# Patient Record
Sex: Female | Born: 1965
Health system: Southern US, Community
[De-identification: ages and names within clinical notes are randomized; demographics above are authoritative.]

## PROBLEM LIST (undated history)

## (undated) DIAGNOSIS — R002 Palpitations: Secondary | ICD-10-CM

## (undated) DIAGNOSIS — K219 Gastro-esophageal reflux disease without esophagitis: Secondary | ICD-10-CM

## (undated) DIAGNOSIS — G4733 Obstructive sleep apnea (adult) (pediatric): Secondary | ICD-10-CM

## (undated) DIAGNOSIS — E7800 Pure hypercholesterolemia, unspecified: Secondary | ICD-10-CM

## (undated) DIAGNOSIS — R0602 Shortness of breath: Secondary | ICD-10-CM

## (undated) DIAGNOSIS — I10 Essential (primary) hypertension: Secondary | ICD-10-CM

## (undated) DIAGNOSIS — R42 Dizziness and giddiness: Secondary | ICD-10-CM

## (undated) DIAGNOSIS — E669 Obesity, unspecified: Secondary | ICD-10-CM

## (undated) DIAGNOSIS — N200 Calculus of kidney: Secondary | ICD-10-CM

## (undated) DIAGNOSIS — Z87442 Personal history of urinary calculi: Secondary | ICD-10-CM

## (undated) DIAGNOSIS — G629 Polyneuropathy, unspecified: Secondary | ICD-10-CM

## (undated) HISTORY — DX: Obesity, unspecified: E66.9

## (undated) HISTORY — PX: OTHER SURGICAL HISTORY: SHX169

## (undated) HISTORY — DX: Palpitations: R00.2

## (undated) HISTORY — DX: Obstructive sleep apnea (adult) (pediatric): G47.33

---

## 1986-12-10 HISTORY — PX: OTHER SURGICAL HISTORY: SHX169

## 1990-12-10 HISTORY — PX: CHOLECYSTECTOMY: SHX55

## 2001-04-22 ENCOUNTER — Ambulatory Visit (HOSPITAL_COMMUNITY): Admission: RE | Admit: 2001-04-22 | Discharge: 2001-04-22 | Payer: Self-pay | Admitting: Orthopedic Surgery

## 2001-04-22 ENCOUNTER — Encounter: Payer: Self-pay | Admitting: Orthopedic Surgery

## 2001-12-19 ENCOUNTER — Other Ambulatory Visit: Admission: RE | Admit: 2001-12-19 | Discharge: 2001-12-19 | Payer: Self-pay | Admitting: Obstetrics and Gynecology

## 2001-12-22 ENCOUNTER — Encounter: Payer: Self-pay | Admitting: Obstetrics and Gynecology

## 2001-12-22 ENCOUNTER — Ambulatory Visit (HOSPITAL_COMMUNITY): Admission: RE | Admit: 2001-12-22 | Discharge: 2001-12-22 | Payer: Self-pay | Admitting: Obstetrics and Gynecology

## 2003-01-25 ENCOUNTER — Emergency Department (HOSPITAL_COMMUNITY): Admission: EM | Admit: 2003-01-25 | Discharge: 2003-01-25 | Payer: Self-pay | Admitting: *Deleted

## 2004-01-21 ENCOUNTER — Inpatient Hospital Stay (HOSPITAL_COMMUNITY): Admission: EM | Admit: 2004-01-21 | Discharge: 2004-01-22 | Payer: Self-pay | Admitting: Emergency Medicine

## 2004-04-03 ENCOUNTER — Observation Stay (HOSPITAL_COMMUNITY): Admission: EM | Admit: 2004-04-03 | Discharge: 2004-04-04 | Payer: Self-pay | Admitting: *Deleted

## 2004-04-21 ENCOUNTER — Ambulatory Visit (HOSPITAL_BASED_OUTPATIENT_CLINIC_OR_DEPARTMENT_OTHER): Admission: RE | Admit: 2004-04-21 | Discharge: 2004-04-21 | Payer: Self-pay | Admitting: Urology

## 2004-04-21 ENCOUNTER — Ambulatory Visit (HOSPITAL_COMMUNITY): Admission: RE | Admit: 2004-04-21 | Discharge: 2004-04-21 | Payer: Self-pay | Admitting: Urology

## 2004-04-27 ENCOUNTER — Ambulatory Visit (HOSPITAL_COMMUNITY): Admission: RE | Admit: 2004-04-27 | Discharge: 2004-04-27 | Payer: Self-pay | Admitting: Urology

## 2005-04-11 ENCOUNTER — Emergency Department (HOSPITAL_COMMUNITY): Admission: EM | Admit: 2005-04-11 | Discharge: 2005-04-11 | Payer: Self-pay | Admitting: Emergency Medicine

## 2005-06-13 ENCOUNTER — Emergency Department (HOSPITAL_COMMUNITY): Admission: EM | Admit: 2005-06-13 | Discharge: 2005-06-13 | Payer: Self-pay | Admitting: *Deleted

## 2005-06-15 ENCOUNTER — Ambulatory Visit (HOSPITAL_COMMUNITY): Admission: RE | Admit: 2005-06-15 | Discharge: 2005-06-15 | Payer: Self-pay | Admitting: Family Medicine

## 2005-07-04 ENCOUNTER — Inpatient Hospital Stay (HOSPITAL_COMMUNITY): Admission: AD | Admit: 2005-07-04 | Discharge: 2005-07-06 | Payer: Self-pay | Admitting: Neurosurgery

## 2005-08-29 ENCOUNTER — Ambulatory Visit (HOSPITAL_COMMUNITY): Admission: RE | Admit: 2005-08-29 | Discharge: 2005-08-29 | Payer: Self-pay | Admitting: Neurosurgery

## 2006-09-15 ENCOUNTER — Emergency Department (HOSPITAL_COMMUNITY): Admission: EM | Admit: 2006-09-15 | Discharge: 2006-09-16 | Payer: Self-pay | Admitting: Emergency Medicine

## 2007-03-09 ENCOUNTER — Emergency Department (HOSPITAL_COMMUNITY): Admission: EM | Admit: 2007-03-09 | Discharge: 2007-03-09 | Payer: Self-pay | Admitting: Emergency Medicine

## 2007-03-20 ENCOUNTER — Emergency Department (HOSPITAL_COMMUNITY): Admission: EM | Admit: 2007-03-20 | Discharge: 2007-03-20 | Payer: Self-pay | Admitting: Emergency Medicine

## 2007-05-23 LAB — CONVERTED CEMR LAB

## 2007-06-28 ENCOUNTER — Ambulatory Visit (HOSPITAL_COMMUNITY): Admission: RE | Admit: 2007-06-28 | Discharge: 2007-06-28 | Payer: Self-pay | Admitting: Emergency Medicine

## 2007-07-12 ENCOUNTER — Emergency Department (HOSPITAL_COMMUNITY): Admission: EM | Admit: 2007-07-12 | Discharge: 2007-07-12 | Payer: Self-pay | Admitting: Emergency Medicine

## 2007-10-11 ENCOUNTER — Emergency Department (HOSPITAL_COMMUNITY): Admission: EM | Admit: 2007-10-11 | Discharge: 2007-10-12 | Payer: Self-pay | Admitting: Emergency Medicine

## 2007-11-13 ENCOUNTER — Emergency Department (HOSPITAL_COMMUNITY): Admission: EM | Admit: 2007-11-13 | Discharge: 2007-11-13 | Payer: Self-pay | Admitting: Emergency Medicine

## 2007-11-13 ENCOUNTER — Encounter: Payer: Self-pay | Admitting: Orthopedic Surgery

## 2007-11-20 ENCOUNTER — Ambulatory Visit: Payer: Self-pay | Admitting: Orthopedic Surgery

## 2007-11-20 DIAGNOSIS — M5412 Radiculopathy, cervical region: Secondary | ICD-10-CM | POA: Insufficient documentation

## 2008-01-16 ENCOUNTER — Ambulatory Visit (HOSPITAL_COMMUNITY): Admission: RE | Admit: 2008-01-16 | Discharge: 2008-01-16 | Payer: Self-pay | Admitting: Internal Medicine

## 2008-02-24 ENCOUNTER — Encounter: Payer: Self-pay | Admitting: Orthopedic Surgery

## 2008-02-26 ENCOUNTER — Telehealth: Payer: Self-pay | Admitting: Orthopedic Surgery

## 2008-02-26 ENCOUNTER — Encounter: Payer: Self-pay | Admitting: Orthopedic Surgery

## 2008-02-28 ENCOUNTER — Ambulatory Visit: Admission: RE | Admit: 2008-02-28 | Discharge: 2008-02-28 | Payer: Self-pay | Admitting: Neurology

## 2008-03-08 ENCOUNTER — Ambulatory Visit: Payer: Self-pay | Admitting: Orthopedic Surgery

## 2008-03-24 ENCOUNTER — Ambulatory Visit (HOSPITAL_COMMUNITY): Admission: RE | Admit: 2008-03-24 | Discharge: 2008-03-24 | Payer: Self-pay | Admitting: Internal Medicine

## 2008-06-21 ENCOUNTER — Emergency Department (HOSPITAL_COMMUNITY): Admission: EM | Admit: 2008-06-21 | Discharge: 2008-06-21 | Payer: Self-pay | Admitting: Emergency Medicine

## 2008-08-31 ENCOUNTER — Ambulatory Visit: Payer: Self-pay | Admitting: Family Medicine

## 2008-08-31 DIAGNOSIS — N200 Calculus of kidney: Secondary | ICD-10-CM

## 2008-08-31 DIAGNOSIS — J309 Allergic rhinitis, unspecified: Secondary | ICD-10-CM | POA: Insufficient documentation

## 2008-08-31 DIAGNOSIS — Z6838 Body mass index (BMI) 38.0-38.9, adult: Secondary | ICD-10-CM | POA: Insufficient documentation

## 2008-08-31 DIAGNOSIS — E669 Obesity, unspecified: Secondary | ICD-10-CM

## 2008-08-31 DIAGNOSIS — R350 Frequency of micturition: Secondary | ICD-10-CM | POA: Insufficient documentation

## 2008-08-31 HISTORY — DX: Calculus of kidney: N20.0

## 2008-08-31 LAB — CONVERTED CEMR LAB
Ketones, urine, test strip: NEGATIVE
Protein, U semiquant: 30
Urobilinogen, UA: 0.2
pH: 5.5

## 2008-09-01 ENCOUNTER — Encounter (INDEPENDENT_AMBULATORY_CARE_PROVIDER_SITE_OTHER): Payer: Self-pay | Admitting: Family Medicine

## 2008-09-02 LAB — CONVERTED CEMR LAB
ALT: 14 units/L (ref 0–35)
AST: 13 units/L (ref 0–37)
Alkaline Phosphatase: 101 units/L (ref 39–117)
Basophils Absolute: 0 10*3/uL (ref 0.0–0.1)
Basophils Relative: 1 % (ref 0–1)
Chloride: 104 meq/L (ref 96–112)
Cholesterol: 185 mg/dL (ref 0–200)
Creatinine, Ser: 0.7 mg/dL (ref 0.40–1.20)
Eosinophils Absolute: 0.1 10*3/uL (ref 0.0–0.7)
Eosinophils Relative: 2 % (ref 0–5)
HCT: 39.3 % (ref 36.0–46.0)
Lymphocytes Relative: 19 % (ref 12–46)
Lymphs Abs: 1.5 10*3/uL (ref 0.7–4.0)
MCV: 86.6 fL (ref 78.0–100.0)
Potassium: 4.8 meq/L (ref 3.5–5.3)
RBC: 4.54 M/uL (ref 3.87–5.11)
TSH: 4.326 microintl units/mL (ref 0.350–4.50)
Total Bilirubin: 0.6 mg/dL (ref 0.3–1.2)
VLDL: 36 mg/dL (ref 0–40)
WBC: 8.1 10*3/uL (ref 4.0–10.5)

## 2008-09-08 ENCOUNTER — Encounter (INDEPENDENT_AMBULATORY_CARE_PROVIDER_SITE_OTHER): Payer: Self-pay | Admitting: Family Medicine

## 2008-09-14 ENCOUNTER — Ambulatory Visit: Payer: Self-pay | Admitting: Family Medicine

## 2008-09-17 ENCOUNTER — Telehealth (INDEPENDENT_AMBULATORY_CARE_PROVIDER_SITE_OTHER): Payer: Self-pay | Admitting: Family Medicine

## 2008-09-22 ENCOUNTER — Encounter (INDEPENDENT_AMBULATORY_CARE_PROVIDER_SITE_OTHER): Payer: Self-pay | Admitting: Family Medicine

## 2008-09-27 ENCOUNTER — Other Ambulatory Visit: Admission: RE | Admit: 2008-09-27 | Discharge: 2008-09-27 | Payer: Self-pay | Admitting: Obstetrics & Gynecology

## 2008-09-29 ENCOUNTER — Ambulatory Visit: Payer: Self-pay | Admitting: Family Medicine

## 2008-09-29 DIAGNOSIS — E782 Mixed hyperlipidemia: Secondary | ICD-10-CM | POA: Insufficient documentation

## 2008-09-29 DIAGNOSIS — E119 Type 2 diabetes mellitus without complications: Secondary | ICD-10-CM | POA: Insufficient documentation

## 2008-09-29 DIAGNOSIS — E785 Hyperlipidemia, unspecified: Secondary | ICD-10-CM | POA: Insufficient documentation

## 2008-09-29 LAB — CONVERTED CEMR LAB: Cholesterol, target level: 200 mg/dL

## 2008-10-01 ENCOUNTER — Ambulatory Visit (HOSPITAL_COMMUNITY): Admission: RE | Admit: 2008-10-01 | Discharge: 2008-10-01 | Payer: Self-pay | Admitting: Obstetrics & Gynecology

## 2008-10-04 ENCOUNTER — Telehealth (INDEPENDENT_AMBULATORY_CARE_PROVIDER_SITE_OTHER): Payer: Self-pay | Admitting: *Deleted

## 2008-10-06 ENCOUNTER — Telehealth (INDEPENDENT_AMBULATORY_CARE_PROVIDER_SITE_OTHER): Payer: Self-pay | Admitting: *Deleted

## 2008-10-11 ENCOUNTER — Ambulatory Visit (HOSPITAL_COMMUNITY): Admission: RE | Admit: 2008-10-11 | Discharge: 2008-10-11 | Payer: Self-pay | Admitting: Obstetrics & Gynecology

## 2008-10-13 ENCOUNTER — Ambulatory Visit: Payer: Self-pay | Admitting: Family Medicine

## 2008-10-21 ENCOUNTER — Ambulatory Visit: Payer: Self-pay | Admitting: Family Medicine

## 2008-10-29 ENCOUNTER — Ambulatory Visit: Payer: Self-pay | Admitting: Family Medicine

## 2008-10-29 DIAGNOSIS — F341 Dysthymic disorder: Secondary | ICD-10-CM | POA: Insufficient documentation

## 2008-10-29 LAB — CONVERTED CEMR LAB: Hgb A1c MFr Bld: 6.6 %

## 2008-11-12 ENCOUNTER — Encounter (INDEPENDENT_AMBULATORY_CARE_PROVIDER_SITE_OTHER): Payer: Self-pay | Admitting: Family Medicine

## 2009-04-11 ENCOUNTER — Ambulatory Visit: Payer: Self-pay | Admitting: Orthopedic Surgery

## 2009-04-15 ENCOUNTER — Telehealth: Payer: Self-pay | Admitting: Orthopedic Surgery

## 2009-04-15 ENCOUNTER — Ambulatory Visit: Payer: Self-pay | Admitting: Orthopedic Surgery

## 2009-04-15 ENCOUNTER — Ambulatory Visit (HOSPITAL_COMMUNITY): Admission: RE | Admit: 2009-04-15 | Discharge: 2009-04-15 | Payer: Self-pay | Admitting: Orthopedic Surgery

## 2009-04-18 ENCOUNTER — Telehealth: Payer: Self-pay | Admitting: Orthopedic Surgery

## 2009-04-18 ENCOUNTER — Encounter: Payer: Self-pay | Admitting: Orthopedic Surgery

## 2009-04-19 ENCOUNTER — Ambulatory Visit: Payer: Self-pay | Admitting: Orthopedic Surgery

## 2009-04-27 ENCOUNTER — Ambulatory Visit: Payer: Self-pay | Admitting: Orthopedic Surgery

## 2009-05-04 ENCOUNTER — Encounter: Payer: Self-pay | Admitting: Orthopedic Surgery

## 2009-05-04 ENCOUNTER — Encounter (HOSPITAL_COMMUNITY): Admission: RE | Admit: 2009-05-04 | Discharge: 2009-06-03 | Payer: Self-pay | Admitting: Orthopedic Surgery

## 2009-05-11 ENCOUNTER — Encounter: Payer: Self-pay | Admitting: Orthopedic Surgery

## 2009-05-12 ENCOUNTER — Ambulatory Visit (HOSPITAL_COMMUNITY): Admission: RE | Admit: 2009-05-12 | Discharge: 2009-05-12 | Payer: Self-pay | Admitting: Obstetrics and Gynecology

## 2009-05-23 ENCOUNTER — Ambulatory Visit: Payer: Self-pay | Admitting: Orthopedic Surgery

## 2009-06-01 ENCOUNTER — Emergency Department (HOSPITAL_COMMUNITY): Admission: EM | Admit: 2009-06-01 | Discharge: 2009-06-01 | Payer: Self-pay | Admitting: Emergency Medicine

## 2009-09-27 ENCOUNTER — Emergency Department (HOSPITAL_COMMUNITY): Admission: EM | Admit: 2009-09-27 | Discharge: 2009-09-27 | Payer: Self-pay | Admitting: Emergency Medicine

## 2009-11-23 ENCOUNTER — Ambulatory Visit (HOSPITAL_COMMUNITY): Admission: RE | Admit: 2009-11-23 | Discharge: 2009-11-23 | Payer: Self-pay | Admitting: Obstetrics and Gynecology

## 2010-06-22 ENCOUNTER — Emergency Department (HOSPITAL_COMMUNITY): Admission: EM | Admit: 2010-06-22 | Discharge: 2010-06-23 | Payer: Self-pay | Admitting: Emergency Medicine

## 2010-11-27 ENCOUNTER — Ambulatory Visit (HOSPITAL_COMMUNITY)
Admission: RE | Admit: 2010-11-27 | Discharge: 2010-11-27 | Payer: Self-pay | Source: Home / Self Care | Attending: Obstetrics and Gynecology | Admitting: Obstetrics and Gynecology

## 2010-12-14 ENCOUNTER — Emergency Department (HOSPITAL_COMMUNITY)
Admission: EM | Admit: 2010-12-14 | Discharge: 2010-12-14 | Payer: Self-pay | Source: Home / Self Care | Admitting: Emergency Medicine

## 2010-12-14 ENCOUNTER — Encounter: Payer: Self-pay | Admitting: Cardiology

## 2010-12-14 LAB — BASIC METABOLIC PANEL
BUN: 8 mg/dL (ref 6–23)
CO2: 25 mEq/L (ref 19–32)
Calcium: 8.4 mg/dL (ref 8.4–10.5)
Chloride: 104 mEq/L (ref 96–112)
Creatinine, Ser: 0.73 mg/dL (ref 0.4–1.2)
GFR calc Af Amer: >60 mL/min (ref >60)
GFR calc non Af Amer: >60 mL/min (ref >60)
Glucose, Bld: 138 mg/dL — ABNORMAL HIGH (ref 70–99)
Potassium: 3.7 mEq/L (ref 3.5–5.1)
Sodium: 137 mEq/L (ref 135–145)

## 2010-12-14 LAB — CBC
HCT: 34.6 % — ABNORMAL LOW (ref 36.0–46.0)
Hemoglobin: 11.7 g/dL — ABNORMAL LOW (ref 12.0–15.0)
MCH: 27.9 pg (ref 26.0–34.0)
MCHC: 33.8 g/dL (ref 30.0–36.0)
MCV: 82.4 fL (ref 78.0–100.0)
Platelets: 297 10*3/uL (ref 150–400)
RBC: 4.2 MIL/uL (ref 3.87–5.11)
RDW: 13.7 % (ref 11.5–15.5)
WBC: 6.4 10*3/uL (ref 4.0–10.5)

## 2010-12-14 LAB — POCT CARDIAC MARKERS
CKMB, poc: <1 ng/mL — ABNORMAL LOW (ref 1.0–8.0)
CKMB, poc: <1 ng/mL — ABNORMAL LOW (ref 1.0–8.0)
Myoglobin, poc: 35 ng/mL (ref 12–200)
Myoglobin, poc: 31 ng/mL (ref 12–200)
Troponin i, poc: <0.05 ng/mL (ref 0.00–0.09)
Troponin i, poc: <0.05 ng/mL (ref 0.00–0.09)

## 2010-12-14 LAB — DIFFERENTIAL
Basophils Absolute: 0 10*3/uL (ref 0.0–0.1)
Basophils Relative: 1 % (ref 0–1)
Eosinophils Absolute: 0.1 10*3/uL (ref 0.0–0.7)
Eosinophils Relative: 2 % (ref 0–5)
Lymphocytes Relative: 25 % (ref 12–46)
Lymphs Abs: 1.6 10*3/uL (ref 0.7–4.0)
Monocytes Absolute: 0.7 10*3/uL (ref 0.1–1.0)
Monocytes Relative: 10 % (ref 3–12)
Neutro Abs: 4 10*3/uL (ref 1.7–7.7)
Neutrophils Relative %: 62 % (ref 43–77)

## 2010-12-14 LAB — CONVERTED CEMR LAB
CO2: 25 meq/L
Creatinine, Ser: 0.73 mg/dL
HCT: 34.6 %
Sodium: 137 meq/L

## 2010-12-18 ENCOUNTER — Emergency Department (HOSPITAL_COMMUNITY)
Admission: EM | Admit: 2010-12-18 | Discharge: 2010-12-18 | Payer: Self-pay | Source: Home / Self Care | Admitting: Internal Medicine

## 2010-12-30 ENCOUNTER — Encounter: Payer: Self-pay | Admitting: Obstetrics and Gynecology

## 2010-12-31 ENCOUNTER — Encounter: Payer: Self-pay | Admitting: Family Medicine

## 2010-12-31 ENCOUNTER — Encounter: Payer: Self-pay | Admitting: Neurology

## 2010-12-31 ENCOUNTER — Encounter: Payer: Self-pay | Admitting: Obstetrics and Gynecology

## 2011-01-17 ENCOUNTER — Encounter (INDEPENDENT_AMBULATORY_CARE_PROVIDER_SITE_OTHER): Payer: Self-pay | Admitting: *Deleted

## 2011-01-17 ENCOUNTER — Ambulatory Visit (INDEPENDENT_AMBULATORY_CARE_PROVIDER_SITE_OTHER): Payer: PRIVATE HEALTH INSURANCE | Admitting: Cardiology

## 2011-01-17 ENCOUNTER — Encounter: Payer: Self-pay | Admitting: Cardiology

## 2011-01-17 DIAGNOSIS — R079 Chest pain, unspecified: Secondary | ICD-10-CM

## 2011-01-18 ENCOUNTER — Encounter: Payer: Self-pay | Admitting: Cardiology

## 2011-01-25 NOTE — Letter (Signed)
Summary: Stress Echocardiogram Information Sheet  [facility] at [facility]  [address], [place] [address]   Phone: [phone]  Fax: [phone]      January 17, 2011 MRN: [id number] light prior to the test.   [name]  Doctor: Appointment Date: Appointment Time: Appointment Location: [facility]  Stress Echocardiogram Information Sheet    Instructions:   1. DO NOT  take your AM medicine THE MORNING  before the test.  2. NOTHING TO EAT OR DRINK AFTER MIDNIGHT  3. Dress prepared to exercise.  4. DO NOT use ANY caffine or tobacco products 3 hours before appointment.  5. Report to the Short Stay Center on the1st floor.  6. Please bring all current prescription medications.  7. If you have any questions, please call [phone]

## 2011-01-31 NOTE — Assessment & Plan Note (Signed)
Summary: [facility]   Visit Type:  Initial Consult Primary Provider:  Dr. Marquis Alvarado   History of Present Illness: [name] is seen in the office at the kind request of Dr. Marquis Alvarado for evaluation of chest discomfort.  This nice woman has enjoyed generally good health.  She has experienced chest discomfort severe enough to prompt evaluation in the emergency department on at least 2 occasions.  Myocardial infarction was ruled out.  She has not previously been referred to a cardiologist nor undergone any significant cardiac testing.    Prior records were obtained and reviewed.  She has multiple emergency department visits, some for chest discomfort, some for abdominal discomfort and some for headache.  No significant pathology has been apparent at any of her visits.  Her episodes of chest discomfort have been unassociated with exertion.  She develops the sudden onset of severe anterior chest pain with radiation to the legs and arm and associated dyspnea, diaphoresis and lightheadedness.  Minimal nausea has been present.  Symptoms had not resolved at the time of her most recent presentation to the emergency department.  Current Medications (verified): 1)  Metformin Hcl 500 Mg Tabs (Metformin Hcl) .... Take 1 Tab Two Times A Day  Allergies (verified): 1)  Codeine 2)  Penicillin 3)  Sulfa 4)  Morphine  Comments:  Nurse/Medical Assistant: patient is only on 1 med metfromin 500 mg two times a day [facility] is patients pharmacy  Past History:  Family History: Last updated: 01/17/2011 Father-71-A&W Mother-70-DM, HTN, hx breast cancer; cardiac disease Siblings: One sister who is alive and well at age 52 Children: 75 year old son and 32-year-old daughter, both healthy  Family history of diabetes and coronary artery disease  Social History: Last updated: 01/17/2011 [employer]; adm. tasks in a warehouse Single-lives with 2 children Never  Smoked Alcohol use-no Drug use-no Marital-divorced  Past Medical History: Chest pain Diabetes-2011 Obesity with abnormal sleep study Nephrolithiasis Anxiety Headache ALLERGIC RHINITIS (ICD-477.9) CERVICAL RADICULITIS (ICD-723.4)  Past Surgical History: C-Section x2-1994/2000 Cholecystectomy Removal of bone spur from right foot-1998 Sinus surgery-1993 - hx of sinus infections Lithotripsy for nephrolithiasis -2008  Left carpal tunnel release-04/2009 Conization of cervix for dysplasia  Sleep Study  Procedure date:  02/28/2008  Findings:       IMPRESSION:  Mild rapid eye movement-related obstructive sleep apnea syndrome, but with moderate desaturation.      RECOMMENDATIONS:  Nocturnal oxygen 2 liters.            [name], M.D.   CT Abdomen/Pelvis  Procedure date:  03/24/2008  Findings:       Clinical Data:  Right-sided abdominal and pelvic pain.  Nausea.   Prior cholecystectomy. IMPRESSION:   7 mm calculus at the right ureteropelvic junction, causing mild   right hydronephrosis. Stable small periumbilical hernia containing only fat.  No acute   findings within pelvis.    CT of Chest  Procedure date:  01/16/2008  Findings:         Normal appearance of aorta on precontrast images.   Prior cholecystectomy.   Tiny duodenal diverticulum.   No thoracic adenopathy or acute bone findings.    Following contrast, normal aortic enhancement identified without   aneurysm or dissection.   Pulmonary arteries patent.   Minimal residual thymic tissue anterior mediastinum.   Minimal peribronchial thickening.   Lungs otherwise clear.   No pleural effusion or pneumothorax.    IMPRESSION:   Negative CT chest.    Read  By:  [name],  M.D.  EKG  Procedure date:  01/17/2011  Findings:      Normal sinus rhythm Within normal limits No previous tracing for comparison.  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  Vital Signs:  Patient profile:   45 year old female Height:      60 inches Weight:      215 pounds BMI:     42.14 O2 Sat:      96 % on Room air Pulse rate:   84 / minute BP sitting:   117 / 66  (left arm)  Vitals Entered By: [name] Saa, CNA (January 17, 2011 2:09 PM)  Nutrition Counseling: Patient's BMI is greater than 25 and therefore counseled on weight management options.  O2 Flow:  Room air  Physical Exam  General:  Obese; well-developed; no acute distress: HEENT-[name]/AT; PERRL; EOM intact; conjunctiva and lids nl:  Neck-No JVD; no carotid bruits: Endocrine-No thyromegaly: Lungs-No tachypnea, clear without rales, rhonchi or wheezes: CV-normal PMI; normal S1 and S2; modest basilar systolic ejection murmur Abdomen-BS normal; soft and non-tender without masses or organomegaly: MS-No deformities, cyanosis or clubbing: Neurologic-Nl cranial nerves; symmetric strength and tone: Skin- Warm, no sig. lesions: Extremities-Nl distal pulses; no edema    Impression & Recommendations:  Problem # 1:  CHEST PAIN (ICD-786.50) Chest discomfort is atypical with a pattern of infrequent but intense episodes.  The  patient also describes some residual aching that occurs more commonly.  I doubt that these symptoms represents coronary disease in this premenopausal woman, but we will proceed with a stress echocardiogram.  Problem # 2:  HYPERLIPIDEMIA (ICD-272.4) Lipid profile obtained a few years ago shows no substantial hyperlipidemia; however, in the presence of newly diagnosed diabetes, treatment with a statin would be reasonable.  CHOL: 185 (09/01/2008)   LDL: 105 (09/01/2008)   HDL: 44 (09/01/2008)   TG: 182 (09/01/2008)  Problem # 3:  OBESITY (ICD-278.00) Bariatric surgery discussed with patient.  She is not interested in that approach to obesity at this time.  Other Orders: Stress Echo (Stress Echo)  Patient Instructions: 1)  Your physician recommends that you schedule a follow-up appointment in: 3 MONTHS 2)  Your physician has recommended you make the following change in your medication: NITROGLYCERIN 0.4MG  UNDER TONGUE EVERY 5 MINUTES X3 FOR CHEST PAIN 3)  Your physician has requested that you have a stress echocardiogram. For further information please visit [email or web address]  Please follow instruction sheet as given. Prescriptions: NITROSTAT 0.4 MG SUBL (NITROGLYCERIN) 1 tablet under tongue at onset of chest  pain; you may repeat every 5 minutes for up to 3 doses.  #25 x 3   Entered by:   Teressa Lower RN   Authorized by:   Kathlen Brunswick, MD, [facility]   Signed by:   Teressa Lower RN on 01/17/2011   Method used:   Electronically to        [facility] (retail)       [address]       [place], [place]  [address]       Ph: [phone] or [phone]       Fax: [phone]   RxID:   [id number]

## 2011-02-06 ENCOUNTER — Ambulatory Visit (HOSPITAL_COMMUNITY)
Admission: RE | Admit: 2011-02-06 | Discharge: 2011-02-06 | Disposition: A | Discharge status: Home / Self Care | Payer: PRIVATE HEALTH INSURANCE | Source: Ambulatory Visit | Attending: Cardiology | Admitting: Cardiology

## 2011-02-06 ENCOUNTER — Ambulatory Visit (HOSPITAL_COMMUNITY): Payer: PRIVATE HEALTH INSURANCE | Attending: Cardiology

## 2011-02-06 ENCOUNTER — Encounter: Payer: Self-pay | Admitting: Cardiology

## 2011-02-06 DIAGNOSIS — R079 Chest pain, unspecified: Secondary | ICD-10-CM | POA: Insufficient documentation

## 2011-02-06 DIAGNOSIS — R0609 Other forms of dyspnea: Secondary | ICD-10-CM | POA: Insufficient documentation

## 2011-02-06 DIAGNOSIS — R0989 Other specified symptoms and signs involving the circulatory and respiratory systems: Secondary | ICD-10-CM | POA: Insufficient documentation

## 2011-02-06 DIAGNOSIS — R072 Precordial pain: Secondary | ICD-10-CM

## 2011-02-09 ENCOUNTER — Telehealth (INDEPENDENT_AMBULATORY_CARE_PROVIDER_SITE_OTHER): Payer: Self-pay | Admitting: *Deleted

## 2011-02-15 ENCOUNTER — Telehealth (INDEPENDENT_AMBULATORY_CARE_PROVIDER_SITE_OTHER): Payer: Self-pay | Admitting: *Deleted

## 2011-02-20 NOTE — Progress Notes (Signed)
Summary: test results  Phone Note Call from Patient Call back at Home Phone [phone]   Caller: pt Reason for Call: Talk to Nurse, Lab or Test Results Summary of Call: pt has test done last week calling for results Initial call taken by: Faythe Ghee,  February 15, 2011 4:27 PM  Follow-up for Phone Call        Results called to pt, she verbalized understanding of results Follow-up by: Teressa Lower RN,  February 15, 2011 4:58 PM

## 2011-02-20 NOTE — Progress Notes (Signed)
Summary: stress echo results  Phone Note Other Incoming   Caller: patient Reason for Call: Discuss lab or test results Summary of Call: patient called and would like a phone call back about her stress echo results thankyou Diala Waxman phone number is [phone] dob is [birth year] Initial call taken by: Dreama Saa, CNA,  February 09, 2011 8:16 AM  Follow-up for Phone Call        Pt. advised that Dr. Dietrich Pates has not seen report yet. Follow-up by: Larita Fife Via LPN,  February 09, 2011 10:13 AM

## 2011-02-24 LAB — URINE MICROSCOPIC-ADD ON

## 2011-02-24 LAB — URINALYSIS, ROUTINE W REFLEX MICROSCOPIC
Nitrite: POSITIVE — AB
Specific Gravity, Urine: 1.02 (ref 1.005–1.030)
pH: 6 (ref 5.0–8.0)

## 2011-03-19 LAB — URINE MICROSCOPIC-ADD ON

## 2011-03-19 LAB — URINALYSIS, ROUTINE W REFLEX MICROSCOPIC
Bilirubin Urine: NEGATIVE
Glucose, UA: NEGATIVE mg/dL
Ketones, ur: NEGATIVE mg/dL
Specific Gravity, Urine: 1.025 (ref 1.005–1.030)
pH: 6 (ref 5.0–8.0)

## 2011-03-20 LAB — BASIC METABOLIC PANEL
Chloride: 105 mEq/L (ref 96–112)
Creatinine, Ser: 0.63 mg/dL (ref 0.4–1.2)
GFR calc Af Amer: >60 mL/min (ref >60)
GFR calc non Af Amer: >60 mL/min (ref >60)
Potassium: 4.3 mEq/L (ref 3.5–5.1)

## 2011-03-20 LAB — HEMOGLOBIN AND HEMATOCRIT, BLOOD: Hemoglobin: 12.1 g/dL (ref 12.0–15.0)

## 2011-03-20 LAB — GLUCOSE, CAPILLARY: Glucose-Capillary: 116 mg/dL — ABNORMAL HIGH (ref 70–99)

## 2011-03-20 LAB — HCG, QUANTITATIVE, PREGNANCY: hCG, Beta Chain, Quant, S: <2 m[IU]/mL (ref <5)

## 2011-04-24 NOTE — Op Note (Signed)
[name], DROGE NO.:  [id number]   MEDICAL RECORD NO.:  [id number]          PATIENT TYPE:  AMB   LOCATION:  DAY                           FACILITY:  [facility]   PHYSICIAN:  Vickki Hearing, M.D.DATE OF BIRTH:  1966-11-16   DATE OF PROCEDURE:  04/15/2009  DATE OF DISCHARGE:                               OPERATIVE REPORT   HISTORY:  This is a 45 year old female, who has had carpal tunnel  syndrome for several years actually and had a positive carpal tunnel  test in March 2009.  She was treated with Advil, Neurontin, and vitamin  B6; and was lost to follow up for you but presents back with increasing  symptoms with and wishes to have carpal tunnel release.   PREOPERATIVE DIAGNOSIS:  Left carpal tunnel syndrome.   POSTOPERATIVE DIAGNOSIS:  Left carpal tunnel syndrome.   PROCEDURE:  Left carpal tunnel release.   SURGEON:  Vickki Hearing, MD   There were no assistants.   ANESTHESIA:  Bier block.   OPERATIVE FINDINGS:  Compressed, discolored, gray-appearing median  nerve.  No lesions in the carpal tunnel.   COMPLICATIONS:  None.   COUNTS:  Correct.   The patient was in stable condition at the end of procedure.   PROCEDURE IN DETAIL:  Procedure was done as follows.  The patient was  identified in the preop area.  Site marking and chart update were  performed.   She was taken to surgery, had a Bier block.   She had her left upper extremity prepped with DuraPrep and draped  sterilely.  Time-out procedure was then completed.   A midline incision was made in line with the radial border of the ring  finger.  Subcutaneous tissue was divided.  Palmar fascia was then  bluntly dissected and divided.  Distal portion of the carpal tunnel was  identified and a blunt instrument was passed beneath the carpal tunnel  and then a sharp instrument was used to release the transverse carpal  ligament.  The contents of the carpal tunnel were inspected.  The  wound  was irrigated and closed with 3-0 nylon suture, then injected with 0.5%  plain Marcaine with no epinephrine.  Sterile bandage was applied.  Tourniquet was released.  Fingers looked very good with good color and  capillary refill.   The patient was discharged to the PACU.  A followup is scheduled for  Tuesday.   Discharged with Darvocet-N 100 one q.4 p.r.n. for pain.      Vickki Hearing, M.D.  Electronically Signed     SEH/MEDQ  D:  04/15/2009  T:  04/16/2009  Job:  [id number]

## 2011-04-24 NOTE — Procedures (Signed)
[name], [name]            ACCOUNT NO.:  [id number]   MEDICAL RECORD NO.:  [id number]          PATIENT TYPE:  OUT   LOCATION:  SLEE                          FACILITY:  [facility]   PHYSICIAN:  Kofi A. Gerilyn Pilgrim, M.D. DATE OF BIRTH:  [birth year]   DATE OF PROCEDURE:  02/28/2008  DATE OF DISCHARGE:  02/28/2008                             SLEEP DISORDER REPORT   PROCEDURE:  Polysomnography.   OPERATOR:  Dr. Darleen Crocker A. [place].   INDICATIONS FOR PROCEDURE:  A 45 year old who presents with snoring,  hypersomnia and is being evaluated for possible obstructive sleep apnea  syndrome.   MEDICATIONS:  1. Xanax.  2. Neurontin.  3. Butalbital.   EPWORTH SLEEPINESS SCALE:  Is 13.  BMI is 32.   SLEEP STAGES:  Total recording time of 405 minutes.  Sleep efficiency  92.8%.  Sleep latency 11.5.  REM latency 102 minutes.  Stage I:  6%.  Stage II:  62%  Stage III:  28%.  REM sleep:  23%.   RESPIRATORY SUMMARY:  Baseline oxygen saturation 98%.  Lowest  desaturation 72% during REM.  Lowest non-REM saturation 83%.  The  patient had an AHI of 9.  The events occurred almost exclusively during  REM sleep with a REM AHI of 33.  Non-REM AHI is 0.4.   LIMB MOVEMENT SUMMARY:  No nocturnal leg jerks observed.   EKG SUMMARY:  Average heart rate 75.   IMPRESSION:  Mild rapid eye movement-related obstructive sleep apnea  syndrome, but with moderate desaturation.   RECOMMENDATIONS:  Nocturnal oxygen 2 liters.     Kofi A. Gerilyn Pilgrim, M.D.  Electronically Signed    KAD/MEDQ  D:  03/02/2008  T:  03/03/2008  Job:  [id number]

## 2011-04-27 NOTE — Group Therapy Note (Signed)
[name], [name]                           ACCOUNT NO.:  [id number]   MEDICAL RECORD NO.:  [id number]                   PATIENT TYPE:  INP   LOCATION:  A320                                 FACILITY:  [facility]   PHYSICIAN:  Scott A. Gerda Diss, M.D.               DATE OF BIRTH:  02-02-1966   DATE OF PROCEDURE:  01/22/2004  DATE OF DISCHARGE:                                   PROGRESS NOTE   SUMMARY:  The patient overall doing a little bit better but she states she  is not doing better.  She certainly looks comfortable, does not appear in  any severe distress.  Lungs are clear, heart is regular, abdomen is soft, right-side flank  tenderness.   ASSESSMENT AND PLAN:  Right flank pain - CT scan did not show any  hydronephrosis.  I really feel the patient should be able to be treated at  home with outpatient follow-up with urology but will have urology go ahead  and see the patient today.  Await their findings.      ___________________________________________                                            Jonna Coup Gerda Diss, M.D.   SAL/MEDQ  D:  01/22/2004  T:  01/22/2004  Job:  [id number]

## 2011-04-27 NOTE — H&P (Signed)
[name], [name]                           ACCOUNT NO.:  [id number]   MEDICAL RECORD NO.:  [id number]                   PATIENT TYPE:  INP   LOCATION:  A320                                 FACILITY:  [facility]   PHYSICIAN:  Scott A. Gerda Diss, M.D.               DATE OF BIRTH:  03-31-1966   DATE OF ADMISSION:  01/21/2004  DATE OF DISCHARGE:                                HISTORY & PHYSICAL   CHIEF COMPLAINT:  Right flank pain.   HISTORY OF PRESENT ILLNESS:  A 45 year old white female, presents with  sudden onset of severe right flank pain and discomfort with nausea.  It  happened at 3 a.m. on the 11th.  Was unable to get comfortable.  Denied  breaking out in a sweat.  Denied vomiting.  No diarrhea, no dysuria.  Patient states the pain became worse as the day went on and she presented to  the emergency department.  She denied any vaginal bleeding.   PAST MEDICAL/SURGICAL HISTORY:  1. Borderline diabetes.  2. Previous sinus surgery.  3. Foot surgery.  4. C-sections.   SOCIAL HISTORY:  Divorced, two children.  Patient does not smoke or drink.   FAMILY HISTORY:  Breast cancer, hypertension, diabetes.   ALLERGIES:  CODEINE causes disorientation.  SULFA causes rash.   CURRENT MEDICATIONS:  Over-the-counter medications only.   REVIEW OF SYSTEMS:  Negative for headaches, blurred vision, sore throat,  cough.  Negative for joint discomfort, rash.  Negative for swelling in the  legs.   LABORATORY DATA:  White count 9.4, hemoglobin 13.3, neutrophils 72.  Sodium  137, potassium 4.0, glucose 116.  BUN 9, creatinine 0.6.  Urinalysis  negative for color, just being yellow, HCG negative, specific gravity 1.030,  blood was moderate, RBC's 11 and 20, bacteria, many, few WBC's.   STUDIES:  CT scan of the abdomen showed bilateral renal calculi, 7 mm  diameter on the right side without hydronephrosis.  Small one, 5 mm, on the  left side.  Normal appendix.  Slightly prominent right ovary versus  left.  Ultrasound showed a simple cyst on the right side.   PHYSICAL EXAMINATION:  HEENT:  Benign.  NECK:  No masses, supple.  CHEST:  Clear to percussion, auscultation.  HEART:  Regular.  ABDOMEN:  Soft.  Moderate right flank tenderness.  No guarding or rebound.  EXTREMITIES:  No edema.  NEUROLOGIC:  Grossly normal.   ASSESSMENT/PLAN:  Right flank pain-patient was given Toradol in the ER.  The  patient states her pain was severe, very uncomfortable.  Was given IV  Dilaudid.  Was admitted for IV fluids.  Will consult urology.     ___________________________________________                                         Lorin Picket  A. Gerda Diss, M.D.   SAL/MEDQ  D:  01/22/2004  T:  01/22/2004  Job:  [id number]

## 2011-04-27 NOTE — Discharge Summary (Signed)
[name], [name]            ACCOUNT NO.:  [id number]   MEDICAL RECORD NO.:  [id number]          PATIENT TYPE:  OIB   LOCATION:  3018                         FACILITY:  [facility]   PHYSICIAN:  Coletta Memos, M.D.     DATE OF BIRTH:  1966/08/09   DATE OF ADMISSION:  07/04/2005  DATE OF DISCHARGE:  07/06/2005                                 DISCHARGE SUMMARY   ADMISSION DIAGNOSIS:  Displaced disk, right L4-5.   PROCEDURE:  Right L4-5 semi-hemilaminectomy and diskectomy with micro  diskectomy. Surgeon, Coletta Memos, M.D.   COMPLICATIONS:  None.   DISCHARGE STATUS:  Alive and well.   DISCHARGE DESTINATION:  Home.   MEDICATIONS:  1.  Decadron taper 4 mg p.o. b.i.d. for two days; 4 mg p.o. daily for two      days.  2.  Flexeril for muscle spasms and Vicodin for pain one to two p.o. q.6h.      p.r.n. pain.   INDICATIONS:  [name] is a 45 year old female who presented with  pain in the right lower extremity. She had a herniated disk at L4-5 on the  right side. She was admitted, taken to the operating room on July 04, 2005,  and underwent an uncomplicated right L4-5 semi-hemilaminectomy and  diskectomy. Postoperatively, she had a significant amount of pain and did  not feel that she would be able to tolerate this at home. She was  ambulating, voiding, but ambulating with severe pain. I therefore left here  in the hospital and she has improved over two days. She received Decadron  IV. At discharge her wound is clean, dry without signs of infection. She is  doing well, tolerating a regular diet. She was given no instructions for no  heavy lifting, bending, or twisting. She is instructed to call our office  for a routine appointment.       KC/MEDQ  D:  07/06/2005  T:  07/07/2005  Job:  [id number]
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
## 2011-05-08 ENCOUNTER — Emergency Department (HOSPITAL_COMMUNITY): Payer: PRIVATE HEALTH INSURANCE

## 2011-05-08 ENCOUNTER — Other Ambulatory Visit (HOSPITAL_COMMUNITY): Payer: Self-pay | Admitting: "Endocrinology

## 2011-05-08 ENCOUNTER — Emergency Department (HOSPITAL_COMMUNITY)
Admission: EM | Admit: 2011-05-08 | Discharge: 2011-05-08 | Disposition: A | Discharge status: Home / Self Care | Payer: PRIVATE HEALTH INSURANCE | Attending: Emergency Medicine | Admitting: Emergency Medicine

## 2011-05-08 DIAGNOSIS — R911 Solitary pulmonary nodule: Secondary | ICD-10-CM

## 2011-05-08 DIAGNOSIS — R071 Chest pain on breathing: Secondary | ICD-10-CM | POA: Insufficient documentation

## 2011-05-08 DIAGNOSIS — R0789 Other chest pain: Secondary | ICD-10-CM | POA: Insufficient documentation

## 2011-05-08 DIAGNOSIS — E119 Type 2 diabetes mellitus without complications: Secondary | ICD-10-CM | POA: Insufficient documentation

## 2011-05-08 DIAGNOSIS — E876 Hypokalemia: Secondary | ICD-10-CM | POA: Insufficient documentation

## 2011-05-08 LAB — BASIC METABOLIC PANEL
BUN: 7 mg/dL (ref 6–23)
CO2: 28 mEq/L (ref 19–32)
Chloride: 100 mEq/L (ref 96–112)
Creatinine, Ser: 0.56 mg/dL (ref 0.4–1.2)
Glucose, Bld: 204 mg/dL — ABNORMAL HIGH (ref 70–99)
Potassium: 3.3 mEq/L — ABNORMAL LOW (ref 3.5–5.1)

## 2011-05-08 LAB — CBC
MCH: 26.9 pg (ref 26.0–34.0)
MCV: 81.4 fL (ref 78.0–100.0)
Platelets: 318 10*3/uL (ref 150–400)
RBC: 4.2 MIL/uL (ref 3.87–5.11)
RDW: 14.2 % (ref 11.5–15.5)

## 2011-05-08 LAB — TROPONIN I
Troponin I: <0.3 ng/mL (ref <0.30)
Troponin I: <0.3 ng/mL (ref <0.30)

## 2011-05-08 LAB — DIFFERENTIAL
Basophils Relative: 0 % (ref 0–1)
Eosinophils Absolute: 0.2 10*3/uL (ref 0.0–0.7)
Eosinophils Relative: 3 % (ref 0–5)
Lymphs Abs: 2 10*3/uL (ref 0.7–4.0)
Monocytes Relative: 8 % (ref 3–12)
Neutrophils Relative %: 66 % (ref 43–77)

## 2011-05-08 LAB — CK TOTAL AND CKMB (NOT AT ARMC)
Relative Index: INVALID (ref 0.0–2.5)
Relative Index: INVALID (ref 0.0–2.5)

## 2011-05-10 ENCOUNTER — Ambulatory Visit (HOSPITAL_COMMUNITY)
Admission: RE | Admit: 2011-05-10 | Discharge: 2011-05-10 | Disposition: A | Discharge status: Home / Self Care | Payer: PRIVATE HEALTH INSURANCE | Source: Ambulatory Visit | Attending: "Endocrinology | Admitting: "Endocrinology

## 2011-05-10 ENCOUNTER — Encounter (HOSPITAL_COMMUNITY): Payer: Self-pay

## 2011-05-10 DIAGNOSIS — J984 Other disorders of lung: Secondary | ICD-10-CM | POA: Insufficient documentation

## 2011-05-10 DIAGNOSIS — E119 Type 2 diabetes mellitus without complications: Secondary | ICD-10-CM | POA: Insufficient documentation

## 2011-05-10 DIAGNOSIS — R911 Solitary pulmonary nodule: Secondary | ICD-10-CM

## 2011-05-10 MED ORDER — IOHEXOL 300 MG/ML  SOLN
80.0000 mL | Freq: Once | INTRAMUSCULAR | Status: AC | PRN
  Administered 2011-05-10: 80 mL via INTRAVENOUS

## 2011-09-06 LAB — URINALYSIS, ROUTINE W REFLEX MICROSCOPIC
Bilirubin Urine: NEGATIVE
Leukocytes, UA: NEGATIVE
Nitrite: NEGATIVE
Specific Gravity, Urine: <1.005 — ABNORMAL LOW
Urobilinogen, UA: 0.2
pH: 5

## 2011-09-06 LAB — URINE MICROSCOPIC-ADD ON

## 2011-09-06 LAB — PREGNANCY, URINE: Preg Test, Ur: NEGATIVE

## 2011-09-18 LAB — DIFFERENTIAL
Basophils Absolute: 0.1
Basophils Relative: 1
Eosinophils Absolute: 0.1
Monocytes Relative: 8
Neutro Abs: 6.9
Neutrophils Relative %: 61

## 2011-09-18 LAB — COMPREHENSIVE METABOLIC PANEL
ALT: 17
Alkaline Phosphatase: 91
BUN: 8
CO2: 25
Calcium: 8.6
GFR calc non Af Amer: >60
Glucose, Bld: 140 — ABNORMAL HIGH
Potassium: 3.3 — ABNORMAL LOW
Sodium: 135
Total Protein: 6

## 2011-09-18 LAB — CBC
HCT: 34 — ABNORMAL LOW
Hemoglobin: 11.6 — ABNORMAL LOW
Platelets: 308
WBC: 11.3 — ABNORMAL HIGH

## 2011-09-18 LAB — MAGNESIUM: Magnesium: 2.2

## 2011-09-18 LAB — D-DIMER, QUANTITATIVE: D-Dimer, Quant: 0.35

## 2011-09-18 LAB — POCT CARDIAC MARKERS
CKMB, poc: 1.3
Operator id: 217071
Troponin i, poc: <0.05

## 2011-09-24 LAB — DIFFERENTIAL
Basophils Absolute: 0.1
Basophils Relative: 1
Eosinophils Relative: 2
Monocytes Absolute: 0.9 — ABNORMAL HIGH
Neutro Abs: 6.4

## 2011-09-24 LAB — URINE MICROSCOPIC-ADD ON

## 2011-09-24 LAB — URINALYSIS, ROUTINE W REFLEX MICROSCOPIC
Bilirubin Urine: NEGATIVE
Leukocytes, UA: NEGATIVE
Nitrite: NEGATIVE
Specific Gravity, Urine: 1.015
Urobilinogen, UA: 0.2
pH: 6

## 2011-09-24 LAB — HEPATIC FUNCTION PANEL
AST: 19
Bilirubin, Direct: 0.1
Indirect Bilirubin: 0.7
Total Protein: 6.5

## 2011-09-24 LAB — BASIC METABOLIC PANEL
BUN: 10
CO2: 27
Calcium: 8.3 — ABNORMAL LOW
GFR calc non Af Amer: >60
Glucose, Bld: 94
Sodium: 134 — ABNORMAL LOW

## 2011-09-24 LAB — CBC
Hemoglobin: 12.2
MCHC: 34.4
Platelets: 337
RDW: 13.6

## 2011-10-24 ENCOUNTER — Other Ambulatory Visit (HOSPITAL_COMMUNITY): Payer: Self-pay | Admitting: Oncology

## 2011-10-24 ENCOUNTER — Other Ambulatory Visit (HOSPITAL_COMMUNITY): Payer: Self-pay | Admitting: "Endocrinology

## 2011-10-24 DIAGNOSIS — Z139 Encounter for screening, unspecified: Secondary | ICD-10-CM

## 2011-10-30 ENCOUNTER — Other Ambulatory Visit (HOSPITAL_COMMUNITY): Payer: Self-pay | Admitting: "Endocrinology

## 2011-10-30 DIAGNOSIS — R911 Solitary pulmonary nodule: Secondary | ICD-10-CM

## 2011-11-05 ENCOUNTER — Ambulatory Visit (HOSPITAL_COMMUNITY)
Admission: RE | Admit: 2011-11-05 | Discharge: 2011-11-05 | Disposition: A | Discharge status: Home / Self Care | Payer: Self-pay | Source: Ambulatory Visit | Attending: "Endocrinology | Admitting: "Endocrinology

## 2011-11-05 ENCOUNTER — Encounter (HOSPITAL_COMMUNITY): Payer: Self-pay

## 2011-11-05 DIAGNOSIS — R911 Solitary pulmonary nodule: Secondary | ICD-10-CM

## 2011-11-05 DIAGNOSIS — R079 Chest pain, unspecified: Secondary | ICD-10-CM | POA: Insufficient documentation

## 2011-11-05 MED ORDER — IOHEXOL 300 MG/ML  SOLN
80.0000 mL | Freq: Once | INTRAMUSCULAR | Status: AC | PRN
  Administered 2011-11-05: 80 mL via INTRAVENOUS

## 2011-11-08 ENCOUNTER — Other Ambulatory Visit (HOSPITAL_COMMUNITY): Payer: Self-pay | Admitting: "Endocrinology

## 2011-11-08 ENCOUNTER — Ambulatory Visit (HOSPITAL_COMMUNITY)
Admission: RE | Admit: 2011-11-08 | Discharge: 2011-11-08 | Disposition: A | Discharge status: Home / Self Care | Payer: Self-pay | Source: Ambulatory Visit | Attending: "Endocrinology | Admitting: "Endocrinology

## 2011-11-08 DIAGNOSIS — R52 Pain, unspecified: Secondary | ICD-10-CM

## 2011-11-08 DIAGNOSIS — M7730 Calcaneal spur, unspecified foot: Secondary | ICD-10-CM | POA: Insufficient documentation

## 2011-11-08 DIAGNOSIS — M25579 Pain in unspecified ankle and joints of unspecified foot: Secondary | ICD-10-CM | POA: Insufficient documentation

## 2011-11-13 ENCOUNTER — Emergency Department (HOSPITAL_COMMUNITY)
Admission: EM | Admit: 2011-11-13 | Discharge: 2011-11-14 | Disposition: A | Discharge status: Home / Self Care | Payer: Self-pay | Attending: Emergency Medicine | Admitting: Emergency Medicine

## 2011-11-13 ENCOUNTER — Encounter (HOSPITAL_COMMUNITY): Payer: Self-pay | Admitting: *Deleted

## 2011-11-13 ENCOUNTER — Emergency Department (HOSPITAL_COMMUNITY): Payer: Self-pay

## 2011-11-13 DIAGNOSIS — E119 Type 2 diabetes mellitus without complications: Secondary | ICD-10-CM | POA: Insufficient documentation

## 2011-11-13 DIAGNOSIS — R1031 Right lower quadrant pain: Secondary | ICD-10-CM | POA: Insufficient documentation

## 2011-11-13 DIAGNOSIS — N390 Urinary tract infection, site not specified: Secondary | ICD-10-CM | POA: Insufficient documentation

## 2011-11-13 LAB — PREGNANCY, URINE: Preg Test, Ur: NEGATIVE

## 2011-11-13 LAB — DIFFERENTIAL
Basophils Absolute: 0.1 10*3/uL (ref 0.0–0.1)
Basophils Relative: 1 % (ref 0–1)
Monocytes Absolute: 0.9 10*3/uL (ref 0.1–1.0)
Neutro Abs: 6.1 10*3/uL (ref 1.7–7.7)
Neutrophils Relative %: 63 % (ref 43–77)

## 2011-11-13 LAB — COMPREHENSIVE METABOLIC PANEL
AST: 15 U/L (ref 0–37)
Albumin: 3.1 g/dL — ABNORMAL LOW (ref 3.5–5.2)
Chloride: 100 mEq/L (ref 96–112)
Creatinine, Ser: 0.64 mg/dL (ref 0.50–1.10)
Potassium: 3.3 mEq/L — ABNORMAL LOW (ref 3.5–5.1)
Sodium: 136 mEq/L (ref 135–145)
Total Bilirubin: 0.3 mg/dL (ref 0.3–1.2)

## 2011-11-13 LAB — CBC
MCHC: 32.6 g/dL (ref 30.0–36.0)
Platelets: 341 10*3/uL (ref 150–400)
RDW: 14.5 % (ref 11.5–15.5)

## 2011-11-13 LAB — URINALYSIS, ROUTINE W REFLEX MICROSCOPIC
Specific Gravity, Urine: 1.02 (ref 1.005–1.030)
Urobilinogen, UA: 0.2 mg/dL (ref 0.0–1.0)

## 2011-11-13 LAB — URINE MICROSCOPIC-ADD ON

## 2011-11-13 MED ORDER — CIPROFLOXACIN IN D5W 400 MG/200ML IV SOLN
400.0000 mg | Freq: Once | INTRAVENOUS | Status: AC
  Administered 2011-11-13: 400 mg via INTRAVENOUS
  Filled 2011-11-13: qty 200

## 2011-11-13 MED ORDER — POTASSIUM CHLORIDE 20 MEQ PO PACK
20.0000 meq | PACK | Freq: Once | ORAL | Status: AC
  Administered 2011-11-13: 20 meq via ORAL
  Filled 2011-11-13: qty 1

## 2011-11-13 MED ORDER — CIPROFLOXACIN HCL 500 MG PO TABS: 500.0000 mg | ORAL_TABLET | Freq: Two times a day (BID) | ORAL | Status: AC

## 2011-11-13 MED ORDER — IOHEXOL 300 MG/ML  SOLN
100.0000 mL | Freq: Once | INTRAMUSCULAR | Status: AC | PRN
  Administered 2011-11-13: 100 mL via INTRAVENOUS

## 2011-11-13 MED ORDER — SODIUM CHLORIDE 0.9 % IV SOLN
Freq: Once | INTRAVENOUS | Status: AC
  Administered 2011-11-13: 22:00:00 via INTRAVENOUS

## 2011-11-13 MED ORDER — HYDROCODONE-ACETAMINOPHEN 5-325 MG PO TABS: 1.0000 | ORAL_TABLET | Freq: Four times a day (QID) | ORAL | Status: AC | PRN

## 2011-11-13 NOTE — ED Notes (Signed)
Pt reports left sided abd pain starting yesterday and today pain has moved to rlq

## 2011-11-13 NOTE — ED Provider Notes (Signed)
History  Scribed for Benny Lennert, MD, the patient was seen in room APA07/APA07. This chart was scribed by Candelaria Stagers. The patient's care started at 9:30 PM    CSN: [id number] Arrival date & time: 11/13/2011  9:09 PM   First MD Initiated Contact with Patient 11/13/11 2127      Chief Complaint  Patient presents with  . Abdominal Pain     The history is provided by the patient.   [name] is a 45 y.o. female who presents to the Emergency Department complaining of constant abdominal pain of the right lower quadrant that started about 12 hours ago.  Pt states that yesterday she felt pain in the left side which subsided after a few hrs and started feeling pain in the right side this morning which has worsened.  She denies cough, SOB, dysuria.  She is experiencing mild nausea.  She has taken ibuprofen and tylenol with no relief and states that it feel worse when sitting up.  Pt has h/o c-section, and gallbladder removal.      Past Medical History  Diagnosis Date  . Diabetes mellitus     Past Surgical History  Procedure Date  . Cesarean section     History reviewed. No pertinent family history.  History  Substance Use Topics  . Smoking status: Never Smoker   . Smokeless tobacco: Not on file  . Alcohol Use: No    OB History    Grav Para Term Preterm Abortions TAB SAB Ect Mult Living                  Review of Systems  Constitutional: Negative for fever.  HENT: Negative for rhinorrhea.   Eyes: Negative for pain.  Respiratory: Negative for cough and shortness of breath.   Cardiovascular: Negative for chest pain.  Gastrointestinal: Positive for nausea and abdominal pain. Negative for vomiting and diarrhea.  Genitourinary: Negative for dysuria.  Skin: Negative for rash.  Neurological: Negative for headaches.  Psychiatric/Behavioral: Negative for agitation.    Allergies  Codeine; Morphine; Penicillins; and Sulfonamide derivatives  Home Medications    Current Outpatient Rx  Name Route Sig Dispense Refill  . ACETAMINOPHEN 500 MG PO TABS Oral Take 500-1,000 mg by mouth as needed. For pain     . IBUPROFEN 200 MG PO TABS Oral Take 200 mg by mouth as needed. For pain     . METFORMIN HCL 500 MG PO TABS Oral Take 500 mg by mouth 2 (two) times daily with a meal.      . CIPROFLOXACIN HCL 500 MG PO TABS Oral Take 1 tablet (500 mg total) by mouth every 12 (twelve) hours. 14 tablet 0  . HYDROCODONE-ACETAMINOPHEN 5-325 MG PO TABS Oral Take 1 tablet by mouth every 6 (six) hours as needed for pain. 15 tablet 0    BP 145/80  Pulse 84  Temp(Src) 98.6 F (37 C) (Oral)  Resp 20  Ht 4\' 8"  (1.422 m)  Wt 150 lb (68.04 kg)  BMI 33.63 kg/m2  SpO2 100%  LMP 11/13/2011  Physical Exam  Nursing note and vitals reviewed. Constitutional: She is oriented to person, place, and time. She appears well-developed and well-nourished. No distress.  HENT:  Head: Normocephalic and atraumatic.  Eyes: EOM are normal. Pupils are equal, round, and reactive to light.  Neck: Neck supple. No tracheal deviation present.  Cardiovascular: Normal rate.   Pulmonary/Chest: Effort normal. No respiratory distress.  Abdominal: She exhibits no distension.  Mild tenderness in right lower quadrant.   Musculoskeletal: Normal range of motion. She exhibits no edema.  Neurological: She is alert and oriented to person, place, and time. No sensory deficit.  Skin: Skin is warm and dry.  Psychiatric: She has a normal mood and affect. Her behavior is normal.    ED Course  Procedures   DIAGNOSTIC STUDIES: Oxygen Saturation is 100% on room air, normal by my interpretation.    COORDINATION OF CARE:  9:35PM Ordered: Pregnancy, urine ; CBC ; Differential ; Comprehensive metabolic panel ; Lipase, blood ; 0.9 % sodium chloride infusion ; CT Abdomen Pelvis W Contrast  10:46PM Orderd: Urine culture ; ciprofloxacin (CIPRO) IVPB 400 mg   Labs Reviewed  URINALYSIS, ROUTINE W REFLEX  MICROSCOPIC - Abnormal; Notable for the following:    APPearance HAZY (*)    Hgb urine dipstick LARGE (*)    Protein, ur TRACE (*)    Nitrite POSITIVE (*)    Leukocytes, UA MODERATE (*)    All other components within normal limits  CBC - Abnormal; Notable for the following:    Hemoglobin 10.9 (*)    HCT 33.4 (*)    All other components within normal limits  COMPREHENSIVE METABOLIC PANEL - Abnormal; Notable for the following:    Potassium 3.3 (*)    Glucose, Bld 116 (*)    Albumin 3.1 (*)    All other components within normal limits  URINE MICROSCOPIC-ADD ON - Abnormal; Notable for the following:    Squamous Epithelial / LPF FEW (*)    Bacteria, UA MANY (*)    All other components within normal limits  PREGNANCY, URINE  DIFFERENTIAL  LIPASE, BLOOD  URINE CULTURE   No results found.   1. UTI (lower urinary tract infection)       MDM     The chart was scribed for me under my direct supervision.  I personally performed the history, physical, and medical decision making and all procedures in the evaluation of this patient.Benny Lennert, MD 11/13/11 [phone]

## 2011-11-13 NOTE — ED Notes (Signed)
Pt on menstrual cycle

## 2011-11-14 NOTE — ED Provider Notes (Signed)
History     CSN: [id number] Arrival date & time: 11/13/2011  9:09 PM   First MD Initiated Contact with Patient 11/13/11 2127      Chief Complaint  Patient presents with  . Abdominal Pain    (Consider location/radiation/quality/duration/timing/severity/associated sxs/prior treatment) HPI  Past Medical History  Diagnosis Date  . Diabetes mellitus     Past Surgical History  Procedure Date  . Cesarean section     History reviewed. No pertinent family history.  History  Substance Use Topics  . Smoking status: Never Smoker   . Smokeless tobacco: Not on file  . Alcohol Use: No    OB History    Grav Para Term Preterm Abortions TAB SAB Ect Mult Living                  Review of Systems  Allergies  Codeine; Morphine; Penicillins; and Sulfonamide derivatives  Home Medications   Current Outpatient Rx  Name Route Sig Dispense Refill  . ACETAMINOPHEN 500 MG PO TABS Oral Take 500-1,000 mg by mouth as needed. For pain     . IBUPROFEN 200 MG PO TABS Oral Take 200 mg by mouth as needed. For pain     . METFORMIN HCL 500 MG PO TABS Oral Take 500 mg by mouth 2 (two) times daily with a meal.      . CIPROFLOXACIN HCL 500 MG PO TABS Oral Take 1 tablet (500 mg total) by mouth every 12 (twelve) hours. 14 tablet 0  . HYDROCODONE-ACETAMINOPHEN 5-325 MG PO TABS Oral Take 1 tablet by mouth every 6 (six) hours as needed for pain. 15 tablet 0    BP 109/86  Pulse 73  Temp(Src) 98.6 F (37 C) (Oral)  Resp 17  Ht 4\' 8"  (1.422 m)  Wt 150 lb (68.04 kg)  BMI 33.63 kg/m2  SpO2 99%  LMP 11/13/2011  Physical Exam  ED Course  Procedures (including critical care time)  Labs Reviewed  URINALYSIS, ROUTINE W REFLEX MICROSCOPIC - Abnormal; Notable for the following:    APPearance HAZY (*)    Hgb urine dipstick LARGE (*)    Protein, ur TRACE (*)    Nitrite POSITIVE (*)    Leukocytes, UA MODERATE (*)    All other components within normal limits  CBC - Abnormal; Notable for the  following:    Hemoglobin 10.9 (*)    HCT 33.4 (*)    All other components within normal limits  COMPREHENSIVE METABOLIC PANEL - Abnormal; Notable for the following:    Potassium 3.3 (*)    Glucose, Bld 116 (*)    Albumin 3.1 (*)    All other components within normal limits  URINE MICROSCOPIC-ADD ON - Abnormal; Notable for the following:    Squamous Epithelial / LPF FEW (*)    Bacteria, UA MANY (*)    All other components within normal limits  PREGNANCY, URINE  DIFFERENTIAL  LIPASE, BLOOD  URINE CULTURE   Ct Abdomen Pelvis W Contrast  11/13/2011  *RADIOLOGY REPORT*  Clinical Data: Right lower quadrant abdominal pain for 12 hours.  CT ABDOMEN AND PELVIS WITH CONTRAST  Technique:  Multidetector CT imaging of the abdomen and pelvis was performed following the standard protocol during bolus administration of intravenous contrast.  Contrast: OMNIPAQUE IOHEXOL 300 MG/ML IV SOLN  Comparison: CT of the abdomen and pelvis performed 03/24/2008  Findings: The visualized lung bases are clear.  The liver and spleen are unremarkable in appearance.  The patient is status  post cholecystectomy, with clips noted at the gallbladder fossa.  A 1.2 cm nodule at the left adrenal gland appears stable and likely reflects a small adrenal adenoma.  The right adrenal gland is unremarkable in appearance.  The pancreas is normal in appearance.  Since the prior study in 2009, the stone at the right ureteropelvic junction has increased significantly in size, now measuring approximately 2.1 x 0.9 cm.  In addition, delayed imaging demonstrates suspicion for an adjacent irregular lobulated mass at the renal hilum, measuring approximately 2.0 x 2.0 cm.  Though this could reflect debris, malignancy cannot be excluded.  There is resultant relatively severe right-sided chronic hydronephrosis.  Note is also made of several nonobstructing stones at the lower pole of the right kidney, measuring up to 1.0 cm in size.  There is diffuse  mild soft tissue stranding and wall thickening along the proximal right ureter, despite the lack of a distal obstructing stone.  This appears grossly stable from 2009, and may reflect chronic inflammation.  Left-sided pelvicaliectasis is noted, without significant hydronephrosis.  No significant perinephric stranding is seen on either side.  The left kidney is otherwise unremarkable in appearance.  No free fluid is identified.  The small bowel is unremarkable in appearance.  The stomach is within normal limits.  No acute vascular abnormalities are seen.  A small umbilical hernia is noted, containing only fat.  The appendix is normal in caliber and contains contrast, without evidence for appendicitis.  Contrast progresses to the level of the mid transverse colon.  Mild scattered diverticulosis is noted along the splenic flexure of the colon.  The colon is otherwise unremarkable in appearance.  The bladder is mildly distended; there is mild soft tissue inflammation about the bladder, possibly reflecting mild cystitis. This is slightly more prominent than on the prior study.  Mild nodularity to the contour of the uterus may reflect small fibroids, stable from the prior study.  A 2.5 cm left adnexal cystic lesion is likely physiologic; no suspicious adnexal masses are seen.  The ovaries are otherwise symmetric.  No inguinal lymphadenopathy is seen.  No acute osseous abnormalities are identified.  Vacuum phenomenon and disc space narrowing are noted at L4-L5.  Chronic bilateral pars defects are noted at L5, without evidence of anterolisthesis.  IMPRESSION:  1.  Suspect irregular lobulated 2.0 cm mass at the right renal hilum on delayed imaging, with interval increase in the size of the large adjacent 2.1 x 0.9 cm stone at the right ureteropelvic junction.  This could reflect debris, but malignancy cannot be excluded. 2.  Resultant relatively severe right-sided chronic hydronephrosis, significantly worsened from 2009,  reflecting obstruction by the large stone. 3.  Nonobstructing stones noted at the lower pole of the right kidney, measuring up to 1.0 cm in size. 4.  Diffuse mild soft tissue inflammation and wall thickening along the proximal right ureter may reflect chronic inflammation, relatively stable from 2009; mild soft tissue inflammation about the bladder is slightly more prominent, and may reflect mild cystitis. 5.  Small umbilical hernia, containing only fat. 6.  Mild scattered diverticulosis along the splenic flexure of the colon, without evidence of diverticulitis. 7.  Stable small left adrenal adenoma. 8.  Likely small uterine fibroids are stable from 2009. 9.  Chronic bilateral pars defects at L5, without evidence of anterolisthesis or corresponding degenerative change.  Findings were discussed with Dr. Judd Lien at 11:54 p.m. on 11/13/2011.  Original Report Authenticated By: Tonia Ghent, M.D.     1.  UTI (lower urinary tract infection)       MDM  Care assumed from Dr. Estell Harpin.  I agree with his assessment and plan as above.  The CT shows no appendicitis, but does show a kidney stone and area in the kidney that may be debris, but other pathology cannot be ruled out.  I have advised her that she needs follow up with her pcp in the next 1-2 weeks to discuss this.  She feels better and will be discharged with cipro and norco.        Geoffery Lyons, MD 11/14/11 [phone]

## 2011-11-16 LAB — URINE CULTURE: Culture  Setup Time: 201212052318

## 2011-11-17 NOTE — ED Notes (Signed)
+   urine culture. Chart sent to EDP office for review 

## 2011-11-20 ENCOUNTER — Telehealth (HOSPITAL_COMMUNITY): Payer: Self-pay | Admitting: Emergency Medicine

## 2011-11-20 NOTE — ED Notes (Signed)
Patient returned call and informed of positive results. Rx called to [facility] by Emiliano Dyer, PFM.

## 2011-11-20 NOTE — ED Notes (Signed)
Macrobid 100 mg BID x 7 days disp #14 written by Pascal Lux Wingen need to be called to pharmacy.

## 2011-12-10 ENCOUNTER — Ambulatory Visit (HOSPITAL_COMMUNITY): Payer: Self-pay

## 2012-01-07 ENCOUNTER — Ambulatory Visit (HOSPITAL_COMMUNITY)
Admission: RE | Admit: 2012-01-07 | Discharge: 2012-01-07 | Disposition: A | Discharge status: Home / Self Care | Payer: No Typology Code available for payment source | Source: Ambulatory Visit | Attending: "Endocrinology | Admitting: "Endocrinology

## 2012-01-07 DIAGNOSIS — Z139 Encounter for screening, unspecified: Secondary | ICD-10-CM

## 2012-01-07 DIAGNOSIS — Z1231 Encounter for screening mammogram for malignant neoplasm of breast: Secondary | ICD-10-CM | POA: Insufficient documentation

## 2012-01-27 ENCOUNTER — Encounter (HOSPITAL_COMMUNITY): Payer: Self-pay

## 2012-01-27 ENCOUNTER — Other Ambulatory Visit: Payer: Self-pay

## 2012-01-27 ENCOUNTER — Emergency Department (HOSPITAL_COMMUNITY): Payer: No Typology Code available for payment source

## 2012-01-27 ENCOUNTER — Emergency Department (HOSPITAL_COMMUNITY)
Admission: EM | Admit: 2012-01-27 | Discharge: 2012-01-27 | Disposition: A | Discharge status: Home / Self Care | Payer: No Typology Code available for payment source | Attending: Emergency Medicine | Admitting: Emergency Medicine

## 2012-01-27 DIAGNOSIS — R0602 Shortness of breath: Secondary | ICD-10-CM | POA: Insufficient documentation

## 2012-01-27 DIAGNOSIS — R079 Chest pain, unspecified: Secondary | ICD-10-CM | POA: Insufficient documentation

## 2012-01-27 DIAGNOSIS — E119 Type 2 diabetes mellitus without complications: Secondary | ICD-10-CM | POA: Insufficient documentation

## 2012-01-27 DIAGNOSIS — R0789 Other chest pain: Secondary | ICD-10-CM

## 2012-01-27 DIAGNOSIS — IMO0001 Reserved for inherently not codable concepts without codable children: Secondary | ICD-10-CM | POA: Insufficient documentation

## 2012-01-27 LAB — POCT I-STAT TROPONIN I: Troponin i, poc: 0 ng/mL (ref 0.00–0.08)

## 2012-01-27 LAB — BASIC METABOLIC PANEL
BUN: 9 mg/dL (ref 6–23)
Calcium: 8.9 mg/dL (ref 8.4–10.5)
GFR calc Af Amer: >90 mL/min (ref >90)
GFR calc non Af Amer: >90 mL/min (ref >90)
Potassium: 4.2 mEq/L (ref 3.5–5.1)

## 2012-01-27 LAB — CBC
HCT: 36.8 % (ref 36.0–46.0)
MCHC: 32.6 g/dL (ref 30.0–36.0)
RDW: 13.8 % (ref 11.5–15.5)

## 2012-01-27 MED ORDER — CYCLOBENZAPRINE HCL 10 MG PO TABS
10.0000 mg | ORAL_TABLET | Freq: Once | ORAL | Status: AC
  Administered 2012-01-27: 10 mg via ORAL
  Filled 2012-01-27: qty 1

## 2012-01-27 MED ORDER — TRAMADOL-ACETAMINOPHEN 37.5-325 MG PO TABS: ORAL_TABLET | ORAL | Status: AC

## 2012-01-27 MED ORDER — KETOROLAC TROMETHAMINE 60 MG/2ML IM SOLN
60.0000 mg | Freq: Once | INTRAMUSCULAR | Status: AC
  Administered 2012-01-27: 60 mg via INTRAMUSCULAR
  Filled 2012-01-27: qty 2

## 2012-01-27 MED ORDER — DIPHENHYDRAMINE HCL 50 MG/ML IJ SOLN
50.0000 mg | Freq: Once | INTRAMUSCULAR | Status: AC
  Administered 2012-01-27: 50 mg via INTRAMUSCULAR

## 2012-01-27 MED ORDER — CYCLOBENZAPRINE HCL 10 MG PO TABS: 10.0000 mg | ORAL_TABLET | Freq: Three times a day (TID) | ORAL | Status: AC | PRN

## 2012-01-27 MED ORDER — DIPHENHYDRAMINE HCL 50 MG/ML IJ SOLN
INTRAMUSCULAR | Status: AC
  Filled 2012-01-27: qty 1

## 2012-01-27 NOTE — ED Notes (Signed)
Pt c/o itching all over body and has red rash on her neck. Pt scratching continuously and has scratches noted on neck and face.

## 2012-01-27 NOTE — ED Notes (Signed)
Pt return from xray.

## 2012-01-27 NOTE — ED Notes (Signed)
Cardiac monitor showing NSR 

## 2012-01-27 NOTE — ED Notes (Signed)
Pt presents with non radiating left sided chest pain and SOB x 2 weeks. Pt states she feels like its stabbing. Pt also c/o headache.

## 2012-01-27 NOTE — ED Notes (Signed)
Pt still c/o itching. Small amount redness noted on neck. Pt alert and oriented x 3. Skin warm and dry. Color pink. No difficulty breathing or swelling. Cardiac monitor showing NSR.

## 2012-01-27 NOTE — ED Notes (Signed)
Pt states that the itching is still bad. Family holding her hands so she can't scratch herself. Pt does not have difficulty breathing or swelling at this time. Hives noted on neck.

## 2012-01-27 NOTE — ED Notes (Signed)
Pt connected to cardiac monitor showing NSR. Pt has reproducible chest pain over left upper chest with palpation. Alert and oriented x 3. Skin warm and dry. Color pink. Breath sounds clear and equal bilaterally.

## 2012-01-27 NOTE — ED Notes (Signed)
Pt has small amount of redness around upper chest but none noted on her neck or face. Pt has several scratches to her neck , ears and chest.

## 2012-01-27 NOTE — ED Provider Notes (Signed)
History     CSN: [id number]  Arrival date & time 01/27/12  1128   First MD Initiated Contact with Patient 01/27/12 1304      Chief Complaint  Patient presents with  . Chest Pain  . Shortness of Breath    (Consider location/radiation/quality/duration/timing/severity/associated sxs/prior treatment) HPI  Patient relates she has been having chest pain for the past few weeks. She states last night got worse and then tonight while she was in church got worse. She indicates the pain is in her left upper chest and it feels like someone sitting on it. She states it lasted 10-15 minutes. She states the pain however has been constant since last night. She states today also her left hand felt numb and she felt short of breath. She denies cough or diaphoresis or nausea or vomiting. She states nothing makes it feel better and nothing makes it feel worse however she's not tried anything for the pain. Other states patient has been under a lot of stress relating to a 46 year old teenager that she has been having arguments with patient also getting married in a few weeks.  PCP Dr. Fransico Him  Past Medical History  Diagnosis Date  . Diabetes mellitus    1 year  Past Surgical History  Procedure Date  . Cesarean section      No family history on file.  No family history of coronary artery disease   History  Substance Use Topics  . Smoking status: Never Smoker   . Smokeless tobacco: Not on file  . Alcohol Use: No   employed  OB History    Grav Para Term Preterm Abortions TAB SAB Ect Mult Living                  Review of Systems  All other systems reviewed and are negative.    Allergies  Codeine; Morphine; Penicillins; and Sulfonamide derivatives  Home Medications   Current Outpatient Rx  Name Route Sig Dispense Refill  . METFORMIN HCL 500 MG PO TABS Oral Take 500 mg by mouth 2 (two) times daily with a meal.        BP 137/83  Pulse 74  Temp(Src) 98.2 F (36.8 C) (Oral)  Resp  20  Ht 4\' 8"  (1.422 m)  Wt 175 lb (79.379 kg)  BMI 39.23 kg/m2  SpO2 97%  LMP 01/11/2012  Vital signs normal    Physical Exam  Nursing note and vitals reviewed. Constitutional: She is oriented to person, place, and time. She appears well-developed and well-nourished. She is cooperative.  HENT:  Head: Normocephalic and atraumatic.  Right Ear: External ear normal.  Left Ear: External ear normal.  Nose: Nose normal.  Mouth/Throat: Oropharynx is clear and moist.  Eyes: Conjunctivae and EOM are normal. Pupils are equal, round, and reactive to light.  Neck: Normal range of motion and full passive range of motion without pain. Neck supple.  Cardiovascular: Normal rate, regular rhythm and normal heart sounds.   Pulmonary/Chest: Effort normal and breath sounds normal. Not tachypneic. No respiratory distress. She exhibits tenderness.         Patient's very tender to her left upper chest wall and winces when it's lightly touched.  Abdominal: Soft. Bowel sounds are normal. She exhibits no distension. There is no tenderness. There is no rebound and no guarding.  Musculoskeletal: Normal range of motion. She exhibits no edema and no tenderness.       Moves all extremities well  Neurological: She is alert and  oriented to person, place, and time. She has normal strength. No cranial nerve deficit.  Skin: Skin is warm, dry and intact.  Psychiatric: Her speech is normal and behavior is normal. Thought content normal.       Affect is flat    ED Course  Procedures (including critical care time)    Medications  ketorolac (TORADOL) injection 60 mg (60 mg Intramuscular Given 01/27/12 1354)  cyclobenzaprine (FLEXERIL) tablet 10 mg (10 mg Oral Given 01/27/12 1354)  diphenhydrAMINE (BENADRYL) injection 50 mg (50 mg Intramuscular Given 01/27/12 1416)   Patient given Toradol 60 mg IM and Flexeril orally. Afterward she started scratching her neck and she was given Benadryl 50 mg IM. Nurse reports there is  no rash there is just redness where the patient has scratched. She did not have trouble breathing or swallowing. Patient relates her chest wall is still tender however it was felt she was medically stable to go home.  Results for orders placed during the hospital encounter of 01/27/12  CBC      Component Value Range   WBC 7.3  4.0 - 10.5 (K/uL)   RBC 4.58  3.87 - 5.11 (MIL/uL)   Hemoglobin 12.0  12.0 - 15.0 (g/dL)   HCT 16.1  09.6 - 04.5 (%)   MCV 80.3  78.0 - 100.0 (fL)   MCH 26.2  26.0 - 34.0 (pg)   MCHC 32.6  30.0 - 36.0 (g/dL)   RDW 40.9  81.1 - 91.4 (%)   Platelets 335  150 - 400 (K/uL)  BASIC METABOLIC PANEL      Component Value Range   Sodium 137  135 - 145 (mEq/L)   Potassium 4.2  3.5 - 5.1 (mEq/L)   Chloride 103  96 - 112 (mEq/L)   CO2 26  19 - 32 (mEq/L)   Glucose, Bld 166 (*) 70 - 99 (mg/dL)   BUN 9  6 - 23 (mg/dL)   Creatinine, Ser 7.82  0.50 - 1.10 (mg/dL)   Calcium 8.9  8.4 - 95.6 (mg/dL)   GFR calc non Af Amer >90  >90 (mL/min)   GFR calc Af Amer >90  >90 (mL/min)  POCT I-STAT TROPONIN I      Component Value Range   Troponin i, poc 0.00  0.00 - 0.08 (ng/mL)   Comment 3           POCT I-STAT TROPONIN I      Component Value Range   Troponin i, poc 0.00  0.00 - 0.08 (ng/mL)   Comment 3            Laboratory interpretation all normal except mild hyperglycemia  Dg Chest 2 View  01/27/2012  *RADIOLOGY REPORT*  Clinical Data: Chest pain.  Short of breath.  CHEST - 2 VIEW  Comparison: Chest CT 11/05/2011.  Findings: Low volume chest with basilar atelectasis.  No airspace disease or effusion identified. Cholecystectomy clips are present in the right upper quadrant. Monitoring leads are projected over the chest.  Cardiopericardial silhouette and mediastinal contours appear within normal limits.  IMPRESSION: Low volume chest with basilar atelectasis.  Original Report Authenticated By: Andreas Newport, M.D.    Date: 01/27/2012  Rate: 78  Rhythm: normal sinus rhythm  QRS  Axis: normal  Intervals: normal  ST/T Wave abnormalities: normal  Conduction Disutrbances:none  Narrative Interpretation:   Old EKG Reviewed: unchanged from 12/18/2010       1. Chest pain   2. Musculoskeletal chest pain  New Prescriptions   CYCLOBENZAPRINE (FLEXERIL) 10 MG TABLET    Take 1 tablet (10 mg total) by mouth 3 (three) times daily as needed for muscle spasms.   TRAMADOL-ACETAMINOPHEN (ULTRACET) 37.5-325 MG PER TABLET    2 tabs po QID prn pain   Plan discharge Devoria Albe, MD, Armando Gang   MDM          Ward Givens, MD 01/27/12 [phone]

## 2012-01-29 ENCOUNTER — Other Ambulatory Visit (HOSPITAL_COMMUNITY)
Admission: RE | Admit: 2012-01-29 | Discharge: 2012-01-29 | Disposition: A | Discharge status: Home / Self Care | Payer: No Typology Code available for payment source | Source: Ambulatory Visit | Attending: Obstetrics & Gynecology | Admitting: Obstetrics & Gynecology

## 2012-01-29 ENCOUNTER — Other Ambulatory Visit: Payer: Self-pay | Admitting: Obstetrics & Gynecology

## 2012-01-29 DIAGNOSIS — Z01419 Encounter for gynecological examination (general) (routine) without abnormal findings: Secondary | ICD-10-CM | POA: Insufficient documentation

## 2012-02-08 HISTORY — PX: FOOT SURGERY: SHX648

## 2012-04-15 ENCOUNTER — Encounter (HOSPITAL_COMMUNITY): Payer: Self-pay | Admitting: Pharmacy Technician

## 2012-04-15 ENCOUNTER — Encounter (HOSPITAL_COMMUNITY): Payer: No Typology Code available for payment source

## 2012-04-16 ENCOUNTER — Encounter: Payer: Self-pay | Admitting: Orthopedic Surgery

## 2012-04-16 ENCOUNTER — Encounter (HOSPITAL_COMMUNITY)
Admission: RE | Admit: 2012-04-16 | Discharge: 2012-04-16 | Disposition: A | Discharge status: Home / Self Care | Payer: No Typology Code available for payment source | Source: Ambulatory Visit | Attending: Obstetrics & Gynecology | Admitting: Obstetrics & Gynecology

## 2012-04-16 ENCOUNTER — Other Ambulatory Visit: Payer: Self-pay | Admitting: Orthopedic Surgery

## 2012-04-16 ENCOUNTER — Encounter (HOSPITAL_COMMUNITY): Payer: Self-pay

## 2012-04-16 ENCOUNTER — Ambulatory Visit: Payer: PRIVATE HEALTH INSURANCE | Admitting: Orthopedic Surgery

## 2012-04-16 ENCOUNTER — Ambulatory Visit (HOSPITAL_COMMUNITY)
Admission: RE | Admit: 2012-04-16 | Discharge: 2012-04-16 | Disposition: A | Discharge status: Home / Self Care | Payer: No Typology Code available for payment source | Source: Ambulatory Visit | Attending: Orthopedic Surgery | Admitting: Orthopedic Surgery

## 2012-04-16 ENCOUNTER — Other Ambulatory Visit: Payer: Self-pay | Admitting: Obstetrics & Gynecology

## 2012-04-16 ENCOUNTER — Ambulatory Visit (INDEPENDENT_AMBULATORY_CARE_PROVIDER_SITE_OTHER): Payer: No Typology Code available for payment source | Admitting: Orthopedic Surgery

## 2012-04-16 VITALS — BP 108/60 | Ht <= 58 in | Wt 175.0 lb

## 2012-04-16 DIAGNOSIS — M25519 Pain in unspecified shoulder: Secondary | ICD-10-CM | POA: Insufficient documentation

## 2012-04-16 DIAGNOSIS — M25511 Pain in right shoulder: Secondary | ICD-10-CM

## 2012-04-16 DIAGNOSIS — M75101 Unspecified rotator cuff tear or rupture of right shoulder, not specified as traumatic: Secondary | ICD-10-CM | POA: Insufficient documentation

## 2012-04-16 DIAGNOSIS — M67919 Unspecified disorder of synovium and tendon, unspecified shoulder: Secondary | ICD-10-CM

## 2012-04-16 LAB — CBC
Hemoglobin: 12.3 g/dL (ref 12.0–15.0)
MCH: 25.9 pg — ABNORMAL LOW (ref 26.0–34.0)
MCHC: 32.6 g/dL (ref 30.0–36.0)
RDW: 14.7 % (ref 11.5–15.5)

## 2012-04-16 LAB — URINALYSIS, ROUTINE W REFLEX MICROSCOPIC
Nitrite: POSITIVE — AB
Specific Gravity, Urine: 1.015 (ref 1.005–1.030)
Urobilinogen, UA: 0.2 mg/dL (ref 0.0–1.0)
pH: 6 (ref 5.0–8.0)

## 2012-04-16 LAB — COMPREHENSIVE METABOLIC PANEL
ALT: 18 U/L (ref 0–35)
Albumin: 3.4 g/dL — ABNORMAL LOW (ref 3.5–5.2)
Alkaline Phosphatase: 117 U/L (ref 39–117)
Calcium: 9.2 mg/dL (ref 8.4–10.5)
GFR calc Af Amer: >90 mL/min (ref >90)
Glucose, Bld: 264 mg/dL — ABNORMAL HIGH (ref 70–99)
Potassium: 4.4 mEq/L (ref 3.5–5.1)
Sodium: 135 mEq/L (ref 135–145)
Total Protein: 7 g/dL (ref 6.0–8.3)

## 2012-04-16 LAB — SURGICAL PCR SCREEN
MRSA, PCR: NEGATIVE
Staphylococcus aureus: POSITIVE — AB

## 2012-04-16 LAB — URINE MICROSCOPIC-ADD ON

## 2012-04-16 LAB — HCG, QUANTITATIVE, PREGNANCY: hCG, Beta Chain, Quant, S: <1 m[IU]/mL (ref <5)

## 2012-04-16 MED ORDER — HYDROCODONE-ACETAMINOPHEN 5-325 MG PO TABS: 1.0000 | ORAL_TABLET | Freq: Four times a day (QID) | ORAL | Status: AC | PRN

## 2012-04-16 NOTE — Progress Notes (Signed)
Called office to report UA. Asked Dr. Despina Hidden to look at results. Spoke to [place] at office.

## 2012-04-16 NOTE — Patient Instructions (Addendum)
You have received a steroid shot. 15% of patients experience increased pain at the injection site with in the next 24 hours. This is best treated with ice and tylenol extra strength 2 tabs every 8 hours. If you are still having pain please call the office.    Bursitis Bursitis is when the fluid-filled sac (bursa) that covers and protects a joint gets puffy and irritated. The elbow, shoulder, hip, and knee joints are most often affected. HOME CARE  Put ice on the area.   Put ice in a plastic bag.   Place a towel between your skin and the bag.   Leave the ice on for 15 to 20 minutes, 3 to 4 times a day.   Put the joint through a full range of motion 4 times a day. Rest the injured joint at other times. When you have less pain, begin slow movements and usual activities.   Only take medicine as told by your doctor.   Follow up with your doctor. Any delay in care could stop the bursitis from healing. This could cause long-term pain.  GET HELP RIGHT AWAY IF:    You have more pain with treatment.   You have a temperature by mouth above 102 F (38.9 C), not controlled by medicine.   You have heat and irritation over the fluid-filled sac.  MAKE SURE YOU:    Understand these instructions.   Will watch your condition.   Will get help right away if you are not doing well or get worse.  Document Released: 05/16/2010 Document Revised: 11/15/2011 Document Reviewed: 05/16/2010 [facility] Patient Information 2012 [place], [place].

## 2012-04-16 NOTE — Patient Instructions (Addendum)
20 [name]  04/16/2012   Your procedure is scheduled on:  04/25/2012  Report to [facility] at [place] [facility].  Call this number if you have problems the morning of surgery: [phone]   Remember:   Do not eat food:After Midnight.  May have clear liquids:until Midnight .  Clear liquids include soda, tea, black coffee, apple or grape juice, broth.  Take these medicines the morning of surgery with A SIP OF WATER:    Dilation and Curettage or Vacuum Curettage Dilation and curettage (D&C) and vacuum curettage are minor procedures. A D&C involves stretching (dilation) the cervix and scraping (curettage) the inside lining of the womb (uterus). During a D&C, tissue is gently scraped from the inside lining of the uterus. During a vacuum curettage, the lining and tissue in the uterus are removed with the use of gentle suction. Curettage may be performed for diagnostic or therapeutic purposes. As a diagnostic procedure, curettage is performed for the purpose of examining tissues from the uterus. Tissue examination may help determine causes or treatment options for symptoms. A diagnostic curettage may be performed for the following symptoms:  Irregular bleeding in the uterus.   Bleeding with the development of clots.   Spotting between menstrual periods.   Prolonged menstrual periods.   Bleeding after menopause.   No menstrual period (amenorrhea).   A change in size and shape of the uterus.  A therapeutic curettage is performed to remove tissue, blood, or a contraceptive device. Therapeutic curettage may be performed for the following conditions:   Removal of an IUD (intrauterine device).   Removal of retained placenta after giving birth. Retained placenta can cause bleeding severe enough to require transfusions or an infection.   Abortion.   Miscarriage.   Removal of polyps inside the uterus.   Removal of uncommon types of fibroids (noncancerous lumps).  LET YOUR CAREGIVER KNOW ABOUT:     Allergies to food or medicine.   Medicines taken, including vitamins, herbs, eyedrops, over-the-counter medicines, and creams.   Use of steroids (by mouth or creams).   Previous problems with anesthetics or numbing medicines.   History of bleeding problems or blood clots.   Previous surgery.   Other health problems, including diabetes and kidney problems.   Possibility of pregnancy, if this applies.  RISKS AND COMPLICATIONS   Excessive bleeding.   Infection of the uterus.   Damage to the cervix.   Development of scar tissue (adhesions) inside the uterus, later causing abnormal amounts of menstrual bleeding.   Complications from the general anesthetic, if a general anesthetic is used.   Putting a hole (perforation) in the uterus. This is rare.  BEFORE THE PROCEDURE   Eat and drink before the procedure only as directed by your caregiver.   Arrange for someone to take you home.  PROCEDURE   This procedure may be done in a hospital, outpatient clinic, or caregiver's office.   You may be given a general anesthetic or a local anesthetic in and around the cervix.   You will lie on your back with your legs in stirrups.   There are two ways in which your cervix can be softened and dilated. These include:   Taking a medicine.   Having thin rods (laminaria) inserted into your cervix.   A curved tool (curette) will scrape cells from the inside lining of the uterus and will then be removed.  This procedure usually takes about 15 to 30 minutes. AFTER THE PROCEDURE   You  will rest in the recovery area until you are stable and are ready to go home.   You will need to have someone take you home.   You may feel sick to your stomach (nauseous) or throw up (vomit) if you had general anesthesia.   You may have a sore throat if a tube was placed in your throat during general anesthesia.   You may have light cramping and bleeding for 2 days to 2 weeks after the procedure.    Your uterus needs to make a new lining after the procedure. This may make your next period late.  Document Released: 11/26/2005 Document Revised: 11/15/2011 Document Reviewed: 06/24/2009 [facility] Patient Information 2012 [place], [place].

## 2012-04-16 NOTE — Progress Notes (Signed)
  Subjective:    [name] is a 46 y.o. female who presents with tourniquet RIGHT shoulder pain radiating up into the neck. No trauma. The patient reports sharp throbbing, stabbing, 10 out of 10, constant intermittent pain, unrelieved by ibuprofen. Associated numbness and tingling, and painful for elevation.  Review of systems, fatigue and blurred vision, tightness in the chest, related to respiratory diseases. Numbness and tingling in the RIGHT upper extremity.  History of diabetes.  Past Medical History  Diagnosis Date  . Diabetes mellitus     Controlled by diet    Past Surgical History  Procedure Date  . Cesarean section 1994;2000    [place]  . Cholecystectomy 1992  . Sinus sergery 1988    [place]  . Foot surgery March, 2013    [facility]    Vital signs are stable as recorded  General appearance is normal  The patient is alert and oriented x3  The patient's mood and affect are normal  Gait assessment: normal  The cardiovascular exam reveals normal pulses and temperature without edema swelling.  The lymphatic system is negative for palpable lymph nodes  The sensory exam is normal.  There are no pathologic reflexes.  Balance is normal.   Exam of the Her RIGHT shoulder tenderness in the RIGHT cervical spine and RIGHT trapezius muscle. Tenderness in the peri-acromial area. Painful range of motion and 90 of flexion. No instability. Rotator cuff strength. Normal skin intact. Positive impingement sign.  LEFT shoulder with no contracture subluxation atrophy tremor or tenderness   Assessment:    Right rotator cuff tendinitis    Plan:    Natural history and expected course discussed. Questions answered. Agricultural engineer distributed. Reduction in offending activity. Shoulder injection. See procedure note. Follow up in 2 weeks.   Shoulder Injection Procedure Note   Pre-operative Diagnosis: right  RC Syndrome  Post-operative Diagnosis:  same  Indications: pain   Anesthesia: ethyl chloride   Procedure Details   Verbal consent was obtained for the procedure. The shoulder was prepped withalcohol and the skin was anesthetized. A 20 gauge needle was advanced into the subacromial space through posterior approach without difficulty  The space was then injected with 3 ml 1% lidocaine and 1 ml of depomedrol. The injection site was cleansed with isopropyl alcohol and a dressing was applied.  Complications:  None; patient tolerated the procedure well.

## 2012-04-22 MED ORDER — ONDANSETRON HCL 4 MG/2ML IJ SOLN
INTRAMUSCULAR | Status: AC
  Filled 2012-04-22: qty 2

## 2012-04-24 ENCOUNTER — Other Ambulatory Visit (HOSPITAL_COMMUNITY): Payer: Self-pay | Admitting: Internal Medicine

## 2012-04-24 DIAGNOSIS — M79604 Pain in right leg: Secondary | ICD-10-CM

## 2012-04-25 ENCOUNTER — Encounter (HOSPITAL_COMMUNITY): Payer: Self-pay | Admitting: Anesthesiology

## 2012-04-25 ENCOUNTER — Encounter (HOSPITAL_COMMUNITY): Payer: Self-pay | Admitting: *Deleted

## 2012-04-25 ENCOUNTER — Ambulatory Visit (HOSPITAL_COMMUNITY): Payer: No Typology Code available for payment source | Admitting: Anesthesiology

## 2012-04-25 ENCOUNTER — Ambulatory Visit (HOSPITAL_COMMUNITY)
Admission: RE | Admit: 2012-04-25 | Discharge: 2012-04-25 | Disposition: A | Discharge status: Home / Self Care | Payer: No Typology Code available for payment source | Source: Ambulatory Visit | Attending: Obstetrics & Gynecology | Admitting: Obstetrics & Gynecology

## 2012-04-25 ENCOUNTER — Encounter (HOSPITAL_COMMUNITY)
Admission: RE | Disposition: A | Discharge status: Home / Self Care | Payer: Self-pay | Source: Ambulatory Visit | Attending: Obstetrics & Gynecology

## 2012-04-25 DIAGNOSIS — E119 Type 2 diabetes mellitus without complications: Secondary | ICD-10-CM | POA: Insufficient documentation

## 2012-04-25 DIAGNOSIS — N920 Excessive and frequent menstruation with regular cycle: Secondary | ICD-10-CM | POA: Insufficient documentation

## 2012-04-25 DIAGNOSIS — N946 Dysmenorrhea, unspecified: Secondary | ICD-10-CM | POA: Insufficient documentation

## 2012-04-25 DIAGNOSIS — Z9889 Other specified postprocedural states: Secondary | ICD-10-CM

## 2012-04-25 DIAGNOSIS — Z01812 Encounter for preprocedural laboratory examination: Secondary | ICD-10-CM | POA: Insufficient documentation

## 2012-04-25 HISTORY — PX: HYSTEROSCOPY WITH THERMACHOICE: SHX5396

## 2012-04-25 SURGERY — HYSTEROSCOPY WITH THERMACHOICE
Anesthesia: General | Site: Uterus | Wound class: Clean Contaminated

## 2012-04-25 MED ORDER — KETOROLAC TROMETHAMINE 30 MG/ML IJ SOLN
INTRAMUSCULAR | Status: AC
  Filled 2012-04-25: qty 1

## 2012-04-25 MED ORDER — MIDAZOLAM HCL 5 MG/5ML IJ SOLN
INTRAMUSCULAR | Status: DC | PRN
  Administered 2012-04-25: 2 mg via INTRAVENOUS

## 2012-04-25 MED ORDER — GLYCOPYRROLATE 0.2 MG/ML IJ SOLN
0.2000 mg | Freq: Once | INTRAMUSCULAR | Status: AC
  Administered 2012-04-25: 0.2 mg via INTRAVENOUS

## 2012-04-25 MED ORDER — CIPROFLOXACIN IN D5W 400 MG/200ML IV SOLN
INTRAVENOUS | Status: AC
  Administered 2012-04-25: 400 mg via INTRAVENOUS
  Filled 2012-04-25: qty 200

## 2012-04-25 MED ORDER — KETOROLAC TROMETHAMINE 30 MG/ML IJ SOLN
30.0000 mg | Freq: Once | INTRAMUSCULAR | Status: AC
  Administered 2012-04-25: 30 mg via INTRAVENOUS

## 2012-04-25 MED ORDER — LACTATED RINGERS IV SOLN
INTRAVENOUS | Status: DC
Start: 2012-04-25 — End: 2012-04-25

## 2012-04-25 MED ORDER — FENTANYL CITRATE 0.05 MG/ML IJ SOLN
INTRAMUSCULAR | Status: AC
  Administered 2012-04-25: 50 ug via INTRAVENOUS
  Filled 2012-04-25: qty 2

## 2012-04-25 MED ORDER — DIPHENHYDRAMINE HCL 50 MG/ML IJ SOLN
INTRAMUSCULAR | Status: AC
  Administered 2012-04-25: 25 mg via INTRAVENOUS
  Filled 2012-04-25: qty 1

## 2012-04-25 MED ORDER — CLINDAMYCIN PHOSPHATE 900 MG/50ML IV SOLN
900.0000 mg | INTRAVENOUS | Status: AC
  Administered 2012-04-25: 900 mg via INTRAVENOUS

## 2012-04-25 MED ORDER — MIDAZOLAM HCL 2 MG/2ML IJ SOLN
INTRAMUSCULAR | Status: AC
  Administered 2012-04-25: 2 mg via INTRAVENOUS
  Filled 2012-04-25: qty 2

## 2012-04-25 MED ORDER — OXYCODONE-ACETAMINOPHEN 5-325 MG PO TABS
1.0000 | ORAL_TABLET | Freq: Once | ORAL | Status: AC
  Administered 2012-04-25: 1 via ORAL

## 2012-04-25 MED ORDER — ONDANSETRON HCL 4 MG/2ML IJ SOLN
INTRAMUSCULAR | Status: AC
  Administered 2012-04-25: 4 mg via INTRAVENOUS
  Filled 2012-04-25: qty 2

## 2012-04-25 MED ORDER — MIDAZOLAM HCL 2 MG/2ML IJ SOLN
INTRAMUSCULAR | Status: AC
  Filled 2012-04-25: qty 2

## 2012-04-25 MED ORDER — CLINDAMYCIN PHOSPHATE 900 MG/50ML IV SOLN
INTRAVENOUS | Status: AC
  Administered 2012-04-25: 900 mg via INTRAVENOUS
  Filled 2012-04-25: qty 50

## 2012-04-25 MED ORDER — FENTANYL CITRATE 0.05 MG/ML IJ SOLN
25.0000 ug | INTRAMUSCULAR | Status: DC | PRN
  Administered 2012-04-25 (×4): 50 ug via INTRAVENOUS

## 2012-04-25 MED ORDER — DEXTROSE 5 % IV SOLN
INTRAVENOUS | Status: DC | PRN
  Administered 2012-04-25: 500 mL via INTRAVENOUS

## 2012-04-25 MED ORDER — LIDOCAINE HCL (PF) 1 % IJ SOLN
INTRAMUSCULAR | Status: AC
  Filled 2012-04-25: qty 2

## 2012-04-25 MED ORDER — CIPROFLOXACIN IN D5W 400 MG/200ML IV SOLN
400.0000 mg | INTRAVENOUS | Status: DC
  Administered 2012-04-25: 400 mg via INTRAVENOUS

## 2012-04-25 MED ORDER — OXYCODONE-ACETAMINOPHEN 5-325 MG PO TABS
ORAL_TABLET | ORAL | Status: AC
  Administered 2012-04-25: 1 via ORAL
  Filled 2012-04-25: qty 1

## 2012-04-25 MED ORDER — PROPOFOL 10 MG/ML IV EMUL
INTRAVENOUS | Status: DC | PRN
  Administered 2012-04-25: 200 mg via INTRAVENOUS

## 2012-04-25 MED ORDER — GLYCOPYRROLATE 0.2 MG/ML IJ SOLN
INTRAMUSCULAR | Status: AC
  Administered 2012-04-25: 0.2 mg via INTRAVENOUS
  Filled 2012-04-25: qty 1

## 2012-04-25 MED ORDER — ONDANSETRON HCL 4 MG/2ML IJ SOLN
4.0000 mg | Freq: Once | INTRAMUSCULAR | Status: AC
  Administered 2012-04-25: 4 mg via INTRAVENOUS

## 2012-04-25 MED ORDER — FENTANYL CITRATE 0.05 MG/ML IJ SOLN
INTRAMUSCULAR | Status: DC | PRN
  Administered 2012-04-25 (×2): 25 ug via INTRAVENOUS
  Administered 2012-04-25 (×3): 50 ug via INTRAVENOUS

## 2012-04-25 MED ORDER — ONDANSETRON HCL 4 MG/2ML IJ SOLN: 4.0000 mg | Freq: Once | INTRAMUSCULAR | Status: DC | PRN

## 2012-04-25 MED ORDER — LIDOCAINE HCL (PF) 1 % IJ SOLN
INTRAMUSCULAR | Status: AC
  Filled 2012-04-25: qty 5

## 2012-04-25 MED ORDER — ONDANSETRON HCL 8 MG PO TABS: 8.0000 mg | ORAL_TABLET | Freq: Three times a day (TID) | ORAL | Status: AC | PRN

## 2012-04-25 MED ORDER — LIDOCAINE HCL (CARDIAC) 10 MG/ML IV SOLN
INTRAVENOUS | Status: DC | PRN
  Administered 2012-04-25: 50 mg via INTRAVENOUS

## 2012-04-25 MED ORDER — ACETAMINOPHEN 325 MG PO TABS: 325.0000 mg | ORAL_TABLET | ORAL | Status: DC | PRN

## 2012-04-25 MED ORDER — SODIUM CHLORIDE 0.9 % IR SOLN
Status: DC | PRN
  Administered 2012-04-25: 1000 mL
  Administered 2012-04-25: 3000 mL

## 2012-04-25 MED ORDER — KETOROLAC TROMETHAMINE 10 MG PO TABS: 10.0000 mg | ORAL_TABLET | Freq: Three times a day (TID) | ORAL | Status: AC | PRN

## 2012-04-25 MED ORDER — LACTATED RINGERS IV SOLN
INTRAVENOUS | Status: DC | PRN
  Administered 2012-04-25: 07:00:00 via INTRAVENOUS

## 2012-04-25 MED ORDER — PROPOFOL 10 MG/ML IV EMUL
INTRAVENOUS | Status: AC
  Filled 2012-04-25: qty 20

## 2012-04-25 MED ORDER — DIPHENHYDRAMINE HCL 50 MG/ML IJ SOLN
25.0000 mg | Freq: Once | INTRAMUSCULAR | Status: AC
  Administered 2012-04-25: 25 mg via INTRAVENOUS

## 2012-04-25 MED ORDER — KETOROLAC TROMETHAMINE 30 MG/ML IJ SOLN
INTRAMUSCULAR | Status: AC
  Administered 2012-04-25: 30 mg via INTRAVENOUS
  Filled 2012-04-25: qty 1

## 2012-04-25 MED ORDER — MIDAZOLAM HCL 2 MG/2ML IJ SOLN
1.0000 mg | INTRAMUSCULAR | Status: DC | PRN
  Administered 2012-04-25: 2 mg via INTRAVENOUS

## 2012-04-25 SURGICAL SUPPLY — 32 items
BAG DECANTER FOR FLEXI CONT (MISCELLANEOUS) ×3 IMPLANT
BAG HAMPER (MISCELLANEOUS) ×3 IMPLANT
CATH ROBINSON RED A/P 14FR (CATHETERS) ×2 IMPLANT
CATH THERMACHOICE III (CATHETERS) ×3 IMPLANT
CLOTH BEACON ORANGE TIMEOUT ST (SAFETY) ×3 IMPLANT
COVER LIGHT HANDLE STERIS (MISCELLANEOUS) ×6 IMPLANT
COVER MAYO STAND XLG (DRAPE) ×2 IMPLANT
FORMALIN 10 PREFIL 120ML (MISCELLANEOUS) ×3 IMPLANT
GAUZE SPONGE 4X4 16PLY XRAY LF (GAUZE/BANDAGES/DRESSINGS) ×3 IMPLANT
GLOVE BIOGEL PI IND STRL 7.0 (GLOVE) ×2 IMPLANT
GLOVE BIOGEL PI IND STRL 8 (GLOVE) ×2 IMPLANT
GLOVE BIOGEL PI INDICATOR 7.0 (GLOVE) ×2
GLOVE BIOGEL PI INDICATOR 8 (GLOVE) ×1
GLOVE ECLIPSE 6.5 STRL STRAW (GLOVE) ×2 IMPLANT
GLOVE ECLIPSE 8.0 STRL XLNG CF (GLOVE) ×3 IMPLANT
GLOVE EXAM NITRILE MD LF STRL (GLOVE) ×2 IMPLANT
GOWN STRL REIN XL XLG (GOWN DISPOSABLE) ×8 IMPLANT
INST SET HYSTEROSCOPY (KITS) ×3 IMPLANT
IV D5W 500ML (IV SOLUTION) ×3 IMPLANT
IV NS IRRIG 3000ML ARTHROMATIC (IV SOLUTION) ×3 IMPLANT
KIT ROOM TURNOVER APOR (KITS) ×3 IMPLANT
MANIFOLD NEPTUNE II (INSTRUMENTS) ×3 IMPLANT
MARKER SKIN DUAL TIP RULER LAB (MISCELLANEOUS) ×3 IMPLANT
NS IRRIG 1000ML POUR BTL (IV SOLUTION) ×3 IMPLANT
PACK BASIC III (CUSTOM PROCEDURE TRAY) ×3
PACK SRG BSC III STRL LF ECLPS (CUSTOM PROCEDURE TRAY) ×2 IMPLANT
PAD ARMBOARD 7.5X6 YLW CONV (MISCELLANEOUS) ×3 IMPLANT
PAD TELFA 3X4 1S STER (GAUZE/BANDAGES/DRESSINGS) ×3 IMPLANT
SET BASIN LINEN APH (SET/KITS/TRAYS/PACK) ×3 IMPLANT
SET IRRIG Y TYPE TUR BLADDER L (SET/KITS/TRAYS/PACK) ×3 IMPLANT
SHEET LAVH (DRAPES) ×3 IMPLANT
YANKAUER SUCT BULB TIP 10FT TU (MISCELLANEOUS) ×3 IMPLANT

## 2012-04-25 NOTE — Anesthesia Procedure Notes (Signed)
Procedure Name: LMA Insertion Date/Time: 04/25/2012 8:16 AM Performed by: Carolyne Littles, Amer Alcindor L Pre-anesthesia Checklist: Patient identified, Patient being monitored, Emergency Drugs available, Timeout performed and Suction available Patient Re-evaluated:Patient Re-evaluated prior to inductionOxygen Delivery Method: Circle system utilized Preoxygenation: Pre-oxygenation with 100% oxygen Intubation Type: IV induction Ventilation: Mask ventilation without difficulty LMA: LMA inserted LMA Size: 4.0 Number of attempts: 1 Placement Confirmation: positive ETCO2 and breath sounds checked- equal and bilateral Tube secured with: Tape Dental Injury: Teeth and Oropharynx as per pre-operative assessment

## 2012-04-25 NOTE — Addendum Note (Signed)
Addendum  created 04/25/12 0959 by Kataya Guimont L Andraza, CRNA   Modules edited:Anesthesia Medication Administration    

## 2012-04-25 NOTE — Preoperative (Signed)
Beta Blockers   Reason not to administer Beta Blockers:Not Applicable 

## 2012-04-25 NOTE — Op Note (Signed)
Preoperative diagnosis: Menometrorrhagia                                        Dysmenorrhea   Postoperative diagnoses: Same as above   Procedure: Hysteroscopy,  endometrial ablation  Surgeon: Despina Hidden MD  Anesthesia: Laryngeal mask airway  Findings: The endometrium was normal. There were no fibroid or other abnormalities.  Description of operation: The patient was taken to the operating room and placed in the supine position. She underwent general anesthesia using the laryngeal mask airway. She was placed in the dorsal lithotomy position and prepped and draped in the usual sterile fashion. A Graves speculum was placed and the anterior cervical lip was grasped with a single-tooth tenaculum. The cervix was dilated serially to allow passage of the hysteroscope. Diagnostic hysteroscopy was performed and was found to be normal. Her cervix had no descent and her vagina was very long so i decided against curettage.  By sonogram her endometrium was very thinned from the megestrol. The ThermaChoice 3 endometrial ablation balloon was then used were 34 cc of D5W was required to maintain a pressure of 190-200 mm of mercury throughout the procedure. Toatl therapy time was 11:42.  All of the equipment worked well throughout the procedure. All of the fluid was returned at the end of the procedure. The patient was awakened from anesthesia and taken to the recovery room in good stable condition all counts were correct. She received Cipro and had itching and a rash so that was stopped and she received cleocin 900 mg IV and 30 mg of Toradol preoperatively. She will be discharged from the recovery room and followed up in the office in 2 weeks.  [name] H 04/25/2012 8:59 AM

## 2012-04-25 NOTE — Anesthesia Postprocedure Evaluation (Signed)
  Anesthesia Post-op Note  Patient: [name]  Procedure(s) Performed: Procedure(s) (LRB): HYSTEROSCOPY WITH THERMACHOICE (N/A)  Patient Location: PACU  Anesthesia Type: General  Level of Consciousness: awake, oriented and patient cooperative  Airway and Oxygen Therapy: Patient Spontanous Breathing and Patient connected to face mask oxygen  Post-op Pain: mild  Post-op Assessment: Post-op Vital signs reviewed, Patient's Cardiovascular Status Stable, Respiratory Function Stable, Patent Airway and No signs of Nausea or vomiting  Post-op Vital Signs: Reviewed and stable  Complications: No apparent anesthesia complications

## 2012-04-25 NOTE — Addendum Note (Signed)
Addendum  created 04/25/12 0959 by Marolyn Hammock, CRNA   Modules edited:Anesthesia Medication Administration

## 2012-04-25 NOTE — Transfer of Care (Signed)
Immediate Anesthesia Transfer of Care Note  Patient: [name]  Procedure(s) Performed: Procedure(s) (LRB): HYSTEROSCOPY WITH THERMACHOICE (N/A)  Patient Location: PACU  Anesthesia Type: General  Level of Consciousness: awake, oriented and patient cooperative  Airway & Oxygen Therapy: Patient Spontanous Breathing and Patient connected to face mask oxygen  Post-op Assessment: Report given to PACU RN and Post -op Vital signs reviewed and stable  Post vital signs: Reviewed and stable  Complications: No apparent anesthesia complications

## 2012-04-25 NOTE — Anesthesia Preprocedure Evaluation (Signed)
Anesthesia Evaluation  Patient identified by MRN, date of birth, ID band Patient awake    Reviewed: Allergy & Precautions, H&P , NPO status , Patient's Chart, lab work & pertinent test results  Airway Mallampati: II TM Distance: >3 FB Neck ROM: Full    Dental No notable dental hx.    Pulmonary    Pulmonary exam normal       Cardiovascular negative cardio ROS  Rhythm:Regular Rate:Normal     Neuro/Psych PSYCHIATRIC DISORDERS Depression  Neuromuscular disease    GI/Hepatic negative GI ROS, Neg liver ROS,   Endo/Other  Diabetes mellitus-, Type 2, Oral Hypoglycemic Agents  Renal/GU negative Renal ROS     Musculoskeletal negative musculoskeletal ROS (+)   Abdominal (+) + obese,  Abdomen: soft.    Peds  Hematology negative hematology ROS (+)   Anesthesia Other Findings   Reproductive/Obstetrics negative OB ROS                           Anesthesia Physical Anesthesia Plan  ASA: II  Anesthesia Plan: General   Post-op Pain Management:    Induction: Intravenous  Airway Management Planned: LMA  Additional Equipment:   Intra-op Plan:   Post-operative Plan: Extubation in OR  Informed Consent: I have reviewed the patients History and Physical, chart, labs and discussed the procedure including the risks, benefits and alternatives for the proposed anesthesia with the patient or authorized representative who has indicated his/her understanding and acceptance.   Dental advisory given  Plan Discussed with: CRNA  Anesthesia Plan Comments:         Anesthesia Quick Evaluation

## 2012-04-25 NOTE — H&P (Signed)
[name] is an 46 y.o. female with increasingly heavy painful menstrual periods.  Sonogram is normal, patient did respond to megestrol.  Patient desired to proceed with ablation.  Patient's last menstrual period was 01/27/2012.    Past Medical History  Diagnosis Date  . Diabetes mellitus     Controlled by diet    Past Surgical History  Procedure Date  . Cesarean section 1994;2000    [place]  . Cholecystectomy 1992  . Sinus sergery 1988    [place]  . Foot surgery March, 2013    [facility]    Family History  Problem Relation Age of Onset  . Heart disease    . Cancer    . Diabetes      Social History:  reports that she has never smoked. She does not have any smokeless tobacco history on file. She reports that she does not drink alcohol or use illicit drugs.  Allergies:  Allergies  Allergen Reactions  . Codeine     REACTION: Had wisdom teeth out and had hallicunations  . Morphine Nausea And Vomiting and Other (See Comments)    REACTION:   recieved in ED due to [facility] and had multple doses - gave visual hallucinations and vomitting.  Marland Kitchen Penicillins     REACTION: Rash  . Sulfonamide Derivatives     REACTION: Unsure - was child when hse had this    Prescriptions prior to admission  Medication Sig Dispense Refill  . HYDROcodone-acetaminophen (NORCO) 5-325 MG per tablet Take 1 tablet by mouth every 6 (six) hours as needed for pain.  42 tablet  1  . megestrol (MEGACE) 40 MG tablet Take 40 mg by mouth every evening.        ROS  Review of Systems  Constitutional: Negative for fever, chills, weight loss, malaise/fatigue and diaphoresis.  HENT: Negative for hearing loss, ear pain, nosebleeds, congestion, sore throat, neck pain, tinnitus and ear discharge.   Eyes: Negative for blurred vision, double vision, photophobia, pain, discharge and redness.  Respiratory: Negative for cough, hemoptysis, sputum production, shortness of breath, wheezing and stridor.     Cardiovascular: Negative for chest pain, palpitations, orthopnea, claudication, leg swelling and PND.  Gastrointestinal: Negative for abdominal pain. Negative for heartburn, nausea, vomiting, diarrhea, constipation, blood in stool and melena.  Genitourinary: Negative for dysuria, urgency, frequency, hematuria and flank pain.  Musculoskeletal: Negative for myalgias, back pain, joint pain and falls.  Skin: Negative for itching and rash.  Neurological: Negative for dizziness, tingling, tremors, sensory change, speech change, focal weakness, seizures, loss of consciousness, weakness and headaches.  Endo/Heme/Allergies: Negative for environmental allergies and polydipsia. Does not bruise/bleed easily.  Psychiatric/Behavioral: Negative for depression, suicidal ideas, hallucinations, memory loss and substance abuse. The patient is not nervous/anxious and does not have insomnia.      Blood pressure 138/87, pulse 76, temperature 98.5 F (36.9 C), temperature source Oral, resp. rate 18, last menstrual period 01/27/2012, SpO2 99.00%. Physical Exam Physical Exam  Vitals reviewed. Constitutional: She is oriented to person, place, and time. She appears well-developed and well-nourished.  HENT:  Head: Normocephalic and atraumatic.  Right Ear: External ear normal.  Left Ear: External ear normal.  Nose: Nose normal.  Mouth/Throat: Oropharynx is clear and moist.  Eyes: Conjunctivae and EOM are normal. Pupils are equal, round, and reactive to light. Right eye exhibits no discharge. Left eye exhibits no discharge. No scleral icterus.  Neck: Normal range of motion. Neck supple. No tracheal deviation present. No thyromegaly present.  Cardiovascular: Normal rate, regular rhythm, normal heart sounds and intact distal pulses.  Exam reveals no gallop and no friction rub.   No murmur heard. Respiratory: Effort normal and breath sounds normal. No respiratory distress. She has no wheezes. She has no rales. She  exhibits no tenderness.  GI: Soft. Bowel sounds are normal. She exhibits no distension and no mass. There is no tenderness. There is no rebound and no guarding.  Genitourinary:       Vulva is normal without lesions Vagina is pink moist without discharge Cervix normal in appearance and pap is normal Uterus is normal sized  Adnexa is negative with normal sized ovaries by sonogram  Musculoskeletal: Normal range of motion. She exhibits no edema and no tenderness.  Neurological: She is alert and oriented to person, place, and time. She has normal reflexes. She displays normal reflexes. No cranial nerve deficit. She exhibits normal muscle tone. Coordination normal.  Skin: Skin is warm and dry. No rash noted. No erythema. No pallor.  Psychiatric: She has a normal mood and affect. Her behavior is normal. Judgment and thought content normal.     Results for orders placed during the hospital encounter of 04/25/12 (from the past 24 hour(s))  GLUCOSE, CAPILLARY     Status: Abnormal   Collection Time   04/25/12  7:00 AM      Component Value Range   Glucose-Capillary 142 (*) 70 - 99 (mg/dL)    No results found.  Assessment/Plan: 1.  Menometrorrhagia  2.  Dysmenorrhea  Patien understands the risks of the procedure including failure.  [name] 04/25/2012, 7:33 AM

## 2012-04-25 NOTE — Discharge Instructions (Signed)
Follow up May 23th at 4:00pm    Endometrial Ablation Endometrial ablation removes the lining of the uterus (endometrium). It is usually a same day, outpatient treatment. Ablation helps avoid major surgery (such as a hysterectomy). A hysterectomy is removal of the cervix and uterus. Endometrial ablation has less risk and complications, has a shorter recovery period and is less expensive. After endometrial ablation, most women will have little or no menstrual bleeding. You may not keep your fertility. Pregnancy is no longer likely after this procedure but if you are pre-menopausal, you still need to use a reliable method of birth control following the procedure because pregnancy can occur. REASONS TO HAVE THE PROCEDURE MAY INCLUDE:  Heavy periods.   Bleeding that is causing anemia.   Anovulatory bleeding, very irregular, bleeding.   Bleeding submucous fibroids (on the lining inside the uterus) if they are smaller than 3 centimeters.  REASONS NOT TO HAVE THE PROCEDURE MAY INCLUDE:  You wish to have more children.   You have a pre-cancerous or cancerous problem. The cause of any abnormal bleeding must be diagnosed before having the procedure.   You have pain coming from the uterus.   You have a submucus fibroid larger than 3 centimeters.   You recently had a baby.   You recently had an infection in the uterus.   You have a severe retro-flexed, tipped uterus and cannot insert the instrument to do the ablation.   You had a Cesarean section or deep major surgery on the uterus.   The inner cavity of the uterus is too large for the endometrial ablation instrument.  RISKS AND COMPLICATIONS   Perforation of the uterus.   Bleeding.   Infection of the uterus, bladder or vagina.   Injury to surrounding organs.   Cutting the cervix.   An air bubble to the lung (air embolus).   Pregnancy following the procedure.   Failure of the procedure to help the problem requiring hysterectomy.     Decreased ability to diagnose cancer in the lining of the uterus.  BEFORE THE PROCEDURE  The lining of the uterus must be tested to make sure there is no pre-cancerous or cancer cells present.   Medications may be given to make the lining of the uterus thinner.   Ultrasound may be used to evaluate the size and look for abnormalities of the uterus.   Future pregnancy is not desired.  PROCEDURE  There are different ways to destroy the lining of the uterus.   Resectoscope - radio frequency-alternating electric current is the most common one used.   Cryotherapy - freezing the lining of the uterus.   Heated Free Liquid - heated salt (saline) solution inserted into the uterus.   Microwave - uses high energy microwaves in the uterus.   Thermal Balloon - a catheter with a balloon tip is inserted into the uterus and filled with heated fluid.  Your caregiver will talk with you about the method used in this clinic. They will also instruct you on the pros and cons of the procedure. Endometrial ablation is performed along with a procedure called operative hysteroscopy. A narrow viewing tube is inserted through the birth canal (vagina) and through the cervix into the uterus. A tiny camera attached to the viewing tube (hysteroscope) allows the uterine cavity to be shown on a TV monitor during surgery. Your uterus is filled with a harmless liquid to make the procedure easier. The lining of the uterus is then removed. The lining can  also be removed with a resectoscope which allows your surgeon to cut away the lining of the uterus under direct vision. Usually, you will be able to go home within an hour after the procedure. HOME CARE INSTRUCTIONS   Do not drive for 24 hours.   No tampons, douching or intercourse for 2 weeks or until your caregiver approves.   Rest at home for 24 to 48 hours. You may then resume normal activities unless told differently by your caregiver.   Take your temperature two  times a day for 4 days, and record it.   Take any medications your caregiver has ordered, as directed.   Use some form of contraception if you are pre-menopausal and do not want to get pregnant.  Bleeding after the procedure is normal. It varies from light spotting and mildly watery to bloody discharge for 4 to 6 weeks. You may also have mild cramping. Only take over-the-counter or prescription medicines for pain, discomfort, or fever as directed by your caregiver. Do not use aspirin, as this may aggravate bleeding. Frequent urination during the first 24 hours is normal. You will not know how effective your surgery is until at least 3 months after the surgery. SEEK IMMEDIATE MEDICAL CARE IF:   Bleeding is heavier than a normal menstrual cycle.   An oral temperature above 102 F (38.9 C) develops.   You have increasing cramps or pains not relieved with medication or develop belly (abdominal) pain which does not seem to be related to the same area of earlier cramping and pain.   You are light headed, weak or have fainting episodes.   You develop pain in the shoulder strap areas.   You have chest or leg pain.   You have abnormal vaginal discharge.   You have painful urination.  Document Released: 10/05/2004 Document Revised: 11/15/2011 Document Reviewed: 01/03/2008 [facility] Patient Information 2012 [place], [place].

## 2012-04-28 ENCOUNTER — Encounter (HOSPITAL_COMMUNITY): Payer: Self-pay | Admitting: Obstetrics & Gynecology

## 2012-04-28 ENCOUNTER — Ambulatory Visit (HOSPITAL_COMMUNITY)
Admission: RE | Admit: 2012-04-28 | Discharge: 2012-04-28 | Disposition: A | Discharge status: Home / Self Care | Payer: No Typology Code available for payment source | Source: Ambulatory Visit | Attending: Internal Medicine | Admitting: Internal Medicine

## 2012-04-28 DIAGNOSIS — M79609 Pain in unspecified limb: Secondary | ICD-10-CM | POA: Insufficient documentation

## 2012-04-28 DIAGNOSIS — M79604 Pain in right leg: Secondary | ICD-10-CM

## 2012-04-30 ENCOUNTER — Ambulatory Visit (INDEPENDENT_AMBULATORY_CARE_PROVIDER_SITE_OTHER): Payer: No Typology Code available for payment source | Admitting: Orthopedic Surgery

## 2012-04-30 ENCOUNTER — Encounter: Payer: Self-pay | Admitting: Orthopedic Surgery

## 2012-04-30 VITALS — BP 100/60 | Ht <= 58 in | Wt 175.0 lb

## 2012-04-30 DIAGNOSIS — M542 Cervicalgia: Secondary | ICD-10-CM

## 2012-04-30 DIAGNOSIS — M75100 Unspecified rotator cuff tear or rupture of unspecified shoulder, not specified as traumatic: Secondary | ICD-10-CM

## 2012-04-30 DIAGNOSIS — M67919 Unspecified disorder of synovium and tendon, unspecified shoulder: Secondary | ICD-10-CM

## 2012-04-30 MED ORDER — PREDNISONE 10 MG PO KIT: 1.0000 | PACK | ORAL | Status: DC

## 2012-04-30 NOTE — Patient Instructions (Signed)
Start therapy  Medication has been sent to your pharmacy

## 2012-04-30 NOTE — Progress Notes (Signed)
Subjective:     Patient ID: [name], female   DOB: Jun 30, 1966, 46 y.o.   MRN: [id number] Chief Complaint  Patient presents with  . Follow-up    2 week recheck right shoulder   BP 100/60  Ht 4\' 8"  (1.422 m)  Wt 175 lb (79.379 kg)  BMI 39.23 kg/m2  LMP 01/17/2012  HPI [name] is a 46 y.o. female who presents with RIGHT shoulder pain radiating up into the neck. No trauma. The patient reports sharp throbbing, stabbing, 10 out of 10, constant intermittent pain, unrelieved by ibuprofen. Associated numbness and tingling, and painful forward elevation. Injected right shoulder 2 weeks ago   Review of Systems Still having neck pain, and some shoulder pain    Objective:   Physical Exam Her impingement sign is positive, but seems improved. Her Hawkins maneuver is normal need shoulder is stable. Strength is normal tenderness in the cervical spine continues with some tightness of the RIGHT paracervical musculature    Assessment:     #1 impingement syndrome, improving #2Cervicalgia   Plan:     #1 physical therapy #2, cervicalgia recommend physical therapy as well.  It is difficult to assess, which is the primary process here. It may be 2 separate processes.  Recommend start prednisone stop Naprosyn. Return in 6 weeks

## 2012-05-07 ENCOUNTER — Encounter (HOSPITAL_COMMUNITY): Payer: Self-pay

## 2012-05-07 ENCOUNTER — Inpatient Hospital Stay (HOSPITAL_COMMUNITY)
Admission: EM | Admit: 2012-05-07 | Discharge: 2012-05-10 | DRG: 694 | Disposition: A | Discharge status: Home / Self Care | Payer: No Typology Code available for payment source | Attending: Internal Medicine | Admitting: Internal Medicine

## 2012-05-07 ENCOUNTER — Emergency Department (HOSPITAL_COMMUNITY): Payer: No Typology Code available for payment source

## 2012-05-07 DIAGNOSIS — E669 Obesity, unspecified: Secondary | ICD-10-CM | POA: Diagnosis present

## 2012-05-07 DIAGNOSIS — I10 Essential (primary) hypertension: Secondary | ICD-10-CM | POA: Diagnosis present

## 2012-05-07 DIAGNOSIS — N200 Calculus of kidney: Principal | ICD-10-CM | POA: Diagnosis present

## 2012-05-07 DIAGNOSIS — D649 Anemia, unspecified: Secondary | ICD-10-CM | POA: Diagnosis present

## 2012-05-07 DIAGNOSIS — R109 Unspecified abdominal pain: Secondary | ICD-10-CM | POA: Diagnosis present

## 2012-05-07 DIAGNOSIS — N390 Urinary tract infection, site not specified: Secondary | ICD-10-CM | POA: Diagnosis present

## 2012-05-07 DIAGNOSIS — E782 Mixed hyperlipidemia: Secondary | ICD-10-CM | POA: Diagnosis present

## 2012-05-07 DIAGNOSIS — E7800 Pure hypercholesterolemia, unspecified: Secondary | ICD-10-CM | POA: Diagnosis present

## 2012-05-07 DIAGNOSIS — Z6841 Body Mass Index (BMI) 40.0 and over, adult: Secondary | ICD-10-CM

## 2012-05-07 DIAGNOSIS — N1330 Unspecified hydronephrosis: Secondary | ICD-10-CM | POA: Diagnosis present

## 2012-05-07 DIAGNOSIS — IMO0001 Reserved for inherently not codable concepts without codable children: Secondary | ICD-10-CM | POA: Diagnosis present

## 2012-05-07 DIAGNOSIS — R739 Hyperglycemia, unspecified: Secondary | ICD-10-CM

## 2012-05-07 DIAGNOSIS — E1165 Type 2 diabetes mellitus with hyperglycemia: Secondary | ICD-10-CM | POA: Diagnosis present

## 2012-05-07 DIAGNOSIS — E119 Type 2 diabetes mellitus without complications: Secondary | ICD-10-CM

## 2012-05-07 DIAGNOSIS — E785 Hyperlipidemia, unspecified: Secondary | ICD-10-CM | POA: Diagnosis present

## 2012-05-07 DIAGNOSIS — IMO0002 Reserved for concepts with insufficient information to code with codable children: Secondary | ICD-10-CM | POA: Diagnosis present

## 2012-05-07 LAB — SURGICAL PCR SCREEN
MRSA, PCR: NEGATIVE
Staphylococcus aureus: NEGATIVE

## 2012-05-07 LAB — CBC
Hemoglobin: 12.4 g/dL (ref 12.0–15.0)
MCV: 79.2 fL (ref 78.0–100.0)
Platelets: 399 10*3/uL (ref 150–400)
RBC: 4.72 MIL/uL (ref 3.87–5.11)
WBC: 15.1 10*3/uL — ABNORMAL HIGH (ref 4.0–10.5)

## 2012-05-07 LAB — GLUCOSE, CAPILLARY: Glucose-Capillary: 383 mg/dL — ABNORMAL HIGH (ref 70–99)

## 2012-05-07 LAB — URINALYSIS, ROUTINE W REFLEX MICROSCOPIC
Bilirubin Urine: NEGATIVE
Glucose, UA: >1000 mg/dL — AB
Ketones, ur: NEGATIVE mg/dL
Protein, ur: NEGATIVE mg/dL
Urobilinogen, UA: 0.2 mg/dL (ref 0.0–1.0)

## 2012-05-07 LAB — URINE MICROSCOPIC-ADD ON

## 2012-05-07 LAB — BASIC METABOLIC PANEL
CO2: 21 mEq/L (ref 19–32)
Calcium: 8.6 mg/dL (ref 8.4–10.5)
Potassium: 3.4 mEq/L — ABNORMAL LOW (ref 3.5–5.1)
Sodium: 128 mEq/L — ABNORMAL LOW (ref 135–145)

## 2012-05-07 MED ORDER — HYDROMORPHONE HCL PF 1 MG/ML IJ SOLN
1.0000 mg | Freq: Once | INTRAMUSCULAR | Status: AC
  Administered 2012-05-07: 1 mg via INTRAVENOUS
  Filled 2012-05-07: qty 1

## 2012-05-07 MED ORDER — NALOXONE HCL 0.4 MG/ML IJ SOLN: 0.4000 mg | INTRAMUSCULAR | Status: DC | PRN

## 2012-05-07 MED ORDER — ONDANSETRON HCL 4 MG/2ML IJ SOLN
4.0000 mg | Freq: Once | INTRAMUSCULAR | Status: AC
  Administered 2012-05-07: 4 mg via INTRAVENOUS
  Filled 2012-05-07: qty 2

## 2012-05-07 MED ORDER — SODIUM CHLORIDE 0.9 % IJ SOLN
9.0000 mL | INTRAMUSCULAR | Status: DC | PRN
  Filled 2012-05-07: qty 3

## 2012-05-07 MED ORDER — SODIUM CHLORIDE 0.9 % IV SOLN: INTRAVENOUS | Status: DC

## 2012-05-07 MED ORDER — INSULIN ASPART 100 UNIT/ML ~~LOC~~ SOLN
0.0000 [IU] | Freq: Three times a day (TID) | SUBCUTANEOUS | Status: DC
  Administered 2012-05-07: 5 [IU] via SUBCUTANEOUS
  Administered 2012-05-07: 11 [IU] via SUBCUTANEOUS
  Administered 2012-05-08: 3 [IU] via SUBCUTANEOUS
  Administered 2012-05-08: 5 [IU] via SUBCUTANEOUS
  Administered 2012-05-09: 3 [IU] via SUBCUTANEOUS
  Administered 2012-05-09: 5 [IU] via SUBCUTANEOUS
  Administered 2012-05-09: 3 [IU] via SUBCUTANEOUS
  Administered 2012-05-10: 2 [IU] via SUBCUTANEOUS

## 2012-05-07 MED ORDER — HYDROMORPHONE 0.3 MG/ML IV SOLN
INTRAVENOUS | Status: DC
  Administered 2012-05-07: 18:00:00 via INTRAVENOUS
  Administered 2012-05-08: 0.6 mg via INTRAVENOUS
  Administered 2012-05-08: 10:00:00 via INTRAVENOUS
  Administered 2012-05-08 – 2012-05-09 (×2): 0.6 mg via INTRAVENOUS
  Administered 2012-05-09: 1.2 mg via INTRAVENOUS
  Administered 2012-05-09: 1.5 mg via INTRAVENOUS
  Administered 2012-05-09: 0.9 mg via INTRAVENOUS
  Administered 2012-05-09 – 2012-05-10 (×2): 0.3 mg via INTRAVENOUS
  Administered 2012-05-10: 06:00:00 via INTRAVENOUS
  Administered 2012-05-10: 0.9 mg via INTRAVENOUS
  Filled 2012-05-07 (×4): qty 25

## 2012-05-07 MED ORDER — INSULIN GLARGINE 100 UNIT/ML ~~LOC~~ SOLN
30.0000 [IU] | Freq: Every day | SUBCUTANEOUS | Status: DC
  Administered 2012-05-07: 30 [IU] via SUBCUTANEOUS

## 2012-05-07 MED ORDER — SODIUM CHLORIDE 0.9 % IV SOLN
INTRAVENOUS | Status: DC
  Filled 2012-05-07: qty 1

## 2012-05-07 MED ORDER — ONDANSETRON HCL 4 MG/2ML IJ SOLN
4.0000 mg | Freq: Three times a day (TID) | INTRAMUSCULAR | Status: AC | PRN
  Administered 2012-05-07: 4 mg via INTRAVENOUS
  Filled 2012-05-07: qty 2

## 2012-05-07 MED ORDER — ONDANSETRON HCL 4 MG/2ML IJ SOLN: 4.0000 mg | Freq: Four times a day (QID) | INTRAMUSCULAR | Status: DC | PRN

## 2012-05-07 MED ORDER — CEFTRIAXONE SODIUM 1 G IJ SOLR
1.0000 g | Freq: Once | INTRAMUSCULAR | Status: AC
  Administered 2012-05-07: 1 g via INTRAVENOUS
  Filled 2012-05-07: qty 10

## 2012-05-07 MED ORDER — DIPHENHYDRAMINE HCL 12.5 MG/5ML PO ELIX: 12.5000 mg | ORAL_SOLUTION | Freq: Four times a day (QID) | ORAL | Status: DC | PRN

## 2012-05-07 MED ORDER — ENOXAPARIN SODIUM 40 MG/0.4ML ~~LOC~~ SOLN
40.0000 mg | SUBCUTANEOUS | Status: DC
  Administered 2012-05-07: 40 mg via SUBCUTANEOUS
  Filled 2012-05-07: qty 0.4

## 2012-05-07 MED ORDER — INSULIN ASPART 100 UNIT/ML ~~LOC~~ SOLN
10.0000 [IU] | Freq: Three times a day (TID) | SUBCUTANEOUS | Status: DC
  Administered 2012-05-07: 10 [IU] via SUBCUTANEOUS

## 2012-05-07 MED ORDER — HYDROMORPHONE HCL PF 1 MG/ML IJ SOLN
1.0000 mg | INTRAMUSCULAR | Status: AC | PRN
  Administered 2012-05-07 (×2): 1 mg via INTRAVENOUS
  Filled 2012-05-07 (×2): qty 1

## 2012-05-07 MED ORDER — SODIUM CHLORIDE 0.9 % IV SOLN
INTRAVENOUS | Status: AC
  Administered 2012-05-07 (×2): via INTRAVENOUS

## 2012-05-07 MED ORDER — DEXTROSE 5 % IV SOLN
1.0000 g | INTRAVENOUS | Status: DC
  Administered 2012-05-07 – 2012-05-09 (×3): 1 g via INTRAVENOUS
  Filled 2012-05-07 (×6): qty 10

## 2012-05-07 MED ORDER — DIPHENHYDRAMINE HCL 50 MG/ML IJ SOLN: 12.5000 mg | Freq: Four times a day (QID) | INTRAMUSCULAR | Status: DC | PRN

## 2012-05-07 MED ORDER — INSULIN GLARGINE 100 UNIT/ML ~~LOC~~ SOLN: 20.0000 [IU] | Freq: Every day | SUBCUTANEOUS | Status: DC

## 2012-05-07 MED ORDER — KETOROLAC TROMETHAMINE 30 MG/ML IJ SOLN
30.0000 mg | Freq: Once | INTRAMUSCULAR | Status: AC
  Administered 2012-05-07: 30 mg via INTRAVENOUS
  Filled 2012-05-07: qty 1

## 2012-05-07 MED ORDER — INSULIN ASPART 100 UNIT/ML ~~LOC~~ SOLN
10.0000 [IU] | Freq: Once | SUBCUTANEOUS | Status: AC
  Administered 2012-05-07: 10 [IU] via INTRAVENOUS
  Filled 2012-05-07: qty 10

## 2012-05-07 NOTE — Consult Note (Signed)
ANTICOAGULATION CONSULT NOTE - Initial Consult  Pharmacy Consult for Lovenox Indication: VTE prophylaxis  Allergies  Allergen Reactions  . Ciprofloxacin Itching  . Codeine     REACTION: Had wisdom teeth out and had hallicunations  . Morphine Nausea And Vomiting and Other (See Comments)    REACTION:   recieved in ED due to [facility] and had multple doses - gave visual hallucinations and vomitting.  . Morphine And Related Nausea And Vomiting  . Penicillins     REACTION: Rash  . Sulfonamide Derivatives     REACTION: Unsure - was child when hse had this    Patient Measurements: Height: 4\' 8"  (142.2 cm) Weight: 192 lb 10.9 oz (87.4 kg) IBW/kg (Calculated) : 36.3   Vital Signs: Temp: 99.3 F (37.4 C) (05/29 1130) Temp src: Oral (05/29 1130) BP: 122/88 mmHg (05/29 1130) Pulse Rate: 89  (05/29 1130)  Labs:  [facility] 05/07/12 0721  HGB 12.4  HCT 37.4  PLT 399  APTT --  LABPROT --  INR --  HEPARINUNFRC --  CREATININE 1.02  CKTOTAL --  CKMB --  TROPONINI --    Estimated Creatinine Clearance: 62.3 ml/min (by C-G formula based on Cr of 1.02).   Medical History: Past Medical History  Diagnosis Date  . Diabetes mellitus     Controlled by diet  . Cancer     Medications:  Scheduled:    . sodium chloride   Intravenous STAT  . cefTRIAXone (ROCEPHIN)  IV  1 g Intravenous Once  . cefTRIAXone (ROCEPHIN)  IV  1 g Intravenous Q24H  . enoxaparin (LOVENOX) injection  40 mg Subcutaneous Q24H  .  HYDROmorphone (DILAUDID) injection  1 mg Intravenous Once  .  HYDROmorphone (DILAUDID) injection  1 mg Intravenous Once  .  HYDROmorphone (DILAUDID) injection  1 mg Intravenous Once  . insulin aspart  0-15 Units Subcutaneous TID WC  . insulin aspart  10 Units Intravenous Once  . insulin glargine  20 Units Subcutaneous QHS  . ketorolac  30 mg Intravenous Once  . ondansetron  4 mg Intravenous Once    Assessment: Good renal fxn Goal of Therapy:  vte prophylaxis Monitor  platelets by anticoagulation protocol: Yes   Plan: Lovenox 40mg  sq q24hrs CBC per protocol  Valrie Hart A 05/07/2012,12:00 PM

## 2012-05-07 NOTE — Consult Note (Signed)
NAMEJENIYA, [name] NO.:  [id number]  MEDICAL RECORD NO.:  [id number]  LOCATION:  A310                          FACILITY:  [facility]  PHYSICIAN:  Purcell Nails, MD DATE OF BIRTH:  [birth year]  DATE OF CONSULTATION:  05/07/2012 DATE OF DISCHARGE:                                CONSULTATION   REASON FOR CONSULT:  Uncontrolled type 2 diabetes.  HISTORY OF PRESENT ILLNESS:  This is a 46 year old female patient with medical history significant for hypertension, hypercholesterolemia, obesity, recently diagnosed type 2 diabetes x1 year.  She comes into the emergency room with a complaint of right flank pain.  Associated with nausea, however no vomiting.  Pain was said to be radiated to right side and she underwent CT of the abdomen without contrast.  This was significant for 2 kidney stones at the right ureteral pelvic junction with chronically progressive obstruction.  The patient is under Urology evaluation and IV hydration.  Incidentally, she was found to have significant hyperglycemia in the range of 300-400 mg/dL.  At home, she was being treated only with metformin 500 mg p.o. b.i.d.  Her last A1c was in the 7th, however no recent A1c in the system.  The patient denies any history of coronary artery disease, CVA, or CKD.  No retinopathy in the past.  PAST MEDICAL HISTORY:  As above.  PAST SURGICAL HISTORY:  Cesarean sections x2 in 1994, cholecystectomy in 1992, sinus surgery in 1988, foot surgery in March of 2013.  FAMILY HISTORY:  Significant for type 2 diabetes, unidentified cancer as well as coronary artery disease.  SOCIAL HISTORY:  Negative for smoking, alcohol, or drug use.  REVIEW OF SYSTEMS:  As in HPI.  The patient denies any hematuria or hematochezia.  No headaches, no vomiting, no change in her mental status.  The rest of the systems have been reviewed and negative.  ALLERGIES:  CIPROFLOXACIN, CODEINE, MORPHINE, PENICILLIN, AND SULFONAMIDE  DERIVATIVES.  HOME MEDICATIONS:  Include metformin 500 mg p.o. b.i.d., cyclobenzaprine 10 mg p.o. daily, and naproxen 500 mg p.o. p.r.n. every 6 hours.  PHYSICAL EXAMINATION:  GENERAL:  She is alert and oriented x3, not in acute distress.  Comfortable in her hospital bed, oriented x3 to place, person, and time.  Her husband and daughter in the room. VITAL SIGNS:  Blood pressure is 122/88, pulse rate is 89, temperature 99.3. HEENT:  No pallor.  No icterus. NECK:  Negative for JVD or thyromegaly. CHEST:  Clear to auscultation bilaterally.  CARDIOVASCULAR:  Normal S1, S2.  No murmur.  No gallop. ABDOMEN:  Obese, otherwise nontender on the left flank, however moderately tender on the right flank.  EXTREMITIES:  No edema. CNS:  Nonfocal.  LABORATORY DATA:  Sodium 128, potassium 3.4, chloride 91, bicarb 21, BUN 19, creatinine 1.02.  Calcium is 8.6.  WBC count 15.1, hemoglobin 12.4, hematocrit 37.4, platelet count 399,000.  ASSESSMENT: 1. Type 2 diabetes, acutely uncontrolled with severe hyperglycemia in     the range of 400. 2. Bilateral nephrolithiasis. 3. Abdominal pain from bilateral nephrolithiasis. 4. Hyperlipidemia. 5. Hypertension.  PLAN:  For this acute hyperglycemia, we will use intensive insulin therapy subcutaneously and holding her  metformin in case she needs contrast studies.  We will start Lantus 30 units nightly and NovoLog 10 units t.i.d. a.c. plus correction dose.  Her subsequent insulin titration will be based on her blood glucose readings.  She has a good chance to go back to her oral insulin sensitizers as an outpatient, not however while she is in the hospital.  I will continue to see her as an outpatient and inpatient, and we will titrate her insulin accordingly.  Dr. Felecia Shelling, thank you for the opportunity to participate in the care of this patient.          ______________________________ Purcell Nails, MD     GN/MEDQ  D:  05/07/2012  T:   05/07/2012  Job:  [id number]

## 2012-05-07 NOTE — ED Provider Notes (Signed)
History     CSN: [id number]  Arrival date & time 05/07/12  0543   First MD Initiated Contact with Patient 05/07/12 [phone]      Chief Complaint  Patient presents with  . Flank Pain    (Consider location/radiation/quality/duration/timing/severity/associated sxs/prior treatment) HPI  [name] is a 46 y.o. female  With a h/o kidney stones, all of which have required intervention,who presents to the Emergency Department complaining of right flank pain that began at 1 AM and been continuous.Pain has been associated with nausea, no vomiting. Pain radiated to right side making it difficult to take a deep breath.  PCP Dr. Felecia Shelling Urology Dr. Jerre Simon Past Medical History  Diagnosis Date  . Diabetes mellitus     Controlled by diet    Past Surgical History  Procedure Date  . Cesarean section 1994;2000    [place]  . Cholecystectomy 1992  . Sinus sergery 1988    [place]  . Foot surgery March, 2013    [facility]  . Hysteroscopy with thermachoice 04/25/2012    Procedure: HYSTEROSCOPY WITH THERMACHOICE;  Surgeon: Lazaro Arms, MD;  Location: [facility];  Service: Gynecology;  Laterality: N/A;  total therapy time= 11 minutes 42 seconds; 34 ml D5w  in and 34 ml D5w out; temp =87 degrees F    Family History  Problem Relation Age of Onset  . Heart disease    . Cancer    . Diabetes      History  Substance Use Topics  . Smoking status: Never Smoker   . Smokeless tobacco: Not on file  . Alcohol Use: No    OB History    Grav Para Term Preterm Abortions TAB SAB Ect Mult Living                  Review of Systems  Constitutional: Negative for fever.       10 Systems reviewed and are negative for acute change except as noted in the HPI.  HENT: Negative for congestion.   Eyes: Negative for discharge and redness.  Respiratory: Negative for cough and shortness of breath.   Cardiovascular: Negative for chest pain.  Gastrointestinal: Negative for vomiting and abdominal pain.    Genitourinary: Positive for flank pain.  Musculoskeletal: Negative for back pain.  Skin: Negative for rash.  Neurological: Negative for syncope, numbness and headaches.  Psychiatric/Behavioral:       No behavior change.    Allergies  Ciprofloxacin; Codeine; Morphine; Morphine and related; Penicillins; and Sulfonamide derivatives  Home Medications   Current Outpatient Rx  Name Route Sig Dispense Refill  . METFORMIN HCL 500 MG PO TABS Oral Take 500 mg by mouth 2 (two) times daily with a meal.    . PREDNISONE 10 MG PO KIT Oral Take 1 kit (10 mg total) by mouth as directed. 1 kit 0    12 day dose pak  . CYCLOBENZAPRINE HCL 10 MG PO TABS      . NAPROXEN 500 MG PO TABS        BP 165/83  Pulse 100  Temp(Src) 98.7 F (37.1 C) (Oral)  Resp 20  Ht 4\' 8"  (1.422 m)  Wt 178 lb (80.74 kg)  BMI 39.91 kg/m2  SpO2 100%  LMP 01/17/2012  Physical Exam  Nursing note and vitals reviewed. Constitutional:       Awake, alert, nontoxic appearance.  HENT:  Head: Atraumatic.  Eyes: Right eye exhibits no discharge. Left eye exhibits no discharge.  Neck: Neck  supple.  Pulmonary/Chest: Effort normal. She exhibits no tenderness.  Abdominal: Soft. There is no tenderness. There is no rebound.  Genitourinary:       Mild right cva tenderness to percussion  Musculoskeletal: She exhibits no tenderness.       Baseline ROM, no obvious new focal weakness.  Neurological:       Mental status and motor strength appears baseline for patient and situation.  Skin: No rash noted.  Psychiatric: She has a normal mood and affect.    ED Course  Procedures (including critical care time)  Results for orders placed during the hospital encounter of 05/07/12  URINALYSIS, ROUTINE W REFLEX MICROSCOPIC      Component Value Range   Color, Urine YELLOW  YELLOW    APPearance CLEAR  CLEAR    Specific Gravity, Urine 1.010  1.005 - 1.030    pH 6.0  5.0 - 8.0    Glucose, UA >1000 (*) NEGATIVE (mg/dL)   Hgb urine  dipstick SMALL (*) NEGATIVE    Bilirubin Urine NEGATIVE  NEGATIVE    Ketones, ur NEGATIVE  NEGATIVE (mg/dL)   Protein, ur NEGATIVE  NEGATIVE (mg/dL)   Urobilinogen, UA 0.2  0.0 - 1.0 (mg/dL)   Nitrite NEGATIVE  NEGATIVE    Leukocytes, UA NEGATIVE  NEGATIVE   URINE MICROSCOPIC-ADD ON      Component Value Range   Squamous Epithelial / LPF FEW (*) RARE    WBC, UA 0-2  <3 (WBC/hpf)   RBC / HPF 0-2  <3 (RBC/hpf)    Ct Abdomen Pelvis Wo Contrast  05/07/2012  *RADIOLOGY REPORT*  Clinical Data: Right flank pain and nausea.  CT ABDOMEN AND PELVIS WITHOUT CONTRAST  Technique:  Multidetector CT imaging of the abdomen and pelvis was performed following the standard protocol without intravenous contrast.  Comparison: 11/13/2011.  Findings: Lung bases show minimal dependent atelectasis. Heart size normal.  No pericardial or pleural effusion.  Liver is unremarkable.  Cholecystectomy.  Adrenal glands are normal.  There is severe right hydronephrosis secondary to two stones at the right ureteral pelvic junction, measuring 9 and 10 mm, respectively.  Right perinephric edema.  Right ureter is decompressed.  The overall appearance is slightly progressive from 11/13/2011.  Previously questioned right renal hilar mass is not well visualized.  Small stones are seen in the lower pole right kidney.  Small stone lower pole left kidney.  No hydronephrosis.  Spleen, pancreas, stomach and bowel are unremarkable.  Incidental note is made of a duodenal diverticulum.  Uterus and ovaries are visualized.  Small periumbilical hernia contains fat.  There is a surgical clip in the adjacent medial left rectus abdominus muscle.  Retroperitoneal lymph nodes appear slightly more prominent than on the prior exam, measuring up to 11 mm in the aortocaval station (image 46), previously 7 mm. No free fluid.  No worrisome lytic or sclerotic lesions.  Bilateral L5 pars defects without alignment abnormality.  IMPRESSION:  1.  Two stones at the right  ureteral pelvic junction with chronically progressive obstruction.  Previously questioned right renal hilar mass is poorly visualized in the absence of IV contrast. MR abdomen without and with contrast may be helpful in further evaluation, as clinically indicated. 2.  Retroperitoneal lymph nodes are borderline in size, but slightly enlarged from 11/13/2011. 3.  Bilateral nephrolithiasis.  Original Report Authenticated By: Reyes Ivan, M.D.    MDM  Patient with history of kidney stones which has passed on her own, here with right flank pain  that began at 1 AM. Pain radiates down her right side. Urine is unremarkable however the CT scan shows 2 stones at the right ureteropelvic junction 10 mm and 9 mm. There is severe right hydronephrosis due to the 2 stones. Patient received IV fluids, anti-inflammatory, antiemetic, analgesic x2 with some relief of pain. Awaiting lab results. Care/disposition  to Dr. Rubin Payor. Plan to call urology once labs have returned.  MDM Reviewed: nursing note and vitals Interpretation: labs and CT scan          Nicoletta Dress. Colon Branch, MD 05/07/12 [phone]

## 2012-05-07 NOTE — ED Notes (Signed)
Giving reoprt to RN on 300, states unable to take pt due to insulin drip. Sec put in another bed assignment for icu stepdown. No change in status.

## 2012-05-07 NOTE — ED Notes (Signed)
Right flank pain since 1 am with nausea

## 2012-05-07 NOTE — ED Notes (Signed)
Dr Felecia Shelling d/c iv insulin drip and vo given. Awaiting  Bed number to 300 again. Nad.

## 2012-05-07 NOTE — ED Notes (Signed)
CRITICAL VALUE ALERT Critical value received:  Glucose 565 Date of notification:  05/07/12 Time of notification: 8:57 AM  Critical value read back:  yes Nurse who received alert:  Abner Greenspan, RN MD notified (1st page):  Rubin Payor

## 2012-05-07 NOTE — Consult Note (Signed)
  [id number] 

## 2012-05-07 NOTE — Consult Note (Signed)
[phone]

## 2012-05-07 NOTE — ED Notes (Signed)
Pt aware awaiting all labs prior to consulting admission. Pain 8. Nad.

## 2012-05-07 NOTE — ED Provider Notes (Signed)
  Physical Exam  BP 135/75  Pulse 93  Temp(Src) 98.7 F (37.1 C) (Oral)  Resp 17  Ht 4\' 8"  (1.422 m)  Wt 178 lb (80.74 kg)  BMI 39.91 kg/m2  SpO2 96%  LMP 01/17/2012  Physical Exam  ED Course  Procedures  MDM CT shows large kidney stones with obstruction. She has a white count of 15. She has uncontrolled pain and hyperglycemia 600. Patient be admitted to medicine with consult to urology.      Juliet Rude. Rubin Payor, MD 05/07/12 [phone]

## 2012-05-08 ENCOUNTER — Encounter (HOSPITAL_COMMUNITY)
Admission: EM | Disposition: A | Discharge status: Home / Self Care | Payer: Self-pay | Source: Home / Self Care | Attending: Internal Medicine

## 2012-05-08 ENCOUNTER — Inpatient Hospital Stay (HOSPITAL_COMMUNITY): Payer: No Typology Code available for payment source

## 2012-05-08 ENCOUNTER — Encounter (HOSPITAL_COMMUNITY): Payer: Self-pay

## 2012-05-08 ENCOUNTER — Encounter (HOSPITAL_COMMUNITY): Payer: Self-pay | Admitting: Anesthesiology

## 2012-05-08 ENCOUNTER — Inpatient Hospital Stay (HOSPITAL_COMMUNITY): Payer: No Typology Code available for payment source | Admitting: Anesthesiology

## 2012-05-08 HISTORY — PX: CYSTOSCOPY W/ URETERAL STENT PLACEMENT: SHX1429

## 2012-05-08 LAB — HEMOGLOBIN A1C: Hgb A1c MFr Bld: 10.1 % — ABNORMAL HIGH (ref <5.7)

## 2012-05-08 LAB — GLUCOSE, CAPILLARY
Glucose-Capillary: 233 mg/dL — ABNORMAL HIGH (ref 70–99)
Glucose-Capillary: 188 mg/dL — ABNORMAL HIGH (ref 70–99)
Glucose-Capillary: 199 mg/dL — ABNORMAL HIGH (ref 70–99)

## 2012-05-08 SURGERY — CYSTOSCOPY, WITH RETROGRADE PYELOGRAM AND URETERAL STENT INSERTION
Anesthesia: General | Laterality: Right | Wound class: Clean Contaminated

## 2012-05-08 MED ORDER — FENTANYL CITRATE 0.05 MG/ML IJ SOLN
INTRAMUSCULAR | Status: AC
  Administered 2012-05-08: 50 ug via INTRAVENOUS
  Filled 2012-05-08: qty 2

## 2012-05-08 MED ORDER — STERILE WATER FOR IRRIGATION IR SOLN
Status: DC | PRN
  Administered 2012-05-08: 1000 mL

## 2012-05-08 MED ORDER — LACTATED RINGERS IV SOLN
INTRAVENOUS | Status: DC
  Administered 2012-05-08: 08:00:00 via INTRAVENOUS
  Administered 2012-05-09: 1000 mL via INTRAVENOUS

## 2012-05-08 MED ORDER — CEFAZOLIN SODIUM 1-5 GM-% IV SOLN
1.0000 g | INTRAVENOUS | Status: DC
  Filled 2012-05-08: qty 50

## 2012-05-08 MED ORDER — MIDAZOLAM HCL 2 MG/2ML IJ SOLN
1.0000 mg | INTRAMUSCULAR | Status: DC | PRN
Start: 2012-05-08 — End: 2012-05-08
  Administered 2012-05-08: 2 mg via INTRAVENOUS

## 2012-05-08 MED ORDER — FENTANYL CITRATE 0.05 MG/ML IJ SOLN
INTRAMUSCULAR | Status: DC | PRN
  Administered 2012-05-08: 25 ug via INTRAVENOUS

## 2012-05-08 MED ORDER — SODIUM CHLORIDE 0.9 % IR SOLN
Status: DC | PRN
  Administered 2012-05-08 (×2): 3000 mL

## 2012-05-08 MED ORDER — LIDOCAINE HCL (CARDIAC) 10 MG/ML IV SOLN
INTRAVENOUS | Status: DC | PRN
  Administered 2012-05-08: 10 mg via INTRAVENOUS

## 2012-05-08 MED ORDER — SODIUM CHLORIDE 0.9 % IJ SOLN
INTRAMUSCULAR | Status: AC
  Administered 2012-05-08: 10:00:00
  Filled 2012-05-08: qty 3

## 2012-05-08 MED ORDER — CEFAZOLIN SODIUM 1-5 GM-% IV SOLN
INTRAVENOUS | Status: AC
  Filled 2012-05-08: qty 50

## 2012-05-08 MED ORDER — MIDAZOLAM HCL 2 MG/2ML IJ SOLN
INTRAMUSCULAR | Status: AC
  Administered 2012-05-08: 2 mg via INTRAVENOUS
  Filled 2012-05-08: qty 2

## 2012-05-08 MED ORDER — FENTANYL CITRATE 0.05 MG/ML IJ SOLN
INTRAMUSCULAR | Status: AC
  Filled 2012-05-08: qty 2

## 2012-05-08 MED ORDER — PROPOFOL 10 MG/ML IV EMUL
INTRAVENOUS | Status: DC | PRN
  Administered 2012-05-08: 150 mg via INTRAVENOUS

## 2012-05-08 MED ORDER — LIDOCAINE HCL (PF) 1 % IJ SOLN
INTRAMUSCULAR | Status: AC
  Filled 2012-05-08: qty 5

## 2012-05-08 MED ORDER — PROPOFOL 10 MG/ML IV EMUL
INTRAVENOUS | Status: AC
  Filled 2012-05-08: qty 20

## 2012-05-08 MED ORDER — INSULIN GLARGINE 100 UNIT/ML ~~LOC~~ SOLN
40.0000 [IU] | Freq: Every day | SUBCUTANEOUS | Status: DC
  Administered 2012-05-08 – 2012-05-09 (×2): 40 [IU] via SUBCUTANEOUS

## 2012-05-08 MED ORDER — ONDANSETRON HCL 4 MG/2ML IJ SOLN: 4.0000 mg | Freq: Once | INTRAMUSCULAR | Status: AC | PRN

## 2012-05-08 MED ORDER — INSULIN ASPART 100 UNIT/ML ~~LOC~~ SOLN
15.0000 [IU] | Freq: Three times a day (TID) | SUBCUTANEOUS | Status: DC
  Administered 2012-05-08 – 2012-05-10 (×5): 15 [IU] via SUBCUTANEOUS

## 2012-05-08 MED ORDER — FENTANYL CITRATE 0.05 MG/ML IJ SOLN
25.0000 ug | INTRAMUSCULAR | Status: DC | PRN
Start: 2012-05-08 — End: 2012-05-10
  Administered 2012-05-08: 50 ug via INTRAVENOUS

## 2012-05-08 SURGICAL SUPPLY — 25 items
BAG DRAIN URO TABLE W/ADPT NS (DRAPE) ×2 IMPLANT
BAG DRN 8 ADPR NS SKTRN CSTL (DRAPE) ×1
BAG URINE DRAINAGE (UROLOGICAL SUPPLIES) ×1 IMPLANT
CATH 5 FR WEDGE TIP (UROLOGICAL SUPPLIES) ×2 IMPLANT
CATH FOLEY 2WAY SLVR  5CC 16FR (CATHETERS) ×1
CATH FOLEY 2WAY SLVR 5CC 16FR (CATHETERS) IMPLANT
CATH OPEN TIP 5FR (CATHETERS) ×1 IMPLANT
CLOTH BEACON ORANGE TIMEOUT ST (SAFETY) ×2 IMPLANT
DILATOR UROMAX ULTRA (MISCELLANEOUS) IMPLANT
GLOVE BIO SURGEON STRL SZ7 (GLOVE) ×2 IMPLANT
GLOVE ECLIPSE 7.0 STRL STRAW (GLOVE) ×1 IMPLANT
GLOVE EXAM NITRILE MD LF STRL (GLOVE) ×1 IMPLANT
GLOVE INDICATOR 7.5 STRL GRN (GLOVE) ×1 IMPLANT
GOWN STRL REIN XL XLG (GOWN DISPOSABLE) ×2 IMPLANT
IV NS IRRIG 3000ML ARTHROMATIC (IV SOLUTION) ×4 IMPLANT
KIT ROOM TURNOVER AP CYSTO (KITS) ×2 IMPLANT
MANIFOLD NEPTUNE II (INSTRUMENTS) ×2 IMPLANT
PACK CYSTO (CUSTOM PROCEDURE TRAY) ×2 IMPLANT
PAD ARMBOARD 7.5X6 YLW CONV (MISCELLANEOUS) ×2 IMPLANT
STENT PERCUFLEX 4.8FRX24 (STENTS) IMPLANT
STENT URET 6FRX24 CONTOUR (STENTS) ×1 IMPLANT
STONE RETRIEVAL GEMINI 2.4 FR (MISCELLANEOUS) IMPLANT
SYRINGE 10CC LL (SYRINGE) ×1 IMPLANT
TOWEL OR 17X26 4PK STRL BLUE (TOWEL DISPOSABLE) ×1 IMPLANT
WIRE GUIDE BENTSON .035 15CM (WIRE) ×2 IMPLANT

## 2012-05-08 NOTE — Progress Notes (Signed)
Patient ID: [name], female   DOB: 1966-09-08, 46 y.o.   MRN: [id number]

## 2012-05-08 NOTE — Op Note (Signed)
[name], [name] NO.:  [id number]  MEDICAL RECORD NO.:  [id number]  LOCATION:  A310                          FACILITY:  [facility]  PHYSICIAN:  Ky Barban, M.D.DATE OF BIRTH:  08-Jan-1966  DATE OF PROCEDURE: DATE OF DISCHARGE:                              OPERATIVE REPORT   PREOPERATIVE DIAGNOSES:  Right renal calculus, right pyelonephritis.  POSTOPERATIVE DIAGNOSIS:  Right pyonephrosis with obstruction from right renal calculus at the ureteropelvic junction.  PROCEDURE:  Cystoscopy, insertion of double-J stent, size 5-French 24 cm without string and retrograde pyelogram.  ANESTHESIA:  General.  PROCEDURE:  The patient given general endotracheal anesthesia in lithotomy position.  After usual prep and drape, #25 cystoscope introduced into the bladder.  It is inspected, looks normal.  Right ureteral orifice was catheterized with an open-end catheter which was advanced into the right ureter.  Then a guidewire was passed up into the upper ureter through the open-end catheter.  It curls up near the UPJ. I advanced the open-end catheter to the level of the UPJ.  Even then the guidewire has some difficulty getting in but eventually the guidewire did go inside the renal pelvis.  I advanced the open-end catheter into the renal pelvis.  The guidewire was removed.  Thick urine pyonephrosis is obtained.  There is hydronephrotic drip.  After extracting about 5 mL of this purulent urine, I injected about couple of mL of Hypaque to outline the renal pelvis.  We dilated the collecting system, partially is outlined.  I did not want to inject any more dye.  The catheter is reintroduced over the guidewire and the guidewire was removed.  Some more pyonephrosis was drained.  The guidewire was reintroduced and the open-end catheter is removed.  Over the open-end catheter, I introduced a 6-French 24 cm double-J stent.  It was advanced into the renal pelvis. A nice loop was  obtained in the renal pelvis just above the UPJ and during this maneuvering, the stone has been pushed away from the UPJ. After removing the guidewire, nice loop was obtained in the bladder and I can see pus coming out around the stent.  So I decided to leave a Foley catheter in.  I inserted a #16 Foley catheter.  Bimanual pelvic exam is done which is unremarkable.  All the instruments were removed.  The patient left the operating room in satisfactory condition.     Ky Barban, M.D.     MIJ/MEDQ  D:  05/08/2012  T:  05/08/2012  Job:  [id number]  cc:   Tesfaye D. Felecia Shelling, MD Fax: [phone]

## 2012-05-08 NOTE — Addendum Note (Signed)
Addendum  created 05/08/12 0902 by Franco Nones, CRNA   Modules edited:Anesthesia Events, Orders

## 2012-05-08 NOTE — Consult Note (Signed)
NAMEEH, SAUSEDA NO.:  [id number]  MEDICAL RECORD NO.:  [id number]  LOCATION:  A310                          FACILITY:  [facility]  PHYSICIAN:  Ky Barban, M.D.DATE OF BIRTH:  Jun 02, 1966  DATE OF CONSULTATION: DATE OF DISCHARGE:                                CONSULTATION   CHIEF COMPLAINT: Right renal colic.  HISTORY:  A 47 year old female who I have seen about 4 years ago with left renal colic.  At that time, she underwent ESL.  Now today about 1 o'clock, she started to have severe pain in the right flank associated with nausea.  No vomiting, no fever, chills, or any voiding difficulty. No hematuria.  Came to the emergency room.  CT scan shows 2 cm stone in the right renal pelvis causing severe hydronephrosis.  No stone in the ureter.  She is admitted for management and control of further pain. She had previous cholecystectomy.  Adrenal glands are normal.  There is right hydronephrosis secondary to 2 stones at the right ureteropelvic junction measuring 9 mm and 10 mm respectively.  Right perinephric edema.  Right ureter is decompressed.  The overall appearance is slightly progressive from previous x-rays of December 2012.  Small stones are seen in the lower pole in the right kidney and also in the left kidney.  No hydronephrosis on the left side.  Couple of weeks ago, she had ablation of the uterus and she says that she is doing fine from that operation.  Retroperitoneal lymph nodes appear slightly more prominent than on the prior examination measuring 11 mm in the aortocaval station.  No free fluid.  No worrisome lytic or sclerotic lesions.  She has a stone with severe hydronephrosis and pain.  PAST MEDICAL HISTORY:  ESL on the left side, uterine ablation 2 weeks ago for bleeding, and history of cholecystectomy.  She does have hypertension.  She has also type 2 diabetes which is not controlled. She has been evaluated by Dr. Purcell Nails and  started her on insulin.  She is going to manage her diabetes.  IMPRESSION:  Right renal calculus with severe hydronephrosis.  We will put a double-J stent in the morning.  I told her that she should be able to go home tomorrow but I just found out that she is diabetic and it will be up to Dr. Felecia Shelling to make that decision when she can go home but I will put a double-J stent in the morning under anesthesia.     Ky Barban, M.D.    MIJ/MEDQ  D:  05/07/2012  T:  05/07/2012  Job:  [id number]

## 2012-05-08 NOTE — Progress Notes (Signed)
UR chart review completed.  

## 2012-05-08 NOTE — Progress Notes (Signed)
[name], [name] NO.:  [id number]  MEDICAL RECORD NO.:  [id number]  LOCATION:  A310                          FACILITY:  [facility]  PHYSICIAN:  Purcell Nails, MD DATE OF BIRTH:  June 29, 1966  DATE OF PROCEDURE:  05/08/2012 DATE OF DISCHARGE:                                PROGRESS NOTE   CHIEF COMPLAINT:  Followup for type 2 diabetes.  SUBJECTIVE:  The patient had hyperglycemia, which required basal bolus insulin, had glycemia 200-250 range.  No hypoglycemia.  OBJECTIVE:  GENERAL:  The patient is not in acute distress.  Going to OR today. VITAL SIGNS:  Blood pressure is 153/80, pulse rate 103, temperature 98.2. CHEST:  Clear to auscultation bilaterally. CARDIOVASCULAR:  Normal S1, S2.  No murmur.  No gallop. ABDOMEN:  Soft and nontender. EXTREMITIES:  No edema.  LABS:  Glycemia ranged from 200-250 average.  ASSESSMENT: 1. Type 2 diabetes, uncontrolled.  Her A1c is 10.1%. 2. Bilateral nephrolithiasis. 3. Abdominal pain from bilateral nephrolithiasis. 4. Hyperlipidemia. 5. Hypertension.  PLAN:  We will increase Lantus to 40 units nightly and NovoLog to 15 units t.i.d. a.c. plus correction dose.  I will continue to follow her to titrate insulin based on blood glucose readings.          ______________________________ Purcell Nails, MD     GN/MEDQ  D:  05/08/2012  T:  05/08/2012  Job:  [id number]

## 2012-05-08 NOTE — Transfer of Care (Signed)
Immediate Anesthesia Transfer of Care Note  Patient: [name]  Procedure(s) Performed: Procedure(s) (LRB): CYSTOSCOPY WITH STENT PLACEMENT (Right) CYSTOSCOPY WITH RETROGRADE PYELOGRAM/URETERAL STENT PLACEMENT (Right)  Patient Location: PACU  Anesthesia Type: General  Level of Consciousness: awake  Airway & Oxygen Therapy: Patient Spontanous Breathing and non-rebreather face mask  Post-op Assessment: Report given to PACU RN, Post -op Vital signs reviewed and stable and Patient moving all extremities  Post vital signs: Reviewed and stable  Complications: No apparent anesthesia complications

## 2012-05-08 NOTE — Progress Notes (Signed)
[name], [name]                 ACCOUNT NO.:  [id number]  MEDICAL RECORD NO.:  [id number]  LOCATION:  A310                          FACILITY:  [facility]  PHYSICIAN:  Addilee Neu D. Felecia Shelling, MD   DATE OF BIRTH:  02-09-1966  DATE OF PROCEDURE:  05/08/2012 DATE OF DISCHARGE:                                PROGRESS NOTE   SUBJECTIVE:  The patient feels better.  Had a cystoscopy and a stent placement.  No new complaints.  OBJECTIVE:  GENERAL:  The patient is alert, awake, sick looking. VITAL SIGNS:  Blood pressure 110/75, pulse 89, respiratory rate 17, and temperature 98 degrees Fahrenheit. CHEST:  Decreased air entry, few rhonchi. CARDIOVASCULAR SYSTEM:  First and second heart sounds heard.  No murmur. No gallop. ABDOMEN:  Soft and lax.  Bowel sound is positive.  No mass or organomegaly. EXTREMITIES:  No leg edema.  LABORATORY DATA:  Blood glucose is ranging around 250.  ASSESSMENT: 1. Renal calculi, status post stent placement. 2. Hydronephrosis secondary to the above. 3. Diabetes mellitus, poorly controlled. 4. Anemia. 5. Obesity.  PLAN:  We will continue the patient on IV antibiotics.  Continue pain management.  Continue IV fluid and current treatment.     [name] D. Felecia Shelling, MD     TDF/MEDQ  D:  05/08/2012  T:  05/08/2012  Job:  [id number]

## 2012-05-08 NOTE — Progress Notes (Signed)
Note patient with A1c=10.1%.  Currently on Lantus/Novolog regimen in the hospital.  If patient is to be discharged home on insulin will need insulin administration education.  Discussed with RN.  Will follow.

## 2012-05-08 NOTE — Anesthesia Postprocedure Evaluation (Signed)
Anesthesia Post Note  Patient: [name]  Procedure(s) Performed: Procedure(s) (LRB): CYSTOSCOPY WITH STENT PLACEMENT (Right) CYSTOSCOPY WITH RETROGRADE PYELOGRAM/URETERAL STENT PLACEMENT (Right)  Anesthesia type: General  Patient location: PACU  Post pain: Pain level controlled  Post assessment: Post-op Vital signs reviewed, Patient's Cardiovascular Status Stable, Respiratory Function Stable, Patent Airway, No signs of Nausea or vomiting and Pain level controlled  Last Vitals:  Filed Vitals:   05/08/12 0844  BP: 153/73  Pulse: 110  Temp: 37.3 C  Resp: 12    Post vital signs: Reviewed and stable  Level of consciousness: awake and alert   Complications: No apparent anesthesia complications

## 2012-05-08 NOTE — Anesthesia Preprocedure Evaluation (Addendum)
Anesthesia Evaluation  Patient identified by MRN, date of birth, ID band Patient awake    Reviewed: Allergy & Precautions, H&P , NPO status , Patient's Chart, lab work & pertinent test results  Airway Mallampati: II TM Distance: >3 FB Neck ROM: Full    Dental No notable dental hx.    Pulmonary    Pulmonary exam normal       Cardiovascular negative cardio ROS  Rhythm:Regular Rate:Normal     Neuro/Psych PSYCHIATRIC DISORDERS Depression  Neuromuscular disease    GI/Hepatic negative GI ROS, Neg liver ROS,   Endo/Other  Diabetes mellitus-, Poorly Controlled, Type 2, Insulin Dependent  Renal/GU negative Renal ROS     Musculoskeletal negative musculoskeletal ROS (+)   Abdominal (+) + obese,  Abdomen: soft.    Peds  Hematology negative hematology ROS (+)   Anesthesia Other Findings   Reproductive/Obstetrics negative OB ROS                          Anesthesia Physical Anesthesia Plan  ASA: II  Anesthesia Plan: General   Post-op Pain Management:    Induction: Intravenous  Airway Management Planned: LMA  Additional Equipment:   Intra-op Plan:   Post-operative Plan: Extubation in OR  Informed Consent: I have reviewed the patients History and Physical, chart, labs and discussed the procedure including the risks, benefits and alternatives for the proposed anesthesia with the patient or authorized representative who has indicated his/her understanding and acceptance.     Plan Discussed with:   Anesthesia Plan Comments:         Anesthesia Quick Evaluation

## 2012-05-08 NOTE — Addendum Note (Signed)
Addendum  created 05/08/12 0902 by Hinley Brimage S Abbigail Anstey, CRNA   Modules edited:Anesthesia Events, Orders    

## 2012-05-08 NOTE — Anesthesia Procedure Notes (Signed)
Procedure Name: LMA Insertion Date/Time: 05/08/2012 7:59 AM Performed by: Franco Nones Pre-anesthesia Checklist: Patient identified, Patient being monitored, Emergency Drugs available, Timeout performed and Suction available Patient Re-evaluated:Patient Re-evaluated prior to inductionOxygen Delivery Method: Circle System Utilized Preoxygenation: Pre-oxygenation with 100% oxygen Intubation Type: IV induction Ventilation: Mask ventilation without difficulty LMA: LMA inserted LMA Size: 4.0 Number of attempts: 1 Placement Confirmation: positive ETCO2 and breath sounds checked- equal and bilateral Tube secured with: Tape

## 2012-05-08 NOTE — H&P (Signed)
[name], [name]                 ACCOUNT NO.:  [id number]  MEDICAL RECORD NO.:  [id number]  LOCATION:  A310                          FACILITY:  [facility]  PHYSICIAN:  Rollande Thursby D. Felecia Shelling, MD   DATE OF BIRTH:  1966-06-10  DATE OF ADMISSION:  05/07/2012 DATE OF DISCHARGE:  LH                             HISTORY & PHYSICAL   CHIEF COMPLAINT:  Flank pain.  HISTORY OF PRESENT ILLNESS:  This is a 46 year old female patient with history of multiple medical illnesses, came to the emergency room with severe pain in her right flank area.  She had associated nausea, but no vomiting.  The pain was radiating to her groin area.  CT scan was done which showed right urethral calculi with obstruction.  Urology consult was done and advised to admit the patient and treat her medical problem so that she will be ready for cystoscopy and stent placement.  The patient was found to have a blood sugar above 500.  She was treated with insulin in emergency room, and it slightly improved.  The patient was admitted for further treatment.  REVIEW OF SYSTEMS:  The patient complains of flank pain and nausea.  No fever, chills, cough, chest pain, shortness of breath, dysuria, urgency or frequency of urination.  PAST MEDICAL HISTORY: 1. Diabetes mellitus. 2. Hypertension. 3. Hypercholesterolemia. 4. Obesity. 5. Status post cholecystectomy.  CURRENT MEDICATIONS: 1. Metformin 500 mg p.o. b.i.d. 2. Cyclobenzaprine 10 mg daily. 3. Naproxen 500 mg q.6 h. p.r.n.  SOCIAL HISTORY:  The patient has no history of alcohol, tobacco, or substance abuse.  FAMILY HISTORY:  She has a history of diabetes mellitus in the family, cancer, and coronary artery disease.  PHYSICAL EXAMINATION:  GENERAL:  The patient is alert, awake, and sick looking. VITAL SIGNS:  Blood pressure 110/75, pulse 89, respiratory rate 17, temperature 98.6 degrees Fahrenheit. HEENT:  Pupils are equal, reactive. NECK:  Supple. CHEST:  Decreased air  entry, bilateral rhonchi. CARDIOVASCULAR:  First and second heart sounds heard.  No murmur or gallop. ABDOMEN:  Soft and lax.  Bowel sounds are positive.  No mass or organomegaly.  EXTREMITIES:  No leg edema.  LABORATORY DATA:  Labs on admission, CBC, WBC 15.1, hemoglobin 12.4, hematocrit 37.4, and platelets 399.  BMP, sodium 128, potassium 3.4, chloride 91, carbon dioxide 21, blood sugar 565, BUN 19, creatinine 1.2, calcium 8.6.  ASSESSMENT: 1. Right flank pain secondary to renal calculi. 2. Hydronephrosis secondary to the above. 3. Diabetes mellitus, poorly controlled. 4. Mild anemia.  PLAN:  We will continue the patient on pain management.  Urology consult is appreciated.  We will continue insulin therapy.  We will continue to monitor her CBC and BMP.     [name] D. Felecia Shelling, MD     TDF/MEDQ  D:  05/08/2012  T:  05/08/2012  Job:  [id number]

## 2012-05-08 NOTE — Brief Op Note (Signed)
05/07/2012 - 05/08/2012  8:27 AM  PATIENT:  [name]  46 y.o. female  PRE-OPERATIVE DIAGNOSIS:  r renal calculus  POST-OPERATIVE DIAGNOSIS:  * No post-op diagnosis entered *  PROCEDURE:  Procedure(s) (LRB): CYSTOSCOPY WITH STENT PLACEMENT (Right)  SURGEON:  Surgeon(s) and Role:    * Ky Barban, MD - Primary  PHYSICIAN ASSISTANT:   ASSISTANTS: none   ANESTHESIA:   general  EBL:     BLOOD ADMINISTERED:none  DRAINS: Urinary Catheter (Foley)   LOCAL MEDICATIONS USED:  NONE  SPECIMEN:  Source of Specimen:  urine from r renal pelvis for c and s  DISPOSITION OF SPECIMEN:  N/A  COUNTS:  YES  TOURNIQUET:  * No tourniquets in log *  DICTATION: .Other Dictation: Dictation Number dictation [id number]  PLAN OF CARE: Admit to inpatient   PATIENT DISPOSITION:  PACU - hemodynamically stable.   Delay start of Pharmacological VTE agent (>24hrs) due to surgical blood loss or risk of bleeding:

## 2012-05-09 LAB — GLUCOSE, CAPILLARY
Glucose-Capillary: 158 mg/dL — ABNORMAL HIGH (ref 70–99)
Glucose-Capillary: 241 mg/dL — ABNORMAL HIGH (ref 70–99)
Glucose-Capillary: 138 mg/dL — ABNORMAL HIGH (ref 70–99)

## 2012-05-09 LAB — CBC
Hemoglobin: 12.1 g/dL (ref 12.0–15.0)
MCH: 26 pg (ref 26.0–34.0)
MCHC: 32.4 g/dL (ref 30.0–36.0)
MCV: 80 fL (ref 78.0–100.0)
Platelets: 390 10*3/uL (ref 150–400)

## 2012-05-09 LAB — BASIC METABOLIC PANEL
Calcium: 9 mg/dL (ref 8.4–10.5)
Creatinine, Ser: 0.81 mg/dL (ref 0.50–1.10)
GFR calc non Af Amer: 86 mL/min — ABNORMAL LOW (ref >90)
Glucose, Bld: 174 mg/dL — ABNORMAL HIGH (ref 70–99)
Sodium: 135 mEq/L (ref 135–145)

## 2012-05-09 MED ORDER — POTASSIUM CHLORIDE CRYS ER 20 MEQ PO TBCR
20.0000 meq | EXTENDED_RELEASE_TABLET | Freq: Once | ORAL | Status: AC
  Administered 2012-05-09: 20 meq via ORAL
  Filled 2012-05-09: qty 1

## 2012-05-09 MED ORDER — LEVOFLOXACIN IN D5W 500 MG/100ML IV SOLN
500.0000 mg | INTRAVENOUS | Status: DC
  Administered 2012-05-09: 500 mg via INTRAVENOUS
  Filled 2012-05-09 (×4): qty 100

## 2012-05-09 MED ORDER — BD GETTING STARTED TAKE HOME KIT: 1/2ML X 30G SYRINGES
1.0000 | Freq: Once | Status: AC
  Administered 2012-05-09: 1
  Filled 2012-05-09: qty 1

## 2012-05-09 NOTE — Progress Notes (Signed)
Patient ID: [name], female   DOB: 03/07/1966, 46 y.o.   MRN: [id number]

## 2012-05-09 NOTE — Progress Notes (Signed)
[name], [name]                 ACCOUNT NO.:  [id number]  MEDICAL RECORD NO.:  [id number]  LOCATION:  A310                          FACILITY:  [facility]  PHYSICIAN:  Jhene Westmoreland D. Felecia Shelling, MD   DATE OF BIRTH:  [birth year]  DATE OF PROCEDURE:  05/09/2012 DATE OF DISCHARGE:                                PROGRESS NOTE   SUBJECTIVE:  The patient feels slightly better.  She continues to have pain in the right flank intermittently.  No fever or chills.  She is growing Staph in her urine.  OBJECTIVE:  GENERAL:  The patient is alert, awake, and sick looking. VITAL SIGNS:  Blood pressure 109/75, pulse 89, respiratory rate 14, temperature 98 degrees Fahrenheit. CHEST:  Clear lung fields.  Good air entry. CARDIOVASCULAR:  First and second heart sounds heard.  No murmur.  No gallop. ABDOMEN:  Soft and lax.  Bowel sound is positive.  No mass or organomegaly. EXTREMITIES:  No leg edema.  LABORATORY DATA:  BMP:  Sodium 135, potassium 3.4, chloride 96, carbon dioxide 29, glucose 114, BUN 13, creatinine 0.81.  ASSESSMENT: 1. Hydronephrosis secondary to renal calculi. 2. Status post stent placement. 3. Urinary tract infection. 4. Hypokalemia. 5. Diabetes mellitus.  PLAN:  We will continue the patient on IV antibiotics.  Levaquin has been added according to the sensitivity pattern.  We will continue adjusting her insulin.  We will continue according to Urology recommendation.     Nicoletta Hush D. Felecia Shelling, MD     TDF/MEDQ  D:  05/09/2012  T:  05/09/2012  Job:  [id number]

## 2012-05-09 NOTE — Anesthesia Postprocedure Evaluation (Signed)
  Anesthesia Post-op Note  Patient: [name]  Procedure(s) Performed: Procedure(s) (LRB): CYSTOSCOPY WITH RETROGRADE PYELOGRAM/URETERAL STENT PLACEMENT (Right)  Patient Location: room 310  Anesthesia Type: General  Level of Consciousness: awake, alert , oriented and patient cooperative  Airway and Oxygen Therapy: Patient Spontanous Breathing and Patient connected to nasal cannula oxygen  Post-op Pain: 3 /10, mild  Post-op Assessment: Post-op Vital signs reviewed, Patient's Cardiovascular Status Stable, PATIENT'S CARDIOVASCULAR STATUS UNSTABLE, Patent Airway, No signs of Nausea or vomiting and Pain level controlled  Post-op Vital Signs: Reviewed and stable  Complications: No apparent anesthesia complications

## 2012-05-09 NOTE — Progress Notes (Signed)
Pt complaints of foley leaking. Dr. Jerre Simon notified. Instructed that it was "OK" but could take out foley if patient wished. Patient agreeable. Up to void shortly after discontinuing. Patient thanked staff.

## 2012-05-09 NOTE — Addendum Note (Signed)
Addendum  created 05/09/12 1452 by Manu Rubey J Laruen Risser, CRNA   Modules edited:Notes Section    

## 2012-05-09 NOTE — Progress Notes (Signed)
NAMEHISAE, DECOURSEY NO.:  [id number]  MEDICAL RECORD NO.:  [id number]  LOCATION:  A310                          FACILITY:  [facility]  PHYSICIAN:  Purcell Nails, MD DATE OF BIRTH:  1966/07/30  DATE OF PROCEDURE:  05/09/2012 DATE OF DISCHARGE:                                PROGRESS NOTE   REASON FOR FOLLOWUP:  Uncontrolled type 2 diabetes.  SUBJECTIVE:  The patient is status post cystoscopy and stent placement. No new complaints.  Blood glycemia control is achieved using basal bolus insulin.  OBJECTIVE:  GENERAL:  The patient is not in acute distress. VITAL SIGNS:  Blood pressure is 126/79, pulse rate 99, temperature 99.1. HEENT:  No pallor.  No icterus. NECK:  Negative for JVD or thyromegaly. CHEST:  Clear to auscultation bilaterally. CARDIOVASCULAR:  Normal S1 and S2.  No murmur.  No gallop. ABDOMEN:  Slightly tender on bilateral flanks. EXTREMITIES:  No edema.  Glycemia ranges from 100-150 on average.  No hypoglycemia.  Her A1c is 10.1% consistent with chronically uncontrolled status.  ASSESSMENT: 1. Type 2 diabetes, uncontrolled. 2. Renal calculi status post stent placement. 3. Hydronephrosis secondary to above. 4. Anemia. 5. Obesity.  PLAN:  We will continue on current insulin regimen of glargine 40 units at bedtime and NovoLog 15 units with meals plus correction dose.  Avoid use of metformin in hospital.  She will be discharged on the same insulin regimen and we will see her in clinic in 2 weeks for possible transition to her oral regimen.         ______________________________ Purcell Nails, MD    GN/MEDQ  D:  05/09/2012  T:  05/09/2012  Job:  [id number]

## 2012-05-09 NOTE — Care Management Note (Unsigned)
    Page 1 of 1   05/09/2012     2:46:24 PM   CARE MANAGEMENT NOTE 05/09/2012  Patient:  [name], [name]   Account Number:  [id number]  Date Initiated:  05/09/2012  Documentation initiated by:  Sharrie Rothman  Subjective/Objective Assessment:   Pt admitted from home with kidney stones and hydronephritis. Pt lives with spouse. Pt will return home at discharge.     Action/Plan:   No CM or HH needs noted.   Anticipated DC Date:  05/14/2012   Anticipated DC Plan:  HOME/SELF CARE      DC Planning Services  CM consult      Choice offered to / List presented to:             Status of service:  Completed, signed off Medicare Important Message given?   (If response is "NO", the following Medicare IM given date fields will be blank) Date Medicare IM given:   Date Additional Medicare IM given:    Discharge Disposition:  HOME/SELF CARE  Per UR Regulation:    If discussed at Long Length of Stay Meetings, dates discussed:    Comments:  05/09/12 1445 Arlyss Queen, RN BSN CM

## 2012-05-10 DIAGNOSIS — N390 Urinary tract infection, site not specified: Secondary | ICD-10-CM | POA: Diagnosis present

## 2012-05-10 DIAGNOSIS — I10 Essential (primary) hypertension: Secondary | ICD-10-CM | POA: Diagnosis present

## 2012-05-10 DIAGNOSIS — IMO0002 Reserved for concepts with insufficient information to code with codable children: Secondary | ICD-10-CM | POA: Diagnosis present

## 2012-05-10 DIAGNOSIS — E1165 Type 2 diabetes mellitus with hyperglycemia: Secondary | ICD-10-CM | POA: Diagnosis present

## 2012-05-10 LAB — GLUCOSE, CAPILLARY: Glucose-Capillary: 145 mg/dL — ABNORMAL HIGH (ref 70–99)

## 2012-05-10 MED ORDER — OXYCODONE-ACETAMINOPHEN 5-325 MG PO TABS: 1.0000 | ORAL_TABLET | ORAL | Status: DC | PRN

## 2012-05-10 MED ORDER — LEVOFLOXACIN 250 MG PO TABS: 250.0000 mg | ORAL_TABLET | Freq: Every day | ORAL | Status: AC

## 2012-05-10 MED ORDER — ACETAMINOPHEN 325 MG PO TABS
ORAL_TABLET | ORAL | Status: AC
  Filled 2012-05-10: qty 2

## 2012-05-10 MED ORDER — OXYCODONE-ACETAMINOPHEN 5-325 MG PO TABS: 1.0000 | ORAL_TABLET | ORAL | Status: AC | PRN

## 2012-05-10 MED ORDER — INSULIN GLARGINE 100 UNIT/ML ~~LOC~~ SOLN: 40.0000 [IU] | Freq: Every day | SUBCUTANEOUS | Status: DC

## 2012-05-10 MED ORDER — ACETAMINOPHEN 325 MG PO TABS
650.0000 mg | ORAL_TABLET | ORAL | Status: DC | PRN
  Administered 2012-05-10: 650 mg via ORAL

## 2012-05-10 MED ORDER — INSULIN ASPART 100 UNIT/ML ~~LOC~~ SOLN: 15.0000 [IU] | Freq: Three times a day (TID) | SUBCUTANEOUS | Status: DC

## 2012-05-10 NOTE — Progress Notes (Signed)
Discharge instructions reviewed with patient, patient voiced understanding. Patient given discharge instructions and prescription. Called [facility] and verified prescription were there and ready for pick up, notified patient. Patient in stable condition and transported out by tech.

## 2012-05-10 NOTE — Discharge Summary (Signed)
Physician Discharge Summary  Patient ID: [name] MRN: [id number] DOB/AGE: Nov 23, 1966 46 y.o. Primary Care Physician:FANTA,TESFAYE, MD, MD Admit date: 05/07/2012 Discharge date: 05/10/2012    Discharge Diagnoses:   Principal Problem:  *NEPHROLITHIASIS Active Problems:  HYPERLIPIDEMIA  Diabetes mellitus type II, uncontrolled  Hypertension   Medication List  As of 05/10/2012 10:23 AM   STOP taking these medications         cyclobenzaprine 10 MG tablet      metFORMIN 500 MG tablet      naproxen 500 MG tablet         TAKE these medications         insulin aspart 100 UNIT/ML injection   Commonly known as: novoLOG   Inject 15 Units into the skin 3 (three) times daily with meals.      insulin glargine 100 UNIT/ML injection   Commonly known as: LANTUS   Inject 40 Units into the skin at bedtime.      levofloxacin 250 MG tablet   Commonly known as: LEVAQUIN   Take 1 tablet (250 mg total) by mouth daily.      levofloxacin 250 MG tablet   Commonly known as: LEVAQUIN   Take 1 tablet (250 mg total) by mouth daily.      oxyCODONE-acetaminophen 5-325 MG per tablet   Commonly known as: PERCOCET   Take 1 tablet by mouth every 4 (four) hours as needed for pain.      oxyCODONE-acetaminophen 5-325 MG per tablet   Commonly known as: PERCOCET   Take 1 tablet by mouth every 4 (four) hours as needed.            Discharged Condition: Improved    Consults: Urology/endocrinology  Significant Diagnostic Studies: Ct Abdomen Pelvis Wo Contrast  05/07/2012  *RADIOLOGY REPORT*  Clinical Data: Right flank pain and nausea.  CT ABDOMEN AND PELVIS WITHOUT CONTRAST  Technique:  Multidetector CT imaging of the abdomen and pelvis was performed following the standard protocol without intravenous contrast.  Comparison: 11/13/2011.  Findings: Lung bases show minimal dependent atelectasis. Heart size normal.  No pericardial or pleural effusion.  Liver is unremarkable.  Cholecystectomy.   Adrenal glands are normal.  There is severe right hydronephrosis secondary to two stones at the right ureteral pelvic junction, measuring 9 and 10 mm, respectively.  Right perinephric edema.  Right ureter is decompressed.  The overall appearance is slightly progressive from 11/13/2011.  Previously questioned right renal hilar mass is not well visualized.  Small stones are seen in the lower pole right kidney.  Small stone lower pole left kidney.  No hydronephrosis.  Spleen, pancreas, stomach and bowel are unremarkable.  Incidental note is made of a duodenal diverticulum.  Uterus and ovaries are visualized.  Small periumbilical hernia contains fat.  There is a surgical clip in the adjacent medial left rectus abdominus muscle.  Retroperitoneal lymph nodes appear slightly more prominent than on the prior exam, measuring up to 11 mm in the aortocaval station (image 46), previously 7 mm. No free fluid.  No worrisome lytic or sclerotic lesions.  Bilateral L5 pars defects without alignment abnormality.  IMPRESSION:  1.  Two stones at the right ureteral pelvic junction with chronically progressive obstruction.  Previously questioned right renal hilar mass is poorly visualized in the absence of IV contrast. MR abdomen without and with contrast may be helpful in further evaluation, as clinically indicated. 2.  Retroperitoneal lymph nodes are borderline in size, but slightly enlarged from 11/13/2011. 3.  Bilateral nephrolithiasis.  Original Report Authenticated By: Reyes Ivan, M.D.   Dg Shoulder Right  04/16/2012  *RADIOLOGY REPORT*  Clinical Data: Right shoulder pain radiating to the neck, no injury  RIGHT SHOULDER - 2+ VIEW  Comparison: None.  Findings: The right humeral head is in normal position.  The right [facility] joint is normally aligned.  No bony abnormality is seen.  IMPRESSION: Negative right shoulder.  Original Report Authenticated By: Juline Patch, M.D.   Dg Retrograde Pyelogram  05/08/2012  *RADIOLOGY  REPORT*  Clinical Data: Right ureteral calculus.  RETROGRADE PYELOGRAM  Comparison: CT 05/07/2012  Findings: Multiple images from a right retrograde pyelogram. Images demonstrate dilated right renal pelvis and collecting system, particularly in the upper pole.  Final image demonstrates placement of a right ureteral stent.  The mid and distal ureteral stent are not visualized.  IMPRESSION: Intraoperative imaging as above.  Original Report Authenticated By: Cyndie Chime, M.D.   US Venous Img Lower Unilateral Right  04/28/2012  *RADIOLOGY REPORT*  Clinical Data: Right leg pain  RIGHT LOWER EXTREMITY VENOUS DUPLEX ULTRASOUND  Technique:  Gray-scale sonography with graded compression, as well as color Doppler and duplex ultrasound, were performed to evaluate the deep venous system of the lower extremity from the level of the common femoral vein through the popliteal and proximal calf veins. Spectral Doppler was utilized to evaluate flow at rest and with distal augmentation maneuvers.  Comparison:  None.  Findings: There is complete compressibility of the right common femoral, femoral, and popliteal veins.  Doppler analysis demonstrates respiratory phasicity and augmentation of flow upon calf compression.  IMPRESSION: No evidence of right lower extremity DVT.  Original Report Authenticated By: Donavan Burnet, M.D.    Lab Results: Basic Metabolic Panel:  Basename 05/09/12 0520  NA 135  K 3.4*  CL 96  CO2 29  GLUCOSE 174*  BUN 13  CREATININE 0.81  CALCIUM 9.0  MG --  PHOS --   Liver Function Tests: No results found for this basename: AST:2,ALT:2,ALKPHOS:2,BILITOT:2,PROT:2,ALBUMIN:2 in the last 72 hours   CBC:  Basename 05/09/12 0520  WBC 10.5  NEUTROABS --  HGB 12.1  HCT 37.3  MCV 80.0  PLT 390    Recent Results (from the past 240 hour(s))  SURGICAL PCR SCREEN     Status: Normal   Collection Time   05/07/12  5:04 PM      Component Value Range Status Comment   MRSA, PCR NEGATIVE   NEGATIVE  Final    Staphylococcus aureus NEGATIVE  NEGATIVE  Final   URINE CULTURE     Status: Normal (Preliminary result)   Collection Time   05/08/12  8:34 AM      Component Value Range Status Comment   Specimen Description CYSTOSCOPY URINE, CATHETERIZED RIGHT RENAL PELVIS   Final    Special Requests NONE   Final    Culture  Setup Time [id number]   Final    Colony Count >=100,000 COLONIES/ML   Final    Culture     Final    Value: STAPHYLOCOCCUS AUREUS     Note: RIFAMPIN AND GENTAMICIN SHOULD NOT BE USED AS SINGLE DRUGS FOR TREATMENT OF STAPH INFECTIONS.   Report Status PENDING   Incomplete      Hospital Course: She was admitted with abdominal pain but appeared to be related to kidney stone. She also was noted to have a urinary tract infection. She has uncontrolled diabetes. She underwent stenting of her ureter and the  plan is for her to have the stone crushed later. She was started on insulin and on Levaquin and improved. By the time of discharge she was back to baseline.  Discharge Exam: Blood pressure 124/81, pulse 92, temperature 98.2 F (36.8 C), temperature source Oral, resp. rate 21, height 4\' 8"  (1.422 m), weight 87.4 kg (192 lb 10.9 oz), last menstrual period 01/17/2012, SpO2 98.00%. She is awake and alert and looks very comfortable. Her chest is clear. Heart is regular. Her pain is well controlled.  Disposition: Home she will need to return for the stone procedure and she will be out of work at least until after that is done  Discharge Orders    Future Appointments: Provider: Department: Dept Phone: Center:   05/19/2012 2:30 PM Jacqualine Code, OTR [facility] [phone] None   06/11/2012 4:00 PM Vickki Hearing, MD [facility] [phone] [facility]     Future Orders Please Complete By Expires   Discharge patient      Discharge patient         Follow-up Information    Follow up with NIDA,GEBRESELASSIE, MD in 2 weeks.   Contact information:    [address] [place] [address] [phone]          Signed: Fredirick Maudlin Pager [phone]  05/10/2012, 10:23 AM

## 2012-05-10 NOTE — Progress Notes (Addendum)
Patient watched all three of the diabetes movies. She is great about asking questions. She states she knows she is going to have to change her diet. She and the RN talked about the difference between lantus and novolog as well as the signs of hypoglycemia. Patient also stated her aunt had diabetes and had to get the shots, so she knew a lot of what was in the movies. She also talked about the importance of moving around sites and has demonstrated giving herself a shot twice.

## 2012-05-10 NOTE — Progress Notes (Signed)
Patient drew up the insulin and administered the insulin.

## 2012-05-10 NOTE — Progress Notes (Signed)
Patient drew up her insulin and administered the insulin.

## 2012-05-10 NOTE — Discharge Instructions (Signed)
Out of work until at least next Thursday the 6th

## 2012-05-10 NOTE — Progress Notes (Signed)
Subjective: She feels well and says she wants to go home. She has learned to give herself insulin shots. Her pain is much better controlled.  Objective: Vital signs in last 24 hours: Temp:  [97.4 F (36.3 C)-98.5 F (36.9 C)] 98.2 F (36.8 C) (06/01 0600) Pulse Rate:  [92-128] 92  (06/01 0600) Resp:  [16-22] 21  (06/01 0800) BP: (104-125)/(74-84) 124/81 mmHg (06/01 0600) SpO2:  [94 %-99 %] 98 % (06/01 0800) Weight change:  Last BM Date: 05/06/12  Intake/Output from previous day: 05/31 0701 - 06/01 0700 In: 1228 [P.O.:960; I.V.:18; IV Piggyback:250] Out: 2451 [Urine:2451]  PHYSICAL EXAM General appearance: alert, cooperative and no distress Resp: clear to auscultation bilaterally Cardio: regular rate and rhythm, S1, S2 normal, no murmur, click, rub or gallop GI: soft, non-tender; bowel sounds normal; no masses,  no organomegaly Extremities: extremities normal, atraumatic, no cyanosis or edema  Lab Results:    Basic Metabolic Panel:  Basename 05/09/12 0520  NA 135  K 3.4*  CL 96  CO2 29  GLUCOSE 174*  BUN 13  CREATININE 0.81  CALCIUM 9.0  MG --  PHOS --   Liver Function Tests: No results found for this basename: AST:2,ALT:2,ALKPHOS:2,BILITOT:2,PROT:2,ALBUMIN:2 in the last 72 hours No results found for this basename: LIPASE:2,AMYLASE:2 in the last 72 hours No results found for this basename: AMMONIA:2 in the last 72 hours CBC:  Basename 05/09/12 0520  WBC 10.5  NEUTROABS --  HGB 12.1  HCT 37.3  MCV 80.0  PLT 390   Cardiac Enzymes: No results found for this basename: CKTOTAL:3,CKMB:3,CKMBINDEX:3,TROPONINI:3 in the last 72 hours BNP: No results found for this basename: PROBNP:3 in the last 72 hours D-Dimer: No results found for this basename: DDIMER:2 in the last 72 hours CBG:  Basename 05/10/12 0726 05/09/12 2212 05/09/12 1626 05/09/12 1119 05/09/12 0734 05/08/12 2100  GLUCAP 145* 138* 175* 241* 158* 199*   Hemoglobin A1C: No results found for this  basename: HGBA1C in the last 72 hours Fasting Lipid Panel: No results found for this basename: CHOL,HDL,LDLCALC,TRIG,CHOLHDL,LDLDIRECT in the last 72 hours Thyroid Function Tests: No results found for this basename: TSH,T4TOTAL,FREET4,T3FREE,THYROIDAB in the last 72 hours Anemia Panel: No results found for this basename: VITAMINB12,FOLATE,FERRITIN,TIBC,IRON,RETICCTPCT in the last 72 hours Coagulation: No results found for this basename: LABPROT:2,INR:2 in the last 72 hours Urine Drug Screen: Drugs of Abuse  No results found for this basename: labopia, cocainscrnur, labbenz, amphetmu, thcu, labbarb    Alcohol Level: No results found for this basename: ETH:2 in the last 72 hours Urinalysis: No results found for this basename: COLORURINE:2,APPERANCEUR:2,LABSPEC:2,PHURINE:2,GLUCOSEU:2,HGBUR:2,BILIRUBINUR:2,KETONESUR:2,PROTEINUR:2,UROBILINOGEN:2,NITRITE:2,LEUKOCYTESUR:2 in the last 72 hours Misc. Labs:  ABGS No results found for this basename: PHART,PCO2,PO2ART,TCO2,HCO3 in the last 72 hours CULTURES Recent Results (from the past 240 hour(s))  SURGICAL PCR SCREEN     Status: Normal   Collection Time   05/07/12  5:04 PM      Component Value Range Status Comment   MRSA, PCR NEGATIVE  NEGATIVE  Final    Staphylococcus aureus NEGATIVE  NEGATIVE  Final   URINE CULTURE     Status: Normal (Preliminary result)   Collection Time   05/08/12  8:34 AM      Component Value Range Status Comment   Specimen Description CYSTOSCOPY URINE, CATHETERIZED RIGHT RENAL PELVIS   Final    Special Requests NONE   Final    Culture  Setup Time [id number]   Final    Colony Count >=100,000 COLONIES/ML   Final    Culture  Final    Value: STAPHYLOCOCCUS AUREUS     Note: RIFAMPIN AND GENTAMICIN SHOULD NOT BE USED AS SINGLE DRUGS FOR TREATMENT OF STAPH INFECTIONS.   Report Status PENDING   Incomplete    Studies/Results: No results found.  Medications:  Prior to Admission:  Prescriptions prior to admission    Medication Sig Dispense Refill  . DISCONTD: metFORMIN (GLUCOPHAGE) 500 MG tablet Take 500 mg by mouth daily with lunch.       . DISCONTD: cyclobenzaprine (FLEXERIL) 10 MG tablet       . DISCONTD: naproxen (NAPROSYN) 500 MG tablet        Scheduled:   . acetaminophen      . bd getting started take home kit  1 kit Other Once  . cefTRIAXone (ROCEPHIN)  IV  1 g Intravenous Q24H  . HYDROmorphone PCA 0.3 mg/mL   Intravenous Q4H  . insulin aspart  0-15 Units Subcutaneous TID WC  . insulin aspart  15 Units Subcutaneous TID WC  . insulin glargine  40 Units Subcutaneous QHS  . levofloxacin (LEVAQUIN) IV  500 mg Intravenous Q24H   Continuous:  RUE:[id number], diphenhydrAMINE, diphenhydrAMINE, ondansetron (ZOFRAN) IV, oxyCODONE-acetaminophen, sodium chloride, DISCONTD: fentaNYL, DISCONTD: naloxone  Assesment: She was admitted with a kidney stone. She is also diabetic. She has learned to give herself insulin. She says her pain is well controlled but she is still on a PCA pump. I would like to make sure that her pain is controlled off the pump before I discharge her Active Problems:  * No active hospital problems. *     Plan: Discontinue PCA pump started on Percocet and she does okay she can be discharged later today    LOS: 3 days   [name] L 05/10/2012, 10:14 AM

## 2012-05-11 LAB — URINE CULTURE
Colony Count: >=100000
Culture  Setup Time: 201305301438

## 2012-05-12 NOTE — Progress Notes (Signed)
NAMELETIZIA, HOOK NO.:  [id number]  MEDICAL RECORD NO.:  [id number]  LOCATION:                                 FACILITY:  PHYSICIAN:  Ky Barban, M.D.DATE OF BIRTH:  [birth year]  DATE OF PROCEDURE:  05/10/2012 DATE OF DISCHARGE:  05/10/2012                                PROGRESS NOTE   This 46 year old female has right pyelonephritis with right pyonephrosis with obstructing UPJ calculus.  I had to put a double-J stent.  She is also diabetic.  Now, she is doing much better.  She is feeling fine and her family physician has discharged her today.  I told her that I will see her back in the office on Monday so we can schedule lithotripsy, and her temperature is normal, pulse is 82 per minute.  Her Accu-Chek this morning is 155.  Yesterday, her sodium 135, potassium 3.4, chloride 96, CO2 is 29 and BUN is 13, creatinine 0.81, WBC count has come down to 10.5, hematocrit is 37.3, so she is being discharged on Levaquin.  Has positive urine culture, and I will see her back in the office on Monday, so I can schedule lithotripsy.     Ky Barban, M.D.     MIJ/MEDQ  D:  05/10/2012  T:  05/10/2012  Job:  [id number]

## 2012-05-13 ENCOUNTER — Encounter (HOSPITAL_COMMUNITY): Payer: Self-pay | Admitting: Urology

## 2012-05-19 ENCOUNTER — Ambulatory Visit (HOSPITAL_COMMUNITY)
Admission: RE | Admit: 2012-05-19 | Payer: No Typology Code available for payment source | Source: Ambulatory Visit | Admitting: Specialist

## 2012-06-02 ENCOUNTER — Other Ambulatory Visit (HOSPITAL_COMMUNITY): Payer: Self-pay | Admitting: Urology

## 2012-06-02 DIAGNOSIS — N23 Unspecified renal colic: Secondary | ICD-10-CM

## 2012-06-03 ENCOUNTER — Emergency Department (HOSPITAL_COMMUNITY)
Admission: EM | Admit: 2012-06-03 | Discharge: 2012-06-03 | Disposition: A | Discharge status: Home / Self Care | Payer: No Typology Code available for payment source | Attending: Emergency Medicine | Admitting: Emergency Medicine

## 2012-06-03 ENCOUNTER — Emergency Department (HOSPITAL_COMMUNITY): Payer: No Typology Code available for payment source

## 2012-06-03 ENCOUNTER — Encounter (HOSPITAL_COMMUNITY): Payer: Self-pay | Admitting: Emergency Medicine

## 2012-06-03 ENCOUNTER — Ambulatory Visit (HOSPITAL_COMMUNITY): Payer: No Typology Code available for payment source

## 2012-06-03 DIAGNOSIS — R109 Unspecified abdominal pain: Secondary | ICD-10-CM | POA: Insufficient documentation

## 2012-06-03 DIAGNOSIS — E119 Type 2 diabetes mellitus without complications: Secondary | ICD-10-CM | POA: Insufficient documentation

## 2012-06-03 DIAGNOSIS — Z794 Long term (current) use of insulin: Secondary | ICD-10-CM | POA: Insufficient documentation

## 2012-06-03 DIAGNOSIS — Z9889 Other specified postprocedural states: Secondary | ICD-10-CM | POA: Insufficient documentation

## 2012-06-03 DIAGNOSIS — N201 Calculus of ureter: Secondary | ICD-10-CM | POA: Insufficient documentation

## 2012-06-03 DIAGNOSIS — N200 Calculus of kidney: Secondary | ICD-10-CM

## 2012-06-03 LAB — URINALYSIS, ROUTINE W REFLEX MICROSCOPIC
Ketones, ur: NEGATIVE mg/dL
Nitrite: POSITIVE — AB
pH: 6 (ref 5.0–8.0)

## 2012-06-03 LAB — BASIC METABOLIC PANEL
Calcium: 8.9 mg/dL (ref 8.4–10.5)
Creatinine, Ser: 0.63 mg/dL (ref 0.50–1.10)
GFR calc Af Amer: >90 mL/min (ref >90)

## 2012-06-03 LAB — URINE MICROSCOPIC-ADD ON

## 2012-06-03 MED ORDER — ONDANSETRON HCL 4 MG/2ML IJ SOLN
4.0000 mg | Freq: Once | INTRAMUSCULAR | Status: AC
  Administered 2012-06-03: 4 mg via INTRAVENOUS
  Filled 2012-06-03: qty 2

## 2012-06-03 MED ORDER — KETOROLAC TROMETHAMINE 30 MG/ML IJ SOLN
30.0000 mg | Freq: Once | INTRAMUSCULAR | Status: AC
  Administered 2012-06-03: 30 mg via INTRAVENOUS
  Filled 2012-06-03: qty 1

## 2012-06-03 MED ORDER — SODIUM CHLORIDE 0.9 % IV SOLN
Freq: Once | INTRAVENOUS | Status: AC
  Administered 2012-06-03: 06:00:00 via INTRAVENOUS

## 2012-06-03 MED ORDER — HYDROMORPHONE HCL PF 1 MG/ML IJ SOLN
1.0000 mg | Freq: Once | INTRAMUSCULAR | Status: AC
  Administered 2012-06-03: 1 mg via INTRAVENOUS
  Filled 2012-06-03: qty 1

## 2012-06-03 MED ORDER — ONDANSETRON HCL 4 MG/2ML IJ SOLN: 4.0000 mg | Freq: Once | INTRAMUSCULAR | Status: DC

## 2012-06-03 NOTE — ED Notes (Signed)
Pt complains of right sided flank pain. States she found out she had 2 large kidney stones 3 weeks ago at [facility] States they did a lithotripsy at [facility], states it broke one of the stones up but not the other. Also states she was admitted here at [facility] prior to being referred to [facility] for the lithotripsy. Pt states that over the past few days she has been in more pain and is having trouble urinating.

## 2012-06-03 NOTE — Discharge Instructions (Signed)
Go to Dr. Rudean Haskell office between 2-5 pm today. Continue to use your pain medicine as needed.

## 2012-06-03 NOTE — ED Provider Notes (Signed)
History     CSN: [id number]  Arrival date & time 06/03/12  4098   First MD Initiated Contact with Patient 06/03/12 0545      Chief Complaint  Patient presents with  . Flank Pain    (Consider location/radiation/quality/duration/timing/severity/associated sxs/prior treatment) HPI [name] is a 46 y.o. female with a h/o kidney stones, recent cystoscopy with stent placement, and lithotripy who presents to the Emergency Department complaining of continued right sided flank pain associated with increased difficulty urinating. She is being followed by Dr. Jerre Simon, urologist. She was scheduled for a CT scan later today to evaluate her continued pain.She has an appointment with Dr. Jerre Simon tomorrow at 9:30 AM.  PCP Dr. Felecia Shelling Urologist Dr. Jerre Simon  Past Medical History  Diagnosis Date  . Diabetes mellitus     Controlled by diet  . Cancer  spot on lung last year     Past Surgical History  Procedure Date  . Cesarean section 1994;2000    [place]  . Cholecystectomy 1992  . Sinus sergery 1988    [place]  . Foot surgery March, 2013    [facility]  . Hysteroscopy with thermachoice 04/25/2012    Procedure: HYSTEROSCOPY WITH THERMACHOICE;  Surgeon: Lazaro Arms, MD;  Location: [facility];  Service: Gynecology;  Laterality: N/A;  total therapy time= 11 minutes 42 seconds; 34 ml D5w  in and 34 ml D5w out; temp =87 degrees F  . Cystoscopy w/ ureteral stent placement 05/08/2012    Procedure: CYSTOSCOPY WITH RETROGRADE PYELOGRAM/URETERAL STENT PLACEMENT;  Surgeon: Ky Barban, MD;  Location: [facility];  Service: Urology;  Laterality: Right;    Family History  Problem Relation Age of Onset  . Heart disease    . Cancer    . Diabetes    . Achalasia    . Anesthesia problems Neg Hx   . Hypotension Neg Hx   . Malignant hyperthermia Neg Hx   . Pseudochol deficiency Neg Hx     History  Substance Use Topics  . Smoking status: Never Smoker   . Smokeless tobacco: Not on file  .  Alcohol Use: No    OB History    Grav Para Term Preterm Abortions TAB SAB Ect Mult Living                  Review of Systems  Constitutional: Negative for fever.       10 Systems reviewed and are negative for acute change except as noted in the HPI.  HENT: Negative for congestion.   Eyes: Negative for discharge and redness.  Respiratory: Negative for cough and shortness of breath.   Cardiovascular: Negative for chest pain.  Gastrointestinal: Negative for vomiting and abdominal pain.  Genitourinary: Positive for flank pain and difficulty urinating.  Musculoskeletal: Negative for back pain.  Skin: Negative for rash.  Neurological: Negative for syncope, numbness and headaches.  Psychiatric/Behavioral:       No behavior change.    Allergies  Ciprofloxacin; Codeine; Morphine; Morphine and related; Penicillins; and Sulfonamide derivatives  Home Medications   Current Outpatient Rx  Name Route Sig Dispense Refill  . INSULIN ASPART 100 UNIT/ML [place] SOLN Subcutaneous Inject 15 Units into the skin 3 (three) times daily with meals. 1 vial 5  . INSULIN GLARGINE 100 UNIT/ML [place] SOLN Subcutaneous Inject 40 Units into the skin at bedtime. 10 mL 5    BP 126/86  Pulse 81  Temp 98 F (36.7 C)  Resp 16  Ht 4'  8" (1.422 m)  SpO2 97%  Physical Exam  Nursing note and vitals reviewed. Constitutional: She is oriented to person, place, and time. She appears well-developed and well-nourished. No distress.       Awake, alert, nontoxic appearance.  HENT:  Head: Normocephalic and atraumatic.  Eyes: Pupils are equal, round, and reactive to light. Right eye exhibits no discharge. Left eye exhibits no discharge.  Neck: Neck supple.  Cardiovascular: Normal rate and normal heart sounds.   Pulmonary/Chest: Effort normal. She exhibits no tenderness.  Abdominal: Soft. There is no tenderness. There is no rebound.  Genitourinary:       Right cva tenderness  Musculoskeletal: She exhibits no tenderness.         Baseline ROM, no obvious new focal weakness.  Neurological: She is alert and oriented to person, place, and time.       Mental status and motor strength appears baseline for patient and situation.  Skin: No rash noted.  Psychiatric: She has a normal mood and affect.    ED Course  Procedures (including critical care time)   Labs Reviewed  URINALYSIS, ROUTINE W REFLEX MICROSCOPIC  BASIC METABOLIC PANEL   Results for orders placed during the hospital encounter of 06/03/12  URINALYSIS, ROUTINE W REFLEX MICROSCOPIC      Component Value Range   Color, Urine YELLOW  YELLOW   APPearance CLOUDY (*) CLEAR   Specific Gravity, Urine 1.025  1.005 - 1.030   pH 6.0  5.0 - 8.0   Glucose, UA NEGATIVE  NEGATIVE mg/dL   Hgb urine dipstick MODERATE (*) NEGATIVE   Bilirubin Urine NEGATIVE  NEGATIVE   Ketones, ur NEGATIVE  NEGATIVE mg/dL   Protein, ur TRACE (*) NEGATIVE mg/dL   Urobilinogen, UA 0.2  0.0 - 1.0 mg/dL   Nitrite POSITIVE (*) NEGATIVE   Leukocytes, UA MODERATE (*) NEGATIVE  BASIC METABOLIC PANEL      Component Value Range   Sodium 137  135 - 145 mEq/L   Potassium 3.8  3.5 - 5.1 mEq/L   Chloride 102  96 - 112 mEq/L   CO2 24  19 - 32 mEq/L   Glucose, Bld 129 (*) 70 - 99 mg/dL   BUN 8  6 - 23 mg/dL   Creatinine, Ser 1.61  0.50 - 1.10 mg/dL   Calcium 8.9  8.4 - 09.6 mg/dL   GFR calc non Af Amer >90  >90 mL/min   GFR calc Af Amer >90  >90 mL/min  URINE MICROSCOPIC-ADD ON      Component Value Range   Squamous Epithelial / LPF MANY (*) RARE   WBC, UA TOO NUMEROUS TO COUNT  <3 WBC/hpf   RBC / HPF TOO NUMEROUS TO COUNT  <3 RBC/hpf   Bacteria, UA MANY (*) RARE    0630 Advised by radiology tech of technical difficulties with current PAX. CT abdomen and pelvis with right UVJ stone, several large stones in the right kidney and at least one in the left kidney.  6:40 AM:  T/C to Dr. Jerre Simon, urologist, case discussed, including:  HPI, pertinent PM/SHx, VS/PE, dx testing, ED course and  treatment. He advised patient could come to his office this afternoon between 2-5 pm. He will decide if she needs additional lithotripsy.   MDM  Patient with history of kidney stones, recent cystoscopy with stent placement,stent removal, lithotripsy, with continued right flank pain.CT with right UVJ stone. No hydro. BUN/Cr normal.Patient requested I call the urologist. Spoke with Dr. Jerre Simon. He recommended  discharge for follow up in his office this afternoon. He will make a decision regarding additional lithotripsy. Pt stable in ED with no significant deterioration in condition.The patient appears reasonably screened and/or stabilized for discharge and I doubt any other medical condition or other [facility] requiring further screening, evaluation, or treatment in the ED at this time prior to discharge. MDM Reviewed: nursing note and vitals Interpretation: CT scan and labs           [facility]. Colon Branch, MD 06/03/12 [phone]

## 2012-06-11 ENCOUNTER — Ambulatory Visit: Payer: No Typology Code available for payment source | Admitting: Orthopedic Surgery

## 2012-06-16 ENCOUNTER — Ambulatory Visit: Payer: No Typology Code available for payment source | Admitting: Orthopedic Surgery

## 2012-06-17 ENCOUNTER — Encounter: Payer: Self-pay | Admitting: Orthopedic Surgery

## 2012-06-23 ENCOUNTER — Emergency Department (HOSPITAL_COMMUNITY): Payer: No Typology Code available for payment source

## 2012-06-23 ENCOUNTER — Inpatient Hospital Stay (HOSPITAL_COMMUNITY)
Admission: EM | Admit: 2012-06-23 | Discharge: 2012-06-25 | DRG: 690 | Disposition: A | Discharge status: Home / Self Care | Payer: No Typology Code available for payment source | Attending: Internal Medicine | Admitting: Internal Medicine

## 2012-06-23 ENCOUNTER — Encounter (HOSPITAL_COMMUNITY): Payer: Self-pay | Admitting: Emergency Medicine

## 2012-06-23 DIAGNOSIS — E785 Hyperlipidemia, unspecified: Secondary | ICD-10-CM | POA: Diagnosis present

## 2012-06-23 DIAGNOSIS — A4901 Methicillin susceptible Staphylococcus aureus infection, unspecified site: Secondary | ICD-10-CM | POA: Diagnosis present

## 2012-06-23 DIAGNOSIS — E669 Obesity, unspecified: Secondary | ICD-10-CM | POA: Diagnosis present

## 2012-06-23 DIAGNOSIS — Z794 Long term (current) use of insulin: Secondary | ICD-10-CM

## 2012-06-23 DIAGNOSIS — N200 Calculus of kidney: Secondary | ICD-10-CM | POA: Diagnosis present

## 2012-06-23 DIAGNOSIS — E119 Type 2 diabetes mellitus without complications: Secondary | ICD-10-CM | POA: Diagnosis present

## 2012-06-23 DIAGNOSIS — Z87442 Personal history of urinary calculi: Secondary | ICD-10-CM

## 2012-06-23 DIAGNOSIS — Z6841 Body Mass Index (BMI) 40.0 and over, adult: Secondary | ICD-10-CM

## 2012-06-23 DIAGNOSIS — D649 Anemia, unspecified: Secondary | ICD-10-CM | POA: Diagnosis present

## 2012-06-23 DIAGNOSIS — N12 Tubulo-interstitial nephritis, not specified as acute or chronic: Principal | ICD-10-CM | POA: Diagnosis present

## 2012-06-23 DIAGNOSIS — I10 Essential (primary) hypertension: Secondary | ICD-10-CM | POA: Diagnosis present

## 2012-06-23 LAB — URINALYSIS, ROUTINE W REFLEX MICROSCOPIC
Bilirubin Urine: NEGATIVE
Glucose, UA: NEGATIVE mg/dL
Ketones, ur: NEGATIVE mg/dL
pH: 5.5 (ref 5.0–8.0)

## 2012-06-23 LAB — GLUCOSE, CAPILLARY
Glucose-Capillary: 106 mg/dL — ABNORMAL HIGH (ref 70–99)
Glucose-Capillary: 101 mg/dL — ABNORMAL HIGH (ref 70–99)

## 2012-06-23 LAB — CBC
HCT: 38.2 % (ref 36.0–46.0)
Hemoglobin: 12.4 g/dL (ref 12.0–15.0)
MCH: 27 pg (ref 26.0–34.0)
MCHC: 32.5 g/dL (ref 30.0–36.0)
MCV: 83 fL (ref 78.0–100.0)

## 2012-06-23 LAB — POCT I-STAT, CHEM 8
Creatinine, Ser: 0.7 mg/dL (ref 0.50–1.10)
Glucose, Bld: 163 mg/dL — ABNORMAL HIGH (ref 70–99)
Hemoglobin: 12.9 g/dL (ref 12.0–15.0)
Potassium: 4.2 mEq/L (ref 3.5–5.1)
TCO2: 24 mmol/L (ref 0–100)

## 2012-06-23 LAB — URINE MICROSCOPIC-ADD ON

## 2012-06-23 MED ORDER — INSULIN ASPART 100 UNIT/ML ~~LOC~~ SOLN
0.0000 [IU] | Freq: Three times a day (TID) | SUBCUTANEOUS | Status: DC
  Administered 2012-06-24 (×2): 2 [IU] via SUBCUTANEOUS
  Administered 2012-06-25: 3 [IU] via SUBCUTANEOUS

## 2012-06-23 MED ORDER — SODIUM CHLORIDE 0.9 % IV SOLN
INTRAVENOUS | Status: DC
  Administered 2012-06-24 (×2): via INTRAVENOUS

## 2012-06-23 MED ORDER — ONDANSETRON HCL 4 MG/2ML IJ SOLN
4.0000 mg | Freq: Four times a day (QID) | INTRAMUSCULAR | Status: DC | PRN
  Administered 2012-06-23 – 2012-06-24 (×2): 4 mg via INTRAVENOUS
  Filled 2012-06-23 (×2): qty 2

## 2012-06-23 MED ORDER — ENOXAPARIN SODIUM 40 MG/0.4ML ~~LOC~~ SOLN
40.0000 mg | SUBCUTANEOUS | Status: DC
  Administered 2012-06-23 – 2012-06-25 (×3): 40 mg via SUBCUTANEOUS
  Filled 2012-06-23 (×3): qty 0.4

## 2012-06-23 MED ORDER — INSULIN ASPART 100 UNIT/ML ~~LOC~~ SOLN: 0.0000 [IU] | Freq: Every day | SUBCUTANEOUS | Status: DC

## 2012-06-23 MED ORDER — SODIUM CHLORIDE 0.9 % IV SOLN
INTRAVENOUS | Status: DC
  Administered 2012-06-23: 12:00:00 via INTRAVENOUS

## 2012-06-23 MED ORDER — INSULIN ASPART 100 UNIT/ML ~~LOC~~ SOLN
15.0000 [IU] | Freq: Three times a day (TID) | SUBCUTANEOUS | Status: DC
  Administered 2012-06-24 – 2012-06-25 (×3): 15 [IU] via SUBCUTANEOUS

## 2012-06-23 MED ORDER — HYDROMORPHONE HCL PF 1 MG/ML IJ SOLN: 1.0000 mg | Freq: Once | INTRAMUSCULAR | Status: DC

## 2012-06-23 MED ORDER — HYDROMORPHONE HCL PF 1 MG/ML IJ SOLN
1.0000 mg | INTRAMUSCULAR | Status: DC | PRN
  Administered 2012-06-23 – 2012-06-24 (×3): 1 mg via INTRAVENOUS
  Filled 2012-06-23 (×3): qty 1

## 2012-06-23 MED ORDER — ONDANSETRON HCL 4 MG PO TABS
4.0000 mg | ORAL_TABLET | Freq: Four times a day (QID) | ORAL | Status: DC | PRN
  Administered 2012-06-24: 4 mg via ORAL
  Filled 2012-06-23: qty 1

## 2012-06-23 MED ORDER — ONDANSETRON HCL 4 MG/2ML IJ SOLN
4.0000 mg | Freq: Once | INTRAMUSCULAR | Status: AC
  Administered 2012-06-23: 4 mg via INTRAVENOUS
  Filled 2012-06-23: qty 2

## 2012-06-23 MED ORDER — INSULIN GLARGINE 100 UNIT/ML ~~LOC~~ SOLN
40.0000 [IU] | Freq: Every day | SUBCUTANEOUS | Status: DC
  Administered 2012-06-24: 40 [IU] via SUBCUTANEOUS

## 2012-06-23 MED ORDER — HYDROMORPHONE HCL PF 2 MG/ML IJ SOLN
INTRAMUSCULAR | Status: AC
  Filled 2012-06-23: qty 1

## 2012-06-23 MED ORDER — LEVOFLOXACIN IN D5W 500 MG/100ML IV SOLN
500.0000 mg | INTRAVENOUS | Status: DC
  Administered 2012-06-24 – 2012-06-25 (×2): 500 mg via INTRAVENOUS
  Filled 2012-06-23 (×3): qty 100

## 2012-06-23 MED ORDER — HYDROMORPHONE HCL PF 1 MG/ML IJ SOLN
1.0000 mg | Freq: Once | INTRAMUSCULAR | Status: AC
  Administered 2012-06-23: 1 mg via INTRAVENOUS
  Filled 2012-06-23: qty 1

## 2012-06-23 MED ORDER — LEVOFLOXACIN IN D5W 500 MG/100ML IV SOLN
500.0000 mg | Freq: Once | INTRAVENOUS | Status: AC
  Administered 2012-06-23: 500 mg via INTRAVENOUS
  Filled 2012-06-23: qty 100

## 2012-06-23 NOTE — ED Notes (Signed)
Fan given for comfort. Pt hot and slightly sweating. cbg also checked. Nad.

## 2012-06-23 NOTE — ED Notes (Signed)
Report called to Jessica, RN on unit 300 

## 2012-06-23 NOTE — ED Notes (Addendum)
Pt complains of right sided flank pain starting this morning and right sided leg pain, notes a hx of kidney stones, Denies any urinary symptoms or visible hematuria. Also notes right leg swelling she noticed this morning. Also notes Nausea and vomiting

## 2012-06-23 NOTE — ED Provider Notes (Signed)
History     CSN: [id number]  Arrival date & time 06/23/12  0532   First MD Initiated Contact with Patient 06/23/12 0532      Chief Complaint  Patient presents with  . Flank Pain  . Leg Pain    (Consider location/radiation/quality/duration/timing/severity/associated sxs/prior treatment) HPI History provided by patient. Right flank pain sudden onset tonight. Sharp in quality and radiates from flank to her back. Has history of kidney stones and is feels the same. Unable to get comfortable. Has associated nausea and vomiting. No fevers. No dysuria, urgency or frequency but has had cloudy urine today. Patient has history of multiple kidney stones and most recently required admission for stone with UTI. At that time she had a stent placed by Dr. Jerre Simon. Pain is severe. No known aggravating or alleviating factors. Past Medical History  Diagnosis Date  . Diabetes mellitus     Controlled by diet  . Cancer  spot on lung last year     Past Surgical History  Procedure Date  . Cesarean section 1994;2000    [place]  . Cholecystectomy 1992  . Sinus sergery 1988    [place]  . Foot surgery March, 2013    [facility]  . Hysteroscopy with thermachoice 04/25/2012    Procedure: HYSTEROSCOPY WITH THERMACHOICE;  Surgeon: Lazaro Arms, MD;  Location: [facility];  Service: Gynecology;  Laterality: N/A;  total therapy time= 11 minutes 42 seconds; 34 ml D5w  in and 34 ml D5w out; temp =87 degrees F  . Cystoscopy w/ ureteral stent placement 05/08/2012    Procedure: CYSTOSCOPY WITH RETROGRADE PYELOGRAM/URETERAL STENT PLACEMENT;  Surgeon: Ky Barban, MD;  Location: [facility];  Service: Urology;  Laterality: Right;    Family History  Problem Relation Age of Onset  . Heart disease    . Cancer    . Diabetes    . Achalasia    . Anesthesia problems Neg Hx   . Hypotension Neg Hx   . Malignant hyperthermia Neg Hx   . Pseudochol deficiency Neg Hx     History  Substance Use Topics  . Smoking  status: Never Smoker   . Smokeless tobacco: Not on file  . Alcohol Use: No    OB History    Grav Para Term Preterm Abortions TAB SAB Ect Mult Living                  Review of Systems  Constitutional: Negative for fever and chills.  HENT: Negative for neck pain and neck stiffness.   Eyes: Negative for pain.  Respiratory: Negative for shortness of breath.   Cardiovascular: Positive for chest pain.  Gastrointestinal: Positive for nausea and vomiting. Negative for abdominal pain.  Genitourinary: Positive for flank pain. Negative for dysuria.  Musculoskeletal: Negative for back pain.  Skin: Negative for rash.  Neurological: Negative for headaches.  All other systems reviewed and are negative.    Allergies  Ciprofloxacin; Codeine; Morphine; Morphine and related; Penicillins; and Sulfonamide derivatives  Home Medications   Current Outpatient Rx  Name Route Sig Dispense Refill  . INSULIN ASPART 100 UNIT/ML [place] SOLN Subcutaneous Inject 15 Units into the skin 3 (three) times daily with meals. 1 vial 5  . INSULIN GLARGINE 100 UNIT/ML [place] SOLN Subcutaneous Inject 40 Units into the skin at bedtime. 10 mL 5    BP 129/74  Resp 16  Ht 4\' 8"  (1.422 m)  Wt 190 lb (86.183 kg)  BMI 42.60 kg/m2  Physical Exam  Constitutional: She is oriented to person, place, and time. She appears well-developed and well-nourished.  HENT:  Head: Normocephalic and atraumatic.  Eyes: Conjunctivae and EOM are normal. Pupils are equal, round, and reactive to light.  Neck: Trachea normal. Neck supple. No thyromegaly present.  Cardiovascular: Normal rate, regular rhythm, S1 normal, S2 normal and normal pulses.     No systolic murmur is present   No diastolic murmur is present  Pulses:      Radial pulses are 2+ on the right side, and 2+ on the left side.  Pulmonary/Chest: Effort normal and breath sounds normal. She has no wheezes. She has no rhonchi. She has no rales. She exhibits no tenderness.    Abdominal: Soft. Normal appearance and bowel sounds are normal. There is no CVA tenderness and negative Murphy's sign.       Tender over right flank without erythema or ecchymosis. No abdominal tenderness otherwise and no guarding or rebound.  Musculoskeletal:       BLE:s Calves nontender, no cords or erythema, negative Homans sign  Neurological: She is alert and oriented to person, place, and time. She has normal strength. No cranial nerve deficit or sensory deficit. GCS eye subscore is 4. GCS verbal subscore is 5. GCS motor subscore is 6.  Skin: Skin is warm and dry. No rash noted. She is not diaphoretic.  Psychiatric: Her speech is normal.       Cooperative and appropriate    ED Course  Procedures (including critical care time)  Results for orders placed during the hospital encounter of 06/23/12  URINALYSIS, ROUTINE W REFLEX MICROSCOPIC      Component Value Range   Color, Urine YELLOW  YELLOW   APPearance CLOUDY (*) CLEAR   Specific Gravity, Urine 1.025  1.005 - 1.030   pH 5.5  5.0 - 8.0   Glucose, UA NEGATIVE  NEGATIVE mg/dL   Hgb urine dipstick LARGE (*) NEGATIVE   Bilirubin Urine NEGATIVE  NEGATIVE   Ketones, ur NEGATIVE  NEGATIVE mg/dL   Protein, ur NEGATIVE  NEGATIVE mg/dL   Urobilinogen, UA 0.2  0.0 - 1.0 mg/dL   Nitrite POSITIVE (*) NEGATIVE   Leukocytes, UA MODERATE (*) NEGATIVE  CBC      Component Value Range   WBC 6.5  4.0 - 10.5 K/uL   RBC 4.60  3.87 - 5.11 MIL/uL   Hemoglobin 12.4  12.0 - 15.0 g/dL   HCT 91.4  78.2 - 95.6 %   MCV 83.0  78.0 - 100.0 fL   MCH 27.0  26.0 - 34.0 pg   MCHC 32.5  30.0 - 36.0 g/dL   RDW 21.3  08.6 - 57.8 %   Platelets 323  150 - 400 K/uL  POCT I-STAT, CHEM 8      Component Value Range   Sodium 138  135 - 145 mEq/L   Potassium 4.2  3.5 - 5.1 mEq/L   Chloride 104  96 - 112 mEq/L   BUN 13  6 - 23 mg/dL   Creatinine, Ser 4.69  0.50 - 1.10 mg/dL   Glucose, Bld 629 (*) 70 - 99 mg/dL   Calcium, Ion 5.28  4.13 - 1.23 mmol/L   TCO2 24   0 - 100 mmol/L   Hemoglobin 12.9  12.0 - 15.0 g/dL   HCT 24.4  01.0 - 27.2 %  URINE MICROSCOPIC-ADD ON      Component Value Range   Squamous Epithelial / LPF FEW (*) RARE   WBC, UA  TOO NUMEROUS TO COUNT  <3 WBC/hpf   RBC / HPF 21-50  <3 RBC/hpf   Bacteria, UA MANY (*) RARE  GLUCOSE, CAPILLARY      Component Value Range   Glucose-Capillary 145 (*) 70 - 99 mg/dL   Ct Abdomen Pelvis Wo Contrast  06/23/2012  *RADIOLOGY REPORT*  Clinical Data: Right flank pain.  CT ABDOMEN AND PELVIS WITHOUT CONTRAST  Technique:  Multidetector CT imaging of the abdomen and pelvis was performed following the standard protocol without intravenous contrast.  Comparison: 06/03/2012.  Findings: Lung bases show mild dependent atelectasis bilaterally. Heart size normal.  No pericardial or pleural effusion.  Liver is unremarkable.  Cholecystectomy.  Right adrenal gland is unremarkable.  An 8 mm nodule in the medial limb of the left adrenal gland is unchanged from 03/24/2008.  Stones are seen in the kidneys bilaterally.  No ureteral dilatation.  Spleen, pancreas and stomach are unremarkable.  Incidental note is made of a duodenal diverticulum.  Small bowel, appendix and colon are otherwise unremarkable.  Small periumbilical hernia contains fat.  Uterus and ovaries are visualized.  No pathologically enlarged lymph nodes.  No free fluid.  No worrisome lytic or sclerotic lesions.  Bilateral pars defects are seen at L5.  No corresponding alignment abnormality.  IMPRESSION:  1.  No acute findings. 2.  Bilateral renal stones. 3.  Small periumbilical hernia contains fat.  Original Report Authenticated By: Reyes Ivan, M.D.    IV fluids. IV Dilaudid. IV Zofran.  Labs UA and imaging obtained and reviewed as above.  Recheck at 7:30 AM has persistent nausea vomiting. Old records reviewed and previous UTI was sensitive to Levaquin. IV Levaquin initiated and medicine consult for admission. MDM   UTI flank pain with persistent  vomiting -  plan admission for pyelo. Nursing notes reviewed. Vital signs reviewed. IV Abx.   7:59 AM d/w DR Felecia Shelling, plan admit.      Sunnie Nielsen, MD 06/23/12 [phone]

## 2012-06-24 LAB — GLUCOSE, CAPILLARY
Glucose-Capillary: 109 mg/dL — ABNORMAL HIGH (ref 70–99)
Glucose-Capillary: 125 mg/dL — ABNORMAL HIGH (ref 70–99)

## 2012-06-24 MED ORDER — ACETAMINOPHEN 500 MG PO TABS
500.0000 mg | ORAL_TABLET | Freq: Four times a day (QID) | ORAL | Status: DC | PRN
  Administered 2012-06-24 – 2012-06-25 (×2): 500 mg via ORAL
  Filled 2012-06-24 (×2): qty 1

## 2012-06-24 NOTE — Progress Notes (Signed)
UR Chart Review Completed  

## 2012-06-24 NOTE — Care Management Note (Signed)
    Page 1 of 1   06/25/2012     11:18:07 AM   CARE MANAGEMENT NOTE 06/25/2012  Patient:  EVANN, KOELZER   Account Number:  [id number]  Date Initiated:  06/24/2012  Documentation initiated by:  Sharrie Rothman  Subjective/Objective Assessment:   Pt admitted from home with UTI and pyelonephritis. Pt lives with husband and will return home with the husband. Pt is independent with ADL's. Pt requested to talk with financial counselor.     Action/Plan:   Financial counselor consulted. Pt has no HH or CM needs.   Anticipated DC Date:  06/24/2012   Anticipated DC Plan:  HOME/SELF CARE  In-house referral  Financial Counselor      DC Planning Services  CM consult      Choice offered to / List presented to:             Status of service:  Completed, signed off Medicare Important Message given?   (If response is "NO", the following Medicare IM given date fields will be blank) Date Medicare IM given:   Date Additional Medicare IM given:    Discharge Disposition:  HOME/SELF CARE  Per UR Regulation:    If discussed at Long Length of Stay Meetings, dates discussed:    Comments:  06/24/12 1441 Arlyss Queen, RN BSN CM

## 2012-06-24 NOTE — H&P (Signed)
[name], [name]                 ACCOUNT NO.:  [id number]  MEDICAL RECORD NO.:  [id number]  LOCATION:  A326                          FACILITY:  [facility]  PHYSICIAN:  Haaris Metallo D. Felecia Shelling, MD   DATE OF BIRTH:  05-29-1966  DATE OF ADMISSION:  06/23/2012 DATE OF DISCHARGE:  LH                             HISTORY & PHYSICAL   CHIEF COMPLAINT:  Nausea, vomiting, and lower abdominal pain.  HISTORY OF PRESENT ILLNESS:  This is a 46 year old female patient with history of diabetes mellitus, hypertension, and renal calculi who came to emergency room with above complaint.  The patient was recently admitted with hydronephrosis secondary to renal calculi.  She has been on followup with her urologist.  She had cystoscopy and double J stent placement.  Later on she was scheduled for lithotripsy.  However, in the last few days the patient started having chills, nausea, vomiting, and abdominal pain.  She came to emergency room and was found to have sign of pyelonephritis.  CT scan of the abdomen showed 5-mm distal right ureteral calculi with mild right hydronephrosis.  The patient was started on IV antibiotics and she was admitted for further treatment.  REVIEW OF SYSTEMS:  No fever, chills, cough, shortness of breath, or chest pain.  PAST MEDICAL HISTORY: 1. Diabetes mellitus. 2. Hypertension. 3. Hyperlipidemia. 4. Obesity. 5. Mild anemia.  CURRENT MEDICATIONS: 1. Lantus insulin 20 units subcu at bedtime. 2. NovoLog insulin 50 units with meals. 3. Percocet 5/325 one tablet p.o. q.6 hours p.r.n.  SOCIAL HISTORY:  The patient has no history of alcohol, tobacco, or substance abuse.  FAMILY HISTORY:  The patient has diabetes mellitus, cancer, and heart disease.  Both father and mother are alive.  PHYSICAL EXAMINATION:  GENERAL:  The patient is alert, awake, and sick looking. VITAL SIGNS:  Blood pressure 120/71, pulse 76, respiratory rate 20, temperature 98.4 degrees Fahrenheit. HEENT:   Pupils are equal and reactive. NECK:  Supple. CHEST:  Clear lung fields.  Good air entry. CARDIOVASCULAR:  First and second heart sounds heard.  No murmur.  No gallop. ABDOMEN:  Soft and lax.  Bowel sound is positive.  No mass or organomegaly. EXTREMITIES:  No leg edema.  LABORATORY DATA:  CBC:  WBC 6.5, hemoglobin 12.4, hematocrit 38.2, and platelets 323.  Blood chemistry; sodium 138, potassium 4.2, chloride 104, BUN 13, creatinine 0.7, glucose 163, and ionized calcium 1.18. Urinalysis; WBC too many to count, bacteria many, pH 5.5, specific gravity 1.025, nitrite positive, glucose trace moderate.  ASSESSMENT: 1. Pyelonephritis. 2. Renal calculi. 3. Diabetes mellitus. 4. Hypertension. 5. Obesity.  PLAN:  We will continue the patient with IV antibiotics.  We will keep the patient n.p.o. until her nausea and vomiting improves.  We will continue p.r.n. Reglan.  We will continue sliding scale insulin.  We will continue to monitor her blood sugar.     Hazleigh Mccleave D. Felecia Shelling, MD     TDF/MEDQ  D:  06/24/2012  T:  06/24/2012  Job:  [id number]

## 2012-06-24 NOTE — Progress Notes (Signed)
[name], [name]                 ACCOUNT NO.:  [id number]  MEDICAL RECORD NO.:  [id number]  LOCATION:  A326                          FACILITY:  [facility]  PHYSICIAN:  Hulen Mandler D. Felecia Shelling, MD   DATE OF BIRTH:  [birth year]  DATE OF PROCEDURE:  06/24/2012 DATE OF DISCHARGE:                                PROGRESS NOTE   SUBJECTIVE:  The patient feels slightly better today.  She just feels nauseated.  She had no vomiting.  She is going to try some clear liquid diet.  No fever or chills.  Her abdominal pain is less.  OBJECTIVE:  GENERAL:  The patient is alert, awake, sick looking. VITAL SIGNS:  Blood pressure 112/69, pulse 70, respiratory rate 20, temperature 97.9 degrees Fahrenheit. CHEST:  Clear lung fields with good air entry. CARDIOVASCULAR:  First and second heart sounds heard.  No murmur.  No gallop. ABDOMEN:  Soft and lax.  Bowel sounds positive.  No mass or organomegaly. EXTREMITIES.  No leg edema.  ASSESSMENT: 1. Pyelonephritis. 2. Renal calculi. 3. Diabetes mellitus. 4. Obesity.  PLAN:  We will continue the patient on IV antibiotics.  We will try clear liquid diet.  We will continue monitoring her blood sugar.  We will decrease IV fluids to 50 mL/h.  We will follow culture results.     Cheron Coryell D. Felecia Shelling, MD     TDF/MEDQ  D:  06/24/2012  T:  06/24/2012  Job:  [id number]

## 2012-06-25 MED ORDER — LEVOFLOXACIN 500 MG PO TABS: 500.0000 mg | ORAL_TABLET | Freq: Every day | ORAL | Status: AC

## 2012-06-25 NOTE — Discharge Summary (Signed)
[name], [name]                 ACCOUNT NO.:  [id number]  MEDICAL RECORD NO.:  [id number]  LOCATION:  A326                          FACILITY:  [facility]  PHYSICIAN:  Michiel Sivley D. Felecia Shelling, MD   DATE OF BIRTH:  03/31/1966  DATE OF ADMISSION:  06/23/2012 DATE OF DISCHARGE:  07/17/2013LH                              DISCHARGE SUMMARY   DISCHARGE DIAGNOSES: 1. Pyelonephritis. 2. Renal calculi. 3. Diabetes mellitus. 4. Obesity.  DISCHARGE MEDICATIONS: 1. Levaquin 500 mg p.o. daily for 10 days. 2. Lantus insulin 40 units subcu at bedtime. 3. NovoLog insulin 15 units p.o. t.i.d. with meals.  DISPOSITION:  The patient will be discharged home in stable condition.  DISCHARGE INSTRUCTIONS:  The patient is advised to continue oral antibiotics and her regular medications.  She will be followed in the office in 1-week duration.  LABORATORY DATA:  On discharge, BMP:  Sodium 138, potassium 4.2 chloride 104, BUN 13, creatinine 0.7, glucose 163, carbon dioxide 24, and ionized calcium 1.18.  CBC:  WBC 6.5, hemoglobin 12.4, hematocrit 38.2, and platelets 324.  Urine culture is growing Staph aureus.  HOSPITAL COURSE:  This is a 46 year old female patient with history of multiple medical illnesses, came to the emergency room with nausea vomiting and flank pain.  She has history of renal calculi with hydronephrosis.  She had a stent placement and lithotripsy.  She was on followup with her urologist.  During evaluation in the emergency room, her CT scan showed bilateral renal calculi without hydronephrosis.  Her urinalysis was abnormal.  She was started on IV antibiotics.  Her urine culture is growing Staph aureus, which is sensitive to the current antibiotics.  She will be discharged with oral antibiotics and that will be followed in the office.    Tyheim Vanalstyne D. Felecia Shelling, MD    TDF/MEDQ  D:  06/25/2012  T:  06/25/2012  Job:  [id number]

## 2012-06-25 NOTE — Progress Notes (Signed)
Discharge instructions given to pt. With teach back given to RN. Pt. Taken to car via W/C. 

## 2012-06-26 LAB — URINE CULTURE

## 2012-07-01 ENCOUNTER — Emergency Department (HOSPITAL_COMMUNITY)
Admission: EM | Admit: 2012-07-01 | Discharge: 2012-07-01 | Disposition: A | Discharge status: Home / Self Care | Payer: No Typology Code available for payment source | Attending: Emergency Medicine | Admitting: Emergency Medicine

## 2012-07-01 ENCOUNTER — Encounter (HOSPITAL_COMMUNITY): Payer: Self-pay | Admitting: *Deleted

## 2012-07-01 DIAGNOSIS — R112 Nausea with vomiting, unspecified: Secondary | ICD-10-CM

## 2012-07-01 DIAGNOSIS — Z87442 Personal history of urinary calculi: Secondary | ICD-10-CM

## 2012-07-01 DIAGNOSIS — R1110 Vomiting, unspecified: Secondary | ICD-10-CM | POA: Insufficient documentation

## 2012-07-01 DIAGNOSIS — E119 Type 2 diabetes mellitus without complications: Secondary | ICD-10-CM | POA: Insufficient documentation

## 2012-07-01 DIAGNOSIS — Z794 Long term (current) use of insulin: Secondary | ICD-10-CM | POA: Insufficient documentation

## 2012-07-01 DIAGNOSIS — M549 Dorsalgia, unspecified: Secondary | ICD-10-CM | POA: Insufficient documentation

## 2012-07-01 LAB — URINALYSIS, ROUTINE W REFLEX MICROSCOPIC
Glucose, UA: 250 mg/dL — AB
Leukocytes, UA: NEGATIVE
pH: 6 (ref 5.0–8.0)

## 2012-07-01 LAB — CBC
HCT: 36.4 % (ref 36.0–46.0)
Hemoglobin: 12.1 g/dL (ref 12.0–15.0)
WBC: 7.6 10*3/uL (ref 4.0–10.5)

## 2012-07-01 LAB — POCT I-STAT, CHEM 8
BUN: 13 mg/dL (ref 6–23)
Creatinine, Ser: 0.7 mg/dL (ref 0.50–1.10)
Potassium: 3.7 mEq/L (ref 3.5–5.1)
Sodium: 137 mEq/L (ref 135–145)

## 2012-07-01 MED ORDER — HYDROMORPHONE HCL PF 1 MG/ML IJ SOLN
1.0000 mg | Freq: Once | INTRAMUSCULAR | Status: AC
  Administered 2012-07-01: 1 mg via INTRAMUSCULAR
  Filled 2012-07-01: qty 1

## 2012-07-01 MED ORDER — ONDANSETRON 8 MG PO TBDP
8.0000 mg | ORAL_TABLET | Freq: Once | ORAL | Status: AC
  Administered 2012-07-01: 8 mg via ORAL
  Filled 2012-07-01: qty 1

## 2012-07-01 MED ORDER — PROMETHAZINE HCL 25 MG PO TABS: 25.0000 mg | ORAL_TABLET | Freq: Four times a day (QID) | ORAL | Status: DC | PRN

## 2012-07-01 NOTE — ED Notes (Signed)
Reports was seen in this ED 8 days ago- dx and treated for UTI; reports onset of lower back pain this morning that radiates down bilateral lower extremities; also reports onset of n/v this AM; states last vomited 15 minutes ago.

## 2012-07-01 NOTE — ED Provider Notes (Signed)
History     CSN: [id number]  Arrival date & time 07/01/12  0621   First MD Initiated Contact with Patient 07/01/12 [phone]      Chief Complaint  Patient presents with  . Back Pain  . Emesis    (Consider location/radiation/quality/duration/timing/severity/associated sxs/prior treatment) HPI History provided by patient. Has history of kidney stones and right flank pain. Patient was admitted recently for nausea and flank pain and possible kidney infection. She has been discharged home and finished antibiotics and now feels like her symptoms are returning. No fevers or chills. Vomit once today. No diarrhea. No sick contacts. Pain in her right flank has been unchanged. She is followed by Dr. Felecia Shelling and Dr. Jerre Simon.  Symptoms mild/moderate severity. No known aggravating or alleviating factors.   Past Medical History  Diagnosis Date  . Diabetes mellitus     Controlled by diet  . Cancer  spot on lung last year     Past Surgical History  Procedure Date  . Cesarean section 1994;2000    [place]  . Cholecystectomy 1992  . Sinus sergery 1988    [place]  . Foot surgery March, 2013    [facility]  . Hysteroscopy with thermachoice 04/25/2012    Procedure: HYSTEROSCOPY WITH THERMACHOICE;  Surgeon: Lazaro Arms, MD;  Location: [facility];  Service: Gynecology;  Laterality: N/A;  total therapy time= 11 minutes 42 seconds; 34 ml D5w  in and 34 ml D5w out; temp =87 degrees F  . Cystoscopy w/ ureteral stent placement 05/08/2012    Procedure: CYSTOSCOPY WITH RETROGRADE PYELOGRAM/URETERAL STENT PLACEMENT;  Surgeon: Ky Barban, MD;  Location: [facility];  Service: Urology;  Laterality: Right;    Family History  Problem Relation Age of Onset  . Heart disease    . Cancer    . Diabetes    . Achalasia    . Anesthesia problems Neg Hx   . Hypotension Neg Hx   . Malignant hyperthermia Neg Hx   . Pseudochol deficiency Neg Hx     History  Substance Use Topics  . Smoking status: Never Smoker     . Smokeless tobacco: Not on file  . Alcohol Use: No    OB History    Grav Para Term Preterm Abortions TAB SAB Ect Mult Living                  Review of Systems  Constitutional: Negative for fever and chills.  HENT: Negative for neck pain and neck stiffness.   Eyes: Negative for pain.  Respiratory: Negative for shortness of breath.   Cardiovascular: Negative for chest pain.  Gastrointestinal: Negative for abdominal pain.  Genitourinary: Positive for flank pain. Negative for dysuria and hematuria.  Musculoskeletal: Negative for back pain.  Skin: Negative for rash.  Neurological: Negative for headaches.  All other systems reviewed and are negative.    Allergies  Ciprofloxacin; Penicillins; Codeine; Morphine; Morphine and related; and Sulfonamide derivatives  Home Medications   Current Outpatient Rx  Name Route Sig Dispense Refill  . INSULIN ASPART 100 UNIT/ML [place] SOLN Subcutaneous Inject 15 Units into the skin 3 (three) times daily with meals. 1 vial 5  . INSULIN GLARGINE 100 UNIT/ML [place] SOLN Subcutaneous Inject 40 Units into the skin at bedtime. 10 mL 5  . LEVOFLOXACIN 500 MG PO TABS Oral Take 1 tablet (500 mg total) by mouth daily. 10 tablet 0    BP 126/73  Pulse 86  Temp 97.5 F (36.4 C) (Oral)  Ht 4\' 8"  (1.422 m)  Wt 230 lb (104.327 kg)  BMI 51.56 kg/m2  SpO2 98%  Physical Exam  Constitutional: She is oriented to person, place, and time. She appears well-developed and well-nourished.  HENT:  Head: Normocephalic and atraumatic.  Eyes: Conjunctivae and EOM are normal. Pupils are equal, round, and reactive to light.  Neck: Trachea normal. Neck supple. No thyromegaly present.  Cardiovascular: Normal rate, regular rhythm, S1 normal, S2 normal and normal pulses.     No systolic murmur is present   No diastolic murmur is present  Pulses:      Radial pulses are 2+ on the right side, and 2+ on the left side.  Pulmonary/Chest: Effort normal and breath sounds normal.  She has no wheezes. She has no rhonchi. She has no rales. She exhibits no tenderness.  Abdominal: Soft. Normal appearance and bowel sounds are normal. There is no tenderness. There is no rebound, no guarding, no CVA tenderness and negative Murphy's sign.       Localizes discomfort to right flank without CVA tenderness, rash or any reproducible tenderness abdomen or flank  Musculoskeletal:       BLE:s Calves nontender, no cords or erythema, negative Homans sign  Neurological: She is alert and oriented to person, place, and time. She has normal strength. No cranial nerve deficit or sensory deficit. GCS eye subscore is 4. GCS verbal subscore is 5. GCS motor subscore is 6.  Skin: Skin is warm and dry. No rash noted. She is not diaphoretic.  Psychiatric: Her speech is normal.       Cooperative and appropriate    ED Course  Procedures (including critical care time)  Labs Reviewed  URINALYSIS, ROUTINE W REFLEX MICROSCOPIC - Abnormal; Notable for the following:    Glucose, UA 250 (*)     All other components within normal limits  GLUCOSE, CAPILLARY - Abnormal; Notable for the following:    Glucose-Capillary 202 (*)     All other components within normal limits  POCT I-STAT, CHEM 8 - Abnormal; Notable for the following:    Glucose, Bld 199 (*)     All other components within normal limits  CBC   Results for orders placed during the hospital encounter of 07/01/12  URINALYSIS, ROUTINE W REFLEX MICROSCOPIC      Component Value Range   Color, Urine YELLOW  YELLOW   APPearance CLEAR  CLEAR   Specific Gravity, Urine 1.030  1.005 - 1.030   pH 6.0  5.0 - 8.0   Glucose, UA 250 (*) NEGATIVE mg/dL   Hgb urine dipstick NEGATIVE  NEGATIVE   Bilirubin Urine NEGATIVE  NEGATIVE   Ketones, ur NEGATIVE  NEGATIVE mg/dL   Protein, ur NEGATIVE  NEGATIVE mg/dL   Urobilinogen, UA 0.2  0.0 - 1.0 mg/dL   Nitrite NEGATIVE  NEGATIVE   Leukocytes, UA NEGATIVE  NEGATIVE  CBC      Component Value Range   WBC 7.6   4.0 - 10.5 K/uL   RBC 4.39  3.87 - 5.11 MIL/uL   Hemoglobin 12.1  12.0 - 15.0 g/dL   HCT 96.0  45.4 - 09.8 %   MCV 82.9  78.0 - 100.0 fL   MCH 27.6  26.0 - 34.0 pg   MCHC 33.2  30.0 - 36.0 g/dL   RDW 11.9  14.7 - 82.9 %   Platelets 287  150 - 400 K/uL  GLUCOSE, CAPILLARY      Component Value Range   Glucose-Capillary 202 (*)  70 - 99 mg/dL  POCT I-STAT, CHEM 8      Component Value Range   Sodium 137  135 - 145 mEq/L   Potassium 3.7  3.5 - 5.1 mEq/L   Chloride 101  96 - 112 mEq/L   BUN 13  6 - 23 mg/dL   Creatinine, Ser 6.29  0.50 - 1.10 mg/dL   Glucose, Bld 528 (*) 70 - 99 mg/dL   Calcium, Ion 4.13  2.44 - 1.23 mmol/L   TCO2 23  0 - 100 mmol/L   Hemoglobin 12.6  12.0 - 15.0 g/dL   HCT 01.0  27.2 - 53.6 %    IM dilaudid. Zofran.   Labs and UA as above.   Improved condition, plan f/u [facility] and PCP DR Felecia Shelling.  Well-appearing adult female with some flank pain and vomiting. No UTI. No leukocytosis. Improved condition and old records reviewed. Do not feel imaging is warranted at this time. Plan outpatient followup. Reliable historian verbalizes understanding strict return precautions and agrees to all discharge and followup instructions.  MDM   Nursing notes reviewed. Patient denies any radiating back pain to me. She has no lower extremity deficits on exam.  Gait intact. No red flags to suggest indication for emergent MRI of lumbar spine and has no spine pain or tenderness.    Vital signs reviewed within normal limits. UA and labs reviewed as above. No indication for admission or IV antibiotics at this time.        Sunnie Nielsen, MD 07/02/12 [phone]

## 2012-07-10 ENCOUNTER — Encounter (HOSPITAL_COMMUNITY): Payer: Self-pay | Admitting: *Deleted

## 2012-07-10 ENCOUNTER — Emergency Department (HOSPITAL_COMMUNITY)
Admission: EM | Admit: 2012-07-10 | Discharge: 2012-07-10 | Disposition: A | Discharge status: Home / Self Care | Payer: No Typology Code available for payment source | Attending: Emergency Medicine | Admitting: Emergency Medicine

## 2012-07-10 DIAGNOSIS — Z794 Long term (current) use of insulin: Secondary | ICD-10-CM | POA: Insufficient documentation

## 2012-07-10 DIAGNOSIS — R112 Nausea with vomiting, unspecified: Secondary | ICD-10-CM

## 2012-07-10 DIAGNOSIS — N390 Urinary tract infection, site not specified: Secondary | ICD-10-CM | POA: Insufficient documentation

## 2012-07-10 DIAGNOSIS — Z79899 Other long term (current) drug therapy: Secondary | ICD-10-CM | POA: Insufficient documentation

## 2012-07-10 DIAGNOSIS — R197 Diarrhea, unspecified: Secondary | ICD-10-CM | POA: Insufficient documentation

## 2012-07-10 DIAGNOSIS — E119 Type 2 diabetes mellitus without complications: Secondary | ICD-10-CM | POA: Insufficient documentation

## 2012-07-10 LAB — URINE MICROSCOPIC-ADD ON

## 2012-07-10 LAB — URINALYSIS, ROUTINE W REFLEX MICROSCOPIC
Glucose, UA: >1000 mg/dL — AB
Hgb urine dipstick: NEGATIVE
Specific Gravity, Urine: 1.03 (ref 1.005–1.030)
Urobilinogen, UA: 0.2 mg/dL (ref 0.0–1.0)

## 2012-07-10 LAB — POCT I-STAT, CHEM 8
BUN: 11 mg/dL (ref 6–23)
Creatinine, Ser: 0.7 mg/dL (ref 0.50–1.10)
Hemoglobin: 13.6 g/dL (ref 12.0–15.0)
Potassium: 3.6 mEq/L (ref 3.5–5.1)
Sodium: 138 mEq/L (ref 135–145)

## 2012-07-10 MED ORDER — SODIUM CHLORIDE 0.9 % IV SOLN
INTRAVENOUS | Status: DC
  Administered 2012-07-10: 08:00:00 via INTRAVENOUS

## 2012-07-10 MED ORDER — ONDANSETRON HCL 8 MG PO TABS: 8.0000 mg | ORAL_TABLET | Freq: Three times a day (TID) | ORAL | Status: AC | PRN

## 2012-07-10 MED ORDER — SODIUM CHLORIDE 0.9 % IV BOLUS (SEPSIS)
1000.0000 mL | Freq: Once | INTRAVENOUS | Status: AC
  Administered 2012-07-10: 1000 mL via INTRAVENOUS

## 2012-07-10 NOTE — ED Provider Notes (Signed)
History   This chart was scribed for [name] Melter, MD by Sofie Rower. The patient was seen in room [facility]07/[facility] and the patient's care was started at 7:22 AM     CSN: [id number]  Arrival date & time 07/10/12  7829   First MD Initiated Contact with Patient 07/10/12 [phone]      Chief Complaint  Patient presents with  . Dysuria  . Diarrhea  . Emesis    (Consider location/radiation/quality/duration/timing/severity/associated sxs/prior treatment) Patient is a 46 y.o. female presenting with flank pain. The history is provided by the patient. No language interpreter was used.  Flank Pain This is a new problem. The current episode started yesterday. The problem occurs constantly. The problem has been gradually worsening. Pertinent negatives include no chest pain, no abdominal pain, no headaches and no shortness of breath. The symptoms are aggravated by standing. Nothing relieves the symptoms.    [name] is a 46 y.o. female who presents to the Emergency Department complaining of gradual , progressively worsening dysuria onset yesterday with associated symptoms of right flank pain, bilateral radiating leg pain, vomiting (X 4), diarrhea (X 5, watery) and dizziness. The pt reports she is currently taking levoflexo (antibiotics) for three days (Rx by Dr. Felecia Shelling, seen last Friday, 07/04/12). Modifying factors include sitting down/standing up which intensifies the flank pain. The pt has a hx of kidney stones, UTI, admission to hospital for UTI on 05/07/12 (stayed for four days), and diabetes. The pt denies blood in diarrhea, hx of arthritis, and back pain. The pt does not smoke or drink alcohol.   PCP is Dr. Felecia Shelling. Urologist is Dr. Frann Rider   Past Medical History  Diagnosis Date  . Diabetes mellitus     Controlled by diet  . Cancer  spot on lung last year     Past Surgical History  Procedure Date  . Cesarean section 1994;2000    [place]  . Cholecystectomy 1992  . Sinus sergery 1988   [place]  . Foot surgery March, 2013    [facility]  . Hysteroscopy with thermachoice 04/25/2012    Procedure: HYSTEROSCOPY WITH THERMACHOICE;  Surgeon: Lazaro Arms, MD;  Location: [facility];  Service: Gynecology;  Laterality: N/A;  total therapy time= 11 minutes 42 seconds; 34 ml D5w  in and 34 ml D5w out; temp =87 degrees F  . Cystoscopy w/ ureteral stent placement 05/08/2012    Procedure: CYSTOSCOPY WITH RETROGRADE PYELOGRAM/URETERAL STENT PLACEMENT;  Surgeon: Ky Barban, MD;  Location: [facility];  Service: Urology;  Laterality: Right;    Family History  Problem Relation Age of Onset  . Heart disease    . Cancer    . Diabetes    . Achalasia    . Anesthesia problems Neg Hx   . Hypotension Neg Hx   . Malignant hyperthermia Neg Hx   . Pseudochol deficiency Neg Hx     History  Substance Use Topics  . Smoking status: Never Smoker   . Smokeless tobacco: Not on file  . Alcohol Use: No    OB History    Grav Para Term Preterm Abortions TAB SAB Ect Mult Living                  Review of Systems  Respiratory: Negative for shortness of breath.   Cardiovascular: Negative for chest pain.  Gastrointestinal: Negative for abdominal pain.  Genitourinary: Positive for flank pain.  Neurological: Negative for headaches.  All other systems reviewed and are negative.  Allergies  Ciprofloxacin; Penicillins; Codeine; Morphine; Morphine and related; and Sulfonamide derivatives  Home Medications   Current Outpatient Rx  Name Route Sig Dispense Refill  . CANAGLIFLOZIN 100 MG PO TABS Oral Take 100 mg by mouth daily.    . INSULIN ASPART 100 UNIT/ML [place] SOLN Subcutaneous Inject 20 Units into the skin 3 (three) times daily with meals.    . INSULIN GLARGINE 100 UNIT/ML [place] SOLN Subcutaneous Inject 50 Units into the skin at bedtime.    Marland Kitchen LEVOFLOXACIN 500 MG PO TABS Oral Take 500 mg by mouth daily.    Marland Kitchen METFORMIN HCL 500 MG PO TABS Oral Take 500 mg by mouth 2 (two) times daily with a  meal.    . ONDANSETRON HCL 8 MG PO TABS Oral Take 1 tablet (8 mg total) by mouth every 8 (eight) hours as needed for nausea. 20 tablet 0  . PROMETHAZINE HCL 25 MG PO TABS Oral Take 1 tablet (25 mg total) by mouth every 6 (six) hours as needed for nausea. 30 tablet 0    BP 134/71  Pulse 80  Temp 97.7 F (36.5 C) (Oral)  Resp 20  Ht 4\' 8"  (1.422 m)  Wt 227 lb (102.967 kg)  BMI 50.89 kg/m2  SpO2 98%  Physical Exam  Nursing note and vitals reviewed. Constitutional: She is oriented to person, place, and time. She appears well-developed and well-nourished. No distress.       Pt appears well hydrated.   HENT:  Head: Normocephalic and atraumatic.  Mouth/Throat: Oropharynx is clear and moist.  Eyes: Conjunctivae and EOM are normal. Pupils are equal, round, and reactive to light.  Neck: Neck supple. No tracheal deviation present.  Cardiovascular: Normal rate and regular rhythm.   Pulmonary/Chest: Effort normal and breath sounds normal. No respiratory distress. She exhibits tenderness (Anterior/posterior chest wall pain. ).  Abdominal: Soft. Bowel sounds are normal. She exhibits no distension.  Musculoskeletal: Normal range of motion.  Neurological: She is alert and oriented to person, place, and time. No sensory deficit.  Skin: Skin is dry.  Psychiatric: She has a normal mood and affect. Her behavior is normal.    ED Course  Procedures (including critical care time)   Medications  metFORMIN (GLUCOPHAGE) 500 MG tablet (not administered)  Canagliflozin (INVOKANA) 100 MG TABS (not administered)  insulin glargine (LANTUS) 100 UNIT/ML injection (not administered)  insulin aspart (NOVOLOG) 100 UNIT/ML injection (not administered)  Canagliflozin (INVOKANA) 100 MG TABS (not administered)  ondansetron (ZOFRAN) 8 MG tablet (not administered)  levofloxacin (LEVAQUIN) 500 MG tablet (not administered)  sodium chloride 0.9 % bolus 1,000 mL (1000 mL Intravenous Given 07/10/12 0804)      DIAGNOSTIC STUDIES: Oxygen Saturation is 98% on room air, normal by my interpretation.    COORDINATION OF CARE:  7:29AM- Treatment plan discusses with patient. Patient agrees with treatment plan. Culture ordered, prior UA discussed (last two cultures were staff oreus).   10:08AM- UA results (Culture), High blood sugar discussed. Pt taking Levofloxacin, 500 mg/once per day since 07/07/12. Pain management with tylenol or motrin, Nausea management with Zofran.    Labs Reviewed  URINALYSIS, ROUTINE W REFLEX MICROSCOPIC - Abnormal; Notable for the following:    Glucose, UA >1000 (*)     Leukocytes, UA TRACE (*)     All other components within normal limits  POCT I-STAT, CHEM 8 - Abnormal; Notable for the following:    Glucose, Bld 139 (*)     All other components within normal limits  URINE MICROSCOPIC-ADD ON - Abnormal; Notable for the following:    Squamous Epithelial / LPF FEW (*)     Bacteria, UA MANY (*)     All other components within normal limits  URINE CULTURE      1. UTI (lower urinary tract infection)   2. Nausea vomiting and diarrhea       MDM  Nonspecific, nausea, vomiting, and diarrhea. Doubt pyelonephritis. Doubt metabolic instability, serious bacterial infection or impending vascular collapse; the patient is stable for discharge.   Plan: Home Medications- Zofran, continue levofloxacin as well, antidiarrheal treatments with Pepto, imodium, motrin for pain; Home Treatments- drink plenty of fluids ; Recommended follow up- Primary care provider in one week for urine testing.     I personally performed the services described in this documentation, which was scribed in my presence. The recorded information has been reviewed and considered.     [name] Melter, MD 07/10/12 1606

## 2012-07-10 NOTE — ED Notes (Signed)
Dr.wentz at bedside of pt

## 2012-07-10 NOTE — ED Notes (Signed)
Pt states she has urinary problem, possible uti or stones, reports diarrhea & vomiting.

## 2012-07-11 LAB — URINE CULTURE

## 2012-07-28 ENCOUNTER — Ambulatory Visit: Payer: No Typology Code available for payment source | Admitting: Family Medicine

## 2012-07-28 ENCOUNTER — Encounter: Payer: Self-pay | Admitting: Internal Medicine

## 2012-08-25 ENCOUNTER — Ambulatory Visit (INDEPENDENT_AMBULATORY_CARE_PROVIDER_SITE_OTHER): Payer: No Typology Code available for payment source | Admitting: Family Medicine

## 2012-08-25 ENCOUNTER — Encounter: Payer: Self-pay | Admitting: Family Medicine

## 2012-08-25 VITALS — BP 130/82 | HR 88 | Resp 16 | Ht <= 58 in | Wt 229.0 lb

## 2012-08-25 DIAGNOSIS — IMO0002 Reserved for concepts with insufficient information to code with codable children: Secondary | ICD-10-CM

## 2012-08-25 DIAGNOSIS — I10 Essential (primary) hypertension: Secondary | ICD-10-CM

## 2012-08-25 DIAGNOSIS — IMO0001 Reserved for inherently not codable concepts without codable children: Secondary | ICD-10-CM

## 2012-08-25 DIAGNOSIS — Z23 Encounter for immunization: Secondary | ICD-10-CM

## 2012-08-25 DIAGNOSIS — E785 Hyperlipidemia, unspecified: Secondary | ICD-10-CM

## 2012-08-25 DIAGNOSIS — E119 Type 2 diabetes mellitus without complications: Secondary | ICD-10-CM

## 2012-08-25 DIAGNOSIS — E669 Obesity, unspecified: Secondary | ICD-10-CM

## 2012-08-25 DIAGNOSIS — E1165 Type 2 diabetes mellitus with hyperglycemia: Secondary | ICD-10-CM

## 2012-08-25 NOTE — Patient Instructions (Addendum)
Eye doctor referral Goal to keep blood pressure 130/80  Get the labs done fasting Flu shot given Referral to endocrinology in [place] I will get records  F/U 3 months

## 2012-08-25 NOTE — Assessment & Plan Note (Signed)
Check FLP 

## 2012-08-25 NOTE — Assessment & Plan Note (Signed)
Uncontrolled, appears she has a problem with insulin compliance and testing of blood sugars. Check A1C , she would like to be referred to the same endocrinologist as her husband. Plan to start ACEI, statin pending lab results

## 2012-08-25 NOTE — Assessment & Plan Note (Signed)
Needs to increase activity, watch diet, she does not  Eat very well

## 2012-08-25 NOTE — Progress Notes (Signed)
  Subjective:    Patient ID: [name], female    DOB: [birth year], 46 y.o.   MRN: [id number]  HPI Patient here to establish care. Previous PCP Dr. Felecia Shelling, endocrinologist Dr. Fransico Him. [facility]. Anola Gurney urology [facility] Medications and history reviewed Recurrent kidney stones, history of stent now removed, she has recurrent UTI however many antibiotic allergies Diabetes mellitus- diagnsed 3 years ago, A1C 10.1 in May , pt does not take blood sugars and often forgets to take insulin with meals   Review of Systems  GEN- denies fatigue, fever, weight loss,weakness, recent illness HEENT- denies eye drainage, change in vision, nasal discharge, CVS- denies chest pain, palpitations RESP- denies SOB, cough, wheeze ABD- denies N/V, change in stools, abd pain GU- denies dysuria, hematuria, dribbling, incontinence MSK- denies joint pain, muscle aches, injury Neuro- denies headache, dizziness, syncope, seizure activity      Objective:   Physical Exam GEN- NAD, alert and oriented x3 HEENT- PERRL, EOMI, non injected sclera, pink conjunctiva, MMM, oropharynx clear Neck- Supple,  CVS- RRR, no murmur RESP-CTAB EXT- No edema Pulses- Radial, DP- 2+        Assessment & Plan:

## 2012-08-25 NOTE — Assessment & Plan Note (Signed)
Pt unaware of diagnosis, plan to start ACEI pending lab results

## 2012-08-26 LAB — CBC
HCT: 39.1 % (ref 36.0–46.0)
Hemoglobin: 13.4 g/dL (ref 12.0–15.0)
MCV: 82.1 fL (ref 78.0–100.0)
RBC: 4.76 MIL/uL (ref 3.87–5.11)
WBC: 8.5 10*3/uL (ref 4.0–10.5)

## 2012-08-26 LAB — HEMOGLOBIN A1C
Hgb A1c MFr Bld: 6.8 % — ABNORMAL HIGH (ref <5.7)
Mean Plasma Glucose: 148 mg/dL — ABNORMAL HIGH (ref <117)

## 2012-08-27 ENCOUNTER — Telehealth: Payer: Self-pay | Admitting: Family Medicine

## 2012-08-27 ENCOUNTER — Other Ambulatory Visit: Payer: Self-pay

## 2012-08-27 LAB — COMPREHENSIVE METABOLIC PANEL
ALT: 15 U/L (ref 0–35)
AST: 14 U/L (ref 0–37)
Albumin: 3.8 g/dL (ref 3.5–5.2)
BUN: 9 mg/dL (ref 6–23)
Calcium: 8.5 mg/dL (ref 8.4–10.5)
Chloride: 100 mEq/L (ref 96–112)
Potassium: 4.3 mEq/L (ref 3.5–5.3)
Sodium: 135 mEq/L (ref 135–145)
Total Protein: 6.5 g/dL (ref 6.0–8.3)

## 2012-08-27 LAB — LIPID PANEL: LDL Cholesterol: 105 mg/dL — ABNORMAL HIGH (ref 0–99)

## 2012-08-27 MED ORDER — LISINOPRIL 2.5 MG PO TABS: 2.5000 mg | ORAL_TABLET | Freq: Every day | ORAL | Status: DC

## 2012-08-27 NOTE — Telephone Encounter (Signed)
Any comment you would like to add before I call patient back with results?

## 2012-08-27 NOTE — Addendum Note (Signed)
Addended by: Milinda Antis F on: 08/27/2012 02:47 PM   Modules accepted: Orders

## 2012-08-27 NOTE — Telephone Encounter (Signed)
See lab results.  

## 2012-08-29 ENCOUNTER — Ambulatory Visit: Payer: No Typology Code available for payment source | Admitting: Endocrinology

## 2012-08-29 NOTE — Telephone Encounter (Signed)
Patient aware.

## 2012-09-09 ENCOUNTER — Ambulatory Visit: Payer: No Typology Code available for payment source | Admitting: Endocrinology

## 2012-09-12 ENCOUNTER — Ambulatory Visit (INDEPENDENT_AMBULATORY_CARE_PROVIDER_SITE_OTHER): Payer: No Typology Code available for payment source | Admitting: Family Medicine

## 2012-09-12 ENCOUNTER — Encounter: Payer: Self-pay | Admitting: Family Medicine

## 2012-09-12 VITALS — BP 118/74 | HR 92 | Resp 15 | Ht <= 58 in | Wt 227.0 lb

## 2012-09-12 DIAGNOSIS — B372 Candidiasis of skin and nail: Secondary | ICD-10-CM

## 2012-09-12 MED ORDER — NYSTATIN 100000 UNIT/GM EX POWD: CUTANEOUS | Status: DC

## 2012-09-12 MED ORDER — CLOTRIMAZOLE-BETAMETHASONE 1-0.05 % EX CREA: TOPICAL_CREAM | CUTANEOUS | Status: DC

## 2012-09-12 NOTE — Assessment & Plan Note (Signed)
Candiasis beneath breast , she is diabetic but well controlled, Lotrisone cream BID, then use of nystatin to help absorb moisture and prevent recurrence

## 2012-09-12 NOTE — Progress Notes (Signed)
  Subjective:    Patient ID: YUVAL NOLET, female    DOB: 10/12/1966, 46 y.o.   MRN: [id number]  HPI   Patient presents with rash beneath her bilateral breast for the past 3 weeks. She's had this rash on and off she often uses baby powder however this does not help. She has a lot of itching and irritation beneath her breasts and he can become painful at times. She does sweat a lot.  She has appt with eye doctor on Tuesday  Review of Systems - per above   GEN- denies fatigue, fever, weight loss,weakness, recent illness HEENT- denies eye drainage, change in vision, nasal discharge, CVS- denies chest pain, palpitations RESP- denies SOB, cough, wheeze ABD- denies N/V, change in stools, abd pain      Objective:   Physical Exam GEN- NAD, alert and oriented x3 Skin- erythematous macular rash beneath bilateral breast, moist skin, no pustules, no papules,        Assessment & Plan:

## 2012-09-12 NOTE — Patient Instructions (Addendum)
Use the cream twice a day  Then use the powder  Keep previous f/u appt  Intertrigo Intertrigo is a skin condition that occurs in between folds of skin in places on the body that rub together a lot and do not get much ventilation. It is caused by heat, moisture, friction, sweat retention, and lack of air circulation, which produces red, irritated patches and, sometimes, scaling or drainage. People who have diabetes, who are obese, or who have treatment with antibiotics are at increased risk for intertrigo. The most common sites for intertrigo to occur include:  The groin.   The breasts.   The armpits.   Folds of abdominal skin.   Webbed spaces between the fingers or toes.  Intertrigo may be aggravated by:  Sweat.   Feces.   Yeast or bacteria that are present near skin folds.   Urine.   Vaginal discharge.  HOME CARE INSTRUCTIONS  The following steps can be taken to reduce friction and keep the affected area cool and dry:   Expose skin folds to the air.   Keep deep skin folds separated with cotton or linen cloth. Avoid tight fitting clothing that could cause chafing.   Wear open-toed shoes or sandals to help reduce moisture between the toes.   Apply absorbent powders to affected areas as directed by your caregiver.   Apply over-the-counter barrier pastes, such as zinc oxide, as directed by your caregiver.   If you develop a fungal infection in the affected area, your caregiver may have you use antifungal creams.  SEEK MEDICAL CARE IF:    The rash is not improving after 1 week of treatment.   The rash is getting worse (more red, more swollen, more painful, or spreading).   You have a fever or chills.  MAKE SURE YOU:    Understand these instructions.   Will watch your condition.   Will get help right away if you are not doing well or get worse.  Document Released: 11/26/2005 Document Revised: 02/18/2012 Document Reviewed: 05/11/2010 [facility] Patient Information  2013 [employer] Saenz, [place].

## 2012-09-22 ENCOUNTER — Ambulatory Visit: Payer: No Typology Code available for payment source | Admitting: Endocrinology

## 2012-10-11 ENCOUNTER — Encounter (HOSPITAL_COMMUNITY): Payer: Self-pay | Admitting: *Deleted

## 2012-10-11 ENCOUNTER — Telehealth: Payer: Self-pay | Admitting: Family Medicine

## 2012-10-11 ENCOUNTER — Emergency Department (HOSPITAL_COMMUNITY)
Admission: EM | Admit: 2012-10-11 | Discharge: 2012-10-11 | Disposition: A | Discharge status: Home / Self Care | Payer: BC Managed Care – PPO | Attending: Emergency Medicine | Admitting: Emergency Medicine

## 2012-10-11 DIAGNOSIS — R51 Headache: Secondary | ICD-10-CM | POA: Insufficient documentation

## 2012-10-11 DIAGNOSIS — Z794 Long term (current) use of insulin: Secondary | ICD-10-CM | POA: Insufficient documentation

## 2012-10-11 DIAGNOSIS — R42 Dizziness and giddiness: Secondary | ICD-10-CM | POA: Insufficient documentation

## 2012-10-11 DIAGNOSIS — Z79899 Other long term (current) drug therapy: Secondary | ICD-10-CM | POA: Insufficient documentation

## 2012-10-11 DIAGNOSIS — Z85118 Personal history of other malignant neoplasm of bronchus and lung: Secondary | ICD-10-CM | POA: Insufficient documentation

## 2012-10-11 DIAGNOSIS — R110 Nausea: Secondary | ICD-10-CM | POA: Insufficient documentation

## 2012-10-11 DIAGNOSIS — E669 Obesity, unspecified: Secondary | ICD-10-CM | POA: Insufficient documentation

## 2012-10-11 DIAGNOSIS — H538 Other visual disturbances: Secondary | ICD-10-CM | POA: Insufficient documentation

## 2012-10-11 DIAGNOSIS — R739 Hyperglycemia, unspecified: Secondary | ICD-10-CM

## 2012-10-11 DIAGNOSIS — E119 Type 2 diabetes mellitus without complications: Secondary | ICD-10-CM | POA: Insufficient documentation

## 2012-10-11 LAB — CBC WITH DIFFERENTIAL/PLATELET
Eosinophils Relative: 4 % (ref 0–5)
HCT: 40.1 % (ref 36.0–46.0)
Lymphocytes Relative: 30 % (ref 12–46)
Lymphs Abs: 2 10*3/uL (ref 0.7–4.0)
MCV: 82.7 fL (ref 78.0–100.0)
Monocytes Absolute: 0.5 10*3/uL (ref 0.1–1.0)
Monocytes Relative: 7 % (ref 3–12)
RDW: 13.4 % (ref 11.5–15.5)
WBC: 6.6 10*3/uL (ref 4.0–10.5)

## 2012-10-11 LAB — URINE MICROSCOPIC-ADD ON

## 2012-10-11 LAB — GLUCOSE, CAPILLARY
Glucose-Capillary: 379 mg/dL — ABNORMAL HIGH (ref 70–99)
Glucose-Capillary: 200 mg/dL — ABNORMAL HIGH (ref 70–99)
Glucose-Capillary: 146 mg/dL — ABNORMAL HIGH (ref 70–99)

## 2012-10-11 LAB — BASIC METABOLIC PANEL
BUN: 8 mg/dL (ref 6–23)
CO2: 21 mEq/L (ref 19–32)
Calcium: 8.8 mg/dL (ref 8.4–10.5)
Creatinine, Ser: 0.63 mg/dL (ref 0.50–1.10)
Glucose, Bld: 403 mg/dL — ABNORMAL HIGH (ref 70–99)

## 2012-10-11 LAB — URINALYSIS, ROUTINE W REFLEX MICROSCOPIC
Glucose, UA: >1000 mg/dL — AB
Protein, ur: NEGATIVE mg/dL

## 2012-10-11 MED ORDER — SODIUM CHLORIDE 0.9 % IV SOLN
1000.0000 mL | Freq: Once | INTRAVENOUS | Status: AC
  Administered 2012-10-11: 1000 mL via INTRAVENOUS

## 2012-10-11 MED ORDER — HYDROMORPHONE HCL PF 1 MG/ML IJ SOLN
1.0000 mg | Freq: Once | INTRAMUSCULAR | Status: AC
  Administered 2012-10-11: 1 mg via INTRAVENOUS
  Filled 2012-10-11: qty 1

## 2012-10-11 MED ORDER — SODIUM CHLORIDE 0.9 % IV SOLN
1000.0000 mL | Freq: Once | INTRAVENOUS | Status: AC
Start: 2012-10-11 — End: 2012-10-11
  Administered 2012-10-11: 1000 mL via INTRAVENOUS

## 2012-10-11 MED ORDER — SODIUM CHLORIDE 0.9 % IV SOLN
Freq: Once | INTRAVENOUS | Status: AC
  Administered 2012-10-11: 09:00:00 via INTRAVENOUS

## 2012-10-11 MED ORDER — SODIUM CHLORIDE 0.9 % IV SOLN
INTRAVENOUS | Status: DC
  Administered 2012-10-11: 2.3 [IU]/h via INTRAVENOUS
  Filled 2012-10-11: qty 1

## 2012-10-11 MED ORDER — ONDANSETRON HCL 4 MG/2ML IJ SOLN
4.0000 mg | Freq: Once | INTRAMUSCULAR | Status: AC
  Administered 2012-10-11: 4 mg via INTRAVENOUS
  Filled 2012-10-11: qty 2

## 2012-10-11 MED ORDER — SODIUM CHLORIDE 0.9 % IV SOLN: 1000.0000 mL | INTRAVENOUS | Status: DC

## 2012-10-11 NOTE — ED Provider Notes (Signed)
History  This chart was scribed for [name] Cooper III, MD by [name]. This patient was seen in room APA12/APA12 and the patient's care was started at 8:42.   CSN: [id number]  Arrival date & time 10/11/12  4098   First MD Initiated Contact with Patient 10/11/12 [phone]      Chief Complaint  Patient presents with  . Hyperglycemia    (Consider location/radiation/quality/duration/timing/severity/associated sxs/prior treatment) The history is provided by the patient. No language interpreter was used.  [name] is a 46 y.o. female who presents to the Emergency Department complaining of hyperglycemia. Pt was diagnosed with DM in June, when her blood sugar was so high and she had to be hospitalized for about a week. Pt reports this week her blood sugar has been high (staying around 200) with an associated headache and blurred vision. Upon waking this morning she experienced dizziness with some associated nausea and bilateral leg pain, described as "like someone is twisting them." Pt noticed her blood sugar was 198 this morning so she took her insulin, with no relief from symptoms. She checked it two more times and it continued to rise to 200 then to 340 so she called her PCP ([name]); [name] who was on call answered and recommended the pt come in to the ED. Pt's blood glucose level was 379 upon arrival. Pt ordinarily takes Metformin (500 mg, x2/day), Invokana (100mg  x1/day), 3 shots of novolog (15 units) before eating, and 1 shot of lantis (40 units) at night. Pt reports a surgical h/o 2 caesarian sections, a cholecystectomy, sinus surgery, foot surgery, and a hysteroscopy. Pt is allergic to ciprofloxacin, penicillins, codeine, morphine, and sulfonamide derivatives. Pt reports a strong family h/o DM but denies any personal smoking or drinking.  Past Medical History  Diagnosis Date  . Diabetes mellitus     Controlled by diet  . Cancer  spot on lung last year     Past Surgical History   Procedure Date  . Cesarean section 1994;2000    [place]  . Cholecystectomy 1992  . Sinus sergery 1988    [place]  . Foot surgery March, 2013    [facility]  . Hysteroscopy with thermachoice 04/25/2012    Procedure: HYSTEROSCOPY WITH THERMACHOICE;  Surgeon: [name] Arms, MD;  Location: [facility];  Service: Gynecology;  Laterality: N/A;  total therapy time= 11 minutes 42 seconds; 34 ml D5w  in and 34 ml D5w out; temp =87 degrees F  . Cystoscopy w/ ureteral stent placement 05/08/2012    Procedure: CYSTOSCOPY WITH RETROGRADE PYELOGRAM/URETERAL STENT PLACEMENT;  Surgeon: [name] Barban, MD;  Location: [facility];  Service: Urology;  Laterality: Right;    Family History  Problem Relation Age of Onset  . Heart disease    . Cancer    . Diabetes    . Achalasia    . Anesthesia problems Neg Hx   . Hypotension Neg Hx   . Malignant hyperthermia Neg Hx   . Pseudochol deficiency Neg Hx     History  Substance Use Topics  . Smoking status: Never Smoker   . Smokeless tobacco: Not on file  . Alcohol Use: No    OB History    Grav Para Term Preterm Abortions TAB SAB Ect Mult Living                  Review of Systems  Constitutional: Positive for chills. Negative for fever.  Eyes: Positive for visual disturbance.  Respiratory: Negative  for shortness of breath.   Gastrointestinal: Positive for nausea. Negative for vomiting and abdominal pain.  Musculoskeletal:       Bilateral leg pain  Neurological: Positive for dizziness and headaches. Negative for weakness.  All other systems reviewed and are negative.    Allergies  Ciprofloxacin; Penicillins; Codeine; Morphine; Morphine and related; and Sulfonamide derivatives  Home Medications   Current Outpatient Rx  Name Route Sig Dispense Refill  . CANAGLIFLOZIN 100 MG PO TABS Oral Take 100 mg by mouth daily.    Marland Kitchen CLOTRIMAZOLE-BETAMETHASONE 1-0.05 % EX CREA  Apply to affected area 2 times daily 45 g 2  . INSULIN ASPART 100 UNIT/ML [place]  SOLN Subcutaneous Inject 15 Units into the skin 3 (three) times daily with meals.     . INSULIN GLARGINE 100 UNIT/ML [place] SOLN Subcutaneous Inject 40 Units into the skin at bedtime.     Marland Kitchen LISINOPRIL 2.5 MG PO TABS Oral Take 1 tablet (2.5 mg total) by mouth daily. 30 tablet 3  . METFORMIN HCL 500 MG PO TABS Oral Take 500 mg by mouth 2 (two) times daily with a meal.    . NYSTATIN 100000 UNIT/GM EX POWD  Apply to affected area 3 times daily 60 g 3  . PROMETHAZINE HCL 25 MG PO TABS Oral Take 1 tablet (25 mg total) by mouth every 6 (six) hours as needed for nausea. 30 tablet 0    Triage Vitals: BP 152/82  Pulse 87  Temp 98.4 F (36.9 C) (Oral)  Resp 20  Ht 4\' 9"  (1.448 m)  SpO2 100%  Physical Exam  Nursing note and vitals reviewed. Constitutional: She is oriented to person, place, and time. She appears well-developed. No distress.       Short and obese  HENT:  Head: Normocephalic and atraumatic.  Eyes: EOM are normal. Pupils are equal, round, and reactive to light.  Neck: Neck supple. No tracheal deviation present.  Cardiovascular: Normal rate and normal heart sounds.   Pulmonary/Chest: Effort normal. No respiratory distress.  Abdominal: Soft. She exhibits no distension.  Musculoskeletal: Normal range of motion. She exhibits no edema.  Neurological: She is alert and oriented to person, place, and time.  Skin: Skin is warm and dry.  Psychiatric: She has a normal mood and affect.    ED Course  Procedures (including critical care time) DIAGNOSTIC STUDIES: Oxygen Saturation is 99% on room air, normal by my interpretation.    COORDINATION OF CARE: 08:45--I evaluated the patient and we discussed a treatment plan including pain medication for her headache (Dilaudid), Zofran, IV fluids, blood work, and urinalysis to which the pt agreed.   9:47--I reviewed the pt's lab results and rechecked the pt. I informed her that her blood glucose was 403 and she shows high amounts of glucose in her  urine. I discussed the treatment plan with her and told her she might have to be admitted if her levels do not come down.  11:57--I rechecked the pt and discussed with her the new blood glucose levels, at 200 now. I told the pt we would check her levels one more time and if it is not raised, we will let her go home. Pt asked about being put on a sliding scale medication and I told her I would consult with her physician.   Results for orders placed during the hospital encounter of 10/11/12  GLUCOSE, CAPILLARY      Component Value Range   Glucose-Capillary 379 (*) 70 - 99 mg/dL  Comment 1 Notify RN     Comment 2 Documented in Chart    URINALYSIS, ROUTINE W REFLEX MICROSCOPIC      Component Value Range   Color, Urine YELLOW  YELLOW   APPearance CLEAR  CLEAR   Specific Gravity, Urine <1.005 (*) 1.005 - 1.030   pH 6.0  5.0 - 8.0   Glucose, UA >1000 (*) NEGATIVE mg/dL   Hgb urine dipstick SMALL (*) NEGATIVE   Bilirubin Urine NEGATIVE  NEGATIVE   Ketones, ur NEGATIVE  NEGATIVE mg/dL   Protein, ur NEGATIVE  NEGATIVE mg/dL   Urobilinogen, UA 0.2  0.0 - 1.0 mg/dL   Nitrite NEGATIVE  NEGATIVE   Leukocytes, UA NEGATIVE  NEGATIVE  CBC WITH DIFFERENTIAL      Component Value Range   WBC 6.6  4.0 - 10.5 K/uL   RBC 4.85  3.87 - 5.11 MIL/uL   Hemoglobin 13.6  12.0 - 15.0 g/dL   HCT 16.1  09.6 - 04.5 %   MCV 82.7  78.0 - 100.0 fL   MCH 28.0  26.0 - 34.0 pg   MCHC 33.9  30.0 - 36.0 g/dL   RDW 40.9  81.1 - 91.4 %   Platelets 294  150 - 400 K/uL   Neutrophils Relative 59  43 - 77 %   Neutro Abs 3.9  1.7 - 7.7 K/uL   Lymphocytes Relative 30  12 - 46 %   Lymphs Abs 2.0  0.7 - 4.0 K/uL   Monocytes Relative 7  3 - 12 %   Monocytes Absolute 0.5  0.1 - 1.0 K/uL   Eosinophils Relative 4  0 - 5 %   Eosinophils Absolute 0.3  0.0 - 0.7 K/uL   Basophils Relative 0  0 - 1 %   Basophils Absolute 0.0  0.0 - 0.1 K/uL  BASIC METABOLIC PANEL      Component Value Range   Sodium 133 (*) 135 - 145 mEq/L    Potassium 3.4 (*) 3.5 - 5.1 mEq/L   Chloride 101  96 - 112 mEq/L   CO2 21  19 - 32 mEq/L   Glucose, Bld 403 (*) 70 - 99 mg/dL   BUN 8  6 - 23 mg/dL   Creatinine, Ser 7.82  0.50 - 1.10 mg/dL   Calcium 8.8  8.4 - 95.6 mg/dL   GFR calc non Af Amer >90  >90 mL/min   GFR calc Af Amer >90  >90 mL/min  URINE MICROSCOPIC-ADD ON      Component Value Range   Squamous Epithelial / LPF FEW (*) RARE   WBC, UA 3-6  <3 WBC/hpf   RBC / HPF 3-6  <3 RBC/hpf   Bacteria, UA FEW (*) RARE   9:41 AM  Glucose elevated at 403.  CO2 21, not in DKA.  Will place on glucostabilizer.  Results for orders placed during the hospital encounter of 10/11/12  GLUCOSE, CAPILLARY      Component Value Range   Glucose-Capillary 379 (*) 70 - 99 mg/dL   Comment 1 Notify RN     Comment 2 Documented in Chart    URINALYSIS, ROUTINE W REFLEX MICROSCOPIC      Component Value Range   Color, Urine YELLOW  YELLOW   APPearance CLEAR  CLEAR   Specific Gravity, Urine <1.005 (*) 1.005 - 1.030   pH 6.0  5.0 - 8.0   Glucose, UA >1000 (*) NEGATIVE mg/dL   Hgb urine dipstick SMALL (*) NEGATIVE   Bilirubin  Urine NEGATIVE  NEGATIVE   Ketones, ur NEGATIVE  NEGATIVE mg/dL   Protein, ur NEGATIVE  NEGATIVE mg/dL   Urobilinogen, UA 0.2  0.0 - 1.0 mg/dL   Nitrite NEGATIVE  NEGATIVE   Leukocytes, UA NEGATIVE  NEGATIVE  CBC WITH DIFFERENTIAL      Component Value Range   WBC 6.6  4.0 - 10.5 K/uL   RBC 4.85  3.87 - 5.11 MIL/uL   Hemoglobin 13.6  12.0 - 15.0 g/dL   HCT 16.1  09.6 - 04.5 %   MCV 82.7  78.0 - 100.0 fL   MCH 28.0  26.0 - 34.0 pg   MCHC 33.9  30.0 - 36.0 g/dL   RDW 40.9  81.1 - 91.4 %   Platelets 294  150 - 400 K/uL   Neutrophils Relative 59  43 - 77 %   Neutro Abs 3.9  1.7 - 7.7 K/uL   Lymphocytes Relative 30  12 - 46 %   Lymphs Abs 2.0  0.7 - 4.0 K/uL   Monocytes Relative 7  3 - 12 %   Monocytes Absolute 0.5  0.1 - 1.0 K/uL   Eosinophils Relative 4  0 - 5 %   Eosinophils Absolute 0.3  0.0 - 0.7 K/uL   Basophils  Relative 0  0 - 1 %   Basophils Absolute 0.0  0.0 - 0.1 K/uL  BASIC METABOLIC PANEL      Component Value Range   Sodium 133 (*) 135 - 145 mEq/L   Potassium 3.4 (*) 3.5 - 5.1 mEq/L   Chloride 101  96 - 112 mEq/L   CO2 21  19 - 32 mEq/L   Glucose, Bld 403 (*) 70 - 99 mg/dL   BUN 8  6 - 23 mg/dL   Creatinine, Ser 7.82  0.50 - 1.10 mg/dL   Calcium 8.8  8.4 - 95.6 mg/dL   GFR calc non Af Amer >90  >90 mL/min   GFR calc Af Amer >90  >90 mL/min  URINE MICROSCOPIC-ADD ON      Component Value Range   Squamous Epithelial / LPF FEW (*) RARE   WBC, UA 3-6  <3 WBC/hpf   RBC / HPF 3-6  <3 RBC/hpf   Bacteria, UA FEW (*) RARE  GLUCOSE, CAPILLARY      Component Value Range   Glucose-Capillary 292 (*) 70 - 99 mg/dL  GLUCOSE, CAPILLARY      Component Value Range   Glucose-Capillary 200 (*) 70 - 99 mg/dL  GLUCOSE, CAPILLARY      Component Value Range   Glucose-Capillary 146 (*) 70 - 99 mg/dL   Blood sugar has come down nicely.  Case discussed with Dr. Syliva Overman, who recommends that pt continue on her same insulin regimen, and that pt should be seen by [name] on Monday, to discuss whether or not she should go on a sliding scale of insulin dosage.   1. Hyperglycemia    I personally performed the services described in this documentation, which was scribed in my presence. The recorded information has been reviewed and considered.  Osvaldo Human, MD       [name] Cooper III, MD 10/11/12 [phone]

## 2012-10-11 NOTE — ED Notes (Signed)
Pt receiving Bolus. Waiting for Insulin drip from pharmacy.

## 2012-10-11 NOTE — Telephone Encounter (Signed)
Pt called at 7:30am concerned about fasting blood sugar being over 200 and unable to get it down , as well as chills and light headedness. I advised ED eval based on symptoms rather than blood sugar  ED Doc called around 12:25 blood suagr in ED was 400 no dKA, no sign of infection, being discharged, pt asking about change in med dose, however will defer to PCP who she needs a f/u with next week , she is to call in. Of note, HBa1C in Sept is good, Pt states she has pending appt with [place] [facility]

## 2012-10-11 NOTE — ED Notes (Signed)
Pt c/o elevated blood sugar, states that she has noticed her blood sugar being elevated all week with readings of 200's ,pt states that this am when she first got up she was dizzy, fell, bsl 190, took 15 units of regular insulin, repeat bsl was over 200, pt spoke with Dr. Lodema Hong when bsl elevated to 340, was advised to come to er, denies any injury from fall.  Pt c/o elevated blood sugar, headache, chills.

## 2012-10-12 LAB — URINE CULTURE

## 2012-10-13 NOTE — Telephone Encounter (Signed)
Pt has appt tomorromow

## 2012-10-14 ENCOUNTER — Ambulatory Visit (INDEPENDENT_AMBULATORY_CARE_PROVIDER_SITE_OTHER): Payer: No Typology Code available for payment source | Admitting: Family Medicine

## 2012-10-14 ENCOUNTER — Encounter: Payer: Self-pay | Admitting: Family Medicine

## 2012-10-14 VITALS — BP 124/78 | HR 102 | Resp 18 | Ht <= 58 in | Wt 228.0 lb

## 2012-10-14 DIAGNOSIS — G629 Polyneuropathy, unspecified: Secondary | ICD-10-CM | POA: Insufficient documentation

## 2012-10-14 DIAGNOSIS — IMO0002 Reserved for concepts with insufficient information to code with codable children: Secondary | ICD-10-CM

## 2012-10-14 DIAGNOSIS — E1165 Type 2 diabetes mellitus with hyperglycemia: Secondary | ICD-10-CM

## 2012-10-14 DIAGNOSIS — E1140 Type 2 diabetes mellitus with diabetic neuropathy, unspecified: Secondary | ICD-10-CM

## 2012-10-14 DIAGNOSIS — IMO0001 Reserved for inherently not codable concepts without codable children: Secondary | ICD-10-CM

## 2012-10-14 DIAGNOSIS — R519 Headache, unspecified: Secondary | ICD-10-CM | POA: Insufficient documentation

## 2012-10-14 DIAGNOSIS — G589 Mononeuropathy, unspecified: Secondary | ICD-10-CM

## 2012-10-14 DIAGNOSIS — R51 Headache: Secondary | ICD-10-CM

## 2012-10-14 MED ORDER — GABAPENTIN 300 MG PO CAPS: 300.0000 mg | ORAL_CAPSULE | Freq: Three times a day (TID) | ORAL | Status: DC

## 2012-10-14 MED ORDER — KETOROLAC TROMETHAMINE 60 MG/2ML IJ SOLN
60.0000 mg | Freq: Once | INTRAMUSCULAR | Status: AC
  Administered 2012-10-14: 60 mg via INTRAMUSCULAR

## 2012-10-14 MED ORDER — LISINOPRIL 2.5 MG PO TABS: 2.5000 mg | ORAL_TABLET | Freq: Every day | ORAL | Status: AC

## 2012-10-14 NOTE — Progress Notes (Signed)
  Subjective:    Patient ID: [name], female    DOB: [birth year], 46 y.o.   MRN: [id number]  HPI Patient seen in emergency room secondary to hyperglycemia on Saturday. Friday night she will is not feeling well. She checked her blood sugar it went from 192 201. She took her insulin including her Lantus and her NovoLog 15 units after eating a bowl of Cheerios and her blood sugar went up to 240. She felt like she was going to faint therefore she was taken to the emergency room where her blood sugar was found to be in the upper 300s. She was given IV fluids and some insulin and her sugar returned to normal. Her last A1c was 6.8% 6 weeks ago. She has an appointment with endocrinology pending for this week. She states she's been feeling bad with headaches over the past few weeks. She also cut out caffeine during this time. She's also noted she's always fatigue and has muscle aches tingling and numbness in her feet for the past few months. Her to libido has also been low. She's been taking her medications as prescribed. She is asking to go on an insulin pump to help better control her diabetes   Review of Systems  GEN- denies fatigue, fever, weight loss,weakness, recent illness HEENT- denies eye drainage, change in vision, nasal discharge, CVS- denies chest pain, palpitations RESP- denies SOB, cough, wheeze ABD- denies N/V, change in stools, abd pain GU- denies dysuria, hematuria, dribbling, incontinence MSK- denies joint pain, muscle aches, injury Neuro- + headache, dizziness, syncope, seizure activity      Objective:   Physical Exam GEN- NAD, alert and oriented x3 HEENT- PERRL, EOMI, non injected sclera, pink conjunctiva, MMM, oropharynx clear, fundus normal Neck- Supple, no LAD CVS- RRR, no murmur RESP-CTAB ABS-NABS,soft,NT,ND EXT- No edema, legs NT to touch Pulses- Radial, DP- 2+ Neuro- No focal deficits, CNII-XII in tact        Assessment & Plan:

## 2012-10-14 NOTE — Assessment & Plan Note (Signed)
Likely multifactorial in setting of fatigue she is currently in school with late night classes coming off caffeine and her fluctuating blood sugars. I've given her a shot of Toradol in the office today. She can continue over-the-counter medications I believe getting her blood sugars and things under control will improve her headaches. She also completes school in 2 weeks

## 2012-10-14 NOTE — Addendum Note (Signed)
Addended by: Kandis Fantasia B on: 10/14/2012 04:43 PM   Modules accepted: Orders

## 2012-10-14 NOTE — Assessment & Plan Note (Signed)
Will start treatment for neuropathy based on pt symptoms and history of DM, start gabapentin at bedtime

## 2012-10-14 NOTE — Assessment & Plan Note (Addendum)
Her blood sugar fluctuates up and down despite her A1c been 6.8% 6 weeks ago. I will have her see the endocrinologist. Her fasting have been running in the 180s. I will increase her Lantus to 50 units and she will continue NovoLog 15 units at bedtime along with the Invokana, and metformin. I have reset her ACE inhibitor She can discuss insulin pump with endocrinologist

## 2012-10-14 NOTE — Assessment & Plan Note (Signed)
We'll treat for diabetic neuropathy in her legs and feet. Will start her on gabapentin 300 mg at bedtime

## 2012-10-14 NOTE — Patient Instructions (Addendum)
Start the gabapentin at bedtime Increase lantus to 50 units at bedtime  Stay with the 15 units with meals  Continue metformin and Invokana Lisinopril 2.5mg  for your kidneys Try the extra strength tylenol Keep drinking water at least 4 cups  Keep previous f/u appt

## 2012-10-16 ENCOUNTER — Encounter: Payer: Self-pay | Admitting: Endocrinology

## 2012-10-16 ENCOUNTER — Ambulatory Visit (INDEPENDENT_AMBULATORY_CARE_PROVIDER_SITE_OTHER): Payer: No Typology Code available for payment source | Admitting: Internal Medicine

## 2012-10-16 ENCOUNTER — Telehealth: Payer: Self-pay | Admitting: Endocrinology

## 2012-10-16 VITALS — BP 128/74 | HR 78 | Temp 98.8°F | Resp 12 | Wt 228.0 lb

## 2012-10-16 DIAGNOSIS — G8929 Other chronic pain: Secondary | ICD-10-CM

## 2012-10-16 DIAGNOSIS — E1165 Type 2 diabetes mellitus with hyperglycemia: Secondary | ICD-10-CM

## 2012-10-16 DIAGNOSIS — R079 Chest pain, unspecified: Secondary | ICD-10-CM

## 2012-10-16 DIAGNOSIS — E118 Type 2 diabetes mellitus with unspecified complications: Secondary | ICD-10-CM

## 2012-10-16 DIAGNOSIS — IMO0002 Reserved for concepts with insufficient information to code with codable children: Secondary | ICD-10-CM

## 2012-10-16 MED ORDER — INSULIN GLARGINE 100 UNIT/ML ~~LOC~~ SOLN: 50.0000 [IU] | Freq: Every day | SUBCUTANEOUS | Status: DC

## 2012-10-16 MED ORDER — INSULIN ASPART 100 UNIT/ML ~~LOC~~ SOLN: 15.0000 [IU] | Freq: Three times a day (TID) | SUBCUTANEOUS | Status: DC

## 2012-10-16 NOTE — Telephone Encounter (Signed)
Pt advised that [facility] at [facility] in [place] never received the referrral Dr. Elvera Lennox was suppose to send. Please advise.

## 2012-10-16 NOTE — Progress Notes (Signed)
Subjective:     Patient ID: [name], female   DOB: [birth year], 46 y.o.   MRN: [id number]  HPI Pt. Is referred by PCP, Dr. Milinda Antis, MD, for management of DM2.  Pt. was diagnosed with DM2 this Spring when she went to the hospital [facility]) for frequent kidney stones and pyelonephritis. Incidentally, her sugar was in the 500s. At that time, she was in and out of the hospital for ~1 month. She was discharged on Lantus and Novolog. This past Saturday she felt dizzy and checked her sugar (201) and kept increasing. She went again to [facility] (sugars 403). She was hydrated and given insulin iv. Two days ago she went and saw her PCP, and her Lantus was increased from 40 to 50 units qhs and the Novolog kept at 15 units for all three meals. She injects in her abdomen but sometimes in the arm or leg. She gets confused a lot and forgets taking the insulin a lot (at least 3/7 times a week she forgets Lantus, for e.g.). She has both Lantus and Novolog pens and syringes, but only used the syringes. She has used both Lantus and Novolog bottles for more than a month (!), did not know that they expire 1 month after opening.  She is on Invokana since 07/25. She cannot remember if she started it before or after the last pyelonephritis episode. She also takes Metformin 500 mg bid.   Last HbA1C: Lab Results  Component Value Date   HGBA1C 6.8* 08/26/2012  No previous microalbumin/creatinine ratio checked. Last foot exam was 2 days ago with Dr. Jeanice Lim.  She checks her sugars 3x a day: - am: 160-200, max. 201 - pre-lunch: 158-190 - pre-dinner: 160-180, max. 235 No lows. She has sweating, headaches, hunger, but with high sugars, not lows.   No diabetic nephropathy. She has neuropathy, for which she was started on Neurontin qhs by Dr. Jeanice Lim. Last eye exam was 2 years ago.   She tells me she is interested in an insulin pump as she does not feel that injected insulin is helping her normalize her  sugars.  She also c/o chest pressure, chronic , >1 year, previous EKGs reviewed per Epic records and are normal. Pain can be as intense as 8/10, central chest, sometimes radiating to left arm/shoulder. It is exacerbated by "stress", not exertion. Not influenced by taking a deep breath in. She has SOB with exertion, too, sometimes when going up 1 flight of stairs.   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 Takes Megace as needed for bleeding.  Review of Systems  Constitutional: Negative for fever, chills, fatigue and unexpected weight change.       Overweight  HENT: Negative for congestion and trouble swallowing.   Eyes: Negative for visual disturbance.  Respiratory: Positive for chest tightness and shortness of breath. Negative for cough.   Cardiovascular: Positive for chest pain. Negative for palpitations and leg swelling.  Gastrointestinal: Negative for nausea, vomiting, abdominal pain, diarrhea  and constipation.  Genitourinary: Positive for menstrual problem (heavy menstrual bleeding - occasionally, only, after endometrial ablation). Negative for dysuria.       Frequent urination, occasional incontinence  Musculoskeletal: Negative for myalgias and arthralgias.  Skin: Negative for rash.  Neurological: Positive for dizziness and headaches. Negative for weakness and light-headedness.  Psychiatric/Behavioral: Negative for dysphoric mood. The patient is not nervous/anxious.   All other systems reviewed and are negative.  Also see below: She stays thirsty all the time and has urinary  frequency, many times at night.  Denies palpitations, sometimes SOB and gets some pressure (dull pain) in her chest - sometimes she gets it with neck pain and HA. It is in the center of her chest and had no relationship with inspiration.     Objective:   Physical Exam  Nursing note and vitals reviewed. Constitutional: She is oriented to person, place, and time.  Eyes: EOM are normal. Pupils are equal, round, and reactive to light.  Neck: Neck supple. No thyromegaly present.  Cardiovascular: Normal rate and regular rhythm.  Exam reveals no gallop and no friction rub.   No murmur heard. Pulmonary/Chest: Effort normal and breath sounds normal.  Abdominal: Soft. Bowel sounds are normal. There is no tenderness.  Musculoskeletal: She exhibits no edema.  Lymphadenopathy:    She has no cervical adenopathy.  Neurological: She is alert and oriented to person, place, and time. She displays normal reflexes.  Skin: Skin is warm and dry. No rash noted.  Psychiatric: She has a normal mood and affect.   BP 128/74  Pulse 78  Temp 98.8 F (37.1 C) (Oral)  Resp 12  Wt 228 lb (103.42 kg)  SpO2 98%  LMP 07/16/2012 Wt Readings from Last 3 Encounters:  10/16/12 228 lb (103.42 kg)  10/14/12 228 lb 0.6 oz (103.438 kg)  10/11/12 228 lb 4 oz (103.534 kg)      Assessment:     1. DM2 - insulin dependent, uncontrolled 2. Chest pressure, chronic    Plan:     1. DM2 - uncontrolled, new dg 6 months ago - will refer to diabetic education for nutritional advice and further diabetes education - will d/c Invokana since she has repeated kidney infection - given her a sugar log and explained how she should take her sugar and her goals - advised how to mark whether she takes insulin so that she does not get confused and misses doses, as this is a key factor in diabetes control - for now we will keep the same insulin doses - will expand the regimen to include SSI in the future - I explained that I believe it  is premature to talk about an insulin pump for now, since she did not "fail" basal-bolus injection and we need to work more on compliance - continue Lisinopril - discussed correct foot care - given our clinic telephone nr and advised her to call with any question or concern - advised her to join MyChart - pt will make an appt with Dr Nile Riggs (ophthalmology, [facility]) through PCP per her preferrence - I will see her back in a month and will check a HbA1C and a microalbumin/creatinine ratio then; no labs for today. 2. Chronic chest pressure and SOB with exertion - pt. describes central chest pressure, sometimes radiating to left shoulder and posterior neck, occasionally with HAs - EKG's normal in the past. Will refer to cardiology office in [place], per pt's preference.

## 2012-10-16 NOTE — Telephone Encounter (Signed)
Pt advised that [name] at [facility] in [place] never received the referral Dr. Elvera Lennox was supposed to send. Can we check on it?

## 2012-10-16 NOTE — Patient Instructions (Addendum)
Please stop your Invokana Please fill out your sugar log Try to take the insulin without missing doses Keep the same insulin doses for now I gave you refills on your Lantus and Novolog I referred you to diabetes education and nutrition I referred you to cardiology for your chest pressure and shortness of breath We will check your HbA1C at your next visit, in a month

## 2012-10-17 ENCOUNTER — Ambulatory Visit: Payer: No Typology Code available for payment source | Admitting: Endocrinology

## 2012-10-17 NOTE — Telephone Encounter (Signed)
[name], I talked to Dr. Everardo All - it may take few days for them to get the referral. Can you please call their office on Monday or Tuesday to see if they received it, and, if not, I will re-order it. Thank you!

## 2012-10-20 ENCOUNTER — Telehealth: Payer: Self-pay | Admitting: Internal Medicine

## 2012-10-20 NOTE — Telephone Encounter (Signed)
Thank you, [name], that is great!

## 2012-10-20 NOTE — Telephone Encounter (Signed)
[name], can you please call and tell them pt is diabetic and having chronic chest pain (several ED visits, with normal EKGs) and SOB with exertion. I would like her to see a cardiology in the office rather than in ED. Thank you!

## 2012-10-20 NOTE — Telephone Encounter (Signed)
Patient states that she called [facility] in [place] and they told her that they have not received referral from Dr. Truddie Hidden yet. Patient wants to know how long it will take for referral to be completed.

## 2012-10-20 NOTE — Telephone Encounter (Signed)
Returned pt call. Pt states that they never received a referral. [facility] in  told pt to ask Dr. Charlean Sanfilippo office to call for the referral. #[phone]. Please advise.

## 2012-10-20 NOTE — Telephone Encounter (Signed)
Left a detailed message on pt's phone with name of provider, date and time.

## 2012-10-20 NOTE — Telephone Encounter (Signed)
Called [facility] in [place] to make referral. Appt scheduled Dec. 5th with Dr. Tenny Craw. If they have a cancellation, they will call pt.

## 2012-11-04 ENCOUNTER — Telehealth (HOSPITAL_COMMUNITY): Payer: Self-pay | Admitting: Dietician

## 2012-11-04 NOTE — Telephone Encounter (Signed)
Unable to reach by telephone. Message reports number is disconnected or no longer in service. Sent letter to pt home via [place] Mail in attempt to contact pt to schedule appointment.

## 2012-11-04 NOTE — Telephone Encounter (Signed)
Received referral via [facility] (Epic) Inbox message from [facility] for dx: diabetes

## 2012-11-10 ENCOUNTER — Encounter: Payer: Self-pay | Admitting: *Deleted

## 2012-11-10 NOTE — Telephone Encounter (Signed)
Pt did not respond to contact attempt. Sent letter to pt home via [place] Mail in attempt to contact pt to schedule appointment.

## 2012-11-12 ENCOUNTER — Encounter: Payer: Self-pay | Admitting: Cardiovascular Disease

## 2012-11-12 ENCOUNTER — Ambulatory Visit (INDEPENDENT_AMBULATORY_CARE_PROVIDER_SITE_OTHER): Payer: No Typology Code available for payment source | Admitting: Cardiovascular Disease

## 2012-11-12 VITALS — BP 128/80 | HR 78 | Wt 229.0 lb

## 2012-11-12 DIAGNOSIS — E785 Hyperlipidemia, unspecified: Secondary | ICD-10-CM

## 2012-11-12 DIAGNOSIS — I10 Essential (primary) hypertension: Secondary | ICD-10-CM

## 2012-11-12 DIAGNOSIS — IMO0002 Reserved for concepts with insufficient information to code with codable children: Secondary | ICD-10-CM

## 2012-11-12 DIAGNOSIS — IMO0001 Reserved for inherently not codable concepts without codable children: Secondary | ICD-10-CM

## 2012-11-12 DIAGNOSIS — R0600 Dyspnea, unspecified: Secondary | ICD-10-CM | POA: Insufficient documentation

## 2012-11-12 DIAGNOSIS — R079 Chest pain, unspecified: Secondary | ICD-10-CM

## 2012-11-12 DIAGNOSIS — E1165 Type 2 diabetes mellitus with hyperglycemia: Secondary | ICD-10-CM

## 2012-11-12 DIAGNOSIS — R0609 Other forms of dyspnea: Secondary | ICD-10-CM

## 2012-11-12 NOTE — Patient Instructions (Signed)
Your physician recommends that you schedule a follow-up appointment in: After testing  echocardiogram  Treadmill Stress Test

## 2012-11-12 NOTE — Assessment & Plan Note (Signed)
Well controlled.  Continue current medications and low sodium Dash type diet.    

## 2012-11-12 NOTE — Assessment & Plan Note (Signed)
Atypical but poorly controlled diabetic  Since ECG is normal will order regular ETT

## 2012-11-12 NOTE — Progress Notes (Signed)
Patient ID: [name], female   DOB: 08/15/1966, 46 y.o.   MRN: [id number] 45 yo obese female with newly diagnosed uncontrolled diabetes.  Referred by Dr Everardo All and [facility] for chest pain.  Pain is atypical.  Months At [place] and radiates to arm.  With exertion she has functional dyspnea/  No history of DVT or PE.  BS was over 500 a couple of months ago and on insulin.  Seeing dietician soon but does better with diet.  Last saw Dr Dietrich Pates 2/12 and had normal stress myovue.  Compliant with meds  On ACE.  No pleuritic pain or lung disease.  CT negative a year ago for PE  ROS: Denies fever, malais, weight loss, blurry vision, decreased visual acuity, cough, sputum, SOB, hemoptysis, pleuritic pain, palpitaitons, heartburn, abdominal pain, melena, lower extremity edema, claudication, or rash.  All other systems reviewed and negative   General: Affect appropriate Obese female HEENT: normal Neck supple with no adenopathy JVP normal no bruits no thyromegaly Lungs clear with no wheezing and good diaphragmatic motion Heart:  S1/S2 no murmur,rub, gallop or click PMI normal Abdomen: benighn, BS positve, no tenderness, no AAA no bruit.  No HSM or HJR Distal pulses intact with no bruits No edema Neuro non-focal Skin warm and dry No muscular weakness  Medications Current Outpatient Prescriptions  Medication Sig Dispense Refill  . ascorbic acid (VITAMIN C) 250 MG CHEW Chew 500 mg by mouth daily.      . clotrimazole-betamethasone (LOTRISONE) cream Apply 1 application topically 2 (two) times daily as needed. Apply to affected area 2 times daily for rash      . gabapentin (NEURONTIN) 300 MG capsule Take 1 capsule (300 mg total) by mouth 3 (three) times daily.  90 capsule  1  . insulin aspart (NOVOLOG) 100 UNIT/ML injection Inject 15 Units into the skin 3 (three) times daily with meals.  10 mL  3  . insulin glargine (LANTUS) 100 UNIT/ML injection Inject 50 Units into the skin at bedtime.   10 mL  3  . lisinopril (ZESTRIL) 2.5 MG tablet Take 1 tablet (2.5 mg total) by mouth daily. For kidney protection  30 tablet  6  . megestrol (MEGACE) 40 MG tablet Take 40 mg by mouth as needed.       . metFORMIN (GLUCOPHAGE) 500 MG tablet Take 500 mg by mouth 2 (two) times daily with a meal.      . nystatin (MYCOSTATIN/NYSTOP) 100000 UNIT/GM POWD Apply 1 g topically 3 (three) times daily as needed. For rash        Allergies Ciprofloxacin; Penicillins; Codeine; Morphine; and Sulfonamide derivatives  Family History: Family History  Problem Relation Age of Onset  . Heart disease    . Cancer    . Diabetes    . Achalasia    . Anesthesia problems Neg Hx   . Hypotension Neg Hx   . Malignant hyperthermia Neg Hx   . Pseudochol deficiency Neg Hx   . Diabetes Mother   . Depression Paternal Grandmother   . Depression Paternal Grandfather     Social History: History   Social History  . Marital Status: Married    Spouse Name: N/A    Number of Children: 2  . Years of Education: college   Occupational History  .    [employer]   Social History Main Topics  . Smoking status: Never Smoker   . Smokeless tobacco: Never Used  . Alcohol Use: No  .  Drug Use: No  . Sexually Active: Yes -- Female partner(s)   Other Topics Concern  . Not on file   Social History Narrative   Regular exercise: walks Caffeine use: not regularlyEmployed and full time student at [employer]    Electrocardiogram:  NSR rate 86  Normal ECG  Assessment and Plan

## 2012-11-12 NOTE — Assessment & Plan Note (Signed)
Likely functional Mild dependant edema  Check echo for RV and LV function

## 2012-11-12 NOTE — Assessment & Plan Note (Signed)
Cholesterol is at goal.  Continue current dose of statin and diet Rx.  No myalgias or side effects.  F/U  LFT's in 6 months. Lab Results  Component Value Date   LDLCALC 105* 08/26/2012

## 2012-11-12 NOTE — Assessment & Plan Note (Signed)
Seeing dietician this week F/U [facility].  Target A1c 6.5 or less ACE

## 2012-11-13 ENCOUNTER — Ambulatory Visit: Payer: No Typology Code available for payment source | Admitting: Internal Medicine

## 2012-11-14 ENCOUNTER — Ambulatory Visit: Payer: No Typology Code available for payment source | Admitting: Internal Medicine

## 2012-11-14 ENCOUNTER — Other Ambulatory Visit: Payer: Self-pay | Admitting: Internal Medicine

## 2012-11-14 ENCOUNTER — Encounter: Payer: Self-pay | Admitting: Internal Medicine

## 2012-11-14 ENCOUNTER — Ambulatory Visit (INDEPENDENT_AMBULATORY_CARE_PROVIDER_SITE_OTHER): Payer: No Typology Code available for payment source | Admitting: Internal Medicine

## 2012-11-14 VITALS — BP 126/70 | HR 100 | Temp 98.5°F | Wt 229.0 lb

## 2012-11-14 DIAGNOSIS — E118 Type 2 diabetes mellitus with unspecified complications: Secondary | ICD-10-CM

## 2012-11-14 DIAGNOSIS — E1165 Type 2 diabetes mellitus with hyperglycemia: Secondary | ICD-10-CM

## 2012-11-14 DIAGNOSIS — R079 Chest pain, unspecified: Secondary | ICD-10-CM

## 2012-11-14 DIAGNOSIS — IMO0001 Reserved for inherently not codable concepts without codable children: Secondary | ICD-10-CM

## 2012-11-14 DIAGNOSIS — IMO0002 Reserved for concepts with insufficient information to code with codable children: Secondary | ICD-10-CM

## 2012-11-14 LAB — HEMOGLOBIN A1C: Mean Plasma Glucose: 214 mg/dL — ABNORMAL HIGH (ref <117)

## 2012-11-14 LAB — MICROALBUMIN / CREATININE URINE RATIO
Creatinine, Urine: 44.8 mg/dL
Microalb Creat Ratio: 24.6 mg/g (ref 0.0–30.0)

## 2012-11-14 MED ORDER — INSULIN GLARGINE 100 UNIT/ML ~~LOC~~ SOLN: 55.0000 [IU] | Freq: Every day | SUBCUTANEOUS | Status: DC

## 2012-11-14 NOTE — Progress Notes (Signed)
Patient ID: AUBERY DATE, female   DOB: 1966-11-28, 46 y.o.   MRN: [id number]  Subjective:    HPI Pt.s for for follow up of DM2, insulin-dependent,uncontrolled, dx this Spring. Last visit was 1 month ago.  She is on: - Lantus 50 units qhs (increased from 40 approx 1 mo ago by PCP)  - Novolog 25 units for each of the three meals.  - Metformin 500 mg bid.   At last visit, she was telling me she injected in her abdomen but sometimes in the arm or leg. I advised her to try to stick with the abdomen and rotate inj. Sites above and below the navel. She was getting confused and was forgetting if she had taken insulin or not (at least 3/7 times a week she forgot to take Lantus). I gave her sugar logs and advised her how to make sure she does not forget doses. I also advised her not to use Lantus and Novolog bottles for more than a month after opening. We also stopped Invokana as she has a h/o UTIs/pyelonephritis. We also discussed about an insulin pump at last visit, but I thought this was a little premature for her (see my last note).  Last HbA1C: Lab Results  Component Value Date   HGBA1C 6.8* 08/26/2012  No previous microalbumin/creatinine ratio in the system. Last foot exam was 1 mo ago with Dr. Jeanice Lim. No diabetic nephropathy. She has neuropathy, for which she was started on Neurontin qhs by Dr. Jeanice Lim. Last eye exam was 2 years ago. She also c/o chest pressure, chronic, for which I referred her to cardiology and she just saw [facility] in [place] (note reviewed) >> appears as done by Dr. Eden Emms, but she mentioned that she was seen by somebody else. She would like to switch cardiologists and be referred to her husband's cardiologist, Dr. Allyson Sabal (done).   She checks her sugars 5-7x a day: - am: 110-180, previously 160-200 - pre-lunch: 140-180, previously 158-190 - pre-dinner: approx the same, previously 160-180 One low, at 66 (felt) >> not sure why.   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 Takes Megace as needed for bleeding. Allergies  Allergen Reactions  . Ciprofloxacin Hives and Swelling  . Penicillins Anaphylaxis and Rash  . Codeine Other (See Comments)    REACTION:  had hallicunations  . Morphine Nausea And Vomiting and Other (See Comments)    REACTION:   recieved in ED due to [facility] and had multple doses - gave visual hallucinations and vomitting.  . Sulfonamide Derivatives Other (See Comments)    REACTION: Unsure - childhood allergy   Review of Systems  Constitutional: Negative for fever, chills, fatigue and unexpected weight change.       Overweight  HENT: Negative for congestion and trouble swallowing.   Eyes: Negative for visual disturbance.  Respiratory: Positive for chest tightness and shortness of breath. Negative for cough.  Cardiovascular: Positive for chest pain. Negative for palpitations and leg swelling.  Gastrointestinal: Negative for nausea, vomiting, abdominal pain, diarrhea and constipation.  Genitourinary: Positive for menstrual problem. Negative for dysuria.       Frequent urination, occasional incontinence  Musculoskeletal: Negative for myalgias and arthralgias.  Skin: Negative for rash.  Neurological: Positive for dizziness and headaches. Negative for weakness and light-headedness.  Psychiatric/Behavioral: Negative for dysphoric mood. The patient is not nervous/anxious.   All other systems reviewed and are negative.   Objective:   Physical Exam  Nursing note and  vitals reviewed. Constitutional: She is oriented to person, place, and time.       Obese, in NAD  Eyes: EOM are normal. Pupils are equal, round, and reactive to light.  Neck: Neck supple. No thyromegaly present.  Cardiovascular: Normal rate and regular rhythm.  Exam reveals no gallop and no friction rub.   No murmur heard. Pulmonary/Chest: Effort normal and breath sounds normal.  Abdominal: Soft. Bowel sounds are normal. There is no tenderness.  Musculoskeletal: She exhibits no edema.  Lymphadenopathy:    She has no cervical adenopathy.  Neurological: She is alert and oriented to person, place, and time. She displays normal reflexes.  Skin: Skin is warm and dry. No rash noted.  Psychiatric: She has a normal mood and affect.   LMP 07/16/2012 Wt Readings from Last 3 Encounters:  11/12/12 229 lb (103.874 kg)  10/16/12 228 lb (103.42 kg)  10/14/12 228 lb 0.6 oz (103.438 kg)      Assessment:     1. DM2 - insulin-dependent, uncontrolled    Plan:     1. DM2 - uncontrolled, new dg 7 months ago - reviewed her a sugar log >> sugars slightly improved, and she does not forget to take her insulin anymore - she checks her sugars both before and 2h post meals and the trend is not consistent > might increase or decrease, depending on the meal size and composition. - I advised her to use the following mealtime insulin doses: - for a small meal: 15 units - for a medium meal: 20 units - for a large meal: 25 units - I did advise her to use the above doses as a starting point, and she can increase/decrease the doses as a function of the pre- and post-meal sugars - will need nutritionist teaching for carb counting and will then calculate a more accurate mealtime insulin requirement (per the 500/TDD rule, she has a 1:4 ICR requirement now, but likely needs more) - We will also add to these doses the following sliding scale (per the 1800/TDD rule >> 1800/125 = 14.4, used an ISF of 15): 150-165: +1  unit 166-180: +2 units  181-195: +3 units 196-210: +4 units 211-225: +5 units 226-240: +6 units 241-255: +7 units, etc. - We did several practice exercises and she appears to have understood the calculations - We will increase the Metformin to target dose, and this will likely help with overnight and am sugars. - For now, will keep Lantus the same, at 50 units nightly - discussed healthy dietary choices - e.g substitute flat bread 90 calories for crackers (saltines) or bread - she will return to see me in a month, with CBG log. - continue Lisinopril - advised her to join MyChart - pt will make an appt with Dr Nile Riggs (ophthalmology, [facility]) through PCP per her preferrence - I will see her back in 1 mo - will check HbA1C and UACR today - until she  joins MyChart, she would like me to email her the lab results  Orders Only on 11/14/2012  Component Date Value Range Status  . Microalb, Ur 11/14/2012 1.10  0.00 - 1.89 mg/dL Final  . Creatinine, Urine 11/14/2012 44.8   Final  . Microalb Creat Ratio 11/14/2012 24.6  0.0 - 30.0 mg/g Final  . Hemoglobin A1C 11/14/2012 9.1* <5.7 % Final   Comment:                                                                                                 According to the ADA Clinical Practice Recommendations for 2011, when                          HbA1c is used as a screening test:                                                       >=6.5%   Diagnostic of Diabetes Mellitus                                     (if abnormal result is confirmed)                                                     5.7-6.4%   Increased risk of developing Diabetes Mellitus                                                     References:Diagnosis and Classification of Diabetes Mellitus,Diabetes                          Care,2011,34(Suppl 1):S62-S69 and Standards of Medical Care in                                  Diabetes - 2011,Diabetes Care,2011,34 (Suppl 1):S11-S61.                              . Mean Plasma Glucose 11/14/2012 214* <117 mg/dL Final   Email sent (please see letter section). No change in plan for now.

## 2012-11-14 NOTE — Patient Instructions (Addendum)
Pleasekeep the Lantus dose at 50 units at night. Increase the Metformin to 1000 mg (2 tablets) twice a day. Please use the following Novolog doses: - for a small meal: 15 units - for a medium meal: 20 units - for a large meal: 25 units Please add to these doses the following sliding scale: 150-165: +1 unit 166-180: +2 units  181-195: +3 units 196-210: +4 units 211-225: +5 units 226-240: +6 units 241-255: +7 units, etc. Please return to see me in a month, with your log. Please have the nutritionist send me his notes - our fax is [phone].

## 2012-11-17 ENCOUNTER — Encounter: Payer: Self-pay | Admitting: Internal Medicine

## 2012-11-17 ENCOUNTER — Telehealth: Payer: Self-pay | Admitting: Family Medicine

## 2012-11-17 ENCOUNTER — Telehealth: Payer: Self-pay | Admitting: *Deleted

## 2012-11-17 DIAGNOSIS — E1165 Type 2 diabetes mellitus with hyperglycemia: Secondary | ICD-10-CM

## 2012-11-17 DIAGNOSIS — IMO0002 Reserved for concepts with insufficient information to code with codable children: Secondary | ICD-10-CM

## 2012-11-17 NOTE — Telephone Encounter (Signed)
Tried again to contact the pt. Got her on the phone but we got interrupted and I could not reestablish the connection - kept getting the voice mail. I left a message that I would call her tomorrow am.

## 2012-11-17 NOTE — Telephone Encounter (Signed)
Patient called concern of blood sugars are being so high over the week end. This morning was in the 200's and now before lunch was 300's. Patient was seen on Friday and is doing sliding scale of novalog and lantus . Patient concern of headache/ neck chest pain on right side only. Has appt. With parimary doctor tomorrow for these symptoms unless this is from her blood sugar being high.

## 2012-11-17 NOTE — Telephone Encounter (Signed)
Tried both contact nrs. But was only able to leave messages. Will retry. I told her in the meantime to call us if she gets my message.

## 2012-11-17 NOTE — Telephone Encounter (Signed)
Has been having off and on hurting in her chest and in her neck. States she went to cardiology and they did EKG and everything was ok. Said she didn't want to go to the ED. States she just doesn't feel right and per scheduling there were no openings all week. I put the pt in for tomorrow in the open 8am slot (it was on hold per your request) so I just wanted to let you know

## 2012-11-17 NOTE — Telephone Encounter (Signed)
Pt did not respnd to contact attempt. Final attempt. Sent letter to pt home via [place] Mail in attempt to contact pt to schedule appointment.

## 2012-11-18 ENCOUNTER — Encounter (HOSPITAL_COMMUNITY): Payer: Self-pay | Admitting: *Deleted

## 2012-11-18 ENCOUNTER — Ambulatory Visit (INDEPENDENT_AMBULATORY_CARE_PROVIDER_SITE_OTHER): Payer: No Typology Code available for payment source | Admitting: Family Medicine

## 2012-11-18 ENCOUNTER — Observation Stay (HOSPITAL_COMMUNITY)
Admission: EM | Admit: 2012-11-18 | Discharge: 2012-11-19 | Disposition: A | Discharge status: Home / Self Care | Payer: BC Managed Care – PPO | Attending: Internal Medicine | Admitting: Internal Medicine

## 2012-11-18 ENCOUNTER — Emergency Department (HOSPITAL_COMMUNITY): Payer: BC Managed Care – PPO

## 2012-11-18 ENCOUNTER — Encounter: Payer: Self-pay | Admitting: Family Medicine

## 2012-11-18 VITALS — BP 124/82 | HR 85 | Resp 15 | Ht <= 58 in | Wt 232.4 lb

## 2012-11-18 DIAGNOSIS — E1165 Type 2 diabetes mellitus with hyperglycemia: Secondary | ICD-10-CM

## 2012-11-18 DIAGNOSIS — Z794 Long term (current) use of insulin: Secondary | ICD-10-CM | POA: Insufficient documentation

## 2012-11-18 DIAGNOSIS — R0602 Shortness of breath: Secondary | ICD-10-CM | POA: Insufficient documentation

## 2012-11-18 DIAGNOSIS — R079 Chest pain, unspecified: Secondary | ICD-10-CM

## 2012-11-18 DIAGNOSIS — IMO0001 Reserved for inherently not codable concepts without codable children: Secondary | ICD-10-CM | POA: Insufficient documentation

## 2012-11-18 DIAGNOSIS — I10 Essential (primary) hypertension: Secondary | ICD-10-CM

## 2012-11-18 DIAGNOSIS — IMO0002 Reserved for concepts with insufficient information to code with codable children: Secondary | ICD-10-CM | POA: Diagnosis present

## 2012-11-18 DIAGNOSIS — G589 Mononeuropathy, unspecified: Secondary | ICD-10-CM

## 2012-11-18 DIAGNOSIS — Z6838 Body mass index (BMI) 38.0-38.9, adult: Secondary | ICD-10-CM | POA: Diagnosis present

## 2012-11-18 DIAGNOSIS — R0600 Dyspnea, unspecified: Secondary | ICD-10-CM

## 2012-11-18 DIAGNOSIS — E669 Obesity, unspecified: Secondary | ICD-10-CM

## 2012-11-18 DIAGNOSIS — E119 Type 2 diabetes mellitus without complications: Secondary | ICD-10-CM

## 2012-11-18 DIAGNOSIS — E118 Type 2 diabetes mellitus with unspecified complications: Secondary | ICD-10-CM

## 2012-11-18 DIAGNOSIS — G629 Polyneuropathy, unspecified: Secondary | ICD-10-CM

## 2012-11-18 DIAGNOSIS — E782 Mixed hyperlipidemia: Secondary | ICD-10-CM | POA: Diagnosis present

## 2012-11-18 DIAGNOSIS — E785 Hyperlipidemia, unspecified: Secondary | ICD-10-CM | POA: Diagnosis present

## 2012-11-18 LAB — BASIC METABOLIC PANEL
BUN: 7 mg/dL (ref 6–23)
Calcium: 9 mg/dL (ref 8.4–10.5)
Creatinine, Ser: 0.82 mg/dL (ref 0.50–1.10)
GFR calc Af Amer: >90 mL/min (ref >90)
GFR calc non Af Amer: 85 mL/min — ABNORMAL LOW (ref >90)

## 2012-11-18 LAB — CBC WITH DIFFERENTIAL/PLATELET
Basophils Absolute: 0 10*3/uL (ref 0.0–0.1)
Basophils Relative: 0 % (ref 0–1)
Eosinophils Absolute: 0.1 10*3/uL (ref 0.0–0.7)
Eosinophils Relative: 2 % (ref 0–5)
HCT: 37.6 % (ref 36.0–46.0)
Hemoglobin: 12.9 g/dL (ref 12.0–15.0)
MCH: 28.2 pg (ref 26.0–34.0)
MCHC: 34.3 g/dL (ref 30.0–36.0)
Monocytes Absolute: 0.4 10*3/uL (ref 0.1–1.0)
Monocytes Relative: 6 % (ref 3–12)
RDW: 13.5 % (ref 11.5–15.5)

## 2012-11-18 LAB — TROPONIN I: Troponin I: <0.3 ng/mL (ref <0.30)

## 2012-11-18 LAB — HEPATIC FUNCTION PANEL
Albumin: 3.3 g/dL — ABNORMAL LOW (ref 3.5–5.2)
Alkaline Phosphatase: 104 U/L (ref 39–117)
Bilirubin, Direct: 0.1 mg/dL (ref 0.0–0.3)
Indirect Bilirubin: 0.4 mg/dL (ref 0.3–0.9)
Total Bilirubin: 0.5 mg/dL (ref 0.3–1.2)

## 2012-11-18 LAB — LIPASE, BLOOD: Lipase: 36 U/L (ref 11–59)

## 2012-11-18 LAB — GLUCOSE, CAPILLARY: Glucose-Capillary: 271 mg/dL — ABNORMAL HIGH (ref 70–99)

## 2012-11-18 MED ORDER — ACETAMINOPHEN 325 MG PO TABS
650.0000 mg | ORAL_TABLET | Freq: Four times a day (QID) | ORAL | Status: DC | PRN
  Administered 2012-11-19 (×2): 650 mg via ORAL
  Filled 2012-11-18 (×2): qty 2

## 2012-11-18 MED ORDER — ASPIRIN 81 MG PO CHEW
324.0000 mg | CHEWABLE_TABLET | Freq: Once | ORAL | Status: AC
  Administered 2012-11-18: 324 mg via ORAL
  Filled 2012-11-18: qty 4

## 2012-11-18 MED ORDER — INSULIN ASPART 100 UNIT/ML ~~LOC~~ SOLN
0.0000 [IU] | Freq: Three times a day (TID) | SUBCUTANEOUS | Status: DC
  Administered 2012-11-18: 5 [IU] via SUBCUTANEOUS
  Administered 2012-11-19: 3 [IU] via SUBCUTANEOUS

## 2012-11-18 MED ORDER — GABAPENTIN 300 MG PO CAPS
300.0000 mg | ORAL_CAPSULE | Freq: Three times a day (TID) | ORAL | Status: DC
  Administered 2012-11-18 – 2012-11-19 (×3): 300 mg via ORAL
  Filled 2012-11-18 (×3): qty 1

## 2012-11-18 MED ORDER — INSULIN ASPART 100 UNIT/ML ~~LOC~~ SOLN
25.0000 [IU] | Freq: Three times a day (TID) | SUBCUTANEOUS | Status: DC
  Administered 2012-11-18 – 2012-11-19 (×3): 25 [IU] via SUBCUTANEOUS

## 2012-11-18 MED ORDER — LISINOPRIL 5 MG PO TABS
2.5000 mg | ORAL_TABLET | Freq: Every day | ORAL | Status: DC
  Administered 2012-11-18: 2.5 mg via ORAL
  Filled 2012-11-18 (×2): qty 1

## 2012-11-18 MED ORDER — HYDROMORPHONE HCL PF 1 MG/ML IJ SOLN
1.0000 mg | Freq: Once | INTRAMUSCULAR | Status: AC
  Administered 2012-11-18: 1 mg via INTRAVENOUS
  Filled 2012-11-18: qty 1

## 2012-11-18 MED ORDER — ACETAMINOPHEN 650 MG RE SUPP: 650.0000 mg | Freq: Four times a day (QID) | RECTAL | Status: DC | PRN

## 2012-11-18 MED ORDER — INSULIN ASPART 100 UNIT/ML ~~LOC~~ SOLN
0.0000 [IU] | Freq: Every day | SUBCUTANEOUS | Status: DC
  Administered 2012-11-18: 3 [IU] via SUBCUTANEOUS

## 2012-11-18 MED ORDER — ENOXAPARIN SODIUM 40 MG/0.4ML ~~LOC~~ SOLN
40.0000 mg | SUBCUTANEOUS | Status: DC
  Administered 2012-11-18: 40 mg via SUBCUTANEOUS
  Filled 2012-11-18: qty 0.4

## 2012-11-18 MED ORDER — OXYCODONE HCL 5 MG PO TABS
5.0000 mg | ORAL_TABLET | Freq: Four times a day (QID) | ORAL | Status: DC | PRN
  Administered 2012-11-18 – 2012-11-19 (×2): 5 mg via ORAL
  Filled 2012-11-18 (×2): qty 1

## 2012-11-18 MED ORDER — NITROGLYCERIN 0.4 MG SL SUBL
0.4000 mg | SUBLINGUAL_TABLET | Freq: Once | SUBLINGUAL | Status: AC
  Administered 2012-11-18: 0.4 mg via SUBLINGUAL
  Filled 2012-11-18: qty 25

## 2012-11-18 MED ORDER — SODIUM CHLORIDE 0.9 % IV SOLN: INTRAVENOUS | Status: DC

## 2012-11-18 MED ORDER — ASPIRIN EC 81 MG PO TBEC
81.0000 mg | DELAYED_RELEASE_TABLET | Freq: Every day | ORAL | Status: DC
  Administered 2012-11-19: 81 mg via ORAL
  Filled 2012-11-18: qty 1

## 2012-11-18 MED ORDER — ONDANSETRON HCL 4 MG/2ML IJ SOLN: 4.0000 mg | Freq: Three times a day (TID) | INTRAMUSCULAR | Status: DC | PRN

## 2012-11-18 MED ORDER — INSULIN GLARGINE 100 UNIT/ML ~~LOC~~ SOLN
40.0000 [IU] | Freq: Every day | SUBCUTANEOUS | Status: DC
  Administered 2012-11-18: 40 [IU] via SUBCUTANEOUS

## 2012-11-18 MED ORDER — ONDANSETRON HCL 4 MG/2ML IJ SOLN
4.0000 mg | Freq: Four times a day (QID) | INTRAMUSCULAR | Status: DC | PRN
  Administered 2012-11-18: 4 mg via INTRAVENOUS
  Filled 2012-11-18: qty 2

## 2012-11-18 MED ORDER — ONDANSETRON HCL 4 MG PO TABS: 4.0000 mg | ORAL_TABLET | Freq: Four times a day (QID) | ORAL | Status: DC | PRN

## 2012-11-18 MED ORDER — SODIUM CHLORIDE 0.9 % IJ SOLN
3.0000 mL | Freq: Two times a day (BID) | INTRAMUSCULAR | Status: DC
  Administered 2012-11-18: 3 mL via INTRAVENOUS

## 2012-11-18 MED ORDER — METFORMIN HCL 500 MG PO TABS
500.0000 mg | ORAL_TABLET | Freq: Two times a day (BID) | ORAL | Status: DC
  Administered 2012-11-19 (×2): 500 mg via ORAL
  Filled 2012-11-18 (×2): qty 1

## 2012-11-18 NOTE — Telephone Encounter (Signed)
Tried again to call pt >> only got VM on both contact nr's. Will retry later.

## 2012-11-18 NOTE — Patient Instructions (Signed)
Go directly to the ER to be evaluated

## 2012-11-18 NOTE — ED Notes (Signed)
Pt updated on plan of care, delay in being moved to the floor, pt and family express understanding.

## 2012-11-18 NOTE — Assessment & Plan Note (Signed)
At goal.  

## 2012-11-18 NOTE — ED Notes (Signed)
Pt sent to er from Dr. Jeanice Lim office this am with c/o elevated blood sugar, pt states "my sugar has been out of whack" all week, BSL running in 300 range over the past week, pt c/o right side chest pain that radiates down right arm and right jaw area, described as sharp, constant, and feels like "something in sitting on her chest at times", admits to sob, diarrhea. Dr. Rulon Abide at bedside,

## 2012-11-18 NOTE — ED Provider Notes (Addendum)
History    This chart was scribed for [name] Skene, MD, MD by [name], ED Scribe. The patient was seen in room APA14 and the patient's care was started at 9:07AM.   CSN: [id number]  Arrival date & time 11/18/12  4098      Chief Complaint  Patient presents with  . Chest Pain  . Hyperglycemia     The history is provided by the patient. No language interpreter was used.   [name] is a 46 y.o. female with hx of DM who presents to the Emergency Department referred here by PCP (Dr. Jeanice Lim) complaining of coming and going, moderate to severe right chest pain radiating to right arm onset 3 days ago. Symptoms have been constant since last night. Feels like pressure and "someone sitting on chest." Pain is rated at 9/10. Pt reports having mild SOB. Per pt her blood glucose is usually in 300s. She was diagnosed with DM earlier this year. Pt takes 3 shots of novolog daily and lantis at night daily. Pt reports having episodes of hot and cold and diarrhea. She reports being dizzy, fatigue, increased thirst and urinating frequently. Denies abdominal pain, vomiting and any other pain. Reports having gallbladder removed in 1995.   Pt has family hx of heart complications (paternal and maternal grandfather) and father has a-fib.   Past Medical History  Diagnosis Date  . Cancer  spot on lung last year   . Diabetes mellitus     Past Surgical History  Procedure Date  . Cesarean section 1994;2000    [place]  . Cholecystectomy 1992  . Sinus sergery 1988    [place]  . Foot surgery March, 2013    [facility]  . Hysteroscopy with thermachoice 04/25/2012    Procedure: HYSTEROSCOPY WITH THERMACHOICE;  Surgeon: Lazaro Arms, MD;  Location: [facility];  Service: Gynecology;  Laterality: N/A;  total therapy time= 11 minutes 42 seconds; 34 ml D5w  in and 34 ml D5w out; temp =87 degrees F  . Cystoscopy w/ ureteral stent placement 05/08/2012    Procedure: CYSTOSCOPY WITH RETROGRADE  PYELOGRAM/URETERAL STENT PLACEMENT;  Surgeon: Ky Barban, MD;  Location: [facility];  Service: Urology;  Laterality: Right;    Family History  Problem Relation Age of Onset  . Heart disease    . Cancer    . Diabetes    . Achalasia    . Anesthesia problems Neg Hx   . Hypotension Neg Hx   . Malignant hyperthermia Neg Hx   . Pseudochol deficiency Neg Hx   . Diabetes Mother   . Depression Paternal Grandmother   . Depression Paternal Grandfather     History  Substance Use Topics  . Smoking status: Never Smoker   . Smokeless tobacco: Never Used  . Alcohol Use: No    OB History    Grav Para Term Preterm Abortions TAB SAB Ect Mult Living                  Review of Systems At least 10pt or greater review of systems completed and are negative except where specified in the HPI.  Allergies  Ciprofloxacin; Penicillins; Codeine; Morphine; and Sulfonamide derivatives  Home Medications   Current Outpatient Rx  Name  Route  Sig  Dispense  Refill  . ASCORBIC ACID 250 MG PO CHEW   Oral   Chew 500 mg by mouth daily.         Marland Kitchen CLOTRIMAZOLE-BETAMETHASONE 1-0.05 % EX CREA  Topical   Apply 1 application topically 2 (two) times daily as needed. Apply to affected area 2 times daily for rash         . GABAPENTIN 300 MG PO CAPS   Oral   Take 1 capsule (300 mg total) by mouth 3 (three) times daily.   90 capsule   1   . INSULIN ASPART 100 UNIT/ML [place] SOLN   Subcutaneous   Inject 15 Units into the skin 3 (three) times daily with meals.   10 mL   3   . INSULIN GLARGINE 100 UNIT/ML [place] SOLN   Subcutaneous   Inject 55 Units into the skin at bedtime.   10 mL   3   . LISINOPRIL 2.5 MG PO TABS   Oral   Take 1 tablet (2.5 mg total) by mouth daily. For kidney protection   30 tablet   6   . MEGESTROL ACETATE 40 MG PO TABS   Oral   Take 40 mg by mouth as needed.          Marland Kitchen METFORMIN HCL 500 MG PO TABS   Oral   Take 500 mg by mouth 2 (two) times daily with a meal.          . NYSTATIN 100000 UNIT/GM EX POWD   Topical   Apply 1 g topically 3 (three) times daily as needed. For rash           BP 157/86  Pulse 92  Temp 97.7 F (36.5 C)  Resp 20  Ht 4\' 8"  (1.422 m)  Wt 230 lb (104.327 kg)  BMI 51.56 kg/m2  SpO2 97%  Physical Exam  Nursing note and vitals reviewed.   Nursing notes reviewed.  Electronic medical record reviewed. VITAL SIGNS:   Filed Vitals:   11/18/12 1000 11/18/12 1100 11/18/12 1122 11/18/12 1324  BP: 144/84 118/89 118/89 132/78  Pulse: 88  87 92  Temp:      Resp:   20 20  Height:      Weight:      SpO2: 97%  98% 97%   CONSTITUTIONAL: Awake, oriented, appears non-toxic, central adiposity HENT: Atraumatic, normocephalic, oral mucosa pink and moist, airway patent. Nares patent without drainage. External ears normal. EYES: Conjunctiva clear, EOMI, PERRLA NECK: Trachea midline, non-tender, supple CARDIOVASCULAR: Normal heart rate, Normal rhythm, No murmurs, rubs, gallops PULMONARY/CHEST: Clear to auscultation, no rhonchi, wheezes, or rales. Symmetrical breath sounds. Non-tender. ABDOMINAL: Non-distended, soft, obese non-tender - no rebound or guarding.  BS normal. NEUROLOGIC: Non-focal, moving all four extremities, no gross sensory or motor deficits. EXTREMITIES: No clubbing, cyanosis. 1+ lower extremity edema bilaterally SKIN: Warm, Dry, No erythema, No rash  ED Course  Procedures (including critical care time)  Date: 11/18/2012  Rate: 91  Rhythm: normal sinus rhythm  QRS Axis: normal  Intervals: normal  ST/T Wave abnormalities: normal  Conduction Disutrbances: none  Narrative Interpretation: No ST or T wave abnormalities suggestive of ischemia or infarction-no significant change compared with prior EKG dated 05/08/2012  DIAGNOSTIC STUDIES: Oxygen Saturation is 97% on room air, normal by my interpretation.    COORDINATION OF CARE: 9:13 AM Discussed ED treatment with pt  9:37 AM Ordered:     . [COMPLETED] aspirin   324 mg Oral Once  . [COMPLETED] nitroGLYCERIN  0.4 mg Sublingual Once    Labs Reviewed  BASIC METABOLIC PANEL - Abnormal; Notable for the following:    Glucose, Bld 251 (*)     GFR calc non  Af Amer 85 (*)     All other components within normal limits  GLUCOSE, CAPILLARY - Abnormal; Notable for the following:    Glucose-Capillary 245 (*)     All other components within normal limits  CBC WITH DIFFERENTIAL  TROPONIN I  HEPATIC FUNCTION PANEL  LIPASE, BLOOD   Dg Chest Portable 1 View  11/18/2012  *RADIOLOGY REPORT*  Clinical Data: Chest pain, hyperglycemia  PORTABLE CHEST - 1 VIEW  Comparison: 01/27/2012; 05/08/2011; chest CT - 11/05/2011  Findings: Grossly unchanged cardiac silhouette and mediastinal contours.  Improved aeration of the bilateral lung bases.  No new focal airspace opacities.  No definite pleural effusion or pneumothorax.  Unchanged bones.  IMPRESSION: No acute cardiopulmonary disease.   Original Report Authenticated By: Tacey Ruiz, MD      1. Chest pain   2. Dyspnea   3. Diabetes       MDM  YANEISY WENZ is a 46 y.o. female presenting from her primary care physician's office with elevated blood sugar and chest pain. Patient's chest pain is concerning for acute coronary syndrome, her history is not consistent with pneumonia, pneumothorax, pericarditis, transected esophagus, or dissecting aorta.  Patient does have a history of diabetes, she has central adiposity, she is 4 foot 8 and 200 pounds.  Discussed this patient with cardiology and hospitalist will admit, stress test to be done stress echo as well.  Patient's story is concerning for ACS.  She is stable to the emergency department and admitted to the hospital service  I personally performed the services described in this documentation, which was scribed in my presence. The recorded information has been reviewed and is accurate. [name], M.D.         [name] Skene, MD 11/18/12 1543  [name] Skene, MD 11/18/12 1610

## 2012-11-18 NOTE — Progress Notes (Signed)
  Subjective:    Patient ID: [name], female    DOB: 06-Mar-1966, 46 y.o.   MRN: [id number]  HPI   Patient presents with chest pain since Thursday which is been progressively worse. She complains of substernal chest pain that radiates to the left shoulder neck and left arm. She was afraid to go the emergency room because she thought she would have to be admitted there for did not. Her blood sugars have also been very elevated in the 300s. She's currently on Lantus 50 units as well as NovoLog 25 units + scale which was prescribed by her endocrinologist. She's all of our heart care last week and was set up to have exercise stress testing as well as echocardiogram. Chest pain also associated with shortness of breath.    Review of Systems  GEN- + fatigue, fever, weight loss,weakness, recent illness HEENT- denies eye drainage, change in vision, nasal discharge, CVS- +chest pain, palpitations RESP- +SOB, cough, wheeze ABD- denies N/V, change in stools, abd pain GU- denies dysuria, hematuria, dribbling, incontinence MSK- denies joint pain, muscle aches, injury Neuro- + headache, dizziness, syncope, seizure activity      Objective:   Physical Exam GEN- NAD, alert and oriented x3 HEENT- PERRL, EOMI, non injected sclera, pink conjunctiva, MMM, oropharynx clear Neck- Supple, TTP anterior right neck  CVS- RRR, no murmur RESP-CTAB EXT-+ pedal edema Pulses- Radial, DP- 2+  EKG-NSR, no ST changes  CBG 341 fasting in office       Assessment & Plan:

## 2012-11-18 NOTE — ED Notes (Signed)
Pt reports that the nitro did not change her pain, Dr. Rulon Abide notified, no additional orders given

## 2012-11-18 NOTE — Progress Notes (Signed)
Office informed of patient being admitted to Hospitalist service today secondary to chest pain. Records show that she was just seen in consultation by Dr. Eden Emms in the office on 12/4 for similar symptomatology - full consult note reviewed in EPIC with plan for outpatient echocardiogram and GXT (both pending). I spoke with Dr. Lendell Caprice and plan will be to have both studies arranged for tomorrow AM to go ahead and complete workup. If cardiac makers trend abnormally or the testing is significantly abnormal, our service can be re-involved.  Jonelle Sidle, M.D., F.A.C.C.

## 2012-11-18 NOTE — ED Notes (Signed)
Pt given lunch tray, pt and family updated on plan of care

## 2012-11-18 NOTE — ED Notes (Signed)
Dr. Rulon Abide in room with pt and family

## 2012-11-18 NOTE — ED Notes (Signed)
Report given to the floor,   

## 2012-11-18 NOTE — Telephone Encounter (Signed)
Pt currently in the ED for CP, will call her after d/c.

## 2012-11-18 NOTE — Assessment & Plan Note (Signed)
Uncontrolled to be evaluated in emergency room first. She has a sliding scale for her insulin upon discharge I will have her call her endocrinologist for further titration medication

## 2012-11-18 NOTE — Assessment & Plan Note (Signed)
I'm concerned for possible acute coronary syndrome. Her EKG is unremarkable in the office. However with her persistent symptoms and her risk factors for ACS will send her to the ER for evaluation. She is discharged does have stress test and echocardiogram set up this week.

## 2012-11-18 NOTE — Telephone Encounter (Signed)
Noted in office today

## 2012-11-18 NOTE — H&P (Signed)
[facility] History and Physical  [name]:[id number] DOB: [birth year] DOA: 11/18/2012  Referring physician: Dr. Rulon Abide PCP: Milinda Antis, MD  Specialists: [facility]  Chief Complaint: chest pain  HPI: [name] is a 46 y.o. female with a history of obesity, insulin-dependent diabetes, hypertension. Patient having chest pain for the last few months. She is noticeably, regular the last 2 weeks and worse over the past few days. She reports having right-sided pain which radiates up into her jaw, into her right arm, and up the right side of her face. She reports as a pressure type pain. This occurs at rest and on exertion. There is associated shortness of breath. She reports having both hot and cold spells during these episodes. Does not seem to be any relation to food. No cough or fever. No nausea or lightheadedness. She was seen by the [place] cardiology and recommendations were for echocardiogram and stress test. Unfortunately her symptoms recurred and she went to her primary care physician. Due to the recurrent nature of her symptoms, she was sent to the ER for evaluation. It was felt appropriate to admit the patient and have this workup done while she is in the hospital.  Review of Systems: Pertinent positives as per history of present illness, otherwise negative  Past Medical History  Diagnosis Date  . Diabetes mellitus    Past Surgical History  Procedure Date  . Cesarean section 1994;2000    [place]  . Cholecystectomy 1992  . Sinus sergery 1988    [place]  . Foot surgery March, 2013    [facility]  . Hysteroscopy with thermachoice 04/25/2012    Procedure: HYSTEROSCOPY WITH THERMACHOICE;  Surgeon: Lazaro Arms, MD;  Location: [facility];  Service: Gynecology;  Laterality: N/A;  total therapy time= 11 minutes 42 seconds; 34 ml D5w  in and 34 ml D5w out; temp =87 degrees F  . Cystoscopy w/ ureteral stent placement 05/08/2012    Procedure: CYSTOSCOPY WITH  RETROGRADE PYELOGRAM/URETERAL STENT PLACEMENT;  Surgeon: Ky Barban, MD;  Location: [facility];  Service: Urology;  Laterality: Right;   Social History:  reports that she has never smoked. She has never used smokeless tobacco. She reports that she does not drink alcohol or use illicit drugs. Lives at home with her family, independent with ADLs  Allergies  Allergen Reactions  . Ciprofloxacin Hives and Swelling  . Penicillins Anaphylaxis and Rash  . Codeine Other (See Comments)    REACTION:  had hallicunations  . Morphine Nausea And Vomiting and Other (See Comments)    REACTION:   recieved in ED due to [facility] and had multple doses - gave visual hallucinations and vomitting.  . Sulfonamide Derivatives Other (See Comments)    REACTION: Unsure - childhood allergy    Family History  Problem Relation Age of Onset  . Heart disease    . Cancer    . Diabetes    . Achalasia    . Anesthesia problems Neg Hx   . Hypotension Neg Hx   . Malignant hyperthermia Neg Hx   . Pseudochol deficiency Neg Hx   . Diabetes Mother   . Depression Paternal Grandmother   . Depression Paternal Grandfather     Prior to Admission medications   Medication Sig Start Date End Date Taking? Authorizing Provider  ascorbic acid (VITAMIN C) 250 MG CHEW Chew 500 mg by mouth daily.   Yes Historical Provider, MD  clotrimazole-betamethasone (LOTRISONE) cream Apply 1 application topically 2 (two) times  daily as needed. Apply to affected area 2 times daily for rash 09/12/12 09/12/13 Yes Salley Scarlet, MD  gabapentin (NEURONTIN) 300 MG capsule Take 1 capsule (300 mg total) by mouth 3 (three) times daily. 10/14/12 10/14/13 Yes Salley Scarlet, MD  insulin aspart (NOVOLOG) 100 UNIT/ML injection Inject 15 Units into the skin 3 (three) times daily with meals. 10/16/12 10/16/13 Yes Carlus Pavlov, MD  insulin glargine (LANTUS) 100 UNIT/ML injection Inject 55 Units into the skin at bedtime. 11/14/12 11/14/13 Yes Carlus Pavlov, MD  lisinopril (ZESTRIL) 2.5 MG tablet Take 1 tablet (2.5 mg total) by mouth daily. For kidney protection 10/14/12 10/14/13 Yes Salley Scarlet, MD  megestrol (MEGACE) 40 MG tablet Take 40 mg by mouth daily as needed. Patient takes as needed when period is on 07/20/12  Yes Historical Provider, MD  metFORMIN (GLUCOPHAGE) 500 MG tablet Take 500 mg by mouth 2 (two) times daily with a meal.   Yes Historical Provider, MD  nystatin (MYCOSTATIN/NYSTOP) 100000 UNIT/GM POWD Apply 1 g topically 3 (three) times daily as needed. For rash   Yes Historical Provider, MD   Physical Exam: Filed Vitals:   11/18/12 1100 11/18/12 1122 11/18/12 1324 11/18/12 1553  BP: 118/89 118/89 132/78 150/93  Pulse:  87 92 97  Temp:    99.6 F (37.6 C)  TempSrc:    Oral  Resp:  20 20 18   Height:    4\' 8"  (1.422 m)  Weight:    105.235 kg (232 lb)  SpO2:  98% 97% 97%     General:  Patient is sitting up in bed, does not appear to be any distress  Eyes: Pupils are equal round reactive to light  ENT: Mucous membranes are moist  Neck: Supple  Cardiovascular: S1, S2, regular rate and rhythm  Respiratory: Clear to auscultation bilaterally  Abdomen: Soft, obese, nontender, bowel sounds are active  Skin: Normal  Musculoskeletal: Deferred  Psychiatric: Normal affect, cooperative exam  Neurologic: Grossly intact, nonfocal  Labs on Admission:  Basic Metabolic Panel:  Lab 11/18/12 1610  NA 136  K 4.2  CL 100  CO2 28  GLUCOSE 251*  BUN 7  CREATININE 0.82  CALCIUM 9.0  MG --  PHOS --   Liver Function Tests:  Lab 11/18/12 1015  AST 17  ALT 25  ALKPHOS 104  BILITOT 0.5  PROT 6.8  ALBUMIN 3.3*    Lab 11/18/12 1015  LIPASE 36  AMYLASE --   No results found for this basename: AMMONIA:5 in the last 168 hours CBC:  Lab 11/18/12 1015  WBC 7.5  NEUTROABS 5.0  HGB 12.9  HCT 37.6  MCV 82.1  PLT 273   Cardiac Enzymes:  Lab 11/18/12 1015  CKTOTAL --  CKMB --  CKMBINDEX --  TROPONINI  <0.30    BNP (last 3 results) No results found for this basename: PROBNP:3 in the last 8760 hours CBG:  Lab 11/18/12 1543 11/18/12 1006  GLUCAP 239* 245*    Radiological Exams on Admission: Dg Chest Portable 1 View  11/18/2012  *RADIOLOGY REPORT*  Clinical Data: Chest pain, hyperglycemia  PORTABLE CHEST - 1 VIEW  Comparison: 01/27/2012; 05/08/2011; chest CT - 11/05/2011  Findings: Grossly unchanged cardiac silhouette and mediastinal contours.  Improved aeration of the bilateral lung bases.  No new focal airspace opacities.  No definite pleural effusion or pneumothorax.  Unchanged bones.  IMPRESSION: No acute cardiopulmonary disease.   Original Report Authenticated By: Tacey Ruiz, MD  EKG: Independently reviewed. No acute ST-T changes  Assessment/Plan Principal Problem:  *Chest pain Active Problems:  HYPERLIPIDEMIA  OBESITY  Diabetes mellitus type II, uncontrolled  Hypertension   1. Chest pain. Appears to be atypical. We'll pursue workup including cardiac markers every 6 hours x3. Will also check 2-D echocardiogram, exercise stress test in the morning. If this workup is negative, she can likely discharge tomorrow followup with her primary care physician. 2. Insulin-dependent diabetes. We'll continue her outpatient regimen for now. We will decrease her dose of Lantus insulin since she will be n.p.o. after midnight for stress test tomorrow. Check hemoglobin A1c 3. Hyperlipidemia. Continue outpatient regimen 4. Hypertension. Continue on this regimen   Code Status: Full code Family Communication: Discussed with patient at bedside Disposition Plan: Likely discharge home tomorrow if workup is negative  Time spent: 50 minutes  Debar Plate [facility] Pager [phone]  If 7PM-7AM, please contact night-coverage [email or web address] Password [facility] 11/18/2012, 7:49 PM

## 2012-11-19 DIAGNOSIS — R072 Precordial pain: Secondary | ICD-10-CM

## 2012-11-19 DIAGNOSIS — R079 Chest pain, unspecified: Secondary | ICD-10-CM

## 2012-11-19 DIAGNOSIS — E669 Obesity, unspecified: Secondary | ICD-10-CM

## 2012-11-19 LAB — GLUCOSE, CAPILLARY
Glucose-Capillary: 134 mg/dL — ABNORMAL HIGH (ref 70–99)
Glucose-Capillary: 181 mg/dL — ABNORMAL HIGH (ref 70–99)

## 2012-11-19 LAB — TSH: TSH: 1.438 u[IU]/mL (ref 0.350–4.500)

## 2012-11-19 LAB — HEMOGLOBIN A1C: Hgb A1c MFr Bld: 9.4 % — ABNORMAL HIGH (ref <5.7)

## 2012-11-19 MED ORDER — ASPIRIN 81 MG PO TBEC: 81.0000 mg | DELAYED_RELEASE_TABLET | Freq: Every day | ORAL | Status: DC

## 2012-11-19 MED ORDER — DEXTROSE 50 % IV SOLN
0.5000 | Freq: Once | INTRAVENOUS | Status: AC
  Administered 2012-11-19: 25 mL via INTRAVENOUS

## 2012-11-19 MED ORDER — DEXTROSE 50 % IV SOLN
INTRAVENOUS | Status: AC
  Filled 2012-11-19: qty 50

## 2012-11-19 MED ORDER — INSULIN ASPART 100 UNIT/ML ~~LOC~~ SOLN: 25.0000 [IU] | Freq: Three times a day (TID) | SUBCUTANEOUS | Status: DC

## 2012-11-19 MED ORDER — CYCLOBENZAPRINE HCL 5 MG PO TABS: 5.0000 mg | ORAL_TABLET | Freq: Three times a day (TID) | ORAL | Status: DC | PRN

## 2012-11-19 MED ORDER — INSULIN GLARGINE 100 UNIT/ML ~~LOC~~ SOLN: 50.0000 [IU] | Freq: Every day | SUBCUTANEOUS | Status: DC

## 2012-11-19 NOTE — Progress Notes (Signed)
IV removed, site WNL.  Pt given d/c instructions and new prescriptions.  Discussed home care with patient and discussed home medications, patient verbalizes understanding, teachback completed. F/U appointments in place, pt states they will keep appointment. Pt is stable at this time. Pt taken to main entrance in wheelchair by staff member.

## 2012-11-19 NOTE — Progress Notes (Signed)
*  PRELIMINARY RESULTS* Echocardiogram 2D Echocardiogram has been performed.  [name] 11/19/2012, 11:23 AM

## 2012-11-19 NOTE — Progress Notes (Signed)
Patient c/o chest pain during night.  EKG performed and cardiac enzymes and nitro given per MD order.  Pain has had chest pain for the last few days.  Patient states that pain is mainly a dull pain, but at times is sharp and radiates down right arm and leg.  Dr. Rito Ehrlich notified of these events.  Awaiting results of cardiac tests in am.

## 2012-11-19 NOTE — Progress Notes (Addendum)
Stress Lab Nurses Notes - Jeani Hawking  TAKODA SIEDLECKI 11/19/2012 Reason for doing test: Chest Pain Type of test: Regular GTX / Inpatient Room 312 Nurse performing test: Parke Poisson, RN Nuclear Medicine Tech: Not Applicable Echo Tech: Not Applicable MD performing test: R. Ojani Berenson & Joni Reining NP Family MD: [facility] explained and consent signed: yes IV started: No redness or edema and Saline lock from floor Symptoms: SOB & arm Discomfort. Treatment/Intervention: None Reason test stopped: fatigue and reached target HR After recovery IV was: No redness or edema Patient to return to Nuc. Med at :NA Patient discharged: Transported back to room 312 via WC Patient's Condition upon discharge was: stable Comments: During test peak BP 148/68 & HR 151.  Recovery peak BP 168/70 & resting BP 118/68 & HR 95.  Symptoms resolved in recovery. Erskine Speed T  Graded Exercise Test-Interpretation      Graded exercise performed to a work load of 9.9 METs and a heart rate of 151 bpm, 86% of age-predicted maximum.  Exercise discontinued due to fatigue; no chest discomfort reported.  Blood pressure increased from a resting value of 90/64 to 150/70 at peak exercise, and normal response.  No arrhythmias noted.  EKG: normal sinus rhythm; within normal limits. Stress EKG:  No significant change Impression:  Negative and adequate graded exercise test without symptoms to suggest myocardial ischemia or exercise induced EKG abnormalities.  Other findings as noted.  Juana Di­az Bing, M.D.

## 2012-11-19 NOTE — Progress Notes (Signed)
UR Chart Review Completed  

## 2012-11-19 NOTE — Progress Notes (Signed)
  RD consulted for nutrition education regarding diabetes.   Lab Results  Component Value Date   HGBA1C 9.4* 11/18/2012    RD provided "Carbohydrate Counting for People with Diabetes" handout from the Academy of Nutrition and Dietetics. Discussed different food groups and their effects on blood sugar, emphasizing carbohydrate-containing foods. Provided list of carbohydrates and recommended serving sizes of common foods.  Discussed importance of controlled and consistent carbohydrate intake throughout the day. Provided examples of ways to balance meals/snacks and encouraged intake of high-fiber, whole grain complex carbohydrates. Teach back method used.  Expect fair compliance.  Body mass index is 52.01 kg/(m^2). Pt meets criteria for extreme obesity Class III based on current BMI.  Current diet order is CHO Modified , patient is consuming approximately 100% of meals at this time. Labs and medications reviewed. No further nutrition interventions warranted at this time. RD contact information provided. If additional nutrition issues arise, please re-consult RD.  #[phone]

## 2012-11-19 NOTE — Discharge Summary (Signed)
Physician Discharge Summary  [name]:[id number] DOB: Nov 23, 1966 DOA: 11/18/2012  PCP: Milinda Antis, MD  Admit date: 11/18/2012 Discharge date: 11/19/2012  Time spent: 35 minutes  Recommendations for Outpatient Follow-up:  Follow up with primary care doctor in 2 weeks  Discharge Diagnoses:  Principal Problem:  *Chest pain likely musculoskeletal Active Problems:  HYPERLIPIDEMIA  OBESITY  Diabetes mellitus type II, uncontrolled  Hypertension   Discharge Condition: improved  Diet recommendation: low carb, low salt  Filed Weights   11/18/12 0911 11/18/12 1553  Weight: 104.327 kg (230 lb) 105.235 kg (232 lb)    History of present illness:  [name] is a 45 y.o. female with a history of obesity, insulin-dependent diabetes, hypertension. Patient having chest pain for the last few months. She is noticeably, regular the last 2 weeks and worse over the past few days. She reports having right-sided pain which radiates up into her jaw, into her right arm, and up the right side of her face. She reports as a pressure type pain. This occurs at rest and on exertion. There is associated shortness of breath. She reports having both hot and cold spells during these episodes. Does not seem to be any relation to food. No cough or fever. No nausea or lightheadedness. She was seen by the [place] cardiology and recommendations were for echocardiogram and stress test. Unfortunately her symptoms recurred and she went to her primary care physician. Due to the recurrent nature of her symptoms, she was sent to the ER for evaluation. It was felt appropriate to admit the patient and have this workup done while she is in the hospital.   Hospital Course:  This lady was admitted to the hospital with chest pain. She ruled out for acute myocardial infarction with negative cardiac markers. She underwent 2-D echocardiogram was found to be unremarkable. Exercise stress test was also done, this was  discussed with Dr. Dietrich Pates, and it was felt that the patient's test was unremarkable. She was clear for discharge home from cardiology standpoint. Regarding her diabetes, I have recommended for her to continue to monitor her blood sugars at home. If they're consistently running above 200 throughout the day, I have recommended that she increase her Lantus to 60 units each bedtime. We will start her on prophylactic aspirin due to her multiple cardiac risk factors. Her pain is likely musculoskeletal in origin. She'll followup with her primary care physician in one to 2 weeks for post hospital followup  Procedures:  Exercise tolerance test reportedly unremarkable  Consultations:  none  Discharge Exam: Filed Vitals:   11/18/12 2237 11/19/12 0040 11/19/12 0514 11/19/12 1400  BP: 146/76 105/59 91/53 105/57  Pulse: 87 73 71 76  Temp:   98.1 F (36.7 C) 97.7 F (36.5 C)  TempSrc:   Oral Oral  Resp:   18 18  Height:      Weight:      SpO2:  99% 99% 98%    General: NAD Cardiovascular: s1, s2, rrr Respiratory: cta b  Discharge Instructions  Discharge Orders    Future Appointments: Provider: Department: Dept Phone: Center:   11/20/2012 11:00 AM [facility] Stress Lab [facility] [phone] [facility]   11/20/2012 11:30 AM [facility] Echo Lab [facility] [phone] None   11/24/2012 10:30 AM Salley Scarlet, MD [facility] [phone] [facility]   11/25/2012 11:15 AM Kathlen Brunswick, MD [facility] [phone] [facility]   12/15/2012 8:45 AM Carlus Pavlov, MD [facility] [phone] None     Future Orders Please Complete By Expires   Diet - low sodium heart healthy      Increase activity slowly      Call MD for:  temperature >100.4      Call MD for:  severe uncontrolled pain      Call MD for:  difficulty breathing, headache or visual disturbances          Medication List     As  of 11/19/2012  6:28 PM    TAKE these medications         ascorbic acid 250 MG Chew   Commonly known as: VITAMIN C   Chew 500 mg by mouth daily.      aspirin 81 MG EC tablet   Take 1 tablet (81 mg total) by mouth daily.      clotrimazole-betamethasone cream   Commonly known as: LOTRISONE   Apply 1 application topically 2 (two) times daily as needed. Apply to affected area 2 times daily for rash      cyclobenzaprine 5 MG tablet   Commonly known as: FLEXERIL   Take 1 tablet (5 mg total) by mouth 3 (three) times daily as needed for muscle spasms.      gabapentin 300 MG capsule   Commonly known as: NEURONTIN   Take 1 capsule (300 mg total) by mouth 3 (three) times daily.      insulin aspart 100 UNIT/ML injection   Commonly known as: novoLOG   Inject 25 Units into the skin 3 (three) times daily with meals.      insulin glargine 100 UNIT/ML injection   Commonly known as: LANTUS   Inject 50 Units into the skin at bedtime.      lisinopril 2.5 MG tablet   Commonly known as: PRINIVIL,ZESTRIL   Take 1 tablet (2.5 mg total) by mouth daily. For kidney protection      megestrol 40 MG tablet   Commonly known as: MEGACE   Take 40 mg by mouth daily as needed. Patient takes as needed when period is on      metFORMIN 500 MG tablet   Commonly known as: GLUCOPHAGE   Take 500 mg by mouth 2 (two) times daily with a meal.      nystatin 100000 UNIT/GM Powd   Apply 1 g topically 3 (three) times daily as needed. For rash           Follow-up Information    Follow up with Milinda Antis, MD. Schedule an appointment as soon as possible for a visit in 2 weeks.   Contact information:   [address] [place] [address] [phone]           The results of significant diagnostics from this hospitalization (including imaging, microbiology, ancillary and laboratory) are listed below for reference.    Significant Diagnostic Studies: Dg Chest Portable 1 View  11/18/2012   *RADIOLOGY REPORT*  Clinical Data: Chest pain, hyperglycemia  PORTABLE CHEST - 1 VIEW  Comparison: 01/27/2012; 05/08/2011; chest CT - 11/05/2011  Findings: Grossly unchanged cardiac silhouette and mediastinal contours.  Improved aeration of the bilateral lung bases.  No new focal airspace opacities.  No definite pleural effusion or pneumothorax.  Unchanged bones.  IMPRESSION: No acute cardiopulmonary disease.   Original Report Authenticated By: Tacey Ruiz, MD     Microbiology: No results found for this or any previous visit (from the past 240 hour(s)).   Labs: Basic  Metabolic Panel:  Lab 11/18/12 4782  NA 136  K 4.2  CL 100  CO2 28  GLUCOSE 251*  BUN 7  CREATININE 0.82  CALCIUM 9.0  MG --  PHOS --   Liver Function Tests:  Lab 11/18/12 1015  AST 17  ALT 25  ALKPHOS 104  BILITOT 0.5  PROT 6.8  ALBUMIN 3.3*    Lab 11/18/12 1015  LIPASE 36  AMYLASE --   No results found for this basename: AMMONIA:5 in the last 168 hours CBC:  Lab 11/18/12 1015  WBC 7.5  NEUTROABS 5.0  HGB 12.9  HCT 37.6  MCV 82.1  PLT 273   Cardiac Enzymes:  Lab 11/19/12 0516 11/19/12 0008 11/18/12 1845 11/18/12 1015  CKTOTAL -- -- -- --  CKMB -- -- -- --  CKMBINDEX -- -- -- --  TROPONINI <0.30 <0.30 <0.30 <0.30   BNP: BNP (last 3 results) No results found for this basename: PROBNP:3 in the last 8760 hours CBG:  Lab 11/19/12 1620 11/19/12 1119 11/19/12 1001 11/19/12 0847 11/19/12 0710  GLUCAP 181* 76 95 122* 134*       Signed:  MEMON,JEHANZEB  [facility] 11/19/2012, 6:28 PM

## 2012-11-20 ENCOUNTER — Telehealth: Payer: Self-pay | Admitting: Family Medicine

## 2012-11-20 ENCOUNTER — Other Ambulatory Visit (HOSPITAL_COMMUNITY): Payer: No Typology Code available for payment source

## 2012-11-20 ENCOUNTER — Encounter (HOSPITAL_COMMUNITY): Payer: No Typology Code available for payment source

## 2012-11-20 DIAGNOSIS — Z79899 Other long term (current) drug therapy: Secondary | ICD-10-CM

## 2012-11-20 NOTE — Telephone Encounter (Signed)
Called pt. this am. She was d/c yesterday night. She r/o for MI and a 2D ECHO and stress test were negative. CP deemed musculoskeletal.  - Noticed sugars in the hospital great yesterday as she was NPO.  - This am sugar 240. Advised her to increase Lantus to 50 units at night if sugars stay in the 200s in next 2 days. - She tells me again that she is interested in an insulin pump - her husband has one and works for him (he has DM2). They have been to education classes together and she feels that she can manage the pump. I agreed to refer her to DM Education for this. I talked to Nancie Neas (Diabetes Educator with [employer]) and she will call the pt and then check with her insurance if she can get approved. I will also place the referral for pre-pump training with [facility].

## 2012-11-20 NOTE — Telephone Encounter (Signed)
I called to check on pt, was discharged after cardiac rule out. Normal stress and Echocardiogram Spoke with pt still has some pain in her chest and neck Would like B12 levels checked Spoke to her endocrinologist for her diabetes and was given instruction Advised her to use the flexeril  Recheck Monday

## 2012-11-20 NOTE — Addendum Note (Signed)
Addended by: Carlus Pavlov on: 11/20/2012 11:37 AM   Modules accepted: Orders

## 2012-11-20 NOTE — Telephone Encounter (Signed)
Pt has not responded to attempts to contact to schedule appointment. Referral filed.  Chart reviewed. Noted that pt was d/c from [facility] on 11/19/12. Per MD notes, pt and husband have previously taken DM education classes. Pt desires an insulin pump and will be contacted by CDE at [employer] to see if her insurance covers the pump and will be set up for pump training at [facility] in [place].

## 2012-11-21 ENCOUNTER — Telehealth: Payer: Self-pay | Admitting: *Deleted

## 2012-11-21 ENCOUNTER — Other Ambulatory Visit: Payer: Self-pay | Admitting: Family Medicine

## 2012-11-21 NOTE — Telephone Encounter (Signed)
Office notes and forms from [place] faxed to Insulin  Pump Specialist., Darryl Lent.

## 2012-11-22 LAB — VITAMIN B12: Vitamin B-12: 267 pg/mL (ref 211–911)

## 2012-11-24 ENCOUNTER — Encounter: Payer: Self-pay | Admitting: Family Medicine

## 2012-11-24 ENCOUNTER — Ambulatory Visit (INDEPENDENT_AMBULATORY_CARE_PROVIDER_SITE_OTHER): Payer: No Typology Code available for payment source | Admitting: Family Medicine

## 2012-11-24 VITALS — BP 138/78 | HR 76 | Resp 18 | Ht <= 58 in | Wt 231.1 lb

## 2012-11-24 DIAGNOSIS — IMO0001 Reserved for inherently not codable concepts without codable children: Secondary | ICD-10-CM

## 2012-11-24 DIAGNOSIS — G589 Mononeuropathy, unspecified: Secondary | ICD-10-CM

## 2012-11-24 DIAGNOSIS — IMO0002 Reserved for concepts with insufficient information to code with codable children: Secondary | ICD-10-CM

## 2012-11-24 DIAGNOSIS — E785 Hyperlipidemia, unspecified: Secondary | ICD-10-CM

## 2012-11-24 DIAGNOSIS — G629 Polyneuropathy, unspecified: Secondary | ICD-10-CM

## 2012-11-24 DIAGNOSIS — I10 Essential (primary) hypertension: Secondary | ICD-10-CM

## 2012-11-24 DIAGNOSIS — R079 Chest pain, unspecified: Secondary | ICD-10-CM

## 2012-11-24 DIAGNOSIS — E1165 Type 2 diabetes mellitus with hyperglycemia: Secondary | ICD-10-CM

## 2012-11-24 MED ORDER — OXYCODONE-ACETAMINOPHEN 5-325 MG PO TABS: 1.0000 | ORAL_TABLET | Freq: Two times a day (BID) | ORAL | Status: DC | PRN

## 2012-11-24 MED ORDER — NITROGLYCERIN 0.4 MG SL SUBL: 0.4000 mg | SUBLINGUAL_TABLET | SUBLINGUAL | Status: DC | PRN

## 2012-11-24 NOTE — Assessment & Plan Note (Signed)
LDL is at goal, but pt not on statin therapy, will await her cards visit, then start low dose statin

## 2012-11-24 NOTE — Assessment & Plan Note (Signed)
[name] continues to have CP despite neg Echo and stress test, no improvement with treatment for MSK strain. Will give script for nitroglycerin, Short course percocet given as this helped in hospital, plan to get earlier appt with Dr. Allyson Sabal for any recommendations,[name] may need catherization

## 2012-11-24 NOTE — Assessment & Plan Note (Signed)
Blood sugars past 24-48 hours have been < 200, defer to endocrine

## 2012-11-24 NOTE — Patient Instructions (Addendum)
New pain medication use twice a day as needed Nitroglycerin for severe chest pain until seen by heart doctor We will reschedule with Dr. Allyson Sabal for earlier appointment Gabapentin increase to 600mg  at bedtime x 1 week, continue 300mg  in morning and evening  F/U 3 months

## 2012-11-24 NOTE — Progress Notes (Signed)
  Subjective:    Patient ID: [name], female    DOB: 1966/08/25, 46 y.o.   MRN: [id number]  HPI  Patient here to follow possible [place] she was seen in my office last week however have chest pain therefore sent the ED for evaluation she was admitted for 24-hour observation. Cardiac enzymes were negative echocardiogram and stress test were unremarkable chest x-ray negative. She was discharged with Flexeril for muscle skeletal pain and she's been taking 3 times a day as needed however this has not helped. She continues to fill a heavy pressure sitting on her chest. Unable to sleep due to pain, waves of pain on and off throughout day Endocrinologist has her sliding scale with her lantus, planning to get insulin pump   Review of Systems  GEN- denies fatigue, fever, weight loss,weakness, recent illness HEENT- denies eye drainage, change in vision, nasal discharge, CVS- + chest pain, palpitations RESP- + SOB, cough, wheeze ABD- denies N/V, change in stools, abd pain GU- denies dysuria, hematuria, dribbling, incontinence MSK- denies joint pain, muscle aches, injury Neuro- denies headache, dizziness, syncope, seizure activity, + tingling feet, pain in feet worse at night      Objective:   Physical Exam  GEN- NAD, alert and oriented x3 HEENT- PERRL, EOMI, non injected sclera, pink conjunctiva, MMM, oropharynx clear Neck- Supple, no thyromegaly, normal ROM, no spasm noted CVS- RRR, no murmur RESP-CTAB EXT- + trace pedal edema Pulses- Radial, DP- 2+ MSK- rotator cuff in tact bilat, good ROM shoulder Chest wall- mild TTP right chest wall   CBG 179 after eating       Assessment & Plan:

## 2012-11-24 NOTE — Assessment & Plan Note (Signed)
Her BP was a little elevated today, but in past has bene low to normotensive, BP did drop during admission, will not change low dose ACEI

## 2012-11-24 NOTE — Assessment & Plan Note (Signed)
Increase gabapentin to total 1200mg , will continue to titrate up

## 2012-11-25 ENCOUNTER — Telehealth: Payer: Self-pay | Admitting: Family Medicine

## 2012-11-25 ENCOUNTER — Ambulatory Visit: Payer: No Typology Code available for payment source | Admitting: Cardiology

## 2012-11-25 NOTE — Telephone Encounter (Signed)
Patient states that it is only with exertion that she has the shortness of breath ( with going up and down steps, getting out of the tub, putting on clothes).  Please advise.

## 2012-11-25 NOTE — Telephone Encounter (Signed)
Please advise 

## 2012-11-25 NOTE — Telephone Encounter (Signed)
If she cant breath she has to go to ER, there is nothing to do at home If she has Chest pain , try the Nitroglycerin

## 2012-11-25 NOTE — Telephone Encounter (Signed)
If she worsens see previous message Otherwise follow-up with heart doctor next week as scheduled

## 2012-11-26 NOTE — Telephone Encounter (Signed)
Noted  

## 2012-11-27 ENCOUNTER — Telehealth: Payer: Self-pay | Admitting: Family Medicine

## 2012-11-28 ENCOUNTER — Other Ambulatory Visit: Payer: Self-pay

## 2012-11-28 MED ORDER — CYCLOBENZAPRINE HCL 5 MG PO TABS: 5.0000 mg | ORAL_TABLET | Freq: Three times a day (TID) | ORAL | Status: DC | PRN

## 2012-11-28 MED ORDER — GABAPENTIN 300 MG PO CAPS: 300.0000 mg | ORAL_CAPSULE | Freq: Three times a day (TID) | ORAL | Status: DC

## 2012-11-28 NOTE — Telephone Encounter (Signed)
meds refilled 

## 2012-12-04 ENCOUNTER — Telehealth: Payer: Self-pay | Admitting: Internal Medicine

## 2012-12-04 NOTE — Telephone Encounter (Signed)
The patient called hoping to have an endocrinology nurse call her at their earliest convenience. [phone]

## 2012-12-05 ENCOUNTER — Ambulatory Visit: Payer: No Typology Code available for payment source | Admitting: Adult Health

## 2012-12-05 ENCOUNTER — Other Ambulatory Visit: Payer: Self-pay | Admitting: *Deleted

## 2012-12-08 ENCOUNTER — Encounter (HOSPITAL_COMMUNITY): Payer: Self-pay | Admitting: Respiratory Therapy

## 2012-12-11 ENCOUNTER — Telehealth: Payer: Self-pay

## 2012-12-11 ENCOUNTER — Ambulatory Visit (HOSPITAL_COMMUNITY)
Admission: RE | Admit: 2012-12-11 | Discharge: 2012-12-11 | Disposition: A | Discharge status: Home / Self Care | Payer: BC Managed Care – PPO | Source: Ambulatory Visit | Attending: Cardiovascular Disease | Admitting: Cardiovascular Disease

## 2012-12-11 ENCOUNTER — Encounter (HOSPITAL_COMMUNITY)
Admission: RE | Disposition: A | Discharge status: Home / Self Care | Payer: Self-pay | Source: Ambulatory Visit | Attending: Cardiovascular Disease

## 2012-12-11 ENCOUNTER — Telehealth: Payer: Self-pay | Admitting: Internal Medicine

## 2012-12-11 ENCOUNTER — Encounter (INDEPENDENT_AMBULATORY_CARE_PROVIDER_SITE_OTHER): Payer: No Typology Code available for payment source | Admitting: Ophthalmology

## 2012-12-11 DIAGNOSIS — IMO0002 Reserved for concepts with insufficient information to code with codable children: Secondary | ICD-10-CM

## 2012-12-11 DIAGNOSIS — R0989 Other specified symptoms and signs involving the circulatory and respiratory systems: Secondary | ICD-10-CM | POA: Insufficient documentation

## 2012-12-11 DIAGNOSIS — R0609 Other forms of dyspnea: Secondary | ICD-10-CM | POA: Insufficient documentation

## 2012-12-11 DIAGNOSIS — E118 Type 2 diabetes mellitus with unspecified complications: Secondary | ICD-10-CM

## 2012-12-11 DIAGNOSIS — R0789 Other chest pain: Secondary | ICD-10-CM | POA: Insufficient documentation

## 2012-12-11 HISTORY — PX: LEFT HEART CATHETERIZATION WITH CORONARY ANGIOGRAM: SHX5451

## 2012-12-11 HISTORY — PX: CARDIAC CATHETERIZATION: SHX172

## 2012-12-11 LAB — GLUCOSE, CAPILLARY: Glucose-Capillary: 98 mg/dL (ref 70–99)

## 2012-12-11 SURGERY — LEFT HEART CATHETERIZATION WITH CORONARY ANGIOGRAM
Anesthesia: LOCAL

## 2012-12-11 MED ORDER — SODIUM CHLORIDE 0.9 % IV SOLN: INTRAVENOUS | Status: AC

## 2012-12-11 MED ORDER — FENTANYL CITRATE 0.05 MG/ML IJ SOLN
INTRAMUSCULAR | Status: AC
  Filled 2012-12-11: qty 2

## 2012-12-11 MED ORDER — HEPARIN (PORCINE) IN NACL 2-0.9 UNIT/ML-% IJ SOLN
INTRAMUSCULAR | Status: AC
  Filled 2012-12-11: qty 1000

## 2012-12-11 MED ORDER — INSULIN ASPART 100 UNIT/ML ~~LOC~~ SOLN: SUBCUTANEOUS | Status: DC

## 2012-12-11 MED ORDER — ONDANSETRON HCL 4 MG/2ML IJ SOLN: 4.0000 mg | Freq: Four times a day (QID) | INTRAMUSCULAR | Status: DC | PRN

## 2012-12-11 MED ORDER — HEPARIN SODIUM (PORCINE) 1000 UNIT/ML IJ SOLN
INTRAMUSCULAR | Status: AC
  Filled 2012-12-11: qty 1

## 2012-12-11 MED ORDER — VERAPAMIL HCL 2.5 MG/ML IV SOLN
INTRAVENOUS | Status: AC
  Filled 2012-12-11: qty 2

## 2012-12-11 MED ORDER — INSULIN ASPART 100 UNIT/ML ~~LOC~~ SOLN: 0.0000 [IU] | SUBCUTANEOUS | Status: DC

## 2012-12-11 MED ORDER — NITROGLYCERIN 0.4 MG SL SUBL
SUBLINGUAL_TABLET | SUBLINGUAL | Status: AC
  Filled 2012-12-11: qty 25

## 2012-12-11 MED ORDER — SODIUM CHLORIDE 0.9 % IJ SOLN: 3.0000 mL | INTRAMUSCULAR | Status: DC | PRN

## 2012-12-11 MED ORDER — SODIUM CHLORIDE 0.9 % IV SOLN
INTRAVENOUS | Status: DC
  Administered 2012-12-11: 08:00:00 via INTRAVENOUS

## 2012-12-11 MED ORDER — NITROGLYCERIN 0.4 MG SL SUBL
0.4000 mg | SUBLINGUAL_TABLET | SUBLINGUAL | Status: DC | PRN
  Administered 2012-12-11: 0.4 mg via SUBLINGUAL

## 2012-12-11 MED ORDER — ASPIRIN 81 MG PO CHEW
CHEWABLE_TABLET | ORAL | Status: AC
  Filled 2012-12-11: qty 4

## 2012-12-11 MED ORDER — ACETAMINOPHEN 325 MG PO TABS: 650.0000 mg | ORAL_TABLET | ORAL | Status: DC | PRN

## 2012-12-11 MED ORDER — MIDAZOLAM HCL 2 MG/2ML IJ SOLN
INTRAMUSCULAR | Status: AC
  Filled 2012-12-11: qty 2

## 2012-12-11 MED ORDER — NITROGLYCERIN 0.2 MG/ML ON CALL CATH LAB
INTRAVENOUS | Status: AC
  Filled 2012-12-11: qty 1

## 2012-12-11 MED ORDER — FENTANYL CITRATE 0.05 MG/ML IJ SOLN
25.0000 ug | Freq: Once | INTRAMUSCULAR | Status: AC
  Administered 2012-12-11: 25 ug via INTRAVENOUS

## 2012-12-11 MED ORDER — LIDOCAINE HCL (PF) 1 % IJ SOLN
INTRAMUSCULAR | Status: AC
  Filled 2012-12-11: qty 30

## 2012-12-11 MED ORDER — DIAZEPAM 5 MG PO TABS
5.0000 mg | ORAL_TABLET | ORAL | Status: AC
  Administered 2012-12-11: 5 mg via ORAL
  Filled 2012-12-11: qty 1

## 2012-12-11 NOTE — Telephone Encounter (Signed)
The patient called to ask that Dr. Elvera Lennox write a new rx for her Novolog insulin.  The patient is now on an insulin pump and will be using at least 4 vials per month, and the current rx is written for only one vial per month. The patient uses the [facility] in [place]. Please call the patient at [phone] with any questions.

## 2012-12-11 NOTE — Telephone Encounter (Signed)
Sent new Rx to [facility] in [place] - 40 mL with 3 refills of Novolog.

## 2012-12-11 NOTE — Telephone Encounter (Signed)
rx faxed to [facility] [place]

## 2012-12-11 NOTE — Telephone Encounter (Signed)
Pt called stating she was placed on insulin pump last week, but was only given 1 bottle/refill per month on insulin, pt states she is going to run out would like to get 4 bottles per month, pt uses [facility] [place], pt's # [id number]

## 2012-12-11 NOTE — Op Note (Signed)
[name] is a 47 y.o. female    [id number] LOCATION:  FACILITY: [facility]  PHYSICIAN: Nanetta Batty, M.D. Apr 19, 1966   DATE OF PROCEDURE:  12/11/2012  DATE OF DISCHARGE:  [facility]  CARDIAC CATHETERIZATION     History obtained from chart review. [name] is a 47 year old moderately overweight married Caucasian female with 2 children he is just finishing nursing school as presenting here for outpatient diagnostic coronary arteriography to define her anatomy because of ongoing right-sided chest pain radiating to right arm associated with increasing dyspnea on exertion. She had normal 2-D echo and Myoview performed recently.   PROCEDURE DESCRIPTION:    The patient was brought to the second floor  [place] [facility]iac cath lab in the postabsorptive state. She was  premedicated with Valium 5 mg by mouth, IV Versed.. Her right wrist was prepped and shaved in usual sterile fashion. Xylocaine 1% was used for local anesthesia. A 5 French sheath was inserted into the right radial artery using standard Seldinger technique. The patient received 5000 units  of heparin  intravenously.  Using a 5 [place] TIG catheter along with a 5 [place] JL 3.5 diagnostic catheter and 5 French pigtail catheter coronary angiography left ventriculography was  Performed. Visipaque dye was used for the entirety of the case. Retrograde aortic, ventricular and pullback pressures were recorded. A total of 65 cc of contrast was administered to the patient.    HEMODYNAMICS:    AO SYSTOLIC/AO DIASTOLIC: 136/92   LV SYSTOLIC/LV DIASTOLIC: 131/14  ANGIOGRAPHIC RESULTS:   1. Left main; normal  2. LAD; normal 3. Left circumflex; normal.  4. Right coronary artery; dominant and normal 5. Left ventriculography; RAO left ventriculogram was performed using  25 mL of Visipaque dye at 12 mL/second. The overall LVEF estimated  60 % Without wall motion abnormalities  IMPRESSION:[name] has normal  coronary arteries and normal ventricular function. I believe her chest pain is noncardiac. The guidewire and catheter were removed. The sheath was removed and a  TR band was placed on the right wrist and inflated with 10 cc to achieve  patent hemostasis. The patient left the Cath Lab in stable condition. She will be discharged as an outpatient and will see me back in the office in one to 2 weeks for followup.  Runell Gess MD, [facility] 12/11/2012 12:37 PM

## 2012-12-11 NOTE — H&P (Signed)
  H & P will be scanned in.  Pt was reexamined and existing H & P reviewed. No changes found.  Runell Gess, MD [facility] 12/11/2012 12:01 PM

## 2012-12-12 ENCOUNTER — Telehealth: Payer: Self-pay | Admitting: Internal Medicine

## 2012-12-12 DIAGNOSIS — IMO0002 Reserved for concepts with insufficient information to code with codable children: Secondary | ICD-10-CM

## 2012-12-12 DIAGNOSIS — E1165 Type 2 diabetes mellitus with hyperglycemia: Secondary | ICD-10-CM

## 2012-12-12 DIAGNOSIS — E118 Type 2 diabetes mellitus with unspecified complications: Secondary | ICD-10-CM

## 2012-12-12 MED ORDER — INSULIN ASPART 100 UNIT/ML ~~LOC~~ SOLN
SUBCUTANEOUS | Status: DC
Start: 2012-12-12 — End: 2013-09-17

## 2012-12-12 NOTE — Telephone Encounter (Signed)
Dr. Elvera Lennox will call pt re: insulin

## 2012-12-12 NOTE — Telephone Encounter (Signed)
The patient called to report that pharmacy still does not have the fax.  Please call the pharmacy with insulin rx.  The patient uses [facility] in [place].

## 2012-12-12 NOTE — Telephone Encounter (Signed)
I tried to call her insulin Rx in, but nobody picking up the phone at the pharmacy and did not want to leave message.

## 2012-12-15 ENCOUNTER — Ambulatory Visit (INDEPENDENT_AMBULATORY_CARE_PROVIDER_SITE_OTHER): Payer: BC Managed Care – PPO | Admitting: Internal Medicine

## 2012-12-15 ENCOUNTER — Encounter: Payer: Self-pay | Admitting: Internal Medicine

## 2012-12-15 VITALS — BP 100/50 | HR 97 | Temp 98.1°F | Resp 16 | Ht 62.0 in | Wt 233.0 lb

## 2012-12-15 DIAGNOSIS — IMO0001 Reserved for inherently not codable concepts without codable children: Secondary | ICD-10-CM

## 2012-12-15 DIAGNOSIS — IMO0002 Reserved for concepts with insufficient information to code with codable children: Secondary | ICD-10-CM

## 2012-12-15 DIAGNOSIS — E1165 Type 2 diabetes mellitus with hyperglycemia: Secondary | ICD-10-CM

## 2012-12-15 MED ORDER — GLUCAGON (RDNA) 1 MG IJ KIT: PACK | INTRAMUSCULAR | Status: DC

## 2012-12-15 NOTE — Progress Notes (Signed)
Subjective:     Patient ID: [name], female   DOB: 02-28-1966, 47 y.o.   MRN: [id number]  HPI Pt. returns for for follow up of DM2, insulin-dependent,uncontrolled, with complications (neuropathy), dx last Spring. Last visit was 1 month ago. Since I last saw her, we obtained her an insulin pump, which she started 1 week ago. She had a heart catheterization last week by Dr. Allyson Sabal. The results were reportedly good and she was told to see a neurologist for her neck pain.   She was on: - Lantus 50 units qhs  - Novolog 25 units for each of the three meals.  - Metformin 1000 mg bid.  Now she on a [employer] MiniMed 530G pump, but not yet connected to the Enlite sensor. Per review of her pump (she is not sure how to access the settings and dose not know them yet), her settings are: - basal rates: 1.2 units/h >> 28.8 units/day - bolus wizard on - active insulin time:5 h - target CBG: 150 - ICR: 1:7.5 - ISF: 30 as set by Nancie Neas 7 days ago. She will be connected to the CGM in 1 mo. Bolus history: from 3-9 mostly around 6-7 units.  She checks her sugars 6x a day (target 8-10 x a day) - does not bring log: - am: 190-200 prev. 110-180 - pre-lunch: 190-200s, prev. 140-180 - pre-dinner: approx the same No CBGs lower than 190.  Last HbA1C: Lab Results  Component Value Date   HGBA1C 9.4* 11/18/2012   Last foot exam was 2 mo ago with Dr. Jeanice Lim. No diabetic nephropathy. She has neuropathy, for which she was started on Gabapentin qhs by Dr. Jeanice Lim. Last eye exam was 2 years ago, but will see Dr. Jerolyn Center, next week.   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 Takes Megace as needed for bleeding.  Review of Systems Constitutional: no weight gain/loss, no fatigue Eyes: occasional blurry vision, no xerophthalmia Cardiovascular: continues to have CP/no SOB, palpitations, or leg swelling Respiratory: no cough/SOB Gastrointestinal: no N/V/D/C Neurological: no tremors/numbness/tingling/dizziness  Objective:   Physical Exam BP 100/50  Pulse 97  Temp 98.1 F (36.7 C) (Oral)  Resp 16  Ht 5\' 2"  (1.575 m)  Wt 233 lb (105.688 kg)  BMI 42.62 kg/m2  SpO2 97% Wt Readings from Last 3 Encounters:  12/15/12 233 lb (105.688 kg)  12/11/12 198 lb (89.812 kg)  12/11/12 198 lb (89.812 kg)   Constitutional: obese, in NAD Eyes: PERRLA, EOMI, no exophthalmos ENT: moist mucous membranes, no thyromegaly, no cervical lymphadenopathy Cardiovascular: RRR, No MRG Respiratory: CTA B Gastrointestinal: abdomen soft, NT, ND, BS+ Musculoskeletal: no deformities, strength intact in all 4 Skin: moist, warm, no rashes Neurological: no tremor with outstretched hands, DTR normal in all 4  Assessment:     1. DM2 - insulin-dependent,  uncontrolled - now on insulin pump: [employer], MiniMed 530G pump     Plan:     1. DM2 - pt was recently started on an insulin pump MiniMed 530G pump (1 week ago). Her sugars are high as we started her on conservative doses of insulin. Since she has no lows and gets approx 29 units per day from basal doses and ~20-22 units per day from bolusing (from review of her bolus hx), will increase the amount of insulin she gets from boluses by changing - active ins. Time: 4 h - ISF: 20 - I advised her to join MyChart and let me know her sugars or any Q and concerns - will let Kriste Basque know the changes that we have done today.  - will away the CGM placement in a month, and  advised her to download pump to [facility] and let me know username and password so I can see her sugars, too, before next visit - continue Metformin - given glucagon Rx (per her request) and printed the Rx so she can see where is cheapest - advised to add alpha lipoic acid for neuropathy. She does take a MVI daily. Last level 267 this month. - I will see her back in 2 months

## 2012-12-15 NOTE — Patient Instructions (Addendum)
Please return in 2 months.  Basic Rules for Patients with Type I Diabetes Mellitus  1. The American Diabetes Association (ADA) recommended targets: - fasting sugar <130 - after meal sugar <180 - HbA1C <7%  2. Engage in ?150 min moderate exercise per week  3. Make sure you have ?8h of sleep every night as this helps both blood sugars and your weight.  4. Always keep a sugar log (not only record in your meter) and bring it to all appointments with [place].  5. If you are on a pump, know how to access the settings and to modify the parameters.  6.  Remember, you can always call the number on the back of the pump for emergencies related to the pump.  7. "15-15 rule" for hypoglycemia: if sugars are low, take 15 g of carbs** ("fast sugar" - e.g. 4 glucose tablets, 4 oz orange juice), wait 15 min, then check sugars again. If still <80, repeat. Continue  until your sugars >80, then eat a normal meal.   8. Teach family members and coworkers to inject glucagon. Have a glucagon set at home and one at work. They should call 911 after using the set.  9. If you are on a pump, set "insulin on board" time for 5 hours (if your sugars tend to be higher, can use 4 hours).   10. If you are on a pump, use the "dual wave bolus" setting for high fat foods (e.g. pizza). Start with a setting of 50%-50% (50% instant bolus and 50% prolonged bolus over 3h, for e.g.).    11. If you are on a pump, make sure the basal daily insulin dose is approximately equal (not larger) to the daily insulin you get from boluses, otherwise you are at risk for hypoglycemia.  12. Check sugar before driving. If <100, correct, and only start driving if sugars rise ?161. Check sugar every hour when on a long drive.  13. Check sugar before exercising. If <100, correct, and only start exercising if sugars rise ?100. Check sugar every hour when on a long exercise routine and 1h after you finished exercising.   If >250, check urine for  ketones. If you have moderate-large ketones in urine, do not start exercise. Hydrate yourself with clear liquids and correct the high sugar. Recheck sugars and ketones before attempting to exercise.  Be aware that you might need less insulin when exercising.  *intense, short, exercise bursts can increase your sugars, but  *less intense, longer (>1h), exercise routines can decrease your sugars.  If you are on a pump, you might need to decrease your basal rate by 10% or more (or even disconnect your pump) while you exercise to prevent low sugars. Do not disconnect your pump by more than 3 hours at a time! You also might need to decrease your insulin bolus for the meal prior to your exercise time by 20% or more.  14. Make sure you have a MedAlert bracelet or pendant mentioning "Type I Diabetes Mellitus". If you have a prior episode of severe hypoglycemia or hypoglycemia unawareness, it should also mention this.  15. Please do not walk barefoot. Inspect your feet for sores/cuts and let us know if you have them.  16. Please call [facility] with any questions and concerns [phone]).   **E.g. of "fast carbs":   first choice (15 g):  1 tube glucose gel, GlucoPouch 15, 2 oz glucose liquid   second choice (15-16 g):  3 or 4 glucose  tablets (best taken  with water), 15 Dextrose Bits chewable   third choice (15-20 g):   cup fruit juice,  cup regular soda, 1 cup skim milk,  1 cup sports drink   fourth choice (15-20 g):  1 small tube [place] gel (not frosting), 2 tbsp raisins, 1 tbsp table sugar,  candy, jelly beans, gum drops - check package for carb amount   (adapted from: Juluis Rainier. "Insulin therapy and hypoglycemia" Endocrinol Metab Clin N Am 2012, 41: 57-87)

## 2012-12-22 ENCOUNTER — Encounter (INDEPENDENT_AMBULATORY_CARE_PROVIDER_SITE_OTHER): Payer: BC Managed Care – PPO | Admitting: Ophthalmology

## 2012-12-22 ENCOUNTER — Ambulatory Visit (INDEPENDENT_AMBULATORY_CARE_PROVIDER_SITE_OTHER): Payer: BC Managed Care – PPO | Admitting: Family Medicine

## 2012-12-22 ENCOUNTER — Encounter: Payer: Self-pay | Admitting: Family Medicine

## 2012-12-22 VITALS — BP 134/76 | HR 100 | Temp 98.8°F | Resp 18 | Ht <= 58 in | Wt 236.1 lb

## 2012-12-22 DIAGNOSIS — F39 Unspecified mood [affective] disorder: Secondary | ICD-10-CM

## 2012-12-22 DIAGNOSIS — IMO0002 Reserved for concepts with insufficient information to code with codable children: Secondary | ICD-10-CM

## 2012-12-22 DIAGNOSIS — E1165 Type 2 diabetes mellitus with hyperglycemia: Secondary | ICD-10-CM

## 2012-12-22 DIAGNOSIS — E1139 Type 2 diabetes mellitus with other diabetic ophthalmic complication: Secondary | ICD-10-CM

## 2012-12-22 DIAGNOSIS — H2510 Age-related nuclear cataract, unspecified eye: Secondary | ICD-10-CM

## 2012-12-22 DIAGNOSIS — J019 Acute sinusitis, unspecified: Secondary | ICD-10-CM

## 2012-12-22 DIAGNOSIS — R6882 Decreased libido: Secondary | ICD-10-CM

## 2012-12-22 DIAGNOSIS — H43819 Vitreous degeneration, unspecified eye: Secondary | ICD-10-CM

## 2012-12-22 DIAGNOSIS — H3530 Unspecified macular degeneration: Secondary | ICD-10-CM

## 2012-12-22 DIAGNOSIS — E11319 Type 2 diabetes mellitus with unspecified diabetic retinopathy without macular edema: Secondary | ICD-10-CM

## 2012-12-22 DIAGNOSIS — IMO0001 Reserved for inherently not codable concepts without codable children: Secondary | ICD-10-CM

## 2012-12-22 MED ORDER — VENLAFAXINE HCL ER 37.5 MG PO CP24: 37.5000 mg | ORAL_CAPSULE | Freq: Every day | ORAL | Status: DC

## 2012-12-22 MED ORDER — DOXYCYCLINE HYCLATE 100 MG PO TABS: 100.0000 mg | ORAL_TABLET | Freq: Two times a day (BID) | ORAL | Status: DC

## 2012-12-22 NOTE — Progress Notes (Signed)
  Subjective:    Patient ID: [name], female    DOB: 1966-11-13, 47 y.o.   MRN: [id number]  HPI  Patient here to follow chronic medical problems. Diabetes mellitus she was started on insulin pump 2 weeks ago her blood sugars have been 200s to 300s.Seen by opthalmology, normal exam- Dr. Ashley Royalty   Chest pain she was evaluated by cardiology cardiac catheterization did not show any evidence of coronary artery disease she has followup within this week however she was referred to orthopedics thinking that the possible chest pain into the neck is a pinched nerve.   Sinus and head cold for the past week positive sick contacts no fever is a lot of pressure between her eyes and over her cheeks. No over-the-counter medications taken.  Decreased sexual libido for the past 6 months. She states it is affecting her relationship and she is more of a newlywed. She has no desire and when she does force herself to have intercourse she has pain and she is very tense.Are very depressed and she's not sure this coming from her diabetes or her mood. She also gets hot flashes on and off. She is very light cycle since she had her ablation but has not had a full cycle in approximately 3 months.   Review of Systems  GEN- denies fatigue, fever, weight loss,weakness, recent illness HEENT- denies eye drainage, change in vision, +nasal discharge, CVS- denies chest pain, palpitations RESP- denies SOB, cough, wheeze ABD- denies N/V, change in stools, abd pain GU- denies dysuria, hematuria, dribbling, incontinence, denies vaginal discharge MSK- denies joint pain, muscle aches, injury Neuro- denies headache, dizziness, syncope, seizure activity      Objective:   Physical Exam GEN- NAD, alert and oriented x3 HEENT- PERRL, EOMI, non injected sclera, pink conjunctiva, MMM, oropharynx clear , TM clear bilat no effusion, + maxillary sinus tenderness, inflammed turbinates,  Nasal drainage  Neck- Supple, no LAD CVS- RRR, no  murmur RESP-CTAB EXT- No edema Pulses- Radial 2+ Psych- crying during exam, not anxious appearing, depressed mood when discussing relationship, good eye contact        Assessment & Plan:

## 2012-12-22 NOTE — Patient Instructions (Signed)
Start antibiotics for sinus infection and nasal saline Start effexor, for mood  F/U 4 weeks

## 2012-12-23 ENCOUNTER — Telehealth: Payer: Self-pay | Admitting: Internal Medicine

## 2012-12-23 DIAGNOSIS — R6882 Decreased libido: Secondary | ICD-10-CM | POA: Insufficient documentation

## 2012-12-23 DIAGNOSIS — F39 Unspecified mood [affective] disorder: Secondary | ICD-10-CM | POA: Insufficient documentation

## 2012-12-23 DIAGNOSIS — J019 Acute sinusitis, unspecified: Secondary | ICD-10-CM | POA: Insufficient documentation

## 2012-12-23 DIAGNOSIS — IMO0002 Reserved for concepts with insufficient information to code with codable children: Secondary | ICD-10-CM

## 2012-12-23 DIAGNOSIS — E1165 Type 2 diabetes mellitus with hyperglycemia: Secondary | ICD-10-CM

## 2012-12-23 MED ORDER — LIDOCAINE HCL 4 % MT SOLN: OROMUCOSAL | Status: DC

## 2012-12-23 NOTE — Telephone Encounter (Signed)
The patient called to request rx for numbing spray for insulin pump injection changes to be called into the [facility] in [place].  The patient stated she left message on Dr. Charlean Sanfilippo nurse's voicemail yesterday and had not had a response.  The patient may be reached at [phone].

## 2012-12-23 NOTE — Assessment & Plan Note (Signed)
I believe this is multifactorial, she is in perimenopausal state, under a lot of stress, her health has also been challenging (DM uncontrolled) Start effexor per above, discussed need for quality time with husband

## 2012-12-23 NOTE — Telephone Encounter (Signed)
Left message on machine for pt rx called in

## 2012-12-23 NOTE — Assessment & Plan Note (Signed)
Antibiotics, nasal saline 

## 2012-12-23 NOTE — Assessment & Plan Note (Signed)
This is multifactorial also affecting sexual libido, she has had difficulty with her health for the past few months Will start effexor

## 2012-12-23 NOTE — Assessment & Plan Note (Signed)
Followed by endocrine on insulin pump now, CBG have already improved

## 2012-12-25 ENCOUNTER — Encounter: Payer: Self-pay | Admitting: Orthopedic Surgery

## 2012-12-25 ENCOUNTER — Ambulatory Visit (INDEPENDENT_AMBULATORY_CARE_PROVIDER_SITE_OTHER): Payer: BC Managed Care – PPO | Admitting: Orthopedic Surgery

## 2012-12-25 VITALS — BP 118/74 | Ht <= 58 in | Wt 236.0 lb

## 2012-12-25 DIAGNOSIS — M792 Neuralgia and neuritis, unspecified: Secondary | ICD-10-CM

## 2012-12-25 DIAGNOSIS — IMO0002 Reserved for concepts with insufficient information to code with codable children: Secondary | ICD-10-CM

## 2012-12-25 NOTE — Progress Notes (Signed)
Patient ID: [name], female   DOB: 1966-01-19, 47 y.o.   MRN: [id number] Chief Complaint  Patient presents with  . Follow-up    Recheck shoulder and neck pain.   [name] is a 47 y.o. female who presents with RIGHT shoulder pain radiating up into the neck. No trauma. The patient reports sharp throbbing, stabbing, 10 out of 10, constant intermittent pain, unrelieved by ibuprofen. Associated numbness and tingling, and painful forward elevation. Cardiac workup which proved negative for any cardiac disease. She's had physical therapy and did not improve.  She has radicular pain in her right upper extremity  She denies any weakness she does complain of some neck stiffness her symptoms are brought on when she has her neck in a flexed position.  Further review of systems she denies any chest pain. She denies any bowel or bladder problems including retention or incontinence  Past Medical History  Diagnosis Date  . Diabetes mellitus     BP 118/74  Ht 4\' 8"  (1.422 m)  Wt 236 lb (107.049 kg)  BMI 52.91 kg/m2  General appearance is normal she has obesity and recently diagnosed uncontrolled diabetes she is on a insulin type pump. She is oriented x3 her mood and affect are normal she is ambulatory without antalgic gait or unsteady gait. She has decreased range of motion in the cervical spine with tenderness in the left trapezius muscle tenderness at the base of the cervical spine a positive Spurling's test on the right upper extremities have normal strength grade 5 normal skin. Normal skin noted over the spine with no patches or lesions, is normal pulse and temperature in both upper extremities and no cervical or axillary lymphadenopathy. No sensory deficits but she has an abnormal right biceps reflex. No pathologic reflexes are elicited. Her balance is normal  She had an x-ray of her spine which was normal  Impression cervical disc disease cannot rule out herniation  Recommend MRI of the  cervical spine. 10 mg of Valium secondary to claustrophobia

## 2012-12-26 ENCOUNTER — Other Ambulatory Visit: Payer: Self-pay | Admitting: Family Medicine

## 2012-12-26 DIAGNOSIS — Z139 Encounter for screening, unspecified: Secondary | ICD-10-CM

## 2012-12-29 ENCOUNTER — Telehealth: Payer: Self-pay | Admitting: Radiology

## 2012-12-29 NOTE — Telephone Encounter (Signed)
Patient has MRI appointment at [facility] on 12-30-12 at 5:00. Patient has BCBS, no precert needed per [employer]. Patient will follow back up here in our office.

## 2012-12-30 ENCOUNTER — Ambulatory Visit (HOSPITAL_COMMUNITY)
Admission: RE | Admit: 2012-12-30 | Discharge: 2012-12-30 | Disposition: A | Discharge status: Home / Self Care | Payer: BC Managed Care – PPO | Source: Ambulatory Visit | Attending: Orthopedic Surgery | Admitting: Orthopedic Surgery

## 2012-12-30 DIAGNOSIS — M542 Cervicalgia: Secondary | ICD-10-CM | POA: Insufficient documentation

## 2012-12-30 DIAGNOSIS — M5030 Other cervical disc degeneration, unspecified cervical region: Secondary | ICD-10-CM | POA: Insufficient documentation

## 2012-12-30 DIAGNOSIS — M792 Neuralgia and neuritis, unspecified: Secondary | ICD-10-CM

## 2012-12-30 DIAGNOSIS — M25519 Pain in unspecified shoulder: Secondary | ICD-10-CM | POA: Insufficient documentation

## 2012-12-30 DIAGNOSIS — M5020 Other cervical disc displacement, unspecified cervical region: Secondary | ICD-10-CM | POA: Insufficient documentation

## 2012-12-31 ENCOUNTER — Other Ambulatory Visit (HOSPITAL_COMMUNITY): Payer: BC Managed Care – PPO

## 2013-01-06 ENCOUNTER — Telehealth: Payer: Self-pay | Admitting: Orthopedic Surgery

## 2013-01-06 ENCOUNTER — Telehealth: Payer: Self-pay | Admitting: Radiology

## 2013-01-06 NOTE — Telephone Encounter (Signed)
Patient is sick and would like you to call her with her MRI results. [phone].

## 2013-01-06 NOTE — Telephone Encounter (Signed)
TALKED TO HER   REFER TO NEUROSURGERY

## 2013-01-06 NOTE — Telephone Encounter (Signed)
MRI RESULT:   IMPRESSION:  1. Caudal disc herniation at C6-C7 superimposed on chronic disc  and endplate degeneration. Mild spinal stenosis with mild spinal  cord mass effect but no spinal cord signal abnormality. Mild if  any associated C7 foraminal stenosis.  2. Mild disc degeneration at C4-C5 and C5-C6 with small left  paracentral herniation at the latter.    LEFT MESSAGE

## 2013-01-08 ENCOUNTER — Ambulatory Visit (HOSPITAL_COMMUNITY)
Admission: RE | Admit: 2013-01-08 | Discharge: 2013-01-08 | Disposition: A | Discharge status: Home / Self Care | Payer: BC Managed Care – PPO | Source: Ambulatory Visit | Attending: Family Medicine | Admitting: Family Medicine

## 2013-01-08 ENCOUNTER — Telehealth: Payer: Self-pay | Admitting: *Deleted

## 2013-01-08 ENCOUNTER — Ambulatory Visit: Payer: BC Managed Care – PPO | Admitting: Orthopedic Surgery

## 2013-01-08 DIAGNOSIS — Z1231 Encounter for screening mammogram for malignant neoplasm of breast: Secondary | ICD-10-CM | POA: Insufficient documentation

## 2013-01-08 DIAGNOSIS — Z139 Encounter for screening, unspecified: Secondary | ICD-10-CM

## 2013-01-19 ENCOUNTER — Ambulatory Visit: Payer: BC Managed Care – PPO | Admitting: Family Medicine

## 2013-02-09 ENCOUNTER — Ambulatory Visit: Payer: Self-pay | Admitting: Internal Medicine

## 2013-02-12 ENCOUNTER — Encounter: Payer: Self-pay | Admitting: Internal Medicine

## 2013-02-12 ENCOUNTER — Ambulatory Visit (INDEPENDENT_AMBULATORY_CARE_PROVIDER_SITE_OTHER): Payer: BC Managed Care – PPO | Admitting: Internal Medicine

## 2013-02-12 VITALS — BP 108/62 | HR 88 | Temp 98.1°F | Resp 16 | Ht 62.5 in | Wt 239.0 lb

## 2013-02-12 DIAGNOSIS — IMO0001 Reserved for inherently not codable concepts without codable children: Secondary | ICD-10-CM

## 2013-02-12 DIAGNOSIS — IMO0002 Reserved for concepts with insufficient information to code with codable children: Secondary | ICD-10-CM

## 2013-02-12 DIAGNOSIS — E1165 Type 2 diabetes mellitus with hyperglycemia: Secondary | ICD-10-CM

## 2013-02-12 DIAGNOSIS — R6882 Decreased libido: Secondary | ICD-10-CM

## 2013-02-12 MED ORDER — LIDOCAINE HCL 4 % MT SOLN: OROMUCOSAL | Status: DC

## 2013-02-12 NOTE — Progress Notes (Addendum)
Subjective:     Patient ID: [name], female   DOB: 25-Oct-1966, 47 y.o.   MRN: [id number]  HPI Pt. returns for for follow up of DM2, insulin-dependent,uncontrolled, with complications (neuropathy), dx Spring 2013. Last visit was 1 month ago. She is on an insulin pump, which she started 2 months ago.   She has a Medtronic MiniMed 530G pump, not yet connected to the Enlite sensor. She tells me she did not have a meeting with Nancie Neas yet as she started work and the classes are early in am and she cannot attend. I referred her to [facility] before, but she initially was seeing Nancie Neas so she did not schedule an appt with DM education.   Last HbA1C - from before starting the pump: Lab Results  Component Value Date   HGBA1C 9.4* 11/18/2012   Per her report,her settings are unchanged from our last visit: - basal rates: 1.2 units/h >> 28.8 units/day - bolus wizard on - active insulin time: 4 h - target CBG: 150 - ICR: 1:7.5 - ISF: 20 Bolus history: mostly around 12 units.  She checks her sugars 6x a day - again does not bring log: - am: 190-200 >> 130-140 now - pre-lunch: 190-200s >> 190, depending on what she eats in am - higher if she eats carbs: toasted strudel, cereals and milk - pre-dinner: 190-200s >> 150-170, highest 220 Snack: yoghurt, chips  I reviewed the bolus history in her pump, and noticed that she only bolused once yesterday, around 7 PM; she did not bolus the day before; and again bolused only once, in a.m., the previous day. I did not review her pump further. The sugars associated with these boluses are in the low 200s. Upon questioning, she is apparently only bolusing when her sugars are higher than her CBG target of 150 (!).  She is complaining of pain with inserting the cannula and I printed a prescription for lidocaine solution for her. She could not pick up the previous prescription the patient sent to her pharmacy. She also complains of the pod coming off  after wearing it for about a day, and especially after showering.  Last foot exam was 2 mo ago with Dr. Jeanice Lim. No diabetic nephropathy. She has neuropathy, for which she was started on Gabapentin qhs by Dr. Jeanice Lim. Last eye exam was 2 mo ago (Dr. Jerolyn Center) >> reportedly no DR.  Reviewed PMH, Allergies, SH, FH, Soc H.and they are unchanged.   Review of Systems Constitutional: no weight gain/loss, + fatigue, hot flashes, nocturia x2, poor sleep Eyes: no blurry vision, no xerophthalmia ENT: no sore throat, no nodules palpated in throat, no dysphagia/odynophagia, no hoarseness Cardiovascular: no CP/SOB/palpitations/leg swelling Respiratory: + cough/SOB Gastrointestinal: no N/V/D/C Musculoskeletal: + muscle/no joint aches Skin: no rashes Neurological: no tremors/numbness/tingling/dizziness Psychiatric: no depression/anxiety Low libido     Objective:   Physical Exam BP 108/62  Pulse 88  Temp(Src) 98.1 F (36.7 C) (Oral)  Resp 16  Ht 5' 2.5" (1.588 m)  Wt 239 lb (108.41 kg)  BMI 42.99 kg/m2  SpO2 98% Wt Readings from Last 3 Encounters:  02/12/13 239 lb (108.41 kg)  12/25/12 236 lb (107.049 kg)  12/22/12 236 lb 1.9 oz (107.103 kg)   Constitutional: obesity class 3, in NAD Eyes: PERRLA, EOMI, no exophthalmos ENT: moist mucous membranes, no thyromegaly, no cervical lymphadenopathy Cardiovascular: RRR, No MRG Respiratory: CTA B Gastrointestinal: abdomen soft, NT, ND, BS+ Musculoskeletal: no deformities, strength intact in all 4 Skin: moist,  warm, + rash from previous pod applications of the abdomen Neurological: no tremor with outstretched hands, DTR normal in all 4     Assessment:     1. DM2 - insulin-dependent, uncontrolled - now on insulin pump: Medtronic, MiniMed 530G pump  2. Perimenopause - Hot flashes - Low libido    Plan:     1. DM2 - Patient is benefiting from her insulin pump, however, not maximally; and we discussed about the fact that she needs more training.  I advised her to call diabetes education and schedule an appointment with Pincus Large for further training. - For now, I advised her to keep the same insulin settings but bolus with all main meals based on her insulin to carb ratio. I explained the purpose of the bolus wizard and the way it calculates the amount of insulin needed per meal - she also needs to write her sugars down and bring them at the next appointment, in a month - I told her to call the company and tell them about the pod adhesive band problem - I will check a hemoglobin A1c - I gave her the lab order so she can have the blood drawn at [facility]  2. On her way out, the patient asked me whether her low libido can be related to her high sugars, and I doubt it since her blood sugars became fairly well-controlled recently. Her busy schedule and conjugate, but most likely, based on the presence of hot flashes, she is perimenopausal, and this can also contribute. - we briefly discussed about the fact that if her hot flashes and low libido is bothering her a lot, and if she is postmenopausal, she might try hormone replacement therapy, which would be started by OB/GYN - However, if she is not postmenopausal, I would not recommend hormonal treatment - She asked me whether there is a test to check to see if she is menopausal, and we decided to check an estradiol, LH, and FSH levels  Office Visit on 02/12/2013  Component Date Value Range Status  . Hemoglobin A1C 02/20/2013 8.2* <5.7 % Final   Comment:                                                                                                 According to the ADA Clinical Practice Recommendations for 2011, when                          HbA1c is used as a screening test:                                                       >=6.5%   Diagnostic of Diabetes Mellitus                                     (  if abnormal result is confirmed)                                                      5.7-6.4%   Increased risk of developing Diabetes Mellitus                                                     References:Diagnosis and Classification of Diabetes Mellitus,Diabetes                          Care,2011,34(Suppl 1):S62-S69 and Standards of Medical Care in                                  Diabetes - 2011,Diabetes Care,2011,34 (Suppl 1):S11-S61.                             . Mean Plasma Glucose 02/20/2013 189* <117 mg/dL Final  . LH 95/63/8756 7.3   Final   Comment: Reference Ranges:                                   Female:     20 - 70 Years           1.5 -  9.3 mIU/mL                                                > 70 Years           3.1 - 34.6 mIU/mL                                   Female:   Follicular Phase        1.9 - 12.5 mIU/mL                                             Midcycle                8.7 - 76.3 mIU/mL                                             Luteal Phase            0.5 - 16.9 mIU/mL                                             Post Menopausal        15.9 - 54.0  mIU/mL                                             Pregnant                    <  1.5 mIU/mL                                             Contraceptives          0.7 -  5.6 mIU/mL                                   Children:                             <  6.0 mIU/mL                             . [facility] 02/20/2013 26.4   Final   Comment: Reference Ranges:                                   Female:                         1.4 -  18.1 mIU/mL                                   Female:   Follicular Phase    2.5 -  10.2 mIU/mL                                             MidCycle Peak       3.4 -  33.4 mIU/mL                                             Luteal Phase        1.5 -   9.1 mIU/mL                                             Post Menopausal    23.0 - 116.3 mIU/mL                                             Pregnant                <   0.3 mIU/mL  . Estradiol, Free 02/12/2013 0.18   Final   Comment:  FEMALE REFERENCE RANGES FOR ESTRADIOL, FREE:  Follicular Stage:  0.43-5.03 pg/mL                            Mid-cycle Stage:   0.72-5.89 pg/mL                            Luteal Stage:      0.40-5.55 pg/mL                            Postmenopausal:    < or = 0.38 pg/mL  . Estradiol 02/12/2013 7   Final   Comment: FEMALE REFERENCE RANGES FOR ESTRADIOL:                            Follicular Stage:  39-375 pg/mL                            Mid-cycle Stage:   94-762 pg/mL                            Luteal Stage:      48-440 pg/mL                            Postmenopausal:    < or = 10 pg/mL  . Results Received 02/12/2013 03/01/13   Final   Comment: Reference lab accession: [id number]                          Test performed by:                                     [facility]                                     [address] [place], [place] [address]                                     Phone:  [phone]                          Director:  Pat Patrick, M.D.   HbA1C improved despite pt not getting full benefit of the pump.  FSH high, likely pt premenopausal, but will await estradiol level. The labs are confounded by the fact that she is taking Megace. She tells me that ever since her endometrial ablation last year, her menstrual cycles are irregular, and she can bleed 2 days at that time sporadically. She believes that her LMP was approximately 2-3 weeks ago, so it is fairly unlikely that a high FSH could represent an ovulatory surge, especially in the context of a lower LH. (The low estradiol level confirms this assumption).  Patient  was called with the results, and she asked me whether there is something that she can take for her low libido. There is nothing that I would recommend for now, however, I will defer this for her OB/GYN doctor, Dr. Christin Bach, to which I will forward my note.

## 2013-02-12 NOTE — Patient Instructions (Addendum)
Please bolus with every meal (main). Please return in 1 month. Please call and schedule an appointment with [facility].

## 2013-02-17 ENCOUNTER — Other Ambulatory Visit: Payer: Self-pay | Admitting: Internal Medicine

## 2013-02-17 DIAGNOSIS — IMO0002 Reserved for concepts with insufficient information to code with codable children: Secondary | ICD-10-CM

## 2013-02-18 ENCOUNTER — Encounter: Payer: Self-pay | Admitting: Family Medicine

## 2013-02-21 LAB — HEMOGLOBIN A1C
Hgb A1c MFr Bld: 8.2 % — ABNORMAL HIGH (ref <5.7)
Mean Plasma Glucose: 189 mg/dL — ABNORMAL HIGH (ref <117)

## 2013-02-21 LAB — FOLLICLE STIMULATING HORMONE: FSH: 26.4 m[IU]/mL

## 2013-02-21 LAB — LUTEINIZING HORMONE: LH: 7.3 m[IU]/mL

## 2013-02-23 ENCOUNTER — Ambulatory Visit: Payer: Self-pay | Admitting: Family Medicine

## 2013-02-25 ENCOUNTER — Ambulatory Visit: Payer: BC Managed Care – PPO | Admitting: *Deleted

## 2013-02-26 ENCOUNTER — Telehealth: Payer: Self-pay | Admitting: *Deleted

## 2013-02-26 NOTE — Telephone Encounter (Signed)
Can they send [place] the result? They did not released it to Epic.Marland KitchenMarland Kitchen

## 2013-02-26 NOTE — Telephone Encounter (Signed)
Called [employer] and they advised me that the pt's estradial was done on March 6th.

## 2013-02-26 NOTE — Telephone Encounter (Signed)
OK, we can wait a little longer, then. Thank you.

## 2013-02-26 NOTE — Telephone Encounter (Signed)
Called [employer] and requested Estradiol results. Customer Service Rep stated that results are showing as pending and that is the reason we have not received final results yet. Please advise.

## 2013-03-01 LAB — ESTRADIOL, FREE
Estradiol, Free: 0.18 pg/mL
Estradiol: 7 pg/mL

## 2013-03-02 ENCOUNTER — Encounter: Payer: Self-pay | Admitting: Internal Medicine

## 2013-03-03 ENCOUNTER — Ambulatory Visit: Payer: BC Managed Care – PPO | Admitting: Family Medicine

## 2013-03-23 ENCOUNTER — Ambulatory Visit: Payer: BC Managed Care – PPO | Admitting: Internal Medicine

## 2013-04-04 ENCOUNTER — Other Ambulatory Visit: Payer: Self-pay | Admitting: Family Medicine

## 2013-05-13 ENCOUNTER — Ambulatory Visit: Payer: Self-pay | Admitting: Family Medicine

## 2013-05-18 ENCOUNTER — Ambulatory Visit (INDEPENDENT_AMBULATORY_CARE_PROVIDER_SITE_OTHER): Payer: No Typology Code available for payment source | Admitting: Internal Medicine

## 2013-05-18 ENCOUNTER — Encounter: Payer: Self-pay | Admitting: Internal Medicine

## 2013-05-18 ENCOUNTER — Telehealth: Payer: Self-pay | Admitting: *Deleted

## 2013-05-18 VITALS — BP 122/80 | HR 81 | Temp 98.6°F | Resp 10 | Wt 233.0 lb

## 2013-05-18 DIAGNOSIS — IMO0001 Reserved for inherently not codable concepts without codable children: Secondary | ICD-10-CM

## 2013-05-18 DIAGNOSIS — IMO0002 Reserved for concepts with insufficient information to code with codable children: Secondary | ICD-10-CM

## 2013-05-18 DIAGNOSIS — G609 Hereditary and idiopathic neuropathy, unspecified: Secondary | ICD-10-CM

## 2013-05-18 DIAGNOSIS — E1165 Type 2 diabetes mellitus with hyperglycemia: Secondary | ICD-10-CM

## 2013-05-18 MED ORDER — PREGABALIN 50 MG PO CAPS: ORAL_CAPSULE | ORAL | Status: DC

## 2013-05-18 MED ORDER — L-METHYLFOLATE-B6-B12 3-35-2 MG PO TABS: 1.0000 | ORAL_TABLET | Freq: Two times a day (BID) | ORAL | Status: DC

## 2013-05-18 NOTE — Telephone Encounter (Signed)
Pt called wanting to know when her rx for the metanx will be sent in to [facility] in [place]. Please advise. Thank you.

## 2013-05-18 NOTE — Telephone Encounter (Signed)
I sent it to [facility], but I just entered it for [facility] now.

## 2013-05-18 NOTE — Progress Notes (Signed)
Subjective:     Patient ID: [name], female   DOB: 02-20-1966, 47 y.o.   MRN: [id number]  HPI Pt. returns for for follow up of DM2, insulin-dependent,uncontrolled, with complications (neuropathy), dx Spring 2013. She is on an insulin pump, which she started 5 months ago. Last visit 3 mo ago.  She has a Medtronic MiniMed 530G pump, not yet connected to the Enlite sensor. She did not have a meeting with Nancie Neas yet as she started work and the classes are early in am and she cannot attend. I referred her to [facility] before, and she will have the CGM in place on 06/18.   Last HbA1C: Lab Results  Component Value Date   HGBA1C 8.2* 02/20/2013  Decreased from 9.4% before starting the pump.  Her settings are unchanged from our last visit: - basal rates: 1.2 units/h >> 28.8 units/day - bolus wizard on - active insulin time: 4 h - target CBG: 150 - ICR: 1:7.5 - ISF: 20 Bolus history: mostly around 8-10 units. I reviewed the pump bolus history, and she is still not bolusing with every meal.  The sugars associated with these boluses are in the low 200s. At last visit, I realize that she was only bolusing when her sugars are higher than her CBG target of 150 (!), the strong advice of the bolus at every meal. She still has 2-3 days in a row in which she does not bolus at all.   She checks her sugars 6x a day - again does not bring log: - am: 190-200 >> 130-140 >> 200s - pre-lunch: 190-200s >> 190 >> 180-200s - pre-dinner: 190-200s >> 150-170, highest 220 >> 200s Snack: yoghurt, chips  Last foot exam was 5 mo ago with Dr. Jeanice Lim. No diabetic nephropathy. She has neuropathy, for which she was started on Gabapentin qhs by Dr. Jeanice Lim. Last eye exam was 5 mo ago (Dr. Jerolyn Center) >> reportedly no DR.  I reviewed pt's medications, allergies, PMH, social hx, family hx and no changes required, except as mentioned above.  Review of Systems Constitutional: no weight gain/loss, + fatigue, hot  flashes, nocturia x2, poor sleep Eyes: no blurry vision, no xerophthalmia ENT: no sore throat, no nodules palpated in throat, no dysphagia/odynophagia, + hoarseness Cardiovascular: + CP (cough)/SOB/palpitations/leg swelling Respiratory: + cough/+ SOB Gastrointestinal: no N/V/D/C Musculoskeletal: + muscle/+ joint aches Skin: no rashes Neurological: no tremors/numbness/tingling/dizziness Psychiatric: no depression/anxiety Low libido     Objective:   Physical Exam BP 122/80  Pulse 81  Temp(Src) 98.6 F (37 C) (Oral)  Resp 10  Wt 233 lb (105.688 kg)  BMI 41.91 kg/m2  SpO2 97% Wt Readings from Last 3 Encounters:  05/18/13 233 lb (105.688 kg)  02/12/13 239 lb (108.41 kg)  12/25/12 236 lb (107.049 kg)  Constitutional: obesity class 3, in NAD Eyes: PERRLA, EOMI, no exophthalmos ENT: moist mucous membranes, no thyromegaly, no cervical lymphadenopathy Cardiovascular: RRR, No MRG Respiratory: CTA B Gastrointestinal: abdomen soft, NT, ND, BS+ Musculoskeletal: no deformities, strength intact in all 4 Skin: moist, warm, +  rash from previous pod applications of the abdomen Neurological: no tremor with outstretched hands, DTR normal in all 4  Assessment:     1. DM2 - insulin-dependent, uncontrolled - now on insulin pump: Medtronic, MiniMed 530G pump  2. Peripheral neuropathy - on neurontin, not working - right leg     Plan:     1. DM2 - Patient is benefiting from her insulin pump, however, not maximally; and we  repeatedly discussed about the fact that she needs more training. She will have an appointment with [facility] Large for further training and attachment of the Enlite sensor next week.  - She again does not bring a sugar log - therefore, it is difficult to adjust her pump settings. Hopefully she can upload the sugars in [facility] in the future.  - Looking in her pump, she boluses at maximum 2x a day, and has gaps of 2-3 days without any bolus. This is likely the reason for her  higher sugars recently - from her pump, mostly in the 160-200s. Also, it appears that she does not get more than ~10 units per bolus although I calculate 1-2 units more based on ICR and ISF.Marland Kitchen. - For now, I advised her to keep the same insulin settings but bolus with all main meals based on her insulin to carb ratio as advised by the bolus wizard - I will check a hemoglobin A1c - I gave her the lab order so she can have the blood drawn at [facility]  2. Peripheral neuropathy - mostly in right leg, sensation of torsion and burning in the whole leg - ? If diabetic in origin - no relief from neurontin - discussed to start Lyrica, will stop neurontin while on Lyrica - advised on SE of sedation, dizziness with Lyrica - If not helping, will consider referral to neurology for NCT - not sure if covered by insurance, if not we can try: Can try the following combination for neuropathy - written this down for her: - alpha-lipoic acid 600 mg twice a day - Metanx (can get it cheaper from Brand Direct online) 1 tablet twice a day If Metanx is too expensive, can get B complex 1 tablet once a day

## 2013-05-18 NOTE — Telephone Encounter (Signed)
Called pt and let her know her rx has been sent in.

## 2013-05-18 NOTE — Patient Instructions (Addendum)
Please return in 3 months with your sugar log.  Please bolus with every meal! Start Lyrica 50 mg 3x a day. If pain not better in 1 week, can double the dose (100 mg 3x a day). Stop neurontin while on Lyrica. Please be aware that Lyrica can make you feel more tired, somnolent, dizzy.

## 2013-05-19 ENCOUNTER — Telehealth: Payer: Self-pay | Admitting: *Deleted

## 2013-05-19 ENCOUNTER — Ambulatory Visit: Payer: BC Managed Care – PPO | Admitting: Family Medicine

## 2013-05-19 NOTE — Telephone Encounter (Signed)
Pt called and lvm stating that when she went to pick up the rx for metanx that her co-pay is going to be $90 a month. She states she cannot afford this medication either. Pt would like to know if there is anything else that she can take that is more affordable or samples of the medication. Please advise.

## 2013-05-19 NOTE — Telephone Encounter (Signed)
As I advised her, she can take B-complex over the counter, once a day.

## 2013-05-19 NOTE — Telephone Encounter (Signed)
Called pt and advised her per Dr Elvera Lennox that she can take the vit B-complex over the counter, once a day. Pt understood.

## 2013-05-25 NOTE — Telephone Encounter (Signed)
Error

## 2013-05-27 ENCOUNTER — Encounter: Payer: No Typology Code available for payment source | Attending: Internal Medicine | Admitting: *Deleted

## 2013-05-27 ENCOUNTER — Other Ambulatory Visit: Payer: Self-pay | Admitting: Internal Medicine

## 2013-05-27 ENCOUNTER — Encounter: Payer: Self-pay | Admitting: *Deleted

## 2013-05-27 LAB — HEMOGLOBIN A1C
Hgb A1c MFr Bld: 7.8 % — ABNORMAL HIGH (ref <5.7)
Mean Plasma Glucose: 177 mg/dL — ABNORMAL HIGH (ref <117)

## 2013-05-27 NOTE — Progress Notes (Signed)
CGM Training:  Appt start time: 1500 end time:1700.  Assessment:  Primary concerns today: Patient here for initiation of Medtronic Continuous Glucose Monitoring with Enlite sensor  Medications: see list     Intervention:   Understanding Glucose Sensing Programming Sensor Information  High Glucose: 300 mg/dl  Low Glucose OFF mg/dl  Other settings to be added at follow up visit Starting [employer] of Product  Entering BG, Calibration Technique and Graphs with Public librarian and Alarms   Follow Up Patient to see me for insulin dose adjustments and to schedule visit with me for CGM follow up within 4 week(s).

## 2013-05-28 ENCOUNTER — Telehealth: Payer: Self-pay | Admitting: *Deleted

## 2013-05-28 NOTE — Telephone Encounter (Signed)
Called pt and let her know that her HgbA1C has improved, it is now 7.8. Advised her to continue on her same medication regiment and call us if she has any problems. Pt understood.

## 2013-06-30 ENCOUNTER — Ambulatory Visit: Payer: BC Managed Care – PPO | Admitting: Family Medicine

## 2013-07-08 ENCOUNTER — Ambulatory Visit: Payer: BC Managed Care – PPO | Admitting: Family Medicine

## 2013-07-15 ENCOUNTER — Encounter: Payer: Self-pay | Admitting: Family Medicine

## 2013-07-15 ENCOUNTER — Ambulatory Visit (INDEPENDENT_AMBULATORY_CARE_PROVIDER_SITE_OTHER): Payer: BC Managed Care – PPO | Admitting: Family Medicine

## 2013-07-15 VITALS — BP 132/78 | HR 80 | Temp 98.2°F | Resp 18 | Wt 233.0 lb

## 2013-07-15 DIAGNOSIS — IMO0001 Reserved for inherently not codable concepts without codable children: Secondary | ICD-10-CM

## 2013-07-15 DIAGNOSIS — G589 Mononeuropathy, unspecified: Secondary | ICD-10-CM

## 2013-07-15 DIAGNOSIS — E785 Hyperlipidemia, unspecified: Secondary | ICD-10-CM

## 2013-07-15 DIAGNOSIS — E669 Obesity, unspecified: Secondary | ICD-10-CM

## 2013-07-15 DIAGNOSIS — E1165 Type 2 diabetes mellitus with hyperglycemia: Secondary | ICD-10-CM

## 2013-07-15 DIAGNOSIS — F32A Depression, unspecified: Secondary | ICD-10-CM

## 2013-07-15 DIAGNOSIS — IMO0002 Reserved for concepts with insufficient information to code with codable children: Secondary | ICD-10-CM

## 2013-07-15 DIAGNOSIS — F329 Major depressive disorder, single episode, unspecified: Secondary | ICD-10-CM

## 2013-07-15 DIAGNOSIS — G629 Polyneuropathy, unspecified: Secondary | ICD-10-CM

## 2013-07-15 DIAGNOSIS — F3289 Other specified depressive episodes: Secondary | ICD-10-CM

## 2013-07-15 DIAGNOSIS — R6882 Decreased libido: Secondary | ICD-10-CM

## 2013-07-15 DIAGNOSIS — R102 Pelvic and perineal pain: Secondary | ICD-10-CM | POA: Insufficient documentation

## 2013-07-15 MED ORDER — NORTRIPTYLINE HCL 25 MG PO CAPS: 25.0000 mg | ORAL_CAPSULE | Freq: Every day | ORAL | Status: DC

## 2013-07-15 MED ORDER — DULOXETINE HCL 60 MG PO CPEP: 60.0000 mg | ORAL_CAPSULE | Freq: Every day | ORAL | Status: DC

## 2013-07-15 NOTE — Assessment & Plan Note (Signed)
I referred her back to GYN. She's had ablation done in the past. She has pain with intercourse as well as difficulties with libido. I'll have them evaluate her and decide on best course of action. No she's not had a period this year

## 2013-07-15 NOTE — Assessment & Plan Note (Signed)
I think she currently has underlying depression. She's had difficulty with her health over the past couple of years. She's had difficulty with her relationship she is newly married. She's also having difficulty with her teenager and her previous ex-husband who is come back into their lives. She was on Effexor before but stopped at the prescription ran out and she lost her insurance. We will try her on Cymbalta as we're using this for neuropathy as well. She will followup in 2 months

## 2013-07-15 NOTE — Progress Notes (Signed)
  Subjective:    Patient ID: [name], female    DOB: 02/05/1966, 47 y.o.   MRN: [id number]  HPI Patient here to follow chronic medical problems. She's not been seen since January of this year. She is now following with endocrinology for her diabetes and is on an insulin pump. Her A1c's have improved. She continues to have difficulty with her diabetic neuropathy. She was tried on gabapentin but this did not help a prescription was written for Lyrica but she was unable to afford this. She denies any polyuria but states that she often go many hours without urinating she denies any dysuria or difficulty urinating.  She continues to have pelvic pain and pain with intercourse. She also has decreased libido. She was asking for medication for this. She's under a lot of stress with her teenage daughter and her current relationship. She feels stressed out all the time. He is currently working in a skilled nursing facility.   Review of Systems - per above  GEN- + fatigue, fever, weight loss,weakness, recent illness HEENT- denies eye drainage, change in vision, nasal discharge, CVS- denies chest pain, palpitations RESP- denies SOB, cough, wheeze ABD- denies N/V, change in stools, abd pain GU- denies dysuria, hematuria, dribbling, incontinence MSK- denies joint pain, muscle aches, injury Neuro- denies headache, dizziness, syncope, seizure activity      Objective:   Physical Exam GEN- NAD, alert and oriented x3 HEENT- PERRL, EOMI, non injected sclera, pink conjunctiva, MMM, oropharynx clear  Neck- Supple, no LAD CVS- RRR, no murmur RESP-CTAB ABD-NABS,soft,NT,ND EXT- No edema Pulses- Radial 2+ Psych- normal affect and mood  Skin- small scab over Left Great toe, no erythema       Assessment & Plan:

## 2013-07-15 NOTE — Assessment & Plan Note (Signed)
Recheck fasting lipid panel 

## 2013-07-15 NOTE — Assessment & Plan Note (Signed)
Her changes in libido continue. I referred her to GYN per above

## 2013-07-15 NOTE — Assessment & Plan Note (Signed)
She's been followed by endocrinology she's currently on insulin pump will defer to them

## 2013-07-15 NOTE — Patient Instructions (Signed)
Call GYN Start cymbalta once a day, if unable to afford start the nortriptiline as directed Get the labs done fasting this week F/U 2 months for recheck on meds

## 2013-07-15 NOTE — Assessment & Plan Note (Signed)
She is in need of weight loss. She has not lost any weight over the past 6 months.

## 2013-07-15 NOTE — Assessment & Plan Note (Signed)
Diabetic neuropathy has worsened. She is unable to afford Lyrica. Gabapentin did not help. I will try her on Cymbalta and she also has some stress and other mood disorder at this time. If she is unable to afford this and she will try nortriptyline. She was given a prescription for both in case her insurance did not cover the Cymbalta

## 2013-07-20 LAB — CBC WITH DIFFERENTIAL/PLATELET
Eosinophils Relative: 2 % (ref 0–5)
HCT: 40.4 % (ref 36.0–46.0)
Hemoglobin: 13.8 g/dL (ref 12.0–15.0)
Lymphocytes Relative: 25 % (ref 12–46)
Lymphs Abs: 1.9 10*3/uL (ref 0.7–4.0)
MCV: 82.4 fL (ref 78.0–100.0)
Platelets: 319 10*3/uL (ref 150–400)
RBC: 4.9 MIL/uL (ref 3.87–5.11)
WBC: 7.6 10*3/uL (ref 4.0–10.5)

## 2013-07-20 LAB — COMPREHENSIVE METABOLIC PANEL
Chloride: 98 mEq/L (ref 96–112)
Creat: 0.67 mg/dL (ref 0.50–1.10)
Potassium: 4.3 mEq/L (ref 3.5–5.3)
Sodium: 136 mEq/L (ref 135–145)
Total Bilirubin: 0.6 mg/dL (ref 0.3–1.2)
Total Protein: 6.6 g/dL (ref 6.0–8.3)

## 2013-07-20 LAB — LIPID PANEL
HDL: 47 mg/dL (ref >39)
Total CHOL/HDL Ratio: 4 Ratio

## 2013-07-21 MED ORDER — SIMVASTATIN 10 MG PO TABS: 10.0000 mg | ORAL_TABLET | Freq: Every day | ORAL | Status: DC

## 2013-07-21 NOTE — Addendum Note (Signed)
Addended by: Milinda Antis F on: 07/21/2013 02:18 PM   Modules accepted: Orders

## 2013-08-17 ENCOUNTER — Ambulatory Visit: Payer: No Typology Code available for payment source | Admitting: Internal Medicine

## 2013-08-17 DIAGNOSIS — Z0289 Encounter for other administrative examinations: Secondary | ICD-10-CM

## 2013-09-14 ENCOUNTER — Ambulatory Visit: Payer: BC Managed Care – PPO | Admitting: Family Medicine

## 2013-09-17 ENCOUNTER — Other Ambulatory Visit: Payer: Self-pay | Admitting: *Deleted

## 2013-09-17 DIAGNOSIS — IMO0002 Reserved for concepts with insufficient information to code with codable children: Secondary | ICD-10-CM

## 2013-09-17 DIAGNOSIS — E1165 Type 2 diabetes mellitus with hyperglycemia: Secondary | ICD-10-CM

## 2013-09-17 MED ORDER — INSULIN ASPART 100 UNIT/ML ~~LOC~~ SOLN: SUBCUTANEOUS | Status: DC

## 2013-09-30 ENCOUNTER — Emergency Department (HOSPITAL_COMMUNITY)
Admission: EM | Admit: 2013-09-30 | Discharge: 2013-09-30 | Disposition: A | Discharge status: Home / Self Care | Payer: BC Managed Care – PPO | Attending: Emergency Medicine | Admitting: Emergency Medicine

## 2013-09-30 ENCOUNTER — Encounter (HOSPITAL_COMMUNITY): Payer: Self-pay | Admitting: Emergency Medicine

## 2013-09-30 DIAGNOSIS — Z880 Allergy status to penicillin: Secondary | ICD-10-CM | POA: Insufficient documentation

## 2013-09-30 DIAGNOSIS — Z794 Long term (current) use of insulin: Secondary | ICD-10-CM | POA: Insufficient documentation

## 2013-09-30 DIAGNOSIS — H9209 Otalgia, unspecified ear: Secondary | ICD-10-CM | POA: Insufficient documentation

## 2013-09-30 DIAGNOSIS — Z7982 Long term (current) use of aspirin: Secondary | ICD-10-CM | POA: Insufficient documentation

## 2013-09-30 DIAGNOSIS — E119 Type 2 diabetes mellitus without complications: Secondary | ICD-10-CM | POA: Insufficient documentation

## 2013-09-30 DIAGNOSIS — H9202 Otalgia, left ear: Secondary | ICD-10-CM

## 2013-09-30 DIAGNOSIS — Z79899 Other long term (current) drug therapy: Secondary | ICD-10-CM | POA: Insufficient documentation

## 2013-09-30 MED ORDER — NEOMYCIN-POLYMYXIN-HC 1 % OT SOLN
3.0000 [drp] | Freq: Once | OTIC | Status: DC
  Filled 2013-09-30: qty 10

## 2013-09-30 MED ORDER — CLINDAMYCIN HCL 150 MG PO CAPS
300.0000 mg | ORAL_CAPSULE | Freq: Once | ORAL | Status: AC
  Administered 2013-09-30: 300 mg via ORAL
  Filled 2013-09-30: qty 2

## 2013-09-30 MED ORDER — HYDROCODONE-ACETAMINOPHEN 5-325 MG PO TABS: 1.0000 | ORAL_TABLET | ORAL | Status: DC | PRN

## 2013-09-30 MED ORDER — CLINDAMYCIN HCL 150 MG PO CAPS: ORAL_CAPSULE | ORAL | Status: DC

## 2013-09-30 MED ORDER — ONDANSETRON HCL 4 MG PO TABS
4.0000 mg | ORAL_TABLET | Freq: Once | ORAL | Status: AC
  Administered 2013-09-30: 4 mg via ORAL
  Filled 2013-09-30: qty 1

## 2013-09-30 MED ORDER — IBUPROFEN 800 MG PO TABS
800.0000 mg | ORAL_TABLET | Freq: Once | ORAL | Status: AC
  Administered 2013-09-30: 800 mg via ORAL
  Filled 2013-09-30: qty 1

## 2013-09-30 NOTE — ED Provider Notes (Signed)
CSN: [id number]     Arrival date & time 09/30/13  4098 History   First MD Initiated Contact with Patient 09/30/13 [phone]     Chief Complaint  Patient presents with  . Otalgia   (Consider location/radiation/quality/duration/timing/severity/associated sxs/prior Treatment) Patient is a 47 y.o. female presenting with ear pain. The history is provided by the patient.  Otalgia Location:  Left Quality:  Aching Severity:  Moderate Onset quality:  Gradual Duration:  3 days Timing:  Intermittent Progression:  Worsening Chronicity:  New Context: not direct blow and not foreign body in ear   Relieved by:  Nothing Worsened by:  Nothing tried Associated symptoms: no abdominal pain, no cough, no fever, no neck pain, no rash and no sore throat     Past Medical History  Diagnosis Date  . Diabetes mellitus    Past Surgical History  Procedure Laterality Date  . Cesarean section  1994;2000    [place]  . Cholecystectomy  1992  . Sinus sergery  1988    [place]  . Foot surgery  March, 2013    [facility]  . Hysteroscopy with thermachoice  04/25/2012    Procedure: HYSTEROSCOPY WITH THERMACHOICE;  Surgeon: Lazaro Arms, MD;  Location: [facility];  Service: Gynecology;  Laterality: N/A;  total therapy time= 11 minutes 42 seconds; 34 ml D5w  in and 34 ml D5w out; temp =87 degrees F  . Cystoscopy w/ ureteral stent placement  05/08/2012    Procedure: CYSTOSCOPY WITH RETROGRADE PYELOGRAM/URETERAL STENT PLACEMENT;  Surgeon: Ky Barban, MD;  Location: [facility];  Service: Urology;  Laterality: Right;   Family History  Problem Relation Age of Onset  . Heart disease    . Cancer    . Diabetes    . Achalasia    . Anesthesia problems Neg Hx   . Hypotension Neg Hx   . Malignant hyperthermia Neg Hx   . Pseudochol deficiency Neg Hx   . Diabetes Mother   . Depression Paternal Grandmother   . Depression Paternal Grandfather    History  Substance Use Topics  . Smoking status: Never Smoker   .  Smokeless tobacco: Never Used  . Alcohol Use: No   OB History   Grav Para Term Preterm Abortions TAB SAB Ect Mult Living                 Review of Systems  Constitutional: Negative for fever and activity change.       All ROS Neg except as noted in HPI  HENT: Positive for ear pain. Negative for nosebleeds and sore throat.   Eyes: Negative for photophobia and discharge.  Respiratory: Negative for cough, shortness of breath and wheezing.   Cardiovascular: Negative for chest pain and palpitations.  Gastrointestinal: Negative for abdominal pain and blood in stool.  Genitourinary: Negative for dysuria, frequency and hematuria.  Musculoskeletal: Negative for arthralgias, back pain and neck pain.  Skin: Negative.  Negative for rash.  Neurological: Negative for dizziness, seizures and speech difficulty.  Psychiatric/Behavioral: Negative for hallucinations and confusion.    Allergies  Ciprofloxacin; Penicillins; Codeine; Morphine; and Sulfonamide derivatives  Home Medications   Current Outpatient Rx  Name  Route  Sig  Dispense  Refill  . ascorbic acid (VITAMIN C) 250 MG CHEW   Oral   Chew 250 mg by mouth daily.          Marland Kitchen aspirin EC 81 MG EC tablet   Oral   Take 1 tablet (81 mg total)  by mouth daily.   30 tablet   1   . DULoxetine (CYMBALTA) 60 MG capsule   Oral   Take 1 capsule (60 mg total) by mouth daily.   30 capsule   3   . glucagon (GLUCAGON EMERGENCY) 1 MG injection      Inject in the muscle as needed for hypoglycemia   1 each   2   . insulin aspart (NOVOLOG) 100 UNIT/ML injection      Use in the insulin pump, approx. 100 units a day   40 mL   3   . Lidocaine HCl 4 % SOLN      Use as needed with pump insertions   Patient needs a prescription hand written.   1 vial   3   . lisinopril (ZESTRIL) 2.5 MG tablet   Oral   Take 1 tablet (2.5 mg total) by mouth daily. For kidney protection   30 tablet   6   . metFORMIN (GLUCOPHAGE) 500 MG tablet   Oral    Take 1,000 mg by mouth 2 (two) times daily with a meal.          . nitroGLYCERIN (NITROSTAT) 0.4 MG SL tablet   Sublingual   Place 1 tablet (0.4 mg total) under the tongue every 5 (five) minutes as needed for chest pain. Go to ER if 2nd dose needed   20 tablet   1   . nortriptyline (PAMELOR) 25 MG capsule   Oral   Take 1 capsule (25 mg total) by mouth at bedtime.   30 capsule   2   . nystatin (MYCOSTATIN/NYSTOP) 100000 UNIT/GM POWD   Topical   Apply 1 g topically 3 (three) times daily as needed. For rash         . simvastatin (ZOCOR) 10 MG tablet   Oral   Take 1 tablet (10 mg total) by mouth at bedtime. For cholesterol   30 tablet   3    There were no vitals taken for this visit. Physical Exam  Nursing note and vitals reviewed. Constitutional: She is oriented to person, place, and time. She appears well-developed and well-nourished.  Non-toxic appearance.  HENT:  Head: Normocephalic.  Right Ear: Tympanic membrane and external ear normal.  Left Ear: Tympanic membrane and external ear normal.  Mild to mod nasal congestion.  Eyes: EOM and lids are normal. Pupils are equal, round, and reactive to light.  Soreness with movement of the external left ear. There is a red area at the 3:00 position of the EAC c/w injury. No drainage. No bulging of the TM.  Neck: Normal range of motion. Neck supple. Carotid bruit is not present.  Cardiovascular: Normal rate, regular rhythm, normal heart sounds, intact distal pulses and normal pulses.   Pulmonary/Chest: Breath sounds normal. No respiratory distress.  Abdominal: Soft. Bowel sounds are normal. There is no tenderness. There is no guarding.  Musculoskeletal: Normal range of motion.  Lymphadenopathy:       Head (right side): No submandibular adenopathy present.       Head (left side): No submandibular adenopathy present.    She has no cervical adenopathy.  Neurological: She is alert and oriented to person, place, and time. She has  normal strength. No cranial nerve deficit or sensory deficit.  Skin: Skin is warm and dry.  Psychiatric: She has a normal mood and affect. Her speech is normal.    ED Course  Procedures (including critical care time) Labs Review Labs Reviewed -  No data to display Imaging Review No results found.  EKG Interpretation   None       MDM  No diagnosis found. *I have reviewed nursing notes, vital signs, and all appropriate lab and imaging results for this patient.*  Pt is a diabetic with a red area in the EAC on the left. No drainage or high fever. Some nasal congestion and c/o scratchy throat. Plan-cortisporin otic to the left ear. Clindamycin and norco Rx given to the patient. Pt to follow up with primary MD for recheck.  Kathie Dike, PA-C 09/30/13 [phone]

## 2013-09-30 NOTE — ED Provider Notes (Signed)
Medical screening examination/treatment/procedure(s) were performed by non-physician practitioner and as supervising physician I was immediately available for consultation/collaboration.  EKG Interpretation   None         Audree Camel, MD 09/30/13 [phone]

## 2013-09-30 NOTE — ED Notes (Signed)
C/o left ear pain x 3 days 

## 2013-10-19 ENCOUNTER — Ambulatory Visit (INDEPENDENT_AMBULATORY_CARE_PROVIDER_SITE_OTHER): Payer: BC Managed Care – PPO | Admitting: Internal Medicine

## 2013-10-19 ENCOUNTER — Telehealth: Payer: Self-pay | Admitting: Internal Medicine

## 2013-10-19 ENCOUNTER — Ambulatory Visit: Payer: BC Managed Care – PPO | Admitting: Internal Medicine

## 2013-10-19 ENCOUNTER — Encounter: Payer: Self-pay | Admitting: Internal Medicine

## 2013-10-19 VITALS — BP 124/78 | HR 90 | Temp 98.0°F | Resp 10 | Wt 232.0 lb

## 2013-10-19 DIAGNOSIS — IMO0001 Reserved for inherently not codable concepts without codable children: Secondary | ICD-10-CM

## 2013-10-19 DIAGNOSIS — Z23 Encounter for immunization: Secondary | ICD-10-CM

## 2013-10-19 DIAGNOSIS — E1165 Type 2 diabetes mellitus with hyperglycemia: Secondary | ICD-10-CM

## 2013-10-19 DIAGNOSIS — IMO0002 Reserved for concepts with insufficient information to code with codable children: Secondary | ICD-10-CM

## 2013-10-19 MED ORDER — GABAPENTIN 100 MG PO CAPS: 100.0000 mg | ORAL_CAPSULE | Freq: Three times a day (TID) | ORAL | Status: DC

## 2013-10-19 NOTE — Progress Notes (Signed)
Subjective:     Patient ID: [name], female   DOB: 1966/08/16, 47 y.o.   MRN: [id number]  HPI Pt. returns for for follow up of DM2, insulin-dependent,uncontrolled, with complications (neuropathy), dx Spring 2013. She is on an insulin pump. Last visit 4 mo ago. Worked in my schedule today because of rash on feet.  She has a rash on bilateral feet + swelling >> started 4 days ago >> not new socks and shoes, no new detergent. She tried Benadryl cream, [place], anti-itch cream >> no results.   Last HbA1C: Lab Results  Component Value Date   HGBA1C 7.8* 05/27/2013  Decreased from 9.4% before starting the pump.  She has a Medtronic MiniMed 530G pump, not  connected to the Enlite sensor.    Her settings are unchanged from our last visit: - basal rates: 1.2 units/h >> 28.8 units/day - bolus wizard on - active insulin time: 4 h - target CBG: 150 - ICR: 1:7.5 - ISF: 20 Per review of her pump, she still has 7 days in a row in which she does not bolus at all.   She checks her sugars 6x a day - brings a log: - am: 190-200 >> 130-140 >> 200s >> 124-337 - pre-lunch: 190-200s >> 190 >> 180-200s >> 134-292 - pre-dinner: 190-200s >> 150-170, highest 220 >> 200s >> 175-200  Last foot exam was 9 mo ago with Dr. Jeanice Lim.  No diabetic nephropathy. Last BUN/Cr: Lab Results  Component Value Date   BUN 11 07/20/2013   CREATININE 0.67 07/20/2013  She has neuropathy - stopped Neurontin 2 mo ago, did not start Lyrica.  Last eye exam was 9 mo ago (Dr. Jerolyn Center) >> reportedly no DR. Last Lipid panel: Lab Results  Component Value Date   CHOL 186 07/20/2013   HDL 47 07/20/2013   LDLCALC 91 07/20/2013   TRIG 241* 07/20/2013   CHOLHDL 4.0 07/20/2013  She is on a statin.  I reviewed pt's medications, allergies, PMH, social hx, family hx and no changes required, except as mentioned above.  Review of Systems Constitutional: no weight gain/loss, + fatigue, hot flashes Eyes: no blurry vision, no  xerophthalmia ENT: no sore throat, no nodules palpated in throat, no dysphagia/odynophagia, + hoarseness Cardiovascular: no CP/SOB/palpitations/+ leg swelling Respiratory: no cough/SOB Gastrointestinal: no N/V/D/C Musculoskeletal: no  muscle joint aches Skin:+ rash and swelling/bilateral feet.  Neurological: no tremors/numbness/tingling/dizziness Low libido     Objective:   Physical Exam BP 124/78  Pulse 90  Temp(Src) 98 F (36.7 C) (Oral)  Resp 10  Wt 232 lb (105.235 kg)  SpO2 97% Wt Readings from Last 3 Encounters:  10/19/13 232 lb (105.235 kg)  07/15/13 233 lb (105.688 kg)  05/18/13 233 lb (105.688 kg)  Constitutional: obesity class 3, in NAD Eyes: PERRLA, EOMI, no exophthalmos ENT: moist mucous membranes, no thyromegaly, no cervical lymphadenopathy Cardiovascular: RRR, No MRG Respiratory: CTA B Gastrointestinal: abdomen soft, NT, ND, BS+ Musculoskeletal: no deformities, strength intact in all 4 Skin: moist, warm Feet: bilateral swelling, excoriations on both feet. None appears infected. No lesions/burrows between toes.  Assessment:     1. DM2 - insulin-dependent, uncontrolled - now on insulin pump: Medtronic, MiniMed 530G pump  2. Peripheral neuropathy - on neurontin, not working >> stopped stopped 4 mo ago.  - right leg     Plan:     1. DM2 - Patient is not fully benefiting from her insulin pump - She brings a sugar log today and she does  check her sugars at least 3x a day, but per review of her pump, she still has large gaps of even 7 days without any bolus! This is likely the reason for her higher sugars, mostly in the 160-200s.  - For now, I advised her to keep the same insulin settings but bolus with all main meals based on her insulin to carb ratio as advised by the bolus wizard - I will check a hemoglobin A1c - will see her back in 2 mo  2. Peripheral neuropathy - mostly in right leg, sensation of torsion and burning in the whole leg  - not on neurontin  but did not start Lyrica - discussed restart neurontin >> sent a new Rx to pharmacy - she did not get the combination for neuropathy recommended at last visit - alpha-lipoic acid 600 mg twice a day - Metanx (can get it cheaper from [employer] Direct online) 1 tablet twice a day  3. Rash on feet - unclear etiology, likely peripheral neuropathy exacerbation, with swelling >> itching >> now excoriations on both feet. None appears infected. No lesions/burrows between toes. - Advised to: Soak your feet in oatmeal bath. Also try to apply Triamcinolone cream (Athlete's foot cream) - just in case this is fungal, although I doubt Start Neurontin again.  Office Visit on 10/19/2013  Component Date Value Range Status  . Hemoglobin A1C 10/19/2013 7.3* 4.6 - 6.5 % Final   Glycemic Control Guidelines for People with Diabetes:Non Diabetic:  <6%Goal of Therapy: <7%Additional Action Suggested:  >8%    Improved. Will call pt.

## 2013-10-19 NOTE — Patient Instructions (Signed)
Please bolus for your meals every day and for every meal. Soak your feet in oatmeal bath. Also try to apply Triamcinolone cream (Athlete's foot cream). Start Neurontin again. I will send this to your pharmacy. Please return in 2 months with your sugar log.

## 2013-10-19 NOTE — Telephone Encounter (Signed)
Pt called, she has left a message with [name] but also called back. She is diabetic with open sores on feet. Not able to walk well with these sores. i have scheduled her an appt at 1145am to see Dr. Elvera Lennox today / Oneita Kras.

## 2013-10-19 NOTE — Telephone Encounter (Signed)
OK 

## 2013-10-20 ENCOUNTER — Telehealth: Payer: Self-pay | Admitting: *Deleted

## 2013-10-20 LAB — HEMOGLOBIN A1C: Hgb A1c MFr Bld: 7.3 % — ABNORMAL HIGH (ref 4.6–6.5)

## 2013-10-20 NOTE — Telephone Encounter (Signed)
It will get better with the tx's suggested, especially the neurontin. Let me know in 2-3 days how she feels.

## 2013-10-20 NOTE — Telephone Encounter (Signed)
What about working?

## 2013-10-20 NOTE — Telephone Encounter (Signed)
Called pt and advised her that she can get something OTC with triamcinolone as an active ingredient. Pt said she would check with the pharmacist. Advised pt if no better in 2-3 days to contact us.

## 2013-10-20 NOTE — Telephone Encounter (Signed)
Pt called and asked if Dr Elvera Lennox would call/send in the prescription for the Triamcinolone cream. Pt stated that she is on her feet 12 to 16 hrs a day and wants to know if it's ok to work? Please advise.

## 2013-10-20 NOTE — Telephone Encounter (Signed)
This is OTC >> she can get any formulation she can find with this active ingredient in it. No Rx needed.

## 2013-10-27 ENCOUNTER — Telehealth: Payer: Self-pay | Admitting: *Deleted

## 2013-10-27 NOTE — Telephone Encounter (Signed)
Pt called stating that her feet are not any better. They are still burning on the bottom and itching. The triamcinolone cream has not helped. Pt wants to know if there is anything else that Dr Elvera Lennox can do to help? Pt is taking the gabapentin as well. Please advise.

## 2013-10-27 NOTE — Telephone Encounter (Signed)
Can increase Gabapentin to 300 mg tid - just pay attention to possible sedation.

## 2013-10-28 ENCOUNTER — Telehealth: Payer: Self-pay | Admitting: Internal Medicine

## 2013-10-28 NOTE — Telephone Encounter (Signed)
Called pt and lvm advising her to increase the Gabepentin 300 mg to tid; Dr Elvera Lennox advised to pay attention to possible sedation. Please let us know how you're doing in a few days.

## 2013-10-28 NOTE — Telephone Encounter (Signed)
Returning nurses call Call back [phone]  Thank You :)

## 2013-10-28 NOTE — Telephone Encounter (Signed)
Please tell her to try to see if her PCP can help further. I am not aware what we can use other than anti-itch cream (OTC).

## 2013-10-28 NOTE — Telephone Encounter (Signed)
Returned call to pt and she said she will increase her Gabapentin, but she cannot hardly stand the itching and wants to know if there is anything Dr Elvera Lennox can prescribe to help. Nothing OTC is working. Please advise.

## 2013-10-29 NOTE — Telephone Encounter (Signed)
Called pt and advised her per Dr Elvera Lennox that she should see if her PCP can help further. She is unaware what we can use other than an anti-itch cream (OTC) for this. Pt stated she did call them, they suggested she talk with her Diabetes dr. She stated she may call a wound dr to see if they could suggest something. Be advised.

## 2013-10-31 ENCOUNTER — Emergency Department (HOSPITAL_COMMUNITY)
Admission: EM | Admit: 2013-10-31 | Discharge: 2013-10-31 | Disposition: A | Discharge status: Home / Self Care | Payer: BC Managed Care – PPO | Attending: Emergency Medicine | Admitting: Emergency Medicine

## 2013-10-31 ENCOUNTER — Emergency Department (HOSPITAL_COMMUNITY): Payer: BC Managed Care – PPO

## 2013-10-31 ENCOUNTER — Encounter (HOSPITAL_COMMUNITY): Payer: Self-pay | Admitting: Emergency Medicine

## 2013-10-31 DIAGNOSIS — M25579 Pain in unspecified ankle and joints of unspecified foot: Secondary | ICD-10-CM | POA: Insufficient documentation

## 2013-10-31 DIAGNOSIS — Z7982 Long term (current) use of aspirin: Secondary | ICD-10-CM | POA: Insufficient documentation

## 2013-10-31 DIAGNOSIS — Z792 Long term (current) use of antibiotics: Secondary | ICD-10-CM | POA: Insufficient documentation

## 2013-10-31 DIAGNOSIS — E119 Type 2 diabetes mellitus without complications: Secondary | ICD-10-CM | POA: Insufficient documentation

## 2013-10-31 DIAGNOSIS — IMO0002 Reserved for concepts with insufficient information to code with codable children: Secondary | ICD-10-CM | POA: Insufficient documentation

## 2013-10-31 DIAGNOSIS — Z794 Long term (current) use of insulin: Secondary | ICD-10-CM | POA: Insufficient documentation

## 2013-10-31 DIAGNOSIS — Z79899 Other long term (current) drug therapy: Secondary | ICD-10-CM | POA: Insufficient documentation

## 2013-10-31 DIAGNOSIS — L089 Local infection of the skin and subcutaneous tissue, unspecified: Secondary | ICD-10-CM

## 2013-10-31 DIAGNOSIS — L309 Dermatitis, unspecified: Secondary | ICD-10-CM

## 2013-10-31 DIAGNOSIS — L259 Unspecified contact dermatitis, unspecified cause: Secondary | ICD-10-CM | POA: Insufficient documentation

## 2013-10-31 DIAGNOSIS — Z880 Allergy status to penicillin: Secondary | ICD-10-CM | POA: Insufficient documentation

## 2013-10-31 LAB — GLUCOSE, CAPILLARY: Glucose-Capillary: 92 mg/dL (ref 70–99)

## 2013-10-31 MED ORDER — PREDNISONE 10 MG PO TABS: 40.0000 mg | ORAL_TABLET | Freq: Every day | ORAL | Status: DC

## 2013-10-31 MED ORDER — DOXYCYCLINE HYCLATE 100 MG PO CAPS: 100.0000 mg | ORAL_CAPSULE | Freq: Two times a day (BID) | ORAL | Status: DC

## 2013-10-31 MED ORDER — DIPHENHYDRAMINE HCL 25 MG PO CAPS
25.0000 mg | ORAL_CAPSULE | Freq: Once | ORAL | Status: AC
  Administered 2013-10-31: 25 mg via ORAL
  Filled 2013-10-31: qty 1

## 2013-10-31 MED ORDER — DIPHENHYDRAMINE HCL 25 MG PO TABS: 25.0000 mg | ORAL_TABLET | Freq: Four times a day (QID) | ORAL | Status: DC

## 2013-10-31 NOTE — ED Notes (Signed)
Pt denies injury to right foot, painful, " feels like something crawling in it", swollen, +pulses, warm to touch, small area on top red, white pus.

## 2013-10-31 NOTE — ED Provider Notes (Signed)
CSN: [id number]     Arrival date & time 10/31/13  1923 History   First MD Initiated Contact with Patient 10/31/13 1942     Chief Complaint  Patient presents with  . Wound Check  . Diabetes   (Consider location/radiation/quality/duration/timing/severity/associated sxs/prior Treatment) Patient is a 47 y.o. female presenting with wound check and diabetes problem. The history is provided by the patient.  Wound Check Pertinent negatives include no abdominal pain and no shortness of breath.  Diabetes Pertinent negatives include no abdominal pain and no shortness of breath.   patient is a known diabetic. Patient has developed itchy lesions on both feet for the ankle on down. Seen by her diabetic doctor recently thought maybe it could be an allergic type reaction recommended soaking the feet and oatmeal bath. Now patient is a developing some pain and discomfort in the right foot. Patient has been unable to prevent herself from scratching the lesions at night. No other lesions anywhere else on her body no other systemic symptoms.  Past Medical History  Diagnosis Date  . Diabetes mellitus    Past Surgical History  Procedure Laterality Date  . Cesarean section  1994;2000    [place]  . Cholecystectomy  1992  . Sinus sergery  1988    [place]  . Foot surgery  March, 2013    [facility]  . Hysteroscopy with thermachoice  04/25/2012    Procedure: HYSTEROSCOPY WITH THERMACHOICE;  Surgeon: Lazaro Arms, MD;  Location: [facility];  Service: Gynecology;  Laterality: N/A;  total therapy time= 11 minutes 42 seconds; 34 ml D5w  in and 34 ml D5w out; temp =87 degrees F  . Cystoscopy w/ ureteral stent placement  05/08/2012    Procedure: CYSTOSCOPY WITH RETROGRADE PYELOGRAM/URETERAL STENT PLACEMENT;  Surgeon: Ky Barban, MD;  Location: [facility];  Service: Urology;  Laterality: Right;   Family History  Problem Relation Age of Onset  . Heart disease    . Cancer    . Diabetes    . Achalasia    .  Anesthesia problems Neg Hx   . Hypotension Neg Hx   . Malignant hyperthermia Neg Hx   . Pseudochol deficiency Neg Hx   . Diabetes Mother   . Depression Paternal Grandmother   . Depression Paternal Grandfather    History  Substance Use Topics  . Smoking status: Never Smoker   . Smokeless tobacco: Never Used  . Alcohol Use: No   OB History   Grav Para Term Preterm Abortions TAB SAB Ect Mult Living                 Review of Systems  Constitutional: Negative for fever.  HENT: Negative for congestion.   Eyes: Negative for redness.  Respiratory: Negative for shortness of breath.   Gastrointestinal: Negative for nausea, vomiting and abdominal pain.  Genitourinary: Negative for dysuria.  Musculoskeletal: Negative for back pain.  Skin: Positive for rash and wound.  Allergic/Immunologic: Negative for environmental allergies and food allergies.  Hematological: Does not bruise/bleed easily.  Psychiatric/Behavioral: Negative for confusion.    Allergies  Ciprofloxacin; Penicillins; Codeine; Morphine; and Sulfonamide derivatives  Home Medications   Current Outpatient Rx  Name  Route  Sig  Dispense  Refill  . ascorbic acid (VITAMIN C) 250 MG CHEW   Oral   Chew 250 mg by mouth daily.          Marland Kitchen aspirin EC 81 MG EC tablet   Oral   Take 1 tablet (  81 mg total) by mouth daily.   30 tablet   1   . DULoxetine (CYMBALTA) 60 MG capsule   Oral   Take 1 capsule (60 mg total) by mouth daily.   30 capsule   3   . gabapentin (NEURONTIN) 100 MG capsule   Oral   Take 1 capsule (100 mg total) by mouth 3 (three) times daily.   90 capsule   3   . insulin aspart (NOVOLOG) 100 UNIT/ML injection      Use in the insulin pump, approx. 100 units a day   40 mL   3   . metFORMIN (GLUCOPHAGE) 500 MG tablet   Oral   Take 1,000 mg by mouth 2 (two) times daily with a meal.          . nystatin (MYCOSTATIN/NYSTOP) 100000 UNIT/GM POWD   Topical   Apply 1 g topically 3 (three) times daily  as needed. For rash         . simvastatin (ZOCOR) 10 MG tablet   Oral   Take 1 tablet (10 mg total) by mouth at bedtime. For cholesterol   30 tablet   3   . diphenhydrAMINE (BENADRYL) 25 MG tablet   Oral   Take 1 tablet (25 mg total) by mouth every 6 (six) hours.   20 tablet   0   . doxycycline (VIBRAMYCIN) 100 MG capsule   Oral   Take 1 capsule (100 mg total) by mouth 2 (two) times daily.   14 capsule   0   . glucagon (GLUCAGON EMERGENCY) 1 MG injection      Inject in the muscle as needed for hypoglycemia   1 each   2   . Lidocaine HCl 4 % SOLN      Use as needed with pump insertions   Patient needs a prescription hand written.   1 vial   3   . nitroGLYCERIN (NITROSTAT) 0.4 MG SL tablet   Sublingual   Place 1 tablet (0.4 mg total) under the tongue every 5 (five) minutes as needed for chest pain. Go to ER if 2nd dose needed   20 tablet   1   . predniSONE (DELTASONE) 10 MG tablet   Oral   Take 4 tablets (40 mg total) by mouth daily.   20 tablet   0    BP 114/52  Pulse 80  Temp(Src) 99.6 F (37.6 C) (Rectal)  Resp 18  Ht 4\' 8"  (1.422 m)  Wt 220 lb (99.791 kg)  BMI 49.35 kg/m2  SpO2 100% Physical Exam  Nursing note and vitals reviewed. Constitutional: She is oriented to person, place, and time. She appears well-developed and well-nourished. No distress.  HENT:  Head: Normocephalic and atraumatic.  Mouth/Throat: Oropharynx is clear and moist.  Eyes: Conjunctivae and EOM are normal. Pupils are equal, round, and reactive to light.  Neck: Normal range of motion.  Cardiovascular: Normal rate, regular rhythm and normal heart sounds.   No murmur heard. Pulmonary/Chest: Effort normal and breath sounds normal. No respiratory distress.  Abdominal: Soft. Bowel sounds are normal. There is no tenderness.  Musculoskeletal: She exhibits edema and tenderness.  Normal except for both feet at the ankle on down has several skin lesions measuring about 5 mm in size some  have been excoriated to scratching. Some swelling minimal erythema Refill in both big toes is excellent less than 1 second. Patient's right foot forefoot anteriorly with an area of a pustule scabbing over somewhat with  some erythema and some mild tenderness. No evidence of significant cellulitis. No ulcers.  Neurological: She is alert and oriented to person, place, and time. No cranial nerve deficit. She exhibits normal muscle tone. Coordination normal.  Skin: Skin is warm. Rash noted. There is erythema.    ED Course  Procedures (including critical care time) Labs Review Labs Reviewed  GLUCOSE, CAPILLARY   Imaging Review Dg Foot Complete Right  10/31/2013   CLINICAL DATA:  Anterior foot pain for 3 weeks at the 2nd metatarsophalangeal joint at wound  EXAM: RIGHT FOOT COMPLETE - 3+ VIEW  COMPARISON:  None.  FINDINGS: There is no evidence of fracture or dislocation. There is no evidence of arthropathy or other focal bone abnormality. Soft tissues are unremarkable.  IMPRESSION: Negative.   Electronically Signed   By: Esperanza Heir M.D.   On: 10/31/2013 21:06    EKG Interpretation   None       MDM   1. Foot infection   2. Dermatitis    Patient history of diabetes. The bilateral foot of skin lesions on the feet suggestive of a dermatitis since his itching component. The right foot anterior surface down near the toes may have a pustule with some superficial infection. X-rays of the foot without any bony abnormalities. Will treat with an antibiotic doxycycline for the next few days. The patient continued to soak her feet. Have patient try Benadryl for the itching also given a prescription for prednisone and Benadryl does not help with the itching. Patient is aware that the prednisone will increase her blood sugars. Suspect that some of this is a dermatitis particularly since it has the itching. May have developed an allergy to something in her socks. She has none of these lesions anywhere else.  They stop at the ankle line at the top of socks.     Shelda Jakes, MD 10/31/13 2114

## 2013-10-31 NOTE — ED Notes (Signed)
Pt states with several sores to bilateral feet, c/o burning and itching to bottoms of feet, last CBG check done by pt at 1700 and was 221

## 2013-10-31 NOTE — ED Notes (Signed)
Patient given discharge instruction, verbalized understand. Patient ambulatory out of the department.  

## 2013-10-31 NOTE — ED Notes (Addendum)
Family at the bedside.

## 2013-11-03 NOTE — Telephone Encounter (Signed)
Pt called and states that she is having some sores and swelling on her foot still. Please advise.  Call back [phone]

## 2013-11-03 NOTE — Telephone Encounter (Signed)
Pt would like to have Dr.Gherghe nurse please call her back by end of day today She states she is having some sores and swelling on her foot  Call back [phone]  Thank You :)

## 2013-11-04 ENCOUNTER — Telehealth: Payer: Self-pay | Admitting: *Deleted

## 2013-11-04 NOTE — Telephone Encounter (Signed)
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

## 2013-11-04 NOTE — Telephone Encounter (Signed)
Called [facility] at [phone] and scheduled an appt for pt. Scheduled for Wednesday, Dec. 3rd at 9:30 am with Dr Leeanne Deed. Called pt and gave her the address, time and date of appt, advised pt to be there at 9:00 am to fill out new pt paperwork. Advised pt that they have her on the cancellation list and they will call her if they have any. Pt understood.

## 2013-11-04 NOTE — Telephone Encounter (Signed)
She's probably be off for Thanksgiving >> will see what the podiatrist says, I would like to defer to him this decision.

## 2013-11-04 NOTE — Telephone Encounter (Signed)
Will let the podiatrist decide about time off - please have her take Ibuprofen 400-800 before her shift, also continue Neurontin.

## 2013-11-04 NOTE — Telephone Encounter (Signed)
Called pt and advised that Dr Elvera Lennox said: she will let the podiatrist decide about time off, please take Ibuprofen 400-800mg  before her shift, also continue Neurontin. Pt understood.

## 2013-11-04 NOTE — Telephone Encounter (Signed)
THANK YOU SO MUCH, Shannon!

## 2013-11-04 NOTE — Telephone Encounter (Signed)
I will need to have her see a podiatrist for any other ideas. [name], can you make this referral? Thanks so much!

## 2013-11-04 NOTE — Telephone Encounter (Signed)
Pt called back asking if continuing to be on her feet for 12 + hours a day is okay. If not, can she get a work note to be out? Please advise.

## 2013-11-11 ENCOUNTER — Ambulatory Visit: Payer: BC Managed Care – PPO | Admitting: Podiatry

## 2013-11-13 ENCOUNTER — Ambulatory Visit: Payer: No Typology Code available for payment source | Admitting: Internal Medicine

## 2013-11-24 ENCOUNTER — Telehealth: Payer: Self-pay | Admitting: Family Medicine

## 2013-11-24 MED ORDER — CLOTRIMAZOLE-BETAMETHASONE 1-0.05 % EX CREA: 1.0000 "application " | TOPICAL_CREAM | Freq: Two times a day (BID) | CUTANEOUS | Status: DC

## 2013-11-24 MED ORDER — NYSTATIN 100000 UNIT/GM EX POWD: 1.0000 g | Freq: Three times a day (TID) | CUTANEOUS | Status: DC | PRN

## 2013-11-24 NOTE — Telephone Encounter (Signed)
Called pt she is needing a refill on her nystatin cream and lotrisone cream, she is having an irritation under breast again and only uses it as needed.

## 2013-11-24 NOTE — Telephone Encounter (Signed)
Okay to refill, pt also needs an OV

## 2013-11-24 NOTE — Telephone Encounter (Signed)
Med refilled.

## 2013-11-24 NOTE — Telephone Encounter (Signed)
PLEASE CALL

## 2013-12-11 ENCOUNTER — Other Ambulatory Visit: Payer: Self-pay | Admitting: Family Medicine

## 2013-12-11 ENCOUNTER — Other Ambulatory Visit: Payer: Self-pay | Admitting: *Deleted

## 2013-12-11 ENCOUNTER — Other Ambulatory Visit (HOSPITAL_COMMUNITY): Payer: Self-pay | Admitting: Internal Medicine

## 2013-12-11 DIAGNOSIS — Z139 Encounter for screening, unspecified: Secondary | ICD-10-CM

## 2013-12-11 MED ORDER — INSULIN LISPRO 100 UNIT/ML ~~LOC~~ SOLN: SUBCUTANEOUS | Status: DC

## 2013-12-11 NOTE — Telephone Encounter (Signed)
Pt called stating her husband's work has changed ins companies. She needs to change from novolog to humalog and wants to know if this is ok. Advised pt this is ok. Formulary change.

## 2013-12-28 ENCOUNTER — Ambulatory Visit (INDEPENDENT_AMBULATORY_CARE_PROVIDER_SITE_OTHER): Payer: BC Managed Care – PPO | Admitting: Ophthalmology

## 2013-12-31 ENCOUNTER — Other Ambulatory Visit: Payer: Self-pay | Admitting: Obstetrics & Gynecology

## 2014-01-04 ENCOUNTER — Other Ambulatory Visit: Payer: BC Managed Care – PPO | Admitting: Obstetrics & Gynecology

## 2014-01-06 ENCOUNTER — Other Ambulatory Visit: Payer: Self-pay | Admitting: Obstetrics & Gynecology

## 2014-01-11 ENCOUNTER — Ambulatory Visit (HOSPITAL_COMMUNITY): Payer: Self-pay

## 2014-01-14 ENCOUNTER — Ambulatory Visit (HOSPITAL_COMMUNITY): Payer: Self-pay

## 2014-01-15 ENCOUNTER — Ambulatory Visit (HOSPITAL_COMMUNITY)
Admission: RE | Admit: 2014-01-15 | Discharge: 2014-01-15 | Disposition: A | Discharge status: Home / Self Care | Payer: Managed Care, Other (non HMO) | Source: Ambulatory Visit | Attending: Internal Medicine | Admitting: Internal Medicine

## 2014-01-15 DIAGNOSIS — Z139 Encounter for screening, unspecified: Secondary | ICD-10-CM

## 2014-01-15 DIAGNOSIS — Z1231 Encounter for screening mammogram for malignant neoplasm of breast: Secondary | ICD-10-CM | POA: Insufficient documentation

## 2014-01-19 ENCOUNTER — Ambulatory Visit: Payer: Self-pay | Admitting: Internal Medicine

## 2014-01-20 ENCOUNTER — Other Ambulatory Visit: Payer: Self-pay | Admitting: Internal Medicine

## 2014-01-20 DIAGNOSIS — R928 Other abnormal and inconclusive findings on diagnostic imaging of breast: Secondary | ICD-10-CM

## 2014-01-25 ENCOUNTER — Other Ambulatory Visit (HOSPITAL_COMMUNITY): Payer: Self-pay | Admitting: General Surgery

## 2014-01-25 DIAGNOSIS — R1909 Other intra-abdominal and pelvic swelling, mass and lump: Secondary | ICD-10-CM

## 2014-01-27 ENCOUNTER — Ambulatory Visit (HOSPITAL_COMMUNITY)
Admission: RE | Admit: 2014-01-27 | Discharge: 2014-01-27 | Disposition: A | Discharge status: Home / Self Care | Payer: Managed Care, Other (non HMO) | Source: Ambulatory Visit | Attending: Internal Medicine | Admitting: Internal Medicine

## 2014-01-27 DIAGNOSIS — R928 Other abnormal and inconclusive findings on diagnostic imaging of breast: Secondary | ICD-10-CM | POA: Insufficient documentation

## 2014-01-29 ENCOUNTER — Ambulatory Visit (HOSPITAL_COMMUNITY)
Admission: RE | Admit: 2014-01-29 | Discharge: 2014-01-29 | Disposition: A | Discharge status: Home / Self Care | Payer: Private Health Insurance - Indemnity | Source: Ambulatory Visit | Attending: General Surgery | Admitting: General Surgery

## 2014-01-29 ENCOUNTER — Encounter (HOSPITAL_COMMUNITY): Payer: Self-pay

## 2014-01-29 DIAGNOSIS — M5137 Other intervertebral disc degeneration, lumbosacral region: Secondary | ICD-10-CM | POA: Insufficient documentation

## 2014-01-29 DIAGNOSIS — M51379 Other intervertebral disc degeneration, lumbosacral region without mention of lumbar back pain or lower extremity pain: Secondary | ICD-10-CM | POA: Insufficient documentation

## 2014-01-29 DIAGNOSIS — R1909 Other intra-abdominal and pelvic swelling, mass and lump: Secondary | ICD-10-CM | POA: Insufficient documentation

## 2014-01-29 DIAGNOSIS — K429 Umbilical hernia without obstruction or gangrene: Secondary | ICD-10-CM | POA: Insufficient documentation

## 2014-01-29 LAB — POCT I-STAT CREATININE: Creatinine, Ser: 0.6 mg/dL (ref 0.50–1.10)

## 2014-01-29 MED ORDER — IOHEXOL 300 MG/ML  SOLN
100.0000 mL | Freq: Once | INTRAMUSCULAR | Status: AC | PRN
  Administered 2014-01-29: 100 mL via INTRAVENOUS

## 2014-02-01 ENCOUNTER — Encounter (HOSPITAL_COMMUNITY): Payer: Self-pay | Admitting: Pharmacy Technician

## 2014-02-19 ENCOUNTER — Inpatient Hospital Stay (HOSPITAL_COMMUNITY)
Admission: EM | Admit: 2014-02-19 | Discharge: 2014-02-22 | DRG: 638 | Disposition: A | Discharge status: Home / Self Care | Payer: Managed Care, Other (non HMO) | Attending: Internal Medicine | Admitting: Internal Medicine

## 2014-02-19 ENCOUNTER — Emergency Department (HOSPITAL_COMMUNITY): Payer: Managed Care, Other (non HMO)

## 2014-02-19 ENCOUNTER — Encounter (HOSPITAL_COMMUNITY): Payer: Self-pay | Admitting: Emergency Medicine

## 2014-02-19 DIAGNOSIS — E782 Mixed hyperlipidemia: Secondary | ICD-10-CM | POA: Diagnosis present

## 2014-02-19 DIAGNOSIS — B372 Candidiasis of skin and nail: Secondary | ICD-10-CM

## 2014-02-19 DIAGNOSIS — L97509 Non-pressure chronic ulcer of other part of unspecified foot with unspecified severity: Secondary | ICD-10-CM | POA: Diagnosis present

## 2014-02-19 DIAGNOSIS — E669 Obesity, unspecified: Secondary | ICD-10-CM

## 2014-02-19 DIAGNOSIS — E785 Hyperlipidemia, unspecified: Secondary | ICD-10-CM

## 2014-02-19 DIAGNOSIS — IMO0002 Reserved for concepts with insufficient information to code with codable children: Secondary | ICD-10-CM

## 2014-02-19 DIAGNOSIS — R51 Headache: Secondary | ICD-10-CM

## 2014-02-19 DIAGNOSIS — E1169 Type 2 diabetes mellitus with other specified complication: Principal | ICD-10-CM | POA: Diagnosis present

## 2014-02-19 DIAGNOSIS — Z7982 Long term (current) use of aspirin: Secondary | ICD-10-CM

## 2014-02-19 DIAGNOSIS — R102 Pelvic and perineal pain: Secondary | ICD-10-CM

## 2014-02-19 DIAGNOSIS — N200 Calculus of kidney: Secondary | ICD-10-CM

## 2014-02-19 DIAGNOSIS — Z9641 Presence of insulin pump (external) (internal): Secondary | ICD-10-CM

## 2014-02-19 DIAGNOSIS — Z23 Encounter for immunization: Secondary | ICD-10-CM

## 2014-02-19 DIAGNOSIS — Z6841 Body Mass Index (BMI) 40.0 and over, adult: Secondary | ICD-10-CM

## 2014-02-19 DIAGNOSIS — Z833 Family history of diabetes mellitus: Secondary | ICD-10-CM

## 2014-02-19 DIAGNOSIS — L03119 Cellulitis of unspecified part of limb: Secondary | ICD-10-CM | POA: Diagnosis present

## 2014-02-19 DIAGNOSIS — F32A Depression, unspecified: Secondary | ICD-10-CM

## 2014-02-19 DIAGNOSIS — L02619 Cutaneous abscess of unspecified foot: Secondary | ICD-10-CM | POA: Diagnosis present

## 2014-02-19 DIAGNOSIS — Z6838 Body mass index (BMI) 38.0-38.9, adult: Secondary | ICD-10-CM | POA: Diagnosis present

## 2014-02-19 DIAGNOSIS — F329 Major depressive disorder, single episode, unspecified: Secondary | ICD-10-CM

## 2014-02-19 DIAGNOSIS — L0291 Cutaneous abscess, unspecified: Secondary | ICD-10-CM

## 2014-02-19 DIAGNOSIS — L0390 Cellulitis, unspecified: Secondary | ICD-10-CM

## 2014-02-19 DIAGNOSIS — I10 Essential (primary) hypertension: Secondary | ICD-10-CM

## 2014-02-19 DIAGNOSIS — R6882 Decreased libido: Secondary | ICD-10-CM

## 2014-02-19 DIAGNOSIS — Z881 Allergy status to other antibiotic agents status: Secondary | ICD-10-CM

## 2014-02-19 DIAGNOSIS — Z794 Long term (current) use of insulin: Secondary | ICD-10-CM

## 2014-02-19 DIAGNOSIS — IMO0001 Reserved for inherently not codable concepts without codable children: Secondary | ICD-10-CM

## 2014-02-19 DIAGNOSIS — Z87442 Personal history of urinary calculi: Secondary | ICD-10-CM

## 2014-02-19 DIAGNOSIS — E1165 Type 2 diabetes mellitus with hyperglycemia: Secondary | ICD-10-CM

## 2014-02-19 DIAGNOSIS — G629 Polyneuropathy, unspecified: Secondary | ICD-10-CM

## 2014-02-19 DIAGNOSIS — R0600 Dyspnea, unspecified: Secondary | ICD-10-CM

## 2014-02-19 HISTORY — DX: Calculus of kidney: N20.0

## 2014-02-19 LAB — CBC WITH DIFFERENTIAL/PLATELET
Basophils Absolute: 0 10*3/uL (ref 0.0–0.1)
Basophils Relative: 0 % (ref 0–1)
EOS ABS: 0.1 10*3/uL (ref 0.0–0.7)
EOS PCT: 1 % (ref 0–5)
HEMATOCRIT: 38.5 % (ref 36.0–46.0)
HEMOGLOBIN: 13.3 g/dL (ref 12.0–15.0)
LYMPHS ABS: 2.3 10*3/uL (ref 0.7–4.0)
Lymphocytes Relative: 21 % (ref 12–46)
MCH: 29.1 pg (ref 26.0–34.0)
MCHC: 34.5 g/dL (ref 30.0–36.0)
MCV: 84.2 fL (ref 78.0–100.0)
MONO ABS: 0.9 10*3/uL (ref 0.1–1.0)
Monocytes Relative: 8 % (ref 3–12)
Neutro Abs: 8 10*3/uL — ABNORMAL HIGH (ref 1.7–7.7)
Neutrophils Relative %: 71 % (ref 43–77)
Platelets: 302 10*3/uL (ref 150–400)
RBC: 4.57 MIL/uL (ref 3.87–5.11)
RDW: 13.4 % (ref 11.5–15.5)
WBC: 11.4 10*3/uL — ABNORMAL HIGH (ref 4.0–10.5)

## 2014-02-19 LAB — COMPREHENSIVE METABOLIC PANEL
ALK PHOS: 134 U/L — AB (ref 39–117)
ALT: 17 U/L (ref 0–35)
AST: 15 U/L (ref 0–37)
Albumin: 3.4 g/dL — ABNORMAL LOW (ref 3.5–5.2)
BUN: 10 mg/dL (ref 6–23)
CALCIUM: 8.9 mg/dL (ref 8.4–10.5)
CO2: 24 mEq/L (ref 19–32)
Chloride: 99 mEq/L (ref 96–112)
Creatinine, Ser: 0.57 mg/dL (ref 0.50–1.10)
GFR calc Af Amer: >90 mL/min (ref >90)
GFR calc non Af Amer: >90 mL/min (ref >90)
GLUCOSE: 326 mg/dL — AB (ref 70–99)
POTASSIUM: 3.8 meq/L (ref 3.7–5.3)
Sodium: 137 mEq/L (ref 137–147)
Total Bilirubin: 0.5 mg/dL (ref 0.3–1.2)
Total Protein: 7.4 g/dL (ref 6.0–8.3)

## 2014-02-19 LAB — CBG MONITORING, ED: Glucose-Capillary: 280 mg/dL — ABNORMAL HIGH (ref 70–99)

## 2014-02-19 MED ORDER — DIPHENHYDRAMINE HCL 50 MG/ML IJ SOLN
25.0000 mg | Freq: Once | INTRAMUSCULAR | Status: AC
  Administered 2014-02-19: 25 mg via INTRAVENOUS
  Administered 2014-02-19: 23:00:00 via INTRAVENOUS

## 2014-02-19 MED ORDER — DIPHENHYDRAMINE HCL 25 MG PO CAPS
25.0000 mg | ORAL_CAPSULE | Freq: Four times a day (QID) | ORAL | Status: DC
  Administered 2014-02-19 – 2014-02-22 (×11): 25 mg via ORAL
  Filled 2014-02-19 (×20): qty 1

## 2014-02-19 MED ORDER — HYDROCODONE-ACETAMINOPHEN 5-325 MG PO TABS
ORAL_TABLET | ORAL | Status: AC
  Filled 2014-02-19: qty 2

## 2014-02-19 MED ORDER — SODIUM CHLORIDE 0.9 % IV SOLN
Freq: Once | INTRAVENOUS | Status: AC
  Administered 2014-02-19: 21:00:00 via INTRAVENOUS

## 2014-02-19 MED ORDER — DIPHENHYDRAMINE HCL 25 MG PO CAPS
ORAL_CAPSULE | ORAL | Status: AC
  Administered 2014-02-19: 25 mg via ORAL
  Filled 2014-02-19: qty 1

## 2014-02-19 MED ORDER — LINEZOLID 2 MG/ML IV SOLN
600.0000 mg | Freq: Two times a day (BID) | INTRAVENOUS | Status: DC
  Filled 2014-02-19 (×6): qty 300

## 2014-02-19 MED ORDER — HYDROCODONE-ACETAMINOPHEN 5-325 MG PO TABS
2.0000 | ORAL_TABLET | Freq: Once | ORAL | Status: AC
  Administered 2014-02-19: 2 via ORAL

## 2014-02-19 MED ORDER — VANCOMYCIN HCL IN DEXTROSE 1-5 GM/200ML-% IV SOLN
1000.0000 mg | Freq: Once | INTRAVENOUS | Status: AC
  Administered 2014-02-19: 1000 mg via INTRAVENOUS
  Filled 2014-02-19: qty 200

## 2014-02-19 MED ORDER — DIPHENHYDRAMINE HCL 50 MG/ML IJ SOLN
INTRAMUSCULAR | Status: AC
  Administered 2014-02-19: 25 mg via INTRAVENOUS
  Filled 2014-02-19: qty 1

## 2014-02-19 NOTE — ED Notes (Signed)
Recent problems w/sores to tops of feet.. Monday, saw Dr. Orvan Falconer in [place] regarding same and he placed bilateral unaboots.  Patient reports R foot has progressively gotten more sore and redness has spread distally beyond unaboot edge.  States Dr. Orvan Falconer contemplated admitting her for IV antibx but decided to treat w/dressings and oral Keflex.

## 2014-02-19 NOTE — ED Provider Notes (Signed)
CSN: [id number]     Arrival date & time 02/19/14  1836 History  This chart was scribed for [name] Munch, MD by Ardelia Mems, ED Scribe. This patient was seen in room [facility]03[facility] and the patient's care was started at 8:23 PM.    No chief complaint on file.   The history is provided by the patient. No language interpreter was used.    HPI Comments: [name] is a 48 y.o. female with a history of DM who presents to the Emergency Department complaining of worsening redness and swelling to the dorsum of her right foot "for a while". She states that she has seen her PCP regarding these symptoms as well as an Actor and a Dermatologist for these symptoms. She states that she saw the Dermatologist (Dr. Orvan Falconer in [place]) 4 days ago, and was started on Keflex and had a medicated wrap applied to her foot. It was suspected by Dr. Orvan Falconer that the redness and swelling was related to pt's history of DM. She states that the redness and swelling has worsened since that visit. She reports an associated fever last night, with a Tmax of 100.1 F. She also reports associated nausea and intermittent chest tightness. She reports that she is on an insulin pump (since 2013) and that her blood sugar "stays high" despite this. CBG in the ED is 280. She denies chills, vomiting, SOB or any other symptoms.    Past Medical History  Diagnosis Date  . Diabetes mellitus     insulin pump 2013  . Kidney stone    Past Surgical History  Procedure Laterality Date  . Cesarean section  1994;2000    [place]  . Cholecystectomy  1992  . Sinus sergery  1988    [place]  . Foot surgery  March, 2013    [facility]  . Hysteroscopy with thermachoice  04/25/2012    Procedure: HYSTEROSCOPY WITH THERMACHOICE;  Surgeon: Lazaro Arms, MD;  Location: [facility];  Service: Gynecology;  Laterality: N/A;  total therapy time= 11 minutes 42 seconds; 34 ml D5w  in and 34 ml D5w out; temp =87 degrees F  . Cystoscopy w/ ureteral  stent placement  05/08/2012    Procedure: CYSTOSCOPY WITH RETROGRADE PYELOGRAM/URETERAL STENT PLACEMENT;  Surgeon: Ky Barban, MD;  Location: [facility];  Service: Urology;  Laterality: Right;   Family History  Problem Relation Age of Onset  . Heart disease    . Cancer    . Diabetes    . Achalasia    . Anesthesia problems Neg Hx   . Hypotension Neg Hx   . Malignant hyperthermia Neg Hx   . Pseudochol deficiency Neg Hx   . Diabetes Mother   . Depression Paternal Grandmother   . Depression Paternal Grandfather    History  Substance Use Topics  . Smoking status: Never Smoker   . Smokeless tobacco: Never Used  . Alcohol Use: No   OB History   Grav Para Term Preterm Abortions TAB SAB Ect Mult Living                 Review of Systems  Constitutional:       Per HPI, otherwise negative  HENT:       Per HPI, otherwise negative  Respiratory:       Per HPI, otherwise negative  Cardiovascular:       Per HPI, otherwise negative  Gastrointestinal: Negative for vomiting.  Endocrine:       Negative aside  from HPI  Genitourinary:       Neg aside from HPI   Musculoskeletal:       Per HPI, otherwise negative  Skin: Negative.   Neurological: Negative for syncope.    Allergies  Ciprofloxacin; Penicillins; Codeine; Morphine; and Sulfonamide derivatives  Home Medications   Current Outpatient Rx  Name  Route  Sig  Dispense  Refill  . ascorbic acid (VITAMIN C) 250 MG CHEW   Oral   Chew 250 mg by mouth daily.          Marland Kitchen aspirin EC 81 MG EC tablet   Oral   Take 1 tablet (81 mg total) by mouth daily.   30 tablet   1   . diphenhydrAMINE (BENADRYL) 25 MG tablet   Oral   Take 1 tablet (25 mg total) by mouth every 6 (six) hours.   20 tablet   0   . gabapentin (NEURONTIN) 100 MG capsule   Oral   Take 1 capsule (100 mg total) by mouth 3 (three) times daily.   90 capsule   3   . glucagon (GLUCAGON EMERGENCY) 1 MG injection      Inject in the muscle as needed for  hypoglycemia   1 each   2   . insulin lispro (HUMALOG) 100 UNIT/ML injection      Use in insulin pump, approx 100 units a day.   30 mL   3   . Lidocaine HCl 4 % SOLN      Use as needed with pump insertions   Patient needs a prescription hand written.   1 vial   3   . metFORMIN (GLUCOPHAGE) 500 MG tablet   Oral   Take 1,000 mg by mouth 2 (two) times daily with a meal.          . EXPIRED: nitroGLYCERIN (NITROSTAT) 0.4 MG SL tablet   Sublingual   Place 1 tablet (0.4 mg total) under the tongue every 5 (five) minutes as needed for chest pain. Go to ER if 2nd dose needed   20 tablet   1   . simvastatin (ZOCOR) 10 MG tablet   Oral   Take 1 tablet (10 mg total) by mouth at bedtime. For cholesterol   30 tablet   3    Triage Vitals: BP 131/70  Pulse 98  Temp(Src) 98.2 F (36.8 C) (Oral)  Resp 20  Ht 4\' 9"  (1.448 m)  Wt 192 lb (87.091 kg)  BMI 41.54 kg/m2  SpO2 97%  Physical Exam  Nursing note and vitals reviewed. Constitutional: She is oriented to person, place, and time. She appears well-developed and well-nourished. No distress.  HENT:  Head: Normocephalic and atraumatic.  Eyes: Conjunctivae and EOM are normal.  Cardiovascular: Normal rate and regular rhythm.   Pulmonary/Chest: Effort normal and breath sounds normal. No stridor. No respiratory distress.  Abdominal: She exhibits no distension.  Neurological: She is alert and oriented to person, place, and time. No cranial nerve deficit.  Skin: Skin is warm and dry.  Erythema and edema to the dorsum of her right foot. There is induration but no fluctuance.  Psychiatric: She has a normal mood and affect.    ED Course  Procedures (including critical care time)  DIAGNOSTIC STUDIES: Oxygen Saturation is 97% on RA, normal by my interpretation.    COORDINATION OF CARE: 8:29 PM- Discussed plan to obtain an X-ray of pt's right foot, along with possible plan for admission and IV antibiotics. Pt advised of  plan for  treatment and pt agrees.  Labs Review Labs Reviewed  CBC WITH DIFFERENTIAL - Abnormal; Notable for the following:    WBC 11.4 (*)    Neutro Abs 8.0 (*)    All other components within normal limits  COMPREHENSIVE METABOLIC PANEL - Abnormal; Notable for the following:    Glucose, Bld 326 (*)    Albumin 3.4 (*)    Alkaline Phosphatase 134 (*)    All other components within normal limits  CBG MONITORING, ED - Abnormal; Notable for the following:    Glucose-Capillary 280 (*)    All other components within normal limits   Imaging Review Dg Foot Complete Right  02/19/2014   CLINICAL DATA:  Right foot infection/cellulitis  EXAM: RIGHT FOOT COMPLETE - 3+ VIEW  COMPARISON:  October 31, 2013  FINDINGS: Frontal, oblique, and lateral views were obtained. The patient has had a previous bunionectomy with bony remodeling in the distal first metatarsal. There is no acute fracture or dislocation. Joint spaces appear intact. There is no erosive change or bony destruction. There is a small inferior calcaneal spur. There is soft tissue swelling throughout the dorsum of the foot.  IMPRESSION: Dorsal soft tissue swelling. Small inferior calcaneal spur. Status post bunionectomy.  No demonstrable erosive change or bony destruction. No fracture or dislocation.   Electronically Signed   By: Bretta BangWilliam  Woodruff M.D.   On: 02/19/2014 21:34     MDM    I personally performed the services described in this documentation, which was scribed in my presence. The recorded information has been reviewed and is accurate.  Patient presents with ongoing right foot lesion.  On exam is erythematous, and with nonhealing in spite of antibiotics, appropriate ongoing care, she required admission for further evaluation, management of her cellulitis.    [name] Munchobert Laylonie Marzec, MD 02/19/14 2154

## 2014-02-19 NOTE — ED Notes (Signed)
Report called to [place], RN on unit 300.

## 2014-02-19 NOTE — ED Notes (Signed)
Dr. Sharl Ma notified of patient reaction to Vancomycin, other allergies and of Dr. Gilmore Laroche order for Unasyn in light of patient's PCN allergy.  He will order Zyvox.

## 2014-02-19 NOTE — ED Notes (Signed)
Infection to rt foot , pt is diabetic, seen by MD on Monday and started on keflex and wrapped her foot, Increased swelling and redness.

## 2014-02-19 NOTE — H&P (Signed)
[facility] History and Physical  [name] ACZ:[id number] DOB: 1966/11/16 DOA: 02/19/2014  Referring physician:  PCP: Catalina Pizza, MD  Specialists:   Chief Complaint: R foot swelling, redness   HPI: [name] is a 48 y.o. female with PMH of IDDM, HTN, HPL presented with worsening redness and swelling to the dorsum of her right foot found to have cellulitis. She states that she has seen her PCP regarding these symptoms as well as an Actor and a Dermatologist for these symptoms. She states that she saw the Dermatologist (Dr. Orvan Falconer in [place]) 4 days ago, and was started on Keflex and had a medicated wrap applied to her foot.  -she also reports fever, chills, mild nausea no vomiting, no SOB, no chest pain    Review of Systems: The patient denies anorexia, fever, weight loss,, vision loss, decreased hearing, hoarseness, chest pain, syncope, dyspnea on exertion, peripheral edema, balance deficits, hemoptysis, abdominal pain, melena, hematochezia, severe indigestion/heartburn, hematuria, incontinence, genital sores, muscle weakness, suspicious skin lesions, transient blindness, difficulty walking, depression, unusual weight change, abnormal bleeding, enlarged lymph nodes, angioedema, and breast masses.    Past Medical History  Diagnosis Date  . Diabetes mellitus     insulin pump 2013  . Kidney stone    Past Surgical History  Procedure Laterality Date  . Cesarean section  1994;2000    [place]  . Cholecystectomy  1992  . Sinus sergery  1988    [place]  . Foot surgery  March, 2013    [facility]  . Hysteroscopy with thermachoice  04/25/2012    Procedure: HYSTEROSCOPY WITH THERMACHOICE;  Surgeon: Lazaro Arms, MD;  Location: [facility];  Service: Gynecology;  Laterality: N/A;  total therapy time= 11 minutes 42 seconds; 34 ml D5w  in and 34 ml D5w out; temp =87 degrees F  . Cystoscopy w/ ureteral stent placement  05/08/2012    Procedure: CYSTOSCOPY WITH RETROGRADE  PYELOGRAM/URETERAL STENT PLACEMENT;  Surgeon: Ky Barban, MD;  Location: [facility];  Service: Urology;  Laterality: Right;   Social History:  reports that she has never smoked. She has never used smokeless tobacco. She reports that she does not drink alcohol or use illicit drugs. Home;  where does patient live--home, ALF, SNF? and with whom if at home? Yes;  Can patient participate in ADLs?  Allergies  Allergen Reactions  . Ciprofloxacin Hives and Swelling  . Penicillins Anaphylaxis and Rash  . Codeine Other (See Comments)    REACTION:  had hallicunations  . Morphine Nausea And Vomiting and Other (See Comments)    REACTION:   recieved in ED due to [facility] and had multple doses - gave visual hallucinations and vomitting.  . Sulfonamide Derivatives Other (See Comments)    REACTION: Unsure - childhood allergy    Family History  Problem Relation Age of Onset  . Heart disease    . Cancer    . Diabetes    . Achalasia    . Anesthesia problems Neg Hx   . Hypotension Neg Hx   . Malignant hyperthermia Neg Hx   . Pseudochol deficiency Neg Hx   . Diabetes Mother   . Depression Paternal Grandmother   . Depression Paternal Grandfather     (be sure to complete)  Prior to Admission medications   Medication Sig Start Date End Date Taking? Authorizing Provider  ascorbic acid (VITAMIN C) 250 MG CHEW Chew 250 mg by mouth daily.     Historical Provider, MD  aspirin  EC 81 MG EC tablet Take 1 tablet (81 mg total) by mouth daily. 11/19/12   Erick Blinks, MD  diphenhydrAMINE (BENADRYL) 25 MG tablet Take 1 tablet (25 mg total) by mouth every 6 (six) hours. 10/31/13   Shelda Jakes, MD  gabapentin (NEURONTIN) 100 MG capsule Take 1 capsule (100 mg total) by mouth 3 (three) times daily. 10/19/13   Carlus Pavlov, MD  glucagon (GLUCAGON EMERGENCY) 1 MG injection Inject in the muscle as needed for hypoglycemia 12/15/12   Carlus Pavlov, MD  insulin lispro (HUMALOG) 100 UNIT/ML injection  Use in insulin pump, approx 100 units a day. 12/11/13   Carlus Pavlov, MD  Lidocaine HCl 4 % SOLN Use as needed with pump insertions   Patient needs a prescription hand written. 02/12/13   Carlus Pavlov, MD  metFORMIN (GLUCOPHAGE) 500 MG tablet Take 1,000 mg by mouth 2 (two) times daily with a meal.     Historical Provider, MD  nitroGLYCERIN (NITROSTAT) 0.4 MG SL tablet Place 1 tablet (0.4 mg total) under the tongue every 5 (five) minutes as needed for chest pain. Go to ER if 2nd dose needed 11/24/12 02/01/14  Salley Scarlet, MD  simvastatin (ZOCOR) 10 MG tablet Take 1 tablet (10 mg total) by mouth at bedtime. For cholesterol 07/21/13   Salley Scarlet, MD   Physical Exam: Filed Vitals:   02/19/14 1857  BP: 131/70  Pulse: 98  Temp: 98.2 F (36.8 C)  Resp: 20     General:  alert  Eyes: eom-i  ENT: no oral ulcers   Neck: supple, no JVD  Cardiovascular: s1,s2 rrr  Respiratory: CTA BL  Abdomen: soft, nt,nd   Skin: R foot erythema, edema, tender, ulcer   Musculoskeletal: no tender joints   Psychiatric: no hallucination   Neurologic: CN 2-12 intact, motor 5/5  Labs on Admission:  Basic Metabolic Panel:  Recent Labs Lab 02/19/14 1950  NA 137  K 3.8  CL 99  CO2 24  GLUCOSE 326*  BUN 10  CREATININE 0.57  CALCIUM 8.9   Liver Function Tests:  Recent Labs Lab 02/19/14 1950  AST 15  ALT 17  ALKPHOS 134*  BILITOT 0.5  PROT 7.4  ALBUMIN 3.4*   No results found for this basename: LIPASE, AMYLASE,  in the last 168 hours No results found for this basename: AMMONIA,  in the last 168 hours CBC:  Recent Labs Lab 02/19/14 1950  WBC 11.4*  NEUTROABS 8.0*  HGB 13.3  HCT 38.5  MCV 84.2  PLT 302   Cardiac Enzymes: No results found for this basename: CKTOTAL, CKMB, CKMBINDEX, TROPONINI,  in the last 168 hours  BNP (last 3 results) No results found for this basename: PROBNP,  in the last 8760 hours CBG:  Recent Labs Lab 02/19/14 1918  GLUCAP 280*     Radiological Exams on Admission: Dg Foot Complete Right  02/19/2014   CLINICAL DATA:  Right foot infection/cellulitis  EXAM: RIGHT FOOT COMPLETE - 3+ VIEW  COMPARISON:  October 31, 2013  FINDINGS: Frontal, oblique, and lateral views were obtained. The patient has had a previous bunionectomy with bony remodeling in the distal first metatarsal. There is no acute fracture or dislocation. Joint spaces appear intact. There is no erosive change or bony destruction. There is a small inferior calcaneal spur. There is soft tissue swelling throughout the dorsum of the foot.  IMPRESSION: Dorsal soft tissue swelling. Small inferior calcaneal spur. Status post bunionectomy.  No demonstrable erosive change or bony  destruction. No fracture or dislocation.   Electronically Signed   By: Bretta Bang M.D.   On: 02/19/2014 21:34    EKG: Independently reviewed.   Assessment/Plan Principal Problem:   Cellulitis Active Problems:   HYPERLIPIDEMIA   OBESITY   Hypertension  48 y.o. female with PMH of IDDM, HTN, HPL presented with worsening redness and swelling to the dorsum of her right foot found to have cellulitis failed outpatient PO antibiotics   1. Right foot cellulitis, DM foot ulcer; x ray: No demonstrable erosive change or bony destruction -started on IV atx, cont clinical monitoring   2. IDDM; on insulin pump at home; and metformin  -hold insulin pump; cont metformin; lantus 10+ ISS; titrate as per response; check a1c  3. HTN not on meds; cont monitoring    None;  if consultant consulted, please document name and whether formally or informally consulted  Code Status: full (must indicate code status--if unknown or must be presumed, indicate so) Family Communication: d/w patient, her father (indicate person spoken with, if applicable, with phone number if by telephone) Disposition Plan: home 24-48 hours  (indicate anticipated LOS)  Time spent: >35 minutes   Esperanza Sheets [facility] Pager [phone]  If 7PM-7AM, please contact night-coverage [email or web address] Password [facility] 02/19/2014, 10:02 PM

## 2014-02-19 NOTE — ED Notes (Signed)
Patient began itching on back of head since start of Vanc.  Dr. Jeraldine Loots notified.  Vanc stopped, IVF running.

## 2014-02-20 ENCOUNTER — Observation Stay (HOSPITAL_COMMUNITY): Payer: Managed Care, Other (non HMO)

## 2014-02-20 LAB — GLUCOSE, CAPILLARY
GLUCOSE-CAPILLARY: 177 mg/dL — AB (ref 70–99)
GLUCOSE-CAPILLARY: 256 mg/dL — AB (ref 70–99)
Glucose-Capillary: 212 mg/dL — ABNORMAL HIGH (ref 70–99)
Glucose-Capillary: 245 mg/dL — ABNORMAL HIGH (ref 70–99)
Glucose-Capillary: 271 mg/dL — ABNORMAL HIGH (ref 70–99)

## 2014-02-20 LAB — HEMOGLOBIN A1C
Hgb A1c MFr Bld: 9 % — ABNORMAL HIGH (ref <5.7)
MEAN PLASMA GLUCOSE: 212 mg/dL — AB (ref <117)

## 2014-02-20 MED ORDER — ACETAMINOPHEN 650 MG RE SUPP: 650.0000 mg | Freq: Four times a day (QID) | RECTAL | Status: DC | PRN

## 2014-02-20 MED ORDER — GABAPENTIN 100 MG PO CAPS
100.0000 mg | ORAL_CAPSULE | ORAL | Status: DC
  Administered 2014-02-20 – 2014-02-22 (×4): 100 mg via ORAL
  Filled 2014-02-20 (×4): qty 1

## 2014-02-20 MED ORDER — GABAPENTIN 100 MG PO CAPS
100.0000 mg | ORAL_CAPSULE | Freq: Three times a day (TID) | ORAL | Status: DC
  Administered 2014-02-20: 100 mg via ORAL
  Filled 2014-02-20: qty 1

## 2014-02-20 MED ORDER — LINEZOLID 2 MG/ML IV SOLN
600.0000 mg | Freq: Two times a day (BID) | INTRAVENOUS | Status: DC
  Administered 2014-02-20 – 2014-02-22 (×5): 600 mg via INTRAVENOUS
  Filled 2014-02-20 (×7): qty 300

## 2014-02-20 MED ORDER — ASPIRIN EC 81 MG PO TBEC
81.0000 mg | DELAYED_RELEASE_TABLET | Freq: Every day | ORAL | Status: DC
  Administered 2014-02-20 – 2014-02-22 (×3): 81 mg via ORAL
  Filled 2014-02-20 (×3): qty 1

## 2014-02-20 MED ORDER — PNEUMOCOCCAL VAC POLYVALENT 25 MCG/0.5ML IJ INJ
0.5000 mL | INJECTION | INTRAMUSCULAR | Status: AC
  Administered 2014-02-21: 0.5 mL via INTRAMUSCULAR
  Filled 2014-02-20: qty 0.5

## 2014-02-20 MED ORDER — OXYCODONE-ACETAMINOPHEN 5-325 MG PO TABS
1.0000 | ORAL_TABLET | ORAL | Status: DC | PRN
  Administered 2014-02-20 – 2014-02-22 (×4): 1 via ORAL
  Filled 2014-02-20 (×4): qty 1

## 2014-02-20 MED ORDER — VITAMIN C 500 MG PO TABS
250.0000 mg | ORAL_TABLET | Freq: Every day | ORAL | Status: DC
  Administered 2014-02-20 – 2014-02-22 (×3): 250 mg via ORAL
  Filled 2014-02-20 (×3): qty 1

## 2014-02-20 MED ORDER — ONDANSETRON HCL 4 MG/2ML IJ SOLN: 4.0000 mg | Freq: Four times a day (QID) | INTRAMUSCULAR | Status: DC | PRN

## 2014-02-20 MED ORDER — GABAPENTIN 100 MG PO CAPS: 100.0000 mg | ORAL_CAPSULE | Freq: Three times a day (TID) | ORAL | Status: DC

## 2014-02-20 MED ORDER — LINEZOLID 2 MG/ML IV SOLN
INTRAVENOUS | Status: AC
  Filled 2014-02-20: qty 300

## 2014-02-20 MED ORDER — ONDANSETRON HCL 4 MG PO TABS: 4.0000 mg | ORAL_TABLET | Freq: Four times a day (QID) | ORAL | Status: DC | PRN

## 2014-02-20 MED ORDER — INSULIN PUMP: SUBCUTANEOUS | Status: DC

## 2014-02-20 MED ORDER — METFORMIN HCL 500 MG PO TABS
1000.0000 mg | ORAL_TABLET | Freq: Two times a day (BID) | ORAL | Status: DC
  Administered 2014-02-20: 1000 mg via ORAL
  Filled 2014-02-20: qty 2

## 2014-02-20 MED ORDER — ENOXAPARIN SODIUM 40 MG/0.4ML ~~LOC~~ SOLN
40.0000 mg | SUBCUTANEOUS | Status: DC
  Administered 2014-02-20 – 2014-02-22 (×3): 40 mg via SUBCUTANEOUS
  Filled 2014-02-20 (×3): qty 0.4

## 2014-02-20 MED ORDER — INSULIN ASPART 100 UNIT/ML ~~LOC~~ SOLN
0.0000 [IU] | Freq: Three times a day (TID) | SUBCUTANEOUS | Status: DC
  Administered 2014-02-20: 3 [IU] via SUBCUTANEOUS
  Administered 2014-02-20: 2 [IU] via SUBCUTANEOUS
  Administered 2014-02-20: 3 [IU] via SUBCUTANEOUS
  Administered 2014-02-21: 2 [IU] via SUBCUTANEOUS
  Administered 2014-02-21: 3 [IU] via SUBCUTANEOUS

## 2014-02-20 MED ORDER — GABAPENTIN 100 MG PO CAPS
200.0000 mg | ORAL_CAPSULE | Freq: Every day | ORAL | Status: DC
  Administered 2014-02-20 – 2014-02-21 (×2): 200 mg via ORAL
  Filled 2014-02-20 (×2): qty 2

## 2014-02-20 MED ORDER — SODIUM CHLORIDE 0.9 % IV SOLN
INTRAVENOUS | Status: AC
  Administered 2014-02-20: via INTRAVENOUS

## 2014-02-20 MED ORDER — INSULIN GLARGINE 100 UNIT/ML ~~LOC~~ SOLN
10.0000 [IU] | Freq: Every day | SUBCUTANEOUS | Status: DC
  Administered 2014-02-20 – 2014-02-21 (×2): 10 [IU] via SUBCUTANEOUS
  Filled 2014-02-20 (×3): qty 0.1

## 2014-02-20 MED ORDER — LISINOPRIL 5 MG PO TABS
5.0000 mg | ORAL_TABLET | Freq: Every day | ORAL | Status: DC
  Administered 2014-02-20 – 2014-02-22 (×3): 5 mg via ORAL
  Filled 2014-02-20 (×3): qty 1

## 2014-02-20 MED ORDER — SIMVASTATIN 10 MG PO TABS
10.0000 mg | ORAL_TABLET | Freq: Every day | ORAL | Status: DC
  Administered 2014-02-20 – 2014-02-21 (×3): 10 mg via ORAL
  Filled 2014-02-20 (×3): qty 1

## 2014-02-20 MED ORDER — IOHEXOL 300 MG/ML  SOLN
100.0000 mL | Freq: Once | INTRAMUSCULAR | Status: AC | PRN
  Administered 2014-02-20: 100 mL via INTRAVENOUS

## 2014-02-20 MED ORDER — ACETAMINOPHEN 325 MG PO TABS: 650.0000 mg | ORAL_TABLET | Freq: Four times a day (QID) | ORAL | Status: DC | PRN

## 2014-02-20 MED ORDER — INSULIN GLARGINE 100 UNIT/ML ~~LOC~~ SOLN
10.0000 [IU] | Freq: Every day | SUBCUTANEOUS | Status: DC
  Administered 2014-02-20: 10 [IU] via SUBCUTANEOUS
  Filled 2014-02-20 (×3): qty 0.1

## 2014-02-20 MED ORDER — METFORMIN HCL 500 MG PO TABS: 1000.0000 mg | ORAL_TABLET | Freq: Two times a day (BID) | ORAL | Status: DC

## 2014-02-20 MED ORDER — TRIAMCINOLONE ACETONIDE 0.1 % EX CREA
1.0000 "application " | TOPICAL_CREAM | Freq: Two times a day (BID) | CUTANEOUS | Status: DC
  Administered 2014-02-20 – 2014-02-22 (×4): 1 via TOPICAL
  Filled 2014-02-20: qty 15

## 2014-02-20 NOTE — Progress Notes (Signed)
ANTIBIOTIC CONSULT NOTE-Preliminary  Pharmacy Consult for Zyvox Indication: Cellulitis  Allergies  Allergen Reactions  . Ciprofloxacin Hives and Swelling  . Penicillins Anaphylaxis and Rash  . Codeine Other (See Comments)    REACTION:  had hallicunations  . Morphine Nausea And Vomiting and Other (See Comments)    REACTION:   recieved in ED due to [facility] and had multple doses - gave visual hallucinations and vomitting.  . Sulfonamide Derivatives Other (See Comments)    REACTION: Unsure - childhood allergy  . Vancomycin Itching    Patient Measurements: Height: 4\' 9"  (144.8 cm) Weight: 192 lb (87.091 kg) IBW/kg (Calculated) : 38.6   Vital Signs: Temp: 98.5 F (36.9 C) (03/13 2347) Temp src: Oral (03/13 2347) BP: 148/83 mmHg (03/13 2347) Pulse Rate: 86 (03/13 2347)  Labs:  Recent Labs  02/19/14 1950  WBC 11.4*  HGB 13.3  PLT 302  CREATININE 0.57    Estimated Creatinine Clearance: 79.6 ml/min (by C-G formula based on Cr of 0.57).  No results found for this basename: VANCOTROUGH, VANCOPEAK, VANCORANDOM, GENTTROUGH, GENTPEAK, GENTRANDOM, TOBRATROUGH, TOBRAPEAK, TOBRARND, AMIKACINPEAK, AMIKACINTROU, AMIKACIN,  in the last 72 hours   Microbiology: No results found for this or any previous visit (from the past 720 hour(s)).  Medical History: Past Medical History  Diagnosis Date  . Diabetes mellitus     insulin pump 2013  . Kidney stone     Medications:  Vancomycin infusion started in the ED but discontinued due to itching.  Assessment: 48 yo IDDM female with Rt foot cellulitis, fever, chills and elevated WBCs; failed OP antibiotics. Zyvox to be started due to allergic rxn to Vancomycin.  Goal of Therapy:  Eradication of infection  Plan: (s Preliminary review of pertinent patient information completed.  Protocol will be initiated with a one-time dose of Zyvox 600 mg IV.  57 clinical pharmacist will complete review during morning rounds to assess  patient and finalize treatment regimen.  Jeani Hawking, [facility] 02/20/2014,4:13 AM .

## 2014-02-20 NOTE — Progress Notes (Signed)
ANTIBIOTIC CONSULT NOTE - INITIAL  Pharmacy Consult for Zyvox Indication: cellulitis  Allergies  Allergen Reactions  . Ciprofloxacin Hives and Swelling  . Penicillins Anaphylaxis and Rash  . Codeine Other (See Comments)    REACTION:  had hallicunations  . Morphine Nausea And Vomiting and Other (See Comments)    REACTION:   recieved in ED due to [facility] and had multple doses - gave visual hallucinations and vomitting.  . Sulfonamide Derivatives Other (See Comments)    REACTION: Unsure - childhood allergy  . Vancomycin Itching    Patient Measurements: Height: 4\' 9"  (144.8 cm) Weight: 192 lb (87.091 kg) IBW/kg (Calculated) : 38.6  Vital Signs: Temp: 98.7 F (37.1 C) (03/14 0500) Temp src: Oral (03/14 0500) BP: 121/62 mmHg (03/14 0500) Pulse Rate: 81 (03/14 0500) Intake/Output from previous day:   Intake/Output from this shift:    Labs:  Recent Labs  02/19/14 1950  WBC 11.4*  HGB 13.3  PLT 302  CREATININE 0.57   Estimated Creatinine Clearance: 79.6 ml/min (by C-G formula based on Cr of 0.57). No results found for this basename: VANCOTROUGH, VANCOPEAK, VANCORANDOM, GENTTROUGH, GENTPEAK, GENTRANDOM, TOBRATROUGH, TOBRAPEAK, TOBRARND, AMIKACINPEAK, AMIKACINTROU, AMIKACIN,  in the last 72 hours   Microbiology: No results found for this or any previous visit (from the past 720 hour(s)).  Medical History: Past Medical History  Diagnosis Date  . Diabetes mellitus     insulin pump 2013  . Kidney stone     Medications:  Prescriptions prior to admission  Medication Sig Dispense Refill  . ascorbic acid (VITAMIN C) 250 MG CHEW Chew 250 mg by mouth daily.       . diphenhydrAMINE (BENADRYL) 25 MG tablet Take 1 tablet (25 mg total) by mouth every 6 (six) hours.  20 tablet  0  . gabapentin (NEURONTIN) 100 MG capsule Take 100-200 mg by mouth 3 (three) times daily. Takes one capsule in the morning and one capsule in the afternoon, then takes two capsules at bedtime       . glucagon (GLUCAGON EMERGENCY) 1 MG injection Inject in the muscle as needed for hypoglycemia  1 each  2  . insulin lispro (HUMALOG) 100 UNIT/ML injection Use in insulin pump, approx 100 units a day.  30 mL  3  . Lidocaine HCl 4 % SOLN Use as needed with pump insertions   Patient needs a prescription hand written.  1 vial  3  . LISINOPRIL PO Take 1 tablet by mouth daily. For KIDNEY PROTECTION      . metFORMIN (GLUCOPHAGE) 500 MG tablet Take 1,000 mg by mouth 2 (two) times daily with a meal.       . nitroGLYCERIN (NITROSTAT) 0.4 MG SL tablet Place 1 tablet (0.4 mg total) under the tongue every 5 (five) minutes as needed for chest pain. Go to ER if 2nd dose needed  20 tablet  1  . simvastatin (ZOCOR) 10 MG tablet Take 1 tablet (10 mg total) by mouth at bedtime. For cholesterol  30 tablet  3  . triamcinolone cream (KENALOG) 0.1 % Apply 1 application topically 2 (two) times daily.       Scheduled:  . sodium chloride   Intravenous STAT  . aspirin EC  81 mg Oral Daily  . diphenhydrAMINE  25 mg Oral Q6H  . enoxaparin (LOVENOX) injection  40 mg Subcutaneous Q24H  . gabapentin  100 mg Oral TID  . insulin aspart  0-9 Units Subcutaneous TID WC  . insulin glargine  10  Units Subcutaneous QHS  . linezolid  600 mg Intravenous Q12H  . metFORMIN  1,000 mg Oral BID WC  . [START ON 02/21/2014] pneumococcal 23 valent vaccine  0.5 mL Intramuscular Tomorrow-1000  . simvastatin  10 mg Oral QHS  . vitamin C  250 mg Oral Daily   Assessment: Okay for Protocol, patient with multiple medication allergies.  Apparently failed outpatient cephalosporin treatment.  New reaction to Vancomycin this admission.  Zyvox 3/14 >>  Goal of Therapy:  Eradicate infection.  Plan:  Zyvox 600mg  IV every 12 hours. Monitor renal function, cultures and vitals.  02/20/2014,8:00 AM

## 2014-02-20 NOTE — Progress Notes (Signed)
[facility] PROGRESS NOTE  [name] GPQ:[id number] DOB: 18-Sep-1966 DOA: 02/25/2014 PCP: [name] Pizza, MD  Assessment/Plan: Right foot cellulitis Failed outpt abx. Continue IV linezolid. Allergic to vancomycin and PCN. Swelling over rt dorsum with small pus discharge.  Will obtain CT of the foot to r/o underlying abscess. Bedside dressing. Pain control with Vicodin when necessary   Superficial Diabetic foot ulcer Continue bedside dressing.   IDDM  on insulin pump and  Metformin. Will hold metformin as she is getting IV contrast . Will resume home insulin pump.   Hypertension Stable  DVT prophylaxis   Code Status: Full code Family Communication: Parents at bedside Disposition Plan: Home once he improved   Consultants:  none  Procedures: none Antibiotics:  IV linezolid  HPI/Subjective: Patient seen and examined this morning. Reports some pain over rt foot  Objective: Filed Vitals:   02/20/14 0500  BP: 121/62  Pulse: 81  Temp: 98.7 F (37.1 C)  Resp: 20    Intake/Output Summary (Last 24 hours) at 02/20/14 1152 Last data filed at 02/20/14 1100  Gross per 24 hour  Intake  537.5 ml  Output      0 ml  Net  537.5 ml   Filed Weights   02-25-2014 1857  Weight: 87.091 kg (192 lb)    Exam:   General:  Middle aged obese female in NAD  HEENT: no pallor, moist oral mucosa  Cardiovascular: NS1&S2, no murmurs  Respiratory: clear breath sounds b/l, no added sounds  Abdomen: soft, NT, ND, BS+  Musculoskeletal: warm, superficial ulceration and scab over right foot, small area of swelling over dorsum with scanty pus expressed upon pressure. Scabs over left foot  CNS: AAOX3  Data Reviewed: Basic Metabolic Panel:  Recent Labs Lab 02/25/2014 1950  NA 137  K 3.8  CL 99  CO2 24  GLUCOSE 326*  BUN 10  CREATININE 0.57  CALCIUM 8.9   Liver Function Tests:  Recent Labs Lab 02/25/14 1950  AST 15  ALT 17  ALKPHOS 134*  BILITOT 0.5  PROT 7.4   ALBUMIN 3.4*   No results found for this basename: LIPASE, AMYLASE,  in the last 168 hours No results found for this basename: AMMONIA,  in the last 168 hours CBC:  Recent Labs Lab 02-25-14 1950  WBC 11.4*  NEUTROABS 8.0*  HGB 13.3  HCT 38.5  MCV 84.2  PLT 302   Cardiac Enzymes: No results found for this basename: CKTOTAL, CKMB, CKMBINDEX, TROPONINI,  in the last 168 hours BNP (last 3 results) No results found for this basename: PROBNP,  in the last 8760 hours CBG:  Recent Labs Lab 02/25/2014 1918 02-25-14 2349 02/20/14 0805 02/20/14 1116  GLUCAP 280* 271* 245* 212*    No results found for this or any previous visit (from the past 240 hour(s)).   Studies: Dg Foot Complete Right  02/25/2014   CLINICAL DATA:  Right foot infection/cellulitis  EXAM: RIGHT FOOT COMPLETE - 3+ VIEW  COMPARISON:  October 31, 2013  FINDINGS: Frontal, oblique, and lateral views were obtained. The patient has had a previous bunionectomy with bony remodeling in the distal first metatarsal. There is no acute fracture or dislocation. Joint spaces appear intact. There is no erosive change or bony destruction. There is a small inferior calcaneal spur. There is soft tissue swelling throughout the dorsum of the foot.  IMPRESSION: Dorsal soft tissue swelling. Small inferior calcaneal spur. Status post bunionectomy.  No demonstrable erosive change or bony destruction. No fracture  or dislocation.   Electronically Signed   By: [name] M.D.   On: 02/19/2014 21:34    Scheduled Meds: . sodium chloride   Intravenous STAT  . aspirin EC  81 mg Oral Daily  . diphenhydrAMINE  25 mg Oral Q6H  . enoxaparin (LOVENOX) injection  40 mg Subcutaneous Q24H  . gabapentin  100 mg Oral TID  . insulin aspart  0-9 Units Subcutaneous TID WC  . insulin glargine  10 Units Subcutaneous QHS  . linezolid  600 mg Intravenous Q12H  . metFORMIN  1,000 mg Oral BID WC  . [START ON 02/21/2014] pneumococcal 23 valent vaccine  0.5  mL Intramuscular Tomorrow-1000  . simvastatin  10 mg Oral QHS  . vitamin C  250 mg Oral Daily   Continuous Infusions:     Time spent: 25 minutes    [name]  [facility] Pager [phone] If 7PM-7AM, please contact night-coverage at [email or web address], password [facility] 02/20/2014, 11:52 AM  LOS: 1 day

## 2014-02-20 NOTE — Progress Notes (Signed)
UR completed 

## 2014-02-20 NOTE — Progress Notes (Signed)
Spoke with patient about restarting her insulin pump while in hospital per MD order.  Pt requests to remain on SSI for now because she does not have her supplies here and she would not be able to get them to hospital until tomorrow afternoon, and pt is hoping to go home.  MD made aware.

## 2014-02-21 LAB — GLUCOSE, CAPILLARY
GLUCOSE-CAPILLARY: 200 mg/dL — AB (ref 70–99)
GLUCOSE-CAPILLARY: 155 mg/dL — AB (ref 70–99)
Glucose-Capillary: 202 mg/dL — ABNORMAL HIGH (ref 70–99)
Glucose-Capillary: 201 mg/dL — ABNORMAL HIGH (ref 70–99)

## 2014-02-21 MED ORDER — INSULIN ASPART 100 UNIT/ML ~~LOC~~ SOLN
0.0000 [IU] | Freq: Three times a day (TID) | SUBCUTANEOUS | Status: DC
  Administered 2014-02-21: 3 [IU] via SUBCUTANEOUS
  Administered 2014-02-22: 5 [IU] via SUBCUTANEOUS

## 2014-02-21 NOTE — Progress Notes (Signed)
[facility] PROGRESS NOTE  [name] PFX:[id number] DOB: 1966-01-21 DOA: 02/19/2014 PCP: Catalina Pizza, MD  Assessment/Plan: Principal Problem:  Cellulitis  Active Problems:  HYPERLIPIDEMIA  OBESITY  Hypertension   48 y.o. female with PMH of IDDM, HTN, HPL presented with worsening redness and swelling to the dorsum of her right foot found to have cellulitis failed outpatient PO antibiotics  1. Right foot cellulitis, DM foot ulcer; x ray: No demonstrable erosive change or bony destruction  -cont IV atx(multiple allegies), appreciate surgical input; cont clinical monitoring  2. IDDM; on insulin pump at home; and metformin  -hold insulin pump; cont metformin; lantus 10+ ISS; titrate as per response; check a1c  3. HTN not on meds; cont monitoring    Code Status: full Family Communication: d/w patient (indicate person spoken with, relationship, and if by phone, the number) Disposition Plan: home 24-48 hours    Consultants:  surgery  Procedures:  none  Antibiotics:  linezolid  (indicate start date, and stop date if known)  HPI/Subjective: alert  Objective: Filed Vitals:   02/21/14 0501  BP: 118/68  Pulse: 77  Temp: 98 F (36.7 C)  Resp: 20    Intake/Output Summary (Last 24 hours) at 02/21/14 1215 Last data filed at 02/21/14 0800  Gross per 24 hour  Intake    480 ml  Output      0 ml  Net    480 ml   Filed Weights   02/19/14 1857  Weight: 87.091 kg (192 lb)    Exam:   General:  alert  Cardiovascular: s1,s2 rrr  Respiratory: CTA bl  Abdomen: soft,nt,nd  Musculoskeletal: fott cellulitis improving    Data Reviewed: Basic Metabolic Panel:  Recent Labs Lab 02/19/14 1950  NA 137  K 3.8  CL 99  CO2 24  GLUCOSE 326*  BUN 10  CREATININE 0.57  CALCIUM 8.9   Liver Function Tests:  Recent Labs Lab 02/19/14 1950  AST 15  ALT 17  ALKPHOS 134*  BILITOT 0.5  PROT 7.4  ALBUMIN 3.4*   No results found for this basename: LIPASE,  AMYLASE,  in the last 168 hours No results found for this basename: AMMONIA,  in the last 168 hours CBC:  Recent Labs Lab 02/19/14 1950  WBC 11.4*  NEUTROABS 8.0*  HGB 13.3  HCT 38.5  MCV 84.2  PLT 302   Cardiac Enzymes: No results found for this basename: CKTOTAL, CKMB, CKMBINDEX, TROPONINI,  in the last 168 hours BNP (last 3 results) No results found for this basename: PROBNP,  in the last 8760 hours CBG:  Recent Labs Lab 02/20/14 1116 02/20/14 1623 02/20/14 2207 02/21/14 0723 02/21/14 1131  GLUCAP 212* 177* 256* 200* 202*    No results found for this or any previous visit (from the past 240 hour(s)).   Studies: Ct Foot Right W Contrast  02/20/2014   CLINICAL DATA:  Cellulitis of the foot.  EXAM: CT OF THE RIGHT FOOT WITH CONTRAST  TECHNIQUE: Multidetector CT imaging was performed following the standard protocol during bolus administration of intravenous contrast.  CONTRAST:  OMNIPAQUE IOHEXOL 300 MG/ML  SOLN  COMPARISON:  Radiographs dated 02/19/2014 and 10/31/2013  FINDINGS: There is a focal 5 mm abscess in the subcutaneous fat of the dorsum of the foot overlying the dorsal aspect of the of the joint between the medial and middle cuneiform. The abscess is immediately under the skin surface.  There is diffuse edema in the subcutaneous fat of the dorsum of  the foot consistent with cellulitis.  No osteomyelitis. No joint effusions. The visualized tendons are normal.  IMPRESSION: 5 mm abscess in the dorsal soft tissues of the foot overlying the middle and medial cuneiforms.   Electronically Signed   By: Geanie Cooley M.D.   On: 02/20/2014 13:01   Dg Foot Complete Right  02/19/2014   CLINICAL DATA:  Right foot infection/cellulitis  EXAM: RIGHT FOOT COMPLETE - 3+ VIEW  COMPARISON:  October 31, 2013  FINDINGS: Frontal, oblique, and lateral views were obtained. The patient has had a previous bunionectomy with bony remodeling in the distal first metatarsal. There is no acute  fracture or dislocation. Joint spaces appear intact. There is no erosive change or bony destruction. There is a small inferior calcaneal spur. There is soft tissue swelling throughout the dorsum of the foot.  IMPRESSION: Dorsal soft tissue swelling. Small inferior calcaneal spur. Status post bunionectomy.  No demonstrable erosive change or bony destruction. No fracture or dislocation.   Electronically Signed   By: Bretta Bang M.D.   On: 02/19/2014 21:34    Scheduled Meds: . aspirin EC  81 mg Oral Daily  . diphenhydrAMINE  25 mg Oral Q6H  . enoxaparin (LOVENOX) injection  40 mg Subcutaneous Q24H  . gabapentin  100 mg Oral 2 times per day   And  . gabapentin  200 mg Oral QHS  . insulin aspart  0-9 Units Subcutaneous TID WC  . insulin glargine  10 Units Subcutaneous QHS  . linezolid  600 mg Intravenous Q12H  . lisinopril  5 mg Oral Daily  . simvastatin  10 mg Oral QHS  . triamcinolone cream  1 application Topical BID  . vitamin C  250 mg Oral Daily   Continuous Infusions:   Principal Problem:   Cellulitis Active Problems:   HYPERLIPIDEMIA   OBESITY   Hypertension    Time spent: >35 minutes     Esperanza Sheets  [facility] Pager [phone]. If 7PM-7AM, please contact night-coverage at [email or web address], password [facility] 02/21/2014, 12:15 PM  LOS: 2 days

## 2014-02-21 NOTE — Consult Note (Signed)
Reason for Consult: Cellulitis, right foot Referring Physician: [facility]  [name] is an 48 y.o. female.  HPI: Patient is a 49 year old white female who presents with worsening redness and swelling in the right foot. She is an insulin-dependent diabetic who states she gets scabs on her feet. 1 developed along the dorsum of her right foot and she was told to keep her feet clean with soap and water. She presented to the hospital 2 days ago with worsening redness and swelling of the right foot. She was started on IV antibiotics. An initial attempt was made to express some pus from the pustule on the dorsum of the right foot.  I have been consulted to see whether an I&D is indicated. Patient states she still has sensation in the right foot.  Past Medical History  Diagnosis Date  . Diabetes mellitus     insulin pump 2013  . Kidney stone     Past Surgical History  Procedure Laterality Date  . Cesarean section  1994;2000    [place]  . Cholecystectomy  1992  . Sinus [place]  . Foot surgery  March, 2013    [facility]  . Hysteroscopy with thermachoice  04/25/2012    Procedure: HYSTEROSCOPY WITH THERMACHOICE;  Surgeon: Florian Buff, MD;  Location: [facility];  Service: Gynecology;  Laterality: N/A;  total therapy time= 11 minutes 42 seconds; 34 ml D5w  in and 34 ml D5w out; temp =87 degrees F  . Cystoscopy w/ ureteral stent placement  05/08/2012    Procedure: CYSTOSCOPY WITH RETROGRADE PYELOGRAM/URETERAL STENT PLACEMENT;  Surgeon: Marissa Nestle, MD;  Location: [facility];  Service: Urology;  Laterality: Right;    Family History  Problem Relation Age of Onset  . Heart disease    . Cancer    . Diabetes    . Achalasia    . Anesthesia problems Neg Hx   . Hypotension Neg Hx   . Malignant hyperthermia Neg Hx   . Pseudochol deficiency Neg Hx   . Diabetes Mother   . Depression Paternal Grandmother   . Depression Paternal Grandfather     Social History:   reports that she has never smoked. She has never used smokeless tobacco. She reports that she does not drink alcohol or use illicit drugs.  Allergies:  Allergies  Allergen Reactions  . Ciprofloxacin Hives and Swelling  . Penicillins Anaphylaxis and Rash  . Codeine Other (See Comments)    REACTION:  had hallicunations  . Morphine Nausea And Vomiting and Other (See Comments)    REACTION:   recieved in ED due to [facility] and had multple doses - gave visual hallucinations and vomitting.  . Sulfonamide Derivatives Other (See Comments)    REACTION: Unsure - childhood allergy  . Vancomycin Itching    Medications: I have reviewed the patient's current medications.  Results for orders placed during the hospital encounter of 02/19/14 (from the past 48 hour(s))  CBG MONITORING, ED     Status: Abnormal   Collection Time    02/19/14  7:18 PM      Result Value Ref Range   Glucose-Capillary 280 (*) 70 - 99 mg/dL  HEMOGLOBIN A1C     Status: Abnormal   Collection Time    02/19/14  7:43 PM      Result Value Ref Range   Hemoglobin A1C 9.0 (*) <5.7 %   Comment: (NOTE)  According to the ADA Clinical Practice Recommendations for 2011, when     HbA1c is used as a screening test:      >=6.5%   Diagnostic of Diabetes Mellitus               (if abnormal result is confirmed)     5.7-6.4%   Increased risk of developing Diabetes Mellitus     References:Diagnosis and Classification of Diabetes Mellitus,Diabetes     DEYC,1448,18(HUDJS 1):S62-S69 and Standards of Medical Care in             Diabetes - 2011,Diabetes HFWY,6378,58 (Suppl 1):S11-S61.   Mean Plasma Glucose 212 (*) <117 mg/dL   Comment: Performed at [facility]  CBC WITH DIFFERENTIAL     Status: Abnormal   Collection Time    02/19/14  7:50 PM      Result Value Ref Range   WBC 11.4 (*) 4.0 - 10.5 K/uL   RBC 4.57  3.87 - 5.11 MIL/uL   Hemoglobin 13.3   12.0 - 15.0 g/dL   HCT 38.5  36.0 - 46.0 %   MCV 84.2  78.0 - 100.0 fL   MCH 29.1  26.0 - 34.0 pg   MCHC 34.5  30.0 - 36.0 g/dL   RDW 13.4  11.5 - 15.5 %   Platelets 302  150 - 400 K/uL   Neutrophils Relative % 71  43 - 77 %   Neutro Abs 8.0 (*) 1.7 - 7.7 K/uL   Lymphocytes Relative 21  12 - 46 %   Lymphs Abs 2.3  0.7 - 4.0 K/uL   Monocytes Relative 8  3 - 12 %   Monocytes Absolute 0.9  0.1 - 1.0 K/uL   Eosinophils Relative 1  0 - 5 %   Eosinophils Absolute 0.1  0.0 - 0.7 K/uL   Basophils Relative 0  0 - 1 %   Basophils Absolute 0.0  0.0 - 0.1 K/uL  COMPREHENSIVE METABOLIC PANEL     Status: Abnormal   Collection Time    02/19/14  7:50 PM      Result Value Ref Range   Sodium 137  137 - 147 mEq/L   Potassium 3.8  3.7 - 5.3 mEq/L   Chloride 99  96 - 112 mEq/L   CO2 24  19 - 32 mEq/L   Glucose, Bld 326 (*) 70 - 99 mg/dL   BUN 10  6 - 23 mg/dL   Creatinine, Ser 0.57  0.50 - 1.10 mg/dL   Calcium 8.9  8.4 - 10.5 mg/dL   Total Protein 7.4  6.0 - 8.3 g/dL   Albumin 3.4 (*) 3.5 - 5.2 g/dL   AST 15  0 - 37 U/L   ALT 17  0 - 35 U/L   Alkaline Phosphatase 134 (*) 39 - 117 U/L   Total Bilirubin 0.5  0.3 - 1.2 mg/dL   GFR calc non Af Amer >90  >90 mL/min   GFR calc Af Amer >90  >90 mL/min   Comment: (NOTE)     The eGFR has been calculated using the CKD EPI equation.     This calculation has not been validated in all clinical situations.     eGFR's persistently <90 mL/min signify possible Chronic Kidney     Disease.  GLUCOSE, CAPILLARY     Status: Abnormal   Collection Time    02/19/14 11:49 PM      Result Value Ref Range   Glucose-Capillary 271 (*) 70 -  99 mg/dL   Comment 1 Notify RN    GLUCOSE, CAPILLARY     Status: Abnormal   Collection Time    02/20/14  8:05 AM      Result Value Ref Range   Glucose-Capillary 245 (*) 70 - 99 mg/dL   Comment 1 Documented in Chart     Comment 2 Notify RN    GLUCOSE, CAPILLARY     Status: Abnormal   Collection Time    02/20/14 11:16 AM       Result Value Ref Range   Glucose-Capillary 212 (*) 70 - 99 mg/dL   Comment 1 Documented in Chart     Comment 2 Notify RN    GLUCOSE, CAPILLARY     Status: Abnormal   Collection Time    02/20/14  4:23 PM      Result Value Ref Range   Glucose-Capillary 177 (*) 70 - 99 mg/dL   Comment 1 Documented in Chart     Comment 2 Notify RN    GLUCOSE, CAPILLARY     Status: Abnormal   Collection Time    02/20/14 10:07 PM      Result Value Ref Range   Glucose-Capillary 256 (*) 70 - 99 mg/dL   Comment 1 Notify RN    GLUCOSE, CAPILLARY     Status: Abnormal   Collection Time    02/21/14  7:23 AM      Result Value Ref Range   Glucose-Capillary 200 (*) 70 - 99 mg/dL   Comment 1 Documented in Chart     Comment 2 Notify RN    GLUCOSE, CAPILLARY     Status: Abnormal   Collection Time    02/21/14 11:31 AM      Result Value Ref Range   Glucose-Capillary 202 (*) 70 - 99 mg/dL   Comment 1 Documented in Chart     Comment 2 Notify RN      Ct Foot Right W Contrast  02/20/2014   CLINICAL DATA:  Cellulitis of the foot.  EXAM: CT OF THE RIGHT FOOT WITH CONTRAST  TECHNIQUE: Multidetector CT imaging was performed following the standard protocol during bolus administration of intravenous contrast.  CONTRAST:  188m OMNIPAQUE IOHEXOL 300 MG/ML  SOLN  COMPARISON:  Radiographs dated 02/19/2014 and 10/31/2013  FINDINGS: There is a focal 5 mm abscess in the subcutaneous fat of the dorsum of the foot overlying the dorsal aspect of the of the joint between the medial and middle cuneiform. The abscess is immediately under the skin surface.  There is diffuse edema in the subcutaneous fat of the dorsum of the foot consistent with cellulitis.  No osteomyelitis. No joint effusions. The visualized tendons are normal.  IMPRESSION: 5 mm abscess in the dorsal soft tissues of the foot overlying the middle and medial cuneiforms.   Electronically Signed   By: JRozetta NunneryM.D.   On: 02/20/2014 13:01   Dg Foot Complete  Right  02/19/2014   CLINICAL DATA:  Right foot infection/cellulitis  EXAM: RIGHT FOOT COMPLETE - 3+ VIEW  COMPARISON:  October 31, 2013  FINDINGS: Frontal, oblique, and lateral views were obtained. The patient has had a previous bunionectomy with bony remodeling in the distal first metatarsal. There is no acute fracture or dislocation. Joint spaces appear intact. There is no erosive change or bony destruction. There is a small inferior calcaneal spur. There is soft tissue swelling throughout the dorsum of the foot.  IMPRESSION: Dorsal soft tissue swelling. Small inferior calcaneal spur.  Status post bunionectomy.  No demonstrable erosive change or bony destruction. No fracture or dislocation.   Electronically Signed   By: Lowella Grip M.D.   On: 02/19/2014 21:34    ROS: See chart Blood pressure 118/68, pulse 77, temperature 98 F (36.7 C), temperature source Oral, resp. rate 20, height 4' 9" (1.448 m), weight 87.091 kg (192 lb), SpO2 98.00%. Physical Exam: Pleasant white female no acute distress. Right foot with minimal erythema noted around an small eschar over it a resolving pustule. No fluctuance is noted in this region. Apparent resolving cellulitis is noted around this region. A barely palpable dorsalis pedis pulses noted. Old scars are noted around the right foot, consistent with previous scabs.  Assessment/Plan: Impression: Likely cellulitis, insulin-dependent diabetes mellitus. No need for surgical incision and drainage at this time. We'll review in a.m.  Averyana Pillars A 02/21/2014, 12:17 PM

## 2014-02-22 ENCOUNTER — Inpatient Hospital Stay (HOSPITAL_COMMUNITY): Admission: RE | Admit: 2014-02-22 | Payer: Managed Care, Other (non HMO) | Source: Ambulatory Visit

## 2014-02-22 LAB — GLUCOSE, CAPILLARY: GLUCOSE-CAPILLARY: 205 mg/dL — AB (ref 70–99)

## 2014-02-22 MED ORDER — LINEZOLID 600 MG PO TABS: 600.0000 mg | ORAL_TABLET | Freq: Two times a day (BID) | ORAL | Status: DC

## 2014-02-22 NOTE — Care Management Note (Signed)
    Page 1 of 1   02/22/2014     11:14:15 AM   CARE MANAGEMENT NOTE 02/22/2014  Patient:  [name], [name]   Account Number:  [id number]  Date Initiated:  02/22/2014  Documentation initiated by:  Sharrie Rothman  Subjective/Objective Assessment:   Pt admitted from home with cellulitis. Pt lives with her husband and will return home at discharge. Pt independent with ADL's.     Action/Plan:   Pt discharged home today. No CM needs noted.   Anticipated DC Date:  02/22/2014   Anticipated DC Plan:  HOME/SELF CARE      DC Planning Services  CM consult      Choice offered to / List presented to:             Status of service:  Completed, signed off Medicare Important Message given?   (If response is "NO", the following Medicare IM given date fields will be blank) Date Medicare IM given:   Date Additional Medicare IM given:    Discharge Disposition:  HOME/SELF CARE  Per UR Regulation:    If discussed at Long Length of Stay Meetings, dates discussed:    Comments:  02/22/14 1115 Arlyss Queen, RN BSN CM

## 2014-02-22 NOTE — Progress Notes (Signed)
Discharge instructions and prescriptions given, verbalized understanding, out in stable condition with staff via w/c. 

## 2014-02-22 NOTE — Discharge Summary (Signed)
Physician Discharge Summary  BRITT THEARD ENM:[id number] DOB: 1966-03-18 DOA: 02/19/2014  PCP: Catalina Pizza, MD  Admit date: 02/19/2014 Discharge date: 02/22/2014  Time spent: >35 minutes  minutes  Recommendations for Outpatient Follow-up:  F/u with podiatry in 1 week F/u with primary care in 1-2 weeks  Discharge Diagnoses:  Principal Problem:   Cellulitis Active Problems:   HYPERLIPIDEMIA   OBESITY   Hypertension   Discharge Condition: stable   Diet recommendation: DM  Filed Weights   02/19/14 1857  Weight: 87.091 kg (192 lb)    History of present illness:  Principal Problem:  Cellulitis  Active Problems:  HYPERLIPIDEMIA  OBESITY  Hypertension    48 y.o. female with PMH of IDDM, HTN, HPL presented with worsening redness and swelling to the dorsum of her right foot found to have cellulitis failed outpatient PO antibiotics   Hospital Course:  1. Right foot cellulitis, DM foot ulcer; x ray: No demonstrable erosive change or bony destruction  -significantly improved on IV atx(multiple allegies), appreciate surgical input: no intervention at this time; -changed to PO atx, recommended to f/u with podiatry in 1 week  2. IDDM; on insulin pump at home; and metformin  -resume insulin regimen  3. HTN not on meds; cont monitoring         Procedures:  none (i.e. Studies not automatically included, echos, thoracentesis, etc; not x-rays)  Consultations:  surgery  Discharge Exam: Filed Vitals:   02/22/14 0446  BP: 110/58  Pulse: 78  Temp: 98.4 F (36.9 C)  Resp: 18    General: alert Cardiovascular: s1,s2 rrr Respiratory: CTA BL  Discharge Instructions  Discharge Orders   Future Orders Complete By Expires   Diet - low sodium heart healthy  As directed    Discharge instructions  As directed    Comments:     Please follow up with podiatry in 1 week Please follow up with primary care doctor in 1-2 weeks   Increase activity slowly  As directed         Medication List         ascorbic acid 250 MG Chew  Commonly known as:  VITAMIN C  Chew 250 mg by mouth daily.     diphenhydrAMINE 25 MG tablet  Commonly known as:  BENADRYL  Take 1 tablet (25 mg total) by mouth every 6 (six) hours.     gabapentin 100 MG capsule  Commonly known as:  NEURONTIN  Take 100-200 mg by mouth 3 (three) times daily. Takes one capsule in the morning and one capsule in the afternoon, then takes two capsules at bedtime     glucagon 1 MG injection  Commonly known as:  GLUCAGON EMERGENCY  Inject in the muscle as needed for hypoglycemia     insulin pump Soln  Inject into the skin continuous. insulin lispro (HUMALOG) 100 UNIT/ML injection     Lidocaine HCl 4 % Soln  Use as needed with pump insertions   Patient needs a prescription hand written.     linezolid 600 MG tablet  Commonly known as:  ZYVOX  Take 1 tablet (600 mg total) by mouth 2 (two) times daily.     lisinopril 5 MG tablet  Commonly known as:  PRINIVIL,ZESTRIL  Take 5 mg by mouth daily.     metFORMIN 500 MG tablet  Commonly known as:  GLUCOPHAGE  Take 1,000 mg by mouth 2 (two) times daily with a meal.     nitroGLYCERIN 0.4 MG SL tablet  Commonly known as:  NITROSTAT  Place 1 tablet (0.4 mg total) under the tongue every 5 (five) minutes as needed for chest pain. Go to ER if 2nd dose needed     simvastatin 10 MG tablet  Commonly known as:  ZOCOR  Take 1 tablet (10 mg total) by mouth at bedtime. For cholesterol     triamcinolone cream 0.1 %  Commonly known as:  KENALOG  Apply 1 application topically 2 (two) times daily.       Allergies  Allergen Reactions  . Ciprofloxacin Hives and Swelling  . Penicillins Anaphylaxis and Rash  . Codeine Other (See Comments)    REACTION:  had hallicunations  . Morphine Nausea And Vomiting and Other (See Comments)    REACTION:   recieved in ED due to [facility] and had multple doses - gave visual hallucinations and vomitting.  . Sulfonamide  Derivatives Other (See Comments)    REACTION: Unsure - childhood allergy  . Vancomycin Itching       Follow-up Information   Follow up with Catalina Pizza, MD. Schedule an appointment as soon as possible for a visit in 1 week.   Specialty:  Internal Medicine   Contact information:    [address]  [place] [address] [phone]        The results of significant diagnostics from this hospitalization (including imaging, microbiology, ancillary and laboratory) are listed below for reference.    Significant Diagnostic Studies: Ct Abdomen W Contrast  01/29/2014   CLINICAL DATA:  Umbilical region mass.  EXAM: CT ABDOMEN WITH CONTRAST  TECHNIQUE: Multidetector CT imaging of the abdomen was performed using the standard protocol following bolus administration of intravenous contrast.  CONTRAST:  OMNIPAQUE IOHEXOL 300 MG/ML  SOLN  COMPARISON:  06/23/2012 and multiple previous  FINDINGS: Lung bases are clear. No pleural or pericardial fluid. There is mild diffuse fatty change of the liver but no focal lesion. There has been previous cholecystectomy. The spleen is normal. The pancreas is normal. The adrenal glands are normal. The left kidney does not show any stone, mass or hydronephrosis. There are areas of focal atrophy. The right kidney contains a 9 mm stone in the lower pole, a 4 mm stone in the lower pole and a cluster of stones in the lower pole measuring in conglomeration 15 x 11 mm. There are areas of focal parenchymal atrophy. No hydronephrosis.  The aorta and IVC are unremarkable. No free intraperitoneal fluid or air. No primary bowel pathology.  The patient has had previous laparoscopy with a periumbilical approach, as evidenced by a surgical clip in the abdominal wall in that region. There is a periumbilical hernia with an opening size of 18 mm, containing some omental fat only. No bowel extension. Compared to the most recent previous examination, this is unchanged. Compared examinations  of several years ago, there is slightly more herniated fat.  There is degenerative disc disease at L4-5 without gross neural compression.  IMPRESSION: Periumbilical hernia containing omental fat. Hernia opening approximately 1.8 cm. No apparent change since the most recent previous exam. Slight increase in the amount of herniated fat when compared to more distant exams.   Electronically Signed   By: Paulina Fusi M.D.   On: 01/29/2014 08:49   Ct Foot Right W Contrast  02/20/2014   CLINICAL DATA:  Cellulitis of the foot.  EXAM: CT OF THE RIGHT FOOT WITH CONTRAST  TECHNIQUE: Multidetector CT imaging was performed following the standard protocol during bolus administration  of intravenous contrast.  CONTRAST:  OMNIPAQUE IOHEXOL 300 MG/ML  SOLN  COMPARISON:  Radiographs dated 02/19/2014 and 10/31/2013  FINDINGS: There is a focal 5 mm abscess in the subcutaneous fat of the dorsum of the foot overlying the dorsal aspect of the of the joint between the medial and middle cuneiform. The abscess is immediately under the skin surface.  There is diffuse edema in the subcutaneous fat of the dorsum of the foot consistent with cellulitis.  No osteomyelitis. No joint effusions. The visualized tendons are normal.  IMPRESSION: 5 mm abscess in the dorsal soft tissues of the foot overlying the middle and medial cuneiforms.   Electronically Signed   By: Geanie Cooley M.D.   On: 02/20/2014 13:01   Dg Foot Complete Right  02/19/2014   CLINICAL DATA:  Right foot infection/cellulitis  EXAM: RIGHT FOOT COMPLETE - 3+ VIEW  COMPARISON:  October 31, 2013  FINDINGS: Frontal, oblique, and lateral views were obtained. The patient has had a previous bunionectomy with bony remodeling in the distal first metatarsal. There is no acute fracture or dislocation. Joint spaces appear intact. There is no erosive change or bony destruction. There is a small inferior calcaneal spur. There is soft tissue swelling throughout the dorsum of the foot.   IMPRESSION: Dorsal soft tissue swelling. Small inferior calcaneal spur. Status post bunionectomy.  No demonstrable erosive change or bony destruction. No fracture or dislocation.   Electronically Signed   By: Bretta Bang M.D.   On: 02/19/2014 21:34   Mm Digital Diagnostic Unilat L  01/27/2014   CLINICAL DATA:  Screening recall for a possible left breast mass.  EXAM: DIGITAL DIAGNOSTIC  LEFT MAMMOGRAM  COMPARISON:  Previous exams.  ACR Breast Density Category b: There are scattered areas of fibroglandular density.  FINDINGS: The initially questioned mass seen in the superior left breast on the MLO view only resolves on the additional spot compression MLO view with no correlate on the CC view or spot compression CC view. Findings are consistent with superimposed breat tissue. There is no mammographic evidence of malignancy in the left breast.  IMPRESSION: Initially questioned left breast mass/asymmetry resolves on the additional imaging compatible with superimposed breast tissue; there is no mammographic evidence of malignancy in the left breast.  RECOMMENDATION: Screening mammogram in one year.(Code:[id number]-B-01Y)  I have discussed the findings and recommendations with the patient. Results were also provided in writing at the conclusion of the visit. If applicable, a reminder letter will be sent to the patient regarding the next appointment.  BI-RADS CATEGORY  1: Negative.   Electronically Signed   By: Edwin Cap M.D.   On: 01/27/2014 14:55    Microbiology: No results found for this or any previous visit (from the past 240 hour(s)).   Labs: Basic Metabolic Panel:  Recent Labs Lab 02/19/14 1950  NA 137  K 3.8  CL 99  CO2 24  GLUCOSE 326*  BUN 10  CREATININE 0.57  CALCIUM 8.9   Liver Function Tests:  Recent Labs Lab 02/19/14 1950  AST 15  ALT 17  ALKPHOS 134*  BILITOT 0.5  PROT 7.4  ALBUMIN 3.4*   No results found for this basename: LIPASE, AMYLASE,  in the last 168 hours No  results found for this basename: AMMONIA,  in the last 168 hours CBC:  Recent Labs Lab 02/19/14 1950  WBC 11.4*  NEUTROABS 8.0*  HGB 13.3  HCT 38.5  MCV 84.2  PLT 302   Cardiac Enzymes: No  results found for this basename: CKTOTAL, CKMB, CKMBINDEX, TROPONINI,  in the last 168 hours BNP: BNP (last 3 results) No results found for this basename: PROBNP,  in the last 8760 hours CBG:  Recent Labs Lab 02/21/14 0723 02/21/14 1131 02/21/14 1652 02/21/14 2219 02/22/14 0741  GLUCAP 200* 202* 155* 201* 205*       Signed:  Jonette Mate N  [facility] 02/22/2014, 10:39 AM

## 2014-02-22 NOTE — Progress Notes (Signed)
Noted patient is being discharged home today.  Talked with patient and her husband regarding diabetes and home regimen for diabetes control.  Patient reports that she uses and insulin pump with Humalog insulin.  She is followed by Dr. Elvera Lennox (Endocrinologist) and Dr. Dwana Melena is her PCP.  In inquiring about her insulin pump, patient reports that she does not have her insulin pump here at the hospital and it is at home.  She is planning to resume her insulin pump once she gets home today.  Patient has a [employer] insulin pump but she is not sure of any of the settings except her basal rate which she reports is 1.2 units/hour.  According to note by Dr. Elvera Lennox on 10/19/2013 the following are her settings (but not able to verify since insulin pump is at home):  Basal rate  1.2 units/hour (28.8 units/day) ICR  1:7.5 (1 unit for every 7.5 grams of carbs) ISF  1:20 (1 unit drops glucose 20 mg/dl)  Discussed Lantus and Novolog regimen used while inpatient and informed patient that she last received Lantus 10 units last night at 21:38 which would be active for about 24 hours.  Discussed increased risk of hypoglycemia with active Lantus with addition of resuming insulin pump once she gets home.  Encouraged patient to check blood glucose at least every 4 hours over the next 24 hours and anytime she felt like her blood sugar may be low.  Patient verbalized understanding of information discussed and reports that she will check her blood sugar closely over the next 24 hours. Patient reports that she does not have any other questions related to diabetes at this time.  RN in with discharge paperwork as patient is getting ready to be discharged.  Thanks, Orlando Penner, RN, MSN, CCRN Diabetes Coordinator Inpatient Diabetes Program [phone] (Team Pager) [phone] ([facility] office) [phone] [facility] office)

## 2014-02-25 ENCOUNTER — Encounter (HOSPITAL_COMMUNITY): Admission: RE | Payer: Self-pay | Source: Ambulatory Visit

## 2014-02-25 ENCOUNTER — Ambulatory Visit (HOSPITAL_COMMUNITY)
Admission: RE | Admit: 2014-02-25 | Payer: Managed Care, Other (non HMO) | Source: Ambulatory Visit | Admitting: General Surgery

## 2014-02-25 SURGERY — REPAIR, HERNIA, UMBILICAL, ADULT
Anesthesia: General

## 2014-02-28 ENCOUNTER — Emergency Department (HOSPITAL_COMMUNITY): Payer: Managed Care, Other (non HMO)

## 2014-02-28 ENCOUNTER — Encounter (HOSPITAL_COMMUNITY): Payer: Self-pay | Admitting: Emergency Medicine

## 2014-02-28 ENCOUNTER — Emergency Department (HOSPITAL_COMMUNITY)
Admission: EM | Admit: 2014-02-28 | Discharge: 2014-02-28 | Disposition: A | Discharge status: Home / Self Care | Payer: Managed Care, Other (non HMO) | Attending: Emergency Medicine | Admitting: Emergency Medicine

## 2014-02-28 DIAGNOSIS — R112 Nausea with vomiting, unspecified: Secondary | ICD-10-CM | POA: Insufficient documentation

## 2014-02-28 DIAGNOSIS — E119 Type 2 diabetes mellitus without complications: Secondary | ICD-10-CM | POA: Insufficient documentation

## 2014-02-28 DIAGNOSIS — Z794 Long term (current) use of insulin: Secondary | ICD-10-CM | POA: Insufficient documentation

## 2014-02-28 DIAGNOSIS — H9209 Otalgia, unspecified ear: Secondary | ICD-10-CM | POA: Insufficient documentation

## 2014-02-28 DIAGNOSIS — Z880 Allergy status to penicillin: Secondary | ICD-10-CM | POA: Insufficient documentation

## 2014-02-28 DIAGNOSIS — R61 Generalized hyperhidrosis: Secondary | ICD-10-CM | POA: Insufficient documentation

## 2014-02-28 DIAGNOSIS — R42 Dizziness and giddiness: Secondary | ICD-10-CM | POA: Insufficient documentation

## 2014-02-28 DIAGNOSIS — R630 Anorexia: Secondary | ICD-10-CM | POA: Insufficient documentation

## 2014-02-28 DIAGNOSIS — Z79899 Other long term (current) drug therapy: Secondary | ICD-10-CM | POA: Insufficient documentation

## 2014-02-28 DIAGNOSIS — Z87442 Personal history of urinary calculi: Secondary | ICD-10-CM | POA: Insufficient documentation

## 2014-02-28 LAB — CBG MONITORING, ED: Glucose-Capillary: 201 mg/dL — ABNORMAL HIGH (ref 70–99)

## 2014-02-28 MED ORDER — LORAZEPAM 1 MG PO TABS: 1.0000 mg | ORAL_TABLET | Freq: Two times a day (BID) | ORAL | Status: DC | PRN

## 2014-02-28 MED ORDER — LORAZEPAM 1 MG PO TABS
1.0000 mg | ORAL_TABLET | Freq: Once | ORAL | Status: AC
  Administered 2014-02-28: 1 mg via ORAL
  Filled 2014-02-28: qty 1

## 2014-02-28 MED ORDER — MECLIZINE HCL 25 MG PO TABS: 25.0000 mg | ORAL_TABLET | Freq: Four times a day (QID) | ORAL | Status: DC

## 2014-02-28 MED ORDER — MECLIZINE HCL 12.5 MG PO TABS
25.0000 mg | ORAL_TABLET | Freq: Once | ORAL | Status: AC
  Administered 2014-02-28: 25 mg via ORAL
  Filled 2014-02-28: qty 2

## 2014-02-28 MED ORDER — ONDANSETRON 8 MG PO TBDP
8.0000 mg | ORAL_TABLET | Freq: Once | ORAL | Status: AC
  Administered 2014-02-28: 8 mg via ORAL
  Filled 2014-02-28: qty 1

## 2014-02-28 NOTE — Discharge Instructions (Signed)
Benign Positional Vertigo Vertigo means you feel like you or your surroundings are moving when they are not. Benign positional vertigo is the most common form of vertigo. Benign means that the cause of your condition is not serious. Benign positional vertigo is more common in older adults. CAUSES  Benign positional vertigo is the result of an upset in the labyrinth system. This is an area in the middle ear that helps control your balance. This may be caused by a viral infection, head injury, or repetitive motion. However, often no specific cause is found. SYMPTOMS  Symptoms of benign positional vertigo occur when you move your head or eyes in different directions. Some of the symptoms may include:  Loss of balance and falls.  Vomiting.  Blurred vision.  Dizziness.  Nausea.  Involuntary eye movements (nystagmus). DIAGNOSIS  Benign positional vertigo is usually diagnosed by physical exam. If the specific cause of your benign positional vertigo is unknown, your caregiver may perform imaging tests, such as magnetic resonance imaging (MRI) or computed tomography (CT). TREATMENT  Your caregiver may recommend movements or procedures to correct the benign positional vertigo. Medicines such as meclizine, benzodiazepines, and medicines for nausea may be used to treat your symptoms. In rare cases, if your symptoms are caused by certain conditions that affect the inner ear, you may need surgery. HOME CARE INSTRUCTIONS   Follow your caregiver's instructions.  Move slowly. Do not make sudden body or head movements.  Avoid driving.  Avoid operating heavy machinery.  Avoid performing any tasks that would be dangerous to you or others during a vertigo episode.  Drink enough fluids to keep your urine clear or pale yellow. SEEK IMMEDIATE MEDICAL CARE IF:   You develop problems with walking, weakness, numbness, or using your arms, hands, or legs.  You have difficulty speaking.  You develop  severe headaches.  Your nausea or vomiting continues or gets worse.  You develop visual changes.  Your family or friends notice any behavioral changes.  Your condition gets worse.  You have a fever.  You develop a stiff neck or sensitivity to light. MAKE SURE YOU:   Understand these instructions.  Will watch your condition.  Will get help right away if you are not doing well or get worse. Document Released: 09/03/2006 Document Revised: 02/18/2012 Document Reviewed: 08/16/2011 [facility] Patient Information 2014 [place], [place].   CT head was negative. Medication for dizziness and relaxation. Rest. Followup your primary care Dr.

## 2014-02-28 NOTE — ED Provider Notes (Signed)
CSN: [id number]     Arrival date & time 02/28/14  1036 History  This chart was scribed for [name] Hutching, MD by [name] Milch, ED Scribe. This patient was seen in room APA10/APA10 and the patient's care was started at 10:49 AM.    Chief Complaint  Patient presents with  . Dizziness  . Otalgia      Patient is a 48 y.o. female presenting with ear pain. The history is provided by the patient. No language interpreter was used.  Otalgia  HPI Comments: [name] is a 48 y.o. female with a h/o DM who presents to the Emergency Department complaining of dizziness that began 2 days ago. Pt states that when she woke up Friday she felt sweaty and had pain in her left ear as well as some dizziness and nausea resulting in an episode of vomiting a yellow substance. She reports she slept the rest of the day and had decreased appetite. Pt states that on Saturday her symptoms continued and she also felt lightheaded as though she may "pass out." She reports that movement and trying to walk increase her dizziness and lying down helps to reduce it.    Past Medical History  Diagnosis Date  . Diabetes mellitus     insulin pump 2013  . Kidney stone     Past Surgical History  Procedure Laterality Date  . Cesarean section  1994;2000    [place]  . Cholecystectomy  1992  . Sinus sergery  1988    [place]  . Foot surgery  March, 2013    [facility]  . Hysteroscopy with thermachoice  04/25/2012    Procedure: HYSTEROSCOPY WITH THERMACHOICE;  Surgeon: Lazaro Arms, MD;  Location: [facility];  Service: Gynecology;  Laterality: N/A;  total therapy time= 11 minutes 42 seconds; 34 ml D5w  in and 34 ml D5w out; temp =87 degrees F  . Cystoscopy w/ ureteral stent placement  05/08/2012    Procedure: CYSTOSCOPY WITH RETROGRADE PYELOGRAM/URETERAL STENT PLACEMENT;  Surgeon: Ky Barban, MD;  Location: [facility];  Service: Urology;  Laterality: Right;    Family History  Problem Relation Age of Onset  .  Heart disease    . Cancer    . Diabetes    . Achalasia    . Anesthesia problems Neg Hx   . Hypotension Neg Hx   . Malignant hyperthermia Neg Hx   . Pseudochol deficiency Neg Hx   . Diabetes Mother   . Depression Paternal Grandmother   . Depression Paternal Grandfather     History  Substance Use Topics  . Smoking status: Never Smoker   . Smokeless tobacco: Never Used  . Alcohol Use: No    OB History   Grav Para Term Preterm Abortions TAB SAB Ect Mult Living                 Review of Systems  HENT: Positive for ear pain.    A complete 10 system review of systems was obtained and all systems are negative except as noted in the HPI and PMH.     Allergies  Ciprofloxacin; Penicillins; Codeine; Morphine; Sulfonamide derivatives; and Vancomycin  Home Medications   Current Outpatient Rx  Name  Route  Sig  Dispense  Refill  . ascorbic acid (VITAMIN C) 250 MG CHEW   Oral   Chew 250 mg by mouth daily.          Marland Kitchen doxycycline (VIBRA-TABS) 100 MG tablet  Oral   Take 1 tablet by mouth 2 (two) times daily. For 10 days, started on 02/22/2014         . gabapentin (NEURONTIN) 100 MG capsule   Oral   Take 100-200 mg by mouth 3 (three) times daily. Takes one capsule in the morning and one capsule in the afternoon, then takes two capsules at bedtime         . HYDROcodone-acetaminophen (NORCO/VICODIN) 5-325 MG per tablet   Oral   Take 1 tablet by mouth every 6 (six) hours as needed for moderate pain.          Marland Kitchen lisinopril (PRINIVIL,ZESTRIL) 5 MG tablet   Oral   Take 5 mg by mouth daily.         . metFORMIN (GLUCOPHAGE) 500 MG tablet   Oral   Take 1,000 mg by mouth 2 (two) times daily with a meal.          . glucagon (GLUCAGON EMERGENCY) 1 MG injection      Inject in the muscle as needed for hypoglycemia   1 each   2   . Insulin Human (INSULIN PUMP) SOLN   Subcutaneous   Inject into the skin continuous. insulin lispro (HUMALOG) 100 UNIT/ML injection          . Lidocaine HCl 4 % SOLN      Use as needed with pump insertions   Patient needs a prescription hand written.   1 vial   3   . LORazepam (ATIVAN) 1 MG tablet   Oral   Take 1 tablet (1 mg total) by mouth 2 (two) times daily as needed (dizziness).   15 tablet   0   . meclizine (ANTIVERT) 25 MG tablet   Oral   Take 1 tablet (25 mg total) by mouth 4 (four) times daily.   15 tablet   0   . EXPIRED: nitroGLYCERIN (NITROSTAT) 0.4 MG SL tablet   Sublingual   Place 1 tablet (0.4 mg total) under the tongue every 5 (five) minutes as needed for chest pain. Go to ER if 2nd dose needed   20 tablet   1   . simvastatin (ZOCOR) 10 MG tablet   Oral   Take 1 tablet (10 mg total) by mouth at bedtime. For cholesterol   30 tablet   3   . triamcinolone cream (KENALOG) 0.1 %   Topical   Apply 1 application topically daily as needed (rash).           Triage Vitals: BP 122/76  Pulse 88  Temp(Src) 97.9 F (36.6 C) (Oral)  Resp 20  SpO2 98%  Physical Exam  Nursing note and vitals reviewed. Constitutional: She is oriented to person, place, and time. She appears well-developed and well-nourished.  HENT:  Head: Normocephalic.  Right Ear: External ear normal.  Left Ear: External ear normal.  Neurological: She is alert and oriented to person, place, and time.  Skin: Skin is warm and dry.  Psychiatric: She has a normal mood and affect. Her behavior is normal.    ED Course  Procedures (including critical care time)  DIAGNOSTIC STUDIES: Oxygen Saturation is 98% on RA, normal by my interpretation.    COORDINATION OF CARE: 10:53 AM- Will order Head CT and Ativan. Pt advised of plan for treatment and pt agrees.   Labs Review Labs Reviewed  CBG MONITORING, ED - Abnormal; Notable for the following:    Glucose-Capillary 201 (*)    All other components  within normal limits   Imaging Review Ct Head Wo Contrast  02/28/2014   CLINICAL DATA:  Dizziness  EXAM: CT HEAD WITHOUT CONTRAST   TECHNIQUE: Contiguous axial images were obtained from the base of the skull through the vertex without intravenous contrast.  COMPARISON:  01/16/2008  FINDINGS: No evidence of parenchymal hemorrhage or extra-axial fluid collection. No mass lesion, mass effect, or midline shift.  No CT evidence of acute infarction.  Cerebral volume is within normal limits.  No ventriculomegaly.  The visualized paranasal sinuses are essentially clear. The mastoid air cells are unopacified.  No evidence of calvarial fracture.  IMPRESSION: Normal head CT.   Electronically Signed   By: Charline Bills M.D.   On: 02/28/2014 11:56     EKG Interpretation None      MDM   Final diagnoses:  Vertigo   History and physical consistent with vertigo. Head CT negative. Discharge medications Antivert 25 mg and Ativan 1 mg  I personally performed the services described in this documentation, which was scribed in my presence. The recorded information has been reviewed and is accurate.     [name] Hutching, MD 02/28/14 1348

## 2014-02-28 NOTE — ED Notes (Signed)
Pt alert & oriented x4, stable gait. Patient given discharge instructions, paperwork & prescription(s). Patient  instructed to stop at the registration desk to finish any additional paperwork. Patient verbalized understanding. Pt left department w/ no further questions. 

## 2014-02-28 NOTE — ED Notes (Signed)
Pt c/o intermittent dizziness since Friday morning. Pt states symptoms worsen when she stands up. Pt also reports intermittent nausea and vomiting. Pt c/o pain in left ear.

## 2014-03-09 ENCOUNTER — Encounter (HOSPITAL_COMMUNITY): Payer: Self-pay

## 2014-03-09 ENCOUNTER — Encounter (HOSPITAL_COMMUNITY)
Admission: RE | Admit: 2014-03-09 | Discharge: 2014-03-09 | Disposition: A | Discharge status: Home / Self Care | Payer: Managed Care, Other (non HMO) | Source: Ambulatory Visit | Attending: General Surgery | Admitting: General Surgery

## 2014-03-09 HISTORY — DX: Essential (primary) hypertension: I10

## 2014-03-09 HISTORY — DX: Pure hypercholesterolemia, unspecified: E78.00

## 2014-03-09 HISTORY — DX: Shortness of breath: R06.02

## 2014-03-09 HISTORY — DX: Polyneuropathy, unspecified: G62.9

## 2014-03-09 LAB — BASIC METABOLIC PANEL
BUN: 7 mg/dL (ref 6–23)
CALCIUM: 8.8 mg/dL (ref 8.4–10.5)
CO2: 21 meq/L (ref 19–32)
CREATININE: 0.48 mg/dL — AB (ref 0.50–1.10)
Chloride: 101 mEq/L (ref 96–112)
GFR calc Af Amer: >90 mL/min (ref >90)
GFR calc non Af Amer: >90 mL/min (ref >90)
Glucose, Bld: 357 mg/dL — ABNORMAL HIGH (ref 70–99)
Potassium: 4 mEq/L (ref 3.7–5.3)
Sodium: 137 mEq/L (ref 137–147)

## 2014-03-09 LAB — CBC WITH DIFFERENTIAL/PLATELET
BASOS ABS: 0 10*3/uL (ref 0.0–0.1)
Basophils Relative: 0 % (ref 0–1)
EOS PCT: 2 % (ref 0–5)
Eosinophils Absolute: 0.1 10*3/uL (ref 0.0–0.7)
HCT: 37.4 % (ref 36.0–46.0)
Hemoglobin: 12.7 g/dL (ref 12.0–15.0)
Lymphocytes Relative: 28 % (ref 12–46)
Lymphs Abs: 2.4 10*3/uL (ref 0.7–4.0)
MCH: 28.8 pg (ref 26.0–34.0)
MCHC: 34 g/dL (ref 30.0–36.0)
MCV: 84.8 fL (ref 78.0–100.0)
MONO ABS: 0.6 10*3/uL (ref 0.1–1.0)
Monocytes Relative: 7 % (ref 3–12)
Neutro Abs: 5.6 10*3/uL (ref 1.7–7.7)
Neutrophils Relative %: 63 % (ref 43–77)
Platelets: 282 10*3/uL (ref 150–400)
RBC: 4.41 MIL/uL (ref 3.87–5.11)
RDW: 13.1 % (ref 11.5–15.5)
WBC: 8.7 10*3/uL (ref 4.0–10.5)

## 2014-03-09 LAB — HCG, SERUM, QUALITATIVE: Preg, Serum: NEGATIVE

## 2014-03-09 NOTE — Patient Instructions (Addendum)
ELOWYN RAUPP  03/09/2014   Your procedure is scheduled on:  03/11/2014  Report to [facility] at  1000 AM.  Call this number if you have problems the morning of surgery: [phone]   Remember:   Do not eat food or drink liquids after midnight.   Take these medicines the morning of surgery with A SIP OF WATER: neurontin, hydrocodone, lisinopril, ativan, antivert   Do not wear jewelry, make-up or nail polish.  Do not wear lotions, powders, or perfumes.   Do not shave 48 hours prior to surgery. Men may shave face and neck.  Do not bring valuables to the hospital.  [facility] is not responsible for any belongings or valuables.               Contacts, dentures or bridgework may not be worn into surgery.  Leave suitcase in the car. After surgery it may be brought to your room.  For patients admitted to the hospital, discharge time is determined by your treatment team.               Patients discharged the day of surgery will not be allowed to drive home.  Name and phone number of your driver: family  Special Instructions: Shower using CHG 2 nights before surgery and the night before surgery.  If you shower the day of surgery use CHG.  Use special wash - you have one bottle of CHG for all showers.  You should use approximately 1/3 of the bottle for each shower.   Please read over the following fact sheets that you were given: Pain Booklet, Coughing and Deep Breathing, Surgical Site Infection Prevention, Anesthesia Post-op Instructions and Care and Recovery After Surgery Hernia A hernia occurs when an internal organ pushes out through a weak spot in the abdominal wall. Hernias most commonly occur in the groin and around the navel. Hernias often can be pushed back into place (reduced). Most hernias tend to get worse over time. Some abdominal hernias can get stuck in the opening (irreducible or incarcerated hernia) and cannot be reduced. An irreducible abdominal hernia which is tightly  squeezed into the opening is at risk for impaired blood supply (strangulated hernia). A strangulated hernia is a medical emergency. Because of the risk for an irreducible or strangulated hernia, surgery may be recommended to repair a hernia. CAUSES   Heavy lifting.  Prolonged coughing.  Straining to have a bowel movement.  A cut (incision) made during an abdominal surgery. HOME CARE INSTRUCTIONS   Bed rest is not required. You may continue your normal activities.  Avoid lifting more than 10 pounds (4.5 kg) or straining.  Cough gently. If you are a smoker it is best to stop. Even the best hernia repair can break down with the continual strain of coughing. Even if you do not have your hernia repaired, a cough will continue to aggravate the problem.  Do not wear anything tight over your hernia. Do not try to keep it in with an outside bandage or truss. These can damage abdominal contents if they are trapped within the hernia sac.  Eat a normal diet.  Avoid constipation. Straining over long periods of time will increase hernia size and encourage breakdown of repairs. If you cannot do this with diet alone, stool softeners may be used. SEEK IMMEDIATE MEDICAL CARE IF:   You have a fever.  You develop increasing abdominal pain.  You feel nauseous or vomit.  Your hernia  is stuck outside the abdomen, looks discolored, feels hard, or is tender.  You have any changes in your bowel habits or in the hernia that are unusual for you.  You have increased pain or swelling around the hernia.  You cannot push the hernia back in place by applying gentle pressure while lying down. MAKE SURE YOU:   Understand these instructions.  Will watch your condition.  Will get help right away if you are not doing well or get worse. Document Released: 11/26/2005 Document Revised: 02/18/2012 Document Reviewed: 07/15/2008 [facility] Patient Information 2014 [place], [place]. PATIENT  INSTRUCTIONS POST-ANESTHESIA  IMMEDIATELY FOLLOWING SURGERY:  Do not drive or operate machinery for the first twenty four hours after surgery.  Do not make any important decisions for twenty four hours after surgery or while taking narcotic pain medications or sedatives.  If you develop intractable nausea and vomiting or a severe headache please notify your doctor immediately.  FOLLOW-UP:  Please make an appointment with your surgeon as instructed. You do not need to follow up with anesthesia unless specifically instructed to do so.  WOUND CARE INSTRUCTIONS (if applicable):  Keep a dry clean dressing on the anesthesia/puncture wound site if there is drainage.  Once the wound has quit draining you may leave it open to air.  Generally you should leave the bandage intact for twenty four hours unless there is drainage.  If the epidural site drains for more than 36-48 hours please call the anesthesia department.  QUESTIONS?:  Please feel free to call your physician or the hospital operator if you have any questions, and they will be happy to assist you.

## 2014-03-10 NOTE — Pre-Procedure Instructions (Signed)
Patient glucose is 357. Dr Jayme Cloud aware will recheck CBG am of surgery.

## 2014-03-10 NOTE — Pre-Procedure Instructions (Signed)
Glucose of 357 called to Dr Malvin Johns.

## 2014-03-11 ENCOUNTER — Encounter (HOSPITAL_COMMUNITY): Payer: Managed Care, Other (non HMO) | Admitting: Anesthesiology

## 2014-03-11 ENCOUNTER — Ambulatory Visit (HOSPITAL_COMMUNITY)
Admission: RE | Admit: 2014-03-11 | Discharge: 2014-03-11 | Disposition: A | Discharge status: Home / Self Care | Payer: Managed Care, Other (non HMO) | Source: Ambulatory Visit | Attending: General Surgery | Admitting: General Surgery

## 2014-03-11 ENCOUNTER — Encounter (HOSPITAL_COMMUNITY)
Admission: RE | Disposition: A | Discharge status: Home / Self Care | Payer: Self-pay | Source: Ambulatory Visit | Attending: General Surgery

## 2014-03-11 ENCOUNTER — Ambulatory Visit (HOSPITAL_COMMUNITY): Payer: Managed Care, Other (non HMO) | Admitting: Anesthesiology

## 2014-03-11 ENCOUNTER — Encounter (HOSPITAL_COMMUNITY): Payer: Self-pay | Admitting: *Deleted

## 2014-03-11 DIAGNOSIS — Z5309 Procedure and treatment not carried out because of other contraindication: Secondary | ICD-10-CM | POA: Insufficient documentation

## 2014-03-11 DIAGNOSIS — K429 Umbilical hernia without obstruction or gangrene: Secondary | ICD-10-CM | POA: Insufficient documentation

## 2014-03-11 DIAGNOSIS — E119 Type 2 diabetes mellitus without complications: Secondary | ICD-10-CM | POA: Insufficient documentation

## 2014-03-11 DIAGNOSIS — I10 Essential (primary) hypertension: Secondary | ICD-10-CM | POA: Insufficient documentation

## 2014-03-11 DIAGNOSIS — Z794 Long term (current) use of insulin: Secondary | ICD-10-CM | POA: Insufficient documentation

## 2014-03-11 LAB — GLUCOSE, CAPILLARY: Glucose-Capillary: 226 mg/dL — ABNORMAL HIGH (ref 70–99)

## 2014-03-11 SURGERY — REPAIR, HERNIA, UMBILICAL, ADULT
Anesthesia: General

## 2014-03-11 MED ORDER — LACTATED RINGERS IV SOLN
INTRAVENOUS | Status: DC
  Administered 2014-03-11: 11:00:00 via INTRAVENOUS

## 2014-03-11 MED ORDER — NEOSTIGMINE METHYLSULFATE 1 MG/ML IJ SOLN
INTRAMUSCULAR | Status: DC | PRN
  Administered 2014-03-11: 2 mg via INTRAVENOUS
  Administered 2014-03-11: 4 mg via INTRAVENOUS
  Administered 2014-03-11: 1 mg via INTRAVENOUS

## 2014-03-11 MED ORDER — FENTANYL CITRATE 0.05 MG/ML IJ SOLN
INTRAMUSCULAR | Status: AC
  Filled 2014-03-11: qty 5

## 2014-03-11 MED ORDER — FENTANYL CITRATE 0.05 MG/ML IJ SOLN: 25.0000 ug | INTRAMUSCULAR | Status: DC | PRN

## 2014-03-11 MED ORDER — DOXYCYCLINE HYCLATE 100 MG IV SOLR
100.0000 mg | Freq: Once | INTRAVENOUS | Status: AC
  Administered 2014-03-11: 100 mg via INTRAVENOUS
  Filled 2014-03-11: qty 100

## 2014-03-11 MED ORDER — FENTANYL CITRATE 0.05 MG/ML IJ SOLN
INTRAMUSCULAR | Status: DC | PRN
  Administered 2014-03-11: 50 ug via INTRAVENOUS
  Administered 2014-03-11: 25 ug via INTRAVENOUS
  Administered 2014-03-11: 50 ug via INTRAVENOUS

## 2014-03-11 MED ORDER — MIDAZOLAM HCL 2 MG/2ML IJ SOLN
1.0000 mg | INTRAMUSCULAR | Status: DC | PRN
  Administered 2014-03-11: 2 mg via INTRAVENOUS

## 2014-03-11 MED ORDER — GLYCOPYRROLATE 0.2 MG/ML IJ SOLN
INTRAMUSCULAR | Status: AC
  Filled 2014-03-11: qty 4

## 2014-03-11 MED ORDER — ROCURONIUM BROMIDE 50 MG/5ML IV SOLN
INTRAVENOUS | Status: AC
  Filled 2014-03-11: qty 1

## 2014-03-11 MED ORDER — PROPOFOL 10 MG/ML IV EMUL
INTRAVENOUS | Status: AC
  Filled 2014-03-11: qty 20

## 2014-03-11 MED ORDER — ONDANSETRON HCL 4 MG/2ML IJ SOLN
INTRAMUSCULAR | Status: AC
  Filled 2014-03-11: qty 2

## 2014-03-11 MED ORDER — LIDOCAINE HCL (CARDIAC) 10 MG/ML IV SOLN
INTRAVENOUS | Status: DC | PRN
Start: 2014-03-11 — End: 2014-03-11
  Administered 2014-03-11: 10 mg via INTRAVENOUS

## 2014-03-11 MED ORDER — BUPIVACAINE HCL (PF) 0.5 % IJ SOLN
INTRAMUSCULAR | Status: AC
  Filled 2014-03-11: qty 30

## 2014-03-11 MED ORDER — MIDAZOLAM HCL 2 MG/2ML IJ SOLN
INTRAMUSCULAR | Status: AC
  Filled 2014-03-11: qty 2

## 2014-03-11 MED ORDER — BACITRACIN ZINC 500 UNIT/GM EX OINT
TOPICAL_OINTMENT | CUTANEOUS | Status: AC
  Filled 2014-03-11: qty 1.8

## 2014-03-11 MED ORDER — BACITRACIN 50000 UNITS IM SOLR
INTRAMUSCULAR | Status: AC
  Filled 2014-03-11: qty 1

## 2014-03-11 MED ORDER — SODIUM CHLORIDE 0.9 % IJ SOLN
INTRAMUSCULAR | Status: AC
  Filled 2014-03-11: qty 10

## 2014-03-11 MED ORDER — PROPOFOL 10 MG/ML IV BOLUS
INTRAVENOUS | Status: DC | PRN
  Administered 2014-03-11: 200 mg via INTRAVENOUS

## 2014-03-11 MED ORDER — GLYCOPYRROLATE 0.2 MG/ML IJ SOLN
INTRAMUSCULAR | Status: AC
  Filled 2014-03-11: qty 2

## 2014-03-11 MED ORDER — LIDOCAINE HCL (PF) 1 % IJ SOLN
INTRAMUSCULAR | Status: AC
  Filled 2014-03-11: qty 5

## 2014-03-11 MED ORDER — ONDANSETRON HCL 4 MG/2ML IJ SOLN
4.0000 mg | Freq: Once | INTRAMUSCULAR | Status: AC
  Administered 2014-03-11: 4 mg via INTRAVENOUS

## 2014-03-11 MED ORDER — ONDANSETRON HCL 4 MG/2ML IJ SOLN: 4.0000 mg | Freq: Once | INTRAMUSCULAR | Status: DC | PRN

## 2014-03-11 MED ORDER — GLYCOPYRROLATE 0.2 MG/ML IJ SOLN
INTRAMUSCULAR | Status: DC | PRN
  Administered 2014-03-11: 0.2 mg via INTRAVENOUS
  Administered 2014-03-11: .8 mg via INTRAVENOUS
  Administered 2014-03-11: 0.2 mg via INTRAVENOUS

## 2014-03-11 MED ORDER — ROCURONIUM BROMIDE 100 MG/10ML IV SOLN
INTRAVENOUS | Status: DC | PRN
  Administered 2014-03-11: 5 mg via INTRAVENOUS
  Administered 2014-03-11: 35 mg via INTRAVENOUS

## 2014-03-11 MED ORDER — GLYCOPYRROLATE 0.2 MG/ML IJ SOLN
INTRAMUSCULAR | Status: AC
  Filled 2014-03-11: qty 1

## 2014-03-11 SURGICAL SUPPLY — 44 items
ATTRACTOMAT 16X20 MAGNETIC DRP (DRAPES) ×3 IMPLANT
BAG HAMPER (MISCELLANEOUS) ×3 IMPLANT
CLEANER TIP ELECTROSURG 2X2 (MISCELLANEOUS) ×3 IMPLANT
CLOTH BEACON ORANGE TIMEOUT ST (SAFETY) ×3 IMPLANT
COVER LIGHT HANDLE STERIS (MISCELLANEOUS) ×6 IMPLANT
DECANTER SPIKE VIAL GLASS SM (MISCELLANEOUS) ×3 IMPLANT
ELECT REM PT RETURN 9FT ADLT (ELECTROSURGICAL) ×2
ELECTRODE REM PT RTRN 9FT ADLT (ELECTROSURGICAL) ×2 IMPLANT
FORMALIN 10 PREFIL 120ML (MISCELLANEOUS) ×1 IMPLANT
GLOVE BIOGEL M 7.0 STRL (GLOVE) ×4 IMPLANT
GLOVE BIOGEL PI IND STRL 7.0 (GLOVE) ×2 IMPLANT
GLOVE BIOGEL PI INDICATOR 7.0 (GLOVE) ×2
GLOVE EXAM NITRILE MD LF STRL (GLOVE) ×2 IMPLANT
GLOVE SKINSENSE NS SZ7.0 (GLOVE) ×1
GLOVE SKINSENSE STRL SZ7.0 (GLOVE) ×2 IMPLANT
GOWN STRL REUS W/TWL LRG LVL3 (GOWN DISPOSABLE) ×6 IMPLANT
INST SET MINOR GENERAL (KITS) ×3 IMPLANT
KIT ROOM TURNOVER APOR (KITS) ×3 IMPLANT
MANIFOLD NEPTUNE II (INSTRUMENTS) ×3 IMPLANT
NS IRRIG 1000ML POUR BTL (IV SOLUTION) ×3 IMPLANT
PACK MINOR (CUSTOM PROCEDURE TRAY) ×3 IMPLANT
PAD ARMBOARD 7.5X6 YLW CONV (MISCELLANEOUS) ×3 IMPLANT
SET BASIN LINEN APH (SET/KITS/TRAYS/PACK) ×3 IMPLANT
SOL PREP PROV IODINE SCRUB 4OZ (MISCELLANEOUS) ×3 IMPLANT
SPONGE GAUZE 4X4 12PLY (GAUZE/BANDAGES/DRESSINGS) ×3 IMPLANT
SPONGE INTESTINAL PEANUT (DISPOSABLE) ×3 IMPLANT
SPONGE LAP 18X18 X RAY DECT (DISPOSABLE) ×3 IMPLANT
STAPLER VISISTAT 35W (STAPLE) ×3 IMPLANT
SUT PROLENE 0 CT 1 CR/8 (SUTURE) IMPLANT
SUT PROLENE 1 CT 1 30 (SUTURE) IMPLANT
SUT PROLENE 4 0 PS 2 18 (SUTURE) IMPLANT
SUT PROLENE MO 6 BLUE #0 30IN (SUTURE) IMPLANT
SUT SILK 2 0 (SUTURE) ×2
SUT SILK 2-0 18XBRD TIE 12 (SUTURE) ×2 IMPLANT
SUT VIC AB 3-0 SH 27 (SUTURE)
SUT VIC AB 3-0 SH 27X BRD (SUTURE) ×1 IMPLANT
SUT VIC AB 5-0 P-3 18X BRD (SUTURE) IMPLANT
SUT VIC AB 5-0 P3 18 (SUTURE)
SUT VICRYL AB 3 0 TIES (SUTURE) ×3 IMPLANT
SUT VICRYL AB 3-0 BRD CT 36IN (SUTURE) ×2 IMPLANT
SYR BULB IRRIGATION 50ML (SYRINGE) ×3 IMPLANT
SYR CONTROL 10ML LL (SYRINGE) ×3 IMPLANT
TRAY FOLEY CATH 16FR SILVER (SET/KITS/TRAYS/PACK) IMPLANT
WATER STERILE IRR 1000ML POUR (IV SOLUTION) ×6 IMPLANT

## 2014-03-11 NOTE — Anesthesia Procedure Notes (Signed)
Procedure Name: Intubation Date/Time: 03/11/2014 12:14 PM Performed by: Franco Nones Pre-anesthesia Checklist: Patient identified, Patient being monitored, Timeout performed, Emergency Drugs available and Suction available Patient Re-evaluated:Patient Re-evaluated prior to inductionOxygen Delivery Method: Circle System Utilized Preoxygenation: Pre-oxygenation with 100% oxygen Intubation Type: IV induction, Rapid sequence and Cricoid Pressure applied Ventilation: Mask ventilation without difficulty Laryngoscope Size: Miller and 2 Grade View: Grade I Tube type: Oral Tube size: 7.0 mm Number of attempts: 1 Airway Equipment and Method: stylet Placement Confirmation: ETT inserted through vocal cords under direct vision,  positive ETCO2 and breath sounds checked- equal and bilateral Secured at: 21 cm Tube secured with: Tape Dental Injury: Teeth and Oropharynx as per pre-operative assessment

## 2014-03-11 NOTE — Progress Notes (Signed)
Pt was taken to OR and put to sleep but surgery not initiated as she had multiple areas of folliculitis on her vulva which she neglected to tell me about.  Will treat with doxycycline and postpone surgery for now.  Family informed; will follow up in office.

## 2014-03-11 NOTE — Transfer of Care (Addendum)
Immediate Anesthesia Transfer of Care Note  Patient: [name]  Procedure(s) Performed: Case cancelled after induction. Scheduled for Umbilical Hernia repair. Cancelled for Folliculitis per surgeon  Patient Location: PACU  Anesthesia Type: General  Level of Consciousness: awake  Airway & Oxygen Therapy: Patient Spontanous Breathing and non-rebreather face mask  Post-op Assessment: Report given to PACU RN, Post -op Vital signs reviewed and stable and Patient moving all extremities  Post vital signs: Reviewed and stable  Complications: No apparent anesthesia complications

## 2014-03-11 NOTE — Discharge Instructions (Signed)

## 2014-03-11 NOTE — Progress Notes (Signed)
12 yr. Old W. Female for elective repair of umbilical hernia.  Procedure and risks explained and informed consent obtained.  Blood sugar 226  No clinical change since H&P.  Filed Vitals:   03/11/14 1033  BP: 139/81  Pulse: 86  Temp: 98 F (36.7 C)  Resp: 18

## 2014-03-11 NOTE — OR Nursing (Signed)
After patient intubated, and I removed blankets to put foley catheter in, noticed large red area on patient's right upper thigh (warm to touch) and smaller red swollen area on perineum.  Dr. Malvin Johns was called to OR to assess at 1218.  Dr. Malvin Johns left OR to speak with family and returned at 1225 to cancel the case due to increased risk for infection.

## 2014-03-11 NOTE — Anesthesia Postprocedure Evaluation (Addendum)
Anesthesia Post Note  Patient: [name]  Procedure(s) Performed: * No procedures listed *  Anesthesia type: General  Patient location: PACU  Post pain: Pain level controlled  Post assessment: Post-op Vital signs reviewed, Patient's Cardiovascular Status Stable, Respiratory Function Stable, Patent Airway, No signs of Nausea or vomiting and Pain level controlled  Last Vitals:  Filed Vitals:   03/11/14 1259  BP: 129/65  Pulse: 95  Temp: 37.1 C  Resp: 17    Post vital signs: Reviewed and stable  Level of consciousness: awake and alert   Complications: No apparent anesthesia complications Scheduled for Umbilical hernia repair; Case cancelled after induction by surgeon for Folliculitis.

## 2014-03-11 NOTE — Anesthesia Preprocedure Evaluation (Addendum)
Anesthesia Evaluation  Patient identified by MRN, date of birth, ID band Patient awake    Reviewed: Allergy & Precautions, H&P , NPO status , Patient's Chart, lab work & pertinent test results  Airway Mallampati: II TM Distance: >3 FB Neck ROM: Full    Dental no notable dental hx. (+) Teeth Intact   Pulmonary shortness of breath and with exertion,  breath sounds clear to auscultation  Pulmonary exam normal       Cardiovascular hypertension, Pt. on medications negative cardio ROS  Rhythm:Regular Rate:Normal     Neuro/Psych  Headaches, PSYCHIATRIC DISORDERS Depression  Neuromuscular disease    GI/Hepatic negative GI ROS, Neg liver ROS,   Endo/Other  diabetes, Poorly Controlled, Type 2, Insulin DependentMorbid obesity  Renal/GU Renal diseasenegative Renal ROS     Musculoskeletal negative musculoskeletal ROS (+)   Abdominal (+) + obese,  Abdomen: soft.    Peds  Hematology negative hematology ROS (+)   Anesthesia Other Findings   Reproductive/Obstetrics negative OB ROS                          Anesthesia Physical Anesthesia Plan  ASA: III  Anesthesia Plan: General   Post-op Pain Management:    Induction: Intravenous  Airway Management Planned: Oral ETT  Additional Equipment:   Intra-op Plan:   Post-operative Plan: Extubation in OR  Informed Consent: I have reviewed the patients History and Physical, chart, labs and discussed the procedure including the risks, benefits and alternatives for the proposed anesthesia with the patient or authorized representative who has indicated his/her understanding and acceptance.     Plan Discussed with:   Anesthesia Plan Comments:         Anesthesia Quick Evaluation

## 2014-03-16 ENCOUNTER — Encounter (HOSPITAL_COMMUNITY): Payer: Self-pay | Admitting: General Surgery

## 2014-03-18 ENCOUNTER — Encounter (HOSPITAL_COMMUNITY): Payer: Self-pay | Admitting: General Surgery

## 2014-03-18 ENCOUNTER — Other Ambulatory Visit: Payer: Self-pay

## 2014-03-22 ENCOUNTER — Encounter (HOSPITAL_COMMUNITY): Payer: Self-pay | Admitting: Pharmacy Technician

## 2014-03-23 ENCOUNTER — Encounter (HOSPITAL_COMMUNITY): Payer: Self-pay

## 2014-03-23 ENCOUNTER — Encounter (HOSPITAL_COMMUNITY)
Admission: RE | Admit: 2014-03-23 | Discharge: 2014-03-23 | Disposition: A | Discharge status: Home / Self Care | Payer: Managed Care, Other (non HMO) | Source: Ambulatory Visit | Attending: General Surgery | Admitting: General Surgery

## 2014-03-25 ENCOUNTER — Encounter (HOSPITAL_COMMUNITY): Payer: Self-pay | Admitting: *Deleted

## 2014-03-25 ENCOUNTER — Encounter (HOSPITAL_COMMUNITY): Payer: Managed Care, Other (non HMO) | Admitting: Anesthesiology

## 2014-03-25 ENCOUNTER — Encounter (HOSPITAL_COMMUNITY)
Admission: RE | Disposition: A | Discharge status: Home / Self Care | Payer: Self-pay | Source: Ambulatory Visit | Attending: General Surgery

## 2014-03-25 ENCOUNTER — Ambulatory Visit (HOSPITAL_COMMUNITY)
Admission: RE | Admit: 2014-03-25 | Discharge: 2014-03-26 | Disposition: A | Discharge status: Home / Self Care | Payer: Managed Care, Other (non HMO) | Source: Ambulatory Visit | Attending: General Surgery | Admitting: General Surgery

## 2014-03-25 ENCOUNTER — Ambulatory Visit (HOSPITAL_COMMUNITY): Payer: Managed Care, Other (non HMO) | Admitting: Anesthesiology

## 2014-03-25 DIAGNOSIS — E669 Obesity, unspecified: Secondary | ICD-10-CM

## 2014-03-25 DIAGNOSIS — R102 Pelvic and perineal pain: Secondary | ICD-10-CM

## 2014-03-25 DIAGNOSIS — K420 Umbilical hernia with obstruction, without gangrene: Secondary | ICD-10-CM

## 2014-03-25 DIAGNOSIS — I10 Essential (primary) hypertension: Secondary | ICD-10-CM

## 2014-03-25 DIAGNOSIS — F32A Depression, unspecified: Secondary | ICD-10-CM

## 2014-03-25 DIAGNOSIS — IMO0002 Reserved for concepts with insufficient information to code with codable children: Secondary | ICD-10-CM

## 2014-03-25 DIAGNOSIS — Z9641 Presence of insulin pump (external) (internal): Secondary | ICD-10-CM | POA: Insufficient documentation

## 2014-03-25 DIAGNOSIS — L0390 Cellulitis, unspecified: Secondary | ICD-10-CM

## 2014-03-25 DIAGNOSIS — B372 Candidiasis of skin and nail: Secondary | ICD-10-CM

## 2014-03-25 DIAGNOSIS — R6882 Decreased libido: Secondary | ICD-10-CM

## 2014-03-25 DIAGNOSIS — R0600 Dyspnea, unspecified: Secondary | ICD-10-CM

## 2014-03-25 DIAGNOSIS — E785 Hyperlipidemia, unspecified: Secondary | ICD-10-CM

## 2014-03-25 DIAGNOSIS — E1165 Type 2 diabetes mellitus with hyperglycemia: Secondary | ICD-10-CM

## 2014-03-25 DIAGNOSIS — R51 Headache: Secondary | ICD-10-CM

## 2014-03-25 DIAGNOSIS — F329 Major depressive disorder, single episode, unspecified: Secondary | ICD-10-CM

## 2014-03-25 DIAGNOSIS — G629 Polyneuropathy, unspecified: Secondary | ICD-10-CM

## 2014-03-25 DIAGNOSIS — E109 Type 1 diabetes mellitus without complications: Secondary | ICD-10-CM | POA: Insufficient documentation

## 2014-03-25 DIAGNOSIS — Z794 Long term (current) use of insulin: Secondary | ICD-10-CM | POA: Insufficient documentation

## 2014-03-25 DIAGNOSIS — N200 Calculus of kidney: Secondary | ICD-10-CM

## 2014-03-25 DIAGNOSIS — F3289 Other specified depressive episodes: Secondary | ICD-10-CM | POA: Insufficient documentation

## 2014-03-25 HISTORY — DX: Umbilical hernia with obstruction, without gangrene: K42.0

## 2014-03-25 HISTORY — PX: UMBILICAL HERNIA REPAIR: SHX196

## 2014-03-25 LAB — GLUCOSE, CAPILLARY
GLUCOSE-CAPILLARY: 122 mg/dL — AB (ref 70–99)
GLUCOSE-CAPILLARY: 129 mg/dL — AB (ref 70–99)
GLUCOSE-CAPILLARY: 211 mg/dL — AB (ref 70–99)
Glucose-Capillary: 127 mg/dL — ABNORMAL HIGH (ref 70–99)

## 2014-03-25 LAB — PREGNANCY, URINE: PREG TEST UR: NEGATIVE

## 2014-03-25 SURGERY — REPAIR, HERNIA, UMBILICAL, ADULT
Anesthesia: General

## 2014-03-25 MED ORDER — KETOROLAC TROMETHAMINE 15 MG/ML IJ SOLN
15.0000 mg | Freq: Once | INTRAMUSCULAR | Status: AC
  Administered 2014-03-25: 15 mg via INTRAVENOUS
  Filled 2014-03-25: qty 1

## 2014-03-25 MED ORDER — METFORMIN HCL 500 MG PO TABS
1000.0000 mg | ORAL_TABLET | Freq: Two times a day (BID) | ORAL | Status: DC
  Administered 2014-03-25 – 2014-03-26 (×2): 1000 mg via ORAL
  Filled 2014-03-25 (×2): qty 2

## 2014-03-25 MED ORDER — LORAZEPAM 1 MG PO TABS: 1.0000 mg | ORAL_TABLET | Freq: Two times a day (BID) | ORAL | Status: DC | PRN

## 2014-03-25 MED ORDER — DOXYCYCLINE HYCLATE 100 MG IV SOLR
100.0000 mg | Freq: Two times a day (BID) | INTRAVENOUS | Status: DC
  Administered 2014-03-25: 100 mg via INTRAVENOUS
  Filled 2014-03-25: qty 100

## 2014-03-25 MED ORDER — PROPOFOL 10 MG/ML IV BOLUS
INTRAVENOUS | Status: AC
  Filled 2014-03-25: qty 20

## 2014-03-25 MED ORDER — FENTANYL CITRATE 0.05 MG/ML IJ SOLN
INTRAMUSCULAR | Status: AC
  Filled 2014-03-25: qty 2

## 2014-03-25 MED ORDER — KETOROLAC TROMETHAMINE 30 MG/ML IJ SOLN
15.0000 mg | Freq: Four times a day (QID) | INTRAMUSCULAR | Status: DC
Start: 2014-03-25 — End: 2014-03-26
  Administered 2014-03-25 – 2014-03-26 (×3): 15 mg via INTRAVENOUS
  Filled 2014-03-25 (×3): qty 1

## 2014-03-25 MED ORDER — GLYCOPYRROLATE 0.2 MG/ML IJ SOLN
INTRAMUSCULAR | Status: DC | PRN
  Administered 2014-03-25: 0.4 mg via INTRAVENOUS

## 2014-03-25 MED ORDER — SODIUM CHLORIDE 0.9 % IJ SOLN
INTRAMUSCULAR | Status: AC
  Filled 2014-03-25: qty 10

## 2014-03-25 MED ORDER — GABAPENTIN 100 MG PO CAPS: 100.0000 mg | ORAL_CAPSULE | Freq: Three times a day (TID) | ORAL | Status: DC

## 2014-03-25 MED ORDER — ONDANSETRON HCL 4 MG/2ML IJ SOLN: 4.0000 mg | Freq: Once | INTRAMUSCULAR | Status: DC | PRN

## 2014-03-25 MED ORDER — ROCURONIUM BROMIDE 50 MG/5ML IV SOLN
INTRAVENOUS | Status: AC
  Filled 2014-03-25: qty 1

## 2014-03-25 MED ORDER — 0.9 % SODIUM CHLORIDE (POUR BTL) OPTIME
TOPICAL | Status: DC | PRN
  Administered 2014-03-25: 1000 mL

## 2014-03-25 MED ORDER — POTASSIUM CHLORIDE IN NACL 20-0.9 MEQ/L-% IV SOLN
INTRAVENOUS | Status: DC
  Administered 2014-03-25 – 2014-03-26 (×2): via INTRAVENOUS

## 2014-03-25 MED ORDER — ONDANSETRON HCL 4 MG/2ML IJ SOLN
INTRAMUSCULAR | Status: AC
  Filled 2014-03-25: qty 2

## 2014-03-25 MED ORDER — ONDANSETRON HCL 4 MG PO TABS
4.0000 mg | ORAL_TABLET | Freq: Four times a day (QID) | ORAL | Status: DC | PRN
Start: 2014-03-25 — End: 2014-03-26

## 2014-03-25 MED ORDER — LACTATED RINGERS IV SOLN
INTRAVENOUS | Status: DC
  Administered 2014-03-25 (×2): via INTRAVENOUS

## 2014-03-25 MED ORDER — GABAPENTIN 100 MG PO CAPS
100.0000 mg | ORAL_CAPSULE | Freq: Two times a day (BID) | ORAL | Status: DC
  Administered 2014-03-25 – 2014-03-26 (×2): 100 mg via ORAL
  Filled 2014-03-25 (×2): qty 1

## 2014-03-25 MED ORDER — LIDOCAINE HCL (CARDIAC) 10 MG/ML IV SOLN
INTRAVENOUS | Status: DC | PRN
  Administered 2014-03-25: 10 mg via INTRAVENOUS

## 2014-03-25 MED ORDER — BUPIVACAINE HCL (PF) 0.5 % IJ SOLN
INTRAMUSCULAR | Status: DC | PRN
  Administered 2014-03-25: 8 mL

## 2014-03-25 MED ORDER — ONDANSETRON HCL 4 MG/2ML IJ SOLN: 4.0000 mg | Freq: Four times a day (QID) | INTRAMUSCULAR | Status: DC | PRN

## 2014-03-25 MED ORDER — BACITRACIN-NEOMYCIN-POLYMYXIN 400-5-5000 EX OINT
TOPICAL_OINTMENT | CUTANEOUS | Status: DC | PRN
  Administered 2014-03-25: 1 via TOPICAL

## 2014-03-25 MED ORDER — ROCURONIUM BROMIDE 100 MG/10ML IV SOLN
INTRAVENOUS | Status: DC | PRN
  Administered 2014-03-25 (×2): 5 mg via INTRAVENOUS
  Administered 2014-03-25: 30 mg via INTRAVENOUS

## 2014-03-25 MED ORDER — LIDOCAINE HCL (PF) 1 % IJ SOLN
INTRAMUSCULAR | Status: AC
  Filled 2014-03-25: qty 5

## 2014-03-25 MED ORDER — GLYCOPYRROLATE 0.2 MG/ML IJ SOLN
INTRAMUSCULAR | Status: AC
  Filled 2014-03-25: qty 2

## 2014-03-25 MED ORDER — FENTANYL CITRATE 0.05 MG/ML IJ SOLN
25.0000 ug | INTRAMUSCULAR | Status: DC | PRN
  Administered 2014-03-25 (×4): 50 ug via INTRAVENOUS

## 2014-03-25 MED ORDER — BACITRACIN 50000 UNITS IM SOLR
INTRAMUSCULAR | Status: AC
  Filled 2014-03-25: qty 1

## 2014-03-25 MED ORDER — ONDANSETRON HCL 4 MG/2ML IJ SOLN
4.0000 mg | Freq: Once | INTRAMUSCULAR | Status: AC
  Administered 2014-03-25: 4 mg via INTRAVENOUS

## 2014-03-25 MED ORDER — BUPIVACAINE HCL (PF) 0.5 % IJ SOLN
INTRAMUSCULAR | Status: AC
  Filled 2014-03-25: qty 30

## 2014-03-25 MED ORDER — FENTANYL CITRATE 0.05 MG/ML IJ SOLN
INTRAMUSCULAR | Status: DC | PRN
  Administered 2014-03-25: 100 ug via INTRAVENOUS
  Administered 2014-03-25 (×4): 25 ug via INTRAVENOUS

## 2014-03-25 MED ORDER — MIDAZOLAM HCL 2 MG/2ML IJ SOLN
INTRAMUSCULAR | Status: AC
  Filled 2014-03-25: qty 2

## 2014-03-25 MED ORDER — GABAPENTIN 100 MG PO CAPS
200.0000 mg | ORAL_CAPSULE | Freq: Every day | ORAL | Status: DC
  Administered 2014-03-25: 200 mg via ORAL
  Filled 2014-03-25: qty 2

## 2014-03-25 MED ORDER — BACITRACIN-NEOMYCIN-POLYMYXIN 400-5-5000 EX OINT
TOPICAL_OINTMENT | CUTANEOUS | Status: AC
  Filled 2014-03-25: qty 1

## 2014-03-25 MED ORDER — MIDAZOLAM HCL 2 MG/2ML IJ SOLN
1.0000 mg | INTRAMUSCULAR | Status: DC | PRN
  Administered 2014-03-25: 2 mg via INTRAVENOUS

## 2014-03-25 MED ORDER — INSULIN ASPART 100 UNIT/ML ~~LOC~~ SOLN
0.0000 [IU] | SUBCUTANEOUS | Status: DC
  Administered 2014-03-25: 7 [IU] via SUBCUTANEOUS
  Administered 2014-03-25: 3 [IU] via SUBCUTANEOUS
  Administered 2014-03-26 (×2): 4 [IU] via SUBCUTANEOUS

## 2014-03-25 MED ORDER — GLYCOPYRROLATE 0.2 MG/ML IJ SOLN
INTRAMUSCULAR | Status: AC
  Filled 2014-03-25: qty 3

## 2014-03-25 MED ORDER — NEOSTIGMINE METHYLSULFATE 1 MG/ML IJ SOLN
INTRAMUSCULAR | Status: DC | PRN
  Administered 2014-03-25: 2 mg via INTRAVENOUS

## 2014-03-25 MED ORDER — DOCUSATE SODIUM 100 MG PO CAPS
100.0000 mg | ORAL_CAPSULE | Freq: Two times a day (BID) | ORAL | Status: DC
  Administered 2014-03-25 – 2014-03-26 (×2): 100 mg via ORAL
  Filled 2014-03-25 (×2): qty 1

## 2014-03-25 MED ORDER — PROPOFOL 10 MG/ML IV BOLUS
INTRAVENOUS | Status: DC | PRN
  Administered 2014-03-25: 200 mg via INTRAVENOUS
  Administered 2014-03-25: 50 mg via INTRAVENOUS

## 2014-03-25 SURGICAL SUPPLY — 50 items
ATTRACTOMAT 16X20 MAGNETIC DRP (DRAPES) ×2 IMPLANT
BAG HAMPER (MISCELLANEOUS) ×2 IMPLANT
CLEANER TIP ELECTROSURG 2X2 (MISCELLANEOUS) ×2 IMPLANT
CLOTH BEACON ORANGE TIMEOUT ST (SAFETY) ×2 IMPLANT
COVER LIGHT HANDLE STERIS (MISCELLANEOUS) ×4 IMPLANT
DECANTER SPIKE VIAL GLASS SM (MISCELLANEOUS) ×2 IMPLANT
DURAPREP 26ML APPLICATOR (WOUND CARE) ×1 IMPLANT
ELECT REM PT RETURN 9FT ADLT (ELECTROSURGICAL) ×2
ELECTRODE REM PT RTRN 9FT ADLT (ELECTROSURGICAL) ×1 IMPLANT
FORMALIN 10 PREFIL 120ML (MISCELLANEOUS) ×2 IMPLANT
GLOVE BIOGEL PI IND STRL 7.0 (GLOVE) IMPLANT
GLOVE BIOGEL PI IND STRL 7.5 (GLOVE) IMPLANT
GLOVE BIOGEL PI IND STRL 8.5 (GLOVE) IMPLANT
GLOVE BIOGEL PI INDICATOR 7.0 (GLOVE) ×2
GLOVE BIOGEL PI INDICATOR 7.5 (GLOVE) ×1
GLOVE BIOGEL PI INDICATOR 8.5 (GLOVE) ×1
GLOVE ECLIPSE 7.0 STRL STRAW (GLOVE) ×2 IMPLANT
GLOVE ECLIPSE 8.5 STRL (GLOVE) ×1 IMPLANT
GLOVE SKINSENSE NS SZ7.0 (GLOVE) ×1
GLOVE SKINSENSE STRL SZ7.0 (GLOVE) ×1 IMPLANT
GOWN STRL REUS W/TWL LRG LVL3 (GOWN DISPOSABLE) ×6 IMPLANT
INST SET MINOR GENERAL (KITS) ×2 IMPLANT
KIT ROOM TURNOVER APOR (KITS) ×2 IMPLANT
MANIFOLD NEPTUNE II (INSTRUMENTS) ×2 IMPLANT
NS IRRIG 1000ML POUR BTL (IV SOLUTION) ×2 IMPLANT
PACK MINOR (CUSTOM PROCEDURE TRAY) ×2 IMPLANT
PAD ARMBOARD 7.5X6 YLW CONV (MISCELLANEOUS) ×2 IMPLANT
SET BASIN LINEN APH (SET/KITS/TRAYS/PACK) ×2 IMPLANT
SOL PREP PROV IODINE SCRUB 4OZ (MISCELLANEOUS) ×1 IMPLANT
SPONGE GAUZE 4X4 12PLY (GAUZE/BANDAGES/DRESSINGS) ×2 IMPLANT
SPONGE INTESTINAL PEANUT (DISPOSABLE) ×2 IMPLANT
SPONGE LAP 18X18 X RAY DECT (DISPOSABLE) ×2 IMPLANT
STAPLER VISISTAT 35W (STAPLE) ×2 IMPLANT
SUT PROLENE 0 CT 1 CR/8 (SUTURE) IMPLANT
SUT PROLENE 1 CT 1 30 (SUTURE) IMPLANT
SUT PROLENE 4 0 PS 2 18 (SUTURE) IMPLANT
SUT PROLENE MO 6 BLUE #0 30IN (SUTURE) ×2 IMPLANT
SUT SILK 2 0 (SUTURE) ×2
SUT SILK 2-0 18XBRD TIE 12 (SUTURE) ×1 IMPLANT
SUT VIC AB 3-0 SH 27 (SUTURE) ×2
SUT VIC AB 3-0 SH 27X BRD (SUTURE) ×1 IMPLANT
SUT VIC AB 5-0 P-3 18X BRD (SUTURE) IMPLANT
SUT VIC AB 5-0 P3 18 (SUTURE)
SUT VICRYL AB 3 0 TIES (SUTURE) ×2 IMPLANT
SUT VICRYL AB 3-0 BRD CT 36IN (SUTURE) ×2 IMPLANT
SYR BULB IRRIGATION 50ML (SYRINGE) ×2 IMPLANT
SYR CONTROL 10ML LL (SYRINGE) ×2 IMPLANT
TAPE PAPER 3X10 WHT MICROPORE (GAUZE/BANDAGES/DRESSINGS) ×1 IMPLANT
TRAY FOLEY CATH 16FR SILVER (SET/KITS/TRAYS/PACK) IMPLANT
WATER STERILE IRR 1000ML POUR (IV SOLUTION) ×4 IMPLANT

## 2014-03-25 NOTE — Anesthesia Postprocedure Evaluation (Signed)
  Anesthesia Post-op Note  Patient: [name]  Procedure(s) Performed: Procedure(s): UMBILICAL HERNIA REPAIR (N/A)  Patient Location: PACU  Anesthesia Type:General  Level of Consciousness: awake and patient cooperative  Airway and Oxygen Therapy: Patient Spontanous Breathing and Patient connected to nasal cannula oxygen  Post-op Pain: moderate  Post-op Assessment: Post-op Vital signs reviewed, Patient's Cardiovascular Status Stable, Respiratory Function Stable, Patent Airway and No signs of Nausea or vomiting  Post-op Vital Signs: Reviewed and stable  Last Vitals:  Filed Vitals:   03/25/14 1434  BP: 133/79  Pulse: 76  Temp: 37.3 C  Resp: 18    Complications: No apparent anesthesia complications

## 2014-03-25 NOTE — Progress Notes (Signed)
48 yr old W. Female for elective repair of umbilical hernia.  Procedure and risks explained and informed consent obtained No clinical change since H&P.  (Pt.'s problems with folliculitis have resolved.)   Glucose 122  There were no vitals filed for this visit. Temp 98.8, HR 95/min, resp. 17, BP 129/65   O2 sat 96%

## 2014-03-25 NOTE — Brief Op Note (Signed)
03/25/2014  2:35 PM  PATIENT:  [name]  48 y.o. female  PRE-OPERATIVE DIAGNOSIS:  umbilical hernia  POST-OPERATIVE DIAGNOSIS:  umbilical hernia  PROCEDURE:  Procedure(s): HERNIA REPAIR UMBILICAL ADULT (N/A)  SURGEON:  Surgeon(s) and Role:    * Marlane Hatcher, MD - Primary  PHYSICIAN ASSISTANT:   ASSISTANTS: none   ANESTHESIA:   general  EBL:  Total I/O In: 1200 [I.V.:1200] Out: 30 [Blood:30]  BLOOD ADMINISTERED:none  DRAINS: none   LOCAL MEDICATIONS USED:  MARCAINE   0.5%  ~ 8cc.  SPECIMEN:  Source of Specimen:  incarcerated omentum and umbilical hernia sac.  DISPOSITION OF SPECIMEN:  PATHOLOGY  COUNTS:  YES  TOURNIQUET:  * No tourniquets in log *  DICTATION: .Other Dictation: Dictation Number OR dict. # [id number].  PLAN OF CARE: Admit for overnight observation  PATIENT DISPOSITION:  PACU - hemodynamically stable.   Delay start of Pharmacological VTE agent (>24hrs) due to surgical blood loss or risk of bleeding: yes

## 2014-03-25 NOTE — Addendum Note (Signed)
Addendum created 03/25/14 1440 by Franco Nones, CRNA   Modules edited: Charges VN

## 2014-03-25 NOTE — Progress Notes (Signed)
Note H&P re dictated.  Dict# [id number].

## 2014-03-25 NOTE — Transfer of Care (Signed)
Immediate Anesthesia Transfer of Care Note  Patient: [name]  Procedure(s) Performed: Procedure(s): UMBILICAL HERNIA REPAIR (N/A)  Patient Location: PACU  Anesthesia Type:General  Level of Consciousness: sedated and patient cooperative  Airway & Oxygen Therapy: Patient Spontanous Breathing and Patient connected to nasal cannula oxygen  Post-op Assessment: Report given to PACU RN, Post -op Vital signs reviewed and stable and Patient moving all extremities  Post vital signs: Reviewed and stable  Complications: No apparent anesthesia complications

## 2014-03-25 NOTE — Anesthesia Procedure Notes (Signed)
Procedure Name: Intubation Date/Time: 03/25/2014 1:22 PM Performed by: Franco Nones Pre-anesthesia Checklist: Patient identified, Patient being monitored, Timeout performed, Emergency Drugs available and Suction available Patient Re-evaluated:Patient Re-evaluated prior to inductionOxygen Delivery Method: Circle System Utilized Preoxygenation: Pre-oxygenation with 100% oxygen Intubation Type: IV induction Ventilation: Mask ventilation without difficulty Laryngoscope Size: Miller and 2 Grade View: Grade I Tube type: Oral Tube size: 7.0 mm Number of attempts: 1 Airway Equipment and Method: stylet Placement Confirmation: ETT inserted through vocal cords under direct vision,  positive ETCO2 and breath sounds checked- equal and bilateral Secured at: 21 cm Tube secured with: Tape Dental Injury: Teeth and Oropharynx as per pre-operative assessment

## 2014-03-25 NOTE — Anesthesia Preprocedure Evaluation (Signed)
Anesthesia Evaluation  Patient identified by MRN, date of birth, ID band Patient awake    Reviewed: Allergy & Precautions, H&P , NPO status , Patient's Chart, lab work & pertinent test results  Airway Mallampati: II TM Distance: >3 FB Neck ROM: Full    Dental no notable dental hx. (+) Teeth Intact   Pulmonary shortness of breath and with exertion,  breath sounds clear to auscultation  Pulmonary exam normal       Cardiovascular hypertension, Pt. on medications negative cardio ROS  Rhythm:Regular Rate:Normal     Neuro/Psych  Headaches, PSYCHIATRIC DISORDERS Depression  Neuromuscular disease    GI/Hepatic negative GI ROS, Neg liver ROS,   Endo/Other  diabetes, Poorly Controlled, Type 2, Insulin DependentMorbid obesity  Renal/GU Renal diseasenegative Renal ROS     Musculoskeletal negative musculoskeletal ROS (+)   Abdominal (+) + obese,  Abdomen: soft.    Peds  Hematology negative hematology ROS (+)   Anesthesia Other Findings   Reproductive/Obstetrics negative OB ROS                          Anesthesia Physical Anesthesia Plan  ASA: III  Anesthesia Plan: General   Post-op Pain Management:    Induction: Intravenous  Airway Management Planned: Oral ETT  Additional Equipment:   Intra-op Plan:   Post-operative Plan: Extubation in OR  Informed Consent: I have reviewed the patients History and Physical, chart, labs and discussed the procedure including the risks, benefits and alternatives for the proposed anesthesia with the patient or authorized representative who has indicated his/her understanding and acceptance.     Plan Discussed with:   Anesthesia Plan Comments:         Anesthesia Quick Evaluation  

## 2014-03-25 NOTE — Progress Notes (Signed)
Post Op Check  Awake and alert.  Dressings dry and in tact.BS 122.  Will place on sliding scale.   Has not voided.  129/69, HR 87, O2 sat 99% RA, temp 98.2.

## 2014-03-26 LAB — GLUCOSE, CAPILLARY
GLUCOSE-CAPILLARY: 151 mg/dL — AB (ref 70–99)
GLUCOSE-CAPILLARY: 176 mg/dL — AB (ref 70–99)

## 2014-03-26 MED ORDER — DSS 100 MG PO CAPS: 100.0000 mg | ORAL_CAPSULE | Freq: Two times a day (BID) | ORAL | Status: DC

## 2014-03-26 MED ORDER — ACETAMINOPHEN 500 MG PO TABS
500.0000 mg | ORAL_TABLET | ORAL | Status: DC | PRN
  Administered 2014-03-26 (×2): 500 mg via ORAL
  Filled 2014-03-26 (×2): qty 1

## 2014-03-26 MED ORDER — HYDROCODONE-ACETAMINOPHEN 5-325 MG PO TABS
1.0000 | ORAL_TABLET | Freq: Four times a day (QID) | ORAL | Status: DC | PRN
  Administered 2014-03-26: 1 via ORAL
  Filled 2014-03-26: qty 1

## 2014-03-26 NOTE — H&P (Signed)
[name], [name] NO.:  [id number]  MEDICAL RECORD NO.:  [id number]  LOCATION:  A306                          FACILITY:  [facility]  PHYSICIAN:  Barbaraann Barthel, M.D. DATE OF BIRTH:  1966-04-20  DATE OF ADMISSION:  03/25/2014 DATE OF DISCHARGE:  LH                             HISTORY & PHYSICAL   NOTE:  This is a 48 year old white, obese, insulin-dependent diabetic patient who was scheduled for surgery back in April.  She had some folliculitis, which caused [place] to cancel the case at that time, this was treated and she has now been readmitted via the outpatient department for elective umbilical hernia repair.  Preoperatively, her blood sugar was 122.  PAST MEDICAL HISTORY:  She is type 1 diabetic.  She uses an insulin pump.  PAST SURGICAL HISTORY:  Past surgeries have included C-sections x2 in 1994 and 2000.  She has had sinus surgery in 1997.  She had uterine ablation per seizure in 2013, and she had gallbladder surgery in 2005.  ALLERGIES:  She is allergic to CODEINE, MORPHINE and SULFA.  SOCIAL HISTORY:  She is a nonsmoker and nondrinker.  For her medications, consult medication sheet.  PHYSICAL EXAMINATION:  VITAL SIGNS:  She is 5 feet and 9 inches tall, she weighs 202 pounds, her temperature is 98.8, heart rate is 95, respirations 17, blood pressure 129/65 and her O2 sat on room air is 96%. HEENT:  Her head is normocephalic.  Her eyes; extraocular movements are intact.  Her pupils are round and reactive to light and accommodation. There is no icteric tincture and conjunctiva is a normal tincture.  No bruits are appreciated.  No jugular vein distention, thyromegaly, tracheal deviation, or cervical adenopathy. CHEST:  Clear, both anterior and posterior auscultation. HEART:  Regular rhythm. BREASTS:  Breasts and axillae are without masses. ABDOMEN:  Soft.  No visceromegaly is appreciated.  The patient has reducible umbilical hernia.  No inguinal hernias  are appreciated. EXTREMITIES:  She is status post treatment for foot infection back in April and there is some scabs from this erysipelas type of infection that she had. GENITOURINARY:  Genitalia area, she no longer has the folliculitis that was bothering her last time that we had to cancel the surgery.  REVIEW OF SYSTEMS:  NEUROLOGIC:  There are no lateralizing neurological findings.  No history of migraines or seizures.  ENDOCRINE.  The patient is a type 1 diabetic who uses the pump and she has no history of thyroid disease or adrenal problems.  CARDIOPULMONARY:  Grossly within normal limits.  MUSCULOSKELETAL:  The patient is obese.  OB/GYN:  She is a gravida 2, para 0, Cesarean 2, abortus 0 female with no family history of breast carcinoma.  She had uterine ablation done in 2013.  Her last mammogram was in 2014.  GI:  No history of hepatitis, constipation, diarrhea, bright red rectal bleeding, or melena.  No history of inflammatory bowel disease or irritable bowel syndrome.  No history of unexplained weight loss.  The patient has never had a colonoscopy.  GU: No history of dysuria or frequency.  She does have a past history of kidney stones.  We  will plan for elective repair of umbilical hernia via the outpatient department, to be discussed complications, not limited to, but including bleeding, infection, and recurrence, and the possibility that a mesh prosthesis might be required.  Informed consent was obtained.     Barbaraann Barthel, M.D.     WB/MEDQ  D:  03/25/2014  T:  03/26/2014  Job:  [id number]  cc:   Catalina Pizza, M.D. Fax: [phone]

## 2014-03-26 NOTE — Progress Notes (Signed)
Dr. Malvin Johns was notified of the patients pain not being managed with her current pain regimen.  We discussed the patients home med and the fact that she was on Vicodin at home, but according to her allergies she has codiene listed.  He told me to double check the Vicodin to be sure that it is okay for her to take, and if so order her the dose she takes at home.  Discussed the above with the patient and she states she does take vicodin at home and she does not have intolerance to the med.  So new orders given and followed.

## 2014-03-26 NOTE — Progress Notes (Signed)
Inpatient Diabetes Program Recommendations  AACE/ADA: New Consensus Statement on Inpatient Glycemic Control (2013)  Target Ranges:  Prepandial:   less than 140 mg/dL      Peak postprandial:   less than 180 mg/dL (1-2 hours)      Critically ill patients:  140 - 180 mg/dL   No orders for pt to continue insulin pump, but resistant correction is being used tidwc and Metformin. Pt may need some lantus to cover the basal needs normally delivered via her pump.  Glucose controlled at this point. Should her cbgs's rise in presence of resistant correction. Please add some lantus 20 units as safe to cover partially basal needs. Assume pt would be discharged soon if without post-op complications.Thank you, Lenor Coffin, RN, CNS, Diabetes Coordinator [phone])

## 2014-03-26 NOTE — Addendum Note (Signed)
Addendum created 03/26/14 1344 by Moshe Salisbury, CRNA   Modules edited: Charges VN, Notes Section   Notes Section:  File: [id number]

## 2014-03-26 NOTE — Progress Notes (Signed)
UR chart review completed.  

## 2014-03-26 NOTE — Op Note (Signed)
[name], [name] NO.:  [id number]  MEDICAL RECORD NO.:  [id number]  LOCATION:                                 FACILITY:  PHYSICIAN:  Barbaraann Barthel, M.D. DATE OF BIRTH:  March 03, 1966  DATE OF PROCEDURE:  03/25/2014 DATE OF DISCHARGE:                              OPERATIVE REPORT   DIAGNOSIS:  Umbilical hernia.  PROCEDURE:  Umbilical herniorrhaphy without mesh.  SPECIMEN:  Hernia sac and incarcerated omentum.  GROSS OPERATIVE FINDINGS:  The patient had probably a 50 cent sized umbilical hernia with incarcerated omentum within it.  No other abnormalities were encountered.  Wound classification clean.  NOTE:  This is a 48 year old, insulin-dependent obese female who had an umbilical hernia that had been present for at least 10 years.  We had planned to operate on her via the outpatient department earlier, however, she presented to the outpatient department with folliculitis. This was treated with antibiotics and resolved, and we rescheduled her hernia repair.  Preoperatively, her blood sugar was 122.  GROSS OPERATIVE FINDINGS:  As mentioned above, small incarcerated umbilical hernia.  TECHNIQUE:  The patient was placed in the supine position and after the adequate administration of general anesthesia, she was prepped with alcohol based antiseptic and then draped in the usual manner.  A periumbilical incision was carried out in a curvilinear manner below the umbilicus through skin, subcutaneous tissue, down to the fascia.  The umbilical hernia sac was dissected free from the umbilicus and we ligated and clamped the incarcerated omentum that was present there and resected this and sent this with the specimen along with a hernia sac. We then closed the defect using interrupted 0 Prolene sutures in a figure-of-eight fashion.  We closed the subcutaneous tissue over this closure and prior to that, we used approximately 8 mL of Marcaine to help with  postoperative comfort.  The wound was copiously irrigated and the skin was approximated with stapling device, sterile dressing with Neosporin and 4x4s was placed.  Prior to closure, all sponge, needle, and instrument counts were found to be correct.  Estimated blood loss was less than 25 mL.  The patient was replaced with approximately 1100 mL of crystalloids intraoperatively.  There were no complications.     Barbaraann Barthel, M.D.     WB/MEDQ  D:  03/25/2014  T:  03/26/2014  Job:  [id number]  cc:   Catalina Pizza, M.D. Fax: [phone]

## 2014-03-26 NOTE — Anesthesia Postprocedure Evaluation (Signed)
  Anesthesia Post-op Note  Patient: [name]  Procedure(s) Performed: Procedure(s): UMBILICAL HERNIA REPAIR (N/A)  Patient Location: Nursing Unit  Anesthesia Type:General  Level of Consciousness: awake, alert  and oriented  Airway and Oxygen Therapy: Patient Spontanous Breathing  Post-op Pain: none  Post-op Assessment: Post-op Vital signs reviewed, Patient's Cardiovascular Status Stable, Respiratory Function Stable, Patent Airway and No signs of Nausea or vomiting  Post-op Vital Signs: Reviewed and stable  Last Vitals:  Filed Vitals:   03/26/14 0402  BP: 99/53  Pulse: 84  Temp: 37.3 C  Resp: 20    Complications: No apparent anesthesia complications

## 2014-03-26 NOTE — Progress Notes (Signed)
POD #1  Filed Vitals:   03/26/14 0402  BP: 99/53  Pulse: 84  Temp: 99.1 F (37.3 C)  Resp: 20   Abdomen soft,  Tolerating PPO well..  Incisional pain only which is expected.  Wound is very clean and was redressed.  Pt voiding well and has no leg pain, shortness of breath or chest pain.  Blood sugars have been well controled on sliding scale.   Discharge and follow up arranged, dict. Note # [id number].

## 2014-03-27 NOTE — Discharge Summary (Signed)
[name], [name] NO.:  [id number]  MEDICAL RECORD NO.:  [id number]  LOCATION:  A306                          FACILITY:  [facility]  PHYSICIAN:  Barbaraann Barthel, M.D. DATE OF BIRTH:  1966/03/02  DATE OF ADMISSION:  03/25/2014 DATE OF DISCHARGE:  04/17/2015LH                              DISCHARGE SUMMARY   PROCEDURE:  Umbilical herniorrhaphy (without mesh) on March 25, 2014.  SECONDARY DIAGNOSES: 1. Type 1 diabetes. 2. Obesity.  NOTE:  This is a 48 year old obese, type 1 diabetic, who was referred for an umbilical hernia repair.  This was planned earlier; however, we had to postpone this as she had a bout of folliculitis.  This was resolved and we admitted her via the outpatient department on March 25, 2014, at which time, an uneventful umbilical herniorrhaphy was carried out.  Perioperatively, her blood sugars were controlled with a sliding scale and her diet and activity were advanced as tolerated and she did well and she was discharged on the first postoperative day.  At the time of discharge, she was voiding without dysuria, her wound was clean.  She had no shortness of breath, leg pain, or chest pain, and she was doing well.  Discharge and followup instructions were explained and she is told to resume her insulin pump and to follow up with Dr. Catalina Pizza for her medical problems after surgical release.     Barbaraann Barthel, M.D.     WB/MEDQ  D:  03/26/2014  T:  03/27/2014  Job:  [id number]  cc:   Catalina Pizza, M.D. Fax: [phone]

## 2014-03-29 ENCOUNTER — Emergency Department (HOSPITAL_COMMUNITY)
Admission: EM | Admit: 2014-03-29 | Discharge: 2014-03-29 | Disposition: A | Discharge status: Home / Self Care | Payer: Managed Care, Other (non HMO) | Attending: Emergency Medicine | Admitting: Emergency Medicine

## 2014-03-29 ENCOUNTER — Encounter (HOSPITAL_COMMUNITY): Payer: Self-pay | Admitting: Emergency Medicine

## 2014-03-29 ENCOUNTER — Emergency Department (HOSPITAL_COMMUNITY): Payer: Managed Care, Other (non HMO)

## 2014-03-29 DIAGNOSIS — Z9089 Acquired absence of other organs: Secondary | ICD-10-CM | POA: Insufficient documentation

## 2014-03-29 DIAGNOSIS — Z79899 Other long term (current) drug therapy: Secondary | ICD-10-CM | POA: Insufficient documentation

## 2014-03-29 DIAGNOSIS — Z8669 Personal history of other diseases of the nervous system and sense organs: Secondary | ICD-10-CM | POA: Insufficient documentation

## 2014-03-29 DIAGNOSIS — L03319 Cellulitis of trunk, unspecified: Secondary | ICD-10-CM

## 2014-03-29 DIAGNOSIS — L02219 Cutaneous abscess of trunk, unspecified: Secondary | ICD-10-CM | POA: Insufficient documentation

## 2014-03-29 DIAGNOSIS — Z880 Allergy status to penicillin: Secondary | ICD-10-CM | POA: Insufficient documentation

## 2014-03-29 DIAGNOSIS — L0390 Cellulitis, unspecified: Secondary | ICD-10-CM

## 2014-03-29 DIAGNOSIS — I10 Essential (primary) hypertension: Secondary | ICD-10-CM | POA: Insufficient documentation

## 2014-03-29 DIAGNOSIS — Y838 Other surgical procedures as the cause of abnormal reaction of the patient, or of later complication, without mention of misadventure at the time of the procedure: Secondary | ICD-10-CM | POA: Insufficient documentation

## 2014-03-29 DIAGNOSIS — S301XXA Contusion of abdominal wall, initial encounter: Secondary | ICD-10-CM | POA: Insufficient documentation

## 2014-03-29 DIAGNOSIS — Z87442 Personal history of urinary calculi: Secondary | ICD-10-CM | POA: Insufficient documentation

## 2014-03-29 DIAGNOSIS — E119 Type 2 diabetes mellitus without complications: Secondary | ICD-10-CM | POA: Insufficient documentation

## 2014-03-29 DIAGNOSIS — Z794 Long term (current) use of insulin: Secondary | ICD-10-CM | POA: Insufficient documentation

## 2014-03-29 LAB — CBC WITH DIFFERENTIAL/PLATELET
BASOS ABS: 0 10*3/uL (ref 0.0–0.1)
BASOS PCT: 0 % (ref 0–1)
EOS ABS: 0.5 10*3/uL (ref 0.0–0.7)
Eosinophils Relative: 6 % — ABNORMAL HIGH (ref 0–5)
HEMATOCRIT: 40.2 % (ref 36.0–46.0)
Hemoglobin: 13.5 g/dL (ref 12.0–15.0)
Lymphocytes Relative: 21 % (ref 12–46)
Lymphs Abs: 1.7 10*3/uL (ref 0.7–4.0)
MCH: 28.9 pg (ref 26.0–34.0)
MCHC: 33.6 g/dL (ref 30.0–36.0)
MCV: 86.1 fL (ref 78.0–100.0)
MONO ABS: 0.6 10*3/uL (ref 0.1–1.0)
Monocytes Relative: 7 % (ref 3–12)
Neutro Abs: 5.5 10*3/uL (ref 1.7–7.7)
Neutrophils Relative %: 66 % (ref 43–77)
Platelets: 308 10*3/uL (ref 150–400)
RBC: 4.67 MIL/uL (ref 3.87–5.11)
RDW: 13.5 % (ref 11.5–15.5)
WBC: 8.3 10*3/uL (ref 4.0–10.5)

## 2014-03-29 LAB — COMPREHENSIVE METABOLIC PANEL
ALT: 29 U/L (ref 0–35)
AST: 20 U/L (ref 0–37)
Albumin: 3.1 g/dL — ABNORMAL LOW (ref 3.5–5.2)
Alkaline Phosphatase: 115 U/L (ref 39–117)
BUN: 8 mg/dL (ref 6–23)
CALCIUM: 9 mg/dL (ref 8.4–10.5)
CO2: 25 mEq/L (ref 19–32)
CREATININE: 0.57 mg/dL (ref 0.50–1.10)
Chloride: 99 mEq/L (ref 96–112)
GFR calc Af Amer: >90 mL/min (ref >90)
GFR calc non Af Amer: >90 mL/min (ref >90)
Glucose, Bld: 235 mg/dL — ABNORMAL HIGH (ref 70–99)
Potassium: 3.9 mEq/L (ref 3.7–5.3)
Sodium: 137 mEq/L (ref 137–147)
Total Bilirubin: 0.5 mg/dL (ref 0.3–1.2)
Total Protein: 7.5 g/dL (ref 6.0–8.3)

## 2014-03-29 LAB — GLUCOSE, CAPILLARY: Glucose-Capillary: 124 mg/dL — ABNORMAL HIGH (ref 70–99)

## 2014-03-29 MED ORDER — ONDANSETRON HCL 4 MG/2ML IJ SOLN
4.0000 mg | Freq: Once | INTRAMUSCULAR | Status: AC
  Administered 2014-03-29: 4 mg via INTRAVENOUS
  Filled 2014-03-29: qty 2

## 2014-03-29 MED ORDER — HYDROMORPHONE HCL PF 1 MG/ML IJ SOLN
1.0000 mg | Freq: Once | INTRAMUSCULAR | Status: AC
  Administered 2014-03-29: 1 mg via INTRAVENOUS
  Filled 2014-03-29: qty 1

## 2014-03-29 MED ORDER — DOXYCYCLINE HYCLATE 100 MG PO CAPS: 100.0000 mg | ORAL_CAPSULE | Freq: Two times a day (BID) | ORAL | Status: DC

## 2014-03-29 MED ORDER — IOHEXOL 300 MG/ML  SOLN
100.0000 mL | Freq: Once | INTRAMUSCULAR | Status: AC | PRN
  Administered 2014-03-29: 100 mL via INTRAVENOUS

## 2014-03-29 NOTE — Discharge Instructions (Signed)
Follow up Friday as planned with your surgeon

## 2014-03-29 NOTE — ED Provider Notes (Signed)
CSN: [id number]     Arrival date & time 03/29/14  2563 History  This chart was scribed for Benny Lennert, MD by [name] Milch, ED Scribe. This patient was seen in room APA02/APA02 and the patient's care was started at 10:02 AM.    Chief Complaint  Patient presents with  . Post-op Problem    Patient is a 48 y.o. female presenting with abdominal pain. The history is provided by the patient. No language interpreter was used.  Abdominal Pain Pain quality: aching and burning   Pain radiates to:  Periumbilical region Pain severity:  Moderate Onset quality:  Sudden Timing:  Constant Progression:  Worsening Chronicity:  New Context: previous surgery (Pt reports having surgery for her hernia 3 days ago. )   Context: not medication withdrawal and not recent travel   Relieved by:  OTC medications Worsened by:  Movement, palpation and position changes Ineffective treatments:  OTC medications Associated symptoms: no chest pain, no cough, no diarrhea, no fatigue and no hematuria   She states she fell this morning as post surgery pain worsened. She reports that her 40 lb dog jumped on her yesterday and she is concerned he may have disrupted the hernia repair. She states her staples are to come out in 4 days.  Past Medical History  Diagnosis Date  . Diabetes mellitus     insulin pump 2013  . Kidney stone   . Neuropathy   . Hypertension   . Hypercholesteremia   . Shortness of breath    Past Surgical History  Procedure Laterality Date  . Cesarean section  1994;2000    [place]  . Cholecystectomy  1992  . Sinus sergery  1988    [place]  . Foot surgery Right March, 2013    [facility]-removal of bone spur  . Hysteroscopy with thermachoice  04/25/2012    Procedure: HYSTEROSCOPY WITH THERMACHOICE;  Surgeon: Lazaro Arms, MD;  Location: [facility];  Service: Gynecology;  Laterality: N/A;  total therapy time= 11 minutes 42 seconds; 34 ml D5w  in and 34 ml D5w out; temp =87 degrees F   . Cystoscopy w/ ureteral stent placement  05/08/2012    Procedure: CYSTOSCOPY WITH RETROGRADE PYELOGRAM/URETERAL STENT PLACEMENT;  Surgeon: Ky Barban, MD;  Location: [facility];  Service: Urology;  Laterality: Right;   Family History  Problem Relation Age of Onset  . Heart disease    . Cancer    . Diabetes    . Achalasia    . Anesthesia problems Neg Hx   . Hypotension Neg Hx   . Malignant hyperthermia Neg Hx   . Pseudochol deficiency Neg Hx   . Diabetes Mother   . Depression Paternal Grandmother   . Depression Paternal Grandfather    History  Substance Use Topics  . Smoking status: Never Smoker   . Smokeless tobacco: Never Used  . Alcohol Use: No   OB History   Grav Para Term Preterm Abortions TAB SAB Ect Mult Living                 Review of Systems  Constitutional: Negative for appetite change and fatigue.  HENT: Negative for congestion, ear discharge and sinus pressure.   Eyes: Negative for discharge.  Respiratory: Negative for cough.   Cardiovascular: Negative for chest pain.  Gastrointestinal: Positive for abdominal pain. Negative for diarrhea.  Genitourinary: Negative for frequency and hematuria.  Musculoskeletal: Negative for back pain.  Skin: Negative for rash.  Neurological: Negative  for seizures and headaches.  Psychiatric/Behavioral: Negative for hallucinations.      Allergies  Ciprofloxacin; Penicillins; Codeine; Morphine; Sulfonamide derivatives; and Vancomycin  Home Medications   Prior to Admission medications   Medication Sig Start Date End Date Taking? Authorizing Provider  docusate sodium 100 MG CAPS Take 100 mg by mouth 2 (two) times daily. 03/26/14   Marlane Hatcher, MD  gabapentin (NEURONTIN) 100 MG capsule Take 100-200 mg by mouth 3 (three) times daily. Takes one capsule in the morning and one capsule in the afternoon, then takes two capsules at bedtime 10/19/13   Carlus Pavlov, MD  glucagon [facility]) 1 MG injection Inject  in the muscle as needed for hypoglycemia 12/15/12   Carlus Pavlov, MD  Insulin Human (INSULIN PUMP) SOLN Inject into the skin continuous. insulin lispro (HUMALOG) 100 UNIT/ML injection    Historical Provider, MD  LORazepam (ATIVAN) 1 MG tablet Take 1 tablet (1 mg total) by mouth 2 (two) times daily as needed (dizziness). 02/28/14   Donnetta Hutching, MD  metFORMIN (GLUCOPHAGE) 500 MG tablet Take 1,000 mg by mouth 2 (two) times daily with a meal.     Historical Provider, MD  nitroGLYCERIN (NITROSTAT) 0.4 MG SL tablet Place 1 tablet (0.4 mg total) under the tongue every 5 (five) minutes as needed for chest pain. Go to ER if 2nd dose needed 11/24/12 03/22/14  Salley Scarlet, MD  triamcinolone cream (KENALOG) 0.1 % Apply 1 application topically daily as needed (rash).  02/16/14   Historical Provider, MD   Triage Vitals: BP 126/71  Pulse 89  Temp(Src) 98.1 F (36.7 C) (Oral)  Resp 17  Ht 4\' 8"  (1.422 m)  SpO2 96%  Physical Exam  Nursing note and vitals reviewed. Constitutional: She is oriented to person, place, and time. She appears well-developed.  HENT:  Head: Normocephalic.  Eyes: Conjunctivae and EOM are normal. No scleral icterus.  Neck: Neck supple. No thyromegaly present.  Cardiovascular: Normal rate and regular rhythm.  Exam reveals no gallop and no friction rub.   No murmur heard. Pulmonary/Chest: No stridor. She has no wheezes. She has no rales. She exhibits no tenderness.  Abdominal: She exhibits no distension. There is tenderness (moderate tenderness over umbilicus where she had her incision for surgery.). There is no rebound.  Musculoskeletal: Normal range of motion. She exhibits no edema.  Lymphadenopathy:    She has no cervical adenopathy.  Neurological: She is oriented to person, place, and time. She exhibits normal muscle tone. Coordination normal.  Skin: No rash noted. No erythema.  Psychiatric: She has a normal mood and affect. Her behavior is normal.    ED Course   Procedures (including critical care time)  DIAGNOSTIC STUDIES: Oxygen Saturation is 96% on RA, adequate by my interpretation.    COORDINATION OF CARE: 10:04 AM- Pt advised of plan for treatment and pt agrees.    Labs Review Labs Reviewed - No data to display  Imaging Review No results found.   EKG Interpretation None      MDM   Final diagnoses:  None    Ct shows possible cellulitis.  Will tx with doxy and have pt follow up with surgeon this week as planned The chart was scribed for me under my direct supervision.  I personally performed the history, physical, and medical decision making and all procedures in the evaluation of this patient. , MD 03/29/14 [phone]

## 2014-03-29 NOTE — ED Notes (Signed)
Pt had umbilical hernia repair on Thursday. States her dog jumped on her yesterday and knocked her down. Pt states burning pain to abdomen since. Surgeon is Dr. Malvin Johns

## 2014-05-03 ENCOUNTER — Emergency Department (HOSPITAL_COMMUNITY)
Admission: EM | Admit: 2014-05-03 | Discharge: 2014-05-03 | Disposition: A | Discharge status: Home / Self Care | Payer: Managed Care, Other (non HMO) | Attending: Emergency Medicine | Admitting: Emergency Medicine

## 2014-05-03 ENCOUNTER — Encounter (HOSPITAL_COMMUNITY): Payer: Self-pay | Admitting: Emergency Medicine

## 2014-05-03 DIAGNOSIS — E119 Type 2 diabetes mellitus without complications: Secondary | ICD-10-CM | POA: Insufficient documentation

## 2014-05-03 DIAGNOSIS — Z794 Long term (current) use of insulin: Secondary | ICD-10-CM | POA: Insufficient documentation

## 2014-05-03 DIAGNOSIS — L03319 Cellulitis of trunk, unspecified: Principal | ICD-10-CM

## 2014-05-03 DIAGNOSIS — L02212 Cutaneous abscess of back [any part, except buttock]: Secondary | ICD-10-CM

## 2014-05-03 DIAGNOSIS — Z79899 Other long term (current) drug therapy: Secondary | ICD-10-CM | POA: Insufficient documentation

## 2014-05-03 DIAGNOSIS — L02219 Cutaneous abscess of trunk, unspecified: Secondary | ICD-10-CM | POA: Insufficient documentation

## 2014-05-03 DIAGNOSIS — Z880 Allergy status to penicillin: Secondary | ICD-10-CM | POA: Insufficient documentation

## 2014-05-03 DIAGNOSIS — G608 Other hereditary and idiopathic neuropathies: Secondary | ICD-10-CM | POA: Insufficient documentation

## 2014-05-03 DIAGNOSIS — I10 Essential (primary) hypertension: Secondary | ICD-10-CM | POA: Insufficient documentation

## 2014-05-03 DIAGNOSIS — Z87442 Personal history of urinary calculi: Secondary | ICD-10-CM | POA: Insufficient documentation

## 2014-05-03 LAB — CBC WITH DIFFERENTIAL/PLATELET
BASOS ABS: 0 10*3/uL (ref 0.0–0.1)
BASOS PCT: 0 % (ref 0–1)
EOS PCT: 3 % (ref 0–5)
Eosinophils Absolute: 0.3 10*3/uL (ref 0.0–0.7)
HEMATOCRIT: 37.3 % (ref 36.0–46.0)
Hemoglobin: 12.5 g/dL (ref 12.0–15.0)
LYMPHS PCT: 15 % (ref 12–46)
Lymphs Abs: 1.5 10*3/uL (ref 0.7–4.0)
MCH: 28.1 pg (ref 26.0–34.0)
MCHC: 33.5 g/dL (ref 30.0–36.0)
MCV: 83.8 fL (ref 78.0–100.0)
MONO ABS: 0.6 10*3/uL (ref 0.1–1.0)
Monocytes Relative: 6 % (ref 3–12)
Neutro Abs: 7.3 10*3/uL (ref 1.7–7.7)
Neutrophils Relative %: 76 % (ref 43–77)
Platelets: 315 10*3/uL (ref 150–400)
RBC: 4.45 MIL/uL (ref 3.87–5.11)
RDW: 13.1 % (ref 11.5–15.5)
WBC: 9.6 10*3/uL (ref 4.0–10.5)

## 2014-05-03 LAB — COMPREHENSIVE METABOLIC PANEL
ALBUMIN: 3 g/dL — AB (ref 3.5–5.2)
ALK PHOS: 141 U/L — AB (ref 39–117)
ALT: 18 U/L (ref 0–35)
AST: 15 U/L (ref 0–37)
BILIRUBIN TOTAL: 0.6 mg/dL (ref 0.3–1.2)
BUN: 6 mg/dL (ref 6–23)
CHLORIDE: 95 meq/L — AB (ref 96–112)
CO2: 24 mEq/L (ref 19–32)
Calcium: 8.4 mg/dL (ref 8.4–10.5)
Creatinine, Ser: 0.58 mg/dL (ref 0.50–1.10)
GFR calc non Af Amer: >90 mL/min (ref >90)
GLUCOSE: 369 mg/dL — AB (ref 70–99)
Potassium: 3.9 mEq/L (ref 3.7–5.3)
SODIUM: 132 meq/L — AB (ref 137–147)
Total Protein: 7 g/dL (ref 6.0–8.3)

## 2014-05-03 LAB — CBG MONITORING, ED
GLUCOSE-CAPILLARY: 339 mg/dL — AB (ref 70–99)
Glucose-Capillary: 265 mg/dL — ABNORMAL HIGH (ref 70–99)

## 2014-05-03 MED ORDER — SODIUM CHLORIDE 0.9 % IV BOLUS (SEPSIS)
1000.0000 mL | Freq: Once | INTRAVENOUS | Status: AC
  Administered 2014-05-03: 1000 mL via INTRAVENOUS

## 2014-05-03 MED ORDER — LIDOCAINE-EPINEPHRINE (PF) 2 %-1:200000 IJ SOLN
20.0000 mL | Freq: Once | INTRAMUSCULAR | Status: DC
Start: 2014-05-03 — End: 2014-05-03
  Filled 2014-05-03: qty 20

## 2014-05-03 MED ORDER — CLINDAMYCIN HCL 300 MG PO CAPS: 300.0000 mg | ORAL_CAPSULE | Freq: Four times a day (QID) | ORAL | Status: DC

## 2014-05-03 MED ORDER — OXYCODONE-ACETAMINOPHEN 5-325 MG PO TABS: 1.0000 | ORAL_TABLET | ORAL | Status: DC | PRN

## 2014-05-03 MED ORDER — ONDANSETRON HCL 4 MG/2ML IJ SOLN
4.0000 mg | Freq: Once | INTRAMUSCULAR | Status: AC
  Administered 2014-05-03: 4 mg via INTRAVENOUS
  Filled 2014-05-03: qty 2

## 2014-05-03 MED ORDER — SODIUM CHLORIDE 0.9 % IV SOLN: INTRAVENOUS | Status: DC

## 2014-05-03 MED ORDER — FENTANYL CITRATE 0.05 MG/ML IJ SOLN
100.0000 ug | INTRAMUSCULAR | Status: DC | PRN
Start: 2014-05-03 — End: 2014-05-03
  Administered 2014-05-03 (×2): 100 ug via INTRAVENOUS
  Filled 2014-05-03 (×2): qty 2

## 2014-05-03 NOTE — ED Notes (Addendum)
Abscess to rt mid back, onset 1 week ago,  Seen by Dr Margo Aye and advised to be admitted, but pt refused.  Taking antibiotic, Says cbg's running high, 300 at home.  Nausea, no vomiting.   Pt has insulin  pump

## 2014-05-03 NOTE — Discharge Instructions (Signed)
Use moist heat on the sore area, 3 or 4 times a day for 30 minutes. If you like, you can change to the new prescription, clindamycin, to improve the treatment for the infection. However, since the wound has been opened, the doxycycline, may work.    Abscess An abscess is an infected area that contains a collection of pus and debris.It can occur in almost any part of the body. An abscess is also known as a furuncle or boil. CAUSES  An abscess occurs when tissue gets infected. This can occur from blockage of oil or sweat glands, infection of hair follicles, or a minor injury to the skin. As the body tries to fight the infection, pus collects in the area and creates pressure under the skin. This pressure causes pain. People with weakened immune systems have difficulty fighting infections and get certain abscesses more often.  SYMPTOMS Usually an abscess develops on the skin and becomes a painful mass that is red, warm, and tender. If the abscess forms under the skin, you may feel a moveable soft area under the skin. Some abscesses break open (rupture) on their own, but most will continue to get worse without care. The infection can spread deeper into the body and eventually into the bloodstream, causing you to feel ill.  DIAGNOSIS  Your caregiver will take your medical history and perform a physical exam. A sample of fluid may also be taken from the abscess to determine what is causing your infection. TREATMENT  Your caregiver may prescribe antibiotic medicines to fight the infection. However, taking antibiotics alone usually does not cure an abscess. Your caregiver may need to make a small cut (incision) in the abscess to drain the pus. In some cases, gauze is packed into the abscess to reduce pain and to continue draining the area. HOME CARE INSTRUCTIONS   Only take over-the-counter or prescription medicines for pain, discomfort, or fever as directed by your caregiver.  If you were prescribed  antibiotics, take them as directed. Finish them even if you start to feel better.  If gauze is used, follow your caregiver's directions for changing the gauze.  To avoid spreading the infection:  Keep your draining abscess covered with a bandage.  Wash your hands well.  Do not share personal care items, towels, or whirlpools with others.  Avoid skin contact with others.  Keep your skin and clothes clean around the abscess.  Keep all follow-up appointments as directed by your caregiver. SEEK MEDICAL CARE IF:   You have increased pain, swelling, redness, fluid drainage, or bleeding.  You have muscle aches, chills, or a general ill feeling.  You have a fever. MAKE SURE YOU:   Understand these instructions.  Will watch your condition.  Will get help right away if you are not doing well or get worse. Document Released: 09/05/2005 Document Revised: 05/27/2012 Document Reviewed: 02/08/2012 [facility] Patient Information 2014 [place], [place].

## 2014-05-03 NOTE — ED Provider Notes (Signed)
CSN: [id number]     Arrival date & time 05/03/14  1134 History  This chart was scribed for [name], MD by Shari Heritage, ED Scribe. The patient was seen in room APA04/APA04. Patient's care was started at 1:06 PM.   Chief Complaint  Patient presents with  . Abscess    The history is provided by the patient. No language interpreter was used.    HPI Comments: [name] is a 48 y.o. female who presents to the Emergency Department complaining of a suspected abscess to right lower back where there is painful, fluctuant, erythematous area that has been gradually worsening for the past 1 week. There is associated nausea. Patient denies vomiting, fever, chills, chest pain, headache, abdominal pain, or any other symptoms at this time. Patient states that she had a hernia repair surgery about 4 weeks ago. Per medical records, she was seen here on 03/29/14 for abdominal pain after her surgery and had a CT that showed possible cellulitis. She was prescribed a 7-day course of doxycycline which she has taken as instructed. She has a history of DM (on Humalog only via pump) complicated by neuropathy, hypercholesterolemia, and hypertension.   Past Medical History  Diagnosis Date  . Diabetes mellitus     insulin pump 2013  . Neuropathy   . Hypertension   . Hypercholesteremia   . Shortness of breath   . Kidney stone    Past Surgical History  Procedure Laterality Date  . Cesarean section  1994;2000    [place]  . Cholecystectomy  1992  . Sinus sergery  1988    [place]  . Foot surgery Right March, 2013    [facility]-removal of bone spur  . Hysteroscopy with thermachoice  04/25/2012    Procedure: HYSTEROSCOPY WITH THERMACHOICE;  Surgeon: Lazaro Arms, MD;  Location: [facility];  Service: Gynecology;  Laterality: N/A;  total therapy time= 11 minutes 42 seconds; 34 ml D5w  in and 34 ml D5w out; temp =87 degrees F  . Cystoscopy w/ ureteral stent placement  05/08/2012    Procedure: CYSTOSCOPY  WITH RETROGRADE PYELOGRAM/URETERAL STENT PLACEMENT;  Surgeon: Ky Barban, MD;  Location: [facility];  Service: Urology;  Laterality: Right;  . Umbilical hernia repair N/A 03/25/2014    Procedure: UMBILICAL HERNIA REPAIR;  Surgeon: Marlane Hatcher, MD;  Location: [facility];  Service: General;  Laterality: N/A;  . Uterine ablation     Family History  Problem Relation Age of Onset  . Heart disease    . Cancer    . Diabetes    . Achalasia    . Anesthesia problems Neg Hx   . Hypotension Neg Hx   . Malignant hyperthermia Neg Hx   . Pseudochol deficiency Neg Hx   . Diabetes Mother   . Depression Paternal Grandmother   . Depression Paternal Grandfather    History  Substance Use Topics  . Smoking status: Never Smoker   . Smokeless tobacco: Never Used  . Alcohol Use: No   OB History   Grav Para Term Preterm Abortions TAB SAB Ect Mult Living                 Review of Systems  Constitutional: Negative for fever and chills.  Cardiovascular: Negative for chest pain.  Gastrointestinal: Positive for nausea. Negative for vomiting and abdominal pain.  Skin: Positive for wound (raised erythematous area to right lower back consistent with abscess).  Neurological: Negative for headaches.  All other systems  reviewed and are negative.     Allergies  Ciprofloxacin; Penicillins; Codeine; Morphine; Sulfonamide derivatives; and Vancomycin  Home Medications   Prior to Admission medications   Medication Sig Start Date End Date Taking? Authorizing Provider  docusate sodium 100 MG CAPS Take 100 mg by mouth 2 (two) times daily. 03/26/14   Marlane Hatcher, MD  doxycycline (VIBRAMYCIN) 100 MG capsule Take 1 capsule (100 mg total) by mouth 2 (two) times daily. One po bid x 7 days 03/29/14   Benny Lennert, MD  gabapentin (NEURONTIN) 100 MG capsule Take 100-200 mg by mouth 3 (three) times daily. Takes one capsule in the morning and one capsule in the afternoon, then takes two capsules at bedtime  10/19/13   Carlus Pavlov, MD  glucagon [facility]) 1 MG injection Inject in the muscle as needed for hypoglycemia 12/15/12   Carlus Pavlov, MD  Insulin Human (INSULIN PUMP) SOLN Inject into the skin continuous. insulin lispro (HUMALOG) 100 UNIT/ML injection    Historical Provider, MD  LORazepam (ATIVAN) 1 MG tablet Take 1 tablet (1 mg total) by mouth 2 (two) times daily as needed (dizziness). 02/28/14   Donnetta Hutching, MD  metFORMIN (GLUCOPHAGE) 500 MG tablet Take 1,000 mg by mouth 2 (two) times daily with a meal.     Historical Provider, MD  nitroGLYCERIN (NITROSTAT) 0.4 MG SL tablet Place 1 tablet (0.4 mg total) under the tongue every 5 (five) minutes as needed for chest pain. Go to ER if 2nd dose needed 11/24/12 03/29/14  Salley Scarlet, MD  triamcinolone cream (KENALOG) 0.1 % Apply 1 application topically daily as needed (rash).  02/16/14   Historical Provider, MD   Triage vitals: BP 129/69  Pulse 101  Temp(Src) 98.1 F (36.7 C) (Oral)  Resp 20  Ht 4\' 8"  (1.422 m)  Wt 190 lb (86.183 kg)  BMI 42.62 kg/m2  SpO2 98% Physical Exam  Nursing note and vitals reviewed. Constitutional: She is oriented to person, place, and time. She appears well-developed and well-nourished.  HENT:  Head: Normocephalic and atraumatic.  Right Ear: External ear normal.  Left Ear: External ear normal.  Eyes: Conjunctivae and EOM are normal. Pupils are equal, round, and reactive to light.  Neck: Normal range of motion and phonation normal. Neck supple.  Cardiovascular: Normal rate, regular rhythm, normal heart sounds and intact distal pulses.   Pulmonary/Chest: Effort normal and breath sounds normal. She exhibits no bony tenderness.  Abdominal: Soft. There is no tenderness.  Musculoskeletal: Normal range of motion.  5x11 cm fluctuant area with redness and area of pointing to right upper lumbar region.  Neurological: She is alert and oriented to person, place, and time. No cranial nerve deficit or  sensory deficit. She exhibits normal muscle tone. Coordination normal.  Skin: Skin is warm, dry and intact.  Psychiatric: She has a normal mood and affect. Her behavior is normal. Judgment and thought content normal.    ED Course  Procedures (including critical care time)  Medications  0.9 %  sodium chloride infusion (not administered)  lidocaine-EPINEPHrine (XYLOCAINE W/EPI) 2 %-1:200000 (PF) injection 20 mL (not administered)  fentaNYL (SUBLIMAZE) injection 100 mcg (100 mcg Intravenous Given 05/03/14 1516)  sodium chloride 0.9 % bolus 1,000 mL (0 mLs Intravenous Stopped 05/03/14 1516)  ondansetron (ZOFRAN) injection 4 mg (4 mg Intravenous Given 05/03/14 1335)    Patient Vitals for the past 24 hrs:  BP Temp Temp src Pulse Resp SpO2 Height Weight  05/03/14 1530 115/71 mmHg - -  86 - 100 % - -  05/03/14 1141 129/69 mmHg 98.1 F (36.7 C) Oral 101 20 98 % 4\' 8"  (1.422 m) 190 lb (86.183 kg)    COORDINATION OF CARE: 1:13 PM- CBG was 339 upon arrival. Will order IV fluids, fentanyl and Zofran.  Patient informed of current plan for treatment and evaluation and agrees with plan at this time.  2:17 PM - Performed I & D of abscess. Only 2 inches of packing were inserted at wound site because patient could not tolerate procedure further.  3:11 PM - She is still reporting pain after fentanyl injection. Blood glucose has improved to 265 after IVF.   4:44 PM Reevaluation with update and discussion. After initial assessment and treatment, an updated evaluation reveals she is comfortable. She would like to go home. She feels like she can eat. She will use her insulin pump for boluses and ongoing infusions. Findings discussed with patient and family members, all questions answered.   INCISION AND DRAINAGE PROCEDURE NOTE: Patient identification was confirmed and verbal consent was obtained. This procedure was performed by [name], MD at 2:17 PM. Site: right upper lumbar  region Sterile procedures observed Needle size: 25 gauge Anesthetic used: 2% xylocaine with epi Blade size: 11 Drainage: moderate amount of purulent pus Complexity: Complex Packing used Site anesthetized, incision made over site, wound drained and explored loculations, rinsed with copious amounts of normal saline, wound packed with iodoform packing strips, covered with dry, sterile dressing.  Instructions for care discussed verbally and pt provided with additional written instructions for homecare and f/u.  Labs Review Labs Reviewed  COMPREHENSIVE METABOLIC PANEL - Abnormal; Notable for the following:    Sodium 132 (*)    Chloride 95 (*)    Glucose, Bld 369 (*)    Albumin 3.0 (*)    Alkaline Phosphatase 141 (*)    All other components within normal limits  CBG MONITORING, ED - Abnormal; Notable for the following:    Glucose-Capillary 339 (*)    All other components within normal limits  CBG MONITORING, ED - Abnormal; Notable for the following:    Glucose-Capillary 265 (*)    All other components within normal limits  CBC WITH DIFFERENTIAL    Imaging Review No results found.   EKG Interpretation None      MDM   Final diagnoses:  Abscess of back    Cutaneous abscess with hyperglycemia. Doubt DKA, serious bacterial infection or metabolic instability. She has been treated with improvement.  Nursing Notes Reviewed/ Care Coordinated Applicable Imaging Reviewed Interpretation of Laboratory Data incorporated into ED treatment  The patient appears reasonably screened and/or stabilized for discharge and I doubt any other medical condition or other [facility] requiring further screening, evaluation, or treatment in the ED at this time prior to discharge.  Plan: Home Medications- Clindamycin, Percocet; Home Treatments- warm Compresses; return here if the recommended treatment, does not improve the symptoms; Recommended follow up- Gen. Surg. In 3 days as scheduled, already.  I  personally performed the services described in this documentation, which was scribed in my presence. The recorded information has been reviewed and is accurate.      [facility], MD 05/03/14 [phone]

## 2014-05-06 ENCOUNTER — Ambulatory Visit (INDEPENDENT_AMBULATORY_CARE_PROVIDER_SITE_OTHER): Payer: Managed Care, Other (non HMO) | Admitting: Otolaryngology

## 2014-05-06 DIAGNOSIS — H8110 Benign paroxysmal vertigo, unspecified ear: Secondary | ICD-10-CM

## 2014-05-06 DIAGNOSIS — R42 Dizziness and giddiness: Secondary | ICD-10-CM

## 2014-05-06 DIAGNOSIS — H903 Sensorineural hearing loss, bilateral: Secondary | ICD-10-CM

## 2014-06-03 ENCOUNTER — Ambulatory Visit (INDEPENDENT_AMBULATORY_CARE_PROVIDER_SITE_OTHER): Payer: Managed Care, Other (non HMO) | Admitting: Otolaryngology

## 2014-07-29 ENCOUNTER — Telehealth: Payer: Self-pay | Admitting: *Deleted

## 2014-07-29 NOTE — Telephone Encounter (Signed)
Left message to schedule appointment

## 2014-09-21 ENCOUNTER — Emergency Department (HOSPITAL_COMMUNITY): Payer: Managed Care, Other (non HMO)

## 2014-09-21 ENCOUNTER — Emergency Department (HOSPITAL_COMMUNITY)
Admission: EM | Admit: 2014-09-21 | Discharge: 2014-09-21 | Disposition: A | Discharge status: Home / Self Care | Payer: Managed Care, Other (non HMO) | Attending: Urology | Admitting: Urology

## 2014-09-21 ENCOUNTER — Encounter (HOSPITAL_COMMUNITY): Payer: Self-pay | Admitting: Emergency Medicine

## 2014-09-21 DIAGNOSIS — Z880 Allergy status to penicillin: Secondary | ICD-10-CM | POA: Insufficient documentation

## 2014-09-21 DIAGNOSIS — G629 Polyneuropathy, unspecified: Secondary | ICD-10-CM | POA: Diagnosis not present

## 2014-09-21 DIAGNOSIS — Z794 Long term (current) use of insulin: Secondary | ICD-10-CM | POA: Insufficient documentation

## 2014-09-21 DIAGNOSIS — Z9089 Acquired absence of other organs: Secondary | ICD-10-CM | POA: Insufficient documentation

## 2014-09-21 DIAGNOSIS — R8299 Other abnormal findings in urine: Secondary | ICD-10-CM | POA: Diagnosis not present

## 2014-09-21 DIAGNOSIS — R1012 Left upper quadrant pain: Secondary | ICD-10-CM | POA: Diagnosis present

## 2014-09-21 DIAGNOSIS — I10 Essential (primary) hypertension: Secondary | ICD-10-CM | POA: Insufficient documentation

## 2014-09-21 DIAGNOSIS — N200 Calculus of kidney: Secondary | ICD-10-CM | POA: Insufficient documentation

## 2014-09-21 DIAGNOSIS — Z7982 Long term (current) use of aspirin: Secondary | ICD-10-CM | POA: Diagnosis not present

## 2014-09-21 DIAGNOSIS — E780 Pure hypercholesterolemia: Secondary | ICD-10-CM | POA: Insufficient documentation

## 2014-09-21 DIAGNOSIS — R8271 Bacteriuria: Secondary | ICD-10-CM

## 2014-09-21 DIAGNOSIS — E119 Type 2 diabetes mellitus without complications: Secondary | ICD-10-CM | POA: Insufficient documentation

## 2014-09-21 DIAGNOSIS — Z3202 Encounter for pregnancy test, result negative: Secondary | ICD-10-CM | POA: Diagnosis not present

## 2014-09-21 DIAGNOSIS — Z79899 Other long term (current) drug therapy: Secondary | ICD-10-CM | POA: Diagnosis not present

## 2014-09-21 LAB — CBC WITH DIFFERENTIAL/PLATELET
BASOS ABS: 0 10*3/uL (ref 0.0–0.1)
Basophils Relative: 1 % (ref 0–1)
Eosinophils Absolute: 0.2 10*3/uL (ref 0.0–0.7)
Eosinophils Relative: 3 % (ref 0–5)
HEMATOCRIT: 40.5 % (ref 36.0–46.0)
HEMOGLOBIN: 13.9 g/dL (ref 12.0–15.0)
LYMPHS PCT: 38 % (ref 12–46)
Lymphs Abs: 2.4 10*3/uL (ref 0.7–4.0)
MCH: 28.8 pg (ref 26.0–34.0)
MCHC: 34.3 g/dL (ref 30.0–36.0)
MCV: 84 fL (ref 78.0–100.0)
MONO ABS: 0.5 10*3/uL (ref 0.1–1.0)
MONOS PCT: 9 % (ref 3–12)
NEUTROS ABS: 3.2 10*3/uL (ref 1.7–7.7)
Neutrophils Relative %: 49 % (ref 43–77)
Platelets: 273 10*3/uL (ref 150–400)
RBC: 4.82 MIL/uL (ref 3.87–5.11)
RDW: 13.3 % (ref 11.5–15.5)
WBC: 6.4 10*3/uL (ref 4.0–10.5)

## 2014-09-21 LAB — LIPASE, BLOOD: LIPASE: 30 U/L (ref 11–59)

## 2014-09-21 LAB — URINALYSIS, ROUTINE W REFLEX MICROSCOPIC
Bilirubin Urine: NEGATIVE
GLUCOSE, UA: NEGATIVE mg/dL
Ketones, ur: NEGATIVE mg/dL
Nitrite: NEGATIVE
Protein, ur: NEGATIVE mg/dL
SPECIFIC GRAVITY, URINE: 1.02 (ref 1.005–1.030)
Urobilinogen, UA: 0.2 mg/dL (ref 0.0–1.0)
pH: 6 (ref 5.0–8.0)

## 2014-09-21 LAB — COMPREHENSIVE METABOLIC PANEL
ALT: 35 U/L (ref 0–35)
AST: 31 U/L (ref 0–37)
Albumin: 3.6 g/dL (ref 3.5–5.2)
Alkaline Phosphatase: 124 U/L — ABNORMAL HIGH (ref 39–117)
Anion gap: 12 (ref 5–15)
BILIRUBIN TOTAL: 0.8 mg/dL (ref 0.3–1.2)
BUN: 8 mg/dL (ref 6–23)
CHLORIDE: 100 meq/L (ref 96–112)
CO2: 26 mEq/L (ref 19–32)
CREATININE: 0.56 mg/dL (ref 0.50–1.10)
Calcium: 8.7 mg/dL (ref 8.4–10.5)
GFR calc Af Amer: >90 mL/min (ref >90)
GFR calc non Af Amer: >90 mL/min (ref >90)
Glucose, Bld: 172 mg/dL — ABNORMAL HIGH (ref 70–99)
Potassium: 4 mEq/L (ref 3.7–5.3)
Sodium: 138 mEq/L (ref 137–147)
Total Protein: 7.5 g/dL (ref 6.0–8.3)

## 2014-09-21 LAB — URINE MICROSCOPIC-ADD ON

## 2014-09-21 LAB — TROPONIN I: Troponin I: <0.3 ng/mL (ref <0.30)

## 2014-09-21 LAB — POC URINE PREG, ED: PREG TEST UR: NEGATIVE

## 2014-09-21 MED ORDER — KETOROLAC TROMETHAMINE 30 MG/ML IJ SOLN
30.0000 mg | Freq: Once | INTRAMUSCULAR | Status: AC
  Administered 2014-09-21: 30 mg via INTRAVENOUS
  Filled 2014-09-21: qty 1

## 2014-09-21 MED ORDER — SODIUM CHLORIDE 0.9 % IJ SOLN
INTRAMUSCULAR | Status: AC
  Filled 2014-09-21: qty 45

## 2014-09-21 MED ORDER — IOHEXOL 300 MG/ML  SOLN
100.0000 mL | Freq: Once | INTRAMUSCULAR | Status: AC | PRN
  Administered 2014-09-21: 100 mL via INTRAVENOUS

## 2014-09-21 MED ORDER — HYDROMORPHONE HCL 1 MG/ML IJ SOLN
1.0000 mg | Freq: Once | INTRAMUSCULAR | Status: AC
  Administered 2014-09-21: 1 mg via INTRAVENOUS
  Filled 2014-09-21: qty 1

## 2014-09-21 MED ORDER — SODIUM CHLORIDE 0.9 % IJ SOLN
INTRAMUSCULAR | Status: AC
  Filled 2014-09-21: qty 500

## 2014-09-21 MED ORDER — ONDANSETRON HCL 4 MG/2ML IJ SOLN
4.0000 mg | Freq: Once | INTRAMUSCULAR | Status: AC
  Administered 2014-09-21: 4 mg via INTRAVENOUS
  Filled 2014-09-21: qty 2

## 2014-09-21 NOTE — Consult Note (Signed)
Urology Consult   Physician requesting consult: Ward  Reason for consult: Kidney stone  History of Present Illness: [name] is a 48 y.o. female who presented to the emergency room here at [facility] earlier today with a four-day history of right and left flank pain. This started last Friday. She took Tylenol for the weekend. She went to her primary care physician today for routine physical exam but was also complaining of bladder pain and was sent to the emergency room. CT stone protocol revealed a  5 x 3 mm right UVJ stone with mild hydroureteronephrosis and multiple right interpolar and lower pole calculi. She was given Dilaudid x2, but was still having pain and requested urologic consultation. She does have a prior history of urolithiasis, and has had at least one lithotripsy in the past. She's had no fever, chills, nausea or vomiting. She denies any gross hematuria.    Past Medical History  Diagnosis Date  . Diabetes mellitus     insulin pump 2013  . Neuropathy   . Hypertension   . Hypercholesteremia   . Shortness of breath   . Kidney stone     Past Surgical History  Procedure Laterality Date  . Cesarean section  1994;2000    [place]  . Cholecystectomy  1992  . Sinus [place]  . Foot surgery Right March, 2013    [facility]-removal of bone spur  . Hysteroscopy with thermachoice  04/25/2012    Procedure: HYSTEROSCOPY WITH THERMACHOICE;  Surgeon: Florian Buff, MD;  Location: [facility];  Service: Gynecology;  Laterality: N/A;  total therapy time= 11 minutes 42 seconds; 34 ml D5w  in and 34 ml D5w out; temp =87 degrees F  . Cystoscopy w/ ureteral stent placement  05/08/2012    Procedure: CYSTOSCOPY WITH RETROGRADE PYELOGRAM/URETERAL STENT PLACEMENT;  Surgeon: Marissa Nestle, MD;  Location: [facility];  Service: Urology;  Laterality: Right;  . Umbilical hernia repair N/A 03/25/2014    Procedure: UMBILICAL HERNIA REPAIR;  Surgeon: Scherry Ran, MD;   Location: [facility];  Service: General;  Laterality: N/A;  . Uterine ablation       Current Hospital Medications: Scheduled Meds: . sodium chloride      . sodium chloride       Continuous Infusions:  PRN Meds:.  Allergies:  Allergies  Allergen Reactions  . Ciprofloxacin Hives and Swelling  . Penicillins Anaphylaxis and Rash  . Codeine Other (See Comments)    REACTION:  had hallicunations  . Morphine Nausea And Vomiting and Other (See Comments)    REACTION:   recieved in ED due to [facility] and had multple doses - gave visual hallucinations and vomitting.  . Sulfonamide Derivatives Other (See Comments)    REACTION: Unsure - childhood allergy  . Vancomycin Itching    Family History  Problem Relation Age of Onset  . Heart disease    . Cancer    . Diabetes    . Achalasia    . Anesthesia problems Neg Hx   . Hypotension Neg Hx   . Malignant hyperthermia Neg Hx   . Pseudochol deficiency Neg Hx   . Diabetes Mother   . Depression Paternal Grandmother   . Depression Paternal Grandfather     Social History:  reports that she has never smoked. She has never used smokeless tobacco. She reports that she does not drink alcohol or use illicit drugs.  ROS: A complete review of systems was performed.  All systems are negative except for pertinent findings as noted.  Physical Exam:  Vital signs in last 24 hours: Temp:  [98.4 F (36.9 C)] 98.4 F (36.9 C) (10/13 1010) Pulse Rate:  [65-71] 70 (10/13 1700) Resp:  [15-18] 15 (10/13 1700) BP: (100-122)/(56-73) 119/73 mmHg (10/13 1700) SpO2:  [96 %-100 %] 96 % (10/13 1700) Weight:  [79.379 kg (175 lb)] 79.379 kg (175 lb) (10/13 1010) General:  Alert and oriented, No acute distress HEENT: Normocephalic, atraumatic Neck: No JVD or lymphadenopathy Cardiovascular: Regular rate and rhythm Lungs: Clear bilaterally Abdomen: Soft, obese. Minimal right lower quadrant and right CVA tenderness. Back: No CVA tenderness Extremities: No  edema Neurologic: Grossly intact  Laboratory Data:   Recent Labs  09/21/14 1100  WBC 6.4  HGB 13.9  HCT 40.5  PLT 273     Recent Labs  09/21/14 1100  NA 138  K 4.0  CL 100  GLUCOSE 172*  BUN 8  CALCIUM 8.7  CREATININE 0.56     Results for orders placed during the hospital encounter of 09/21/14 (from the past 24 hour(s))  URINALYSIS, ROUTINE W REFLEX MICROSCOPIC     Status: Abnormal   Collection Time    09/21/14 10:55 AM      Result Value Ref Range   Color, Urine YELLOW  YELLOW   APPearance CLEAR  CLEAR   Specific Gravity, Urine 1.020  1.005 - 1.030   pH 6.0  5.0 - 8.0   Glucose, UA NEGATIVE  NEGATIVE mg/dL   Hgb urine dipstick TRACE (*) NEGATIVE   Bilirubin Urine NEGATIVE  NEGATIVE   Ketones, ur NEGATIVE  NEGATIVE mg/dL   Protein, ur NEGATIVE  NEGATIVE mg/dL   Urobilinogen, UA 0.2  0.0 - 1.0 mg/dL   Nitrite NEGATIVE  NEGATIVE   Leukocytes, UA SMALL (*) NEGATIVE  URINE MICROSCOPIC-ADD ON     Status: Abnormal   Collection Time    09/21/14 10:55 AM      Result Value Ref Range   Squamous Epithelial / LPF RARE  RARE   WBC, UA 3-6  <3 WBC/hpf   RBC / HPF 0-2  <3 RBC/hpf   Bacteria, UA FEW (*) RARE  POC URINE PREG, ED     Status: None   Collection Time    09/21/14 10:58 AM      Result Value Ref Range   Preg Test, Ur NEGATIVE  NEGATIVE  CBC WITH DIFFERENTIAL     Status: None   Collection Time    09/21/14 11:00 AM      Result Value Ref Range   WBC 6.4  4.0 - 10.5 K/uL   RBC 4.82  3.87 - 5.11 MIL/uL   Hemoglobin 13.9  12.0 - 15.0 g/dL   HCT 40.5  36.0 - 46.0 %   MCV 84.0  78.0 - 100.0 fL   MCH 28.8  26.0 - 34.0 pg   MCHC 34.3  30.0 - 36.0 g/dL   RDW 13.3  11.5 - 15.5 %   Platelets 273  150 - 400 K/uL   Neutrophils Relative % 49  43 - 77 %   Neutro Abs 3.2  1.7 - 7.7 K/uL   Lymphocytes Relative 38  12 - 46 %   Lymphs Abs 2.4  0.7 - 4.0 K/uL   Monocytes Relative 9  3 - 12 %   Monocytes Absolute 0.5  0.1 - 1.0 K/uL   Eosinophils Relative 3  0 - 5 %    Eosinophils Absolute 0.2  0.0 - 0.7 K/uL   Basophils Relative 1  0 - 1 %   Basophils Absolute 0.0  0.0 - 0.1 K/uL  COMPREHENSIVE METABOLIC PANEL     Status: Abnormal   Collection Time    09/21/14 11:00 AM      Result Value Ref Range   Sodium 138  137 - 147 mEq/L   Potassium 4.0  3.7 - 5.3 mEq/L   Chloride 100  96 - 112 mEq/L   CO2 26  19 - 32 mEq/L   Glucose, Bld 172 (*) 70 - 99 mg/dL   BUN 8  6 - 23 mg/dL   Creatinine, Ser 0.56  0.50 - 1.10 mg/dL   Calcium 8.7  8.4 - 10.5 mg/dL   Total Protein 7.5  6.0 - 8.3 g/dL   Albumin 3.6  3.5 - 5.2 g/dL   AST 31  0 - 37 U/L   ALT 35  0 - 35 U/L   Alkaline Phosphatase 124 (*) 39 - 117 U/L   Total Bilirubin 0.8  0.3 - 1.2 mg/dL   GFR calc non Af Amer >90  >90 mL/min   GFR calc Af Amer >90  >90 mL/min   Anion gap 12  5 - 15  LIPASE, BLOOD     Status: None   Collection Time    09/21/14 11:00 AM      Result Value Ref Range   Lipase 30  11 - 59 U/L  TROPONIN I     Status: None   Collection Time    09/21/14 11:00 AM      Result Value Ref Range   Troponin I <0.30  <0.30 ng/mL   No results found for this or any previous visit (from the past 240 hour(s)).  Renal Function:  Recent Labs  09/21/14 1100  CREATININE 0.56   Estimated Creatinine Clearance: 72.6 ml/min (by C-G formula based on Cr of 0.56).  Radiologic Imaging: Ct Abdomen Pelvis W Contrast  09/21/2014   CLINICAL DATA:  Left flank pain and left upper quadrant pain with tenderness to touch. Symptoms have been present for 4 days.  EXAM: CT ABDOMEN AND PELVIS WITH CONTRAST  TECHNIQUE: Multidetector CT imaging of the abdomen and pelvis was performed using the standard protocol following bolus administration of intravenous contrast.  CONTRAST:  149m OMNIPAQUE IOHEXOL 300 MG/ML  SOLN  COMPARISON:  CT scan dated 03/29/2014  FINDINGS: There is a 7.5 mm stone in the distal right ureter approximately 3 cm above the ureterovesical junction with no dilatation of the right ureter. There are  at least 7 other stones in right kidney. The kidney appears normal.  Gallbladder has been removed. Biliary tree is normal. Diffuse hepatic steatosis without focal lesion. Spleen, pancreas, and adrenal glands are normal. The bowel is normal.  There is a 2.2 cm cyst on the left ovary. Right ovary is normal. The uterine fundus is not inhomogeneous, possibly representing a uterine fibroid but this is nonspecific. Overall size of the uterus has not changed since the prior study of 06/23/2012.  IMPRESSION: 1. 7.5 mm stone in the distal right ureter without obstruction. Multiple right renal calculi. 2. 2.2 cm cyst on the left ovary. 3. Hepatic steatosis. 4. Inhomogeneous in uterine fundus suggestive of but not diagnostic for a uterine fibroid.   Electronically Signed   By: JRozetta NunneryM.D.   On: 09/21/2014 13:23    I independently reviewed the above imaging studies. It appears that her right UVJ stone is 5  x 3 mm in size. There are multiple interpolar and right lower pole calculi. Hounsfield units approximately 1000.  Impression/Asses 1. Right UVJ stone. The patient does not look that uncomfortable at the present time, and based on the size and location, I think this may well pass  2. Numerous right renal calculi. I think these will eventually need treatment Plan:  I had along discussion with the patient and her husband, who attended the visit today. We discussed the fact that this is a right distal ureteral stone, and despite her "not passing any in the past", I think that the reason that happened is that she was not given a chance that MET. I strongly suggested that we should let her attempt to pass the stone, giving her adequate narcotic management as well as tamsulosin. She will call our office for appropriate followup. She knows to call us sooner if she has fever with this stone, inability to keep food or fluid down, or signs of urinary tract infection.

## 2014-09-21 NOTE — ED Notes (Signed)
Patient given discharge instruction, verbalized understand. IV removed, band aid applied. Patient ambulatory out of the department.  

## 2014-09-21 NOTE — ED Notes (Signed)
Urology at the bedside.

## 2014-09-21 NOTE — ED Provider Notes (Signed)
Medical screening examination/treatment/procedure(s) were conducted as a shared visit with non-physician practitioner(s) and myself.  I personally evaluated the patient during the encounter.   EKG Interpretation   Date/Time:  Tuesday September 21 2014 15:22:33 EDT Ventricular Rate:  68 PR Interval:  155 QRS Duration: 81 QT Interval:  396 QTC Calculation: 421 R Axis:   45 Text Interpretation:  Sinus rhythm Low voltage, precordial leads Confirmed  by Lowana Hable,  DO, Ellanore Vanhook ([id number]) on 09/21/2014 3:32:51 PM      Pt is a 48 y.o. F with left-sided flank pain. CT scan shows a right 7.5 mm distal ureteral stone with no hydronephrosis. She does appear to have a mild UTI. Otherwise labs unremarkable. Her left flank pain is for producible with palpation of her back she reports this feels exactly like when she had a right kidney stone in the past. Will discuss with urology for disposition.  [name] Maw Ardith Test, DO 09/21/14 [phone]

## 2014-09-21 NOTE — ED Notes (Signed)
PT c/o left flank pain x4 days and pt has noticed blood in her urine. PT has hx of kidney stones and was sent from PCP office this morning.

## 2014-09-21 NOTE — ED Notes (Signed)
Lt side/flank pain started Friday - seen at MD office instructed to come to ER- Hx of Kidney Stones

## 2014-09-21 NOTE — ED Provider Notes (Signed)
CSN: [id number]     Arrival date & time 09/21/14  1004 History   First MD Initiated Contact with Patient 09/21/14 1026     Chief Complaint  Patient presents with  . Flank Pain     (Consider location/radiation/quality/duration/timing/severity/associated sxs/prior Treatment) The history is provided by the patient.   [name] is a 48 y.o. female with a past medical history of diabetes, hypertension and kidney stones inquiring lithotripsy presenting with a four-day history of left flank pain which is described as constant, deep and aching as   if she has been punched in the side, denies trauma, and states her symptoms are not typical of kidney stone pain.  She was seen by her PCP this morning who advised she come here for imaging studies.  She does endorse decreased urinary frequency and urinating small amounts of urine, denies dysuria, hematuria.  She has had nausea without emesis.  She denies fevers or chills.  She has found no alleviators including Tylenol.  She describes her pain is worse with palpation, even mild touch.  She denies rash.    Past Medical History  Diagnosis Date  . Diabetes mellitus     insulin pump 2013  . Neuropathy   . Hypertension   . Hypercholesteremia   . Shortness of breath   . Kidney stone    Past Surgical History  Procedure Laterality Date  . Cesarean section  1994;2000    [place]  . Cholecystectomy  1992  . Sinus sergery  1988    [place]  . Foot surgery Right March, 2013    [facility]-removal of bone spur  . Hysteroscopy with thermachoice  04/25/2012    Procedure: HYSTEROSCOPY WITH THERMACHOICE;  Surgeon: Lazaro Arms, MD;  Location: [facility];  Service: Gynecology;  Laterality: N/A;  total therapy time= 11 minutes 42 seconds; 34 ml D5w  in and 34 ml D5w out; temp =87 degrees F  . Cystoscopy w/ ureteral stent placement  05/08/2012    Procedure: CYSTOSCOPY WITH RETROGRADE PYELOGRAM/URETERAL STENT PLACEMENT;  Surgeon: Ky Barban, MD;   Location: [facility];  Service: Urology;  Laterality: Right;  . Umbilical hernia repair N/A 03/25/2014    Procedure: UMBILICAL HERNIA REPAIR;  Surgeon: Marlane Hatcher, MD;  Location: [facility];  Service: General;  Laterality: N/A;  . Uterine ablation     Family History  Problem Relation Age of Onset  . Heart disease    . Cancer    . Diabetes    . Achalasia    . Anesthesia problems Neg Hx   . Hypotension Neg Hx   . Malignant hyperthermia Neg Hx   . Pseudochol deficiency Neg Hx   . Diabetes Mother   . Depression Paternal Grandmother   . Depression Paternal Grandfather    History  Substance Use Topics  . Smoking status: Never Smoker   . Smokeless tobacco: Never Used  . Alcohol Use: No   OB History   Grav Para Term Preterm Abortions TAB SAB Ect Mult Living                 Review of Systems  Constitutional: Negative for fever.  HENT: Negative for congestion and sore throat.   Eyes: Negative.   Respiratory: Negative for chest tightness and shortness of breath.   Cardiovascular: Negative for chest pain.  Gastrointestinal: Positive for nausea and abdominal pain. Negative for vomiting, diarrhea and constipation.  Genitourinary: Negative.   Musculoskeletal: Negative for arthralgias, joint swelling and neck  pain.  Skin: Negative.  Negative for rash and wound.  Neurological: Negative for dizziness, weakness, light-headedness, numbness and headaches.  Psychiatric/Behavioral: Negative.       Allergies  Ciprofloxacin; Penicillins; Codeine; Morphine; Sulfonamide derivatives; and Vancomycin  Home Medications   Prior to Admission medications   Medication Sig Start Date End Date Taking? Authorizing Provider  acetaminophen (TYLENOL) 500 MG tablet Take 1,000 mg by mouth every 6 (six) hours as needed for mild pain.   Yes Historical Provider, MD  aspirin 81 MG chewable tablet Chew 81 mg by mouth daily.   Yes Historical Provider, MD  glucagon (GLUCAGON EMERGENCY) 1 MG injection Inject in  the muscle as needed for hypoglycemia 12/15/12  Yes Carlus Pavlov, MD  Insulin Human (INSULIN PUMP) SOLN Inject into the skin continuous. insulin lispro (HUMALOG) 100 UNIT/ML injection   Yes Historical Provider, MD  INVOKANA 100 MG TABS Take 1 tablet by mouth daily. 09/13/14  Yes Historical Provider, MD  lisinopril (PRINIVIL,ZESTRIL) 10 MG tablet Take 1 tablet by mouth daily. 09/07/14  Yes Historical Provider, MD  LORazepam (ATIVAN) 0.5 MG tablet Take 1 tablet by mouth 2 (two) times daily as needed for anxiety.  09/01/14  Yes Historical Provider, MD  pregabalin (LYRICA) 50 MG capsule Take 100 mg by mouth 2 (two) times daily.   Yes Historical Provider, MD  simvastatin (ZOCOR) 10 MG tablet Take 1 tablet by mouth every evening. 09/07/14  Yes Historical Provider, MD   BP 100/62  Pulse 69  Temp(Src) 98.4 F (36.9 C) (Oral)  Resp 18  Ht 4\' 8"  (1.422 m)  Wt 175 lb (79.379 kg)  BMI 39.26 kg/m2  SpO2 100% Physical Exam  Nursing note and vitals reviewed. Constitutional: She appears well-developed and well-nourished.  HENT:  Head: Normocephalic and atraumatic.  Eyes: Conjunctivae are normal.  Neck: Normal range of motion.  Cardiovascular: Normal rate, regular rhythm, normal heart sounds and intact distal pulses.   Pulmonary/Chest: Effort normal and breath sounds normal. She has no wheezes. She has no rales.  Abdominal: Soft. Bowel sounds are normal. There is tenderness in the left upper quadrant. There is CVA tenderness. There is no tenderness at McBurney's point and negative Murphy's sign.  Tender to even mild palpation and touch of left flank area and laterally to the midaxillary line.  There is no bruising, erythema or rash.    Musculoskeletal: Normal range of motion.  Neurological: She is alert.  Skin: Skin is warm and dry.  Psychiatric: She has a normal mood and affect.   Results for orders placed during the hospital encounter of 09/21/14  URINALYSIS, ROUTINE W REFLEX MICROSCOPIC       Result Value Ref Range   Color, Urine YELLOW  YELLOW   APPearance CLEAR  CLEAR   Specific Gravity, Urine 1.020  1.005 - 1.030   pH 6.0  5.0 - 8.0   Glucose, UA NEGATIVE  NEGATIVE mg/dL   Hgb urine dipstick TRACE (*) NEGATIVE   Bilirubin Urine NEGATIVE  NEGATIVE   Ketones, ur NEGATIVE  NEGATIVE mg/dL   Protein, ur NEGATIVE  NEGATIVE mg/dL   Urobilinogen, UA 0.2  0.0 - 1.0 mg/dL   Nitrite NEGATIVE  NEGATIVE   Leukocytes, UA SMALL (*) NEGATIVE  CBC WITH DIFFERENTIAL      Result Value Ref Range   WBC 6.4  4.0 - 10.5 K/uL   RBC 4.82  3.87 - 5.11 MIL/uL   Hemoglobin 13.9  12.0 - 15.0 g/dL   HCT 62.7  03.5 -  46.0 %   MCV 84.0  78.0 - 100.0 fL   MCH 28.8  26.0 - 34.0 pg   MCHC 34.3  30.0 - 36.0 g/dL   RDW 54.0  08.6 - 76.1 %   Platelets 273  150 - 400 K/uL   Neutrophils Relative % 49  43 - 77 %   Neutro Abs 3.2  1.7 - 7.7 K/uL   Lymphocytes Relative 38  12 - 46 %   Lymphs Abs 2.4  0.7 - 4.0 K/uL   Monocytes Relative 9  3 - 12 %   Monocytes Absolute 0.5  0.1 - 1.0 K/uL   Eosinophils Relative 3  0 - 5 %   Eosinophils Absolute 0.2  0.0 - 0.7 K/uL   Basophils Relative 1  0 - 1 %   Basophils Absolute 0.0  0.0 - 0.1 K/uL  COMPREHENSIVE METABOLIC PANEL      Result Value Ref Range   Sodium 138  137 - 147 mEq/L   Potassium 4.0  3.7 - 5.3 mEq/L   Chloride 100  96 - 112 mEq/L   CO2 26  19 - 32 mEq/L   Glucose, Bld 172 (*) 70 - 99 mg/dL   BUN 8  6 - 23 mg/dL   Creatinine, Ser 9.50  0.50 - 1.10 mg/dL   Calcium 8.7  8.4 - 93.2 mg/dL   Total Protein 7.5  6.0 - 8.3 g/dL   Albumin 3.6  3.5 - 5.2 g/dL   AST 31  0 - 37 U/L   ALT 35  0 - 35 U/L   Alkaline Phosphatase 124 (*) 39 - 117 U/L   Total Bilirubin 0.8  0.3 - 1.2 mg/dL   GFR calc non Af Amer >90  >90 mL/min   GFR calc Af Amer >90  >90 mL/min   Anion gap 12  5 - 15  URINE MICROSCOPIC-ADD ON      Result Value Ref Range   Squamous Epithelial / LPF RARE  RARE   WBC, UA 3-6  <3 WBC/hpf   RBC / HPF 0-2  <3 RBC/hpf   Bacteria, UA FEW  (*) RARE  LIPASE, BLOOD      Result Value Ref Range   Lipase 30  11 - 59 U/L  TROPONIN I      Result Value Ref Range   Troponin I <0.30  <0.30 ng/mL  POC URINE PREG, ED      Result Value Ref Range   Preg Test, Ur NEGATIVE  NEGATIVE   Ct Abdomen Pelvis W Contrast  09/21/2014   CLINICAL DATA:  Left flank pain and left upper quadrant pain with tenderness to touch. Symptoms have been present for 4 days.  EXAM: CT ABDOMEN AND PELVIS WITH CONTRAST  TECHNIQUE: Multidetector CT imaging of the abdomen and pelvis was performed using the standard protocol following bolus administration of intravenous contrast.  CONTRAST:  OMNIPAQUE IOHEXOL 300 MG/ML  SOLN  COMPARISON:  CT scan dated 03/29/2014  FINDINGS: There is a 7.5 mm stone in the distal right ureter approximately 3 cm above the ureterovesical junction with no dilatation of the right ureter. There are at least 7 other stones in right kidney. The kidney appears normal.  Gallbladder has been removed. Biliary tree is normal. Diffuse hepatic steatosis without focal lesion. Spleen, pancreas, and adrenal glands are normal. The bowel is normal.  There is a 2.2 cm cyst on the left ovary. Right ovary is normal. The uterine fundus is not inhomogeneous,  possibly representing a uterine fibroid but this is nonspecific. Overall size of the uterus has not changed since the prior study of 06/23/2012.  IMPRESSION: 1. 7.5 mm stone in the distal right ureter without obstruction. Multiple right renal calculi. 2. 2.2 cm cyst on the left ovary. 3. Hepatic steatosis. 4. Inhomogeneous in uterine fundus suggestive of but not diagnostic for a uterine fibroid.   Electronically Signed   By: Geanie Cooley M.D.   On: 09/21/2014 13:23     ED Course  Procedures (including critical care time) Labs Review Labs Reviewed  URINALYSIS, ROUTINE W REFLEX MICROSCOPIC - Abnormal; Notable for the following:    Hgb urine dipstick TRACE (*)    Leukocytes, UA SMALL (*)    All other  components within normal limits  COMPREHENSIVE METABOLIC PANEL - Abnormal; Notable for the following:    Glucose, Bld 172 (*)    Alkaline Phosphatase 124 (*)    All other components within normal limits  URINE MICROSCOPIC-ADD ON - Abnormal; Notable for the following:    Bacteria, UA FEW (*)    All other components within normal limits  URINE CULTURE  CBC WITH DIFFERENTIAL  LIPASE, BLOOD  TROPONIN I  POC URINE PREG, ED    Imaging Review Ct Abdomen Pelvis W Contrast  09/21/2014   CLINICAL DATA:  Left flank pain and left upper quadrant pain with tenderness to touch. Symptoms have been present for 4 days.  EXAM: CT ABDOMEN AND PELVIS WITH CONTRAST  TECHNIQUE: Multidetector CT imaging of the abdomen and pelvis was performed using the standard protocol following bolus administration of intravenous contrast.  CONTRAST:  OMNIPAQUE IOHEXOL 300 MG/ML  SOLN  COMPARISON:  CT scan dated 03/29/2014  FINDINGS: There is a 7.5 mm stone in the distal right ureter approximately 3 cm above the ureterovesical junction with no dilatation of the right ureter. There are at least 7 other stones in right kidney. The kidney appears normal.  Gallbladder has been removed. Biliary tree is normal. Diffuse hepatic steatosis without focal lesion. Spleen, pancreas, and adrenal glands are normal. The bowel is normal.  There is a 2.2 cm cyst on the left ovary. Right ovary is normal. The uterine fundus is not inhomogeneous, possibly representing a uterine fibroid but this is nonspecific. Overall size of the uterus has not changed since the prior study of 06/23/2012.  IMPRESSION: 1. 7.5 mm stone in the distal right ureter without obstruction. Multiple right renal calculi. 2. 2.2 cm cyst on the left ovary. 3. Hepatic steatosis. 4. Inhomogeneous in uterine fundus suggestive of but not diagnostic for a uterine fibroid.   Electronically Signed   By: Geanie Cooley M.D.   On: 09/21/2014 13:23     EKG Interpretation   Date/Time:   Tuesday September 21 2014 15:22:33 EDT Ventricular Rate:  68 PR Interval:  155 QRS Duration: 81 QT Interval:  396 QTC Calculation: 421 R Axis:   45 Text Interpretation:  Sinus rhythm Low voltage, precordial leads Confirmed  by WARD,  DO, KRISTEN [phone]) on 09/21/2014 3:32:51 PM      Date: 09/21/2014  Rate: 68  Rhythm: normal sinus rhythm  QRS Axis: normal  Intervals: normal  ST/T Wave abnormalities: normal  Conduction Disutrbances:none  Narrative Interpretation:   Old EKG Reviewed: unchanged    MDM   Final diagnoses:  Kidney stone on right side  Bacteriuria    Patients labs and/or radiological studies were viewed and considered during the medical decision making and disposition process. CT  findings were reviewed with patient.  She endorses that with her last kidney stone passage her pain was in the opposite flank.  She states she has had no pain relief with the Dilaudid 1 mg IV doses x2.  Patient was discussed with Dr. Elesa Massed.  We will give a dose of Toradol 30 mg IV and reassess.  Also discussed with Dr. Retta Diones with urology who reviewed CT findings.  This 7.5 mm stone in longitudinal dimension, 5 mm across in diameter, so the stone may still pass without intervention.  No hydronephrosis, outpatient followup is reasonable if we can get patient symptom-free.  Pt given toradol IV 30 mg. Pt still 8/10 pain, reports never got below 8/10 with the dilaudid injections.  She states she was told by prior urologists she can never pass a stone - needs intervention. She cannot elaborate on the reason for this.  Call back to Dr. Retta Diones  With results of toradol.  He will see pt in ed for further management.    Burgess Amor, PA-C 09/21/14 1649

## 2014-09-21 NOTE — ED Notes (Signed)
Gave pt a strainer

## 2014-09-23 LAB — URINE CULTURE

## 2014-09-24 ENCOUNTER — Other Ambulatory Visit: Payer: Self-pay

## 2014-10-16 ENCOUNTER — Emergency Department (HOSPITAL_COMMUNITY): Payer: Managed Care, Other (non HMO)

## 2014-10-16 ENCOUNTER — Emergency Department (HOSPITAL_COMMUNITY)
Admission: EM | Admit: 2014-10-16 | Discharge: 2014-10-16 | Disposition: A | Discharge status: Home / Self Care | Payer: Managed Care, Other (non HMO) | Attending: Emergency Medicine | Admitting: Emergency Medicine

## 2014-10-16 ENCOUNTER — Encounter (HOSPITAL_COMMUNITY): Payer: Self-pay | Admitting: *Deleted

## 2014-10-16 DIAGNOSIS — R079 Chest pain, unspecified: Secondary | ICD-10-CM

## 2014-10-16 DIAGNOSIS — Z880 Allergy status to penicillin: Secondary | ICD-10-CM | POA: Insufficient documentation

## 2014-10-16 DIAGNOSIS — Z794 Long term (current) use of insulin: Secondary | ICD-10-CM | POA: Insufficient documentation

## 2014-10-16 DIAGNOSIS — E119 Type 2 diabetes mellitus without complications: Secondary | ICD-10-CM | POA: Insufficient documentation

## 2014-10-16 DIAGNOSIS — Z87442 Personal history of urinary calculi: Secondary | ICD-10-CM | POA: Insufficient documentation

## 2014-10-16 DIAGNOSIS — I10 Essential (primary) hypertension: Secondary | ICD-10-CM | POA: Insufficient documentation

## 2014-10-16 DIAGNOSIS — Z79899 Other long term (current) drug therapy: Secondary | ICD-10-CM | POA: Diagnosis not present

## 2014-10-16 DIAGNOSIS — E780 Pure hypercholesterolemia: Secondary | ICD-10-CM | POA: Diagnosis not present

## 2014-10-16 LAB — CBC WITH DIFFERENTIAL/PLATELET
BASOS PCT: 0 % (ref 0–1)
Basophils Absolute: 0 10*3/uL (ref 0.0–0.1)
Eosinophils Absolute: 0.2 10*3/uL (ref 0.0–0.7)
Eosinophils Relative: 3 % (ref 0–5)
HCT: 39.3 % (ref 36.0–46.0)
Hemoglobin: 13.6 g/dL (ref 12.0–15.0)
Lymphocytes Relative: 37 % (ref 12–46)
Lymphs Abs: 2.1 10*3/uL (ref 0.7–4.0)
MCH: 28.9 pg (ref 26.0–34.0)
MCHC: 34.6 g/dL (ref 30.0–36.0)
MCV: 83.6 fL (ref 78.0–100.0)
MONO ABS: 0.4 10*3/uL (ref 0.1–1.0)
Monocytes Relative: 7 % (ref 3–12)
NEUTROS ABS: 2.9 10*3/uL (ref 1.7–7.7)
NEUTROS PCT: 53 % (ref 43–77)
PLATELETS: 249 10*3/uL (ref 150–400)
RBC: 4.7 MIL/uL (ref 3.87–5.11)
RDW: 13.2 % (ref 11.5–15.5)
WBC: 5.5 10*3/uL (ref 4.0–10.5)

## 2014-10-16 LAB — BASIC METABOLIC PANEL
ANION GAP: 10 (ref 5–15)
BUN: 11 mg/dL (ref 6–23)
CALCIUM: 8.9 mg/dL (ref 8.4–10.5)
CO2: 27 mEq/L (ref 19–32)
Chloride: 103 mEq/L (ref 96–112)
Creatinine, Ser: 0.62 mg/dL (ref 0.50–1.10)
Glucose, Bld: 212 mg/dL — ABNORMAL HIGH (ref 70–99)
Potassium: 3.8 mEq/L (ref 3.7–5.3)
SODIUM: 140 meq/L (ref 137–147)

## 2014-10-16 LAB — TROPONIN I: Troponin I: <0.3 ng/mL (ref <0.30)

## 2014-10-16 MED ORDER — HYDROMORPHONE HCL 1 MG/ML IJ SOLN
0.5000 mg | Freq: Once | INTRAMUSCULAR | Status: AC
  Administered 2014-10-16: 0.5 mg via INTRAVENOUS
  Filled 2014-10-16: qty 1

## 2014-10-16 MED ORDER — KETOROLAC TROMETHAMINE 30 MG/ML IJ SOLN
30.0000 mg | Freq: Once | INTRAMUSCULAR | Status: AC
  Administered 2014-10-16: 30 mg via INTRAVENOUS
  Filled 2014-10-16: qty 1

## 2014-10-16 MED ORDER — MORPHINE SULFATE 4 MG/ML IJ SOLN: 4.0000 mg | INTRAMUSCULAR | Status: DC | PRN

## 2014-10-16 MED ORDER — NAPROXEN 500 MG PO TABS: 500.0000 mg | ORAL_TABLET | Freq: Two times a day (BID) | ORAL | Status: DC

## 2014-10-16 MED ORDER — ONDANSETRON HCL 4 MG/2ML IJ SOLN
4.0000 mg | Freq: Once | INTRAMUSCULAR | Status: AC
  Administered 2014-10-16: 4 mg via INTRAVENOUS
  Filled 2014-10-16: qty 2

## 2014-10-16 NOTE — Discharge Instructions (Signed)
Follow-up with your primary care physician about the ongoing back pain. Naproxen for chest wall pain.  Chest Pain (Nonspecific) It is often hard to give a specific diagnosis for the cause of chest pain. There is always a chance that your pain could be related to something serious, such as a heart attack or a blood clot in the lungs. You need to follow up with your health care provider for further evaluation. CAUSES   Heartburn.  Pneumonia or bronchitis.  Anxiety or stress.  Inflammation around your heart (pericarditis) or lung (pleuritis or pleurisy).  A blood clot in the lung.  A collapsed lung (pneumothorax). It can develop suddenly on its own (spontaneous pneumothorax) or from trauma to the chest.  Shingles infection (herpes zoster virus). The chest wall is composed of bones, muscles, and cartilage. Any of these can be the source of the pain.  The bones can be bruised by injury.  The muscles or cartilage can be strained by coughing or overwork.  The cartilage can be affected by inflammation and become sore (costochondritis). DIAGNOSIS  Lab tests or other studies may be needed to find the cause of your pain. Your health care provider may have you take a test called an ambulatory electrocardiogram (ECG). An ECG records your heartbeat patterns over a 24-hour period. You may also have other tests, such as:  Transthoracic echocardiogram (TTE). During echocardiography, sound waves are used to evaluate how blood flows through your heart.  Transesophageal echocardiogram (TEE).  Cardiac monitoring. This allows your health care provider to monitor your heart rate and rhythm in real time.  Holter monitor. This is a portable device that records your heartbeat and can help diagnose heart arrhythmias. It allows your health care provider to track your heart activity for several days, if needed.  Stress tests by exercise or by giving medicine that makes the heart beat faster. TREATMENT    Treatment depends on what may be causing your chest pain. Treatment may include:  Acid blockers for heartburn.  Anti-inflammatory medicine.  Pain medicine for inflammatory conditions.  Antibiotics if an infection is present.  You may be advised to change lifestyle habits. This includes stopping smoking and avoiding alcohol, caffeine, and chocolate.  You may be advised to keep your head raised (elevated) when sleeping. This reduces the chance of acid going backward from your stomach into your esophagus. Most of the time, nonspecific chest pain will improve within 2-3 days with rest and mild pain medicine.  HOME CARE INSTRUCTIONS   If antibiotics were prescribed, take them as directed. Finish them even if you start to feel better.  For the next few days, avoid physical activities that bring on chest pain. Continue physical activities as directed.  Do not use any tobacco products, including cigarettes, chewing tobacco, or electronic cigarettes.  Avoid drinking alcohol.  Only take medicine as directed by your health care provider.  Follow your health care provider's suggestions for further testing if your chest pain does not go away.  Keep any follow-up appointments you made. If you do not go to an appointment, you could develop lasting (chronic) problems with pain. If there is any problem keeping an appointment, call to reschedule. SEEK MEDICAL CARE IF:   Your chest pain does not go away, even after treatment.  You have a rash with blisters on your chest.  You have a fever. SEEK IMMEDIATE MEDICAL CARE IF:   You have increased chest pain or pain that spreads to your arm, neck, jaw, back,  or abdomen.  You have shortness of breath.  You have an increasing cough, or you cough up blood.  You have severe back or abdominal pain.  You feel nauseous or vomit.  You have severe weakness.  You faint.  You have chills. This is an emergency. Do not wait to see if the pain will  go away. Get medical help at once. Call your local emergency services (911 in [place] Do not drive yourself to the hospital. MAKE SURE YOU:   Understand these instructions.  Will watch your condition.  Will get help right away if you are not doing well or get worse. Document Released: 09/05/2005 Document Revised: 12/01/2013 Document Reviewed: 07/01/2008 [facility] Patient Information 2015 [place], [place]. This information is not intended to replace advice given to you by your health care provider. Make sure you discuss any questions you have with your health care provider.

## 2014-10-16 NOTE — ED Notes (Signed)
Pt states she has a chest tightness that started 4 days ago that now moves into her back. Pt states when the back pain started her legs hurt as well. States she has had some SOB and dizziness. Pain worse with movement.

## 2014-10-16 NOTE — ED Provider Notes (Signed)
CSN: [id number]     Arrival date & time 10/16/14  2119 History  This chart was scribe for Rolland Porter, MD by Angelene Giovanni, ED Scribe. The patient was seen in room APA19/APA19 and the patient's care was started at 8:05 AM.    Chief Complaint  Patient presents with  . Chest Pain   The history is provided by the patient. No language interpreter was used.   HPI Comments: [name] is a 48 y.o. female who presents to the Emergency Department complaining of an intermittent 8/10 left CP onset 2 nights ago. She reports associated lower middle back pain that intermittently radiates to both of her legs. She also reports associated SOB and acute coughing. She also adds slight pain in her chest when she breathes. She reports having back surgery in the 90's for a disc problem. She also reports chronic back pain for the last few months but this reported back pain is worse than usual. She states that she had an Angioplasty about 2 years ago. She reports diabetes, HTN, and HLD. She states that she uses an insulin pump. She denies smoking.  She denies having passed her kidney stones.   PCP: Dr. Margo Aye  Past Medical History  Diagnosis Date  . Diabetes mellitus     insulin pump 2013  . Neuropathy   . Hypertension   . Hypercholesteremia   . Shortness of breath   . Kidney stone    Past Surgical History  Procedure Laterality Date  . Cesarean section  1994;2000    [facility]  . Cholecystectomy  1992  . Sinus sergery  1988    [place]  . Foot surgery Right March, 2013    [facility]-removal of bone spur  . Hysteroscopy with thermachoice  04/25/2012    Procedure: HYSTEROSCOPY WITH THERMACHOICE;  Surgeon: Lazaro Arms, MD;  Location: [facility];  Service: Gynecology;  Laterality: N/A;  total therapy time= 11 minutes 42 seconds; 34 ml D5w  in and 34 ml D5w out; temp =87 degrees F  . Cystoscopy w/ ureteral stent placement  05/08/2012    Procedure: CYSTOSCOPY WITH RETROGRADE PYELOGRAM/URETERAL STENT  PLACEMENT;  Surgeon: Ky Barban, MD;  Location: [facility];  Service: Urology;  Laterality: Right;  . Umbilical hernia repair N/A 03/25/2014    Procedure: UMBILICAL HERNIA REPAIR;  Surgeon: Marlane Hatcher, MD;  Location: [facility];  Service: General;  Laterality: N/A;  . Uterine ablation     Family History  Problem Relation Age of Onset  . Heart disease    . Cancer    . Diabetes    . Achalasia    . Anesthesia problems Neg Hx   . Hypotension Neg Hx   . Malignant hyperthermia Neg Hx   . Pseudochol deficiency Neg Hx   . Diabetes Mother   . Depression Paternal Grandmother   . Depression Paternal Grandfather    History  Substance Use Topics  . Smoking status: Never Smoker   . Smokeless tobacco: Never Used  . Alcohol Use: No   OB History    No data available     Review of Systems  Constitutional: Negative for fever, chills, diaphoresis, appetite change and fatigue.  HENT: Negative for mouth sores, sore throat and trouble swallowing.   Eyes: Negative for visual disturbance.  Respiratory: Negative for cough, chest tightness, shortness of breath and wheezing.   Cardiovascular: Negative for chest pain.  Gastrointestinal: Negative for nausea, vomiting, abdominal pain, diarrhea and abdominal distention.  Endocrine:  Negative for polydipsia, polyphagia and polyuria.  Genitourinary: Negative for dysuria, frequency and hematuria.  Musculoskeletal: Negative for gait problem.  Skin: Negative for color change, pallor and rash.  Neurological: Negative for dizziness, syncope, light-headedness and headaches.  Hematological: Does not bruise/bleed easily.  Psychiatric/Behavioral: Negative for behavioral problems and confusion.      Allergies  Ciprofloxacin; Penicillins; Codeine; Morphine; Sulfonamide derivatives; and Vancomycin  Home Medications   Prior to Admission medications   Medication Sig Start Date End Date Taking? Authorizing Provider  Insulin Human (INSULIN PUMP) SOLN Inject  into the skin continuous. insulin lispro (HUMALOG) 100 UNIT/ML injection   Yes Historical Provider, MD  INVOKANA 100 MG TABS Take 1 tablet by mouth daily. 09/13/14  Yes Historical Provider, MD  lisinopril (PRINIVIL,ZESTRIL) 10 MG tablet Take 1 tablet by mouth daily. 09/07/14  Yes Historical Provider, MD  LORazepam (ATIVAN) 0.5 MG tablet Take 1 tablet by mouth 2 (two) times daily as needed for anxiety.  09/01/14  Yes Historical Provider, MD  pregabalin (LYRICA) 50 MG capsule Take 100 mg by mouth 2 (two) times daily.   Yes Historical Provider, MD  simvastatin (ZOCOR) 10 MG tablet Take 1 tablet by mouth every evening. 09/07/14  Yes Historical Provider, MD  acetaminophen (TYLENOL) 500 MG tablet Take 1,000 mg by mouth every 6 (six) hours as needed for mild pain.    Historical Provider, MD  glucagon (GLUCAGON EMERGENCY) 1 MG injection Inject in the muscle as needed for hypoglycemia 12/15/12   Carlus Pavlov, MD  naproxen (NAPROSYN) 500 MG tablet Take 1 tablet (500 mg total) by mouth 2 (two) times daily. 10/16/14   Rolland Porter, MD   BP 123/79 mmHg  Pulse 76  Temp(Src) 97.8 F (36.6 C) (Oral)  Resp 15  SpO2 98% Physical Exam  Constitutional: She is oriented to person, place, and time. She appears well-developed and well-nourished. No distress.  Obese  HENT:  Head: Normocephalic.  Eyes: Conjunctivae are normal. Pupils are equal, round, and reactive to light. No scleral icterus.  Neck: Normal range of motion. Neck supple. No thyromegaly present.  Cardiovascular: Normal rate and regular rhythm.  Exam reveals no gallop and no friction rub.   No murmur heard. Pulmonary/Chest: Effort normal and breath sounds normal. No respiratory distress. She has no wheezes. She has no rales. She exhibits tenderness.  Tenderness in LU lateral chest   Abdominal: Soft. Bowel sounds are normal. She exhibits no distension. There is no tenderness. There is no rebound.  Musculoskeletal: Normal range of motion. She exhibits  tenderness.  Low lumbar tenderness  Neurological: She is alert and oriented to person, place, and time.  Skin: Skin is warm and dry. No rash noted.  Psychiatric: She has a normal mood and affect. Her behavior is normal.    ED Course  Procedures (including critical care time)\ DIAGNOSTIC STUDIES: Oxygen Saturation is 98% on RA, normal by my interpretation.    COORDINATION OF CARE: 8:15 AM- Pt advised of plan for treatment and pt agrees.  Labs Review Labs Reviewed  BASIC METABOLIC PANEL - Abnormal; Notable for the following:    Glucose, Bld 212 (*)    All other components within normal limits  CBC WITH DIFFERENTIAL  TROPONIN I    Imaging Review Dg Chest 2 View  10/16/2014   CLINICAL DATA:  Chest and back pain for 1 week  EXAM: CHEST  2 VIEW  COMPARISON:  11/18/2012  FINDINGS: Lungs are hypoaerated with crowding of the bronchovascular markings. Heart size upper limits  of normal. No focal pulmonary opacity. No pleural effusion. No acute osseous finding. Cholecystectomy clips are noted.  IMPRESSION: Low volumes with crowding of the bronchovascular markings but no focal acute finding.   Electronically Signed   By: Christiana Pellant M.D.   On: 10/16/2014 09:29     EKG Interpretation   Date/Time:  Saturday October 16 2014 07:55:51 EST Ventricular Rate:  73 PR Interval:  152 QRS Duration: 82 QT Interval:  389 QTC Calculation: 429 R Axis:   57 Text Interpretation:  Sinus rhythm Low voltage, precordial leads Confirmed  by Fayrene Fearing  MD, Vivianne Carles ([id number]) on 10/16/2014 9:36:13 AM      MDM   Final diagnoses:  Chest pain    Perc Negative. Wells Negative HEART score 3 (Age 29, >2 risks).  Normal Angiogram 2014  ANGIOGRAPHIC RESULTS:   1. Left main; normal  2. LAD; normal 3. Left circumflex; normal.  4. Right coronary artery; dominant and normal 5. Left ventriculography; RAO left ventriculogram was performed using  25 mL of Visipaque dye at 12 mL/second. The overall LVEF estimated   60 % Without wall motion abnormalities   She is safe for outpatient treatment without additional studies.  Follow up with her primary care physician regarding her ongoing chest, and back pain.     I personally performed the services described in this documentation, which was scribed in my presence. The recorded information has been reviewed and is accurate.    Rolland Porter, MD 10/16/14 [phone]

## 2014-11-18 ENCOUNTER — Encounter (HOSPITAL_COMMUNITY): Payer: Self-pay | Admitting: Cardiovascular Disease

## 2014-12-06 ENCOUNTER — Emergency Department (HOSPITAL_COMMUNITY)
Admission: EM | Admit: 2014-12-06 | Discharge: 2014-12-06 | Disposition: A | Discharge status: Home / Self Care | Payer: Managed Care, Other (non HMO) | Attending: Emergency Medicine | Admitting: Emergency Medicine

## 2014-12-06 ENCOUNTER — Emergency Department (HOSPITAL_COMMUNITY): Payer: Managed Care, Other (non HMO)

## 2014-12-06 ENCOUNTER — Encounter (HOSPITAL_COMMUNITY): Payer: Self-pay | Admitting: Emergency Medicine

## 2014-12-06 DIAGNOSIS — Z791 Long term (current) use of non-steroidal anti-inflammatories (NSAID): Secondary | ICD-10-CM | POA: Diagnosis not present

## 2014-12-06 DIAGNOSIS — Z79899 Other long term (current) drug therapy: Secondary | ICD-10-CM | POA: Diagnosis not present

## 2014-12-06 DIAGNOSIS — E119 Type 2 diabetes mellitus without complications: Secondary | ICD-10-CM | POA: Diagnosis not present

## 2014-12-06 DIAGNOSIS — Z794 Long term (current) use of insulin: Secondary | ICD-10-CM | POA: Insufficient documentation

## 2014-12-06 DIAGNOSIS — E785 Hyperlipidemia, unspecified: Secondary | ICD-10-CM | POA: Diagnosis not present

## 2014-12-06 DIAGNOSIS — I10 Essential (primary) hypertension: Secondary | ICD-10-CM | POA: Diagnosis not present

## 2014-12-06 DIAGNOSIS — Z8669 Personal history of other diseases of the nervous system and sense organs: Secondary | ICD-10-CM | POA: Diagnosis not present

## 2014-12-06 DIAGNOSIS — Z87442 Personal history of urinary calculi: Secondary | ICD-10-CM | POA: Diagnosis not present

## 2014-12-06 DIAGNOSIS — R079 Chest pain, unspecified: Secondary | ICD-10-CM | POA: Insufficient documentation

## 2014-12-06 LAB — BASIC METABOLIC PANEL
ANION GAP: 8 (ref 5–15)
BUN: 12 mg/dL (ref 6–23)
CO2: 25 mmol/L (ref 19–32)
Calcium: 8.5 mg/dL (ref 8.4–10.5)
Chloride: 100 mEq/L (ref 96–112)
Creatinine, Ser: 0.65 mg/dL (ref 0.50–1.10)
Glucose, Bld: 333 mg/dL — ABNORMAL HIGH (ref 70–99)
Potassium: 3.9 mmol/L (ref 3.5–5.1)
SODIUM: 133 mmol/L — AB (ref 135–145)

## 2014-12-06 LAB — CBC WITH DIFFERENTIAL/PLATELET
BASOS ABS: 0 10*3/uL (ref 0.0–0.1)
Basophils Relative: 1 % (ref 0–1)
EOS ABS: 0.2 10*3/uL (ref 0.0–0.7)
Eosinophils Relative: 3 % (ref 0–5)
HCT: 40.5 % (ref 36.0–46.0)
Hemoglobin: 13.4 g/dL (ref 12.0–15.0)
LYMPHS PCT: 31 % (ref 12–46)
Lymphs Abs: 2.3 10*3/uL (ref 0.7–4.0)
MCH: 28.5 pg (ref 26.0–34.0)
MCHC: 33.1 g/dL (ref 30.0–36.0)
MCV: 86.2 fL (ref 78.0–100.0)
Monocytes Absolute: 0.6 10*3/uL (ref 0.1–1.0)
Monocytes Relative: 9 % (ref 3–12)
Neutro Abs: 4.2 10*3/uL (ref 1.7–7.7)
Neutrophils Relative %: 56 % (ref 43–77)
PLATELETS: 293 10*3/uL (ref 150–400)
RBC: 4.7 MIL/uL (ref 3.87–5.11)
RDW: 13.1 % (ref 11.5–15.5)
WBC: 7.5 10*3/uL (ref 4.0–10.5)

## 2014-12-06 LAB — CBG MONITORING, ED
GLUCOSE-CAPILLARY: 262 mg/dL — AB (ref 70–99)
GLUCOSE-CAPILLARY: 228 mg/dL — AB (ref 70–99)
Glucose-Capillary: 316 mg/dL — ABNORMAL HIGH (ref 70–99)
Glucose-Capillary: 228 mg/dL — ABNORMAL HIGH (ref 70–99)

## 2014-12-06 LAB — TROPONIN I: Troponin I: <0.03 ng/mL (ref <0.031)

## 2014-12-06 LAB — D-DIMER, QUANTITATIVE (NOT AT ARMC): D DIMER QUANT: 0.43 ug{FEU}/mL (ref 0.00–0.48)

## 2014-12-06 MED ORDER — HYDROMORPHONE HCL 1 MG/ML IJ SOLN
1.0000 mg | Freq: Once | INTRAMUSCULAR | Status: AC
  Administered 2014-12-06: 1 mg via INTRAVENOUS
  Filled 2014-12-06: qty 1

## 2014-12-06 MED ORDER — TRAMADOL HCL 50 MG PO TABS
50.0000 mg | ORAL_TABLET | Freq: Four times a day (QID) | ORAL | Status: DC | PRN
Start: 2014-12-06 — End: 2015-05-17

## 2014-12-06 MED ORDER — INSULIN PUMP
SUBCUTANEOUS | Status: DC
  Administered 2014-12-06: 12:00:00 via SUBCUTANEOUS
  Filled 2014-12-06: qty 1

## 2014-12-06 MED ORDER — KETOROLAC TROMETHAMINE 30 MG/ML IJ SOLN
30.0000 mg | Freq: Once | INTRAMUSCULAR | Status: AC
  Administered 2014-12-06: 30 mg via INTRAVENOUS
  Filled 2014-12-06: qty 1

## 2014-12-06 MED ORDER — NITROGLYCERIN 0.4 MG SL SUBL
0.4000 mg | SUBLINGUAL_TABLET | SUBLINGUAL | Status: AC | PRN
  Administered 2014-12-06 (×3): 0.4 mg via SUBLINGUAL
  Filled 2014-12-06 (×2): qty 1

## 2014-12-06 MED ORDER — ONDANSETRON HCL 4 MG/2ML IJ SOLN
4.0000 mg | Freq: Once | INTRAMUSCULAR | Status: AC
  Administered 2014-12-06: 4 mg via INTRAVENOUS
  Filled 2014-12-06: qty 2

## 2014-12-06 MED ORDER — FENTANYL CITRATE 0.05 MG/ML IJ SOLN
50.0000 ug | Freq: Once | INTRAMUSCULAR | Status: AC
  Administered 2014-12-06: 50 ug via INTRAVENOUS
  Filled 2014-12-06: qty 2

## 2014-12-06 MED ORDER — ASPIRIN 81 MG PO CHEW
324.0000 mg | CHEWABLE_TABLET | Freq: Once | ORAL | Status: AC
  Administered 2014-12-06: 324 mg via ORAL
  Filled 2014-12-06: qty 4

## 2014-12-06 NOTE — Discharge Instructions (Signed)

## 2014-12-06 NOTE — ED Provider Notes (Signed)
This chart was scribed for [name] Maw Nil Bolser, DO by Arlan Organ, ED Scribe. This patient was seen in room APA08/APA08 and the patient's care was started 9:36 AM.   TIME SEEN: 9:36 AM   CHIEF COMPLAINT:  Chief Complaint  Patient presents with  . Chest Pain     HPI:  HPI Comments: [name] is a 48 y.o. female with a PMHx of DM, HTN, hypercholesteremia, and neuropathy who presents to the Emergency Department complaining of chest pain that started at 7 AM that radiates to the L arm onset this morning at approximately 7 AM after waking from sleep. Pain described as "someone sitting on my chest". No aggravating or alleviating factors at this time. She also reports nausea, leg pain (onset several weeks), and mild dizziness. No recent long distance travel. No recent surgery or hospitalization. No prior history of PE or DVT. Last cardiac catheterization in 2014 that was completely normal. No recent stress test. Patient has had a dry cough. No fever. Pain is been constant since 7 AM. Reports a similar episode in November and at that time had negative cardiac enzymes and was discharged home. She has not followed up with her PCP.  ROS: See HPI Constitutional: no fever  Eyes: no drainage  ENT: no runny nose   Cardiovascular:  Positive for chest pain  Resp: no SOB  GI: no vomiting. Positive for nausea GU: no dysuria Integumentary: no rash  Allergy: no hives  Musculoskeletal: no leg swelling. Positive for arthralgias  Neurological: no slurred speech ROS otherwise negative  PAST MEDICAL HISTORY/PAST SURGICAL HISTORY:  Past Medical History  Diagnosis Date  . Diabetes mellitus     insulin pump 2013  . Neuropathy   . Hypertension   . Hypercholesteremia   . Shortness of breath   . Kidney stone     MEDICATIONS:  Prior to Admission medications   Medication Sig Start Date End Date Taking? Authorizing Provider  acetaminophen (TYLENOL) 500 MG tablet Take 1,000 mg by mouth every 6 (six) hours as  needed for mild pain.    Historical Provider, MD  glucagon (GLUCAGON EMERGENCY) 1 MG injection Inject in the muscle as needed for hypoglycemia 12/15/12   Carlus Pavlov, MD  Insulin Human (INSULIN PUMP) SOLN Inject into the skin continuous. insulin lispro (HUMALOG) 100 UNIT/ML injection    Historical Provider, MD  INVOKANA 100 MG TABS Take 1 tablet by mouth daily. 09/13/14   Historical Provider, MD  lisinopril (PRINIVIL,ZESTRIL) 10 MG tablet Take 1 tablet by mouth daily. 09/07/14   Historical Provider, MD  LORazepam (ATIVAN) 0.5 MG tablet Take 1 tablet by mouth 2 (two) times daily as needed for anxiety.  09/01/14   Historical Provider, MD  naproxen (NAPROSYN) 500 MG tablet Take 1 tablet (500 mg total) by mouth 2 (two) times daily. 10/16/14   Rolland Porter, MD  pregabalin (LYRICA) 50 MG capsule Take 100 mg by mouth 2 (two) times daily.    Historical Provider, MD  simvastatin (ZOCOR) 10 MG tablet Take 1 tablet by mouth every evening. 09/07/14   Historical Provider, MD    ALLERGIES:  Allergies  Allergen Reactions  . Ciprofloxacin Hives and Swelling  . Penicillins Anaphylaxis and Rash  . Codeine Other (See Comments)    REACTION:  had hallicunations  . Morphine Nausea And Vomiting and Other (See Comments)    REACTION:   recieved in ED due to [facility] and had multple doses - gave visual hallucinations and vomitting.  . Sulfonamide Derivatives  Other (See Comments)    REACTION: Unsure - childhood allergy  . Vancomycin Itching    SOCIAL HISTORY:  History  Substance Use Topics  . Smoking status: Never Smoker   . Smokeless tobacco: Never Used  . Alcohol Use: No    FAMILY HISTORY: Family History  Problem Relation Age of Onset  . Heart disease    . Cancer    . Diabetes    . Achalasia    . Anesthesia problems Neg Hx   . Hypotension Neg Hx   . Malignant hyperthermia Neg Hx   . Pseudochol deficiency Neg Hx   . Diabetes Mother   . Depression Paternal Grandmother   . Depression Paternal  Grandfather     EXAM: BP 149/89 mmHg  Pulse 89  Temp(Src) 98.2 F (36.8 C) (Oral)  Resp 18  Ht 4\' 9"  (1.448 m)  Wt 175 lb (79.379 kg)  BMI 37.86 kg/m2  SpO2 99% CONSTITUTIONAL: Alert and oriented and responds appropriately to questions. Well-appearing; well-nourished HEAD: Normocephalic EYES: Conjunctivae clear, PERRL ENT: normal nose; no rhinorrhea; moist mucous membranes; pharynx without lesions noted NECK: Supple, no meningismus, no LAD  CARD: RRR; S1 and S2 appreciated; no murmurs, no clicks, no rubs, no gallops RESP: Normal chest excursion without splinting or tachypnea; breath sounds clear and equal bilaterally; no wheezes, no rhonchi, no rales, no hypoxia or respiratory distress, speaking full sentences, patient is tender to palpation over her left chest wall reports this reproduces her pain. ABD/GI: Normal bowel sounds; non-distended; soft, non-tender, no rebound, no guarding BACK:  The back appears normal and is non-tender to palpation, there is no CVA tenderness EXT: Normal ROM in all joints; non-tender to palpation; no edema; normal capillary refill; no cyanosis; no calf tenderness or swelling; no effusion  SKIN: Normal color for age and race; warm NEURO: Moves all extremities equally PSYCH: The patient's mood and manner are appropriate. Grooming and personal hygiene are appropriate.  MEDICAL DECISION MAKING: Patient here with chest pain. She had a negative cardiac catheterization January 2014. EKG shows no new ischemic changes. We'll obtain cardiac labs, give aspirin and nitroglycerin.  ED PROGRESS: 11:00 AM  Pt's labs have been unremarkable. Negative troponin. Negative d-dimer. Chest x-ray clear. Patient reports she has had a dry cough for several days and thinks some of this may be from coughing so much. Given she has had a recent negative cardiac catheterization I have low suspicion for cardiac chest pain. We'll obtain a second troponin. If negative, anticipate discharge  home with outpatient follow-up. She is comfortable with this plan. Denies any relief with nitroglycerin. We'll give fentanyl for pain.   Patient reports no relief of pain with fentanyl or nitroglycerin. She is requesting Dilaudid. Second troponin at 1 PM, 6 hours after the onset of symptoms is negative. I do not think that her pain is cardiac in nature as I'm able to reproduce it with palpation and she has had a recent negative catheterization. Have advised her to follow-up with her primary care physician. Discussed return precautions. She verbalized understanding and is comfortable with plan.   EKG Interpretation  Date/Time:  Monday December 06 2014 09:25:23 EST Ventricular Rate:  92 PR Interval:  150 QRS Duration: 80 QT Interval:  358 QTC Calculation: 442 R Axis:   58 Text Interpretation:  Normal sinus rhythm Cannot rule out Anterior infarct , age undetermined Abnormal ECG No significant change since last tracing Confirmed by Gerilynn Mccullars,  DO, Leylani Duley [phone]) on 12/06/2014 9:36:24 AM  I personally performed the services described in this documentation, which was scribed in my presence. The recorded information has been reviewed and is accurate.    [name] Maw Ziare Orrick, DO 12/06/14 [phone]

## 2014-12-06 NOTE — Progress Notes (Signed)
Inpatient Diabetes Program Recommendations  AACE/ADA: New Consensus Statement on Inpatient Glycemic Control (2013)  Target Ranges:  Prepandial:   less than 140 mg/dL      Peak postprandial:   less than 180 mg/dL (1-2 hours)      Critically ill patients:  140 - 180 mg/dL   Results for [name], [name] (MRN [id number]) as of 12/06/2014 11:22  Ref. Range 12/06/2014 09:50  Glucose-Capillary Latest Range: 70-99 mg/dL 287 (H)   Diabetes history: DM2 Outpatient Diabetes medications: insulin pump with Humalog insulin, Invokana 100 mg daily Current orders for Inpatient glycemic control: None  Inpatient Diabetes Program Recommendations Insulin pump: Noted patient uses an insulin pump as an outpatient. While in ED, please order insulin pump order set.  Thanks, Orlando Penner, RN, MSN, CCRN, CDE Diabetes Coordinator Inpatient Diabetes Program [phone] (Team Pager) [phone] ([facility] office) [phone] [facility] office)

## 2014-12-06 NOTE — ED Notes (Signed)
Pt reports chest pain since waking up this am. Pt reports called PCP and was sent here. Pt reports pain radiates to left arm. Pt also reports leg pain for last several weeks. Mild dyspnea noted in triage. Pt reports nausea as well.

## 2014-12-08 ENCOUNTER — Ambulatory Visit: Payer: Managed Care, Other (non HMO) | Admitting: Internal Medicine

## 2014-12-08 ENCOUNTER — Ambulatory Visit (INDEPENDENT_AMBULATORY_CARE_PROVIDER_SITE_OTHER): Payer: Managed Care, Other (non HMO) | Admitting: Cardiovascular Disease

## 2014-12-08 ENCOUNTER — Ambulatory Visit (INDEPENDENT_AMBULATORY_CARE_PROVIDER_SITE_OTHER): Payer: Managed Care, Other (non HMO) | Admitting: Internal Medicine

## 2014-12-08 ENCOUNTER — Encounter: Payer: Self-pay | Admitting: Cardiovascular Disease

## 2014-12-08 ENCOUNTER — Telehealth: Payer: Self-pay | Admitting: Cardiovascular Disease

## 2014-12-08 VITALS — BP 124/90 | HR 81 | Ht <= 58 in | Wt 234.2 lb

## 2014-12-08 VITALS — BP 128/88 | HR 89 | Ht <= 58 in | Wt 233.8 lb

## 2014-12-08 DIAGNOSIS — R002 Palpitations: Secondary | ICD-10-CM

## 2014-12-08 DIAGNOSIS — E785 Hyperlipidemia, unspecified: Secondary | ICD-10-CM

## 2014-12-08 DIAGNOSIS — I10 Essential (primary) hypertension: Secondary | ICD-10-CM

## 2014-12-08 DIAGNOSIS — G4733 Obstructive sleep apnea (adult) (pediatric): Secondary | ICD-10-CM | POA: Insufficient documentation

## 2014-12-08 DIAGNOSIS — G4730 Sleep apnea, unspecified: Secondary | ICD-10-CM

## 2014-12-08 DIAGNOSIS — R29818 Other symptoms and signs involving the nervous system: Secondary | ICD-10-CM

## 2014-12-08 DIAGNOSIS — R079 Chest pain, unspecified: Secondary | ICD-10-CM

## 2014-12-08 MED ORDER — METOPROLOL SUCCINATE ER 25 MG PO TB24: 25.0000 mg | ORAL_TABLET | Freq: Every day | ORAL | Status: DC

## 2014-12-08 MED ORDER — LISINOPRIL 20 MG PO TABS: 20.0000 mg | ORAL_TABLET | Freq: Every day | ORAL | Status: DC

## 2014-12-08 NOTE — Assessment & Plan Note (Signed)
Patient gives a classic history of obstructive sleep apnea with nocturnal snoring, daytime somnolence palpitations and atypical chest pain. I'm going to get a outpatient sleep study

## 2014-12-08 NOTE — Assessment & Plan Note (Signed)
The patient saw Dietrich Pates this morning in [place] and from there drove to [facility] for another appointment with me. She was complaining of chest pain radiating to her left upper extremity and jaw. I performed cardiac catheterization on her 12/11/12 which was entirely normal. I reassured her that her pain was noncardiac.

## 2014-12-08 NOTE — Telephone Encounter (Signed)
Closed encounter °

## 2014-12-08 NOTE — Assessment & Plan Note (Signed)
Complaints of palpitations. I'm going to add low-dose beta-blockade and get a 2 week event monitor.

## 2014-12-08 NOTE — Assessment & Plan Note (Signed)
History of hypertension with blood pressure today measured at 124/90. She is on lisinopril 10 mg a day. I'm going to increase this to 20 mg a day.

## 2014-12-08 NOTE — Patient Instructions (Addendum)
Your physician recommends that you schedule a follow-up appointment only as needed    Please get FASTING Lab work today     Thank you for choosing  [facility] !

## 2014-12-08 NOTE — Assessment & Plan Note (Signed)
History of hyperlipidemia on statin therapy followed by her PCP. 

## 2014-12-08 NOTE — Progress Notes (Signed)
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 Wehrenberg is a 48 year old morbidly overweight married Caucasian female mother of 2 children his husband also is a patient of mine. I last saw her in the office 12/05/12. She is a Lawyer. Risk factors include diabetes, hypertension and hyperlipidemia. I performed quite a catheterization on her 12/11/12 which is entirely normal. She does give symptoms compatible with obstructive sleep apnea. She complains of palpitations. She saw Dr. Dietrich Pates in the office this morning a retail complaints of atypical chest pain and she reassured her explaining this was most likely musculoskeletal.   Current Outpatient Prescriptions  Medication Sig Dispense Refill  . acetaminophen (TYLENOL) 500 MG tablet Take 1,000 mg by mouth every 6 (six) hours as needed for mild pain.    Marland Kitchen ALPRAZolam (XANAX) 0.5 MG tablet Take 1 tablet by mouth as needed.    Marland Kitchen aspirin 81 MG tablet Take 81 mg by mouth daily.    . COMBIPATCH 0.05-0.14 MG/DAY Place 1 patch onto the skin every 3 (three) days.    . DULoxetine (CYMBALTA) 20 MG capsule Take 20 mg by mouth daily.    Marland Kitchen gabapentin (NEURONTIN) 300 MG capsule Take 300-600 mg by mouth 3 (three) times daily. Patient takes 1 capsule in the morning and 1 capsule in the afternoon and 2 capsules at night    . glucagon (GLUCAGON EMERGENCY) 1 MG injection Inject in the muscle as needed for hypoglycemia 1 each 2  . Insulin Human (INSULIN PUMP) SOLN Inject into the skin continuous. insulin lispro (HUMALOG) 100 UNIT/ML injection    . INVOKANA 100 MG TABS Take 1 tablet by mouth daily.    Marland Kitchen lisinopril (PRINIVIL,ZESTRIL) 20 MG tablet Take 1 tablet (20 mg total) by mouth daily. 30 tablet 6  . LORazepam (ATIVAN) 0.5 MG tablet Take 1 tablet by mouth 2 (two) times daily as needed for anxiety.     Marland Kitchen LYRICA 150 MG capsule Take 1 tablet by mouth as  needed.    . meclizine (ANTIVERT) 25 MG tablet Take 1 tablet by mouth as needed.    . ondansetron (ZOFRAN) 4 MG tablet Take 1 tablet by mouth as needed.    . simvastatin (ZOCOR) 10 MG tablet Take 1 tablet by mouth every evening.    . traMADol (ULTRAM) 50 MG tablet Take 1 tablet (50 mg total) by mouth every 6 (six) hours as needed. 15 tablet 0  . metoprolol succinate (TOPROL XL) 25 MG 24 hr tablet Take 1 tablet (25 mg total) by mouth daily. 30 tablet 6   No current facility-administered medications for this visit.    Allergies  Allergen Reactions  . Ciprofloxacin Hives and Swelling  . Penicillins Anaphylaxis and Rash  . Codeine Other (See Comments)    REACTION:  had hallicunations  . Morphine Nausea And Vomiting and Other (See Comments)    REACTION:   recieved in ED due to [facility] and had multple doses - gave visual hallucinations and vomitting.  . Sulfonamide Derivatives Other (See Comments)    REACTION: Unsure - childhood allergy  . Vancomycin Itching    History   Social History  . Marital Status: Married    Spouse Name: N/A    Number of Children: 2  . Years of Education: college   Occupational History  .    Marland Kitchen  [employer]   Social  History Main Topics  . Smoking status: Never Smoker   . Smokeless tobacco: Never Used  . Alcohol Use: No  . Drug Use: No  . Sexual Activity:    Partners: Male     Comment: ablation   Other Topics Concern  . Not on file   Social History Narrative   Regular exercise: walks    Caffeine use: not regularly   Employed and full time student at [employer]     Review of Systems: General: negative for chills, fever, night sweats or weight changes.  Cardiovascular: negative for chest pain, dyspnea on exertion, edema, orthopnea, palpitations, paroxysmal nocturnal dyspnea or shortness of breath Dermatological: negative for rash Respiratory: negative for cough or wheezing Urologic: negative for hematuria Abdominal: negative for nausea,  vomiting, diarrhea, bright red blood per rectum, melena, or hematemesis Neurologic: negative for visual changes, syncope, or dizziness All other systems reviewed and are otherwise negative except as noted above.    Blood pressure 124/90, pulse 81, height 4\' 8"  (1.422 m), weight 234 lb 3.2 oz (106.232 kg).  General appearance: alert and no distress Neck: no adenopathy, no carotid bruit, no JVD, supple, symmetrical, trachea midline and thyroid not enlarged, symmetric, no tenderness/mass/nodules Lungs: clear to auscultation bilaterally Heart: regular rate and rhythm, S1, S2 normal, no murmur, click, rub or gallop Extremities: extremities normal, atraumatic, no cyanosis or edema  EKG normal sinus rhythm 81 without ST or T-wave changes. I personally reviewed this EKG  ASSESSMENT AND PLAN:   HYPERLIPIDEMIA History of hyperlipidemia on statin therapy followed by her PCP  Hypertension History of hypertension with blood pressure today measured at 124/90. She is on lisinopril 10 mg a day. I'm going to increase this to 20 mg a day.  Palpitations Complaints of palpitations. I'm going to add low-dose beta-blockade and get a 2 week event monitor.  Chest pain The patient saw this morning in [place] and from there drove to [facility] for another appointment with me. She was complaining of chest pain radiating to her left upper extremity and jaw. I performed cardiac catheterization on her 12/11/12 which was entirely normal. I reassured her that her pain was noncardiac.  Obstructive sleep apnea Patient gives a classic history of obstructive sleep apnea with nocturnal snoring, daytime somnolence palpitations and atypical chest pain. I'm going to get a outpatient sleep study      02/08/13 MD [facility], [employer] 12/08/2014 2:15 PM

## 2014-12-08 NOTE — Progress Notes (Signed)
HPI Patient is a 48 yo who is referred by Hughie Closs for CP    Hx of DM, HTN, HL   She was seen in ER on 12/28  Patinet woke up with chest pressure.  Like something sitting on chest  L arm numb  Went to ER There felt heart racing   Coming out of chest.    Per note from ER CP was pleuritic  Worse with palpation of chest   Ptinet currently denies pleuritic component Patinet had a similar spell in November  She was seen by Hughie Closs yesterday  Referred her. Patinet say CP has been constant since seen in ER  10/10  Felt bad all day  Stayed in bed.   NO change in activity  No recent illness.  Denies reflux  Sometimes food gets stuck  Note had cath 2014 for similar symptoms  Normal  No CAD  LVEF 60%   Allergies  Allergen Reactions  . Ciprofloxacin Hives and Swelling  . Penicillins Anaphylaxis and Rash  . Codeine Other (See Comments)    REACTION:  had hallicunations  . Morphine Nausea And Vomiting and Other (See Comments)    REACTION:   recieved in ED due to [facility] and had multple doses - gave visual hallucinations and vomitting.  . Sulfonamide Derivatives Other (See Comments)    REACTION: Unsure - childhood allergy  . Vancomycin Itching    Current Outpatient Prescriptions  Medication Sig Dispense Refill  . acetaminophen (TYLENOL) 500 MG tablet Take 1,000 mg by mouth every 6 (six) hours as needed for mild pain.    Marland Kitchen aspirin 81 MG tablet Take 81 mg by mouth daily.    . COMBIPATCH 0.05-0.14 MG/DAY Place 1 patch onto the skin every 3 (three) days.    . DULoxetine (CYMBALTA) 20 MG capsule Take 20 mg by mouth daily.    Marland Kitchen gabapentin (NEURONTIN) 300 MG capsule Take 300-600 mg by mouth 3 (three) times daily. Patient takes 1 capsule in the morning and 1 capsule in the afternoon and 2 capsules at night    . glucagon (GLUCAGON EMERGENCY) 1 MG injection Inject in the muscle as needed for hypoglycemia 1 each 2  . Insulin Human (INSULIN PUMP) SOLN Inject into the skin continuous. insulin lispro (HUMALOG)  100 UNIT/ML injection    . INVOKANA 100 MG TABS Take 1 tablet by mouth daily.    Marland Kitchen lisinopril (PRINIVIL,ZESTRIL) 10 MG tablet Take 1 tablet by mouth daily.    Marland Kitchen LORazepam (ATIVAN) 0.5 MG tablet Take 1 tablet by mouth 2 (two) times daily as needed for anxiety.     . simvastatin (ZOCOR) 10 MG tablet Take 1 tablet by mouth every evening.    . traMADol (ULTRAM) 50 MG tablet Take 1 tablet (50 mg total) by mouth every 6 (six) hours as needed. 15 tablet 0   No current facility-administered medications for this visit.    Past Medical History  Diagnosis Date  . Diabetes mellitus     insulin pump 2013  . Neuropathy   . Hypertension   . Hypercholesteremia   . Shortness of breath   . Kidney stone     Past Surgical History  Procedure Laterality Date  . Cesarean section  1994;2000    [place]  . Cholecystectomy  1992  . Sinus sergery  1988    [place]  . Foot surgery Right March, 2013    [facility]-removal of bone spur  . Hysteroscopy with thermachoice  04/25/2012  Procedure: HYSTEROSCOPY WITH THERMACHOICE;  Surgeon: Lazaro Arms, MD;  Location: [facility];  Service: Gynecology;  Laterality: N/A;  total therapy time= 11 minutes 42 seconds; 34 ml D5w  in and 34 ml D5w out; temp =87 degrees F  . Cystoscopy w/ ureteral stent placement  05/08/2012    Procedure: CYSTOSCOPY WITH RETROGRADE PYELOGRAM/URETERAL STENT PLACEMENT;  Surgeon: Ky Barban, MD;  Location: [facility];  Service: Urology;  Laterality: Right;  . Umbilical hernia repair N/A 03/25/2014    Procedure: UMBILICAL HERNIA REPAIR;  Surgeon: Marlane Hatcher, MD;  Location: [facility];  Service: General;  Laterality: N/A;  . Uterine ablation    . Left heart catheterization with coronary angiogram N/A 12/11/2012    Procedure: LEFT HEART CATHETERIZATION WITH CORONARY ANGIOGRAM;  Surgeon: Runell Gess, MD;  Location: [facility] C[facility] LAB;  Service: Cardiovascular;  Laterality: N/A;    Family History  Problem Relation Age of Onset  . Heart  disease    . Cancer    . Diabetes    . Achalasia    . Anesthesia problems Neg Hx   . Hypotension Neg Hx   . Malignant hyperthermia Neg Hx   . Pseudochol deficiency Neg Hx   . Diabetes Mother   . Depression Paternal Grandmother   . Depression Paternal Grandfather     History   Social History  . Marital Status: Married    Spouse Name: N/A    Number of Children: 2  . Years of Education: college   Occupational History  .    Marland Kitchen  [employer]   Social History Main Topics  . Smoking status: Never Smoker   . Smokeless tobacco: Never Used  . Alcohol Use: No  . Drug Use: No  . Sexual Activity:    Partners: Male     Comment: ablation   Other Topics Concern  . Not on file   Social History Narrative   Regular exercise: walks    Caffeine use: not regularly   Employed and full time student at [employer]    Review of Systems:  All systems reviewed.  They are negative to the above problem except as previously stated.  Vital Signs: BP 128/88 mmHg  Pulse 89  Ht 4\' 8"  (1.422 m)  Wt 233 lb 12.8 oz (106.051 kg)  BMI 52.45 kg/m2  Physical Exam Patient is a morbidly obese 48 yo in NAD Orthostatic:  142/98  P 87  Standing 140/92 P 91;  Standing 158/100 P 100 HEENT:  Normocephalic, atraumatic. EOMI, PERRLA.  Neck: JVP is normal.  No bruits.  Lungs: clear to auscultation. No rales no wheezes.  Chest  Tender.  L chest   Heart: Regular rate and rhythm. Normal S1, S2. No S3.   No significant murmurs. PMI not displaced.  Abdomen:  Supple, nontender. Normal bowel sounds. No masses. No hepatomegaly.  Extremities:   Good distal pulses throughout. No lower extremity edema.  Musculoskeletal :moving all extremities.  Neuro:   alert and oriented x3.  CN II-XII grossly intact.  EKG  SR 92 bpm  (12/29)  Assessment and Plan:  1  Chest pain  Atypical  Probable musculoskel vs GI  Or combination of two.  Constant for days  I would recomm rest, antiinflammatories  I would not sched further cardiac  testing  2.  Palpitations.  Patinet noted her heart racing at times  Was doing that while in ER  No documented arrhythmia. Then when she was having symptoms  I do not think an event monitor would yield any signif information Could try low dose b blocker Would help with BP and HR  3.  HTN  BP is up  Will need to be followed  Continue medical Rx    4.  DM  It sounds like glu is uncontrolled most of time  Will check Hgb A1C  5.  HL  On statin  Will check  6. Morbid obesity  Patinet needs to lose wt or life will be limited  Discussed this with her.    Will not sched a definite f/u  Will be in touch with lab results

## 2014-12-08 NOTE — Patient Instructions (Signed)
Please follow up with Dr. Allyson Sabal in 3 months.  Please INCREASE your Lisinopril to 20 mg daily.  START Toprol XL 25 mg daily.  Your physician has recommended that you have a sleep study. This test records several body functions during sleep, including: brain activity, eye movement, oxygen and carbon dioxide blood levels, heart rate and rhythm, breathing rate and rhythm, the flow of air through your mouth and nose, snoring, body muscle movements, and chest and belly movement.  Your Doctor has ordered you to wear a heart monitor. You will wear this for 14 days.   TIPS -  REMINDERS 1. The sensor is the lanyard that is worn around your neck every day - this is powered by a battery that needs to be changed every day 2. The monitor is the device that allows you to record symptoms - this will need to be charged daily 3. The sensor & monitor need to be within 100 feet of each other at all times 4. The sensor connects to the electrodes (stickers) - these should be changed every 24-48 hours (you do not have to remove them when you bathe, just make sure they are dry when you connect it back to the sensor 5. If you need more supplies (electrodes, batteries), please call the 1-800 # on the back of the pamphlet and [employer] will mail you more supplies 6. If your skin becomes sensitive, please try the sample pack of sensitive skin electrodes (the white packet in your silver box) and call [employer] to have them mail you more of these type of electrodes 7. When you are finish wearing the monitor, please place all supplies back in the silver box, place the silver box in the pre-packaged UPS bag and drop off at [employer] or call them so they can come pick it up   Cardiac Event Monitoring A cardiac event monitor is a small recording device used to help detect abnormal heart rhythms (arrhythmias). The monitor is used to record heart rhythm when noticeable symptoms such as the following occur:  Fast heartbeats  (palpitations), such as heart racing or fluttering.  Dizziness.  Fainting or light-headedness.  Unexplained weakness. The monitor is wired to two electrodes placed on your chest. Electrodes are flat, sticky disks that attach to your skin. The monitor can be worn for up to 30 days. You will wear the monitor at all times, except when bathing.  HOW TO USE YOUR CARDIAC EVENT MONITOR A technician will prepare your chest for the electrode placement. The technician will show you how to place the electrodes, how to work the monitor, and how to replace the batteries. Take time to practice using the monitor before you leave the office. Make sure you understand how to send the information from the monitor to your health care provider. This requires a telephone with a landline, not a cell phone. You need to:  Wear your monitor at all times, except when you are in water:  Do not get the monitor wet.  Take the monitor off when bathing. Do not swim or use a hot tub with it on.  Keep your skin clean. Do not put body lotion or moisturizer on your chest.  Change the electrodes daily or any time they stop sticking to your skin. You might need to use tape to keep them on.  It is possible that your skin under the electrodes could become irritated. To keep this from happening, try to put the electrodes in slightly different places on  your chest. However, they must remain in the area under your left breast and in the upper right section of your chest.  Make sure the monitor is safely clipped to your clothing or in a location close to your body that your health care provider recommends.  Press the button to record when you feel symptoms of heart trouble, such as dizziness, weakness, light-headedness, palpitations, thumping, shortness of breath, unexplained weakness, or a fluttering or racing heart. The monitor is always on and records what happened slightly before you pressed the button, so do not worry about being  too late to get good information.  Keep a diary of your activities, such as walking, doing chores, and taking medicine. It is especially important to note what you were doing when you pushed the button to record your symptoms. This will help your health care provider determine what might be contributing to your symptoms. The information stored in your monitor will be reviewed by your health care provider alongside your diary entries.  Send the recorded information as recommended by your health care provider. It is important to understand that it will take some time for your health care provider to process the results.  Change the batteries as recommended by your health care provider. SEEK IMMEDIATE MEDICAL CARE IF:   You have chest pain.  You have extreme difficulty breathing or shortness of breath.  You develop a very fast heartbeat that persists.  You develop dizziness that does not go away.  You faint or constantly feel you are about to faint. Document Released: 09/04/2008 Document Revised: 04/12/2014 Document Reviewed: 05/25/2013 [facility] Patient Information 2015 [place], [place]. This information is not intended to replace advice given to you by your health care provider. Make sure you discuss any questions you have with your health care provider.

## 2014-12-22 ENCOUNTER — Other Ambulatory Visit (HOSPITAL_COMMUNITY): Payer: Self-pay | Admitting: Internal Medicine

## 2014-12-22 DIAGNOSIS — Z09 Encounter for follow-up examination after completed treatment for conditions other than malignant neoplasm: Secondary | ICD-10-CM

## 2014-12-23 ENCOUNTER — Encounter (HOSPITAL_COMMUNITY): Payer: Self-pay | Admitting: General Surgery

## 2014-12-26 ENCOUNTER — Telehealth: Payer: Self-pay | Admitting: Internal Medicine

## 2014-12-26 NOTE — Telephone Encounter (Signed)
Pt had a syncopal episode, preceded by weakness, hot flash, and sweating. Event monitor captured rhythm as sinus tach at 104 bpm. Pt was contacted by cardionet but declined additional medical attention.  Sammuel Hines, MD MPH

## 2015-01-04 ENCOUNTER — Encounter: Payer: Self-pay | Admitting: Internal Medicine

## 2015-01-09 ENCOUNTER — Emergency Department (HOSPITAL_COMMUNITY)
Admission: EM | Admit: 2015-01-09 | Discharge: 2015-01-09 | Disposition: A | Discharge status: Home / Self Care | Payer: Managed Care, Other (non HMO) | Attending: Emergency Medicine | Admitting: Emergency Medicine

## 2015-01-09 ENCOUNTER — Encounter (HOSPITAL_COMMUNITY): Payer: Self-pay | Admitting: Emergency Medicine

## 2015-01-09 ENCOUNTER — Emergency Department (HOSPITAL_COMMUNITY): Payer: Managed Care, Other (non HMO)

## 2015-01-09 DIAGNOSIS — Z7982 Long term (current) use of aspirin: Secondary | ICD-10-CM | POA: Insufficient documentation

## 2015-01-09 DIAGNOSIS — Z794 Long term (current) use of insulin: Secondary | ICD-10-CM | POA: Diagnosis not present

## 2015-01-09 DIAGNOSIS — Z87448 Personal history of other diseases of urinary system: Secondary | ICD-10-CM | POA: Diagnosis not present

## 2015-01-09 DIAGNOSIS — E669 Obesity, unspecified: Secondary | ICD-10-CM | POA: Diagnosis not present

## 2015-01-09 DIAGNOSIS — Z87442 Personal history of urinary calculi: Secondary | ICD-10-CM | POA: Insufficient documentation

## 2015-01-09 DIAGNOSIS — M25561 Pain in right knee: Secondary | ICD-10-CM | POA: Diagnosis not present

## 2015-01-09 DIAGNOSIS — Z79899 Other long term (current) drug therapy: Secondary | ICD-10-CM | POA: Insufficient documentation

## 2015-01-09 DIAGNOSIS — Z880 Allergy status to penicillin: Secondary | ICD-10-CM | POA: Diagnosis not present

## 2015-01-09 DIAGNOSIS — E780 Pure hypercholesterolemia: Secondary | ICD-10-CM | POA: Insufficient documentation

## 2015-01-09 DIAGNOSIS — E119 Type 2 diabetes mellitus without complications: Secondary | ICD-10-CM | POA: Diagnosis not present

## 2015-01-09 DIAGNOSIS — I10 Essential (primary) hypertension: Secondary | ICD-10-CM | POA: Diagnosis not present

## 2015-01-09 DIAGNOSIS — G629 Polyneuropathy, unspecified: Secondary | ICD-10-CM | POA: Insufficient documentation

## 2015-01-09 LAB — CBG MONITORING, ED: Glucose-Capillary: 237 mg/dL — ABNORMAL HIGH (ref 70–99)

## 2015-01-09 LAB — D-DIMER, QUANTITATIVE: D-Dimer, Quant: 0.48 ug/mL-FEU (ref 0.00–0.48)

## 2015-01-09 MED ORDER — HYDROCODONE-ACETAMINOPHEN 5-325 MG PO TABS
1.0000 | ORAL_TABLET | Freq: Once | ORAL | Status: AC
  Administered 2015-01-09: 1 via ORAL
  Filled 2015-01-09: qty 1

## 2015-01-09 MED ORDER — NAPROXEN 500 MG PO TABS: 500.0000 mg | ORAL_TABLET | Freq: Two times a day (BID) | ORAL | Status: DC

## 2015-01-09 MED ORDER — HYDROCODONE-ACETAMINOPHEN 5-325 MG PO TABS: 1.0000 | ORAL_TABLET | Freq: Four times a day (QID) | ORAL | Status: DC | PRN

## 2015-01-09 NOTE — Discharge Instructions (Signed)
As we discussed d-dimer was negative. Due to the small amout of fluid on the knee most likely some sort of inflammatory changes in that right knee. His knee immobilizer as needed for comfort. Keep your appointment with Dr. Romeo Apple. Take the Naprosyn or Motrin on a regular basis. And then supplement with hydrocodone as needed for additional pain relief. Return for any new or worse symptoms.

## 2015-01-09 NOTE — ED Provider Notes (Signed)
CSN: [id number]     Arrival date & time 01/09/15  1758 History   First MD Initiated Contact with Patient 01/09/15 1850     Chief Complaint  Patient presents with  . Knee Pain     (Consider location/radiation/quality/duration/timing/severity/associated sxs/prior Treatment) Patient is a 49 y.o. female presenting with knee pain. The history is provided by the patient.  Knee Pain Associated symptoms: no back pain and no fever    patient with complaint of right knee pain for the past 2 days. No injury. Pain is a all over the knee. Told nursing that radiated down the back of the knee and down the back of the leg but on my start information patient denied that. As stated no known need knee injury. Patient states that she has had some swelling in the foot on and off over the past few days. Patient does not have a history of pulmonary embolus or DVTs. But she does have family members that have had problems like that. Patient is currently being evaluated by cardiology Dr. Tresa Endo for some chest discomfort and shortness of breath has been going on for weeks. Patient has had a cardiac cath in the past recent past that was negative for any significant coronary artery disease. Patient denies any new or worse shortness of breath.  Past Medical History  Diagnosis Date  . Diabetes mellitus     insulin pump 2013  . Neuropathy   . Hypertension   . Hypercholesteremia   . Shortness of breath   . Kidney stone   . Palpitations   . Obesity   . Obstructive sleep apnea    Past Surgical History  Procedure Laterality Date  . Cesarean section  1994;2000    [place]  . Cholecystectomy  1992  . Sinus sergery  1988    [place]  . Foot surgery Right March, 2013    [facility]-removal of bone spur  . Hysteroscopy with thermachoice  04/25/2012    Procedure: HYSTEROSCOPY WITH THERMACHOICE;  Surgeon: Lazaro Arms, MD;  Location: [facility];  Service: Gynecology;  Laterality: N/A;  total therapy time= 11 minutes 42  seconds; 34 ml D5w  in and 34 ml D5w out; temp =87 degrees F  . Cystoscopy w/ ureteral stent placement  05/08/2012    Procedure: CYSTOSCOPY WITH RETROGRADE PYELOGRAM/URETERAL STENT PLACEMENT;  Surgeon: Ky Barban, MD;  Location: [facility];  Service: Urology;  Laterality: Right;  . Umbilical hernia repair N/A 03/25/2014    Procedure: UMBILICAL HERNIA REPAIR;  Surgeon: Marlane Hatcher, MD;  Location: [facility];  Service: General;  Laterality: N/A;  . Uterine ablation    . Left heart catheterization with coronary angiogram N/A 12/11/2012    Procedure: LEFT HEART CATHETERIZATION WITH CORONARY ANGIOGRAM;  Surgeon: Runell Gess, MD;  Location: [facility] CATH LAB;  Service: Cardiovascular;  Laterality: N/A;  . Cardiac catheterization  12/11/2012    normal coronary arteries   Family History  Problem Relation Age of Onset  . Heart disease    . Cancer    . Diabetes    . Achalasia    . Anesthesia problems Neg Hx   . Hypotension Neg Hx   . Malignant hyperthermia Neg Hx   . Pseudochol deficiency Neg Hx   . Diabetes Mother   . Depression Paternal Grandmother   . Depression Paternal Grandfather    History  Substance Use Topics  . Smoking status: Never Smoker   . Smokeless tobacco: Never Used  . Alcohol Use:  No   OB History    Gravida Para Term Preterm AB TAB SAB Ectopic Multiple Living   2 2 2       2      Review of Systems  Constitutional: Negative for fever.  HENT: Positive for congestion.   Eyes: Negative for visual disturbance.  Respiratory: Positive for shortness of breath.   Cardiovascular: Positive for chest pain.  Gastrointestinal: Negative for nausea, vomiting and abdominal pain.  Genitourinary: Negative for dysuria.  Musculoskeletal: Negative for back pain.  Skin: Negative for rash.  Neurological: Negative for headaches.  Hematological: Does not bruise/bleed easily.  Psychiatric/Behavioral: Negative for confusion.      Allergies  Ciprofloxacin; Penicillins; Codeine;  Morphine; Sulfonamide derivatives; and Vancomycin  Home Medications   Prior to Admission medications   Medication Sig Start Date End Date Taking? Authorizing Provider  acetaminophen (TYLENOL) 500 MG tablet Take 1,000 mg by mouth every 6 (six) hours as needed for mild pain.    Historical Provider, MD  ALPRAZolam ) 0.5 MG tablet Take 1 tablet by mouth as needed. 12/07/14   Historical Provider, MD  aspirin 81 MG tablet Take 81 mg by mouth daily.    Historical Provider, MD  COMBIPATCH 0.05-0.14 MG/DAY Place 1 patch onto the skin every 3 (three) days. 10/26/14   Historical Provider, MD  DULoxetine (CYMBALTA) 20 MG capsule Take 20 mg by mouth daily.    Historical Provider, MD  gabapentin (NEURONTIN) 300 MG capsule Take 300-600 mg by mouth 3 (three) times daily. Patient takes 1 capsule in the morning and 1 capsule in the afternoon and 2 capsules at night    Historical Provider, MD  glucagon (GLUCAGON EMERGENCY) 1 MG injection Inject in the muscle as needed for hypoglycemia 12/15/12   02/12/13, MD  HUMALOG 100 UNIT/ML injection  01/06/15   Historical Provider, MD  HYDROcodone-acetaminophen (NORCO/VICODIN) 5-325 MG per tablet Take 1-2 tablets by mouth every 6 (six) hours as needed. 01/09/15   01/11/15, MD  Insulin Human (INSULIN PUMP) SOLN Inject into the skin continuous. insulin lispro (HUMALOG) 100 UNIT/ML injection    Historical Provider, MD  INVOKANA 100 MG TABS Take 1 tablet by mouth daily. 09/13/14   Historical Provider, MD  lisinopril (PRINIVIL,ZESTRIL) 10 MG tablet  12/01/14   Historical Provider, MD  lisinopril (PRINIVIL,ZESTRIL) 20 MG tablet Take 1 tablet (20 mg total) by mouth daily. 12/08/14   12/10/14, MD  LORazepam (ATIVAN) 0.5 MG tablet Take 1 tablet by mouth 2 (two) times daily as needed for anxiety.  09/01/14   Historical Provider, MD  LYRICA 150 MG capsule Take 1 tablet by mouth as needed. 11/19/14   Historical Provider, MD  meclizine (ANTIVERT) 25 MG tablet Take  1 tablet by mouth as needed. 10/05/14   Historical Provider, MD  metoprolol succinate (TOPROL XL) 25 MG 24 hr tablet Take 1 tablet (25 mg total) by mouth daily. 12/08/14   12/10/14, MD  naproxen (NAPROSYN) 500 MG tablet Take 1 tablet (500 mg total) by mouth 2 (two) times daily. 01/09/15   01/11/15, MD  ondansetron (ZOFRAN) 4 MG tablet Take 1 tablet by mouth as needed. 12/06/14   Historical Provider, MD  simvastatin (ZOCOR) 10 MG tablet Take 1 tablet by mouth every evening. 09/07/14   Historical Provider, MD  traMADol (ULTRAM) 50 MG tablet Take 1 tablet (50 mg total) by mouth every 6 (six) hours as needed. 12/06/14   Kristen N Ward, DO   BP 142/94 mmHg  Pulse 79  Temp(Src) 98.2 F (36.8 C) (Oral)  Resp 20  Ht 4\' 8"  (1.422 m)  Wt 237 lb 8 oz (107.729 kg)  BMI 53.28 kg/m2  SpO2 98% Physical Exam  Constitutional: She is oriented to person, place, and time. She appears well-developed and well-nourished. No distress.  HENT:  Head: Normocephalic and atraumatic.  Mouth/Throat: Oropharynx is clear and moist.  Eyes: Conjunctivae and EOM are normal. Pupils are equal, round, and reactive to light.  Neck: Normal range of motion.  Cardiovascular: Normal rate, regular rhythm and normal heart sounds.   No murmur heard. Pulmonary/Chest: Effort normal and breath sounds normal. No respiratory distress.  Abdominal: Soft. Bowel sounds are normal. There is no tenderness.  Musculoskeletal: Normal range of motion. She exhibits no edema or tenderness.  Right knee without obvious effusion. No erythema no redness. No tenderness. No calf tenderness. No swelling to the leg no ankle edema. Dorsalis pedis pulses 2+.  Neurological: She is alert and oriented to person, place, and time. No cranial nerve deficit. She exhibits normal muscle tone. Coordination normal.  Skin: Skin is warm. No rash noted.  Nursing note and vitals reviewed.   ED Course  Procedures (including critical care time) Labs  Review Labs Reviewed  CBG MONITORING, ED - Abnormal; Notable for the following:    Glucose-Capillary 237 (*)    All other components within normal limits  D-DIMER, QUANTITATIVE    Imaging Review Dg Knee Complete 4 Views Right  01/09/2015   CLINICAL DATA:  Posterior right knee pain over the last 2 days with swelling.  EXAM: RIGHT KNEE - COMPLETE 4+ VIEW  COMPARISON:  None.  FINDINGS: Mild marginal spurring in the medial compartment with mild medial compartmental loss of articular space. Small spur at the quadriceps insertion on the patella.  There is a small knee effusion in the suprapatellar bursa. Minimal articular spurring of the patella.  No fracture or acute bony findings observed.  IMPRESSION: 1. Mild osteoarthritis. 2. Small knee effusion.   Electronically Signed   By: 01/11/2015 M.D.   On: 01/09/2015 18:55     EKG Interpretation None      MDM   Final diagnoses:  Knee pain, acute, right    Patient with complaint of right knee pain for 2 days. No injury. Pain is kind all over the knee. No real calf pain no thigh pain. Patient without a history of pulmonary embolus or deep vein thrombosis. Patient's d-dimer here is negative so this is not consistent with a DVT. Patient has no new chest discomfort or shortness of breath. She has some chronic symptoms that are being followed by her etiology. Patient did have cardiac cath recently that was showed no significant disease.  Due to the fluid on the knee most likely inflammatory. Will treat with a knee immobilizer for comfort and anti-inflammatory and pain medicine as needed. Patient has follow-up with Dr. 01/11/2015 in about a week.    Romeo Apple, MD 01/09/15 2151

## 2015-01-09 NOTE — ED Notes (Signed)
Patient c/o right knee pain x2 days. Per patient pain radiates to back of knee and down leg. Patient denies any known injury to knee. Per patient swelling in right knee and foot. Per patient family hx of DVTs and PEs

## 2015-01-11 ENCOUNTER — Ambulatory Visit: Payer: Managed Care, Other (non HMO) | Admitting: Orthopedic Surgery

## 2015-01-18 ENCOUNTER — Ambulatory Visit (INDEPENDENT_AMBULATORY_CARE_PROVIDER_SITE_OTHER): Payer: Managed Care, Other (non HMO) | Admitting: Orthopedic Surgery

## 2015-01-18 ENCOUNTER — Ambulatory Visit (INDEPENDENT_AMBULATORY_CARE_PROVIDER_SITE_OTHER): Payer: Managed Care, Other (non HMO)

## 2015-01-18 ENCOUNTER — Encounter: Payer: Self-pay | Admitting: Orthopedic Surgery

## 2015-01-18 VITALS — BP 139/83 | Ht <= 58 in | Wt 237.0 lb

## 2015-01-18 DIAGNOSIS — M25511 Pain in right shoulder: Secondary | ICD-10-CM

## 2015-01-18 DIAGNOSIS — M25512 Pain in left shoulder: Secondary | ICD-10-CM

## 2015-01-18 DIAGNOSIS — M545 Low back pain, unspecified: Secondary | ICD-10-CM

## 2015-01-18 DIAGNOSIS — M25561 Pain in right knee: Secondary | ICD-10-CM

## 2015-01-18 DIAGNOSIS — M7552 Bursitis of left shoulder: Secondary | ICD-10-CM

## 2015-01-18 DIAGNOSIS — M542 Cervicalgia: Secondary | ICD-10-CM

## 2015-01-18 MED ORDER — METHYLPREDNISOLONE ACETATE 40 MG/ML IJ SUSP
40.0000 mg | Freq: Once | INTRAMUSCULAR | Status: AC
  Administered 2015-01-18: 40 mg via INTRAMUSCULAR

## 2015-01-18 NOTE — Patient Instructions (Signed)
Call to arrange therapy at [facility] therapy dept Joint Injection Care After Refer to this sheet in the next few days. These instructions provide you with information on caring for yourself after you have had a joint injection. Your caregiver also may give you more specific instructions. Your treatment has been planned according to current medical practices, but problems sometimes occur. Call your caregiver if you have any problems or questions after your procedure. After any type of joint injection, it is not uncommon to experience:  Soreness, swelling, or bruising around the injection site.  Mild numbness, tingling, or weakness around the injection site caused by the numbing medicine used before or with the injection. It also is possible to experience the following effects associated with the specific agent after injection:  Iodine-based contrast agents:  Allergic reaction (itching, hives, widespread redness, and swelling beyond the injection site).  Corticosteroids (These effects are rare.):  Allergic reaction.  Increased blood sugar levels (If you have diabetes and you notice that your blood sugar levels have increased, notify your caregiver).  Increased blood pressure levels.  Mood swings.  Hyaluronic acid in the use of viscosupplementation.  Temporary heat or redness.  Temporary rash and itching.  Increased fluid accumulation in the injected joint. These effects all should resolve within a day after your procedure.  HOME CARE INSTRUCTIONS  Limit yourself to light activity the day of your procedure. Avoid lifting heavy objects, bending, stooping, or twisting.  Take prescription or over-the-counter pain medication as directed by your caregiver.  You may apply ice to your injection site to reduce pain and swelling the day of your procedure. Ice may be applied 03-04 times:  Put ice in a plastic bag.  Place a towel between your skin and the bag.  Leave the ice on for no longer  than 15-20 minutes each time. SEEK IMMEDIATE MEDICAL CARE IF:   Pain and swelling get worse rather than better or extend beyond the injection site.  Numbness does not go away.  Blood or fluid continues to leak from the injection site.  You have chest pain.  You have swelling of your face or tongue.  You have trouble breathing or you become dizzy.  You develop a fever, chills, or severe tenderness at the injection site that last longer than 1 day. MAKE SURE YOU:  Understand these instructions.  Watch your condition.  Get help right away if you are not doing well or if you get worse. Document Released: 08/09/2011 Document Revised: 02/18/2012 Document Reviewed: 08/09/2011 [facility] Patient Information 2015 [place], [place]. This information is not intended to replace advice given to you by your health care provider. Make sure you discuss any questions you have with your health care provider.

## 2015-01-18 NOTE — Progress Notes (Signed)
Patient ID: [name], female   DOB: [birth year], 49 y.o.   MRN: [id number]  Chief Complaint  Patient presents with  . Shoulder Pain    Left shoulder pain, no injury.    HPI [name] is a 49 y.o. female.  Presents for evaluation of left shoulder pain without any history of trauma but she also complains of cervical spine pain and also complains of pain in her right knee. She reports that she had several episodes where her right leg gave out from under her and she finally injured her knee prior to that she had no pain in her knee just giving way symptoms after being recumbent or sitting for long periods of time  As far as a left shoulder goes she presents with atraumatic onset of. Acromial left shoulder pain associated with radiation of pain up into the neck pain radiating down into the left arm associated with numbness and tingling of the left hand  She says she does not have any back pain   HPI  Review of Systems Review of Systems   Past Medical History  Diagnosis Date  . Diabetes mellitus     insulin pump 2013  . Neuropathy   . Hypertension   . Hypercholesteremia   . Shortness of breath   . Kidney stone   . Palpitations   . Obesity   . Obstructive sleep apnea     Past Surgical History  Procedure Laterality Date  . Cesarean section  1994;2000    [place]  . Cholecystectomy  1992  . Sinus sergery  1988    [place]  . Foot surgery Right March, 2013    [facility]-removal of bone spur  . Hysteroscopy with thermachoice  04/25/2012    Procedure: HYSTEROSCOPY WITH THERMACHOICE;  Surgeon: Lazaro Arms, MD;  Location: [facility];  Service: Gynecology;  Laterality: N/A;  total therapy time= 11 minutes 42 seconds; 34 ml D5w  in and 34 ml D5w out; temp =87 degrees F  . Cystoscopy w/ ureteral stent placement  05/08/2012    Procedure: CYSTOSCOPY WITH RETROGRADE PYELOGRAM/URETERAL STENT PLACEMENT;  Surgeon: Ky Barban, MD;  Location: [facility];  Service: Urology;   Laterality: Right;  . Umbilical hernia repair N/A 03/25/2014    Procedure: UMBILICAL HERNIA REPAIR;  Surgeon: Marlane Hatcher, MD;  Location: [facility];  Service: General;  Laterality: N/A;  . Uterine ablation    . Left heart catheterization with coronary angiogram N/A 12/11/2012    Procedure: LEFT HEART CATHETERIZATION WITH CORONARY ANGIOGRAM;  Surgeon: Runell Gess, MD;  Location: [facility] CATH LAB;  Service: Cardiovascular;  Laterality: N/A;  . Cardiac catheterization  12/11/2012    normal coronary arteries    Family History  Problem Relation Age of Onset  . Heart disease    . Cancer    . Diabetes    . Achalasia    . Anesthesia problems Neg Hx   . Hypotension Neg Hx   . Malignant hyperthermia Neg Hx   . Pseudochol deficiency Neg Hx   . Diabetes Mother   . Depression Paternal Grandmother   . Depression Paternal Grandfather     Social History History  Substance Use Topics  . Smoking status: Never Smoker   . Smokeless tobacco: Never Used  . Alcohol Use: No    Allergies  Allergen Reactions  . Ciprofloxacin Hives and Swelling  . Penicillins Anaphylaxis and Rash  . Codeine Other (See Comments)    REACTION:  had  hallicunations  . Morphine Nausea And Vomiting and Other (See Comments)    REACTION:   recieved in ED due to [facility] and had multple doses - gave visual hallucinations and vomitting.  . Sulfonamide Derivatives Other (See Comments)    REACTION: Unsure - childhood allergy  . Vancomycin Itching    Current Outpatient Prescriptions  Medication Sig Dispense Refill  . acetaminophen (TYLENOL) 500 MG tablet Take 1,000 mg by mouth every 6 (six) hours as needed for mild pain.    Marland Kitchen ALPRAZolam (XANAX) 0.5 MG tablet Take 1 tablet by mouth as needed.    Marland Kitchen aspirin 81 MG tablet Take 81 mg by mouth daily.    . COMBIPATCH 0.05-0.14 MG/DAY Place 1 patch onto the skin every 3 (three) days.    . DULoxetine (CYMBALTA) 20 MG capsule Take 20 mg by mouth daily.    Marland Kitchen gabapentin  (NEURONTIN) 300 MG capsule Take 300-600 mg by mouth 3 (three) times daily. Patient takes 1 capsule in the morning and 1 capsule in the afternoon and 2 capsules at night    . glucagon (GLUCAGON EMERGENCY) 1 MG injection Inject in the muscle as needed for hypoglycemia 1 each 2  . HUMALOG 100 UNIT/ML injection     . HYDROcodone-acetaminophen (NORCO/VICODIN) 5-325 MG per tablet Take 1-2 tablets by mouth every 6 (six) hours as needed. 14 tablet 0  . Insulin Human (INSULIN PUMP) SOLN Inject into the skin continuous. insulin lispro (HUMALOG) 100 UNIT/ML injection    . INVOKANA 100 MG TABS Take 1 tablet by mouth daily.    Marland Kitchen lisinopril (PRINIVIL,ZESTRIL) 10 MG tablet     . lisinopril (PRINIVIL,ZESTRIL) 20 MG tablet Take 1 tablet (20 mg total) by mouth daily. 30 tablet 6  . LORazepam (ATIVAN) 0.5 MG tablet Take 1 tablet by mouth 2 (two) times daily as needed for anxiety.     Marland Kitchen LYRICA 150 MG capsule Take 1 tablet by mouth as needed.    . meclizine (ANTIVERT) 25 MG tablet Take 1 tablet by mouth as needed.    . metoprolol succinate (TOPROL XL) 25 MG 24 hr tablet Take 1 tablet (25 mg total) by mouth daily. 30 tablet 6  . naproxen (NAPROSYN) 500 MG tablet Take 1 tablet (500 mg total) by mouth 2 (two) times daily. 14 tablet 0  . ondansetron (ZOFRAN) 4 MG tablet Take 1 tablet by mouth as needed.    . simvastatin (ZOCOR) 10 MG tablet Take 1 tablet by mouth every evening.    . traMADol (ULTRAM) 50 MG tablet Take 1 tablet (50 mg total) by mouth every 6 (six) hours as needed. 15 tablet 0   No current facility-administered medications for this visit.       Physical Exam Blood pressure 139/83, height 4\' 8"  (1.422 m), weight 237 lb (107.502 kg). Physical Exam BP 139/83 mmHg  Ht 4\' 8"  (1.422 m)  Wt 237 lb (107.502 kg)  BMI 53.16 kg/m2 General appearance grooming normal she is oriented 3 mood is pleasant affect is normal ambulatory status is abnormal with a brace on the right leg and a limp  Neck tender  decreased range of motion midline cervical spine tenderness. Muscle tone normal skin normal  Thoracic spine tender as well  Lumbar spine tenderness increased muscle tension skin normal without pathologic congenital skin lesions decreased range of motion and increased lumbar lordosis  Left shoulder tenderness decreased range of motion ligament stable painful forward elevation strength normal in the rotator cuff skin normal. Cervical axillary  lymph nodes on the left normal. Right shoulder no tenderness no swelling normal range of motion. Ligament stable strength normal skin normal lymph nodes normal  Radial and ulnar pulses symmetric and equal bilaterally.  No reflex deficits in the upper extremities  Right knee medial joint line tenderness no effusion painful flexion extension but full range of motion stability tests normal McMurray sign positive for medial meniscal tear strength normal skin normal distal pulses intact    Data Reviewed Office imaging include C-spine film and shoulder film  C-spine shows cervical spondylosis mid cervical levels  Shoulder film on the left shows normal shoulder  Right shoulder x-ray shows greater tuberosity bone spur     Assessment    Encounter Diagnoses  Name Primary?  . Pain in joint, shoulder region, left Yes  . Neck pain   . Back pain at L4-L5 level   . Right medial knee pain   . Shoulder bursitis, left         Plan    I will also inject her left shoulder. She is  on naproxen as well as hydrocodone we can continue those. She will be placed in a hinged knee brace.  I also gave her an IM shot for her generalized joint aches and pains she's  on pain medication and naproxen as stated above and I don't feel any other pain medicine should be given   If the back situation doesn't improve after therapy if it fails we can get an MRI of her back and send her to a chiropractor or neurosurgeon for further care or pain management  interventionalists.  Right knee brace for sprain knee    Procedure note the subacromial injection shoulder left   Verbal consent was obtained to inject the  Left   Shoulder  Timeout was completed to confirm the injection site is a subacromial space of the  left  shoulder  Medication used Depo-Medrol 40 mg and lidocaine 1% 3 cc  Anesthesia was provided by ethyl chloride  The injection was performed in the left  posterior subacromial space. After pinning the skin with alcohol and anesthetized the skin with ethyl chloride the subacromial space was injected using a 20-gauge needle. There were no complications  Sterile dressing was applied.

## 2015-01-31 ENCOUNTER — Ambulatory Visit (HOSPITAL_COMMUNITY): Payer: Managed Care, Other (non HMO)

## 2015-02-02 ENCOUNTER — Ambulatory Visit (HOSPITAL_COMMUNITY): Payer: Managed Care, Other (non HMO)

## 2015-02-03 ENCOUNTER — Ambulatory Visit (HOSPITAL_COMMUNITY): Payer: Managed Care, Other (non HMO)

## 2015-02-07 ENCOUNTER — Ambulatory Visit (HOSPITAL_COMMUNITY)
Admission: RE | Admit: 2015-02-07 | Discharge: 2015-02-07 | Disposition: A | Discharge status: Home / Self Care | Payer: Managed Care, Other (non HMO) | Source: Ambulatory Visit | Attending: Internal Medicine | Admitting: Internal Medicine

## 2015-02-07 ENCOUNTER — Ambulatory Visit (HOSPITAL_COMMUNITY): Payer: Managed Care, Other (non HMO) | Admitting: Physical Therapy

## 2015-02-07 ENCOUNTER — Ambulatory Visit (HOSPITAL_COMMUNITY): Payer: Managed Care, Other (non HMO) | Attending: Orthopedic Surgery | Admitting: Specialist

## 2015-02-07 DIAGNOSIS — Z1231 Encounter for screening mammogram for malignant neoplasm of breast: Secondary | ICD-10-CM | POA: Insufficient documentation

## 2015-02-07 DIAGNOSIS — M25619 Stiffness of unspecified shoulder, not elsewhere classified: Secondary | ICD-10-CM

## 2015-02-07 DIAGNOSIS — M25512 Pain in left shoulder: Secondary | ICD-10-CM | POA: Insufficient documentation

## 2015-02-07 DIAGNOSIS — M25612 Stiffness of left shoulder, not elsewhere classified: Secondary | ICD-10-CM | POA: Insufficient documentation

## 2015-02-07 DIAGNOSIS — M6281 Muscle weakness (generalized): Secondary | ICD-10-CM | POA: Insufficient documentation

## 2015-02-07 DIAGNOSIS — M542 Cervicalgia: Secondary | ICD-10-CM | POA: Diagnosis not present

## 2015-02-07 DIAGNOSIS — Z9641 Presence of insulin pump (external) (internal): Secondary | ICD-10-CM | POA: Insufficient documentation

## 2015-02-07 DIAGNOSIS — I10 Essential (primary) hypertension: Secondary | ICD-10-CM | POA: Insufficient documentation

## 2015-02-07 DIAGNOSIS — Z794 Long term (current) use of insulin: Secondary | ICD-10-CM | POA: Diagnosis not present

## 2015-02-07 DIAGNOSIS — E119 Type 2 diabetes mellitus without complications: Secondary | ICD-10-CM | POA: Insufficient documentation

## 2015-02-07 DIAGNOSIS — R29898 Other symptoms and signs involving the musculoskeletal system: Secondary | ICD-10-CM

## 2015-02-07 DIAGNOSIS — Z09 Encounter for follow-up examination after completed treatment for conditions other than malignant neoplasm: Secondary | ICD-10-CM

## 2015-02-07 NOTE — Patient Instructions (Signed)
TOWEL SLIDES COMPLETE FOR 1-3 MINUTES, 3-5 TIMES PER DAY  SHOULDER: Flexion On Table   Place hands on table, elbows straight. Move hips away from body. Press hands down into table. Hold ___ seconds. ___ reps per set, ___ sets per day, ___ days per week  Abduction (Passive)   With arm out to side, resting on table, lower head toward arm, keeping trunk away from table. Hold ____ seconds. Repeat ____ times. Do ____ sessions per day.  Copyright  VHI. All rights reserved.     Internal Rotation (Assistive)   Seated with elbow bent at right angle and held against side, slide arm on table surface in an inward arc. Repeat ____ times. Do ____ sessions per day. Activity: Use this motion to brush crumbs off the table.  Copyright  VHI. All rights reserved.    COMPLETE PENDULUM EXERCISES FOR 30 SECONDS TO A MINUTE EACH, 3-5 TIMES PER DAY. ROM: Pendulum (Side-to-Side)   Deje que el brazo derecho se balancee suavemente de lado a lado mientras oscila el cuerpo de [place]. Repita ____ veces por rutina. Realice ____ rutinas por sesin. Realice ____ sesiones por da.  http://orth.exer.us/792   Copyright  VHI. All rights reserved.  Pendulum Forward/Back   Bend forward 90 at waist, using table for support. Rock body forward and back to swing arm. Repeat ____ times. Do ____ sessions per day.  Copyright  VHI. All rights reserved.  Pendulum Circular   Bend forward 90 at waist, leaning on table for support. Rock body in a circular pattern to move arm clockwise ____ times then counterclockwise ____ times. Do ____ sessions per day.  Copyright  VHI. All rights reserved.   COMPLETE NECK AROM 10 TIMES EACH, 2-3 TIMES PER DAY AROM: Lateral Neck Flexion   Slowly tilt head toward one shoulder, then the other. Hold each position ____ seconds. Repeat ____ times per set. Do ____ sets per session. Do ____ sessions per day.  http://orth.exer.us/296   Copyright  VHI. All rights  reserved.  AROM: Neck Extension   Bend head backward. Hold ____ seconds. Repeat ____ times per set. Do ____ sets per session. Do ____ sessions per day.  http://orth.exer.us/300   Copyright  VHI. All rights reserved.  AROM: Neck Flexion   Bend head forward. Hold ____ seconds. Repeat ____ times per set. Do ____ sets per session. Do ____ sessions per day.  http://orth.exer.us/298   Copyright  VHI. All rights reserved.  AROM: Neck Rotation   Turn head slowly to look over one shoulder, then the other. Hold each position ____ seconds. Repeat ____ times per set. Do ____ sets per session. Do ____ sessions per day.  http://orth.exer.us/294   Copyright  VHI. All rights reserved.

## 2015-02-07 NOTE — Therapy (Signed)
[facility] [address] [place], [place], [address] Phone: [phone]   Fax:  [phone]  Occupational Therapy Evaluation  Patient Details  Name: [name] MRN: [id number] Date of Birth: 1966-12-10 Referring Provider:  Vickki Hearing, MD  Encounter Date: 02/07/2015      OT End of Session - 02/07/15 1629    Visit Number 1   Number of Visits 16   Date for OT Re-Evaluation 04/08/15  mini reassess 3/28   Authorization Type AETNA   Authorization Time Period  60 visits - will have PT as well   Authorization - Visit Number 1   Authorization - Number of Visits 60   OT Start Time 1430   OT Stop Time 1515   OT Time Calculation (min) 45 min   Activity Tolerance Patient tolerated treatment well   Behavior During Therapy [facility] for tasks assessed/performed      Past Medical History  Diagnosis Date  . Diabetes mellitus     insulin pump 2013  . Neuropathy   . Hypertension   . Hypercholesteremia   . Shortness of breath   . Kidney stone   . Palpitations   . Obesity   . Obstructive sleep apnea     Past Surgical History  Procedure Laterality Date  . Cesarean section  1994;2000    [place]  . Cholecystectomy  1992  . Sinus sergery  1988    [place]  . Foot surgery Right March, 2013    [facility]-removal of bone spur  . Hysteroscopy with thermachoice  04/25/2012    Procedure: HYSTEROSCOPY WITH THERMACHOICE;  Surgeon: Lazaro Arms, MD;  Location: [facility];  Service: Gynecology;  Laterality: N/A;  total therapy time= 11 minutes 42 seconds; 34 ml D5w  in and 34 ml D5w out; temp =87 degrees F  . Cystoscopy w/ ureteral stent placement  05/08/2012    Procedure: CYSTOSCOPY WITH RETROGRADE PYELOGRAM/URETERAL STENT PLACEMENT;  Surgeon: Ky Barban, MD;  Location: [facility];  Service: Urology;  Laterality: Right;  . Umbilical hernia repair N/A 03/25/2014    Procedure: UMBILICAL HERNIA REPAIR;  Surgeon: Marlane Hatcher, MD;   Location: [facility];  Service: General;  Laterality: N/A;  . Uterine ablation    . Left heart catheterization with coronary angiogram N/A 12/11/2012    Procedure: LEFT HEART CATHETERIZATION WITH CORONARY ANGIOGRAM;  Surgeon: Runell Gess, MD;  Location: [facility] CATH LAB;  Service: Cardiovascular;  Laterality: N/A;  . Cardiac catheterization  12/11/2012    normal coronary arteries    There were no vitals taken for this visit.  Visit Diagnosis:  Pain in joint, shoulder region, left - Plan: OT PLAN OF CARE CERT/RE-CERT  Cervicalgia - Plan: OT PLAN OF CARE CERT/RE-CERT  Shoulder weakness - Plan: OT PLAN OF CARE CERT/RE-CERT  Decreased range of motion (ROM) of shoulder - Plan: OT PLAN OF CARE CERT/RE-CERT      Subjective Assessment - 02/07/15 1619    Symptoms S:  My shoulder and neck have hurt off and on.  Now it has been hurting constantly, that's why I went to Dr. Romeo Apple.   Pertinent History Mrs. Egert has been experiencing intermittent pain in her left shoulder and neck intermittently for several months.  Her pain became constant in the past few months, and she consulted with Dr. Romeo Apple.  She was given a cortisone injection, which she reports did not alleviate her symptoms at all.  She has been referred to occupational  therapy for evaluation and treatment.    Patient Stated Goals I want relief from my pain.    Currently in Pain? Yes   Pain Score 10-Worst pain ever   Pain Location Shoulder   Pain Orientation Left   Pain Descriptors / Indicators Aching;Constant;Heaviness   Pain Type Acute pain   Pain Radiating Towards cervical region   Pain Onset More than a month ago   Pain Frequency Constant   Aggravating Factors  constant nothing makes it worse   Pain Relieving Factors constant nothing makes it better   Effect of Pain on Daily Activities can't use her left arm with daily activities.          [facility] OT Assessment - 02/07/15 0001    Assessment   Diagnosis Left Shoulder and Cervical  Pain   Onset Date 11/09/14  approximate   Precautions   Precautions None   Restrictions   Weight Bearing Restrictions No   Balance Screen   Has the patient fallen in the past 6 months No   Has the patient had a decrease in activity level because of a fear of falling?  No   Is the patient reluctant to leave their home because of a fear of falling?  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   Leisure enjoys reading and crossword puzzles   ADL   ADL comments unable to use her left arm to reach overhead, behind her back, unable to lift anything heavy   Written Expression   Dominant Hand Right   Vision - History   Baseline Vision No visual deficits   Cognition   Overall Cognitive Status Within Functional Limits for tasks assessed   Observation/Other Assessments   Focus on Therapeutic Outcomes (FOTO)  35/100   Sensation   Light Touch Appears Intact   Coordination   Gross Motor Movements are Fluid and Coordinated Yes   Fine Motor Movements are Fluid and Coordinated Yes   ROM / Strength   AROM / PROM / Strength AROM;PROM;Strength   Palpation   Palpation max fascial restrictions in left shoulder, upper arm, scapular, trapezius, and sternocleidomastoid region   AROM   Overall AROM Comments assessed in seated, ER/IR with shoulder adducted   AROM Assessment Site Shoulder   Right/Left Shoulder Left   Left Shoulder Flexion 54 Degrees   Left Shoulder ABduction 51 Degrees   Left Shoulder Internal Rotation 55 Degrees   Left Shoulder External Rotation 34 Degrees   Cervical Flexion 10   Cervical  Extension 25   Cervical - Right Side Bend 19   Cervical - Left Side Bend 30   Cervical - Right Rotation 19   Cervical - Left Rotation 30   PROM   Overall PROM Comments assessed in supine - 50% range with soft end feel and max guarding from patient   PROM Assessment Site Shoulder   Right/Left Shoulder Left   Strength   Strength Assessment Site Shoulder   Right/Left Shoulder Left   Left Shoulder Flexion 3-/5   Left Shoulder Extension 3-/5   Left Shoulder ABduction 3-/5   Left Shoulder Internal Rotation 3-/5  Left Shoulder External Rotation 3-/5                OT Treatments/Exercises (OP) - 02/07/15 0001    Manual Therapy   Manual Therapy Myofascial release   Myofascial Release Myofascial release and manual stretching to left upper arm, scapular, shoulder, trapezius, and sternocleidomastoid region to decrease pain and restrictions and improve pain free moblity               OT Education - 02/07/15 1629    Education provided Yes   Education Details cervical AROM, table slides, pendulums   Person(s) Educated Patient   Methods Explanation;Demonstration;Handout   Comprehension Verbalized understanding          OT Short Term Goals - 02/07/15 1636    OT SHORT TERM GOAL #1   Title Patient will be educated on a HEP.   Time 4   Period Weeks   Status New   OT SHORT TERM GOAL #2   Title Patient will improve left shoulder PROM to [facility] for increased ease with donning and doffing clothing.   Time 4   Period Weeks   OT SHORT TERM GOAL #3   Title Patient will increase left shoulder strength to 3+/5 for increased ability to assist clients at work.    Time 4   Period Weeks   Status New   OT SHORT TERM GOAL #4   Title Patient will decrease left shoulder and neck pain to 7/10 or better when working.   Time 4   Period Weeks   Status New   OT SHORT TERM GOAL #5   Title Patient will decrease left shoulder and neck pain fascial restrictions to moderate.   Time 4    Period Weeks           OT Long Term Goals - 02/07/15 1638    OT LONG TERM GOAL #1   Title Patient will return to prior level of independence with all B/IADLs, work, and leisure activities.    Time 8   Period Weeks   Status New   OT LONG TERM GOAL #2   Title Patient will improve cervical and left shoulder AROM to [facility] for increased ablity to reach overhead and behind her back.    Time 8   Period Weeks   Status New   OT LONG TERM GOAL #3   Title Patient will improve left shoulder strength to 4-/5 for increased abilty to lift bags of groceries.   Time 8   Period Weeks   Status New   OT LONG TERM GOAL #4   Title Patient will decrease pain in her left shoulder and neck region to 3/10 or better when completing daily activities.    Time 8   Period Weeks   Status New   OT LONG TERM GOAL #5   Title Patient will decrease fascial restrictions to minimal in her left shoudler and neck region.    Time 8   Period Weeks   Status New               Plan - 02/07/15 1630    Clinical Impression Statement A:  Patient is a 48 year old female referred to occupational therapy for evaluation and treatment for left shoulder pain that refers to her hand and to her cervical region.  Patient very guarded with PROM.  Patient reporting 10/10 pain at rest. Patient is right hand dominant and reports inability to use her left arm with many functional  tasks due to her elevated pain level.   Pt will benefit from skilled therapeutic intervention in order to improve on the following deficits (Retired) Decreased range of motion;Decreased strength;Increased fascial restricitons;Increased muscle spasms;Pain   Rehab Potential Fair   OT Frequency 2x / week   OT Duration 8 weeks   OT Treatment/Interventions Self-care/ADL training;Neuromuscular education;Therapeutic exercise;Manual Therapy;Passive range of motion;Therapeutic activities;Therapeutic exercises;Patient/family education  modalities and traction as  needed   Plan P:  Skilled OT intervention to decrease pain and restrictions and improve on limited range of motion and strength in order to regain functional use of her left arm with daily, work, and leisure activities. Treatment Plan:  MFR, manual stretching, ball stretches, neck AROM.  Progress as tolerated.   Consulted and Agree with Plan of Care Patient        Problem List Patient Active Problem List   Diagnosis Date Noted  . Palpitations 12/08/2014  . Chest pain 12/08/2014  . Obstructive sleep apnea 12/08/2014  . Incarcerated umbilical hernia 03/25/2014  . Cellulitis 02/19/2014  . Pelvic pain 07/15/2013  . Depression 07/15/2013  . Decreased libido 12/23/2012  . Dyspnea 11/12/2012  . Headache 10/14/2012  . Neuropathy 10/14/2012  . Candidal intertrigo 09/12/2012  . Diabetes mellitus type II, uncontrolled 05/10/2012  . Hypertension 05/10/2012  . HYPERLIPIDEMIA 09/29/2008  . OBESITY 08/31/2008  . NEPHROLITHIASIS 08/31/2008    Shirlean Mylar, OTR/L [phone]  02/07/2015, 4:44 PM   [facility] [address] [place], [place], [address] Phone: [phone]   Fax:  [phone]

## 2015-02-08 ENCOUNTER — Other Ambulatory Visit: Payer: Self-pay | Admitting: Internal Medicine

## 2015-02-08 DIAGNOSIS — R928 Other abnormal and inconclusive findings on diagnostic imaging of breast: Secondary | ICD-10-CM

## 2015-02-10 ENCOUNTER — Ambulatory Visit (HOSPITAL_COMMUNITY): Payer: Managed Care, Other (non HMO) | Attending: Orthopedic Surgery | Admitting: Physical Therapy

## 2015-02-10 ENCOUNTER — Encounter (HOSPITAL_COMMUNITY): Payer: Self-pay | Admitting: Physical Therapy

## 2015-02-10 DIAGNOSIS — Z9641 Presence of insulin pump (external) (internal): Secondary | ICD-10-CM | POA: Insufficient documentation

## 2015-02-10 DIAGNOSIS — M25512 Pain in left shoulder: Secondary | ICD-10-CM | POA: Diagnosis not present

## 2015-02-10 DIAGNOSIS — M5489 Other dorsalgia: Secondary | ICD-10-CM

## 2015-02-10 DIAGNOSIS — Z794 Long term (current) use of insulin: Secondary | ICD-10-CM | POA: Insufficient documentation

## 2015-02-10 DIAGNOSIS — I10 Essential (primary) hypertension: Secondary | ICD-10-CM | POA: Insufficient documentation

## 2015-02-10 DIAGNOSIS — R269 Unspecified abnormalities of gait and mobility: Secondary | ICD-10-CM

## 2015-02-10 DIAGNOSIS — M25612 Stiffness of left shoulder, not elsewhere classified: Secondary | ICD-10-CM | POA: Diagnosis not present

## 2015-02-10 DIAGNOSIS — M542 Cervicalgia: Secondary | ICD-10-CM | POA: Diagnosis not present

## 2015-02-10 DIAGNOSIS — E119 Type 2 diabetes mellitus without complications: Secondary | ICD-10-CM | POA: Insufficient documentation

## 2015-02-10 DIAGNOSIS — R531 Weakness: Secondary | ICD-10-CM

## 2015-02-10 DIAGNOSIS — M6281 Muscle weakness (generalized): Secondary | ICD-10-CM | POA: Insufficient documentation

## 2015-02-10 DIAGNOSIS — R29898 Other symptoms and signs involving the musculoskeletal system: Secondary | ICD-10-CM

## 2015-02-10 DIAGNOSIS — M25561 Pain in right knee: Secondary | ICD-10-CM

## 2015-02-10 DIAGNOSIS — R201 Hypoesthesia of skin: Secondary | ICD-10-CM

## 2015-02-10 NOTE — Therapy (Signed)
[facility] [address] [place], [place], [address] Phone: [phone]   Fax:  [phone]  Physical Therapy Evaluation  Patient Details  Name: [name] MRN: [id number] Date of Birth: July 05, 1966 Referring Provider:  Vickki Hearing, MD  Encounter Date: 02/10/2015      PT End of Session - 02/10/15 1631    Visit Number 1   Number of Visits 18   Date for PT Re-Evaluation 03/03/15   Authorization Type Aetna    Authorization Time Period 02/10/15 to 04/12/15 (60 visit limit; also seeing OT)   Authorization - Visit Number 1   PT Start Time 1432   PT Stop Time 1513   PT Time Calculation (min) 41 min   Activity Tolerance Patient limited by pain   Behavior During Therapy [facility] for tasks assessed/performed      Past Medical History  Diagnosis Date  . Diabetes mellitus     insulin pump 2013  . Neuropathy   . Hypertension   . Hypercholesteremia   . Shortness of breath   . Kidney stone   . Palpitations   . Obesity   . Obstructive sleep apnea     Past Surgical History  Procedure Laterality Date  . Cesarean section  1994;2000    [place]  . Cholecystectomy  1992  . Sinus sergery  1988    [place]  . Foot surgery Right March, 2013    [facility]-removal of bone spur  . Hysteroscopy with thermachoice  04/25/2012    Procedure: HYSTEROSCOPY WITH THERMACHOICE;  Surgeon: Lazaro Arms, MD;  Location: [facility];  Service: Gynecology;  Laterality: N/A;  total therapy time= 11 minutes 42 seconds; 34 ml D5w  in and 34 ml D5w out; temp =87 degrees F  . Cystoscopy w/ ureteral stent placement  05/08/2012    Procedure: CYSTOSCOPY WITH RETROGRADE PYELOGRAM/URETERAL STENT PLACEMENT;  Surgeon: Ky Barban, MD;  Location: [facility];  Service: Urology;  Laterality: Right;  . Umbilical hernia repair N/A 03/25/2014    Procedure: UMBILICAL HERNIA REPAIR;  Surgeon: Marlane Hatcher, MD;  Location: [facility];  Service: General;  Laterality: N/A;   . Uterine ablation    . Left heart catheterization with coronary angiogram N/A 12/11/2012    Procedure: LEFT HEART CATHETERIZATION WITH CORONARY ANGIOGRAM;  Surgeon: Runell Gess, MD;  Location: [facility] CATH LAB;  Service: Cardiovascular;  Laterality: N/A;  . Cardiac catheterization  12/11/2012    normal coronary arteries    There were no vitals taken for this visit.  Visit Diagnosis:  Back pain without sciatica - Plan: PT plan of care cert/re-cert  Abnormality of gait - Plan: PT plan of care cert/re-cert  Reduced sensation - Plan: PT plan of care cert/re-cert  Right leg weakness - Plan: PT plan of care cert/re-cert  Generalized weakness - Plan: PT plan of care cert/re-cert  Knee pain, right - Plan: PT plan of care cert/re-cert      Subjective Assessment - 02/10/15 1625    Symptoms very very painful, really disrupts daily activties    Pertinent History sudden onset in late [place]; patient has had multiple falls out of bed and husband has been having to help her get around. Keeps patient up at night; 4 hours of sleep on average. High level of assist from husband.    How long can you sit comfortably? 10 minutes; sometimes patient needs her husband to help her up with Mod-Max(A)   How long can you  stand comfortably? 5-10 minutes   How long can you walk comfortably? immediate discomfort   Diagnostic tests X-ray  of shoulder and Cervical spine   Patient Stated Goals to reduce pain, return to PLOF   Currently in Pain? Yes   Pain Score 9    Pain Location Back  back and neck   Aggravating Factors  constant, nothing makes it worse   Pain Relieving Factors constant, nothing makes it better          [facility] PT Assessment - 02/10/15 0001    Assessment   Medical Diagnosis Cervical spondylosis, lumbar stabilization, knee pain   Onset Date 01/05/15   Next MD Visit March 22nd, 2016   Balance Screen   Has the patient fallen in the past 6 months Yes   How many times? 3+   Has the patient  had a decrease in activity level because of a fear of falling?  Yes   Is the patient reluctant to leave their home because of a fear of falling?  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   Leisure enjoys reading and crossword puzzles   Observation/Other Assessments   Focus on Therapeutic Outcomes (FOTO)  59% limited (PT)   Sensation   Additional Comments Reduced sensation to light touch and pressure entire R leg   Posture/Postural Control   Posture Comments Wide BOS, increased lumbar lordosis, rounded shoulders, forward head   AROM   Lumbar Flexion 15   Lumbar Extension 10   Lumbar - Right Side Bend 12   Lumbar - Left Side Bend 20   Thoracic Flexion 15   Thoracic Extension 6   Thoracic - Right Rotation 24   Thoracic - Left Rotation 30   Strength   Right Hip Flexion 2/5   Left Hip Flexion 3/5   Right Knee Flexion 3/5  pain limited   Right Knee Extension 3+/5  pain limited   Left Knee Flexion 3+/5   Left Knee Extension 4-/5   Right Ankle Dorsiflexion 3+/5   Left Ankle Dorsiflexion 4-/5   Ambulation/Gait   Gait Comments Wide base of support, reduced step lengths, reduced hip and knee mobility, reduced dorisflexion, rigid spine, wide base of support, poor shock absorption                          PT Education - 02/10/15 1630    Education provided Yes   Education Details HEP; assigned thoracic flexion/extension/rotation/side bends and gentle hip excursions, ALL within pain free range. Education regarding concerns during evaluation and need for followup with OT and MD.    Person(s) Educated Patient   Methods Explanation   Comprehension Verbalized understanding;Returned demonstration          PT Short Term Goals - 02/10/15 1640    PT SHORT TERM GOAL #1   Title  Patient will experience no more than 6/10 pain with sitting, standing, and gait based activities in order to allow her to participate in functional work tasks   Time 3   Period Weeks   Status New   PT SHORT TERM GOAL #2   Title Patient will demonstrate the ability to sit for at least 60 minutes and stand for at least 30 minutes with pain no more than 6/10   Time  3   Period Weeks   Status New   PT SHORT TERM GOAL #3   Title Patient will demonstrate an increase of at least 7 degrees in all planes of motion in the lumbar and thoracic spines   Time 3   Period Weeks   Status New   PT SHORT TERM GOAL #4   Title Patient will demonstrate an increase in strength of at least one muscle grade in entire R leg in order to reduce pain and allow patient to participate in functional tasks   Time 3   Period Weeks   Status New   PT SHORT TERM GOAL #5   Title Patient will demonstrate bilateral hip internal rotation of at least 35 degrees and bilateral hip external rotation of at least 40 degrees   Time 3   Period Weeks   Status New           PT Long Term Goals - 02/10/15 1642    PT LONG TERM GOAL #1   Title Patient will demonstrate the ability to correctly and consistently perform appropriate HEP in order to maintain functional gains and prevent recurrence of pain   Time 6   Period Weeks   Status New   PT LONG TERM GOAL #2   Title Patient will demonstrate the abilty to perform sitting tasks for at least 2 hours with pain no more than 2/10   Time 6   Period Weeks   Status New   PT LONG TERM GOAL #3   Title Patient will demonstrate the ability to remain in an upright weight bearing position for at least 3 hours and ambulate at least 90 minutes in order to increase functional task and work performance with pain no more than 2/10   Time 6   Period Weeks   Status New   PT LONG TERM GOAL #4   Title Patient will demonstrate the ability to perform full squat and lift at least 20 pounds in order  to 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 Upon evaluation therapist noted that pain had come on with no injury or illness, and per patient was keeping her awake at night; therapist also noted that patient has significant weakness and reduced sensation in R leg. Bowel and bladder habits normal per patient. Need to further examine hips to in more detail assess strength patterns, as well as hip ROM. Rehabilitation supervisor and patient's MD aware of patient status; sent letter to MD requesting further input or possible imaging. Patient may benefit from continued PT services to further identify deficits, assist in identifying cause of pain and disablity, and to address strength, mobility, gait, and pain limitations.    Pt will benefit from skilled therapeutic intervention in order to improve on the following deficits Abnormal gait;Decreased endurance;Impaired perceived functional ability;Improper body mechanics;Decreased activity tolerance;Decreased strength;Impaired flexibility;Postural dysfunction;Decreased balance;Decreased mobility;Difficulty walking;Impaired sensation;Obesity;Decreased range of motion;Pain;Decreased  coordination;Decreased safety awareness   Rehab Potential Fair   PT Frequency 3x / week   PT Duration 6 weeks   PT Treatment/Interventions ADLs/Self Care Home Management;Gait training;Neuromuscular re-education;Stair training;Functional mobility training;Patient/family education;Passive range of motion;Cryotherapy;Therapeutic  activities;Electrical Stimulation;Therapeutic exercise;Manual techniques;Energy conservation;Moist Heat;DME Instruction;Balance training   PT Next Visit Plan TO Keldric Poyer. Further assess hip strength and range of motion. Special tests for neuro, further investigate cause of symptoms. Gentle mobility of hips, lumbar and thoracic spines. Cautious strengthening of bilateral legs.    PT Home Exercise Plan given   Consulted and Agree with Plan of Care Patient         Problem List Patient Active Problem List   Diagnosis Date Noted  . Palpitations 12/08/2014  . Chest pain 12/08/2014  . Obstructive sleep apnea 12/08/2014  . Incarcerated umbilical hernia 03/25/2014  . Cellulitis 02/19/2014  . Pelvic pain 07/15/2013  . Depression 07/15/2013  . Decreased libido 12/23/2012  . Dyspnea 11/12/2012  . Headache 10/14/2012  . Neuropathy 10/14/2012  . Candidal intertrigo 09/12/2012  . Diabetes mellitus type II, uncontrolled 05/10/2012  . Hypertension 05/10/2012  . HYPERLIPIDEMIA 09/29/2008  . OBESITY 08/31/2008  . NEPHROLITHIASIS 08/31/2008    Nedra Hai PT, DPT [phone]  [facility] [address] [place], [place], [address] Phone: [phone]   Fax:  [phone]

## 2015-02-11 ENCOUNTER — Telehealth (HOSPITAL_COMMUNITY): Payer: Self-pay | Admitting: Physical Therapy

## 2015-02-11 NOTE — Telephone Encounter (Signed)
Spoke with Dr. Romeo Apple, the patient's referring doctor, about concerns found in initial evaluation. Dr. Romeo Apple recommended that patient see primary doctor for appropriate management of problem. Called patient and left message requesting that she call clinic back; patient immediately returned phone call. Educated patient regarding Dr. [name] recommendation as well as PT concern that patient be further assessed by a medical doctor in order to assist in further investigating cause of back pain, especially since pain had come on without illness or injury and had no worsening/alleviating factors. Patient very pleasant and agreeable to scheduling an appointment with her primary to further investigate her pain, agreed with and voiced verbal understanding to PT education regarding concerns about her pain as well as overall PT plan of care. PT to message Dr. Catalina Pizza (patient's primary doctor) and will also fax initial evaluation to Dr. Scharlene Gloss office.   Nedra Hai PT, DPT [phone]

## 2015-02-14 ENCOUNTER — Ambulatory Visit (HOSPITAL_COMMUNITY): Payer: Managed Care, Other (non HMO) | Admitting: Occupational Therapy

## 2015-02-15 ENCOUNTER — Ambulatory Visit (HOSPITAL_COMMUNITY)
Admission: RE | Admit: 2015-02-15 | Discharge: 2015-02-15 | Disposition: A | Discharge status: Home / Self Care | Payer: Managed Care, Other (non HMO) | Source: Ambulatory Visit | Attending: Internal Medicine | Admitting: Internal Medicine

## 2015-02-15 ENCOUNTER — Other Ambulatory Visit: Payer: Self-pay | Admitting: Internal Medicine

## 2015-02-15 DIAGNOSIS — R928 Other abnormal and inconclusive findings on diagnostic imaging of breast: Secondary | ICD-10-CM

## 2015-02-15 DIAGNOSIS — N6002 Solitary cyst of left breast: Secondary | ICD-10-CM | POA: Insufficient documentation

## 2015-02-16 ENCOUNTER — Encounter: Payer: Self-pay | Admitting: Internal Medicine

## 2015-02-18 ENCOUNTER — Encounter (HOSPITAL_COMMUNITY): Payer: Self-pay | Admitting: Occupational Therapy

## 2015-02-18 ENCOUNTER — Ambulatory Visit (HOSPITAL_COMMUNITY): Payer: Managed Care, Other (non HMO) | Admitting: Occupational Therapy

## 2015-02-18 DIAGNOSIS — M25512 Pain in left shoulder: Secondary | ICD-10-CM | POA: Diagnosis not present

## 2015-02-18 DIAGNOSIS — M25619 Stiffness of unspecified shoulder, not elsewhere classified: Secondary | ICD-10-CM

## 2015-02-18 DIAGNOSIS — R29898 Other symptoms and signs involving the musculoskeletal system: Secondary | ICD-10-CM

## 2015-02-18 NOTE — Therapy (Signed)
[facility] [address] [place], [place], [address] Phone: [phone]   Fax:  [phone]  Occupational Therapy Treatment  Patient Details  Name: [name] MRN: [id number] Date of Birth: February 08, 1966 Referring Provider:  Catalina Pizza, MD  Encounter Date: 02/18/2015      OT End of Session - 02/18/15 1529    Visit Number 2   Number of Visits 16   Authorization Type AET[employer]   Authorization Time Period  60 visits - will have PT as well   Authorization - Visit Number 2   Authorization - Number of Visits 60   OT Start Time 1440   OT Stop Time 1530   OT Time Calculation (min) 50 min   Activity Tolerance Patient tolerated treatment well   Behavior During Therapy [facility] for tasks assessed/performed      Past Medical History  Diagnosis Date  . Diabetes mellitus     insulin pump 2013  . Neuropathy   . Hypertension   . Hypercholesteremia   . Shortness of breath   . Kidney stone   . Palpitations   . Obesity   . Obstructive sleep apnea     Past Surgical History  Procedure Laterality Date  . Cesarean section  1994;2000    [place]  . Cholecystectomy  1992  . Sinus sergery  1988    [place]  . Foot surgery Right March, 2013    [facility]-removal of bone spur  . Hysteroscopy with thermachoice  04/25/2012    Procedure: HYSTEROSCOPY WITH THERMACHOICE;  Surgeon: Lazaro Arms, MD;  Location: [facility];  Service: Gynecology;  Laterality: N/A;  total therapy time= 11 minutes 42 seconds; 34 ml D5w  in and 34 ml D5w out; temp =87 degrees F  . Cystoscopy w/ ureteral stent placement  05/08/2012    Procedure: CYSTOSCOPY WITH RETROGRADE PYELOGRAM/URETERAL STENT PLACEMENT;  Surgeon: Ky Barban, MD;  Location: [facility];  Service: Urology;  Laterality: Right;  . Umbilical hernia repair N/A 03/25/2014    Procedure: UMBILICAL HERNIA REPAIR;  Surgeon: Marlane Hatcher, MD;  Location: [facility];  Service: General;  Laterality: N/A;  . Uterine  ablation    . Left heart catheterization with coronary angiogram N/A 12/11/2012    Procedure: LEFT HEART CATHETERIZATION WITH CORONARY ANGIOGRAM;  Surgeon: Runell Gess, MD;  Location: [facility] CATH LAB;  Service: Cardiovascular;  Laterality: N/A;  . Cardiac catheterization  12/11/2012    normal coronary arteries    There were no vitals filed for this visit.  Visit Diagnosis:  Pain in joint, shoulder region, left  Shoulder weakness  Decreased range of motion (ROM) of shoulder      Subjective Assessment - 02/18/15 1527    Symptoms S: My arm and shoulder hurt all the time, nothing really helps it.    Currently in Pain? Yes   Pain Score 8    Pain Location --  shoulder and neck   Pain Orientation Left   Pain Descriptors / Indicators Aching;Constant   Pain Type Acute pain                    OT Treatments/Exercises (OP) - 02/18/15 1528    Exercises   Exercises Shoulder   Shoulder Exercises: Supine   Protraction PROM;10 reps   Horizontal ABduction PROM;10 reps   External Rotation PROM;10 reps   Internal Rotation PROM;10 reps   Flexion PROM;10 reps   ABduction PROM;10 reps   Shoulder Exercises:  Therapy Ball   Flexion 10 reps   ABduction 10 reps   Modalities   Modalities Electrical Stimulation;Moist Heat   Moist Heat Therapy   Number Minutes Moist Heat 10 Minutes   Moist Heat Location Shoulder   Electrical Stimulation   Electrical Stimulation Location Left shoulder   Electrical Stimulation Action Interferential   Electrical Stimulation Parameters 8.0 volts   Electrical Stimulation Goals Pain   Manual Therapy   Manual Therapy Myofascial release   Myofascial Release Myofascial release and manual stretching to left upper arm, scapular, shoulder, trapezius, and sternocleidomastoid region to decrease pain and restrictions and improve pain free moblity                  OT Short Term Goals - 02/18/15 1538    OT SHORT TERM GOAL #1   Title Patient will be  educated on a HEP.   Time 4   Period Weeks   Status On-going   OT SHORT TERM GOAL #2   Title Patient will improve left shoulder PROM to [facility] for increased ease with donning and doffing clothing.   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    Time 4   Period Weeks   Status On-going   OT SHORT TERM GOAL #4   Title Patient will decrease left shoulder and neck pain to 7/10 or better when working.   Time 4   Period Weeks   Status On-going   OT SHORT TERM GOAL #5   Title Patient will decrease left shoulder and neck pain fascial restrictions to moderate.   Time 4   Period Weeks   Status On-going           OT Long Term Goals - 02/18/15 1539    OT LONG TERM GOAL #1   Title Patient will return to prior level of independence with all B/IADLs, work, and leisure activities.    Time 8   Period Weeks   Status On-going   OT LONG TERM GOAL #2   Title Patient will improve cervical and left shoulder AROM to [facility] for increased ablity to reach overhead and behind her back.    Time 8   Period Weeks   Status On-going   OT LONG TERM GOAL #3   Title Patient will improve left shoulder strength to 4-/5 for increased abilty to lift bags of groceries.   Time 8   Period Weeks   Status On-going   OT LONG TERM GOAL #4   Title Patient will decrease pain in her left shoulder and neck region to 3/10 or better when completing daily activities.    Time 8   Period Weeks   Status On-going   OT LONG TERM GOAL #5   Title Patient will decrease fascial restrictions to minimal in her left shoudler and neck region.    Time 8   Period Weeks   Status On-going               Plan - 02/18/15 1539    Clinical Impression Statement A: Initiated myofascial release, manual stretching, and PROM this session. Pt with max fascial restrictions along upper arm, trapezius,  scapularis, and cervical regions. Pt very guarded during manual stretching. ES and moist heat applied at end of session for pain management. Patient tolerated treatment well.  Pt reports  increased pain with cervical home exercises. Did not facilitate cervical stretching/range of motion this date.     Plan P: Continue PROM. Follow up with patient on MD & spine/cervical concerns/appt.         Problem List Patient Active Problem List   Diagnosis Date Noted  . Palpitations 12/08/2014  . Chest pain 12/08/2014  . Obstructive sleep apnea 12/08/2014  . Incarcerated umbilical hernia 03/25/2014  . Cellulitis 02/19/2014  . Pelvic pain 07/15/2013  . Depression 07/15/2013  . Decreased libido 12/23/2012  . Dyspnea 11/12/2012  . Headache 10/14/2012  . Neuropathy 10/14/2012  . Candidal intertrigo 09/12/2012  . Diabetes mellitus type II, uncontrolled 05/10/2012  . Hypertension 05/10/2012  . HYPERLIPIDEMIA 09/29/2008  . OBESITY 08/31/2008  . NEPHROLITHIASIS 08/31/2008    Ezra Sites, OTR/L [phone]  02/18/2015, 3:45 PM  [facility] [address] [place], [place], [address] Phone: [phone]   Fax:  [phone]

## 2015-02-22 ENCOUNTER — Ambulatory Visit (HOSPITAL_BASED_OUTPATIENT_CLINIC_OR_DEPARTMENT_OTHER): Payer: Managed Care, Other (non HMO) | Attending: Cardiovascular Disease

## 2015-02-23 ENCOUNTER — Ambulatory Visit (HOSPITAL_COMMUNITY): Payer: Managed Care, Other (non HMO) | Admitting: Occupational Therapy

## 2015-02-23 ENCOUNTER — Ambulatory Visit (HOSPITAL_COMMUNITY): Payer: Managed Care, Other (non HMO) | Admitting: Physical Therapy

## 2015-02-25 ENCOUNTER — Ambulatory Visit (HOSPITAL_COMMUNITY): Payer: Managed Care, Other (non HMO) | Admitting: Occupational Therapy

## 2015-02-28 ENCOUNTER — Ambulatory Visit (HOSPITAL_COMMUNITY): Payer: Managed Care, Other (non HMO)

## 2015-02-28 ENCOUNTER — Encounter (HOSPITAL_COMMUNITY): Payer: Self-pay

## 2015-02-28 ENCOUNTER — Ambulatory Visit (HOSPITAL_COMMUNITY): Payer: Managed Care, Other (non HMO) | Admitting: Physical Therapy

## 2015-02-28 DIAGNOSIS — M5489 Other dorsalgia: Secondary | ICD-10-CM

## 2015-02-28 DIAGNOSIS — R531 Weakness: Secondary | ICD-10-CM

## 2015-02-28 DIAGNOSIS — R29898 Other symptoms and signs involving the musculoskeletal system: Secondary | ICD-10-CM

## 2015-02-28 DIAGNOSIS — R269 Unspecified abnormalities of gait and mobility: Secondary | ICD-10-CM

## 2015-02-28 DIAGNOSIS — R201 Hypoesthesia of skin: Secondary | ICD-10-CM

## 2015-02-28 DIAGNOSIS — M25619 Stiffness of unspecified shoulder, not elsewhere classified: Secondary | ICD-10-CM

## 2015-02-28 DIAGNOSIS — M25561 Pain in right knee: Secondary | ICD-10-CM

## 2015-02-28 DIAGNOSIS — M25512 Pain in left shoulder: Secondary | ICD-10-CM

## 2015-02-28 NOTE — Therapy (Signed)
[facility] [address] [place], [place], [address] Phone: [phone]   Fax:  [phone]  Physical Therapy Treatment  Patient Details  Name: [name] MRN: [id number] Date of Birth: 04-Apr-1966 Referring Provider:  Catalina Pizza, MD  Encounter Date: 02/28/2015      PT End of Session - 02/28/15 1730    Visit Number 2   Number of Visits 18   Date for PT Re-Evaluation 03/03/15   Authorization Type Aet[employer]    Authorization Time Period 02/10/15 to 04/12/15 (60 visit limit; also seeing OT)   Authorization - Visit Number 2   PT Start Time 1445   PT Stop Time 1515   PT Time Calculation (min) 30 min   Activity Tolerance Patient limited by pain;Patient tolerated treatment well   Behavior During Therapy [facility] for tasks assessed/performed      Past Medical History  Diagnosis Date  . Diabetes mellitus     insulin pump 2013  . Neuropathy   . Hypertension   . Hypercholesteremia   . Shortness of breath   . Kidney stone   . Palpitations   . Obesity   . Obstructive sleep apnea     Past Surgical History  Procedure Laterality Date  . Cesarean section  1994;2000    [place]  . Cholecystectomy  1992  . Sinus sergery  1988    [place]  . Foot surgery Right March, 2013    [facility]-removal of bone spur  . Hysteroscopy with thermachoice  04/25/2012    Procedure: HYSTEROSCOPY WITH THERMACHOICE;  Surgeon: Lazaro Arms, MD;  Location: [facility];  Service: Gynecology;  Laterality: N/A;  total therapy time= 11 minutes 42 seconds; 34 ml D5w  in and 34 ml D5w out; temp =87 degrees F  . Cystoscopy w/ ureteral stent placement  05/08/2012    Procedure: CYSTOSCOPY WITH RETROGRADE PYELOGRAM/URETERAL STENT PLACEMENT;  Surgeon: Ky Barban, MD;  Location: [facility];  Service: Urology;  Laterality: Right;  . Umbilical hernia repair N/A 03/25/2014    Procedure: UMBILICAL HERNIA REPAIR;  Surgeon: Marlane Hatcher, MD;  Location: [facility];  Service:  General;  Laterality: N/A;  . Uterine ablation    . Left heart catheterization with coronary angiogram N/A 12/11/2012    Procedure: LEFT HEART CATHETERIZATION WITH CORONARY ANGIOGRAM;  Surgeon: Runell Gess, MD;  Location: [facility] CATH LAB;  Service: Cardiovascular;  Laterality: N/A;  . Cardiac catheterization  12/11/2012    normal coronary arteries    There were no vitals filed for this visit.  Visit Diagnosis:  Knee pain, right  Right leg weakness  Generalized weakness  Reduced sensation  Abnormality of gait  Back pain without sciatica      Subjective Assessment - 02/28/15 1729    Symptoms Pt was 15 minutes late for appointment.  States she forgot.  States she is still having lumbar pain.  Dr. Margo Aye has not contacted her regarding her neurology consult for her back.    Currently in Pain? Yes   Pain Score 9    Pain Location Back                       [facility] Adult PT Treatment/Exercise - 02/28/15 1505    Lumbar Exercises: Supine   Bridge 10 reps   Straight Leg Raise 10 reps   Lumbar Exercises: Sidelying   Hip Abduction 10 reps   Hip Abduction Limitations bilaterally   Lumbar Exercises: Prone  Straight Leg Raise 10 reps   Other Prone Lumbar Exercises hamstring curls 10 reps                  PT Short Term Goals - 02/10/15 1640    PT SHORT TERM GOAL #1   Title Patient will experience no more than 6/10 pain with sitting, standing, and gait based activities in order to allow her to participate in functional work tasks   Time 3   Period Weeks   Status New   PT SHORT TERM GOAL #2   Title Patient will demonstrate the ability to sit for at least 60 minutes and stand for at least 30 minutes with pain no more than 6/10   Time 3   Period Weeks   Status New   PT SHORT TERM GOAL #3   Title Patient will demonstrate an increase of at least 7 degrees in all planes of motion in the lumbar and thoracic spines   Time 3   Period Weeks   Status New   PT SHORT  TERM GOAL #4   Title Patient will demonstrate an increase in strength of at least one muscle grade in entire R leg in order to reduce pain and allow patient to participate in functional tasks   Time 3   Period Weeks   Status New   PT SHORT TERM GOAL #5   Title Patient will demonstrate bilateral hip internal rotation of at least 35 degrees and bilateral hip external rotation of at least 40 degrees   Time 3   Period Weeks   Status New           PT Long Term Goals - 02/10/15 1642    PT LONG TERM GOAL #1   Title Patient will demonstrate the ability to correctly and consistently perform appropriate HEP in order to maintain functional gains and prevent recurrence of pain   Time 6   Period Weeks   Status New   PT LONG TERM GOAL #2   Title Patient will demonstrate the abilty to perform sitting tasks for at least 2 hours with pain no more than 2/10   Time 6   Period Weeks   Status New   PT LONG TERM GOAL #3   Title Patient will demonstrate the ability to remain in an upright weight bearing position for at least 3 hours and ambulate at least 90 minutes in order to increase functional task and work performance with pain no more than 2/10   Time 6   Period Weeks   Status New   PT LONG TERM GOAL #4   Title Patient will demonstrate the ability to perform full squat and lift at least 20 pounds in order to 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  Pt was able to complete all exercises without c/o pain.   Pt is to follow up with MD regarding  neurology consult.     PT Next Visit Plan TO KRISTEN. Further assess hip strength and range of motion. Special tests for neuro, further investigate cause of symptoms. Gentle mobility of hips, lumbar and thoracic spines. Cautious strengthening of bilateral legs.         Problem List Patient Active Problem List   Diagnosis Date Noted  . Palpitations 12/08/2014  . Chest pain 12/08/2014  . Obstructive sleep apnea 12/08/2014  . Incarcerated umbilical hernia 03/25/2014  . Cellulitis 02/19/2014  . Pelvic pain 07/15/2013  . Depression 07/15/2013  . Decreased libido 12/23/2012  . Dyspnea 11/12/2012  . Headache 10/14/2012  . Neuropathy 10/14/2012  . Candidal intertrigo 09/12/2012  . Diabetes mellitus type II, uncontrolled 05/10/2012  . Hypertension 05/10/2012  . HYPERLIPIDEMIA 09/29/2008  . OBESITY 08/31/2008  . NEPHROLITHIASIS 08/31/2008    Lurena Nida, PTA/CLT [phone] 02/28/2015, 5:33 PM  [facility] [address] [place], [place], [address] Phone: [phone]   Fax:  [phone]

## 2015-02-28 NOTE — Therapy (Signed)
[facility] [address] [place], [place], [address] Phone: [phone]   Fax:  [phone]  Occupational Therapy Treatment  Patient Details  Name: [name] MRN: [id number] Date of Birth: 09-13-1966 Referring Provider:  Catalina Pizza, MD  Encounter Date: 02/28/2015      OT End of Session - 02/28/15 1621    Visit Number 3   Number of Visits 16   Date for OT Re-Evaluation 04/08/15  mini reassess 3/28   Authorization Type AETNA   Authorization Time Period  60 visits - will have PT as well   Authorization - Visit Number 3   Authorization - Number of Visits 60   OT Start Time 1517   OT Stop Time 1610   OT Time Calculation (min) 53 min   Activity Tolerance Patient tolerated treatment well;Patient limited by pain   Behavior During Therapy [facility] for tasks assessed/performed      Past Medical History  Diagnosis Date  . Diabetes mellitus     insulin pump 2013  . Neuropathy   . Hypertension   . Hypercholesteremia   . Shortness of breath   . Kidney stone   . Palpitations   . Obesity   . Obstructive sleep apnea     Past Surgical History  Procedure Laterality Date  . Cesarean section  1994;2000    [place]  . Cholecystectomy  1992  . Sinus sergery  1988    [place]  . Foot surgery Right March, 2013    [facility]-removal of bone spur  . Hysteroscopy with thermachoice  04/25/2012    Procedure: HYSTEROSCOPY WITH THERMACHOICE;  Surgeon: Lazaro Arms, MD;  Location: [facility];  Service: Gynecology;  Laterality: N/A;  total therapy time= 11 minutes 42 seconds; 34 ml D5w  in and 34 ml D5w out; temp =87 degrees F  . Cystoscopy w/ ureteral stent placement  05/08/2012    Procedure: CYSTOSCOPY WITH RETROGRADE PYELOGRAM/URETERAL STENT PLACEMENT;  Surgeon: Ky Barban, MD;  Location: [facility];  Service: Urology;  Laterality: Right;  . Umbilical hernia repair N/A 03/25/2014    Procedure: UMBILICAL HERNIA REPAIR;  Surgeon: Marlane Hatcher, MD;  Location: [facility];  Service: General;  Laterality: N/A;  . Uterine ablation    . Left heart catheterization with coronary angiogram N/A 12/11/2012    Procedure: LEFT HEART CATHETERIZATION WITH CORONARY ANGIOGRAM;  Surgeon: Runell Gess, MD;  Location: [facility] CATH LAB;  Service: Cardiovascular;  Laterality: N/A;  . Cardiac catheterization  12/11/2012    normal coronary arteries    There were no vitals filed for this visit.  Visit Diagnosis:  No diagnosis found.      Subjective Assessment - 02/28/15 1603    Symptoms S: Pt said "I am getting between 2-3 hours of sleep at night due to pain."   Currently in Pain? Yes   Pain Score 9    Pain Location Shoulder   Pain Orientation Left   Pain Descriptors / Indicators Constant            [facility] OT Assessment - 02/28/15 1609    Assessment   Diagnosis Left Shoulder and Cervical Pain   Precautions   Precautions None                OT Treatments/Exercises (OP) - 02/28/15 1610    Exercises   Exercises Shoulder   Shoulder Exercises: Supine   Protraction PROM;10 reps   Horizontal ABduction PROM;10 reps  External Rotation PROM;10 reps   Internal Rotation PROM;10 reps   Flexion PROM;10 reps   ABduction PROM;10 reps   Shoulder Exercises: Seated   Elevation AROM;10 reps   Extension AROM;10 reps   Row AROM;10 reps   Shoulder Exercises: Therapy Ball   Flexion 10 reps   ABduction 10 reps   Modalities   Modalities Electrical Stimulation;Moist Heat   Moist Heat Therapy   Number Minutes Moist Heat 10 Minutes   Moist Heat Location Other (comment)  cervical region   Electrical Stimulation   Electrical Stimulation Location cervical region   Electrical Stimulation Action Interferential   Electrical Stimulation Parameters 8.2 volts   Electrical Stimulation Goals Pain   Manual Therapy   Manual Therapy Myofascial release   Myofascial Release Myofascial release and manual stretching to left upper arm, scapular,  shoulder, trapezius, and sternocleidomastoid region to decrease pain and restrictions and improve pain free moblity. Muscle energy technique to upper trapezius and anterior deltoid to relax muscle and decrease tone to increase joint mobility                   OT Short Term Goals - 02/18/15 1538    OT SHORT TERM GOAL #1   Title Patient will be educated on a HEP.   Time 4   Period Weeks   Status On-going   OT SHORT TERM GOAL #2   Title Patient will improve left shoulder PROM to [facility] for increased ease with donning and doffing clothing.   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    Time 4   Period Weeks   Status On-going   OT SHORT TERM GOAL #4   Title Patient will decrease left shoulder and neck pain to 7/10 or better when working.   Time 4   Period Weeks   Status On-going   OT SHORT TERM GOAL #5   Title Patient will decrease left shoulder and neck pain fascial restrictions to moderate.   Time 4   Period Weeks   Status On-going           OT Long Term Goals - 02/18/15 1539    OT LONG TERM GOAL #1   Title Patient will return to prior level of independence with all B/IADLs, work, and leisure activities.    Time 8   Period Weeks   Status On-going   OT LONG TERM GOAL #2   Title Patient will improve cervical and left shoulder AROM to [facility] for increased ablity to reach overhead and behind her back.    Time 8   Period Weeks   Status On-going   OT LONG TERM GOAL #3   Title Patient will improve left shoulder strength to 4-/5 for increased abilty to lift bags of groceries.   Time 8   Period Weeks   Status On-going   OT LONG TERM GOAL #4   Title Patient will decrease pain in her left shoulder and neck region to 3/10 or better when completing daily activities.    Time 8   Period Weeks   Status On-going   OT LONG TERM GOAL #5   Title  Patient will decrease fascial restrictions to minimal in her left shoudler and neck region.    Time 8   Period Weeks   Status On-going  Plan - 02/28/15 1624    Clinical Impression Statement A: Added AROM shoulder elevation, extension, row.  Initiated muscle energy technique resulting in slight increase in ROM.  At the end of session pt reported pain being an 8/10.   Plan P:  Work on decreasing pain and increasing PROM.  Add cervical spine stretches.        Problem List Patient Active Problem List   Diagnosis Date Noted  . Palpitations 12/08/2014  . Chest pain 12/08/2014  . Obstructive sleep apnea 12/08/2014  . Incarcerated umbilical hernia 03/25/2014  . Cellulitis 02/19/2014  . Pelvic pain 07/15/2013  . Depression 07/15/2013  . Decreased libido 12/23/2012  . Dyspnea 11/12/2012  . Headache 10/14/2012  . Neuropathy 10/14/2012  . Candidal intertrigo 09/12/2012  . Diabetes mellitus type II, uncontrolled 05/10/2012  . Hypertension 05/10/2012  . HYPERLIPIDEMIA 09/29/2008  . OBESITY 08/31/2008  . NEPHROLITHIASIS 08/31/2008    Limmie Patricia, OTR/L,CBIS  [phone]  02/28/2015, 4:30 PM  [facility] [address] [place], [place], [address] Phone: [phone]   Fax:  [phone]

## 2015-03-03 ENCOUNTER — Ambulatory Visit (HOSPITAL_COMMUNITY): Payer: Managed Care, Other (non HMO) | Admitting: Physical Therapy

## 2015-03-03 ENCOUNTER — Ambulatory Visit (HOSPITAL_COMMUNITY): Payer: Managed Care, Other (non HMO) | Admitting: Occupational Therapy

## 2015-03-03 ENCOUNTER — Ambulatory Visit: Payer: Managed Care, Other (non HMO) | Admitting: Orthopedic Surgery

## 2015-03-03 ENCOUNTER — Encounter: Payer: Self-pay | Admitting: Orthopedic Surgery

## 2015-03-03 DIAGNOSIS — M25512 Pain in left shoulder: Secondary | ICD-10-CM | POA: Diagnosis not present

## 2015-03-03 DIAGNOSIS — R29898 Other symptoms and signs involving the musculoskeletal system: Secondary | ICD-10-CM

## 2015-03-03 DIAGNOSIS — M25619 Stiffness of unspecified shoulder, not elsewhere classified: Secondary | ICD-10-CM

## 2015-03-03 DIAGNOSIS — R531 Weakness: Secondary | ICD-10-CM

## 2015-03-03 DIAGNOSIS — R201 Hypoesthesia of skin: Secondary | ICD-10-CM

## 2015-03-03 DIAGNOSIS — R269 Unspecified abnormalities of gait and mobility: Secondary | ICD-10-CM

## 2015-03-03 DIAGNOSIS — M5489 Other dorsalgia: Secondary | ICD-10-CM

## 2015-03-03 DIAGNOSIS — M25561 Pain in right knee: Secondary | ICD-10-CM

## 2015-03-03 NOTE — Therapy (Signed)
[facility] [address] [place], [place], [address] Phone: [phone]   Fax:  [phone]  Physical Therapy Treatment  Patient Details  Name: [name] MRN: [id number] Date of Birth: 1966/07/30 Referring Provider:  Catalina Pizza, MD  Encounter Date: 03/03/2015      PT End of Session - 03/03/15 1613    Visit Number 3   Number of Visits 18   Date for PT Re-Evaluation 03/31/15   Authorization Type [employer]    Authorization Time Period 02/10/15 to 04/12/15 (60 visit limit; also seeing OT)   Authorization - Visit Number 3   Activity Tolerance Patient tolerated treatment well;Patient limited by pain   Behavior During Therapy [facility] for tasks assessed/performed      Past Medical History  Diagnosis Date  . Diabetes mellitus     insulin pump 2013  . Neuropathy   . Hypertension   . Hypercholesteremia   . Shortness of breath   . Kidney stone   . Palpitations   . Obesity   . Obstructive sleep apnea     Past Surgical History  Procedure Laterality Date  . Cesarean section  1994;2000    [place]  . Cholecystectomy  1992  . Sinus sergery  1988    [place]  . Foot surgery Right March, 2013    [facility]-removal of bone spur  . Hysteroscopy with thermachoice  04/25/2012    Procedure: HYSTEROSCOPY WITH THERMACHOICE;  Surgeon: Lazaro Arms, MD;  Location: [facility];  Service: Gynecology;  Laterality: N/A;  total therapy time= 11 minutes 42 seconds; 34 ml D5w  in and 34 ml D5w out; temp =87 degrees F  . Cystoscopy w/ ureteral stent placement  05/08/2012    Procedure: CYSTOSCOPY WITH RETROGRADE PYELOGRAM/URETERAL STENT PLACEMENT;  Surgeon: Ky Barban, MD;  Location: [facility];  Service: Urology;  Laterality: Right;  . Umbilical hernia repair N/A 03/25/2014    Procedure: UMBILICAL HERNIA REPAIR;  Surgeon: Marlane Hatcher, MD;  Location: [facility];  Service: General;  Laterality: N/A;  . Uterine ablation    . Left heart catheterization  with coronary angiogram N/A 12/11/2012    Procedure: LEFT HEART CATHETERIZATION WITH CORONARY ANGIOGRAM;  Surgeon: Runell Gess, MD;  Location: [facility] CATH LAB;  Service: Cardiovascular;  Laterality: N/A;  . Cardiac catheterization  12/11/2012    normal coronary arteries    There were no vitals filed for this visit.  Visit Diagnosis:  Back pain without sciatica  Knee pain, right  Right leg weakness  Generalized weakness  Reduced sensation  Abnormality of gait      Subjective Assessment - 03/03/15 1531    Symptoms Patient arrived on time for appointment, has still not followed up with MD regarding appointment to investigate pain. PT encouraged patient to make appointment with MD regarding symptoms, as they have not changed since time of initial evaluation.   Pertinent History sudden onset in late [place]; patient has had multiple falls out of bed and husband has been having to help her get around. Keeps patient up at night; 4 hours of sleep on average. High level of assist from husband.    Currently in Pain? Yes   Pain Score 8    Pain Location Neck  back and neck            [facility] PT Assessment - 03/03/15 1449    Precautions   Precautions None   AROM   Right Hip External Rotation  30  Right Hip Internal Rotation  30   Left Hip External Rotation  29   Left Hip Internal Rotation  40   Lumbar Flexion 33   Lumbar Extension 12   Lumbar - Right Side Bend 16   Lumbar - Left Side Bend 24   Thoracic Flexion 13   Thoracic Extension 8   Thoracic - Right Rotation 28   Thoracic - Left Rotation 34   Strength   Right Hip Flexion 2/5   Right Hip Extension 2+/5   Right Hip ABduction 2/5   Left Hip Flexion 3/5   Left Hip Extension 2+/5   Left Hip ABduction 2/5   Right Knee Flexion 2+/5   Right Knee Extension 3/5   Left Knee Flexion 3+/5   Left Knee Extension 4-/5   Right Ankle Dorsiflexion 3/5   Left Ankle Dorsiflexion 3+/5                   [facility] Adult PT  Treatment/Exercise - 03/03/15 1544    Lumbar Exercises: Standing   Forward Lunge 10 reps   Forward Lunge Limitations Forward lunges onto box with spinal extension; spinal rotation; spinal flexion (separate movements, not combined spinal motions)   Other Standing Lumbar Exercises 3D hip and thoracic excursions 1x10   Shoulder Exercises: Supine   Protraction --   Horizontal ABduction --   External Rotation --   Internal Rotation --   Flexion --   ABduction --   Manual Therapy   Manual Therapy --   Myofascial Release --                PT Education - 03/03/15 1612    Education provided Yes   Education Details Education regarding importance of contacting MD for appointment to further assess symptoms; importance of good form during exercise   Person(s) Educated Patient   Methods Explanation   Comprehension Verbalized understanding          PT Short Term Goals - 03/03/15 1613    PT SHORT TERM GOAL #1   Title Patient will experience no more than 6/10 pain with sitting, standing, and gait based activities in order to allow her to participate in functional work tasks   Baseline 3/24- 9/10 pain   Time 3   Period Weeks   Status On-going   PT SHORT TERM GOAL #2   Title Patient will demonstrate the ability to sit for at least 60 minutes and stand for at least 30 minutes with pain no more than 6/10   Time 3   Period Weeks   Status On-going   PT SHORT TERM GOAL #3   Title Patient will demonstrate an increase of at least 7 degrees in all planes of motion in the lumbar and thoracic spines   Time 3   Period Weeks   Status On-going   PT SHORT TERM GOAL #4   Title Patient will demonstrate an increase in strength of at least one muscle grade in entire R leg in order to reduce pain and allow patient to participate in functional tasks   Time 3   Period Weeks   Status On-going   PT SHORT TERM GOAL #5   Title Patient will demonstrate bilateral hip internal rotation of at least 35  degrees and bilateral hip external rotation of at least 40 degrees   Time 3   Period Weeks   Status On-going           PT Long Term Goals -  03/03/15 1614    PT LONG TERM GOAL #1   Title Patient will demonstrate the ability to correctly and consistently perform appropriate HEP in order to maintain functional gains and prevent recurrence of pain   Time 6   Period Weeks   Status On-going   PT LONG TERM GOAL #2   Title Patient will demonstrate the abilty to perform sitting tasks for at least 2 hours with pain no more than 2/10   Time 6   Period Weeks   Status On-going   PT LONG TERM GOAL #3   Title Patient will demonstrate the ability to remain in an upright weight bearing position for at least 3 hours and ambulate at least 90 minutes in order to increase functional task and work performance with pain no more than 2/10   Time 6   Period Weeks   Status On-going   PT LONG TERM GOAL #4   Title Patient will demonstrate the ability to perform full squat and lift at least 20 pounds in order to 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 Patient has only attended two sessions after initial evaluation, including today's session; patient has not at this point called to make an appointment with her doctor as PT recommended after initial evaluation- patient states that she had not done so yet because her husband was recently in motor vehicle accident and things have been very busy. Patient again encouraged to call MD for appointment to further investigate symptoms in back. Patient will continue to  benefit from skilled PT services in order to assist her in reducing pain, improving gait mechanics, improving functional activity tolerance and biomechanics, improving work tolerance, and to assist her in reaching optimal level of function.   Pt will benefit from skilled therapeutic intervention in order to improve on the following deficits Abnormal gait;Decreased endurance;Impaired perceived functional ability;Improper body mechanics;Decreased activity tolerance;Decreased strength;Impaired flexibility;Postural dysfunction;Decreased balance;Decreased mobility;Difficulty walking;Impaired sensation;Obesity;Decreased range of motion;Pain;Decreased coordination;Decreased safety awareness   Rehab Potential Fair   PT Frequency 3x / week   PT Duration 6 weeks   PT Treatment/Interventions ADLs/Self Care Home Management;Gait training;Neuromuscular re-education;Stair training;Functional mobility training;Patient/family education;Passive range of motion;Cryotherapy;Therapeutic activities;Electrical Stimulation;Therapeutic exercise;Manual techniques;Energy conservation;Moist Heat;DME Instruction;Balance training   PT Next Visit Plan Ask if pt has made MD appointment. Gentle mobility of hips/lumbar/thoracic spines; functional strengthening   PT Home Exercise Plan given   Consulted and Agree with Plan of Care Patient        Problem List Patient Active Problem List   Diagnosis Date Noted  . Palpitations 12/08/2014  . Chest pain 12/08/2014  . Obstructive sleep apnea 12/08/2014  . Incarcerated umbilical hernia 03/25/2014  . Cellulitis 02/19/2014  . Pelvic pain 07/15/2013  . Depression 07/15/2013  . Decreased libido 12/23/2012  . Dyspnea 11/12/2012  . Headache 10/14/2012  . Neuropathy 10/14/2012  . Candidal intertrigo 09/12/2012  . Diabetes mellitus type II, uncontrolled 05/10/2012  . Hypertension 05/10/2012  . HYPERLIPIDEMIA 09/29/2008  .  OBESITY 08/31/2008  . NEPHROLITHIASIS 08/31/2008    Nedra Hai PT, DPT [phone]  [facility] [address] [place], [place], [address] Phone: [phone]   Fax:  [phone]

## 2015-03-03 NOTE — Therapy (Signed)
[facility] [address] [place], [place], [address] Phone: [phone]   Fax:  [phone]  Occupational Therapy Treatment  Patient Details  Name: [name] MRN: [id number] Date of Birth: [birth year] Referring Provider:  Catalina Pizza, MD  Encounter Date: 03/03/2015      OT End of Session - 03/03/15 1435    Visit Number 4   Number of Visits 16   Date for OT Re-Evaluation 04/08/15  mini reassess 3/28   Authorization Type AETNA   Authorization Time Period  60 visits - will have PT as well   Authorization - Visit Number 4   Authorization - Number of Visits 60   OT Start Time 1348   OT Stop Time 1430   OT Time Calculation (min) 42 min   Activity Tolerance Patient tolerated treatment well;Patient limited by pain   Behavior During Therapy [facility] for tasks assessed/performed      Past Medical History  Diagnosis Date  . Diabetes mellitus     insulin pump 2013  . Neuropathy   . Hypertension   . Hypercholesteremia   . Shortness of breath   . Kidney stone   . Palpitations   . Obesity   . Obstructive sleep apnea     Past Surgical History  Procedure Laterality Date  . Cesarean section  1994;2000    [place]  . Cholecystectomy  1992  . Sinus sergery  1988    [place]  . Foot surgery Right March, 2013    [facility]-removal of bone spur  . Hysteroscopy with thermachoice  04/25/2012    Procedure: HYSTEROSCOPY WITH THERMACHOICE;  Surgeon: Lazaro Arms, MD;  Location: [facility];  Service: Gynecology;  Laterality: N/A;  total therapy time= 11 minutes 42 seconds; 34 ml D5w  in and 34 ml D5w out; temp =87 degrees F  . Cystoscopy w/ ureteral stent placement  05/08/2012    Procedure: CYSTOSCOPY WITH RETROGRADE PYELOGRAM/URETERAL STENT PLACEMENT;  Surgeon: Ky Barban, MD;  Location: [facility];  Service: Urology;  Laterality: Right;  . Umbilical hernia repair N/A 03/25/2014    Procedure: UMBILICAL HERNIA REPAIR;  Surgeon: Marlane Hatcher, MD;  Location: [facility];  Service: General;  Laterality: N/A;  . Uterine ablation    . Left heart catheterization with coronary angiogram N/A 12/11/2012    Procedure: LEFT HEART CATHETERIZATION WITH CORONARY ANGIOGRAM;  Surgeon: Runell Gess, MD;  Location: [facility] CATH LAB;  Service: Cardiovascular;  Laterality: N/A;  . Cardiac catheterization  12/11/2012    normal coronary arteries    There were no vitals filed for this visit.  Visit Diagnosis:  Pain in joint, shoulder region, left  Decreased range of motion (ROM) of shoulder  Shoulder weakness      Subjective Assessment - 03/03/15 1439    Symptoms S: I still haven't been able to get through to talk to the doctor.    Currently in Pain? Yes   Pain Score 8    Pain Location Neck  shoulder, back   Pain Orientation Left   Pain Descriptors / Indicators Constant   Pain Type Acute pain            [facility] OT Assessment - 03/03/15 1434    Assessment   Diagnosis Left Shoulder and Cervical Pain   Precautions   Precautions None                OT Treatments/Exercises (OP) - 03/03/15 1423    Exercises  Exercises Shoulder;Neck   Neck Exercises: Stretches   Upper Trapezius Stretch 3 reps;10 seconds   Levator Stretch 3 reps;10 seconds   Neck Stretch 3 reps;10 seconds   Lower Cervical/Upper Thoracic Stretch 3 reps;10 seconds   Shoulder Exercises: Supine   Protraction PROM;10 reps   Horizontal ABduction PROM;10 reps   External Rotation PROM;10 reps   Internal Rotation PROM;10 reps   Flexion PROM;10 reps   ABduction PROM;10 reps   Manual Therapy   Manual Therapy Myofascial release   Myofascial Release Myofascial release and manual stretching to left upper arm, scapular, shoulder, trapezius, and sternocleidomastoid region to decrease pain and restrictions and improve pain free moblity. Muscle energy technique to upper trapezius and anterior deltoid to relax muscle and decrease tone to increase joint mobility                    OT Short Term Goals - 02/18/15 1538    OT SHORT TERM GOAL #1   Title Patient will be educated on a HEP.   Time 4   Period Weeks   Status On-going   OT SHORT TERM GOAL #2   Title Patient will improve left shoulder PROM to [facility] for increased ease with donning and doffing clothing.   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    Time 4   Period Weeks   Status On-going   OT SHORT TERM GOAL #4   Title Patient will decrease left shoulder and neck pain to 7/10 or better when working.   Time 4   Period Weeks   Status On-going   OT SHORT TERM GOAL #5   Title Patient will decrease left shoulder and neck pain fascial restrictions to moderate.   Time 4   Period Weeks   Status On-going           OT Long Term Goals - 02/18/15 1539    OT LONG TERM GOAL #1   Title Patient will return to prior level of independence with all B/IADLs, work, and leisure activities.    Time 8   Period Weeks   Status On-going   OT LONG TERM GOAL #2   Title Patient will improve cervical and left shoulder AROM to [facility] for increased ablity to reach overhead and behind her back.    Time 8   Period Weeks   Status On-going   OT LONG TERM GOAL #3   Title Patient will improve left shoulder strength to 4-/5 for increased abilty to lift bags of groceries.   Time 8   Period Weeks   Status On-going   OT LONG TERM GOAL #4   Title Patient will decrease pain in her left shoulder and neck region to 3/10 or better when completing daily activities.    Time 8   Period Weeks   Status On-going   OT LONG TERM GOAL #5   Title Patient will decrease fascial restrictions to minimal in her left shoudler and neck region.    Time 8   Period Weeks   Status On-going               Plan - 03/03/15 1435    Clinical Impression Statement A: Continued myofascial  release and manual stretching. Added cervical spine stretches. Pt tolerated treatment well. Pt reports she still has been able  to make an appointment with her PCP. Pt is going to call [facility] to determine if she can make own appointment or if needs a referral from PCP. Pt continues to report difficulty sleeping & pain in shoulder & cervical region.    Plan P: Work on increasing PROM & decreasing pain. Resume therapy ball, continue cervical spine stretches.         Problem List Patient Active Problem List   Diagnosis Date Noted  . Palpitations 12/08/2014  . Chest pain 12/08/2014  . Obstructive sleep apnea 12/08/2014  . Incarcerated umbilical hernia 03/25/2014  . Cellulitis 02/19/2014  . Pelvic pain 07/15/2013  . Depression 07/15/2013  . Decreased libido 12/23/2012  . Dyspnea 11/12/2012  . Headache 10/14/2012  . Neuropathy 10/14/2012  . Candidal intertrigo 09/12/2012  . Diabetes mellitus type II, uncontrolled 05/10/2012  . Hypertension 05/10/2012  . HYPERLIPIDEMIA 09/29/2008  . OBESITY 08/31/2008  . NEPHROLITHIASIS 08/31/2008    Ezra Sites, OTR/L  [phone]  03/03/2015, 2:40 PM  [facility] [address] [place], [place], [address] Phone: [phone]   Fax:  [phone]

## 2015-03-08 ENCOUNTER — Ambulatory Visit (HOSPITAL_COMMUNITY): Payer: Managed Care, Other (non HMO) | Admitting: Occupational Therapy

## 2015-03-08 ENCOUNTER — Encounter (HOSPITAL_COMMUNITY): Payer: Self-pay | Admitting: Occupational Therapy

## 2015-03-08 ENCOUNTER — Ambulatory Visit (HOSPITAL_COMMUNITY): Payer: Managed Care, Other (non HMO) | Admitting: Physical Therapy

## 2015-03-08 DIAGNOSIS — R29898 Other symptoms and signs involving the musculoskeletal system: Secondary | ICD-10-CM

## 2015-03-08 DIAGNOSIS — M25619 Stiffness of unspecified shoulder, not elsewhere classified: Secondary | ICD-10-CM

## 2015-03-08 DIAGNOSIS — M25512 Pain in left shoulder: Secondary | ICD-10-CM | POA: Diagnosis not present

## 2015-03-08 DIAGNOSIS — R531 Weakness: Secondary | ICD-10-CM

## 2015-03-08 DIAGNOSIS — M542 Cervicalgia: Secondary | ICD-10-CM

## 2015-03-08 DIAGNOSIS — M25561 Pain in right knee: Secondary | ICD-10-CM

## 2015-03-08 DIAGNOSIS — R201 Hypoesthesia of skin: Secondary | ICD-10-CM

## 2015-03-08 DIAGNOSIS — R269 Unspecified abnormalities of gait and mobility: Secondary | ICD-10-CM

## 2015-03-08 DIAGNOSIS — M5489 Other dorsalgia: Secondary | ICD-10-CM

## 2015-03-08 NOTE — Therapy (Signed)
[facility] [address] [place], [place], [address] Phone: [phone]   Fax:  [phone]  Physical Therapy Treatment  Patient Details  Name: [name] MRN: [id number] Date of Birth: Oct 23, 1966 Referring Provider:  Catalina Pizza, MD  Encounter Date: 03/08/2015      PT End of Session - 03/08/15 1631    Visit Number 4   Number of Visits 18   Date for PT Re-Evaluation 03/31/15   Authorization Type [employer]    Authorization Time Period 02/10/15 to 04/12/15 (60 visit limit; also seeing OT)   Authorization - Visit Number 4   Activity Tolerance Patient limited by pain   Behavior During Therapy [facility] for tasks assessed/performed      Past Medical History  Diagnosis Date  . Diabetes mellitus     insulin pump 2013  . Neuropathy   . Hypertension   . Hypercholesteremia   . Shortness of breath   . Kidney stone   . Palpitations   . Obesity   . Obstructive sleep apnea     Past Surgical History  Procedure Laterality Date  . Cesarean section  1994;2000    [place]  . Cholecystectomy  1992  . Sinus sergery  1988    [place]  . Foot surgery Right March, 2013    [facility]-removal of bone spur  . Hysteroscopy with thermachoice  04/25/2012    Procedure: HYSTEROSCOPY WITH THERMACHOICE;  Surgeon: Lazaro Arms, MD;  Location: [facility];  Service: Gynecology;  Laterality: N/A;  total therapy time= 11 minutes 42 seconds; 34 ml D5w  in and 34 ml D5w out; temp =87 degrees F  . Cystoscopy w/ ureteral stent placement  05/08/2012    Procedure: CYSTOSCOPY WITH RETROGRADE PYELOGRAM/URETERAL STENT PLACEMENT;  Surgeon: Ky Barban, MD;  Location: [facility];  Service: Urology;  Laterality: Right;  . Umbilical hernia repair N/A 03/25/2014    Procedure: UMBILICAL HERNIA REPAIR;  Surgeon: Marlane Hatcher, MD;  Location: [facility];  Service: General;  Laterality: N/A;  . Uterine ablation    . Left heart catheterization with coronary angiogram N/A 12/11/2012     Procedure: LEFT HEART CATHETERIZATION WITH CORONARY ANGIOGRAM;  Surgeon: Runell Gess, MD;  Location: [facility] CATH LAB;  Service: Cardiovascular;  Laterality: N/A;  . Cardiac catheterization  12/11/2012    normal coronary arteries    There were no vitals filed for this visit.  Visit Diagnosis:  Back pain without sciatica  Knee pain, right  Right leg weakness  Generalized weakness  Reduced sensation  Abnormality of gait      Subjective Assessment - 03/08/15 1615    Symptoms Patient states that she went to her doctor but that he did not schedule her for imaging; states that she is planning to see her neuro doctor to try and get an official order for imaging pushed through. Patient encouraged to keep talking to doctors until she gets imaging done on her back. Patient also states that  she  was bent over to wash dishes and was "paralyzed" and couldn't move, had to have her husband and son carry her to recliner where it took her 20 minutes to "relax" and be able to move again.    Pertinent History sudden onset in late [place]; patient has had multiple falls out of bed and husband has been having to help her get around. Keeps patient up at night; 4 hours of sleep on average. High level of assist from husband.  Currently in Pain? Yes   Pain Score 9    Pain Location Back                       [facility] Adult PT Treatment/Exercise - 03/08/15 1617    Lumbar Exercises: Stretches   Active Hamstring Stretch 3 reps;30 seconds   Active Hamstring Stretch Limitations on stairs   Passive Hamstring Stretch 3 reps;30 seconds   Passive Hamstring Stretch Limitations on stairs; initially attempted on slantboard but patient had shooting pain up the back of her legs   Piriformis Stretch 2 reps;30 seconds   Piriformis Stretch Limitations supine   Lumbar Exercises: Standing   Forward Lunge 10 reps   Forward Lunge Limitations 4 inch step   Other Standing Lumbar Exercises 3D hip and thoracic  excursions 1x10; step ups onto 4 inch step with bilateral HHA; repeated lumbar flexion/extension with increased pain with flexion, no changes in pain with extension   Other Standing Lumbar Exercises Attempted standing hip ABD and extension with pain in back and stance leg both attempts   Lumbar Exercises: Supine   Other Supine Lumbar Exercises Identified and attempted to correct leg length discrepancy with partial success   Shoulder Exercises: Supine   Protraction --   Horizontal ABduction --   External Rotation --   Internal Rotation --   Flexion --   ABduction --   Modalities   Modalities --   Moist Heat Therapy   Moist Heat Location --   Electrical Stimulation   Electrical Stimulation Location --   Electrical Stimulation Goals --   Manual Therapy   Manual Therapy --   Myofascial Release --                PT Education - 03/08/15 1630    Education provided Yes   Education Details Education provided regarding importance of continuing to speak with MD for appointment for imaging for back; importance of obtaining images in order to further direct PT plan of care; patient instructed to try performing back extension in standing as tolerated at home    Person(s) Educated Patient   Methods Explanation   Comprehension Verbalized understanding;Need further instruction          PT Short Term Goals - 03/03/15 1613    PT SHORT TERM GOAL #1   Title Patient will experience no more than 6/10 pain with sitting, standing, and gait based activities in order to allow her to participate in functional work tasks   Baseline 3/24- 9/10 pain   Time 3   Period Weeks   Status On-going   PT SHORT TERM GOAL #2   Title Patient will demonstrate the ability to sit for at least 60 minutes and stand for at least 30 minutes with pain no more than 6/10   Time 3   Period Weeks   Status On-going   PT SHORT TERM GOAL #3   Title Patient will demonstrate an increase of at least 7 degrees in all  planes of motion in the lumbar and thoracic spines   Time 3   Period Weeks   Status On-going   PT SHORT TERM GOAL #4   Title Patient will demonstrate an increase in strength of at least one muscle grade in entire R leg in order to reduce pain and allow patient to participate in functional tasks   Time 3   Period Weeks   Status On-going   PT SHORT TERM GOAL #5  Title Patient will demonstrate bilateral hip internal rotation of at least 35 degrees and bilateral hip external rotation of at least 40 degrees   Time 3   Period Weeks   Status On-going           PT Long Term Goals - 03/03/15 1614    PT LONG TERM GOAL #1   Title Patient will demonstrate the ability to correctly and consistently perform appropriate HEP in order to maintain functional gains and prevent recurrence of pain   Time 6   Period Weeks   Status On-going   PT LONG TERM GOAL #2   Title Patient will demonstrate the abilty to perform sitting tasks for at least 2 hours with pain no more than 2/10   Time 6   Period Weeks   Status On-going   PT LONG TERM GOAL #3   Title Patient will demonstrate the ability to remain in an upright weight bearing position for at least 3 hours and ambulate at least 90 minutes in order to increase functional task and work performance with pain no more than 2/10   Time 6   Period Weeks   Status On-going   PT LONG TERM GOAL #4   Title Patient will demonstrate the ability to perform full squat and lift at least 20 pounds in order to 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 Continues to state that her symptoms came on without injury or event and that her entire back was involved right away. Unable to perform SLS exercises such as standing hip ABD or extension due to increased pain radiating from back down legs. Very tight bilateral piriformis muscles noted. Lumbar flexion worsens pain, lumbar extension does not change pain/very mildly improves; patient instructed to trial lumbar extension in standing in tolerated range. Patient states that she was on lyrica , now on gabapentin for her pain. Do not recommend attempting spinal mobilizations at this time until imaging has been performed. Continues to state that pain is constantt and always around 9/10 however physical and verbal signs of distress do not match pain level.    Pt will benefit from skilled therapeutic intervention in order to improve on the following deficits Abnormal gait;Decreased endurance;Impaired perceived functional ability;Improper body mechanics;Decreased activity tolerance;Decreased strength;Impaired flexibility;Postural dysfunction;Decreased balance;Decreased mobility;Difficulty walking;Impaired sensation;Obesity;Decreased range of motion;Pain;Decreased coordination;Decreased safety awareness   Rehab Potential Fair   PT Frequency 3x / week   PT Duration 6 weeks   PT Treatment/Interventions ADLs/Self Care Home Management;Gait training;Neuromuscular re-education;Stair training;Functional mobility training;Patient/family education;Passive range of motion;Cryotherapy;Therapeutic activities;Electrical Stimulation;Therapeutic exercise;Manual techniques;Energy conservation;Moist Heat;DME Instruction;Balance training   PT Next Visit Plan Ask if pt has made MD appointment. Gentle mobility of hips/lumbar/thoracic spines; functional strengthening. Trial prone back extension if patient can  tolerate/able to perform.  PT Home Exercise Plan given   Consulted and Agree with Plan of Care Patient        Problem List Patient Active Problem List   Diagnosis Date Noted  . Palpitations 12/08/2014  . Chest pain 12/08/2014  . Obstructive sleep apnea 12/08/2014  . Incarcerated umbilical hernia 03/25/2014  . Cellulitis 02/19/2014  . Pelvic pain 07/15/2013  . Depression 07/15/2013  . Decreased libido 12/23/2012  . Dyspnea 11/12/2012  . Headache 10/14/2012  . Neuropathy 10/14/2012  . Candidal intertrigo 09/12/2012  . Diabetes mellitus type II, uncontrolled 05/10/2012  . Hypertension 05/10/2012  . HYPERLIPIDEMIA 09/29/2008  . OBESITY 08/31/2008  . NEPHROLITHIASIS 08/31/2008    Nedra Hai PT, DPT [phone]  [facility] [address] [place], [place], [address] Phone: [phone]   Fax:  [phone]

## 2015-03-08 NOTE — Therapy (Signed)
[facility] [address] [place], [place], [address] Phone: [phone]   Fax:  [phone]  Occupational Therapy Treatment  Patient Details  Name: [name] MRN: [id number] Date of Birth: Apr 12, 1966 Referring Provider:  Catalina Pizza, MD  Encounter Date: 03/08/2015      OT End of Session - 03/08/15 1522    Visit Number 5   Number of Visits 16   Date for OT Re-Evaluation 04/08/15  mini reassess 3/28   Authorization Type AETNA   Authorization Time Period  60 visits - will have PT as well   Authorization - Visit Number 5   Authorization - Number of Visits 60   OT Start Time 1436   OT Stop Time 1515   OT Time Calculation (min) 39 min   Activity Tolerance Patient tolerated treatment well;Patient limited by pain   Behavior During Therapy [facility] for tasks assessed/performed      Past Medical History  Diagnosis Date  . Diabetes mellitus     insulin pump 2013  . Neuropathy   . Hypertension   . Hypercholesteremia   . Shortness of breath   . Kidney stone   . Palpitations   . Obesity   . Obstructive sleep apnea     Past Surgical History  Procedure Laterality Date  . Cesarean section  1994;2000    [place]  . Cholecystectomy  1992  . Sinus sergery  1988    [place]  . Foot surgery Right March, 2013    [facility]-removal of bone spur  . Hysteroscopy with thermachoice  04/25/2012    Procedure: HYSTEROSCOPY WITH THERMACHOICE;  Surgeon: Lazaro Arms, MD;  Location: [facility];  Service: Gynecology;  Laterality: N/A;  total therapy time= 11 minutes 42 seconds; 34 ml D5w  in and 34 ml D5w out; temp =87 degrees F  . Cystoscopy w/ ureteral stent placement  05/08/2012    Procedure: CYSTOSCOPY WITH RETROGRADE PYELOGRAM/URETERAL STENT PLACEMENT;  Surgeon: Ky Barban, MD;  Location: [facility];  Service: Urology;  Laterality: Right;  . Umbilical hernia repair N/A 03/25/2014    Procedure: UMBILICAL HERNIA REPAIR;  Surgeon: Marlane Hatcher, MD;  Location: [facility];  Service: General;  Laterality: N/A;  . Uterine ablation    . Left heart catheterization with coronary angiogram N/A 12/11/2012    Procedure: LEFT HEART CATHETERIZATION WITH CORONARY ANGIOGRAM;  Surgeon: Runell Gess, MD;  Location: [facility] CATH LAB;  Service: Cardiovascular;  Laterality: N/A;  . Cardiac catheterization  12/11/2012    normal coronary arteries    There were no vitals filed for this visit.  Visit Diagnosis:  Pain in joint, shoulder region, left  Decreased range of motion (ROM) of shoulder  Cervical pain (neck)      Subjective Assessment - 03/08/15 1431    Symptoms S: I can't keep going on like this. I need to see the spine doctor.    Currently in Pain? Yes   Pain Score 9    Pain Location Neck  left shoulder   Pain Orientation Left   Pain Descriptors / Indicators Constant   Pain Type Acute pain           [facility] OT Assessment - 03/08/15 1441    Assessment   Diagnosis Left Shoulder and Cervical Pain   Precautions   Precautions None   AROM   Overall AROM Comments assessed in seated, ER/IR with shoulder adducted   AROM Assessment Site Shoulder   Right/Left  Shoulder Left   Left Shoulder Flexion 80 Degrees  previous 54   Left Shoulder ABduction 100 Degrees  previous 51   Left Shoulder Internal Rotation 57 Degrees  previous 55   Left Shoulder External Rotation 59 Degrees  previous 34                  OT Treatments/Exercises (OP) - 03/08/15 1502    Exercises   Exercises Shoulder;Neck   Shoulder Exercises: Supine   Protraction PROM;10 reps   Horizontal ABduction PROM;10 reps   External Rotation PROM;10 reps   Internal Rotation PROM;10 reps   Flexion PROM;10 reps   ABduction PROM;10 reps   Modalities   Modalities Electrical Stimulation;Moist Heat   Moist Heat Therapy   Number Minutes Moist Heat 15 Minutes   Moist Heat Location --  cervical region   Electrical Stimulation   Electrical Stimulation Location  cervical region   Electrical Stimulation Action Interferential   Electrical Stimulation Parameters 12.5 volts   Electrical Stimulation Goals Pain   Manual Therapy   Manual Therapy Myofascial release   Myofascial Release Myofascial release and manual stretching to left upper arm, scapular, shoulder, trapezius, and sternocleidomastoid region to decrease pain and restrictions and improve pain free moblity. Muscle energy technique to upper trapezius and anterior deltoid to relax muscle and decrease tone to increase joint mobility                  OT Short Term Goals - 02/18/15 1538    OT SHORT TERM GOAL #1   Title Patient will be educated on a HEP.   Time 4   Period Weeks   Status On-going   OT SHORT TERM GOAL #2   Title Patient will improve left shoulder PROM to [facility] for increased ease with donning and doffing clothing.   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    Time 4   Period Weeks   Status On-going   OT SHORT TERM GOAL #4   Title Patient will decrease left shoulder and neck pain to 7/10 or better when working.   Time 4   Period Weeks   Status On-going   OT SHORT TERM GOAL #5   Title Patient will decrease left shoulder and neck pain fascial restrictions to moderate.   Time 4   Period Weeks   Status On-going           OT Long Term Goals - 02/18/15 1539    OT LONG TERM GOAL #1   Title Patient will return to prior level of independence with all B/IADLs, work, and leisure activities.    Time 8   Period Weeks   Status On-going   OT LONG TERM GOAL #2   Title Patient will improve cervical and left shoulder AROM to [facility] for increased ablity to reach overhead and behind her back.    Time 8   Period Weeks   Status On-going   OT LONG TERM GOAL #3   Title Patient will improve left shoulder strength to 4-/5 for increased abilty to lift  bags of groceries.   Time 8   Period Weeks   Status On-going   OT LONG TERM GOAL #4   Title Patient will decrease pain in her left shoulder and neck region to 3/10 or better when completing daily  activities.    Time 8   Period Weeks   Status On-going   OT LONG TERM GOAL #5   Title Patient will decrease fascial restrictions to minimal in her left shoudler and neck region.    Time 8   Period Weeks   Status On-going               Plan - 03/08/15 1522    Clinical Impression Statement A: Mini-reassessment/shoulder AROM measurements this date. Pt has increased AROM measurements slightly. Pt reports severe pain in cervical region this date, 9/10. Pt had severe pain over weekend, unable to complete cervical AROM exercises and stretching. Pt reports episode of "paralysis" over weekend when standing over sink, lasting approximately 20 min. Pt has spoken to MD about pain, no appointment scheduled yet.    Plan P: Take cervical measurements for mini-reassessment. Resume PROM exercises and stretching. Follow up if made MD appt.         Problem List Patient Active Problem List   Diagnosis Date Noted  . Palpitations 12/08/2014  . Chest pain 12/08/2014  . Obstructive sleep apnea 12/08/2014  . Incarcerated umbilical hernia 03/25/2014  . Cellulitis 02/19/2014  . Pelvic pain 07/15/2013  . Depression 07/15/2013  . Decreased libido 12/23/2012  . Dyspnea 11/12/2012  . Headache 10/14/2012  . Neuropathy 10/14/2012  . Candidal intertrigo 09/12/2012  . Diabetes mellitus type II, uncontrolled 05/10/2012  . Hypertension 05/10/2012  . HYPERLIPIDEMIA 09/29/2008  . OBESITY 08/31/2008  . NEPHROLITHIASIS 08/31/2008    Ezra Sites, OTR/L  [phone]  03/08/2015, 3:29 PM  [facility] [address] [place], [place], [address] Phone: [phone]   Fax:  [phone]

## 2015-03-10 ENCOUNTER — Ambulatory Visit (HOSPITAL_COMMUNITY): Payer: Managed Care, Other (non HMO) | Admitting: Occupational Therapy

## 2015-03-10 ENCOUNTER — Ambulatory Visit (HOSPITAL_COMMUNITY): Payer: Managed Care, Other (non HMO) | Admitting: Physical Therapy

## 2015-03-10 ENCOUNTER — Telehealth (HOSPITAL_COMMUNITY): Payer: Self-pay

## 2015-03-10 NOTE — Telephone Encounter (Signed)
She is sick today °

## 2015-03-15 ENCOUNTER — Ambulatory Visit (HOSPITAL_COMMUNITY): Payer: Managed Care, Other (non HMO) | Attending: Internal Medicine | Admitting: Occupational Therapy

## 2015-03-15 DIAGNOSIS — M25512 Pain in left shoulder: Secondary | ICD-10-CM | POA: Insufficient documentation

## 2015-03-15 DIAGNOSIS — I10 Essential (primary) hypertension: Secondary | ICD-10-CM | POA: Insufficient documentation

## 2015-03-15 DIAGNOSIS — M25612 Stiffness of left shoulder, not elsewhere classified: Secondary | ICD-10-CM | POA: Insufficient documentation

## 2015-03-15 DIAGNOSIS — E119 Type 2 diabetes mellitus without complications: Secondary | ICD-10-CM | POA: Insufficient documentation

## 2015-03-15 DIAGNOSIS — Z9641 Presence of insulin pump (external) (internal): Secondary | ICD-10-CM | POA: Insufficient documentation

## 2015-03-15 DIAGNOSIS — Z794 Long term (current) use of insulin: Secondary | ICD-10-CM | POA: Insufficient documentation

## 2015-03-15 DIAGNOSIS — M542 Cervicalgia: Secondary | ICD-10-CM | POA: Insufficient documentation

## 2015-03-15 DIAGNOSIS — M6281 Muscle weakness (generalized): Secondary | ICD-10-CM | POA: Insufficient documentation

## 2015-03-18 ENCOUNTER — Telehealth (HOSPITAL_COMMUNITY): Payer: Self-pay | Admitting: Occupational Therapy

## 2015-03-18 ENCOUNTER — Ambulatory Visit (HOSPITAL_COMMUNITY): Payer: Managed Care, Other (non HMO) | Admitting: Occupational Therapy

## 2015-03-18 NOTE — Telephone Encounter (Signed)
She over slept today

## 2015-03-21 ENCOUNTER — Ambulatory Visit (HOSPITAL_COMMUNITY): Payer: Managed Care, Other (non HMO)

## 2015-03-22 ENCOUNTER — Ambulatory Visit (HOSPITAL_COMMUNITY): Payer: Managed Care, Other (non HMO) | Admitting: Physical Therapy

## 2015-03-24 ENCOUNTER — Ambulatory Visit (HOSPITAL_COMMUNITY): Payer: Managed Care, Other (non HMO) | Admitting: Occupational Therapy

## 2015-03-24 ENCOUNTER — Ambulatory Visit (HOSPITAL_COMMUNITY): Payer: Managed Care, Other (non HMO)

## 2015-03-28 ENCOUNTER — Encounter (HOSPITAL_COMMUNITY): Payer: Managed Care, Other (non HMO)

## 2015-03-31 ENCOUNTER — Encounter (HOSPITAL_COMMUNITY): Payer: Managed Care, Other (non HMO) | Admitting: Physical Therapy

## 2015-03-31 ENCOUNTER — Encounter (HOSPITAL_COMMUNITY): Payer: Managed Care, Other (non HMO)

## 2015-04-04 ENCOUNTER — Encounter (HOSPITAL_COMMUNITY): Payer: Managed Care, Other (non HMO)

## 2015-04-07 ENCOUNTER — Encounter (HOSPITAL_COMMUNITY): Payer: Managed Care, Other (non HMO) | Admitting: Physical Therapy

## 2015-04-07 ENCOUNTER — Encounter (HOSPITAL_COMMUNITY): Payer: Managed Care, Other (non HMO) | Admitting: Occupational Therapy

## 2015-04-11 ENCOUNTER — Encounter (HOSPITAL_COMMUNITY): Payer: Self-pay

## 2015-04-11 NOTE — Therapy (Signed)
[place], [place], [address] Phone: [phone]   Fax:  [phone]  Patient Details  Name: [name] MRN: [id number] Date of Birth: [birth year] Referring Provider:  No ref. provider found  Encounter Date: 04/11/2015 OCCUPATIONAL THERAPY DISCHARGE SUMMARY  Visits from Start of Care: 5  Current functional level related to goals / functional outcomes: OT LONG TERM GOAL #1    Title Patient will return to prior level of independence with all B/IADLs, work, and leisure activities.    Time 8   Period Weeks   Status On-going   OT LONG TERM GOAL #2   Title Patient will improve cervical and left shoulder AROM to [facility] for increased ablity to reach overhead and behind her back.    Time 8   Period Weeks   Status On-going   OT LONG TERM GOAL #3   Title Patient will improve left shoulder strength to 4-/5 for increased abilty to lift bags of groceries.   Time 8   Period Weeks   Status On-going   OT LONG TERM GOAL #4   Title Patient will decrease pain in her left shoulder and neck region to 3/10 or better when completing daily activities.    Time 8   Period Weeks   Status On-going   OT LONG TERM GOAL #5   Title Patient will decrease fascial restrictions to minimal in her left shoudler and neck region.    Time 8   Period Weeks   Status On-going          Remaining deficits: Pt has not returned to therapy since last occupational therapy appt on 03/08/15. Pt will be discharge from therapy due to 3 no shows.     Plan:                                                    Patient goals were not met. Patient is being discharged due to not returning since the last visit.  ?????          [name], OTR/L,CBIS  [phone]  04/11/2015, 11:41 AM  [place], [place],  [address] Phone: [phone]   Fax:  [phone]

## 2015-04-11 NOTE — Therapy (Signed)
[facility] [address] [place], [place], [address] Phone: [phone]   Fax:  [phone]  Patient Details  Name: [name] MRN: [id number] Date of Birth: 02/26/1966 Referring Provider:  Hall, Zach, MD  Encounter Date: 04/11/15  PHYSICAL THERAPY DISCHARGE SUMMARY  Visits from Start of Care: 4  Current functional level related to goals / functional outcomes:  At time of most recent treatment session patient continued to be limited by high levels of back pain and continued to have significant trouble with overall mobility; patient has not shown up for Physical Therapy appointments for 3 consecutive times and is now being discharged per policy.   Remaining deficits: Unable to assess as patient has not returned for skilled PT services since March 29th; however during last treatment session patient did continue to be highly limited by back pain.    Education / Equipment: HEP  Plan: Patient agrees to discharge.  Patient goals were not met. Patient is being discharged due to not returning since the last visit.  ?????         PT, DPT [phone]  [facility] [address] [place], [place], [address] Phone: [phone]   Fax:  [phone] 

## 2015-04-12 ENCOUNTER — Encounter (HOSPITAL_COMMUNITY): Payer: Managed Care, Other (non HMO) | Admitting: Physical Therapy

## 2015-04-12 ENCOUNTER — Encounter (HOSPITAL_COMMUNITY): Payer: Managed Care, Other (non HMO) | Admitting: Occupational Therapy

## 2015-04-14 ENCOUNTER — Encounter (HOSPITAL_COMMUNITY): Payer: Managed Care, Other (non HMO)

## 2015-04-14 ENCOUNTER — Encounter (HOSPITAL_COMMUNITY): Payer: Managed Care, Other (non HMO) | Admitting: Occupational Therapy

## 2015-04-19 ENCOUNTER — Encounter (HOSPITAL_COMMUNITY): Payer: Managed Care, Other (non HMO)

## 2015-04-19 ENCOUNTER — Encounter (HOSPITAL_COMMUNITY): Payer: Managed Care, Other (non HMO) | Admitting: Physical Therapy

## 2015-04-21 ENCOUNTER — Encounter (HOSPITAL_COMMUNITY): Payer: Managed Care, Other (non HMO) | Admitting: Occupational Therapy

## 2015-04-21 ENCOUNTER — Encounter (HOSPITAL_COMMUNITY): Payer: Managed Care, Other (non HMO) | Admitting: Physical Therapy

## 2015-05-17 ENCOUNTER — Encounter: Payer: Self-pay | Admitting: Orthopedic Surgery

## 2015-05-17 ENCOUNTER — Ambulatory Visit (INDEPENDENT_AMBULATORY_CARE_PROVIDER_SITE_OTHER): Payer: Managed Care, Other (non HMO)

## 2015-05-17 ENCOUNTER — Ambulatory Visit (INDEPENDENT_AMBULATORY_CARE_PROVIDER_SITE_OTHER): Payer: Managed Care, Other (non HMO) | Admitting: Orthopedic Surgery

## 2015-05-17 VITALS — BP 124/76 | Ht <= 58 in | Wt 229.0 lb

## 2015-05-17 DIAGNOSIS — M5442 Lumbago with sciatica, left side: Secondary | ICD-10-CM

## 2015-05-17 DIAGNOSIS — M545 Low back pain: Secondary | ICD-10-CM | POA: Diagnosis not present

## 2015-05-17 MED ORDER — INDOMETHACIN 25 MG PO CAPS: 25.0000 mg | ORAL_CAPSULE | Freq: Three times a day (TID) | ORAL | Status: DC

## 2015-05-17 MED ORDER — TRAMADOL-ACETAMINOPHEN 37.5-325 MG PO TABS: 1.0000 | ORAL_TABLET | ORAL | Status: DC | PRN

## 2015-05-17 MED ORDER — METHOCARBAMOL 500 MG PO TABS: 500.0000 mg | ORAL_TABLET | Freq: Four times a day (QID) | ORAL | Status: DC

## 2015-05-17 NOTE — Patient Instructions (Signed)
You have acute sciatica with back pain  I want you to be on absolute rest for the next 2 weeks  No lifting  You are to lie flat on your back with pillows under your legs  You are to use a heating pad 4 times a day for 30 minutes  You will take a muscle relaxer, and anti-inflammatory and a pain reliever as ordered  Return to the office in 2 weeks to reassess your situation

## 2015-05-17 NOTE — Progress Notes (Signed)
Patient ID: [name], female   DOB: 03/19/1966, 49 y.o.   MRN: [id number] Patient ID: [name], female   DOB: 1966/12/08, 49 y.o.   MRN: [id number]  Chief Complaint  Patient presents with  . Knee Pain    left knee pain x 1 month, no known injury    HPI [name] is a 49 y.o. female.  She comes in complaining of left knee pain and hip pain but she is actually complaining of back pain with radicular symptoms into her left foot for one month which is gotten worse. No trauma. She really complains of constant 10 out of 10 sharp throbbing burning stabbing aching radiating pain starting in the left side of her lower back radiating down into her left foot with the most intense pain in the left knee. This is associated with giving out symptoms catching locking and numbness and tingling. She took Advil did not improve she is on gabapentin for neuropathy and that doesn't help  She does complain of fatigue night sweats recent fever and chills vision problems shortness of breath frequent urination depression joint pain muscle weakness stiff joints back pain and the numbness tingling or weakness as described.  Allergic to  Review of Systems Review of Systems As above  Past Medical History  Diagnosis Date  . Diabetes mellitus     insulin pump 2013  . Neuropathy   . Hypertension   . Hypercholesteremia   . Shortness of breath   . Kidney stone   . Palpitations   . Obesity   . Obstructive sleep apnea     Past Surgical History  Procedure Laterality Date  . Cesarean section  1994;2000    [facility]  . Cholecystectomy  1992  . Sinus sergery  1988    [place]  . Foot surgery Right March, 2013    [facility]-removal of bone spur  . Hysteroscopy with thermachoice  04/25/2012    Procedure: HYSTEROSCOPY WITH THERMACHOICE;  Surgeon: Lazaro Arms, MD;  Location: [facility];  Service: Gynecology;  Laterality: N/A;  total therapy time= 11 minutes 42 seconds; 34 ml D5w  in and 34 ml D5w out; temp  =87 degrees F  . Cystoscopy w/ ureteral stent placement  05/08/2012    Procedure: CYSTOSCOPY WITH RETROGRADE PYELOGRAM/URETERAL STENT PLACEMENT;  Surgeon: Ky Barban, MD;  Location: [facility];  Service: Urology;  Laterality: Right;  . Umbilical hernia repair N/A 03/25/2014    Procedure: UMBILICAL HERNIA REPAIR;  Surgeon: Marlane Hatcher, MD;  Location: [facility];  Service: General;  Laterality: N/A;  . Uterine ablation    . Left heart catheterization with coronary angiogram N/A 12/11/2012    Procedure: LEFT HEART CATHETERIZATION WITH CORONARY ANGIOGRAM;  Surgeon: Runell Gess, MD;  Location: [facility] CATH LAB;  Service: Cardiovascular;  Laterality: N/A;  . Cardiac catheterization  12/11/2012    normal coronary arteries    Social History History  Substance Use Topics  . Smoking status: Never Smoker   . Smokeless tobacco: Never Used  . Alcohol Use: No    Allergies  Allergen Reactions  . Ciprofloxacin Hives and Swelling  . Penicillins Anaphylaxis and Rash  . Codeine Other (See Comments)    REACTION:  had hallicunations  . Morphine Nausea And Vomiting and Other (See Comments)    REACTION:   recieved in ED due to [facility] and had multple doses - gave visual hallucinations and vomitting.  . Sulfonamide Derivatives Other (See Comments)  REACTION: Unsure - childhood allergy  . Vancomycin Itching    Current Outpatient Prescriptions  Medication Sig Dispense Refill  . ALPRAZolam (XANAX) 0.5 MG tablet Take 1 tablet by mouth as needed.    . DULoxetine (CYMBALTA) 20 MG capsule Take 20 mg by mouth daily.    Marland Kitchen gabapentin (NEURONTIN) 300 MG capsule Take 300-600 mg by mouth 3 (three) times daily. Patient takes 1 capsule in the morning and 1 capsule in the afternoon and 2 capsules at night    . HUMALOG 100 UNIT/ML injection     . ondansetron (ZOFRAN) 4 MG tablet Take 1 tablet by mouth as needed.     No current facility-administered medications for this visit.      Physical Exam Physical  Exam Blood pressure 124/76, height 4\' 8"  (1.422 m), weight 229 lb (103.874 kg).  Gen. appearance obesity, grooming hygiene normal The patient is alert and oriented person place and time Mood is normal affect is normal Ambulatory status normal  Exam of the lumbar spine shows significant and severe tenderness increased lordosis, left buttock tenderness left posterior thigh tenderness  Positive straight leg raise on the left negative on the right decreased sensation L5 with pinwheel test normal reflexes 2+ equal bilaterally normal pulses in the dorsalis pedis area and they're equal in bilateral color and temperature of the foot is normal  Inspection the left knee reveals no swelling no tenderness range of motion is normal and full stability tests were normal provocative tests for meniscal pathology were negative strength was normal muscle tone normal    Data Reviewed X-ray of the lumbar spine. Looks like she has a L5-S1 spondylolysis and then she has degenerative disc changes at L4-5 with joint space narrowing  Assessment    She has back pain with sciatica    Plan    Recommend rest and symptomatic treatment. Out of work 2 weeks come back 2 weeks.       05/17/2015, 11:32 AM

## 2015-05-23 ENCOUNTER — Emergency Department (HOSPITAL_COMMUNITY)
Admission: EM | Admit: 2015-05-23 | Discharge: 2015-05-23 | Disposition: A | Discharge status: Home / Self Care | Payer: Managed Care, Other (non HMO) | Attending: Emergency Medicine | Admitting: Emergency Medicine

## 2015-05-23 ENCOUNTER — Encounter (HOSPITAL_COMMUNITY): Payer: Self-pay | Admitting: *Deleted

## 2015-05-23 ENCOUNTER — Emergency Department (HOSPITAL_COMMUNITY): Payer: Managed Care, Other (non HMO)

## 2015-05-23 DIAGNOSIS — Z880 Allergy status to penicillin: Secondary | ICD-10-CM | POA: Diagnosis not present

## 2015-05-23 DIAGNOSIS — E663 Overweight: Secondary | ICD-10-CM | POA: Diagnosis not present

## 2015-05-23 DIAGNOSIS — R0602 Shortness of breath: Secondary | ICD-10-CM | POA: Diagnosis not present

## 2015-05-23 DIAGNOSIS — Z87442 Personal history of urinary calculi: Secondary | ICD-10-CM | POA: Insufficient documentation

## 2015-05-23 DIAGNOSIS — Z791 Long term (current) use of non-steroidal anti-inflammatories (NSAID): Secondary | ICD-10-CM | POA: Diagnosis not present

## 2015-05-23 DIAGNOSIS — I10 Essential (primary) hypertension: Secondary | ICD-10-CM | POA: Insufficient documentation

## 2015-05-23 DIAGNOSIS — Z79899 Other long term (current) drug therapy: Secondary | ICD-10-CM | POA: Diagnosis not present

## 2015-05-23 DIAGNOSIS — E119 Type 2 diabetes mellitus without complications: Secondary | ICD-10-CM | POA: Insufficient documentation

## 2015-05-23 DIAGNOSIS — G629 Polyneuropathy, unspecified: Secondary | ICD-10-CM | POA: Insufficient documentation

## 2015-05-23 DIAGNOSIS — R079 Chest pain, unspecified: Secondary | ICD-10-CM | POA: Diagnosis not present

## 2015-05-23 LAB — CBC WITH DIFFERENTIAL/PLATELET
Basophils Absolute: 0 10*3/uL (ref 0.0–0.1)
Basophils Relative: 1 % (ref 0–1)
EOS PCT: 3 % (ref 0–5)
Eosinophils Absolute: 0.2 10*3/uL (ref 0.0–0.7)
HEMATOCRIT: 38.9 % (ref 36.0–46.0)
HEMOGLOBIN: 13.1 g/dL (ref 12.0–15.0)
LYMPHS PCT: 39 % (ref 12–46)
Lymphs Abs: 2.5 10*3/uL (ref 0.7–4.0)
MCH: 28.5 pg (ref 26.0–34.0)
MCHC: 33.7 g/dL (ref 30.0–36.0)
MCV: 84.7 fL (ref 78.0–100.0)
MONO ABS: 0.4 10*3/uL (ref 0.1–1.0)
MONOS PCT: 7 % (ref 3–12)
NEUTROS ABS: 3.2 10*3/uL (ref 1.7–7.7)
Neutrophils Relative %: 50 % (ref 43–77)
Platelets: 255 10*3/uL (ref 150–400)
RBC: 4.59 MIL/uL (ref 3.87–5.11)
RDW: 13.1 % (ref 11.5–15.5)
WBC: 6.4 10*3/uL (ref 4.0–10.5)

## 2015-05-23 LAB — COMPREHENSIVE METABOLIC PANEL
ALT: 25 U/L (ref 14–54)
ANION GAP: 8 (ref 5–15)
AST: 22 U/L (ref 15–41)
Albumin: 3.3 g/dL — ABNORMAL LOW (ref 3.5–5.0)
Alkaline Phosphatase: 125 U/L (ref 38–126)
BUN: 12 mg/dL (ref 6–20)
CO2: 28 mmol/L (ref 22–32)
Calcium: 8.2 mg/dL — ABNORMAL LOW (ref 8.9–10.3)
Chloride: 100 mmol/L — ABNORMAL LOW (ref 101–111)
Creatinine, Ser: 0.56 mg/dL (ref 0.44–1.00)
GLUCOSE: 217 mg/dL — AB (ref 65–99)
POTASSIUM: 3.6 mmol/L (ref 3.5–5.1)
Sodium: 136 mmol/L (ref 135–145)
Total Bilirubin: 0.7 mg/dL (ref 0.3–1.2)
Total Protein: 6.5 g/dL (ref 6.5–8.1)

## 2015-05-23 LAB — TROPONIN I: Troponin I: <0.03 ng/mL (ref <0.031)

## 2015-05-23 MED ORDER — FENTANYL CITRATE (PF) 100 MCG/2ML IJ SOLN
50.0000 ug | Freq: Once | INTRAMUSCULAR | Status: AC
  Administered 2015-05-23: 50 ug via INTRAVENOUS
  Filled 2015-05-23: qty 2

## 2015-05-23 MED ORDER — GI COCKTAIL ~~LOC~~
30.0000 mL | Freq: Once | ORAL | Status: AC
  Administered 2015-05-23: 30 mL via ORAL
  Filled 2015-05-23: qty 30

## 2015-05-23 MED ORDER — ONDANSETRON HCL 4 MG/2ML IJ SOLN: 4.0000 mg | Freq: Once | INTRAMUSCULAR | Status: AC

## 2015-05-23 MED ORDER — ONDANSETRON HCL 4 MG/2ML IJ SOLN
INTRAMUSCULAR | Status: AC
  Filled 2015-05-23: qty 2

## 2015-05-23 MED ORDER — ASPIRIN 325 MG PO TABS
325.0000 mg | ORAL_TABLET | Freq: Once | ORAL | Status: AC
  Administered 2015-05-23: 325 mg via ORAL
  Filled 2015-05-23: qty 1

## 2015-05-23 MED ORDER — NITROGLYCERIN 2 % TD OINT
1.0000 [in_us] | TOPICAL_OINTMENT | Freq: Four times a day (QID) | TRANSDERMAL | Status: DC
  Administered 2015-05-23: 1 [in_us] via TOPICAL
  Filled 2015-05-23: qty 1

## 2015-05-23 NOTE — ED Notes (Signed)
Pt states she was not feeling well when she went to bed last night. States she feels like someone is sitting on her chest.

## 2015-05-23 NOTE — Discharge Instructions (Signed)

## 2015-05-23 NOTE — ED Notes (Signed)
Pt c/o nausea.  

## 2015-05-23 NOTE — ED Provider Notes (Signed)
8:03 AM Patient is well-appearing.  Normal coronary arteries on heart catheterization within the past 2 years.  Her pain is very atypical.  Doubt cardiac disease.  Doubt PE.  Patient is on tramadol at home as prescribed 5 days ago for back and knee pain.  Initially she stated she had nothing more for pain at home except for ibuprofen and Tylenol.  Patient can take her tramadol at home as this is probably more of a musculoskeletal type issue.  Dg Lumbar Spine 2-3 Views  05/18/2015   Radiology report  Lumbar spine 2 views. Patient complains of back pain and left leg  radicular pain into the foot with numbness tingling.  AP view shows abnormal coronal plane contour with joint space narrowing at  L4-L5 and questionable spondylolysis at L5.   Lateral x-ray shows excessive lumbar lordosis spondylolysis at L5  posterior joint space narrowing at L4-L5  slight amount of slippage on L5-S1 on the spot film  Impression  Degenerative disc disease at L4-L5 Spondylolysis at L5-S1 Spondylolisthesis L5-S1  Dg Chest Portable 1 View  05/23/2015   CLINICAL DATA:  Chest pain and pressure since last night.  EXAM: PORTABLE CHEST - 1 VIEW  COMPARISON:  12/06/2014  FINDINGS: Shallow inspiration. The heart size and mediastinal contours are within normal limits. Both lungs are clear. The visualized skeletal structures are unremarkable.  IMPRESSION: No active disease.   Electronically Signed   By: Burman Nieves M.D.   On: 05/23/2015 06:47   I personally reviewed the imaging tests through PACS system I reviewed available ER/hospitalization records through the EMR  Results for orders placed or performed during the hospital encounter of 05/23/15  CBC with Differential  Result Value Ref Range   WBC 6.4 4.0 - 10.5 K/uL   RBC 4.59 3.87 - 5.11 MIL/uL   Hemoglobin 13.1 12.0 - 15.0 g/dL   HCT 32.4 40.1 - 02.7 %   MCV 84.7 78.0 - 100.0 fL   MCH 28.5 26.0 - 34.0 pg   MCHC 33.7 30.0 - 36.0 g/dL   RDW 25.3 66.4 - 40.3 %   Platelets  255 150 - 400 K/uL   Neutrophils Relative % 50 43 - 77 %   Neutro Abs 3.2 1.7 - 7.7 K/uL   Lymphocytes Relative 39 12 - 46 %   Lymphs Abs 2.5 0.7 - 4.0 K/uL   Monocytes Relative 7 3 - 12 %   Monocytes Absolute 0.4 0.1 - 1.0 K/uL   Eosinophils Relative 3 0 - 5 %   Eosinophils Absolute 0.2 0.0 - 0.7 K/uL   Basophils Relative 1 0 - 1 %   Basophils Absolute 0.0 0.0 - 0.1 K/uL  Comprehensive metabolic panel  Result Value Ref Range   Sodium 136 135 - 145 mmol/L   Potassium 3.6 3.5 - 5.1 mmol/L   Chloride 100 (L) 101 - 111 mmol/L   CO2 28 22 - 32 mmol/L   Glucose, Bld 217 (H) 65 - 99 mg/dL   BUN 12 6 - 20 mg/dL   Creatinine, Ser 4.74 0.44 - 1.00 mg/dL   Calcium 8.2 (L) 8.9 - 10.3 mg/dL   Total Protein 6.5 6.5 - 8.1 g/dL   Albumin 3.3 (L) 3.5 - 5.0 g/dL   AST 22 15 - 41 U/L   ALT 25 14 - 54 U/L   Alkaline Phosphatase 125 38 - 126 U/L   Total Bilirubin 0.7 0.3 - 1.2 mg/dL   GFR calc non Af Amer >60 >60 mL/min  GFR calc Af Amer >60 >60 mL/min   Anion gap 8 5 - 15  Troponin I  Result Value Ref Range   Troponin I <0.03 <0.031 ng/mL     Azalia Bilis, MD 05/23/15 [phone]

## 2015-05-23 NOTE — ED Provider Notes (Signed)
CSN: [id number]     Arrival date & time 05/23/15  0603 History   First MD Initiated Contact with Patient 05/23/15 0622     Chief Complaint  Patient presents with  . Chest Pain     (Consider location/radiation/quality/duration/timing/severity/associated sxs/prior Treatment) Patient is a 49 y.o. female presenting with chest pain.  Chest Pain Associated symptoms: shortness of breath   Associated symptoms: no abdominal pain, no cough, no fever, no headache, no nausea and not vomiting    This 49 year old female who presents with chest pain. History of diabetes, hypertension, hyperlipidemia. Reports that she wasn't feeling well when she went to bed last night. Woke up this morning with chest pain. She describes it as "someone sitting on her chest. She also endorses shortness of breath. She has had similar symptoms in the past. She is followed by cardiology Dr. Allyson Sabal. Currently her pain is 10 out of 10. She denies any fever or cough. Nothing makes it better or worse. She has not taken an aspirin today.  Denies any history of blood clots, recent travel, leg swelling.  Normal cardiac cath: 12/11/12 by Dr. Allyson Sabal.  Past Medical History  Diagnosis Date  . Diabetes mellitus     insulin pump 2013  . Neuropathy   . Hypertension   . Hypercholesteremia   . Shortness of breath   . Kidney stone   . Palpitations   . Obesity   . Obstructive sleep apnea    Past Surgical History  Procedure Laterality Date  . Cesarean section  1994;2000    [place]  . Cholecystectomy  1992  . Sinus sergery  1988    [place]  . Foot surgery Right March, 2013    [facility]-removal of bone spur  . Hysteroscopy with thermachoice  04/25/2012    Procedure: HYSTEROSCOPY WITH THERMACHOICE;  Surgeon: Lazaro Arms, MD;  Location: [facility];  Service: Gynecology;  Laterality: N/A;  total therapy time= 11 minutes 42 seconds; 34 ml D5w  in and 34 ml D5w out; temp =87 degrees F  . Cystoscopy w/ ureteral stent placement   05/08/2012    Procedure: CYSTOSCOPY WITH RETROGRADE PYELOGRAM/URETERAL STENT PLACEMENT;  Surgeon: Ky Barban, MD;  Location: [facility];  Service: Urology;  Laterality: Right;  . Umbilical hernia repair N/A 03/25/2014    Procedure: UMBILICAL HERNIA REPAIR;  Surgeon: Marlane Hatcher, MD;  Location: [facility];  Service: General;  Laterality: N/A;  . Uterine ablation    . Left heart catheterization with coronary angiogram N/A 12/11/2012    Procedure: LEFT HEART CATHETERIZATION WITH CORONARY ANGIOGRAM;  Surgeon: Runell Gess, MD;  Location: [facility] CATH LAB;  Service: Cardiovascular;  Laterality: N/A;  . Cardiac catheterization  12/11/2012    normal coronary arteries   Family History  Problem Relation Age of Onset  . Heart disease    . Cancer    . Diabetes    . Achalasia    . Anesthesia problems Neg Hx   . Hypotension Neg Hx   . Malignant hyperthermia Neg Hx   . Pseudochol deficiency Neg Hx   . Diabetes Mother   . Depression Paternal Grandmother   . Depression Paternal Grandfather    History  Substance Use Topics  . Smoking status: Never Smoker   . Smokeless tobacco: Never Used  . Alcohol Use: No   OB History    Gravida Para Term Preterm AB TAB SAB Ectopic Multiple Living   2 2 2        2  Review of Systems  Constitutional: Negative for fever.  Respiratory: Positive for shortness of breath. Negative for cough and chest tightness.   Cardiovascular: Positive for chest pain. Negative for leg swelling.  Gastrointestinal: Negative for nausea, vomiting and abdominal pain.  Genitourinary: Negative for dysuria.  Skin: Negative for rash.  Neurological: Negative for headaches.  All other systems reviewed and are negative.     Allergies  Ciprofloxacin; Penicillins; Codeine; Morphine; Sulfonamide derivatives; and Vancomycin  Home Medications   Prior to Admission medications   Medication Sig Start Date End Date Taking? Authorizing Provider  ALPRAZolam Prudy Feeler) 0.5 MG tablet Take 1  tablet by mouth as needed. 12/07/14  Yes Historical Provider, MD  DULoxetine (CYMBALTA) 20 MG capsule Take 20 mg by mouth daily.   Yes Historical Provider, MD  gabapentin (NEURONTIN) 300 MG capsule Take 300-600 mg by mouth 3 (three) times daily. Patient takes 1 capsule in the morning and 1 capsule in the afternoon and 2 capsules at night   Yes Historical Provider, MD  HUMALOG 100 UNIT/ML injection  01/06/15  Yes Historical Provider, MD  indomethacin (INDOCIN) 25 MG capsule Take 1 capsule (25 mg total) by mouth 3 (three) times daily with meals. 05/17/15  Yes Vickki Hearing, MD  methocarbamol (ROBAXIN) 500 MG tablet Take 1 tablet (500 mg total) by mouth 4 (four) times daily. 05/17/15  Yes Vickki Hearing, MD  ondansetron (ZOFRAN) 4 MG tablet Take 1 tablet by mouth as needed. 12/06/14  Yes Historical Provider, MD  traMADol-acetaminophen (ULTRACET) 37.5-325 MG per tablet Take 1 tablet by mouth every 4 (four) hours as needed. 05/17/15  Yes Vickki Hearing, MD   BP 146/83 mmHg  Pulse 75  Temp(Src) 98.6 F (37 C) (Oral)  Resp 12  Ht 4\' 8"  (1.422 m)  Wt 200 lb (90.719 kg)  BMI 44.86 kg/m2  SpO2 96% Physical Exam  Constitutional: She is oriented to person, place, and time. She appears well-developed and well-nourished.  Overweight  HENT:  Head: Normocephalic and atraumatic.  Cardiovascular: Normal rate, regular rhythm and normal heart sounds.   No murmur heard. Pulmonary/Chest: Effort normal. No respiratory distress. She has no wheezes. She exhibits no tenderness.  Abdominal: Soft. Bowel sounds are normal. There is no tenderness. There is no rebound.  Musculoskeletal: She exhibits no edema.  Neurological: She is alert and oriented to person, place, and time.  Skin: Skin is warm and dry.  Psychiatric: She has a normal mood and affect.  Nursing note and vitals reviewed.   ED Course  Procedures (including critical care time) Labs Review Labs Reviewed  CBC WITH DIFFERENTIAL/PLATELET   COMPREHENSIVE METABOLIC PANEL  TROPONIN I    Imaging Review Dg Chest Portable 1 View  05/23/2015   CLINICAL DATA:  Chest pain and pressure since last night.  EXAM: PORTABLE CHEST - 1 VIEW  COMPARISON:  12/06/2014  FINDINGS: Shallow inspiration. The heart size and mediastinal contours are within normal limits. Both lungs are clear. The visualized skeletal structures are unremarkable.  IMPRESSION: No active disease.   Electronically Signed   By: 12/08/2014 M.D.   On: 05/23/2015 06:47     EKG Interpretation   Date/Time:  Monday May 23 2015 06:20:59 EDT Ventricular Rate:  68 PR Interval:  145 QRS Duration: 82 QT Interval:  415 QTC Calculation: 441 R Axis:   59 Text Interpretation:  Sinus rhythm Confirmed by HORTON  MD, COURTNEY  ([id number]) on 05/23/2015 7:14:24 AM      MDM  Final diagnoses:  None    Patient presents with chest pain. Has had similar pain in the past. Followed by cardiology, Dr. Allyson Sabal. Cardiac cath in January 2014 was completely normal. She is PERC negative. X-ray and EKG are reassuring. Troponin pending.  Plan for 3 hour troponin. If negative can be discharged with cardiology follow-up.  Signed out to Dr. Patria Mane.  Shon Baton, MD 05/24/15 [phone]

## 2015-06-02 ENCOUNTER — Ambulatory Visit: Payer: Managed Care, Other (non HMO) | Admitting: Orthopedic Surgery

## 2015-06-09 ENCOUNTER — Encounter: Payer: Self-pay | Admitting: Orthopedic Surgery

## 2015-06-09 ENCOUNTER — Ambulatory Visit (INDEPENDENT_AMBULATORY_CARE_PROVIDER_SITE_OTHER): Payer: Managed Care, Other (non HMO) | Admitting: Orthopedic Surgery

## 2015-06-09 VITALS — Ht <= 58 in | Wt 200.0 lb

## 2015-06-09 DIAGNOSIS — M5126 Other intervertebral disc displacement, lumbar region: Secondary | ICD-10-CM | POA: Diagnosis not present

## 2015-06-09 MED ORDER — DIAZEPAM 5 MG PO TABS: ORAL_TABLET | ORAL | Status: DC

## 2015-06-09 NOTE — Patient Instructions (Addendum)
We will schedule MRI for you and call you with appt and results  Work note 2 weeks

## 2015-06-09 NOTE — Progress Notes (Signed)
Patient ID: RASHELLE IRELAND, female   DOB: Sep 17, 1966, 49 y.o.   MRN: [id number]   Chief complaint   HPI EARLYN SYLVAN is a 49 y.o. female.  She comes in complaining of left knee pain and hip pain but she is actually complaining of back pain with radicular symptoms into her left foot for one month which is gotten worse. No trauma. She really complains of constant 10 out of 10 sharp throbbing burning stabbing aching radiating pain starting in the left side of her lower back radiating down into her left foot with the most intense pain in the left knee. This is associated with giving out symptoms catching locking and numbness and tingling. She took Advil did not improve she is on gabapentin for neuropathy and that doesn't help  She does complain of fatigue night sweats recent fever and chills vision problems shortness of breath frequent urination depression joint pain muscle weakness stiff joints back pain and the numbness tingling or weakness as described.  I treated her with gabapentin and tramadol. She got no improvement. She went to the beach rested for 2 weeks and now the leg feels worse she says can you just cut it off.  Review of systems bowel and bladder function intact by history  MRI recommended to evaluate for herniated disc

## 2015-06-28 ENCOUNTER — Ambulatory Visit (HOSPITAL_COMMUNITY): Payer: Managed Care, Other (non HMO)

## 2015-07-25 ENCOUNTER — Other Ambulatory Visit: Payer: Self-pay | Admitting: Orthopedic Surgery

## 2015-09-21 ENCOUNTER — Ambulatory Visit (HOSPITAL_COMMUNITY): Payer: Managed Care, Other (non HMO) | Attending: Orthopedic Surgery | Admitting: Physical Therapy

## 2015-09-21 DIAGNOSIS — R531 Weakness: Secondary | ICD-10-CM | POA: Insufficient documentation

## 2015-09-21 DIAGNOSIS — M25662 Stiffness of left knee, not elsewhere classified: Secondary | ICD-10-CM

## 2015-09-21 DIAGNOSIS — M25561 Pain in right knee: Secondary | ICD-10-CM | POA: Diagnosis present

## 2015-09-21 DIAGNOSIS — R29898 Other symptoms and signs involving the musculoskeletal system: Secondary | ICD-10-CM | POA: Diagnosis present

## 2015-09-21 DIAGNOSIS — R269 Unspecified abnormalities of gait and mobility: Secondary | ICD-10-CM

## 2015-09-21 DIAGNOSIS — M25562 Pain in left knee: Secondary | ICD-10-CM | POA: Diagnosis present

## 2015-09-21 DIAGNOSIS — M5489 Other dorsalgia: Secondary | ICD-10-CM | POA: Insufficient documentation

## 2015-09-21 DIAGNOSIS — R262 Difficulty in walking, not elsewhere classified: Secondary | ICD-10-CM

## 2015-09-21 DIAGNOSIS — R6889 Other general symptoms and signs: Secondary | ICD-10-CM

## 2015-09-21 NOTE — Patient Instructions (Signed)
Quad Set    Slowly tighten muscles on thigh of straight leg while counting out loud to _3___.  Repeat __10__ times. Do _2___ sessions per day.  http://gt2.exer.us/362   Copyright  VHI. All rights reserved.  Heel Slide    Bend knee and pull heel toward buttocks. Return.  Repeat __15__ times. Do __2__ sessions per day.  http://gt2.exer.us/372   Copyright  VHI. All rights reserved.  Short Arc [employer] a large can or rolled towel under leg. Straighten knee and leg. Hold __3__ seconds. Repeat __10__ times. Do __2__ sessions per day.  http://gt2.exer.us/366   Copyright  VHI. All rights reserved.

## 2015-09-21 NOTE — Therapy (Signed)
[facility] [address] [place], [place], [address] Phone: [phone]   Fax:  [phone]  Physical Therapy Evaluation  Patient Details  Name: [name] MRN: [id number] Date of Birth: 10-06-1966 Referring Provider:  Rinaldo Ratel, MD  Encounter Date: 09/21/2015      PT End of Session - 09/21/15 1637    Visit Number 1   Number of Visits 16   Date for PT Re-Evaluation 10/21/15   Authorization Type [employer]   Authorization Time Period 09/21/15-11/18/15   PT Start Time 1355   PT Stop Time 1430   PT Time Calculation (min) 35 min   Activity Tolerance Patient tolerated treatment well   Behavior During Therapy [facility] for tasks assessed/performed      Past Medical History  Diagnosis Date  . Diabetes mellitus     insulin pump 2013  . Neuropathy   . Hypertension   . Hypercholesteremia   . Shortness of breath   . Kidney stone   . Palpitations   . Obesity   . Obstructive sleep apnea     Past Surgical History  Procedure Laterality Date  . Cesarean section  1994;2000    [place]  . Cholecystectomy  1992  . Sinus sergery  1988    [place]  . Foot surgery Right March, 2013    [facility]-removal of bone spur  . Hysteroscopy with thermachoice  04/25/2012    Procedure: HYSTEROSCOPY WITH THERMACHOICE;  Surgeon: Lazaro Arms, MD;  Location: [facility];  Service: Gynecology;  Laterality: N/A;  total therapy time= 11 minutes 42 seconds; 34 ml D5w  in and 34 ml D5w out; temp =87 degrees F  . Cystoscopy w/ ureteral stent placement  05/08/2012    Procedure: CYSTOSCOPY WITH RETROGRADE PYELOGRAM/URETERAL STENT PLACEMENT;  Surgeon: Ky Barban, MD;  Location: [facility];  Service: Urology;  Laterality: Right;  . Umbilical hernia repair N/A 03/25/2014    Procedure: UMBILICAL HERNIA REPAIR;  Surgeon: Marlane Hatcher, MD;  Location: [facility];  Service: General;  Laterality: N/A;  . Uterine ablation    . Left heart catheterization with  coronary angiogram N/A 12/11/2012    Procedure: LEFT HEART CATHETERIZATION WITH CORONARY ANGIOGRAM;  Surgeon: Runell Gess, MD;  Location: [facility] CATH LAB;  Service: Cardiovascular;  Laterality: N/A;  . Cardiac catheterization  12/11/2012    normal coronary arteries    There were no vitals filed for this visit.  Visit Diagnosis:  Difficulty walking  Left knee pain  Stiffness of left knee  Decreased functional activity tolerance  Abnormality of gait      Subjective Assessment - 09/21/15 1358    Subjective Pt reports that she had arthroscopic surgery on her L meniscus on 9/1. Since the surgery, she has been having a lot of pain and swelling, She has been having difficulty with stairs, walking, and with standing after sitting down for a long time.    How long can you sit comfortably? 20 minutes   How long can you stand comfortably? 5 minutes   How long can you walk comfortably? 5 minutes   Currently in Pain? Yes   Pain Score 10-Worst pain ever   Pain Location Knee   Pain Orientation Left            [facility] PT Assessment - 09/21/15 0001    Assessment   Medical Diagnosis L knee arthroscopy   Onset Date/Surgical Date 08/11/15   Next MD Visit 10/24/15   Precautions  Precautions None   Restrictions   Weight Bearing Restrictions No   Balance Screen   Has the patient fallen in the past 6 months Yes   How many times? 6   Has the patient had a decrease in activity level because of a fear of falling?  Yes   Is the patient reluctant to leave their home because of a fear of falling?  No   Prior Function   Level of Independence Independent   Vocation Full time employment   Vocation Requirements CNA   Leisure reading, crossword puzzles   ROM / Strength   AROM / PROM / Strength AROM;Strength   AROM   AROM Assessment Site Knee;Hip   Right Hip External Rotation  25   Right Hip Internal Rotation  37   Left Hip External Rotation  20   Left Hip Internal Rotation  23   Right/Left  Knee Right;Left   Left Knee Extension -15   Left Knee Flexion 70   Strength   Right Hip Flexion 4/5   Right Hip Extension 2+/5   Right Hip ABduction 4/5   Left Hip Flexion 3+/5   Left Hip Extension 2+/5   Left Hip ABduction 3/5   Right Knee Flexion 4/5   Right Knee Extension 4+/5   Left Knee Flexion 3/5   Left Knee Extension 3/5   Transfers   Five time sit to stand comments  35.18"   Ambulation/Gait   Gait Pattern Decreased stance time - left;Decreased step length - right;Decreased hip/knee flexion - left   Gait Comments TUG: 27.90                 PT Education - 09/21/15 1637    Education provided Yes   Education Details HEP, prognosis   Person(s) Educated Patient   Methods Explanation;Handout   Comprehension Verbalized understanding          PT Short Term Goals - 09/21/15 1641    PT SHORT TERM GOAL #1   Title Pt will be independent with HEP.    Time 3   Period Weeks   Status New   PT SHORT TERM GOAL #2   Title Improve knee ROM to 8-90 degrees to improve ability to descend stairs.    Time 3   Period Weeks   Status New   PT SHORT TERM GOAL #3   Title Improve LLE strength to 4-/5 or greater to improve gait mechanics.    Time 3   Period Weeks   Status New   PT SHORT TERM GOAL #4   Title Pt will complete five time sit to stand in 25 seconds or less to demonstrate improved BLE power.    Time 3   Period Weeks   Status New   PT SHORT TERM GOAL #5   Title Pt will complete TUG in 20 seconds or less to demonstrate decreased fall risk.            PT Long Term Goals - 09/21/15 1642    PT LONG TERM GOAL #1   Title Improve knee ROM to 0-120 to improve gait mechanics and ability to navigate stairs.    Time 6   Period Weeks   Status New   PT LONG TERM GOAL #2   Title Improve LLE strength to 4+/5 grossly to improve gait mechanics and functional mobility.   Time 6   Period Weeks   Status New   PT LONG TERM GOAL #3   Title Pt  will complete five time sit  to stand in 15 seconds or less to demonstrate improved power of BLE.    Time 6   Period Weeks   Status New   PT LONG TERM GOAL #4   Title Pt will complete TUG in 14 seconds or less to demonstrate decreased fall risk.    Time 6   Period Weeks   Status New   PT LONG TERM GOAL #5   Title Pt will ambulate 1,000 feet with equal step length, proper knee flexion, and <3/10 pain.    Time 6   Period Weeks   Status New               Plan - 09/21/15 1638    Clinical Impression Statement Pt present to PT following athroscopic surgery to L knee with the following impairments:  decreased ROM of L knee, decreased strength of LLE, decreased gait speed, fall risk, impaired functional mobility, and impaired gait mechanics. 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    Pt will benefit from skilled therapeutic intervention in order to improve on the following deficits Abnormal gait;Decreased activity tolerance;Decreased balance;Decreased endurance;Decreased range of motion;Decreased strength;Difficulty walking;Hypomobility;Increased edema;Pain   Rehab Potential Good   PT Frequency 3x / week   PT Duration 6 weeks   PT Treatment/Interventions ADLs/Self Care Home Management;Cryotherapy;Gait training;Stair training;Functional mobility training;Therapeutic activities;Therapeutic exercise;Balance training;Neuromuscular re-education;Patient/family education;Manual techniques         Problem List Patient Active Problem List   Diagnosis Date Noted  . Palpitations 12/08/2014  . Chest pain 12/08/2014  . Obstructive sleep apnea 12/08/2014  . Incarcerated umbilical hernia 03/25/2014  . Cellulitis 02/19/2014  . Pelvic pain 07/15/2013  . Depression 07/15/2013  . Decreased libido 12/23/2012  . Dyspnea 11/12/2012  . Headache(784.0) 10/14/2012  . Neuropathy (HCC) 10/14/2012  . Candidal  intertrigo 09/12/2012  . Diabetes mellitus type II, uncontrolled (HCC) 05/10/2012  . Hypertension 05/10/2012  . HYPERLIPIDEMIA 09/29/2008  . OBESITY 08/31/2008  . NEPHROLITHIASIS 08/31/2008    Leona Singleton, PT, DPT [phone] 09/21/2015, 4:47 PM  [facility] [address] [place], [place], [address] Phone: [phone]   Fax:  [phone]

## 2015-09-26 ENCOUNTER — Ambulatory Visit (HOSPITAL_COMMUNITY): Payer: Managed Care, Other (non HMO) | Admitting: Physical Therapy

## 2015-09-26 DIAGNOSIS — R6889 Other general symptoms and signs: Secondary | ICD-10-CM

## 2015-09-26 DIAGNOSIS — R262 Difficulty in walking, not elsewhere classified: Secondary | ICD-10-CM

## 2015-09-26 DIAGNOSIS — M25562 Pain in left knee: Secondary | ICD-10-CM

## 2015-09-26 DIAGNOSIS — R269 Unspecified abnormalities of gait and mobility: Secondary | ICD-10-CM

## 2015-09-26 DIAGNOSIS — M25662 Stiffness of left knee, not elsewhere classified: Secondary | ICD-10-CM

## 2015-09-26 NOTE — Therapy (Signed)
[facility] [address] [place], [place], [address] Phone: [phone]   Fax:  [phone]  Physical Therapy Treatment  Patient Details  Name: [name] MRN: [id number] Date of Birth: [birth year] No Data Recorded  Encounter Date: 09/26/2015      PT End of Session - 09/26/15 1613    Visit Number 2   Number of Visits 16   Date for PT Re-Evaluation 10/21/15   Authorization Type Aetna   Authorization Time Period 09/21/15-11/18/15   PT Start Time 1515   PT Stop Time 1559   PT Time Calculation (min) 44 min   Activity Tolerance Patient tolerated treatment well   Behavior During Therapy [facility] for tasks assessed/performed      Past Medical History  Diagnosis Date  . Diabetes mellitus     insulin pump 2013  . Neuropathy   . Hypertension   . Hypercholesteremia   . Shortness of breath   . Kidney stone   . Palpitations   . Obesity   . Obstructive sleep apnea     Past Surgical History  Procedure Laterality Date  . Cesarean section  1994;2000    [place]  . Cholecystectomy  1992  . Sinus sergery  1988    [place]  . Foot surgery Right March, 2013    [facility]-removal of bone spur  . Hysteroscopy with thermachoice  04/25/2012    Procedure: HYSTEROSCOPY WITH THERMACHOICE;  Surgeon: Lazaro Arms, MD;  Location: [facility];  Service: Gynecology;  Laterality: N/A;  total therapy time= 11 minutes 42 seconds; 34 ml D5w  in and 34 ml D5w out; temp =87 degrees F  . Cystoscopy w/ ureteral stent placement  05/08/2012    Procedure: CYSTOSCOPY WITH RETROGRADE PYELOGRAM/URETERAL STENT PLACEMENT;  Surgeon: Ky Barban, MD;  Location: [facility];  Service: Urology;  Laterality: Right;  . Umbilical hernia repair N/A 03/25/2014    Procedure: UMBILICAL HERNIA REPAIR;  Surgeon: Marlane Hatcher, MD;  Location: [facility];  Service: General;  Laterality: N/A;  . Uterine ablation    . Left heart catheterization with coronary angiogram N/A 12/11/2012     Procedure: LEFT HEART CATHETERIZATION WITH CORONARY ANGIOGRAM;  Surgeon: Runell Gess, MD;  Location: [facility] CATH LAB;  Service: Cardiovascular;  Laterality: N/A;  . Cardiac catheterization  12/11/2012    normal coronary arteries    There were no vitals filed for this visit.  Visit Diagnosis:  Difficulty walking  Left knee pain  Stiffness of left knee  Decreased functional activity tolerance  Abnormality of gait      Subjective Assessment - 09/26/15 1521    Subjective Pt reports that she is frustrated with not being able to walk as far as would like to without pain. She tried to go walking through [employer] over the weekend, and she was unable to walk through the store due to pain.    Currently in Pain? Yes   Pain Score 8    Pain Location Knee   Pain Orientation Left                         [facility] Adult PT Treatment/Exercise - 09/26/15 0001    Exercises   Exercises Knee/Hip   Knee/Hip Exercises: Stretches   Active Hamstring Stretch 3 reps;30 seconds   Active Hamstring Stretch Limitations 12" step   Gastroc Stretch 3 reps;30 seconds   Gastroc Stretch Limitations slantboard   Knee/Hip Exercises: Aerobic  Stationary Bike 5' seat 6 at end of session   Knee/Hip Exercises: Standing   Heel Raises 15 reps   Functional Squat 10 reps   Functional Squat Limitations mini squat at mat table   Rocker Board 2 minutes   Rocker Board Limitations R/L and A/P   Knee/Hip Exercises: Supine   Quad Sets 15 reps   Quad Sets Limitations 3" hold   Short Arc Quad Sets 15 reps   Heel Slides 15 reps   Straight Leg Raises 10 reps   Knee/Hip Exercises: Sidelying   Hip ABduction 10 reps                PT Education - 09/26/15 1611    Education provided Yes   Education Details Goals and HEP reviewed   Person(s) Educated Patient   Methods Explanation;Handout   Comprehension Verbalized understanding;Returned demonstration          PT Short Term Goals - 09/21/15  1641    PT SHORT TERM GOAL #1   Title Pt will be independent with HEP.    Time 3   Period Weeks   Status New   PT SHORT TERM GOAL #2   Title Improve knee ROM to 8-90 degrees to improve ability to descend stairs.    Time 3   Period Weeks   Status New   PT SHORT TERM GOAL #3   Title Improve LLE strength to 4-/5 or greater to improve gait mechanics.    Time 3   Period Weeks   Status New   PT SHORT TERM GOAL #4   Title Pt will complete five time sit to stand in 25 seconds or less to demonstrate improved BLE power.    Time 3   Period Weeks   Status New   PT SHORT TERM GOAL #5   Title Pt will complete TUG in 20 seconds or less to demonstrate decreased fall risk.            PT Long Term Goals - 09/21/15 1642    PT LONG TERM GOAL #1   Title Improve knee ROM to 0-120 to improve gait mechanics and ability to navigate stairs.    Time 6   Period Weeks   Status New   PT LONG TERM GOAL #2   Title Improve LLE strength to 4+/5 grossly to improve gait mechanics and functional mobility.   Time 6   Period Weeks   Status New   PT LONG TERM GOAL #3   Title Pt will complete five time sit to stand in 15 seconds or less to demonstrate improved power of BLE.    Time 6   Period Weeks   Status New   PT LONG TERM GOAL #4   Title Pt will complete TUG in 14 seconds or less to demonstrate decreased fall risk.    Time 6   Period Weeks   Status New   PT LONG TERM GOAL #5   Title Pt will ambulate 1,000 feet with equal step length, proper knee flexion, and <3/10 pain.    Time 6   Period Weeks   Status New               Plan - 09/26/15 1613    Clinical Impression Statement Goals and HEP reviewed today. Treatment session focused on functional strengthening and improving ROM. Pt had difficulty completing heel slides due to pain in knee, was encouraged to continue with the activity at home as well and to  follow up with ice. Pt completed supine and standing strengthening with good form  following verbal cueing, required 2 rest breaks during treatment due to fatigue.    PT Next Visit Plan Continue with LE strengthening, add TKE in standing and lunges        Problem List Patient Active Problem List   Diagnosis Date Noted  . Palpitations 12/08/2014  . Chest pain 12/08/2014  . Obstructive sleep apnea 12/08/2014  . Incarcerated umbilical hernia 03/25/2014  . Cellulitis 02/19/2014  . Pelvic pain 07/15/2013  . Depression 07/15/2013  . Decreased libido 12/23/2012  . Dyspnea 11/12/2012  . Headache(784.0) 10/14/2012  . Neuropathy (HCC) 10/14/2012  . Candidal intertrigo 09/12/2012  . Diabetes mellitus type II, uncontrolled (HCC) 05/10/2012  . Hypertension 05/10/2012  . HYPERLIPIDEMIA 09/29/2008  . OBESITY 08/31/2008  . NEPHROLITHIASIS 08/31/2008    Leona Singleton, PT, DPT [phone] 09/26/2015, 4:16 PM  [place] [facility] [address] [place], [place], [address] Phone: [phone]   Fax:  [phone]  Name: LAVAUN WOLFENDEN MRN: [id number] Date of Birth: 1966/05/13

## 2015-09-28 ENCOUNTER — Ambulatory Visit (HOSPITAL_COMMUNITY): Payer: Managed Care, Other (non HMO) | Admitting: Physical Therapy

## 2015-09-30 ENCOUNTER — Ambulatory Visit (HOSPITAL_COMMUNITY): Payer: Managed Care, Other (non HMO)

## 2015-10-03 ENCOUNTER — Ambulatory Visit (HOSPITAL_COMMUNITY): Payer: Managed Care, Other (non HMO) | Admitting: Physical Therapy

## 2015-10-05 ENCOUNTER — Ambulatory Visit (HOSPITAL_COMMUNITY): Payer: Managed Care, Other (non HMO) | Admitting: Physical Therapy

## 2015-10-05 DIAGNOSIS — R262 Difficulty in walking, not elsewhere classified: Secondary | ICD-10-CM

## 2015-10-05 DIAGNOSIS — R531 Weakness: Secondary | ICD-10-CM

## 2015-10-05 DIAGNOSIS — R29898 Other symptoms and signs involving the musculoskeletal system: Secondary | ICD-10-CM

## 2015-10-05 DIAGNOSIS — M5489 Other dorsalgia: Secondary | ICD-10-CM

## 2015-10-05 DIAGNOSIS — M25662 Stiffness of left knee, not elsewhere classified: Secondary | ICD-10-CM

## 2015-10-05 DIAGNOSIS — R6889 Other general symptoms and signs: Secondary | ICD-10-CM

## 2015-10-05 DIAGNOSIS — M25561 Pain in right knee: Secondary | ICD-10-CM

## 2015-10-05 DIAGNOSIS — M25562 Pain in left knee: Secondary | ICD-10-CM

## 2015-10-05 NOTE — Therapy (Signed)
[place] [facility] [address] [place], [place], [address] Phone: [phone]   Fax:  [phone]  Physical Therapy Treatment  Patient Details  Name: [name] MRN: [id number] Date of Birth: June 11, 1966 No Data Recorded  Encounter Date: 10/05/2015      PT End of Session - 10/05/15 1344    Visit Number 3   Number of Visits 16   Date for PT Re-Evaluation 10/21/15   Authorization Type [employer]etna   Authorization Time Period 09/21/15-11/18/15   PT Start Time 1300   PT Stop Time 1348   PT Time Calculation (min) 48 min   Activity Tolerance Patient tolerated treatment well   Behavior During Therapy [facility] for tasks assessed/performed      Past Medical History  Diagnosis Date  . Diabetes mellitus     insulin pump 2013  . Neuropathy   . Hypertension   . Hypercholesteremia   . Shortness of breath   . Kidney stone   . Palpitations   . Obesity   . Obstructive sleep apnea     Past Surgical History  Procedure Laterality Date  . Cesarean section  1994;2000    [place]  . Cholecystectomy  1992  . Sinus sergery  1988    [place]  . Foot surgery Right March, 2013    [facility]-removal of bone spur  . Hysteroscopy with thermachoice  04/25/2012    Procedure: HYSTEROSCOPY WITH THERMACHOICE;  Surgeon: Lazaro Arms, MD;  Location: [facility];  Service: Gynecology;  Laterality: N/A;  total therapy time= 11 minutes 42 seconds; 34 ml D5w  in and 34 ml D5w out; temp =87 degrees F  . Cystoscopy w/ ureteral stent placement  05/08/2012    Procedure: CYSTOSCOPY WITH RETROGRADE PYELOGRAM/URETERAL STENT PLACEMENT;  Surgeon: Ky Barban, MD;  Location: [facility];  Service: Urology;  Laterality: Right;  . Umbilical hernia repair N/A 03/25/2014    Procedure: UMBILICAL HERNIA REPAIR;  Surgeon: Marlane Hatcher, MD;  Location: [facility];  Service: General;  Laterality: N/A;  . Uterine ablation    . Left heart catheterization with coronary angiogram N/A 12/11/2012     Procedure: LEFT HEART CATHETERIZATION WITH CORONARY ANGIOGRAM;  Surgeon: Runell Gess, MD;  Location[facility] CATH LAB;  Service: Cardiovascular;  Laterality: N/A;  . Cardiac catheterization  12/11/2012    normal coronary arteries    There were no vitals filed for this visit.  Visit Diagnosis:  Left knee pain  Stiffness of left knee  Decreased functional activity tolerance  Back pain without sciatica  Knee pain, right  Right leg weakness  Generalized weakness  Difficulty walking      Subjective Assessment - 10/05/15 1411    Subjective Pt states she is doing well today and not having any pain, just a little discomfort.   Currently in Pain? No/denies                         [facility] Adult PT Treatment/Exercise - 10/05/15 0001    Knee/Hip Exercises: Stretches   Active Hamstring Stretch 3 reps;30 seconds   Active Hamstring Stretch Limitations 12" step   Gastroc Stretch 3 reps;30 seconds   Gastroc Stretch Limitations slantboard   Knee/Hip Exercises: Aerobic   Stationary Bike 5' seat 6 at end of session   Knee/Hip Exercises: Standing   Heel Raises 15 reps   Knee Flexion Left;10 reps   Forward Lunges Both;10 reps   Forward Lunges Limitations  4" box   Side Lunges Both;10 reps   Side Lunges Limitations 4" box   Lateral Step Up Left;10 reps;Step Height: 4";Hand Hold: 1   Forward Step Up Left;10 reps;Step Height: 4";Hand Hold: 1   Step Down Left;10 reps;Step Height: 4";Hand Hold: 1   Functional Squat 15 reps   Rocker Board 2 minutes   Rocker Board Limitations R/L and A/P                  PT Short Term Goals - 09/21/15 1641    PT SHORT TERM GOAL #1   Title Pt will be independent with HEP.    Time 3   Period Weeks   Status New   PT SHORT TERM GOAL #2   Title Improve knee ROM to 8-90 degrees to improve ability to descend stairs.    Time 3   Period Weeks   Status New   PT SHORT TERM GOAL #3   Title Improve LLE strength to 4-/5 or greater to  improve gait mechanics.    Time 3   Period Weeks   Status New   PT SHORT TERM GOAL #4   Title Pt will complete five time sit to stand in 25 seconds or less to demonstrate improved BLE power.    Time 3   Period Weeks   Status New   PT SHORT TERM GOAL #5   Title Pt will complete TUG in 20 seconds or less to demonstrate decreased fall risk.            PT Long Term Goals - 09/21/15 1642    PT LONG TERM GOAL #1   Title Improve knee ROM to 0-120 to improve gait mechanics and ability to navigate stairs.    Time 6   Period Weeks   Status New   PT LONG TERM GOAL #2   Title Improve LLE strength to 4+/5 grossly to improve gait mechanics and functional mobility.   Time 6   Period Weeks   Status New   PT LONG TERM GOAL #3   Title Pt will complete five time sit to stand in 15 seconds or less to demonstrate improved power of BLE.    Time 6   Period Weeks   Status New   PT LONG TERM GOAL #4   Title Pt will complete TUG in 14 seconds or less to demonstrate decreased fall risk.    Time 6   Period Weeks   Status New   PT LONG TERM GOAL #5   Title Pt will ambulate 1,000 feet with equal step length, proper knee flexion, and <3/10 pain.    Time 6   Period Weeks   Status New               Plan - 10/05/15 1355    Clinical Impression Statement Continued focus on improving functional strength and ROM of Lt LE.  Pt completing mat exercises as HEP now so focused on standing exercises.  Progressed with forward and lateral lunges with cues for form and step ups/downs using 4" step.  Pt able to complete all without increased complaints of pain.    PT Next Visit Plan Continue to progress functional strength of Lt LE.         Problem List Patient Active Problem List   Diagnosis Date Noted  . Palpitations 12/08/2014  . Chest pain 12/08/2014  . Obstructive sleep apnea 12/08/2014  . Incarcerated umbilical hernia 03/25/2014  . Cellulitis 02/19/2014  .  Pelvic pain 07/15/2013  .  Depression 07/15/2013  . Decreased libido 12/23/2012  . Dyspnea 11/12/2012  . Headache(784.0) 10/14/2012  . Neuropathy (HCC) 10/14/2012  . Candidal intertrigo 09/12/2012  . Diabetes mellitus type II, uncontrolled (HCC) 05/10/2012  . Hypertension 05/10/2012  . HYPERLIPIDEMIA 09/29/2008  . OBESITY 08/31/2008  . NEPHROLITHIASIS 08/31/2008    Lurena Nida, PTA/CLT [phone] 10/05/2015, 2:12 PM  [facility] [address] [place], [place], [address] Phone: [phone]   Fax:  [phone]  Name: [name] MRN: [id number] Date of Birth: [birth year]

## 2015-10-07 ENCOUNTER — Ambulatory Visit (HOSPITAL_COMMUNITY): Payer: Managed Care, Other (non HMO) | Admitting: Physical Therapy

## 2015-10-10 ENCOUNTER — Ambulatory Visit (HOSPITAL_COMMUNITY): Payer: Managed Care, Other (non HMO) | Admitting: Physical Therapy

## 2015-10-12 ENCOUNTER — Ambulatory Visit (HOSPITAL_COMMUNITY): Payer: Managed Care, Other (non HMO) | Admitting: Physical Therapy

## 2015-10-14 ENCOUNTER — Ambulatory Visit (HOSPITAL_COMMUNITY): Payer: Managed Care, Other (non HMO) | Attending: Orthopedic Surgery

## 2015-10-14 ENCOUNTER — Telehealth (HOSPITAL_COMMUNITY): Payer: Self-pay

## 2015-10-14 NOTE — Telephone Encounter (Signed)
No show, called and left message about missed apt. and contact info given.  [address], [place]PT[place]; CBIS [phone]

## 2015-10-17 ENCOUNTER — Encounter (HOSPITAL_COMMUNITY): Payer: Managed Care, Other (non HMO) | Admitting: Physical Therapy

## 2015-10-19 ENCOUNTER — Encounter (HOSPITAL_COMMUNITY): Payer: Managed Care, Other (non HMO) | Admitting: Physical Therapy

## 2015-10-24 ENCOUNTER — Encounter (HOSPITAL_COMMUNITY): Payer: Managed Care, Other (non HMO) | Admitting: Physical Therapy

## 2015-10-26 ENCOUNTER — Encounter (HOSPITAL_COMMUNITY): Payer: Managed Care, Other (non HMO) | Admitting: Physical Therapy

## 2015-10-31 ENCOUNTER — Encounter (HOSPITAL_COMMUNITY): Payer: Managed Care, Other (non HMO) | Admitting: Physical Therapy

## 2015-11-02 ENCOUNTER — Encounter (HOSPITAL_COMMUNITY): Payer: Managed Care, Other (non HMO) | Admitting: Physical Therapy

## 2015-11-20 ENCOUNTER — Other Ambulatory Visit: Payer: Self-pay | Admitting: Orthopedic Surgery

## 2016-01-30 ENCOUNTER — Other Ambulatory Visit (HOSPITAL_COMMUNITY): Payer: Self-pay | Admitting: Internal Medicine

## 2016-01-30 DIAGNOSIS — Z1231 Encounter for screening mammogram for malignant neoplasm of breast: Secondary | ICD-10-CM

## 2016-02-13 ENCOUNTER — Ambulatory Visit (HOSPITAL_COMMUNITY): Payer: Managed Care, Other (non HMO)

## 2016-02-15 ENCOUNTER — Ambulatory Visit (HOSPITAL_COMMUNITY)
Admission: RE | Admit: 2016-02-15 | Discharge: 2016-02-15 | Disposition: A | Discharge status: Home / Self Care | Payer: Managed Care, Other (non HMO) | Source: Ambulatory Visit | Attending: Internal Medicine | Admitting: Internal Medicine

## 2016-02-15 DIAGNOSIS — Z1231 Encounter for screening mammogram for malignant neoplasm of breast: Secondary | ICD-10-CM | POA: Insufficient documentation

## 2016-04-16 ENCOUNTER — Ambulatory Visit: Payer: Managed Care, Other (non HMO) | Admitting: Orthopedic Surgery

## 2016-04-16 ENCOUNTER — Encounter: Payer: Self-pay | Admitting: Orthopedic Surgery

## 2016-11-07 ENCOUNTER — Encounter (INDEPENDENT_AMBULATORY_CARE_PROVIDER_SITE_OTHER): Payer: Self-pay | Admitting: *Deleted

## 2017-02-07 ENCOUNTER — Other Ambulatory Visit (HOSPITAL_COMMUNITY): Payer: Self-pay | Admitting: Internal Medicine

## 2017-02-07 DIAGNOSIS — Z1231 Encounter for screening mammogram for malignant neoplasm of breast: Secondary | ICD-10-CM

## 2017-02-08 ENCOUNTER — Ambulatory Visit (HOSPITAL_COMMUNITY)
Admission: RE | Admit: 2017-02-08 | Discharge: 2017-02-08 | Disposition: A | Discharge status: Home / Self Care | Payer: Managed Care, Other (non HMO) | Source: Ambulatory Visit | Attending: Internal Medicine | Admitting: Internal Medicine

## 2017-02-08 DIAGNOSIS — Z1231 Encounter for screening mammogram for malignant neoplasm of breast: Secondary | ICD-10-CM

## 2018-03-12 ENCOUNTER — Encounter: Payer: Self-pay | Admitting: Orthopedic Surgery

## 2018-03-12 ENCOUNTER — Ambulatory Visit (INDEPENDENT_AMBULATORY_CARE_PROVIDER_SITE_OTHER): Payer: Self-pay | Admitting: Orthopedic Surgery

## 2018-03-12 ENCOUNTER — Ambulatory Visit (INDEPENDENT_AMBULATORY_CARE_PROVIDER_SITE_OTHER): Payer: Self-pay

## 2018-03-12 VITALS — BP 146/95 | HR 85 | Ht <= 58 in | Wt 205.0 lb

## 2018-03-12 DIAGNOSIS — M25562 Pain in left knee: Secondary | ICD-10-CM

## 2018-03-12 DIAGNOSIS — M1712 Unilateral primary osteoarthritis, left knee: Secondary | ICD-10-CM

## 2018-03-12 DIAGNOSIS — G8929 Other chronic pain: Secondary | ICD-10-CM

## 2018-03-12 MED ORDER — METHYLPREDNISOLONE ACETATE 40 MG/ML IJ SUSP
40.0000 mg | Freq: Once | INTRAMUSCULAR | Status: AC
  Administered 2018-03-12: 40 mg via INTRAMUSCULAR

## 2018-03-12 NOTE — Progress Notes (Signed)
Progress Note for new problem:   Patient ID: [name], female   DOB: 01-28-1966, 52 y.o.   MRN: [id number]  Chief Complaint  Patient presents with  . Knee Pain    left     52 year old female had a knee arthroscopy with medial meniscectomy notes indicate 3 compartment arthrosis  Presents with 4-week history of pain swelling loss of motion left knee, dull throbbing pain in the front of the knee radiates down to the foot  Treated with ice no improvement she is having a lot of trouble ambulating    Review of Systems  Constitutional: Negative.   Skin: Negative.   Neurological: Negative.    Current Meds  Medication Sig  . ALPRAZolam (XANAX) 1 MG tablet TK 1/2 TO 1 T PO QD  . gabapentin (NEURONTIN) 300 MG capsule Take 300-600 mg by mouth 3 (three) times daily. Patient takes 1 capsule in the morning and 1 capsule in the afternoon and 2 capsules at night  . HUMALOG 100 UNIT/ML injection   . LORazepam (ATIVAN) 0.5 MG tablet TK 1 T PO ONCE D PRA  . ranitidine (ZANTAC) 150 MG tablet Take 150 mg by mouth 2 (two) times daily.    Past Medical History:  Diagnosis Date  . Diabetes mellitus    insulin pump 2013  . Hypercholesteremia   . Hypertension   . Kidney stone   . Neuropathy   . Obesity   . Obstructive sleep apnea   . Palpitations   . Shortness of breath      Allergies  Allergen Reactions  . Ciprofloxacin Hives and Swelling  . Penicillins Anaphylaxis and Rash  . Codeine Other (See Comments)    REACTION:  had hallicunations  . Morphine Nausea And Vomiting and Other (See Comments)    REACTION:   recieved in ED due to [facility] and had multple doses - gave visual hallucinations and vomitting.  . Sulfonamide Derivatives Other (See Comments)    REACTION: Unsure - childhood allergy  . Vancomycin Itching    BP (!) 146/95   Pulse 85   Ht 4\' 8"  (1.422 m)   Wt 205 lb (93 kg)   BMI 45.96 kg/m    Physical Exam  Constitutional: She is oriented to person, place, and  time. She appears well-developed and well-nourished.  Neurological: She is alert and oriented to person, place, and time.  Psychiatric: She has a normal mood and affect. Judgment normal.  Vitals reviewed.   Ortho Exam  Probable effusion left knee limb alignment normal only flexes to 90 degrees has a slight flexion contracture ligaments are stable medial condyle and joint line are tender no atrophy skin without rash distal pulses intact no edema no sensory loss in the left leg straight leg raise negative  Right knee appears to have flexion contracture as well may be 5 degrees but motion is normal limb alignment is normal  Patient's gait is abnormal noticeable limp  Medical decision-making  Imaging: X-ray shows moderate to severe arthritis of the medial compartment with normal limb alignment   Encounter Diagnoses  Name Primary?  . Chronic pain of left knee Yes  . Primary osteoarthritis of left knee     Difficult to relate say what is going on here.  Seems to have acute effusion with underlying osteoarthritis  Patient is diabetic  Has several allergies as well.  Recommend IM shot of Depo-Medrol Requisite ice elevation decreased activity and rest  Recheck on April 15th  April 17, MD  03/12/2018 5:06 PM

## 2018-03-12 NOTE — Patient Instructions (Signed)
OOW THRU 4/15

## 2018-03-12 NOTE — Addendum Note (Signed)
Addended byCaffie Damme on: 03/12/2018 05:14 PM   Modules accepted: Orders

## 2018-03-26 ENCOUNTER — Ambulatory Visit: Payer: Self-pay | Admitting: Orthopedic Surgery

## 2018-04-09 ENCOUNTER — Other Ambulatory Visit (HOSPITAL_COMMUNITY): Payer: Self-pay | Admitting: Adult Health Nurse Practitioner

## 2018-04-09 DIAGNOSIS — I70213 Atherosclerosis of native arteries of extremities with intermittent claudication, bilateral legs: Secondary | ICD-10-CM

## 2018-04-10 ENCOUNTER — Ambulatory Visit (HOSPITAL_COMMUNITY)
Admission: RE | Admit: 2018-04-10 | Discharge: 2018-04-10 | Disposition: A | Discharge status: Home / Self Care | Payer: Managed Care, Other (non HMO) | Source: Ambulatory Visit | Attending: Adult Health Nurse Practitioner | Admitting: Adult Health Nurse Practitioner

## 2018-04-10 ENCOUNTER — Encounter: Payer: Self-pay | Admitting: Gastroenterology

## 2018-04-10 ENCOUNTER — Other Ambulatory Visit (HOSPITAL_COMMUNITY): Payer: Self-pay | Admitting: Unknown Physician Specialty

## 2018-04-10 DIAGNOSIS — I70213 Atherosclerosis of native arteries of extremities with intermittent claudication, bilateral legs: Secondary | ICD-10-CM | POA: Diagnosis present

## 2018-04-10 DIAGNOSIS — Z1231 Encounter for screening mammogram for malignant neoplasm of breast: Secondary | ICD-10-CM

## 2018-04-11 ENCOUNTER — Ambulatory Visit (HOSPITAL_COMMUNITY): Payer: Self-pay

## 2018-04-16 ENCOUNTER — Ambulatory Visit: Payer: Self-pay | Admitting: Orthopedic Surgery

## 2018-04-16 ENCOUNTER — Encounter: Payer: Self-pay | Admitting: Orthopedic Surgery

## 2018-04-16 ENCOUNTER — Ambulatory Visit (HOSPITAL_COMMUNITY)
Admission: RE | Admit: 2018-04-16 | Discharge: 2018-04-16 | Disposition: A | Discharge status: Home / Self Care | Payer: Managed Care, Other (non HMO) | Source: Ambulatory Visit | Attending: Unknown Physician Specialty | Admitting: Unknown Physician Specialty

## 2018-04-16 ENCOUNTER — Encounter (HOSPITAL_COMMUNITY): Payer: Self-pay

## 2018-04-16 DIAGNOSIS — Z1231 Encounter for screening mammogram for malignant neoplasm of breast: Secondary | ICD-10-CM | POA: Diagnosis present

## 2018-05-30 ENCOUNTER — Encounter (INDEPENDENT_AMBULATORY_CARE_PROVIDER_SITE_OTHER): Payer: Managed Care, Other (non HMO) | Admitting: Neurology

## 2018-05-30 ENCOUNTER — Ambulatory Visit (INDEPENDENT_AMBULATORY_CARE_PROVIDER_SITE_OTHER): Payer: Managed Care, Other (non HMO) | Admitting: Neurology

## 2018-05-30 DIAGNOSIS — M79605 Pain in left leg: Secondary | ICD-10-CM | POA: Diagnosis not present

## 2018-05-30 DIAGNOSIS — M79604 Pain in right leg: Secondary | ICD-10-CM | POA: Diagnosis not present

## 2018-05-30 DIAGNOSIS — Z0289 Encounter for other administrative examinations: Secondary | ICD-10-CM

## 2018-05-30 NOTE — Procedures (Signed)
Full Name: [name] Gender: Female MRN #: [id number] Date of Birth: July 15, 1966    Visit Date: 05/30/2018 08:15 Age: 52 Years 9 Months Old Examining Physician: Levert Feinstein, MD  Referring Physician: Elder Negus DNP History: 52 years old female, presented with a year history of bilateral lower extremity getting worse since May 2019, she complains constant bilateral anterior thigh pain, no weakness, no sensory loss.  Summary of the tests:  Nerve conduction study: Bilateral sural, superficial peroneal sensory responses were normal, bilateral tibial, peroneal to EDB motor responses were normal.  Electromyography: Selective needle examination of bilateral lower extremity muscles and bilateral lumbosacral paraspinal muscles were normal.  Conclusion: This is a normal study, there is no electrodiagnostic evidence of inflammatory myopathy, lower extremity neuropathy or bilateral lumbosacral radiculopathy.    ------------------------------- Levert Feinstein, M.D.  [facility] [address] [place], [place] [address] Tel: [phone] Fax: [phone]        [facility]    Nerve / Sites Muscle Latency Ref. Amplitude Ref. Rel Amp Segments Distance Velocity Ref. Area    ms ms mV mV %  cm m/s m/s mVms  L Peroneal - EDB     Ankle EDB 5.2 ?6.5 3.5 ?2.0 100 Ankle - EDB 9   9.8     Fib head EDB 11.8  2.9  83.7 Fib head - Ankle 29 44 ?44 8.6     Pop fossa EDB 14.0  3.0  104 Pop fossa - Fib head 10 45 ?44 9.4         Pop fossa - Ankle      R Peroneal - EDB     Ankle EDB 5.1 ?6.5 4.3 ?2.0 100 Ankle - EDB 9   13.5     Fib head EDB 11.6  4.0  92.7 Fib head - Ankle 29 44 ?44 14.4     Pop fossa EDB 13.9  3.0  76.5 Pop fossa - Fib head 10 44 ?44 10.0         Pop fossa - Ankle      L Tibial - AH     Ankle AH 5.6 ?5.8 6.0 ?4.0 100 Ankle - AH 9   13.7     Pop fossa AH 13.1  3.2  52.4 Pop fossa - Ankle 33 44 ?41 11.1  R Tibial - AH     Ankle AH 4.5 ?5.8 5.3 ?4.0 100 Ankle - AH 9    15.0     Pop fossa AH 12.2  4.6  87.9 Pop fossa - Ankle 33 43 ?41 13.5             SNC    Nerve / Sites Rec. Site Peak Lat Ref.  Amp Ref. Segments Distance    ms ms V V  cm  L Sural - Ankle (Calf)     Calf Ankle 4.0 ?4.4 7 ?6 Calf - Ankle 14  R Sural - Ankle (Calf)     Calf Ankle 3.9 ?4.4 7 ?6 Calf - Ankle 14  L Superficial peroneal - Ankle     Lat leg Ankle 4.4 ?4.4 6 ?6 Lat leg - Ankle 14  R Superficial peroneal - Ankle     Lat leg Ankle 4.0 ?4.4 6 ?6 Lat leg - Ankle 14              F  Wave    Nerve F Lat Ref.   ms ms  L Tibial - AH  52.1 ?56.0  R Tibial - AH 51.5 ?56.0         EMG full       EMG Summary Table    Spontaneous MUAP Recruitment  Muscle IA Fib PSW Fasc Other Amp Dur. Poly Pattern  R. Tibialis anterior Normal None None None _______ Normal Normal Normal Normal  R. Tibialis posterior Normal None None None _______ Normal Normal Normal Normal  R. Peroneus longus Normal None None None _______ Normal Normal Normal Normal  R. Gastrocnemius (Medial head) Normal None None None _______ Normal Normal Normal Normal  R. Vastus lateralis Normal None None None _______ Normal Normal Normal Normal  L. Tibialis posterior Normal None None None _______ Normal Normal Normal Normal  L. Tibialis anterior Normal None None None _______ Normal Normal Normal Normal  L. Peroneus longus Normal None None None _______ Normal Normal Normal Normal  L. Gastrocnemius (Medial head) Normal None None None _______ Normal Normal Normal Normal  L. Vastus lateralis Normal None None None _______ Normal Normal Normal Normal  L. Biceps femoris (short head) Normal None None None _______ Normal Normal Normal Normal  R. Biceps femoris (long head) Normal None None None _______ Normal Normal Normal Normal  L. Lumbar paraspinals (mid) Normal None None None _______ Normal Normal Normal Normal  L. Lumbar paraspinals (low) Normal None None None _______ Normal Normal Normal Normal  R. Lumbar paraspinals (mid)  Normal None None None _______ Normal Normal Normal Normal  R. Lumbar paraspinals (low) Normal None None None _______ Normal Normal Normal Normal

## 2018-07-10 ENCOUNTER — Ambulatory Visit: Payer: Managed Care, Other (non HMO) | Admitting: Nurse Practitioner

## 2018-08-06 ENCOUNTER — Ambulatory Visit: Payer: Managed Care, Other (non HMO) | Admitting: Nurse Practitioner

## 2018-08-14 ENCOUNTER — Encounter (HOSPITAL_COMMUNITY): Payer: Self-pay | Admitting: Physical Therapy

## 2018-08-14 NOTE — Therapy (Signed)
[place], [place], [address] Phone: [phone]   Fax:  [phone]  Patient Details  Name: GRISELA MESCH MRN: [id number] Date of Birth: Apr 09, 1966 Referring Provider:  No ref. provider found   PHYSICAL THERAPY DISCHARGE SUMMARY  Visits from Start of Care: 3  Current functional level related to goals / functional outcomes: Not met as pt stopped coming to therapy   Remaining deficits: unknown   Education / Equipment: HEP Plan: Patient agrees to discharge.  Patient goals were not met. Patient is being discharged due to not returning since the last visit.  ?????     Encounter Date: 08/14/2018 Rayetta Humphrey, PT CLT [phone] 08/14/2018, 4:08 PM  [place] [address] [place], [place], [address] Phone: [phone]   Fax:  [phone]

## 2018-10-16 ENCOUNTER — Encounter (HOSPITAL_COMMUNITY): Payer: Self-pay | Admitting: Occupational Therapy

## 2019-01-19 ENCOUNTER — Ambulatory Visit (INDEPENDENT_AMBULATORY_CARE_PROVIDER_SITE_OTHER): Payer: 59 | Admitting: Orthopedic Surgery

## 2019-01-19 ENCOUNTER — Encounter: Payer: Self-pay | Admitting: Orthopedic Surgery

## 2019-01-19 VITALS — BP 142/80 | HR 87 | Ht <= 58 in | Wt 247.0 lb

## 2019-01-19 DIAGNOSIS — Z6841 Body Mass Index (BMI) 40.0 and over, adult: Secondary | ICD-10-CM

## 2019-01-19 DIAGNOSIS — M170 Bilateral primary osteoarthritis of knee: Secondary | ICD-10-CM | POA: Diagnosis not present

## 2019-01-19 NOTE — Progress Notes (Signed)
PATIENT OFFICE VISIT  Chief Complaint  Patient presents with  . Knee Pain    bilateral     53 year old female with BMI of 55 presents with recurrent and chronic bilateral knee pain right greater than left.  She reports no history of recent trauma.  She continues to have dull aching nonradiating bilateral knee pain primarily on the medial compartment with no mechanical symptoms.  She has had pain now over a year she is actually gained weight.  She says she does not eat that much but seems to continue with weight gain wondering if she needs metabolic work-up.   Review of Systems  Constitutional: Negative for fever.  Respiratory: Positive for shortness of breath.   Cardiovascular: Negative for chest pain.  Musculoskeletal: Positive for back pain and joint pain.  Skin: Negative.   Neurological: Negative for tingling.     Past Medical History:  Diagnosis Date  . Diabetes mellitus    insulin pump 2013  . Hypercholesteremia   . Hypertension   . Kidney stone   . Neuropathy   . Obesity   . Obstructive sleep apnea   . Palpitations   . Shortness of breath     Past Surgical History:  Procedure Laterality Date  . CARDIAC CATHETERIZATION  12/11/2012   normal coronary arteries  . CESAREAN SECTION  1994;2000   [facility]  . CHOLECYSTECTOMY  1992  . CYSTOSCOPY W/ URETERAL STENT PLACEMENT  05/08/2012   Procedure: CYSTOSCOPY WITH RETROGRADE PYELOGRAM/URETERAL STENT PLACEMENT;  Surgeon: Ky BarbanMohammad I Javaid, MD;  Location: [facility];  Service: Urology;  Laterality: Right;  . FOOT SURGERY Right March, 2013   [facility]-removal of bone spur  . HYSTEROSCOPY WITH THERMACHOICE  04/25/2012   Procedure: HYSTEROSCOPY WITH THERMACHOICE;  Surgeon: Lazaro ArmsLuther H Eure, MD;  Location: [facility];  Service: Gynecology;  Laterality: N/A;  total therapy time= 11 minutes 42 seconds; 34 ml D5w  in and 34 ml D5w out; temp =87 degrees F  . LEFT HEART CATHETERIZATION WITH CORONARY ANGIOGRAM N/A 12/11/2012   Procedure:  LEFT HEART CATHETERIZATION WITH CORONARY ANGIOGRAM;  Surgeon: Runell GessJonathan J Berry, MD;  [facility];  Service: Cardiovascular;  Laterality: N/A;  . Sinus sergery  1988   [place]  . UMBILICAL HERNIA REPAIR N/A 03/25/2014   Procedure: UMBILICAL HERNIA REPAIR;  Surgeon: Marlane HatcherWilliam S Bradford, MD;  Location: [facility];  Service: General;  Laterality: N/A;  . uterine ablation      Family History  Problem Relation Age of Onset  . Diabetes Mother   . Depression Paternal Grandmother   . Depression Paternal Grandfather   . Heart disease Unknown   . Cancer Unknown   . Diabetes Unknown   . Achalasia Unknown   . Anesthesia problems Neg Hx   . Hypotension Neg Hx   . Malignant hyperthermia Neg Hx   . Pseudochol deficiency Neg Hx    Social History   Tobacco Use  . Smoking status: Never Smoker  . Smokeless tobacco: Never Used  Substance Use Topics  . Alcohol use: No  . Drug use: No    Allergies  Allergen Reactions  . Ciprofloxacin Hives and Swelling  . Penicillins Anaphylaxis and Rash  . Codeine Other (See Comments)    REACTION:  had hallicunations  . Morphine Nausea And Vomiting and Other (See Comments)    REACTION:   [facility] and had multple doses - gave visual hallucinations and vomitting.  . Sulfonamide Derivatives Other (See Comments)  REACTION: Unsure - childhood allergy  . Vancomycin Itching    Current Meds  Medication Sig  . ALPRAZolam (XANAX) 1 MG tablet TK 1/2 TO 1 T PO QD  . buPROPion (WELLBUTRIN XL) 150 MG 24 hr tablet TK 1 T PO D  . DULoxetine (CYMBALTA) 20 MG capsule Take 20 mg by mouth daily.  . DULoxetine (CYMBALTA) 60 MG capsule TK 1 C PO D  . escitalopram (LEXAPRO) 10 MG tablet TK 1 T PO Q NIGHT  . gabapentin (NEURONTIN) 300 MG capsule Take 300-600 mg by mouth 3 (three) times daily. Patient takes 1 capsule in the morning and 1 capsule in the afternoon and 2 capsules at night  . HUMALOG 100 UNIT/ML injection   . LORazepam (ATIVAN)  0.5 MG tablet TK 1 T PO ONCE D PRA  . ranitidine (ZANTAC) 150 MG tablet Take 150 mg by mouth 2 (two) times daily.    BP (!) 142/80   Pulse 87   Ht 4\' 8"  (1.422 m)   Wt 247 lb (112 kg)   BMI 55.38 kg/m   Physical Exam Vitals signs reviewed.  Constitutional:      General: She is not in acute distress.    Appearance: Normal appearance. She is well-developed. She is obese. She is not ill-appearing, toxic-appearing or diaphoretic.  Musculoskeletal:     Right knee: She exhibits no effusion.     Left knee: She exhibits no effusion.  Neurological:     Mental Status: She is alert and oriented to person, place, and time.     Gait: Gait abnormal.     Comments: Waddling gait stride length is short cadence is diminished  Psychiatric:        Attention and Perception: Attention normal.        Mood and Affect: Mood and affect normal.        Speech: Speech normal.        Behavior: Behavior normal.        Thought Content: Thought content normal.        Judgment: Judgment normal.     Right Knee Exam   Muscle Strength  The patient has normal right knee strength.  Tenderness  The patient is experiencing tenderness in the medial joint line.  Range of Motion  Extension:  5 normal  Flexion:  120 normal   Tests  McMurray:  Medial - negative Lateral - negative Varus: negative Valgus: negative Drawer:  Anterior - negative    Posterior - negative  Other  Erythema: absent Scars: absent Sensation: normal Pulse: present Swelling: none Effusion: no effusion present   Left Knee Exam   Muscle Strength  The patient has normal left knee strength.  Tenderness  The patient is experiencing tenderness in the medial joint line.  Range of Motion  Extension:  5 normal  Flexion:  120 normal   Tests  McMurray:  Medial - negative Lateral - negative Varus: negative Valgus: negative Drawer:  Anterior - negative     Posterior - negative  Other  Erythema: absent Scars: absent Sensation:  normal Pulse: present Swelling: none Effusion: no effusion present     Painless range of motion both hips   MEDICAL DECISION SECTION  Xrays were done at n/a  My independent reading of xrays:  n/a  No diagnosis found.  PLAN: (Rx., injectx, surgery, frx, mri/ct) The patient meets the AMA guidelines for Morbid (severe) obesity with a BMI > 40.0 and I have recommended weight loss. Weight loss  Procedure note left knee injection verbal consent was obtained to inject left knee joint  Timeout was completed to confirm the site of injection  The medications used were 40 mg of Depo-Medrol and 1% lidocaine 3 cc  Anesthesia was provided by ethyl chloride and the skin was prepped with alcohol.  After cleaning the skin with alcohol a 20-gauge needle was used to inject the left knee joint. There were no complications. A sterile bandage was applied.   Procedure note right knee injection verbal consent was obtained to inject right knee joint  Timeout was completed to confirm the site of injection  The medications used were 40 mg of Depo-Medrol and 1% lidocaine 3 cc  Anesthesia was provided by ethyl chloride and the skin was prepped with alcohol.  After cleaning the skin with alcohol a 20-gauge needle was used to inject the right knee joint. There were no complications. A sterile bandage was applied.    No orders of the defined types were placed in this encounter.   Fuller Canada, MD  01/19/2019 4:06 PM

## 2019-03-04 ENCOUNTER — Ambulatory Visit: Payer: Managed Care, Other (non HMO) | Admitting: Nurse Practitioner

## 2019-03-06 ENCOUNTER — Ambulatory Visit: Payer: Managed Care, Other (non HMO) | Admitting: Gastroenterology

## 2019-03-18 ENCOUNTER — Other Ambulatory Visit: Payer: Self-pay

## 2019-03-18 ENCOUNTER — Encounter (HOSPITAL_COMMUNITY): Payer: Self-pay | Admitting: Emergency Medicine

## 2019-03-18 ENCOUNTER — Emergency Department (HOSPITAL_COMMUNITY)
Admission: EM | Admit: 2019-03-18 | Discharge: 2019-03-18 | Disposition: A | Discharge status: Home / Self Care | Payer: 59 | Attending: Emergency Medicine | Admitting: Emergency Medicine

## 2019-03-18 DIAGNOSIS — I10 Essential (primary) hypertension: Secondary | ICD-10-CM | POA: Insufficient documentation

## 2019-03-18 DIAGNOSIS — Z79899 Other long term (current) drug therapy: Secondary | ICD-10-CM | POA: Insufficient documentation

## 2019-03-18 DIAGNOSIS — M25551 Pain in right hip: Secondary | ICD-10-CM | POA: Insufficient documentation

## 2019-03-18 DIAGNOSIS — Z794 Long term (current) use of insulin: Secondary | ICD-10-CM | POA: Diagnosis not present

## 2019-03-18 DIAGNOSIS — E119 Type 2 diabetes mellitus without complications: Secondary | ICD-10-CM | POA: Insufficient documentation

## 2019-03-18 MED ORDER — PREDNISONE 50 MG PO TABS
60.0000 mg | ORAL_TABLET | Freq: Once | ORAL | Status: AC
  Administered 2019-03-18: 60 mg via ORAL
  Filled 2019-03-18: qty 1

## 2019-03-18 MED ORDER — KETOROLAC TROMETHAMINE 60 MG/2ML IM SOLN
40.0000 mg | Freq: Once | INTRAMUSCULAR | Status: AC
  Administered 2019-03-18: 07:00:00 39 mg via INTRAMUSCULAR
  Filled 2019-03-18: qty 2

## 2019-03-18 MED ORDER — DIAZEPAM 5 MG PO TABS: 2.5000 mg | ORAL_TABLET | Freq: Two times a day (BID) | ORAL | 0 refills | Status: DC | PRN

## 2019-03-18 MED ORDER — MELOXICAM 7.5 MG PO TABS: 7.5000 mg | ORAL_TABLET | Freq: Every day | ORAL | 0 refills | Status: DC

## 2019-03-18 MED ORDER — PREDNISONE 20 MG PO TABS: ORAL_TABLET | ORAL | 0 refills | Status: DC

## 2019-03-18 NOTE — ED Provider Notes (Signed)
[facility] EMERGENCY DEPARTMENT Provider Note   CSN: [id number] Arrival date & time: 03/18/19  0602    History   Chief Complaint Chief Complaint  Patient presents with   Hip Pain    HPI [name] is a 53 y.o. female.      Hip Pain  This is a new problem. The current episode started yesterday. The problem occurs constantly. The problem has been gradually worsening. Pertinent negatives include no chest pain, no abdominal pain, no headaches and no shortness of breath. Nothing aggravates the symptoms. Nothing relieves the symptoms. She has tried nothing for the symptoms. The treatment provided no relief.    Past Medical History:  Diagnosis Date   Diabetes mellitus    insulin pump 2013   Hypercholesteremia    Hypertension    Kidney stone    Neuropathy    Obesity    Obstructive sleep apnea    Palpitations    Shortness of breath     Patient Active Problem List   Diagnosis Date Noted   Palpitations 12/08/2014   Chest pain 12/08/2014   Obstructive sleep apnea 12/08/2014   Incarcerated umbilical hernia 03/25/2014   Cellulitis 02/19/2014   Pelvic pain 07/15/2013   Depression 07/15/2013   Decreased libido 12/23/2012   Dyspnea 11/12/2012   Headache(784.0) 10/14/2012   Neuropathy 10/14/2012   Candidal intertrigo 09/12/2012   Diabetes mellitus type II, uncontrolled (HCC) 05/10/2012   Hypertension 05/10/2012   HYPERLIPIDEMIA 09/29/2008   OBESITY 08/31/2008   NEPHROLITHIASIS 08/31/2008    Past Surgical History:  Procedure Laterality Date   CARDIAC CATHETERIZATION  12/11/2012   normal coronary arteries   CESAREAN SECTION  1994;2000   [facility]   CHOLECYSTECTOMY  1992   CYSTOSCOPY W/ URETERAL STENT PLACEMENT  05/08/2012   Procedure: CYSTOSCOPY WITH RETROGRADE PYELOGRAM/URETERAL STENT PLACEMENT;  Surgeon: Ky Barban, MD;  Location: [facility];  Service: Urology;  Laterality: Right;   FOOT SURGERY Right March, 2013   [facility]-removal of bone spur   HYSTEROSCOPY WITH THERMACHOICE  04/25/2012   Procedure: HYSTEROSCOPY WITH THERMACHOICE;  Surgeon: Lazaro Arms, MD;  Location: [facility];  Service: Gynecology;  Laterality: N/A;  total therapy time= 11 minutes 42 seconds; 34 ml D5w  in and 34 ml D5w out; temp =87 degrees F   LEFT HEART CATHETERIZATION WITH CORONARY ANGIOGRAM N/A 12/11/2012   Procedure: LEFT HEART CATHETERIZATION WITH CORONARY ANGIOGRAM;  Surgeon: Runell Gess, MD;  Location: [facility] CATH LAB;  Service: Cardiovascular;  Laterality: N/A;   Sinus sergery  1988   [place]   UMBILICAL HERNIA REPAIR N/A 03/25/2014   Procedure: UMBILICAL HERNIA REPAIR;  Surgeon: Marlane Hatcher, MD;  Location: [facility];  Service: General;  Laterality: N/A;   uterine ablation       OB History    Gravida  2   Para  2   Term  2   Preterm      AB      Living  2     SAB      TAB      Ectopic      Multiple      Live Births               Home Medications    Prior to Admission medications   Medication Sig Start Date End Date Taking? Authorizing Provider  ALPRAZolam Prudy Feeler) 1 MG tablet TK 1/2 TO 1 T PO QD 02/04/18   [provider]  buPROPion [facility]  XL) 150 MG 24 hr tablet TK 1 T PO D 12/29/17   [provider]  diazepam (VALIUM) 5 MG tablet Take 0.5 tablets (2.5 mg total) by mouth every 12 (twelve) hours as needed (spasms/leg pain). 03/18/19   Jemal Miskell, Barbara CowerJason, MD  DULoxetine (CYMBALTA) 20 MG capsule Take 20 mg by mouth daily.    [provider]  DULoxetine (CYMBALTA) 60 MG capsule TK 1 C PO D 02/17/18   [provider]  escitalopram (LEXAPRO) 10 MG tablet TK 1 T PO Q NIGHT 02/07/18   [provider]  gabapentin (NEURONTIN) 300 MG capsule Take 300-600 mg by mouth 3 (three) times daily. Patient takes 1 capsule in the morning and 1 capsule in the afternoon and 2 capsules at night    [provider]  HUMALOG 100 UNIT/ML injection  01/06/15   [provider]  LORazepam (ATIVAN) 0.5 MG tablet TK 1 T PO ONCE D PRA 01/29/18   [provider]  meloxicam (MOBIC) 7.5 MG tablet Take 1 tablet (7.5 mg total) by mouth daily. 03/18/19   Jaysen Wey, Barbara CowerJason, MD  predniSONE (DELTASONE) 20 MG tablet 3 tabs po daily x 3 days, then 2 tabs x 3 days, then 1.5 tabs x 3 days, then 1 tab x 3 days, then 0.5 tabs x 3 days 03/18/19   Cristin Penaflor, Barbara CowerJason, MD  ranitidine (ZANTAC) 150 MG tablet Take 150 mg by mouth 2 (two) times daily.    [provider]  traMADol (ULTRAM-ER) 100 MG 24 hr tablet Take 100 mg by mouth daily as needed for pain.    [provider]    Family History Family History  Problem Relation Age of Onset   Diabetes Mother    Depression Paternal Grandmother    Depression Paternal Grandfather    Heart disease Other    Cancer Other    Diabetes Other    Achalasia Other    Anesthesia problems Neg Hx    Hypotension Neg Hx    Malignant hyperthermia Neg Hx    Pseudochol deficiency Neg Hx     Social History Social History   Tobacco Use   Smoking status: Never Smoker   Smokeless tobacco: Never Used  Substance Use Topics   Alcohol use: No   Drug use: No     Allergies   Ciprofloxacin; Penicillins; Codeine; Morphine; Sulfonamide derivatives; and Vancomycin   Review of Systems Review of Systems  Respiratory: Negative for shortness of breath.   Cardiovascular: Negative for chest pain.  Gastrointestinal: Negative for abdominal pain.  Neurological: Negative for headaches.  All other systems reviewed and are negative.    Physical Exam Updated Vital Signs BP (!) 141/72 (BP Location: Right Arm)    Pulse 90    Ht 4\' 8"  (1.422 m)    Wt 89.8 kg    SpO2 99%    BMI 44.39 kg/m   Physical Exam Vitals signs and nursing note reviewed.  Constitutional:      Appearance: She is well-developed.  HENT:     Head: Normocephalic and atraumatic.     Mouth/Throat:     Mouth: Mucous membranes are moist.  Eyes:      Extraocular Movements: Extraocular movements intact.     Conjunctiva/sclera: Conjunctivae normal.  Neck:     Musculoskeletal: Normal range of motion.  Cardiovascular:     Rate and Rhythm: Normal rate and regular rhythm.  Pulmonary:     Effort: No respiratory distress.     Breath sounds: No  stridor.  Abdominal:     General: Abdomen is flat. There is no distension.  Neurological:     General: No focal deficit present.     Mental Status: She is alert.     Comments: Symmetric and normal dorsiflexion plantarflexion of both feet, no saddle anesthesia, normal strength in proximal hip muscles.  Symmetric sensation bilaterally.      ED Treatments / Results  Labs (all labs ordered are listed, but only abnormal results are displayed) Labs Reviewed - No data to display  EKG None  Radiology No results found.  Procedures Procedures (including critical care time)  Medications Ordered in ED Medications  ketorolac (TORADOL) injection 39 mg (39 mg Intramuscular Given 03/18/19 0654)  predniSONE (DELTASONE) tablet 60 mg (60 mg Oral Given 03/18/19 9628)     Initial Impression / Assessment and Plan / ED Course  I have reviewed the triage vital signs and the nursing notes.  Pertinent labs & imaging results that were available during my care of the patient were reviewed by me and considered in my medical decision making (see chart for details).   S/s c/w radiculopathy with component of muscle spasms. Will treat accordingly. No neuro deficits to suggest acute compression or need for urgent imaging/workup.   Final Clinical Impressions(s) / ED Diagnoses   Final diagnoses:  Right hip pain    ED Discharge Orders         Ordered    predniSONE (DELTASONE) 20 MG tablet     03/18/19 0645    meloxicam (MOBIC) 7.5 MG tablet  Daily     03/18/19 0645    diazepam (VALIUM) 5 MG tablet  Every 12 hours PRN     03/18/19 0645           Markie Frith, Barbara Cower, MD 03/18/19 2321

## 2019-03-18 NOTE — ED Triage Notes (Signed)
Pt C/O right hip pain that started 3 days ago. Pt denies injury to the area or falls. Pt states when she "stretches her leg out it hurts worse." Pt states the pain radiates down the leg.

## 2019-04-23 ENCOUNTER — Encounter: Payer: Self-pay | Admitting: Orthopaedic Surgery

## 2019-04-23 ENCOUNTER — Ambulatory Visit (INDEPENDENT_AMBULATORY_CARE_PROVIDER_SITE_OTHER): Payer: 59 | Admitting: Orthopaedic Surgery

## 2019-04-23 ENCOUNTER — Other Ambulatory Visit: Payer: Self-pay

## 2019-04-23 ENCOUNTER — Ambulatory Visit (INDEPENDENT_AMBULATORY_CARE_PROVIDER_SITE_OTHER): Payer: 59

## 2019-04-23 VITALS — Ht 61.0 in | Wt 212.0 lb

## 2019-04-23 DIAGNOSIS — M545 Low back pain, unspecified: Secondary | ICD-10-CM

## 2019-04-23 DIAGNOSIS — G8929 Other chronic pain: Secondary | ICD-10-CM

## 2019-04-23 MED ORDER — DIAZEPAM 5 MG PO TABS: ORAL_TABLET | ORAL | 0 refills | Status: DC

## 2019-04-23 NOTE — Addendum Note (Signed)
Addended by: Rogers Seeds on: 04/23/2019 04:35 PM   Modules accepted: Orders

## 2019-04-23 NOTE — Progress Notes (Signed)
Office Visit Note   Patient: [name]           Date of Birth: 03/22/1966           MRN: [id number] Visit Date: 04/23/2019              Requested by: Benita StabileHall, John Z, MD [id number] [place], [place] [id number] PCP: Benita StabileHall, John Z, MD   Assessment & Plan: Visit Diagnoses:  1. Chronic bilateral low back pain, unspecified whether sciatica present     Plan: We will obtain MRI scan for evaluation of chronic low back pain she has been through anti-inflammatories activity modification several months of chiropractic treatment without relief.  Valium prescribed for claustrophobia for MRI scan.  Office follow-up after scan for review.  Patient has L5 spondylolysis with associated spinal listhesis and disc space narrowing.  Positive nerve root tension signs on the right and pain at times it rated as severe bothers her on daily activities making it difficult for work.  She has about stronger medication and I would recommend she avoid starting narcotic medication for this problem.  Plan will be diagnostic imaging and then we can determine what treatment options are available.  Follow-Up Instructions: No follow-ups on file.   Orders:  Orders Placed This Encounter  Procedures  . XR Lumbar Spine 2-3 Views   No orders of the defined types were placed in this encounter.     Procedures: No procedures performed   Clinical Data: No additional findings.   Subjective: Chief Complaint  Patient presents with  . Lower Back - Pain    HPI 53 year old female with chronic back pain problems worse over the last year.  She is a type I diabetic with an A1c of 7.5 with an insulin pump diabetes since age 53 and [facility] with persistent problems diagnosed with spondylolysis and spondylolisthesis L5-S1.  Patient is failed conservative treatment and is sent here for evaluation for further imaging and possible surgical discussion.  Patient's had anti-inflammatories Tylenol  currently taking ibuprofen without relief.  She has more pain on the right than the left leg no associated bowel bladder symptoms.  Review of Systems positive for morbid obesity with BMI at 40.  Positive hyperlipidemia history kidney stones.  Diabetes with insulin pump.  History of depression, cellulitis, obstructive sleep apnea and history of palpitations. Previous C-section 7294 and 2000.  Gallbladder surgery 2009 sinus surgery 2010.  Non-smoker nondrinker.  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  Objective: Vital Signs: Ht 5\' 1"  (1.549 m)   Wt 212 lb (96.2 kg)   BMI 40.06 kg/m   Physical Exam Constitutional:      Appearance: She is well-developed.  HENT:     Head: Normocephalic.     Right Ear: External ear normal.     Left Ear: External ear normal.  Eyes:     Pupils: Pupils are equal, round, and reactive to light.  Neck:     Thyroid: No thyromegaly.     Trachea: No tracheal deviation.  Cardiovascular:     Rate and Rhythm: Normal rate.  Pulmonary:     Effort: Pulmonary effort is normal.  Abdominal:     Palpations: Abdomen is soft.     Comments: Abdominal obesity  Skin:    General: Skin is warm and dry.  Neurological:     Mental Status: She is alert and oriented to person, place, and time.  Psychiatric:  Behavior: Behavior normal.     Ortho Exam positive straight leg raising on the right at 80 degrees.  Positive popliteal compression test.  Knee and ankle jerk are 2+ and symmetrical anterior tib EHL is intact no plantar foot lesions.  Negative logroll to the hips.  Mild trochanteric bursal tenderness on the right negative on the left she has sciatic notch tenderness on the right.  Some tenderness palpation lumbar spine skin is intact.  Good gastroc strength right and left.  She ambulates with a short stride slow deliberate gait slight waddle to her gait negative Trendelenburg.  Ambulation is with minimal knee flexion right and left.  Specialty Comments:  No  specialty comments available.  Imaging: No results found.   PMFS History: Patient Active Problem List   Diagnosis Date Noted  . Palpitations 12/08/2014  . Chest pain 12/08/2014  . Obstructive sleep apnea 12/08/2014  . Incarcerated umbilical hernia 03/25/2014  . Cellulitis 02/19/2014  . Pelvic pain 07/15/2013  . Depression 07/15/2013  . Decreased libido 12/23/2012  . Dyspnea 11/12/2012  . Headache(784.0) 10/14/2012  . Neuropathy 10/14/2012  . Candidal intertrigo 09/12/2012  . Diabetes mellitus type II, uncontrolled (HCC) 05/10/2012  . Hypertension 05/10/2012  . HYPERLIPIDEMIA 09/29/2008  . OBESITY 08/31/2008  . NEPHROLITHIASIS 08/31/2008   Past Medical History:  Diagnosis Date  . Diabetes mellitus    insulin pump 2013  . Hypercholesteremia   . Hypertension   . Kidney stone   . Neuropathy   . Obesity   . Obstructive sleep apnea   . Palpitations   . Shortness of breath     Family History  Problem Relation Age of Onset  . Diabetes Mother   . Depression Paternal Grandmother   . Depression Paternal Grandfather   . Heart disease Other   . Cancer Other   . Diabetes Other   . Achalasia Other   . Anesthesia problems Neg Hx   . Hypotension Neg Hx   . Malignant hyperthermia Neg Hx   . Pseudochol deficiency Neg Hx     Past Surgical History:  Procedure Laterality Date  . CARDIAC CATHETERIZATION  12/11/2012   normal coronary arteries  . CESAREAN SECTION  1994;2000   [facility]  . CHOLECYSTECTOMY  1992  . CYSTOSCOPY W/ URETERAL STENT PLACEMENT  05/08/2012   Procedure: CYSTOSCOPY WITH RETROGRADE PYELOGRAM/URETERAL STENT PLACEMENT;  Surgeon: Ky Barban, MD;  Location: [facility];  Service: Urology;  Laterality: Right;  . FOOT SURGERY Right March, 2013   [facility]-removal of bone spur  . HYSTEROSCOPY WITH THERMACHOICE  04/25/2012   Procedure: HYSTEROSCOPY WITH THERMACHOICE;  Surgeon: Lazaro Arms, MD;  Location: [facility];  Service: Gynecology;  Laterality: N/A;   total therapy time= 11 minutes 42 seconds; 34 ml D5w  in and 34 ml D5w out; temp =87 degrees F  . LEFT HEART CATHETERIZATION WITH CORONARY ANGIOGRAM N/A 12/11/2012   Procedure: LEFT HEART CATHETERIZATION WITH CORONARY ANGIOGRAM;  Surgeon: Runell Gess, MD;  Location: [facility] CATH LAB;  Service: Cardiovascular;  Laterality: N/A;  . Sinus sergery  1988   [place]  . UMBILICAL HERNIA REPAIR N/A 03/25/2014   Procedure: UMBILICAL HERNIA REPAIR;  Surgeon: Marlane Hatcher, MD;  Location: [facility];  Service: General;  Laterality: N/A;  . uterine ablation     Social History   Occupational History    Employer: [employer]    Employer: [employer]  Tobacco Use  . Smoking status: Never Smoker  . Smokeless tobacco: Never Used  Substance and Sexual Activity  . Alcohol use: No  . Drug use: No  . Sexual activity: Yes    Partners: Male    Comment: ablation

## 2019-05-06 ENCOUNTER — Ambulatory Visit (HOSPITAL_COMMUNITY): Payer: 59

## 2019-05-07 ENCOUNTER — Other Ambulatory Visit: Payer: Self-pay

## 2019-05-07 ENCOUNTER — Ambulatory Visit: Payer: 59 | Admitting: Orthopaedic Surgery

## 2019-05-15 ENCOUNTER — Ambulatory Visit: Payer: Managed Care, Other (non HMO) | Admitting: Gastroenterology

## 2019-05-15 ENCOUNTER — Ambulatory Visit (HOSPITAL_COMMUNITY): Payer: 59

## 2019-05-18 ENCOUNTER — Other Ambulatory Visit (HOSPITAL_COMMUNITY): Payer: Self-pay | Admitting: Unknown Physician Specialty

## 2019-05-20 ENCOUNTER — Other Ambulatory Visit (HOSPITAL_COMMUNITY): Payer: Self-pay | Admitting: Internal Medicine

## 2019-05-20 ENCOUNTER — Other Ambulatory Visit: Payer: Self-pay

## 2019-05-20 ENCOUNTER — Ambulatory Visit (HOSPITAL_COMMUNITY)
Admission: RE | Admit: 2019-05-20 | Discharge: 2019-05-20 | Disposition: A | Discharge status: Home / Self Care | Payer: 59 | Source: Ambulatory Visit | Attending: Orthopaedic Surgery | Admitting: Orthopaedic Surgery

## 2019-05-20 DIAGNOSIS — M545 Low back pain: Secondary | ICD-10-CM | POA: Insufficient documentation

## 2019-05-20 DIAGNOSIS — G8929 Other chronic pain: Secondary | ICD-10-CM | POA: Diagnosis present

## 2019-05-20 DIAGNOSIS — Z1231 Encounter for screening mammogram for malignant neoplasm of breast: Secondary | ICD-10-CM

## 2019-05-21 ENCOUNTER — Encounter: Payer: Self-pay | Admitting: Orthopaedic Surgery

## 2019-05-21 ENCOUNTER — Ambulatory Visit (INDEPENDENT_AMBULATORY_CARE_PROVIDER_SITE_OTHER): Payer: 59 | Admitting: Orthopaedic Surgery

## 2019-05-21 DIAGNOSIS — M5136 Other intervertebral disc degeneration, lumbar region: Secondary | ICD-10-CM | POA: Diagnosis not present

## 2019-05-21 NOTE — Progress Notes (Signed)
Office Visit Note   Patient: [name]           Date of Birth: Mar 18, 1966           MRN: [id number] Visit Date: 05/21/2019              Requested by: Benita Stabile, MD [address] [place],  [place] [address] PCP: Benita Stabile, MD   Assessment & Plan: Visit Diagnoses:  1. Other intervertebral disc degeneration, lumbar region     Plan: We reviewed MRI scan and gave her a copy of the report.  She needs to continue to work on walking weight loss to help unload her back.  We discussed the degenerative anterolisthesis and potential for progression and development of neurogenic claudication symptoms.  Currently her problem is been back pain with some leg pain.  We discussed possible epidural however with her diabetes I recommend trying to avoid this.  She can return if she develops increased symptoms.  Follow-Up Instructions: Return if symptoms worsen or fail to improve.   Orders:  No orders of the defined types were placed in this encounter.  No orders of the defined types were placed in this encounter.     Procedures: No procedures performed   Clinical Data: No additional findings.   Subjective: Chief Complaint  Patient presents with  . Lower Back - Follow-up    MRI Lumbar Review    HPI Patient returns with ongoing chronic back pain.  Diabetic since age 63 she has an insulin pump.  BMI remains elevated at 45.  She is here for review of MRI scan with continued problems with back pain.  Review of Systems 14 point system updated unchanged from 04/23/2019 other than as mentioned in HPI.   Objective: Vital Signs: BP 139/82   Pulse 91   Ht 4\' 9"  (1.448 m)   Wt 208 lb (94.3 kg)   BMI 45.01 kg/m   Physical Exam Constitutional:      Appearance: She is well-developed.  HENT:     Head: Normocephalic.     Right Ear: External ear normal.     Left Ear: External ear normal.  Eyes:     Pupils: Pupils are equal, round, and reactive to light.  Neck:     Thyroid: No  thyromegaly.     Trachea: No tracheal deviation.  Cardiovascular:     Rate and Rhythm: Normal rate.  Pulmonary:     Effort: Pulmonary effort is normal.  Abdominal:     Palpations: Abdomen is soft.  Skin:    General: Skin is warm and dry.  Neurological:     Mental Status: She is alert and oriented to person, place, and time.  Psychiatric:        Behavior: Behavior normal.     Ortho Exam patient has intact lower extremity reflexes knee and ankle jerk anterior tib gastrocsoleus is intact pain with straight leg raising 80 degrees on the right side negative on the left.  Mild trochanteric bursal tenderness negative logroll the hips.  No calf tenderness.  Venous stasis changes.  Specialty Comments:  No specialty comments available.  Imaging:CLINICAL DATA:  Back pain radiating to both legs  EXAM: MRI LUMBAR SPINE WITHOUT CONTRAST  TECHNIQUE: Multiplanar, multisequence MR imaging of the lumbar spine was performed. No intravenous contrast was administered.  COMPARISON:  None.  FINDINGS: Segmentation: Normal. The lowest disc space is considered to be L5-S1.  Alignment:  Grade 1 anterolisthesis at  L5-S1  Vertebrae: No acute compression fracture, discitis-osteomyelitis of focal marrow lesion.  Conus medullaris and cauda equina: The conus medullaris terminates at the L1 level. The cauda equina and conus medullaris are both normal.  Paraspinal and other soft tissues: Large right renal sinus cysts.  Disc levels: Sagittal plane imaging includes the T11-12 disc level through the upper sacrum, with axial imaging of the L1-2 to L5-S1 disc levels.  The visualized disc spaces above L4 are normal.  L4-5: Disc space narrowing with small left subarticular protrusion and annular fissure. No spinal canal stenosis or neural foraminal stenosis.  L5-S1: Small central disc extrusion with superior migration to the infrapedicle level. No spinal canal stenosis. Mild left  foraminal stenosis. Mild facet hypertrophy.  The visualized portion of the sacrum is normal.  IMPRESSION: 1. Grade 1 L5-S1 anterolisthesis with small left subarticular disc extrusion causing mild left foraminal stenosis. 2. L4-5 disc space narrowing with small left subarticular protrusion and annular fissure. No associated stenosis.   Electronically Signed   By: Ulyses Jarred M.D.   On: 05/20/2019 17:15    PMFS History: Patient Active Problem List   Diagnosis Date Noted  . Other intervertebral disc degeneration, lumbar region 05/24/2019  . Palpitations 12/08/2014  . Chest pain 12/08/2014  . Obstructive sleep apnea 12/08/2014  . Incarcerated umbilical hernia 63/33/5456  . Cellulitis 02/19/2014  . Pelvic pain 07/15/2013  . Depression 07/15/2013  . Decreased libido 12/23/2012  . Dyspnea 11/12/2012  . Headache(784.0) 10/14/2012  . Neuropathy 10/14/2012  . Candidal intertrigo 09/12/2012  . Diabetes mellitus type II, uncontrolled ([place]) 05/10/2012  . Hypertension 05/10/2012  . HYPERLIPIDEMIA 09/29/2008  . OBESITY 08/31/2008  . NEPHROLITHIASIS 08/31/2008   Past Medical History:  Diagnosis Date  . Diabetes mellitus    insulin pump 2013  . Hypercholesteremia   . Hypertension   . Kidney stone   . Neuropathy   . Obesity   . Obstructive sleep apnea   . Palpitations   . Shortness of breath     Family History  Problem Relation Age of Onset  . Diabetes Mother   . Depression Paternal Grandmother   . Depression Paternal Grandfather   . Heart disease Other   . Cancer Other   . Diabetes Other   . Achalasia Other   . Anesthesia problems Neg Hx   . Hypotension Neg Hx   . Malignant hyperthermia Neg Hx   . Pseudochol deficiency Neg Hx     Past Surgical History:  Procedure Laterality Date  . CARDIAC CATHETERIZATION  12/11/2012   normal coronary arteries  . CESAREAN SECTION  1994;2000   [facility]  . CHOLECYSTECTOMY  1992  . CYSTOSCOPY W/ URETERAL STENT PLACEMENT   05/08/2012   Procedure: CYSTOSCOPY WITH RETROGRADE PYELOGRAM/URETERAL STENT PLACEMENT;  Surgeon: Marissa Nestle, MD;  Location: [facility];  Service: Urology;  Laterality: Right;  . FOOT SURGERY Right March, 2013   [facility]-removal of bone spur  . HYSTEROSCOPY WITH THERMACHOICE  04/25/2012   Procedure: HYSTEROSCOPY WITH THERMACHOICE;  Surgeon: Florian Buff, MD;  Location: [facility];  Service: Gynecology;  Laterality: N/A;  total therapy time= 11 minutes 42 seconds; 34 ml D5w  in and 34 ml D5w out; temp =87 degrees F  . LEFT HEART CATHETERIZATION WITH CORONARY ANGIOGRAM N/A 12/11/2012   Procedure: LEFT HEART CATHETERIZATION WITH CORONARY ANGIOGRAM;  Surgeon: Lorretta Harp, MD;  Location[facility] CATH LAB;  Service: Cardiovascular;  Laterality: N/A;  . Sinus [place]  .  UMBILICAL HERNIA REPAIR N/A 03/25/2014   Procedure: UMBILICAL HERNIA REPAIR;  Surgeon: Marlane Hatcher, MD;  Location: [facility];  Service: General;  Laterality: N/A;  . uterine ablation     Social History   Occupational History    Employer: [employer]    Employer: [employer]  Tobacco Use  . Smoking status: Never Smoker  . Smokeless tobacco: Never Used  Substance and Sexual Activity  . Alcohol use: No  . Drug use: No  . Sexual activity: Yes    Partners: Male    Comment: ablation

## 2019-05-24 DIAGNOSIS — M5136 Other intervertebral disc degeneration, lumbar region: Secondary | ICD-10-CM | POA: Insufficient documentation

## 2019-05-25 ENCOUNTER — Telehealth: Payer: Self-pay | Admitting: Orthopaedic Surgery

## 2019-05-25 NOTE — Telephone Encounter (Signed)
Ov notes faxed to [facility] [phone]

## 2019-05-28 ENCOUNTER — Ambulatory Visit (HOSPITAL_COMMUNITY): Admission: RE | Admit: 2019-05-28 | Payer: 59 | Source: Ambulatory Visit

## 2019-06-10 ENCOUNTER — Ambulatory Visit (HOSPITAL_COMMUNITY)
Admission: RE | Admit: 2019-06-10 | Discharge: 2019-06-10 | Disposition: A | Discharge status: Home / Self Care | Payer: 59 | Source: Ambulatory Visit | Attending: Internal Medicine | Admitting: Internal Medicine

## 2019-06-10 ENCOUNTER — Other Ambulatory Visit: Payer: Self-pay

## 2019-06-10 ENCOUNTER — Encounter (HOSPITAL_COMMUNITY): Payer: Self-pay

## 2019-06-10 DIAGNOSIS — Z1231 Encounter for screening mammogram for malignant neoplasm of breast: Secondary | ICD-10-CM | POA: Insufficient documentation

## 2019-07-15 ENCOUNTER — Encounter: Payer: Self-pay | Admitting: Nurse Practitioner

## 2019-07-15 ENCOUNTER — Encounter: Payer: Self-pay | Admitting: *Deleted

## 2019-07-15 ENCOUNTER — Telehealth: Payer: Self-pay | Admitting: *Deleted

## 2019-07-15 ENCOUNTER — Encounter

## 2019-07-15 ENCOUNTER — Other Ambulatory Visit: Payer: Self-pay

## 2019-07-15 ENCOUNTER — Ambulatory Visit (INDEPENDENT_AMBULATORY_CARE_PROVIDER_SITE_OTHER): Payer: 59 | Admitting: Nurse Practitioner

## 2019-07-15 DIAGNOSIS — Z0000 Encounter for general adult medical examination without abnormal findings: Secondary | ICD-10-CM | POA: Diagnosis not present

## 2019-07-15 DIAGNOSIS — Z1211 Encounter for screening for malignant neoplasm of colon: Secondary | ICD-10-CM

## 2019-07-15 MED ORDER — NA SULFATE-K SULFATE-MG SULF 17.5-3.13-1.6 GM/177ML PO SOLN: 1.0000 | Freq: Once | ORAL | 0 refills | Status: AC

## 2019-07-15 NOTE — Telephone Encounter (Signed)
PA for TCS was approved via [facility] website. Auth# [id number] Dates 09/17/2019-12/16/2019

## 2019-07-15 NOTE — Progress Notes (Signed)
Primary Care Physician:  Benita Stabile, MD Primary Gastroenterologist:  Dr. Jena Gauss  Chief Complaint  Patient presents with  . Consult    TCS. never had TCS prior. father hx polyps    HPI:   [name] is a 53 y.o. female who presents on referral from primary care to schedule colonoscopy.  Nurse/phone triage was deferred office visit due to medications likely necessitating augmented sedation.  Reviewed information provided with referral including multiple office visits from December 19 through March 2020.  Noted no previous colonoscopy and was referred to GI for first-ever screening colonoscopy.  No history of colonoscopy planned in her system.  Today she states states she's nervous about having a colonoscopy (she's nebver had one before.) Her father has had polyps. Denies abdominal pain, N/V, hematochezia, melena, fever, chills, unintentional weight loss. Denies URI or flu-like symptoms. Denies loss of sense of taste or smell. Denies chest pain, dyspnea, dizziness, lightheadedness, syncope, near syncope. Denies any other upper or lower GI symptoms.  She is diabetic and wears an insulin pump.  Past Medical History:  Diagnosis Date  . Diabetes mellitus    insulin pump 2013  . Hypercholesteremia   . Hypertension   . Kidney stone   . Neuropathy   . Obesity   . Obstructive sleep apnea   . Palpitations   . Shortness of breath     Past Surgical History:  Procedure Laterality Date  . CARDIAC CATHETERIZATION  12/11/2012   normal coronary arteries  . CESAREAN SECTION  1994;2000   [facility]  . CHOLECYSTECTOMY  1992  . CYSTOSCOPY W/ URETERAL STENT PLACEMENT  05/08/2012   Procedure: CYSTOSCOPY WITH RETROGRADE PYELOGRAM/URETERAL STENT PLACEMENT;  Surgeon: Ky Barban, MD;  Location: [facility];  Service: Urology;  Laterality: Right;  . FOOT SURGERY Right March, 2013   [facility]-removal of bone spur  . HYSTEROSCOPY WITH THERMACHOICE  04/25/2012   Procedure: HYSTEROSCOPY WITH  THERMACHOICE;  Surgeon: Lazaro Arms, MD;  Location: [facility];  Service: Gynecology;  Laterality: N/A;  total therapy time= 11 minutes 42 seconds; 34 ml D5w  in and 34 ml D5w out; temp =87 degrees F  . LEFT HEART CATHETERIZATION WITH CORONARY ANGIOGRAM N/A 12/11/2012   Procedure: LEFT HEART CATHETERIZATION WITH CORONARY ANGIOGRAM;  Surgeon: Runell Gess, MD;  Location: [facility] CATH LAB;  Service: Cardiovascular;  Laterality: N/A;  . Sinus sergery  1988   [place]  . UMBILICAL HERNIA REPAIR N/A 03/25/2014   Procedure: UMBILICAL HERNIA REPAIR;  Surgeon: Marlane Hatcher, MD;  Location: [facility];  Service: General;  Laterality: N/A;  . uterine ablation      Current Outpatient Medications  Medication Sig Dispense Refill  . ALPRAZolam (XANAX) 1 MG tablet TK 1/2 TO 1 T PO QD  2  . ARIPiprazole (ABILIFY) 2 MG tablet Take 2 mg by mouth daily.    Marland Kitchen buPROPion (WELLBUTRIN XL) 150 MG 24 hr tablet TK 1 T PO D  2  . DULoxetine (CYMBALTA) 60 MG capsule TK 1 C PO D  2  . escitalopram (LEXAPRO) 10 MG tablet TK 1 T PO Q NIGHT  0  . gabapentin (NEURONTIN) 600 MG tablet Take 600 mg by mouth 3 (three) times daily. Patient takes 1 capsule in the morning and 1 capsule in the afternoon and 2 capsules at night     . HUMALOG 100 UNIT/ML injection 2.5unirs every 1 hr-insulin pump    . LORazepam (ATIVAN) 0.5 MG tablet TK 1  T PO ONCE D PRA  0  . simvastatin (ZOCOR) 20 MG tablet TK 1 T PO Q NIGHT FOR CHOLESTEROL     No current facility-administered medications for this visit.     Allergies as of 07/15/2019 - Review Complete 07/15/2019  Allergen Reaction Noted  . Ciprofloxacin Hives and Swelling 04/25/2012  . Penicillins Anaphylaxis and Rash 08/31/2008  . Codeine Other (See Comments) 08/31/2008  . Morphine Nausea And Vomiting and Other (See Comments) 08/31/2008  . Sulfonamide derivatives Other (See Comments) 08/31/2008  . Vancomycin Itching 02/19/2014    Family History  Problem Relation Age of Onset  . Diabetes  Mother   . Depression Paternal Grandmother   . Depression Paternal Grandfather   . Heart disease Other   . Cancer Other   . Diabetes Other   . Achalasia Other   . Anesthesia problems Neg Hx   . Hypotension Neg Hx   . Malignant hyperthermia Neg Hx   . Pseudochol deficiency Neg Hx     Social History   Socioeconomic History  . Marital status: Married    Spouse name: Not on file  . Number of children: 2  . Years of education: college  . Highest education level: Not on file  Occupational History    Employer: [employer]    Employer: [employer]  Social Needs  . Financial resource strain: Not on file  . Food insecurity    Worry: Not on file    Inability: Not on file  . Transportation needs    Medical: Not on file    Non-medical: Not on file  Tobacco Use  . Smoking status: Never Smoker  . Smokeless tobacco: Never Used  Substance and Sexual Activity  . Alcohol use: No  . Drug use: No  . Sexual activity: Yes    Partners: Male    Comment: ablation  Lifestyle  . Physical activity    Days per week: Not on file    Minutes per session: Not on file  . Stress: Not on file  Relationships  . Social Musicianconnections    Talks on phone: Not on file    Gets together: Not on file    Attends religious service: Not on file    Active member of club or organization: Not on file    Attends meetings of clubs or organizations: Not on file    Relationship status: Not on file  . Intimate partner violence    Fear of current or ex partner: Not on file    Emotionally abused: Not on file    Physically abused: Not on file    Forced sexual activity: Not on file  Other Topics Concern  . Not on file  Social History Narrative   Regular exercise: walks    Caffeine use: not regularly   Employed and full time student at [employer]    Review of Systems: General: Negative for anorexia, weight loss, fever, chills, fatigue, weakness. ENT: Negative for hoarseness, difficulty swallowing. CV: Negative  for chest pain, angina, palpitations, peripheral edema.  Respiratory: Negative for dyspnea at rest, cough, sputum, wheezing.  GI: See history of present illness. MS: Negative for joint pain, low back pain.  Derm: Negative for rash or itching.  Endo: Negative for unusual weight change.  Heme: Negative for bruising or bleeding. Allergy: Negative for rash or hives.    Physical Exam: BP (!) 151/79   Pulse 91   Temp (!) 97.1 F (36.2 C) (Oral)   Ht 4\' 8"  (  1.422 m)   Wt 236 lb 9.6 oz (107.3 kg)   BMI 53.04 kg/m  General:   Alert and oriented. Pleasant and cooperative. Well-nourished and well-developed.  Head:  Normocephalic and atraumatic. Eyes:  Without icterus, sclera clear and conjunctiva pink.  Ears:  Normal auditory acuity. Cardiovascular:  S1, S2 present without murmurs appreciated. Extremities without clubbing or edema. Respiratory:  Clear to auscultation bilaterally. No wheezes, rales, or rhonchi. No distress.  Gastrointestinal:  +BS, soft, non-tender and non-distended. No HSM noted. No guarding or rebound. No masses appreciated.  Rectal:  Deferred  Musculoskalatal:  Symmetrical without gross deformities. Neurologic:  Alert and oriented x4;  grossly normal neurologically. Psych:  Alert and cooperative. Normal mood and affect. Heme/Lymph/Immune: No excessive bruising noted.    07/15/2019 3:30 PM   Disclaimer: This note was dictated with voice recognition software. Similar sounding words can inadvertently be transcribed and may not be corrected upon review.

## 2019-07-15 NOTE — Patient Instructions (Signed)
Your health issues we discussed today were:   Need for colonoscopy: 1. I am glad you are doing well! 2. We will schedule your colonoscopy for you 3. Further recommendations will be made after your colonoscopy  Overall I recommend:  1. Continue your other current medications 2. Return for follow-up based on recommendations made after your procedure 3. Call us if you have any questions or concerns.   Because of recent events of COVID-19 ("Coronavirus"), follow CDC recommendations:  Wash your hand frequently Avoid touching your face Stay away from people who are sick If you have symptoms such as fever, cough, shortness of breath then call your healthcare provider for further guidance If you are sick, STAY AT HOME unless otherwise directed by your healthcare provider. Follow directions from state and national officials regarding staying safe   At [facility] we value your feedback. You may receive a survey about your visit today. Please share your experience as we strive to create trusting relationships with our patients to provide genuine, compassionate, quality care.  We appreciate your understanding and patience as we review any laboratory studies, imaging, and other diagnostic tests that are ordered as we care for you. Our office policy is 5 business days for review of these results, and any emergent or urgent results are addressed in a timely manner for your best interest. If you do not hear from our office in 1 week, please contact us.   We also encourage the use of MyChart, which contains your medical information for your review as well. If you are not enrolled in this feature, an access code is on this after visit summary for your convenience. Thank you for allowing [place] to be involved in your care.  It was great to see you today!  I hope you have a great summer!!

## 2019-07-15 NOTE — Assessment & Plan Note (Signed)
The patient is currently due for first-ever screening colonoscopy.  Her dad has had polyps but she states those were "normal."  She is never had a colonoscopy before.  She is generally asymptomatic from a GI standpoint.  We will proceed with colonoscopy at this time.  Proceed with TCS on propofol/MAC with Dr. Gala Romney in near future: the risks, benefits, and alternatives have been discussed with the patient in detail. The patient states understanding and desires to proceed.  Patient is currently on Xanax, Wellbutrin, Cymbalta, Neurontin, Ativan.  No other anticoagulants, anxiolytics, chronic pain medications, or antidepressants.  She does have diabetes but has an insulin pump.  She states she is able to remove it for up to 1 hour at a time.  She is not on any other diabetes medicines or iron supplements.  We will plan for the procedure on propofol/MAC to promote adequate sedation.

## 2019-07-16 ENCOUNTER — Telehealth: Payer: Self-pay | Admitting: *Deleted

## 2019-07-16 NOTE — Progress Notes (Signed)
CC'D TO PCP °

## 2019-07-16 NOTE — Telephone Encounter (Signed)
Pre-op appt letter mailed 

## 2019-08-25 ENCOUNTER — Telehealth: Payer: Self-pay | Admitting: Internal Medicine

## 2019-08-25 NOTE — Telephone Encounter (Signed)
Patient is not scheduled for COVID-19 test on 10/6. She is scheduled for pre-op appt 10/6.  Patient stated she was tested for COVID-19 on 07/25/2019 and she was positive. Saw Dr. [place] Crane because she was sick.   Called endo and spoke with Ladona Horns. Confirmed patient will not have to be retested.   Called patient. Made aware. She then decided she wants to cancel procedure and prefers to wait until December to have TCS done. Patient aware we will call to r/s once we receive that schedule.  Called endo and spoke with Maudie Mercury. She states patient will have to have COVID-19 testing in December then.

## 2019-08-25 NOTE — Telephone Encounter (Signed)
Pt is scheduled a covid test on 10/6 and procedure with RMR on 10/8. She said that she tested positive for covid back in August and was told that she should not be tested again until 3 months because it could show a false positive and will have to reschedule her procedure again. Please advise and call [phone]

## 2019-08-27 ENCOUNTER — Telehealth: Payer: Self-pay | Admitting: *Deleted

## 2019-08-27 ENCOUNTER — Other Ambulatory Visit: Payer: Self-pay | Admitting: *Deleted

## 2019-08-27 DIAGNOSIS — Z0000 Encounter for general adult medical examination without abnormal findings: Secondary | ICD-10-CM

## 2019-08-27 DIAGNOSIS — Z139 Encounter for screening, unspecified: Secondary | ICD-10-CM

## 2019-08-27 NOTE — Telephone Encounter (Signed)
Patient called back. She has rescheduled to 12/10 at 8:15am. Patient aware she will need new covid-19 test appt with new pre-op appt. She already has prep at home but unsure the name of it. She will call back to let know what which type she has at home so I can mail instructions. Will await call back

## 2019-08-27 NOTE — Telephone Encounter (Signed)
LMOVM for pt 

## 2019-08-27 NOTE — Telephone Encounter (Signed)
Called patient to schedule. She states she will call when she gets home to schedule

## 2019-08-27 NOTE — Telephone Encounter (Signed)
Pt calling back to schedule procedure.  [phone]

## 2019-08-28 ENCOUNTER — Encounter: Payer: Self-pay | Admitting: *Deleted

## 2019-08-31 ENCOUNTER — Encounter: Payer: Self-pay | Admitting: *Deleted

## 2019-09-15 ENCOUNTER — Other Ambulatory Visit (HOSPITAL_COMMUNITY): Payer: 59

## 2019-09-17 ENCOUNTER — Ambulatory Visit (HOSPITAL_COMMUNITY): Admit: 2019-09-17 | Payer: 59 | Admitting: Internal Medicine

## 2019-09-17 ENCOUNTER — Encounter (HOSPITAL_COMMUNITY): Payer: Self-pay

## 2019-09-17 SURGERY — COLONOSCOPY WITH PROPOFOL
Anesthesia: Monitor Anesthesia Care

## 2019-09-21 ENCOUNTER — Telehealth: Payer: Self-pay | Admitting: *Deleted

## 2019-09-21 NOTE — Telephone Encounter (Addendum)
Pt wants to reschedule her procedure to January.  Pt wants to be called after 3:30 tomorrow.  [phone]

## 2019-09-22 NOTE — Telephone Encounter (Signed)
Tried to call pt, no answer, LMOVM for return call.  

## 2019-09-25 ENCOUNTER — Telehealth: Payer: Self-pay | Admitting: Internal Medicine

## 2019-09-25 NOTE — Telephone Encounter (Signed)
Tried to call pt, no answer, LMOVM for return call.  

## 2019-09-25 NOTE — Telephone Encounter (Signed)
[phone] please call patient, she is wanting to schedule her procedure

## 2019-09-28 NOTE — Telephone Encounter (Signed)
Spoke to pt, TCS w/Prop w/RMR rescheduled to 12/31/19 at 11:00am. Endo scheduler informed. Pre-op appt 12/29/19 at 1:45pm, COVID test 2:45pm. Appt letter mailed with new procedure instructions.

## 2019-09-28 NOTE — Telephone Encounter (Signed)
See other phone note. Pt rescheduled.

## 2019-09-28 NOTE — Telephone Encounter (Signed)
Tried to call pt, no answer, LMOVM for return call.  

## 2019-10-01 ENCOUNTER — Telehealth: Payer: Self-pay | Admitting: *Deleted

## 2019-10-01 NOTE — Addendum Note (Signed)
Addended by: Cheron Every on: 10/01/2019 02:33 PM   Modules accepted: Orders

## 2019-10-01 NOTE — Telephone Encounter (Signed)
PA approved via [facility] website. Auth# [id number] dates 12/31/2019-03/30/2020

## 2019-11-17 ENCOUNTER — Other Ambulatory Visit (HOSPITAL_COMMUNITY): Payer: 59

## 2019-12-25 ENCOUNTER — Telehealth: Payer: Self-pay | Admitting: *Deleted

## 2019-12-25 NOTE — Patient Instructions (Signed)
Your procedure is scheduled on: 12/31/2019  Report to [facility] at  10:00   AM.  Call this number if you have problems the morning of surgery: [phone]   Remember:              Follow Directions on the letter you received from Your Physician's office regarding the Bowel Prep  :  Take these medicines the morning of surgery with A SIP OF WATER:Xanax, abilify, wellbutrin, cymbalta, lexapro and ativan            Insulin pump at basal rate   Do not wear jewelry, make-up or nail polish.    Do not bring valuables to the hospital.  Contacts, dentures or bridgework may not be worn into surgery.  .   Patients discharged the day of surgery will not be allowed to drive home.     Colonoscopy, Adult, Care After This sheet gives you information about how to care for yourself after your procedure. Your health care provider may also give you more specific instructions. If you have problems or questions, contact your health care provider. What can I expect after the procedure? After the procedure, it is common to have:  A small amount of blood in your stool for 24 hours after the procedure.  Some gas.  Mild abdominal cramping or bloating.  Follow these instructions at home: General instructions   For the first 24 hours after the procedure: ? Do not drive or use machinery. ? Do not sign important documents. ? Do not drink alcohol. ? Do your regular daily activities at a slower pace than normal. ? Eat soft, easy-to-digest foods. ? Rest often.  Take over-the-counter or prescription medicines only as told by your health care provider.  It is up to you to get the results of your procedure. Ask your health care provider, or the department performing the procedure, when your results will be ready. Relieving cramping and bloating  Try walking around when you have cramps or feel bloated.  Apply heat to your abdomen as told by your health care provider. Use a heat source that your  health care provider recommends, such as a moist heat pack or a heating pad. ? Place a towel between your skin and the heat source. ? Leave the heat on for 20-30 minutes. ? Remove the heat if your skin turns bright red. This is especially important if you are unable to feel pain, heat, or cold. You may have a greater risk of getting burned. Eating and drinking  Drink enough fluid to keep your urine clear or pale yellow.  Resume your normal diet as instructed by your health care provider. Avoid heavy or fried foods that are hard to digest.  Avoid drinking alcohol for as long as instructed by your health care provider. Contact a health care provider if:  You have blood in your stool 2-3 days after the procedure. Get help right away if:  You have more than a small spotting of blood in your stool.  You pass large blood clots in your stool.  Your abdomen is swollen.  You have nausea or vomiting.  You have a fever.  You have increasing abdominal pain that is not relieved with medicine. This information is not intended to replace advice given to you by your health care provider. Make sure you discuss any questions you have with your health care provider. Document Released: 07/10/2004 Document Revised: 08/20/2016 Document Reviewed: 02/07/2016 Elsevier Interactive Patient Education  2018 [employer] 

## 2019-12-25 NOTE — Telephone Encounter (Signed)
Received called from pre service center. Patient has new insurance [employer]. They require PA for procedure.  Called [facility] and was advised PA is required. I was then transferred to the pre cert department. Clinicals provided. Will go into review and will receive decision via fax.

## 2019-12-29 ENCOUNTER — Other Ambulatory Visit (HOSPITAL_COMMUNITY)
Admission: RE | Admit: 2019-12-29 | Discharge: 2019-12-29 | Disposition: A | Discharge status: Home / Self Care | Payer: PRIVATE HEALTH INSURANCE | Source: Ambulatory Visit | Attending: Internal Medicine | Admitting: Internal Medicine

## 2019-12-29 ENCOUNTER — Other Ambulatory Visit: Payer: Self-pay

## 2019-12-29 ENCOUNTER — Encounter (HOSPITAL_COMMUNITY)
Admission: RE | Admit: 2019-12-29 | Discharge: 2019-12-29 | Disposition: A | Discharge status: Home / Self Care | Payer: PRIVATE HEALTH INSURANCE | Source: Ambulatory Visit | Attending: Internal Medicine | Admitting: Internal Medicine

## 2019-12-29 ENCOUNTER — Encounter (HOSPITAL_COMMUNITY): Payer: Self-pay

## 2019-12-29 NOTE — Telephone Encounter (Signed)
Pt called office, she wants to cancel TCS for now since case hasn't been approved. Informed pt we are unable to schedule screening procedures starting next week d/t COVID surge. Office will call her to reschedule procedure when we are allowed to schedule screening procedures.

## 2019-12-29 NOTE — Telephone Encounter (Signed)
Called [employer] and spoke with Heather C. Was advised they did receive medical records but since patient is having a screening TCS they do not consider this as "urgent" and the processing time is 15 business days. I advised patient was scheduled for Thursday and we were just informed of her new insurance. She states they are still not able to expedite this request.   I called patient and made her aware of the above. She is going to call me back and let me know what she decides to do. I did advise patient she would be responsible for the cost of procedure if this is not covered.

## 2019-12-29 NOTE — Telephone Encounter (Signed)
Called [facility] and PA has been cancelled for procedure

## 2019-12-31 ENCOUNTER — Ambulatory Visit (HOSPITAL_COMMUNITY)
Admission: RE | Admit: 2019-12-31 | Payer: PRIVATE HEALTH INSURANCE | Source: Home / Self Care | Admitting: Internal Medicine

## 2019-12-31 ENCOUNTER — Encounter (HOSPITAL_COMMUNITY): Admission: RE | Payer: Self-pay | Source: Home / Self Care

## 2019-12-31 SURGERY — COLONOSCOPY WITH PROPOFOL
Anesthesia: Monitor Anesthesia Care

## 2020-01-07 NOTE — Telephone Encounter (Signed)
Dr. Jena Gauss, pt has cancelled TCS w/Prop twice. Last office visit was 07/15/2019. Does she need OV to reschedule?

## 2020-01-07 NOTE — Telephone Encounter (Signed)
Tried to call from pt, no answer, LMOVM to inform pt OV is needed to reschedule TCS.

## 2020-01-07 NOTE — Telephone Encounter (Signed)
Yes

## 2020-02-24 ENCOUNTER — Ambulatory Visit: Payer: PRIVATE HEALTH INSURANCE | Admitting: Urology

## 2020-02-25 ENCOUNTER — Emergency Department (HOSPITAL_COMMUNITY): Payer: No Typology Code available for payment source

## 2020-02-25 ENCOUNTER — Inpatient Hospital Stay (HOSPITAL_COMMUNITY)
Admission: EM | Admit: 2020-02-25 | Discharge: 2020-02-26 | DRG: 660 | Disposition: A | Discharge status: Home / Self Care | Payer: No Typology Code available for payment source | Attending: Family Medicine | Admitting: Family Medicine

## 2020-02-25 ENCOUNTER — Inpatient Hospital Stay (HOSPITAL_COMMUNITY): Payer: No Typology Code available for payment source | Admitting: Anesthesiology

## 2020-02-25 ENCOUNTER — Other Ambulatory Visit: Payer: Self-pay

## 2020-02-25 ENCOUNTER — Encounter (HOSPITAL_COMMUNITY)
Admission: EM | Disposition: A | Discharge status: Home / Self Care | Payer: Self-pay | Source: Home / Self Care | Attending: Internal Medicine

## 2020-02-25 ENCOUNTER — Inpatient Hospital Stay (HOSPITAL_COMMUNITY): Payer: No Typology Code available for payment source

## 2020-02-25 ENCOUNTER — Encounter (HOSPITAL_COMMUNITY): Payer: Self-pay | Admitting: Internal Medicine

## 2020-02-25 DIAGNOSIS — Z79899 Other long term (current) drug therapy: Secondary | ICD-10-CM

## 2020-02-25 DIAGNOSIS — Z818 Family history of other mental and behavioral disorders: Secondary | ICD-10-CM

## 2020-02-25 DIAGNOSIS — R1084 Generalized abdominal pain: Secondary | ICD-10-CM | POA: Diagnosis present

## 2020-02-25 DIAGNOSIS — E876 Hypokalemia: Secondary | ICD-10-CM

## 2020-02-25 DIAGNOSIS — E1365 Other specified diabetes mellitus with hyperglycemia: Secondary | ICD-10-CM | POA: Diagnosis present

## 2020-02-25 DIAGNOSIS — Z9049 Acquired absence of other specified parts of digestive tract: Secondary | ICD-10-CM

## 2020-02-25 DIAGNOSIS — Z6841 Body Mass Index (BMI) 40.0 and over, adult: Secondary | ICD-10-CM | POA: Diagnosis not present

## 2020-02-25 DIAGNOSIS — F329 Major depressive disorder, single episode, unspecified: Secondary | ICD-10-CM | POA: Diagnosis present

## 2020-02-25 DIAGNOSIS — N132 Hydronephrosis with renal and ureteral calculous obstruction: Secondary | ICD-10-CM

## 2020-02-25 DIAGNOSIS — IMO0002 Reserved for concepts with insufficient information to code with codable children: Secondary | ICD-10-CM | POA: Diagnosis present

## 2020-02-25 DIAGNOSIS — M5136 Other intervertebral disc degeneration, lumbar region: Secondary | ICD-10-CM | POA: Diagnosis present

## 2020-02-25 DIAGNOSIS — E1165 Type 2 diabetes mellitus with hyperglycemia: Secondary | ICD-10-CM | POA: Diagnosis not present

## 2020-02-25 DIAGNOSIS — Z960 Presence of urogenital implants: Secondary | ICD-10-CM

## 2020-02-25 DIAGNOSIS — Z91128 Patient's intentional underdosing of medication regimen for other reason: Secondary | ICD-10-CM | POA: Diagnosis not present

## 2020-02-25 DIAGNOSIS — Z833 Family history of diabetes mellitus: Secondary | ICD-10-CM | POA: Diagnosis not present

## 2020-02-25 DIAGNOSIS — Z8249 Family history of ischemic heart disease and other diseases of the circulatory system: Secondary | ICD-10-CM

## 2020-02-25 DIAGNOSIS — N201 Calculus of ureter: Secondary | ICD-10-CM

## 2020-02-25 DIAGNOSIS — Z881 Allergy status to other antibiotic agents status: Secondary | ICD-10-CM

## 2020-02-25 DIAGNOSIS — Z87442 Personal history of urinary calculi: Secondary | ICD-10-CM | POA: Diagnosis not present

## 2020-02-25 DIAGNOSIS — K760 Fatty (change of) liver, not elsewhere classified: Secondary | ICD-10-CM | POA: Diagnosis present

## 2020-02-25 DIAGNOSIS — Z8616 Personal history of COVID-19: Secondary | ICD-10-CM

## 2020-02-25 DIAGNOSIS — Z880 Allergy status to penicillin: Secondary | ICD-10-CM | POA: Diagnosis not present

## 2020-02-25 DIAGNOSIS — E669 Obesity, unspecified: Secondary | ICD-10-CM | POA: Diagnosis present

## 2020-02-25 DIAGNOSIS — Z885 Allergy status to narcotic agent status: Secondary | ICD-10-CM

## 2020-02-25 DIAGNOSIS — E1340 Other specified diabetes mellitus with diabetic neuropathy, unspecified: Secondary | ICD-10-CM | POA: Diagnosis present

## 2020-02-25 DIAGNOSIS — G4733 Obstructive sleep apnea (adult) (pediatric): Secondary | ICD-10-CM | POA: Diagnosis present

## 2020-02-25 DIAGNOSIS — I10 Essential (primary) hypertension: Secondary | ICD-10-CM | POA: Diagnosis present

## 2020-02-25 DIAGNOSIS — R739 Hyperglycemia, unspecified: Secondary | ICD-10-CM | POA: Diagnosis not present

## 2020-02-25 DIAGNOSIS — Z794 Long term (current) use of insulin: Secondary | ICD-10-CM | POA: Diagnosis not present

## 2020-02-25 DIAGNOSIS — Z882 Allergy status to sulfonamides status: Secondary | ICD-10-CM

## 2020-02-25 DIAGNOSIS — Z808 Family history of malignant neoplasm of other organs or systems: Secondary | ICD-10-CM | POA: Diagnosis not present

## 2020-02-25 DIAGNOSIS — N200 Calculus of kidney: Secondary | ICD-10-CM

## 2020-02-25 DIAGNOSIS — T383X6A Underdosing of insulin and oral hypoglycemic [antidiabetic] drugs, initial encounter: Secondary | ICD-10-CM | POA: Diagnosis present

## 2020-02-25 DIAGNOSIS — Z6838 Body mass index (BMI) 38.0-38.9, adult: Secondary | ICD-10-CM | POA: Diagnosis present

## 2020-02-25 HISTORY — PX: CYSTOSCOPY WITH STENT PLACEMENT: SHX5790

## 2020-02-25 LAB — COMPREHENSIVE METABOLIC PANEL
ALT: 66 U/L — ABNORMAL HIGH (ref 0–44)
AST: 68 U/L — ABNORMAL HIGH (ref 15–41)
Albumin: 3.5 g/dL (ref 3.5–5.0)
Alkaline Phosphatase: 161 U/L — ABNORMAL HIGH (ref 38–126)
Anion gap: 18 — ABNORMAL HIGH (ref 5–15)
BUN: 8 mg/dL (ref 6–20)
CO2: 27 mmol/L (ref 22–32)
Calcium: 8.9 mg/dL (ref 8.9–10.3)
Chloride: 82 mmol/L — ABNORMAL LOW (ref 98–111)
Creatinine, Ser: 1.09 mg/dL — ABNORMAL HIGH (ref 0.44–1.00)
GFR calc Af Amer: >60 mL/min (ref >60)
GFR calc non Af Amer: 58 mL/min — ABNORMAL LOW (ref >60)
Glucose, Bld: 775 mg/dL (ref 70–99)
Potassium: 2.7 mmol/L — CL (ref 3.5–5.1)
Sodium: 127 mmol/L — ABNORMAL LOW (ref 135–145)
Total Bilirubin: 0.8 mg/dL (ref 0.3–1.2)
Total Protein: 7.4 g/dL (ref 6.5–8.1)

## 2020-02-25 LAB — CBC WITH DIFFERENTIAL/PLATELET
Abs Immature Granulocytes: 0.04 10*3/uL (ref 0.00–0.07)
Basophils Absolute: 0.1 10*3/uL (ref 0.0–0.1)
Basophils Relative: 1 %
Eosinophils Absolute: 0 10*3/uL (ref 0.0–0.5)
Eosinophils Relative: 0 %
HCT: 43.3 % (ref 36.0–46.0)
Hemoglobin: 14.4 g/dL (ref 12.0–15.0)
Immature Granulocytes: 0 %
Lymphocytes Relative: 15 %
Lymphs Abs: 2.1 10*3/uL (ref 0.7–4.0)
MCH: 27.9 pg (ref 26.0–34.0)
MCHC: 33.3 g/dL (ref 30.0–36.0)
MCV: 83.9 fL (ref 80.0–100.0)
Monocytes Absolute: 0.7 10*3/uL (ref 0.1–1.0)
Monocytes Relative: 5 %
Neutro Abs: 10.8 10*3/uL — ABNORMAL HIGH (ref 1.7–7.7)
Neutrophils Relative %: 79 %
Platelets: 380 10*3/uL (ref 150–400)
RBC: 5.16 MIL/uL — ABNORMAL HIGH (ref 3.87–5.11)
RDW: 13.3 % (ref 11.5–15.5)
WBC: 13.7 10*3/uL — ABNORMAL HIGH (ref 4.0–10.5)
nRBC: 0 % (ref 0.0–0.2)

## 2020-02-25 LAB — I-STAT BETA HCG BLOOD, ED (MC, WL, AP ONLY): I-stat hCG, quantitative: 6.7 m[IU]/mL — ABNORMAL HIGH (ref <5)

## 2020-02-25 LAB — CBG MONITORING, ED
Glucose-Capillary: >600 mg/dL (ref 70–99)
Glucose-Capillary: 543 mg/dL (ref 70–99)
Glucose-Capillary: 474 mg/dL — ABNORMAL HIGH (ref 70–99)
Glucose-Capillary: 321 mg/dL — ABNORMAL HIGH (ref 70–99)
Glucose-Capillary: 421 mg/dL — ABNORMAL HIGH (ref 70–99)
Glucose-Capillary: 190 mg/dL — ABNORMAL HIGH (ref 70–99)
Glucose-Capillary: 177 mg/dL — ABNORMAL HIGH (ref 70–99)

## 2020-02-25 LAB — URINALYSIS, ROUTINE W REFLEX MICROSCOPIC
Bacteria, UA: NONE SEEN
Bilirubin Urine: NEGATIVE
Glucose, UA: >=500 mg/dL — AB
Ketones, ur: NEGATIVE mg/dL
Leukocytes,Ua: NEGATIVE
Nitrite: NEGATIVE
Protein, ur: NEGATIVE mg/dL
Specific Gravity, Urine: 1.023 (ref 1.005–1.030)
pH: 7 (ref 5.0–8.0)

## 2020-02-25 LAB — LACTIC ACID, PLASMA
Lactic Acid, Venous: 4 mmol/L (ref 0.5–1.9)
Lactic Acid, Venous: 4.2 mmol/L (ref 0.5–1.9)

## 2020-02-25 LAB — BLOOD GAS, VENOUS
Acid-Base Excess: 8.2 mmol/L — ABNORMAL HIGH (ref 0.0–2.0)
Bicarbonate: 30.4 mmol/L — ABNORMAL HIGH (ref 20.0–28.0)
FIO2: 21
O2 Saturation: 50.5 %
Patient temperature: 36.8
pCO2, Ven: 44.7 mmHg (ref 44.0–60.0)
pH, Ven: 7.471 — ABNORMAL HIGH (ref 7.250–7.430)
pO2, Ven: <31 mmHg — CL (ref 32.0–45.0)

## 2020-02-25 LAB — GLUCOSE, CAPILLARY
Glucose-Capillary: 214 mg/dL — ABNORMAL HIGH (ref 70–99)
Glucose-Capillary: 296 mg/dL — ABNORMAL HIGH (ref 70–99)

## 2020-02-25 LAB — LIPASE, BLOOD: Lipase: 40 U/L (ref 11–51)

## 2020-02-25 LAB — BETA-HYDROXYBUTYRIC ACID: Beta-Hydroxybutyric Acid: 0.26 mmol/L (ref 0.05–0.27)

## 2020-02-25 LAB — RESPIRATORY PANEL BY RT PCR (FLU A&B, COVID)
Influenza A by PCR: NEGATIVE
Influenza B by PCR: NEGATIVE
SARS Coronavirus 2 by RT PCR: NEGATIVE

## 2020-02-25 LAB — BASIC METABOLIC PANEL
Anion gap: 13 (ref 5–15)
BUN: 9 mg/dL (ref 6–20)
CO2: 28 mmol/L (ref 22–32)
Calcium: 8.4 mg/dL — ABNORMAL LOW (ref 8.9–10.3)
Chloride: 90 mmol/L — ABNORMAL LOW (ref 98–111)
Creatinine, Ser: 0.91 mg/dL (ref 0.44–1.00)
GFR calc Af Amer: >60 mL/min (ref >60)
GFR calc non Af Amer: >60 mL/min (ref >60)
Glucose, Bld: 566 mg/dL (ref 70–99)
Potassium: 3.1 mmol/L — ABNORMAL LOW (ref 3.5–5.1)
Sodium: 131 mmol/L — ABNORMAL LOW (ref 135–145)

## 2020-02-25 LAB — PREGNANCY, URINE: Preg Test, Ur: NEGATIVE

## 2020-02-25 SURGERY — CYSTOSCOPY, WITH STENT INSERTION
Anesthesia: General | Laterality: Right

## 2020-02-25 MED ORDER — DIATRIZOATE MEGLUMINE 30 % UR SOLN
URETHRAL | Status: AC
  Filled 2020-02-25: qty 100

## 2020-02-25 MED ORDER — CEFAZOLIN (ANCEF) 1 G IV SOLR
2.0000 g | Freq: Once | INTRAVENOUS | Status: AC
  Administered 2020-02-25: 2 g
  Filled 2020-02-25: qty 2

## 2020-02-25 MED ORDER — ONDANSETRON HCL 4 MG/2ML IJ SOLN: 4.0000 mg | Freq: Once | INTRAMUSCULAR | Status: DC | PRN

## 2020-02-25 MED ORDER — DEXTROSE-NACL 5-0.45 % IV SOLN: INTRAVENOUS | Status: DC

## 2020-02-25 MED ORDER — SUCCINYLCHOLINE CHLORIDE 200 MG/10ML IV SOSY: PREFILLED_SYRINGE | INTRAVENOUS | Status: DC | PRN

## 2020-02-25 MED ORDER — ONDANSETRON HCL 4 MG/2ML IJ SOLN
4.0000 mg | Freq: Once | INTRAMUSCULAR | Status: AC
  Administered 2020-02-25: 4 mg via INTRAVENOUS
  Filled 2020-02-25: qty 2

## 2020-02-25 MED ORDER — STERILE WATER FOR IRRIGATION IR SOLN
Status: DC | PRN
  Administered 2020-02-25: 1000 mL

## 2020-02-25 MED ORDER — CEFAZOLIN SODIUM-DEXTROSE 2-4 GM/100ML-% IV SOLN
INTRAVENOUS | Status: AC
  Filled 2020-02-25: qty 100

## 2020-02-25 MED ORDER — FENTANYL CITRATE (PF) 100 MCG/2ML IJ SOLN
INTRAMUSCULAR | Status: AC
  Filled 2020-02-25: qty 4

## 2020-02-25 MED ORDER — LIDOCAINE HCL (CARDIAC) PF 100 MG/5ML IV SOSY
PREFILLED_SYRINGE | INTRAVENOUS | Status: DC | PRN
  Administered 2020-02-25: 100 mg via INTRAVENOUS

## 2020-02-25 MED ORDER — SUCCINYLCHOLINE CHLORIDE 200 MG/10ML IV SOSY
PREFILLED_SYRINGE | INTRAVENOUS | Status: AC
  Filled 2020-02-25: qty 10

## 2020-02-25 MED ORDER — DEXTROSE 50 % IV SOLN: 0.0000 mL | INTRAVENOUS | Status: DC | PRN

## 2020-02-25 MED ORDER — LACTATED RINGERS IV SOLN: INTRAVENOUS | Status: DC | PRN

## 2020-02-25 MED ORDER — METOCLOPRAMIDE HCL 5 MG/ML IJ SOLN
INTRAMUSCULAR | Status: AC
  Filled 2020-02-25: qty 2

## 2020-02-25 MED ORDER — 0.9 % SODIUM CHLORIDE (POUR BTL) OPTIME
TOPICAL | Status: DC | PRN
  Administered 2020-02-25: 1000 mL

## 2020-02-25 MED ORDER — SODIUM CHLORIDE 0.9 % IV BOLUS
500.0000 mL | Freq: Once | INTRAVENOUS | Status: AC
  Administered 2020-02-25: 500 mL via INTRAVENOUS

## 2020-02-25 MED ORDER — ACETAMINOPHEN 325 MG PO TABS: 650.0000 mg | ORAL_TABLET | Freq: Four times a day (QID) | ORAL | Status: DC | PRN

## 2020-02-25 MED ORDER — SODIUM CHLORIDE 0.9 % IV SOLN: INTRAVENOUS | Status: DC

## 2020-02-25 MED ORDER — ACETAMINOPHEN 650 MG RE SUPP: 650.0000 mg | Freq: Four times a day (QID) | RECTAL | Status: DC | PRN

## 2020-02-25 MED ORDER — POTASSIUM CHLORIDE 10 MEQ/100ML IV SOLN
10.0000 meq | INTRAVENOUS | Status: AC
  Administered 2020-02-25 (×3): 10 meq via INTRAVENOUS
  Filled 2020-02-25 (×3): qty 100

## 2020-02-25 MED ORDER — ONDANSETRON HCL 4 MG PO TABS: 4.0000 mg | ORAL_TABLET | Freq: Four times a day (QID) | ORAL | Status: DC | PRN

## 2020-02-25 MED ORDER — PROPOFOL 10 MG/ML IV BOLUS
INTRAVENOUS | Status: DC | PRN
  Administered 2020-02-25: 200 mg via INTRAVENOUS

## 2020-02-25 MED ORDER — SUCCINYLCHOLINE CHLORIDE 200 MG/10ML IV SOSY
PREFILLED_SYRINGE | INTRAVENOUS | Status: DC | PRN
  Administered 2020-02-25: 120 mg via INTRAVENOUS

## 2020-02-25 MED ORDER — LIDOCAINE 2% (20 MG/ML) 5 ML SYRINGE
INTRAMUSCULAR | Status: AC
  Filled 2020-02-25: qty 5

## 2020-02-25 MED ORDER — PROPOFOL 10 MG/ML IV BOLUS
INTRAVENOUS | Status: AC
  Filled 2020-02-25: qty 40

## 2020-02-25 MED ORDER — HYDROMORPHONE HCL 1 MG/ML IJ SOLN
INTRAMUSCULAR | Status: AC
  Filled 2020-02-25: qty 0.5

## 2020-02-25 MED ORDER — FENTANYL CITRATE (PF) 100 MCG/2ML IJ SOLN
INTRAMUSCULAR | Status: DC | PRN
  Administered 2020-02-25: 100 ug via INTRAVENOUS

## 2020-02-25 MED ORDER — INSULIN REGULAR(HUMAN) IN NACL 100-0.9 UT/100ML-% IV SOLN
INTRAVENOUS | Status: DC
  Administered 2020-02-25: 10.5 [IU]/h via INTRAVENOUS
  Filled 2020-02-25: qty 100

## 2020-02-25 MED ORDER — LACTATED RINGERS IV SOLN: Freq: Once | INTRAVENOUS | Status: DC

## 2020-02-25 MED ORDER — SODIUM CHLORIDE 0.9 % IV SOLN
1.0000 g | Freq: Once | INTRAVENOUS | Status: AC
  Administered 2020-02-25: 1 g via INTRAVENOUS
  Filled 2020-02-25: qty 10

## 2020-02-25 MED ORDER — MIDAZOLAM HCL 2 MG/2ML IJ SOLN
INTRAMUSCULAR | Status: AC
  Filled 2020-02-25: qty 2

## 2020-02-25 MED ORDER — METOCLOPRAMIDE HCL 5 MG/ML IJ SOLN
INTRAMUSCULAR | Status: DC | PRN
  Administered 2020-02-25: 10 mg via INTRAVENOUS

## 2020-02-25 MED ORDER — LORAZEPAM 1 MG PO TABS
1.0000 mg | ORAL_TABLET | Freq: Once | ORAL | Status: AC
  Administered 2020-02-25: 1 mg via ORAL
  Filled 2020-02-25: qty 1

## 2020-02-25 MED ORDER — ONDANSETRON HCL 4 MG/2ML IJ SOLN: 4.0000 mg | Freq: Four times a day (QID) | INTRAMUSCULAR | Status: DC | PRN

## 2020-02-25 MED ORDER — MIDAZOLAM HCL 5 MG/5ML IJ SOLN
INTRAMUSCULAR | Status: DC | PRN
  Administered 2020-02-25: 2 mg via INTRAVENOUS

## 2020-02-25 MED ORDER — DIATRIZOATE MEGLUMINE 30 % UR SOLN
URETHRAL | Status: DC | PRN
  Administered 2020-02-25: 10 mL via URETHRAL

## 2020-02-25 MED ORDER — MEPERIDINE HCL 50 MG/ML IJ SOLN: 6.2500 mg | INTRAMUSCULAR | Status: DC | PRN

## 2020-02-25 MED ORDER — IOHEXOL 300 MG/ML  SOLN
100.0000 mL | Freq: Once | INTRAMUSCULAR | Status: AC | PRN
  Administered 2020-02-25: 100 mL via INTRAVENOUS

## 2020-02-25 MED ORDER — SODIUM CHLORIDE 0.9 % IV BOLUS
1000.0000 mL | Freq: Once | INTRAVENOUS | Status: AC
  Administered 2020-02-25: 1000 mL via INTRAVENOUS

## 2020-02-25 MED ORDER — INSULIN ASPART 100 UNIT/ML ~~LOC~~ SOLN
0.0000 [IU] | SUBCUTANEOUS | Status: DC
  Administered 2020-02-25: 8 [IU] via SUBCUTANEOUS

## 2020-02-25 MED ORDER — POTASSIUM CHLORIDE 10 MEQ/100ML IV SOLN
10.0000 meq | INTRAVENOUS | Status: AC
  Administered 2020-02-25: 10 meq via INTRAVENOUS
  Filled 2020-02-25: qty 100

## 2020-02-25 MED ORDER — FENTANYL CITRATE (PF) 100 MCG/2ML IJ SOLN
50.0000 ug | INTRAMUSCULAR | Status: DC | PRN
  Administered 2020-02-25 – 2020-02-26 (×2): 50 ug via INTRAVENOUS
  Filled 2020-02-25 (×2): qty 2

## 2020-02-25 MED ORDER — INSULIN ASPART 100 UNIT/ML ~~LOC~~ SOLN
0.0000 [IU] | SUBCUTANEOUS | Status: DC
  Administered 2020-02-26: 8 [IU] via SUBCUTANEOUS

## 2020-02-25 MED ORDER — HYDROMORPHONE HCL 1 MG/ML IJ SOLN
0.2500 mg | INTRAMUSCULAR | Status: DC | PRN
  Administered 2020-02-25: 0.5 mg via INTRAVENOUS

## 2020-02-25 MED ORDER — SODIUM CHLORIDE 0.9 % IV SOLN: 1.0000 g | INTRAVENOUS | Status: DC

## 2020-02-25 MED ORDER — SODIUM CHLORIDE 0.9 % IR SOLN
Status: DC | PRN
  Administered 2020-02-25: 1000 mL

## 2020-02-25 SURGICAL SUPPLY — 24 items
BAG DRAIN URO TABLE W/ADPT NS (BAG) ×1 IMPLANT
BAG DRN 8 ADPR NS SKTRN CSTL (BAG) ×1
BAG HAMPER (MISCELLANEOUS) ×1 IMPLANT
CATH FOLEY 2WAY SLVR  5CC 16FR (CATHETERS) ×2
CATH FOLEY 2WAY SLVR 5CC 16FR (CATHETERS) IMPLANT
CATH URET 5FR 28IN OPEN ENDED (CATHETERS) ×2 IMPLANT
CLOTH BEACON ORANGE TIMEOUT ST (SAFETY) ×1 IMPLANT
GLOVE BIO SURGEON STRL SZ7.5 (GLOVE) ×1 IMPLANT
GLOVE BIOGEL PI IND STRL 6.5 (GLOVE) IMPLANT
GLOVE BIOGEL PI IND STRL 7.0 (GLOVE) IMPLANT
GLOVE BIOGEL PI INDICATOR 6.5 (GLOVE) ×2
GLOVE BIOGEL PI INDICATOR 7.0 (GLOVE) ×2
GOWN STRL REUS W/ TWL XL LVL3 (GOWN DISPOSABLE) IMPLANT
GOWN STRL REUS W/TWL XL LVL3 (GOWN DISPOSABLE) ×4
GUIDEWIRE STR DUAL SENSOR (WIRE) ×1 IMPLANT
GUIDEWIRE STR ZIPWIRE 035X150 (MISCELLANEOUS) ×1 IMPLANT
IV NS 1000ML (IV SOLUTION)
IV NS 1000ML BAXH (IV SOLUTION) IMPLANT
IV NS IRRIG 3000ML ARTHROMATIC (IV SOLUTION) ×1 IMPLANT
KIT TURNOVER CYSTO (KITS) ×1 IMPLANT
MANIFOLD NEPTUNE II (INSTRUMENTS) ×1 IMPLANT
PACK CYSTO (CUSTOM PROCEDURE TRAY) ×1 IMPLANT
TOWEL OR 17X24 6PK STRL BLUE (TOWEL DISPOSABLE) ×1 IMPLANT
WATER STERILE IRR 500ML POUR (IV SOLUTION) ×1 IMPLANT

## 2020-02-25 NOTE — ED Triage Notes (Signed)
Pt saw PCP yesterday for abdominal pain. Was told to come to ED for eval.  Blood work was abnormal

## 2020-02-25 NOTE — Op Note (Signed)
Preoperative diagnosis: Right ureteral stone, right renal stone, right hydronephrosis  Postoperative diagnosis: Same  Procedure: Cystoscopy with right retrograde pyelogram and right ureteral stent placement  Surgeon: Mena Goes  Anesthesia: General  Indication for procedure: [name] is a 54 year old female with a 72-month history of abdominal pain and weight loss.  CT scan revealed moderate to severe right hydronephrosis down to a 6 mm stone.  She also had an 11 mm stone in the right kidney and a 12 mm stone in the left kidney.  No left hydronephrosis.  She was suffering from hyperglycemia episode and hypokalemia.  Findings: On cystoscopy the urethra and bladder were unremarkable.  No stone or foreign body in the bladder.  Right retrograde pyelogram-this outlined a filling defect in the distal ureter with severe hydroureteronephrosis proximally.  Description of procedure: After consent was obtained the patient was brought to the operating room and placed in lithotomy position after adequate anesthesia.  Timeout was performed to confirm the patient and procedure.  Cystoscope was passed per urethra and a 5 [place] open-ended catheter was used to cannulate the right ureteral orifice.  Retrograde injection of contrast was performed.  I then passed a zip wire and coiled out in the upper pole calyx.  A 6 x 24 cm stent was advanced.  The wire was removed with a good coil seen in the upper pole calyx and a good coil in the bladder.  The scope was removed and a 16 French Foley placed to max drain the urine overnight.  She was awakened taken recovery room in stable condition.  Complications: None  Blood loss: Minimal  Specimens: None  Drains: 6 x 24 cm right ureteral stent  Disposition: Patient stable to PACU.  Discussed with patient preop and with husband postop importance of follow-up to plan stent/stone removal.

## 2020-02-25 NOTE — H&P (Addendum)
History and Physical    [name] URK:[id number] DOB: 1966/08/02 DOA: 02/25/2020  PCP: Benita Stabile, MD   Patient coming from: Home  I have personally briefly reviewed patient's old medical records in [facility]  Chief Complaint: Home  HPI: [name] is a 54 y.o. female with medical history significant for type I diabetes mellitus, hypertension, depression, obesity.  Patient presented to the ED with complaints of polyuria, polydipsia over the past few days.  She also has been having generalized abdominal pain, intermittent over the past several weeks.  She has a history of kidney stones but did not think this pain was similar to that. She also reports she was diagnosed with COVID-29 August 2019.  She did not require hospitalization.  She reports since then she has not been back to her normal self.  She denies pain/burning with urination, but reports occasional urinary incontinence that is not new.  She reports chills, but no fevers.  Patient is on an insulin pump for her type 1 diabetes.  The last time she took any insulin or checked her blood sugars was 2 days ago.  She reports her blood sugars have never been this high.  Patient also reports ~30lb unintentional weight loss in the past 3 months.  She reports appetite has been poor.  She has kept up with all her screenings including mammograms, Pap smears.  She has a colonoscopy planned.  Never smoked cigarettes.  History of "throat cancer" in uncle and "other cancer" in maternal grandmother.  Patient saw her primary care provider yesterday had blood work done and was called today to come to the ED, due to abnormal lab.  But she is unaware of exactly what was abnormal.  ED Course: Temperature 98.3, stable vitals.  Hyperglycemia glucose 775, with mildly increased anion gap of 18, normal serum bicarb of 27.  Normal beta butyrate acid.  pH of 7.4.  Lactic acid elevated 4 > 4.2.  WBC 13.7.  Mild elevation in LFTs-AST 68, ALT 66, ALP  161.  Abdominal CT with contrast shows a 6 mm stone in distal right ureter causing moderate to marked right hydroureteronephrosis.  IV ceftriaxone 1 g given. EDP talked to Dr. Mena Goes, plans for stenting.  Review of Systems: As per HPI all other systems reviewed and negative.  Past Medical History:  Diagnosis Date  . Diabetes mellitus    insulin pump 2013  . Hypercholesteremia   . Hypertension   . Kidney stone   . Neuropathy   . Obesity   . Obstructive sleep apnea   . Palpitations   . Shortness of breath     Past Surgical History:  Procedure Laterality Date  . CARDIAC CATHETERIZATION  12/11/2012   normal coronary arteries  . CESAREAN SECTION  1994;2000   [facility]  . CHOLECYSTECTOMY  1992  . CYSTOSCOPY W/ URETERAL STENT PLACEMENT  05/08/2012   Procedure: CYSTOSCOPY WITH RETROGRADE PYELOGRAM/URETERAL STENT PLACEMENT;  Surgeon: Ky Barban, MD;  Location: [facility];  Service: Urology;  Laterality: Right;  . FOOT SURGERY Right March, 2013   [facility]-removal of bone spur  . HYSTEROSCOPY WITH THERMACHOICE  04/25/2012   Procedure: HYSTEROSCOPY WITH THERMACHOICE;  Surgeon: Lazaro Arms, MD;  Location: [facility];  Service: Gynecology;  Laterality: N/A;  total therapy time= 11 minutes 42 seconds; 34 ml D5w  in and 34 ml D5w out; temp =87 degrees F  . LEFT HEART CATHETERIZATION WITH CORONARY ANGIOGRAM N/A 12/11/2012   Procedure: LEFT  HEART CATHETERIZATION WITH CORONARY ANGIOGRAM;  Surgeon: Runell Gess, MD;  Location: [facility] CATH LAB;  Service: Cardiovascular;  Laterality: N/A;  . Sinus sergery  1988   [place]  . UMBILICAL HERNIA REPAIR N/A 03/25/2014   Procedure: UMBILICAL HERNIA REPAIR;  Surgeon: Marlane Hatcher, MD;  Location: [facility];  Service: General;  Laterality: N/A;  . uterine ablation       reports that she has never smoked. She has never used smokeless tobacco. She reports that she does not drink alcohol or use drugs.  Allergies  Allergen Reactions  . Ciprofloxacin  Hives and Swelling  . Penicillins Anaphylaxis and Rash  . Codeine Other (See Comments)    REACTION:  had hallicunations  . Morphine Nausea And Vomiting and Other (See Comments)    REACTION:   recieved in ED due to [facility] and had multple doses - gave visual hallucinations and vomitting.  . Sulfonamide Derivatives Other (See Comments)    REACTION: Unsure - childhood allergy  . Vancomycin Itching    Family History  Problem Relation Age of Onset  . Diabetes Mother   . Depression Paternal Grandmother   . Depression Paternal Grandfather   . Heart disease Other   . Cancer Other   . Diabetes Other   . Achalasia Other   . Anesthesia problems Neg Hx   . Hypotension Neg Hx   . Malignant hyperthermia Neg Hx   . Pseudochol deficiency Neg Hx     Prior to Admission medications   Medication Sig Start Date End Date Taking? Authorizing Provider  albuterol (VENTOLIN HFA) 108 (90 Base) MCG/ACT inhaler Inhale 2 puffs into the lungs every 6 (six) hours as needed for wheezing or shortness of breath.  08/27/19  Yes [provider]  ALPRAZolam Prudy Feeler) 1 MG tablet Take 1 mg by mouth at bedtime.  02/04/18  Yes [provider]  ARIPiprazole (ABILIFY) 2 MG tablet Take 2 mg by mouth at bedtime.  04/13/19  Yes [provider]  buPROPion (WELLBUTRIN XL) 150 MG 24 hr tablet Take 150 mg by mouth daily.  12/29/17  Yes [provider]  DULoxetine (CYMBALTA) 60 MG capsule Take 60 mg by mouth 2 (two) times daily.  02/17/18  Yes [provider]  escitalopram (LEXAPRO) 20 MG tablet Take 40 mg by mouth 2 (two) times daily.  02/07/18  Yes [provider]  furosemide (LASIX) 20 MG tablet Take 40 mg by mouth daily. 12/08/19  Yes [provider]  gabapentin (NEURONTIN) 600 MG tablet Take 600 mg by mouth 3 (three) times daily.    Yes [provider]  HUMALOG 100 UNIT/ML injection Inject into the skin continuous. 3.5 unirs every 1 hr-insulin pump 01/06/15   Yes [provider]  LORazepam (ATIVAN) 0.5 MG tablet Take 0.5 mg by mouth daily as needed for anxiety.  01/29/18  Yes [provider]  Multiple Vitamin (MULTIVITAMIN WITH MINERALS) TABS tablet Take 1 tablet by mouth daily.   Yes [provider]  omeprazole (PRILOSEC) 40 MG capsule Take 40 mg by mouth daily. 02/24/20  Yes [provider]  oxybutynin (DITROPAN-XL) 5 MG 24 hr tablet Take 5 mg by mouth daily. 12/15/19  Yes [provider]  simvastatin (ZOCOR) 40 MG tablet Take 40 mg by mouth daily.  03/29/19  Yes [provider]    Physical Exam: Vitals:   02/25/20 1301 02/25/20 1400 02/25/20 1530 02/25/20 1600  BP:  (!) 141/73 (!) 146/71 (!) 145/94  Pulse:  82 79 86  Resp:   20 (!) 22  Temp:      TempSrc:      SpO2:  100% 100% 99%  Weight: 92.5 kg     Height: 4\' 8"  (1.422 m)       Constitutional: NAD, calm, comfortable Vitals:   02/25/20 1301 02/25/20 1400 02/25/20 1530 02/25/20 1600  BP:  (!) 141/73 (!) 146/71 (!) 145/94  Pulse:  82 79 86  Resp:   20 (!) 22  Temp:      TempSrc:      SpO2:  100% 100% 99%  Weight: 92.5 kg     Height: 4\' 8"  (1.422 m)      Eyes: PERRL, lids and conjunctivae normal ENMT: Mucous membranes are moist. Neck: normal, supple, no masses, no thyromegaly Respiratory: Normal respiratory effort. No accessory muscle use.  Cardiovascular: Regular rate and rhythm. No extremity edema. 2+ pedal pulses.   Abdomen: Soft without guarding, no tenderness appreciated, no masses palpated. No hepatosplenomegaly. Bowel sounds positive.  Musculoskeletal: no clubbing / cyanosis. No joint deformity upper and lower extremities. Good ROM, no contractures.  Skin: no rashes, lesions, ulcers. No induration Neurologic: No apparent cranial nerve abnormality, moving all extremities spontaneously. Psychiatric: Normal judgment and insight. Alert and oriented x 3. Normal mood.   Labs on Admission: I have personally reviewed following  labs and imaging studies  CBC: Recent Labs  Lab 02/25/20 1325  WBC 13.7*  NEUTROABS 10.8*  HGB 14.4  HCT 43.3  MCV 83.9  PLT 725   Basic Metabolic Panel: Recent Labs  Lab 02/25/20 1325 02/25/20 1519  NA 127* 131*  K 2.7* 3.1*  CL 82* 90*  CO2 27 28  GLUCOSE 775* 566*  BUN 8 9  CREATININE 1.09* 0.91  CALCIUM 8.9 8.4*   Liver Function Tests: Recent Labs  Lab 02/25/20 1325  AST 68*  ALT 66*  ALKPHOS 161*  BILITOT 0.8  PROT 7.4  ALBUMIN 3.5   Recent Labs  Lab 02/25/20 1325  LIPASE 40   CBG: Recent Labs  Lab 02/25/20 1342 02/25/20 1507 02/25/20 1552 02/25/20 1632 02/25/20 1728  GLUCAP >600* 543* 474* 421* 321*   Urine analysis:    Component Value Date/Time   COLORURINE YELLOW 02/25/2020 1327   APPEARANCEUR CLEAR 02/25/2020 1327   LABSPEC 1.023 02/25/2020 1327   PHURINE 7.0 02/25/2020 1327   GLUCOSEU >=500 (A) 02/25/2020 1327   HGBUR MODERATE (A) 02/25/2020 1327   HGBUR moderate 08/31/2008 0908   BILIRUBINUR NEGATIVE 02/25/2020 [place] 02/25/2020 1327   PROTEINUR NEGATIVE 02/25/2020 1327   UROBILINOGEN 0.2 09/21/2014 1055   NITRITE NEGATIVE 02/25/2020 1327   LEUKOCYTESUR NEGATIVE 02/25/2020 1327    Radiological Exams on Admission: CT ABDOMEN PELVIS W CONTRAST  Result Date: 02/25/2020 CLINICAL DATA:  Unintentional 35 pound weight loss since 08/2019, patient diagnosed with COVID-19 in September 2020. Abdominal distension. EXAM: CT ABDOMEN AND PELVIS WITH CONTRAST TECHNIQUE: Multidetector CT imaging of the abdomen and pelvis was performed using the standard protocol following bolus administration of intravenous contrast. CONTRAST:  164mL OMNIPAQUE IOHEXOL 300 MG/ML  SOLN COMPARISON:  09/21/2014 FINDINGS: Lower chest: Clear lung bases.  Heart normal in size. Hepatobiliary: Liver mildly enlarged, 25 cm transversely. Diffusely decreased attenuation of the liver is noted consistent with fatty infiltration. No mass or focal lesion. Status  post cholecystectomy. No bile duct dilation Pancreas: Unremarkable. No pancreatic ductal dilatation or surrounding inflammatory changes. Spleen: Normal in size without focal abnormality. Adrenals/Urinary Tract: 7  mm left adrenal nodule, stable consistent with an adenoma. Normal right adrenal gland. Moderate to marked right hydronephrosis and hydroureter. This is due to a 6 mm stone in the distal ureter. Mild right renal cortical thinning. There are adjacent stones in the lower pole the right kidney with a combined greatest dimension of 11 mm. Nonobstructing stones are noted in the upper lower pole of the left kidney. No renal masses. Symmetric renal enhancement and excretion. No left hydronephrosis. Normal left ureter. Normal bladder. Stomach/Bowel: Stomach is within normal limits. Appendix appears normal. No evidence of bowel wall thickening, distention, or inflammatory changes. No bowel mass visualized. Vascular/Lymphatic: Minimal aortic atherosclerosis. No aneurysm. No enlarged lymph nodes. Reproductive: Uterus and bilateral adnexa are unremarkable. Other: No abdominal wall hernia or abnormality. No abdominopelvic ascites. Musculoskeletal: Chronic bilateral pars defects at L5-S1 with a minor anterolisthesis. No acute fracture. No osteoblastic or osteolytic lesions. IMPRESSION: 1. 6 mm stone in the distal right ureter causes moderate to marked right hydroureteronephrosis. 2. No other acute abnormality. Bilateral nonobstructing intrarenal stones. 3. Mild hepatomegaly and diffuse hepatic steatosis. Electronically Signed   By: Amie Portland M.D.   On: 02/25/2020 16:55    EKG: None  Assessment/Plan Active Problems:   Hyperglycemia   Hyperglycemia-not in DKA.  Glucose 775, normal serum bicarb 27, initial anion gap 18 >> 13.  Patient has not had any insulin or checked her blood sugar in the last/at least 2 days.  Forgot her insulin pump at home. Reports her basal insulin is 2.5u/hr. -Continue insulin GTT -BMP  a.m. -2 L bolus given, add 1 more liter and continue maintenance fluids normal saline at 100 cc/h -Replete potassium - When CBG < 250, will continue with d5 1/2 N/s till after procedure. Resume S/c insulin when patient is able to take PO.  - patient encouraged to bring her insulin pump to the hospital  Renal stone with right hydroureteric nephrosis- 6 mm stone in the distal right ureter causes moderate to marked right hydroureteronephrosis.  Mild leukocytosis 13.7, but UA not suggestive of infection, afebrile with mild leukocytosis of 13.7.  IV ceftriaxone 1 g x 1 given in ED. -Plan stenting tonight at 8 PM -Remain n.p.o -hold pharmacologic DVT prophylaxis   Lactic acidosis- 4 > 4.2.  Reported polydipsia and polyuria.  Likely due to hyperglycemia and volume contraction.  No focus of infection identified at this time. -Hydrate -Trend lactic acid  Pseudohyponatremia-sodium 127, corrected sodium 138.  History of COVID-19 infection 08/2019 did not require hospitalization.  History of weight loss -Follow-up with primary care provider  Depression- - While NPO, Hold home abilify, bupropion, cymbalt and Xanax 1 mg every 12 hourly as needed, patient takes this consistently   DVT prophylaxis: SCDs Code Status: Full code Family Communication: Spouse at bedside.  Plan of care explained, all questions answered. Disposition Plan: 1 - 2 days.  Pending stent placement, improvement in hyperglycemia. Consults called: Urology Dr. Mena Goes Admission status: Inpatient, stepdown I certify that at the point of admission it is my clinical judgment that the patient will require inpatient hospital care spanning beyond 2 midnights from the point of admission due to high intensity of service, high risk for further deterioration and high frequency of surveillance required. The following factors support the patient status of inpatient: Requiring insulin drip and hence stepdown level of care, inpatient surgical  procedure.   Onnie Boer MD [facility]  02/25/2020, 6:55 PM

## 2020-02-25 NOTE — ED Notes (Signed)
Orders entered for 4 runs of Potassium and insulin drip to be started at this time. Potassium 2.7 currently. Verified order with Harlene Salts, PA as to whether he wanted the insulin hung simultaneously with the potassium as order is written due to the potassium level being lower than 3.0. PA informed RN that it was fine to go ahead and give together but then gave verbal order to hang the Potassium run now and wait about 30 minutes and then hang the insulin.

## 2020-02-25 NOTE — ED Provider Notes (Signed)
[facility] EMERGENCY DEPARTMENT Provider Note   CSN: [id number] Arrival date & time: 02/25/20  1236     History Chief Complaint  Patient presents with  . Abnormal Lab    [name] is a 54 y.o. female history of diabetes, hypertension, high cholesterol, obesity, kidney stones, sleep apnea, neuropathy.  Patient presents today for multiple concerns.  Patient reports that since she was diagnosed with COVID-19 in September 2020 she has not "felt right".  She reports that she has had daily abdominal pain and a generalized cramping sensation constant nonradiating no clear aggravating or alleviating factors, occasionally associated with mild nonbloody diarrhea.  She reports loss of appetite and has reportedly lost around 28 pounds this year.  She reports that she went to her primary care doctor yesterday and had blood work performed, received phone call today informing her to come to the ED for further evaluation, she is unsure what labs may have been abnormal.  Denies recent fever, denies chills, headache, neck pain, chest pain/shortness of breath, dysuria/hematuria, numbness/tingling, weakness or any additional concerns.  HPI     Past Medical History:  Diagnosis Date  . Diabetes mellitus    insulin pump 2013  . Hypercholesteremia   . Hypertension   . Kidney stone   . Neuropathy   . Obesity   . Obstructive sleep apnea   . Palpitations   . Shortness of breath     Patient Active Problem List   Diagnosis Date Noted  . Hyperglycemia 02/25/2020  . Preventative health care 07/15/2019  . Other intervertebral disc degeneration, lumbar region 05/24/2019  . Palpitations 12/08/2014  . Chest pain 12/08/2014  . Obstructive sleep apnea 12/08/2014  . Incarcerated umbilical hernia 03/25/2014  . Cellulitis 02/19/2014  . Pelvic pain 07/15/2013  . Depression 07/15/2013  . Decreased libido 12/23/2012  . Dyspnea 11/12/2012  . Headache(784.0) 10/14/2012  . Neuropathy 10/14/2012  .  Candidal intertrigo 09/12/2012  . Diabetes mellitus type II, uncontrolled (HCC) 05/10/2012  . Hypertension 05/10/2012  . HYPERLIPIDEMIA 09/29/2008  . OBESITY 08/31/2008  . NEPHROLITHIASIS 08/31/2008    Past Surgical History:  Procedure Laterality Date  . CARDIAC CATHETERIZATION  12/11/2012   normal coronary arteries  . CESAREAN SECTION  1994;2000   [facility]  . CHOLECYSTECTOMY  1992  . CYSTOSCOPY W/ URETERAL STENT PLACEMENT  05/08/2012   Procedure: CYSTOSCOPY WITH RETROGRADE PYELOGRAM/URETERAL STENT PLACEMENT;  Surgeon: Ky Barban, MD;  Location: [facility];  Service: Urology;  Laterality: Right;  . FOOT SURGERY Right March, 2013   [facility]-removal of bone spur  . HYSTEROSCOPY WITH THERMACHOICE  04/25/2012   Procedure: HYSTEROSCOPY WITH THERMACHOICE;  Surgeon: Lazaro Arms, MD;  Location: [facility];  Service: Gynecology;  Laterality: N/A;  total therapy time= 11 minutes 42 seconds; 34 ml D5w  in and 34 ml D5w out; temp =87 degrees F  . LEFT HEART CATHETERIZATION WITH CORONARY ANGIOGRAM N/A 12/11/2012   Procedure: LEFT HEART CATHETERIZATION WITH CORONARY ANGIOGRAM;  Surgeon: Runell Gess, MD;  Location: [facility] CATH LAB;  Service: Cardiovascular;  Laterality: N/A;  . Sinus sergery  1988   [place]  . UMBILICAL HERNIA REPAIR N/A 03/25/2014   Procedure: UMBILICAL HERNIA REPAIR;  Surgeon: Marlane Hatcher, MD;  Location: [facility];  Service: General;  Laterality: N/A;  . uterine ablation       OB History    Gravida  2   Para  2   Term  2   Preterm  AB      Living  2     SAB      TAB      Ectopic      Multiple      Live Births              Family History  Problem Relation Age of Onset  . Diabetes Mother   . Depression Paternal Grandmother   . Depression Paternal Grandfather   . Heart disease Other   . Cancer Other   . Diabetes Other   . Achalasia Other   . Anesthesia problems Neg Hx   . Hypotension Neg Hx   . Malignant hyperthermia Neg Hx   .  Pseudochol deficiency Neg Hx     Social History   Tobacco Use  . Smoking status: Never Smoker  . Smokeless tobacco: Never Used  Substance Use Topics  . Alcohol use: No  . Drug use: No    Home Medications Prior to Admission medications   Medication Sig Start Date End Date Taking? Authorizing Provider  albuterol (VENTOLIN HFA) 108 (90 Base) MCG/ACT inhaler Inhale 2 puffs into the lungs every 6 (six) hours as needed for wheezing or shortness of breath.  08/27/19  Yes [provider]  ALPRAZolam Prudy Feeler) 1 MG tablet Take 1 mg by mouth at bedtime.  02/04/18  Yes [provider]  ARIPiprazole (ABILIFY) 2 MG tablet Take 2 mg by mouth at bedtime.  04/13/19  Yes [provider]  buPROPion (WELLBUTRIN XL) 150 MG 24 hr tablet Take 150 mg by mouth daily.  12/29/17  Yes [provider]  DULoxetine (CYMBALTA) 60 MG capsule Take 60 mg by mouth 2 (two) times daily.  02/17/18  Yes [provider]  escitalopram (LEXAPRO) 20 MG tablet Take 40 mg by mouth 2 (two) times daily.  02/07/18  Yes [provider]  furosemide (LASIX) 20 MG tablet Take 40 mg by mouth daily. 12/08/19  Yes [provider]  gabapentin (NEURONTIN) 600 MG tablet Take 600 mg by mouth 3 (three) times daily.    Yes [provider]  HUMALOG 100 UNIT/ML injection Inject into the skin continuous. 3.5 unirs every 1 hr-insulin pump 01/06/15  Yes [provider]  LORazepam (ATIVAN) 0.5 MG tablet Take 0.5 mg by mouth daily as needed for anxiety.  01/29/18  Yes [provider]  Multiple Vitamin (MULTIVITAMIN WITH MINERALS) TABS tablet Take 1 tablet by mouth daily.   Yes [provider]  omeprazole (PRILOSEC) 40 MG capsule Take 40 mg by mouth daily. 02/24/20  Yes [provider]  oxybutynin (DITROPAN-XL) 5 MG 24 hr tablet Take 5 mg by mouth daily. 12/15/19  Yes [provider]  simvastatin (ZOCOR) 40 MG tablet Take 40 mg by mouth daily.  03/29/19   Yes [provider]    Allergies    Ciprofloxacin, Penicillins, Codeine, Morphine, Sulfonamide derivatives, and Vancomycin  Review of Systems   Review of Systems Ten systems are reviewed and are negative for acute change except as noted in the HPI  Physical Exam Updated Vital Signs BP (!) 145/94   Pulse 86   Temp 98.3 F (36.8 C) (Oral)   Resp (!) 22   Ht 4\' 8"  (1.422 m)   Wt 92.5 kg   SpO2 99%   BMI 45.74 kg/m   Physical Exam Constitutional:      General: She is not in acute distress.    Appearance: Normal appearance. She is well-developed. She is not  ill-appearing or diaphoretic.  HENT:     Head: Normocephalic and atraumatic.     Right Ear: External ear normal.     Left Ear: External ear normal.     Nose: Nose normal.  Eyes:     General: Vision grossly intact. Gaze aligned appropriately.     Pupils: Pupils are equal, round, and reactive to light.  Neck:     Trachea: Trachea and phonation normal. No tracheal deviation.  Pulmonary:     Effort: Pulmonary effort is normal. No respiratory distress.  Abdominal:     General: There is no distension.     Palpations: Abdomen is soft.     Tenderness: There is generalized abdominal tenderness. There is guarding. There is no rebound. Negative signs include Murphy's sign and McBurney's sign.  Musculoskeletal:        General: Normal range of motion.     Cervical back: Normal range of motion.  Skin:    General: Skin is warm and dry.  Neurological:     Mental Status: She is alert.     GCS: GCS eye subscore is 4. GCS verbal subscore is 5. GCS motor subscore is 6.     Comments: Speech is clear and goal oriented, follows commands Major Cranial nerves without deficit, no facial droop Moves extremities without ataxia, coordination intact  Psychiatric:        Behavior: Behavior normal.     ED Results / Procedures / Treatments   Labs (all labs ordered are listed, but only abnormal results are displayed) Labs Reviewed    CBC WITH DIFFERENTIAL/PLATELET - Abnormal; Notable for the following components:      Result Value   WBC 13.7 (*)    RBC 5.16 (*)    Neutro Abs 10.8 (*)    All other components within normal limits  COMPREHENSIVE METABOLIC PANEL - Abnormal; Notable for the following components:   Sodium 127 (*)    Potassium 2.7 (*)    Chloride 82 (*)    Glucose, Bld 775 (*)    Creatinine, Ser 1.09 (*)    AST 68 (*)    ALT 66 (*)    Alkaline Phosphatase 161 (*)    GFR calc non Af Amer 58 (*)    Anion gap 18 (*)    All other components within normal limits  URINALYSIS, ROUTINE W REFLEX MICROSCOPIC - Abnormal; Notable for the following components:   Glucose, UA >=500 (*)    Hgb urine dipstick MODERATE (*)    All other components within normal limits  BLOOD GAS, VENOUS - Abnormal; Notable for the following components:   pH, Ven 7.471 (*)    pO2, Ven <31.0 (*)    Bicarbonate 30.4 (*)    Acid-Base Excess 8.2 (*)    All other components within normal limits  LACTIC ACID, PLASMA - Abnormal; Notable for the following components:   Lactic Acid, Venous 4.0 (*)    All other components within normal limits  LACTIC ACID, PLASMA - Abnormal; Notable for the following components:   Lactic Acid, Venous 4.2 (*)    All other components within normal limits  BASIC METABOLIC PANEL - Abnormal; Notable for the following components:   Sodium 131 (*)    Potassium 3.1 (*)    Chloride 90 (*)    Glucose, Bld 566 (*)    Calcium 8.4 (*)    All other components within normal limits  I-STAT BETA HCG BLOOD, ED ([facility], [facility], [facility] ONLY) - Abnormal; Notable  for the following components:   I-stat hCG, quantitative 6.7 (*)    All other components within normal limits  CBG MONITORING, ED - Abnormal; Notable for the following components:   Glucose-Capillary >600 (*)    All other components within normal limits  CBG MONITORING, ED - Abnormal; Notable for the following components:   Glucose-Capillary 543 (*)    All other components  within normal limits  CBG MONITORING, ED - Abnormal; Notable for the following components:   Glucose-Capillary 474 (*)    All other components within normal limits  CBG MONITORING, ED - Abnormal; Notable for the following components:   Glucose-Capillary 421 (*)    All other components within normal limits  CBG MONITORING, ED - Abnormal; Notable for the following components:   Glucose-Capillary 321 (*)    All other components within normal limits  CULTURE, BLOOD (ROUTINE X 2)  CULTURE, BLOOD (ROUTINE X 2)  RESPIRATORY PANEL BY RT PCR (FLU A&B, COVID)  LIPASE, BLOOD  PREGNANCY, URINE  BETA-HYDROXYBUTYRIC ACID  BETA-HYDROXYBUTYRIC ACID    EKG None  Radiology CT ABDOMEN PELVIS W CONTRAST  Result Date: 02/25/2020 CLINICAL DATA:  Unintentional 35 pound weight loss since 08/2019, patient diagnosed with COVID-19 in September 2020. Abdominal distension. EXAM: CT ABDOMEN AND PELVIS WITH CONTRAST TECHNIQUE: Multidetector CT imaging of the abdomen and pelvis was performed using the standard protocol following bolus administration of intravenous contrast. CONTRAST:  OMNIPAQUE IOHEXOL 300 MG/ML  SOLN COMPARISON:  09/21/2014 FINDINGS: Lower chest: Clear lung bases.  Heart normal in size. Hepatobiliary: Liver mildly enlarged, 25 cm transversely. Diffusely decreased attenuation of the liver is noted consistent with fatty infiltration. No mass or focal lesion. Status post cholecystectomy. No bile duct dilation Pancreas: Unremarkable. No pancreatic ductal dilatation or surrounding inflammatory changes. Spleen: Normal in size without focal abnormality. Adrenals/Urinary Tract: 7 mm left adrenal nodule, stable consistent with an adenoma. Normal right adrenal gland. Moderate to marked right hydronephrosis and hydroureter. This is due to a 6 mm stone in the distal ureter. Mild right renal cortical thinning. There are adjacent stones in the lower pole the right kidney with a combined greatest dimension of 11  mm. Nonobstructing stones are noted in the upper lower pole of the left kidney. No renal masses. Symmetric renal enhancement and excretion. No left hydronephrosis. Normal left ureter. Normal bladder. Stomach/Bowel: Stomach is within normal limits. Appendix appears normal. No evidence of bowel wall thickening, distention, or inflammatory changes. No bowel mass visualized. Vascular/Lymphatic: Minimal aortic atherosclerosis. No aneurysm. No enlarged lymph nodes. Reproductive: Uterus and bilateral adnexa are unremarkable. Other: No abdominal wall hernia or abnormality. No abdominopelvic ascites. Musculoskeletal: Chronic bilateral pars defects at L5-S1 with a minor anterolisthesis. No acute fracture. No osteoblastic or osteolytic lesions. IMPRESSION: 1. 6 mm stone in the distal right ureter causes moderate to marked right hydroureteronephrosis. 2. No other acute abnormality. Bilateral nonobstructing intrarenal stones. 3. Mild hepatomegaly and diffuse hepatic steatosis. Electronically Signed   By: Amie Portland M.D.   On: 02/25/2020 16:55    Procedures .Critical Care Performed by: Bill Salinas, PA-C Authorized by: Bill Salinas, PA-C   Critical care provider statement:    Critical care time (minutes):  35   Critical care was necessary to treat or prevent imminent or life-threatening deterioration of the following conditions:  Metabolic crisis   Critical care was time spent personally by me on the following activities:  Discussions with consultants, evaluation of patient's response to treatment, examination of patient, ordering and  performing treatments and interventions, ordering and review of laboratory studies, ordering and review of radiographic studies, pulse oximetry, re-evaluation of patient's condition, obtaining history from patient or surrogate, review of old charts and development of treatment plan with patient or surrogate   (including critical care time)  Medications Ordered in  ED Medications  insulin regular, human (MYXREDLIN) 100 units/ 100 mL infusion (6.5 Units/hr Intravenous Rate/Dose Change 02/25/20 1731)  0.9 %  sodium chloride infusion ( Intravenous Paused 02/25/20 1721)  dextrose 5 %-0.45 % sodium chloride infusion (has no administration in time range)  dextrose 50 % solution 0-50 mL (has no administration in time range)  potassium chloride 10 mEq in 100 mL IVPB (10 mEq Intravenous New Bag/Given 02/25/20 1715)  sodium chloride 0.9 % bolus 1,000 mL (0 mLs Intravenous Stopped 02/25/20 1451)  ondansetron (ZOFRAN) injection 4 mg (4 mg Intravenous Given 02/25/20 1335)  LORazepam (ATIVAN) tablet 1 mg (1 mg Oral Given 02/25/20 1402)  sodium chloride 0.9 % bolus 500 mL (0 mLs Intravenous Stopped 02/25/20 1523)  sodium chloride 0.9 % bolus 500 mL (500 mLs Intravenous Bolus from Bag 02/25/20 1721)  iohexol (OMNIPAQUE) 300 MG/ML solution 100 mL (100 mLs Intravenous Contrast Given 02/25/20 1618)  cefTRIAXone (ROCEPHIN) 1 g in sodium chloride 0.9 % 100 mL IVPB (1 g Intravenous New Bag/Given 02/25/20 1718)    ED Course  I have reviewed the triage vital signs and the nursing notes.  Pertinent labs & imaging results that were available during my care of the patient were reviewed by me and considered in my medical decision making (see chart for details).  Clinical Course as of Feb 25 1811  Thu Feb 25, 2020  1720 Dr. Mena Goes   [BM]  1751 Dr. Mariea Clonts   [BM]    Clinical Course User Index [BM] Bill Salinas, PA-C   [name] was evaluated in Emergency Department on 02/25/2020 for the symptoms described in the history of present illness. She was evaluated in the context of the global COVID-19 pandemic, which necessitated consideration that the patient might be at risk for infection with the SARS-CoV-2 virus that causes COVID-19. Institutional protocols and algorithms that pertain to the evaluation of patients at risk for COVID-19 are in a state of rapid change based on  information released by regulatory bodies including the CDC and federal and state organizations. These policies and algorithms were followed during the patient's care in the ED.  MDM Rules/Calculators/A&P                     54 year old female feeling unwell for the past 6 months since diagnosed with Covid 19 in September 2020.  On exam she has generalized abdominal pain and voluntary guarding no specific abdominal tenderness.  She has no chest pain or shortness of breath.  She is unclear what labs were abnormal yesterday.  Will obtain general abdominal labs including CBC, CMP, lipase and urinalysis.  As patient with ongoing abdominal pain and tender on exam we will also obtain CT abdomen pelvis to assess for intra-abdominal infection or possible malignancy given patient's significant weight loss over the past few months.  Vital signs stable. - CBG elevated greater than 600.  VBG shows decreased PO2, normal bicarb and pH of 7.471, not supportive of diabetic ketoacidosis.  CBC does show leukocytosis of 13.7 with left shift, no evidence of anemia.  Patient has received 1 L fluid bolus so far, will give additional 500 mL bolus and await CMP prior  to initiating further treatment - CMP shows hypokalemia at 2.7, hyponatremia at 1.27, glucose 775, mild elevation of creatinine at 1.09 and mild elevation of LFTs, gap 18.  No elevation of bicarb, does not appear patient is in DKA at this time.  Patient seen and evaluated by Dr. Adriana Simas, will initiate DKA order set and give potassium per order set.  CT abdomen pelvis is pending, will await prior to admission.  Additionally lipase within normal limits no evidence of pancreatitis.  Beta-hCG was mildly elevated at 6.7 likely false positive, follow-up urine pregnancy test was negative.  Urinalysis shows glucose and hemoglobin no evidence of infection.  Lactic 4.0, fluids have been given. - CT abdomen pelvis:  IMPRESSION:  1. 6 mm stone in the distal right ureter  causes moderate to marked  right hydroureteronephrosis.  2. No other acute abnormality. Bilateral nonobstructing intrarenal  stones.  3. Mild hepatomegaly and diffuse hepatic steatosis.     Repeat BMP shows improvement, potassium now 3.1, sodium 131, chloride 90, glucose 566, gap closed.  Patient was started on Rocephin for possible infectious etiology of lactic acidosis and abdominal pain, she denied any allergies to Rocephin reports she has had this medication in the past without reaction.  Additionally she reports that her penicillin allergy was greater than 20 years ago and she cannot caught her reaction at that time. - Patient reassessed resting comfortably no acute distress states understanding of work-up as above and has no questions at this time.  She is agreeable for admission.  Vital signs stable. - Consult called to urology spoke with Dr. Mena Goes who plans for stenting tomorrow here at [facility]. - Discussed case with hospitalist Dr. Mariea Clonts who is accepted patient to her service for further evaluation.  Note: Portions of this report may have been transcribed using voice recognition software. Every effort was made to ensure accuracy; however, inadvertent computerized transcription errors may still be present. Final Clinical Impression(s) / ED Diagnoses Final diagnoses:  Hyperglycemia  Hypokalemia  Ureterolithiasis    Rx / DC Orders ED Discharge Orders    None       Elizabeth Palau 02/25/20 1813    Donnetta Hutching, MD 02/28/20 [phone]

## 2020-02-25 NOTE — Transfer of Care (Signed)
Immediate Anesthesia Transfer of Care Note  Patient: [name]  Procedure(s) Performed: CYSTOSCOPY WITH STENT PLACEMENT (Right )  Patient Location: PACU  Anesthesia Type:General  Level of Consciousness: awake, alert , oriented and sedated  Airway & Oxygen Therapy: Patient Spontanous Breathing and Patient connected to nasal cannula oxygen  Post-op Assessment: Report given to RN and Post -op Vital signs reviewed and stable  Post vital signs: Reviewed and stable  Last Vitals:  Vitals Value Taken Time  BP 145/70 02/25/20 2115  Temp    Pulse 88 02/25/20 2121  Resp 24 02/25/20 2121  SpO2 97 % 02/25/20 2121  Vitals shown include unvalidated device data.  Last Pain:  Vitals:   02/25/20 1300  TempSrc: Oral  PainSc: 7          Complications: No apparent anesthesia complications

## 2020-02-25 NOTE — Anesthesia Preprocedure Evaluation (Addendum)
Anesthesia Evaluation  Patient identified by MRN, date of birth, ID band Patient awake    Reviewed: Allergy & Precautions, NPO status , Patient's Chart, lab work & pertinent test results  Airway Mallampati: II  TM Distance: >3 FB Neck ROM: Full    Dental  (+) Teeth Intact   Pulmonary shortness of breath and with exertion, sleep apnea (snoring) , pneumonia (covid pneumonia), resolved,    breath sounds clear to auscultation       Cardiovascular METS: 3 - Mets hypertension, Pt. on medications Normal cardiovascular exam Rhythm:Regular Rate:Normal     Neuro/Psych  Headaches, PSYCHIATRIC DISORDERS Depression    GI/Hepatic GERD  Medicated and Controlled,Fatty liver   Endo/Other  diabetes, Poorly Controlled, Type 2, Insulin DependentMorbid obesity  Renal/GU Renal disease     Musculoskeletal  (+) Arthritis ,   Abdominal   Peds  Hematology negative hematology ROS (+)   Anesthesia Other Findings   Reproductive/Obstetrics                            Anesthesia Physical Anesthesia Plan  ASA: IV and emergent  Anesthesia Plan: General   Post-op Pain Management:    Induction: Intravenous and Rapid sequence  PONV Risk Score and Plan: 4 or greater and Ondansetron, Metaclopromide and Treatment may vary due to age or medical condition  Airway Management Planned: Oral ETT  Additional Equipment:   Intra-op Plan:   Post-operative Plan:   Informed Consent: I have reviewed the patients History and Physical, chart, labs and discussed the procedure including the risks, benefits and alternatives for the proposed anesthesia with the patient or authorized representative who has indicated his/her understanding and acceptance.     Dental advisory given  Plan Discussed with: Surgeon  Anesthesia Plan Comments:         Anesthesia Quick Evaluation

## 2020-02-25 NOTE — Anesthesia Procedure Notes (Signed)
Procedure Name: Intubation Date/Time: 02/25/2020 8:39 PM Performed by: Denese Killings, MD Pre-anesthesia Checklist: Patient identified, Emergency Drugs available, Suction available and Patient being monitored Patient Re-evaluated:Patient Re-evaluated prior to induction Oxygen Delivery Method: Circle system utilized Preoxygenation: Pre-oxygenation with 100% oxygen Induction Type: IV induction and Rapid sequence Laryngoscope Size: Mac Tube type: Oral Tube size: 7.0 mm Number of attempts: 1 Airway Equipment and Method: Stylet and Oral airway Placement Confirmation: ETT inserted through vocal cords under direct vision,  positive ETCO2 and breath sounds checked- equal and bilateral Secured at: 22 cm Tube secured with: Tape Dental Injury: Teeth and Oropharynx as per pre-operative assessment

## 2020-02-25 NOTE — Progress Notes (Signed)
Patient just arrived to ED.  Hx. Of Type 1 DM- and patient left insulin pump at home. CMP pending. Likely need insulin drip/IV insulin.   Thanks,  Beryl Meager, RN, BC-ADM Inpatient Diabetes Coordinator Pager [phone]

## 2020-02-25 NOTE — ED Notes (Signed)
Pt c/o headache, "eyes hurting", 28lb weight loss since January, nausea, vomiting, generalized abdominal pain, polyuria, polydipsia, decreased appetite, "blah feeling" for "awhile now". Pt reports she had Covid in Sept. 2020 and "hasn't felt well since". Pt reports she is a type I diabetic (diagnosed at age 54) but did not bring her insulin pump with her "because she forgot". Pt reports she was taking a shower and took it off and forgot to reattach it PTA. Pt is very tearful, anxious and continuously repeating "I'm just so scared".

## 2020-02-25 NOTE — Consult Note (Signed)
Consult: right ureteral stone, right hydronephrosis  Requested by: Dr. Eber Hong  History of Present Illness: Patient presented with abdominal pain and weight loss over past few months. On CMP she had glucose of 775 and K 2.7. Cr 1.09. WBC 13.7, UA with neg nitrite, negative LE, 11-20 rbc, no WBC or bacteria.  CT scan revealed a 6 mm right distal ureteral stone with moderate to severe right hydroureteronephrosis.  A 10 mm right lower pole stone.  A 13 mm left upper pole stone.  Lactic acid was 4.  She has a history of stones and saw Dr. Retta Diones years ago.  She was referred back in January of this year.  Past Medical History:  Diagnosis Date  . Diabetes mellitus    insulin pump 2013  . Hypercholesteremia   . Hypertension   . Kidney stone   . Neuropathy   . Obesity   . Obstructive sleep apnea   . Palpitations   . Shortness of breath    Past Surgical History:  Procedure Laterality Date  . CARDIAC CATHETERIZATION  12/11/2012   normal coronary arteries  . CESAREAN SECTION  1994;2000   [facility]  . CHOLECYSTECTOMY  1992  . CYSTOSCOPY W/ URETERAL STENT PLACEMENT  05/08/2012   Procedure: CYSTOSCOPY WITH RETROGRADE PYELOGRAM/URETERAL STENT PLACEMENT;  Surgeon: Ky Barban, MD;  Location: [facility];  Service: Urology;  Laterality: Right;  . FOOT SURGERY Right March, 2013   [facility]-removal of bone spur  . HYSTEROSCOPY WITH THERMACHOICE  04/25/2012   Procedure: HYSTEROSCOPY WITH THERMACHOICE;  Surgeon: Lazaro Arms, MD;  Location: [facility];  Service: Gynecology;  Laterality: N/A;  total therapy time= 11 minutes 42 seconds; 34 ml D5w  in and 34 ml D5w out; temp =87 degrees F  . LEFT HEART CATHETERIZATION WITH CORONARY ANGIOGRAM N/A 12/11/2012   Procedure: LEFT HEART CATHETERIZATION WITH CORONARY ANGIOGRAM;  Surgeon: Runell Gess, MD;  Location: [facility] C[facility] LAB;  Service: Cardiovascular;  Laterality: N/A;  . Sinus sergery  1988   [place]  . UMBILICAL HERNIA REPAIR N/A 03/25/2014    Procedure: UMBILICAL HERNIA REPAIR;  Surgeon: Marlane Hatcher, MD;  Location: [facility];  Service: General;  Laterality: N/A;  . uterine ablation      Home Medications:  (Not in a hospital admission)  Allergies:  Allergies  Allergen Reactions  . Ciprofloxacin Hives and Swelling  . Penicillins Anaphylaxis and Rash  . Codeine Other (See Comments)    REACTION:  had hallicunations  . Morphine Nausea And Vomiting and Other (See Comments)    REACTION:   recieved in ED due to [facility] and had multple doses - gave visual hallucinations and vomitting.  . Sulfonamide Derivatives Other (See Comments)    REACTION: Unsure - childhood allergy  . Vancomycin Itching    Family History  Problem Relation Age of Onset  . Diabetes Mother   . Depression Paternal Grandmother   . Depression Paternal Grandfather   . Heart disease Other   . Cancer Other   . Diabetes Other   . Achalasia Other   . Anesthesia problems Neg Hx   . Hypotension Neg Hx   . Malignant hyperthermia Neg Hx   . Pseudochol deficiency Neg Hx    Social History:  reports that she has never smoked. She has never used smokeless tobacco. She reports that she does not drink alcohol or use drugs.  ROS: A complete review of systems was performed.  All systems are negative except for pertinent findings  as noted. Review of Systems  All other systems reviewed and are negative.    Physical Exam:  Vital signs in last 24 hours: Temp:  [98.3 F (36.8 C)] 98.3 F (36.8 C) (03/18 1300) Pulse Rate:  [79-98] 86 (03/18 1600) Resp:  [18-22] 22 (03/18 1600) BP: (141-148)/(71-94) 145/94 (03/18 1600) SpO2:  [97 %-100 %] 99 % (03/18 1600) Weight:  [92.5 kg] 92.5 kg (03/18 1301) General:  Alert and oriented, No acute distress HEENT: Normocephalic, atraumatic Cardiovascular: Regular rate and rhythm Lungs: Regular rate and effort Abdomen: Soft, nontender, nondistended, no abdominal masses Back: No CVA tenderness Extremities: No  edema Neurologic: Grossly intact  Laboratory Data:  Results for orders placed or performed during the hospital encounter of 02/25/20 (from the past 24 hour(s))  CBC with Differential     Status: Abnormal   Collection Time: 02/25/20  1:25 PM  Result Value Ref Range   WBC 13.7 (H) 4.0 - 10.5 K/uL   RBC 5.16 (H) 3.87 - 5.11 MIL/uL   Hemoglobin 14.4 12.0 - 15.0 g/dL   HCT 76.2 83.1 - 51.7 %   MCV 83.9 80.0 - 100.0 fL   MCH 27.9 26.0 - 34.0 pg   MCHC 33.3 30.0 - 36.0 g/dL   RDW 61.6 07.3 - 71.0 %   Platelets 380 150 - 400 K/uL   nRBC 0.0 0.0 - 0.2 %   Neutrophils Relative % 79 %   Neutro Abs 10.8 (H) 1.7 - 7.7 K/uL   Lymphocytes Relative 15 %   Lymphs Abs 2.1 0.7 - 4.0 K/uL   Monocytes Relative 5 %   Monocytes Absolute 0.7 0.1 - 1.0 K/uL   Eosinophils Relative 0 %   Eosinophils Absolute 0.0 0.0 - 0.5 K/uL   Basophils Relative 1 %   Basophils Absolute 0.1 0.0 - 0.1 K/uL   Immature Granulocytes 0 %   Abs Immature Granulocytes 0.04 0.00 - 0.07 K/uL  Comprehensive metabolic panel     Status: Abnormal   Collection Time: 02/25/20  1:25 PM  Result Value Ref Range   Sodium 127 (L) 135 - 145 mmol/L   Potassium 2.7 (LL) 3.5 - 5.1 mmol/L   Chloride 82 (L) 98 - 111 mmol/L   CO2 27 22 - 32 mmol/L   Glucose, Bld 775 (HH) 70 - 99 mg/dL   BUN 8 6 - 20 mg/dL   Creatinine, Ser 6.26 (H) 0.44 - 1.00 mg/dL   Calcium 8.9 8.9 - 94.8 mg/dL   Total Protein 7.4 6.5 - 8.1 g/dL   Albumin 3.5 3.5 - 5.0 g/dL   AST 68 (H) 15 - 41 U/L   ALT 66 (H) 0 - 44 U/L   Alkaline Phosphatase 161 (H) 38 - 126 U/L   Total Bilirubin 0.8 0.3 - 1.2 mg/dL   GFR calc non Af Amer 58 (L) >60 mL/min   GFR calc Af Amer >60 >60 mL/min   Anion gap 18 (H) 5 - 15  Lipase, blood     Status: None   Collection Time: 02/25/20  1:25 PM  Result Value Ref Range   Lipase 40 11 - 51 U/L  Urinalysis, Routine w reflex microscopic     Status: Abnormal   Collection Time: 02/25/20  1:27 PM  Result Value Ref Range   Color, Urine YELLOW  YELLOW   APPearance CLEAR CLEAR   Specific Gravity, Urine 1.023 1.005 - 1.030   pH 7.0 5.0 - 8.0   Glucose, UA >=500 (A) NEGATIVE mg/dL  Hgb urine dipstick MODERATE (A) NEGATIVE   Bilirubin Urine NEGATIVE NEGATIVE   Ketones, ur NEGATIVE NEGATIVE mg/dL   Protein, ur NEGATIVE NEGATIVE mg/dL   Nitrite NEGATIVE NEGATIVE   Leukocytes,Ua NEGATIVE NEGATIVE   RBC / HPF 11-20 0 - 5 RBC/hpf   WBC, UA 0-5 0 - 5 WBC/hpf   Bacteria, UA NONE SEEN NONE SEEN   Squamous Epithelial / LPF 0-5 0 - 5  I-Stat Beta hCG blood, ED ([facility], [facility], [facility] only)     Status: Abnormal   Collection Time: 02/25/20  1:27 PM  Result Value Ref Range   I-stat hCG, quantitative 6.7 (H) <5 mIU/mL   Comment 3          Pregnancy, urine     Status: None   Collection Time: 02/25/20  1:27 PM  Result Value Ref Range   Preg Test, Ur NEGATIVE NEGATIVE  POC CBG, ED     Status: Abnormal   Collection Time: 02/25/20  1:42 PM  Result Value Ref Range   Glucose-Capillary >600 (HH) 70 - 99 mg/dL   Comment 1 Notify RN   Blood gas, venous (at [facility] and [facility], not at [facility])     Status: Abnormal   Collection Time: 02/25/20  2:06 PM  Result Value Ref Range   FIO2 21.00    pH, Ven 7.471 (H) 7.250 - 7.430   pCO2, Ven 44.7 44.0 - 60.0 mmHg   pO2, Ven <31.0 (LL) 32.0 - 45.0 mmHg   Bicarbonate 30.4 (H) 20.0 - 28.0 mmol/L   Acid-Base Excess 8.2 (H) 0.0 - 2.0 mmol/L   O2 Saturation 50.5 %   Patient temperature 36.8   CBG monitoring, ED     Status: Abnormal   Collection Time: 02/25/20  3:07 PM  Result Value Ref Range   Glucose-Capillary 543 (HH) 70 - 99 mg/dL   Comment 1 Notify RN   Lactic acid, plasma     Status: Abnormal   Collection Time: 02/25/20  3:19 PM  Result Value Ref Range   Lactic Acid, Venous 4.0 (HH) 0.5 - 1.9 mmol/L  Beta-hydroxybutyric acid     Status: None   Collection Time: 02/25/20  3:19 PM  Result Value Ref Range   Beta-Hydroxybutyric Acid 0.26 0.05 - 0.27 mmol/L  Basic metabolic panel     Status: Abnormal   Collection Time:  02/25/20  3:19 PM  Result Value Ref Range   Sodium 131 (L) 135 - 145 mmol/L   Potassium 3.1 (L) 3.5 - 5.1 mmol/L   Chloride 90 (L) 98 - 111 mmol/L   CO2 28 22 - 32 mmol/L   Glucose, Bld 566 (HH) 70 - 99 mg/dL   BUN 9 6 - 20 mg/dL   Creatinine, Ser 0.91 0.44 - 1.00 mg/dL   Calcium 8.4 (L) 8.9 - 10.3 mg/dL   GFR calc non Af Amer >60 >60 mL/min   GFR calc Af Amer >60 >60 mL/min   Anion gap 13 5 - 15  CBG monitoring, ED     Status: Abnormal   Collection Time: 02/25/20  3:52 PM  Result Value Ref Range   Glucose-Capillary 474 (H) 70 - 99 mg/dL  CBG monitoring, ED     Status: Abnormal   Collection Time: 02/25/20  4:32 PM  Result Value Ref Range   Glucose-Capillary 421 (H) 70 - 99 mg/dL  Blood culture (routine x 2)     Status: None (Preliminary result)   Collection Time: 02/25/20  4:47 PM   Specimen:  BLOOD RIGHT HAND  Result Value Ref Range   Specimen Description BLOOD RIGHT HAND    Special Requests      BOTTLES DRAWN AEROBIC AND ANAEROBIC Blood Culture adequate volume Performed at [facility], [address] [place], [place] [address]    Culture PENDING    Report Status PENDING   Blood culture (routine x 2)     Status: None (Preliminary result)   Collection Time: 02/25/20  4:48 PM   Specimen: Left Antecubital; Blood  Result Value Ref Range   Specimen Description LEFT ANTECUBITAL    Special Requests      BOTTLES DRAWN AEROBIC AND ANAEROBIC Blood Culture adequate volume Performed at [facility], [address], [place], [place] [address]    Culture PENDING    Report Status PENDING    Recent Results (from the past 240 hour(s))  Blood culture (routine x 2)     Status: None (Preliminary result)   Collection Time: 02/25/20  4:47 PM   Specimen: BLOOD RIGHT HAND  Result Value Ref Range Status   Specimen Description BLOOD RIGHT HAND  Final   Special Requests   Final    BOTTLES DRAWN AEROBIC AND ANAEROBIC Blood Culture adequate volume Performed at [facility], [address], [place], [place] [address]    Culture PENDING  Incomplete   Report Status PENDING  Incomplete  Blood culture (routine x 2)     Status: None (Preliminary result)   Collection Time: 02/25/20  4:48 PM   Specimen: Left Antecubital; Blood  Result Value Ref Range Status   Specimen Description LEFT ANTECUBITAL  Final   Special Requests   Final    BOTTLES DRAWN AEROBIC AND ANAEROBIC Blood Culture adequate volume Performed at [facility], [address], [place], [place] [address]    Culture PENDING  Incomplete   Report Status PENDING  Incomplete   Creatinine: Recent Labs    02/25/20 1325 02/25/20 1519  CREATININE 1.09* 0.91    Impression/Assessment/plan:  I discussed with the patient and her husband the nature, potential benefits, risks and alternatives to cystoscopy, right retrograde pyelogram and right ureteral stent, including side effects of the proposed treatment, the likelihood of the patient achieving the goals of the procedure, and any potential problems that might occur during the procedure or recuperation. Discussed rationale for staged procedure. She asked me to see her and do her ureteroscopy. We discussed ureteroscopy, laser lithotripsy in a few weeks as an outpatient. We can try to do the right side as well but we wont stent that side. All questions answered. Patient elects to proceed.    Jerilee Field 02/25/2020, 5:26 PM

## 2020-02-25 NOTE — ED Notes (Signed)
CRITICAL VALUE ALERT  Critical Value:  Lactic Acid 4.0  Date & Time Notied:  02/25/20 1559  Provider Notified: Harlene Salts, PA  Orders Received/Actions taken: EDP notified

## 2020-02-25 NOTE — ED Notes (Signed)
CRITICAL VALUE ALERT  Critical Value:  VBG pO2 <31.0, Potassium 2/7 & Glucose 775  Date & Time Notied:  02/25/20 1422  Provider Notified: Harlene Salts, PA  Orders Received/Actions taken: EDP notified

## 2020-02-25 NOTE — Anesthesia Postprocedure Evaluation (Signed)
Anesthesia Post Note  Patient: [name]  Procedure(s) Performed: CYSTOSCOPY WITH STENT PLACEMENT (Right )  Patient location during evaluation: PACU Anesthesia Type: General Level of consciousness: awake and alert, oriented and sedated Pain management: pain level controlled Vital Signs Assessment: post-procedure vital signs reviewed and stable Respiratory status: spontaneous breathing, respiratory function stable and patient connected to nasal cannula oxygen Cardiovascular status: stable and blood pressure returned to baseline Postop Assessment: no apparent nausea or vomiting Anesthetic complications: no     Last Vitals:  Vitals:   02/25/20 2115 02/25/20 2130  BP: (!) 145/70 (!) 152/71  Pulse: 91 89  Resp: (!) 25 (!) 22  Temp: 36.4 C   SpO2: 98% 96%    Last Pain:  Vitals:   02/25/20 2130  TempSrc:   PainSc: 10-Worst pain ever                 Chonda Baney C Ruford Dudzinski

## 2020-02-26 ENCOUNTER — Encounter (HOSPITAL_COMMUNITY): Payer: Self-pay | Admitting: Internal Medicine

## 2020-02-26 DIAGNOSIS — E876 Hypokalemia: Secondary | ICD-10-CM

## 2020-02-26 DIAGNOSIS — E1165 Type 2 diabetes mellitus with hyperglycemia: Secondary | ICD-10-CM

## 2020-02-26 DIAGNOSIS — N201 Calculus of ureter: Secondary | ICD-10-CM

## 2020-02-26 DIAGNOSIS — G4733 Obstructive sleep apnea (adult) (pediatric): Secondary | ICD-10-CM

## 2020-02-26 LAB — BASIC METABOLIC PANEL
Anion gap: 10 (ref 5–15)
BUN: 7 mg/dL (ref 6–20)
CO2: 29 mmol/L (ref 22–32)
Calcium: 7.7 mg/dL — ABNORMAL LOW (ref 8.9–10.3)
Chloride: 97 mmol/L — ABNORMAL LOW (ref 98–111)
Creatinine, Ser: 0.72 mg/dL (ref 0.44–1.00)
GFR calc Af Amer: >60 mL/min (ref >60)
GFR calc non Af Amer: >60 mL/min (ref >60)
Glucose, Bld: 319 mg/dL — ABNORMAL HIGH (ref 70–99)
Potassium: 2.7 mmol/L — CL (ref 3.5–5.1)
Sodium: 136 mmol/L (ref 135–145)

## 2020-02-26 LAB — CBC WITH DIFFERENTIAL/PLATELET
Abs Immature Granulocytes: 0.02 10*3/uL (ref 0.00–0.07)
Basophils Absolute: 0.1 10*3/uL (ref 0.0–0.1)
Basophils Relative: 1 %
Eosinophils Absolute: 0.1 10*3/uL (ref 0.0–0.5)
Eosinophils Relative: 1 %
HCT: 37.9 % (ref 36.0–46.0)
Hemoglobin: 12.5 g/dL (ref 12.0–15.0)
Immature Granulocytes: 0 %
Lymphocytes Relative: 35 %
Lymphs Abs: 3.3 10*3/uL (ref 0.7–4.0)
MCH: 28.3 pg (ref 26.0–34.0)
MCHC: 33 g/dL (ref 30.0–36.0)
MCV: 85.9 fL (ref 80.0–100.0)
Monocytes Absolute: 0.5 10*3/uL (ref 0.1–1.0)
Monocytes Relative: 6 %
Neutro Abs: 5.5 10*3/uL (ref 1.7–7.7)
Neutrophils Relative %: 57 %
Platelets: 276 10*3/uL (ref 150–400)
RBC: 4.41 MIL/uL (ref 3.87–5.11)
RDW: 13.7 % (ref 11.5–15.5)
WBC: 9.5 10*3/uL (ref 4.0–10.5)
nRBC: 0 % (ref 0.0–0.2)

## 2020-02-26 LAB — COMPREHENSIVE METABOLIC PANEL
ALT: 48 U/L — ABNORMAL HIGH (ref 0–44)
AST: 56 U/L — ABNORMAL HIGH (ref 15–41)
Albumin: 2.9 g/dL — ABNORMAL LOW (ref 3.5–5.0)
Alkaline Phosphatase: 109 U/L (ref 38–126)
Anion gap: 10 (ref 5–15)
BUN: 6 mg/dL (ref 6–20)
CO2: 27 mmol/L (ref 22–32)
Calcium: 7.9 mg/dL — ABNORMAL LOW (ref 8.9–10.3)
Chloride: 99 mmol/L (ref 98–111)
Creatinine, Ser: 0.72 mg/dL (ref 0.44–1.00)
GFR calc Af Amer: >60 mL/min (ref >60)
GFR calc non Af Amer: >60 mL/min (ref >60)
Glucose, Bld: 264 mg/dL — ABNORMAL HIGH (ref 70–99)
Potassium: 3.1 mmol/L — ABNORMAL LOW (ref 3.5–5.1)
Sodium: 136 mmol/L (ref 135–145)
Total Bilirubin: 0.4 mg/dL (ref 0.3–1.2)
Total Protein: 6 g/dL — ABNORMAL LOW (ref 6.5–8.1)

## 2020-02-26 LAB — MAGNESIUM: Magnesium: 1.8 mg/dL (ref 1.7–2.4)

## 2020-02-26 LAB — GLUCOSE, CAPILLARY
Glucose-Capillary: 260 mg/dL — ABNORMAL HIGH (ref 70–99)
Glucose-Capillary: 173 mg/dL — ABNORMAL HIGH (ref 70–99)
Glucose-Capillary: 179 mg/dL — ABNORMAL HIGH (ref 70–99)

## 2020-02-26 LAB — MRSA PCR SCREENING: MRSA by PCR: NEGATIVE

## 2020-02-26 LAB — HIV ANTIBODY (ROUTINE TESTING W REFLEX): HIV Screen 4th Generation wRfx: NONREACTIVE

## 2020-02-26 LAB — TROPONIN I (HIGH SENSITIVITY): Troponin I (High Sensitivity): 5 ng/L (ref <18)

## 2020-02-26 LAB — LACTIC ACID, PLASMA: Lactic Acid, Venous: 1.4 mmol/L (ref 0.5–1.9)

## 2020-02-26 LAB — HEMOGLOBIN A1C
Hgb A1c MFr Bld: 15.1 % — ABNORMAL HIGH (ref 4.8–5.6)
Mean Plasma Glucose: 386.67 mg/dL

## 2020-02-26 MED ORDER — ADULT MULTIVITAMIN W/MINERALS CH
1.0000 | ORAL_TABLET | Freq: Every day | ORAL | Status: DC
  Administered 2020-02-26: 1 via ORAL
  Filled 2020-02-26: qty 1

## 2020-02-26 MED ORDER — INSULIN ASPART 100 UNIT/ML ~~LOC~~ SOLN
0.0000 [IU] | Freq: Three times a day (TID) | SUBCUTANEOUS | Status: DC
  Administered 2020-02-26 (×2): 4 [IU] via SUBCUTANEOUS

## 2020-02-26 MED ORDER — POTASSIUM CHLORIDE CRYS ER 20 MEQ PO TBCR
60.0000 meq | EXTENDED_RELEASE_TABLET | Freq: Once | ORAL | Status: AC
  Administered 2020-02-26: 60 meq via ORAL
  Filled 2020-02-26: qty 3

## 2020-02-26 MED ORDER — SIMVASTATIN 20 MG PO TABS: 40.0000 mg | ORAL_TABLET | Freq: Every day | ORAL | Status: DC

## 2020-02-26 MED ORDER — CEFDINIR 300 MG PO CAPS: 600.0000 mg | ORAL_CAPSULE | Freq: Every day | ORAL | 0 refills | Status: AC

## 2020-02-26 MED ORDER — POTASSIUM CHLORIDE CRYS ER 20 MEQ PO TBCR
40.0000 meq | EXTENDED_RELEASE_TABLET | Freq: Once | ORAL | Status: AC
  Administered 2020-02-26: 40 meq via ORAL
  Filled 2020-02-26: qty 2

## 2020-02-26 MED ORDER — DULOXETINE HCL 60 MG PO CPEP
60.0000 mg | ORAL_CAPSULE | Freq: Two times a day (BID) | ORAL | Status: DC
  Administered 2020-02-26: 60 mg via ORAL
  Filled 2020-02-26: qty 1

## 2020-02-26 MED ORDER — PHENOL 1.4 % MT LIQD
1.0000 | OROMUCOSAL | Status: DC | PRN
  Administered 2020-02-26: 1 via OROMUCOSAL
  Filled 2020-02-26: qty 177

## 2020-02-26 MED ORDER — LORAZEPAM 0.5 MG PO TABS: 0.5000 mg | ORAL_TABLET | Freq: Four times a day (QID) | ORAL | Status: DC | PRN

## 2020-02-26 MED ORDER — FLUCONAZOLE 150 MG PO TABS: 150.0000 mg | ORAL_TABLET | Freq: Once | ORAL | 0 refills | Status: DC | PRN

## 2020-02-26 MED ORDER — ARIPIPRAZOLE 2 MG PO TABS
2.0000 mg | ORAL_TABLET | Freq: Every day | ORAL | Status: DC
  Filled 2020-02-26 (×3): qty 1

## 2020-02-26 MED ORDER — CHLORHEXIDINE GLUCONATE CLOTH 2 % EX PADS
6.0000 | MEDICATED_PAD | Freq: Every day | CUTANEOUS | Status: DC
  Administered 2020-02-26: 6 via TOPICAL

## 2020-02-26 MED ORDER — OXYBUTYNIN CHLORIDE ER 5 MG PO TB24
5.0000 mg | ORAL_TABLET | Freq: Every day | ORAL | Status: DC
  Administered 2020-02-26: 5 mg via ORAL
  Filled 2020-02-26: qty 1

## 2020-02-26 MED ORDER — INSULIN ASPART 100 UNIT/ML ~~LOC~~ SOLN: 0.0000 [IU] | Freq: Every day | SUBCUTANEOUS | Status: DC

## 2020-02-26 MED ORDER — POTASSIUM CHLORIDE IN NACL 20-0.9 MEQ/L-% IV SOLN: INTRAVENOUS | Status: DC

## 2020-02-26 MED ORDER — INSULIN GLARGINE 100 UNIT/ML ~~LOC~~ SOLN
30.0000 [IU] | Freq: Every day | SUBCUTANEOUS | Status: DC
  Administered 2020-02-26: 30 [IU] via SUBCUTANEOUS
  Filled 2020-02-26 (×5): qty 0.3

## 2020-02-26 MED ORDER — GABAPENTIN 300 MG PO CAPS: 600.0000 mg | ORAL_CAPSULE | Freq: Three times a day (TID) | ORAL | Status: DC | PRN

## 2020-02-26 MED ORDER — FUROSEMIDE 40 MG PO TABS: 40.0000 mg | ORAL_TABLET | Freq: Every day | ORAL | Status: DC

## 2020-02-26 MED ORDER — BUPROPION HCL ER (XL) 150 MG PO TB24
150.0000 mg | ORAL_TABLET | Freq: Every day | ORAL | Status: DC
  Administered 2020-02-26: 150 mg via ORAL
  Filled 2020-02-26: qty 1

## 2020-02-26 MED ORDER — PANTOPRAZOLE SODIUM 40 MG PO TBEC
40.0000 mg | DELAYED_RELEASE_TABLET | Freq: Every day | ORAL | Status: DC
  Administered 2020-02-26: 40 mg via ORAL
  Filled 2020-02-26: qty 1

## 2020-02-26 MED ORDER — ALBUTEROL SULFATE (2.5 MG/3ML) 0.083% IN NEBU: 2.5000 mg | INHALATION_SOLUTION | Freq: Four times a day (QID) | RESPIRATORY_TRACT | Status: DC | PRN

## 2020-02-26 MED ORDER — INSULIN ASPART 100 UNIT/ML ~~LOC~~ SOLN
10.0000 [IU] | Freq: Three times a day (TID) | SUBCUTANEOUS | Status: DC
  Administered 2020-02-26 (×2): 10 [IU] via SUBCUTANEOUS

## 2020-02-26 MED ORDER — POTASSIUM CHLORIDE 10 MEQ/100ML IV SOLN
10.0000 meq | INTRAVENOUS | Status: AC
  Administered 2020-02-26 (×2): 10 meq via INTRAVENOUS
  Filled 2020-02-26 (×2): qty 100

## 2020-02-26 NOTE — Progress Notes (Signed)
I reviewed patient's chart. Foley can be removed this morning. I will arrange outpatient follow-up for ureteroscopy in the next few weeks. OK to d/c from a urology point of view when pt is medically stable. Please secure chat with any questions or page GU on call for any questions or concerns over the weekend.

## 2020-02-26 NOTE — Progress Notes (Signed)
IV's removed, pt belongings returned. Pt and spouse given d/c instructions, follow up care, and prescriptions. They state understanding of instructions. Educated pt on reasons to follow up immediately. Pt able to void at this time with no difficulty. Reminded pt to place insulin pump on tomorrow morning, 02/27/2020.

## 2020-02-26 NOTE — Discharge Instructions (Signed)
Ureteral Stent Implantation, Care After This sheet gives you information about how to care for yourself after your procedure. Your health care provider may also give you more specific instructions. If you have problems or questions, contact your health care provider.  Be sure to follow-up with urologist to plan stent/stone removal.  What can I expect after the procedure? After the procedure, it is common to have:  Nausea.  Mild pain when you urinate. You may feel this pain in your lower back or lower abdomen. The pain should stop within a few minutes after you urinate. This may last for up to 1 week.  A small amount of blood in your urine for several days. Follow these instructions at home: Medicines  Take over-the-counter and prescription medicines only as told by your health care provider.  If you were prescribed an antibiotic medicine, take it as told by your health care provider. Do not stop taking the antibiotic even if you start to feel better.  Do not drive for 24 hours if you were given a sedative during your procedure.  Ask your health care provider if the medicine prescribed to you requires you to avoid driving or using heavy machinery. Activity  Rest as told by your health care provider.  Avoid sitting for a long time without moving. Get up to take short walks every 1-2 hours. This is important to improve blood flow and breathing. Ask for help if you feel weak or unsteady.  Return to your normal activities as told by your health care provider. Ask your health care provider what activities are safe for you. General instructions   Watch for any blood in your urine. Call your health care provider if the amount of blood in your urine increases.  If you have a catheter: ? Follow instructions from your health care provider about taking care of your catheter and collection bag. ? Do not take baths, swim, or use a hot tub until your health care provider approves. Ask your  health care provider if you may take showers. You may only be allowed to take sponge baths.  Drink enough fluid to keep your urine pale yellow.  Do not use any products that contain nicotine or tobacco, such as cigarettes, e-cigarettes, and chewing tobacco. These can delay healing after surgery. If you need help quitting, ask your health care provider.  Keep all follow-up visits as told by your health care provider. This is important. Contact a health care provider if:  You have pain that gets worse or does not get better with medicine, especially pain when you urinate.  You have difficulty urinating.  You feel nauseous or you vomit repeatedly during a period of more than 2 days after the procedure. Get help right away if:  Your urine is dark red or has blood clots in it.  You are leaking urine (have incontinence).  The end of the stent comes out of your urethra.  You cannot urinate.  You have sudden, sharp, or severe pain in your abdomen or lower back.  You have a fever.  You have swelling or pain in your legs.  You have difficulty breathing. Summary  After the procedure, it is common to have mild pain when you urinate that goes away within a few minutes after you urinate. This may last for up to 1 week.  Watch for any blood in your urine. Call your health care provider if the amount of blood in your urine increases.  Take over-the-counter and  prescription medicines only as told by your health care provider.  Drink enough fluid to keep your urine pale yellow. This information is not intended to replace advice given to you by your health care provider. Make sure you discuss any questions you have with your health care provider. Document Revised: 09/02/2018 Document Reviewed: 09/03/2018 Elsevier Patient Education  [place].    IMPORTANT INFORMATION: PAY CLOSE ATTENTION   PHYSICIAN DISCHARGE INSTRUCTIONS  Follow with Primary care provider  Celene Squibb, MD   and other consultants as instructed by your Hospitalist Physician  [place] IF SYMPTOMS COME BACK, WORSEN OR NEW PROBLEM DEVELOPS   Please note: You were cared for by a hospitalist during your hospital stay. Every effort will be made to forward records to your primary care provider.  You can request that your primary care provider send for your hospital records if they have not received them.  Once you are discharged, your primary care physician will handle any further medical issues. Please note that NO REFILLS for any discharge medications will be authorized once you are discharged, as it is imperative that you return to your primary care physician (or establish a relationship with a primary care physician if you do not have one) for your post hospital discharge needs so that they can reassess your need for medications and monitor your lab values.  Please get a complete blood count and chemistry panel checked by your Primary MD at your next visit, and again as instructed by your Primary MD.  Get Medicines reviewed and adjusted: Please take all your medications with you for your next visit with your Primary MD  Laboratory/radiological data: Please request your Primary MD to go over all hospital tests and procedure/radiological results at the follow up, please ask your primary care provider to get all Hospital records sent to his/her office.  In some cases, they will be blood work, cultures and biopsy results pending at the time of your discharge. Please request that your primary care provider follow up on these results.  If you are diabetic, please bring your blood sugar readings with you to your follow up appointment with primary care.    Please call and make your follow up appointments as soon as possible.    Also Note the following: If you experience worsening of your admission symptoms, develop shortness of breath, life threatening emergency, suicidal  or homicidal thoughts you must seek medical attention immediately by calling 911 or calling your MD immediately  if symptoms less severe.  You must read complete instructions/literature along with all the possible adverse reactions/side effects for all the Medicines you take and that have been prescribed to you. Take any new Medicines after you have completely understood and accpet all the possible adverse reactions/side effects.   Do not drive when taking Pain medications or sleeping medications (Benzodiazepines)  Do not take more than prescribed Pain, Sleep and Anxiety Medications. It is not advisable to combine anxiety,sleep and pain medications without talking with your primary care practitioner  Special Instructions: If you have smoked or chewed Tobacco  in the last 2 yrs please stop smoking, stop any regular Alcohol  and or any Recreational drug use.  Wear Seat belts while driving.  Do not drive if taking any narcotic, mind altering or controlled substances or recreational drugs or alcohol.

## 2020-02-26 NOTE — Addendum Note (Signed)
Addendum  created 02/26/20 1224 by Molli Barrows, MD   Charge Capture section accepted

## 2020-02-26 NOTE — Discharge Summary (Addendum)
Physician Discharge Summary  [name] EAV:[id number] DOB: 23-Apr-1966 DOA: 02/25/2020  PCP: Benita Stabile, MD Urologist: Dr. Mena Goes Admit date: 02/25/2020 Discharge date: 02/26/2020  Admitted From:  Home  Disposition: Home   Recommendations for Outpatient Follow-up:  1. Follow up with PCP in 1 weeks 2. Follow up with Dr. Eskridge/Urology as scheduled  Discharge Condition: STABLE   CODE STATUS: FULL    Brief Hospitalization Summary: Please see all hospital notes, images, labs for full details of the hospitalization. ADMISSION HPI: [name] is a 54 y.o. female with medical history significant for type I diabetes mellitus, hypertension, depression, obesity.  Patient presented to the ED with complaints of polyuria, polydipsia over the past few days.  She also has been having generalized abdominal pain, intermittent over the past several weeks.  She has a history of kidney stones but did not think this pain was similar to that. She also reports she was diagnosed with COVID-29 August 2019.  She did not require hospitalization.  She reports since then she has not been back to her normal self.  She denies pain/burning with urination, but reports occasional urinary incontinence that is not new.  She reports chills, but no fevers.  Patient is on an insulin pump for her type 1 diabetes.  The last time she took any insulin or checked her blood sugars was 2 days ago.  She reports her blood sugars have never been this high.  Patient also reports ~30lb unintentional weight loss in the past 3 months.  She reports appetite has been poor.  She has kept up with all her screenings including mammograms, Pap smears.  She has a colonoscopy planned.  Never smoked cigarettes.  History of "throat cancer" in uncle and "other cancer" in maternal grandmother.  Patient saw her primary care provider yesterday had blood work done and was called today to come to the ED, due to abnormal lab.  But she is unaware  of exactly what was abnormal.  ED Course: Temperature 98.3, stable vitals.  Hyperglycemia glucose 775, with mildly increased anion gap of 18, normal serum bicarb of 27.  Normal beta butyrate acid.  pH of 7.4.  Lactic acid elevated 4 > 4.2.  WBC 13.7.  Mild elevation in LFTs-AST 68, ALT 66, ALP 161.  Abdominal CT with contrast shows a 6 mm stone in distal right ureter causing moderate to marked right hydroureteronephrosis.  IV ceftriaxone 1 g given. EDP talked to Dr. Mena Goes, plans for stenting.  The patient was admitted with hyperglycemic crisis but not in DKA.  She had not been using her insulin pump for the past couple of days.  She has a type 1.5 diabetes mellitus and has been insulin-dependent for several years.  Her body still does make some low amounts of insulin that was enough to keep her out of DKA but her blood sugars were markedly elevated and she was initially placed on an IV insulin infusion.  Patient was on subcu insulin in the hospital as she forgot to bring her insulin pump.  I have told her that she could restart her insulin pump tomorrow morning as the Lantus was given today.  She verbalized understanding her blood sugars are much better controlled now.  She was discovered to have a right kidney stone with right hydroureteronephrosis.  There is a 6 mm stone found in the distal right ureter.  She was seen by Dr. Mena Goes with urology and a stent was placed on 02/25/2020.  Patient tolerated  it well.  She was monitored overnight in the stepdown ICU.  She remained stable.  She is now eating and drinking well.  She is having no fever or chills.  She reports that she is feeling much better.  Her lactic acidosis has completely resolved with IV fluids.  No signs of infection found.  She had been on IV ceftriaxone and tolerated that very well.  Urology said that she was stable to discharge with close outpatient follow-up they have made arrangements for her to follow-up with the urology  office.  She is stable to discharge home with close outpatient follow-up as arranged.  She was advised to monitor blood glucose closely.  Discharge Diagnoses:  Principal Problem:   Ureterolithiasis Active Problems:   OBESITY   NEPHROLITHIASIS   Diabetes mellitus type II, uncontrolled (HCC)   Obstructive sleep apnea   Hyperglycemia   Hypokalemia   Discharge Instructions:  Allergies as of 02/26/2020      Reactions   Ciprofloxacin Hives, Swelling   Penicillins Anaphylaxis, Rash   Codeine Other (See Comments)   REACTION:  had hallicunations   Morphine Nausea And Vomiting, Other (See Comments)   REACTION:   recieved in ED due to [facility] and had multple doses - gave visual hallucinations and vomitting.   Sulfonamide Derivatives Other (See Comments)   REACTION: Unsure - childhood allergy   Vancomycin Itching      Medication List    STOP taking these medications   escitalopram 20 MG tablet Commonly known as: LEXAPRO     TAKE these medications   albuterol 108 (90 Base) MCG/ACT inhaler Commonly known as: VENTOLIN HFA Inhale 2 puffs into the lungs every 6 (six) hours as needed for wheezing or shortness of breath.   ALPRAZolam 1 MG tablet Commonly known as: XANAX Take 1 mg by mouth at bedtime.   ARIPiprazole 2 MG tablet Commonly known as: ABILIFY Take 2 mg by mouth at bedtime.   buPROPion 150 MG 24 hr tablet Commonly known as: WELLBUTRIN XL Take 150 mg by mouth daily.   cefdinir 300 MG capsule Commonly known as: OMNICEF Take 2 capsules (600 mg total) by mouth daily for 3 days.   DULoxetine 60 MG capsule Commonly known as: CYMBALTA Take 60 mg by mouth 2 (two) times daily.   fluconazole 150 MG tablet Commonly known as: DIFLUCAN Take 1 tablet (150 mg total) by mouth once as needed for up to 1 dose. Repeat if needed   furosemide 20 MG tablet Commonly known as: LASIX Take 40 mg by mouth daily.   gabapentin 600 MG tablet Commonly known as: NEURONTIN Take 600  mg by mouth 3 (three) times daily.   HumaLOG 100 UNIT/ML injection Generic drug: insulin lispro Inject into the skin continuous. 3.5 unirs every 1 hr-insulin pump   LORazepam 0.5 MG tablet Commonly known as: ATIVAN Take 0.5 mg by mouth daily as needed for anxiety.   multivitamin with minerals Tabs tablet Take 1 tablet by mouth daily.   omeprazole 40 MG capsule Commonly known as: PRILOSEC Take 40 mg by mouth daily.   oxybutynin 5 MG 24 hr tablet Commonly known as: DITROPAN-XL Take 5 mg by mouth daily.   simvastatin 40 MG tablet Commonly known as: ZOCOR Take 40 mg by mouth daily.      Follow-up Information    [facility] Follow up.   Why: Office will contact you on Tuesday with an appointment Contact information: [address]  [place] [phone]       Benita Stabile, MD. Schedule an appointment as soon as possible for a visit in 1 week(s).   Specialty: Internal Medicine Contact information: [address] [place] [address] [phone]          Allergies  Allergen Reactions  . Ciprofloxacin Hives and Swelling  . Penicillins Anaphylaxis and Rash  . Codeine Other (See Comments)    REACTION:  had hallicunations  . Morphine Nausea And Vomiting and Other (See Comments)    REACTION:   recieved in ED due to [facility] and had multple doses - gave visual hallucinations and vomitting.  . Sulfonamide Derivatives Other (See Comments)    REACTION: Unsure - childhood allergy  . Vancomycin Itching   Allergies as of 02/26/2020      Reactions   Ciprofloxacin Hives, Swelling   Penicillins Anaphylaxis, Rash   Codeine Other (See Comments)   REACTION:  had hallicunations   Morphine Nausea And Vomiting, Other (See Comments)   REACTION:   recieved in ED due to [facility] and had multple doses - gave visual hallucinations and vomitting.   Sulfonamide Derivatives Other (See Comments)   REACTION: Unsure -  childhood allergy   Vancomycin Itching      Medication List    STOP taking these medications   escitalopram 20 MG tablet Commonly known as: LEXAPRO     TAKE these medications   albuterol 108 (90 Base) MCG/ACT inhaler Commonly known as: VENTOLIN HFA Inhale 2 puffs into the lungs every 6 (six) hours as needed for wheezing or shortness of breath.   ALPRAZolam 1 MG tablet Commonly known as: XANAX Take 1 mg by mouth at bedtime.   ARIPiprazole 2 MG tablet Commonly known as: ABILIFY Take 2 mg by mouth at bedtime.   buPROPion 150 MG 24 hr tablet Commonly known as: WELLBUTRIN XL Take 150 mg by mouth daily.   cefdinir 300 MG capsule Commonly known as: OMNICEF Take 2 capsules (600 mg total) by mouth daily for 3 days.   DULoxetine 60 MG capsule Commonly known as: CYMBALTA Take 60 mg by mouth 2 (two) times daily.   fluconazole 150 MG tablet Commonly known as: DIFLUCAN Take 1 tablet (150 mg total) by mouth once as needed for up to 1 dose. Repeat if needed   furosemide 20 MG tablet Commonly known as: LASIX Take 40 mg by mouth daily.   gabapentin 600 MG tablet Commonly known as: NEURONTIN Take 600 mg by mouth 3 (three) times daily.   HumaLOG 100 UNIT/ML injection Generic drug: insulin lispro Inject into the skin continuous. 3.5 unirs every 1 hr-insulin pump   LORazepam 0.5 MG tablet Commonly known as: ATIVAN Take 0.5 mg by mouth daily as needed for anxiety.   multivitamin with minerals Tabs tablet Take 1 tablet by mouth daily.   omeprazole 40 MG capsule Commonly known as: PRILOSEC Take 40 mg by mouth daily.   oxybutynin 5 MG 24 hr tablet Commonly known as: DITROPAN-XL Take 5 mg by mouth daily.   simvastatin 40 MG tablet Commonly known as: ZOCOR Take 40 mg by mouth daily.       Procedures/Studies: CT ABDOMEN PELVIS W CONTRAST  Result Date: 02/25/2020 CLINICAL DATA:  Unintentional 35 pound weight loss since 08/2019, patient diagnosed with COVID-19 in  September 2020. Abdominal distension. EXAM: CT ABDOMEN AND PELVIS WITH CONTRAST TECHNIQUE: Multidetector CT imaging of the abdomen and pelvis was performed using the standard protocol following bolus administration of  intravenous contrast. CONTRAST:  OMNIPAQUE IOHEXOL 300 MG/ML  SOLN COMPARISON:  09/21/2014 FINDINGS: Lower chest: Clear lung bases.  Heart normal in size. Hepatobiliary: Liver mildly enlarged, 25 cm transversely. Diffusely decreased attenuation of the liver is noted consistent with fatty infiltration. No mass or focal lesion. Status post cholecystectomy. No bile duct dilation Pancreas: Unremarkable. No pancreatic ductal dilatation or surrounding inflammatory changes. Spleen: Normal in size without focal abnormality. Adrenals/Urinary Tract: 7 mm left adrenal nodule, stable consistent with an adenoma. Normal right adrenal gland. Moderate to marked right hydronephrosis and hydroureter. This is due to a 6 mm stone in the distal ureter. Mild right renal cortical thinning. There are adjacent stones in the lower pole the right kidney with a combined greatest dimension of 11 mm. Nonobstructing stones are noted in the upper lower pole of the left kidney. No renal masses. Symmetric renal enhancement and excretion. No left hydronephrosis. Normal left ureter. Normal bladder. Stomach/Bowel: Stomach is within normal limits. Appendix appears normal. No evidence of bowel wall thickening, distention, or inflammatory changes. No bowel mass visualized. Vascular/Lymphatic: Minimal aortic atherosclerosis. No aneurysm. No enlarged lymph nodes. Reproductive: Uterus and bilateral adnexa are unremarkable. Other: No abdominal wall hernia or abnormality. No abdominopelvic ascites. Musculoskeletal: Chronic bilateral pars defects at L5-S1 with a minor anterolisthesis. No acute fracture. No osteoblastic or osteolytic lesions. IMPRESSION: 1. 6 mm stone in the distal right ureter causes moderate to marked right  hydroureteronephrosis. 2. No other acute abnormality. Bilateral nonobstructing intrarenal stones. 3. Mild hepatomegaly and diffuse hepatic steatosis. Electronically Signed   By: Amie Portland M.D.   On: 02/25/2020 16:55   DG Retrograde Pyelogram  Result Date: 02/25/2020 CLINICAL DATA:  Stent placement EXAM: RETROGRADE PYELOGRAM COMPARISON:  CT dated March 18th 2020 FINDINGS: The patient has undergone right-sided ureteral stent placement. The proximal aspect of the stent is noted projecting over the renal pelvis. Contrast is noted within the dilated right renal collecting system. The distal aspect of the stent is not visualized on this study. The total fluoroscopy time was 13.6 seconds. One image was submitted for evaluation. IMPRESSION: Status post stent placement as above. Electronically Signed   By: Katherine Mantle M.D.   On: 02/25/2020 22:44     Subjective: Patient reports that she is feeling a lot better today.  She is eating and drinking well.  No fever or chills.  Discharge Exam: Vitals:   02/26/20 1130 02/26/20 1200  BP: 140/68   Pulse: 91   Resp: 18   Temp:  98.1 F (36.7 C)  SpO2: 96%    Vitals:   02/26/20 1000 02/26/20 1100 02/26/20 1130 02/26/20 1200  BP: 139/90 130/69 140/68   Pulse: 84 79 91   Resp: (!) 21 17 18    Temp:    98.1 F (36.7 C)  TempSrc:    Oral  SpO2: 98% 93% 96%   Weight:      Height:       General: Pt is alert, awake, not in acute distress Cardiovascular: RRR, S1/S2 +, no rubs, no gallops Respiratory: CTA bilaterally, no wheezing, no rhonchi Abdominal: Soft, NT, ND, bowel sounds + Extremities: no edema, no cyanosis   The results of significant diagnostics from this hospitalization (including imaging, microbiology, ancillary and laboratory) are listed below for reference.     Microbiology: Recent Results (from the past 240 hour(s))  Blood culture (routine x 2)     Status: None (Preliminary result)   Collection Time: 02/25/20  4:47 PM  Specimen: BLOOD RIGHT HAND  Result Value Ref Range Status   Specimen Description BLOOD RIGHT HAND  Final   Special Requests   Final    BOTTLES DRAWN AEROBIC AND ANAEROBIC Blood Culture adequate volume   Culture   Final    NO GROWTH < 24 HOURS Performed at [facility], [address], [place], [place] [address]    Report Status PENDING  Incomplete  Blood culture (routine x 2)     Status: None (Preliminary result)   Collection Time: 02/25/20  4:48 PM   Specimen: Left Antecubital; Blood  Result Value Ref Range Status   Specimen Description LEFT ANTECUBITAL  Final   Special Requests   Final    BOTTLES DRAWN AEROBIC AND ANAEROBIC Blood Culture adequate volume   Culture   Final    NO GROWTH < 24 HOURS Performed at [facility], [address], [place], [place] [address]    Report Status PENDING  Incomplete  Respiratory Panel by RT PCR (Flu A&B, Covid) - Nasopharyngeal Swab     Status: None   Collection Time: 02/25/20  6:21 PM   Specimen: Nasopharyngeal Swab  Result Value Ref Range Status   SARS Coronavirus 2 by RT PCR NEGATIVE NEGATIVE Final    Comment: (NOTE) SARS-CoV-2 target nucleic acids are NOT DETECTED. The SARS-CoV-2 RNA is generally detectable in upper respiratoy specimens during the acute phase of infection. The lowest concentration of SARS-CoV-2 viral copies this assay can detect is 131 copies/mL. A negative result does not preclude SARS-Cov-2 infection and should not be used as the sole basis for treatment or other patient management decisions. A negative result may occur with  improper specimen collection/handling, submission of specimen other than nasopharyngeal swab, presence of viral mutation(s) within the areas targeted by this assay, and inadequate number of viral copies (<131 copies/mL). A negative result must be combined with clinical observations, patient history, and epidemiological information. The expected result is Negative. Fact Sheet for Patients:   https://www.moore.com/ Fact Sheet for Healthcare Providers:  https://www.young.biz/ This test is not yet ap proved or cleared by the [place] FDA and  has been authorized for detection and/or diagnosis of SARS-CoV-2 by FDA under an Emergency Use Authorization (EUA). This EUA will remain  in effect (meaning this test can be used) for the duration of the COVID-19 declaration under Section 564(b)(1) of the Act, 21 U.S.C. section 360bbb-3(b)(1), unless the authorization is terminated or revoked sooner.    Influenza A by PCR NEGATIVE NEGATIVE Final   Influenza B by PCR NEGATIVE NEGATIVE Final    Comment: (NOTE) The Xpert Xpress SARS-CoV-2/FLU/RSV assay is intended as an aid in  the diagnosis of influenza from Nasopharyngeal swab specimens and  should not be used as a sole basis for treatment. Nasal washings and  aspirates are unacceptable for Xpert Xpress SARS-CoV-2/FLU/RSV  testing. Fact Sheet for Patients: https://www.moore.com/ Fact Sheet for Healthcare Providers: https://www.young.biz/ This test is not yet approved or cleared by the [place] FDA and  has been authorized for detection and/or diagnosis of SARS-CoV-2 by  FDA under an Emergency Use Authorization (EUA). This EUA will remain  in effect (meaning this test can be used) for the duration of the  Covid-19 declaration under Section 564(b)(1) of the Act, 21  U.S.C. section 360bbb-3(b)(1), unless the authorization is  terminated or revoked. Performed at [facility], [address], [place], [place] [address]   MRSA PCR Screening     Status: None   Collection Time: 02/25/20 10:19  PM   Specimen: Nasopharyngeal  Result Value Ref Range Status   MRSA by PCR NEGATIVE NEGATIVE Final    Comment:        The GeneXpert MRSA Assay (FDA approved for NASAL specimens only), is one component of a comprehensive MRSA colonization surveillance program. It  is not intended to diagnose MRSA infection nor to guide or monitor treatment for MRSA infections. Performed at [facility], [address], [place], [place] [address]      Labs: BNP (last 3 results) No results for input(s): BNP in the last 8760 hours. Basic Metabolic Panel: Recent Labs  Lab 02/25/20 1325 02/25/20 1519 02/25/20 2334 02/26/20 0436 02/26/20 0730  NA 127* 131* 136 136  --   K 2.7* 3.1* 2.7* 3.1*  --   CL 82* 90* 97* 99  --   CO2 27 28 29 27   --   GLUCOSE 775* 566* 319* 264*  --   BUN 8 9 7 6   --   CREATININE 1.09* 0.91 0.72 0.72  --   CALCIUM 8.9 8.4* 7.7* 7.9*  --   MG  --   --   --   --  1.8   Liver Function Tests: Recent Labs  Lab 02/25/20 1325 02/26/20 0436  AST 68* 56*  ALT 66* 48*  ALKPHOS 161* 109  BILITOT 0.8 0.4  PROT 7.4 6.0*  ALBUMIN 3.5 2.9*   Recent Labs  Lab 02/25/20 1325  LIPASE 40   No results for input(s): AMMONIA in the last 168 hours. CBC: Recent Labs  Lab 02/25/20 1325 02/26/20 0436  WBC 13.7* 9.5  NEUTROABS 10.8* 5.5  HGB 14.4 12.5  HCT 43.3 37.9  MCV 83.9 85.9  PLT 380 276   Cardiac Enzymes: No results for input(s): CKTOTAL, CKMB, CKMBINDEX, TROPONINI in the last 168 hours. BNP: Invalid input(s): POCBNP CBG: Recent Labs  Lab 02/25/20 2119 02/25/20 2251 02/26/20 0314 02/26/20 0717 02/26/20 1153  GLUCAP 214* 296* 260* 173* 179*   D-Dimer No results for input(s): DDIMER in the last 72 hours. Hgb A1c Recent Labs    02/25/20 1325  HGBA1C 15.1*   Lipid Profile No results for input(s): CHOL, HDL, LDLCALC, TRIG, CHOLHDL, LDLDIRECT in the last 72 hours. Thyroid function studies No results for input(s): TSH, T4TOTAL, T3FREE, THYROIDAB in the last 72 hours.  Invalid input(s): FREET3 Anemia work up No results for input(s): VITAMINB12, FOLATE, FERRITIN, TIBC, IRON, RETICCTPCT in the last 72 hours. Urinalysis    Component Value Date/Time   COLORURINE YELLOW 02/25/2020 1327   APPEARANCEUR CLEAR  02/25/2020 1327   LABSPEC 1.023 02/25/2020 1327   PHURINE 7.0 02/25/2020 1327   GLUCOSEU >=500 (A) 02/25/2020 1327   HGBUR MODERATE (A) 02/25/2020 1327   HGBUR moderate 08/31/2008 0908   BILIRUBINUR NEGATIVE 02/25/2020 1327   KETONESUR NEGATIVE 02/25/2020 1327   PROTEINUR NEGATIVE 02/25/2020 1327   UROBILINOGEN 0.2 09/21/2014 1055   NITRITE NEGATIVE 02/25/2020 1327   LEUKOCYTESUR NEGATIVE 02/25/2020 1327   Sepsis Labs Invalid input(s): PROCALCITONIN,  WBC,  LACTICIDVEN Microbiology Recent Results (from the past 240 hour(s))  Blood culture (routine x 2)     Status: None (Preliminary result)   Collection Time: 02/25/20  4:47 PM   Specimen: BLOOD RIGHT HAND  Result Value Ref Range Status   Specimen Description BLOOD RIGHT HAND  Final   Special Requests   Final    BOTTLES DRAWN AEROBIC AND ANAEROBIC Blood Culture adequate volume   Culture   Final    NO  GROWTH < 24 HOURS Performed at [facility], [address], [place], [place] [address]    Report Status PENDING  Incomplete  Blood culture (routine x 2)     Status: None (Preliminary result)   Collection Time: 02/25/20  4:48 PM   Specimen: Left Antecubital; Blood  Result Value Ref Range Status   Specimen Description LEFT ANTECUBITAL  Final   Special Requests   Final    BOTTLES DRAWN AEROBIC AND ANAEROBIC Blood Culture adequate volume   Culture   Final    NO GROWTH < 24 HOURS Performed at [facility], [address] [place], [place] [address]    Report Status PENDING  Incomplete  Respiratory Panel by RT PCR (Flu A&B, Covid) - Nasopharyngeal Swab     Status: None   Collection Time: 02/25/20  6:21 PM   Specimen: Nasopharyngeal Swab  Result Value Ref Range Status   SARS Coronavirus 2 by RT PCR NEGATIVE NEGATIVE Final    Comment: (NOTE) SARS-CoV-2 target nucleic acids are NOT DETECTED. The SARS-CoV-2 RNA is generally detectable in upper respiratoy specimens during the acute phase of infection. The lowest concentration of  SARS-CoV-2 viral copies this assay can detect is 131 copies/mL. A negative result does not preclude SARS-Cov-2 infection and should not be used as the sole basis for treatment or other patient management decisions. A negative result may occur with  improper specimen collection/handling, submission of specimen other than nasopharyngeal swab, presence of viral mutation(s) within the areas targeted by this assay, and inadequate number of viral copies (<131 copies/mL). A negative result must be combined with clinical observations, patient history, and epidemiological information. The expected result is Negative. Fact Sheet for Patients:  https://www.moore.com/ Fact Sheet for Healthcare Providers:  https://www.young.biz/ This test is not yet ap proved or cleared by the [place] FDA and  has been authorized for detection and/or diagnosis of SARS-CoV-2 by FDA under an Emergency Use Authorization (EUA). This EUA will remain  in effect (meaning this test can be used) for the duration of the COVID-19 declaration under Section 564(b)(1) of the Act, 21 U.S.C. section 360bbb-3(b)(1), unless the authorization is terminated or revoked sooner.    Influenza A by PCR NEGATIVE NEGATIVE Final   Influenza B by PCR NEGATIVE NEGATIVE Final    Comment: (NOTE) The Xpert Xpress SARS-CoV-2/FLU/RSV assay is intended as an aid in  the diagnosis of influenza from Nasopharyngeal swab specimens and  should not be used as a sole basis for treatment. Nasal washings and  aspirates are unacceptable for Xpert Xpress SARS-CoV-2/FLU/RSV  testing. Fact Sheet for Patients: https://www.moore.com/ Fact Sheet for Healthcare Providers: https://www.young.biz/ This test is not yet approved or cleared by the [place] FDA and  has been authorized for detection and/or diagnosis of SARS-CoV-2 by  FDA under an Emergency Use Authorization (EUA).  This EUA will remain  in effect (meaning this test can be used) for the duration of the  Covid-19 declaration under Section 564(b)(1) of the Act, 21  U.S.C. section 360bbb-3(b)(1), unless the authorization is  terminated or revoked. Performed at [facility], [address] [place], [place] [address]   MRSA PCR Screening     Status: None   Collection Time: 02/25/20 10:19 PM   Specimen: Nasopharyngeal  Result Value Ref Range Status   MRSA by PCR NEGATIVE NEGATIVE Final    Comment:        The GeneXpert MRSA Assay (FDA approved for NASAL specimens only), is one component of a comprehensive MRSA  colonization surveillance program. It is not intended to diagnose MRSA infection nor to guide or monitor treatment for MRSA infections. Performed at [facility], [address], [place], [place] [address]    Time coordinating discharge:   SIGNED:  Standley Dakins, MD  [facility] 02/26/2020, 2:01 PM How to contact the [facility] Attending or Consulting provider 7A - 7P or covering provider during after hours 7P -7A, for this patient?  1. Check the care team in [facility] and look for a) attending/consulting TRH provider listed and b) the [facility] team listed 2. Log into [email or web address] and use [place]'s universal password to access. If you do not have the password, please contact the hospital operator. 3. Locate the [facility] provider you are looking for under [facility] and page to a number that you can be directly reached. 4. If you still have difficulty reaching the provider, please page the [facility] (Director on Call) for the Hospitalists listed on amion for assistance.

## 2020-03-01 LAB — CULTURE, BLOOD (ROUTINE X 2)
Culture: NO GROWTH
Culture: NO GROWTH
Special Requests: ADEQUATE
Special Requests: ADEQUATE

## 2020-03-09 ENCOUNTER — Ambulatory Visit (INDEPENDENT_AMBULATORY_CARE_PROVIDER_SITE_OTHER): Payer: No Typology Code available for payment source | Admitting: Nurse Practitioner

## 2020-03-09 ENCOUNTER — Encounter: Payer: Self-pay | Admitting: Nurse Practitioner

## 2020-03-09 ENCOUNTER — Other Ambulatory Visit: Payer: Self-pay

## 2020-03-09 ENCOUNTER — Encounter: Payer: Self-pay | Admitting: Internal Medicine

## 2020-03-09 VITALS — BP 153/88 | HR 102 | Temp 97.0°F | Ht <= 58 in | Wt 211.2 lb

## 2020-03-09 DIAGNOSIS — R1310 Dysphagia, unspecified: Secondary | ICD-10-CM | POA: Diagnosis not present

## 2020-03-09 DIAGNOSIS — K219 Gastro-esophageal reflux disease without esophagitis: Secondary | ICD-10-CM | POA: Insufficient documentation

## 2020-03-09 DIAGNOSIS — R1319 Other dysphagia: Secondary | ICD-10-CM

## 2020-03-09 DIAGNOSIS — Z0000 Encounter for general adult medical examination without abnormal findings: Secondary | ICD-10-CM

## 2020-03-09 HISTORY — DX: Dysphagia, unspecified: R13.10

## 2020-03-09 NOTE — Progress Notes (Signed)
Referring Provider: Benita Stabile, MD Primary Care Physician:  Benita Stabile, MD Primary GI:  Dr. Jena Gauss  Chief Complaint  Patient presents with  . TCS    needs rescheduled  . Dysphagia    food feels like it gets stuck  . burping    HPI:   [name] is a 54 y.o. female who presents to reschedule colonoscopy.  The patient was last seen in our office 07/15/2019 to schedule a colonoscopy.  At that time nurse/phone triage was deferred to office visit due to likely need for augmented sedation due to medications.  No previous colonoscopy.  Noted family history of polyps in her father.  Generally asymptomatic from a GI standpoint.  Diabetic and wears an insulin pump.  Recommended proceed with colonoscopy and follow-up based on post procedure recommendations.  Unfortunately the patient tested positive for Covid on 07/25/2019.  Her procedure was scheduled for 10/6 and indicated she would not need to be retested due to recent positive test, if she remains asymptomatic.  However she decided to cancel her procedure and wait until December.  She was rescheduled for 11/19/2019 at 8:15 AM.  She again called to reschedule to January.  Her procedure was changed to 12/31/2019 at 11 AM.  After the first of the year she had new insurance that required a prior authorization.  Insurance decided because it was a screening procedure it was not urgent and would take at least 15 days to render a decision.  The patient's appointment for her colonoscopy was just a few days later so it was decided to cancel at that time.  Recommended reschedule office visit to reschedule her procedure.  Today she states she's doing ok overall. Needs to have colonoscopy rescheduled. Is also having solid food dysphagia which started in September after COVID-19 diagnosis and occurs about with 2 of 3 melas. Trying to eat softer foods, chewing adequately. Has all her teeth. Initially started tapering but then got worse. Had emergency surgery  to put a "stent in my kidneys due to a kidney stone."  Admits GERD symptoms including esophageal burning and bitter taste. Was started on omeprazole a couple days ago. Also with a lot of belching. Has intermittent nausea and a couple episodes of vomiting. Denies other abdominal pain, hematochezia, melena, fever, chills. Admits 21 lb weight loss since December (subjectively) and was having altered taste (? COVID-19 lingering symptom). Objectively she is down 20 last since last August. Denies URI or flu-like symptoms. Denies loss of sense of taste or smell. Has received both doses of COVID-19 vaccine. Denies chest pain, dyspnea, dizziness, lightheadedness, syncope, near syncope. Denies any other upper or lower GI symptoms.  Past Medical History:  Diagnosis Date  . Diabetes mellitus    insulin pump 2013  . Hypercholesteremia   . Hypertension   . Kidney stone   . Neuropathy   . Obesity   . Obstructive sleep apnea   . Palpitations   . Shortness of breath     Past Surgical History:  Procedure Laterality Date  . CARDIAC CATHETERIZATION  12/11/2012   normal coronary arteries  . CESAREAN SECTION  1994;2000   [facility]  . CHOLECYSTECTOMY  1992  . CYSTOSCOPY W/ URETERAL STENT PLACEMENT  05/08/2012   Procedure: CYSTOSCOPY WITH RETROGRADE PYELOGRAM/URETERAL STENT PLACEMENT;  Surgeon: Ky Barban, MD;  Location: [facility];  Service: Urology;  Laterality: Right;  . CYSTOSCOPY WITH STENT PLACEMENT Right 02/25/2020   Procedure: CYSTOSCOPY WITH RIGHT RETROGRADE RIGHT  STENT PLACEMENT;  Surgeon: Festus Aloe, MD;  Location: [facility];  Service: Urology;  Laterality: Right;  . FOOT SURGERY Right March, 2013   [facility]-removal of bone spur  . HYSTEROSCOPY WITH THERMACHOICE  04/25/2012   Procedure: HYSTEROSCOPY WITH THERMACHOICE;  Surgeon: Florian Buff, MD;  Location: [facility];  Service: Gynecology;  Laterality: N/A;  total therapy time= 11 minutes 42 seconds; 34 ml D5w  in and 34 ml D5w out; temp =87  degrees F  . LEFT HEART CATHETERIZATION WITH CORONARY ANGIOGRAM N/A 12/11/2012   Procedure: LEFT HEART CATHETERIZATION WITH CORONARY ANGIOGRAM;  Surgeon: Lorretta Harp, MD;  Location: [facility] CATH LAB;  Service: Cardiovascular;  Laterality: N/A;  . Sinus [place]  . UMBILICAL HERNIA REPAIR N/A 03/25/2014   Procedure: UMBILICAL HERNIA REPAIR;  Surgeon: Scherry Ran, MD;  Location: [facility];  Service: General;  Laterality: N/A;  . uterine ablation      Current Outpatient Medications  Medication Sig Dispense Refill  . albuterol (VENTOLIN HFA) 108 (90 Base) MCG/ACT inhaler Inhale 2 puffs into the lungs every 6 (six) hours as needed for wheezing or shortness of breath.     . ALPRAZolam (XANAX) 1 MG tablet Take 1 mg by mouth at bedtime.   2  . ARIPiprazole (ABILIFY) 2 MG tablet Take 2 mg by mouth at bedtime.     Marland Kitchen buPROPion (WELLBUTRIN XL) 150 MG 24 hr tablet Take 150 mg by mouth daily.   2  . DULoxetine (CYMBALTA) 60 MG capsule Take 60 mg by mouth 2 (two) times daily.   2  . furosemide (LASIX) 20 MG tablet Take 40 mg by mouth daily.    Marland Kitchen gabapentin (NEURONTIN) 600 MG tablet Take 600 mg by mouth 3 (three) times daily.     Marland Kitchen HUMALOG 100 UNIT/ML injection Inject into the skin continuous. 3.5 unirs every 1 hr-insulin pump    . LORazepam (ATIVAN) 0.5 MG tablet Take 0.5 mg by mouth daily as needed for anxiety.   0  . Multiple Vitamin (MULTIVITAMIN WITH MINERALS) TABS tablet Take 1 tablet by mouth daily.    Marland Kitchen omeprazole (PRILOSEC) 40 MG capsule Take 40 mg by mouth daily.    . simvastatin (ZOCOR) 40 MG tablet Take 40 mg by mouth daily.      No current facility-administered medications for this visit.    Allergies as of 03/09/2020 - Review Complete 03/09/2020  Allergen Reaction Noted  . Ciprofloxacin Hives and Swelling 04/25/2012  . Penicillins Anaphylaxis and Rash 08/31/2008  . Codeine Other (See Comments) 08/31/2008  . Morphine Nausea And Vomiting and Other (See Comments)  08/31/2008  . Sulfonamide derivatives Other (See Comments) 08/31/2008  . Vancomycin Itching 02/19/2014    Family History  Problem Relation Age of Onset  . Diabetes Mother   . Depression Paternal Grandmother   . Depression Paternal Grandfather   . Heart disease Other   . Cancer Other   . Diabetes Other   . Achalasia Other   . Anesthesia problems Neg Hx   . Hypotension Neg Hx   . Malignant hyperthermia Neg Hx   . Pseudochol deficiency Neg Hx     Social History   Socioeconomic History  . Marital status: Married    Spouse name: Not on file  . Number of children: 2  . Years of education: college  . Highest education level: Not on file  Occupational History    Employer: [employer]    Employer: [employer]  Tobacco Use  . Smoking status: Never Smoker  . Smokeless tobacco: Never Used  Substance and Sexual Activity  . Alcohol use: No  . Drug use: No  . Sexual activity: Yes    Partners: Male    Comment: ablation  Other Topics Concern  . Not on file  Social History Narrative   Regular exercise: walks    Caffeine use: not regularly   Employed and full time student at [employer]   Social Determinants of Health   Financial Resource Strain:   . Difficulty of Paying Living Expenses:   Food Insecurity:   . Worried About Programme researcher, broadcasting/film/video in the Last Year:   . Barista in the Last Year:   Transportation Needs:   . Freight forwarder (Medical):   Marland Kitchen Lack of Transportation (Non-Medical):   Physical Activity:   . Days of Exercise per Week:   . Minutes of Exercise per Session:   Stress:   . Feeling of Stress :   Social Connections:   . Frequency of Communication with Friends and Family:   . Frequency of Social Gatherings with Friends and Family:   . Attends Religious Services:   . Active Member of Clubs or Organizations:   . Attends Banker Meetings:   Marland Kitchen Marital Status:     Subjective: Review of Systems  Constitutional: Positive for weight  loss. Negative for chills, fever and malaise/fatigue.  HENT: Negative for congestion and sore throat.   Respiratory: Negative for cough and shortness of breath.   Cardiovascular: Negative for chest pain and palpitations.  Gastrointestinal: Positive for heartburn. Negative for abdominal pain, blood in stool, diarrhea, melena, nausea and vomiting.       Solid food dysphagia  Musculoskeletal: Negative for joint pain and myalgias.  Skin: Negative for rash.  Neurological: Negative for dizziness and weakness.  Endo/Heme/Allergies: Does not bruise/bleed easily.  Psychiatric/Behavioral: Negative for depression. The patient is not nervous/anxious.   All other systems reviewed and are negative.    Objective: BP (!) 153/88   Pulse (!) 102   Temp (!) 97 F (36.1 C) (Oral)   Ht 4\' 8"  (1.422 m)   Wt 211 lb 3.2 oz (95.8 kg)   BMI 47.35 kg/m  Physical Exam Vitals and nursing note reviewed.  Constitutional:      General: She is not in acute distress.    Appearance: Normal appearance. She is well-developed. She is obese. She is not ill-appearing, toxic-appearing or diaphoretic.  HENT:     Head: Normocephalic and atraumatic.  Cardiovascular:     Rate and Rhythm: Normal rate and regular rhythm.     Heart sounds: Normal heart sounds.  Pulmonary:     Effort: Pulmonary effort is normal. No respiratory distress.     Breath sounds: Normal breath sounds.  Abdominal:     General: Abdomen is protuberant. Bowel sounds are normal.     Palpations: Abdomen is soft. There is no hepatomegaly, splenomegaly or mass.     Tenderness: There is generalized abdominal tenderness. There is no guarding or rebound.     Hernia: No hernia is present.     Comments: Mild generalized TTP  Skin:    General: Skin is warm and dry.     Coloration: Skin is not jaundiced.     Findings: No rash.  Neurological:     General: No focal deficit present.     Mental Status: She is alert and oriented to person, place,  and time.    Psychiatric:        Attention and Perception: Attention normal.        Mood and Affect: Mood normal.        Speech: Speech normal.        Behavior: Behavior normal.        Thought Content: Thought content normal.        Cognition and Memory: Cognition and memory normal.       03/09/2020 2:50 PM   Disclaimer: This note was dictated with voice recognition software. Similar sounding words can inadvertently be transcribed and may not be corrected upon review.

## 2020-03-09 NOTE — Progress Notes (Signed)
Cc'ed to pcp °

## 2020-03-09 NOTE — Assessment & Plan Note (Addendum)
New onset solid food dysphagia since September when she was diagnosed with COVID-19.  Her symptoms are lingering.  Associated with GERD as per above.  At this point I have recommended chewing and swallowing precautions.  We will plan for an upper endoscopy with possible dilation to further evaluate and treat her symptoms.  Follow-up in 3 months.  Proceed with EGD +/- dilation on propofol/MAC with Dr. Gala Romney in near future: the risks, benefits, and alternatives have been discussed with the patient in detail. The patient states understanding and desires to proceed.  The patient is currently on Xanax, Abilify, Wellbutrin, Cymbalta, Neurontin, Ativan. The patient is not on any other anticoagulants, anxiolytics, chronic pain medications, antidepressants, antidiabetics, or iron supplements.  She does have insulin but has an insulin pump which should not require titration of dose/parameters.

## 2020-03-09 NOTE — Assessment & Plan Note (Signed)
The patient has significant GERD symptoms.  She was started on omeprazole prior to hospitalization and ureteral stent placement for kidney stone.  She just now restarted her omeprazole about 2 days ago.  No significant improvement yet but likely not had enough time to become systemic.  Recommend she continue her PPI currently.  Use Tums for rescue medication.  Call in 2 to 4 weeks and let us know if no improvement at which point we can consider increasing her dose or changing her medication.  EGD as per below for solid food dysphagia it should also shed some light on possible esophagitis or gastritis.

## 2020-03-09 NOTE — Patient Instructions (Signed)
Your health issues we discussed today were:   GERD (reflux/heartburn) with dysphagia (swallowing difficulties): 1. Continue taking your omeprazole as recommended by your primary care 2. Call us in 3 to 4 weeks and let us know if you are still having reflux symptoms at which point we can increase your dose or change your medication 3. You can use Tums as needed for "breakthrough" reflux symptoms 4. We will schedule an upper endoscopy with possible dilation to evaluate your GERD and swallowing difficulties and possibly treat your swallowing difficulties 5. Call us if you have any worsening or severe symptoms 6. Further information below relating to chewing and swallowing precautions  Need for colonoscopy: 1. We will schedule your colonoscopy for you 2. Further recommendations will follow your colonoscopy  Overall I recommend:  1. Continue your other current medications 2. Return for follow-up in 4 months 3. Call us if you have any questions or concerns   ---------------------------------------------------------------  I am glad you have received your COVID-19 vaccines!  Even though you are vaccinated, you should continue to wear a mask, socially distance from people that you do not live with her closely associated with regularly, and continue to wash your hands frequently  ---------------------------------------------------------------   At [facility] we value your feedback. You may receive a survey about your visit today. Please share your experience as we strive to create trusting relationships with our patients to provide genuine, compassionate, quality care.  We appreciate your understanding and patience as we review any laboratory studies, imaging, and other diagnostic tests that are ordered as we care for you. Our office policy is 5 business days for review of these results, and any emergent or urgent results are addressed in a timely manner for your best interest.  If you do not hear from our office in 1 week, please contact us.   We also encourage the use of MyChart, which contains your medical information for your review as well. If you are not enrolled in this feature, an access code is on this after visit summary for your convenience. Thank you for allowing [place] to be involved in your care.  It was great to see you today!  I hope you have a great day!!

## 2020-03-09 NOTE — Assessment & Plan Note (Addendum)
Currently due for screening colonoscopy.  She was previously scheduled for the beginning of the year however this had to be rescheduled due to new insurance requiring prior authorization and requiring at least 2 weeks to make a determination.  Generally asymptomatic from a lower GI standpoint.  We will proceed with colonoscopy at this time.  Proceed with TCS on propofol/MAC with Dr. Gala Romney in near future: the risks, benefits, and alternatives have been discussed with the patient in detail. The patient states understanding and desires to proceed.  The patient is currently on Xanax, Abilify, Wellbutrin, Cymbalta, Neurontin, Ativan. The patient is not on any other anticoagulants, anxiolytics, chronic pain medications, antidepressants, antidiabetics, or iron supplements.  She does have insulin but has an insulin pump which should not require titration of dose/parameters.

## 2020-03-16 ENCOUNTER — Ambulatory Visit (INDEPENDENT_AMBULATORY_CARE_PROVIDER_SITE_OTHER): Payer: No Typology Code available for payment source | Admitting: Urology

## 2020-03-16 ENCOUNTER — Encounter: Payer: Self-pay | Admitting: Urology

## 2020-03-16 ENCOUNTER — Other Ambulatory Visit: Payer: Self-pay

## 2020-03-16 VITALS — BP 135/83 | HR 80 | Temp 97.9°F | Ht <= 58 in | Wt 201.0 lb

## 2020-03-16 DIAGNOSIS — N200 Calculus of kidney: Secondary | ICD-10-CM

## 2020-03-16 LAB — POCT URINALYSIS DIPSTICK
Bilirubin, UA: NEGATIVE
Glucose, UA: NEGATIVE
Ketones, UA: NEGATIVE
Nitrite, UA: NEGATIVE
Protein, UA: POSITIVE — AB
Spec Grav, UA: 1.01 (ref 1.010–1.025)
Urobilinogen, UA: 0.2 E.U./dL
pH, UA: 7.5 (ref 5.0–8.0)

## 2020-03-16 NOTE — Patient Instructions (Signed)

## 2020-03-16 NOTE — Progress Notes (Signed)
03/16/2020 2:19 PM   [name] 07/20/1966 [id number]  Referring provider: Benita Stabile, MD [address] [place],  [place] [address]  Nephrolithiasis  HPI: Ms Chernick is a 53yo here for followup for nephrolithiasis. She underwent right ureteral stent placement on 3/18 for sepsis from a right ureteral calculus. She has mild LUTS, no flank pain   PMH: Past Medical History:  Diagnosis Date  . Diabetes mellitus    insulin pump 2013  . Hypercholesteremia   . Hypertension   . Kidney stone   . Neuropathy   . Obesity   . Obstructive sleep apnea   . Palpitations   . Shortness of breath     Surgical History: Past Surgical History:  Procedure Laterality Date  . CARDIAC CATHETERIZATION  12/11/2012   normal coronary arteries  . CESAREAN SECTION  1994;2000   [facility]  . CHOLECYSTECTOMY  1992  . CYSTOSCOPY W/ URETERAL STENT PLACEMENT  05/08/2012   Procedure: CYSTOSCOPY WITH RETROGRADE PYELOGRAM/URETERAL STENT PLACEMENT;  Surgeon: Ky Barban, MD;  Location: [facility];  Service: Urology;  Laterality: Right;  . CYSTOSCOPY WITH STENT PLACEMENT Right 02/25/2020   Procedure: CYSTOSCOPY WITH RIGHT RETROGRADE RIGHT STENT PLACEMENT;  Surgeon: Jerilee Field, MD;  Location: [facility];  Service: Urology;  Laterality: Right;  . FOOT SURGERY Right March, 2013   [facility]-removal of bone spur  . HYSTEROSCOPY WITH THERMACHOICE  04/25/2012   Procedure: HYSTEROSCOPY WITH THERMACHOICE;  Surgeon: Lazaro Arms, MD;  Location: [facility];  Service: Gynecology;  Laterality: N/A;  total therapy time= 11 minutes 42 seconds; 34 ml D5w  in and 34 ml D5w out; temp =87 degrees F  . LEFT HEART CATHETERIZATION WITH CORONARY ANGIOGRAM N/A 12/11/2012   Procedure: LEFT HEART CATHETERIZATION WITH CORONARY ANGIOGRAM;  Surgeon: Runell Gess, MD;  Location: [facility] CATH LAB;  Service: Cardiovascular;  Laterality: N/A;  . Sinus sergery  1988   [place]  . UMBILICAL HERNIA REPAIR N/A 03/25/2014   Procedure:  UMBILICAL HERNIA REPAIR;  Surgeon: Marlane Hatcher, MD;  Location: [facility];  Service: General;  Laterality: N/A;  . uterine ablation      Home Medications:  Allergies as of 03/16/2020      Reactions   Ciprofloxacin Hives, Swelling   Penicillins Anaphylaxis, Rash   Codeine Other (See Comments)   REACTION:  had hallicunations   Morphine Nausea And Vomiting, Other (See Comments)   REACTION:   recieved in ED due to [facility] and had multple doses - gave visual hallucinations and vomitting.   Sulfonamide Derivatives Other (See Comments)   REACTION: Unsure - childhood allergy   Vancomycin Itching      Medication List       Accurate as of March 16, 2020  2:19 PM. If you have any questions, ask your nurse or doctor.        albuterol 108 (90 Base) MCG/ACT inhaler Commonly known as: VENTOLIN HFA Inhale 2 puffs into the lungs every 6 (six) hours as needed for wheezing or shortness of breath.   ALPRAZolam 1 MG tablet Commonly known as: XANAX Take 1 mg by mouth at bedtime.   ARIPiprazole 2 MG tablet Commonly known as: ABILIFY Take 2 mg by mouth at bedtime.   buPROPion 150 MG 24 hr tablet Commonly known as: WELLBUTRIN XL Take 150 mg by mouth daily.   DULoxetine 60 MG capsule Commonly known as: CYMBALTA Take 60 mg by mouth 2 (two) times daily.   furosemide 20 MG  tablet Commonly known as: LASIX Take 40 mg by mouth daily.   gabapentin 600 MG tablet Commonly known as: NEURONTIN Take 600 mg by mouth 3 (three) times daily.   HumaLOG 100 UNIT/ML injection Generic drug: insulin lispro Inject into the skin continuous. 3.5 unirs every 1 hr-insulin pump   LORazepam 0.5 MG tablet Commonly known as: ATIVAN Take 0.5 mg by mouth daily as needed for anxiety.   multivitamin with minerals Tabs tablet Take 1 tablet by mouth daily.   omeprazole 40 MG capsule Commonly known as: PRILOSEC Take 40 mg by mouth daily.   simvastatin 40 MG tablet Commonly known as: ZOCOR Take 40 mg by  mouth daily.       Allergies:  Allergies  Allergen Reactions  . Ciprofloxacin Hives and Swelling  . Penicillins Anaphylaxis and Rash  . Codeine Other (See Comments)    REACTION:  had hallicunations  . Morphine Nausea And Vomiting and Other (See Comments)    REACTION:   recieved in ED due to [facility] and had multple doses - gave visual hallucinations and vomitting.  . Sulfonamide Derivatives Other (See Comments)    REACTION: Unsure - childhood allergy  . Vancomycin Itching    Family History: Family History  Problem Relation Age of Onset  . Diabetes Mother   . Depression Paternal Grandmother   . Depression Paternal Grandfather   . Heart disease Other   . Cancer Other   . Diabetes Other   . Achalasia Other   . Anesthesia problems Neg Hx   . Hypotension Neg Hx   . Malignant hyperthermia Neg Hx   . Pseudochol deficiency Neg Hx     Social History:  reports that she has never smoked. She has never used smokeless tobacco. She reports that she does not drink alcohol or use drugs.  ROS: All other review of systems were reviewed and are negative except what is noted above in HPI  Physical Exam: BP 135/83   Pulse 80   Temp 97.9 F (36.6 C)   Ht 4\' 9"  (1.448 m)   Wt 201 lb (91.2 kg)   BMI 43.50 kg/m   Constitutional:  Alert and oriented, No acute distress. HEENT: [place] AT, moist mucus membranes.  Trachea midline, no masses. Cardiovascular: No clubbing, cyanosis, or edema. Respiratory: Normal respiratory effort, no increased work of breathing. GI: Abdomen is soft, nontender, nondistended, no abdominal masses GU: No CVA tenderness. Circumcised phallus. No masses/lesions on penis, testis, scrotum. Prostate 40g smooth no nodules no induration.  Lymph: No cervical or inguinal lymphadenopathy. Skin: No rashes, bruises or suspicious lesions. Neurologic: Grossly intact, no focal deficits, moving all 4 extremities. Psychiatric: Normal mood and affect.  Laboratory Data: Lab  Results  Component Value Date   WBC 9.5 02/26/2020   HGB 12.5 02/26/2020   HCT 37.9 02/26/2020   MCV 85.9 02/26/2020   PLT 276 02/26/2020    Lab Results  Component Value Date   CREATININE 0.72 02/26/2020    No results found for: PSA  No results found for: TESTOSTERONE  Lab Results  Component Value Date   HGBA1C 15.1 (H) 02/25/2020    Urinalysis    Component Value Date/Time   COLORURINE YELLOW 02/25/2020 1327   APPEARANCEUR CLEAR 02/25/2020 1327   LABSPEC 1.023 02/25/2020 1327   PHURINE 7.0 02/25/2020 1327   GLUCOSEU >=500 (A) 02/25/2020 1327   HGBUR MODERATE (A) 02/25/2020 1327   HGBUR moderate 08/31/2008 0908   BILIRUBINUR neg 03/16/2020 1407   [place] 02/25/2020  1327   PROTEINUR Positive (A) 03/16/2020 1407   PROTEINUR NEGATIVE 02/25/2020 1327   UROBILINOGEN 0.2 03/16/2020 1407   UROBILINOGEN 0.2 09/21/2014 1055   NITRITE neg 03/16/2020 1407   NITRITE NEGATIVE 02/25/2020 1327   LEUKOCYTESUR Small (1+) (A) 03/16/2020 1407   LEUKOCYTESUR NEGATIVE 02/25/2020 1327    Lab Results  Component Value Date   BACTERIA NONE SEEN 02/25/2020    Pertinent Imaging: CT abd/pelvis: images reviewed and discussed with patient Results for orders placed during the hospital encounter of 06/01/09  DG Abd 1 View   Narrative Clinical Data: Right side pain.  History of urinary tract stones.   ABDOMEN - 1 VIEW   Comparison: Abdomen 06/21/2008.   Findings: As on the prior study, there is a calcification projecting over the right kidney consistent with a 0.9 cm stone. As on the prior study, the stone may be in the right renal pelvis. No new unexpected abdominal calcification is identified.  Bowel gas pattern is unremarkable.  No focal bony abnormality.   IMPRESSION: Unchanged 0.9 cm right renal stone.  Provider: Wilkie Aye   No results found for this or any previous visit. No results found for this or any previous visit. No results found for this or any  previous visit. No results found for this or any previous visit. No results found for this or any previous visit. No results found for this or any previous visit. No results found for this or any previous visit.  Assessment & Plan:    1. Kidney stones -We will proceed with right ureteroscopic stone extraction. Risks/benefits/alterantives discussed - POCT urinalysis dipstick   No follow-ups on file.  Wilkie Aye, MD  [facility]rology

## 2020-03-16 NOTE — Progress Notes (Signed)
Urological Symptom Review  Patient is experiencing the following symptoms: Frequent urination Hard to postpone urination Burning/pain with urination Get up at night to urinate Leakage of urine Stream starts and stops Trouble starting stream Have to strain to urinate Weak stream   Review of Systems  Gastrointestinal (upper)  : Negative for upper GI symptoms  Gastrointestinal (lower) : Diarrhea Constipation  Constitutional : Negative for symptoms  Skin: Negative for skin symptoms  Eyes: Blurred vision  Ear/Nose/Throat : Sinus problems  Hematologic/Lymphatic: Negative for Hematologic/Lymphatic symptoms  Cardiovascular : Leg swelling Chest pain  Respiratory : Shortness of breath  Endocrine: Excessive thirst  Musculoskeletal: Back pain Joint pain  Neurological: Headaches Dizziness  Psychologic: Depression Anxiety

## 2020-03-17 ENCOUNTER — Telehealth: Payer: Self-pay | Admitting: Urology

## 2020-03-17 NOTE — Telephone Encounter (Signed)
Patient requests a return call from nurse. She received a call from [facility] this morning about scheduling her procedure but she is confused.

## 2020-03-17 NOTE — Telephone Encounter (Signed)
Pt called to confirm her surgery date of May 17th.

## 2020-03-17 NOTE — Telephone Encounter (Signed)
Patient lm asking for a return call. I called her at 1:30pm and got VM, so I left a message.

## 2020-03-24 ENCOUNTER — Telehealth: Payer: Self-pay | Admitting: Urology

## 2020-03-24 NOTE — Telephone Encounter (Signed)
Spoke with pt and message sent to Dr. Ronne Binning to post pone surgery until june

## 2020-03-24 NOTE — Telephone Encounter (Signed)
Pt called and stated she needs to reschedule her surgery.

## 2020-03-30 ENCOUNTER — Ambulatory Visit: Payer: Self-pay | Admitting: "Endocrinology

## 2020-04-01 ENCOUNTER — Telehealth: Payer: Self-pay | Admitting: Urology

## 2020-04-01 NOTE — Telephone Encounter (Signed)
Pt called with reports of new hematuria. Pt had to post pone her surgery until June and currently has stent in place. Pt concerned. Reports urine is blood-tinge denies any fever. Appt scheduled for Monday at 2:15

## 2020-04-01 NOTE — Telephone Encounter (Signed)
Pt called and requests a nurse return her call to discuss urinary issues she is experiencing.

## 2020-04-03 ENCOUNTER — Other Ambulatory Visit: Payer: Self-pay

## 2020-04-03 DIAGNOSIS — I10 Essential (primary) hypertension: Secondary | ICD-10-CM | POA: Insufficient documentation

## 2020-04-03 DIAGNOSIS — Z794 Long term (current) use of insulin: Secondary | ICD-10-CM | POA: Insufficient documentation

## 2020-04-03 DIAGNOSIS — E119 Type 2 diabetes mellitus without complications: Secondary | ICD-10-CM | POA: Diagnosis not present

## 2020-04-03 DIAGNOSIS — R310 Gross hematuria: Secondary | ICD-10-CM | POA: Insufficient documentation

## 2020-04-03 DIAGNOSIS — Z79899 Other long term (current) drug therapy: Secondary | ICD-10-CM | POA: Diagnosis not present

## 2020-04-03 DIAGNOSIS — R109 Unspecified abdominal pain: Secondary | ICD-10-CM | POA: Insufficient documentation

## 2020-04-03 DIAGNOSIS — Z960 Presence of urogenital implants: Secondary | ICD-10-CM | POA: Diagnosis not present

## 2020-04-04 ENCOUNTER — Emergency Department (HOSPITAL_COMMUNITY)
Admission: EM | Admit: 2020-04-04 | Discharge: 2020-04-04 | Disposition: A | Discharge status: Home / Self Care | Payer: PRIVATE HEALTH INSURANCE | Attending: Emergency Medicine | Admitting: Emergency Medicine

## 2020-04-04 ENCOUNTER — Other Ambulatory Visit (HOSPITAL_COMMUNITY)
Admission: RE | Admit: 2020-04-04 | Discharge: 2020-04-04 | Disposition: A | Discharge status: Home / Self Care | Payer: PRIVATE HEALTH INSURANCE | Source: Ambulatory Visit | Attending: Urology | Admitting: Urology

## 2020-04-04 ENCOUNTER — Ambulatory Visit (INDEPENDENT_AMBULATORY_CARE_PROVIDER_SITE_OTHER): Payer: No Typology Code available for payment source | Admitting: Urology

## 2020-04-04 ENCOUNTER — Encounter (HOSPITAL_COMMUNITY): Payer: Self-pay | Admitting: Emergency Medicine

## 2020-04-04 ENCOUNTER — Emergency Department (HOSPITAL_COMMUNITY): Payer: PRIVATE HEALTH INSURANCE

## 2020-04-04 ENCOUNTER — Encounter: Payer: Self-pay | Admitting: Urology

## 2020-04-04 VITALS — BP 126/77 | HR 85 | Temp 96.3°F

## 2020-04-04 DIAGNOSIS — N200 Calculus of kidney: Secondary | ICD-10-CM

## 2020-04-04 DIAGNOSIS — R310 Gross hematuria: Secondary | ICD-10-CM | POA: Diagnosis not present

## 2020-04-04 DIAGNOSIS — Z960 Presence of urogenital implants: Secondary | ICD-10-CM

## 2020-04-04 DIAGNOSIS — R109 Unspecified abdominal pain: Secondary | ICD-10-CM

## 2020-04-04 LAB — POCT URINALYSIS DIPSTICK
Bilirubin, UA: NEGATIVE
Glucose, UA: NEGATIVE
Ketones, UA: NEGATIVE
Nitrite, UA: NEGATIVE
Protein, UA: POSITIVE — AB
Spec Grav, UA: 1.02 (ref 1.010–1.025)
Urobilinogen, UA: 0.2 E.U./dL
pH, UA: 7 (ref 5.0–8.0)

## 2020-04-04 LAB — CBC WITH DIFFERENTIAL/PLATELET
Abs Immature Granulocytes: 0.03 10*3/uL (ref 0.00–0.07)
Basophils Absolute: 0.1 10*3/uL (ref 0.0–0.1)
Basophils Relative: 1 %
Eosinophils Absolute: 0.2 10*3/uL (ref 0.0–0.5)
Eosinophils Relative: 2 %
HCT: 39.3 % (ref 36.0–46.0)
Hemoglobin: 12.5 g/dL (ref 12.0–15.0)
Immature Granulocytes: 0 %
Lymphocytes Relative: 30 %
Lymphs Abs: 3.7 10*3/uL (ref 0.7–4.0)
MCH: 28 pg (ref 26.0–34.0)
MCHC: 31.8 g/dL (ref 30.0–36.0)
MCV: 87.9 fL (ref 80.0–100.0)
Monocytes Absolute: 0.9 10*3/uL (ref 0.1–1.0)
Monocytes Relative: 8 %
Neutro Abs: 7.2 10*3/uL (ref 1.7–7.7)
Neutrophils Relative %: 59 %
Platelets: 386 10*3/uL (ref 150–400)
RBC: 4.47 MIL/uL (ref 3.87–5.11)
RDW: 13.2 % (ref 11.5–15.5)
WBC: 12.1 10*3/uL — ABNORMAL HIGH (ref 4.0–10.5)
nRBC: 0 % (ref 0.0–0.2)

## 2020-04-04 LAB — BASIC METABOLIC PANEL
Anion gap: 13 (ref 5–15)
BUN: 15 mg/dL (ref 6–20)
CO2: 28 mmol/L (ref 22–32)
Calcium: 8.7 mg/dL — ABNORMAL LOW (ref 8.9–10.3)
Chloride: 94 mmol/L — ABNORMAL LOW (ref 98–111)
Creatinine, Ser: 0.83 mg/dL (ref 0.44–1.00)
GFR calc Af Amer: >60 mL/min (ref >60)
GFR calc non Af Amer: >60 mL/min (ref >60)
Glucose, Bld: 224 mg/dL — ABNORMAL HIGH (ref 70–99)
Potassium: 2.5 mmol/L — CL (ref 3.5–5.1)
Sodium: 135 mmol/L (ref 135–145)

## 2020-04-04 LAB — URINALYSIS, ROUTINE W REFLEX MICROSCOPIC
Bacteria, UA: NONE SEEN
Bilirubin Urine: NEGATIVE
Glucose, UA: NEGATIVE mg/dL
Ketones, ur: NEGATIVE mg/dL
Nitrite: NEGATIVE
Protein, ur: 100 mg/dL — AB
RBC / HPF: >50 RBC/hpf — ABNORMAL HIGH (ref 0–5)
Specific Gravity, Urine: 1.02 (ref 1.005–1.030)
pH: 6 (ref 5.0–8.0)

## 2020-04-04 MED ORDER — POTASSIUM CHLORIDE CRYS ER 20 MEQ PO TBCR
40.0000 meq | EXTENDED_RELEASE_TABLET | Freq: Once | ORAL | Status: AC
  Administered 2020-04-04: 40 meq via ORAL
  Filled 2020-04-04: qty 2

## 2020-04-04 MED ORDER — NITROFURANTOIN MACROCRYSTAL 100 MG PO CAPS: 100.0000 mg | ORAL_CAPSULE | Freq: Two times a day (BID) | ORAL | 0 refills | Status: DC

## 2020-04-04 MED ORDER — POTASSIUM CHLORIDE 10 MEQ/100ML IV SOLN
10.0000 meq | INTRAVENOUS | Status: AC
  Administered 2020-04-04 (×2): 10 meq via INTRAVENOUS
  Filled 2020-04-04 (×2): qty 100

## 2020-04-04 MED ORDER — HYDROCODONE-ACETAMINOPHEN 5-325 MG PO TABS: 1.0000 | ORAL_TABLET | ORAL | 0 refills | Status: DC | PRN

## 2020-04-04 NOTE — Addendum Note (Signed)
Addended by: Ferdinand Lango on: 04/04/2020 04:19 PM   Modules accepted: Orders

## 2020-04-04 NOTE — ED Provider Notes (Signed)
[facility] EMERGENCY DEPARTMENT Provider Note   CSN: [id number] Arrival date & time: 04/03/20  2350     History Chief Complaint  Patient presents with  . Hematuria    [name] is a 54 y.o. female.  Patient presents to the emergency department for evaluation of left flank pain and hematuria.  Patient has a ureteral stent on the left side that was placed on March 18.  She reports that she has noticed urinary hesitancy, decreased urine volume as well as the flank pain and hematuria.        Past Medical History:  Diagnosis Date  . Diabetes mellitus    insulin pump 2013  . Hypercholesteremia   . Hypertension   . Kidney stone   . Neuropathy   . Obesity   . Obstructive sleep apnea   . Palpitations   . Shortness of breath     Patient Active Problem List   Diagnosis Date Noted  . GERD (gastroesophageal reflux disease) 03/09/2020  . Dysphagia 03/09/2020  . Hypokalemia 02/26/2020  . Ureterolithiasis 02/26/2020  . Hyperglycemia 02/25/2020  . Preventative health care 07/15/2019  . Other intervertebral disc degeneration, lumbar region 05/24/2019  . Palpitations 12/08/2014  . Chest pain 12/08/2014  . Obstructive sleep apnea 12/08/2014  . Incarcerated umbilical hernia 03/25/2014  . Cellulitis 02/19/2014  . Pelvic pain 07/15/2013  . Depression 07/15/2013  . Decreased libido 12/23/2012  . Dyspnea 11/12/2012  . Headache(784.0) 10/14/2012  . Neuropathy 10/14/2012  . Candidal intertrigo 09/12/2012  . Diabetes mellitus type II, uncontrolled (HCC) 05/10/2012  . Hypertension 05/10/2012  . HYPERLIPIDEMIA 09/29/2008  . OBESITY 08/31/2008  . Kidney stones 08/31/2008    Past Surgical History:  Procedure Laterality Date  . CARDIAC CATHETERIZATION  12/11/2012   normal coronary arteries  . CESAREAN SECTION  1994;2000   [facility]  . CHOLECYSTECTOMY  1992  . CYSTOSCOPY W/ URETERAL STENT PLACEMENT  05/08/2012   Procedure: CYSTOSCOPY WITH RETROGRADE PYELOGRAM/URETERAL STENT  PLACEMENT;  Surgeon: Ky Barban, MD;  Location: [facility];  Service: Urology;  Laterality: Right;  . CYSTOSCOPY WITH STENT PLACEMENT Right 02/25/2020   Procedure: CYSTOSCOPY WITH RIGHT RETROGRADE RIGHT STENT PLACEMENT;  Surgeon: Jerilee Field, MD;  Location: [facility];  Service: Urology;  Laterality: Right;  . FOOT SURGERY Right March, 2013   [facility]-removal of bone spur  . HYSTEROSCOPY WITH THERMACHOICE  04/25/2012   Procedure: HYSTEROSCOPY WITH THERMACHOICE;  Surgeon: Lazaro Arms, MD;  Location: [facility];  Service: Gynecology;  Laterality: N/A;  total therapy time= 11 minutes 42 seconds; 34 ml D5w  in and 34 ml D5w out; temp =87 degrees F  . LEFT HEART CATHETERIZATION WITH CORONARY ANGIOGRAM N/A 12/11/2012   Procedure: LEFT HEART CATHETERIZATION WITH CORONARY ANGIOGRAM;  Surgeon: Runell Gess, MD;  Location: [facility] CATH LAB;  Service: Cardiovascular;  Laterality: N/A;  . Sinus sergery  1988   [place]  . UMBILICAL HERNIA REPAIR N/A 03/25/2014   Procedure: UMBILICAL HERNIA REPAIR;  Surgeon: Marlane Hatcher, MD;  Location: [facility];  Service: General;  Laterality: N/A;  . uterine ablation       OB History    Gravida  2   Para  2   Term  2   Preterm      AB      Living  2     SAB      TAB      Ectopic      Multiple  Live Births              Family History  Problem Relation Age of Onset  . Diabetes Mother   . Depression Paternal Grandmother   . Depression Paternal Grandfather   . Heart disease Other   . Cancer Other   . Diabetes Other   . Achalasia Other   . Anesthesia problems Neg Hx   . Hypotension Neg Hx   . Malignant hyperthermia Neg Hx   . Pseudochol deficiency Neg Hx     Social History   Tobacco Use  . Smoking status: Never Smoker  . Smokeless tobacco: Never Used  Substance Use Topics  . Alcohol use: No  . Drug use: No    Home Medications Prior to Admission medications   Medication Sig Start Date End Date Taking? Authorizing  Provider  albuterol (VENTOLIN HFA) 108 (90 Base) MCG/ACT inhaler Inhale 2 puffs into the lungs every 6 (six) hours as needed for wheezing or shortness of breath.  08/27/19   [provider]  ALPRAZolam Prudy Feeler) 1 MG tablet Take 1 mg by mouth at bedtime.  02/04/18   [provider]  ARIPiprazole (ABILIFY) 2 MG tablet Take 2 mg by mouth at bedtime.  04/13/19   [provider]  buPROPion (WELLBUTRIN XL) 150 MG 24 hr tablet Take 150 mg by mouth daily.  12/29/17   [provider]  DULoxetine (CYMBALTA) 60 MG capsule Take 60 mg by mouth 2 (two) times daily.  02/17/18   [provider]  furosemide (LASIX) 20 MG tablet Take 40 mg by mouth daily. 12/08/19   [provider]  gabapentin (NEURONTIN) 600 MG tablet Take 600 mg by mouth 3 (three) times daily.     [provider]  HUMALOG 100 UNIT/ML injection Inject into the skin continuous. 3.5 unirs every 1 hr-insulin pump 01/06/15   [provider]  LORazepam (ATIVAN) 0.5 MG tablet Take 0.5 mg by mouth daily as needed for anxiety.  01/29/18   [provider]  Multiple Vitamin (MULTIVITAMIN WITH MINERALS) TABS tablet Take 1 tablet by mouth daily.    [provider]  omeprazole (PRILOSEC) 40 MG capsule Take 40 mg by mouth daily. 02/24/20   [provider]  simvastatin (ZOCOR) 40 MG tablet Take 40 mg by mouth daily.  03/29/19   [provider]    Allergies    Ciprofloxacin, Penicillins, Codeine, Morphine, Sulfonamide derivatives, and Vancomycin  Review of Systems   Review of Systems  Genitourinary: Positive for flank pain and hematuria.  All other systems reviewed and are negative.   Physical Exam Updated Vital Signs BP 139/71 (BP Location: Right Arm)   Pulse 78   Temp (!) 97.5 F (36.4 C) (Oral)   Resp 18   Ht 4\' 8"  (1.422 m)   Wt 91.2 kg   LMP 07/16/2012   SpO2 99%   BMI 45.06 kg/m   Physical Exam Vitals and nursing note reviewed.    Constitutional:      General: She is not in acute distress.    Appearance: Normal appearance. She is well-developed.  HENT:     Head: Normocephalic and atraumatic.     Right Ear: Hearing normal.     Left Ear: Hearing normal.     Nose: Nose normal.  Eyes:     Conjunctiva/sclera: Conjunctivae normal.     Pupils: Pupils are equal, round, and reactive to light.  Cardiovascular:     Rate and Rhythm: Regular rhythm.  Heart sounds: S1 normal and S2 normal. No murmur. No friction rub. No gallop.   Pulmonary:     Effort: Pulmonary effort is normal. No respiratory distress.     Breath sounds: Normal breath sounds.  Chest:     Chest wall: No tenderness.  Abdominal:     General: Bowel sounds are normal.     Palpations: Abdomen is soft.     Tenderness: There is no abdominal tenderness. There is no guarding or rebound. Negative signs include Murphy's sign and McBurney's sign.     Hernia: No hernia is present.  Musculoskeletal:        General: Normal range of motion.     Cervical back: Normal range of motion and neck supple.  Skin:    General: Skin is warm and dry.     Findings: No rash.  Neurological:     Mental Status: She is alert and oriented to person, place, and time.     GCS: GCS eye subscore is 4. GCS verbal subscore is 5. GCS motor subscore is 6.     Cranial Nerves: No cranial nerve deficit.     Sensory: No sensory deficit.     Coordination: Coordination normal.  Psychiatric:        Speech: Speech normal.        Behavior: Behavior normal.        Thought Content: Thought content normal.     ED Results / Procedures / Treatments   Labs (all labs ordered are listed, but only abnormal results are displayed) Labs Reviewed  CBC WITH DIFFERENTIAL/PLATELET - Abnormal; Notable for the following components:      Result Value   WBC 12.1 (*)    All other components within normal limits  BASIC METABOLIC PANEL - Abnormal; Notable for the following components:   Potassium 2.5 (*)     Chloride 94 (*)    Glucose, Bld 224 (*)    Calcium 8.7 (*)    All other components within normal limits  URINALYSIS, ROUTINE W REFLEX MICROSCOPIC - Abnormal; Notable for the following components:   Color, Urine AMBER (*)    APPearance CLOUDY (*)    Hgb urine dipstick LARGE (*)    Protein, ur 100 (*)    Leukocytes,Ua SMALL (*)    RBC / HPF >50 (*)    All other components within normal limits  URINE CULTURE    EKG None  Radiology CT RENAL STONE STUDY  Result Date: 04/04/2020 CLINICAL DATA:  Left flank pain and hematuria EXAM: CT ABDOMEN AND PELVIS WITHOUT CONTRAST TECHNIQUE: Multidetector CT imaging of the abdomen and pelvis was performed following the standard protocol without IV contrast. COMPARISON:  February 25, 2020 FINDINGS: Lower chest: The visualized heart size within normal limits. No pericardial fluid/thickening. No hiatal hernia. The visualized portions of the lungs are clear. Hepatobiliary: Although limited due to the lack of intravenous contrast, normal in appearance without gross focal abnormality. The patient is status post cholecystectomy. No biliary ductal dilation. Pancreas:  Unremarkable.  No surrounding inflammatory changes. Spleen: Normal in size. Although limited due to the lack of intravenous contrast, normal in appearance. Adrenals/Urinary Tract: Both adrenal glands appear normal. There is been interval placement a right-sided double-J ureteral catheter. There is a clustered 1 cm calculus seen within the lower pole of the right kidney. There appears to be mild stranding changes seen around the right ureter extending to the UVJ. There remains mild right ureterectasis present. There is ureterectasis. Again noted are  multiple clustered calcifications within the upper pole left kidney extending into the renal pelvis measuring 1.7 cm. There is tiny punctate calcification pole the left kidney measuring 3 mm. No left-sided hydronephrosis is seen. Stomach/Bowel: The stomach, small  bowel, and colon are normal in appearance. No inflammatory changes or obstructive findings. appendix is normal. Vascular/Lymphatic: There are no enlarged abdominal or pelvic lymph nodes. No significant gross vascular findings are present. Reproductive: The uterus and adnexa are unremarkable. Other: No evidence of abdominal wall mass or hernia. Musculoskeletal: No acute or significant osseous findings. IMPRESSION: 1. Interval placement right-sided double-J ureteral stent with mild residual ureterectasis. There is also periureteral fat stranding changes seen to the level of the UVJ which could be due to mild ureteritis. 2. Unchanged bilateral renal calculi Electronically Signed   By: Prudencio Pair M.D.   On: 04/04/2020 02:13    Procedures Procedures (including critical care time)  Medications Ordered in ED Medications  potassium chloride 10 mEq in 100 mL IVPB (10 mEq Intravenous New Bag/Given 04/04/20 0344)  potassium chloride SA (KLOR-CON) CR tablet 40 mEq (40 mEq Oral Given 04/04/20 0242)    ED Course  I have reviewed the triage vital signs and the nursing notes.  Pertinent labs & imaging results that were available during my care of the patient were reviewed by me and considered in my medical decision making (see chart for details).    MDM Rules/Calculators/A&P                      Patient presents to the ER for evaluation of flank pain and hematuria.  Patient has had previous ureteral stenting secondary to ureterolithiasis.  Records indicate that the patient canceled her stent removal surgery and it has been rescheduled for June.  She now is concerned that it has been in too long.  Urinalysis shows hematuria but no sign of infection.  Kidney function is normal.  The only Alvarado abnormality seen is hypokalemia which has been seen chronically with her in the past.  She was given oral and IV replacement.  CT scan does not show any interval change in the ureteral stent or new pathology.  Patient  does not require urgent or emergent urologic intervention at this time.  She will be discharged and is to follow-up with urology as an outpatient.  Final Clinical Impression(s) / ED Diagnoses Final diagnoses:  Flank pain  Gross hematuria  S/P ureteral stent placement    Rx / DC Orders ED Discharge Orders    None       Salvadore Valvano, Gwenyth Allegra, MD 04/04/20 0345

## 2020-04-04 NOTE — ED Notes (Signed)
Date and time results received: 04/04/20 0209  Test: K+ Critical Value: 2.5   Name of Provider Notified: Pollina  Orders Received? Or Actions Taken?: N/A

## 2020-04-04 NOTE — Progress Notes (Signed)
04/04/2020 3:13 PM   [name] 07-Jul-1966 [id number]  Referring provider: Benita Stabile, MD [address] [place],  [place] [address]  Gross hematuria and nephrolithiasis  HPI: [name] is a 53yo here for evaluation of gross hematuria. She has a indwelling right ureteral stent. She cancelled her last surgery. She has worsening urgency, frequency, gross hematuria.  She had a fever last week fo a couple of days.   PMH: Past Medical History:  Diagnosis Date  . Diabetes mellitus    insulin pump 2013  . Hypercholesteremia   . Hypertension   . Kidney stone   . Neuropathy   . Obesity   . Obstructive sleep apnea   . Palpitations   . Shortness of breath     Surgical History: Past Surgical History:  Procedure Laterality Date  . CARDIAC CATHETERIZATION  12/11/2012   normal coronary arteries  . CESAREAN SECTION  1994;2000   [facility]  . CHOLECYSTECTOMY  1992  . CYSTOSCOPY W/ URETERAL STENT PLACEMENT  05/08/2012   Procedure: CYSTOSCOPY WITH RETROGRADE PYELOGRAM/URETERAL STENT PLACEMENT;  Surgeon: Ky Barban, MD;  Location: [facility];  Service: Urology;  Laterality: Right;  . CYSTOSCOPY WITH STENT PLACEMENT Right 02/25/2020   Procedure: CYSTOSCOPY WITH RIGHT RETROGRADE RIGHT STENT PLACEMENT;  Surgeon: Jerilee Field, MD;  Location: [facility];  Service: Urology;  Laterality: Right;  . FOOT SURGERY Right March, 2013   [facility]-removal of bone spur  . HYSTEROSCOPY WITH THERMACHOICE  04/25/2012   Procedure: HYSTEROSCOPY WITH THERMACHOICE;  Surgeon: Lazaro Arms, MD;  Location: [facility];  Service: Gynecology;  Laterality: N/A;  total therapy time= 11 minutes 42 seconds; 34 ml D5w  in and 34 ml D5w out; temp =87 degrees F  . LEFT HEART CATHETERIZATION WITH CORONARY ANGIOGRAM N/A 12/11/2012   Procedure: LEFT HEART CATHETERIZATION WITH CORONARY ANGIOGRAM;  Surgeon: Runell Gess, MD;  Location: [facility] CATH LAB;  Service: Cardiovascular;  Laterality: N/A;  . Sinus sergery  1988   [place]  . UMBILICAL HERNIA REPAIR N/A 03/25/2014   Procedure: UMBILICAL HERNIA REPAIR;  Surgeon: Marlane Hatcher, MD;  Location: [facility];  Service: General;  Laterality: N/A;  . uterine ablation      Home Medications:  Allergies as of 04/04/2020      Reactions   Ciprofloxacin Hives, Swelling   Penicillins Anaphylaxis, Rash   Codeine Other (See Comments)   REACTION:  had hallicunations   Morphine Nausea And Vomiting, Other (See Comments)   REACTION:   recieved in ED due to [facility] and had multple doses - gave visual hallucinations and vomitting.   Sulfonamide Derivatives Other (See Comments)   REACTION: Unsure - childhood allergy   Vancomycin Itching      Medication List       Accurate as of April 04, 2020  3:13 PM. If you have any questions, ask your nurse or doctor.        albuterol 108 (90 Base) MCG/ACT inhaler Commonly known as: VENTOLIN HFA Inhale 2 puffs into the lungs every 6 (six) hours as needed for wheezing or shortness of breath.   ALPRAZolam 1 MG tablet Commonly known as: XANAX Take 1 mg by mouth at bedtime.   ARIPiprazole 2 MG tablet Commonly known as: ABILIFY Take 2 mg by mouth at bedtime.   buPROPion 150 MG 24 hr tablet Commonly known as: WELLBUTRIN XL Take 150 mg by mouth daily.   DULoxetine 60 MG capsule Commonly known as: CYMBALTA Take 60  mg by mouth 2 (two) times daily.   furosemide 20 MG tablet Commonly known as: LASIX Take 40 mg by mouth daily.   gabapentin 600 MG tablet Commonly known as: NEURONTIN Take 600 mg by mouth 3 (three) times daily.   HumaLOG 100 UNIT/ML injection Generic drug: insulin lispro Inject into the skin continuous. 3.5 unirs every 1 hr-insulin pump   HYDROcodone-acetaminophen 5-325 MG tablet Commonly known as: NORCO/VICODIN Take 1-2 tablets by mouth every 4 (four) hours as needed.   LORazepam 0.5 MG tablet Commonly known as: ATIVAN Take 0.5 mg by mouth daily as needed for anxiety.   multivitamin with  minerals Tabs tablet Take 1 tablet by mouth daily.   omeprazole 40 MG capsule Commonly known as: PRILOSEC Take 40 mg by mouth daily.   simvastatin 40 MG tablet Commonly known as: ZOCOR Take 40 mg by mouth daily.       Allergies:  Allergies  Allergen Reactions  . Ciprofloxacin Hives and Swelling  . Penicillins Anaphylaxis and Rash  . Codeine Other (See Comments)    REACTION:  had hallicunations  . Morphine Nausea And Vomiting and Other (See Comments)    REACTION:   recieved in ED due to [facility] and had multple doses - gave visual hallucinations and vomitting.  . Sulfonamide Derivatives Other (See Comments)    REACTION: Unsure - childhood allergy  . Vancomycin Itching    Family History: Family History  Problem Relation Age of Onset  . Diabetes Mother   . Depression Paternal Grandmother   . Depression Paternal Grandfather   . Heart disease Other   . Cancer Other   . Diabetes Other   . Achalasia Other   . Anesthesia problems Neg Hx   . Hypotension Neg Hx   . Malignant hyperthermia Neg Hx   . Pseudochol deficiency Neg Hx     Social History:  reports that she has never smoked. She has never used smokeless tobacco. She reports that she does not drink alcohol or use drugs.  ROS: All other review of systems were reviewed and are negative except what is noted above in HPI  Physical Exam: BP 126/77   Pulse 85   Temp (!) 96.3 F (35.7 C)   LMP 07/16/2012   Constitutional:  Alert and oriented, No acute distress. HEENT: [place] AT, moist mucus membranes.  Trachea midline, no masses. Cardiovascular: No clubbing, cyanosis, or edema. Respiratory: Normal respiratory effort, no increased work of breathing. GI: Abdomen is soft, nontender, nondistended, no abdominal masses GU: No CVA tenderness. Circumcised phallus. No masses/lesions on penis, testis, scrotum. Prostate 40g smooth no nodules no induration.  Lymph: No cervical or inguinal lymphadenopathy. Skin: No rashes,  bruises or suspicious lesions. Neurologic: Grossly intact, no focal deficits, moving all 4 extremities. Psychiatric: Normal mood and affect.  Laboratory Data: Lab Results  Component Value Date   WBC 12.1 (H) 04/04/2020   HGB 12.5 04/04/2020   HCT 39.3 04/04/2020   MCV 87.9 04/04/2020   PLT 386 04/04/2020    Lab Results  Component Value Date   CREATININE 0.83 04/04/2020    No results found for: PSA  No results found for: TESTOSTERONE  Lab Results  Component Value Date   HGBA1C 15.1 (H) 02/25/2020    Urinalysis    Component Value Date/Time   COLORURINE AMBER (A) 04/04/2020 0309   APPEARANCEUR CLOUDY (A) 04/04/2020 0309   LABSPEC 1.020 04/04/2020 0309   PHURINE 6.0 04/04/2020 0309   GLUCOSEU NEGATIVE 04/04/2020 0309   HGBUR  LARGE (A) 04/04/2020 0309   HGBUR moderate 08/31/2008 0908   BILIRUBINUR negative 04/04/2020 1454   KETONESUR NEGATIVE 04/04/2020 0309   PROTEINUR Positive (A) 04/04/2020 1454   PROTEINUR 100 (A) 04/04/2020 0309   UROBILINOGEN 0.2 04/04/2020 1454   UROBILINOGEN 0.2 09/21/2014 1055   NITRITE negative 04/04/2020 1454   NITRITE NEGATIVE 04/04/2020 0309   LEUKOCYTESUR Moderate (2+) (A) 04/04/2020 1454   LEUKOCYTESUR SMALL (A) 04/04/2020 0309    Lab Results  Component Value Date   BACTERIA NONE SEEN 04/04/2020    Pertinent Imaging:  Results for orders placed during the hospital encounter of 06/01/09  DG Abd 1 View   Narrative Clinical Data: Right side pain.  History of urinary tract stones.   ABDOMEN - 1 VIEW   Comparison: Abdomen 06/21/2008.   Findings: As on the prior study, there is a calcification projecting over the right kidney consistent with a 0.9 cm stone. As on the prior study, the stone may be in the right renal pelvis. No new unexpected abdominal calcification is identified.  Bowel gas pattern is unremarkable.  No focal bony abnormality.   IMPRESSION: Unchanged 0.9 cm right renal stone.  Provider: Jennye Boroughs   No  results found for this or any previous visit. No results found for this or any previous visit. No results found for this or any previous visit. No results found for this or any previous visit. No results found for this or any previous visit. No results found for this or any previous visit. Results for orders placed during the hospital encounter of 04/04/20  CT RENAL STONE STUDY   Narrative CLINICAL DATA:  Left flank pain and hematuria  EXAM: CT ABDOMEN AND PELVIS WITHOUT CONTRAST  TECHNIQUE: Multidetector CT imaging of the abdomen and pelvis was performed following the standard protocol without IV contrast.  COMPARISON:  February 25, 2020  FINDINGS: Lower chest: The visualized heart size within normal limits. No pericardial fluid/thickening.  No hiatal hernia.  The visualized portions of the lungs are clear.  Hepatobiliary: Although limited due to the lack of intravenous contrast, normal in appearance without gross focal abnormality. The patient is status post cholecystectomy. No biliary ductal dilation.  Pancreas:  Unremarkable.  No surrounding inflammatory changes.  Spleen: Normal in size. Although limited due to the lack of intravenous contrast, normal in appearance.  Adrenals/Urinary Tract: Both adrenal glands appear normal. There is been interval placement a right-sided double-J ureteral catheter. There is a clustered 1 cm calculus seen within the lower pole of the right kidney. There appears to be mild stranding changes seen around the right ureter extending to the UVJ. There remains mild right ureterectasis present. There is ureterectasis. Again noted are multiple clustered calcifications within the upper pole left kidney extending into the renal pelvis measuring 1.7 cm. There is tiny punctate calcification pole the left kidney measuring 3 mm. No left-sided hydronephrosis is seen.  Stomach/Bowel: The stomach, small bowel, and colon are normal in appearance. No  inflammatory changes or obstructive findings. appendix is normal.  Vascular/Lymphatic: There are no enlarged abdominal or pelvic lymph nodes. No significant gross vascular findings are present.  Reproductive: The uterus and adnexa are unremarkable.  Other: No evidence of abdominal wall mass or hernia.  Musculoskeletal: No acute or significant osseous findings.  IMPRESSION: 1. Interval placement right-sided double-J ureteral stent with mild residual ureterectasis. There is also periureteral fat stranding changes seen to the level of the UVJ which could be due to mild ureteritis. 2. Unchanged bilateral  renal calculi   Electronically Signed   By: Jonna Clark M.D.   On: 04/04/2020 02:13     Assessment & Plan:    1. Kidney stones -Patient scheduled for rigth ureteroscopic stone extraction - POCT urinalysis dipstick  2. Gross hematuria -urine for culture, will call with results -macrodantin 100mg  BID    No follow-ups on file.  , MD  [facility] [place]

## 2020-04-04 NOTE — Progress Notes (Signed)
Urological Symptom Review  Patient is experiencing the following symptoms: Stream starts and stops Trouble starting stream Have to strain to urinate Blood in urine Weak stream   Review of Systems  Gastrointestinal (upper)  : Nausea Vomiting   Gastrointestinal (lower) : Negative for lower GI symptoms  Constitutional : Negative for symptoms  Skin: Negative for skin symptoms  Eyes: Blurred vision  Ear/Nose/Throat : Sinus problems  Hematologic/Lymphatic: Negative for Hematologic/Lymphatic symptoms  Cardiovascular : Chest pain  Respiratory : Shortness of breath  Endocrine: Negative for endocrine symptoms  Musculoskeletal: Negative for musculoskeletal symptoms  Neurological: Headaches  Psychologic: Depression Anxiety

## 2020-04-04 NOTE — ED Triage Notes (Signed)
Pt had stenting in March to L. Ureter. Pt states she has noticed a decreased ability to urinate (states she dribbles) but that "when she goes; she notices only blood". Pt has attempted to reach out to Urologist recently but has not been able to get in to see him. Pt concerned that stent has not been removed yet "because it has been over a month and it was supposed to come out after 3 weeks". Pt also c/o abdominal pain. States she is bloated "like she is pregnant" from not going to bathroom like she normally would.

## 2020-04-04 NOTE — H&P (View-Only) (Signed)
 04/04/2020 3:13 PM   [name] 04/13/1966 [id number]  Referring provider: Hall, John Z, MD [address] [place],  [place] [address]  Gross hematuria and nephrolithiasis  HPI: [name] is a 53yo here for evaluation of gross hematuria. She has a indwelling right ureteral stent. She cancelled her last surgery. She has worsening urgency, frequency, gross hematuria.  She had a fever last week fo a couple of days.   PMH: Past Medical History:  Diagnosis Date  . Diabetes mellitus    insulin pump 2013  . Hypercholesteremia   . Hypertension   . Kidney stone   . Neuropathy   . Obesity   . Obstructive sleep apnea   . Palpitations   . Shortness of breath     Surgical History: Past Surgical History:  Procedure Laterality Date  . CARDIAC CATHETERIZATION  12/11/2012   normal coronary arteries  . CESAREAN SECTION  1994;2000   [facility]  . CHOLECYSTECTOMY  1992  . CYSTOSCOPY W/ URETERAL STENT PLACEMENT  05/08/2012   Procedure: CYSTOSCOPY WITH RETROGRADE PYELOGRAM/URETERAL STENT PLACEMENT;  Surgeon: Mohammad I Javaid, MD;  Location: [facility];  Service: Urology;  Laterality: Right;  . CYSTOSCOPY WITH STENT PLACEMENT Right 02/25/2020   Procedure: CYSTOSCOPY WITH RIGHT RETROGRADE RIGHT STENT PLACEMENT;  Surgeon: Eskridge, Matthew, MD;  Location: [facility];  Service: Urology;  Laterality: Right;  . FOOT SURGERY Right March, 2013   [facility]-removal of bone spur  . HYSTEROSCOPY WITH THERMACHOICE  04/25/2012   Procedure: HYSTEROSCOPY WITH THERMACHOICE;  Surgeon: Luther H Eure, MD;  Location: [facility];  Service: Gynecology;  Laterality: N/A;  total therapy time= 11 minutes 42 seconds; 34 ml D5w  in and 34 ml D5w out; temp =87 degrees F  . LEFT HEART CATHETERIZATION WITH CORONARY ANGIOGRAM N/A 12/11/2012   Procedure: LEFT HEART CATHETERIZATION WITH CORONARY ANGIOGRAM;  Surgeon: Jonathan J Berry, MD;  Location: [facility];  Service: Cardiovascular;  Laterality: N/A;  . Sinus sergery  1988   [place]  . UMBILICAL HERNIA REPAIR N/A 03/25/2014   Procedure: UMBILICAL HERNIA REPAIR;  Surgeon: William S Bradford, MD;  Location: [facility];  Service: General;  Laterality: N/A;  . uterine ablation      Home Medications:  Allergies as of 04/04/2020      Reactions   Ciprofloxacin Hives, Swelling   Penicillins Anaphylaxis, Rash   Codeine Other (See Comments)   REACTION:  had hallicunations   Morphine Nausea And Vomiting, Other (See Comments)   REACTION:   recieved in ED due to kidneystones and had multple doses - gave visual hallucinations and vomitting.   Sulfonamide Derivatives Other (See Comments)   REACTION: Unsure - childhood allergy   Vancomycin Itching      Medication List       Accurate as of April 04, 2020  3:13 PM. If you have any questions, ask your nurse or doctor.        albuterol 108 (90 Base) MCG/ACT inhaler Commonly known as: VENTOLIN HFA Inhale 2 puffs into the lungs every 6 (six) hours as needed for wheezing or shortness of breath.   ALPRAZolam 1 MG tablet Commonly known as: XANAX Take 1 mg by mouth at bedtime.   ARIPiprazole 2 MG tablet Commonly known as: ABILIFY Take 2 mg by mouth at bedtime.   buPROPion 150 MG 24 hr tablet Commonly known as: WELLBUTRIN XL Take 150 mg by mouth daily.   DULoxetine 60 MG capsule Commonly known as: CYMBALTA Take 60   mg by mouth 2 (two) times daily.   furosemide 20 MG tablet Commonly known as: LASIX Take 40 mg by mouth daily.   gabapentin 600 MG tablet Commonly known as: NEURONTIN Take 600 mg by mouth 3 (three) times daily.   HumaLOG 100 UNIT/ML injection Generic drug: insulin lispro Inject into the skin continuous. 3.5 unirs every 1 hr-insulin pump   HYDROcodone-acetaminophen 5-325 MG tablet Commonly known as: NORCO/VICODIN Take 1-2 tablets by mouth every 4 (four) hours as needed.   LORazepam 0.5 MG tablet Commonly known as: ATIVAN Take 0.5 mg by mouth daily as needed for anxiety.   multivitamin with  minerals Tabs tablet Take 1 tablet by mouth daily.   omeprazole 40 MG capsule Commonly known as: PRILOSEC Take 40 mg by mouth daily.   simvastatin 40 MG tablet Commonly known as: ZOCOR Take 40 mg by mouth daily.       Allergies:  Allergies  Allergen Reactions  . Ciprofloxacin Hives and Swelling  . Penicillins Anaphylaxis and Rash  . Codeine Other (See Comments)    REACTION:  had hallicunations  . Morphine Nausea And Vomiting and Other (See Comments)    REACTION:   recieved in ED due to [facility] and had multple doses - gave visual hallucinations and vomitting.  . Sulfonamide Derivatives Other (See Comments)    REACTION: Unsure - childhood allergy  . Vancomycin Itching    Family History: Family History  Problem Relation Age of Onset  . Diabetes Mother   . Depression Paternal Grandmother   . Depression Paternal Grandfather   . Heart disease Other   . Cancer Other   . Diabetes Other   . Achalasia Other   . Anesthesia problems Neg Hx   . Hypotension Neg Hx   . Malignant hyperthermia Neg Hx   . Pseudochol deficiency Neg Hx     Social History:  reports that she has never smoked. She has never used smokeless tobacco. She reports that she does not drink alcohol or use drugs.  ROS: All other review of systems were reviewed and are negative except what is noted above in HPI  Physical Exam: BP 126/77   Pulse 85   Temp (!) 96.3 F (35.7 C)   LMP 07/16/2012   Constitutional:  Alert and oriented, No acute distress. HEENT: [place] AT, moist mucus membranes.  Trachea midline, no masses. Cardiovascular: No clubbing, cyanosis, or edema. Respiratory: Normal respiratory effort, no increased work of breathing. GI: Abdomen is soft, nontender, nondistended, no abdominal masses GU: No CVA tenderness. Circumcised phallus. No masses/lesions on penis, testis, scrotum. Prostate 40g smooth no nodules no induration.  Lymph: No cervical or inguinal lymphadenopathy. Skin: No rashes,  bruises or suspicious lesions. Neurologic: Grossly intact, no focal deficits, moving all 4 extremities. Psychiatric: Normal mood and affect.  Laboratory Data: Lab Results  Component Value Date   WBC 12.1 (H) 04/04/2020   HGB 12.5 04/04/2020   HCT 39.3 04/04/2020   MCV 87.9 04/04/2020   PLT 386 04/04/2020    Lab Results  Component Value Date   CREATININE 0.83 04/04/2020    No results found for: PSA  No results found for: TESTOSTERONE  Lab Results  Component Value Date   HGBA1C 15.1 (H) 02/25/2020    Urinalysis    Component Value Date/Time   COLORURINE AMBER (A) 04/04/2020 0309   APPEARANCEUR CLOUDY (A) 04/04/2020 0309   LABSPEC 1.020 04/04/2020 0309   PHURINE 6.0 04/04/2020 0309   GLUCOSEU NEGATIVE 04/04/2020 0309   HGBUR  LARGE (A) 04/04/2020 0309   HGBUR moderate 08/31/2008 0908   BILIRUBINUR negative 04/04/2020 1454   KETONESUR NEGATIVE 04/04/2020 0309   PROTEINUR Positive (A) 04/04/2020 1454   PROTEINUR 100 (A) 04/04/2020 0309   UROBILINOGEN 0.2 04/04/2020 1454   UROBILINOGEN 0.2 09/21/2014 1055   NITRITE negative 04/04/2020 1454   NITRITE NEGATIVE 04/04/2020 0309   LEUKOCYTESUR Moderate (2+) (A) 04/04/2020 1454   LEUKOCYTESUR SMALL (A) 04/04/2020 0309    Lab Results  Component Value Date   BACTERIA NONE SEEN 04/04/2020    Pertinent Imaging:  Results for orders placed during the hospital encounter of 06/01/09  DG Abd 1 View   Narrative Clinical Data: Right side pain.  History of urinary tract stones.   ABDOMEN - 1 VIEW   Comparison: Abdomen 06/21/2008.   Findings: As on the prior study, there is a calcification projecting over the right kidney consistent with a 0.9 cm stone. As on the prior study, the stone may be in the right renal pelvis. No new unexpected abdominal calcification is identified.  Bowel gas pattern is unremarkable.  No focal bony abnormality.   IMPRESSION: Unchanged 0.9 cm right renal stone.  Provider: Jennye Boroughs   No  results found for this or any previous visit. No results found for this or any previous visit. No results found for this or any previous visit. No results found for this or any previous visit. No results found for this or any previous visit. No results found for this or any previous visit. Results for orders placed during the hospital encounter of 04/04/20  CT RENAL STONE STUDY   Narrative CLINICAL DATA:  Left flank pain and hematuria  EXAM: CT ABDOMEN AND PELVIS WITHOUT CONTRAST  TECHNIQUE: Multidetector CT imaging of the abdomen and pelvis was performed following the standard protocol without IV contrast.  COMPARISON:  February 25, 2020  FINDINGS: Lower chest: The visualized heart size within normal limits. No pericardial fluid/thickening.  No hiatal hernia.  The visualized portions of the lungs are clear.  Hepatobiliary: Although limited due to the lack of intravenous contrast, normal in appearance without gross focal abnormality. The patient is status post cholecystectomy. No biliary ductal dilation.  Pancreas:  Unremarkable.  No surrounding inflammatory changes.  Spleen: Normal in size. Although limited due to the lack of intravenous contrast, normal in appearance.  Adrenals/Urinary Tract: Both adrenal glands appear normal. There is been interval placement a right-sided double-J ureteral catheter. There is a clustered 1 cm calculus seen within the lower pole of the right kidney. There appears to be mild stranding changes seen around the right ureter extending to the UVJ. There remains mild right ureterectasis present. There is ureterectasis. Again noted are multiple clustered calcifications within the upper pole left kidney extending into the renal pelvis measuring 1.7 cm. There is tiny punctate calcification pole the left kidney measuring 3 mm. No left-sided hydronephrosis is seen.  Stomach/Bowel: The stomach, small bowel, and colon are normal in appearance. No  inflammatory changes or obstructive findings. appendix is normal.  Vascular/Lymphatic: There are no enlarged abdominal or pelvic lymph nodes. No significant gross vascular findings are present.  Reproductive: The uterus and adnexa are unremarkable.  Other: No evidence of abdominal wall mass or hernia.  Musculoskeletal: No acute or significant osseous findings.  IMPRESSION: 1. Interval placement right-sided double-J ureteral stent with mild residual ureterectasis. There is also periureteral fat stranding changes seen to the level of the UVJ which could be due to mild ureteritis. 2. Unchanged bilateral  renal calculi   Electronically Signed   By: Jonna Clark M.D.   On: 04/04/2020 02:13     Assessment & Plan:    1. Kidney stones -Patient scheduled for rigth ureteroscopic stone extraction - POCT urinalysis dipstick  2. Gross hematuria -urine for culture, will call with results -macrodantin 100mg  BID    No follow-ups on file.  , MD  [facility] [place]

## 2020-04-04 NOTE — Patient Instructions (Signed)
Hematuria, Adult Hematuria is blood in the urine. Blood may be visible in the urine, or it may be identified with a test. This condition can be caused by infections of the bladder, urethra, kidney, or prostate. Other possible causes include:  Kidney stones.  Cancer of the urinary tract.  Too much calcium in the urine.  Conditions that are passed from parent to child (inherited conditions).  Exercise that requires a lot of energy. Infections can usually be treated with medicine, and a kidney stone usually will pass through your urine. If neither of these is the cause of your hematuria, more tests may be needed to identify the cause of your symptoms. It is very important to tell your health care provider about any blood in your urine, even if it is painless or the blood stops without treatment. Blood in the urine, when it happens and then stops and then happens again, can be a symptom of a very serious condition, including cancer. There is no pain in the initial stages of many urinary cancers. Follow these instructions at home: Medicines  Take over-the-counter and prescription medicines only as told by your health care provider.  If you were prescribed an antibiotic medicine, take it as told by your health care provider. Do not stop taking the antibiotic even if you start to feel better. Eating and drinking  Drink enough fluid to keep your urine clear or pale yellow. It is recommended that you drink 3-4 quarts (2.8-3.8 L) a day. If you have been diagnosed with an infection, it is recommended that you drink cranberry juice in addition to large amounts of water.  Avoid caffeine, tea, and carbonated beverages. These tend to irritate the bladder.  Avoid alcohol because it may irritate the prostate (men). General instructions  If you have been diagnosed with a kidney stone, follow your health care provider's instructions about straining your urine to catch the stone.  Empty your bladder  often. Avoid holding urine for long periods of time.  If you are female: ? After a bowel movement, wipe from front to back and use each piece of toilet paper only once. ? Empty your bladder before and after sex.  Pay attention to any changes in your symptoms. Tell your health care provider about any changes or any new symptoms.  It is your responsibility to get your test results. Ask your health care provider, or the department performing the test, when your results will be ready.  Keep all follow-up visits as told by your health care provider. This is important. Contact a health care provider if:  You develop back pain.  You have a fever.  You have nausea or vomiting.  Your symptoms do not improve after 3 days.  Your symptoms get worse. Get help right away if:  You develop severe vomiting and are unable take medicine without vomiting.  You develop severe pain in your back or abdomen even though you are taking medicine.  You pass a large amount of blood in your urine.  You pass blood clots in your urine.  You feel very weak or like you might faint.  You faint. Summary  Hematuria is blood in the urine. It has many possible causes.  It is very important that you tell your health care provider about any blood in your urine, even if it is painless or the blood stops without treatment.  Take over-the-counter and prescription medicines only as told by your health care provider.  Drink enough fluid to keep   your urine clear or pale yellow. This information is not intended to replace advice given to you by your health care provider. Make sure you discuss any questions you have with your health care provider. Document Revised: 04/22/2019 Document Reviewed: 12/29/2016 Elsevier Patient Education  2020 Elsevier Inc.  

## 2020-04-05 LAB — URINE CULTURE

## 2020-04-05 MED FILL — Hydrocodone-Acetaminophen Tab 5-325 MG: ORAL | Qty: 6 | Status: AC

## 2020-04-06 ENCOUNTER — Ambulatory Visit: Payer: No Typology Code available for payment source | Admitting: Urology

## 2020-04-06 ENCOUNTER — Ambulatory Visit: Payer: PRIVATE HEALTH INSURANCE | Admitting: Urology

## 2020-04-21 ENCOUNTER — Encounter (HOSPITAL_COMMUNITY): Payer: No Typology Code available for payment source

## 2020-04-22 ENCOUNTER — Other Ambulatory Visit (HOSPITAL_COMMUNITY): Payer: No Typology Code available for payment source

## 2020-04-28 ENCOUNTER — Ambulatory Visit: Payer: Self-pay | Admitting: "Endocrinology

## 2020-04-28 NOTE — Patient Instructions (Signed)
            JENNESS STEMLER  04/28/2020     @PREFPERIOPPHARMACY @   Your procedure is scheduled on   05/04/2020  Report to 05/06/2020 at  1115  A.M.  Call this number if you have problems the morning of surgery:  [phone]   Remember:  Do not eat or drink after midnight.                       Take these medicines the morning of surgery with A SIP OF WATER wellbutrin, cymbalta, gabapentin, ativan(if needed), prilosec. Dial your insulin pump down to your basal rate at midnight.    Do not wear jewelry, make-up or nail polish.  Do not wear lotions, powders, or perfume. Please wear deodorant and brush your teeth.  Do not shave 48 hours prior to surgery.  Men may shave face and neck.  Do not bring valuables to the hospital.  [facility] is not responsible for any belongings or valuables.  Contacts, dentures or bridgework may not be worn into surgery.  Leave your suitcase in the car.  After surgery it may be brought to your room.  For patients admitted to the hospital, discharge time will be determined by your treatment team.  Patients discharged the day of surgery will not be allowed to drive home.   Name and phone number of your driver:   family Special instructions:  DO NOT smoke the morning of your procedure.  Please read over the following fact sheets that you were given. Anesthesia Post-op Instructions and Care and Recovery After Surgery

## 2020-05-02 ENCOUNTER — Other Ambulatory Visit (HOSPITAL_COMMUNITY): Payer: No Typology Code available for payment source

## 2020-05-02 ENCOUNTER — Encounter (HOSPITAL_COMMUNITY): Payer: Self-pay

## 2020-05-02 ENCOUNTER — Encounter (HOSPITAL_COMMUNITY)
Admission: RE | Admit: 2020-05-02 | Discharge: 2020-05-02 | Disposition: A | Discharge status: Home / Self Care | Payer: No Typology Code available for payment source | Source: Ambulatory Visit | Attending: Urology | Admitting: Urology

## 2020-05-02 ENCOUNTER — Other Ambulatory Visit: Payer: Self-pay

## 2020-05-02 ENCOUNTER — Other Ambulatory Visit (HOSPITAL_COMMUNITY)
Admission: RE | Admit: 2020-05-02 | Discharge: 2020-05-02 | Disposition: A | Discharge status: Home / Self Care | Payer: No Typology Code available for payment source | Source: Ambulatory Visit | Attending: Urology | Admitting: Urology

## 2020-05-02 DIAGNOSIS — Z20822 Contact with and (suspected) exposure to covid-19: Secondary | ICD-10-CM | POA: Diagnosis not present

## 2020-05-02 DIAGNOSIS — Z01812 Encounter for preprocedural laboratory examination: Secondary | ICD-10-CM | POA: Diagnosis not present

## 2020-05-02 LAB — CBC WITH DIFFERENTIAL/PLATELET
Abs Immature Granulocytes: 0.03 10*3/uL (ref 0.00–0.07)
Basophils Absolute: 0.1 10*3/uL (ref 0.0–0.1)
Basophils Relative: 1 %
Eosinophils Absolute: 0.2 10*3/uL (ref 0.0–0.5)
Eosinophils Relative: 1 %
HCT: 36.9 % (ref 36.0–46.0)
Hemoglobin: 11.8 g/dL — ABNORMAL LOW (ref 12.0–15.0)
Immature Granulocytes: 0 %
Lymphocytes Relative: 27 %
Lymphs Abs: 3.1 10*3/uL (ref 0.7–4.0)
MCH: 27.6 pg (ref 26.0–34.0)
MCHC: 32 g/dL (ref 30.0–36.0)
MCV: 86.4 fL (ref 80.0–100.0)
Monocytes Absolute: 0.8 10*3/uL (ref 0.1–1.0)
Monocytes Relative: 7 %
Neutro Abs: 7.3 10*3/uL (ref 1.7–7.7)
Neutrophils Relative %: 64 %
Platelets: 384 10*3/uL (ref 150–400)
RBC: 4.27 MIL/uL (ref 3.87–5.11)
RDW: 13.5 % (ref 11.5–15.5)
WBC: 11.4 10*3/uL — ABNORMAL HIGH (ref 4.0–10.5)
nRBC: 0 % (ref 0.0–0.2)

## 2020-05-02 LAB — BASIC METABOLIC PANEL
Anion gap: 11 (ref 5–15)
BUN: 14 mg/dL (ref 6–20)
CO2: 29 mmol/L (ref 22–32)
Calcium: 8.9 mg/dL (ref 8.9–10.3)
Chloride: 97 mmol/L — ABNORMAL LOW (ref 98–111)
Creatinine, Ser: 0.74 mg/dL (ref 0.44–1.00)
GFR calc Af Amer: >60 mL/min (ref >60)
GFR calc non Af Amer: >60 mL/min (ref >60)
Glucose, Bld: 63 mg/dL — ABNORMAL LOW (ref 70–99)
Potassium: 2.8 mmol/L — ABNORMAL LOW (ref 3.5–5.1)
Sodium: 137 mmol/L (ref 135–145)

## 2020-05-02 LAB — HCG, SERUM, QUALITATIVE: Preg, Serum: NEGATIVE

## 2020-05-02 LAB — HCG, QUANTITATIVE, PREGNANCY: hCG, Beta Chain, Quant, S: 2 m[IU]/mL (ref <5)

## 2020-05-02 NOTE — Progress Notes (Signed)
Please excuse [name] from work on 05/02/2020 and 05/04/2020.  She was at [facility] for a preoperative appointment on 05/02/2020 and she will have surgery on 05/04/2020.

## 2020-05-02 NOTE — Progress Notes (Signed)
Patient is a Type 1 Diabetic on an insulin pump.  Spoke with Dr. Marijo File  MD and patient was instructed not to stop the insulin pump and no bolus the am of surgery. We will evaluate the patient on arrival and make decision on stopping the insulin pump.

## 2020-05-03 ENCOUNTER — Encounter (HOSPITAL_COMMUNITY): Payer: Self-pay

## 2020-05-03 LAB — SARS CORONAVIRUS 2 (TAT 6-24 HRS): SARS Coronavirus 2: NEGATIVE

## 2020-05-03 NOTE — Progress Notes (Addendum)
Potassium 2.8 - Notified Dr. Ronne Binning and orders called into [facility] pharmacy for Potassium 40 mg x 3 doses.  Patient will pick up prescription within the hour and start the medication.

## 2020-05-03 NOTE — Pre-Procedure Instructions (Signed)
Potassium of 2.8 sent to Dr Ronne Binning and Dr Alva Garnet.

## 2020-05-04 ENCOUNTER — Ambulatory Visit (HOSPITAL_COMMUNITY): Payer: PRIVATE HEALTH INSURANCE

## 2020-05-04 ENCOUNTER — Ambulatory Visit (HOSPITAL_COMMUNITY)
Admission: RE | Admit: 2020-05-04 | Discharge: 2020-05-04 | Disposition: A | Discharge status: Home / Self Care | Payer: PRIVATE HEALTH INSURANCE | Attending: Urology | Admitting: Urology

## 2020-05-04 ENCOUNTER — Encounter (HOSPITAL_COMMUNITY): Payer: Self-pay | Admitting: Urology

## 2020-05-04 ENCOUNTER — Other Ambulatory Visit (HOSPITAL_COMMUNITY)
Admission: RE | Admit: 2020-05-04 | Discharge: 2020-05-04 | Disposition: A | Discharge status: Home / Self Care | Payer: PRIVATE HEALTH INSURANCE | Attending: Urology | Admitting: Urology

## 2020-05-04 ENCOUNTER — Ambulatory Visit (HOSPITAL_COMMUNITY): Payer: PRIVATE HEALTH INSURANCE | Admitting: Certified Registered"

## 2020-05-04 ENCOUNTER — Encounter (HOSPITAL_COMMUNITY)
Admission: RE | Disposition: A | Discharge status: Home / Self Care | Payer: Self-pay | Source: Home / Self Care | Attending: Urology

## 2020-05-04 DIAGNOSIS — Z833 Family history of diabetes mellitus: Secondary | ICD-10-CM | POA: Insufficient documentation

## 2020-05-04 DIAGNOSIS — E669 Obesity, unspecified: Secondary | ICD-10-CM | POA: Diagnosis not present

## 2020-05-04 DIAGNOSIS — E7800 Pure hypercholesterolemia, unspecified: Secondary | ICD-10-CM | POA: Diagnosis not present

## 2020-05-04 DIAGNOSIS — Z880 Allergy status to penicillin: Secondary | ICD-10-CM | POA: Diagnosis not present

## 2020-05-04 DIAGNOSIS — N201 Calculus of ureter: Secondary | ICD-10-CM

## 2020-05-04 DIAGNOSIS — G4733 Obstructive sleep apnea (adult) (pediatric): Secondary | ICD-10-CM | POA: Diagnosis not present

## 2020-05-04 DIAGNOSIS — Z8249 Family history of ischemic heart disease and other diseases of the circulatory system: Secondary | ICD-10-CM | POA: Diagnosis not present

## 2020-05-04 DIAGNOSIS — E1140 Type 2 diabetes mellitus with diabetic neuropathy, unspecified: Secondary | ICD-10-CM | POA: Insufficient documentation

## 2020-05-04 DIAGNOSIS — Z882 Allergy status to sulfonamides status: Secondary | ICD-10-CM | POA: Diagnosis not present

## 2020-05-04 DIAGNOSIS — Z881 Allergy status to other antibiotic agents status: Secondary | ICD-10-CM | POA: Insufficient documentation

## 2020-05-04 DIAGNOSIS — Z79899 Other long term (current) drug therapy: Secondary | ICD-10-CM | POA: Diagnosis not present

## 2020-05-04 DIAGNOSIS — I10 Essential (primary) hypertension: Secondary | ICD-10-CM | POA: Insufficient documentation

## 2020-05-04 DIAGNOSIS — Z885 Allergy status to narcotic agent status: Secondary | ICD-10-CM | POA: Insufficient documentation

## 2020-05-04 DIAGNOSIS — Z794 Long term (current) use of insulin: Secondary | ICD-10-CM | POA: Diagnosis not present

## 2020-05-04 HISTORY — PX: CYSTOSCOPY WITH RETROGRADE PYELOGRAM, URETEROSCOPY AND STENT PLACEMENT: SHX5789

## 2020-05-04 HISTORY — PX: HOLMIUM LASER APPLICATION: SHX5852

## 2020-05-04 LAB — POCT I-STAT, CHEM 8
BUN: 10 mg/dL (ref 6–20)
Calcium, Ion: 1.17 mmol/L (ref 1.15–1.40)
Chloride: 101 mmol/L (ref 98–111)
Creatinine, Ser: 0.8 mg/dL (ref 0.44–1.00)
Glucose, Bld: 62 mg/dL — ABNORMAL LOW (ref 70–99)
HCT: 37 % (ref 36.0–46.0)
Hemoglobin: 12.6 g/dL (ref 12.0–15.0)
Potassium: 4.6 mmol/L (ref 3.5–5.1)
Sodium: 141 mmol/L (ref 135–145)
TCO2: 35 mmol/L — ABNORMAL HIGH (ref 22–32)

## 2020-05-04 LAB — GLUCOSE, CAPILLARY
Glucose-Capillary: 76 mg/dL (ref 70–99)
Glucose-Capillary: 63 mg/dL — ABNORMAL LOW (ref 70–99)
Glucose-Capillary: 99 mg/dL (ref 70–99)
Glucose-Capillary: 70 mg/dL (ref 70–99)
Glucose-Capillary: 95 mg/dL (ref 70–99)

## 2020-05-04 SURGERY — CYSTOURETEROSCOPY, WITH RETROGRADE PYELOGRAM AND STENT INSERTION
Anesthesia: General | Laterality: Right

## 2020-05-04 MED ORDER — ONDANSETRON HCL 4 MG/2ML IJ SOLN
INTRAMUSCULAR | Status: AC
  Filled 2020-05-04: qty 2

## 2020-05-04 MED ORDER — HYDROMORPHONE HCL 1 MG/ML IJ SOLN
0.2500 mg | INTRAMUSCULAR | Status: DC | PRN
  Filled 2020-05-04: qty 0.5

## 2020-05-04 MED ORDER — LACTATED RINGERS IV SOLN
Freq: Once | INTRAVENOUS | Status: AC
  Administered 2020-05-04: 1000 mL via INTRAVENOUS

## 2020-05-04 MED ORDER — ORAL CARE MOUTH RINSE: 15.0000 mL | Freq: Once | OROMUCOSAL | Status: AC

## 2020-05-04 MED ORDER — MIDAZOLAM HCL 2 MG/2ML IJ SOLN
INTRAMUSCULAR | Status: AC
  Filled 2020-05-04: qty 2

## 2020-05-04 MED ORDER — DEXTROSE 5 % IV SOLN
Freq: Once | INTRAVENOUS | Status: AC
  Administered 2020-05-04: 250 mL via INTRAVENOUS

## 2020-05-04 MED ORDER — METOCLOPRAMIDE HCL 5 MG/ML IJ SOLN
10.0000 mg | Freq: Once | INTRAMUSCULAR | Status: AC
  Administered 2020-05-04: 10 mg via INTRAVENOUS

## 2020-05-04 MED ORDER — PROPOFOL 10 MG/ML IV BOLUS
INTRAVENOUS | Status: DC | PRN
  Administered 2020-05-04: 200 mg via INTRAVENOUS

## 2020-05-04 MED ORDER — PROPOFOL 10 MG/ML IV BOLUS
INTRAVENOUS | Status: AC
  Filled 2020-05-04: qty 40

## 2020-05-04 MED ORDER — HYDROCODONE-ACETAMINOPHEN 5-325 MG PO TABS
ORAL_TABLET | ORAL | Status: AC
  Filled 2020-05-04: qty 1

## 2020-05-04 MED ORDER — ONDANSETRON HCL 4 MG/2ML IJ SOLN: 4.0000 mg | Freq: Once | INTRAMUSCULAR | Status: DC | PRN

## 2020-05-04 MED ORDER — CHLORHEXIDINE GLUCONATE CLOTH 2 % EX PADS: 6.0000 | MEDICATED_PAD | Freq: Once | CUTANEOUS | Status: DC

## 2020-05-04 MED ORDER — LACTATED RINGERS IV SOLN
INTRAVENOUS | Status: DC | PRN
Start: 2020-05-04 — End: 2020-05-04

## 2020-05-04 MED ORDER — CHLORHEXIDINE GLUCONATE 0.12 % MT SOLN
15.0000 mL | Freq: Once | OROMUCOSAL | Status: AC
  Administered 2020-05-04: 15 mL via OROMUCOSAL

## 2020-05-04 MED ORDER — HYDROCODONE-ACETAMINOPHEN 5-325 MG PO TABS: 1.0000 | ORAL_TABLET | ORAL | 0 refills | Status: DC | PRN

## 2020-05-04 MED ORDER — MIDAZOLAM HCL 5 MG/5ML IJ SOLN
INTRAMUSCULAR | Status: DC | PRN
  Administered 2020-05-04: 2 mg via INTRAVENOUS

## 2020-05-04 MED ORDER — MEPERIDINE HCL 50 MG/ML IJ SOLN: 6.2500 mg | INTRAMUSCULAR | Status: DC | PRN

## 2020-05-04 MED ORDER — FENTANYL CITRATE (PF) 100 MCG/2ML IJ SOLN
INTRAMUSCULAR | Status: DC | PRN
  Administered 2020-05-04 (×2): 50 ug via INTRAVENOUS

## 2020-05-04 MED ORDER — SUCCINYLCHOLINE CHLORIDE 200 MG/10ML IV SOSY
PREFILLED_SYRINGE | INTRAVENOUS | Status: AC
  Filled 2020-05-04: qty 10

## 2020-05-04 MED ORDER — DIATRIZOATE MEGLUMINE 30 % UR SOLN
URETHRAL | Status: AC
  Filled 2020-05-04: qty 100

## 2020-05-04 MED ORDER — ROCURONIUM 10MG/ML (10ML) SYRINGE FOR MEDFUSION PUMP - OPTIME: INTRAVENOUS | Status: DC | PRN

## 2020-05-04 MED ORDER — ONDANSETRON HCL 4 MG/2ML IJ SOLN
INTRAMUSCULAR | Status: DC | PRN
  Administered 2020-05-04: 4 mg via INTRAVENOUS

## 2020-05-04 MED ORDER — METOCLOPRAMIDE HCL 5 MG/ML IJ SOLN
INTRAMUSCULAR | Status: AC
  Filled 2020-05-04: qty 2

## 2020-05-04 MED ORDER — LIDOCAINE 2% (20 MG/ML) 5 ML SYRINGE
INTRAMUSCULAR | Status: AC
  Filled 2020-05-04: qty 5

## 2020-05-04 MED ORDER — FENTANYL CITRATE (PF) 250 MCG/5ML IJ SOLN
INTRAMUSCULAR | Status: AC
  Filled 2020-05-04: qty 5

## 2020-05-04 MED ORDER — CHLORHEXIDINE GLUCONATE 0.12 % MT SOLN
OROMUCOSAL | Status: AC
  Filled 2020-05-04: qty 15

## 2020-05-04 MED ORDER — LIDOCAINE HCL (CARDIAC) PF 50 MG/5ML IV SOSY
PREFILLED_SYRINGE | INTRAVENOUS | Status: DC | PRN
  Administered 2020-05-04: 80 mg via INTRAVENOUS

## 2020-05-04 MED ORDER — ROCURONIUM BROMIDE 10 MG/ML (PF) SYRINGE
PREFILLED_SYRINGE | INTRAVENOUS | Status: AC
  Filled 2020-05-04: qty 10

## 2020-05-04 MED ORDER — SODIUM CHLORIDE 0.9 % IR SOLN
Status: DC | PRN
  Administered 2020-05-04: 3000 mL
  Administered 2020-05-04: 3000 mL via INTRAVESICAL

## 2020-05-04 MED ORDER — SUCCINYLCHOLINE 20MG/ML (10ML) SYRINGE FOR MEDFUSION PUMP - OPTIME
INTRAMUSCULAR | Status: DC | PRN
  Administered 2020-05-04: 120 mg via INTRAVENOUS

## 2020-05-04 MED ORDER — HYDROCODONE-ACETAMINOPHEN 5-325 MG PO TABS
1.0000 | ORAL_TABLET | Freq: Once | ORAL | Status: AC
  Administered 2020-05-04: 1 via ORAL

## 2020-05-04 MED ORDER — DIATRIZOATE MEGLUMINE 30 % UR SOLN
URETHRAL | Status: DC | PRN
  Administered 2020-05-04: 5 mL

## 2020-05-04 MED ORDER — GENTAMICIN SULFATE 40 MG/ML IJ SOLN
5.0000 mg/kg | INTRAVENOUS | Status: DC
  Filled 2020-05-04: qty 7.5

## 2020-05-04 MED ORDER — WATER FOR IRRIGATION, STERILE IR SOLN
Status: DC | PRN
  Administered 2020-05-04: 500 mL

## 2020-05-04 SURGICAL SUPPLY — 27 items
BAG DRAIN URO TABLE W/ADPT NS (BAG) ×3 IMPLANT
BAG DRN 8 ADPR NS SKTRN CSTL (BAG) ×2
BAG HAMPER (MISCELLANEOUS) ×3 IMPLANT
CATH INTERMIT  6FR 70CM (CATHETERS) ×3 IMPLANT
CLOTH BEACON ORANGE TIMEOUT ST (SAFETY) ×3 IMPLANT
DECANTER SPIKE VIAL GLASS SM (MISCELLANEOUS) ×3 IMPLANT
EXTRACTOR STONE NITINOL NGAGE (UROLOGICAL SUPPLIES) ×2 IMPLANT
FIBER LASER FLEXIVA 200 (UROLOGICAL SUPPLIES) ×1 IMPLANT
GLOVE BIO SURGEON STRL SZ8 (GLOVE) ×3 IMPLANT
GLOVE BIOGEL PI IND STRL 6.5 (GLOVE) IMPLANT
GLOVE BIOGEL PI IND STRL 7.0 (GLOVE) ×4 IMPLANT
GLOVE BIOGEL PI INDICATOR 6.5 (GLOVE) ×1
GLOVE BIOGEL PI INDICATOR 7.0 (GLOVE) ×1
GLOVE SURG SS PI 6.5 STRL IVOR (GLOVE) ×1 IMPLANT
GOWN STRL REUS W/TWL LRG LVL3 (GOWN DISPOSABLE) ×3 IMPLANT
GOWN STRL REUS W/TWL XL LVL3 (GOWN DISPOSABLE) ×3 IMPLANT
GUIDEWIRE STR DUAL SENSOR (WIRE) IMPLANT
GUIDEWIRE STR ZIPWIRE 035X150 (MISCELLANEOUS) ×3 IMPLANT
IV NS IRRIG 3000ML ARTHROMATIC (IV SOLUTION) ×6 IMPLANT
KIT TURNOVER CYSTO (KITS) ×3 IMPLANT
MANIFOLD NEPTUNE II (INSTRUMENTS) ×3 IMPLANT
PAD ARMBOARD 7.5X6 YLW CONV (MISCELLANEOUS) ×3 IMPLANT
STENT URET 6FRX24 CONTOUR (STENTS) ×1 IMPLANT
SYR 10ML LL (SYRINGE) ×3 IMPLANT
TOWEL OR 17X26 4PK STRL BLUE (TOWEL DISPOSABLE) ×3 IMPLANT
TRAY CYSTO PACK (CUSTOM PROCEDURE TRAY) ×3 IMPLANT
WATER STERILE IRR 500ML POUR (IV SOLUTION) ×3 IMPLANT

## 2020-05-04 NOTE — Anesthesia Postprocedure Evaluation (Signed)
Anesthesia Post Note  Patient: [name]  Procedure(s) Performed: CYSTOSCOPY WITH RIGHT RETROGRADE PYELOGRAM, RIGHT URETEROSCOPY AND RIGHT URETERAL STENT EXCHANGE (Right ) HOLMIUM LASER LITHOTRIPSY RIGHT URETERAL CALCULUS  Patient location during evaluation: PACU Anesthesia Type: General Level of consciousness: awake and alert and oriented Pain management: pain level controlled Vital Signs Assessment: post-procedure vital signs reviewed and stable Respiratory status: spontaneous breathing Cardiovascular status: blood pressure returned to baseline and stable Postop Assessment: no apparent nausea or vomiting Anesthetic complications: no     Last Vitals:  Vitals:   05/04/20 1515 05/04/20 1521  BP: 119/75   Pulse: 79 81  Resp: 20 (!) 22  Temp:    SpO2: 98% 99%    Last Pain:  Vitals:   05/04/20 1445  TempSrc:   PainSc: Asleep                 Parsa Rickett

## 2020-05-04 NOTE — Anesthesia Procedure Notes (Signed)
Procedure Name: Intubation Date/Time: 05/04/2020 1:45 PM Performed by: Ollen Bowl, CRNA Pre-anesthesia Checklist: Patient identified, Patient being monitored, Timeout performed, Emergency Drugs available and Suction available Patient Re-evaluated:Patient Re-evaluated prior to induction Oxygen Delivery Method: Circle system utilized Preoxygenation: Pre-oxygenation with 100% oxygen Induction Type: IV induction Ventilation: Mask ventilation without difficulty Laryngoscope Size: Mac and 3 Grade View: Grade I Tube type: Oral Tube size: 7.0 mm Number of attempts: 1 Airway Equipment and Method: Stylet Placement Confirmation: ETT inserted through vocal cords under direct vision,  positive ETCO2 and breath sounds checked- equal and bilateral Secured at: 21 cm Tube secured with: Tape Dental Injury: Teeth and Oropharynx as per pre-operative assessment

## 2020-05-04 NOTE — Interval H&P Note (Signed)
History and Physical Interval Note:  05/04/2020 1:09 PM  [name]  has presented today for surgery, with the diagnosis of right ureteral calculus.  The various methods of treatment have been discussed with the patient and family. After consideration of risks, benefits and other options for treatment, the patient has consented to  Procedure(s): CYSTOSCOPY WITH RETROGRADE PYELOGRAM, URETEROSCOPY AND STENT EXCHANGE (Right) as a surgical intervention.  The patient's history has been reviewed, patient examined, no change in status, stable for surgery.  I have reviewed the patient's chart and labs.  Questions were answered to the patient's satisfaction.     Wilkie Aye

## 2020-05-04 NOTE — Transfer of Care (Signed)
Immediate Anesthesia Transfer of Care Note  Patient: [name]  Procedure(s) Performed: CYSTOSCOPY WITH RIGHT RETROGRADE PYELOGRAM, RIGHT URETEROSCOPY AND RIGHT URETERAL STENT EXCHANGE (Right ) HOLMIUM LASER LITHOTRIPSY RIGHT URETERAL CALCULUS  Patient Location: PACU  Anesthesia Type:General  Level of Consciousness: awake  Airway & Oxygen Therapy: Patient Spontanous Breathing  Post-op Assessment: Report given to RN  Post vital signs: Reviewed and stable  Last Vitals:  Vitals Value Taken Time  BP 131/73 05/04/20 1445  Temp    Pulse 78 05/04/20 1447  Resp 18 05/04/20 1447  SpO2 97 % 05/04/20 1447  Vitals shown include unvalidated device data.  Last Pain:  Vitals:   05/04/20 1144  TempSrc: Oral  PainSc: 0-No pain      Patients Stated Pain Goal: 8 (05/04/20 1144)  Complications: No apparent anesthesia complications

## 2020-05-04 NOTE — Brief Op Note (Signed)
05/04/2020  2:20 PM  PATIENT:  [name]  55 y.o. female  PRE-OPERATIVE DIAGNOSIS:  right ureteral calculus  POST-OPERATIVE DIAGNOSIS: same  PROCEDURE:  Procedure(s): CYSTOSCOPY WITH RIGHT RETROGRADE PYELOGRAM, RIGHT URETEROSCOPY AND RIGHT URETERAL STENT EXCHANGE (Right) HOLMIUM LASER LITHOTRIPSY RIGHT URETERAL CALCULUS  SURGEON:  Surgeon(s) and Role:    * Clotee Schlicker, Mardene Celeste, MD - Primary  PHYSICIAN ASSISTANT:   ASSISTANTS: none   ANESTHESIA:   general  EBL:  minimal   BLOOD ADMINISTERED:none  DRAINS: right 6x24 JJ stent with tether   LOCAL MEDICATIONS USED:  NONE  SPECIMEN:  Source of Specimen:  stone for analysis  DISPOSITION OF SPECIMEN:  PATHOLOGY  COUNTS:  YES  TOURNIQUET:  * No tourniquets in log *  DICTATION: .Note written in EPIC  PLAN OF CARE: Discharge to home after PACU  PATIENT DISPOSITION:  PACU - hemodynamically stable.   Delay start of Pharmacological VTE agent (>24hrs) due to surgical blood loss or risk of bleeding: not applicable

## 2020-05-04 NOTE — Discharge Instructions (Signed)
Ureteral Stent Implantation, Care After This sheet gives you information about how to care for yourself after your procedure. Your health care provider may also give you more specific instructions. If you have problems or questions, contact your health care provider. What can I expect after the procedure? After the procedure, it is common to have:  Nausea.  Mild pain when you urinate. You may feel this pain in your lower back or lower abdomen. The pain should stop within a few minutes after you urinate. This may last for up to 1 week.  A small amount of blood in your urine for several days. Follow these instructions at home: Medicines  Take over-the-counter and prescription medicines only as told by your health care provider.  If you were prescribed an antibiotic medicine, take it as told by your health care provider. Do not stop taking the antibiotic even if you start to feel better.  Do not drive for 24 hours if you were given a sedative during your procedure.  Ask your health care provider if the medicine prescribed to you requires you to avoid driving or using heavy machinery. Activity  Rest as told by your health care provider.  Avoid sitting for a long time without moving. Get up to take short walks every 1-2 hours. This is important to improve blood flow and breathing. Ask for help if you feel weak or unsteady.  Return to your normal activities as told by your health care provider. Ask your health care provider what activities are safe for you. General instructions   Watch for any blood in your urine. Call your health care provider if the amount of blood in your urine increases.  If you have a catheter: ? Follow instructions from your health care provider about taking care of your catheter and collection bag. ? Do not take baths, swim, or use a hot tub until your health care provider approves. Ask your health care provider if you may take showers. You may only be allowed to  take sponge baths.  Drink enough fluid to keep your urine pale yellow.  Do not use any products that contain nicotine or tobacco, such as cigarettes, e-cigarettes, and chewing tobacco. These can delay healing after surgery. If you need help quitting, ask your health care provider.  Keep all follow-up visits as told by your health care provider. This is important. Contact a health care provider if:  You have pain that gets worse or does not get better with medicine, especially pain when you urinate.  You have difficulty urinating.  You feel nauseous or you vomit repeatedly during a period of more than 2 days after the procedure. Get help right away if:  Your urine is dark red or has blood clots in it.  You are leaking urine (have incontinence).  The end of the stent comes out of your urethra.  You cannot urinate.  You have sudden, sharp, or severe pain in your abdomen or lower back.  You have a fever.  You have swelling or pain in your legs.  You have difficulty breathing. Summary  After the procedure, it is common to have mild pain when you urinate that goes away within a few minutes after you urinate. This may last for up to 1 week.  Watch for any blood in your urine. Call your health care provider if the amount of blood in your urine increases.  Take over-the-counter and prescription medicines only as told by your health care provider.  Drink   enough fluid to keep your urine pale yellow. This information is not intended to replace advice given to you by your health care provider. Make sure you discuss any questions you have with your health care provider. Document Revised: 09/02/2018 Document Reviewed: 09/03/2018 Elsevier Patient Education  2020 Elsevier Inc.  PLEASE REMOVE YOUR STENT IN 72 HOURS BY GENTLY PULLING THE STRING      General Anesthesia, Adult, Care After This sheet gives you information about how to care for yourself after your procedure. Your health  care provider may also give you more specific instructions. If you have problems or questions, contact your health care provider. What can I expect after the procedure? After the procedure, the following side effects are common:  Pain or discomfort at the IV site.  Nausea.  Vomiting.  Sore throat.  Trouble concentrating.  Feeling cold or chills.  Weak or tired.  Sleepiness and fatigue.  Soreness and body aches. These side effects can affect parts of the body that were not involved in surgery. Follow these instructions at home:  For at least 24 hours after the procedure:  Have a responsible adult stay with you. It is important to have someone help care for you until you are awake and alert.  Rest as needed.  Do not: ? Participate in activities in which you could fall or become injured. ? Drive. ? Use heavy machinery. ? Drink alcohol. ? Take sleeping pills or medicines that cause drowsiness. ? Make important decisions or sign legal documents. ? Take care of children on your own. Eating and drinking  Follow any instructions from your health care provider about eating or drinking restrictions.  When you feel hungry, start by eating small amounts of foods that are soft and easy to digest (bland), such as toast. Gradually return to your regular diet.  Drink enough fluid to keep your urine pale yellow.  If you vomit, rehydrate by drinking water, juice, or clear broth. General instructions  If you have sleep apnea, surgery and certain medicines can increase your risk for breathing problems. Follow instructions from your health care provider about wearing your sleep device: ? Anytime you are sleeping, including during daytime naps. ? While taking prescription pain medicines, sleeping medicines, or medicines that make you drowsy.  Return to your normal activities as told by your health care provider. Ask your health care provider what activities are safe for you.  Take  over-the-counter and prescription medicines only as told by your health care provider.  If you smoke, do not smoke without supervision.  Keep all follow-up visits as told by your health care provider. This is important. Contact a health care provider if:  You have nausea or vomiting that does not get better with medicine.  You cannot eat or drink without vomiting.  You have pain that does not get better with medicine.  You are unable to pass urine.  You develop a skin rash.  You have a fever.  You have redness around your IV site that gets worse. Get help right away if:  You have difficulty breathing.  You have chest pain.  You have blood in your urine or stool, or you vomit blood. Summary  After the procedure, it is common to have a sore throat or nausea. It is also common to feel tired.  Have a responsible adult stay with you for the first 24 hours after general anesthesia. It is important to have someone help care for you until you are awake   and alert.  When you feel hungry, start by eating small amounts of foods that are soft and easy to digest (bland), such as toast. Gradually return to your regular diet.  Drink enough fluid to keep your urine pale yellow.  Return to your normal activities as told by your health care provider. Ask your health care provider what activities are safe for you. This information is not intended to replace advice given to you by your health care provider. Make sure you discuss any questions you have with your health care provider. Document Revised: 11/29/2017 Document Reviewed: 07/12/2017 Elsevier Patient Education  2020 Elsevier Inc.  

## 2020-05-04 NOTE — Anesthesia Preprocedure Evaluation (Signed)
Anesthesia Evaluation  Patient identified by MRN, date of birth, ID band Patient awake    Reviewed: Allergy & Precautions, NPO status , Patient's Chart, lab work & pertinent test results  History of Anesthesia Complications Negative for: history of anesthetic complications  Airway Mallampati: II  TM Distance: >3 FB Neck ROM: Full    Dental  (+) Teeth Intact, Dental Advisory Given   Pulmonary shortness of breath and with exertion, sleep apnea , pneumonia (covid pneuminia), resolved,    Pulmonary exam normal breath sounds clear to auscultation       Cardiovascular hypertension, Pt. on medications Normal cardiovascular exam Rhythm:Regular Rate:Normal     Neuro/Psych  Headaches, PSYCHIATRIC DISORDERS Depression    GI/Hepatic Neg liver ROS, GERD  Medicated and Poorly Controlled,  Endo/Other  diabetes, Well Controlled, Type 1, Insulin Dependent  Renal/GU Renal disease (renal stones)     Musculoskeletal  (+) Arthritis ,   Abdominal   Peds  Hematology   Anesthesia Other Findings   Reproductive/Obstetrics                             Anesthesia Physical Anesthesia Plan  ASA: III  Anesthesia Plan: General   Post-op Pain Management:    Induction: Intravenous  PONV Risk Score and Plan: Ondansetron, Midazolam and Metaclopromide  Airway Management Planned: Oral ETT  Additional Equipment:   Intra-op Plan:   Post-operative Plan: Extubation in OR  Informed Consent: I have reviewed the patients History and Physical, chart, labs and discussed the procedure including the risks, benefits and alternatives for the proposed anesthesia with the patient or authorized representative who has indicated his/her understanding and acceptance.     Dental advisory given  Plan Discussed with: CRNA  Anesthesia Plan Comments:         Anesthesia Quick Evaluation

## 2020-05-10 NOTE — Op Note (Signed)
Preoperative diagnosis: Right ureteral stone  Postoperative diagnosis: Same  Procedure: 1 cystoscopy 2 right retrograde pyelography 3.  Intraoperative fluoroscopy, under one hour, with interpretation 4.  Right ureteroscopic stone manipulation with laser lithotripsy 5.  Right 6 x 24 JJ stent exchange  Attending: Cleda Mccreedy  Anesthesia: General  Estimated blood loss: None  Drains: Right 6 x 24 JJ ureteral stent with tether  Specimens: stone for analysis  Antibiotics: ancef  Findings: right mid ureteral calculus. No hydronephrosis.  Indications: Patient is a 54 year old female with a history of ureteral stone and who had a stent placed previously for infection.  After discussing treatment options, she decided proceed with right ureteroscopic stone manipulation.  Procedure her in detail: The patient was brought to the operating room and a brief timeout was done to ensure correct patient, correct procedure, correct site.  General anesthesia was administered patient was placed in dorsal lithotomy position.  Her genitalia was then prepped and draped in usual sterile fashion.  A rigid 22 French cystoscope was passed in the urethra and the bladder.  Bladder was inspected free masses or lesions.  the ureteral orifices were in the normal orthotopic locations. Using a grasper the right ureteral stent was brought to the urethral meatus. A zipwire was advanced through the stent and up to the renal pelvis. The stent was removed. a 6 french ureteral catheter was then instilled into the right ureter orifice.  a gentle retrograde was obtained and findings noted above.  we then removed the cystoscope and cannulated the right ureteral orifice with a semirigid ureteroscope.  we then encountered the stone in the mid ureter.  using a 200 nm laser fiber and fragmented the stone into smaller pieces.  the pieces were then removed with a engage basket.     once all stone fragments were removed we then placed a  6 x 24 double-j ureteral stent over the original zip wire. We then removed the wire and good coil was noted in the the renal pelvis under fluoroscopy and the bladder under direct vision.  the stone fragments were then removed from the bladder and sent for analysis.   the bladder was then drained and this concluded the procedure which was well tolerated by patient.  Complications: None  Condition: Stable, extubated, transferred to PACU  Plan: Patient is to be discharged home as to follow-up in one week. She is to remove her stent in 72 hours by pulling the tether

## 2020-05-11 ENCOUNTER — Ambulatory Visit: Payer: No Typology Code available for payment source | Admitting: Urology

## 2020-05-12 ENCOUNTER — Other Ambulatory Visit (HOSPITAL_COMMUNITY): Payer: No Typology Code available for payment source

## 2020-05-12 LAB — CALCULI, WITH PHOTOGRAPH (CLINICAL LAB)
Calcium Oxalate Dihydrate: 20 %
Calcium Oxalate Monohydrate: 75 %
Hydroxyapatite: 5 %
Weight Calculi: 8 mg

## 2020-05-13 ENCOUNTER — Other Ambulatory Visit (HOSPITAL_COMMUNITY): Payer: No Typology Code available for payment source

## 2020-05-24 ENCOUNTER — Telehealth: Payer: Self-pay

## 2020-05-24 NOTE — Telephone Encounter (Signed)
Received pre-certification request form from [facility]. Form and clinical notes faxed to [facility].

## 2020-05-24 NOTE — Telephone Encounter (Signed)
Called [facility], spoke to [place]. Started PA for TCS/EGD/DIL. Casimer Bilis will fax form to our office. Need to fax form back with clinical notes to [phone].

## 2020-05-25 ENCOUNTER — Other Ambulatory Visit (HOSPITAL_COMMUNITY)
Admission: AD | Admit: 2020-05-25 | Discharge: 2020-05-25 | Disposition: A | Discharge status: Home / Self Care | Payer: No Typology Code available for payment source | Source: Skilled Nursing Facility | Attending: Urology | Admitting: Urology

## 2020-05-25 ENCOUNTER — Encounter: Payer: Self-pay | Admitting: Urology

## 2020-05-25 ENCOUNTER — Other Ambulatory Visit: Payer: Self-pay | Admitting: Urology

## 2020-05-25 ENCOUNTER — Ambulatory Visit (INDEPENDENT_AMBULATORY_CARE_PROVIDER_SITE_OTHER): Payer: PRIVATE HEALTH INSURANCE | Admitting: Urology

## 2020-05-25 ENCOUNTER — Other Ambulatory Visit: Payer: Self-pay

## 2020-05-25 VITALS — BP 134/73 | HR 82 | Temp 97.9°F | Ht <= 58 in | Wt 200.0 lb

## 2020-05-25 DIAGNOSIS — N200 Calculus of kidney: Secondary | ICD-10-CM | POA: Diagnosis not present

## 2020-05-25 LAB — POCT URINALYSIS DIPSTICK
Bilirubin, UA: NEGATIVE
Glucose, UA: NEGATIVE
Ketones, UA: NEGATIVE
Nitrite, UA: NEGATIVE
Protein, UA: POSITIVE — AB
Spec Grav, UA: 1.01 (ref 1.010–1.025)
Urobilinogen, UA: 0.2 E.U./dL
pH, UA: 6 (ref 5.0–8.0)

## 2020-05-25 LAB — BLADDER SCAN AMB NON-IMAGING: Scan Result: 51.7

## 2020-05-25 MED ORDER — SULFAMETHOXAZOLE-TRIMETHOPRIM 800-160 MG PO TABS: 1.0000 | ORAL_TABLET | Freq: Two times a day (BID) | ORAL | 0 refills | Status: DC

## 2020-05-25 NOTE — Progress Notes (Signed)
Urological Symptom Review  Patient is experiencing the following symptoms: Leakage of urine Trouble starting stream Have to strain to urinate Weak stream   Review of Systems  Gastrointestinal (upper)  : Negative for upper GI symptoms  Gastrointestinal (lower) : Negative for lower GI symptoms  Constitutional : Negative for symptoms  Skin: Negative for skin symptoms  Eyes: Negative for eye symptoms  Ear/Nose/Throat : Negative for Ear/Nose/Throat symptoms  Hematologic/Lymphatic: Negative for Hematologic/Lymphatic symptoms  Cardiovascular : Negative for cardiovascular symptoms  Respiratory : Negative for respiratory symptoms  Endocrine: Negative for endocrine symptoms  Musculoskeletal: Negative for musculoskeletal symptoms  Neurological: Negative for neurological symptoms  Psychologic: Negative for psychiatric symptoms

## 2020-05-25 NOTE — Patient Instructions (Signed)
Dietary Guidelines to Help Prevent Kidney Stones Kidney stones are deposits of minerals and salts that form inside your kidneys. Your risk of developing kidney stones may be greater depending on your diet, your lifestyle, the medicines you take, and whether you have certain medical conditions. Most people can reduce their chances of developing kidney stones by following the instructions below. Depending on your overall health and the type of kidney stones you tend to develop, your dietitian may give you more specific instructions. What are tips for following this plan? Reading food labels  Choose foods with "no salt added" or "low-salt" labels. Limit your sodium intake to less than 1500 mg per day.  Choose foods with calcium for each meal and snack. Try to eat about 300 mg of calcium at each meal. Foods that contain 200-500 mg of calcium per serving include: ? 8 oz (237 ml) of milk, fortified nondairy milk, and fortified fruit juice. ? 8 oz (237 ml) of kefir, yogurt, and soy yogurt. ? 4 oz (118 ml) of tofu. ? 1 oz of cheese. ? 1 cup (300 g) of dried figs. ? 1 cup (91 g) of cooked broccoli. ? 1-3 oz can of sardines or mackerel.  Most people need 1000 to 1500 mg of calcium each day. Talk to your dietitian about how much calcium is recommended for you. Shopping  Buy plenty of fresh fruits and vegetables. Most people do not need to avoid fruits and vegetables, even if they contain nutrients that may contribute to kidney stones.  When shopping for convenience foods, choose: ? Whole pieces of fruit. ? Premade salads with dressing on the side. ? Low-fat fruit and yogurt smoothies.  Avoid buying frozen meals or prepared deli foods.  Look for foods with live cultures, such as yogurt and kefir. Cooking  Do not add salt to food when cooking. Place a salt shaker on the table and allow each person to add his or her own salt to taste.  Use vegetable protein, such as beans, textured vegetable  protein (TVP), or tofu instead of meat in pasta, casseroles, and soups. Meal planning   Eat less salt, if told by your dietitian. To do this: ? Avoid eating processed or premade food. ? Avoid eating fast food.  Eat less animal protein, including cheese, meat, poultry, or fish, if told by your dietitian. To do this: ? Limit the number of times you have meat, poultry, fish, or cheese each week. Eat a diet free of meat at least 2 days a week. ? Eat only one serving each day of meat, poultry, fish, or seafood. ? When you prepare animal protein, cut pieces into small portion sizes. For most meat and fish, one serving is about the size of one deck of cards.  Eat at least 5 servings of fresh fruits and vegetables each day. To do this: ? Keep fruits and vegetables on hand for snacks. ? Eat 1 piece of fruit or a handful of berries with breakfast. ? Have a salad and fruit at lunch. ? Have two kinds of vegetables at dinner.  Limit foods that are high in a substance called oxalate. These include: ? Spinach. ? Rhubarb. ? Beets. ? Potato chips and french fries. ? Nuts.  If you regularly take a diuretic medicine, make sure to eat at least 1-2 fruits or vegetables high in potassium each day. These include: ? Avocado. ? Banana. ? Orange, prune, carrot, or tomato juice. ? Baked potato. ? Cabbage. ? Beans and split   peas. General instructions   Drink enough fluid to keep your urine clear or pale yellow. This is the most important thing you can do.  Talk to your health care provider and dietitian about taking daily supplements. Depending on your health and the cause of your kidney stones, you may be advised: ? Not to take supplements with vitamin C. ? To take a calcium supplement. ? To take a daily probiotic supplement. ? To take other supplements such as magnesium, fish oil, or vitamin B6.  Take all medicines and supplements as told by your health care provider.  Limit alcohol intake to no  more than 1 drink a day for nonpregnant women and 2 drinks a day for men. One drink equals 12 oz of beer, 5 oz of wine, or 1 oz of hard liquor.  Lose weight if told by your health care provider. Work with your dietitian to find strategies and an eating plan that works best for you. What foods are not recommended? Limit your intake of the following foods, or as told by your dietitian. Talk to your dietitian about specific foods you should avoid based on the type of kidney stones and your overall health. Grains Breads. Bagels. Rolls. Baked goods. Salted crackers. Cereal. Pasta. Vegetables Spinach. Rhubarb. Beets. Canned vegetables. Pickles. Olives. Meats and other protein foods Nuts. Nut butters. Large portions of meat, poultry, or fish. Salted or cured meats. Deli meats. Hot dogs. Sausages. Dairy Cheese. Beverages Regular soft drinks. Regular vegetable juice. Seasonings and other foods Seasoning blends with salt. Salad dressings. Canned soups. Soy sauce. Ketchup. Barbecue sauce. Canned pasta sauce. Casseroles. Pizza. Lasagna. Frozen meals. Potato chips. French fries. Summary  You can reduce your risk of kidney stones by making changes to your diet.  The most important thing you can do is drink enough fluid. You should drink enough fluid to keep your urine clear or pale yellow.  Ask your health care provider or dietitian how much protein from animal sources you should eat each day, and also how much salt and calcium you should have each day. This information is not intended to replace advice given to you by your health care provider. Make sure you discuss any questions you have with your health care provider. Document Revised: 03/18/2019 Document Reviewed: 11/06/2016 Elsevier Patient Education  2020 Elsevier Inc.  

## 2020-05-25 NOTE — Progress Notes (Signed)
05/25/2020 3:53 PM   Evon Slack 1965/12/13 [id number]  Referring provider: Celene Squibb, MD [address] [place],  [place] [address]  nephrolithiasis  HPI: [name] is a 54yo here for followup for nephrolithiasis. She underwent ureteroscopic stone extraction on 5/26. She removed her stent 3 days later. Stone composition was CaOx. She has not had a metabolic evaluation for calculi. She has intermittent left flank pain. She has a feeling of incomplete emptying    PMH: Past Medical History:  Diagnosis Date  . Diabetes mellitus    insulin pump 2013  . Hypercholesteremia   . Hypertension   . Kidney stone   . Neuropathy   . Obesity   . Obstructive sleep apnea   . Palpitations   . Shortness of breath     Surgical History: Past Surgical History:  Procedure Laterality Date  . CARDIAC CATHETERIZATION  12/11/2012   normal coronary arteries  . CESAREAN SECTION  1994;2000   [facility]  . CHOLECYSTECTOMY  1992  . CYSTOSCOPY W/ URETERAL STENT PLACEMENT  05/08/2012   Procedure: CYSTOSCOPY WITH RETROGRADE PYELOGRAM/URETERAL STENT PLACEMENT;  Surgeon: Marissa Nestle, MD;  Location: [facility];  Service: Urology;  Laterality: Right;  . CYSTOSCOPY WITH RETROGRADE PYELOGRAM, URETEROSCOPY AND STENT PLACEMENT Right 05/04/2020   Procedure: CYSTOSCOPY WITH RIGHT RETROGRADE PYELOGRAM, RIGHT URETEROSCOPY AND RIGHT URETERAL STENT EXCHANGE;  Surgeon: Cleon Gustin, MD;  Location: [facility];  Service: Urology;  Laterality: Right;  . CYSTOSCOPY WITH STENT PLACEMENT Right 02/25/2020   Procedure: CYSTOSCOPY WITH RIGHT RETROGRADE RIGHT STENT PLACEMENT;  Surgeon: Festus Aloe, MD;  Location: [facility];  Service: Urology;  Laterality: Right;  . FOOT SURGERY Right March, 2013   [facility]-removal of bone spur  . HOLMIUM LASER APPLICATION  02/22/1760   Procedure: HOLMIUM LASER LITHOTRIPSY RIGHT URETERAL CALCULUS;  Surgeon: Cleon Gustin, MD;  Location: [facility];  Service: Urology;;  .  HYSTEROSCOPY WITH THERMACHOICE  04/25/2012   Procedure: HYSTEROSCOPY WITH THERMACHOICE;  Surgeon: Florian Buff, MD;  Location: [facility];  Service: Gynecology;  Laterality: N/A;  total therapy time= 11 minutes 42 seconds; 34 ml D5w  in and 34 ml D5w out; temp =87 degrees F  . LEFT HEART CATHETERIZATION WITH CORONARY ANGIOGRAM N/A 12/11/2012   Procedure: LEFT HEART CATHETERIZATION WITH CORONARY ANGIOGRAM;  Surgeon: Lorretta Harp, MD;  Location: [facility] CATH LAB;  Service: Cardiovascular;  Laterality: N/A;  . Sinus [place]  . UMBILICAL HERNIA REPAIR N/A 03/25/2014   Procedure: UMBILICAL HERNIA REPAIR;  Surgeon: Scherry Ran, MD;  Location: [facility];  Service: General;  Laterality: N/A;  . uterine ablation      Home Medications:  Allergies as of 05/25/2020      Reactions   Ciprofloxacin Hives, Swelling   Penicillins Anaphylaxis, Rash   Codeine Other (See Comments)   REACTION:  had hallicunations   Morphine Nausea And Vomiting, Other (See Comments)   REACTION:   recieved in ED due to [facility] and had multple doses - gave visual hallucinations and vomitting.   Sulfonamide Derivatives Other (See Comments)   REACTION: Unsure - childhood allergy   Vancomycin Itching      Medication List       Accurate as of May 25, 2020  3:53 PM. If you have any questions, ask your nurse or doctor.        albuterol 108 (90 Base) MCG/ACT inhaler Commonly known as: VENTOLIN HFA Inhale 2 puffs into  the lungs every 6 (six) hours as needed for wheezing or shortness of breath.   ALPRAZolam 1 MG tablet Commonly known as: XANAX Take 1 mg by mouth at bedtime.   ARIPiprazole 2 MG tablet Commonly known as: ABILIFY Take 2 mg by mouth at bedtime.   buPROPion 150 MG 24 hr tablet Commonly known as: WELLBUTRIN XL Take 150 mg by mouth daily.   DULoxetine 60 MG capsule Commonly known as: CYMBALTA Take 60 mg by mouth 2 (two) times daily.   furosemide 20 MG tablet Commonly known as:  LASIX Take 40 mg by mouth daily.   gabapentin 600 MG tablet Commonly known as: NEURONTIN Take 600 mg by mouth 3 (three) times daily.   HumaLOG 100 UNIT/ML injection Generic drug: insulin lispro Inject into the skin continuous. 3.5 unirs every 1 hr-insulin pump   HYDROcodone-acetaminophen 5-325 MG tablet Commonly known as: NORCO/VICODIN Take 1-2 tablets by mouth every 4 (four) hours as needed.   LORazepam 0.5 MG tablet Commonly known as: ATIVAN Take 0.5 mg by mouth daily as needed for anxiety.   multivitamin with minerals Tabs tablet Take 1 tablet by mouth daily.   omeprazole 40 MG capsule Commonly known as: PRILOSEC Take 40 mg by mouth daily.   oxybutynin 5 MG 24 hr tablet Commonly known as: DITROPAN-XL Take 5 mg by mouth daily.   phentermine 37.5 MG capsule Take 37.5 mg by mouth daily.   potassium chloride 20 MEQ/15ML (10%) Soln   simvastatin 40 MG tablet Commonly known as: ZOCOR Take 40 mg by mouth daily.       Allergies:  Allergies  Allergen Reactions  . Ciprofloxacin Hives and Swelling  . Penicillins Anaphylaxis and Rash  . Codeine Other (See Comments)    REACTION:  had hallicunations  . Morphine Nausea And Vomiting and Other (See Comments)    REACTION:   recieved in ED due to [facility] and had multple doses - gave visual hallucinations and vomitting.  . Sulfonamide Derivatives Other (See Comments)    REACTION: Unsure - childhood allergy  . Vancomycin Itching    Family History: Family History  Problem Relation Age of Onset  . Diabetes Mother   . Depression Paternal Grandmother   . Depression Paternal Grandfather   . Heart disease Other   . Cancer Other   . Diabetes Other   . Achalasia Other   . Anesthesia problems Neg Hx   . Hypotension Neg Hx   . Malignant hyperthermia Neg Hx   . Pseudochol deficiency Neg Hx     Social History:  reports that she has never smoked. She has never used smokeless tobacco. She reports that she does not drink  alcohol and does not use drugs.  ROS: All other review of systems were reviewed and are negative except what is noted above in HPI  Physical Exam: BP 134/73   Pulse 82   Temp 97.9 F (36.6 C)   Ht 4\' 10"  (1.473 m)   Wt 200 lb (90.7 kg)   LMP 07/16/2012   BMI 41.80 kg/m   Constitutional:  Alert and oriented, No acute distress. HEENT: [place] AT, moist mucus membranes.  Trachea midline, no masses. Cardiovascular: No clubbing, cyanosis, or edema. Respiratory: Normal respiratory effort, no increased work of breathing. GI: Abdomen is soft, nontender, nondistended, no abdominal masses GU: No CVA tenderness.  Lymph: No cervical or inguinal lymphadenopathy. Skin: No rashes, bruises or suspicious lesions. Neurologic: Grossly intact, no focal deficits, moving all 4 extremities. Psychiatric: Normal mood and  affect.  Laboratory Data: Lab Results  Component Value Date   WBC 11.4 (H) 05/02/2020   HGB 12.6 05/04/2020   HCT 37.0 05/04/2020   MCV 86.4 05/02/2020   PLT 384 05/02/2020    Lab Results  Component Value Date   CREATININE 0.80 05/04/2020    No results found for: PSA  No results found for: TESTOSTERONE  Lab Results  Component Value Date   HGBA1C 15.1 (H) 02/25/2020    Urinalysis    Component Value Date/Time   COLORURINE AMBER (A) 04/04/2020 0309   APPEARANCEUR CLOUDY (A) 04/04/2020 0309   LABSPEC 1.020 04/04/2020 0309   PHURINE 6.0 04/04/2020 0309   GLUCOSEU NEGATIVE 04/04/2020 0309   HGBUR LARGE (A) 04/04/2020 0309   HGBUR moderate 08/31/2008 0908   BILIRUBINUR negative 04/04/2020 1454   KETONESUR NEGATIVE 04/04/2020 0309   PROTEINUR Positive (A) 04/04/2020 1454   PROTEINUR 100 (A) 04/04/2020 0309   UROBILINOGEN 0.2 04/04/2020 1454   UROBILINOGEN 0.2 09/21/2014 1055   NITRITE negative 04/04/2020 1454   NITRITE NEGATIVE 04/04/2020 0309   LEUKOCYTESUR Moderate (2+) (A) 04/04/2020 1454   LEUKOCYTESUR SMALL (A) 04/04/2020 0309    Lab Results  Component Value  Date   BACTERIA NONE SEEN 04/04/2020    Pertinent Imaging:  Results for orders placed during the hospital encounter of 06/01/09  DG Abd 1 View  Narrative Clinical Data: Right side pain.  History of urinary tract stones.  ABDOMEN - 1 VIEW  Comparison: Abdomen 06/21/2008.  Findings: As on the prior study, there is a calcification projecting over the right kidney consistent with a 0.9 cm stone. As on the prior study, the stone may be in the right renal pelvis. No new unexpected abdominal calcification is identified.  Bowel gas pattern is unremarkable.  No focal bony abnormality.  IMPRESSION: Unchanged 0.9 cm right renal stone.  Provider: Wilkie Aye  No results found for this or any previous visit.  No results found for this or any previous visit.  No results found for this or any previous visit.  No results found for this or any previous visit.  No results found for this or any previous visit.  No results found for this or any previous visit.  Results for orders placed during the hospital encounter of 04/04/20  CT RENAL STONE STUDY  Narrative CLINICAL DATA:  Left flank pain and hematuria  EXAM: CT ABDOMEN AND PELVIS WITHOUT CONTRAST  TECHNIQUE: Multidetector CT imaging of the abdomen and pelvis was performed following the standard protocol without IV contrast.  COMPARISON:  February 25, 2020  FINDINGS: Lower chest: The visualized heart size within normal limits. No pericardial fluid/thickening.  No hiatal hernia.  The visualized portions of the lungs are clear.  Hepatobiliary: Although limited due to the lack of intravenous contrast, normal in appearance without gross focal abnormality. The patient is status post cholecystectomy. No biliary ductal dilation.  Pancreas:  Unremarkable.  No surrounding inflammatory changes.  Spleen: Normal in size. Although limited due to the lack of intravenous contrast, normal in appearance.  Adrenals/Urinary Tract:  Both adrenal glands appear normal. There is been interval placement a right-sided double-J ureteral catheter. There is a clustered 1 cm calculus seen within the lower pole of the right kidney. There appears to be mild stranding changes seen around the right ureter extending to the UVJ. There remains mild right ureterectasis present. There is ureterectasis. Again noted are multiple clustered calcifications within the upper pole left kidney extending into the renal pelvis measuring  1.7 cm. There is tiny punctate calcification pole the left kidney measuring 3 mm. No left-sided hydronephrosis is seen.  Stomach/Bowel: The stomach, small bowel, and colon are normal in appearance. No inflammatory changes or obstructive findings. appendix is normal.  Vascular/Lymphatic: There are no enlarged abdominal or pelvic lymph nodes. No significant gross vascular findings are present.  Reproductive: The uterus and adnexa are unremarkable.  Other: No evidence of abdominal wall mass or hernia.  Musculoskeletal: No acute or significant osseous findings.  IMPRESSION: 1. Interval placement right-sided double-J ureteral stent with mild residual ureterectasis. There is also periureteral fat stranding changes seen to the level of the UVJ which could be due to mild ureteritis. 2. Unchanged bilateral renal calculi   Electronically Signed By: Jonna Clark M.D. On: 04/04/2020 02:13   Assessment & Plan:    1. Kidney stones -BMP, uric acid, iPTH, 24 hour urine. -RTC 4-6 weeks with renal US - POCT urinalysis dipstick   No follow-ups on file.  Wilkie Aye, MD  [facility] [place]

## 2020-05-26 ENCOUNTER — Other Ambulatory Visit (HOSPITAL_COMMUNITY): Payer: No Typology Code available for payment source

## 2020-05-26 LAB — BASIC METABOLIC PANEL
BUN: 14 mg/dL (ref 7–25)
CO2: 32 mmol/L (ref 20–32)
Calcium: 9.4 mg/dL (ref 8.6–10.4)
Chloride: 99 mmol/L (ref 98–110)
Creat: 0.89 mg/dL (ref 0.50–1.05)
Glucose, Bld: 137 mg/dL — ABNORMAL HIGH (ref 65–99)
Potassium: 4 mmol/L (ref 3.5–5.3)
Sodium: 139 mmol/L (ref 135–146)

## 2020-05-26 LAB — URIC ACID: Uric Acid, Serum: 5.1 mg/dL (ref 2.5–7.0)

## 2020-05-26 LAB — PARATHYROID HORMONE, INTACT (NO CA): PTH: 53 pg/mL (ref 14–64)

## 2020-05-27 ENCOUNTER — Other Ambulatory Visit (HOSPITAL_COMMUNITY): Payer: No Typology Code available for payment source

## 2020-05-27 LAB — URINE CULTURE

## 2020-05-30 ENCOUNTER — Ambulatory Visit: Payer: No Typology Code available for payment source | Admitting: Urology

## 2020-06-01 ENCOUNTER — Ambulatory Visit: Payer: No Typology Code available for payment source | Admitting: Urology

## 2020-06-07 ENCOUNTER — Other Ambulatory Visit: Payer: Self-pay

## 2020-06-07 ENCOUNTER — Telehealth: Payer: Self-pay | Admitting: Internal Medicine

## 2020-06-07 MED ORDER — NA SULFATE-K SULFATE-MG SULF 17.5-3.13-1.6 GM/177ML PO SOLN: 1.0000 | Freq: Once | ORAL | 0 refills | Status: AC

## 2020-06-07 NOTE — Telephone Encounter (Signed)
Rx has been resent in. 

## 2020-06-07 NOTE — Telephone Encounter (Signed)
Received fax from [facility], TCS/EGD/DIL approved. Certification# [id number], valid 06/07/20-12/07/20 for 1 x OP visit.

## 2020-06-07 NOTE — Addendum Note (Signed)
Addended by: Armstead Peaks on: 06/07/2020 10:36 AM   Modules accepted: Orders

## 2020-06-07 NOTE — Telephone Encounter (Signed)
PATIENT NEEDS PREP SENT TO [facility] ON [address], THE OLD BOX SHE STILL HAS EXPIRED 07/2019

## 2020-06-09 ENCOUNTER — Telehealth: Payer: Self-pay

## 2020-06-09 NOTE — Telephone Encounter (Signed)
Pt called and made aware

## 2020-06-09 NOTE — Telephone Encounter (Signed)
-----   Message from Malen Gauze, MD sent at 06/08/2020 11:54 AM EDT ----- Labs normal ----- Message ----- From: Gustavus Messing, LPN Sent: 1/61/0960   3:44 PM EDT To: Malen Gauze, MD  Please review

## 2020-06-14 ENCOUNTER — Telehealth: Payer: Self-pay | Admitting: Urology

## 2020-06-14 NOTE — Telephone Encounter (Signed)
Pt reports rt side flank pain post stent removal  Pt has ultrasound appt on 07/01/2020- pt will call to see if she can get a sooner appt for ultrasound.

## 2020-06-14 NOTE — Telephone Encounter (Signed)
Pt called and requests a nurse return her call regarding issues she is experiencing.

## 2020-06-16 NOTE — Telephone Encounter (Signed)
Received voicemail from [facility] at pre-service center [phone] ext [phone]). [name] said PA only had TCS listed and not EGD. She had spoke to [employer] with approval from [facility] has been sent to scanning but hasn't been scanned into chart at this time.  Called [facility] utilization review [phone]), spoke to [facility], she pulled up certification# [id number]. [id number], [id number], and [id number] are listed and approved. Serena didn't see a note that anyone had called them to check on PA/  Tried to call [name], LMOVM to inform her I spoke to [employer] at [facility] and all 3 cpt codes are listed on authorization. Gave her phone number I called for utilization review at [facility].

## 2020-06-20 NOTE — Patient Instructions (Signed)
[name]  06/20/2020     @PREFPERIOPPHARMACY @   Your procedure is scheduled on  06/23/2020.06/25/2020  Report to [facility] at  [phone]  A.M.  Call this number if you have problems the morning of surgery:  [phone]   Remember:  Follow the diet and prep instructions given to you by Dr [phone] office                        Take these medicines the morning of surgery with A SIP OF WATER  Wellbutrin, cymbalta, abilify, omeprazole.    Do not wear jewelry, make-up or nail polish.  Do not wear lotions, powders, or perfumes. Please wear deodorant and brush your teeth.  Do not shave 48 hours prior to surgery.  Men may shave face and neck.  Do not bring valuables to the hospital.  [facility] is not responsible for any belongings or valuables.  Contacts, dentures or bridgework may not be worn into surgery.  Leave your suitcase in the car.  After surgery it may be brought to your room.  For patients admitted to the hospital, discharge time will be determined by your treatment team.  Patients discharged the day of surgery will not be allowed to drive home.   Name and phone number of your driver:   family Special instructions:  DO NOT smoke the morning of your procedure.  Please read over the following fact sheets that you were given. Anesthesia Post-op Instructions and Care and Recovery After Surgery       Upper Endoscopy, Adult, Care After This sheet gives you information about how to care for yourself after your procedure. Your health care provider may also give you more specific instructions. If you have problems or questions, contact your health care provider. What can I expect after the procedure? After the procedure, it is common to have:  A sore throat.  Mild stomach pain or discomfort.  Bloating.  Nausea. Follow these instructions at home:   Follow instructions from your health care provider about what to eat or drink after your procedure.  Return to  your normal activities as told by your health care provider. Ask your health care provider what activities are safe for you.  Take over-the-counter and prescription medicines only as told by your health care provider.  Do not drive for 24 hours if you were given a sedative during your procedure.  Keep all follow-up visits as told by your health care provider. This is important. Contact a health care provider if you have:  A sore throat that lasts longer than one day.  Trouble swallowing. Get help right away if:  You vomit blood or your vomit looks like coffee grounds.  You have: ? A fever. ? Bloody, black, or tarry stools. ? A severe sore throat or you cannot swallow. ? Difficulty breathing. ? Severe pain in your chest or abdomen. Summary  After the procedure, it is common to have a sore throat, mild stomach discomfort, bloating, and nausea.  Do not drive for 24 hours if you were given a sedative during the procedure.  Follow instructions from your health care provider about what to eat or drink after your procedure.  Return to your normal activities as told by your health care provider. This information is not intended to replace advice given to you by your health care provider. Make sure you discuss any questions you have  with your health care provider. Document Revised: 05/20/2018 Document Reviewed: 04/28/2018 Elsevier Patient Education  2020 [employer]  Esophageal Dilatation Esophageal dilatation, also called esophageal dilation, is a procedure to widen or open (dilate) a blocked or narrowed part of the esophagus. The esophagus is the part of the body that moves food and liquid from the mouth to the stomach. You may need this procedure if:  You have a buildup of scar tissue in your esophagus that makes it difficult, painful, or impossible to swallow. This can be caused by gastroesophageal reflux disease (GERD).  You have cancer of the esophagus.  There is a problem  with how food moves through your esophagus. In some cases, you may need this procedure repeated at a later time to dilate the esophagus gradually. Tell a health care provider about:  Any allergies you have.  All medicines you are taking, including vitamins, herbs, eye drops, creams, and over-the-counter medicines.  Any problems you or family members have had with anesthetic medicines.  Any blood disorders you have.  Any surgeries you have had.  Any medical conditions you have.  Any antibiotic medicines you are required to take before dental procedures.  Whether you are pregnant or may be pregnant. What are the risks? Generally, this is a safe procedure. However, problems may occur, including:  Bleeding due to a tear in the lining of the esophagus.  A hole (perforation) in the esophagus. What happens before the procedure?  Follow instructions from your health care provider about eating or drinking restrictions.  Ask your health care provider about changing or stopping your regular medicines. This is especially important if you are taking diabetes medicines or blood thinners.  Plan to have someone take you home from the hospital or clinic.  Plan to have a responsible adult care for you for at least 24 hours after you leave the hospital or clinic. This is important. What happens during the procedure?  You may be given a medicine to help you relax (sedative).  A numbing medicine may be sprayed into the back of your throat, or you may gargle the medicine.  Your health care provider may perform the dilatation using various surgical instruments, such as: ? Simple dilators. This instrument is carefully placed in the esophagus to stretch it. ? Guided wire bougies. This involves using an endoscope to insert a wire into the esophagus. A dilator is passed over this wire to enlarge the esophagus. Then the wire is removed. ? Balloon dilators. An endoscope with a small balloon at the end  is inserted into the esophagus. The balloon is inflated to stretch the esophagus and open it up. The procedure may vary among health care providers and hospitals. What happens after the procedure?  Your blood pressure, heart rate, breathing rate, and blood oxygen level will be monitored until the medicines you were given have worn off.  Your throat may feel slightly sore and numb. This will improve slowly over time.  You will not be allowed to eat or drink until your throat is no longer numb.  When you are able to drink, urinate, and sit on the edge of the bed without nausea or dizziness, you may be able to return home. Follow these instructions at home:  Take over-the-counter and prescription medicines only as told by your health care provider.  Do not drive for 24 hours if you were given a sedative during your procedure.  You should have a responsible adult with you for 24 hours  after the procedure.  Follow instructions from your health care provider about any eating or drinking restrictions.  Do not use any products that contain nicotine or tobacco, such as cigarettes and e-cigarettes. If you need help quitting, ask your health care provider.  Keep all follow-up visits as told by your health care provider. This is important. Get help right away if you:  Have a fever.  Have chest pain.  Have pain that is not relieved by medication.  Have trouble breathing.  Have trouble swallowing.  Vomit blood. Summary  Esophageal dilatation, also called esophageal dilation, is a procedure to widen or open (dilate) a blocked or narrowed part of the esophagus.  Plan to have someone take you home from the hospital or clinic.  For this procedure, a numbing medicine may be sprayed into the back of your throat, or you may gargle the medicine.  Do not drive for 24 hours if you were given a sedative during your procedure. This information is not intended to replace advice given to you by  your health care provider. Make sure you discuss any questions you have with your health care provider. Document Revised: 09/23/2019 Document Reviewed: 10/01/2017 Elsevier Patient Education  2020 [employer].  Colonoscopy, Adult, Care After This sheet gives you information about how to care for yourself after your procedure. Your health care provider may also give you more specific instructions. If you have problems or questions, contact your health care provider. What can I expect after the procedure? After the procedure, it is common to have:  A small amount of blood in your stool for 24 hours after the procedure.  Some gas.  Mild cramping or bloating of your abdomen. Follow these instructions at home: Eating and drinking   Drink enough fluid to keep your urine pale yellow.  Follow instructions from your health care provider about eating or drinking restrictions.  Resume your normal diet as instructed by your health care provider. Avoid heavy or fried foods that are hard to digest. Activity  Rest as told by your health care provider.  Avoid sitting for a long time without moving. Get up to take short walks every 1-2 hours. This is important to improve blood flow and breathing. Ask for help if you feel weak or unsteady.  Return to your normal activities as told by your health care provider. Ask your health care provider what activities are safe for you. Managing cramping and bloating   Try walking around when you have cramps or feel bloated.  Apply heat to your abdomen as told by your health care provider. Use the heat source that your health care provider recommends, such as a moist heat pack or a heating pad. ? Place a towel between your skin and the heat source. ? Leave the heat on for 20-30 minutes. ? Remove the heat if your skin turns bright red. This is especially important if you are unable to feel pain, heat, or cold. You may have a greater risk of getting  burned. General instructions  For the first 24 hours after the procedure: ? Do not drive or use machinery. ? Do not sign important documents. ? Do not drink alcohol. ? Do your regular daily activities at a slower pace than normal. ? Eat soft foods that are easy to digest.  Take over-the-counter and prescription medicines only as told by your health care provider.  Keep all follow-up visits as told by your health care provider. This is important. Contact a health  care provider if:  You have blood in your stool 2-3 days after the procedure. Get help right away if you have:  More than a small spotting of blood in your stool.  Large blood clots in your stool.  Swelling of your abdomen.  Nausea or vomiting.  A fever.  Increasing pain in your abdomen that is not relieved with medicine. Summary  After the procedure, it is common to have a small amount of blood in your stool. You may also have mild cramping and bloating of your abdomen.  For the first 24 hours after the procedure, do not drive or use machinery, sign important documents, or drink alcohol.  Get help right away if you have a lot of blood in your stool, nausea or vomiting, a fever, or increased pain in your abdomen. This information is not intended to replace advice given to you by your health care provider. Make sure you discuss any questions you have with your health care provider. Document Revised: 06/22/2019 Document Reviewed: 06/22/2019 Elsevier Patient Education  2020 [employer] Monitored Anesthesia Care, Care After These instructions provide you with information about caring for yourself after your procedure. Your health care provider may also give you more specific instructions. Your treatment has been planned according to current medical practices, but problems sometimes occur. Call your health care provider if you have any problems or questions after your procedure. What can I expect after the  procedure? After your procedure, you may:  Feel sleepy for several hours.  Feel clumsy and have poor balance for several hours.  Feel forgetful about what happened after the procedure.  Have poor judgment for several hours.  Feel nauseous or vomit.  Have a sore throat if you had a breathing tube during the procedure. Follow these instructions at home: For at least 24 hours after the procedure:      Have a responsible adult stay with you. It is important to have someone help care for you until you are awake and alert.  Rest as needed.  Do not: ? Participate in activities in which you could fall or become injured. ? Drive. ? Use heavy machinery. ? Drink alcohol. ? Take sleeping pills or medicines that cause drowsiness. ? Make important decisions or sign legal documents. ? Take care of children on your own. Eating and drinking  Follow the diet that is recommended by your health care provider.  If you vomit, drink water, juice, or soup when you can drink without vomiting.  Make sure you have little or no nausea before eating solid foods. General instructions  Take over-the-counter and prescription medicines only as told by your health care provider.  If you have sleep apnea, surgery and certain medicines can increase your risk for breathing problems. Follow instructions from your health care provider about wearing your sleep device: ? Anytime you are sleeping, including during daytime naps. ? While taking prescription pain medicines, sleeping medicines, or medicines that make you drowsy.  If you smoke, do not smoke without supervision.  Keep all follow-up visits as told by your health care provider. This is important. Contact a health care provider if:  You keep feeling nauseous or you keep vomiting.  You feel light-headed.  You develop a rash.  You have a fever. Get help right away if:  You have trouble breathing. Summary  For several hours after your  procedure, you may feel sleepy and have poor judgment.  Have a responsible adult stay with you for at least  24 hours or until you are awake and alert. This information is not intended to replace advice given to you by your health care provider. Make sure you discuss any questions you have with your health care provider. Document Revised: 02/24/2018 Document Reviewed: 03/18/2016 Elsevier Patient Education  [place].

## 2020-06-21 ENCOUNTER — Other Ambulatory Visit (HOSPITAL_COMMUNITY)
Admission: RE | Admit: 2020-06-21 | Discharge: 2020-06-21 | Disposition: A | Discharge status: Home / Self Care | Payer: PRIVATE HEALTH INSURANCE | Source: Ambulatory Visit | Attending: Internal Medicine | Admitting: Internal Medicine

## 2020-06-21 ENCOUNTER — Other Ambulatory Visit: Payer: Self-pay

## 2020-06-21 ENCOUNTER — Encounter (HOSPITAL_COMMUNITY)
Admission: RE | Admit: 2020-06-21 | Discharge: 2020-06-21 | Disposition: A | Discharge status: Home / Self Care | Payer: PRIVATE HEALTH INSURANCE | Source: Ambulatory Visit | Attending: Internal Medicine | Admitting: Internal Medicine

## 2020-06-21 DIAGNOSIS — Z20822 Contact with and (suspected) exposure to covid-19: Secondary | ICD-10-CM | POA: Diagnosis not present

## 2020-06-21 DIAGNOSIS — Z01812 Encounter for preprocedural laboratory examination: Secondary | ICD-10-CM | POA: Insufficient documentation

## 2020-06-21 LAB — BASIC METABOLIC PANEL
Anion gap: 12 (ref 5–15)
BUN: 17 mg/dL (ref 6–20)
CO2: 28 mmol/L (ref 22–32)
Calcium: 9 mg/dL (ref 8.9–10.3)
Chloride: 100 mmol/L (ref 98–111)
Creatinine, Ser: 0.83 mg/dL (ref 0.44–1.00)
GFR calc Af Amer: >60 mL/min (ref >60)
GFR calc non Af Amer: >60 mL/min (ref >60)
Glucose, Bld: 89 mg/dL (ref 70–99)
Potassium: 3.6 mmol/L (ref 3.5–5.1)
Sodium: 140 mmol/L (ref 135–145)

## 2020-06-22 LAB — SARS CORONAVIRUS 2 (TAT 6-24 HRS): SARS Coronavirus 2: NEGATIVE

## 2020-06-23 ENCOUNTER — Encounter (HOSPITAL_COMMUNITY)
Admission: RE | Disposition: A | Discharge status: Home / Self Care | Payer: Self-pay | Source: Home / Self Care | Attending: Internal Medicine

## 2020-06-23 ENCOUNTER — Encounter (HOSPITAL_COMMUNITY): Payer: Self-pay

## 2020-06-23 ENCOUNTER — Ambulatory Visit (HOSPITAL_COMMUNITY): Admit: 2020-06-23 | Payer: No Typology Code available for payment source | Admitting: Internal Medicine

## 2020-06-23 ENCOUNTER — Ambulatory Visit (HOSPITAL_COMMUNITY): Payer: PRIVATE HEALTH INSURANCE | Admitting: Anesthesiology

## 2020-06-23 ENCOUNTER — Other Ambulatory Visit: Payer: Self-pay

## 2020-06-23 ENCOUNTER — Ambulatory Visit (HOSPITAL_COMMUNITY)
Admission: RE | Admit: 2020-06-23 | Discharge: 2020-06-23 | Disposition: A | Discharge status: Home / Self Care | Payer: PRIVATE HEALTH INSURANCE | Attending: Internal Medicine | Admitting: Internal Medicine

## 2020-06-23 ENCOUNTER — Encounter (HOSPITAL_COMMUNITY): Payer: Self-pay | Admitting: Internal Medicine

## 2020-06-23 DIAGNOSIS — E1140 Type 2 diabetes mellitus with diabetic neuropathy, unspecified: Secondary | ICD-10-CM | POA: Insufficient documentation

## 2020-06-23 DIAGNOSIS — Z885 Allergy status to narcotic agent status: Secondary | ICD-10-CM | POA: Diagnosis not present

## 2020-06-23 DIAGNOSIS — Z794 Long term (current) use of insulin: Secondary | ICD-10-CM | POA: Insufficient documentation

## 2020-06-23 DIAGNOSIS — Z882 Allergy status to sulfonamides status: Secondary | ICD-10-CM | POA: Diagnosis not present

## 2020-06-23 DIAGNOSIS — K219 Gastro-esophageal reflux disease without esophagitis: Secondary | ICD-10-CM | POA: Diagnosis not present

## 2020-06-23 DIAGNOSIS — G4733 Obstructive sleep apnea (adult) (pediatric): Secondary | ICD-10-CM | POA: Diagnosis not present

## 2020-06-23 DIAGNOSIS — Z87892 Personal history of anaphylaxis: Secondary | ICD-10-CM | POA: Insufficient documentation

## 2020-06-23 DIAGNOSIS — Z881 Allergy status to other antibiotic agents status: Secondary | ICD-10-CM | POA: Diagnosis not present

## 2020-06-23 DIAGNOSIS — Z6839 Body mass index (BMI) 39.0-39.9, adult: Secondary | ICD-10-CM | POA: Insufficient documentation

## 2020-06-23 DIAGNOSIS — E669 Obesity, unspecified: Secondary | ICD-10-CM | POA: Diagnosis not present

## 2020-06-23 DIAGNOSIS — K3189 Other diseases of stomach and duodenum: Secondary | ICD-10-CM | POA: Insufficient documentation

## 2020-06-23 DIAGNOSIS — Z1211 Encounter for screening for malignant neoplasm of colon: Secondary | ICD-10-CM | POA: Diagnosis present

## 2020-06-23 DIAGNOSIS — I10 Essential (primary) hypertension: Secondary | ICD-10-CM | POA: Diagnosis not present

## 2020-06-23 DIAGNOSIS — E7800 Pure hypercholesterolemia, unspecified: Secondary | ICD-10-CM | POA: Insufficient documentation

## 2020-06-23 DIAGNOSIS — R1310 Dysphagia, unspecified: Secondary | ICD-10-CM | POA: Diagnosis not present

## 2020-06-23 DIAGNOSIS — K259 Gastric ulcer, unspecified as acute or chronic, without hemorrhage or perforation: Secondary | ICD-10-CM | POA: Diagnosis not present

## 2020-06-23 DIAGNOSIS — Z880 Allergy status to penicillin: Secondary | ICD-10-CM | POA: Insufficient documentation

## 2020-06-23 DIAGNOSIS — Z833 Family history of diabetes mellitus: Secondary | ICD-10-CM | POA: Diagnosis not present

## 2020-06-23 DIAGNOSIS — Z8249 Family history of ischemic heart disease and other diseases of the circulatory system: Secondary | ICD-10-CM | POA: Insufficient documentation

## 2020-06-23 HISTORY — PX: COLONOSCOPY WITH PROPOFOL: SHX5780

## 2020-06-23 HISTORY — PX: MALONEY DILATION: SHX5535

## 2020-06-23 HISTORY — PX: BIOPSY: SHX5522

## 2020-06-23 HISTORY — PX: ESOPHAGOGASTRODUODENOSCOPY (EGD) WITH PROPOFOL: SHX5813

## 2020-06-23 LAB — GLUCOSE, CAPILLARY
Glucose-Capillary: 105 mg/dL — ABNORMAL HIGH (ref 70–99)
Glucose-Capillary: 89 mg/dL (ref 70–99)

## 2020-06-23 SURGERY — COLONOSCOPY WITH PROPOFOL
Anesthesia: General

## 2020-06-23 SURGERY — COLONOSCOPY WITH PROPOFOL
Anesthesia: Monitor Anesthesia Care

## 2020-06-23 MED ORDER — KETAMINE HCL 50 MG/5ML IJ SOSY
PREFILLED_SYRINGE | INTRAMUSCULAR | Status: AC
  Filled 2020-06-23: qty 5

## 2020-06-23 MED ORDER — LACTATED RINGERS IV SOLN
INTRAVENOUS | Status: DC | PRN
Start: 2020-06-23 — End: 2020-06-23

## 2020-06-23 MED ORDER — CHLORHEXIDINE GLUCONATE CLOTH 2 % EX PADS: 6.0000 | MEDICATED_PAD | Freq: Once | CUTANEOUS | Status: DC

## 2020-06-23 MED ORDER — PROPOFOL 10 MG/ML IV BOLUS
INTRAVENOUS | Status: AC
  Filled 2020-06-23: qty 20

## 2020-06-23 MED ORDER — KETAMINE HCL 10 MG/ML IJ SOLN
INTRAMUSCULAR | Status: DC | PRN
  Administered 2020-06-23: 30 mg via INTRAVENOUS

## 2020-06-23 MED ORDER — LACTATED RINGERS IV SOLN: INTRAVENOUS | Status: DC

## 2020-06-23 MED ORDER — PROPOFOL 10 MG/ML IV BOLUS
INTRAVENOUS | Status: DC | PRN
  Administered 2020-06-23: 50 mg via INTRAVENOUS

## 2020-06-23 MED ORDER — PROPOFOL 500 MG/50ML IV EMUL
INTRAVENOUS | Status: DC | PRN
  Administered 2020-06-23: 150 ug/kg/min via INTRAVENOUS

## 2020-06-23 NOTE — Discharge Instructions (Signed)
Monitored Anesthesia Care, Care After These instructions provide you with information about caring for yourself after your procedure. Your health care provider may also give you more specific instructions. Your treatment has been planned according to current medical practices, but problems sometimes occur. Call your health care provider if you have any problems or questions after your procedure. What can I expect after the procedure? After your procedure, you may:  Feel sleepy for several hours.  Feel clumsy and have poor balance for several hours.  Feel forgetful about what happened after the procedure.  Have poor judgment for several hours.  Feel nauseous or vomit.  Have a sore throat if you had a breathing tube during the procedure. Follow these instructions at home: For at least 24 hours after the procedure:      Have a responsible adult stay with you. It is important to have someone help care for you until you are awake and alert.  Rest as needed.  Do not: ? Participate in activities in which you could fall or become injured. ? Drive. ? Use heavy machinery. ? Drink alcohol. ? Take sleeping pills or medicines that cause drowsiness. ? Make important decisions or sign legal documents. ? Take care of children on your own. Eating and drinking  Follow the diet that is recommended by your health care provider.  If you vomit, drink water, juice, or soup when you can drink without vomiting.  Make sure you have little or no nausea before eating solid foods. General instructions  Take over-the-counter and prescription medicines only as told by your health care provider.  If you have sleep apnea, surgery and certain medicines can increase your risk for breathing problems. Follow instructions from your health care provider about wearing your sleep device: ? Anytime you are sleeping, including during daytime naps. ? While taking prescription pain medicines, sleeping  medicines, or medicines that make you drowsy.  If you smoke, do not smoke without supervision.  Keep all follow-up visits as told by your health care provider. This is important. Contact a health care provider if:  You keep feeling nauseous or you keep vomiting.  You feel light-headed.  You develop a rash.  You have a fever. Get help right away if:  You have trouble breathing. Summary  For several hours after your procedure, you may feel sleepy and have poor judgment.  Have a responsible adult stay with you for at least 24 hours or until you are awake and alert. This information is not intended to replace advice given to you by your health care provider. Make sure you discuss any questions you have with your health care provider. Document Revised: 02/24/2018 Document Reviewed: 03/18/2016 Elsevier Patient Education  2020 Elsevier Inc.   Gastroesophageal Reflux Disease, Adult Gastroesophageal reflux (GER) happens when acid from the stomach flows up into the tube that connects the mouth and the stomach (esophagus). Normally, food travels down the esophagus and stays in the stomach to be digested. With GER, food and stomach acid sometimes move back up into the esophagus. You may have a disease called gastroesophageal reflux disease (GERD) if the reflux:  Happens often.  Causes frequent or very bad symptoms.  Causes problems such as damage to the esophagus. When this happens, the esophagus becomes sore and swollen (inflamed). Over time, GERD can make small holes (ulcers) in the lining of the esophagus. What are the causes? This condition is caused by a problem with the muscle between the esophagus and the stomach.  When this muscle is weak or not normal, it does not close properly to keep food and acid from coming back up from the stomach. The muscle can be weak because of:  Tobacco use.  Pregnancy.  Having a certain type of hernia (hiatal hernia).  Alcohol use.  Certain  foods and drinks, such as coffee, chocolate, onions, and peppermint. What increases the risk? You are more likely to develop this condition if you:  Are overweight.  Have a disease that affects your connective tissue.  Use NSAID medicines. What are the signs or symptoms? Symptoms of this condition include:  Heartburn.  Difficult or painful swallowing.  The feeling of having a lump in the throat.  A bitter taste in the mouth.  Bad breath.  Having a lot of saliva.  Having an upset or bloated stomach.  Belching.  Chest pain. Different conditions can cause chest pain. Make sure you see your doctor if you have chest pain.  Shortness of breath or noisy breathing (wheezing).  Ongoing (chronic) cough or a cough at night.  Wearing away of the surface of teeth (tooth enamel).  Weight loss. How is this treated? Treatment will depend on how bad your symptoms are. Your doctor may suggest:  Changes to your diet.  Medicine.  Surgery. Follow these instructions at home: Eating and drinking   Follow a diet as told by your doctor. You may need to avoid foods and drinks such as: ? Coffee and tea (with or without caffeine). ? Drinks that contain alcohol. ? Energy drinks and sports drinks. ? Bubbly (carbonated) drinks or sodas. ? Chocolate and cocoa. ? Peppermint and mint flavorings. ? Garlic and onions. ? Horseradish. ? Spicy and acidic foods. These include peppers, chili powder, curry powder, vinegar, hot sauces, and BBQ sauce. ? Citrus fruit juices and citrus fruits, such as oranges, lemons, and limes. ? Tomato-based foods. These include red sauce, chili, salsa, and pizza with red sauce. ? Fried and fatty foods. These include donuts, french fries, potato chips, and high-fat dressings. ? High-fat meats. These include hot dogs, rib eye steak, sausage, ham, and bacon. ? High-fat dairy items, such as whole milk, butter, and cream cheese.  Eat small meals often. Avoid eating  large meals.  Avoid drinking large amounts of liquid with your meals.  Avoid eating meals during the 2-3 hours before bedtime.  Avoid lying down right after you eat.  Do not exercise right after you eat. Lifestyle   Do not use any products that contain nicotine or tobacco. These include cigarettes, e-cigarettes, and chewing tobacco. If you need help quitting, ask your doctor.  Try to lower your stress. If you need help doing this, ask your doctor.  If you are overweight, lose an amount of weight that is healthy for you. Ask your doctor about a safe weight loss goal. General instructions  Pay attention to any changes in your symptoms.  Take over-the-counter and prescription medicines only as told by your doctor. Do not take aspirin, ibuprofen, or other NSAIDs unless your doctor says it is okay.  Wear loose clothes. Do not wear anything tight around your waist.  Raise (elevate) the head of your bed about 6 inches (15 cm).  Avoid bending over if this makes your symptoms worse.  Keep all follow-up visits as told by your doctor. This is important. Contact a doctor if:  You have new symptoms.  You lose weight and you do not know why.  You have trouble swallowing or it  hurts to swallow.  You have wheezing or a cough that keeps happening.  Your symptoms do not get better with treatment.  You have a hoarse voice. Get help right away if:  You have pain in your arms, neck, jaw, teeth, or back.  You feel sweaty, dizzy, or light-headed.  You have chest pain or shortness of breath.  You throw up (vomit) and your throw-up looks like blood or coffee grounds.  You pass out (faint).  Your poop (stool) is bloody or black.  You cannot swallow, drink, or eat. Summary  If a person has gastroesophageal reflux disease (GERD), food and stomach acid move back up into the esophagus and cause symptoms or problems such as damage to the esophagus.  Treatment will depend on how bad  your symptoms are.  Follow a diet as told by your doctor.  Take all medicines only as told by your doctor. This information is not intended to replace advice given to you by your health care provider. Make sure you discuss any questions you have with your health care provider. Document Revised: 06/04/2018 Document Reviewed: 06/04/2018 Elsevier Patient Education  2020 Elsevier Inc.   Colonoscopy Discharge Instructions  Read the instructions outlined below and refer to this sheet in the next few weeks. These discharge instructions provide you with general information on caring for yourself after you leave the hospital. Your doctor may also give you specific instructions. While your treatment has been planned according to the most current medical practices available, unavoidable complications occasionally occur. If you have any problems or questions after discharge, call Dr. Jena Gauss at [phone]. ACTIVITY  You may resume your regular activity, but move at a slower pace for the next 24 hours.   Take frequent rest periods for the next 24 hours.   Walking will help get rid of the air and reduce the bloated feeling in your belly (abdomen).   No driving for 24 hours (because of the medicine (anesthesia) used during the test).    Do not sign any important legal documents or operate any machinery for 24 hours (because of the anesthesia used during the test).  NUTRITION  Drink plenty of fluids.   You may resume your normal diet as instructed by your doctor.   Begin with a light meal and progress to your normal diet. Heavy or fried foods are harder to digest and may make you feel sick to your stomach (nauseated).   Avoid alcoholic beverages for 24 hours or as instructed.  MEDICATIONS  You may resume your normal medications unless your doctor tells you otherwise.  WHAT YOU CAN EXPECT TODAY  Some feelings of bloating in the abdomen.   Passage of more gas than usual.   Spotting of blood in  your stool or on the toilet paper.  IF YOU HAD POLYPS REMOVED DURING THE COLONOSCOPY:  No aspirin products for 7 days or as instructed.   No alcohol for 7 days or as instructed.   Eat a soft diet for the next 24 hours.  FINDING OUT THE RESULTS OF YOUR TEST Not all test results are available during your visit. If your test results are not back during the visit, make an appointment with your caregiver to find out the results. Do not assume everything is normal if you have not heard from your caregiver or the medical facility. It is important for you to follow up on all of your test results.  SEEK IMMEDIATE MEDICAL ATTENTION IF:  You have more than  a spotting of blood in your stool.   Your belly is swollen (abdominal distention).   You are nauseated or vomiting.   You have a temperature over 101.   You have abdominal pain or discomfort that is severe or gets worse throughout the day.   Your colonoscopy prep was grossly inadequate.  I was not able to see very well.  I recommend you have another colonoscopy for screening within the next year.  Your stomach was inflamed it was biopsied.  Your esophagus was stretched today  Stop omeprazole/Prilosec; begin Protonix 40 mg once daily  GERD information provided  Office visit with [place] in 3 months  At patient request, I called Eyana Stolze at [phone] -reviewed results

## 2020-06-23 NOTE — Progress Notes (Signed)
Patients personal insulin pump still in place, patient given diet soda, graham crackers and peanut butter. Patient turned insulin pump back on.

## 2020-06-23 NOTE — H&P (Signed)
@LOGO @   Primary Care Physician:  , MD Primary Gastroenterologist:  Dr. Benita Stabile  Pre-Procedure History & Physical: HPI:  [name] is a 54 y.o. female here for here for first ever average rescreening colonoscopy and further evaluation of dysphagia via EGD with esophageal dilation as feasible/appropriate.  Omeprazole not controlling GERD very well.  Past Medical History:  Diagnosis Date  . Diabetes mellitus    insulin pump 2013  . Hypercholesteremia   . Hypertension   . Kidney stone   . Neuropathy   . Obesity   . Obstructive sleep apnea   . Palpitations   . Shortness of breath     Past Surgical History:  Procedure Laterality Date  . CARDIAC CATHETERIZATION  12/11/2012   normal coronary arteries  . CESAREAN SECTION  1994;2000   [facility]  . CHOLECYSTECTOMY  1992  . CYSTOSCOPY W/ URETERAL STENT PLACEMENT  05/08/2012   Procedure: CYSTOSCOPY WITH RETROGRADE PYELOGRAM/URETERAL STENT PLACEMENT;  Surgeon: 05/10/2012, MD;  Location: [facility];  Service: Urology;  Laterality: Right;  . CYSTOSCOPY WITH RETROGRADE PYELOGRAM, URETEROSCOPY AND STENT PLACEMENT Right 05/04/2020   Procedure: CYSTOSCOPY WITH RIGHT RETROGRADE PYELOGRAM, RIGHT URETEROSCOPY AND RIGHT URETERAL STENT EXCHANGE;  Surgeon: 05/06/2020, MD;  Location: [facility];  Service: Urology;  Laterality: Right;  . CYSTOSCOPY WITH STENT PLACEMENT Right 02/25/2020   Procedure: CYSTOSCOPY WITH RIGHT RETROGRADE RIGHT STENT PLACEMENT;  Surgeon: 02/27/2020, MD;  Location: [facility];  Service: Urology;  Laterality: Right;  . FOOT SURGERY Right March, 2013   [facility]-removal of bone spur  . HOLMIUM LASER APPLICATION  05/04/2020   Procedure: HOLMIUM LASER LITHOTRIPSY RIGHT URETERAL CALCULUS;  Surgeon: 05/06/2020, MD;  Location: [facility];  Service: Urology;;  . HYSTEROSCOPY WITH THERMACHOICE  04/25/2012   Procedure: HYSTEROSCOPY WITH THERMACHOICE;  Surgeon: 04/27/2012, MD;  Location: [facility];   Service: Gynecology;  Laterality: N/A;  total therapy time= 11 minutes 42 seconds; 34 ml D5w  in and 34 ml D5w out; temp =87 degrees F  . LEFT HEART CATHETERIZATION WITH CORONARY ANGIOGRAM N/A 12/11/2012   Procedure: LEFT HEART CATHETERIZATION WITH CORONARY ANGIOGRAM;  Surgeon: 02/08/2013, MD;  Location: [facility] CATH LAB;  Service: Cardiovascular;  Laterality: N/A;  . Sinus sergery  1988   [place]  . UMBILICAL HERNIA REPAIR N/A 03/25/2014   Procedure: UMBILICAL HERNIA REPAIR;  Surgeon: 03/27/2014, MD;  Location: [facility];  Service: General;  Laterality: N/A;  . uterine ablation      Prior to Admission medications   Medication Sig Start Date End Date Taking? Authorizing Provider  ALPRAZolam Marlane Hatcher) 1 MG tablet Take 1 mg by mouth at bedtime.  02/04/18  Yes [provider]  ARIPiprazole (ABILIFY) 2 MG tablet Take 2 mg by mouth at bedtime.  04/13/19  Yes [provider]  buPROPion (WELLBUTRIN XL) 150 MG 24 hr tablet Take 150 mg by mouth daily.  12/29/17  Yes [provider]  DULoxetine (CYMBALTA) 60 MG capsule Take 60 mg by mouth 2 (two) times daily.  02/17/18  Yes [provider]  furosemide (LASIX) 20 MG tablet Take 40 mg by mouth daily. 12/08/19  Yes [provider]  gabapentin (NEURONTIN) 600 MG tablet Take 600 mg by mouth 3 (three) times daily.    Yes [provider]  HUMALOG 100 UNIT/ML injection Inject into the skin continuous. 3.5 unirs every 1 hr-insulin pump 01/06/15  Yes [provider]  HYDROcodone-acetaminophen (NORCO/VICODIN)  5-325 MG tablet Take 1-2 tablets by mouth every 4 (four) hours as needed. Patient taking differently: Take 1-2 tablets by mouth every 4 (four) hours as needed for moderate pain or severe pain.  05/04/20  Yes McKenzie, Mardene Celeste, MD  LORazepam (ATIVAN) 0.5 MG tablet Take 0.5 mg by mouth at bedtime as needed for anxiety.  01/29/18  Yes [provider]  Multiple Vitamin (MULTIVITAMIN WITH MINERALS)  TABS tablet Take 1 tablet by mouth daily.   Yes [provider]  omeprazole (PRILOSEC) 40 MG capsule Take 40 mg by mouth daily. 02/24/20  Yes [provider]  oxybutynin (DITROPAN-XL) 5 MG 24 hr tablet Take 5 mg by mouth daily. 05/05/20  Yes [provider]  phentermine 37.5 MG capsule Take 37.5 mg by mouth daily as needed (Weight).  05/06/20  Yes [provider]  Potassium 99 MG TABS Take 99 mg by mouth daily.   Yes [provider]  simvastatin (ZOCOR) 40 MG tablet Take 40 mg by mouth daily.  03/29/19  Yes [provider]  albuterol (VENTOLIN HFA) 108 (90 Base) MCG/ACT inhaler Inhale 2 puffs into the lungs every 6 (six) hours as needed for wheezing or shortness of breath.  08/27/19   [provider]    Allergies as of 03/10/2020 - Review Complete 03/09/2020  Allergen Reaction Noted  . Ciprofloxacin Hives and Swelling 04/25/2012  . Penicillins Anaphylaxis and Rash 08/31/2008  . Codeine Other (See Comments) 08/31/2008  . Morphine Nausea And Vomiting and Other (See Comments) 08/31/2008  . Sulfonamide derivatives Other (See Comments) 08/31/2008  . Vancomycin Itching 02/19/2014    Family History  Problem Relation Age of Onset  . Diabetes Mother   . Depression Paternal Grandmother   . Depression Paternal Grandfather   . Heart disease Other   . Cancer Other   . Diabetes Other   . Achalasia Other   . Anesthesia problems Neg Hx   . Hypotension Neg Hx   . Malignant hyperthermia Neg Hx   . Pseudochol deficiency Neg Hx     Social History   Socioeconomic History  . Marital status: Married    Spouse name: Not on file  . Number of children: 2  . Years of education: college  . Highest education level: Not on file  Occupational History  . Occupation: CNA    Employer: Audiological scientist: [employer]  Tobacco Use  . Smoking status: Never Smoker  . Smokeless tobacco: Never Used  Substance and Sexual Activity  . Alcohol  use: No  . Drug use: No  . Sexual activity: Yes    Partners: Male    Comment: ablation  Other Topics Concern  . Not on file  Social History Narrative   Regular exercise: walks    Caffeine use: not regularly   Employed and full time student at [employer]   Social Determinants of Health   Financial Resource Strain:   . Difficulty of Paying Living Expenses:   Food Insecurity:   . Worried About Programme researcher, broadcasting/film/video in the Last Year:   . Barista in the Last Year:   Transportation Needs:   . Freight forwarder (Medical):   Marland Kitchen Lack of Transportation (Non-Medical):   Physical Activity:   . Days of Exercise per Week:   . Minutes of Exercise per Session:   Stress:   . Feeling of Stress :   Social Connections:   . Frequency of Communication with Friends  and Family:   . Frequency of Social Gatherings with Friends and Family:   . Attends Religious Services:   . Active Member of Clubs or Organizations:   . Attends Banker Meetings:   Marland Kitchen Marital Status:   Intimate Partner Violence:   . Fear of Current or Ex-Partner:   . Emotionally Abused:   Marland Kitchen Physically Abused:   . Sexually Abused:     Review of Systems: See HPI, otherwise negative ROS  Physical Exam: BP (!) 141/77   Pulse 75   Temp 97.6 F (36.4 C) (Oral)   Resp 18   Ht 5' (1.524 m)   Wt 91.6 kg   LMP 07/16/2012   SpO2 97%   BMI 39.45 kg/m  General:   Alert,  Well-developed, well-nourished, pleasant and cooperative in NAD Neck:  Supple; no masses or thyromegaly. No significant cervical adenopathy. Lungs:  Clear throughout to auscultation.   No wheezes, crackles, or rhonchi. No acute distress. Heart:  Regular rate and rhythm; no murmurs, clicks, rubs,  or gallops. Abdomen: Non-distended, normal bowel sounds.  Soft and nontender without appreciable mass or hepatosplenomegaly.  Pulses:  Normal pulses noted. Extremities:  Without clubbing or edema.  Impression/Plan: 54 year old lady for here first ever  average her screening colonoscopy and further evaluation of esophageal dysphagia.  I have offered the patient both an EGD and colonoscopy today per plan.  The risks, benefits, limitations, imponderables and alternatives regarding both EGD and colonoscopy have been reviewed with the patient. Questions have been answered. All parties agreeable.      Notice: This dictation was prepared with Dragon dictation along with smaller phrase technology. Any transcriptional errors that result from this process are unintentional and may not be corrected upon review.

## 2020-06-23 NOTE — Op Note (Signed)
[facility] Patient Name: [name] Procedure Date: 06/23/2020 7:07 AM MRN: [id number] Date of Birth: [birth year] Attending MD: Gennette Pac , MD CSN: [id number] Age: 54 Admit Type: Outpatient Procedure:                Upper GI endoscopy Indications:              Dysphagia Providers:                Gennette Pac, MD, Crystal Page, Burke Keels, Technician Referring MD:              Medicines:                Propofol per Anesthesia Complications:            No immediate complications. Estimated Blood Loss:     Estimated blood loss was minimal. Estimated blood                            loss was minimal. Procedure:                Pre-Anesthesia Assessment:                           - Prior to the procedure, a History and Physical                            was performed, and patient medications and                            allergies were reviewed. The patient's tolerance of                            previous anesthesia was also reviewed. The risks                            and benefits of the procedure and the sedation                            options and risks were discussed with the patient.                            All questions were answered, and informed consent                            was obtained. Prior Anticoagulants: The patient has                            taken no previous anticoagulant or antiplatelet                            agents. ASA Grade Assessment: II - A patient with                            mild  systemic disease. After reviewing the risks                            and benefits, the patient was deemed in                            satisfactory condition to undergo the procedure.                           After obtaining informed consent, the endoscope was                            passed under direct vision. Throughout the                            procedure, the patient's blood pressure, pulse,  and                            oxygen saturations were monitored continuously. The                            [id number]-[id number] ([id number]) scope was introduced through the                            mouth, and advanced to the second part of duodenum.                            The upper GI endoscopy was accomplished without                            difficulty. The patient tolerated the procedure                            well. Scope In: 7:43:33 AM Scope Out: 7:50:00 AM Total Procedure Duration: 0 hours 6 minutes 27 seconds  Findings:      The examined esophagus was normal.      Multiple localized erosions were found in the gastric antrum.      The duodenal bulb and second portion of the duodenum were normal. The       scope was withdrawn. Dilation was performed with a Maloney dilator with       mild resistance at 56 Fr. The dilation site was examined following       endoscope reinsertion and showed no change. Estimated blood loss: none.       Finally, the abnormal stomach was biopsied with a cold forceps for       histology. Estimated blood loss was minimal. Impression:               - Normal esophagus. Dilated.                           - Erosive gastropathy Biopsied.                           - Normal duodenal bulb and second portion of the  duodenum. Moderate Sedation:      Moderate (conscious) sedation was personally administered by an       anesthesia professional. The following parameters were monitored: oxygen       saturation, heart rate, blood pressure, respiratory rate, EKG, adequacy       of pulmonary ventilation, and response to care. Recommendation:           - Patient has a contact number available for                            emergencies. The signs and symptoms of potential                            delayed complications were discussed with the                            patient. Return to normal activities tomorrow.                             Written discharge instructions were provided to the                            patient.                           - Resume previous diet.                           - Continue present medications. Stop omeprazole;                            begin Protonix 40 mg daily.                           - Await pathology results. See colonoscopy report.                            Office visit with [place] in 3 months                           - Return to GI clinic in 3 months. Procedure Code(s):        --- Professional ---                           [phone], Esophagogastroduodenoscopy, flexible,                            transoral; with biopsy, single or multiple                           43450, Dilation of esophagus, by unguided sound or                            bougie, single or multiple passes Diagnosis Code(s):        --- Professional ---  K31.89, Other diseases of stomach and duodenum                           R13.10, Dysphagia, unspecified CPT copyright 2019 American Medical Association. All rights reserved. The codes documented in this report are preliminary and upon coder review may  be revised to meet current compliance requirements. Gerrit Friends. Samnang Shugars, MD Gennette Pac, MD 06/23/2020 8:17:46 AM This report has been signed electronically. Number of Addenda: 0

## 2020-06-23 NOTE — Anesthesia Preprocedure Evaluation (Signed)
Anesthesia Evaluation  Patient identified by MRN, date of birth, ID band Patient awake    Reviewed: Allergy & Precautions, H&P , NPO status , Patient's Chart, lab work & pertinent test results, reviewed documented beta blocker date and time   Airway Mallampati: II  TM Distance: >3 FB Neck ROM: full    Dental no notable dental hx.    Pulmonary shortness of breath,    Pulmonary exam normal breath sounds clear to auscultation       Cardiovascular Exercise Tolerance: Good hypertension, negative cardio ROS   Rhythm:regular Rate:Normal     Neuro/Psych  Headaches, PSYCHIATRIC DISORDERS Depression    GI/Hepatic Neg liver ROS, GERD  Medicated,  Endo/Other  negative endocrine ROSdiabetes  Renal/GU Renal disease  negative genitourinary   Musculoskeletal   Abdominal   Peds  Hematology negative hematology ROS (+)   Anesthesia Other Findings   Reproductive/Obstetrics negative OB ROS                             Anesthesia Physical Anesthesia Plan  ASA: II  Anesthesia Plan: General   Post-op Pain Management:    Induction:   PONV Risk Score and Plan: 3  Airway Management Planned:   Additional Equipment:   Intra-op Plan:   Post-operative Plan:   Informed Consent: I have reviewed the patients History and Physical, chart, labs and discussed the procedure including the risks, benefits and alternatives for the proposed anesthesia with the patient or authorized representative who has indicated his/her understanding and acceptance.     Dental Advisory Given  Plan Discussed with: CRNA  Anesthesia Plan Comments:         Anesthesia Quick Evaluation

## 2020-06-23 NOTE — Anesthesia Postprocedure Evaluation (Signed)
Anesthesia Post Note  Patient: DINIA JOYNT  Procedure(s) Performed: COLONOSCOPY WITH PROPOFOL (N/A ) ESOPHAGOGASTRODUODENOSCOPY (EGD) WITH PROPOFOL (N/A ) MALONEY DILATION (N/A ) BIOPSY  Patient location during evaluation: PACU Anesthesia Type: General Level of consciousness: awake, oriented, awake and alert and patient cooperative Pain management: pain level controlled Vital Signs Assessment: post-procedure vital signs reviewed and stable Respiratory status: spontaneous breathing, respiratory function stable and nonlabored ventilation Cardiovascular status: stable Postop Assessment: no apparent nausea or vomiting Anesthetic complications: no   No complications documented.   Last Vitals:  Vitals:   06/23/20 0642  BP: (!) 141/77  Pulse: 75  Resp: 18  Temp: 36.4 C  SpO2: 97%    Last Pain:  Vitals:   06/23/20 0737  TempSrc:   PainSc: 0-No pain                 Pietra Zuluaga

## 2020-06-23 NOTE — Transfer of Care (Signed)
Immediate Anesthesia Transfer of Care Note  Patient: [name]  Procedure(s) Performed: COLONOSCOPY WITH PROPOFOL (N/A ) ESOPHAGOGASTRODUODENOSCOPY (EGD) WITH PROPOFOL (N/A ) MALONEY DILATION (N/A ) BIOPSY  Patient Location: PACU  Anesthesia Type:General  Level of Consciousness: awake, alert , oriented and patient cooperative  Airway & Oxygen Therapy: Patient Spontanous Breathing  Post-op Assessment: Report given to RN, Post -op Vital signs reviewed and stable and Patient moving all extremities X 4  Post vital signs: Reviewed and stable  Last Vitals:  Vitals Value Taken Time  BP    Temp    Pulse 79 06/23/20 0812  Resp 13 06/23/20 0812  SpO2 95 % 06/23/20 0812  Vitals shown include unvalidated device data.  Last Pain:  Vitals:   06/23/20 0737  TempSrc:   PainSc: 0-No pain      Patients Stated Pain Goal: 6 (06/23/20 3762)  Complications: No complications documented.

## 2020-06-23 NOTE — Op Note (Signed)
[facility] Patient Name: [name] Procedure Date: 06/23/2020 7:52 AM MRN: [id number] Date of Birth: 1966/06/25 Attending MD: Gennette Pac , MD CSN: [id number] Age: 54 Admit Type: Outpatient Procedure:                Colonoscopy Indications:              Screening for colorectal malignant neoplasm Providers:                Gennette Pac, MD, Crystal Page, Burke Keels, Technician Referring MD:              Medicines:                Propofol per Anesthesia Complications:            No immediate complications. Estimated Blood Loss:     Estimated blood loss: none. Procedure:                Pre-Anesthesia Assessment:                           - Prior to the procedure, a History and Physical                            was performed, and patient medications and                            allergies were reviewed. The patient's tolerance of                            previous anesthesia was also reviewed. The risks                            and benefits of the procedure and the sedation                            options and risks were discussed with the patient.                            All questions were answered, and informed consent                            was obtained. Prior Anticoagulants: The patient has                            taken no previous anticoagulant or antiplatelet                            agents. ASA Grade Assessment: II - A patient with                            mild systemic disease. After reviewing the risks  and benefits, the patient was deemed in                            satisfactory condition to undergo the procedure.                           After obtaining informed consent, the colonoscope                            was passed under direct vision. Throughout the                            procedure, the patient's blood pressure, pulse, and                            oxygen  saturations were monitored continuously. The                            CF-[id number] ([id number]) scope was introduced through                            the anus and advanced to the the cecum, identified                            by appendiceal orifice and ileocecal valve. The                            colonoscopy was performed without difficulty. The                            patient tolerated the procedure well. The quality                            of the bowel preparation was inadequate. Scope In: 7:57:26 AM Scope Out: 8:07:06 AM Scope Withdrawal Time: 0 hours 4 minutes 23 seconds  Total Procedure Duration: 0 hours 9 minutes 40 seconds  Findings:      The perianal and digital rectal examinations were normal.      A large amount of stool was found in the entire colon, precluding       visualization. Estimated blood loss: none. Impression:               - Preparation of the colon was inadequate.                           - Stool in the entire examined colon.                           - No specimens collected. Moderate Sedation:      Moderate (conscious) sedation was personally administered by an       anesthesia professional. The following parameters were monitored: oxygen       saturation, heart rate, blood pressure, respiratory rate, EKG, adequacy       of pulmonary ventilation, and response to care. Recommendation:           -  Patient has a contact number available for                            emergencies. The signs and symptoms of potential                            delayed complications were discussed with the                            patient. Return to normal activities tomorrow.                            Written discharge instructions were provided to the                            patient.                           - Advance diet as tolerated.                           - Continue present medications.                           - Repeat colonoscopy in 1 year for screening                             purposes.                           - Return to GI office in 3 months. Procedure Code(s):        --- Professional ---                           [phone], Colonoscopy, flexible; diagnostic, including                            collection of specimen(s) by brushing or washing,                            when performed (separate procedure) Diagnosis Code(s):        --- Professional ---                           Z12.11, Encounter for screening for malignant                            neoplasm of colon CPT copyright 2019 American Medical Association. All rights reserved. The codes documented in this report are preliminary and upon coder review may  be revised to meet current compliance requirements. Gerrit Friends. Yuvan Medinger, MD Gennette Pac, MD 06/23/2020 8:20:46 AM This report has been signed electronically. Number of Addenda: 0

## 2020-06-24 ENCOUNTER — Encounter: Payer: Self-pay | Admitting: Internal Medicine

## 2020-06-24 ENCOUNTER — Other Ambulatory Visit: Payer: Self-pay

## 2020-06-24 LAB — SURGICAL PATHOLOGY

## 2020-06-28 ENCOUNTER — Encounter (HOSPITAL_COMMUNITY): Payer: Self-pay | Admitting: Internal Medicine

## 2020-06-30 ENCOUNTER — Other Ambulatory Visit (HOSPITAL_COMMUNITY): Payer: Self-pay | Admitting: Internal Medicine

## 2020-06-30 ENCOUNTER — Other Ambulatory Visit: Payer: Self-pay

## 2020-06-30 ENCOUNTER — Ambulatory Visit (HOSPITAL_COMMUNITY)
Admission: RE | Admit: 2020-06-30 | Discharge: 2020-06-30 | Disposition: A | Discharge status: Home / Self Care | Payer: PRIVATE HEALTH INSURANCE | Source: Ambulatory Visit | Attending: Urology | Admitting: Urology

## 2020-06-30 DIAGNOSIS — Z1231 Encounter for screening mammogram for malignant neoplasm of breast: Secondary | ICD-10-CM

## 2020-06-30 DIAGNOSIS — N200 Calculus of kidney: Secondary | ICD-10-CM

## 2020-07-01 ENCOUNTER — Ambulatory Visit: Payer: No Typology Code available for payment source | Admitting: Allergy & Immunology

## 2020-07-01 ENCOUNTER — Ambulatory Visit (HOSPITAL_COMMUNITY): Payer: No Typology Code available for payment source

## 2020-07-04 ENCOUNTER — Inpatient Hospital Stay (HOSPITAL_COMMUNITY): Admission: RE | Admit: 2020-07-04 | Payer: No Typology Code available for payment source | Source: Ambulatory Visit

## 2020-07-06 ENCOUNTER — Other Ambulatory Visit: Payer: Self-pay

## 2020-07-06 ENCOUNTER — Ambulatory Visit (INDEPENDENT_AMBULATORY_CARE_PROVIDER_SITE_OTHER): Payer: No Typology Code available for payment source | Admitting: Urology

## 2020-07-06 ENCOUNTER — Encounter: Payer: Self-pay | Admitting: Urology

## 2020-07-06 VITALS — BP 135/75 | HR 92 | Temp 97.8°F | Ht 60.0 in | Wt 202.0 lb

## 2020-07-06 DIAGNOSIS — N200 Calculus of kidney: Secondary | ICD-10-CM | POA: Diagnosis not present

## 2020-07-06 LAB — URINALYSIS, ROUTINE W REFLEX MICROSCOPIC
Bilirubin, UA: NEGATIVE
Glucose, UA: NEGATIVE
Ketones, UA: NEGATIVE
Nitrite, UA: NEGATIVE
Protein,UA: NEGATIVE
Specific Gravity, UA: 1.02 (ref 1.005–1.030)
Urobilinogen, Ur: 0.2 mg/dL (ref 0.2–1.0)
pH, UA: 6 (ref 5.0–7.5)

## 2020-07-06 LAB — MICROSCOPIC EXAMINATION: Epithelial Cells (non renal): >10 /hpf — AB (ref 0–10)

## 2020-07-06 NOTE — Patient Instructions (Signed)

## 2020-07-06 NOTE — Progress Notes (Signed)
Urological Symptom Review  Patient is experiencing the following symptoms: Frequent urination Hard to postpone urination Leakage of urine Stream starts and stops Trouble starting stream Have to strain to urinate   Review of Systems  Gastrointestinal (upper)  : Nausea  Gastrointestinal (lower) : Negative for lower GI symptoms  Constitutional : Fatigue Negative for symptoms  Skin: Negative for skin symptoms  Eyes: Negative for eye symptoms  Ear/Nose/Throat : Negative for Ear/Nose/Throat symptoms  Hematologic/Lymphatic: Negative for Hematologic/Lymphatic symptoms  Cardiovascular : Negative for cardiovascular symptoms  Respiratory : Negative for respiratory symptoms  Endocrine: Negative for endocrine symptoms  Musculoskeletal: Negative for musculoskeletal symptoms  Neurological: Negative for neurological symptoms  Psychologic: Negative for psychiatric symptoms

## 2020-07-06 NOTE — Progress Notes (Signed)
07/06/2020 4:02 PM   [name] 02-Mar-1966 [id number]  Referring provider: Benita Stabile, MD [address] [place],  [place] [address]  Nephrolithiasis  HPI: [name] is a 54yo here for followup for nephrolithiasis. Uric acid, PTH and Ca normal. She did a 24 hour urine but the results are not available. renla US shows 1.5cm left renal calculus   PMH: Past Medical History:  Diagnosis Date  . Diabetes mellitus    insulin pump 2013  . Hypercholesteremia   . Hypertension   . Kidney stone   . Neuropathy   . Obesity   . Obstructive sleep apnea   . Palpitations   . Shortness of breath     Surgical History: Past Surgical History:  Procedure Laterality Date  . BIOPSY  06/23/2020   Procedure: BIOPSY;  Surgeon: Corbin Ade, MD;  [facility];  Service: Endoscopy;;  . CARDIAC CATHETERIZATION  12/11/2012   normal coronary arteries  . CESAREAN SECTION  1994;2000   [facility]  . CHOLECYSTECTOMY  1992  . COLONOSCOPY WITH PROPOFOL N/A 06/23/2020   Procedure: COLONOSCOPY WITH PROPOFOL;  Surgeon: Corbin Ade, MD;  [facility];  Service: Endoscopy;  Laterality: N/A;  7:30am  . CYSTOSCOPY W/ URETERAL STENT PLACEMENT  05/08/2012   Procedure: CYSTOSCOPY WITH RETROGRADE PYELOGRAM/URETERAL STENT PLACEMENT;  Surgeon: Ky Barban, MD;  Location: [facility];  Service: Urology;  Laterality: Right;  . CYSTOSCOPY WITH RETROGRADE PYELOGRAM, URETEROSCOPY AND STENT PLACEMENT Right 05/04/2020   Procedure: CYSTOSCOPY WITH RIGHT RETROGRADE PYELOGRAM, RIGHT URETEROSCOPY AND RIGHT URETERAL STENT EXCHANGE;  Surgeon: Malen Gauze, MD;  Location: [facility];  Service: Urology;  Laterality: Right;  . CYSTOSCOPY WITH STENT PLACEMENT Right 02/25/2020   Procedure: CYSTOSCOPY WITH RIGHT RETROGRADE RIGHT STENT PLACEMENT;  Surgeon: Jerilee Field, MD;  Location: [facility];  Service: Urology;  Laterality: Right;  . ESOPHAGOGASTRODUODENOSCOPY (EGD) WITH PROPOFOL N/A 06/23/2020    Procedure: ESOPHAGOGASTRODUODENOSCOPY (EGD) WITH PROPOFOL;  Surgeon: Corbin Ade, MD;  [facility];  Service: Endoscopy;  Laterality: N/A;  . FOOT SURGERY Right March, 2013   [facility]-removal of bone spur  . HOLMIUM LASER APPLICATION  05/04/2020   Procedure: HOLMIUM LASER LITHOTRIPSY RIGHT URETERAL CALCULUS;  Surgeon: Malen Gauze, MD;  Location: [facility];  Service: Urology;;  . HYSTEROSCOPY WITH THERMACHOICE  04/25/2012   Procedure: HYSTEROSCOPY WITH THERMACHOICE;  Surgeon: Lazaro Arms, MD;  Location: [facility];  Service: Gynecology;  Laterality: N/A;  total therapy time= 11 minutes 42 seconds; 34 ml D5w  in and 34 ml D5w out; temp =87 degrees F  . LEFT HEART CATHETERIZATION WITH CORONARY ANGIOGRAM N/A 12/11/2012   Procedure: LEFT HEART CATHETERIZATION WITH CORONARY ANGIOGRAM;  Surgeon: Runell Gess, MD;  Location: [facility] LAB;  Service: Cardiovascular;  Laterality: N/A;  . [name] DILATION N/A 06/23/2020   Procedure: [name] DILATION;  Surgeon: Corbin Ade, MD;  [facility];  Service: Endoscopy;  Laterality: N/A;  . Sinus sergery  1988   [place]  . UMBILICAL HERNIA REPAIR N/A 03/25/2014   Procedure: UMBILICAL HERNIA REPAIR;  Surgeon: Marlane Hatcher, MD;  Location: [facility];  Service: General;  Laterality: N/A;  . uterine ablation      Home Medications:  Allergies as of 07/06/2020      Reactions   Ciprofloxacin Hives, Swelling   Penicillins Anaphylaxis, Rash   Codeine Other (See Comments)   REACTION:  had hallicunations   Morphine  Nausea And Vomiting, Other (See Comments)   REACTION:   recieved in ED due to [facility] and had multple doses - gave visual hallucinations and vomitting.   Sulfonamide Derivatives Other (See Comments)   REACTION: Unsure - childhood allergy   Vancomycin Itching      Medication List       Accurate as of July 06, 2020  4:02 PM. If you have any questions, ask your nurse or doctor.        albuterol 108 (90  Base) MCG/ACT inhaler Commonly known as: VENTOLIN HFA Inhale 2 puffs into the lungs every 6 (six) hours as needed for wheezing or shortness of breath.   ALPRAZolam 1 MG tablet Commonly known as: XANAX Take 1 mg by mouth at bedtime.   ARIPiprazole 2 MG tablet Commonly known as: ABILIFY Take 2 mg by mouth at bedtime.   buPROPion 150 MG 24 hr tablet Commonly known as: WELLBUTRIN XL Take 150 mg by mouth daily.   DULoxetine 60 MG capsule Commonly known as: CYMBALTA Take 60 mg by mouth 2 (two) times daily.   furosemide 20 MG tablet Commonly known as: LASIX Take 40 mg by mouth daily.   gabapentin 600 MG tablet Commonly known as: NEURONTIN Take 600 mg by mouth 3 (three) times daily.   HumaLOG 100 UNIT/ML injection Generic drug: insulin lispro Inject into the skin continuous. 3.5 unirs every 1 hr-insulin pump   HYDROcodone-acetaminophen 5-325 MG tablet Commonly known as: NORCO/VICODIN Take 1-2 tablets by mouth every 4 (four) hours as needed. What changed: reasons to take this   LORazepam 0.5 MG tablet Commonly known as: ATIVAN Take 0.5 mg by mouth at bedtime as needed for anxiety.   multivitamin with minerals Tabs tablet Take 1 tablet by mouth daily.   omeprazole 40 MG capsule Commonly known as: PRILOSEC Take 40 mg by mouth daily.   oxybutynin 5 MG 24 hr tablet Commonly known as: DITROPAN-XL Take 5 mg by mouth daily.   phentermine 37.5 MG capsule Take 37.5 mg by mouth daily as needed (Weight).   Potassium 99 MG Tabs Take 99 mg by mouth daily.   simvastatin 40 MG tablet Commonly known as: ZOCOR Take 40 mg by mouth daily.       Allergies:  Allergies  Allergen Reactions  . Ciprofloxacin Hives and Swelling  . Penicillins Anaphylaxis and Rash  . Codeine Other (See Comments)    REACTION:  had hallicunations  . Morphine Nausea And Vomiting and Other (See Comments)    REACTION:   recieved in ED due to [facility] and had multple doses - gave visual  hallucinations and vomitting.  . Sulfonamide Derivatives Other (See Comments)    REACTION: Unsure - childhood allergy  . Vancomycin Itching    Family History: Family History  Problem Relation Age of Onset  . Diabetes Mother   . Depression Paternal Grandmother   . Depression Paternal Grandfather   . Heart disease Other   . Cancer Other   . Diabetes Other   . Achalasia Other   . Anesthesia problems Neg Hx   . Hypotension Neg Hx   . Malignant hyperthermia Neg Hx   . Pseudochol deficiency Neg Hx     Social History:  reports that she has never smoked. She has never used smokeless tobacco. She reports that she does not drink alcohol and does not use drugs.  ROS: All other review of systems were reviewed and are negative except what is noted above in HPI  Physical Exam:  BP (!) 135/75   Pulse 92   Temp 97.8 F (36.6 C)   Ht 5' (1.524 m)   Wt 202 lb (91.6 kg)   LMP 07/16/2012   BMI 39.45 kg/m   Constitutional:  Alert and oriented, No acute distress. HEENT: [place] AT, moist mucus membranes.  Trachea midline, no masses. Cardiovascular: No clubbing, cyanosis, or edema. Respiratory: Normal respiratory effort, no increased work of breathing. GI: Abdomen is soft, nontender, nondistended, no abdominal masses GU: No CVA tenderness.  Lymph: No cervical or inguinal lymphadenopathy. Skin: No rashes, bruises or suspicious lesions. Neurologic: Grossly intact, no focal deficits, moving all 4 extremities. Psychiatric: Normal mood and affect.  Laboratory Data: Lab Results  Component Value Date   WBC 11.4 (H) 05/02/2020   HGB 12.6 05/04/2020   HCT 37.0 05/04/2020   MCV 86.4 05/02/2020   PLT 384 05/02/2020    Lab Results  Component Value Date   CREATININE 0.83 06/21/2020    No results found for: PSA  No results found for: TESTOSTERONE  Lab Results  Component Value Date   HGBA1C 15.1 (H) 02/25/2020    Urinalysis    Component Value Date/Time   COLORURINE AMBER (A) 04/04/2020  0309   APPEARANCEUR Clear 07/06/2020 1546   LABSPEC 1.020 04/04/2020 0309   PHURINE 6.0 04/04/2020 0309   GLUCOSEU Negative 07/06/2020 1546   HGBUR LARGE (A) 04/04/2020 0309   HGBUR moderate 08/31/2008 0908   BILIRUBINUR Negative 07/06/2020 1546   KETONESUR NEGATIVE 04/04/2020 0309   PROTEINUR Negative 07/06/2020 1546   PROTEINUR 100 (A) 04/04/2020 0309   UROBILINOGEN 0.2 05/25/2020 1555   UROBILINOGEN 0.2 09/21/2014 1055   NITRITE Negative 07/06/2020 1546   NITRITE NEGATIVE 04/04/2020 0309   LEUKOCYTESUR Trace (A) 07/06/2020 1546   LEUKOCYTESUR SMALL (A) 04/04/2020 0309    Lab Results  Component Value Date   LABMICR See below: 07/06/2020   WBCUA 6-10 (A) 07/06/2020   LABEPIT >10 (A) 07/06/2020   BACTERIA Few 07/06/2020    Pertinent Imaging: Renal US 06/30/2020: Images reviewed and discussed with the patient Results for orders placed during the hospital encounter of 06/01/09  DG Abd 1 View  Narrative Clinical Data: Right side pain.  History of urinary tract stones.  ABDOMEN - 1 VIEW  Comparison: Abdomen 06/21/2008.  Findings: As on the prior study, there is a calcification projecting over the right kidney consistent with a 0.9 cm stone. As on the prior study, the stone may be in the right renal pelvis. No new unexpected abdominal calcification is identified.  Bowel gas pattern is unremarkable.  No focal bony abnormality.  IMPRESSION: Unchanged 0.9 cm right renal stone.  Provider: Wilkie Aye  No results found for this or any previous visit.  No results found for this or any previous visit.  No results found for this or any previous visit.  Results for orders placed during the hospital encounter of 06/30/20  Ultrasound renal complete  Narrative CLINICAL DATA:  Follow-up renal calcification extraction.  EXAM: RENAL / URINARY TRACT ULTRASOUND COMPLETE  COMPARISON:  None.  FINDINGS: Right Kidney:  Renal measurements: 12.0 cm x 6.1 cm x 6.6 cm =  volume: 254.58 mL. While the cortex of the right kidney is lobulated and irregular in appearance, the parenchymal echogenicity is within normal limits. No mass or hydronephrosis visualized.  Left Kidney:  Renal measurements: 13.9 cm x 6.8 cm x 6.6 cm = volume: 327.41 mL. While the cortex of the left kidney is lobulated and irregular in appearance,  the parenchymal echogenicity is within normal limits. A 2.8 cm shadowing echogenic focus is seen within the mid to upper left kidney. No mass or hydronephrosis visualized.  Bladder:  Appears normal for degree of bladder distention. The right and left ureteral jets are not clearly identified.  Other:  None.  IMPRESSION: 1. 2.8 cm renal calculus within the mid to upper left kidney. 2. Lobulated and irregular appearing renal cortex, bilaterally, without evidence of renal masses.   Electronically Signed By: Aram Candela M.D. On: 07/01/2020 23:42  No results found for this or any previous visit.  No results found for this or any previous visit.  Results for orders placed during the hospital encounter of 04/04/20  CT RENAL STONE STUDY  Narrative CLINICAL DATA:  Left flank pain and hematuria  EXAM: CT ABDOMEN AND PELVIS WITHOUT CONTRAST  TECHNIQUE: Multidetector CT imaging of the abdomen and pelvis was performed following the standard protocol without IV contrast.  COMPARISON:  February 25, 2020  FINDINGS: Lower chest: The visualized heart size within normal limits. No pericardial fluid/thickening.  No hiatal hernia.  The visualized portions of the lungs are clear.  Hepatobiliary: Although limited due to the lack of intravenous contrast, normal in appearance without gross focal abnormality. The patient is status post cholecystectomy. No biliary ductal dilation.  Pancreas:  Unremarkable.  No surrounding inflammatory changes.  Spleen: Normal in size. Although limited due to the lack of intravenous contrast, normal  in appearance.  Adrenals/Urinary Tract: Both adrenal glands appear normal. There is been interval placement a right-sided double-J ureteral catheter. There is a clustered 1 cm calculus seen within the lower pole of the right kidney. There appears to be mild stranding changes seen around the right ureter extending to the UVJ. There remains mild right ureterectasis present. There is ureterectasis. Again noted are multiple clustered calcifications within the upper pole left kidney extending into the renal pelvis measuring 1.7 cm. There is tiny punctate calcification pole the left kidney measuring 3 mm. No left-sided hydronephrosis is seen.  Stomach/Bowel: The stomach, small bowel, and colon are normal in appearance. No inflammatory changes or obstructive findings. appendix is normal.  Vascular/Lymphatic: There are no enlarged abdominal or pelvic lymph nodes. No significant gross vascular findings are present.  Reproductive: The uterus and adnexa are unremarkable.  Other: No evidence of abdominal wall mass or hernia.  Musculoskeletal: No acute or significant osseous findings.  IMPRESSION: 1. Interval placement right-sided double-J ureteral stent with mild residual ureterectasis. There is also periureteral fat stranding changes seen to the level of the UVJ which could be due to mild ureteritis. 2. Unchanged bilateral renal calculi   Electronically Signed By: Jonna Clark M.D. On: 04/04/2020 02:13   Assessment & Plan:    1. Nephrolithiasis --We discussed the management of kidney stones. These options include observation, ureteroscopy, shockwave lithotripsy (ESWL) and percutaneous nephrolithotomy (PCNL). We discussed which options are relevant to the patient's stone(s). We discussed the natural history of kidney stones as well as the complications of untreated stones and the impact on quality of life without treatment as well as with each of the above listed treatments. We also  discussed the efficacy of each treatment in its ability to clear the stone burden. With any of these management options I discussed the signs and symptoms of infection and the need for emergent treatment should these be experienced. For each option we discussed the ability of each procedure to clear the patient of their stone burden.   For observation  I described the risks which include but are not limited to silent renal damage, life-threatening infection, need for emergent surgery, failure to pass stone and pain.   For ureteroscopy I described the risks which include bleeding, infection, damage to contiguous structures, positioning injury, ureteral stricture, ureteral avulsion, ureteral injury, need for prolonged ureteral stent, inability to perform ureteroscopy, need for an interval procedure, inability to clear stone burden, stent discomfort/pain, heart attack, stroke, pulmonary embolus and the inherent risks with general anesthesia.   For shockwave lithotripsy I described the risks which include arrhythmia, kidney contusion, kidney hemorrhage, need for transfusion, pain, inability to adequately break up stone, inability to pass stone fragments, Steinstrasse, infection associated with obstructing stones, need for alternate surgical procedure, need for repeat shockwave lithotripsy, MI, CVA, PE and the inherent risks with anesthesia/conscious sedation.   For PCNL I described the risks including positioning injury, pneumothorax, hydrothorax, need for chest tube, inability to clear stone burden, renal laceration, arterial venous fistula or malformation, need for embolization of kidney, loss of kidney or renal function, need for repeat procedure, need for prolonged nephrostomy tube, ureteral avulsion, MI, CVA, PE and the inherent risks of general anesthesia.   - The patient would like to proceed with left ureteroscopic stone extraction.   - Urinalysis, Routine w reflex microscopic   No follow-ups on  file.  Wilkie Aye, MD  [facility] [place]

## 2020-07-07 ENCOUNTER — Ambulatory Visit (HOSPITAL_COMMUNITY)
Admission: RE | Admit: 2020-07-07 | Discharge: 2020-07-07 | Disposition: A | Discharge status: Home / Self Care | Payer: No Typology Code available for payment source | Source: Ambulatory Visit | Attending: Internal Medicine | Admitting: Internal Medicine

## 2020-07-07 DIAGNOSIS — Z1231 Encounter for screening mammogram for malignant neoplasm of breast: Secondary | ICD-10-CM | POA: Diagnosis present

## 2020-07-12 ENCOUNTER — Ambulatory Visit: Payer: No Typology Code available for payment source | Admitting: Nurse Practitioner

## 2020-07-13 ENCOUNTER — Ambulatory Visit: Payer: No Typology Code available for payment source | Admitting: Nurse Practitioner

## 2020-07-24 ENCOUNTER — Other Ambulatory Visit: Payer: Self-pay | Admitting: Urology

## 2020-07-29 ENCOUNTER — Telehealth: Payer: Self-pay

## 2020-07-29 NOTE — Telephone Encounter (Signed)
Called pt. Left message of needing new insurance to complete PA for upcoming surgery 08/08/2020

## 2020-08-03 NOTE — Patient Instructions (Signed)
[name]  08/03/2020     @PREFPERIOPPHARMACY @   Your procedure is scheduled on   08/08/2020  Report to [facility] at  0800  A.M.  Call this number if you have problems the morning of surgery:  [phone]   Remember:  Do not eat or drink after midnight.                         Take these medicines the morning of surgery with A SIP OF WATER wellbutrin, cymbalta, gabapentin, prilosec, ditropan. Dial your insulin pump down to basal rate at midnight. Use your inhaler before you come.    Do not wear jewelry, make-up or nail polish.  Do not wear lotions, powders, or perfumes. Please wear deodorant and brush your teeth.  Do not shave 48 hours prior to surgery.  Men may shave face and neck.  Do not bring valuables to the hospital.  [facility] is not responsible for any belongings or valuables.  Contacts, dentures or bridgework may not be worn into surgery.  Leave your suitcase in the car.  After surgery it may be brought to your room.  For patients admitted to the hospital, discharge time will be determined by your treatment team.  Patients discharged the day of surgery will not be allowed to drive home.   Name and phone number of your driver:   family Special instructions:  DO NOT smoke the morning of your procedure.  Please read over the following fact sheets that you were given. Anesthesia Post-op Instructions and Care and Recovery After Surgery       Ureteroscopy Ureteroscopy is a procedure to check for and treat problems inside part of the urinary tract. In this procedure, a thin, tube-shaped instrument with a light at the end (ureteroscope) is used to look at the inside of the kidneys and the ureters, which are the tubes that carry urine from the kidneys to the bladder. The ureteroscope is inserted into one or both of the ureters. You may need this procedure if you have frequent urinary tract infections (UTIs), blood in your urine, or a stone in one of your  ureters. A ureteroscopy can be done to find the cause of urine blockage in a ureter and to evaluate other abnormalities inside the ureters or kidneys. If stones are found, they can be removed during the procedure. Polyps, abnormal tissue, and some types of tumors can also be removed or treated. The ureteroscope may also have a tool to remove tissue to be checked for disease under a microscope (biopsy). Tell a health care provider about:  Any allergies you have.  All medicines you are taking, including vitamins, herbs, eye drops, creams, and over-the-counter medicines.  Any problems you or family members have had with anesthetic medicines.  Any blood disorders you have.  Any surgeries you have had.  Any medical conditions you have.  Whether you are pregnant or may be pregnant. What are the risks? Generally, this is a safe procedure. However, problems may occur, including:  Bleeding.  Infection.  Allergic reactions to medicines.  Scarring that narrows the ureter (stricture).  Creating a hole in the ureter (perforation). What happens before the procedure? Staying hydrated Follow instructions from your health care provider about hydration, which may include:  Up to 2 hours before the procedure - you may continue to drink clear liquids, such as water, clear fruit juice, black coffee, and  plain tea. Eating and drinking restrictions Follow instructions from your health care provider about eating and drinking, which may include:  8 hours before the procedure - stop eating heavy meals or foods such as meat, fried foods, or fatty foods.  6 hours before the procedure - stop eating light meals or foods, such as toast or cereal.  6 hours before the procedure - stop drinking milk or drinks that contain milk.  2 hours before the procedure - stop drinking clear liquids. Medicines  Ask your health care provider about: ? Changing or stopping your regular medicines. This is especially  important if you are taking diabetes medicines or blood thinners. ? Taking medicines such as aspirin and ibuprofen. These medicines can thin your blood. Do not take these medicines before your procedure if your health care provider instructs you not to.  You may be given antibiotic medicine to help prevent infection. General instructions  You may have a urine sample taken to check for infection.  Plan to have someone take you home from the hospital or clinic. What happens during the procedure?   To reduce your risk of infection: ? Your health care team will wash or sanitize their hands. ? Your skin will be washed with soap.  An IV tube will be inserted into one of your veins.  You will be given one of the following: ? A medicine to help you relax (sedative). ? A medicine to make you fall asleep (general anesthetic). ? A medicine that is injected into your spine to numb the area below and slightly above the injection site (spinal anesthetic).  To lower your risk of infection, you may be given an antibiotic medicine by an injection or through the IV tube.  The opening from which you urinate (urethra) will be cleaned with a germ-killing solution.  The ureteroscope will be passed through your urethra into your bladder.  A salt-water solution will flow through the ureteroscope to fill your bladder. This will help the health care provider see the openings of your ureters more clearly.  Then, the ureteroscope will be passed into your ureter. ? If a growth is found, a piece of it may be removed so it can be examined under a microscope (biopsy). ? If a stone is found, it may be removed through the ureteroscope, or the stone may be broken up using a laser, shock waves, or electrical energy. ? In some cases, if the ureter is too small, a tube may be inserted that keeps the ureter open (ureteral stent). The stent may be left in place for 1 or 2 weeks to keep the ureter open, and then the  ureteroscopy procedure will be performed.  The scope will be removed, and your bladder will be emptied. The procedure may vary among health care providers and hospitals. What happens after the procedure?  Your blood pressure, heart rate, breathing rate, and blood oxygen level will be monitored until the medicines you were given have worn off.  You may be asked to urinate.  Donot drive for 24 hours if you were given a sedative. This information is not intended to replace advice given to you by your health care provider. Make sure you discuss any questions you have with your health care provider. Document Revised: 11/08/2017 Document Reviewed: 09/07/2016 Elsevier Patient Education  2020 Elsevier Inc.  Cystoscopy Cystoscopy is a procedure that is used to help diagnose and sometimes treat conditions that affect the lower urinary tract. The lower urinary tract includes  the bladder and the urethra. The urethra is the tube that drains urine from the bladder. Cystoscopy is done using a thin, tube-shaped instrument with a light and camera at the end (cystoscope). The cystoscope may be hard or flexible, depending on the goal of the procedure. The cystoscope is inserted through the urethra, into the bladder. Cystoscopy may be recommended if you have:  Urinary tract infections that keep coming back.  Blood in the urine (hematuria).  An inability to control when you urinate (urinary incontinence) or an overactive bladder.  Unusual cells found in a urine sample.  A blockage in the urethra, such as a urinary stone.  Painful urination.  An abnormality in the bladder found during an intravenous pyelogram (IVP) or CT scan. Cystoscopy may also be done to remove a sample of tissue to be examined under a microscope (biopsy). Tell a health care provider about:  Any allergies you have.  All medicines you are taking, including vitamins, herbs, eye drops, creams, and over-the-counter medicines.  Any  problems you or family members have had with anesthetic medicines.  Any blood disorders you have.  Any surgeries you have had.  Any medical conditions you have.  Whether you are pregnant or may be pregnant. What are the risks? Generally, this is a safe procedure. However, problems may occur, including:  Infection.  Bleeding.  Allergic reactions to medicines.  Damage to other structures or organs. What happens before the procedure?  Ask your health care provider about: ? Changing or stopping your regular medicines. This is especially important if you are taking diabetes medicines or blood thinners. ? Taking medicines such as aspirin and ibuprofen. These medicines can thin your blood. Do not take these medicines unless your health care provider tells you to take them. ? Taking over-the-counter medicines, vitamins, herbs, and supplements.  Follow instructions from your health care provider about eating or drinking restrictions.  Ask your health care provider what steps will be taken to help prevent infection. These may include: ? Washing skin with a germ-killing soap. ? Taking antibiotic medicine.  You may have an exam or testing, such as: ? X-rays of the bladder, urethra, or kidneys. ? Urine tests to check for signs of infection.  Plan to have someone take you home from the hospital or clinic. What happens during the procedure?   You will be given one or more of the following: ? A medicine to help you relax (sedative). ? A medicine to numb the area (local anesthetic).  The area around the opening of your urethra will be cleaned.  The cystoscope will be passed through your urethra into your bladder.  Germ-free (sterile) fluid will flow through the cystoscope to fill your bladder. The fluid will stretch your bladder so that your health care provider can clearly examine your bladder walls.  Your doctor will look at the urethra and bladder. Your doctor may take a biopsy or  remove stones.  The cystoscope will be removed, and your bladder will be emptied. The procedure may vary among health care providers and hospitals. What can I expect after the procedure? After the procedure, it is common to have:  Some soreness or pain in your abdomen and urethra.  Urinary symptoms. These include: ? Mild pain or burning when you urinate. Pain should stop within a few minutes after you urinate. This may last for up to 1 week. ? A small amount of blood in your urine for several days. ? Feeling like you need to  urinate but producing only a small amount of urine. Follow these instructions at home: Medicines  Take over-the-counter and prescription medicines only as told by your health care provider.  If you were prescribed an antibiotic medicine, take it as told by your health care provider. Do not stop taking the antibiotic even if you start to feel better. General instructions  Return to your normal activities as told by your health care provider. Ask your health care provider what activities are safe for you.  Do not drive for 24 hours if you were given a sedative during your procedure.  Watch for any blood in your urine. If the amount of blood in your urine increases, call your health care provider.  Follow instructions from your health care provider about eating or drinking restrictions.  If a tissue sample was removed for testing (biopsy) during your procedure, it is up to you to get your test results. Ask your health care provider, or the department that is doing the test, when your results will be ready.  Drink enough fluid to keep your urine pale yellow.  Keep all follow-up visits as told by your health care provider. This is important. Contact a health care provider if you:  Have pain that gets worse or does not get better with medicine, especially pain when you urinate.  Have trouble urinating.  Have more blood in your urine. Get help right away if  you:  Have blood clots in your urine.  Have abdominal pain.  Have a fever or chills.  Are unable to urinate. Summary  Cystoscopy is a procedure that is used to help diagnose and sometimes treat conditions that affect the lower urinary tract.  Cystoscopy is done using a thin, tube-shaped instrument with a light and camera at the end.  After the procedure, it is common to have some soreness or pain in your abdomen and urethra.  Watch for any blood in your urine. If the amount of blood in your urine increases, call your health care provider.  If you were prescribed an antibiotic medicine, take it as told by your health care provider. Do not stop taking the antibiotic even if you start to feel better. This information is not intended to replace advice given to you by your health care provider. Make sure you discuss any questions you have with your health care provider. Document Revised: 11/18/2018 Document Reviewed: 11/18/2018 Elsevier Patient Education  2020 Elsevier Inc. Monitored Anesthesia Care, Care After These instructions provide you with information about caring for yourself after your procedure. Your health care provider may also give you more specific instructions. Your treatment has been planned according to current medical practices, but problems sometimes occur. Call your health care provider if you have any problems or questions after your procedure. What can I expect after the procedure? After your procedure, you may:  Feel sleepy for several hours.  Feel clumsy and have poor balance for several hours.  Feel forgetful about what happened after the procedure.  Have poor judgment for several hours.  Feel nauseous or vomit.  Have a sore throat if you had a breathing tube during the procedure. Follow these instructions at home: For at least 24 hours after the procedure:      Have a responsible adult stay with you. It is important to have someone help care for you  until you are awake and alert.  Rest as needed.  Do not: ? Participate in activities in which you could fall or become  injured. ? Drive. ? Use heavy machinery. ? Drink alcohol. ? Take sleeping pills or medicines that cause drowsiness. ? Make important decisions or sign legal documents. ? Take care of children on your own. Eating and drinking  Follow the diet that is recommended by your health care provider.  If you vomit, drink water, juice, or soup when you can drink without vomiting.  Make sure you have little or no nausea before eating solid foods. General instructions  Take over-the-counter and prescription medicines only as told by your health care provider.  If you have sleep apnea, surgery and certain medicines can increase your risk for breathing problems. Follow instructions from your health care provider about wearing your sleep device: ? Anytime you are sleeping, including during daytime naps. ? While taking prescription pain medicines, sleeping medicines, or medicines that make you drowsy.  If you smoke, do not smoke without supervision.  Keep all follow-up visits as told by your health care provider. This is important. Contact a health care provider if:  You keep feeling nauseous or you keep vomiting.  You feel light-headed.  You develop a rash.  You have a fever. Get help right away if:  You have trouble breathing. Summary  For several hours after your procedure, you may feel sleepy and have poor judgment.  Have a responsible adult stay with you for at least 24 hours or until you are awake and alert. This information is not intended to replace advice given to you by your health care provider. Make sure you discuss any questions you have with your health care provider. Document Revised: 02/24/2018 Document Reviewed: 03/18/2016 Elsevier Patient Education  2020 [employer].

## 2020-08-05 ENCOUNTER — Encounter (HOSPITAL_COMMUNITY)
Admission: RE | Admit: 2020-08-05 | Discharge: 2020-08-05 | Disposition: A | Discharge status: Home / Self Care | Payer: No Typology Code available for payment source | Source: Ambulatory Visit | Attending: Urology | Admitting: Urology

## 2020-08-05 ENCOUNTER — Other Ambulatory Visit: Payer: Self-pay

## 2020-08-05 ENCOUNTER — Other Ambulatory Visit (HOSPITAL_COMMUNITY)
Admission: RE | Admit: 2020-08-05 | Discharge: 2020-08-05 | Disposition: A | Discharge status: Home / Self Care | Payer: No Typology Code available for payment source | Source: Ambulatory Visit | Attending: Urology | Admitting: Urology

## 2020-08-05 DIAGNOSIS — Z01812 Encounter for preprocedural laboratory examination: Secondary | ICD-10-CM | POA: Insufficient documentation

## 2020-08-05 DIAGNOSIS — Z20822 Contact with and (suspected) exposure to covid-19: Secondary | ICD-10-CM | POA: Diagnosis not present

## 2020-08-05 LAB — HEMOGLOBIN A1C
Hgb A1c MFr Bld: 7.3 % — ABNORMAL HIGH (ref 4.8–5.6)
Mean Plasma Glucose: 162.81 mg/dL

## 2020-08-05 LAB — BASIC METABOLIC PANEL
Anion gap: 13 (ref 5–15)
BUN: 11 mg/dL (ref 6–20)
CO2: 24 mmol/L (ref 22–32)
Calcium: 8.9 mg/dL (ref 8.9–10.3)
Chloride: 104 mmol/L (ref 98–111)
Creatinine, Ser: 0.76 mg/dL (ref 0.44–1.00)
GFR calc Af Amer: >60 mL/min (ref >60)
GFR calc non Af Amer: >60 mL/min (ref >60)
Glucose, Bld: 107 mg/dL — ABNORMAL HIGH (ref 70–99)
Potassium: 3.4 mmol/L — ABNORMAL LOW (ref 3.5–5.1)
Sodium: 141 mmol/L (ref 135–145)

## 2020-08-05 LAB — SARS CORONAVIRUS 2 (TAT 6-24 HRS): SARS Coronavirus 2: NEGATIVE

## 2020-08-08 ENCOUNTER — Ambulatory Visit (HOSPITAL_COMMUNITY): Payer: No Typology Code available for payment source | Admitting: Anesthesiology

## 2020-08-08 ENCOUNTER — Encounter (HOSPITAL_COMMUNITY)
Admission: RE | Disposition: A | Discharge status: Home / Self Care | Payer: Self-pay | Source: Home / Self Care | Attending: Urology

## 2020-08-08 ENCOUNTER — Ambulatory Visit (HOSPITAL_COMMUNITY): Payer: No Typology Code available for payment source

## 2020-08-08 ENCOUNTER — Other Ambulatory Visit: Payer: Self-pay

## 2020-08-08 ENCOUNTER — Encounter (HOSPITAL_COMMUNITY): Payer: Self-pay | Admitting: Urology

## 2020-08-08 ENCOUNTER — Ambulatory Visit (HOSPITAL_COMMUNITY)
Admission: RE | Admit: 2020-08-08 | Discharge: 2020-08-08 | Disposition: A | Discharge status: Home / Self Care | Payer: No Typology Code available for payment source | Attending: Urology | Admitting: Urology

## 2020-08-08 DIAGNOSIS — Z9641 Presence of insulin pump (external) (internal): Secondary | ICD-10-CM | POA: Insufficient documentation

## 2020-08-08 DIAGNOSIS — G4730 Sleep apnea, unspecified: Secondary | ICD-10-CM | POA: Insufficient documentation

## 2020-08-08 DIAGNOSIS — M1990 Unspecified osteoarthritis, unspecified site: Secondary | ICD-10-CM | POA: Insufficient documentation

## 2020-08-08 DIAGNOSIS — G4733 Obstructive sleep apnea (adult) (pediatric): Secondary | ICD-10-CM | POA: Insufficient documentation

## 2020-08-08 DIAGNOSIS — E669 Obesity, unspecified: Secondary | ICD-10-CM | POA: Insufficient documentation

## 2020-08-08 DIAGNOSIS — N200 Calculus of kidney: Secondary | ICD-10-CM | POA: Insufficient documentation

## 2020-08-08 DIAGNOSIS — Z794 Long term (current) use of insulin: Secondary | ICD-10-CM | POA: Diagnosis not present

## 2020-08-08 DIAGNOSIS — E1140 Type 2 diabetes mellitus with diabetic neuropathy, unspecified: Secondary | ICD-10-CM | POA: Insufficient documentation

## 2020-08-08 DIAGNOSIS — I10 Essential (primary) hypertension: Secondary | ICD-10-CM | POA: Diagnosis not present

## 2020-08-08 HISTORY — PX: CYSTOSCOPY WITH RETROGRADE PYELOGRAM, URETEROSCOPY AND STENT PLACEMENT: SHX5789

## 2020-08-08 LAB — GLUCOSE, CAPILLARY
Glucose-Capillary: 237 mg/dL — ABNORMAL HIGH (ref 70–99)
Glucose-Capillary: 186 mg/dL — ABNORMAL HIGH (ref 70–99)
Glucose-Capillary: 152 mg/dL — ABNORMAL HIGH (ref 70–99)

## 2020-08-08 SURGERY — CYSTOURETEROSCOPY, WITH RETROGRADE PYELOGRAM AND STENT INSERTION
Anesthesia: General | Laterality: Left

## 2020-08-08 MED ORDER — DIATRIZOATE MEGLUMINE 30 % UR SOLN
URETHRAL | Status: AC
  Filled 2020-08-08: qty 100

## 2020-08-08 MED ORDER — GENTAMICIN SULFATE 40 MG/ML IJ SOLN
5.0000 mg/kg | INTRAVENOUS | Status: AC
  Administered 2020-08-08: 350 mg via INTRAVENOUS
  Filled 2020-08-08: qty 8.75

## 2020-08-08 MED ORDER — DIATRIZOATE MEGLUMINE 30 % UR SOLN
URETHRAL | Status: DC | PRN
  Administered 2020-08-08: 9 mL via URETHRAL

## 2020-08-08 MED ORDER — LIDOCAINE HCL (CARDIAC) PF 100 MG/5ML IV SOSY
PREFILLED_SYRINGE | INTRAVENOUS | Status: DC | PRN
  Administered 2020-08-08: 80 mg via INTRAVENOUS

## 2020-08-08 MED ORDER — MIDAZOLAM HCL 2 MG/2ML IJ SOLN
INTRAMUSCULAR | Status: AC
  Filled 2020-08-08: qty 2

## 2020-08-08 MED ORDER — WATER FOR IRRIGATION, STERILE IR SOLN
Status: DC | PRN
  Administered 2020-08-08: 500 mL

## 2020-08-08 MED ORDER — PROPOFOL 10 MG/ML IV BOLUS
INTRAVENOUS | Status: DC | PRN
  Administered 2020-08-08: 200 mg via INTRAVENOUS

## 2020-08-08 MED ORDER — FENTANYL CITRATE (PF) 100 MCG/2ML IJ SOLN
25.0000 ug | INTRAMUSCULAR | Status: DC | PRN
  Administered 2020-08-08: 50 ug via INTRAVENOUS
  Filled 2020-08-08: qty 2

## 2020-08-08 MED ORDER — CHLORHEXIDINE GLUCONATE 0.12 % MT SOLN: 15.0000 mL | Freq: Once | OROMUCOSAL | Status: DC

## 2020-08-08 MED ORDER — ORAL CARE MOUTH RINSE: 15.0000 mL | Freq: Once | OROMUCOSAL | Status: DC

## 2020-08-08 MED ORDER — PROPOFOL 10 MG/ML IV BOLUS
INTRAVENOUS | Status: AC
  Filled 2020-08-08: qty 40

## 2020-08-08 MED ORDER — MIDAZOLAM HCL 2 MG/2ML IJ SOLN
INTRAMUSCULAR | Status: DC | PRN
  Administered 2020-08-08: 1 mg via INTRAVENOUS

## 2020-08-08 MED ORDER — ONDANSETRON HCL 4 MG/2ML IJ SOLN
INTRAMUSCULAR | Status: AC
  Filled 2020-08-08: qty 2

## 2020-08-08 MED ORDER — OXYCODONE-ACETAMINOPHEN 5-325 MG PO TABS: 1.0000 | ORAL_TABLET | ORAL | 0 refills | Status: DC | PRN

## 2020-08-08 MED ORDER — FENTANYL CITRATE (PF) 250 MCG/5ML IJ SOLN
INTRAMUSCULAR | Status: AC
  Filled 2020-08-08: qty 5

## 2020-08-08 MED ORDER — PHENYLEPHRINE HCL (PRESSORS) 10 MG/ML IV SOLN
INTRAVENOUS | Status: DC | PRN
  Administered 2020-08-08 (×3): 120 ug via INTRAVENOUS

## 2020-08-08 MED ORDER — GLYCOPYRROLATE 0.2 MG/ML IJ SOLN
INTRAMUSCULAR | Status: DC | PRN
  Administered 2020-08-08: .2 mg via INTRAVENOUS

## 2020-08-08 MED ORDER — LACTATED RINGERS IV SOLN: Freq: Once | INTRAVENOUS | Status: AC

## 2020-08-08 MED ORDER — SODIUM CHLORIDE 0.9 % IR SOLN
Status: DC | PRN
  Administered 2020-08-08 (×2): 3000 mL

## 2020-08-08 MED ORDER — PROMETHAZINE HCL 25 MG/ML IJ SOLN: 6.2500 mg | INTRAMUSCULAR | Status: DC | PRN

## 2020-08-08 MED ORDER — FENTANYL CITRATE (PF) 100 MCG/2ML IJ SOLN
INTRAMUSCULAR | Status: DC | PRN
Start: 2020-08-08 — End: 2020-08-08
  Administered 2020-08-08: 50 ug via INTRAVENOUS
  Administered 2020-08-08: 25 ug via INTRAVENOUS
  Administered 2020-08-08: 50 ug via INTRAVENOUS

## 2020-08-08 MED ORDER — MEPERIDINE HCL 50 MG/ML IJ SOLN: 6.2500 mg | INTRAMUSCULAR | Status: DC | PRN

## 2020-08-08 MED ORDER — ROCURONIUM BROMIDE 100 MG/10ML IV SOLN
INTRAVENOUS | Status: DC | PRN
  Administered 2020-08-08: 20 mg via INTRAVENOUS

## 2020-08-08 MED ORDER — ONDANSETRON HCL 4 MG/2ML IJ SOLN
INTRAMUSCULAR | Status: DC | PRN
  Administered 2020-08-08: 4 mg via INTRAVENOUS

## 2020-08-08 MED ORDER — GLYCOPYRROLATE PF 0.2 MG/ML IJ SOSY
PREFILLED_SYRINGE | INTRAMUSCULAR | Status: AC
  Filled 2020-08-08: qty 1

## 2020-08-08 MED ORDER — SUGAMMADEX SODIUM 200 MG/2ML IV SOLN
INTRAVENOUS | Status: DC | PRN
  Administered 2020-08-08: 200 mg via INTRAVENOUS

## 2020-08-08 MED ORDER — ROCURONIUM BROMIDE 10 MG/ML (PF) SYRINGE
PREFILLED_SYRINGE | INTRAVENOUS | Status: AC
  Filled 2020-08-08: qty 10

## 2020-08-08 MED ORDER — SUCCINYLCHOLINE CHLORIDE 200 MG/10ML IV SOSY
PREFILLED_SYRINGE | INTRAVENOUS | Status: DC | PRN
  Administered 2020-08-08: 140 mg via INTRAVENOUS

## 2020-08-08 SURGICAL SUPPLY — 26 items
BAG DRAIN URO TABLE W/ADPT NS (BAG) ×2 IMPLANT
BAG DRN 8 ADPR NS SKTRN CSTL (BAG) ×1
BAG HAMPER (MISCELLANEOUS) ×2 IMPLANT
CATH INTERMIT  6FR 70CM (CATHETERS) ×2 IMPLANT
CLOTH BEACON ORANGE TIMEOUT ST (SAFETY) ×2 IMPLANT
DECANTER SPIKE VIAL GLASS SM (MISCELLANEOUS) ×2 IMPLANT
EXTRACTOR STONE NITINOL NGAGE (UROLOGICAL SUPPLIES) ×1 IMPLANT
FIBER LASER FLEXIVA 200 (UROLOGICAL SUPPLIES) ×1 IMPLANT
GLOVE BIO SURGEON STRL SZ8 (GLOVE) ×2 IMPLANT
GLOVE BIOGEL PI IND STRL 7.0 (GLOVE) ×3 IMPLANT
GLOVE BIOGEL PI INDICATOR 7.0 (GLOVE) ×3
GOWN STRL REUS W/TWL LRG LVL3 (GOWN DISPOSABLE) ×2 IMPLANT
GOWN STRL REUS W/TWL XL LVL3 (GOWN DISPOSABLE) ×2 IMPLANT
GUIDEWIRE STR DUAL SENSOR (WIRE) ×2 IMPLANT
GUIDEWIRE STR ZIPWIRE 035X150 (MISCELLANEOUS) ×2 IMPLANT
IV NS IRRIG 3000ML ARTHROMATIC (IV SOLUTION) ×4 IMPLANT
KIT TURNOVER CYSTO (KITS) ×2 IMPLANT
MANIFOLD NEPTUNE II (INSTRUMENTS) ×2 IMPLANT
PACK CYSTO (CUSTOM PROCEDURE TRAY) ×2 IMPLANT
PAD ARMBOARD 7.5X6 YLW CONV (MISCELLANEOUS) ×2 IMPLANT
SHEATH URETERAL 12FRX35CM (MISCELLANEOUS) ×1 IMPLANT
STENT URET 6FRX24 CONTOUR (STENTS) ×1 IMPLANT
STENT URET 6FRX26 CONTOUR (STENTS) ×1 IMPLANT
SYR 10ML LL (SYRINGE) ×2 IMPLANT
TOWEL OR 17X26 4PK STRL BLUE (TOWEL DISPOSABLE) ×2 IMPLANT
WATER STERILE IRR 500ML POUR (IV SOLUTION) ×2 IMPLANT

## 2020-08-08 NOTE — H&P (Signed)
Urology Admission H&P  Chief Complaint: left renal calculus  History of Present Illness: [name] is a 53yo with a hx of nephrolithiasis. She has a known 1.5cm left renal calculus. No flank pain currently. No LUTS. No fevers/chills  Past Medical History:  Diagnosis Date   Diabetes mellitus    insulin pump 2013   Hypercholesteremia    Hypertension    Kidney stone    Neuropathy    Obesity    Obstructive sleep apnea    Palpitations    Shortness of breath    Past Surgical History:  Procedure Laterality Date   BIOPSY  06/23/2020   Procedure: BIOPSY;  Surgeon: Corbin Ade, MD;  [facility];  Service: Endoscopy;;   CARDIAC CATHETERIZATION  12/11/2012   normal coronary arteries   CESAREAN SECTION  1994;2000   [facility]   CHOLECYSTECTOMY  1992   COLONOSCOPY WITH PROPOFOL N/A 06/23/2020   Procedure: COLONOSCOPY WITH PROPOFOL;  Surgeon: Corbin Ade, MD;  [facility];  Service: Endoscopy;  Laterality: N/A;  7:30am   CYSTOSCOPY W/ URETERAL STENT PLACEMENT  05/08/2012   Procedure: CYSTOSCOPY WITH RETROGRADE PYELOGRAM/URETERAL STENT PLACEMENT;  Surgeon: Ky Barban, MD;  Location: [facility];  Service: Urology;  Laterality: Right;   CYSTOSCOPY WITH RETROGRADE PYELOGRAM, URETEROSCOPY AND STENT PLACEMENT Right 05/04/2020   Procedure: CYSTOSCOPY WITH RIGHT RETROGRADE PYELOGRAM, RIGHT URETEROSCOPY AND RIGHT URETERAL STENT EXCHANGE;  Surgeon: Malen Gauze, MD;  Location: [facility];  Service: Urology;  Laterality: Right;   CYSTOSCOPY WITH STENT PLACEMENT Right 02/25/2020   Procedure: CYSTOSCOPY WITH RIGHT RETROGRADE RIGHT STENT PLACEMENT;  Surgeon: Jerilee Field, MD;  Location: [facility];  Service: Urology;  Laterality: Right;   ESOPHAGOGASTRODUODENOSCOPY (EGD) WITH PROPOFOL N/A 06/23/2020   Procedure: ESOPHAGOGASTRODUODENOSCOPY (EGD) WITH PROPOFOL;  Surgeon: Corbin Ade, MD;  [facility];  Service: Endoscopy;  Laterality: N/A;   FOOT  SURGERY Right March, 2013   [facility]-removal of bone spur   HOLMIUM LASER APPLICATION  05/04/2020   Procedure: HOLMIUM LASER LITHOTRIPSY RIGHT URETERAL CALCULUS;  Surgeon: Malen Gauze, MD;  Location: [facility];  Service: Urology;;   HYSTEROSCOPY WITH THERMACHOICE  04/25/2012   Procedure: HYSTEROSCOPY WITH THERMACHOICE;  Surgeon: Lazaro Arms, MD;  Location: [facility];  Service: Gynecology;  Laterality: N/A;  total therapy time= 11 minutes 42 seconds; 34 ml D5w  in and 34 ml D5w out; temp =87 degrees F   LEFT HEART CATHETERIZATION WITH CORONARY ANGIOGRAM N/A 12/11/2012   Procedure: LEFT HEART CATHETERIZATION WITH CORONARY ANGIOGRAM;  Surgeon: Runell Gess, MD;  Location: [facility] LAB;  Service: Cardiovascular;  Laterality: N/A;   MALONEY DILATION N/A 06/23/2020   Procedure: [name] DILATION;  Surgeon: Corbin Ade, MD;  [facility];  Service: Endoscopy;  Laterality: N/A;   Sinus sergery  1988   [place]   UMBILICAL HERNIA REPAIR N/A 03/25/2014   Procedure: UMBILICAL HERNIA REPAIR;  Surgeon: Marlane Hatcher, MD;  Location: [facility];  Service: General;  Laterality: N/A;   uterine ablation      Home Medications:  Current Facility-Administered Medications  Medication Dose Route Frequency Provider Last Rate Last Admin   chlorhexidine (PERIDEX) 0.12 % solution 15 mL  15 mL Mouth/Throat Once Battula, Rajamani C, MD       Or   MEDLINE mouth rinse  15 mL Mouth Rinse Once Battula, Rajamani C, MD       gentamicin (GARAMYCIN) 350 mg in dextrose 5 %  100 mL IVPB  5 mg/kg (Adjusted) Intravenous 30 min Pre-Op Cher Franzoni, Mardene Celeste, MD       lactated ringers infusion   Intravenous Once Molli Barrows, MD       Allergies:  Allergies  Allergen Reactions   Ciprofloxacin Hives and Swelling   Penicillins Anaphylaxis and Rash   Codeine Other (See Comments)    had hallicunations   Morphine Nausea And Vomiting and Other (See Comments)    REACTION:   recieved in ED  due to [facility] and had multple doses - gave visual hallucinations and vomitting.   Sulfonamide Derivatives Other (See Comments)    REACTION: Unsure - childhood allergy   Vancomycin Itching    Family History  Problem Relation Age of Onset   Diabetes Mother    Depression Paternal Grandmother    Depression Paternal Grandfather    Heart disease Other    Cancer Other    Diabetes Other    Achalasia Other    Anesthesia problems Neg Hx    Hypotension Neg Hx    Malignant hyperthermia Neg Hx    Pseudochol deficiency Neg Hx    Social History:  reports that she has never smoked. She has never used smokeless tobacco. She reports that she does not drink alcohol and does not use drugs.  Review of Systems  All other systems reviewed and are negative.   Physical Exam:  Vital signs in last 24 hours: Temp:  [98.7 F (37.1 C)] 98.7 F (37.1 C) (08/30 0908) Pulse Rate:  [75] 75 (08/30 0908) Resp:  [18] 18 (08/30 0908) BP: (141)/(78) 141/78 (08/30 0908) SpO2:  [96 %] 96 % (08/30 0908) Weight:  [91.6 kg] 91.6 kg (08/30 0908) Physical Exam Vitals reviewed.  Constitutional:      Appearance: Normal appearance.  HENT:     Head: Normocephalic and atraumatic.     Nose: Nose normal.  Eyes:     Extraocular Movements: Extraocular movements intact.     Pupils: Pupils are equal, round, and reactive to light.  Cardiovascular:     Rate and Rhythm: Normal rate and regular rhythm.  Pulmonary:     Effort: Pulmonary effort is normal. No respiratory distress.  Abdominal:     General: Abdomen is flat. There is no distension.  Musculoskeletal:        General: Normal range of motion.     Cervical back: Normal range of motion and neck supple.  Skin:    General: Skin is warm and dry.  Neurological:     General: No focal deficit present.     Mental Status: She is alert and oriented to person, place, and time.  Psychiatric:        Mood and Affect: Mood normal.        Behavior:  Behavior normal.        Thought Content: Thought content normal.        Judgment: Judgment normal.     Laboratory Data:  Results for orders placed or performed during the hospital encounter of 08/08/20 (from the past 24 hour(s))  Glucose, capillary     Status: Abnormal   Collection Time: 08/08/20  9:04 AM  Result Value Ref Range   Glucose-Capillary 237 (H) 70 - 99 mg/dL   Recent Results (from the past 240 hour(s))  SARS CORONAVIRUS 2 (TAT 6-24 HRS) Nasopharyngeal Nasopharyngeal Swab     Status: None   Collection Time: 08/05/20 12:41 PM   Specimen: Nasopharyngeal Swab  Result Value Ref Range  Status   SARS Coronavirus 2 NEGATIVE NEGATIVE Final    Comment: (NOTE) SARS-CoV-2 target nucleic acids are NOT DETECTED.  The SARS-CoV-2 RNA is generally detectable in upper and lower respiratory specimens during the acute phase of infection. Negative results do not preclude SARS-CoV-2 infection, do not rule out co-infections with other pathogens, and should not be used as the sole basis for treatment or other patient management decisions. Negative results must be combined with clinical observations, patient history, and epidemiological information. The expected result is Negative.  Fact Sheet for Patients: HairSlick.no  Fact Sheet for Healthcare Providers: quierodirigir.com  This test is not yet approved or cleared by the [place] FDA and  has been authorized for detection and/or diagnosis of SARS-CoV-2 by FDA under an Emergency Use Authorization (EUA). This EUA will remain  in effect (meaning this test can be used) for the duration of the COVID-19 declaration under Se ction 564(b)(1) of the Act, 21 U.S.C. section 360bbb-3(b)(1), unless the authorization is terminated or revoked sooner.  Performed at [facility], [address] [place], [place] [address]    Creatinine: Recent Labs    08/05/20 1248  CREATININE  0.76   Baseline Creatinine: 0.76  Impression/Assessment:  53yo with left renal calculu  Plan:  The risks/benefits/alterantives to left ureteroscopic stone extraction was explained to the patient and she understands and wishes to proceed with surgery  Wilkie Aye 08/08/2020, 9:46 AM

## 2020-08-08 NOTE — Op Note (Signed)
.  Preoperative diagnosis: Left renal stone  Postoperative diagnosis: Same  Procedure: 1 cystoscopy 2. Left retrograde pyelography 3.  Intraoperative fluoroscopy, under one hour, with interpretation 4.  Left ureteroscopic stone manipulation with laser lithotripsy 5.  Left 6 x 24 JJ stent placement  Attending: Cleda Mccreedy  Anesthesia: General  Estimated blood loss: None  Drains: Left 6 x 24 JJ ureteral stent without tether  Specimens: stone for analysis  Antibiotics: gentamicin  Findings: left upper pole stone. No hydronephrosis. No masses/lesions in the bladder. Ureteral orifices in normal anatomic location.  Indications: Patient is a 54 year old female with a history of left renal stone and who has persistent left flank pain.  After discussing treatment options, she decided proceed with left ureteroscopic stone manipulation.  Procedure her in detail: The patient was brought to the operating room and a brief timeout was done to ensure correct patient, correct procedure, correct site.  General anesthesia was administered patient was placed in dorsal lithotomy position.  Her genitalia was then prepped and draped in usual sterile fashion.  A rigid 22 French cystoscope was passed in the urethra and the bladder.  Bladder was inspected free masses or lesions.  the ureteral orifices were in the normal orthotopic locations.  a 6 french ureteral catheter was then instilled into the left ureteral orifice.  a gentle retrograde was obtained and findings noted above.  we then placed a zip wire through the ureteral catheter and advanced up to the renal pelvis.  we then removed the cystoscope and cannulated the left ureteral orifice with a semirigid ureteroscope.  No stone was found in the ureter. Once we reached the UPJ a sensor wire was advanced in to the renal pelvis. We then removed the ureteroscope and advanced am 12/14 x 35cm access sheath up to the renal pelvis. We then used the flexible  ureteroscope to perform nephroscopy. We encountered the stone in the lower pole.    the pieces were then removed with a Ngage basket.    The remaining stones fragments dusted. once all stone fragments were removed we then removed the access sheath under direct vision and noted no injury to the ureter. We then placed a 6 x 24 double-j ureteral stent over the original zip wire.  We then removed the wire and good coil was noted in the the renal pelvis under fluoroscopy and the bladder under direct vision. the bladder was then drained and this concluded the procedure which was well tolerated by patient.  Complications: None  Condition: Stable, extubated, transferred to PACU  Plan: Patient is to be discharged home as to follow-up in one week for stent removal.

## 2020-08-08 NOTE — Anesthesia Procedure Notes (Signed)
Procedure Name: Intubation Date/Time: 08/08/2020 9:28 AM Performed by: Georgeanne Nim, CRNA Pre-anesthesia Checklist: Patient identified, Emergency Drugs available, Suction available, Patient being monitored and Timeout performed Patient Re-evaluated:Patient Re-evaluated prior to induction Oxygen Delivery Method: Circle system utilized Preoxygenation: Pre-oxygenation with 100% oxygen Induction Type: IV induction and Rapid sequence Laryngoscope Size: Mac and 4 Grade View: Grade I Tube size: 7.5 mm Number of attempts: 1 Airway Equipment and Method: Stylet (blankets under shoulders) Placement Confirmation: ETT inserted through vocal cords under direct vision,  positive ETCO2,  CO2 detector and breath sounds checked- equal and bilateral Secured at: 21 cm Tube secured with: Tape Dental Injury: Teeth and Oropharynx as per pre-operative assessment

## 2020-08-08 NOTE — Anesthesia Postprocedure Evaluation (Signed)
Anesthesia Post Note  Patient: [name]  Procedure(s) Performed: CYSTOSCOPY WITH RETROGRADE PYELOGRAM, URETEROSCOPY WITH LASER  AND STENT PLACEMENT (Left )  Patient location during evaluation: PACU Anesthesia Type: General Level of consciousness: awake and alert Pain management: pain level controlled Vital Signs Assessment: post-procedure vital signs reviewed and stable Respiratory status: spontaneous breathing Cardiovascular status: stable Postop Assessment: no apparent nausea or vomiting Anesthetic complications: no   No complications documented.   Last Vitals:  Vitals:   08/08/20 0908 08/08/20 1200  BP: (!) 141/78 135/83  Pulse: 75 93  Resp: 18 18  Temp: 37.1 C 36.8 C  SpO2: 96% 92%    Last Pain:  Vitals:   08/08/20 1200  TempSrc:   PainSc: 0-No pain                 Edison Pace

## 2020-08-08 NOTE — Anesthesia Preprocedure Evaluation (Signed)
Anesthesia Evaluation  Patient identified by MRN, date of birth, ID band Patient awake    Reviewed: Allergy & Precautions, H&P , NPO status , Patient's Chart, lab work & pertinent test results, reviewed documented beta blocker date and time   Airway Mallampati: II  TM Distance: >3 FB Neck ROM: full    Dental no notable dental hx. (+) Dental Advisory Given   Pulmonary shortness of breath, sleep apnea ,    Pulmonary exam normal breath sounds clear to auscultation       Cardiovascular Exercise Tolerance: Good hypertension, negative cardio ROS   Rhythm:regular Rate:Normal     Neuro/Psych  Headaches, PSYCHIATRIC DISORDERS Depression    GI/Hepatic Neg liver ROS, GERD  Medicated,  Endo/Other  negative endocrine ROSdiabetes, Poorly Controlled, Type 2, Insulin Dependent  Renal/GU Renal disease  negative genitourinary   Musculoskeletal  (+) Arthritis , Osteoarthritis,    Abdominal   Peds  Hematology negative hematology ROS (+)   Anesthesia Other Findings   Reproductive/Obstetrics negative OB ROS                            Anesthesia Physical  Anesthesia Plan  ASA: III  Anesthesia Plan: General   Post-op Pain Management:    Induction:   PONV Risk Score and Plan: 3  Airway Management Planned: Oral ETT  Additional Equipment:   Intra-op Plan:   Post-operative Plan: Extubation in OR  Informed Consent: I have reviewed the patients History and Physical, chart, labs and discussed the procedure including the risks, benefits and alternatives for the proposed anesthesia with the patient or authorized representative who has indicated his/her understanding and acceptance.     Dental Advisory Given  Plan Discussed with: CRNA and Surgeon  Anesthesia Plan Comments:        Anesthesia Quick Evaluation

## 2020-08-08 NOTE — Transfer of Care (Signed)
Immediate Anesthesia Transfer of Care Note  Patient: [name]  Procedure(s) Performed: CYSTOSCOPY WITH RETROGRADE PYELOGRAM, URETEROSCOPY WITH LASER  AND STENT PLACEMENT (Left )  Patient Location: PACU  Anesthesia Type:General  Level of Consciousness: awake, alert , oriented and patient cooperative  Airway & Oxygen Therapy: Patient Spontanous Breathing  Post-op Assessment: Report given to RN and Post -op Vital signs reviewed and stable  Post vital signs: Reviewed and stable  Last Vitals:  Vitals Value Taken Time  BP 135/83 08/08/20 1200  Temp 36.8 C 08/08/20 1200  Pulse 90 08/08/20 1215  Resp 26 08/08/20 1215  SpO2 92 % 08/08/20 1215  Vitals shown include unvalidated device data.  Last Pain:  Vitals:   08/08/20 1200  TempSrc:   PainSc: 0-No pain      Patients Stated Pain Goal: 7 (08/08/20 0908)  Complications: No complications documented.

## 2020-08-08 NOTE — Discharge Instructions (Signed)

## 2020-08-09 ENCOUNTER — Encounter (HOSPITAL_COMMUNITY): Payer: Self-pay | Admitting: Urology

## 2020-08-11 ENCOUNTER — Telehealth: Payer: Self-pay

## 2020-08-11 LAB — CALCULI, WITH PHOTOGRAPH (CLINICAL LAB)
Calcium Oxalate Dihydrate: 30 %
Calcium Oxalate Monohydrate: 65 %
Hydroxyapatite: 5 %
Weight Calculi: 2 mg

## 2020-08-11 NOTE — Telephone Encounter (Signed)
Pt left a voice mail with the following symptoms:  "blood in my urine"  Pt called back. No answer. Left message to return call. Pt is post op with ureteral stent placement- hematuria with stent placement is to be expected.

## 2020-08-12 ENCOUNTER — Ambulatory Visit: Payer: No Typology Code available for payment source | Admitting: Allergy & Immunology

## 2020-08-16 ENCOUNTER — Other Ambulatory Visit: Payer: No Typology Code available for payment source | Admitting: Urology

## 2020-08-18 ENCOUNTER — Other Ambulatory Visit: Payer: No Typology Code available for payment source | Admitting: Urology

## 2020-08-24 ENCOUNTER — Other Ambulatory Visit: Payer: No Typology Code available for payment source | Admitting: Urology

## 2020-08-31 ENCOUNTER — Other Ambulatory Visit: Payer: Self-pay

## 2020-08-31 ENCOUNTER — Ambulatory Visit (INDEPENDENT_AMBULATORY_CARE_PROVIDER_SITE_OTHER): Payer: Commercial Managed Care - PPO | Admitting: Urology

## 2020-08-31 ENCOUNTER — Encounter: Payer: Self-pay | Admitting: Urology

## 2020-08-31 VITALS — Ht 64.0 in | Wt 201.9 lb

## 2020-08-31 DIAGNOSIS — N200 Calculus of kidney: Secondary | ICD-10-CM | POA: Diagnosis not present

## 2020-08-31 LAB — URINALYSIS, ROUTINE W REFLEX MICROSCOPIC
Bilirubin, UA: NEGATIVE
Glucose, UA: NEGATIVE
Nitrite, UA: NEGATIVE
Specific Gravity, UA: 1.02 (ref 1.005–1.030)
Urobilinogen, Ur: 1 mg/dL (ref 0.2–1.0)
pH, UA: 6 (ref 5.0–7.5)

## 2020-08-31 LAB — MICROSCOPIC EXAMINATION
RBC, Urine: >30 /hpf — AB (ref 0–2)
Renal Epithel, UA: NONE SEEN /hpf

## 2020-08-31 MED ORDER — DOXYCYCLINE HYCLATE 100 MG PO CAPS: 100.0000 mg | ORAL_CAPSULE | Freq: Once | ORAL | 0 refills | Status: AC

## 2020-08-31 NOTE — Patient Instructions (Signed)

## 2020-08-31 NOTE — Progress Notes (Signed)
Urological Symptom Review  Patient is experiencing the following symptoms: Frequent urination Hard to postpone urination Burning/pain with urination Get up at night to urinate Leakage of urine Stream starts and stops Trouble starting stream Have to strain to urinate Blood in urine Weak stream  Kidney stones   Review of Systems  Gastrointestinal (upper)  : Negative for upper GI symptoms  Gastrointestinal (lower) : Negative for lower GI symptoms  Constitutional : Negative for symptoms  Skin: Negative for skin symptoms  Eyes: Negative for eye symptoms  Ear/Nose/Throat : Negative for Ear/Nose/Throat symptoms  Hematologic/Lymphatic: Negative for Hematologic/Lymphatic symptoms  Cardiovascular : Negative for cardiovascular symptoms  Respiratory : Negative for respiratory symptoms  Endocrine: Negative for endocrine symptoms  Musculoskeletal: Negative for musculoskeletal symptoms  Neurological: Negative for neurological symptoms  Psychologic: Anxiety

## 2020-08-31 NOTE — Progress Notes (Signed)
   08/31/20  CC: nephrolithiasis  HPI: Ms Kahler is a 54yo here for followup after left ureteroscopic stone extraction. Height 5\' 4"  (1.626 m), weight 201 lb 15 oz (91.6 kg), last menstrual period 07/16/2012. NED. A&Ox3.   No respiratory distress   Abd soft, NT, ND Normal external genitalia with patent urethral meatus  Cystoscopy Procedure Note  Patient identification was confirmed, informed consent was obtained, and patient was prepped using Betadine solution.  Lidocaine jelly was administered per urethral meatus.    Procedure: - Flexible cystoscope introduced, without any difficulty.   - Thorough search of the bladder revealed:    normal urethral meatus    normal urothelium    no stones    no ulcers     no tumors    no urethral polyps    no trabeculation  - Ureteral orifices were normal in position and appearance.  Using a grasper the left ureteral stent was removed intact  Post-Procedure: - Patient tolerated the procedure well  Assessment/ Plan: Nephrolithiasis: -followup 4-6 weeks with renal 09/15/2012   No follow-ups on file.  [place], MD

## 2020-09-13 ENCOUNTER — Ambulatory Visit: Payer: No Typology Code available for payment source | Admitting: Gastroenterology

## 2020-09-13 ENCOUNTER — Ambulatory Visit: Payer: No Typology Code available for payment source | Admitting: Nurse Practitioner

## 2020-09-14 ENCOUNTER — Ambulatory Visit (HOSPITAL_COMMUNITY)
Admission: RE | Admit: 2020-09-14 | Discharge: 2020-09-14 | Disposition: A | Discharge status: Home / Self Care | Payer: Commercial Managed Care - PPO | Source: Ambulatory Visit | Attending: Urology | Admitting: Urology

## 2020-09-14 ENCOUNTER — Other Ambulatory Visit: Payer: Self-pay

## 2020-09-14 DIAGNOSIS — N200 Calculus of kidney: Secondary | ICD-10-CM | POA: Diagnosis not present

## 2020-09-15 ENCOUNTER — Telehealth: Payer: Self-pay

## 2020-09-23 ENCOUNTER — Ambulatory Visit: Payer: No Typology Code available for payment source | Admitting: Allergy & Immunology

## 2020-09-27 NOTE — Telephone Encounter (Signed)
US shows right calculus but the left is gone with no residual fragments

## 2020-09-27 NOTE — Telephone Encounter (Signed)
-----   Message from Ferdinand Lango, RN sent at 09/27/2020 11:17 AM EDT -----  ----- Message ----- From: Malen Gauze, MD Sent: 09/27/2020   9:40 AM EDT To: Ferdinand Lango, RN  Left stone is gone ----- Message ----- From: Ferdinand Lango, RN Sent: 09/15/2020   8:49 AM EDT To: Malen Gauze, MD  Please review

## 2020-09-28 ENCOUNTER — Ambulatory Visit: Payer: No Typology Code available for payment source | Admitting: Allergy & Immunology

## 2020-09-29 ENCOUNTER — Ambulatory Visit (INDEPENDENT_AMBULATORY_CARE_PROVIDER_SITE_OTHER): Payer: Commercial Managed Care - PPO | Admitting: Urology

## 2020-09-29 ENCOUNTER — Other Ambulatory Visit: Payer: Self-pay | Admitting: Urology

## 2020-09-29 ENCOUNTER — Other Ambulatory Visit: Payer: Self-pay

## 2020-09-29 ENCOUNTER — Ambulatory Visit (HOSPITAL_COMMUNITY)
Admission: RE | Admit: 2020-09-29 | Discharge: 2020-09-29 | Disposition: A | Discharge status: Home / Self Care | Payer: Commercial Managed Care - PPO | Source: Ambulatory Visit | Attending: Urology | Admitting: Urology

## 2020-09-29 VITALS — BP 155/79 | HR 77 | Temp 98.4°F | Ht 64.0 in | Wt 201.9 lb

## 2020-09-29 DIAGNOSIS — N200 Calculus of kidney: Secondary | ICD-10-CM | POA: Insufficient documentation

## 2020-09-29 LAB — MICROSCOPIC EXAMINATION
RBC, Urine: NONE SEEN /hpf (ref 0–2)
Renal Epithel, UA: NONE SEEN /hpf

## 2020-09-29 LAB — URINALYSIS, ROUTINE W REFLEX MICROSCOPIC
Bilirubin, UA: NEGATIVE
Glucose, UA: NEGATIVE
Ketones, UA: NEGATIVE
Nitrite, UA: NEGATIVE
Protein,UA: NEGATIVE
RBC, UA: NEGATIVE
Specific Gravity, UA: 1.02 (ref 1.005–1.030)
Urobilinogen, Ur: 0.2 mg/dL (ref 0.2–1.0)
pH, UA: 6 (ref 5.0–7.5)

## 2020-09-29 NOTE — Telephone Encounter (Signed)
Pt returned call and spoke with Henriette Combs.

## 2020-09-29 NOTE — H&P (View-Only) (Signed)
09/29/2020 10:32 AM   [name] [birth year] [id number]  Referring provider: Benita Stabile, MD [address] [name],  [place] [address]  followup nephrolithiasis  HPI: [name] is a 54yo here for followup for nephrolithiasis. KUB from today shows a right renal calculus. She denies any flank pain. No LUTS. No hematuria. 24 hour urine showed low volume at 0.55L. Low citrate   PMH: Past Medical History:  Diagnosis Date  . Diabetes mellitus    insulin pump 2013  . Hypercholesteremia   . Hypertension   . Kidney stone   . Neuropathy   . Obesity   . Obstructive sleep apnea   . Palpitations   . Shortness of breath     Surgical History: Past Surgical History:  Procedure Laterality Date  . BIOPSY  06/23/2020   Procedure: BIOPSY;  Surgeon: Corbin Ade, MD;  [facility];  Service: Endoscopy;;  . CARDIAC CATHETERIZATION  12/11/2012   normal coronary arteries  . CESAREAN SECTION  1994;2000   [facility]  . CHOLECYSTECTOMY  1992  . COLONOSCOPY WITH PROPOFOL N/A 06/23/2020   Procedure: COLONOSCOPY WITH PROPOFOL;  Surgeon: Corbin Ade, MD;  [facility];  Service: Endoscopy;  Laterality: N/A;  7:30am  . CYSTOSCOPY W/ URETERAL STENT PLACEMENT  05/08/2012   Procedure: CYSTOSCOPY WITH RETROGRADE PYELOGRAM/URETERAL STENT PLACEMENT;  Surgeon: Ky Barban, MD;  Location: [facility];  Service: Urology;  Laterality: Right;  . CYSTOSCOPY WITH RETROGRADE PYELOGRAM, URETEROSCOPY AND STENT PLACEMENT Right 05/04/2020   Procedure: CYSTOSCOPY WITH RIGHT RETROGRADE PYELOGRAM, RIGHT URETEROSCOPY AND RIGHT URETERAL STENT EXCHANGE;  Surgeon: Malen Gauze, MD;  Location: [facility];  Service: Urology;  Laterality: Right;  . CYSTOSCOPY WITH RETROGRADE PYELOGRAM, URETEROSCOPY AND STENT PLACEMENT Left 08/08/2020   Procedure: CYSTOSCOPY WITH RETROGRADE PYELOGRAM, URETEROSCOPY WITH LASER  AND STENT PLACEMENT;  Surgeon: Malen Gauze, MD;  Location: [facility];  Service:  Urology;  Laterality: Left;  . CYSTOSCOPY WITH STENT PLACEMENT Right 02/25/2020   Procedure: CYSTOSCOPY WITH RIGHT RETROGRADE RIGHT STENT PLACEMENT;  Surgeon: Jerilee Field, MD;  Location: [facility];  Service: Urology;  Laterality: Right;  . ESOPHAGOGASTRODUODENOSCOPY (EGD) WITH PROPOFOL N/A 06/23/2020   Procedure: ESOPHAGOGASTRODUODENOSCOPY (EGD) WITH PROPOFOL;  Surgeon: Corbin Ade, MD;  [facility];  Service: Endoscopy;  Laterality: N/A;  . FOOT SURGERY Right March, 2013   [facility]-removal of bone spur  . HOLMIUM LASER APPLICATION  05/04/2020   Procedure: HOLMIUM LASER LITHOTRIPSY RIGHT URETERAL CALCULUS;  Surgeon: Malen Gauze, MD;  Location: [facility];  Service: Urology;;  . HYSTEROSCOPY WITH THERMACHOICE  04/25/2012   Procedure: HYSTEROSCOPY WITH THERMACHOICE;  Surgeon: Lazaro Arms, MD;  Location: [facility];  Service: Gynecology;  Laterality: N/A;  total therapy time= 11 minutes 42 seconds; 34 ml D5w  in and 34 ml D5w out; temp =87 degrees F  . LEFT HEART CATHETERIZATION WITH CORONARY ANGIOGRAM N/A 12/11/2012   Procedure: LEFT HEART CATHETERIZATION WITH CORONARY ANGIOGRAM;  Surgeon: Runell Gess, MD;  Location: [facility] CATH LAB;  Service: Cardiovascular;  Laterality: N/A;  . [name] DILATION N/A 06/23/2020   Procedure: [name] DILATION;  Surgeon: Corbin Ade, MD;  [facility];  Service: Endoscopy;  Laterality: N/A;  . Sinus sergery  1988   [place]  . UMBILICAL HERNIA REPAIR N/A 03/25/2014   Procedure: UMBILICAL HERNIA REPAIR;  Surgeon: Marlane Hatcher, MD;  Location: [facility];  Service: General;  Laterality: N/A;  .  uterine ablation      Home Medications:  Allergies as of 09/29/2020      Reactions   Ciprofloxacin Hives, Swelling   Penicillins Anaphylaxis, Rash   Codeine Other (See Comments)   had hallicunations   Morphine Nausea And Vomiting, Other (See Comments)   REACTION:   recieved in ED due to [facility] and had multple doses - gave  visual hallucinations and vomitting.   Sulfonamide Derivatives Other (See Comments)   REACTION: Unsure - childhood allergy   Vancomycin Itching      Medication List       Accurate as of September 29, 2020 10:32 AM. If you have any questions, ask your nurse or doctor.        albuterol 108 (90 Base) MCG/ACT inhaler Commonly known as: VENTOLIN HFA Inhale 2 puffs into the lungs every 6 (six) hours as needed for wheezing or shortness of breath.   ALPRAZolam 0.5 MG tablet Commonly known as: XANAX Take 0.5 mg by mouth at bedtime.   ARIPiprazole 2 MG tablet Commonly known as: ABILIFY Take 2 mg by mouth at bedtime.   buPROPion 150 MG 24 hr tablet Commonly known as: WELLBUTRIN XL Take 150 mg by mouth daily.   furosemide 20 MG tablet Commonly known as: LASIX Take 20 mg by mouth daily.   gabapentin 600 MG tablet Commonly known as: NEURONTIN Take 600 mg by mouth 3 (three) times daily.   HumaLOG 100 UNIT/ML injection Generic drug: insulin lispro Inject 3.5 Units into the skin continuous. 3.5 units every 1 hr-insulin pump   multivitamin with minerals Tabs tablet Take 1 tablet by mouth daily.   omeprazole 40 MG capsule Commonly known as: PRILOSEC Take 40 mg by mouth daily.   oxybutynin 5 MG 24 hr tablet Commonly known as: DITROPAN-XL Take 5 mg by mouth daily.   oxyCODONE-acetaminophen 5-325 MG tablet Commonly known as: Percocet Take 1 tablet by mouth every 4 (four) hours as needed for severe pain.   Potassium 99 MG Tabs Take 99 mg by mouth daily.   simvastatin 40 MG tablet Commonly known as: ZOCOR Take 40 mg by mouth daily.       Allergies:  Allergies  Allergen Reactions  . Ciprofloxacin Hives and Swelling  . Penicillins Anaphylaxis and Rash  . Codeine Other (See Comments)    had hallicunations  . Morphine Nausea And Vomiting and Other (See Comments)    REACTION:   recieved in ED due to [facility] and had multple doses - gave visual hallucinations and  vomitting.  . Sulfonamide Derivatives Other (See Comments)    REACTION: Unsure - childhood allergy  . Vancomycin Itching    Family History: Family History  Problem Relation Age of Onset  . Diabetes Mother   . Depression Paternal Grandmother   . Depression Paternal Grandfather   . Heart disease Other   . Cancer Other   . Diabetes Other   . Achalasia Other   . Anesthesia problems Neg Hx   . Hypotension Neg Hx   . Malignant hyperthermia Neg Hx   . Pseudochol deficiency Neg Hx     Social History:  reports that she has never smoked. She has never used smokeless tobacco. She reports that she does not drink alcohol and does not use drugs.  ROS: All other review of systems were reviewed and are negative except what is noted above in HPI  Physical Exam: BP (!) 155/79   Pulse 77   Temp 98.4 F (36.9 C)  Ht 5\' 4"  (1.626 m)   Wt 201 lb 15 oz (91.6 kg)   LMP 07/16/2012   BMI 34.66 kg/m   Constitutional:  Alert and oriented, No acute distress. HEENT: [place] AT, moist mucus membranes.  Trachea midline, no masses. Cardiovascular: No clubbing, cyanosis, or edema. Respiratory: Normal respiratory effort, no increased work of breathing. GI: Abdomen is soft, nontender, nondistended, no abdominal masses GU: No CVA tenderness.  Lymph: No cervical or inguinal lymphadenopathy. Skin: No rashes, bruises or suspicious lesions. Neurologic: Grossly intact, no focal deficits, moving all 4 extremities. Psychiatric: Normal mood and affect.  Laboratory Data: Lab Results  Component Value Date   WBC 11.4 (H) 05/02/2020   HGB 12.6 05/04/2020   HCT 37.0 05/04/2020   MCV 86.4 05/02/2020   PLT 384 05/02/2020    Lab Results  Component Value Date   CREATININE 0.76 08/05/2020    No results found for: PSA  No results found for: TESTOSTERONE  Lab Results  Component Value Date   HGBA1C 7.3 (H) 08/05/2020    Urinalysis    Component Value Date/Time   COLORURINE AMBER (A) 04/04/2020 0309    APPEARANCEUR Cloudy (A) 08/31/2020 1003   LABSPEC 1.020 04/04/2020 0309   PHURINE 6.0 04/04/2020 0309   GLUCOSEU Negative 08/31/2020 1003   HGBUR LARGE (A) 04/04/2020 0309   HGBUR moderate 08/31/2008 0908   BILIRUBINUR Negative 08/31/2020 1003   KETONESUR NEGATIVE 04/04/2020 0309   PROTEINUR 3+ (A) 08/31/2020 1003   PROTEINUR 100 (A) 04/04/2020 0309   UROBILINOGEN 0.2 05/25/2020 1555   UROBILINOGEN 0.2 09/21/2014 1055   NITRITE Negative 08/31/2020 1003   NITRITE NEGATIVE 04/04/2020 0309   LEUKOCYTESUR 1+ (A) 08/31/2020 1003   LEUKOCYTESUR SMALL (A) 04/04/2020 0309    Lab Results  Component Value Date   LABMICR See below: 08/31/2020   WBCUA 6-10 (A) 08/31/2020   LABEPIT 0-10 08/31/2020   BACTERIA Few (A) 08/31/2020    Pertinent Imaging: KUB today: Images reviewed and disucssed with the patient Results for orders placed during the hospital encounter of 06/01/09  DG Abd 1 View  Narrative Clinical Data: Right side pain.  History of urinary tract stones.  ABDOMEN - 1 VIEW  Comparison: Abdomen 06/21/2008.  Findings: As on the prior study, there is a calcification projecting over the right kidney consistent with a 0.9 cm stone. As on the prior study, the stone may be in the right renal pelvis. No new unexpected abdominal calcification is identified.  Bowel gas pattern is unremarkable.  No focal bony abnormality.  IMPRESSION: Unchanged 0.9 cm right renal stone.  Provider: Wilkie Aye  No results found for this or any previous visit.  No results found for this or any previous visit.  No results found for this or any previous visit.  Results for orders placed during the hospital encounter of 09/14/20  Ultrasound renal complete  Narrative CLINICAL DATA:  Nephrolithiasis  EXAM: RENAL / URINARY TRACT ULTRASOUND COMPLETE  COMPARISON:  06/30/2020, CT 04/04/2020  FINDINGS: Right Kidney:  Renal measurements: 11.2 x 5.7 x 6.4 cm = volume: 213 mL. Echogenicity  within normal limits. No hydronephrosis. Small echogenic stone at the lower pole measuring 8 mm.  Left Kidney:  Renal measurements: 11.7 x 5.3 x 6.4 cm = volume: 208 mL. Echogenicity within normal limits. No hydronephrosis. Echogenic shadowing stone within the mid to upper left kidney is not clearly identified today.  Bladder:  Appears normal for degree of bladder distention.  Other:  None.  IMPRESSION: 1. Probable  8 mm stone in the right kidney 2. Previously noted 2.8 cm echogenic area with shadowing/probable stone in the mid to upper left kidney is not identified on today's study.   Electronically Signed By: Jasmine Pang M.D. On: 09/14/2020 23:24  No results found for this or any previous visit.  No results found for this or any previous visit.  Results for orders placed during the hospital encounter of 04/04/20  CT RENAL STONE STUDY  Narrative CLINICAL DATA:  Left flank pain and hematuria  EXAM: CT ABDOMEN AND PELVIS WITHOUT CONTRAST  TECHNIQUE: Multidetector CT imaging of the abdomen and pelvis was performed following the standard protocol without IV contrast.  COMPARISON:  February 25, 2020  FINDINGS: Lower chest: The visualized heart size within normal limits. No pericardial fluid/thickening.  No hiatal hernia.  The visualized portions of the lungs are clear.  Hepatobiliary: Although limited due to the lack of intravenous contrast, normal in appearance without gross focal abnormality. The patient is status post cholecystectomy. No biliary ductal dilation.  Pancreas:  Unremarkable.  No surrounding inflammatory changes.  Spleen: Normal in size. Although limited due to the lack of intravenous contrast, normal in appearance.  Adrenals/Urinary Tract: Both adrenal glands appear normal. There is been interval placement a right-sided double-J ureteral catheter. There is a clustered 1 cm calculus seen within the lower pole of the right kidney. There  appears to be mild stranding changes seen around the right ureter extending to the UVJ. There remains mild right ureterectasis present. There is ureterectasis. Again noted are multiple clustered calcifications within the upper pole left kidney extending into the renal pelvis measuring 1.7 cm. There is tiny punctate calcification pole the left kidney measuring 3 mm. No left-sided hydronephrosis is seen.  Stomach/Bowel: The stomach, small bowel, and colon are normal in appearance. No inflammatory changes or obstructive findings. appendix is normal.  Vascular/Lymphatic: There are no enlarged abdominal or pelvic lymph nodes. No significant gross vascular findings are present.  Reproductive: The uterus and adnexa are unremarkable.  Other: No evidence of abdominal wall mass or hernia.  Musculoskeletal: No acute or significant osseous findings.  IMPRESSION: 1. Interval placement right-sided double-J ureteral stent with mild residual ureterectasis. There is also periureteral fat stranding changes seen to the level of the UVJ which could be due to mild ureteritis. 2. Unchanged bilateral renal calculi   Electronically Signed By: Jonna Clark M.D. On: 04/04/2020 02:13   Assessment & Plan:    1. Nephrolithiasis -We discussed the management of kidney stones. These options include observation, ureteroscopy, shockwave lithotripsy (ESWL) and percutaneous nephrolithotomy (PCNL). We discussed which options are relevant to the patient's stone(s). We discussed the natural history of kidney stones as well as the complications of untreated stones and the impact on quality of life without treatment as well as with each of the above listed treatments. We also discussed the efficacy of each treatment in its ability to clear the stone burden. With any of these management options I discussed the signs and symptoms of infection and the need for emergent treatment should these be experienced. For each option  we discussed the ability of each procedure to clear the patient of their stone burden.   For observation I described the risks which include but are not limited to silent renal damage, life-threatening infection, need for emergent surgery, failure to pass stone and pain.   For ureteroscopy I described the risks which include bleeding, infection, damage to contiguous structures, positioning injury, ureteral stricture, ureteral avulsion,  ureteral injury, need for prolonged ureteral stent, inability to perform ureteroscopy, need for an interval procedure, inability to clear stone burden, stent discomfort/pain, heart attack, stroke, pulmonary embolus and the inherent risks with general anesthesia.   For shockwave lithotripsy I described the risks which include arrhythmia, kidney contusion, kidney hemorrhage, need for transfusion, pain, inability to adequately break up stone, inability to pass stone fragments, Steinstrasse, infection associated with obstructing stones, need for alternate surgical procedure, need for repeat shockwave lithotripsy, MI, CVA, PE and the inherent risks with anesthesia/conscious sedation.   For PCNL I described the risks including positioning injury, pneumothorax, hydrothorax, need for chest tube, inability to clear stone burden, renal laceration, arterial venous fistula or malformation, need for embolization of kidney, loss of kidney or renal function, need for repeat procedure, need for prolonged nephrostomy tube, ureteral avulsion, MI, CVA, PE and the inherent risks of general anesthesia.   - The patient would like to proceed with Right ESWL - Urinalysis, Routine w reflex microscopic - Abdomen 1 view (KUB)   No follow-ups on file.  Wilkie Aye, MD  [facility] [place]

## 2020-09-29 NOTE — Progress Notes (Signed)
Urological Symptom Review  Patient is experiencing the following symptoms: Frequent urination Hard to postpone urination Get up at night to urinate Leakage of urine Stream starts and stops Trouble starting stream Have to strain to urinate  Kidney stones  Review of Systems  Gastrointestinal (upper)  : Negative for upper GI symptoms  Gastrointestinal (lower) : Negative for lower GI symptoms  Constitutional : Fatigue  Skin: Negative for skin symptoms  Eyes: Negative for eye symptoms  Ear/Nose/Throat : Negative for Ear/Nose/Throat symptoms  Hematologic/Lymphatic: Negative for Hematologic/Lymphatic symptoms  Cardiovascular : Negative for cardiovascular symptoms  Respiratory : Negative for respiratory symptoms  Endocrine: Negative for endocrine symptoms  Musculoskeletal: Negative for musculoskeletal symptoms  Neurological: Headaches  Psychologic: Depression Anxiety

## 2020-09-29 NOTE — Progress Notes (Signed)
09/29/2020 10:32 AM   [name] [birth year] [id number]  Referring provider: Benita Stabile, MD [address] [place],  [place] [address]  followup nephrolithiasis  HPI: [name] is a 54yo here for followup for nephrolithiasis. KUB from today shows a right renal calculus. She denies any flank pain. No LUTS. No hematuria. 24 hour urine showed low volume at 0.55L. Low citrate   PMH: Past Medical History:  Diagnosis Date  . Diabetes mellitus    insulin pump 2013  . Hypercholesteremia   . Hypertension   . Kidney stone   . Neuropathy   . Obesity   . Obstructive sleep apnea   . Palpitations   . Shortness of breath     Surgical History: Past Surgical History:  Procedure Laterality Date  . BIOPSY  06/23/2020   Procedure: BIOPSY;  Surgeon: Corbin Ade, MD;  [facility];  Service: Endoscopy;;  . CARDIAC CATHETERIZATION  12/11/2012   normal coronary arteries  . CESAREAN SECTION  1994;2000   [facility]  . CHOLECYSTECTOMY  1992  . COLONOSCOPY WITH PROPOFOL N/A 06/23/2020   Procedure: COLONOSCOPY WITH PROPOFOL;  Surgeon: Corbin Ade, MD;  [facility];  Service: Endoscopy;  Laterality: N/A;  7:30am  . CYSTOSCOPY W/ URETERAL STENT PLACEMENT  05/08/2012   Procedure: CYSTOSCOPY WITH RETROGRADE PYELOGRAM/URETERAL STENT PLACEMENT;  Surgeon: Ky Barban, MD;  Location: [facility];  Service: Urology;  Laterality: Right;  . CYSTOSCOPY WITH RETROGRADE PYELOGRAM, URETEROSCOPY AND STENT PLACEMENT Right 05/04/2020   Procedure: CYSTOSCOPY WITH RIGHT RETROGRADE PYELOGRAM, RIGHT URETEROSCOPY AND RIGHT URETERAL STENT EXCHANGE;  Surgeon: Malen Gauze, MD;  Location: [facility];  Service: Urology;  Laterality: Right;  . CYSTOSCOPY WITH RETROGRADE PYELOGRAM, URETEROSCOPY AND STENT PLACEMENT Left 08/08/2020   Procedure: CYSTOSCOPY WITH RETROGRADE PYELOGRAM, URETEROSCOPY WITH LASER  AND STENT PLACEMENT;  Surgeon: Malen Gauze, MD;  Location: [facility];  Service:  Urology;  Laterality: Left;  . CYSTOSCOPY WITH STENT PLACEMENT Right 02/25/2020   Procedure: CYSTOSCOPY WITH RIGHT RETROGRADE RIGHT STENT PLACEMENT;  Surgeon: Jerilee Field, MD;  Location: [facility];  Service: Urology;  Laterality: Right;  . ESOPHAGOGASTRODUODENOSCOPY (EGD) WITH PROPOFOL N/A 06/23/2020   Procedure: ESOPHAGOGASTRODUODENOSCOPY (EGD) WITH PROPOFOL;  Surgeon: Corbin Ade, MD;  [facility];  Service: Endoscopy;  Laterality: N/A;  . FOOT SURGERY Right March, 2013   [facility]-removal of bone spur  . HOLMIUM LASER APPLICATION  05/04/2020   Procedure: HOLMIUM LASER LITHOTRIPSY RIGHT URETERAL CALCULUS;  Surgeon: Malen Gauze, MD;  Location: [facility];  Service: Urology;;  . HYSTEROSCOPY WITH THERMACHOICE  04/25/2012   Procedure: HYSTEROSCOPY WITH THERMACHOICE;  Surgeon: Lazaro Arms, MD;  Location: [facility];  Service: Gynecology;  Laterality: N/A;  total therapy time= 11 minutes 42 seconds; 34 ml D5w  in and 34 ml D5w out; temp =87 degrees F  . LEFT HEART CATHETERIZATION WITH CORONARY ANGIOGRAM N/A 12/11/2012   Procedure: LEFT HEART CATHETERIZATION WITH CORONARY ANGIOGRAM;  Surgeon: Runell Gess, MD;  Location: [facility] CATH LAB;  Service: Cardiovascular;  Laterality: N/A;  . [name] DILATION N/A 06/23/2020   Procedure: [name] DILATION;  Surgeon: Corbin Ade, MD;  [facility];  Service: Endoscopy;  Laterality: N/A;  . Sinus sergery  1988   [place]  . UMBILICAL HERNIA REPAIR N/A 03/25/2014   Procedure: UMBILICAL HERNIA REPAIR;  Surgeon: Marlane Hatcher, MD;  Location: [facility];  Service: General;  Laterality: N/A;  .  uterine ablation      Home Medications:  Allergies as of 09/29/2020      Reactions   Ciprofloxacin Hives, Swelling   Penicillins Anaphylaxis, Rash   Codeine Other (See Comments)   had hallicunations   Morphine Nausea And Vomiting, Other (See Comments)   REACTION:   recieved in ED due to [facility] and had multple doses - gave  visual hallucinations and vomitting.   Sulfonamide Derivatives Other (See Comments)   REACTION: Unsure - childhood allergy   Vancomycin Itching      Medication List       Accurate as of September 29, 2020 10:32 AM. If you have any questions, ask your nurse or doctor.        albuterol 108 (90 Base) MCG/ACT inhaler Commonly known as: VENTOLIN HFA Inhale 2 puffs into the lungs every 6 (six) hours as needed for wheezing or shortness of breath.   ALPRAZolam 0.5 MG tablet Commonly known as: XANAX Take 0.5 mg by mouth at bedtime.   ARIPiprazole 2 MG tablet Commonly known as: ABILIFY Take 2 mg by mouth at bedtime.   buPROPion 150 MG 24 hr tablet Commonly known as: WELLBUTRIN XL Take 150 mg by mouth daily.   furosemide 20 MG tablet Commonly known as: LASIX Take 20 mg by mouth daily.   gabapentin 600 MG tablet Commonly known as: NEURONTIN Take 600 mg by mouth 3 (three) times daily.   HumaLOG 100 UNIT/ML injection Generic drug: insulin lispro Inject 3.5 Units into the skin continuous. 3.5 units every 1 hr-insulin pump   multivitamin with minerals Tabs tablet Take 1 tablet by mouth daily.   omeprazole 40 MG capsule Commonly known as: PRILOSEC Take 40 mg by mouth daily.   oxybutynin 5 MG 24 hr tablet Commonly known as: DITROPAN-XL Take 5 mg by mouth daily.   oxyCODONE-acetaminophen 5-325 MG tablet Commonly known as: Percocet Take 1 tablet by mouth every 4 (four) hours as needed for severe pain.   Potassium 99 MG Tabs Take 99 mg by mouth daily.   simvastatin 40 MG tablet Commonly known as: ZOCOR Take 40 mg by mouth daily.       Allergies:  Allergies  Allergen Reactions  . Ciprofloxacin Hives and Swelling  . Penicillins Anaphylaxis and Rash  . Codeine Other (See Comments)    had hallicunations  . Morphine Nausea And Vomiting and Other (See Comments)    REACTION:   recieved in ED due to [facility] and had multple doses - gave visual hallucinations and  vomitting.  . Sulfonamide Derivatives Other (See Comments)    REACTION: Unsure - childhood allergy  . Vancomycin Itching    Family History: Family History  Problem Relation Age of Onset  . Diabetes Mother   . Depression Paternal Grandmother   . Depression Paternal Grandfather   . Heart disease Other   . Cancer Other   . Diabetes Other   . Achalasia Other   . Anesthesia problems Neg Hx   . Hypotension Neg Hx   . Malignant hyperthermia Neg Hx   . Pseudochol deficiency Neg Hx     Social History:  reports that she has never smoked. She has never used smokeless tobacco. She reports that she does not drink alcohol and does not use drugs.  ROS: All other review of systems were reviewed and are negative except what is noted above in HPI  Physical Exam: BP (!) 155/79   Pulse 77   Temp 98.4 F (36.9 C)  Ht 5\' 4"  (1.626 m)   Wt 201 lb 15 oz (91.6 kg)   LMP 07/16/2012   BMI 34.66 kg/m   Constitutional:  Alert and oriented, No acute distress. HEENT: [place] AT, moist mucus membranes.  Trachea midline, no masses. Cardiovascular: No clubbing, cyanosis, or edema. Respiratory: Normal respiratory effort, no increased work of breathing. GI: Abdomen is soft, nontender, nondistended, no abdominal masses GU: No CVA tenderness.  Lymph: No cervical or inguinal lymphadenopathy. Skin: No rashes, bruises or suspicious lesions. Neurologic: Grossly intact, no focal deficits, moving all 4 extremities. Psychiatric: Normal mood and affect.  Laboratory Data: Lab Results  Component Value Date   WBC 11.4 (H) 05/02/2020   HGB 12.6 05/04/2020   HCT 37.0 05/04/2020   MCV 86.4 05/02/2020   PLT 384 05/02/2020    Lab Results  Component Value Date   CREATININE 0.76 08/05/2020    No results found for: PSA  No results found for: TESTOSTERONE  Lab Results  Component Value Date   HGBA1C 7.3 (H) 08/05/2020    Urinalysis    Component Value Date/Time   COLORURINE AMBER (A) 04/04/2020 0309    APPEARANCEUR Cloudy (A) 08/31/2020 1003   LABSPEC 1.020 04/04/2020 0309   PHURINE 6.0 04/04/2020 0309   GLUCOSEU Negative 08/31/2020 1003   HGBUR LARGE (A) 04/04/2020 0309   HGBUR moderate 08/31/2008 0908   BILIRUBINUR Negative 08/31/2020 1003   KETONESUR NEGATIVE 04/04/2020 0309   PROTEINUR 3+ (A) 08/31/2020 1003   PROTEINUR 100 (A) 04/04/2020 0309   UROBILINOGEN 0.2 05/25/2020 1555   UROBILINOGEN 0.2 09/21/2014 1055   NITRITE Negative 08/31/2020 1003   NITRITE NEGATIVE 04/04/2020 0309   LEUKOCYTESUR 1+ (A) 08/31/2020 1003   LEUKOCYTESUR SMALL (A) 04/04/2020 0309    Lab Results  Component Value Date   LABMICR See below: 08/31/2020   WBCUA 6-10 (A) 08/31/2020   LABEPIT 0-10 08/31/2020   BACTERIA Few (A) 08/31/2020    Pertinent Imaging: KUB today: Images reviewed and disucssed with the patient Results for orders placed during the hospital encounter of 06/01/09  DG Abd 1 View  Narrative Clinical Data: Right side pain.  History of urinary tract stones.  ABDOMEN - 1 VIEW  Comparison: Abdomen 06/21/2008.  Findings: As on the prior study, there is a calcification projecting over the right kidney consistent with a 0.9 cm stone. As on the prior study, the stone may be in the right renal pelvis. No new unexpected abdominal calcification is identified.  Bowel gas pattern is unremarkable.  No focal bony abnormality.  IMPRESSION: Unchanged 0.9 cm right renal stone.  Provider: Wilkie Aye  No results found for this or any previous visit.  No results found for this or any previous visit.  No results found for this or any previous visit.  Results for orders placed during the hospital encounter of 09/14/20  Ultrasound renal complete  Narrative CLINICAL DATA:  Nephrolithiasis  EXAM: RENAL / URINARY TRACT ULTRASOUND COMPLETE  COMPARISON:  06/30/2020, CT 04/04/2020  FINDINGS: Right Kidney:  Renal measurements: 11.2 x 5.7 x 6.4 cm = volume: 213 mL. Echogenicity  within normal limits. No hydronephrosis. Small echogenic stone at the lower pole measuring 8 mm.  Left Kidney:  Renal measurements: 11.7 x 5.3 x 6.4 cm = volume: 208 mL. Echogenicity within normal limits. No hydronephrosis. Echogenic shadowing stone within the mid to upper left kidney is not clearly identified today.  Bladder:  Appears normal for degree of bladder distention.  Other:  None.  IMPRESSION: 1. Probable  8 mm stone in the right kidney 2. Previously noted 2.8 cm echogenic area with shadowing/probable stone in the mid to upper left kidney is not identified on today's study.   Electronically Signed By: Jasmine Pang M.D. On: 09/14/2020 23:24  No results found for this or any previous visit.  No results found for this or any previous visit.  Results for orders placed during the hospital encounter of 04/04/20  CT RENAL STONE STUDY  Narrative CLINICAL DATA:  Left flank pain and hematuria  EXAM: CT ABDOMEN AND PELVIS WITHOUT CONTRAST  TECHNIQUE: Multidetector CT imaging of the abdomen and pelvis was performed following the standard protocol without IV contrast.  COMPARISON:  February 25, 2020  FINDINGS: Lower chest: The visualized heart size within normal limits. No pericardial fluid/thickening.  No hiatal hernia.  The visualized portions of the lungs are clear.  Hepatobiliary: Although limited due to the lack of intravenous contrast, normal in appearance without gross focal abnormality. The patient is status post cholecystectomy. No biliary ductal dilation.  Pancreas:  Unremarkable.  No surrounding inflammatory changes.  Spleen: Normal in size. Although limited due to the lack of intravenous contrast, normal in appearance.  Adrenals/Urinary Tract: Both adrenal glands appear normal. There is been interval placement a right-sided double-J ureteral catheter. There is a clustered 1 cm calculus seen within the lower pole of the right kidney. There  appears to be mild stranding changes seen around the right ureter extending to the UVJ. There remains mild right ureterectasis present. There is ureterectasis. Again noted are multiple clustered calcifications within the upper pole left kidney extending into the renal pelvis measuring 1.7 cm. There is tiny punctate calcification pole the left kidney measuring 3 mm. No left-sided hydronephrosis is seen.  Stomach/Bowel: The stomach, small bowel, and colon are normal in appearance. No inflammatory changes or obstructive findings. appendix is normal.  Vascular/Lymphatic: There are no enlarged abdominal or pelvic lymph nodes. No significant gross vascular findings are present.  Reproductive: The uterus and adnexa are unremarkable.  Other: No evidence of abdominal wall mass or hernia.  Musculoskeletal: No acute or significant osseous findings.  IMPRESSION: 1. Interval placement right-sided double-J ureteral stent with mild residual ureterectasis. There is also periureteral fat stranding changes seen to the level of the UVJ which could be due to mild ureteritis. 2. Unchanged bilateral renal calculi   Electronically Signed By: Jonna Clark M.D. On: 04/04/2020 02:13   Assessment & Plan:    1. Nephrolithiasis -We discussed the management of kidney stones. These options include observation, ureteroscopy, shockwave lithotripsy (ESWL) and percutaneous nephrolithotomy (PCNL). We discussed which options are relevant to the patient's stone(s). We discussed the natural history of kidney stones as well as the complications of untreated stones and the impact on quality of life without treatment as well as with each of the above listed treatments. We also discussed the efficacy of each treatment in its ability to clear the stone burden. With any of these management options I discussed the signs and symptoms of infection and the need for emergent treatment should these be experienced. For each option  we discussed the ability of each procedure to clear the patient of their stone burden.   For observation I described the risks which include but are not limited to silent renal damage, life-threatening infection, need for emergent surgery, failure to pass stone and pain.   For ureteroscopy I described the risks which include bleeding, infection, damage to contiguous structures, positioning injury, ureteral stricture, ureteral avulsion,  ureteral injury, need for prolonged ureteral stent, inability to perform ureteroscopy, need for an interval procedure, inability to clear stone burden, stent discomfort/pain, heart attack, stroke, pulmonary embolus and the inherent risks with general anesthesia.   For shockwave lithotripsy I described the risks which include arrhythmia, kidney contusion, kidney hemorrhage, need for transfusion, pain, inability to adequately break up stone, inability to pass stone fragments, Steinstrasse, infection associated with obstructing stones, need for alternate surgical procedure, need for repeat shockwave lithotripsy, MI, CVA, PE and the inherent risks with anesthesia/conscious sedation.   For PCNL I described the risks including positioning injury, pneumothorax, hydrothorax, need for chest tube, inability to clear stone burden, renal laceration, arterial venous fistula or malformation, need for embolization of kidney, loss of kidney or renal function, need for repeat procedure, need for prolonged nephrostomy tube, ureteral avulsion, MI, CVA, PE and the inherent risks of general anesthesia.   - The patient would like to proceed with Right ESWL - Urinalysis, Routine w reflex microscopic - Abdomen 1 view (KUB)   No follow-ups on file.  Harless Molinari, MD  [facility] [place]  

## 2020-09-30 ENCOUNTER — Ambulatory Visit: Payer: Commercial Managed Care - PPO | Admitting: Urology

## 2020-09-30 ENCOUNTER — Ambulatory Visit: Payer: No Typology Code available for payment source | Admitting: Allergy & Immunology

## 2020-10-05 ENCOUNTER — Encounter: Payer: Self-pay | Admitting: Urology

## 2020-10-05 ENCOUNTER — Other Ambulatory Visit: Payer: Self-pay

## 2020-10-05 ENCOUNTER — Encounter (HOSPITAL_COMMUNITY)
Admission: RE | Admit: 2020-10-05 | Discharge: 2020-10-05 | Disposition: A | Discharge status: Home / Self Care | Payer: Commercial Managed Care - PPO | Source: Ambulatory Visit | Attending: Urology | Admitting: Urology

## 2020-10-05 NOTE — Patient Instructions (Signed)
Dietary Guidelines to Help Prevent Kidney Stones Kidney stones are deposits of minerals and salts that form inside your kidneys. Your risk of developing kidney stones may be greater depending on your diet, your lifestyle, the medicines you take, and whether you have certain medical conditions. Most people can reduce their chances of developing kidney stones by following the instructions below. Depending on your overall health and the type of kidney stones you tend to develop, your dietitian may give you more specific instructions. What are tips for following this plan? Reading food labels  Choose foods with "no salt added" or "low-salt" labels. Limit your sodium intake to less than 1500 mg per day.  Choose foods with calcium for each meal and snack. Try to eat about 300 mg of calcium at each meal. Foods that contain 200-500 mg of calcium per serving include: ? 8 oz (237 ml) of milk, fortified nondairy milk, and fortified fruit juice. ? 8 oz (237 ml) of kefir, yogurt, and soy yogurt. ? 4 oz (118 ml) of tofu. ? 1 oz of cheese. ? 1 cup (300 g) of dried figs. ? 1 cup (91 g) of cooked broccoli. ? 1-3 oz can of sardines or mackerel.  Most people need 1000 to 1500 mg of calcium each day. Talk to your dietitian about how much calcium is recommended for you. Shopping  Buy plenty of fresh fruits and vegetables. Most people do not need to avoid fruits and vegetables, even if they contain nutrients that may contribute to kidney stones.  When shopping for convenience foods, choose: ? Whole pieces of fruit. ? Premade salads with dressing on the side. ? Low-fat fruit and yogurt smoothies.  Avoid buying frozen meals or prepared deli foods.  Look for foods with live cultures, such as yogurt and kefir. Cooking  Do not add salt to food when cooking. Place a salt shaker on the table and allow each person to add his or her own salt to taste.  Use vegetable protein, such as beans, textured vegetable  protein (TVP), or tofu instead of meat in pasta, casseroles, and soups. Meal planning   Eat less salt, if told by your dietitian. To do this: ? Avoid eating processed or premade food. ? Avoid eating fast food.  Eat less animal protein, including cheese, meat, poultry, or fish, if told by your dietitian. To do this: ? Limit the number of times you have meat, poultry, fish, or cheese each week. Eat a diet free of meat at least 2 days a week. ? Eat only one serving each day of meat, poultry, fish, or seafood. ? When you prepare animal protein, cut pieces into small portion sizes. For most meat and fish, one serving is about the size of one deck of cards.  Eat at least 5 servings of fresh fruits and vegetables each day. To do this: ? Keep fruits and vegetables on hand for snacks. ? Eat 1 piece of fruit or a handful of berries with breakfast. ? Have a salad and fruit at lunch. ? Have two kinds of vegetables at dinner.  Limit foods that are high in a substance called oxalate. These include: ? Spinach. ? Rhubarb. ? Beets. ? Potato chips and french fries. ? Nuts.  If you regularly take a diuretic medicine, make sure to eat at least 1-2 fruits or vegetables high in potassium each day. These include: ? Avocado. ? Banana. ? Orange, prune, carrot, or tomato juice. ? Baked potato. ? Cabbage. ? Beans and split   peas. General instructions   Drink enough fluid to keep your urine clear or pale yellow. This is the most important thing you can do.  Talk to your health care provider and dietitian about taking daily supplements. Depending on your health and the cause of your kidney stones, you may be advised: ? Not to take supplements with vitamin C. ? To take a calcium supplement. ? To take a daily probiotic supplement. ? To take other supplements such as magnesium, fish oil, or vitamin B6.  Take all medicines and supplements as told by your health care provider.  Limit alcohol intake to no  more than 1 drink a day for nonpregnant women and 2 drinks a day for men. One drink equals 12 oz of beer, 5 oz of wine, or 1 oz of hard liquor.  Lose weight if told by your health care provider. Work with your dietitian to find strategies and an eating plan that works best for you. What foods are not recommended? Limit your intake of the following foods, or as told by your dietitian. Talk to your dietitian about specific foods you should avoid based on the type of kidney stones and your overall health. Grains Breads. Bagels. Rolls. Baked goods. Salted crackers. Cereal. Pasta. Vegetables Spinach. Rhubarb. Beets. Canned vegetables. Pickles. Olives. Meats and other protein foods Nuts. Nut butters. Large portions of meat, poultry, or fish. Salted or cured meats. Deli meats. Hot dogs. Sausages. Dairy Cheese. Beverages Regular soft drinks. Regular vegetable juice. Seasonings and other foods Seasoning blends with salt. Salad dressings. Canned soups. Soy sauce. Ketchup. Barbecue sauce. Canned pasta sauce. Casseroles. Pizza. Lasagna. Frozen meals. Potato chips. French fries. Summary  You can reduce your risk of kidney stones by making changes to your diet.  The most important thing you can do is drink enough fluid. You should drink enough fluid to keep your urine clear or pale yellow.  Ask your health care provider or dietitian how much protein from animal sources you should eat each day, and also how much salt and calcium you should have each day. This information is not intended to replace advice given to you by your health care provider. Make sure you discuss any questions you have with your health care provider. Document Revised: 03/18/2019 Document Reviewed: 11/06/2016 Elsevier Patient Education  2020 Elsevier Inc.  

## 2020-10-07 ENCOUNTER — Other Ambulatory Visit (HOSPITAL_COMMUNITY)
Admission: RE | Admit: 2020-10-07 | Discharge: 2020-10-07 | Disposition: A | Discharge status: Home / Self Care | Payer: Commercial Managed Care - PPO | Source: Ambulatory Visit | Attending: Urology | Admitting: Urology

## 2020-10-07 ENCOUNTER — Other Ambulatory Visit: Payer: Self-pay

## 2020-10-07 ENCOUNTER — Other Ambulatory Visit (HOSPITAL_COMMUNITY): Payer: Commercial Managed Care - PPO

## 2020-10-07 ENCOUNTER — Encounter (HOSPITAL_COMMUNITY): Payer: Self-pay

## 2020-10-07 DIAGNOSIS — Z01812 Encounter for preprocedural laboratory examination: Secondary | ICD-10-CM | POA: Diagnosis present

## 2020-10-07 DIAGNOSIS — Z20822 Contact with and (suspected) exposure to covid-19: Secondary | ICD-10-CM | POA: Diagnosis not present

## 2020-10-07 LAB — SARS CORONAVIRUS 2 (TAT 6-24 HRS): SARS Coronavirus 2: NEGATIVE

## 2020-10-11 ENCOUNTER — Ambulatory Visit (HOSPITAL_COMMUNITY)
Admission: RE | Admit: 2020-10-11 | Discharge: 2020-10-11 | Disposition: A | Discharge status: Home / Self Care | Payer: Commercial Managed Care - PPO | Source: Home / Self Care | Attending: Urology | Admitting: Urology

## 2020-10-11 ENCOUNTER — Ambulatory Visit (HOSPITAL_COMMUNITY)
Admission: RE | Admit: 2020-10-11 | Discharge: 2020-10-11 | Disposition: A | Discharge status: Home / Self Care | Payer: Commercial Managed Care - PPO | Attending: Urology | Admitting: Urology

## 2020-10-11 ENCOUNTER — Encounter (HOSPITAL_COMMUNITY)
Admission: RE | Disposition: A | Discharge status: Home / Self Care | Payer: Self-pay | Source: Home / Self Care | Attending: Urology

## 2020-10-11 ENCOUNTER — Encounter (HOSPITAL_COMMUNITY): Payer: Self-pay | Admitting: Urology

## 2020-10-11 ENCOUNTER — Other Ambulatory Visit: Payer: Self-pay

## 2020-10-11 DIAGNOSIS — N200 Calculus of kidney: Secondary | ICD-10-CM

## 2020-10-11 HISTORY — PX: EXTRACORPOREAL SHOCK WAVE LITHOTRIPSY: SHX1557

## 2020-10-11 LAB — GLUCOSE, CAPILLARY: Glucose-Capillary: 129 mg/dL — ABNORMAL HIGH (ref 70–99)

## 2020-10-11 SURGERY — LITHOTRIPSY, ESWL
Anesthesia: LOCAL | Laterality: Right

## 2020-10-11 MED ORDER — LACTATED RINGERS IV SOLN: INTRAVENOUS | Status: DC

## 2020-10-11 MED ORDER — DIAZEPAM 5 MG PO TABS
10.0000 mg | ORAL_TABLET | Freq: Once | ORAL | Status: AC
  Administered 2020-10-11: 10 mg via ORAL
  Filled 2020-10-11: qty 2

## 2020-10-11 MED ORDER — OXYCODONE-ACETAMINOPHEN 5-325 MG PO TABS
1.0000 | ORAL_TABLET | ORAL | 0 refills | Status: DC | PRN
Start: 2020-10-11 — End: 2020-10-31

## 2020-10-11 MED ORDER — ONDANSETRON HCL 4 MG PO TABS
4.0000 mg | ORAL_TABLET | Freq: Three times a day (TID) | ORAL | 0 refills | Status: DC | PRN
Start: 2020-10-11 — End: 2020-12-27

## 2020-10-11 MED ORDER — DIPHENHYDRAMINE HCL 25 MG PO CAPS
25.0000 mg | ORAL_CAPSULE | ORAL | Status: AC
  Administered 2020-10-11: 25 mg via ORAL
  Filled 2020-10-11: qty 1

## 2020-10-11 MED ORDER — TAMSULOSIN HCL 0.4 MG PO CAPS: 0.4000 mg | ORAL_CAPSULE | Freq: Every day | ORAL | 0 refills | Status: DC

## 2020-10-11 NOTE — Interval H&P Note (Signed)
History and Physical Interval Note:  10/11/2020 9:48 AM  [name]  has presented today for surgery, with the diagnosis of right renal calculus.  The various methods of treatment have been discussed with the patient and family. After consideration of risks, benefits and other options for treatment, the patient has consented to  Procedure(s): EXTRACORPOREAL SHOCK WAVE LITHOTRIPSY (ESWL) (Right) as a surgical intervention.  The patient's history has been reviewed, patient examined, no change in status, stable for surgery.  I have reviewed the patient's chart and labs.  Questions were answered to the patient's satisfaction.     Wilkie Aye

## 2020-10-11 NOTE — Interval H&P Note (Signed)
History and Physical Interval Note:  10/11/2020 12:12 PM  [name]  has presented today for surgery, with the diagnosis of right renal calculus.  The various methods of treatment have been discussed with the patient and family. After consideration of risks, benefits and other options for treatment, the patient has consented to  Procedure(s): EXTRACORPOREAL SHOCK WAVE LITHOTRIPSY (ESWL) (Right) as a surgical intervention.  The patient's history has been reviewed, patient examined, no change in status, stable for surgery.  I have reviewed the patient's chart and labs.  Questions were answered to the patient's satisfaction.     Wilkie Aye

## 2020-10-12 ENCOUNTER — Encounter (HOSPITAL_COMMUNITY): Payer: Self-pay | Admitting: Urology

## 2020-10-13 ENCOUNTER — Other Ambulatory Visit: Payer: Self-pay

## 2020-10-13 DIAGNOSIS — Z9889 Other specified postprocedural states: Secondary | ICD-10-CM

## 2020-10-17 ENCOUNTER — Telehealth: Payer: Self-pay

## 2020-10-17 NOTE — Telephone Encounter (Signed)
Received fax from the pharmacy, pantoprzole not covered. Tried to do a PA for pantoprazole 40mg  for this pt.  PA was denied. Per [facility] health care does not allow low dose PPI because they are available over the counter.  I looked up pts formulary, pantoprazole is preferred but only if prescribing 80mg  a day. Dr.Rourk, Do you want the pt to change to something OTC? Or change her to bid dosing of pantoprazole?

## 2020-10-18 MED ORDER — PANTOPRAZOLE SODIUM 40 MG PO TBEC: 40.0000 mg | DELAYED_RELEASE_TABLET | Freq: Two times a day (BID) | ORAL | 3 refills | Status: DC

## 2020-10-18 NOTE — Telephone Encounter (Signed)
Call in a prescription for Protonix 40 mg twice daily.  Dispense 180 with 3 refills.  Patient can start taking twice daily but try and see if she can cut back to once a day within the next 2 weeks.  Needs an office visit at some point between now and the first of the year.

## 2020-10-18 NOTE — Telephone Encounter (Signed)
Called pt- NA- LM on voicemail with instructions and that I was sending in rx and it may still need a PA but I would try to get it approved for her.  Also LM that Dr.Rourk wanted her to have an OV. Misty Stanley, please see Dr.Rourk's recommendations and schedule ov for pt.

## 2020-10-19 ENCOUNTER — Encounter: Payer: Self-pay | Admitting: Internal Medicine

## 2020-10-19 NOTE — Telephone Encounter (Signed)
Patient scheduled and letter sent  °

## 2020-10-27 ENCOUNTER — Ambulatory Visit: Payer: Commercial Managed Care - PPO | Admitting: Urology

## 2020-10-31 ENCOUNTER — Other Ambulatory Visit: Payer: Self-pay

## 2020-10-31 ENCOUNTER — Ambulatory Visit (HOSPITAL_COMMUNITY)
Admission: RE | Admit: 2020-10-31 | Discharge: 2020-10-31 | Disposition: A | Discharge status: Home / Self Care | Payer: Commercial Managed Care - PPO | Source: Ambulatory Visit | Attending: Urology | Admitting: Urology

## 2020-10-31 ENCOUNTER — Ambulatory Visit (INDEPENDENT_AMBULATORY_CARE_PROVIDER_SITE_OTHER): Payer: Commercial Managed Care - PPO | Admitting: Urology

## 2020-10-31 ENCOUNTER — Encounter: Payer: Self-pay | Admitting: Urology

## 2020-10-31 VITALS — BP 103/68 | HR 80 | Temp 98.3°F

## 2020-10-31 DIAGNOSIS — Z9889 Other specified postprocedural states: Secondary | ICD-10-CM | POA: Insufficient documentation

## 2020-10-31 DIAGNOSIS — R1012 Left upper quadrant pain: Secondary | ICD-10-CM | POA: Diagnosis present

## 2020-10-31 DIAGNOSIS — N201 Calculus of ureter: Secondary | ICD-10-CM | POA: Diagnosis not present

## 2020-10-31 DIAGNOSIS — M549 Dorsalgia, unspecified: Secondary | ICD-10-CM | POA: Diagnosis not present

## 2020-10-31 LAB — URINALYSIS, ROUTINE W REFLEX MICROSCOPIC
Bilirubin, UA: NEGATIVE
Glucose, UA: NEGATIVE
Ketones, UA: NEGATIVE
Nitrite, UA: NEGATIVE
Protein,UA: NEGATIVE
RBC, UA: NEGATIVE
Specific Gravity, UA: >1.03 — ABNORMAL HIGH (ref 1.005–1.030)
Urobilinogen, Ur: 0.2 mg/dL (ref 0.2–1.0)
pH, UA: 5 (ref 5.0–7.5)

## 2020-10-31 LAB — MICROSCOPIC EXAMINATION: RBC, Urine: NONE SEEN /hpf (ref 0–2)

## 2020-10-31 MED ORDER — OXYCODONE-ACETAMINOPHEN 5-325 MG PO TABS: 1.0000 | ORAL_TABLET | ORAL | 0 refills | Status: DC | PRN

## 2020-10-31 MED ORDER — CYCLOBENZAPRINE HCL 5 MG PO TABS: 5.0000 mg | ORAL_TABLET | Freq: Three times a day (TID) | ORAL | 0 refills | Status: DC | PRN

## 2020-10-31 NOTE — Patient Instructions (Signed)

## 2020-10-31 NOTE — Progress Notes (Signed)
10/31/2020 9:50 AM   [name] Aug 10, 1966 [id number]  Referring provider: Benita Stabile, MD [address] [place],  [place] [address]  Followup nephrolithiasis  HPI: [name] is a 54yo here for followup for nephrolithiasis. She underwent ESWL 3 weeks ago. She passed numerous pieces. She developed new left flank pain 1 week ago with associated nausea and vomiting. KUb from this morning shows small right side stone burden and new left renal calculus. CT stone study from today was reviewed with the patient and shows bilateral lower pole calculi   PMH: Past Medical History:  Diagnosis Date  . Diabetes mellitus    insulin pump 2013  . Hypercholesteremia   . Hypertension   . Kidney stone   . Neuropathy   . Obesity   . Obstructive sleep apnea   . Palpitations   . Shortness of breath     Surgical History: Past Surgical History:  Procedure Laterality Date  . BIOPSY  06/23/2020   Procedure: BIOPSY;  Surgeon: Corbin Ade, MD;  [facility];  Service: Endoscopy;;  . CARDIAC CATHETERIZATION  12/11/2012   normal coronary arteries  . CESAREAN SECTION  1994;2000   [facility]  . CHOLECYSTECTOMY  1992  . COLONOSCOPY WITH PROPOFOL N/A 06/23/2020   Procedure: COLONOSCOPY WITH PROPOFOL;  Surgeon: Corbin Ade, MD;  [facility];  Service: Endoscopy;  Laterality: N/A;  7:30am  . CYSTOSCOPY W/ URETERAL STENT PLACEMENT  05/08/2012   Procedure: CYSTOSCOPY WITH RETROGRADE PYELOGRAM/URETERAL STENT PLACEMENT;  Surgeon: Ky Barban, MD;  Location: [facility];  Service: Urology;  Laterality: Right;  . CYSTOSCOPY WITH RETROGRADE PYELOGRAM, URETEROSCOPY AND STENT PLACEMENT Right 05/04/2020   Procedure: CYSTOSCOPY WITH RIGHT RETROGRADE PYELOGRAM, RIGHT URETEROSCOPY AND RIGHT URETERAL STENT EXCHANGE;  Surgeon: Malen Gauze, MD;  Location: [facility];  Service: Urology;  Laterality: Right;  . CYSTOSCOPY WITH RETROGRADE PYELOGRAM, URETEROSCOPY AND STENT PLACEMENT Left  08/08/2020   Procedure: CYSTOSCOPY WITH RETROGRADE PYELOGRAM, URETEROSCOPY WITH LASER  AND STENT PLACEMENT;  Surgeon: Malen Gauze, MD;  Location: [facility];  Service: Urology;  Laterality: Left;  . CYSTOSCOPY WITH STENT PLACEMENT Right 02/25/2020   Procedure: CYSTOSCOPY WITH RIGHT RETROGRADE RIGHT STENT PLACEMENT;  Surgeon: Jerilee Field, MD;  Location: [facility];  Service: Urology;  Laterality: Right;  . ESOPHAGOGASTRODUODENOSCOPY (EGD) WITH PROPOFOL N/A 06/23/2020   Procedure: ESOPHAGOGASTRODUODENOSCOPY (EGD) WITH PROPOFOL;  Surgeon: Corbin Ade, MD;  [facility];  Service: Endoscopy;  Laterality: N/A;  . EXTRACORPOREAL SHOCK WAVE LITHOTRIPSY Right 10/11/2020   Procedure: EXTRACORPOREAL SHOCK WAVE LITHOTRIPSY (ESWL);  Surgeon: Malen Gauze, MD;  Location: [facility];  Service: Urology;  Laterality: Right;  . FOOT SURGERY Right March, 2013   [facility]-removal of bone spur  . HOLMIUM LASER APPLICATION  05/04/2020   Procedure: HOLMIUM LASER LITHOTRIPSY RIGHT URETERAL CALCULUS;  Surgeon: Malen Gauze, MD;  Location: [facility];  Service: Urology;;  . HYSTEROSCOPY WITH THERMACHOICE  04/25/2012   Procedure: HYSTEROSCOPY WITH THERMACHOICE;  Surgeon: Lazaro Arms, MD;  Location: [facility];  Service: Gynecology;  Laterality: N/A;  total therapy time= 11 minutes 42 seconds; 34 ml D5w  in and 34 ml D5w out; temp =87 degrees F  . LEFT HEART CATHETERIZATION WITH CORONARY ANGIOGRAM N/A 12/11/2012   Procedure: LEFT HEART CATHETERIZATION WITH CORONARY ANGIOGRAM;  Surgeon: Runell Gess, MD;  Location: [facility] C[facility] LAB;  Service: Cardiovascular;  Laterality: N/A;  . MALONEY DILATION N/A 06/23/2020  Procedure: MALONEY DILATION;  Surgeon: Corbin Ade, MD;  [facility];  Service: Endoscopy;  Laterality: N/A;  . Sinus sergery  1988   [place]  . UMBILICAL HERNIA REPAIR N/A 03/25/2014   Procedure: UMBILICAL HERNIA REPAIR;  Surgeon: Marlane Hatcher, MD;  Location: [facility];   Service: General;  Laterality: N/A;  . uterine ablation      Home Medications:  Allergies as of 10/31/2020      Reactions   Ciprofloxacin Hives, Swelling   Penicillins Anaphylaxis, Rash   Codeine Other (See Comments)   had hallicunations   Morphine Nausea And Vomiting, Other (See Comments)   recieved in ED due to [facility] and had multple doses - gave visual hallucinations and vomitting.   Sulfonamide Derivatives Other (See Comments)   REACTION: Unsure - childhood allergy   Vancomycin Itching      Medication List       Accurate as of October 31, 2020  9:50 AM. If you have any questions, ask your nurse or doctor.        albuterol 108 (90 Base) MCG/ACT inhaler Commonly known as: VENTOLIN HFA Inhale 2 puffs into the lungs every 6 (six) hours as needed for wheezing or shortness of breath.   ALPRAZolam 0.5 MG tablet Commonly known as: XANAX Take 0.5 mg by mouth at bedtime.   ARIPiprazole 2 MG tablet Commonly known as: ABILIFY Take 2 mg by mouth at bedtime.   buPROPion 150 MG 24 hr tablet Commonly known as: WELLBUTRIN XL Take 150 mg by mouth daily.   desvenlafaxine 50 MG 24 hr tablet Commonly known as: PRISTIQ Take 50 mg by mouth at bedtime.   Desvenlafaxine Succinate ER 25 MG Tb24 Take 1 tablet by mouth daily.   furosemide 20 MG tablet Commonly known as: LASIX Take 40 mg by mouth 2 (two) times daily.   gabapentin 600 MG tablet Commonly known as: NEURONTIN Take 600 mg by mouth 3 (three) times daily.   HumaLOG 100 UNIT/ML injection Generic drug: insulin lispro Inject 3.5 Units into the skin continuous. 3.5 units every 1 hr-insulin pump   ibuprofen 200 MG tablet Commonly known as: ADVIL Take 400 mg by mouth every 6 (six) hours as needed for headache or moderate pain.   multivitamin with minerals Tabs tablet Take 1 tablet by mouth daily.   omeprazole 40 MG capsule Commonly known as: PRILOSEC Take 40 mg by mouth daily.   ondansetron 4 MG  tablet Commonly known as: ZOFRAN Take 1 tablet (4 mg total) by mouth every 8 (eight) hours as needed.   oxybutynin 5 MG tablet Commonly known as: DITROPAN Take 5 mg by mouth at bedtime.   oxyCODONE-acetaminophen 5-325 MG tablet Commonly known as: Percocet Take 1 tablet by mouth every 4 (four) hours as needed for severe pain.   pantoprazole 40 MG tablet Commonly known as: PROTONIX Take 1 tablet (40 mg total) by mouth 2 (two) times daily.   Potassium 99 MG Tabs Take 99 mg by mouth daily.   simvastatin 40 MG tablet Commonly known as: ZOCOR Take 40 mg by mouth daily.   tamsulosin 0.4 MG Caps capsule Commonly known as: Flomax Take 1 capsule (0.4 mg total) by mouth daily after supper.       Allergies:  Allergies  Allergen Reactions  . Ciprofloxacin Hives and Swelling  . Penicillins Anaphylaxis and Rash  . Codeine Other (See Comments)    had hallicunations  . Morphine Nausea And Vomiting and Other (See Comments)  recieved in ED due to [facility] and had multple doses - gave visual hallucinations and vomitting.  . Sulfonamide Derivatives Other (See Comments)    REACTION: Unsure - childhood allergy  . Vancomycin Itching    Family History: Family History  Problem Relation Age of Onset  . Diabetes Mother   . Depression Paternal Grandmother   . Depression Paternal Grandfather   . Heart disease Other   . Cancer Other   . Diabetes Other   . Achalasia Other   . Anesthesia problems Neg Hx   . Hypotension Neg Hx   . Malignant hyperthermia Neg Hx   . Pseudochol deficiency Neg Hx     Social History:  reports that she has never smoked. She has never used smokeless tobacco. She reports that she does not drink alcohol and does not use drugs.  ROS: All other review of systems were reviewed and are negative except what is noted above in HPI  Physical Exam: BP 103/68   Pulse 80   Temp 98.3 F (36.8 C)   LMP 07/16/2012   Constitutional:  Alert and oriented, No acute  distress. HEENT: [place] AT, moist mucus membranes.  Trachea midline, no masses. Cardiovascular: No clubbing, cyanosis, or edema. Respiratory: Normal respiratory effort, no increased work of breathing. GI: Abdomen is soft, nontender, nondistended, no abdominal masses GU: No CVA tenderness.  Lymph: No cervical or inguinal lymphadenopathy. Skin: No rashes, bruises or suspicious lesions. Neurologic: Grossly intact, no focal deficits, moving all 4 extremities. Psychiatric: Normal mood and affect.  Laboratory Data: Lab Results  Component Value Date   WBC 11.4 (H) 05/02/2020   HGB 12.6 05/04/2020   HCT 37.0 05/04/2020   MCV 86.4 05/02/2020   PLT 384 05/02/2020    Lab Results  Component Value Date   CREATININE 0.76 08/05/2020    No results found for: PSA  No results found for: TESTOSTERONE  Lab Results  Component Value Date   HGBA1C 7.3 (H) 08/05/2020    Urinalysis    Component Value Date/Time   COLORURINE AMBER (A) 04/04/2020 0309   APPEARANCEUR Clear 09/29/2020 0936   LABSPEC 1.020 04/04/2020 0309   PHURINE 6.0 04/04/2020 0309   GLUCOSEU Negative 09/29/2020 0936   HGBUR LARGE (A) 04/04/2020 0309   HGBUR moderate 08/31/2008 0908   BILIRUBINUR Negative 09/29/2020 0936   KETONESUR NEGATIVE 04/04/2020 0309   PROTEINUR Negative 09/29/2020 0936   PROTEINUR 100 (A) 04/04/2020 0309   UROBILINOGEN 0.2 05/25/2020 1555   UROBILINOGEN 0.2 09/21/2014 1055   NITRITE Negative 09/29/2020 0936   NITRITE NEGATIVE 04/04/2020 0309   LEUKOCYTESUR 1+ (A) 09/29/2020 0936   LEUKOCYTESUR SMALL (A) 04/04/2020 0309    Lab Results  Component Value Date   LABMICR See below: 09/29/2020   WBCUA 6-10 (A) 09/29/2020   LABEPIT 0-10 09/29/2020   BACTERIA Few 09/29/2020    Pertinent Imaging: KUb today: Images reviewed and discussed with the patient Results for orders placed during the hospital encounter of 10/11/20  DG Abd 1 View  Narrative CLINICAL DATA:  Pre lithotripsy  EXAM: ABDOMEN  - 1 VIEW  COMPARISON:  09/29/2020  FINDINGS: Right lower pole renal stones are again noted, stable. No additional visualized stones. Prior cholecystectomy. Nonobstructive bowel gas pattern. Moderate stool in the colon.  IMPRESSION: Right lower pole nephrolithiasis, stable.   Electronically Signed By: Charlett Nose M.D. On: 10/12/2020 03:14  No results found for this or any previous visit.  No results found for this or any previous visit.  No  results found for this or any previous visit.  Results for orders placed during the hospital encounter of 09/14/20  Ultrasound renal complete  Narrative CLINICAL DATA:  Nephrolithiasis  EXAM: RENAL / URINARY TRACT ULTRASOUND COMPLETE  COMPARISON:  06/30/2020, CT 04/04/2020  FINDINGS: Right Kidney:  Renal measurements: 11.2 x 5.7 x 6.4 cm = volume: 213 mL. Echogenicity within normal limits. No hydronephrosis. Small echogenic stone at the lower pole measuring 8 mm.  Left Kidney:  Renal measurements: 11.7 x 5.3 x 6.4 cm = volume: 208 mL. Echogenicity within normal limits. No hydronephrosis. Echogenic shadowing stone within the mid to upper left kidney is not clearly identified today.  Bladder:  Appears normal for degree of bladder distention.  Other:  None.  IMPRESSION: 1. Probable 8 mm stone in the right kidney 2. Previously noted 2.8 cm echogenic area with shadowing/probable stone in the mid to upper left kidney is not identified on today's study.   Electronically Signed By: Jasmine Pang M.D. On: 09/14/2020 23:24  No results found for this or any previous visit.  No results found for this or any previous visit.  Results for orders placed during the hospital encounter of 04/04/20  CT RENAL STONE STUDY  Narrative CLINICAL DATA:  Left flank pain and hematuria  EXAM: CT ABDOMEN AND PELVIS WITHOUT CONTRAST  TECHNIQUE: Multidetector CT imaging of the abdomen and pelvis was performed following the  standard protocol without IV contrast.  COMPARISON:  February 25, 2020  FINDINGS: Lower chest: The visualized heart size within normal limits. No pericardial fluid/thickening.  No hiatal hernia.  The visualized portions of the lungs are clear.  Hepatobiliary: Although limited due to the lack of intravenous contrast, normal in appearance without gross focal abnormality. The patient is status post cholecystectomy. No biliary ductal dilation.  Pancreas:  Unremarkable.  No surrounding inflammatory changes.  Spleen: Normal in size. Although limited due to the lack of intravenous contrast, normal in appearance.  Adrenals/Urinary Tract: Both adrenal glands appear normal. There is been interval placement a right-sided double-J ureteral catheter. There is a clustered 1 cm calculus seen within the lower pole of the right kidney. There appears to be mild stranding changes seen around the right ureter extending to the UVJ. There remains mild right ureterectasis present. There is ureterectasis. Again noted are multiple clustered calcifications within the upper pole left kidney extending into the renal pelvis measuring 1.7 cm. There is tiny punctate calcification pole the left kidney measuring 3 mm. No left-sided hydronephrosis is seen.  Stomach/Bowel: The stomach, small bowel, and colon are normal in appearance. No inflammatory changes or obstructive findings. appendix is normal.  Vascular/Lymphatic: There are no enlarged abdominal or pelvic lymph nodes. No significant gross vascular findings are present.  Reproductive: The uterus and adnexa are unremarkable.  Other: No evidence of abdominal wall mass or hernia.  Musculoskeletal: No acute or significant osseous findings.  IMPRESSION: 1. Interval placement right-sided double-J ureteral stent with mild residual ureterectasis. There is also periureteral fat stranding changes seen to the level of the UVJ which could be due to  mild ureteritis. 2. Unchanged bilateral renal calculi   Electronically Signed By: Jonna Clark M.D. On: 04/04/2020 02:13   Assessment & Plan:    1. Ureterolithiasis -We discussed the management of kidney stones. These options include observation, ureteroscopy, shockwave lithotripsy (ESWL) and percutaneous nephrolithotomy (PCNL). We discussed which options are relevant to the patient's stone(s). We discussed the natural history of kidney stones as well as the complications of  untreated stones and the impact on quality of life without treatment as well as with each of the above listed treatments. We also discussed the efficacy of each treatment in its ability to clear the stone burden. With any of these management options I discussed the signs and symptoms of infection and the need for emergent treatment should these be experienced. For each option we discussed the ability of each procedure to clear the patient of their stone burden.   For observation I described the risks which include but are not limited to silent renal damage, life-threatening infection, need for emergent surgery, failure to pass stone and pain.   For ureteroscopy I described the risks which include bleeding, infection, damage to contiguous structures, positioning injury, ureteral stricture, ureteral avulsion, ureteral injury, need for prolonged ureteral stent, inability to perform ureteroscopy, need for an interval procedure, inability to clear stone burden, stent discomfort/pain, heart attack, stroke, pulmonary embolus and the inherent risks with general anesthesia.   For shockwave lithotripsy I described the risks which include arrhythmia, kidney contusion, kidney hemorrhage, need for transfusion, pain, inability to adequately break up stone, inability to pass stone fragments, Steinstrasse, infection associated with obstructing stones, need for alternate surgical procedure, need for repeat shockwave lithotripsy, MI, CVA, PE  and the inherent risks with anesthesia/conscious sedation.   For PCNL I described the risks including positioning injury, pneumothorax, hydrothorax, need for chest tube, inability to clear stone burden, renal laceration, arterial venous fistula or malformation, need for embolization of kidney, loss of kidney or renal function, need for repeat procedure, need for prolonged nephrostomy tube, ureteral avulsion, MI, CVA, PE and the inherent risks of general anesthesia.   - The patient would like to proceed with right ureteroscopic stone extraction  2. Dorsalgia: -flexeril 5mg  prn   No follow-ups on file.  , MD  [facility] [place]

## 2020-10-31 NOTE — Progress Notes (Signed)
Urological Symptom Review  Patient is experiencing the following symptoms: Frequent urination Hard to postpone urination Get up at night to urinate Leakage of urine Stream starts and stops Trouble starting stream Have to strain to urinate Blood in urine Weak stream   Review of Systems  Gastrointestinal (upper)  : Nausea  Gastrointestinal (lower) : Negative for lower GI symptoms  Constitutional : Negative for symptoms  Skin: Negative for skin symptoms  Eyes: Negative for eye symptoms  Ear/Nose/Throat : Negative for Ear/Nose/Throat symptoms  Hematologic/Lymphatic: Negative for Hematologic/Lymphatic symptoms  Cardiovascular : Negative for cardiovascular symptoms  Respiratory : Negative for respiratory symptoms  Endocrine: Negative for endocrine symptoms  Musculoskeletal: Negative for musculoskeletal symptoms  Neurological: Headaches Dizziness  Psychologic: Depression Anxiety

## 2020-10-31 NOTE — H&P (View-Only) (Signed)
 10/31/2020 9:50 AM   [name] 02/07/1966 [id number]  Referring provider: Hall, John Z, MD [address] [place],  [place] [address]  Followup nephrolithiasis  HPI: [name] is a 54yo here for followup for nephrolithiasis. She underwent ESWL 3 weeks ago. She passed numerous pieces. She developed new left flank pain 1 week ago with associated nausea and vomiting. KUb from this morning shows small right side stone burden and new left renal calculus. CT stone study from today was reviewed with the patient and shows bilateral lower pole calculi   PMH: Past Medical History:  Diagnosis Date  . Diabetes mellitus    insulin pump 2013  . Hypercholesteremia   . Hypertension   . Kidney stone   . Neuropathy   . Obesity   . Obstructive sleep apnea   . Palpitations   . Shortness of breath     Surgical History: Past Surgical History:  Procedure Laterality Date  . BIOPSY  06/23/2020   Procedure: BIOPSY;  Surgeon: Rourk, Robert M, MD;  [facility];  Service: Endoscopy;;  . CARDIAC CATHETERIZATION  12/11/2012   normal coronary arteries  . CESAREAN SECTION  1994;2000   [facility]  . CHOLECYSTECTOMY  1992  . COLONOSCOPY WITH PROPOFOL N/A 06/23/2020   Procedure: COLONOSCOPY WITH PROPOFOL;  Surgeon: Rourk, Robert M, MD;  [facility];  Service: Endoscopy;  Laterality: N/A;  7:30am  . CYSTOSCOPY W/ URETERAL STENT PLACEMENT  05/08/2012   Procedure: CYSTOSCOPY WITH RETROGRADE PYELOGRAM/URETERAL STENT PLACEMENT;  Surgeon: Mohammad I Javaid, MD;  Location: [facility];  Service: Urology;  Laterality: Right;  . CYSTOSCOPY WITH RETROGRADE PYELOGRAM, URETEROSCOPY AND STENT PLACEMENT Right 05/04/2020   Procedure: CYSTOSCOPY WITH RIGHT RETROGRADE PYELOGRAM, RIGHT URETEROSCOPY AND RIGHT URETERAL STENT EXCHANGE;  Surgeon: Jera Headings L, MD;  Location: [facility];  Service: Urology;  Laterality: Right;  . CYSTOSCOPY WITH RETROGRADE PYELOGRAM, URETEROSCOPY AND STENT PLACEMENT Left  08/08/2020   Procedure: CYSTOSCOPY WITH RETROGRADE PYELOGRAM, URETEROSCOPY WITH LASER  AND STENT PLACEMENT;  Surgeon: Tagg Eustice L, MD;  Location: [facility];  Service: Urology;  Laterality: Left;  . CYSTOSCOPY WITH STENT PLACEMENT Right 02/25/2020   Procedure: CYSTOSCOPY WITH RIGHT RETROGRADE RIGHT STENT PLACEMENT;  Surgeon: Eskridge, Matthew, MD;  Location: [facility];  Service: Urology;  Laterality: Right;  . ESOPHAGOGASTRODUODENOSCOPY (EGD) WITH PROPOFOL N/A 06/23/2020   Procedure: ESOPHAGOGASTRODUODENOSCOPY (EGD) WITH PROPOFOL;  Surgeon: Rourk, Robert M, MD;  [facility];  Service: Endoscopy;  Laterality: N/A;  . EXTRACORPOREAL SHOCK WAVE LITHOTRIPSY Right 10/11/2020   Procedure: EXTRACORPOREAL SHOCK WAVE LITHOTRIPSY (ESWL);  Surgeon: Zyir Gassert L, MD;  Location: [facility];  Service: Urology;  Laterality: Right;  . FOOT SURGERY Right March, 2013   [facility]-removal of bone spur  . HOLMIUM LASER APPLICATION  05/04/2020   Procedure: HOLMIUM LASER LITHOTRIPSY RIGHT URETERAL CALCULUS;  Surgeon: Ilah Boule L, MD;  Location: [facility];  Service: Urology;;  . HYSTEROSCOPY WITH THERMACHOICE  04/25/2012   Procedure: HYSTEROSCOPY WITH THERMACHOICE;  Surgeon: Luther H Eure, MD;  Location: [facility];  Service: Gynecology;  Laterality: N/A;  total therapy time= 11 minutes 42 seconds; 34 ml D5w  in and 34 ml D5w out; temp =87 degrees F  . LEFT HEART CATHETERIZATION WITH CORONARY ANGIOGRAM N/A 12/11/2012   Procedure: LEFT HEART CATHETERIZATION WITH CORONARY ANGIOGRAM;  Surgeon: Jonathan J Berry, MD;  [facility]: [facility];  Service: Cardiovascular;  Laterality: N/A;  . MALONEY DILATION N/A 06/23/2020     Procedure: MALONEY DILATION;  Surgeon: Corbin Ade, MD;  [facility];  Service: Endoscopy;  Laterality: N/A;  . Sinus sergery  1988   [place]  . UMBILICAL HERNIA REPAIR N/A 03/25/2014   Procedure: UMBILICAL HERNIA REPAIR;  Surgeon: Marlane Hatcher, MD;  Location: [facility];   Service: General;  Laterality: N/A;  . uterine ablation      Home Medications:  Allergies as of 10/31/2020      Reactions   Ciprofloxacin Hives, Swelling   Penicillins Anaphylaxis, Rash   Codeine Other (See Comments)   had hallicunations   Morphine Nausea And Vomiting, Other (See Comments)   recieved in ED due to [facility] and had multple doses - gave visual hallucinations and vomitting.   Sulfonamide Derivatives Other (See Comments)   REACTION: Unsure - childhood allergy   Vancomycin Itching      Medication List       Accurate as of October 31, 2020  9:50 AM. If you have any questions, ask your nurse or doctor.        albuterol 108 (90 Base) MCG/ACT inhaler Commonly known as: VENTOLIN HFA Inhale 2 puffs into the lungs every 6 (six) hours as needed for wheezing or shortness of breath.   ALPRAZolam 0.5 MG tablet Commonly known as: XANAX Take 0.5 mg by mouth at bedtime.   ARIPiprazole 2 MG tablet Commonly known as: ABILIFY Take 2 mg by mouth at bedtime.   buPROPion 150 MG 24 hr tablet Commonly known as: WELLBUTRIN XL Take 150 mg by mouth daily.   desvenlafaxine 50 MG 24 hr tablet Commonly known as: PRISTIQ Take 50 mg by mouth at bedtime.   Desvenlafaxine Succinate ER 25 MG Tb24 Take 1 tablet by mouth daily.   furosemide 20 MG tablet Commonly known as: LASIX Take 40 mg by mouth 2 (two) times daily.   gabapentin 600 MG tablet Commonly known as: NEURONTIN Take 600 mg by mouth 3 (three) times daily.   HumaLOG 100 UNIT/ML injection Generic drug: insulin lispro Inject 3.5 Units into the skin continuous. 3.5 units every 1 hr-insulin pump   ibuprofen 200 MG tablet Commonly known as: ADVIL Take 400 mg by mouth every 6 (six) hours as needed for headache or moderate pain.   multivitamin with minerals Tabs tablet Take 1 tablet by mouth daily.   omeprazole 40 MG capsule Commonly known as: PRILOSEC Take 40 mg by mouth daily.   ondansetron 4 MG  tablet Commonly known as: ZOFRAN Take 1 tablet (4 mg total) by mouth every 8 (eight) hours as needed.   oxybutynin 5 MG tablet Commonly known as: DITROPAN Take 5 mg by mouth at bedtime.   oxyCODONE-acetaminophen 5-325 MG tablet Commonly known as: Percocet Take 1 tablet by mouth every 4 (four) hours as needed for severe pain.   pantoprazole 40 MG tablet Commonly known as: PROTONIX Take 1 tablet (40 mg total) by mouth 2 (two) times daily.   Potassium 99 MG Tabs Take 99 mg by mouth daily.   simvastatin 40 MG tablet Commonly known as: ZOCOR Take 40 mg by mouth daily.   tamsulosin 0.4 MG Caps capsule Commonly known as: Flomax Take 1 capsule (0.4 mg total) by mouth daily after supper.       Allergies:  Allergies  Allergen Reactions  . Ciprofloxacin Hives and Swelling  . Penicillins Anaphylaxis and Rash  . Codeine Other (See Comments)    had hallicunations  . Morphine Nausea And Vomiting and Other (See Comments)  recieved in ED due to [facility] and had multple doses - gave visual hallucinations and vomitting.  . Sulfonamide Derivatives Other (See Comments)    REACTION: Unsure - childhood allergy  . Vancomycin Itching    Family History: Family History  Problem Relation Age of Onset  . Diabetes Mother   . Depression Paternal Grandmother   . Depression Paternal Grandfather   . Heart disease Other   . Cancer Other   . Diabetes Other   . Achalasia Other   . Anesthesia problems Neg Hx   . Hypotension Neg Hx   . Malignant hyperthermia Neg Hx   . Pseudochol deficiency Neg Hx     Social History:  reports that she has never smoked. She has never used smokeless tobacco. She reports that she does not drink alcohol and does not use drugs.  ROS: All other review of systems were reviewed and are negative except what is noted above in HPI  Physical Exam: BP 103/68   Pulse 80   Temp 98.3 F (36.8 C)   LMP 07/16/2012   Constitutional:  Alert and oriented, No acute  distress. HEENT: [place] AT, moist mucus membranes.  Trachea midline, no masses. Cardiovascular: No clubbing, cyanosis, or edema. Respiratory: Normal respiratory effort, no increased work of breathing. GI: Abdomen is soft, nontender, nondistended, no abdominal masses GU: No CVA tenderness.  Lymph: No cervical or inguinal lymphadenopathy. Skin: No rashes, bruises or suspicious lesions. Neurologic: Grossly intact, no focal deficits, moving all 4 extremities. Psychiatric: Normal mood and affect.  Laboratory Data: Lab Results  Component Value Date   WBC 11.4 (H) 05/02/2020   HGB 12.6 05/04/2020   HCT 37.0 05/04/2020   MCV 86.4 05/02/2020   PLT 384 05/02/2020    Lab Results  Component Value Date   CREATININE 0.76 08/05/2020    No results found for: PSA  No results found for: TESTOSTERONE  Lab Results  Component Value Date   HGBA1C 7.3 (H) 08/05/2020    Urinalysis    Component Value Date/Time   COLORURINE AMBER (A) 04/04/2020 0309   APPEARANCEUR Clear 09/29/2020 0936   LABSPEC 1.020 04/04/2020 0309   PHURINE 6.0 04/04/2020 0309   GLUCOSEU Negative 09/29/2020 0936   HGBUR LARGE (A) 04/04/2020 0309   HGBUR moderate 08/31/2008 0908   BILIRUBINUR Negative 09/29/2020 0936   KETONESUR NEGATIVE 04/04/2020 0309   PROTEINUR Negative 09/29/2020 0936   PROTEINUR 100 (A) 04/04/2020 0309   UROBILINOGEN 0.2 05/25/2020 1555   UROBILINOGEN 0.2 09/21/2014 1055   NITRITE Negative 09/29/2020 0936   NITRITE NEGATIVE 04/04/2020 0309   LEUKOCYTESUR 1+ (A) 09/29/2020 0936   LEUKOCYTESUR SMALL (A) 04/04/2020 0309    Lab Results  Component Value Date   LABMICR See below: 09/29/2020   WBCUA 6-10 (A) 09/29/2020   LABEPIT 0-10 09/29/2020   BACTERIA Few 09/29/2020    Pertinent Imaging: KUb today: Images reviewed and discussed with the patient Results for orders placed during the hospital encounter of 10/11/20  DG Abd 1 View  Narrative CLINICAL DATA:  Pre lithotripsy  EXAM: ABDOMEN  - 1 VIEW  COMPARISON:  09/29/2020  FINDINGS: Right lower pole renal stones are again noted, stable. No additional visualized stones. Prior cholecystectomy. Nonobstructive bowel gas pattern. Moderate stool in the colon.  IMPRESSION: Right lower pole nephrolithiasis, stable.   Electronically Signed By: Charlett Nose M.D. On: 10/12/2020 03:14  No results found for this or any previous visit.  No results found for this or any previous visit.  No  results found for this or any previous visit.  Results for orders placed during the hospital encounter of 09/14/20  Ultrasound renal complete  Narrative CLINICAL DATA:  Nephrolithiasis  EXAM: RENAL / URINARY TRACT ULTRASOUND COMPLETE  COMPARISON:  06/30/2020, CT 04/04/2020  FINDINGS: Right Kidney:  Renal measurements: 11.2 x 5.7 x 6.4 cm = volume: 213 mL. Echogenicity within normal limits. No hydronephrosis. Small echogenic stone at the lower pole measuring 8 mm.  Left Kidney:  Renal measurements: 11.7 x 5.3 x 6.4 cm = volume: 208 mL. Echogenicity within normal limits. No hydronephrosis. Echogenic shadowing stone within the mid to upper left kidney is not clearly identified today.  Bladder:  Appears normal for degree of bladder distention.  Other:  None.  IMPRESSION: 1. Probable 8 mm stone in the right kidney 2. Previously noted 2.8 cm echogenic area with shadowing/probable stone in the mid to upper left kidney is not identified on today's study.   Electronically Signed By: Jasmine Pang M.D. On: 09/14/2020 23:24  No results found for this or any previous visit.  No results found for this or any previous visit.  Results for orders placed during the hospital encounter of 04/04/20  CT RENAL STONE STUDY  Narrative CLINICAL DATA:  Left flank pain and hematuria  EXAM: CT ABDOMEN AND PELVIS WITHOUT CONTRAST  TECHNIQUE: Multidetector CT imaging of the abdomen and pelvis was performed following the  standard protocol without IV contrast.  COMPARISON:  February 25, 2020  FINDINGS: Lower chest: The visualized heart size within normal limits. No pericardial fluid/thickening.  No hiatal hernia.  The visualized portions of the lungs are clear.  Hepatobiliary: Although limited due to the lack of intravenous contrast, normal in appearance without gross focal abnormality. The patient is status post cholecystectomy. No biliary ductal dilation.  Pancreas:  Unremarkable.  No surrounding inflammatory changes.  Spleen: Normal in size. Although limited due to the lack of intravenous contrast, normal in appearance.  Adrenals/Urinary Tract: Both adrenal glands appear normal. There is been interval placement a right-sided double-J ureteral catheter. There is a clustered 1 cm calculus seen within the lower pole of the right kidney. There appears to be mild stranding changes seen around the right ureter extending to the UVJ. There remains mild right ureterectasis present. There is ureterectasis. Again noted are multiple clustered calcifications within the upper pole left kidney extending into the renal pelvis measuring 1.7 cm. There is tiny punctate calcification pole the left kidney measuring 3 mm. No left-sided hydronephrosis is seen.  Stomach/Bowel: The stomach, small bowel, and colon are normal in appearance. No inflammatory changes or obstructive findings. appendix is normal.  Vascular/Lymphatic: There are no enlarged abdominal or pelvic lymph nodes. No significant gross vascular findings are present.  Reproductive: The uterus and adnexa are unremarkable.  Other: No evidence of abdominal wall mass or hernia.  Musculoskeletal: No acute or significant osseous findings.  IMPRESSION: 1. Interval placement right-sided double-J ureteral stent with mild residual ureterectasis. There is also periureteral fat stranding changes seen to the level of the UVJ which could be due to  mild ureteritis. 2. Unchanged bilateral renal calculi   Electronically Signed By: Jonna Clark M.D. On: 04/04/2020 02:13   Assessment & Plan:    1. Ureterolithiasis -We discussed the management of kidney stones. These options include observation, ureteroscopy, shockwave lithotripsy (ESWL) and percutaneous nephrolithotomy (PCNL). We discussed which options are relevant to the patient's stone(s). We discussed the natural history of kidney stones as well as the complications of  untreated stones and the impact on quality of life without treatment as well as with each of the above listed treatments. We also discussed the efficacy of each treatment in its ability to clear the stone burden. With any of these management options I discussed the signs and symptoms of infection and the need for emergent treatment should these be experienced. For each option we discussed the ability of each procedure to clear the patient of their stone burden.   For observation I described the risks which include but are not limited to silent renal damage, life-threatening infection, need for emergent surgery, failure to pass stone and pain.   For ureteroscopy I described the risks which include bleeding, infection, damage to contiguous structures, positioning injury, ureteral stricture, ureteral avulsion, ureteral injury, need for prolonged ureteral stent, inability to perform ureteroscopy, need for an interval procedure, inability to clear stone burden, stent discomfort/pain, heart attack, stroke, pulmonary embolus and the inherent risks with general anesthesia.   For shockwave lithotripsy I described the risks which include arrhythmia, kidney contusion, kidney hemorrhage, need for transfusion, pain, inability to adequately break up stone, inability to pass stone fragments, Steinstrasse, infection associated with obstructing stones, need for alternate surgical procedure, need for repeat shockwave lithotripsy, MI, CVA, PE  and the inherent risks with anesthesia/conscious sedation.   For PCNL I described the risks including positioning injury, pneumothorax, hydrothorax, need for chest tube, inability to clear stone burden, renal laceration, arterial venous fistula or malformation, need for embolization of kidney, loss of kidney or renal function, need for repeat procedure, need for prolonged nephrostomy tube, ureteral avulsion, MI, CVA, PE and the inherent risks of general anesthesia.   - The patient would like to proceed with right ureteroscopic stone extraction  2. Dorsalgia: -flexeril 5mg  prn   No follow-ups on file.  , MD  [facility] [place]

## 2020-11-01 NOTE — Discharge Instructions (Signed)
    [name]  11/01/2020      [id number] - [place], [place] - [name] [place] [address] Phone: [phone] Fax: [phone]    Your procedure is scheduled on  11/08/2020  Report to [facility] at  0845  A.M.  Call this number if you have problems the morning of surgery:  [phone]   Remember:  Do not eat or drink after midnight.                     Take these medicines the morning of surgery with A SIP OF WATER  Wellbutrin, flexeril, desvenlafaxine, gabapentin, prilosec, zofran(if needed), oxycodone(if needed), protonix. Dial insulin pump down to basal at midnight and bring extra  supplies in case your pump becomes disconnected. Use your inhaler before you come.     Do not wear jewelry, make-up or nail polish.  Do not wear lotions, powders, or perfumes. Please wear deodorant and brush your teeth.  Do not shave 48 hours prior to surgery.  Men may shave face and neck.  Do not bring valuables to the hospital.  [facility] is not responsible for any belongings or valuables.  Contacts, dentures or bridgework may not be worn into surgery.  Leave your suitcase in the car.  After surgery it may be brought to your room.  For patients admitted to the hospital, discharge time will be determined by your treatment team.  Patients discharged the day of surgery will not be allowed to drive home.   Name and phone number of your driver:   family Special instructions:   DO NOT smoke the morning of your procedure.  Please read over the following fact sheets that you were given. Anesthesia Post-op Instructions and Care and Recovery After Surgery

## 2020-11-02 ENCOUNTER — Other Ambulatory Visit (HOSPITAL_COMMUNITY): Payer: Commercial Managed Care - PPO

## 2020-11-07 ENCOUNTER — Other Ambulatory Visit: Payer: Self-pay | Admitting: Urology

## 2020-11-07 ENCOUNTER — Other Ambulatory Visit (HOSPITAL_COMMUNITY)
Admission: RE | Admit: 2020-11-07 | Discharge: 2020-11-07 | Disposition: A | Discharge status: Home / Self Care | Payer: Commercial Managed Care - PPO | Source: Ambulatory Visit | Attending: Urology | Admitting: Urology

## 2020-11-07 ENCOUNTER — Encounter (HOSPITAL_COMMUNITY): Payer: Self-pay | Admitting: Urology

## 2020-11-07 ENCOUNTER — Other Ambulatory Visit: Payer: Self-pay

## 2020-11-07 ENCOUNTER — Encounter (HOSPITAL_COMMUNITY)
Admission: RE | Admit: 2020-11-07 | Discharge: 2020-11-07 | Disposition: A | Discharge status: Home / Self Care | Payer: Commercial Managed Care - PPO | Source: Ambulatory Visit | Attending: Urology | Admitting: Urology

## 2020-11-07 DIAGNOSIS — Z881 Allergy status to other antibiotic agents status: Secondary | ICD-10-CM | POA: Diagnosis not present

## 2020-11-07 DIAGNOSIS — Z01812 Encounter for preprocedural laboratory examination: Secondary | ICD-10-CM | POA: Insufficient documentation

## 2020-11-07 DIAGNOSIS — Z9641 Presence of insulin pump (external) (internal): Secondary | ICD-10-CM | POA: Diagnosis not present

## 2020-11-07 DIAGNOSIS — N200 Calculus of kidney: Secondary | ICD-10-CM | POA: Diagnosis present

## 2020-11-07 DIAGNOSIS — Z888 Allergy status to other drugs, medicaments and biological substances status: Secondary | ICD-10-CM | POA: Diagnosis not present

## 2020-11-07 DIAGNOSIS — Z885 Allergy status to narcotic agent status: Secondary | ICD-10-CM | POA: Diagnosis not present

## 2020-11-07 DIAGNOSIS — Z882 Allergy status to sulfonamides status: Secondary | ICD-10-CM | POA: Diagnosis not present

## 2020-11-07 DIAGNOSIS — Z20822 Contact with and (suspected) exposure to covid-19: Secondary | ICD-10-CM | POA: Insufficient documentation

## 2020-11-07 DIAGNOSIS — Z880 Allergy status to penicillin: Secondary | ICD-10-CM | POA: Diagnosis not present

## 2020-11-07 LAB — BASIC METABOLIC PANEL
Anion gap: 10 (ref 5–15)
BUN: 18 mg/dL (ref 6–20)
CO2: 28 mmol/L (ref 22–32)
Calcium: 8.9 mg/dL (ref 8.9–10.3)
Chloride: 100 mmol/L (ref 98–111)
Creatinine, Ser: 0.76 mg/dL (ref 0.44–1.00)
GFR, Estimated: >60 mL/min (ref >60)
Glucose, Bld: 125 mg/dL — ABNORMAL HIGH (ref 70–99)
Potassium: 3.5 mmol/L (ref 3.5–5.1)
Sodium: 138 mmol/L (ref 135–145)

## 2020-11-07 LAB — SARS CORONAVIRUS 2 (TAT 6-24 HRS): SARS Coronavirus 2: NEGATIVE

## 2020-11-07 LAB — HEMOGLOBIN A1C
Hgb A1c MFr Bld: 7.6 % — ABNORMAL HIGH (ref 4.8–5.6)
Mean Plasma Glucose: 171.42 mg/dL

## 2020-11-08 ENCOUNTER — Ambulatory Visit (HOSPITAL_COMMUNITY): Payer: Commercial Managed Care - PPO | Admitting: Certified Registered"

## 2020-11-08 ENCOUNTER — Ambulatory Visit (HOSPITAL_COMMUNITY)
Admission: RE | Admit: 2020-11-08 | Discharge: 2020-11-08 | Disposition: A | Discharge status: Home / Self Care | Payer: Commercial Managed Care - PPO | Attending: Urology | Admitting: Urology

## 2020-11-08 ENCOUNTER — Other Ambulatory Visit: Payer: Self-pay

## 2020-11-08 ENCOUNTER — Encounter (HOSPITAL_COMMUNITY)
Admission: RE | Disposition: A | Discharge status: Home / Self Care | Payer: Self-pay | Source: Home / Self Care | Attending: Urology

## 2020-11-08 ENCOUNTER — Ambulatory Visit (HOSPITAL_COMMUNITY): Payer: Commercial Managed Care - PPO

## 2020-11-08 ENCOUNTER — Encounter (HOSPITAL_COMMUNITY): Payer: Self-pay | Admitting: Urology

## 2020-11-08 DIAGNOSIS — Z888 Allergy status to other drugs, medicaments and biological substances status: Secondary | ICD-10-CM | POA: Insufficient documentation

## 2020-11-08 DIAGNOSIS — N200 Calculus of kidney: Secondary | ICD-10-CM

## 2020-11-08 DIAGNOSIS — Z880 Allergy status to penicillin: Secondary | ICD-10-CM | POA: Insufficient documentation

## 2020-11-08 DIAGNOSIS — Z882 Allergy status to sulfonamides status: Secondary | ICD-10-CM | POA: Insufficient documentation

## 2020-11-08 DIAGNOSIS — N201 Calculus of ureter: Secondary | ICD-10-CM

## 2020-11-08 DIAGNOSIS — Z881 Allergy status to other antibiotic agents status: Secondary | ICD-10-CM | POA: Insufficient documentation

## 2020-11-08 DIAGNOSIS — Z9641 Presence of insulin pump (external) (internal): Secondary | ICD-10-CM | POA: Insufficient documentation

## 2020-11-08 DIAGNOSIS — Z885 Allergy status to narcotic agent status: Secondary | ICD-10-CM | POA: Insufficient documentation

## 2020-11-08 HISTORY — PX: STONE EXTRACTION WITH BASKET: SHX5318

## 2020-11-08 HISTORY — PX: CYSTOSCOPY WITH RETROGRADE PYELOGRAM, URETEROSCOPY AND STENT PLACEMENT: SHX5789

## 2020-11-08 LAB — GLUCOSE, CAPILLARY
Glucose-Capillary: 88 mg/dL (ref 70–99)
Glucose-Capillary: 126 mg/dL — ABNORMAL HIGH (ref 70–99)

## 2020-11-08 SURGERY — CYSTOURETEROSCOPY, WITH RETROGRADE PYELOGRAM AND STENT INSERTION
Anesthesia: General | Site: Renal | Laterality: Right

## 2020-11-08 MED ORDER — ONDANSETRON HCL 4 MG/2ML IJ SOLN
INTRAMUSCULAR | Status: DC | PRN
  Administered 2020-11-08: 4 mg via INTRAVENOUS

## 2020-11-08 MED ORDER — DIATRIZOATE MEGLUMINE 30 % UR SOLN
URETHRAL | Status: DC | PRN
  Administered 2020-11-08: 10 mL

## 2020-11-08 MED ORDER — ONDANSETRON HCL 4 MG/2ML IJ SOLN: 4.0000 mg | Freq: Once | INTRAMUSCULAR | Status: DC | PRN

## 2020-11-08 MED ORDER — GENTAMICIN SULFATE 40 MG/ML IJ SOLN
320.0000 mg | Freq: Once | INTRAVENOUS | Status: AC
  Administered 2020-11-08: 320 mg via INTRAVENOUS
  Filled 2020-11-08: qty 8

## 2020-11-08 MED ORDER — SUCCINYLCHOLINE CHLORIDE 20 MG/ML IJ SOLN
INTRAMUSCULAR | Status: DC | PRN
  Administered 2020-11-08: 200 mg via INTRAVENOUS

## 2020-11-08 MED ORDER — DEXTROSE 50 % IV SOLN
25.0000 mL | Freq: Once | INTRAVENOUS | Status: AC
  Administered 2020-11-08: 25 mL via INTRAVENOUS

## 2020-11-08 MED ORDER — FENTANYL CITRATE (PF) 100 MCG/2ML IJ SOLN
INTRAMUSCULAR | Status: AC
  Filled 2020-11-08: qty 2

## 2020-11-08 MED ORDER — OXYCODONE-ACETAMINOPHEN 5-325 MG PO TABS: 1.0000 | ORAL_TABLET | ORAL | 0 refills | Status: DC | PRN

## 2020-11-08 MED ORDER — CEFAZOLIN SODIUM-DEXTROSE 2-4 GM/100ML-% IV SOLN: 2.0000 g | INTRAVENOUS | Status: DC

## 2020-11-08 MED ORDER — ONDANSETRON HCL 4 MG/2ML IJ SOLN
INTRAMUSCULAR | Status: AC
  Filled 2020-11-08: qty 2

## 2020-11-08 MED ORDER — FENTANYL CITRATE (PF) 100 MCG/2ML IJ SOLN
INTRAMUSCULAR | Status: DC | PRN
  Administered 2020-11-08: 100 ug via INTRAVENOUS

## 2020-11-08 MED ORDER — SUCCINYLCHOLINE CHLORIDE 200 MG/10ML IV SOSY
PREFILLED_SYRINGE | INTRAVENOUS | Status: AC
  Filled 2020-11-08: qty 10

## 2020-11-08 MED ORDER — ROCURONIUM BROMIDE 10 MG/ML (PF) SYRINGE
PREFILLED_SYRINGE | INTRAVENOUS | Status: AC
  Filled 2020-11-08: qty 10

## 2020-11-08 MED ORDER — LIDOCAINE HCL (PF) 2 % IJ SOLN
INTRAMUSCULAR | Status: AC
  Filled 2020-11-08: qty 5

## 2020-11-08 MED ORDER — DEXTROSE 50 % IV SOLN
INTRAVENOUS | Status: AC
  Filled 2020-11-08: qty 50

## 2020-11-08 MED ORDER — SEVOFLURANE IN SOLN
RESPIRATORY_TRACT | Status: AC
  Filled 2020-11-08: qty 250

## 2020-11-08 MED ORDER — SODIUM CHLORIDE 0.9 % IR SOLN
Status: DC | PRN
  Administered 2020-11-08 (×2): 3000 mL

## 2020-11-08 MED ORDER — CHLORHEXIDINE GLUCONATE 0.12 % MT SOLN
15.0000 mL | Freq: Once | OROMUCOSAL | Status: AC
  Administered 2020-11-08: 15 mL via OROMUCOSAL

## 2020-11-08 MED ORDER — PHENYLEPHRINE HCL (PRESSORS) 10 MG/ML IV SOLN
INTRAVENOUS | Status: DC | PRN
  Administered 2020-11-08 (×2): 80 ug via INTRAVENOUS
  Administered 2020-11-08: 40 ug via INTRAVENOUS

## 2020-11-08 MED ORDER — DIATRIZOATE MEGLUMINE 30 % UR SOLN
URETHRAL | Status: AC
  Filled 2020-11-08: qty 100

## 2020-11-08 MED ORDER — EPHEDRINE SULFATE 50 MG/ML IJ SOLN
INTRAMUSCULAR | Status: DC | PRN
  Administered 2020-11-08: 10 mg via INTRAVENOUS

## 2020-11-08 MED ORDER — PROPOFOL 10 MG/ML IV BOLUS
INTRAVENOUS | Status: DC | PRN
  Administered 2020-11-08: 200 mg via INTRAVENOUS

## 2020-11-08 MED ORDER — SUGAMMADEX SODIUM 200 MG/2ML IV SOLN
INTRAVENOUS | Status: DC | PRN
  Administered 2020-11-08: 200 mg via INTRAVENOUS

## 2020-11-08 MED ORDER — CEFAZOLIN SODIUM-DEXTROSE 2-4 GM/100ML-% IV SOLN
INTRAVENOUS | Status: AC
  Filled 2020-11-08: qty 100

## 2020-11-08 MED ORDER — ROCURONIUM BROMIDE 10 MG/ML (PF) SYRINGE
PREFILLED_SYRINGE | INTRAVENOUS | Status: DC | PRN
  Administered 2020-11-08: 20 mg via INTRAVENOUS
  Administered 2020-11-08: 10 mg via INTRAVENOUS

## 2020-11-08 MED ORDER — TAMSULOSIN HCL 0.4 MG PO CAPS
ORAL_CAPSULE | ORAL | 0 refills | Status: DC
Start: 2020-11-08 — End: 2021-03-16

## 2020-11-08 MED ORDER — FENTANYL CITRATE (PF) 100 MCG/2ML IJ SOLN
25.0000 ug | INTRAMUSCULAR | Status: DC | PRN
  Administered 2020-11-08 (×2): 50 ug via INTRAVENOUS
  Filled 2020-11-08: qty 2

## 2020-11-08 MED ORDER — LACTATED RINGERS IV SOLN: INTRAVENOUS | Status: DC

## 2020-11-08 MED ORDER — PHENYLEPHRINE 40 MCG/ML (10ML) SYRINGE FOR IV PUSH (FOR BLOOD PRESSURE SUPPORT)
PREFILLED_SYRINGE | INTRAVENOUS | Status: AC
  Filled 2020-11-08: qty 10

## 2020-11-08 MED ORDER — ORAL CARE MOUTH RINSE: 15.0000 mL | Freq: Once | OROMUCOSAL | Status: AC

## 2020-11-08 MED ORDER — EPHEDRINE 5 MG/ML INJ
INTRAVENOUS | Status: AC
  Filled 2020-11-08: qty 10

## 2020-11-08 MED ORDER — STERILE WATER FOR IRRIGATION IR SOLN
Status: DC | PRN
  Administered 2020-11-08: 1000 mL

## 2020-11-08 SURGICAL SUPPLY — 25 items
BAG DRAIN URO TABLE W/ADPT NS (BAG) ×3 IMPLANT
BAG DRN 8 ADPR NS SKTRN CSTL (BAG) ×2
BAG HAMPER (MISCELLANEOUS) ×3 IMPLANT
CATH INTERMIT  6FR 70CM (CATHETERS) ×3 IMPLANT
CLOTH BEACON ORANGE TIMEOUT ST (SAFETY) ×3 IMPLANT
DECANTER SPIKE VIAL GLASS SM (MISCELLANEOUS) ×3 IMPLANT
EXTRACTOR STONE NITINOL NGAGE (UROLOGICAL SUPPLIES) ×1 IMPLANT
GLOVE BIO SURGEON STRL SZ8 (GLOVE) ×3 IMPLANT
GLOVE BIOGEL PI IND STRL 7.0 (GLOVE) ×4 IMPLANT
GLOVE BIOGEL PI INDICATOR 7.0 (GLOVE) ×2
GLOVE ECLIPSE 6.5 STRL STRAW (GLOVE) ×1 IMPLANT
GOWN STRL REUS W/TWL LRG LVL3 (GOWN DISPOSABLE) ×3 IMPLANT
GOWN STRL REUS W/TWL XL LVL3 (GOWN DISPOSABLE) ×3 IMPLANT
GUIDEWIRE STR DUAL SENSOR (WIRE) ×1 IMPLANT
GUIDEWIRE STR ZIPWIRE 035X150 (MISCELLANEOUS) ×3 IMPLANT
IV NS IRRIG 3000ML ARTHROMATIC (IV SOLUTION) ×4 IMPLANT
KIT TURNOVER CYSTO (KITS) ×3 IMPLANT
MANIFOLD NEPTUNE II (INSTRUMENTS) ×3 IMPLANT
PACK CYSTO (CUSTOM PROCEDURE TRAY) ×3 IMPLANT
PAD ARMBOARD 7.5X6 YLW CONV (MISCELLANEOUS) ×3 IMPLANT
SHEATH URETERAL 12FRX35CM (MISCELLANEOUS) ×1 IMPLANT
STENT URET 6FRX24 CONTOUR (STENTS) ×1 IMPLANT
SYR 10ML LL (SYRINGE) ×3 IMPLANT
TOWEL OR 17X26 4PK STRL BLUE (TOWEL DISPOSABLE) ×3 IMPLANT
WATER STERILE IRR 500ML POUR (IV SOLUTION) ×3 IMPLANT

## 2020-11-08 NOTE — Anesthesia Preprocedure Evaluation (Signed)
Anesthesia Evaluation  Patient identified by MRN, date of birth, ID band Patient awake    Reviewed: Allergy & Precautions, H&P , NPO status , Patient's Chart, lab work & pertinent test results, reviewed documented beta blocker date and time   Airway Mallampati: II  TM Distance: >3 FB Neck ROM: full    Dental no notable dental hx.    Pulmonary shortness of breath, sleep apnea ,    Pulmonary exam normal breath sounds clear to auscultation       Cardiovascular Exercise Tolerance: Good hypertension, negative cardio ROS   Rhythm:regular Rate:Normal     Neuro/Psych  Headaches, PSYCHIATRIC DISORDERS Depression    GI/Hepatic Neg liver ROS, GERD  Medicated,  Endo/Other  diabetesMorbid obesity  Renal/GU Renal disease  negative genitourinary   Musculoskeletal   Abdominal   Peds  Hematology negative hematology ROS (+)   Anesthesia Other Findings   Reproductive/Obstetrics negative OB ROS                             Anesthesia Physical  Anesthesia Plan  ASA: III  Anesthesia Plan: General   Post-op Pain Management:    Induction:   PONV Risk Score and Plan: Ondansetron  Airway Management Planned:   Additional Equipment:   Intra-op Plan:   Post-operative Plan:   Informed Consent: I have reviewed the patients History and Physical, chart, labs and discussed the procedure including the risks, benefits and alternatives for the proposed anesthesia with the patient or authorized representative who has indicated his/her understanding and acceptance.     Dental Advisory Given  Plan Discussed with: CRNA  Anesthesia Plan Comments:         Anesthesia Quick Evaluation  

## 2020-11-08 NOTE — Anesthesia Procedure Notes (Signed)
Procedure Name: Intubation Performed by: Brynda Peon, CRNA Pre-anesthesia Checklist: Patient identified, Emergency Drugs available, Suction available, Patient being monitored and Timeout performed Patient Re-evaluated:Patient Re-evaluated prior to induction Oxygen Delivery Method: Circle system utilized Preoxygenation: Pre-oxygenation with 100% oxygen Induction Type: IV induction Laryngoscope Size: Miller and 2 Grade View: Grade III Tube size: 6.5 mm Number of attempts: 1 Airway Equipment and Method: Stylet Placement Confirmation: ETT inserted through vocal cords under direct vision,  positive ETCO2,  CO2 detector and breath sounds checked- equal and bilateral Secured at: 21 cm Tube secured with: Tape Dental Injury: Teeth and Oropharynx as per pre-operative assessment

## 2020-11-08 NOTE — Op Note (Signed)
.  Preoperative diagnosis: Right renal stone  Postoperative diagnosis: Same  Procedure: 1 cystoscopy 2.  Right retrograde pyelography 3.  Intraoperative fluoroscopy, under one hour, with interpretation 4.  Right ureteroscopic stone manipulation with basket extraction 5.  Right 6 x 24 JJ stent placement  Attending: Wilkie Aye  Anesthesia: General  Estimated blood loss: None  Drains: Right 6 x 26 JJ ureteral stent with tether  Specimens: stone for analysis  Antibiotics: gentamicin  Findings: numerous right lower pole calculi/fragments from ESWL. No hydronephrosis. No masses/lesions in the bladder. Ureteral orifices in normal anatomic location.  Indications: Patient is a 54 year old female with a history of right renal stones who underwent ESWL and who has persistent Right flank pain.  After discussing treatment options, she decided proceed with right ureteroscopic stone manipulation.  Procedure her in detail: The patient was brought to the operating room and a brief timeout was done to ensure correct patient, correct procedure, correct site.  General anesthesia was administered patient was placed in dorsal lithotomy position.  Her genitalia was then prepped and draped in usual sterile fashion.  A rigid 22 French cystoscope was passed in the urethra and the bladder.  Bladder was inspected free masses or lesions.  the ureteral orifices were in the normal orthotopic locations.  a 6 french ureteral catheter was then instilled into the right ureteral orifice.  a gentle retrograde was obtained and findings noted above.  we then placed a zip wire through the ureteral catheter and advanced up to the renal pelvis.  we then removed the cystoscope and cannulated the left ureteral orifice with a semirigid ureteroscope.  No stone was found in the ureter. Once we reached the UPJ a sensor wire was advanced in to the renal pelvis. We then removed the ureteroscope and advanced am 12/14 x 35cm access  sheath up to the renal pelvis. We then used the flexible ureteroscope to perform nephroscopy. We encountered the numerous stone in the lower pole.    the pieces were then removed with a Ngage basket.    once all stone fragments were removed we then removed the access sheath under direct vision and noted no injury to the ureter. We then placed a 6 x 24 double-j ureteral stent over the original zip wire.  We then removed the wire and good coil was noted in the the renal pelvis under fluoroscopy and the bladder under direct vision. the bladder was then drained and this concluded the procedure which was well tolerated by patient.  Complications: None  Condition: Stable, extubated, transferred to PACU  Plan: Patient is to be discharged home as to follow-up in one week. She is to remove her stent in 72 hours by pulling the tether

## 2020-11-08 NOTE — Progress Notes (Signed)
Patient reconnected her insulin pump before discharge.  Pump intact and working.

## 2020-11-08 NOTE — Anesthesia Postprocedure Evaluation (Signed)
Anesthesia Post Note  Patient: [name]  Procedure(s) Performed: CYSTOSCOPY WITH RIGHT RETROGRADE PYELOGRAM, RIGHT URETEROSCOPY AND STENT PLACEMENT (Right ) STONE EXTRACTION WITH BASKET (Right Renal)  Patient location during evaluation: PACU Anesthesia Type: General Level of consciousness: awake and alert and oriented Pain management: pain level controlled (Currently receiving IV Fentanyl for pain  ) Vital Signs Assessment: post-procedure vital signs reviewed and stable Respiratory status: spontaneous breathing, respiratory function stable and nonlabored ventilation Cardiovascular status: stable and blood pressure returned to baseline Postop Assessment: no apparent nausea or vomiting Anesthetic complications: no   No complications documented.   Last Vitals:  Vitals:   11/08/20 0929 11/08/20 1151  BP: 135/71   Pulse: 78   Resp: 18   Temp: 36.8 C 36.4 C  SpO2: 98%     Last Pain:  Vitals:   11/08/20 1151  TempSrc:   PainSc: Asleep                 Julian Reil

## 2020-11-08 NOTE — Transfer of Care (Signed)
Immediate Anesthesia Transfer of Care Note  Patient: [name]  Procedure(s) Performed: CYSTOSCOPY WITH RIGHT RETROGRADE PYELOGRAM, RIGHT URETEROSCOPY AND STENT PLACEMENT (Right ) STONE EXTRACTION WITH BASKET (Right Renal)  Patient Location: PACU  Anesthesia Type:General  Level of Consciousness: awake and alert   Airway & Oxygen Therapy: Patient Spontanous Breathing and Patient connected to face mask oxygen  Post-op Assessment: Report given to RN and Post -op Vital signs reviewed and stable  Post vital signs: Reviewed and stable  Last Vitals:  Vitals Value Taken Time  BP 141/73 11/08/20 1151  Temp    Pulse 89 11/08/20 1154  Resp 15 11/08/20 1154  SpO2 84 % 11/08/20 1154  Vitals shown include unvalidated device data.  Last Pain:  Vitals:   11/08/20 0929  TempSrc: Oral  PainSc: 9       Patients Stated Pain Goal: 3 (11/08/20 0929)  Complications: No complications documented.

## 2020-11-08 NOTE — Discharge Instructions (Signed)
Ureteral Stent Implantation, Care After This sheet gives you information about how to care for yourself after your procedure. Your health care provider may also give you more specific instructions. If you have problems or questions, contact your health care provider. What can I expect after the procedure? After the procedure, it is common to have:  Nausea.  Mild pain when you urinate. You may feel this pain in your lower back or lower abdomen. The pain should stop within a few minutes after you urinate. This may last for up to 1 week.  A small amount of blood in your urine for several days. Follow these instructions at home: Medicines  Take over-the-counter and prescription medicines only as told by your health care provider.  If you were prescribed an antibiotic medicine, take it as told by your health care provider. Do not stop taking the antibiotic even if you start to feel better.  Do not drive for 24 hours if you were given a sedative during your procedure.  Ask your health care provider if the medicine prescribed to you requires you to avoid driving or using heavy machinery. Activity  Rest as told by your health care provider.  Avoid sitting for a long time without moving. Get up to take short walks every 1-2 hours. This is important to improve blood flow and breathing. Ask for help if you feel weak or unsteady.  Return to your normal activities as told by your health care provider. Ask your health care provider what activities are safe for you. General instructions   Watch for any blood in your urine. Call your health care provider if the amount of blood in your urine increases.  If you have a catheter: ? Follow instructions from your health care provider about taking care of your catheter and collection bag. ? Do not take baths, swim, or use a hot tub until your health care provider approves. Ask your health care provider if you may take showers. You may only be allowed to  take sponge baths.  Drink enough fluid to keep your urine pale yellow.  Do not use any products that contain nicotine or tobacco, such as cigarettes, e-cigarettes, and chewing tobacco. These can delay healing after surgery. If you need help quitting, ask your health care provider.  Keep all follow-up visits as told by your health care provider. This is important. Contact a health care provider if:  You have pain that gets worse or does not get better with medicine, especially pain when you urinate.  You have difficulty urinating.  You feel nauseous or you vomit repeatedly during a period of more than 2 days after the procedure. Get help right away if:  Your urine is dark red or has blood clots in it.  You are leaking urine (have incontinence).  The end of the stent comes out of your urethra.  You cannot urinate.  You have sudden, sharp, or severe pain in your abdomen or lower back.  You have a fever.  You have swelling or pain in your legs.  You have difficulty breathing. Summary  After the procedure, it is common to have mild pain when you urinate that goes away within a few minutes after you urinate. This may last for up to 1 week.  Watch for any blood in your urine. Call your health care provider if the amount of blood in your urine increases.  Take over-the-counter and prescription medicines only as told by your health care provider.  Drink   enough fluid to keep your urine pale yellow. This information is not intended to replace advice given to you by your health care provider. Make sure you discuss any questions you have with your health care provider. Document Revised: 09/02/2018 Document Reviewed: 09/03/2018 Elsevier Patient Education  2020 Elsevier Inc.  PLEASE REMOVE YOUR STENT IN 72 HOURS BY GENTLY PULLING THE STRING      General Anesthesia, Adult, Care After This sheet gives you information about how to care for yourself after your procedure. Your health  care provider may also give you more specific instructions. If you have problems or questions, contact your health care provider. What can I expect after the procedure? After the procedure, the following side effects are common:  Pain or discomfort at the IV site.  Nausea.  Vomiting.  Sore throat.  Trouble concentrating.  Feeling cold or chills.  Weak or tired.  Sleepiness and fatigue.  Soreness and body aches. These side effects can affect parts of the body that were not involved in surgery. Follow these instructions at home:  For at least 24 hours after the procedure:  Have a responsible adult stay with you. It is important to have someone help care for you until you are awake and alert.  Rest as needed.  Do not: ? Participate in activities in which you could fall or become injured. ? Drive. ? Use heavy machinery. ? Drink alcohol. ? Take sleeping pills or medicines that cause drowsiness. ? Make important decisions or sign legal documents. ? Take care of children on your own. Eating and drinking  Follow any instructions from your health care provider about eating or drinking restrictions.  When you feel hungry, start by eating small amounts of foods that are soft and easy to digest (bland), such as toast. Gradually return to your regular diet.  Drink enough fluid to keep your urine pale yellow.  If you vomit, rehydrate by drinking water, juice, or clear broth. General instructions  If you have sleep apnea, surgery and certain medicines can increase your risk for breathing problems. Follow instructions from your health care provider about wearing your sleep device: ? Anytime you are sleeping, including during daytime naps. ? While taking prescription pain medicines, sleeping medicines, or medicines that make you drowsy.  Return to your normal activities as told by your health care provider. Ask your health care provider what activities are safe for you.  Take  over-the-counter and prescription medicines only as told by your health care provider.  If you smoke, do not smoke without supervision.  Keep all follow-up visits as told by your health care provider. This is important. Contact a health care provider if:  You have nausea or vomiting that does not get better with medicine.  You cannot eat or drink without vomiting.  You have pain that does not get better with medicine.  You are unable to pass urine.  You develop a skin rash.  You have a fever.  You have redness around your IV site that gets worse. Get help right away if:  You have difficulty breathing.  You have chest pain.  You have blood in your urine or stool, or you vomit blood. Summary  After the procedure, it is common to have a sore throat or nausea. It is also common to feel tired.  Have a responsible adult stay with you for the first 24 hours after general anesthesia. It is important to have someone help care for you until you are awake   and alert.  When you feel hungry, start by eating small amounts of foods that are soft and easy to digest (bland), such as toast. Gradually return to your regular diet.  Drink enough fluid to keep your urine pale yellow.  Return to your normal activities as told by your health care provider. Ask your health care provider what activities are safe for you. This information is not intended to replace advice given to you by your health care provider. Make sure you discuss any questions you have with your health care provider. Document Revised: 11/29/2017 Document Reviewed: 07/12/2017 Elsevier Patient Education  2020 Elsevier Inc.  

## 2020-11-08 NOTE — Interval H&P Note (Signed)
History and Physical Interval Note:  11/08/2020 9:48 AM  [name]  has presented today for surgery, with the diagnosis of right renal calculus.  The various methods of treatment have been discussed with the patient and family. After consideration of risks, benefits and other options for treatment, the patient has consented to  Procedure(s): CYSTOSCOPY WITH RIGHT RETROGRADE PYELOGRAM, RIGHT URETEROSCOPY AND STENT PLACEMENT (Right) as a surgical intervention.  The patient's history has been reviewed, patient examined, no change in status, stable for surgery.  I have reviewed the patient's chart and labs.  Questions were answered to the patient's satisfaction.     Wilkie Aye

## 2020-11-09 ENCOUNTER — Encounter (HOSPITAL_COMMUNITY): Payer: Self-pay | Admitting: Urology

## 2020-11-09 LAB — GLUCOSE, CAPILLARY
Glucose-Capillary: 112 mg/dL — ABNORMAL HIGH (ref 70–99)
Glucose-Capillary: 77 mg/dL (ref 70–99)

## 2020-11-11 ENCOUNTER — Telehealth: Payer: Self-pay

## 2020-11-11 NOTE — Telephone Encounter (Signed)
Pt called to say she pulled stent out as told to. Said she is hurting now and had one before and it did not. Explained it is common. Expressed understanding.

## 2020-11-12 LAB — CALCULI, WITH PHOTOGRAPH (CLINICAL LAB)
Calcium Oxalate Dihydrate: 10 %
Calcium Oxalate Monohydrate: 80 %
Hydroxyapatite: 10 %
Weight Calculi: 54 mg

## 2020-11-16 ENCOUNTER — Ambulatory Visit (INDEPENDENT_AMBULATORY_CARE_PROVIDER_SITE_OTHER): Payer: Commercial Managed Care - PPO | Admitting: Urology

## 2020-11-16 ENCOUNTER — Other Ambulatory Visit: Payer: Self-pay

## 2020-11-16 ENCOUNTER — Encounter: Payer: Self-pay | Admitting: Urology

## 2020-11-16 VITALS — BP 126/65 | HR 71 | Temp 98.5°F | Ht <= 58 in | Wt 205.0 lb

## 2020-11-16 DIAGNOSIS — N201 Calculus of ureter: Secondary | ICD-10-CM | POA: Diagnosis not present

## 2020-11-16 DIAGNOSIS — N200 Calculus of kidney: Secondary | ICD-10-CM | POA: Diagnosis not present

## 2020-11-16 LAB — URINALYSIS, ROUTINE W REFLEX MICROSCOPIC
Bilirubin, UA: NEGATIVE
Glucose, UA: NEGATIVE
Ketones, UA: NEGATIVE
Leukocytes,UA: NEGATIVE
Nitrite, UA: NEGATIVE
Protein,UA: NEGATIVE
RBC, UA: NEGATIVE
Specific Gravity, UA: 1.025 (ref 1.005–1.030)
Urobilinogen, Ur: 0.2 mg/dL (ref 0.2–1.0)
pH, UA: 5.5 (ref 5.0–7.5)

## 2020-11-16 NOTE — Patient Instructions (Signed)
Dietary Guidelines to Help Prevent Kidney Stones Kidney stones are deposits of minerals and salts that form inside your kidneys. Your risk of developing kidney stones may be greater depending on your diet, your lifestyle, the medicines you take, and whether you have certain medical conditions. Most people can reduce their chances of developing kidney stones by following the instructions below. Depending on your overall health and the type of kidney stones you tend to develop, your dietitian may give you more specific instructions. What are tips for following this plan? Reading food labels  Choose foods with "no salt added" or "low-salt" labels. Limit your sodium intake to less than 1500 mg per day.  Choose foods with calcium for each meal and snack. Try to eat about 300 mg of calcium at each meal. Foods that contain 200-500 mg of calcium per serving include: ? 8 oz (237 ml) of milk, fortified nondairy milk, and fortified fruit juice. ? 8 oz (237 ml) of kefir, yogurt, and soy yogurt. ? 4 oz (118 ml) of tofu. ? 1 oz of cheese. ? 1 cup (300 g) of dried figs. ? 1 cup (91 g) of cooked broccoli. ? 1-3 oz can of sardines or mackerel.  Most people need 1000 to 1500 mg of calcium each day. Talk to your dietitian about how much calcium is recommended for you. Shopping  Buy plenty of fresh fruits and vegetables. Most people do not need to avoid fruits and vegetables, even if they contain nutrients that may contribute to kidney stones.  When shopping for convenience foods, choose: ? Whole pieces of fruit. ? Premade salads with dressing on the side. ? Low-fat fruit and yogurt smoothies.  Avoid buying frozen meals or prepared deli foods.  Look for foods with live cultures, such as yogurt and kefir. Cooking  Do not add salt to food when cooking. Place a salt shaker on the table and allow each person to add his or her own salt to taste.  Use vegetable protein, such as beans, textured vegetable  protein (TVP), or tofu instead of meat in pasta, casseroles, and soups. Meal planning   Eat less salt, if told by your dietitian. To do this: ? Avoid eating processed or premade food. ? Avoid eating fast food.  Eat less animal protein, including cheese, meat, poultry, or fish, if told by your dietitian. To do this: ? Limit the number of times you have meat, poultry, fish, or cheese each week. Eat a diet free of meat at least 2 days a week. ? Eat only one serving each day of meat, poultry, fish, or seafood. ? When you prepare animal protein, cut pieces into small portion sizes. For most meat and fish, one serving is about the size of one deck of cards.  Eat at least 5 servings of fresh fruits and vegetables each day. To do this: ? Keep fruits and vegetables on hand for snacks. ? Eat 1 piece of fruit or a handful of berries with breakfast. ? Have a salad and fruit at lunch. ? Have two kinds of vegetables at dinner.  Limit foods that are high in a substance called oxalate. These include: ? Spinach. ? Rhubarb. ? Beets. ? Potato chips and french fries. ? Nuts.  If you regularly take a diuretic medicine, make sure to eat at least 1-2 fruits or vegetables high in potassium each day. These include: ? Avocado. ? Banana. ? Orange, prune, carrot, or tomato juice. ? Baked potato. ? Cabbage. ? Beans and split   peas. General instructions   Drink enough fluid to keep your urine clear or pale yellow. This is the most important thing you can do.  Talk to your health care provider and dietitian about taking daily supplements. Depending on your health and the cause of your kidney stones, you may be advised: ? Not to take supplements with vitamin C. ? To take a calcium supplement. ? To take a daily probiotic supplement. ? To take other supplements such as magnesium, fish oil, or vitamin B6.  Take all medicines and supplements as told by your health care provider.  Limit alcohol intake to no  more than 1 drink a day for nonpregnant women and 2 drinks a day for men. One drink equals 12 oz of beer, 5 oz of wine, or 1 oz of hard liquor.  Lose weight if told by your health care provider. Work with your dietitian to find strategies and an eating plan that works best for you. What foods are not recommended? Limit your intake of the following foods, or as told by your dietitian. Talk to your dietitian about specific foods you should avoid based on the type of kidney stones and your overall health. Grains Breads. Bagels. Rolls. Baked goods. Salted crackers. Cereal. Pasta. Vegetables Spinach. Rhubarb. Beets. Canned vegetables. Pickles. Olives. Meats and other protein foods Nuts. Nut butters. Large portions of meat, poultry, or fish. Salted or cured meats. Deli meats. Hot dogs. Sausages. Dairy Cheese. Beverages Regular soft drinks. Regular vegetable juice. Seasonings and other foods Seasoning blends with salt. Salad dressings. Canned soups. Soy sauce. Ketchup. Barbecue sauce. Canned pasta sauce. Casseroles. Pizza. Lasagna. Frozen meals. Potato chips. French fries. Summary  You can reduce your risk of kidney stones by making changes to your diet.  The most important thing you can do is drink enough fluid. You should drink enough fluid to keep your urine clear or pale yellow.  Ask your health care provider or dietitian how much protein from animal sources you should eat each day, and also how much salt and calcium you should have each day. This information is not intended to replace advice given to you by your health care provider. Make sure you discuss any questions you have with your health care provider. Document Revised: 03/18/2019 Document Reviewed: 11/06/2016 Elsevier Patient Education  2020 Elsevier Inc.  

## 2020-11-16 NOTE — Progress Notes (Signed)
11/16/2020 9:12 AM   [name] 1966/06/13 [id number]  Referring provider: Benita Stabile, MD [address] [name],  [place] [address]  nephrolithiasis  HPI: [name] is a 54yo her for followup for nephrolithiasis. She pulled her tethered stent on POD#3. She drinks little water daily and 2 mountain dews. She denies any flank pain currently. 24 hour urine showed very low volume. Np LUTS   PMH: Past Medical History:  Diagnosis Date  . Diabetes mellitus    insulin pump 2013  . Hypercholesteremia   . Hypertension   . Kidney stone   . Neuropathy   . Obesity   . Obstructive sleep apnea   . Palpitations   . Shortness of breath     Surgical History: Past Surgical History:  Procedure Laterality Date  . BIOPSY  06/23/2020   Procedure: BIOPSY;  Surgeon: Corbin Ade, MD;  [facility];  Service: Endoscopy;;  . CARDIAC CATHETERIZATION  12/11/2012   normal coronary arteries  . CESAREAN SECTION  1994;2000   [facility]  . CHOLECYSTECTOMY  1992  . COLONOSCOPY WITH PROPOFOL N/A 06/23/2020   Procedure: COLONOSCOPY WITH PROPOFOL;  Surgeon: Corbin Ade, MD;  [facility];  Service: Endoscopy;  Laterality: N/A;  7:30am  . CYSTOSCOPY W/ URETERAL STENT PLACEMENT  05/08/2012   Procedure: CYSTOSCOPY WITH RETROGRADE PYELOGRAM/URETERAL STENT PLACEMENT;  Surgeon: Ky Barban, MD;  Location: [facility];  Service: Urology;  Laterality: Right;  . CYSTOSCOPY WITH RETROGRADE PYELOGRAM, URETEROSCOPY AND STENT PLACEMENT Right 05/04/2020   Procedure: CYSTOSCOPY WITH RIGHT RETROGRADE PYELOGRAM, RIGHT URETEROSCOPY AND RIGHT URETERAL STENT EXCHANGE;  Surgeon: Malen Gauze, MD;  Location: [facility];  Service: Urology;  Laterality: Right;  . CYSTOSCOPY WITH RETROGRADE PYELOGRAM, URETEROSCOPY AND STENT PLACEMENT Left 08/08/2020   Procedure: CYSTOSCOPY WITH RETROGRADE PYELOGRAM, URETEROSCOPY WITH LASER  AND STENT PLACEMENT;  Surgeon: Malen Gauze, MD;  Location: [facility];   Service: Urology;  Laterality: Left;  . CYSTOSCOPY WITH RETROGRADE PYELOGRAM, URETEROSCOPY AND STENT PLACEMENT Right 11/08/2020   Procedure: CYSTOSCOPY WITH RIGHT RETROGRADE PYELOGRAM, RIGHT URETEROSCOPY AND STENT PLACEMENT;  Surgeon: Malen Gauze, MD;  Location: [facility];  Service: Urology;  Laterality: Right;  . CYSTOSCOPY WITH STENT PLACEMENT Right 02/25/2020   Procedure: CYSTOSCOPY WITH RIGHT RETROGRADE RIGHT STENT PLACEMENT;  Surgeon: Jerilee Field, MD;  Location: [facility];  Service: Urology;  Laterality: Right;  . ESOPHAGOGASTRODUODENOSCOPY (EGD) WITH PROPOFOL N/A 06/23/2020   Procedure: ESOPHAGOGASTRODUODENOSCOPY (EGD) WITH PROPOFOL;  Surgeon: Corbin Ade, MD;  [facility];  Service: Endoscopy;  Laterality: N/A;  . EXTRACORPOREAL SHOCK WAVE LITHOTRIPSY Right 10/11/2020   Procedure: EXTRACORPOREAL SHOCK WAVE LITHOTRIPSY (ESWL);  Surgeon: Malen Gauze, MD;  Location: [facility];  Service: Urology;  Laterality: Right;  . FOOT SURGERY Right March, 2013   [facility]-removal of bone spur  . HOLMIUM LASER APPLICATION  05/04/2020   Procedure: HOLMIUM LASER LITHOTRIPSY RIGHT URETERAL CALCULUS;  Surgeon: Malen Gauze, MD;  Location: [facility];  Service: Urology;;  . HYSTEROSCOPY WITH THERMACHOICE  04/25/2012   Procedure: HYSTEROSCOPY WITH THERMACHOICE;  Surgeon: Lazaro Arms, MD;  Location: [facility];  Service: Gynecology;  Laterality: N/A;  total therapy time= 11 minutes 42 seconds; 34 ml D5w  in and 34 ml D5w out; temp =87 degrees F  . LEFT HEART CATHETERIZATION WITH CORONARY ANGIOGRAM N/A 12/11/2012   Procedure: LEFT HEART CATHETERIZATION WITH CORONARY ANGIOGRAM;  Surgeon: Runell Gess, MD;  Location: [facility]  CATH LAB;  Service: Cardiovascular;  Laterality: N/A;  . [name] DILATION N/A 06/23/2020   Procedure: [name] DILATION;  Surgeon: Corbin Ade, MD;  [facility];  Service: Endoscopy;  Laterality: N/A;  . Sinus sergery  1988   [place]  . STONE  EXTRACTION WITH BASKET Right 11/08/2020   Procedure: STONE EXTRACTION WITH BASKET;  Surgeon: Malen Gauze, MD;  Location: [facility];  Service: Urology;  Laterality: Right;  . UMBILICAL HERNIA REPAIR N/A 03/25/2014   Procedure: UMBILICAL HERNIA REPAIR;  Surgeon: Marlane Hatcher, MD;  Location: [facility];  Service: General;  Laterality: N/A;  . uterine ablation      Home Medications:  Allergies as of 11/16/2020      Reactions   Ciprofloxacin Hives, Swelling   Penicillins Anaphylaxis, Rash   Codeine Other (See Comments)   had hallicunations   Morphine Nausea And Vomiting, Other (See Comments)   recieved in ED due to [facility] and had multple doses - gave visual hallucinations and vomitting.   Sulfonamide Derivatives Other (See Comments)   REACTION: Unsure - childhood allergy   Vancomycin Itching      Medication List       Accurate as of November 16, 2020  9:12 AM. If you have any questions, ask your nurse or doctor.        albuterol 108 (90 Base) MCG/ACT inhaler Commonly known as: VENTOLIN HFA Inhale 2 puffs into the lungs every 6 (six) hours as needed for wheezing or shortness of breath.   ALPRAZolam 0.5 MG tablet Commonly known as: XANAX Take 0.5 mg by mouth at bedtime.   ARIPiprazole 2 MG tablet Commonly known as: ABILIFY Take 2 mg by mouth at bedtime.   buPROPion 150 MG 24 hr tablet Commonly known as: WELLBUTRIN XL Take 150 mg by mouth daily.   cyclobenzaprine 5 MG tablet Commonly known as: FLEXERIL Take 1 tablet (5 mg total) by mouth 3 (three) times daily as needed for muscle spasms.   desvenlafaxine 50 MG 24 hr tablet Commonly known as: PRISTIQ Take 50 mg by mouth at bedtime.   Desvenlafaxine Succinate ER 25 MG Tb24 Take 1 tablet by mouth daily.   furosemide 20 MG tablet Commonly known as: LASIX Take 40 mg by mouth 2 (two) times daily.   gabapentin 600 MG tablet Commonly known as: NEURONTIN Take 600 mg by mouth 3 (three) times daily.   HumaLOG 100  UNIT/ML injection Generic drug: insulin lispro Inject 3.5 Units into the skin continuous. 3.5 units every 1 hr-insulin pump   ibuprofen 200 MG tablet Commonly known as: ADVIL Take 400 mg by mouth every 6 (six) hours as needed for headache or moderate pain.   multivitamin with minerals Tabs tablet Take 1 tablet by mouth daily.   omeprazole 40 MG capsule Commonly known as: PRILOSEC Take 40 mg by mouth daily.   ondansetron 4 MG tablet Commonly known as: ZOFRAN Take 1 tablet (4 mg total) by mouth every 8 (eight) hours as needed.   oxybutynin 5 MG tablet Commonly known as: DITROPAN Take 5 mg by mouth at bedtime.   oxyCODONE-acetaminophen 5-325 MG tablet Commonly known as: Percocet Take 1 tablet by mouth every 4 (four) hours as needed for severe pain.   pantoprazole 40 MG tablet Commonly known as: PROTONIX Take 1 tablet (40 mg total) by mouth 2 (two) times daily.   Potassium 99 MG Tabs Take 99 mg by mouth daily.   simvastatin 40 MG tablet Commonly known as: ZOCOR  Take 40 mg by mouth daily.   tamsulosin 0.4 MG Caps capsule Commonly known as: FLOMAX TAKE 1 CAPSULE(0.4 MG) BY MOUTH DAILY AFTER AND SUPPER       Allergies:  Allergies  Allergen Reactions  . Ciprofloxacin Hives and Swelling  . Penicillins Anaphylaxis and Rash  . Codeine Other (See Comments)    had hallicunations  . Morphine Nausea And Vomiting and Other (See Comments)    recieved in ED due to [facility] and had multple doses - gave visual hallucinations and vomitting.  . Sulfonamide Derivatives Other (See Comments)    REACTION: Unsure - childhood allergy  . Vancomycin Itching    Family History: Family History  Problem Relation Age of Onset  . Diabetes Mother   . Depression Paternal Grandmother   . Depression Paternal Grandfather   . Heart disease Other   . Cancer Other   . Diabetes Other   . Achalasia Other   . Anesthesia problems Neg Hx   . Hypotension Neg Hx   . Malignant hyperthermia Neg  Hx   . Pseudochol deficiency Neg Hx     Social History:  reports that she has never smoked. She has never used smokeless tobacco. She reports that she does not drink alcohol and does not use drugs.  ROS: All other review of systems were reviewed and are negative except what is noted above in HPI  Physical Exam: BP 126/65   Pulse 71   Temp 98.5 F (36.9 C)   Ht  (1.422 m)   Wt 205 lb (93 kg)   LMP 07/16/2012   BMI 45.96 kg/m   Constitutional:  Alert and oriented, No acute distress. HEENT: [place] AT, moist mucus membranes.  Trachea midline, no masses. Cardiovascular: No clubbing, cyanosis, or edema. Respiratory: Normal respiratory effort, no increased work of breathing. GI: Abdomen is soft, nontender, nondistended, no abdominal masses GU: No CVA tenderness.  Lymph: No cervical or inguinal lymphadenopathy. Skin: No rashes, bruises or suspicious lesions. Neurologic: Grossly intact, no focal deficits, moving all 4 extremities. Psychiatric: Normal mood and affect.  Laboratory Data: Lab Results  Component Value Date   WBC 11.4 (H) 05/02/2020   HGB 12.6 05/04/2020   HCT 37.0 05/04/2020   MCV 86.4 05/02/2020   PLT 384 05/02/2020    Lab Results  Component Value Date   CREATININE 0.76 11/07/2020    No results found for: PSA  No results found for: TESTOSTERONE  Lab Results  Component Value Date   HGBA1C 7.6 (H) 11/07/2020    Urinalysis    Component Value Date/Time   COLORURINE AMBER (A) 04/04/2020 0309   APPEARANCEUR Clear 11/16/2020 0831   LABSPEC 1.020 04/04/2020 0309   PHURINE 6.0 04/04/2020 0309   GLUCOSEU Negative 11/16/2020 0831   HGBUR LARGE (A) 04/04/2020 0309   HGBUR moderate 08/31/2008 0908   BILIRUBINUR Negative 11/16/2020 0831   KETONESUR NEGATIVE 04/04/2020 0309   PROTEINUR Negative 11/16/2020 0831   PROTEINUR 100 (A) 04/04/2020 0309   UROBILINOGEN 0.2 05/25/2020 1555   UROBILINOGEN 0.2 09/21/2014 1055   NITRITE Negative 11/16/2020 0831    NITRITE NEGATIVE 04/04/2020 0309   LEUKOCYTESUR Negative 11/16/2020 0831   LEUKOCYTESUR SMALL (A) 04/04/2020 0309    Lab Results  Component Value Date   LABMICR Comment 11/16/2020   WBCUA 11-30 (A) 10/31/2020   LABEPIT 0-10 10/31/2020   BACTERIA Many (A) 10/31/2020    Pertinent Imaging:  Results for orders placed during the hospital encounter of 10/31/20  DG Abd 1  View  Narrative CLINICAL DATA:  Status post lithotripsy  EXAM: ABDOMEN - 1 VIEW  COMPARISON:  10/10/2020  FINDINGS: Scattered large and small bowel gas is noted. Scattered calculi are noted over the lower pole of the right kidney. The largest of these measures approximately 6 mm. No definitive ureteral stones are noted. Degenerative changes of lumbar spine are seen.  IMPRESSION: Persistent right lower pole renal stones. The largest of these measures approximately 6 mm.   Electronically Signed By: Alcide Clever M.D. On: 10/31/2020 11:38  No results found for this or any previous visit.  No results found for this or any previous visit.  No results found for this or any previous visit.  Results for orders placed during the hospital encounter of 09/14/20  Ultrasound renal complete  Narrative CLINICAL DATA:  Nephrolithiasis  EXAM: RENAL / URINARY TRACT ULTRASOUND COMPLETE  COMPARISON:  06/30/2020, CT 04/04/2020  FINDINGS: Right Kidney:  Renal measurements: 11.2 x 5.7 x 6.4 cm = volume: 213 mL. Echogenicity within normal limits. No hydronephrosis. Small echogenic stone at the lower pole measuring 8 mm.  Left Kidney:  Renal measurements: 11.7 x 5.3 x 6.4 cm = volume: 208 mL. Echogenicity within normal limits. No hydronephrosis. Echogenic shadowing stone within the mid to upper left kidney is not clearly identified today.  Bladder:  Appears normal for degree of bladder distention.  Other:  None.  IMPRESSION: 1. Probable 8 mm stone in the right kidney 2. Previously noted 2.8 cm  echogenic area with shadowing/probable stone in the mid to upper left kidney is not identified on today's study.   Electronically Signed By: Jasmine Pang M.D. On: 09/14/2020 23:24  No results found for this or any previous visit.  No results found for this or any previous visit.  Results for orders placed in visit on 10/31/20  CT RENAL STONE STUDY  Narrative CLINICAL DATA:  Left flank pain with nausea and vomiting  EXAM: CT ABDOMEN AND PELVIS WITHOUT CONTRAST  TECHNIQUE: Multidetector CT imaging of the abdomen and pelvis was performed following the standard protocol without IV contrast.  COMPARISON:  April 04, 2020 CT abdomen and pelvis; October 31, 2020 abdominal radiograph  FINDINGS: Lower chest: Lung bases are clear.  Hepatobiliary: No focal liver lesions are appreciable on this noncontrast enhanced study. Gallbladder is absent. There is no appreciable biliary duct dilatation.  Pancreas: There is no pancreatic mass or inflammatory focus.  Spleen: No splenic lesions are evident.  Adrenals/Urinary Tract: Adrenals bilaterally appear normal. There is no evident renal mass on either side. There is slight hydronephrosis on the right. On the right, there is scarring in the upper pole kidney region. There is a 1 mm calculus in the upper pole the right kidney. There is a calculus in the lower pole of the right kidney measuring 8 x 6 mm. There is a 4 x 3 mm calculus with an adjacent 1 mm calculus in the lower pole of the right kidney as well. There is a 3 x 3 mm calculus in the lower pole left kidney. There is a 2 mm calculus as well as a 1 mm calculus in the upper to mid left kidney. Tiny calculi elsewhere in the mid kidney noted on the left. No ureteral calculus is appreciable on either side. The urinary bladder is midline with wall thickness within normal limits.  Stomach/Bowel: There is no appreciable bowel wall or mesenteric thickening. There are sigmoid  diverticula without diverticulitis. The terminal ileum appears normal. There is  no evident bowel obstruction. There is no free air or portal venous air.  Vascular/Lymphatic: There is no abdominal aortic aneurysm. There are occasional foci of aortic atherosclerosis. No adenopathy is appreciable in the abdomen or pelvis.  Reproductive: The uterus is anteverted.  No evident pelvic mass.  Other: Appendix appears normal. No evident abscess or ascites in the abdomen or pelvis. There is mild fat in the umbilicus with a surgical clip in this area.  Musculoskeletal: There are foci of degenerative change in the lumbar spine. There are no blastic or lytic bone lesions. No intramuscular lesions are evident.  IMPRESSION: 1. Slight hydronephrosis on the right without obstructing lesion appreciable on the right. Particular, no right ureteral calculus. Question recent calculus passage on the right. A degree of pyelonephritis potentially could present in this manner.  2. Nonobstructing calculi on each side, larger and more notable on the right than on the left.  3.  Scarring upper pole right kidney.  4. No evident bowel wall thickening or bowel obstruction. No abscess in the abdomen pelvis. Appendix appears normal. Occasional sigmoid diverticula without diverticulitis.  5.  Aortic Atherosclerosis (ICD10-I70.0).  6.  Gallbladder absent.   Electronically Signed By: Bretta Bang III M.D. On: 10/31/2020 11:38   Assessment & Plan:    1. Nephrolithiasis -RTC 1 month with renal US   - Urinalysis, Routine w reflex microscopic   Return in about 4 weeks (around 12/14/2020) for renal US.  Wilkie Aye, MD  [facility] [place]

## 2020-11-16 NOTE — Progress Notes (Signed)
Urological Symptom Review  Patient is experiencing the following symptoms: Frequent urination Hard to postpone urination Get up at night to urinate Leakage of urine Stream starts and stops Trouble starting stream Have to strain to urinate Blood in urine Weak stream   Review of Systems  Gastrointestinal (upper)  : Negative for upper GI symptoms  Gastrointestinal (lower) : Constipation  Constitutional : Fatigue  Skin: Negative for skin symptoms  Eyes: Negative for eye symptoms  Ear/Nose/Throat : Negative for Ear/Nose/Throat symptoms  Hematologic/Lymphatic: Negative for Hematologic/Lymphatic symptoms  Cardiovascular : Negative for cardiovascular symptoms  Respiratory : Negative for respiratory symptoms  Endocrine: Negative for endocrine symptoms  Musculoskeletal: Negative for musculoskeletal symptoms  Neurological: Negative for neurological symptoms  Psychologic: Depression Anxiety

## 2020-11-23 ENCOUNTER — Ambulatory Visit: Payer: Commercial Managed Care - PPO | Admitting: Gastroenterology

## 2020-11-30 ENCOUNTER — Ambulatory Visit: Payer: Commercial Managed Care - PPO | Admitting: Urology

## 2020-11-30 ENCOUNTER — Ambulatory Visit: Payer: Commercial Managed Care - PPO | Admitting: Gastroenterology

## 2020-12-12 ENCOUNTER — Ambulatory Visit (HOSPITAL_COMMUNITY): Payer: Commercial Managed Care - PPO

## 2020-12-13 ENCOUNTER — Ambulatory Visit (HOSPITAL_COMMUNITY)
Admission: RE | Admit: 2020-12-13 | Discharge: 2020-12-13 | Disposition: A | Discharge status: Home / Self Care | Payer: Commercial Managed Care - PPO | Source: Ambulatory Visit | Attending: Urology | Admitting: Urology

## 2020-12-13 ENCOUNTER — Other Ambulatory Visit: Payer: Self-pay

## 2020-12-13 DIAGNOSIS — N201 Calculus of ureter: Secondary | ICD-10-CM | POA: Diagnosis not present

## 2020-12-14 ENCOUNTER — Ambulatory Visit (INDEPENDENT_AMBULATORY_CARE_PROVIDER_SITE_OTHER): Payer: Commercial Managed Care - PPO | Admitting: Urology

## 2020-12-14 ENCOUNTER — Encounter: Payer: Self-pay | Admitting: Urology

## 2020-12-14 VITALS — BP 147/77 | HR 76 | Temp 98.8°F | Ht <= 58 in | Wt 205.0 lb

## 2020-12-14 DIAGNOSIS — N200 Calculus of kidney: Secondary | ICD-10-CM | POA: Diagnosis not present

## 2020-12-14 LAB — POCT URINALYSIS DIPSTICK
Bilirubin, UA: NEGATIVE
Blood, UA: NEGATIVE
Glucose, UA: NEGATIVE
Ketones, UA: NEGATIVE
Nitrite, UA: NEGATIVE
Protein, UA: NEGATIVE
Spec Grav, UA: >=1.03 — AB (ref 1.010–1.025)
Urobilinogen, UA: 0.2 E.U./dL
pH, UA: 5 (ref 5.0–8.0)

## 2020-12-14 MED ORDER — OXYCODONE-ACETAMINOPHEN 5-325 MG PO TABS: 1.0000 | ORAL_TABLET | ORAL | 0 refills | Status: DC | PRN

## 2020-12-14 NOTE — H&P (View-Only) (Signed)
12/14/2020 8:44 AM   [name] Feb 07, 1966 [id number]  Referring provider: Benita Stabile, MD [address] [name],  [place] [address]  nephrolithiasis  HPI: [name] is a 55yo here for followup for nephrolithiasis. Renal US shows left 80mm calculus and nonshadowing hyperechoic focus in the right lower pole. CT from 11/22 showed the same left lower pole calculus to be 43mm. She has left flank pain that started 1 week ago. She denies any LUTS. UA is normal.    PMH: Past Medical History:  Diagnosis Date  . Diabetes mellitus    insulin pump 2013  . Hypercholesteremia   . Hypertension   . Kidney stone   . Neuropathy   . Obesity   . Obstructive sleep apnea   . Palpitations   . Shortness of breath     Surgical History: Past Surgical History:  Procedure Laterality Date  . BIOPSY  06/23/2020   Procedure: BIOPSY;  Surgeon: Corbin Ade, MD;  [facility];  Service: Endoscopy;;  . CARDIAC CATHETERIZATION  12/11/2012   normal coronary arteries  . CESAREAN SECTION  1994;2000   [facility]  . CHOLECYSTECTOMY  1992  . COLONOSCOPY WITH PROPOFOL N/A 06/23/2020   Procedure: COLONOSCOPY WITH PROPOFOL;  Surgeon: Corbin Ade, MD;  [facility];  Service: Endoscopy;  Laterality: N/A;  7:30am  . CYSTOSCOPY W/ URETERAL STENT PLACEMENT  05/08/2012   Procedure: CYSTOSCOPY WITH RETROGRADE PYELOGRAM/URETERAL STENT PLACEMENT;  Surgeon: Ky Barban, MD;  Location: [facility];  Service: Urology;  Laterality: Right;  . CYSTOSCOPY WITH RETROGRADE PYELOGRAM, URETEROSCOPY AND STENT PLACEMENT Right 05/04/2020   Procedure: CYSTOSCOPY WITH RIGHT RETROGRADE PYELOGRAM, RIGHT URETEROSCOPY AND RIGHT URETERAL STENT EXCHANGE;  Surgeon: Malen Gauze, MD;  Location: [facility];  Service: Urology;  Laterality: Right;  . CYSTOSCOPY WITH RETROGRADE PYELOGRAM, URETEROSCOPY AND STENT PLACEMENT Left 08/08/2020   Procedure: CYSTOSCOPY WITH RETROGRADE PYELOGRAM, URETEROSCOPY WITH LASER  AND  STENT PLACEMENT;  Surgeon: Malen Gauze, MD;  Location: [facility];  Service: Urology;  Laterality: Left;  . CYSTOSCOPY WITH RETROGRADE PYELOGRAM, URETEROSCOPY AND STENT PLACEMENT Right 11/08/2020   Procedure: CYSTOSCOPY WITH RIGHT RETROGRADE PYELOGRAM, RIGHT URETEROSCOPY AND STENT PLACEMENT;  Surgeon: Malen Gauze, MD;  Location: [facility];  Service: Urology;  Laterality: Right;  . CYSTOSCOPY WITH STENT PLACEMENT Right 02/25/2020   Procedure: CYSTOSCOPY WITH RIGHT RETROGRADE RIGHT STENT PLACEMENT;  Surgeon: Jerilee Field, MD;  Location: [facility];  Service: Urology;  Laterality: Right;  . ESOPHAGOGASTRODUODENOSCOPY (EGD) WITH PROPOFOL N/A 06/23/2020   Procedure: ESOPHAGOGASTRODUODENOSCOPY (EGD) WITH PROPOFOL;  Surgeon: Corbin Ade, MD;  [facility];  Service: Endoscopy;  Laterality: N/A;  . EXTRACORPOREAL SHOCK WAVE LITHOTRIPSY Right 10/11/2020   Procedure: EXTRACORPOREAL SHOCK WAVE LITHOTRIPSY (ESWL);  Surgeon: Malen Gauze, MD;  Location: [facility];  Service: Urology;  Laterality: Right;  . FOOT SURGERY Right March, 2013   [facility]-removal of bone spur  . HOLMIUM LASER APPLICATION  05/04/2020   Procedure: HOLMIUM LASER LITHOTRIPSY RIGHT URETERAL CALCULUS;  Surgeon: Malen Gauze, MD;  Location: [facility];  Service: Urology;;  . HYSTEROSCOPY WITH THERMACHOICE  04/25/2012   Procedure: HYSTEROSCOPY WITH THERMACHOICE;  Surgeon: Lazaro Arms, MD;  Location: [facility];  Service: Gynecology;  Laterality: N/A;  total therapy time= 11 minutes 42 seconds; 34 ml D5w  in and 34 ml D5w out; temp =87 degrees F  . LEFT HEART CATHETERIZATION WITH CORONARY ANGIOGRAM N/A 12/11/2012   Procedure:  LEFT HEART CATHETERIZATION WITH CORONARY ANGIOGRAM;  Surgeon: Jonathan J Berry, MD;  [facility];  Service: Cardiovascular;  Laterality: N/A;  . MALONEY DILATION N/A 06/23/2020   Procedure: MALONEY DILATION;  Surgeon: Rourk, Robert M, MD;  [facility];  Service:  Endoscopy;  Laterality: N/A;  . Sinus sergery  1988   [place]  . STONE EXTRACTION WITH BASKET Right 11/08/2020   Procedure: STONE EXTRACTION WITH BASKET;  Surgeon: Amea Mcphail L, MD;  [facility]: [facility];  Service: Urology;  Laterality: Right;  . UMBILICAL HERNIA REPAIR N/A 03/25/2014   Procedure: UMBILICAL HERNIA REPAIR;  Surgeon: William S Bradford, MD;  Location: [facility];  Service: General;  Laterality: N/A;  . uterine ablation      Home Medications:  Allergies as of 12/14/2020      Reactions   Ciprofloxacin Hives, Swelling   Penicillins Anaphylaxis, Rash   Codeine Other (See Comments)   had hallicunations   Morphine Nausea And Vomiting, Other (See Comments)   recieved in ED due to kidneystones and had multple doses - gave visual hallucinations and vomitting.   Sulfonamide Derivatives Other (See Comments)   REACTION: Unsure - childhood allergy   Vancomycin Itching      Medication List       Accurate as of December 14, 2020  8:44 AM. If you have any questions, ask your nurse or doctor.        albuterol 108 (90 Base) MCG/ACT inhaler Commonly known as: VENTOLIN HFA Inhale 2 puffs into the lungs every 6 (six) hours as needed for wheezing or shortness of breath.   ALPRAZolam 0.5 MG tablet Commonly known as: XANAX Take 0.5 mg by mouth at bedtime.   ARIPiprazole 2 MG tablet Commonly known as: ABILIFY Take 2 mg by mouth at bedtime.   buPROPion 150 MG 24 hr tablet Commonly known as: WELLBUTRIN XL Take 150 mg by mouth daily.   cyclobenzaprine 5 MG tablet Commonly known as: FLEXERIL Take 1 tablet (5 mg total) by mouth 3 (three) times daily as needed for muscle spasms.   desvenlafaxine 50 MG 24 hr tablet Commonly known as: PRISTIQ Take 50 mg by mouth at bedtime.   Desvenlafaxine Succinate ER 25 MG Tb24 Take 1 tablet by mouth daily.   furosemide 20 MG tablet Commonly known as: LASIX Take 40 mg by mouth 2 (two) times daily.   gabapentin 600 MG tablet Commonly known  as: NEURONTIN Take 600 mg by mouth 3 (three) times daily.   HumaLOG 100 UNIT/ML injection Generic drug: insulin lispro Inject 3.5 Units into the skin continuous. 3.5 units every 1 hr-insulin pump   ibuprofen 200 MG tablet Commonly known as: ADVIL Take 400 mg by mouth every 6 (six) hours as needed for headache or moderate pain.   multivitamin with minerals Tabs tablet Take 1 tablet by mouth daily.   omeprazole 40 MG capsule Commonly known as: PRILOSEC Take 40 mg by mouth daily.   ondansetron 4 MG tablet Commonly known as: ZOFRAN Take 1 tablet (4 mg total) by mouth every 8 (eight) hours as needed.   oxybutynin 5 MG tablet Commonly known as: DITROPAN Take 5 mg by mouth at bedtime.   oxyCODONE-acetaminophen 5-325 MG tablet Commonly known as: Percocet Take 1 tablet by mouth every 4 (four) hours as needed for severe pain.   pantoprazole 40 MG tablet Commonly known as: PROTONIX Take 1 tablet (40 mg total) by mouth 2 (two) times daily.   Potassium 99 MG Tabs Take   99 mg by mouth daily.   simvastatin 40 MG tablet Commonly known as: ZOCOR Take 40 mg by mouth daily.   tamsulosin 0.4 MG Caps capsule Commonly known as: FLOMAX TAKE 1 CAPSULE(0.4 MG) BY MOUTH DAILY AFTER AND SUPPER       Allergies:  Allergies  Allergen Reactions  . Ciprofloxacin Hives and Swelling  . Penicillins Anaphylaxis and Rash  . Codeine Other (See Comments)    had hallicunations  . Morphine Nausea And Vomiting and Other (See Comments)    recieved in ED due to [facility] and had multple doses - gave visual hallucinations and vomitting.  . Sulfonamide Derivatives Other (See Comments)    REACTION: Unsure - childhood allergy  . Vancomycin Itching    Family History: Family History  Problem Relation Age of Onset  . Diabetes Mother   . Depression Paternal Grandmother   . Depression Paternal Grandfather   . Heart disease Other   . Cancer Other   . Diabetes Other   . Achalasia Other   .  Anesthesia problems Neg Hx   . Hypotension Neg Hx   . Malignant hyperthermia Neg Hx   . Pseudochol deficiency Neg Hx     Social History:  reports that she has never smoked. She has never used smokeless tobacco. She reports that she does not drink alcohol and does not use drugs.  ROS: All other review of systems were reviewed and are negative except what is noted above in HPI  Physical Exam: BP (!) 147/77   Pulse 76   Temp 98.8 F (37.1 C)   Ht 4\' 8"  (1.422 m)   Wt 205 lb (93 kg)   LMP 07/16/2012   BMI 45.96 kg/m   Constitutional:  Alert and oriented, No acute distress. HEENT: [place] AT, moist mucus membranes.  Trachea midline, no masses. Cardiovascular: No clubbing, cyanosis, or edema. Respiratory: Normal respiratory effort, no increased work of breathing. GI: Abdomen is soft, nontender, nondistended, no abdominal masses GU: No CVA tenderness.  Lymph: No cervical or inguinal lymphadenopathy. Skin: No rashes, bruises or suspicious lesions. Neurologic: Grossly intact, no focal deficits, moving all 4 extremities. Psychiatric: Normal mood and affect.  Laboratory Data: Lab Results  Component Value Date   WBC 11.4 (H) 05/02/2020   HGB 12.6 05/04/2020   HCT 37.0 05/04/2020   MCV 86.4 05/02/2020   PLT 384 05/02/2020    Lab Results  Component Value Date   CREATININE 0.76 11/07/2020    No results found for: PSA  No results found for: TESTOSTERONE  Lab Results  Component Value Date   HGBA1C 7.6 (H) 11/07/2020    Urinalysis    Component Value Date/Time   COLORURINE AMBER (A) 04/04/2020 0309   APPEARANCEUR Clear 11/16/2020 0831   LABSPEC 1.020 04/04/2020 0309   PHURINE 6.0 04/04/2020 0309   GLUCOSEU Negative 11/16/2020 0831   HGBUR LARGE (A) 04/04/2020 0309   HGBUR moderate 08/31/2008 0908   BILIRUBINUR neg 12/14/2020 0835   BILIRUBINUR Negative 11/16/2020 0831   KETONESUR NEGATIVE 04/04/2020 0309   PROTEINUR Negative 12/14/2020 0835   PROTEINUR Negative  11/16/2020 0831   PROTEINUR 100 (A) 04/04/2020 0309   UROBILINOGEN 0.2 12/14/2020 0835   UROBILINOGEN 0.2 09/21/2014 1055   NITRITE neg 12/14/2020 0835   NITRITE Negative 11/16/2020 0831   NITRITE NEGATIVE 04/04/2020 0309   LEUKOCYTESUR Trace (A) 12/14/2020 0835   LEUKOCYTESUR Negative 11/16/2020 0831   LEUKOCYTESUR SMALL (A) 04/04/2020 0309    Lab Results  Component Value Date  LABMICR Comment 11/16/2020   WBCUA 11-30 (A) 10/31/2020   LABEPIT 0-10 10/31/2020   BACTERIA Many (A) 10/31/2020    Pertinent Imaging: Renal US yesterday: Images reviewed and discussed with the patient Results for orders placed during the hospital encounter of 10/31/20  DG Abd 1 View  Narrative CLINICAL DATA:  Status post lithotripsy  EXAM: ABDOMEN - 1 VIEW  COMPARISON:  10/10/2020  FINDINGS: Scattered large and small bowel gas is noted. Scattered calculi are noted over the lower pole of the right kidney. The largest of these measures approximately 6 mm. No definitive ureteral stones are noted. Degenerative changes of lumbar spine are seen.  IMPRESSION: Persistent right lower pole renal stones. The largest of these measures approximately 6 mm.   Electronically Signed By: Inez Catalina M.D. On: 10/31/2020 11:38  No results found for this or any previous visit.  No results found for this or any previous visit.  No results found for this or any previous visit.  Results for orders placed during the hospital encounter of 12/13/20  Ultrasound renal complete  Narrative CLINICAL DATA:  Nephrolithiasis.  EXAM: RENAL / URINARY TRACT ULTRASOUND COMPLETE  COMPARISON:  10/31/2020 and prior.  FINDINGS: Right Kidney:  Renal measurements: 11.8 x 5.8 x 7.1 cm = volume: 253.2 mL. Echogenicity within normal limits. No mass visualized. Shadowing calculi measuring up to 10.5 mm. Mild upper pole caliectasis.  Left Kidney:  Renal measurements: 12.4 x 6.0 x 5.2 cm = volume: 202.7  mL. Echogenicity within normal limits. No mass or hydronephrosis visualized. Shadowing calculi measuring up to 9.7 mm.  Bladder:  Appears normal for degree of bladder distention. Bilateral ureteral jets visualized.  Other:  None.  IMPRESSION: Bilateral nephrolithiasis.  Mild right upper pole caliectasis.   Electronically Signed By: Primitivo Gauze M.D. On: 12/13/2020 15:55  No results found for this or any previous visit.  No results found for this or any previous visit.  Results for orders placed in visit on 10/31/20  CT RENAL STONE STUDY  Narrative CLINICAL DATA:  Left flank pain with nausea and vomiting  EXAM: CT ABDOMEN AND PELVIS WITHOUT CONTRAST  TECHNIQUE: Multidetector CT imaging of the abdomen and pelvis was performed following the standard protocol without IV contrast.  COMPARISON:  April 04, 2020 CT abdomen and pelvis; October 31, 2020 abdominal radiograph  FINDINGS: Lower chest: Lung bases are clear.  Hepatobiliary: No focal liver lesions are appreciable on this noncontrast enhanced study. Gallbladder is absent. There is no appreciable biliary duct dilatation.  Pancreas: There is no pancreatic mass or inflammatory focus.  Spleen: No splenic lesions are evident.  Adrenals/Urinary Tract: Adrenals bilaterally appear normal. There is no evident renal mass on either side. There is slight hydronephrosis on the right. On the right, there is scarring in the upper pole kidney region. There is a 1 mm calculus in the upper pole the right kidney. There is a calculus in the lower pole of the right kidney measuring 8 x 6 mm. There is a 4 x 3 mm calculus with an adjacent 1 mm calculus in the lower pole of the right kidney as well. There is a 3 x 3 mm calculus in the lower pole left kidney. There is a 2 mm calculus as well as a 1 mm calculus in the upper to mid left kidney. Tiny calculi elsewhere in the mid kidney noted on the left. No ureteral calculus  is appreciable on either side. The urinary bladder is midline with wall thickness within  normal limits.  Stomach/Bowel: There is no appreciable bowel wall or mesenteric thickening. There are sigmoid diverticula without diverticulitis. The terminal ileum appears normal. There is no evident bowel obstruction. There is no free air or portal venous air.  Vascular/Lymphatic: There is no abdominal aortic aneurysm. There are occasional foci of aortic atherosclerosis. No adenopathy is appreciable in the abdomen or pelvis.  Reproductive: The uterus is anteverted.  No evident pelvic mass.  Other: Appendix appears normal. No evident abscess or ascites in the abdomen or pelvis. There is mild fat in the umbilicus with a surgical clip in this area.  Musculoskeletal: There are foci of degenerative change in the lumbar spine. There are no blastic or lytic bone lesions. No intramuscular lesions are evident.  IMPRESSION: 1. Slight hydronephrosis on the right without obstructing lesion appreciable on the right. Particular, no right ureteral calculus. Question recent calculus passage on the right. A degree of pyelonephritis potentially could present in this manner.  2. Nonobstructing calculi on each side, larger and more notable on the right than on the left.  3.  Scarring upper pole right kidney.  4. No evident bowel wall thickening or bowel obstruction. No abscess in the abdomen pelvis. Appendix appears normal. Occasional sigmoid diverticula without diverticulitis.  5.  Aortic Atherosclerosis (ICD10-I70.0).  6.  Gallbladder absent.   Electronically Signed By: Bretta Bang III M.D. On: 10/31/2020 11:38   Assessment & Plan:    1. Kidney stones -We discussed the management of kidney stones. These options include observation, ureteroscopy, shockwave lithotripsy (ESWL) and percutaneous nephrolithotomy (PCNL). We discussed which options are relevant to the patient's stone(s). We  discussed the natural history of kidney stones as well as the complications of untreated stones and the impact on quality of life without treatment as well as with each of the above listed treatments. We also discussed the efficacy of each treatment in its ability to clear the stone burden. With any of these management options I discussed the signs and symptoms of infection and the need for emergent treatment should these be experienced. For each option we discussed the ability of each procedure to clear the patient of their stone burden.   For observation I described the risks which include but are not limited to silent renal damage, life-threatening infection, need for emergent surgery, failure to pass stone and pain.   For ureteroscopy I described the risks which include bleeding, infection, damage to contiguous structures, positioning injury, ureteral stricture, ureteral avulsion, ureteral injury, need for prolonged ureteral stent, inability to perform ureteroscopy, need for an interval procedure, inability to clear stone burden, stent discomfort/pain, heart attack, stroke, pulmonary embolus and the inherent risks with general anesthesia.   For shockwave lithotripsy I described the risks which include arrhythmia, kidney contusion, kidney hemorrhage, need for transfusion, pain, inability to adequately break up stone, inability to pass stone fragments, Steinstrasse, infection associated with obstructing stones, need for alternate surgical procedure, need for repeat shockwave lithotripsy, MI, CVA, PE and the inherent risks with anesthesia/conscious sedation.   For PCNL I described the risks including positioning injury, pneumothorax, hydrothorax, need for chest tube, inability to clear stone burden, renal laceration, arterial venous fistula or malformation, need for embolization of kidney, loss of kidney or renal function, need for repeat procedure, need for prolonged nephrostomy tube, ureteral avulsion,  MI, CVA, PE and the inherent risks of general anesthesia.   - The patient would like to proceed with Left ureteroscopic stone extraction - POCT urinalysis dipstick   No  follow-ups on file.  Nicolette Bang, MD  [facility] [place]

## 2020-12-14 NOTE — Patient Instructions (Signed)
Ureteroscopy Ureteroscopy is a procedure to check for and treat problems inside part of the urinary tract. In this procedure, a thin, tube-shaped instrument with a light at the end (ureteroscope) is used to look at the inside of the kidneys and the ureters, which are the tubes that carry urine from the kidneys to the bladder. The ureteroscope is inserted into one or both of the ureters. You may need this procedure if you have frequent urinary tract infections (UTIs), blood in your urine, or a stone in one of your ureters. A ureteroscopy can be done to find the cause of urine blockage in a ureter and to evaluate other abnormalities inside the ureters or kidneys. If stones are found, they can be removed during the procedure. Polyps, abnormal tissue, and some types of tumors can also be removed or treated. The ureteroscope may also have a tool to remove tissue to be checked for disease under a microscope (biopsy). Tell a health care provider about:  Any allergies you have.  All medicines you are taking, including vitamins, herbs, eye drops, creams, and over-the-counter medicines.  Any problems you or family members have had with anesthetic medicines.  Any blood disorders you have.  Any surgeries you have had.  Any medical conditions you have.  Whether you are pregnant or may be pregnant. What are the risks? Generally, this is a safe procedure. However, problems may occur, including:  Bleeding.  Infection.  Allergic reactions to medicines.  Scarring that narrows the ureter (stricture).  Creating a hole in the ureter (perforation). What happens before the procedure? Staying hydrated Follow instructions from your health care provider about hydration, which may include:  Up to 2 hours before the procedure - you may continue to drink clear liquids, such as water, clear fruit juice, black coffee, and plain tea. Eating and drinking restrictions Follow instructions from your health care  provider about eating and drinking, which may include:  8 hours before the procedure - stop eating heavy meals or foods such as meat, fried foods, or fatty foods.  6 hours before the procedure - stop eating light meals or foods, such as toast or cereal.  6 hours before the procedure - stop drinking milk or drinks that contain milk.  2 hours before the procedure - stop drinking clear liquids. Medicines  Ask your health care provider about: ? Changing or stopping your regular medicines. This is especially important if you are taking diabetes medicines or blood thinners. ? Taking medicines such as aspirin and ibuprofen. These medicines can thin your blood. Do not take these medicines before your procedure if your health care provider instructs you not to.  You may be given antibiotic medicine to help prevent infection. General instructions  You may have a urine sample taken to check for infection.  Plan to have someone take you home from the hospital or clinic. What happens during the procedure?   To reduce your risk of infection: ? Your health care team will wash or sanitize their hands. ? Your skin will be washed with soap.  An IV tube will be inserted into one of your veins.  You will be given one of the following: ? A medicine to help you relax (sedative). ? A medicine to make you fall asleep (general anesthetic). ? A medicine that is injected into your spine to numb the area below and slightly above the injection site (spinal anesthetic).  To lower your risk of infection, you may be given an antibiotic medicine   by an injection or through the IV tube.  The opening from which you urinate (urethra) will be cleaned with a germ-killing solution.  The ureteroscope will be passed through your urethra into your bladder.  A salt-water solution will flow through the ureteroscope to fill your bladder. This will help the health care provider see the openings of your ureters more  clearly.  Then, the ureteroscope will be passed into your ureter. ? If a growth is found, a piece of it may be removed so it can be examined under a microscope (biopsy). ? If a stone is found, it may be removed through the ureteroscope, or the stone may be broken up using a laser, shock waves, or electrical energy. ? In some cases, if the ureter is too small, a tube may be inserted that keeps the ureter open (ureteral stent). The stent may be left in place for 1 or 2 weeks to keep the ureter open, and then the ureteroscopy procedure will be performed.  The scope will be removed, and your bladder will be emptied. The procedure may vary among health care providers and hospitals. What happens after the procedure?  Your blood pressure, heart rate, breathing rate, and blood oxygen level will be monitored until the medicines you were given have worn off.  You may be asked to urinate.  Donot drive for 24 hours if you were given a sedative. This information is not intended to replace advice given to you by your health care provider. Make sure you discuss any questions you have with your health care provider. Document Revised: 11/08/2017 Document Reviewed: 09/07/2016 Elsevier Patient Education  2020 [employer]  

## 2020-12-14 NOTE — Progress Notes (Signed)
Urological Symptom Review  Patient is experiencing the following symptoms: Frequent urination Get up at night to urinate Leakage of urine Stream starts and stops Trouble starting stream Have to strain to urinate Weak stream  Kidney stones   Review of Systems  Gastrointestinal (upper)  : Negative for upper GI symptoms  Gastrointestinal (lower) : Negative for lower GI symptoms  Constitutional : Fatigue  Skin: Negative for skin symptoms  Eyes: Negative for eye symptoms  Ear/Nose/Throat : Negative for Ear/Nose/Throat symptoms  Hematologic/Lymphatic: Negative for Hematologic/Lymphatic symptoms  Cardiovascular : Negative for cardiovascular symptoms  Respiratory : Negative for respiratory symptoms  Endocrine: Negative for endocrine symptoms  Musculoskeletal: Negative for musculoskeletal symptoms  Neurological: Negative for neurological symptoms  Psychologic: Depression Anxiety

## 2020-12-14 NOTE — Progress Notes (Signed)
12/14/2020 8:44 AM   [name] Feb 07, 1966 [id number]  Referring provider: Benita Stabile, MD [address] [place],  [place] [address]  nephrolithiasis  HPI: [name] is a 55yo here for followup for nephrolithiasis. Renal US shows left 80mm calculus and nonshadowing hyperechoic focus in the right lower pole. CT from 11/22 showed the same left lower pole calculus to be 43mm. She has left flank pain that started 1 week ago. She denies any LUTS. UA is normal.    PMH: Past Medical History:  Diagnosis Date  . Diabetes mellitus    insulin pump 2013  . Hypercholesteremia   . Hypertension   . Kidney stone   . Neuropathy   . Obesity   . Obstructive sleep apnea   . Palpitations   . Shortness of breath     Surgical History: Past Surgical History:  Procedure Laterality Date  . BIOPSY  06/23/2020   Procedure: BIOPSY;  Surgeon: Corbin Ade, MD;  [facility];  Service: Endoscopy;;  . CARDIAC CATHETERIZATION  12/11/2012   normal coronary arteries  . CESAREAN SECTION  1994;2000   [facility]  . CHOLECYSTECTOMY  1992  . COLONOSCOPY WITH PROPOFOL N/A 06/23/2020   Procedure: COLONOSCOPY WITH PROPOFOL;  Surgeon: Corbin Ade, MD;  [facility];  Service: Endoscopy;  Laterality: N/A;  7:30am  . CYSTOSCOPY W/ URETERAL STENT PLACEMENT  05/08/2012   Procedure: CYSTOSCOPY WITH RETROGRADE PYELOGRAM/URETERAL STENT PLACEMENT;  Surgeon: Ky Barban, MD;  Location: [facility];  Service: Urology;  Laterality: Right;  . CYSTOSCOPY WITH RETROGRADE PYELOGRAM, URETEROSCOPY AND STENT PLACEMENT Right 05/04/2020   Procedure: CYSTOSCOPY WITH RIGHT RETROGRADE PYELOGRAM, RIGHT URETEROSCOPY AND RIGHT URETERAL STENT EXCHANGE;  Surgeon: Malen Gauze, MD;  Location: [facility];  Service: Urology;  Laterality: Right;  . CYSTOSCOPY WITH RETROGRADE PYELOGRAM, URETEROSCOPY AND STENT PLACEMENT Left 08/08/2020   Procedure: CYSTOSCOPY WITH RETROGRADE PYELOGRAM, URETEROSCOPY WITH LASER  AND  STENT PLACEMENT;  Surgeon: Malen Gauze, MD;  Location: [facility];  Service: Urology;  Laterality: Left;  . CYSTOSCOPY WITH RETROGRADE PYELOGRAM, URETEROSCOPY AND STENT PLACEMENT Right 11/08/2020   Procedure: CYSTOSCOPY WITH RIGHT RETROGRADE PYELOGRAM, RIGHT URETEROSCOPY AND STENT PLACEMENT;  Surgeon: Malen Gauze, MD;  Location: [facility];  Service: Urology;  Laterality: Right;  . CYSTOSCOPY WITH STENT PLACEMENT Right 02/25/2020   Procedure: CYSTOSCOPY WITH RIGHT RETROGRADE RIGHT STENT PLACEMENT;  Surgeon: Jerilee Field, MD;  Location: [facility];  Service: Urology;  Laterality: Right;  . ESOPHAGOGASTRODUODENOSCOPY (EGD) WITH PROPOFOL N/A 06/23/2020   Procedure: ESOPHAGOGASTRODUODENOSCOPY (EGD) WITH PROPOFOL;  Surgeon: Corbin Ade, MD;  [facility];  Service: Endoscopy;  Laterality: N/A;  . EXTRACORPOREAL SHOCK WAVE LITHOTRIPSY Right 10/11/2020   Procedure: EXTRACORPOREAL SHOCK WAVE LITHOTRIPSY (ESWL);  Surgeon: Malen Gauze, MD;  Location: [facility];  Service: Urology;  Laterality: Right;  . FOOT SURGERY Right March, 2013   [facility]-removal of bone spur  . HOLMIUM LASER APPLICATION  05/04/2020   Procedure: HOLMIUM LASER LITHOTRIPSY RIGHT URETERAL CALCULUS;  Surgeon: Malen Gauze, MD;  Location: [facility];  Service: Urology;;  . HYSTEROSCOPY WITH THERMACHOICE  04/25/2012   Procedure: HYSTEROSCOPY WITH THERMACHOICE;  Surgeon: Lazaro Arms, MD;  Location: [facility];  Service: Gynecology;  Laterality: N/A;  total therapy time= 11 minutes 42 seconds; 34 ml D5w  in and 34 ml D5w out; temp =87 degrees F  . LEFT HEART CATHETERIZATION WITH CORONARY ANGIOGRAM N/A 12/11/2012   Procedure:  LEFT HEART CATHETERIZATION WITH CORONARY ANGIOGRAM;  Surgeon: Runell Gess, MD;  Location: [facility] CATH LAB;  Service: Cardiovascular;  Laterality: N/A;  . [name] DILATION N/A 06/23/2020   Procedure: [name] DILATION;  Surgeon: Corbin Ade, MD;  [facility];  Service:  Endoscopy;  Laterality: N/A;  . Sinus sergery  1988   [place]  . STONE EXTRACTION WITH BASKET Right 11/08/2020   Procedure: STONE EXTRACTION WITH BASKET;  Surgeon: Malen Gauze, MD;  Location: [facility];  Service: Urology;  Laterality: Right;  . UMBILICAL HERNIA REPAIR N/A 03/25/2014   Procedure: UMBILICAL HERNIA REPAIR;  Surgeon: Marlane Hatcher, MD;  Location: [facility];  Service: General;  Laterality: N/A;  . uterine ablation      Home Medications:  Allergies as of 12/14/2020      Reactions   Ciprofloxacin Hives, Swelling   Penicillins Anaphylaxis, Rash   Codeine Other (See Comments)   had hallicunations   Morphine Nausea And Vomiting, Other (See Comments)   recieved in ED due to [facility] and had multple doses - gave visual hallucinations and vomitting.   Sulfonamide Derivatives Other (See Comments)   REACTION: Unsure - childhood allergy   Vancomycin Itching      Medication List       Accurate as of December 14, 2020  8:44 AM. If you have any questions, ask your nurse or doctor.        albuterol 108 (90 Base) MCG/ACT inhaler Commonly known as: VENTOLIN HFA Inhale 2 puffs into the lungs every 6 (six) hours as needed for wheezing or shortness of breath.   ALPRAZolam 0.5 MG tablet Commonly known as: XANAX Take 0.5 mg by mouth at bedtime.   ARIPiprazole 2 MG tablet Commonly known as: ABILIFY Take 2 mg by mouth at bedtime.   buPROPion 150 MG 24 hr tablet Commonly known as: WELLBUTRIN XL Take 150 mg by mouth daily.   cyclobenzaprine 5 MG tablet Commonly known as: FLEXERIL Take 1 tablet (5 mg total) by mouth 3 (three) times daily as needed for muscle spasms.   desvenlafaxine 50 MG 24 hr tablet Commonly known as: PRISTIQ Take 50 mg by mouth at bedtime.   Desvenlafaxine Succinate ER 25 MG Tb24 Take 1 tablet by mouth daily.   furosemide 20 MG tablet Commonly known as: LASIX Take 40 mg by mouth 2 (two) times daily.   gabapentin 600 MG tablet Commonly known  as: NEURONTIN Take 600 mg by mouth 3 (three) times daily.   HumaLOG 100 UNIT/ML injection Generic drug: insulin lispro Inject 3.5 Units into the skin continuous. 3.5 units every 1 hr-insulin pump   ibuprofen 200 MG tablet Commonly known as: ADVIL Take 400 mg by mouth every 6 (six) hours as needed for headache or moderate pain.   multivitamin with minerals Tabs tablet Take 1 tablet by mouth daily.   omeprazole 40 MG capsule Commonly known as: PRILOSEC Take 40 mg by mouth daily.   ondansetron 4 MG tablet Commonly known as: ZOFRAN Take 1 tablet (4 mg total) by mouth every 8 (eight) hours as needed.   oxybutynin 5 MG tablet Commonly known as: DITROPAN Take 5 mg by mouth at bedtime.   oxyCODONE-acetaminophen 5-325 MG tablet Commonly known as: Percocet Take 1 tablet by mouth every 4 (four) hours as needed for severe pain.   pantoprazole 40 MG tablet Commonly known as: PROTONIX Take 1 tablet (40 mg total) by mouth 2 (two) times daily.   Potassium 99 MG Tabs Take  99 mg by mouth daily.   simvastatin 40 MG tablet Commonly known as: ZOCOR Take 40 mg by mouth daily.   tamsulosin 0.4 MG Caps capsule Commonly known as: FLOMAX TAKE 1 CAPSULE(0.4 MG) BY MOUTH DAILY AFTER AND SUPPER       Allergies:  Allergies  Allergen Reactions  . Ciprofloxacin Hives and Swelling  . Penicillins Anaphylaxis and Rash  . Codeine Other (See Comments)    had hallicunations  . Morphine Nausea And Vomiting and Other (See Comments)    recieved in ED due to [facility] and had multple doses - gave visual hallucinations and vomitting.  . Sulfonamide Derivatives Other (See Comments)    REACTION: Unsure - childhood allergy  . Vancomycin Itching    Family History: Family History  Problem Relation Age of Onset  . Diabetes Mother   . Depression Paternal Grandmother   . Depression Paternal Grandfather   . Heart disease Other   . Cancer Other   . Diabetes Other   . Achalasia Other   .  Anesthesia problems Neg Hx   . Hypotension Neg Hx   . Malignant hyperthermia Neg Hx   . Pseudochol deficiency Neg Hx     Social History:  reports that she has never smoked. She has never used smokeless tobacco. She reports that she does not drink alcohol and does not use drugs.  ROS: All other review of systems were reviewed and are negative except what is noted above in HPI  Physical Exam: BP (!) 147/77   Pulse 76   Temp 98.8 F (37.1 C)   Ht 4\' 8"  (1.422 m)   Wt 205 lb (93 kg)   LMP 07/16/2012   BMI 45.96 kg/m   Constitutional:  Alert and oriented, No acute distress. HEENT: [place] AT, moist mucus membranes.  Trachea midline, no masses. Cardiovascular: No clubbing, cyanosis, or edema. Respiratory: Normal respiratory effort, no increased work of breathing. GI: Abdomen is soft, nontender, nondistended, no abdominal masses GU: No CVA tenderness.  Lymph: No cervical or inguinal lymphadenopathy. Skin: No rashes, bruises or suspicious lesions. Neurologic: Grossly intact, no focal deficits, moving all 4 extremities. Psychiatric: Normal mood and affect.  Laboratory Data: Lab Results  Component Value Date   WBC 11.4 (H) 05/02/2020   HGB 12.6 05/04/2020   HCT 37.0 05/04/2020   MCV 86.4 05/02/2020   PLT 384 05/02/2020    Lab Results  Component Value Date   CREATININE 0.76 11/07/2020    No results found for: PSA  No results found for: TESTOSTERONE  Lab Results  Component Value Date   HGBA1C 7.6 (H) 11/07/2020    Urinalysis    Component Value Date/Time   COLORURINE AMBER (A) 04/04/2020 0309   APPEARANCEUR Clear 11/16/2020 0831   LABSPEC 1.020 04/04/2020 0309   PHURINE 6.0 04/04/2020 0309   GLUCOSEU Negative 11/16/2020 0831   HGBUR LARGE (A) 04/04/2020 0309   HGBUR moderate 08/31/2008 0908   BILIRUBINUR neg 12/14/2020 0835   BILIRUBINUR Negative 11/16/2020 0831   KETONESUR NEGATIVE 04/04/2020 0309   PROTEINUR Negative 12/14/2020 0835   PROTEINUR Negative  11/16/2020 0831   PROTEINUR 100 (A) 04/04/2020 0309   UROBILINOGEN 0.2 12/14/2020 0835   UROBILINOGEN 0.2 09/21/2014 1055   NITRITE neg 12/14/2020 0835   NITRITE Negative 11/16/2020 0831   NITRITE NEGATIVE 04/04/2020 0309   LEUKOCYTESUR Trace (A) 12/14/2020 0835   LEUKOCYTESUR Negative 11/16/2020 0831   LEUKOCYTESUR SMALL (A) 04/04/2020 0309    Lab Results  Component Value Date  LABMICR Comment 11/16/2020   WBCUA 11-30 (A) 10/31/2020   LABEPIT 0-10 10/31/2020   BACTERIA Many (A) 10/31/2020    Pertinent Imaging: Renal US yesterday: Images reviewed and discussed with the patient Results for orders placed during the hospital encounter of 10/31/20  DG Abd 1 View  Narrative CLINICAL DATA:  Status post lithotripsy  EXAM: ABDOMEN - 1 VIEW  COMPARISON:  10/10/2020  FINDINGS: Scattered large and small bowel gas is noted. Scattered calculi are noted over the lower pole of the right kidney. The largest of these measures approximately 6 mm. No definitive ureteral stones are noted. Degenerative changes of lumbar spine are seen.  IMPRESSION: Persistent right lower pole renal stones. The largest of these measures approximately 6 mm.   Electronically Signed By: Inez Catalina M.D. On: 10/31/2020 11:38  No results found for this or any previous visit.  No results found for this or any previous visit.  No results found for this or any previous visit.  Results for orders placed during the hospital encounter of 12/13/20  Ultrasound renal complete  Narrative CLINICAL DATA:  Nephrolithiasis.  EXAM: RENAL / URINARY TRACT ULTRASOUND COMPLETE  COMPARISON:  10/31/2020 and prior.  FINDINGS: Right Kidney:  Renal measurements: 11.8 x 5.8 x 7.1 cm = volume: 253.2 mL. Echogenicity within normal limits. No mass visualized. Shadowing calculi measuring up to 10.5 mm. Mild upper pole caliectasis.  Left Kidney:  Renal measurements: 12.4 x 6.0 x 5.2 cm = volume: 202.7  mL. Echogenicity within normal limits. No mass or hydronephrosis visualized. Shadowing calculi measuring up to 9.7 mm.  Bladder:  Appears normal for degree of bladder distention. Bilateral ureteral jets visualized.  Other:  None.  IMPRESSION: Bilateral nephrolithiasis.  Mild right upper pole caliectasis.   Electronically Signed By: Primitivo Gauze M.D. On: 12/13/2020 15:55  No results found for this or any previous visit.  No results found for this or any previous visit.  Results for orders placed in visit on 10/31/20  CT RENAL STONE STUDY  Narrative CLINICAL DATA:  Left flank pain with nausea and vomiting  EXAM: CT ABDOMEN AND PELVIS WITHOUT CONTRAST  TECHNIQUE: Multidetector CT imaging of the abdomen and pelvis was performed following the standard protocol without IV contrast.  COMPARISON:  April 04, 2020 CT abdomen and pelvis; October 31, 2020 abdominal radiograph  FINDINGS: Lower chest: Lung bases are clear.  Hepatobiliary: No focal liver lesions are appreciable on this noncontrast enhanced study. Gallbladder is absent. There is no appreciable biliary duct dilatation.  Pancreas: There is no pancreatic mass or inflammatory focus.  Spleen: No splenic lesions are evident.  Adrenals/Urinary Tract: Adrenals bilaterally appear normal. There is no evident renal mass on either side. There is slight hydronephrosis on the right. On the right, there is scarring in the upper pole kidney region. There is a 1 mm calculus in the upper pole the right kidney. There is a calculus in the lower pole of the right kidney measuring 8 x 6 mm. There is a 4 x 3 mm calculus with an adjacent 1 mm calculus in the lower pole of the right kidney as well. There is a 3 x 3 mm calculus in the lower pole left kidney. There is a 2 mm calculus as well as a 1 mm calculus in the upper to mid left kidney. Tiny calculi elsewhere in the mid kidney noted on the left. No ureteral calculus  is appreciable on either side. The urinary bladder is midline with wall thickness within  normal limits.  Stomach/Bowel: There is no appreciable bowel wall or mesenteric thickening. There are sigmoid diverticula without diverticulitis. The terminal ileum appears normal. There is no evident bowel obstruction. There is no free air or portal venous air.  Vascular/Lymphatic: There is no abdominal aortic aneurysm. There are occasional foci of aortic atherosclerosis. No adenopathy is appreciable in the abdomen or pelvis.  Reproductive: The uterus is anteverted.  No evident pelvic mass.  Other: Appendix appears normal. No evident abscess or ascites in the abdomen or pelvis. There is mild fat in the umbilicus with a surgical clip in this area.  Musculoskeletal: There are foci of degenerative change in the lumbar spine. There are no blastic or lytic bone lesions. No intramuscular lesions are evident.  IMPRESSION: 1. Slight hydronephrosis on the right without obstructing lesion appreciable on the right. Particular, no right ureteral calculus. Question recent calculus passage on the right. A degree of pyelonephritis potentially could present in this manner.  2. Nonobstructing calculi on each side, larger and more notable on the right than on the left.  3.  Scarring upper pole right kidney.  4. No evident bowel wall thickening or bowel obstruction. No abscess in the abdomen pelvis. Appendix appears normal. Occasional sigmoid diverticula without diverticulitis.  5.  Aortic Atherosclerosis (ICD10-I70.0).  6.  Gallbladder absent.   Electronically Signed By: Bretta Bang III M.D. On: 10/31/2020 11:38   Assessment & Plan:    1. Kidney stones -We discussed the management of kidney stones. These options include observation, ureteroscopy, shockwave lithotripsy (ESWL) and percutaneous nephrolithotomy (PCNL). We discussed which options are relevant to the patient's stone(s). We  discussed the natural history of kidney stones as well as the complications of untreated stones and the impact on quality of life without treatment as well as with each of the above listed treatments. We also discussed the efficacy of each treatment in its ability to clear the stone burden. With any of these management options I discussed the signs and symptoms of infection and the need for emergent treatment should these be experienced. For each option we discussed the ability of each procedure to clear the patient of their stone burden.   For observation I described the risks which include but are not limited to silent renal damage, life-threatening infection, need for emergent surgery, failure to pass stone and pain.   For ureteroscopy I described the risks which include bleeding, infection, damage to contiguous structures, positioning injury, ureteral stricture, ureteral avulsion, ureteral injury, need for prolonged ureteral stent, inability to perform ureteroscopy, need for an interval procedure, inability to clear stone burden, stent discomfort/pain, heart attack, stroke, pulmonary embolus and the inherent risks with general anesthesia.   For shockwave lithotripsy I described the risks which include arrhythmia, kidney contusion, kidney hemorrhage, need for transfusion, pain, inability to adequately break up stone, inability to pass stone fragments, Steinstrasse, infection associated with obstructing stones, need for alternate surgical procedure, need for repeat shockwave lithotripsy, MI, CVA, PE and the inherent risks with anesthesia/conscious sedation.   For PCNL I described the risks including positioning injury, pneumothorax, hydrothorax, need for chest tube, inability to clear stone burden, renal laceration, arterial venous fistula or malformation, need for embolization of kidney, loss of kidney or renal function, need for repeat procedure, need for prolonged nephrostomy tube, ureteral avulsion,  MI, CVA, PE and the inherent risks of general anesthesia.   - The patient would like to proceed with Left ureteroscopic stone extraction - POCT urinalysis dipstick   No  follow-ups on file.  Nicolette Bang, MD  [facility] [place]

## 2020-12-23 ENCOUNTER — Encounter (HOSPITAL_COMMUNITY): Payer: Self-pay

## 2020-12-26 ENCOUNTER — Other Ambulatory Visit (HOSPITAL_COMMUNITY): Admission: RE | Admit: 2020-12-26 | Payer: Commercial Managed Care - PPO | Source: Ambulatory Visit

## 2020-12-26 ENCOUNTER — Other Ambulatory Visit (HOSPITAL_COMMUNITY)
Admission: RE | Admit: 2020-12-26 | Discharge: 2020-12-26 | Disposition: A | Discharge status: Home / Self Care | Payer: Commercial Managed Care - PPO | Source: Ambulatory Visit | Attending: Urology | Admitting: Urology

## 2020-12-26 ENCOUNTER — Other Ambulatory Visit: Payer: Self-pay

## 2020-12-26 ENCOUNTER — Encounter (HOSPITAL_COMMUNITY)
Admission: RE | Admit: 2020-12-26 | Discharge: 2020-12-26 | Disposition: A | Discharge status: Home / Self Care | Payer: Commercial Managed Care - PPO | Source: Ambulatory Visit | Attending: Urology | Admitting: Urology

## 2020-12-26 ENCOUNTER — Encounter (HOSPITAL_COMMUNITY): Admission: RE | Admit: 2020-12-26 | Payer: Commercial Managed Care - PPO | Source: Ambulatory Visit

## 2020-12-26 DIAGNOSIS — Z01812 Encounter for preprocedural laboratory examination: Secondary | ICD-10-CM | POA: Diagnosis not present

## 2020-12-26 DIAGNOSIS — Z20822 Contact with and (suspected) exposure to covid-19: Secondary | ICD-10-CM | POA: Diagnosis not present

## 2020-12-26 LAB — BASIC METABOLIC PANEL
Anion gap: 6 (ref 5–15)
BUN: 13 mg/dL (ref 6–20)
CO2: 28 mmol/L (ref 22–32)
Calcium: 8.5 mg/dL — ABNORMAL LOW (ref 8.9–10.3)
Chloride: 104 mmol/L (ref 98–111)
Creatinine, Ser: 0.6 mg/dL (ref 0.44–1.00)
GFR, Estimated: >60 mL/min (ref >60)
Glucose, Bld: 175 mg/dL — ABNORMAL HIGH (ref 70–99)
Potassium: 3.4 mmol/L — ABNORMAL LOW (ref 3.5–5.1)
Sodium: 138 mmol/L (ref 135–145)

## 2020-12-26 LAB — SARS CORONAVIRUS 2 (TAT 6-24 HRS): SARS Coronavirus 2: NEGATIVE

## 2020-12-27 ENCOUNTER — Ambulatory Visit (HOSPITAL_COMMUNITY): Payer: Commercial Managed Care - PPO | Admitting: Anesthesiology

## 2020-12-27 ENCOUNTER — Ambulatory Visit (HOSPITAL_COMMUNITY): Payer: Commercial Managed Care - PPO

## 2020-12-27 ENCOUNTER — Encounter (HOSPITAL_COMMUNITY): Payer: Self-pay | Admitting: Urology

## 2020-12-27 ENCOUNTER — Ambulatory Visit (HOSPITAL_COMMUNITY)
Admission: RE | Admit: 2020-12-27 | Discharge: 2020-12-27 | Disposition: A | Discharge status: Home / Self Care | Payer: Commercial Managed Care - PPO | Attending: Urology | Admitting: Urology

## 2020-12-27 ENCOUNTER — Encounter (HOSPITAL_COMMUNITY)
Admission: RE | Disposition: A | Discharge status: Home / Self Care | Payer: Self-pay | Source: Home / Self Care | Attending: Urology

## 2020-12-27 ENCOUNTER — Other Ambulatory Visit: Payer: Self-pay

## 2020-12-27 DIAGNOSIS — N200 Calculus of kidney: Secondary | ICD-10-CM | POA: Diagnosis present

## 2020-12-27 DIAGNOSIS — Z79899 Other long term (current) drug therapy: Secondary | ICD-10-CM | POA: Insufficient documentation

## 2020-12-27 DIAGNOSIS — Z794 Long term (current) use of insulin: Secondary | ICD-10-CM | POA: Insufficient documentation

## 2020-12-27 DIAGNOSIS — Z87442 Personal history of urinary calculi: Secondary | ICD-10-CM | POA: Diagnosis not present

## 2020-12-27 HISTORY — PX: CYSTOSCOPY WITH RETROGRADE PYELOGRAM, URETEROSCOPY AND STENT PLACEMENT: SHX5789

## 2020-12-27 LAB — GLUCOSE, CAPILLARY
Glucose-Capillary: 70 mg/dL (ref 70–99)
Glucose-Capillary: 164 mg/dL — ABNORMAL HIGH (ref 70–99)
Glucose-Capillary: 88 mg/dL (ref 70–99)

## 2020-12-27 SURGERY — CYSTOURETEROSCOPY, WITH RETROGRADE PYELOGRAM AND STENT INSERTION
Anesthesia: General | Site: Ureter | Laterality: Left

## 2020-12-27 MED ORDER — ONDANSETRON HCL 4 MG/2ML IJ SOLN
INTRAMUSCULAR | Status: AC
  Filled 2020-12-27: qty 2

## 2020-12-27 MED ORDER — OXYCODONE-ACETAMINOPHEN 5-325 MG PO TABS: 1.0000 | ORAL_TABLET | ORAL | 0 refills | Status: DC | PRN

## 2020-12-27 MED ORDER — ONDANSETRON HCL 4 MG/2ML IJ SOLN
INTRAMUSCULAR | Status: DC | PRN
  Administered 2020-12-27: 4 mg via INTRAVENOUS

## 2020-12-27 MED ORDER — PROPOFOL 10 MG/ML IV BOLUS
INTRAVENOUS | Status: AC
  Filled 2020-12-27: qty 20

## 2020-12-27 MED ORDER — DEXTROSE 50 % IV SOLN
INTRAVENOUS | Status: AC
  Filled 2020-12-27: qty 50

## 2020-12-27 MED ORDER — ORAL CARE MOUTH RINSE: 15.0000 mL | Freq: Once | OROMUCOSAL | Status: AC

## 2020-12-27 MED ORDER — PROMETHAZINE HCL 25 MG/ML IJ SOLN
6.2500 mg | INTRAMUSCULAR | Status: DC | PRN
  Administered 2020-12-27: 6.25 mg via INTRAVENOUS
  Filled 2020-12-27: qty 1

## 2020-12-27 MED ORDER — DEXTROSE 50 % IV SOLN
1.0000 | Freq: Once | INTRAVENOUS | Status: AC
  Administered 2020-12-27: 50 mL via INTRAVENOUS

## 2020-12-27 MED ORDER — SODIUM CHLORIDE 0.9 % IR SOLN
Status: DC | PRN
  Administered 2020-12-27 (×2): 3000 mL

## 2020-12-27 MED ORDER — LACTATED RINGERS IV SOLN: INTRAVENOUS | Status: DC

## 2020-12-27 MED ORDER — DEXTROSE 50 % IV SOLN: 25.0000 mL | Freq: Once | INTRAVENOUS | Status: DC

## 2020-12-27 MED ORDER — FENTANYL CITRATE (PF) 250 MCG/5ML IJ SOLN
INTRAMUSCULAR | Status: AC
  Filled 2020-12-27: qty 5

## 2020-12-27 MED ORDER — WATER FOR IRRIGATION, STERILE IR SOLN
Status: DC | PRN
  Administered 2020-12-27: 500 mL

## 2020-12-27 MED ORDER — CHLORHEXIDINE GLUCONATE 0.12 % MT SOLN
15.0000 mL | Freq: Once | OROMUCOSAL | Status: AC
  Administered 2020-12-27: 15 mL via OROMUCOSAL
  Filled 2020-12-27: qty 15

## 2020-12-27 MED ORDER — LIDOCAINE HCL (CARDIAC) PF 100 MG/5ML IV SOSY
PREFILLED_SYRINGE | INTRAVENOUS | Status: DC | PRN
  Administered 2020-12-27: 100 mg via INTRATRACHEAL

## 2020-12-27 MED ORDER — MIDAZOLAM HCL 2 MG/2ML IJ SOLN
INTRAMUSCULAR | Status: AC
  Filled 2020-12-27: qty 2

## 2020-12-27 MED ORDER — SUCCINYLCHOLINE CHLORIDE 200 MG/10ML IV SOSY
PREFILLED_SYRINGE | INTRAVENOUS | Status: AC
  Filled 2020-12-27: qty 10

## 2020-12-27 MED ORDER — GENTAMICIN SULFATE 40 MG/ML IJ SOLN
320.0000 mg | INTRAVENOUS | Status: AC
  Administered 2020-12-27: 320 mg via INTRAVENOUS
  Filled 2020-12-27: qty 8

## 2020-12-27 MED ORDER — EPHEDRINE 5 MG/ML INJ
INTRAVENOUS | Status: AC
  Filled 2020-12-27: qty 10

## 2020-12-27 MED ORDER — ROCURONIUM BROMIDE 10 MG/ML (PF) SYRINGE
PREFILLED_SYRINGE | INTRAVENOUS | Status: AC
  Filled 2020-12-27: qty 10

## 2020-12-27 MED ORDER — DEXAMETHASONE SODIUM PHOSPHATE 10 MG/ML IJ SOLN
INTRAMUSCULAR | Status: DC | PRN
  Administered 2020-12-27: 5 mg via INTRAVENOUS

## 2020-12-27 MED ORDER — HYDROMORPHONE HCL 1 MG/ML IJ SOLN
0.2500 mg | INTRAMUSCULAR | Status: DC | PRN
  Administered 2020-12-27 (×2): 0.5 mg via INTRAVENOUS
  Filled 2020-12-27 (×2): qty 0.5

## 2020-12-27 MED ORDER — LIDOCAINE HCL (PF) 2 % IJ SOLN
INTRAMUSCULAR | Status: AC
  Filled 2020-12-27: qty 5

## 2020-12-27 MED ORDER — PROPOFOL 10 MG/ML IV BOLUS
INTRAVENOUS | Status: DC | PRN
  Administered 2020-12-27: 200 mg via INTRAVENOUS

## 2020-12-27 MED ORDER — DEXAMETHASONE SODIUM PHOSPHATE 10 MG/ML IJ SOLN
INTRAMUSCULAR | Status: AC
  Filled 2020-12-27: qty 1

## 2020-12-27 MED ORDER — DIATRIZOATE MEGLUMINE 30 % UR SOLN
URETHRAL | Status: AC
  Filled 2020-12-27: qty 100

## 2020-12-27 MED ORDER — MEPERIDINE HCL 50 MG/ML IJ SOLN: 6.2500 mg | INTRAMUSCULAR | Status: DC | PRN

## 2020-12-27 MED ORDER — FENTANYL CITRATE (PF) 100 MCG/2ML IJ SOLN
INTRAMUSCULAR | Status: DC | PRN
  Administered 2020-12-27: 100 ug via INTRAVENOUS

## 2020-12-27 MED ORDER — SUCCINYLCHOLINE CHLORIDE 200 MG/10ML IV SOSY
PREFILLED_SYRINGE | INTRAVENOUS | Status: DC | PRN
  Administered 2020-12-27: 160 mg via INTRAVENOUS

## 2020-12-27 MED ORDER — DIATRIZOATE MEGLUMINE 30 % UR SOLN
URETHRAL | Status: DC | PRN
  Administered 2020-12-27: 9 mL via URETHRAL

## 2020-12-27 MED ORDER — EPHEDRINE SULFATE 50 MG/ML IJ SOLN
INTRAMUSCULAR | Status: DC | PRN
  Administered 2020-12-27 (×2): 10 mg via INTRAVENOUS

## 2020-12-27 MED ORDER — MIDAZOLAM HCL 2 MG/2ML IJ SOLN
INTRAMUSCULAR | Status: DC | PRN
  Administered 2020-12-27: 2 mg via INTRAVENOUS

## 2020-12-27 MED ORDER — ONDANSETRON HCL 4 MG PO TABS
4.0000 mg | ORAL_TABLET | Freq: Three times a day (TID) | ORAL | 0 refills | Status: DC | PRN
  Filled 2021-06-29: qty 12, 4d supply, fill #0

## 2020-12-27 SURGICAL SUPPLY — 28 items
BAG DRAIN URO TABLE W/ADPT NS (BAG) ×3 IMPLANT
BAG DRN 8 ADPR NS SKTRN CSTL (BAG) ×2
BAG HAMPER (MISCELLANEOUS) ×3 IMPLANT
CATH INTERMIT  6FR 70CM (CATHETERS) ×3 IMPLANT
CLOTH BEACON ORANGE TIMEOUT ST (SAFETY) ×3 IMPLANT
DECANTER SPIKE VIAL GLASS SM (MISCELLANEOUS) ×3 IMPLANT
EXTRACTOR STONE NITINOL NGAGE (UROLOGICAL SUPPLIES) ×2 IMPLANT
GLOVE BIO SURGEON STRL SZ8 (GLOVE) ×3 IMPLANT
GLOVE BIOGEL PI IND STRL 7.0 (GLOVE) ×6 IMPLANT
GLOVE BIOGEL PI INDICATOR 7.0 (GLOVE) ×3
GLOVE ECLIPSE 6.5 STRL STRAW (GLOVE) ×2 IMPLANT
GOWN STRL REUS W/TWL LRG LVL3 (GOWN DISPOSABLE) ×3 IMPLANT
GOWN STRL REUS W/TWL XL LVL3 (GOWN DISPOSABLE) ×3 IMPLANT
GUIDEWIRE STR DUAL SENSOR (WIRE) ×3 IMPLANT
GUIDEWIRE STR ZIPWIRE 035X150 (MISCELLANEOUS) ×3 IMPLANT
IV NS IRRIG 3000ML ARTHROMATIC (IV SOLUTION) ×6 IMPLANT
KIT TURNOVER CYSTO (KITS) ×3 IMPLANT
MANIFOLD NEPTUNE II (INSTRUMENTS) ×3 IMPLANT
PACK CYSTO (CUSTOM PROCEDURE TRAY) ×3 IMPLANT
PAD ARMBOARD 7.5X6 YLW CONV (MISCELLANEOUS) ×3 IMPLANT
SHEATH URETERAL 12FRX35CM (MISCELLANEOUS) ×2 IMPLANT
STENT URET 6FRX26 CONTOUR (STENTS) ×3 IMPLANT
SYR 10ML LL (SYRINGE) ×3 IMPLANT
SYR CONTROL 10ML LL (SYRINGE) ×3 IMPLANT
TOWEL OR 17X26 4PK STRL BLUE (TOWEL DISPOSABLE) ×3 IMPLANT
TRACTIP FLEXIVA PULS ID 200XHI (Laser) ×2 IMPLANT
TRACTIP FLEXIVA PULSE ID 200 (Laser) ×3
WATER STERILE IRR 500ML POUR (IV SOLUTION) ×3 IMPLANT

## 2020-12-27 NOTE — Transfer of Care (Signed)
Immediate Anesthesia Transfer of Care Note  Patient: [name]  Procedure(s) Performed: CYSTOSCOPY WITH LEFT RETROGRADE PYELOGRAM, LEFT URETEROSCOPY AND LEFT URETERAL STENT PLACEMENT (Left Ureter)  Patient Location: PACU  Anesthesia Type:General  Level of Consciousness: awake, drowsy and patient cooperative  Airway & Oxygen Therapy: Patient Spontanous Breathing and Patient connected to nasal cannula oxygen  Post-op Assessment: Report given to RN  Post vital signs: Reviewed and stable  Last Vitals:  Vitals Value Taken Time  BP 128/71 12/27/20 1147  Temp    Pulse 89 12/27/20 1150  Resp 13 12/27/20 1150  SpO2 97 % 12/27/20 1150  Vitals shown include unvalidated device data.  Last Pain:  Vitals:   12/27/20 0916  TempSrc: Oral  PainSc: 8          Complications: No complications documented.

## 2020-12-27 NOTE — Op Note (Signed)
.  Preoperative diagnosis: Left renalstone  Postoperative diagnosis: Same  Procedure: 1 cystoscopy 2. Left retrograde pyelography 3.  Intraoperative fluoroscopy, under one hour, with interpretation 4.  Left ureteroscopic stone manipulation with basket extraction 5.  Left 6 x 26 JJ stent placement  Attending: Cleda Mccreedy  Anesthesia: General  Estimated blood loss: None  Drains: Left 6 x 26 JJ ureteral stent with tether  Specimens: stone for analysis  Antibiotics: Gentamicin  Findings: left upper pole calculi. No hydronephrosis. No masses/lesions in the bladder. Ureteral orifices in normal anatomic location.  Indications: Patient is a 55 year old female with a history of left renal stone and who has persistent left flank pain.  After discussing treatment options, she decided proceed with left ureteroscopic stone manipulation.  Procedure her in detail: The patient was brought to the operating room and a brief timeout was done to ensure correct patient, correct procedure, correct site.  General anesthesia was administered patient was placed in dorsal lithotomy position.  Her genitalia was then prepped and draped in usual sterile fashion.  A rigid 22 French cystoscope was passed in the urethra and the bladder.  Bladder was inspected free masses or lesions.  the ureteral orifices were in the normal orthotopic locations.  a 6 french ureteral catheter was then instilled into the left ureteral orifice.  a gentle retrograde was obtained and findings noted above.  we then placed a zip wire through the ureteral catheter and advanced up to the renal pelvis.  we then removed the cystoscope and cannulated the left ureteral orifice with a semirigid ureteroscope.  No stone was found in the ureter. Once we reached the UPJ a sensor wire was advanced in to the renal pelvis. We then removed the ureteroscope and advanced am 12/14 x 35cm access sheath up to the renal pelvis. We then used the flexible  ureteroscope to perform nephroscopy. We encountered the stones in the upper pole.    The stones were then removed with an NGage basket.  once all stones were removed we then removed the access sheath under direct vision and noted no injury to the ureter. We then placed a 6 x 26 double-j ureteral stent over the original zip wire.  We then removed the wire and good coil was noted in the the renal pelvis under fluoroscopy and the bladder under direct vision. the bladder was then drained and this concluded the procedure which was well tolerated by patient.  Complications: None  Condition: Stable, extubated, transferred to PACU  Plan: Patient is to be discharged home as to follow-up in one week. She is to remove her stent in 72 hours by pulling the tether

## 2020-12-27 NOTE — Discharge Instructions (Signed)
Ureteral Stent Implantation, Care After This sheet gives you information about how to care for yourself after your procedure. Your health care provider may also give you more specific instructions. If you have problems or questions, contact your health care provider. What can I expect after the procedure? After the procedure, it is common to have:  Nausea.  Mild pain when you urinate. You may feel this pain in your lower back or lower abdomen. The pain should stop within a few minutes after you urinate. This may last for up to 1 week.  A small amount of blood in your urine for several days. Follow these instructions at home: Medicines  Take over-the-counter and prescription medicines only as told by your health care provider.  If you were prescribed an antibiotic medicine, take it as told by your health care provider. Do not stop taking the antibiotic even if you start to feel better.  Do not drive for 24 hours if you were given a sedative during your procedure.  Ask your health care provider if the medicine prescribed to you requires you to avoid driving or using heavy machinery. Activity  Rest as told by your health care provider.  Avoid sitting for a long time without moving. Get up to take short walks every 1-2 hours. This is important to improve blood flow and breathing. Ask for help if you feel weak or unsteady.  Return to your normal activities as told by your health care provider. Ask your health care provider what activities are safe for you. General instructions  Watch for any blood in your urine. Call your health care provider if the amount of blood in your urine increases.  If you have a catheter: ? Follow instructions from your health care provider about taking care of your catheter and collection bag. ? Do not take baths, swim, or use a hot tub until your health care provider approves. Ask your health care provider if you may take showers. You may only be allowed to  take sponge baths.  Drink enough fluid to keep your urine pale yellow.  Do not use any products that contain nicotine or tobacco, such as cigarettes, e-cigarettes, and chewing tobacco. These can delay healing after surgery. If you need help quitting, ask your health care provider.  Keep all follow-up visits as told by your health care provider. This is important.   Contact a health care provider if:  You have pain that gets worse or does not get better with medicine, especially pain when you urinate.  You have difficulty urinating.  You feel nauseous or you vomit repeatedly during a period of more than 2 days after the procedure. Get help right away if:  Your urine is dark red or has blood clots in it.  You are leaking urine (have incontinence).  The end of the stent comes out of your urethra.  You cannot urinate.  You have sudden, sharp, or severe pain in your abdomen or lower back.  You have a fever.  You have swelling or pain in your legs.  You have difficulty breathing. Summary  After the procedure, it is common to have mild pain when you urinate that goes away within a few minutes after you urinate. This may last for up to 1 week.  Watch for any blood in your urine. Call your health care provider if the amount of blood in your urine increases.  Take over-the-counter and prescription medicines only as told by your health care provider.  Drink enough fluid to keep your urine pale yellow. This information is not intended to replace advice given to you by your health care provider. Make sure you discuss any questions you have with your health care provider. Document Revised: 09/02/2018 Document Reviewed: 09/03/2018 Elsevier Patient Education  2021 [employer]  PLEASE REMOVE YOUR STENT IN 72 HOURS BY GENTLY PULLING THE STRING  Oxycodone Capsules or Tablets What is this medicine? OXYCODONE (ox i KOE done) is a pain reliever, also called an opioid. It treats severe  pain. This medicine may be used for other purposes; ask your health care provider or pharmacist if you have questions. COMMON BRAND NAME(S): Dazidox, Endocodone, Oxaydo, OXECTA, OxyIR, Percolone, Roxicodone, Roxybond What should I tell my health care provider before I take this medicine? They need to know if you have any of these conditions:  brain tumor  drug abuse or addiction  head injury  heart disease  if you often drink alcohol  kidney disease  liver disease  low adrenal gland function  lung disease, asthma, or breathing problem  seizures  stomach or intestine problems  taken an MAOI such as Marplan, Nardil, or Parnate in the last 14 days  an unusual or allergic reaction to oxycodone, other drugs, foods, dyes, or preservatives  pregnant or trying to get pregnant  breast-feeding How should I use this medicine? Take this medicine by mouth with water. Take it as directed on the prescription label at the same time every day. You can take it with or without food. If it upsets your stomach, take it with food. Keep taking it unless your health care provider tells you to stop. Some brands of this medicine, like Oxaydo, have special instructions. Ask your doctor or pharmacist if these directions are for you: Do not cut, crush or chew this medicine. Do not wet, soak, or lick the tablet before you take it. A special MedGuide will be given to you by the pharmacist with each prescription and refill. Be sure to read this information carefully each time. Talk to your pediatrician regarding the use of this medicine in children. Special care may be needed. Overdosage: If you think you have taken too much of this medicine contact a poison control center or emergency room at once. NOTE: This medicine is only for you. Do not share this medicine with others. What if I miss a dose? If you miss a dose, take it as soon as you can. If it is almost time for your next dose, take only that dose.  Do not take double or extra doses. What may interact with this medicine? Do not take this medicine with any of the following medications:  safinamide This medicine may interact with the following medications:  alcohol  antihistamines for allergy, cough, and cold  atropine  certain antivirals for HIV or hepatitis  certain antibiotics like clarithromycin, erythromycin, linezolid, rifampin  certain medicines for anxiety or sleep  certain medicines for bladder problems like oxybutynin, tolterodine  certain medicines for depression like amitriptyline, fluoxetine, sertraline  certain medicines for fungal infections like ketoconazole, itraconazole, posaconazole  certain medicines for migraine headache like almotriptan, eletriptan, frovatriptan, naratriptan, rizatriptan, sumatriptan, zolmitriptan  certain medicines for nausea or vomiting like dolasetron, granisetron, ondansetron, palonosetron  certain medicines for Parkinson's disease like benztropine, trihexyphenidyl  certain medicines for seizures like carbamazepine, phenobarbital, phenytoin, primidone  certain medicines for stomach problems like dicyclomine, hyoscyamine  certain medicines for travel sickness like scopolamine  diuretics  general anesthetics like halothane, isoflurane,  methoxyflurane, propofol  ipratropium  MAOIs like Marplan, Nardil, and Parnate  medicines that relax muscles  methylene blue  other narcotic medicines for pain or cough  phenothiazines like chlorpromazine, mesoridazine, prochlorperazine, thioridazine This list may not describe all possible interactions. Give your health care provider a list of all the medicines, herbs, non-prescription drugs, or dietary supplements you use. Also tell them if you smoke, drink alcohol, or use illegal drugs. Some items may interact with your medicine. What should I watch for while using this medicine? Tell your health care provider if your pain does not go  away, if it gets worse, or if you have new or a different type of pain. You may develop tolerance to this medicine. Tolerance means that you will need a higher dose of the medicine for pain relief. Tolerance is normal and is expected if you take this medicine for a long time. Do not suddenly stop taking your medicine because you may develop a severe reaction. Your body becomes used to the medicine. This does NOT mean you are addicted. Addiction is a behavior related to getting and using a medicine for a nonmedical reason. If you have pain, you have a medical reason to take pain medicine. Your health care provider will tell you how much medicine to take. If your health care provider wants you to stop the medicine, the dose will be slowly lowered over time to avoid any side effects. If you take other medicines that also cause drowsiness such as other narcotic pain medicines, benzodiazepines, or other medicines for sleep, you may have more side effects. Give your health care provider a list of all medicines you use. He or she will tell you how much medicine to take. Do not take more medicine than directed. Get emergency help right away if you have trouble breathing or are unusually tired or sleepy. Talk to your health care provider about naloxone and how to get it. Naloxone is an emergency medicine used for an opioid overdose. An overdose can happen if you take too much opioid. It can also happen if an opioid is taken with some other medicines or substances, such as alcohol. Know the symptoms of an overdose, such as trouble breathing, unusually tired or sleepy, or not being able to respond or wake up. Make sure to tell caregivers and close contacts where it is stored. Make sure they know how to use it. After naloxone is given, you must get emergency help right away. Naloxone is a temporary treatment. Repeat doses may be needed. You may get drowsy or dizzy. Do not drive, use machinery, or do anything that needs  mental alertness until you know how this medicine affects you. Do not stand up or sit up quickly, especially if you are an older patient. This reduces the risk of dizzy or fainting spells. Alcohol may interfere with the effect of this medicine. Avoid alcoholic drinks. This medicine will cause constipation. If you do not have a bowel movement for 3 days, call your health care provider. Your mouth may get dry. Chewing sugarless gum or sucking hard candy and drinking plenty of water may help. Contact your health care provider if the problem does not go away or is severe. What side effects may I notice from receiving this medicine? Side effects that you should report to your doctor or health care professional as soon as possible:  allergic reactions (skin rash, itching or hives; swelling of the face, lips, or tongue)  confusion  kidney injury (trouble  passing urine or change in the amount of urine)  low adrenal gland function (nausea; vomiting; loss of appetite; unusually weak or tired; dizziness; low blood pressure)  low blood pressure (dizziness; feeling faint or lightheaded, falls; unusually weak or tired)  serotonin syndrome (irritable; confusion; diarrhea; fast or irregular heartbeat; muscle twitching; stiff muscles; trouble walking; sweating; high fever; seizures; chills; vomiting)  trouble breathing Side effects that usually do not require medical attention (report to your doctor or health care professional if they continue or are bothersome):  constipation  dry mouth  nausea, vomiting  tiredness This list may not describe all possible side effects. Call your doctor for medical advice about side effects. You may report side effects to FDA at 1-800-FDA-1088. Where should I keep my medicine? Keep out of the reach of children and pets. This medicine can be abused. Keep it in a safe place to protect it from theft. Do not share it with anyone. It is only for you. Selling or giving away  this medicine is dangerous and against the law. Store at [employer] C (77 degrees F). Protect from light and moisture. Keep the container tightly closed. Get rid of any unused medicine after the expiration date. This medicine may cause harm and death if it is taken by other adults, children, or pets. It is important to get rid of the medicine as soon as you no longer need it or it is expired. You can do this in two ways:  Take the medicine to a medicine take-back program. Check with your pharmacy or law enforcement to find a location.  If you cannot return the medicine, flush it down the toilet. NOTE: This sheet is a summary. It may not cover all possible information. If you have questions about this medicine, talk to your doctor, pharmacist, or health care provider.  2021 Elsevier/Gold Standard (2020-10-17 13:26:06)   Ondansetron oral dissolving tablet What is this medicine? ONDANSETRON (on DAN se tron) is used to treat nausea and vomiting caused by chemotherapy. It is also used to prevent or treat nausea and vomiting after surgery. This medicine may be used for other purposes; ask your health care provider or pharmacist if you have questions. COMMON BRAND NAME(S): Zofran ODT What should I tell my health care provider before I take this medicine? They need to know if you have any of these conditions:  heart disease  history of irregular heartbeat  liver disease  low levels of magnesium or potassium in the blood  an unusual or allergic reaction to ondansetron, granisetron, other medicines, foods, dyes, or preservatives  pregnant or trying to get pregnant  breast-feeding How should I use this medicine? These tablets are made to dissolve in the mouth. Do not try to push the tablet through the foil backing. With dry hands, peel away the foil backing and gently remove the tablet. Place the tablet in the mouth and allow it to dissolve, then swallow. While you may take these tablets with  water, it is not necessary to do so. Talk to your pediatrician regarding the use of this medicine in children. Special care may be needed. Overdosage: If you think you have taken too much of this medicine contact a poison control center or emergency room at once. NOTE: This medicine is only for you. Do not share this medicine with others. What if I miss a dose? If you miss a dose, take it as soon as you can. If it is almost time for your next dose,  take only that dose. Do not take double or extra doses. What may interact with this medicine? Do not take this medicine with any of the following medications:  apomorphine  certain medicines for fungal infections like fluconazole, itraconazole, ketoconazole, posaconazole, voriconazole  cisapride  dronedarone  pimozide  thioridazine This medicine may also interact with the following medications:  carbamazepine  certain medicines for depression, anxiety, or psychotic disturbances  fentanyl  linezolid  MAOIs like Carbex, Eldepryl, Marplan, Nardil, and Parnate  methylene blue (injected into a vein)  other medicines that prolong the QT interval (cause an abnormal heart rhythm) like dofetilide, ziprasidone  phenytoin  rifampicin  tramadol This list may not describe all possible interactions. Give your health care provider a list of all the medicines, herbs, non-prescription drugs, or dietary supplements you use. Also tell them if you smoke, drink alcohol, or use illegal drugs. Some items may interact with your medicine. What should I watch for while using this medicine? Check with your doctor or health care professional as soon as you can if you have any sign of an allergic reaction. What side effects may I notice from receiving this medicine? Side effects that you should report to your doctor or health care professional as soon as possible:  allergic reactions like skin rash, itching or hives, swelling of the face, lips, or  tongue  breathing problems  confusion  dizziness  fast or irregular heartbeat  feeling faint or lightheaded, falls  fever and chills  loss of balance or coordination  seizures  sweating  swelling of the hands and feet  tightness in the chest  tremors  unusually weak or tired Side effects that usually do not require medical attention (report to your doctor or health care professional if they continue or are bothersome):  constipation or diarrhea  headache This list may not describe all possible side effects. Call your doctor for medical advice about side effects. You may report side effects to FDA at 1-800-FDA-1088. Where should I keep my medicine? Keep out of the reach of children. Store between 2 and 30 degrees C (36 and 86 degrees F). Throw away any unused medicine after the expiration date. NOTE: This sheet is a summary. It may not cover all possible information. If you have questions about this medicine, talk to your doctor, pharmacist, or health care provider.  2021 Elsevier/Gold Standard (2018-11-18 07:14:10)

## 2020-12-27 NOTE — Anesthesia Procedure Notes (Signed)
Procedure Name: Intubation Date/Time: 12/27/2020 11:07 AM Performed by: Karna Dupes, CRNA Pre-anesthesia Checklist: Patient identified, Emergency Drugs available, Suction available and Patient being monitored Patient Re-evaluated:Patient Re-evaluated prior to induction Oxygen Delivery Method: Circle system utilized Preoxygenation: Pre-oxygenation with 100% oxygen Induction Type: IV induction Ventilation: Mask ventilation without difficulty Laryngoscope Size: Mac and 3 Grade View: Grade I Tube type: Oral Number of attempts: 1 Airway Equipment and Method: Stylet Placement Confirmation: ETT inserted through vocal cords under direct vision,  positive ETCO2 and breath sounds checked- equal and bilateral Secured at: 21 cm Tube secured with: Tape Dental Injury: Teeth and Oropharynx as per pre-operative assessment

## 2020-12-27 NOTE — Anesthesia Postprocedure Evaluation (Signed)
Anesthesia Post Note  Patient: [name]  Procedure(s) Performed: CYSTOSCOPY WITH LEFT RETROGRADE PYELOGRAM, LEFT URETEROSCOPY AND LEFT URETERAL STENT PLACEMENT (Left Ureter)  Patient location during evaluation: PACU Anesthesia Type: General Level of consciousness: awake and oriented Pain management: satisfactory to patient Vital Signs Assessment: post-procedure vital signs reviewed and stable Respiratory status: respiratory function stable and spontaneous breathing Cardiovascular status: stable Postop Assessment: no apparent nausea or vomiting Anesthetic complications: no   No complications documented.   Last Vitals:  Vitals:   12/27/20 1230 12/27/20 1243  BP: (!) 143/69 (!) 155/65  Pulse: 77 72  Resp: 14 14  Temp:  36.8 C  SpO2: 96% 98%    Last Pain:  Vitals:   12/27/20 1243  TempSrc: Oral  PainSc: 6                  Lorin Glass

## 2020-12-27 NOTE — Anesthesia Preprocedure Evaluation (Addendum)
Anesthesia Evaluation  Patient identified by MRN, date of birth, ID band Patient awake    Reviewed: Allergy & Precautions, H&P , NPO status , Patient's Chart, lab work & pertinent test results, reviewed documented beta blocker date and time   History of Anesthesia Complications Negative for: history of anesthetic complications  Airway Mallampati: II  TM Distance: >3 FB Neck ROM: full    Dental no notable dental hx. (+) Dental Advisory Given   Pulmonary shortness of breath, sleep apnea ,    Pulmonary exam normal breath sounds clear to auscultation       Cardiovascular Exercise Tolerance: Good hypertension, negative cardio ROS   Rhythm:regular Rate:Normal     Neuro/Psych  Headaches, PSYCHIATRIC DISORDERS Depression    GI/Hepatic Neg liver ROS, GERD  Medicated,  Endo/Other  negative endocrine ROSdiabetes, Poorly Controlled, Type 2, Insulin Dependent  Renal/GU Renal disease (stones )  negative genitourinary   Musculoskeletal  (+) Arthritis , Osteoarthritis,    Abdominal   Peds  Hematology negative hematology ROS (+)   Anesthesia Other Findings   Reproductive/Obstetrics negative OB ROS                             Anesthesia Physical  Anesthesia Plan  ASA: III  Anesthesia Plan: General   Post-op Pain Management:    Induction:   PONV Risk Score and Plan: 3  Airway Management Planned: Oral ETT  Additional Equipment:   Intra-op Plan:   Post-operative Plan: Extubation in OR  Informed Consent: I have reviewed the patients History and Physical, chart, labs and discussed the procedure including the risks, benefits and alternatives for the proposed anesthesia with the patient or authorized representative who has indicated his/her understanding and acceptance.     Dental Advisory Given and Dental advisory given  Plan Discussed with: CRNA and Surgeon  Anesthesia Plan Comments:         Anesthesia Quick Evaluation

## 2020-12-27 NOTE — Interval H&P Note (Signed)
History and Physical Interval Note:  12/27/2020 10:27 AM  [name]  has presented today for surgery, with the diagnosis of left renal calculus.  The various methods of treatment have been discussed with the patient and family. After consideration of risks, benefits and other options for treatment, the patient has consented to  Procedure(s): CYSTOSCOPY WITH RETROGRADE PYELOGRAM, URETEROSCOPY AND STENT PLACEMENT (Left) HOLMIUM LASER APPLICATION (Left) as a surgical intervention.  The patient's history has been reviewed, patient examined, no change in status, stable for surgery.  I have reviewed the patient's chart and labs.  Questions were answered to the patient's satisfaction.     Wilkie Aye

## 2020-12-29 ENCOUNTER — Encounter (HOSPITAL_COMMUNITY): Payer: Self-pay | Admitting: Urology

## 2021-01-02 LAB — CALCULI, WITH PHOTOGRAPH (CLINICAL LAB)
Calcium Oxalate Dihydrate: 50 %
Calcium Oxalate Monohydrate: 50 %
Weight Calculi: 8 mg

## 2021-01-03 ENCOUNTER — Ambulatory Visit: Payer: Commercial Managed Care - PPO | Admitting: Gastroenterology

## 2021-01-06 ENCOUNTER — Ambulatory Visit: Payer: Commercial Managed Care - PPO | Admitting: Urology

## 2021-01-13 ENCOUNTER — Ambulatory Visit (INDEPENDENT_AMBULATORY_CARE_PROVIDER_SITE_OTHER): Payer: Commercial Managed Care - PPO | Admitting: Urology

## 2021-01-13 ENCOUNTER — Ambulatory Visit (HOSPITAL_COMMUNITY)
Admission: RE | Admit: 2021-01-13 | Discharge: 2021-01-13 | Disposition: A | Discharge status: Home / Self Care | Payer: Commercial Managed Care - PPO | Source: Ambulatory Visit | Attending: Urology | Admitting: Urology

## 2021-01-13 ENCOUNTER — Encounter: Payer: Self-pay | Admitting: Urology

## 2021-01-13 ENCOUNTER — Ambulatory Visit: Payer: Commercial Managed Care - PPO | Admitting: Urology

## 2021-01-13 ENCOUNTER — Other Ambulatory Visit: Payer: Self-pay

## 2021-01-13 ENCOUNTER — Telehealth: Payer: Self-pay | Admitting: Urology

## 2021-01-13 VITALS — BP 120/72 | HR 79 | Temp 98.4°F | Ht <= 58 in | Wt 205.0 lb

## 2021-01-13 DIAGNOSIS — N200 Calculus of kidney: Secondary | ICD-10-CM | POA: Diagnosis present

## 2021-01-13 DIAGNOSIS — M549 Dorsalgia, unspecified: Secondary | ICD-10-CM

## 2021-01-13 DIAGNOSIS — R1012 Left upper quadrant pain: Secondary | ICD-10-CM | POA: Diagnosis not present

## 2021-01-13 LAB — URINALYSIS, ROUTINE W REFLEX MICROSCOPIC
Bilirubin, UA: NEGATIVE
Glucose, UA: NEGATIVE
Ketones, UA: NEGATIVE
Leukocytes,UA: NEGATIVE
Nitrite, UA: NEGATIVE
Protein,UA: NEGATIVE
RBC, UA: NEGATIVE
Specific Gravity, UA: 1.015 (ref 1.005–1.030)
Urobilinogen, Ur: 0.2 mg/dL (ref 0.2–1.0)
pH, UA: 7 (ref 5.0–7.5)

## 2021-01-13 MED ORDER — CYCLOBENZAPRINE HCL 5 MG PO TABS: 5.0000 mg | ORAL_TABLET | Freq: Three times a day (TID) | ORAL | 1 refills | Status: DC | PRN

## 2021-01-13 NOTE — Patient Instructions (Signed)

## 2021-01-13 NOTE — Progress Notes (Signed)
01/13/2021 12:53 PM   [name] [birth year] [id number]  Referring provider: Benita Stabile, MD [address] [name],  [place] [address]  Left flank pain  HPI: [name] is a 55yo here for folllowup for dorsalgia and left flank pain. She underwent left ureteroscopic stone extraction 2 weeks ago. She has intermittent, moderate to severe left flank pain that radiates to her leg. No other associated symptoms. No exacerbating events. Pain was alleviated with flexeril. She has stable urinary urgency, frequency and urge incontinence   PMH: Past Medical History:  Diagnosis Date  . Diabetes mellitus    insulin pump 2013  . Hypercholesteremia   . Hypertension   . Kidney stone   . Neuropathy   . Obesity   . Obstructive sleep apnea   . Palpitations   . Shortness of breath     Surgical History: Past Surgical History:  Procedure Laterality Date  . BIOPSY  06/23/2020   Procedure: BIOPSY;  Surgeon: [name] Ade, MD;  [facility];  Service: Endoscopy;;  . CARDIAC CATHETERIZATION  12/11/2012   normal coronary arteries  . CESAREAN SECTION  1994;2000   [facility]  . CHOLECYSTECTOMY  1992  . COLONOSCOPY WITH PROPOFOL N/A 06/23/2020   Procedure: COLONOSCOPY WITH PROPOFOL;  Surgeon: [name] Ade, MD;  [facility];  Service: Endoscopy;  Laterality: N/A;  7:30am  . CYSTOSCOPY W/ URETERAL STENT PLACEMENT  05/08/2012   Procedure: CYSTOSCOPY WITH RETROGRADE PYELOGRAM/URETERAL STENT PLACEMENT;  Surgeon: [name] Barban, MD;  Location: [facility];  Service: Urology;  Laterality: Right;  . CYSTOSCOPY WITH RETROGRADE PYELOGRAM, URETEROSCOPY AND STENT PLACEMENT Right 05/04/2020   Procedure: CYSTOSCOPY WITH RIGHT RETROGRADE PYELOGRAM, RIGHT URETEROSCOPY AND RIGHT URETERAL STENT EXCHANGE;  Surgeon: [name] Gauze, MD;  Location: [facility];  Service: Urology;  Laterality: Right;  . CYSTOSCOPY WITH RETROGRADE PYELOGRAM, URETEROSCOPY AND STENT PLACEMENT Left 08/08/2020    Procedure: CYSTOSCOPY WITH RETROGRADE PYELOGRAM, URETEROSCOPY WITH LASER  AND STENT PLACEMENT;  Surgeon: [name] Gauze, MD;  Location: [facility];  Service: Urology;  Laterality: Left;  . CYSTOSCOPY WITH RETROGRADE PYELOGRAM, URETEROSCOPY AND STENT PLACEMENT Right 11/08/2020   Procedure: CYSTOSCOPY WITH RIGHT RETROGRADE PYELOGRAM, RIGHT URETEROSCOPY AND STENT PLACEMENT;  Surgeon: [name] Gauze, MD;  Location: [facility];  Service: Urology;  Laterality: Right;  . CYSTOSCOPY WITH RETROGRADE PYELOGRAM, URETEROSCOPY AND STENT PLACEMENT Left 12/27/2020   Procedure: CYSTOSCOPY WITH LEFT RETROGRADE PYELOGRAM, LEFT URETEROSCOPY AND LEFT URETERAL STENT PLACEMENT;  Surgeon: [name] Gauze, MD;  Location: [facility];  Service: Urology;  Laterality: Left;  . CYSTOSCOPY WITH STENT PLACEMENT Right 02/25/2020   Procedure: CYSTOSCOPY WITH RIGHT RETROGRADE RIGHT STENT PLACEMENT;  Surgeon: Jerilee Field, MD;  Location: [facility];  Service: Urology;  Laterality: Right;  . ESOPHAGOGASTRODUODENOSCOPY (EGD) WITH PROPOFOL N/A 06/23/2020   Procedure: ESOPHAGOGASTRODUODENOSCOPY (EGD) WITH PROPOFOL;  Surgeon: [name] Ade, MD;  [facility];  Service: Endoscopy;  Laterality: N/A;  . EXTRACORPOREAL SHOCK WAVE LITHOTRIPSY Right 10/11/2020   Procedure: EXTRACORPOREAL SHOCK WAVE LITHOTRIPSY (ESWL);  Surgeon: [name] Gauze, MD;  Location: [facility];  Service: Urology;  Laterality: Right;  . FOOT SURGERY Right March, 2013   [facility]-removal of bone spur  . HOLMIUM LASER APPLICATION  05/04/2020   Procedure: HOLMIUM LASER LITHOTRIPSY RIGHT URETERAL CALCULUS;  Surgeon: [name] Gauze, MD;  [facility]: [facility];  Service: Urology;;  . HYSTEROSCOPY WITH THERMACHOICE  04/25/2012   Procedure: HYSTEROSCOPY WITH THERMACHOICE;  Surgeon: [name] Dyke  Alvarado Hidden, MD;  Location: [facility];  Service: Gynecology;  Laterality: N/A;  total therapy time= 11 minutes 42 seconds; 34 ml D5w  in and 34 ml D5w out; temp =87 degrees F  .  LEFT HEART CATHETERIZATION WITH CORONARY ANGIOGRAM N/A 12/11/2012   Procedure: LEFT HEART CATHETERIZATION WITH CORONARY ANGIOGRAM;  Surgeon: [name] Gess, MD;  Location: [facility] CATH LAB;  Service: Cardiovascular;  Laterality: N/A;  . [name] DILATION N/A 06/23/2020   Procedure: [name] DILATION;  Surgeon: [name] Ade, MD;  [facility];  Service: Endoscopy;  Laterality: N/A;  . Sinus sergery  1988   [name]  . STONE EXTRACTION WITH BASKET Right 11/08/2020   Procedure: STONE EXTRACTION WITH BASKET;  Surgeon: [name] Gauze, MD;  [facility]: [facility];  Service: Urology;  Laterality: Right;  . UMBILICAL HERNIA REPAIR N/A 03/25/2014   Procedure: UMBILICAL HERNIA REPAIR;  Surgeon: [name] Hatcher, MD;  Location: [facility];  Service: General;  Laterality: N/A;  . uterine ablation      Home Medications:  Allergies as of 01/13/2021      Reactions   Ciprofloxacin Hives, Swelling   Penicillins Anaphylaxis, Rash   Codeine Other (See Comments)   had hallicunations   Morphine Nausea And Vomiting, Other (See Comments)   recieved in ED due to [facility] and had multple doses - gave visual hallucinations and vomitting.   Sulfonamide Derivatives Other (See Comments)   REACTION: Unsure - childhood allergy   Vancomycin Itching      Medication List       Accurate as of January 13, 2021 12:53 PM. If you have any questions, ask your nurse or doctor.        albuterol 108 (90 Base) MCG/ACT inhaler Commonly known as: VENTOLIN HFA Inhale 2 puffs into the lungs every 6 (six) hours as needed for wheezing or shortness of breath.   ALPRAZolam 0.5 MG tablet Commonly known as: XANAX Take 0.5 mg by mouth at bedtime.   ARIPiprazole 2 MG tablet Commonly known as: ABILIFY Take 2 mg by mouth at bedtime.   buPROPion 150 MG 24 hr tablet Commonly known as: WELLBUTRIN XL Take 150 mg by mouth daily.   cyclobenzaprine 5 MG tablet Commonly known as: FLEXERIL Take 1 tablet (5 mg total) by mouth 3  (three) times daily as needed for muscle spasms.   Desvenlafaxine Succinate ER 25 MG Tb24 Take 1 tablet by mouth daily. What changed: Another medication with the same name was removed. Continue taking this medication, and follow the directions you see here. Changed by: Wilkie Aye, MD   furosemide 20 MG tablet Commonly known as: LASIX Take 40 mg by mouth 2 (two) times daily.   gabapentin 600 MG tablet Commonly known as: NEURONTIN Take 600 mg by mouth 2 (two) times daily.   HumaLOG 100 UNIT/ML injection Generic drug: insulin lispro Inject 3.5 Units into the skin continuous. 3.5 units every 1 hr-insulin pump   ibuprofen 200 MG tablet Commonly known as: ADVIL Take 400 mg by mouth every 6 (six) hours as needed for headache or moderate pain.   omeprazole 40 MG capsule Commonly known as: PRILOSEC Take 40 mg by mouth daily.   ondansetron 4 MG tablet Commonly known as: ZOFRAN Take 1 tablet (4 mg total) by mouth every 8 (eight) hours as needed.   oxybutynin 5 MG tablet Commonly known as: DITROPAN Take 5 mg by mouth at bedtime.   oxyCODONE-acetaminophen 5-325 MG tablet Commonly known as: Percocet Take 1 tablet  by mouth every 4 (four) hours as needed for severe pain.   pantoprazole 40 MG tablet Commonly known as: PROTONIX Take 40 mg by mouth 2 (two) times daily.   Potassium 99 MG Tabs Take 99 mg by mouth daily.   simvastatin 40 MG tablet Commonly known as: ZOCOR Take 40 mg by mouth daily.   tamsulosin 0.4 MG Caps capsule Commonly known as: FLOMAX TAKE 1 CAPSULE(0.4 MG) BY MOUTH DAILY AFTER AND SUPPER What changed:   how much to take  how to take this  when to take this       Allergies:  Allergies  Allergen Reactions  . Ciprofloxacin Hives and Swelling  . Penicillins Anaphylaxis and Rash  . Codeine Other (See Comments)    had hallicunations  . Morphine Nausea And Vomiting and Other (See Comments)    recieved in ED due to [facility] and had multple  doses - gave visual hallucinations and vomitting.  . Sulfonamide Derivatives Other (See Comments)    REACTION: Unsure - childhood allergy  . Vancomycin Itching    Family History: Family History  Problem Relation Age of Onset  . Diabetes Mother   . Depression Paternal Grandmother   . Depression Paternal Grandfather   . Heart disease Other   . Cancer Other   . Diabetes Other   . Achalasia Other   . Anesthesia problems Neg Hx   . Hypotension Neg Hx   . Malignant hyperthermia Neg Hx   . Pseudochol deficiency Neg Hx     Social History:  reports that she has never smoked. She has never used smokeless tobacco. She reports that she does not drink alcohol and does not use drugs.  ROS: All other review of systems were reviewed and are negative except what is noted above in HPI  Physical Exam: BP 120/72   Pulse 79   Temp 98.4 F (36.9 C)   Ht  (1.422 m)   Wt 205 lb (93 kg)   LMP 07/16/2012   BMI 45.96 kg/m   Constitutional:  Alert and oriented, No acute distress. HEENT: [place] AT, moist mucus membranes.  Trachea midline, no masses. Cardiovascular: No clubbing, cyanosis, or edema. Respiratory: Normal respiratory effort, no increased work of breathing. GI: Abdomen is soft, nontender, nondistended, no abdominal masses GU: No CVA tenderness.  Lymph: No cervical or inguinal lymphadenopathy. Skin: No rashes, bruises or suspicious lesions. Neurologic: Grossly intact, no focal deficits, moving all 4 extremities. Psychiatric: Normal mood and affect.  Laboratory Data: Lab Results  Component Value Date   WBC 11.4 (H) 05/02/2020   HGB 12.6 05/04/2020   HCT 37.0 05/04/2020   MCV 86.4 05/02/2020   PLT 384 05/02/2020    Lab Results  Component Value Date   CREATININE 0.60 12/26/2020    No results found for: PSA  No results found for: TESTOSTERONE  Lab Results  Component Value Date   HGBA1C 7.6 (H) 11/07/2020    Urinalysis    Component Value Date/Time   COLORURINE  AMBER (A) 04/04/2020 0309   APPEARANCEUR Clear 11/16/2020 0831   LABSPEC 1.020 04/04/2020 0309   PHURINE 6.0 04/04/2020 0309   GLUCOSEU Negative 11/16/2020 0831   HGBUR LARGE (A) 04/04/2020 0309   HGBUR moderate 08/31/2008 0908   BILIRUBINUR neg 12/14/2020 0835   BILIRUBINUR Negative 11/16/2020 0831   KETONESUR NEGATIVE 04/04/2020 0309   PROTEINUR Negative 12/14/2020 0835   PROTEINUR Negative 11/16/2020 0831   PROTEINUR 100 (A) 04/04/2020 0309   UROBILINOGEN 0.2 12/14/2020 1478  UROBILINOGEN 0.2 09/21/2014 1055   NITRITE neg 12/14/2020 0835   NITRITE Negative 11/16/2020 0831   NITRITE NEGATIVE 04/04/2020 0309   LEUKOCYTESUR Trace (A) 12/14/2020 0835   LEUKOCYTESUR Negative 11/16/2020 0831   LEUKOCYTESUR SMALL (A) 04/04/2020 0309    Lab Results  Component Value Date   LABMICR Comment 11/16/2020   WBCUA 11-30 (A) 10/31/2020   LABEPIT 0-10 10/31/2020   BACTERIA Many (A) 10/31/2020    Pertinent Imaging:  Results for orders placed during the hospital encounter of 10/31/20  DG Abd 1 View  Narrative CLINICAL DATA:  Status post lithotripsy  EXAM: ABDOMEN - 1 VIEW  COMPARISON:  10/10/2020  FINDINGS: Scattered large and small bowel gas is noted. Scattered calculi are noted over the lower pole of the right kidney. The largest of these measures approximately 6 mm. No definitive ureteral stones are noted. Degenerative changes of lumbar spine are seen.  IMPRESSION: Persistent right lower pole renal stones. The largest of these measures approximately 6 mm.   Electronically Signed By: Alcide Clever M.D. On: 10/31/2020 11:38  No results found for this or any previous visit.  No results found for this or any previous visit.  No results found for this or any previous visit.  Results for orders placed during the hospital encounter of 12/13/20  Ultrasound renal complete  Narrative CLINICAL DATA:  Nephrolithiasis.  EXAM: RENAL / URINARY TRACT ULTRASOUND  COMPLETE  COMPARISON:  10/31/2020 and prior.  FINDINGS: Right Kidney:  Renal measurements: 11.8 x 5.8 x 7.1 cm = volume: 253.2 mL. Echogenicity within normal limits. No mass visualized. Shadowing calculi measuring up to 10.5 mm. Mild upper pole caliectasis.  Left Kidney:  Renal measurements: 12.4 x 6.0 x 5.2 cm = volume: 202.7 mL. Echogenicity within normal limits. No mass or hydronephrosis visualized. Shadowing calculi measuring up to 9.7 mm.  Bladder:  Appears normal for degree of bladder distention. Bilateral ureteral jets visualized.  Other:  None.  IMPRESSION: Bilateral nephrolithiasis.  Mild right upper pole caliectasis.   Electronically Signed By: Stana Bunting M.D. On: 12/13/2020 15:55  No results found for this or any previous visit.  No results found for this or any previous visit.  Results for orders placed in visit on 10/31/20  CT RENAL STONE STUDY  Narrative CLINICAL DATA:  Left flank pain with nausea and vomiting  EXAM: CT ABDOMEN AND PELVIS WITHOUT CONTRAST  TECHNIQUE: Multidetector CT imaging of the abdomen and pelvis was performed following the standard protocol without IV contrast.  COMPARISON:  April 04, 2020 CT abdomen and pelvis; October 31, 2020 abdominal radiograph  FINDINGS: Lower chest: Lung bases are clear.  Hepatobiliary: No focal liver lesions are appreciable on this noncontrast enhanced study. Gallbladder is absent. There is no appreciable biliary duct dilatation.  Pancreas: There is no pancreatic mass or inflammatory focus.  Spleen: No splenic lesions are evident.  Adrenals/Urinary Tract: Adrenals bilaterally appear normal. There is no evident renal mass on either side. There is slight hydronephrosis on the right. On the right, there is scarring in the upper pole kidney region. There is a 1 mm calculus in the upper pole the right kidney. There is a calculus in the lower pole of the right kidney measuring 8 x 6  mm. There is a 4 x 3 mm calculus with an adjacent 1 mm calculus in the lower pole of the right kidney as well. There is a 3 x 3 mm calculus in the lower pole left kidney. There is a 2  mm calculus as well as a 1 mm calculus in the upper to mid left kidney. Tiny calculi elsewhere in the mid kidney noted on the left. No ureteral calculus is appreciable on either side. The urinary bladder is midline with wall thickness within normal limits.  Stomach/Bowel: There is no appreciable bowel wall or mesenteric thickening. There are sigmoid diverticula without diverticulitis. The terminal ileum appears normal. There is no evident bowel obstruction. There is no free air or portal venous air.  Vascular/Lymphatic: There is no abdominal aortic aneurysm. There are occasional foci of aortic atherosclerosis. No adenopathy is appreciable in the abdomen or pelvis.  Reproductive: The uterus is anteverted.  No evident pelvic mass.  Other: Appendix appears normal. No evident abscess or ascites in the abdomen or pelvis. There is mild fat in the umbilicus with a surgical clip in this area.  Musculoskeletal: There are foci of degenerative change in the lumbar spine. There are no blastic or lytic bone lesions. No intramuscular lesions are evident.  IMPRESSION: 1. Slight hydronephrosis on the right without obstructing lesion appreciable on the right. Particular, no right ureteral calculus. Question recent calculus passage on the right. A degree of pyelonephritis potentially could present in this manner.  2. Nonobstructing calculi on each side, larger and more notable on the right than on the left.  3.  Scarring upper pole right kidney.  4. No evident bowel wall thickening or bowel obstruction. No abscess in the abdomen pelvis. Appendix appears normal. Occasional sigmoid diverticula without diverticulitis.  5.  Aortic Atherosclerosis (ICD10-I70.0).  6.  Gallbladder absent.   Electronically  Signed By: Bretta Bang III M.D. On: 10/31/2020 11:38   Assessment & Plan:    1. Kidney stones - Ultrasound renal complete  2. Left upper quadrant abdominal pain -renal US, will call with results - Urinalysis, Routine w reflex microscopic  3. Dorsalgia -flexeril 5mg  TID PRN   Return in about 2 weeks (around 01/27/2021).  01/29/2021, MD  [facility] [place]

## 2021-01-13 NOTE — Telephone Encounter (Signed)
Patient called the office this afternoon.  She was seen today and wanting to know the results of her renal ultrasound.    She is requesting a call.

## 2021-01-13 NOTE — Progress Notes (Signed)
Urological Symptom Review  Patient is experiencing the following symptoms: Hard to postpone urination Leakage of urine Stream starts and stops Trouble starting stream Have to strain to urinate Weak stream  Kidney stones   Review of Systems  Gastrointestinal (upper)  : Indigestion/heartburn  Gastrointestinal (lower) : Constipation  Constitutional : Negative for symptoms  Skin: Negative for skin symptoms  Eyes: Negative for eye symptoms  Ear/Nose/Throat : Negative for Ear/Nose/Throat symptoms  Hematologic/Lymphatic: Negative for Hematologic/Lymphatic symptoms  Cardiovascular : Negative for cardiovascular symptoms  Respiratory : Negative for respiratory symptoms  Endocrine: Negative for endocrine symptoms  Musculoskeletal: Negative for musculoskeletal symptoms  Neurological: Negative for neurological symptoms  Psychologic: Depression Anxiety

## 2021-01-16 ENCOUNTER — Telehealth: Payer: Self-pay

## 2021-01-16 NOTE — Telephone Encounter (Signed)
Pt called and left a message to have nurse call her back. Left message for her to call back.

## 2021-01-18 ENCOUNTER — Encounter: Payer: Self-pay | Admitting: *Deleted

## 2021-01-18 ENCOUNTER — Telehealth: Payer: Self-pay | Admitting: *Deleted

## 2021-01-18 ENCOUNTER — Other Ambulatory Visit: Payer: Self-pay

## 2021-01-18 ENCOUNTER — Ambulatory Visit: Payer: Commercial Managed Care - PPO | Admitting: Gastroenterology

## 2021-01-18 ENCOUNTER — Encounter: Payer: Self-pay | Admitting: Gastroenterology

## 2021-01-18 VITALS — BP 135/74 | HR 77 | Temp 96.8°F | Ht <= 58 in | Wt 224.2 lb

## 2021-01-18 DIAGNOSIS — R1310 Dysphagia, unspecified: Secondary | ICD-10-CM | POA: Diagnosis not present

## 2021-01-18 DIAGNOSIS — R1013 Epigastric pain: Secondary | ICD-10-CM

## 2021-01-18 DIAGNOSIS — K5900 Constipation, unspecified: Secondary | ICD-10-CM | POA: Insufficient documentation

## 2021-01-18 DIAGNOSIS — R194 Change in bowel habit: Secondary | ICD-10-CM | POA: Insufficient documentation

## 2021-01-18 NOTE — Telephone Encounter (Signed)
Called pt. She is aware GES scheduled for 2/21. She states she is unable to do this date and will call nuc med to r/s.

## 2021-01-18 NOTE — Progress Notes (Signed)
Referring Provider: Benita Stabile, MD Primary Care Physician:  Benita Stabile, MD Primary GI: Dr. Jena Gauss   Chief Complaint  Patient presents with  . burping  . Dysphagia    HPI:   [name] is a 55 y.o. female presenting today with a history of dysphagia, GERD, recent EGD July 2021 with normal esophagus, reactive gastropathy, and empiric dilation. Colonoscopy at that time with inadequate prep and will need early interval this year.   No appetite. Belching recurrent a few months ago. Has had this in the past but now recurrent. Early satiety. Associated nausea. No vomiting. Usually moreso epigastric discomfort that is intermittent and not necessarily related to eating. Nothing relieves. Now taking omeprazole 40 mg BID. Increased to BID last week. No carbonated beverages. Drinks lots of water. No appetite. Tries to avoid NSAIDs.   Last A1c was 6.2. Oxycodone for kidney stones. Has been checking sugars about 3 times a day. Still with esophageal dysphagia. Cuts in small bites but still feels like it is getting stuck. Doesn't feel like significant improvement. Poor appetite.   Sometimes constipation. Takes stool softener as needed. Stool will be hard. Interested in trying something to help.     Past Medical History:  Diagnosis Date  . Diabetes mellitus    insulin pump 2013  . Hypercholesteremia   . Hypertension   . Kidney stone   . Neuropathy   . Obesity   . Obstructive sleep apnea   . Palpitations   . Shortness of breath     Past Surgical History:  Procedure Laterality Date  . BIOPSY  06/23/2020   Procedure: BIOPSY;  Surgeon: Corbin Ade, MD;  Location: [facility];  Service: Endoscopy;;  . CARDIAC CATHETERIZATION  12/11/2012   normal coronary arteries  . CESAREAN SECTION  1994;2000   [facility]  . CHOLECYSTECTOMY  1992  . COLONOSCOPY WITH PROPOFOL N/A 06/23/2020   large amount of stool in entire colon, prep inadequate.   . CYSTOSCOPY W/ URETERAL STENT PLACEMENT   05/08/2012   Procedure: CYSTOSCOPY WITH RETROGRADE PYELOGRAM/URETERAL STENT PLACEMENT;  Surgeon: Ky Barban, MD;  Location: [facility];  Service: Urology;  Laterality: Right;  . CYSTOSCOPY WITH RETROGRADE PYELOGRAM, URETEROSCOPY AND STENT PLACEMENT Right 05/04/2020   Procedure: CYSTOSCOPY WITH RIGHT RETROGRADE PYELOGRAM, RIGHT URETEROSCOPY AND RIGHT URETERAL STENT EXCHANGE;  Surgeon: Malen Gauze, MD;  Location: [facility];  Service: Urology;  Laterality: Right;  . CYSTOSCOPY WITH RETROGRADE PYELOGRAM, URETEROSCOPY AND STENT PLACEMENT Left 08/08/2020   Procedure: CYSTOSCOPY WITH RETROGRADE PYELOGRAM, URETEROSCOPY WITH LASER  AND STENT PLACEMENT;  Surgeon: Malen Gauze, MD;  Location: [facility];  Service: Urology;  Laterality: Left;  . CYSTOSCOPY WITH RETROGRADE PYELOGRAM, URETEROSCOPY AND STENT PLACEMENT Right 11/08/2020   Procedure: CYSTOSCOPY WITH RIGHT RETROGRADE PYELOGRAM, RIGHT URETEROSCOPY AND STENT PLACEMENT;  Surgeon: Malen Gauze, MD;  Location: [facility];  Service: Urology;  Laterality: Right;  . CYSTOSCOPY WITH RETROGRADE PYELOGRAM, URETEROSCOPY AND STENT PLACEMENT Left 12/27/2020   Procedure: CYSTOSCOPY WITH LEFT RETROGRADE PYELOGRAM, LEFT URETEROSCOPY AND LEFT URETERAL STENT PLACEMENT;  Surgeon: Malen Gauze, MD;  Location: [facility];  Service: Urology;  Laterality: Left;  . CYSTOSCOPY WITH STENT PLACEMENT Right 02/25/2020   Procedure: CYSTOSCOPY WITH RIGHT RETROGRADE RIGHT STENT PLACEMENT;  Surgeon: Jerilee Field, MD;  Location: [facility];  Service: Urology;  Laterality: Right;  . ESOPHAGOGASTRODUODENOSCOPY (EGD) WITH PROPOFOL N/A 06/23/2020   Normal esophagus, multiple localized erosions were found in gastric  antrum s/p biopsy, normal duodenum. Empiric dilation.Reactive gastropathy with erosions, negative H.pylori, no dysplasia.    Marland Kitchen EXTRACORPOREAL SHOCK WAVE LITHOTRIPSY Right 10/11/2020   Procedure: EXTRACORPOREAL SHOCK WAVE LITHOTRIPSY (ESWL);  Surgeon: Malen Gauze, MD;  Location: [facility];  Service: Urology;  Laterality: Right;  . FOOT SURGERY Right March, 2013   [facility]-removal of bone spur  . HOLMIUM LASER APPLICATION  05/04/2020   Procedure: HOLMIUM LASER LITHOTRIPSY RIGHT URETERAL CALCULUS;  Surgeon: Malen Gauze, MD;  Location: [facility];  Service: Urology;;  . HYSTEROSCOPY WITH THERMACHOICE  04/25/2012   Procedure: HYSTEROSCOPY WITH THERMACHOICE;  Surgeon: Lazaro Arms, MD;  Location: [facility];  Service: Gynecology;  Laterality: N/A;  total therapy time= 11 minutes 42 seconds; 34 ml D5w  in and 34 ml D5w out; temp =87 degrees F  . LEFT HEART CATHETERIZATION WITH CORONARY ANGIOGRAM N/A 12/11/2012   Procedure: LEFT HEART CATHETERIZATION WITH CORONARY ANGIOGRAM;  Surgeon: Runell Gess, MD;  Location: [facility] CATH [facility];  Service: Cardiovascular;  Laterality: N/A;  . [name] DILATION N/A 06/23/2020   Procedure: [name] DILATION;  Surgeon: Corbin Ade, MD;  [facility];  Service: Endoscopy;  Laterality: N/A;  . Sinus sergery  1988   [place]  . STONE EXTRACTION WITH BASKET Right 11/08/2020   Procedure: STONE EXTRACTION WITH BASKET;  Surgeon: Malen Gauze, MD;  Location: [facility];  Service: Urology;  Laterality: Right;  . UMBILICAL HERNIA REPAIR N/A 03/25/2014   Procedure: UMBILICAL HERNIA REPAIR;  Surgeon: Marlane Hatcher, MD;  Location: [facility];  Service: General;  Laterality: N/A;  . uterine ablation      Current Outpatient Medications  Medication Sig Dispense Refill  . albuterol (VENTOLIN HFA) 108 (90 Base) MCG/ACT inhaler Inhale 2 puffs into the lungs every 6 (six) hours as needed for wheezing or shortness of breath.     . ALPRAZolam (XANAX) 0.5 MG tablet Take 0.5 mg by mouth at bedtime.   2  . ARIPiprazole (ABILIFY) 2 MG tablet Take 2 mg by mouth at bedtime.     Marland Kitchen buPROPion (WELLBUTRIN XL) 150 MG 24 hr tablet Take 150 mg by mouth daily.   2  . cyclobenzaprine (FLEXERIL) 5 MG tablet Take 1 tablet (5 mg total) by mouth  3 (three) times daily as needed for muscle spasms. 30 tablet 1  . Desvenlafaxine Succinate ER 25 MG TB24 Take 1 tablet by mouth daily.    . furosemide (LASIX) 20 MG tablet Take 40 mg by mouth 2 (two) times daily.     Marland Kitchen gabapentin (NEURONTIN) 600 MG tablet Take 600 mg by mouth 2 (two) times daily.    Marland Kitchen HUMALOG 100 UNIT/ML injection Inject 3.5 Units into the skin continuous. 3.5 units every 1 hr-insulin pump    . ibuprofen (ADVIL) 200 MG tablet Take 400 mg by mouth every 6 (six) hours as needed for headache or moderate pain.    Marland Kitchen omeprazole (PRILOSEC) 40 MG capsule Take 40 mg by mouth 2 (two) times daily.    . ondansetron (ZOFRAN) 4 MG tablet Take 1 tablet (4 mg total) by mouth every 8 (eight) hours as needed. 30 tablet 0  . oxybutynin (DITROPAN) 5 MG tablet Take 5 mg by mouth at bedtime.     Marland Kitchen oxyCODONE-acetaminophen (PERCOCET) 5-325 MG tablet Take 1 tablet by mouth every 4 (four) hours as needed for severe pain. 30 tablet 0  . Potassium 99 MG TABS Take 99 mg by mouth daily.    Marland Kitchen  simvastatin (ZOCOR) 40 MG tablet Take 40 mg by mouth daily.     . tamsulosin (FLOMAX) 0.4 MG CAPS capsule TAKE 1 CAPSULE(0.4 MG) BY MOUTH DAILY AFTER AND SUPPER (Patient taking differently: Take 0.4 mg by mouth daily after supper. TAKE 1 CAPSULE(0.4 MG) BY MOUTH DAILY AFTER AND SUPPER) 30 capsule 0   No current facility-administered medications for this visit.    Allergies as of 01/18/2021 - Review Complete 01/18/2021  Allergen Reaction Noted  . Ciprofloxacin Hives and Swelling 04/25/2012  . Penicillins Anaphylaxis and Rash 08/31/2008  . Codeine Other (See Comments) 08/31/2008  . Morphine Nausea And Vomiting and Other (See Comments) 08/31/2008  . Sulfonamide derivatives Other (See Comments) 08/31/2008  . Vancomycin Itching 02/19/2014    Family History  Problem Relation Age of Onset  . Diabetes Mother   . Depression Paternal Grandmother   . Depression Paternal Grandfather   . Heart disease Other   . Cancer  Other   . Diabetes Other   . Achalasia Other   . Anesthesia problems Neg Hx   . Hypotension Neg Hx   . Malignant hyperthermia Neg Hx   . Pseudochol deficiency Neg Hx     Social History   Socioeconomic History  . Marital status: Married    Spouse name: Not on file  . Number of children: 2  . Years of education: college  . Highest education level: Not on file  Occupational History  . Occupation: CNA    Employer: Audiological scientist: [employer]  Tobacco Use  . Smoking status: Never Smoker  . Smokeless tobacco: Never Used  Substance and Sexual Activity  . Alcohol use: No  . Drug use: No  . Sexual activity: Yes    Partners: Male    Comment: ablation  Other Topics Concern  . Not on file  Social History Narrative   Regular exercise: walks    Caffeine use: not regularly   Employed and full time student at [employer]   Social Determinants of Health   Financial Resource Strain: Not on file  Food Insecurity: Not on file  Transportation Needs: Not on file  Physical Activity: Not on file  Stress: Not on file  Social Connections: Not on file    Review of Systems: Gen: Denies fever, chills, anorexia. Denies fatigue, weakness, weight loss.  CV: Denies chest pain, palpitations, syncope, peripheral edema, and claudication. Resp: Denies dyspnea at rest, cough, wheezing, coughing up blood, and pleurisy. GI: see HPI Derm: Denies rash, itching, dry skin Psych: Denies depression, anxiety, memory loss, confusion. No homicidal or suicidal ideation.  Heme: Denies bruising, bleeding, and enlarged lymph nodes.  Physical Exam: BP 135/74   Pulse 77   Temp (!) 96.8 F (36 C) (Temporal)   Ht 4\' 8"  (1.422 m)   Wt 224 lb 3.2 oz (101.7 kg)   LMP 07/16/2012   BMI 50.26 kg/m  General:   Alert and oriented. No distress noted. Pleasant and cooperative.  Head:  Normocephalic and atraumatic. Eyes:  Conjuctiva clear without scleral icterus. Mouth:  Mask in place Abdomen:  +BS, soft,  non-tender and non-distended. No rebound or guarding. No HSM or masses noted. Msk:  Symmetrical without gross deformities. Normal posture. Extremities:  Without edema. Neurologic:  Alert and  oriented x4 Psych:  Alert and cooperative. Normal mood and affect.  ASSESSMENT: [name] is a 55 y.o. female presenting today with history of GERD, dysphagia, now with dyspepsia, persistent dysphagia, and constipation.  Dyspepsia: nausea,  early satiety, belching. PPI just increased to BID per PCP. Known history of diabetes. Will order GES. Suspect may have delayed gastric emptying. Gallbladder absent.   Dysphagia: empiric dilation in July 2021. Will order BPE.  Constipation: poor prep at time of colonoscopy actually last year. Will trial Linzess 145 mcg once daily. Needs early interval colonoscopy arranged at next visit.    PLAN:  Linzess 145 mcg daily samples provided Prilosec BID GES  BPE Return in 3-4 months Arrange colonoscopy at next appt  Gelene Mink, PhD, ANP-BC [facility]

## 2021-01-18 NOTE — Patient Instructions (Signed)
I am ordering a swallow study for the difficulty swallowing and a gastric emptying study for the belching, fullness, and nausea.  For constipation: start taking Linzess 1 capsule 30 minutes before breakfast. It is normal to have some looser stool starting out for the first few days, but this should improve. If it does not, please call us, as we will need to adjust the dosage.   We will see you in 3-4 months! We can arrange a colonoscopy at that time and make sure the prep is better and constipation better managed.  It was a pleasure to see you today. I want to create trusting relationships with patients to provide genuine, compassionate, and quality care. I value your feedback. If you receive a survey regarding your visit,  I greatly appreciate you taking time to fill this out.   Gelene Mink, PhD, ANP-BC [facility]

## 2021-01-25 ENCOUNTER — Ambulatory Visit (HOSPITAL_COMMUNITY): Payer: Commercial Managed Care - PPO

## 2021-01-27 ENCOUNTER — Other Ambulatory Visit: Payer: Self-pay

## 2021-01-27 ENCOUNTER — Ambulatory Visit (HOSPITAL_COMMUNITY)
Admission: RE | Admit: 2021-01-27 | Discharge: 2021-01-27 | Disposition: A | Discharge status: Home / Self Care | Payer: Commercial Managed Care - PPO | Source: Ambulatory Visit | Attending: Gastroenterology | Admitting: Gastroenterology

## 2021-01-27 DIAGNOSIS — R1310 Dysphagia, unspecified: Secondary | ICD-10-CM

## 2021-01-30 ENCOUNTER — Other Ambulatory Visit (HOSPITAL_COMMUNITY): Payer: Commercial Managed Care - PPO

## 2021-02-01 ENCOUNTER — Ambulatory Visit (INDEPENDENT_AMBULATORY_CARE_PROVIDER_SITE_OTHER): Payer: Commercial Managed Care - PPO | Admitting: Urology

## 2021-02-01 ENCOUNTER — Other Ambulatory Visit: Payer: Self-pay

## 2021-02-01 ENCOUNTER — Ambulatory Visit (HOSPITAL_COMMUNITY)
Admission: RE | Admit: 2021-02-01 | Discharge: 2021-02-01 | Disposition: A | Discharge status: Home / Self Care | Payer: Commercial Managed Care - PPO | Source: Ambulatory Visit | Attending: Urology | Admitting: Urology

## 2021-02-01 ENCOUNTER — Encounter: Payer: Self-pay | Admitting: Urology

## 2021-02-01 VITALS — BP 118/74 | HR 101 | Temp 98.6°F | Ht <= 58 in | Wt 224.2 lb

## 2021-02-01 DIAGNOSIS — R1012 Left upper quadrant pain: Secondary | ICD-10-CM

## 2021-02-01 DIAGNOSIS — N200 Calculus of kidney: Secondary | ICD-10-CM | POA: Diagnosis not present

## 2021-02-01 LAB — URINALYSIS, ROUTINE W REFLEX MICROSCOPIC
Bilirubin, UA: NEGATIVE
Glucose, UA: NEGATIVE
Nitrite, UA: NEGATIVE
Protein,UA: NEGATIVE
RBC, UA: NEGATIVE
Specific Gravity, UA: 1.015 (ref 1.005–1.030)
Urobilinogen, Ur: 0.2 mg/dL (ref 0.2–1.0)
pH, UA: 6.5 (ref 5.0–7.5)

## 2021-02-01 LAB — MICROSCOPIC EXAMINATION
Epithelial Cells (non renal): >10 /hpf — AB (ref 0–10)
RBC, Urine: NONE SEEN /hpf (ref 0–2)
Renal Epithel, UA: NONE SEEN /hpf

## 2021-02-01 NOTE — Progress Notes (Signed)
02/01/2021 9:40 AM   [name] Aug 01, 1966 [id number]  Referring provider: Benita Stabile, MD [address] [place],  [place] [address]  Left flank pain  HPI: Ms Free is a 55yo here for followup for left flank pain. She had a renal US which showed a left calcification which is new. She has intermittent left flank pain that is sharp, intermittent moderate and non radiating. felxeril prn partially improved the pain. NO other associated symptoms. No other exacerbating/alleviaiting events.    PMH: Past Medical History:  Diagnosis Date  . Diabetes mellitus    insulin pump 2013  . Hypercholesteremia   . Hypertension   . Kidney stone   . Neuropathy   . Obesity   . Obstructive sleep apnea   . Palpitations   . Shortness of breath     Surgical History: Past Surgical History:  Procedure Laterality Date  . BIOPSY  06/23/2020   Procedure: BIOPSY;  Surgeon: Corbin Ade, MD;  Location: [facility];  Service: Endoscopy;;  . CARDIAC CATHETERIZATION  12/11/2012   normal coronary arteries  . CESAREAN SECTION  1994;2000   [facility]  . CHOLECYSTECTOMY  1992  . COLONOSCOPY WITH PROPOFOL N/A 06/23/2020   large amount of stool in entire colon, prep inadequate.   . CYSTOSCOPY W/ URETERAL STENT PLACEMENT  05/08/2012   Procedure: CYSTOSCOPY WITH RETROGRADE PYELOGRAM/URETERAL STENT PLACEMENT;  Surgeon: Ky Barban, MD;  Location: [facility];  Service: Urology;  Laterality: Right;  . CYSTOSCOPY WITH RETROGRADE PYELOGRAM, URETEROSCOPY AND STENT PLACEMENT Right 05/04/2020   Procedure: CYSTOSCOPY WITH RIGHT RETROGRADE PYELOGRAM, RIGHT URETEROSCOPY AND RIGHT URETERAL STENT EXCHANGE;  Surgeon: Malen Gauze, MD;  Location: [facility];  Service: Urology;  Laterality: Right;  . CYSTOSCOPY WITH RETROGRADE PYELOGRAM, URETEROSCOPY AND STENT PLACEMENT Left 08/08/2020   Procedure: CYSTOSCOPY WITH RETROGRADE PYELOGRAM, URETEROSCOPY WITH LASER  AND STENT PLACEMENT;  Surgeon: Malen Gauze, MD;   Location: [facility];  Service: Urology;  Laterality: Left;  . CYSTOSCOPY WITH RETROGRADE PYELOGRAM, URETEROSCOPY AND STENT PLACEMENT Right 11/08/2020   Procedure: CYSTOSCOPY WITH RIGHT RETROGRADE PYELOGRAM, RIGHT URETEROSCOPY AND STENT PLACEMENT;  Surgeon: Malen Gauze, MD;  Location: [facility];  Service: Urology;  Laterality: Right;  . CYSTOSCOPY WITH RETROGRADE PYELOGRAM, URETEROSCOPY AND STENT PLACEMENT Left 12/27/2020   Procedure: CYSTOSCOPY WITH LEFT RETROGRADE PYELOGRAM, LEFT URETEROSCOPY AND LEFT URETERAL STENT PLACEMENT;  Surgeon: Malen Gauze, MD;  Location: [facility];  Service: Urology;  Laterality: Left;  . CYSTOSCOPY WITH STENT PLACEMENT Right 02/25/2020   Procedure: CYSTOSCOPY WITH RIGHT RETROGRADE RIGHT STENT PLACEMENT;  Surgeon: Jerilee Field, MD;  Location: [facility];  Service: Urology;  Laterality: Right;  . ESOPHAGOGASTRODUODENOSCOPY (EGD) WITH PROPOFOL N/A 06/23/2020   Normal esophagus, multiple localized erosions were found in gastric antrum s/p biopsy, normal duodenum. Empiric dilation.Reactive gastropathy with erosions, negative H.pylori, no dysplasia.    Marland Kitchen EXTRACORPOREAL SHOCK WAVE LITHOTRIPSY Right 10/11/2020   Procedure: EXTRACORPOREAL SHOCK WAVE LITHOTRIPSY (ESWL);  Surgeon: Malen Gauze, MD;  Location: [facility];  Service: Urology;  Laterality: Right;  . FOOT SURGERY Right March, 2013   [facility]-removal of bone spur  . HOLMIUM LASER APPLICATION  05/04/2020   Procedure: HOLMIUM LASER LITHOTRIPSY RIGHT URETERAL CALCULUS;  Surgeon: Malen Gauze, MD;  Location: [facility];  Service: Urology;;  . HYSTEROSCOPY WITH THERMACHOICE  04/25/2012   Procedure: HYSTEROSCOPY WITH THERMACHOICE;  Surgeon: Lazaro Arms, MD;  Location: [facility];  Service: Gynecology;  Laterality: N/A;  total  therapy time= 11 minutes 42 seconds; 34 ml D5w  in and 34 ml D5w out; temp =87 degrees F  . LEFT HEART CATHETERIZATION WITH CORONARY ANGIOGRAM N/A 12/11/2012   Procedure: LEFT HEART  CATHETERIZATION WITH CORONARY ANGIOGRAM;  Surgeon: Runell Gess, MD;  Location: [facility] CATH LAB;  Service: Cardiovascular;  Laterality: N/A;  . [name] DILATION N/A 06/23/2020   Procedure: [name] DILATION;  Surgeon: Corbin Ade, MD;  [facility];  Service: Endoscopy;  Laterality: N/A;  . Sinus sergery  1988   [place]  . STONE EXTRACTION WITH BASKET Right 11/08/2020   Procedure: STONE EXTRACTION WITH BASKET;  Surgeon: Malen Gauze, MD;  Location: [facility];  Service: Urology;  Laterality: Right;  . UMBILICAL HERNIA REPAIR N/A 03/25/2014   Procedure: UMBILICAL HERNIA REPAIR;  Surgeon: Marlane Hatcher, MD;  Location: [facility];  Service: General;  Laterality: N/A;  . uterine ablation      Home Medications:  Allergies as of 02/01/2021      Reactions   Ciprofloxacin Hives, Swelling   Penicillins Anaphylaxis, Rash   Codeine Other (See Comments)   had hallicunations   Morphine Nausea And Vomiting, Other (See Comments)   recieved in ED due to [facility] and had multple doses - gave visual hallucinations and vomitting.   Sulfonamide Derivatives Other (See Comments)   REACTION: Unsure - childhood allergy   Vancomycin Itching      Medication List       Accurate as of February 01, 2021  9:40 AM. If you have any questions, ask your nurse or doctor.        albuterol 108 (90 Base) MCG/ACT inhaler Commonly known as: VENTOLIN HFA Inhale 2 puffs into the lungs every 6 (six) hours as needed for wheezing or shortness of breath.   ALPRAZolam 0.5 MG tablet Commonly known as: XANAX Take 0.5 mg by mouth at bedtime.   ARIPiprazole 2 MG tablet Commonly known as: ABILIFY Take 2 mg by mouth at bedtime.   buPROPion 150 MG 24 hr tablet Commonly known as: WELLBUTRIN XL Take 150 mg by mouth daily.   cyclobenzaprine 5 MG tablet Commonly known as: FLEXERIL Take 1 tablet (5 mg total) by mouth 3 (three) times daily as needed for muscle spasms.   Desvenlafaxine Succinate ER 25 MG  Tb24 Take 1 tablet by mouth daily.   furosemide 20 MG tablet Commonly known as: LASIX Take 40 mg by mouth 2 (two) times daily.   gabapentin 600 MG tablet Commonly known as: NEURONTIN Take 600 mg by mouth 2 (two) times daily.   HumaLOG 100 UNIT/ML injection Generic drug: insulin lispro Inject 3.5 Units into the skin continuous. 3.5 units every 1 hr-insulin pump   ibuprofen 200 MG tablet Commonly known as: ADVIL Take 400 mg by mouth every 6 (six) hours as needed for headache or moderate pain.   omeprazole 40 MG capsule Commonly known as: PRILOSEC Take 40 mg by mouth 2 (two) times daily.   ondansetron 4 MG tablet Commonly known as: ZOFRAN Take 1 tablet (4 mg total) by mouth every 8 (eight) hours as needed.   oxybutynin 5 MG tablet Commonly known as: DITROPAN Take 5 mg by mouth at bedtime.   oxyCODONE-acetaminophen 5-325 MG tablet Commonly known as: Percocet Take 1 tablet by mouth every 4 (four) hours as needed for severe pain.   Potassium 99 MG Tabs Take 99 mg by mouth daily.   simvastatin 40 MG tablet Commonly known as: ZOCOR Take 40 mg  by mouth daily.   tamsulosin 0.4 MG Caps capsule Commonly known as: FLOMAX TAKE 1 CAPSULE(0.4 MG) BY MOUTH DAILY AFTER AND SUPPER What changed:   how much to take  how to take this  when to take this       Allergies:  Allergies  Allergen Reactions  . Ciprofloxacin Hives and Swelling  . Penicillins Anaphylaxis and Rash  . Codeine Other (See Comments)    had hallicunations  . Morphine Nausea And Vomiting and Other (See Comments)    recieved in ED due to [facility] and had multple doses - gave visual hallucinations and vomitting.  . Sulfonamide Derivatives Other (See Comments)    REACTION: Unsure - childhood allergy  . Vancomycin Itching    Family History: Family History  Problem Relation Age of Onset  . Diabetes Mother   . Depression Paternal Grandmother   . Depression Paternal Grandfather   . Heart disease  Other   . Cancer Other   . Diabetes Other   . Achalasia Other   . Anesthesia problems Neg Hx   . Hypotension Neg Hx   . Malignant hyperthermia Neg Hx   . Pseudochol deficiency Neg Hx     Social History:  reports that she has never smoked. She has never used smokeless tobacco. She reports that she does not drink alcohol and does not use drugs.  ROS: All other review of systems were reviewed and are negative except what is noted above in HPI  Physical Exam: BP 118/74   Pulse (!) 101   Temp 98.6 F (37 C)   Ht  (1.422 m)   Wt 224 lb 3.2 oz (101.7 kg)   LMP 07/16/2012   BMI 50.26 kg/m   Constitutional:  Alert and oriented, No acute distress. HEENT: [place] AT, moist mucus membranes.  Trachea midline, no masses. Cardiovascular: No clubbing, cyanosis, or edema. Respiratory: Normal respiratory effort, no increased work of breathing. GI: Abdomen is soft, nontender, nondistended, no abdominal masses GU: No CVA tenderness.  Lymph: No cervical or inguinal lymphadenopathy. Skin: No rashes, bruises or suspicious lesions. Neurologic: Grossly intact, no focal deficits, moving all 4 extremities. Psychiatric: Normal mood and affect.  Laboratory Data: Lab Results  Component Value Date   WBC 11.4 (H) 05/02/2020   HGB 12.6 05/04/2020   HCT 37.0 05/04/2020   MCV 86.4 05/02/2020   PLT 384 05/02/2020    Lab Results  Component Value Date   CREATININE 0.60 12/26/2020    No results found for: PSA  No results found for: TESTOSTERONE  Lab Results  Component Value Date   HGBA1C 7.6 (H) 11/07/2020    Urinalysis    Component Value Date/Time   COLORURINE AMBER (A) 04/04/2020 0309   APPEARANCEUR Clear 01/13/2021 1247   LABSPEC 1.020 04/04/2020 0309   PHURINE 6.0 04/04/2020 0309   GLUCOSEU Negative 01/13/2021 1247   HGBUR LARGE (A) 04/04/2020 0309   HGBUR moderate 08/31/2008 0908   BILIRUBINUR Negative 01/13/2021 1247   KETONESUR NEGATIVE 04/04/2020 0309   PROTEINUR Negative  01/13/2021 1247   PROTEINUR 100 (A) 04/04/2020 0309   UROBILINOGEN 0.2 12/14/2020 0835   UROBILINOGEN 0.2 09/21/2014 1055   NITRITE Negative 01/13/2021 1247   NITRITE NEGATIVE 04/04/2020 0309   LEUKOCYTESUR Negative 01/13/2021 1247   LEUKOCYTESUR SMALL (A) 04/04/2020 0309    Lab Results  Component Value Date   LABMICR Comment 01/13/2021   WBCUA 11-30 (A) 10/31/2020   LABEPIT 0-10 10/31/2020   BACTERIA Many (A) 10/31/2020  Pertinent Imaging: Renal US 01/13/2021: Images reviewed and discussed with the patient Results for orders placed during the hospital encounter of 10/31/20  DG Abd 1 View  Narrative CLINICAL DATA:  Status post lithotripsy  EXAM: ABDOMEN - 1 VIEW  COMPARISON:  10/10/2020  FINDINGS: Scattered large and small bowel gas is noted. Scattered calculi are noted over the lower pole of the right kidney. The largest of these measures approximately 6 mm. No definitive ureteral stones are noted. Degenerative changes of lumbar spine are seen.  IMPRESSION: Persistent right lower pole renal stones. The largest of these measures approximately 6 mm.   Electronically Signed By: Alcide Clever M.D. On: 10/31/2020 11:38  No results found for this or any previous visit.  No results found for this or any previous visit.  No results found for this or any previous visit.  Results for orders placed in visit on 01/13/21  Ultrasound renal complete  Narrative CLINICAL DATA:  Left flank pain  EXAM: RENAL / URINARY TRACT ULTRASOUND COMPLETE  COMPARISON:  CT 10/31/2020  FINDINGS: Right Kidney:  Renal measurements: 13.3 x 4.7 x 6.5 cm = volume: 210 mL. Renal cortical echogenicity is normal and cortical thickness has been preserved. An 8 mm nonobstructing calculus is seen within the lower pole the right kidney. Possible 6 mm nonobstructing calculus within the upper pole of the right kidney. No hydronephrosis. No intrarenal masses.  Left Kidney:  Renal  measurements: 12.4 x 5.4 x 5.2 cm = volume: 185 mL. Renal cortical echogenicity is normal and cortical thickness has been preserved. A 5 mm nonobstructing calculus is noted within the lower pole of the left kidney. No hydronephrosis. No intrarenal masses are seen.  Bladder:  Appears normal for degree of bladder distention.  Other:  None.  IMPRESSION: Bilateral nonobstructing nephrolithiasis, similar to that noted on prior CT examination.   Electronically Signed By: Helyn Numbers MD On: 01/13/2021 13:47  No results found for this or any previous visit.  No results found for this or any previous visit.  Results for orders placed in visit on 10/31/20  CT RENAL STONE STUDY  Narrative CLINICAL DATA:  Left flank pain with nausea and vomiting  EXAM: CT ABDOMEN AND PELVIS WITHOUT CONTRAST  TECHNIQUE: Multidetector CT imaging of the abdomen and pelvis was performed following the standard protocol without IV contrast.  COMPARISON:  April 04, 2020 CT abdomen and pelvis; October 31, 2020 abdominal radiograph  FINDINGS: Lower chest: Lung bases are clear.  Hepatobiliary: No focal liver lesions are appreciable on this noncontrast enhanced study. Gallbladder is absent. There is no appreciable biliary duct dilatation.  Pancreas: There is no pancreatic mass or inflammatory focus.  Spleen: No splenic lesions are evident.  Adrenals/Urinary Tract: Adrenals bilaterally appear normal. There is no evident renal mass on either side. There is slight hydronephrosis on the right. On the right, there is scarring in the upper pole kidney region. There is a 1 mm calculus in the upper pole the right kidney. There is a calculus in the lower pole of the right kidney measuring 8 x 6 mm. There is a 4 x 3 mm calculus with an adjacent 1 mm calculus in the lower pole of the right kidney as well. There is a 3 x 3 mm calculus in the lower pole left kidney. There is a 2 mm calculus as well as  a 1 mm calculus in the upper to mid left kidney. Tiny calculi elsewhere in the mid kidney noted on the left.  No ureteral calculus is appreciable on either side. The urinary bladder is midline with wall thickness within normal limits.  Stomach/Bowel: There is no appreciable bowel wall or mesenteric thickening. There are sigmoid diverticula without diverticulitis. The terminal ileum appears normal. There is no evident bowel obstruction. There is no free air or portal venous air.  Vascular/Lymphatic: There is no abdominal aortic aneurysm. There are occasional foci of aortic atherosclerosis. No adenopathy is appreciable in the abdomen or pelvis.  Reproductive: The uterus is anteverted.  No evident pelvic mass.  Other: Appendix appears normal. No evident abscess or ascites in the abdomen or pelvis. There is mild fat in the umbilicus with a surgical clip in this area.  Musculoskeletal: There are foci of degenerative change in the lumbar spine. There are no blastic or lytic bone lesions. No intramuscular lesions are evident.  IMPRESSION: 1. Slight hydronephrosis on the right without obstructing lesion appreciable on the right. Particular, no right ureteral calculus. Question recent calculus passage on the right. A degree of pyelonephritis potentially could present in this manner.  2. Nonobstructing calculi on each side, larger and more notable on the right than on the left.  3.  Scarring upper pole right kidney.  4. No evident bowel wall thickening or bowel obstruction. No abscess in the abdomen pelvis. Appendix appears normal. Occasional sigmoid diverticula without diverticulitis.  5.  Aortic Atherosclerosis (ICD10-I70.0).  6.  Gallbladder absent.   Electronically Signed By: Bretta Bang III M.D. On: 10/31/2020 11:38   Assessment & Plan:    1. Kidney stones -STAT CT stone study - Urinalysis, Routine w reflex microscopic   No follow-ups on file.  Wilkie Aye, MD  [facility] [place]

## 2021-02-01 NOTE — Patient Instructions (Signed)

## 2021-02-01 NOTE — Progress Notes (Signed)
Urological Symptom Review  Patient is experiencing the following symptoms: Frequent urination Hard to postpone urination Get up at night to urinate Leakage of urine Stream starts and stops Trouble starting stream Have to strain to urinate Weak stream   Review of Systems  Gastrointestinal (upper)  : Negative for upper GI symptoms  Gastrointestinal (lower) : Negative for lower GI symptoms  Constitutional : Fatigue  Skin: Negative for skin symptoms  Eyes: Negative for eye symptoms  Ear/Nose/Throat : Negative for Ear/Nose/Throat symptoms  Hematologic/Lymphatic: Negative for Hematologic/Lymphatic symptoms  Cardiovascular : Negative for cardiovascular symptoms  Respiratory : Negative for respiratory symptoms  Endocrine: Negative for endocrine symptoms  Musculoskeletal: Negative for musculoskeletal symptoms  Neurological: Headaches Dizziness  Psychologic: Depression Anxiety

## 2021-02-02 ENCOUNTER — Ambulatory Visit (HOSPITAL_COMMUNITY): Payer: Commercial Managed Care - PPO

## 2021-02-02 NOTE — Progress Notes (Signed)
CT shows multiple new bilateral renal calculi. She needs to have a 24 hour urine

## 2021-02-03 ENCOUNTER — Telehealth: Payer: Self-pay

## 2021-02-03 NOTE — Telephone Encounter (Signed)
Pt called back wanting to know if any stones showed on CT. Spoke with Dr. Ronne Binning and he said yes bilateral stones. Pt notified.

## 2021-02-03 NOTE — Telephone Encounter (Signed)
Pt called wanting to know what to do next after CT. Spoke with Dr. Ronne Binning and he ordered a 24 hr urine collection. Told pt paperwork would be up front with number to call.

## 2021-02-07 ENCOUNTER — Ambulatory Visit (HOSPITAL_COMMUNITY): Admission: RE | Admit: 2021-02-07 | Payer: Commercial Managed Care - PPO | Source: Ambulatory Visit

## 2021-02-15 ENCOUNTER — Ambulatory Visit (INDEPENDENT_AMBULATORY_CARE_PROVIDER_SITE_OTHER): Payer: Commercial Managed Care - PPO | Admitting: Urology

## 2021-02-15 ENCOUNTER — Other Ambulatory Visit: Payer: Self-pay

## 2021-02-15 VITALS — BP 152/77 | HR 78 | Temp 97.9°F | Ht <= 58 in | Wt 215.0 lb

## 2021-02-15 DIAGNOSIS — N200 Calculus of kidney: Secondary | ICD-10-CM

## 2021-02-15 LAB — URINALYSIS, ROUTINE W REFLEX MICROSCOPIC
Bilirubin, UA: NEGATIVE
Glucose, UA: NEGATIVE
Ketones, UA: NEGATIVE
Leukocytes,UA: NEGATIVE
Nitrite, UA: NEGATIVE
Protein,UA: NEGATIVE
RBC, UA: NEGATIVE
Specific Gravity, UA: 1.025 (ref 1.005–1.030)
Urobilinogen, Ur: 0.2 mg/dL (ref 0.2–1.0)
pH, UA: 6 (ref 5.0–7.5)

## 2021-02-15 MED ORDER — POTASSIUM CITRATE ER 15 MEQ (1620 MG) PO TBCR: 1.0000 | EXTENDED_RELEASE_TABLET | Freq: Two times a day (BID) | ORAL | 11 refills | Status: DC

## 2021-02-15 NOTE — Progress Notes (Signed)
02/15/2021 9:10 AM   [name] [birth year] [id number]  Referring provider: Benita Stabile, MD [address] [place],  [place] [address]  nephrolithiasis  HPI: [name] is a 54yo here for followup for nephrolithiasis. CT from 02/01/2021 shows bilateral 2-24mm renal calculi. She turned in her 24 hour urine last week. UA shows pH of 6.0. She has intermittent left flank pain. No other associated symptoms. No worsening LUTS. No hematuria   PMH: Past Medical History:  Diagnosis Date  . Diabetes mellitus    insulin pump 2013  . Hypercholesteremia   . Hypertension   . Kidney stone   . Neuropathy   . Obesity   . Obstructive sleep apnea   . Palpitations   . Shortness of breath     Surgical History: Past Surgical History:  Procedure Laterality Date  . BIOPSY  06/23/2020   Procedure: BIOPSY;  Surgeon: Corbin Ade, MD;  Location: [facility];  Service: Endoscopy;;  . CARDIAC CATHETERIZATION  12/11/2012   normal coronary arteries  . CESAREAN SECTION  1994;2000   [facility]  . CHOLECYSTECTOMY  1992  . COLONOSCOPY WITH PROPOFOL N/A 06/23/2020   large amount of stool in entire colon, prep inadequate.   . CYSTOSCOPY W/ URETERAL STENT PLACEMENT  05/08/2012   Procedure: CYSTOSCOPY WITH RETROGRADE PYELOGRAM/URETERAL STENT PLACEMENT;  Surgeon: Ky Barban, MD;  Location: [facility];  Service: Urology;  Laterality: Right;  . CYSTOSCOPY WITH RETROGRADE PYELOGRAM, URETEROSCOPY AND STENT PLACEMENT Right 05/04/2020   Procedure: CYSTOSCOPY WITH RIGHT RETROGRADE PYELOGRAM, RIGHT URETEROSCOPY AND RIGHT URETERAL STENT EXCHANGE;  Surgeon: Malen Gauze, MD;  Location: [facility];  Service: Urology;  Laterality: Right;  . CYSTOSCOPY WITH RETROGRADE PYELOGRAM, URETEROSCOPY AND STENT PLACEMENT Left 08/08/2020   Procedure: CYSTOSCOPY WITH RETROGRADE PYELOGRAM, URETEROSCOPY WITH LASER  AND STENT PLACEMENT;  Surgeon: Malen Gauze, MD;  Location: [facility];  Service: Urology;  Laterality: Left;  .  CYSTOSCOPY WITH RETROGRADE PYELOGRAM, URETEROSCOPY AND STENT PLACEMENT Right 11/08/2020   Procedure: CYSTOSCOPY WITH RIGHT RETROGRADE PYELOGRAM, RIGHT URETEROSCOPY AND STENT PLACEMENT;  Surgeon: Malen Gauze, MD;  Location: [facility];  Service: Urology;  Laterality: Right;  . CYSTOSCOPY WITH RETROGRADE PYELOGRAM, URETEROSCOPY AND STENT PLACEMENT Left 12/27/2020   Procedure: CYSTOSCOPY WITH LEFT RETROGRADE PYELOGRAM, LEFT URETEROSCOPY AND LEFT URETERAL STENT PLACEMENT;  Surgeon: Malen Gauze, MD;  Location: [facility];  Service: Urology;  Laterality: Left;  . CYSTOSCOPY WITH STENT PLACEMENT Right 02/25/2020   Procedure: CYSTOSCOPY WITH RIGHT RETROGRADE RIGHT STENT PLACEMENT;  Surgeon: Jerilee Field, MD;  Location: [facility];  Service: Urology;  Laterality: Right;  . ESOPHAGOGASTRODUODENOSCOPY (EGD) WITH PROPOFOL N/A 06/23/2020   Normal esophagus, multiple localized erosions were found in gastric antrum s/p biopsy, normal duodenum. Empiric dilation.Reactive gastropathy with erosions, negative H.pylori, no dysplasia.    Marland Kitchen EXTRACORPOREAL SHOCK WAVE LITHOTRIPSY Right 10/11/2020   Procedure: EXTRACORPOREAL SHOCK WAVE LITHOTRIPSY (ESWL);  Surgeon: Malen Gauze, MD;  Location: [facility];  Service: Urology;  Laterality: Right;  . FOOT SURGERY Right March, 2013   [facility]-removal of bone spur  . HOLMIUM LASER APPLICATION  05/04/2020   Procedure: HOLMIUM LASER LITHOTRIPSY RIGHT URETERAL CALCULUS;  Surgeon: Malen Gauze, MD;  Location: [facility];  Service: Urology;;  . HYSTEROSCOPY WITH THERMACHOICE  04/25/2012   Procedure: HYSTEROSCOPY WITH THERMACHOICE;  Surgeon: Lazaro Arms, MD;  Location: [facility];  Service: Gynecology;  Laterality: N/A;  total therapy time= 11 minutes 42 seconds; 34 ml D5w  in and 34 ml D5w out; temp =87 degrees F  . LEFT HEART CATHETERIZATION WITH CORONARY ANGIOGRAM N/A 12/11/2012   Procedure: LEFT HEART CATHETERIZATION WITH CORONARY ANGIOGRAM;  Surgeon: Runell Gess,  MD;  Location: [facility] CATH LAB;  Service: Cardiovascular;  Laterality: N/A;  . [name] DILATION N/A 06/23/2020   Procedure: [name] DILATION;  Surgeon: Corbin Ade, MD;  [facility];  Service: Endoscopy;  Laterality: N/A;  . Sinus sergery  1988   [place]  . STONE EXTRACTION WITH BASKET Right 11/08/2020   Procedure: STONE EXTRACTION WITH BASKET;  Surgeon: Malen Gauze, MD;  Location: [facility];  Service: Urology;  Laterality: Right;  . UMBILICAL HERNIA REPAIR N/A 03/25/2014   Procedure: UMBILICAL HERNIA REPAIR;  Surgeon: Marlane Hatcher, MD;  Location: [facility];  Service: General;  Laterality: N/A;  . uterine ablation      Home Medications:  Allergies as of 02/15/2021      Reactions   Ciprofloxacin Hives, Swelling   Penicillins Anaphylaxis, Rash   Codeine Other (See Comments)   had hallicunations   Morphine Nausea And Vomiting, Other (See Comments)   recieved in ED due to [facility] and had multple doses - gave visual hallucinations and vomitting.   Sulfonamide Derivatives Other (See Comments)   REACTION: Unsure - childhood allergy   Vancomycin Itching      Medication List       Accurate as of February 15, 2021  9:10 AM. If you have any questions, ask your nurse or doctor.        albuterol 108 (90 Base) MCG/ACT inhaler Commonly known as: VENTOLIN HFA Inhale 2 puffs into the lungs every 6 (six) hours as needed for wheezing or shortness of breath.   ALPRAZolam 0.5 MG tablet Commonly known as: XANAX Take 0.5 mg by mouth at bedtime.   ARIPiprazole 2 MG tablet Commonly known as: ABILIFY Take 2 mg by mouth at bedtime.   buPROPion 150 MG 24 hr tablet Commonly known as: WELLBUTRIN XL Take 150 mg by mouth daily.   cyclobenzaprine 5 MG tablet Commonly known as: FLEXERIL Take 1 tablet (5 mg total) by mouth 3 (three) times daily as needed for muscle spasms.   desvenlafaxine 50 MG 24 hr tablet Commonly known as: PRISTIQ Take 50 mg by mouth daily. What changed:  Another medication with the same name was removed. Continue taking this medication, and follow the directions you see here. Changed by: Wilkie Aye, MD   furosemide 20 MG tablet Commonly known as: LASIX Take 40 mg by mouth 2 (two) times daily.   gabapentin 600 MG tablet Commonly known as: NEURONTIN Take 600 mg by mouth 2 (two) times daily.   HumaLOG 100 UNIT/ML injection Generic drug: insulin lispro Inject 3.5 Units into the skin continuous. 3.5 units every 1 hr-insulin pump   ibuprofen 200 MG tablet Commonly known as: ADVIL Take 400 mg by mouth every 6 (six) hours as needed for headache or moderate pain.   omeprazole 40 MG capsule Commonly known as: PRILOSEC Take 40 mg by mouth 2 (two) times daily.   ondansetron 4 MG tablet Commonly known as: ZOFRAN Take 1 tablet (4 mg total) by mouth every 8 (eight) hours as needed.   oxybutynin 5 MG tablet Commonly known as: DITROPAN Take 5 mg by mouth at bedtime.   oxyCODONE-acetaminophen 5-325 MG tablet Commonly known as: Percocet Take 1 tablet by mouth every 4 (four) hours as needed for severe pain.   pantoprazole 40 MG  tablet Commonly known as: PROTONIX Take 40 mg by mouth 2 (two) times daily.   Potassium 99 MG Tabs Take 99 mg by mouth daily.   simvastatin 40 MG tablet Commonly known as: ZOCOR Take 40 mg by mouth daily.   tamsulosin 0.4 MG Caps capsule Commonly known as: FLOMAX TAKE 1 CAPSULE(0.4 MG) BY MOUTH DAILY AFTER AND SUPPER What changed:   how much to take  how to take this  when to take this       Allergies:  Allergies  Allergen Reactions  . Ciprofloxacin Hives and Swelling  . Penicillins Anaphylaxis and Rash  . Codeine Other (See Comments)    had hallicunations  . Morphine Nausea And Vomiting and Other (See Comments)    recieved in ED due to [facility] and had multple doses - gave visual hallucinations and vomitting.  . Sulfonamide Derivatives Other (See Comments)    REACTION: Unsure -  childhood allergy  . Vancomycin Itching    Family History: Family History  Problem Relation Age of Onset  . Diabetes Mother   . Depression Paternal Grandmother   . Depression Paternal Grandfather   . Heart disease Other   . Cancer Other   . Diabetes Other   . Achalasia Other   . Anesthesia problems Neg Hx   . Hypotension Neg Hx   . Malignant hyperthermia Neg Hx   . Pseudochol deficiency Neg Hx     Social History:  reports that she has never smoked. She has never used smokeless tobacco. She reports that she does not drink alcohol and does not use drugs.  ROS: All other review of systems were reviewed and are negative except what is noted above in HPI  Physical Exam: BP (!) 152/77   Pulse 78   Temp 97.9 F (36.6 C)   Ht 4\' 8"  (1.422 m)   Wt 215 lb (97.5 kg)   LMP 07/16/2012   BMI 48.20 kg/m   Constitutional:  Alert and oriented, No acute distress. HEENT: [place] AT, moist mucus membranes.  Trachea midline, no masses. Cardiovascular: No clubbing, cyanosis, or edema. Respiratory: Normal respiratory effort, no increased work of breathing. GI: Abdomen is soft, nontender, nondistended, no abdominal masses GU: No CVA tenderness.  Lymph: No cervical or inguinal lymphadenopathy. Skin: No rashes, bruises or suspicious lesions. Neurologic: Grossly intact, no focal deficits, moving all 4 extremities. Psychiatric: Normal mood and affect.  Laboratory Data: Lab Results  Component Value Date   WBC 11.4 (H) 05/02/2020   HGB 12.6 05/04/2020   HCT 37.0 05/04/2020   MCV 86.4 05/02/2020   PLT 384 05/02/2020    Lab Results  Component Value Date   CREATININE 0.60 12/26/2020    No results found for: PSA  No results found for: TESTOSTERONE  Lab Results  Component Value Date   HGBA1C 7.6 (H) 11/07/2020    Urinalysis    Component Value Date/Time   COLORURINE AMBER (A) 04/04/2020 0309   APPEARANCEUR Clear 02/01/2021 0924   LABSPEC 1.020 04/04/2020 0309   PHURINE 6.0  04/04/2020 0309   GLUCOSEU Negative 02/01/2021 0924   HGBUR LARGE (A) 04/04/2020 0309   HGBUR moderate 08/31/2008 0908   BILIRUBINUR Negative 02/01/2021 0924   KETONESUR NEGATIVE 04/04/2020 0309   PROTEINUR Negative 02/01/2021 0924   PROTEINUR 100 (A) 04/04/2020 0309   UROBILINOGEN 0.2 12/14/2020 0835   UROBILINOGEN 0.2 09/21/2014 1055   NITRITE Negative 02/01/2021 0924   NITRITE NEGATIVE 04/04/2020 0309   LEUKOCYTESUR Trace (A) 02/01/2021 02/03/2021  LEUKOCYTESUR SMALL (A) 04/04/2020 0309    Lab Results  Component Value Date   LABMICR See below: 02/01/2021   WBCUA 6-10 (A) 02/01/2021   LABEPIT >10 (A) 02/01/2021   BACTERIA Few 02/01/2021    Pertinent Imaging: CT 02/01/2021: Images reviewed and discussed with the patient Results for orders placed during the hospital encounter of 10/31/20  DG Abd 1 View  Narrative CLINICAL DATA:  Status post lithotripsy  EXAM: ABDOMEN - 1 VIEW  COMPARISON:  10/10/2020  FINDINGS: Scattered large and small bowel gas is noted. Scattered calculi are noted over the lower pole of the right kidney. The largest of these measures approximately 6 mm. No definitive ureteral stones are noted. Degenerative changes of lumbar spine are seen.  IMPRESSION: Persistent right lower pole renal stones. The largest of these measures approximately 6 mm.   Electronically Signed By: Alcide Clever M.D. On: 10/31/2020 11:38  No results found for this or any previous visit.  No results found for this or any previous visit.  No results found for this or any previous visit.  Results for orders placed in visit on 01/13/21  Ultrasound renal complete  Narrative CLINICAL DATA:  Left flank pain  EXAM: RENAL / URINARY TRACT ULTRASOUND COMPLETE  COMPARISON:  CT 10/31/2020  FINDINGS: Right Kidney:  Renal measurements: 13.3 x 4.7 x 6.5 cm = volume: 210 mL. Renal cortical echogenicity is normal and cortical thickness has been preserved. An 8 mm  nonobstructing calculus is seen within the lower pole the right kidney. Possible 6 mm nonobstructing calculus within the upper pole of the right kidney. No hydronephrosis. No intrarenal masses.  Left Kidney:  Renal measurements: 12.4 x 5.4 x 5.2 cm = volume: 185 mL. Renal cortical echogenicity is normal and cortical thickness has been preserved. A 5 mm nonobstructing calculus is noted within the lower pole of the left kidney. No hydronephrosis. No intrarenal masses are seen.  Bladder:  Appears normal for degree of bladder distention.  Other:  None.  IMPRESSION: Bilateral nonobstructing nephrolithiasis, similar to that noted on prior CT examination.   Electronically Signed By: Helyn Numbers MD On: 01/13/2021 13:47  No results found for this or any previous visit.  No results found for this or any previous visit.  Results for orders placed during the hospital encounter of 02/01/21  CT RENAL STONE STUDY  Narrative CLINICAL DATA:  Left upper quadrant and left flank pain with hematuria for 3 weeks.  EXAM: CT ABDOMEN AND PELVIS WITHOUT CONTRAST  TECHNIQUE: Multidetector CT imaging of the abdomen and pelvis was performed following the standard protocol without IV contrast.  COMPARISON:  10/31/2020 CT abdomen/pelvis.  FINDINGS: Lower chest: No significant pulmonary nodules or acute consolidative airspace disease.  Hepatobiliary: Normal liver size. No liver mass. Cholecystectomy. No biliary ductal dilatation.  Pancreas: Normal, with no mass or duct dilation.  Spleen: Normal size. No mass.  Adrenals/Urinary Tract: Left adrenal 1.1 cm nodule with density 17 HU, stable since 04/04/2020 CT, compatible with an adenoma. Normal right adrenal. Nonobstructing 6 mm and 1 mm lower right renal stones. Multiple (at least 6) nonobstructing left renal stones, largest 4 mm in the interpolar left kidney. Mild fullness of the right renal collecting system without overt  hydronephrosis, unchanged from prior CT. No left hydronephrosis. No contour deforming renal masses. Normal caliber ureters. No ureteral stones. Normal bladder.  Stomach/Bowel: Normal non-distended stomach. Normal caliber small bowel with no small bowel wall thickening. Normal appendix. Normal large bowel with no diverticulosis, large  bowel wall thickening or pericolonic fat stranding.  Vascular/Lymphatic: Mildly atherosclerotic nonaneurysmal abdominal aorta. No pathologically enlarged lymph nodes in the abdomen or pelvis.  Reproductive: Grossly normal uterus.  No adnexal mass.  Other: No pneumoperitoneum, ascites or focal fluid collection.  Musculoskeletal: No aggressive appearing focal osseous lesions. Chronic bilateral L5 pars defects. Moderate thoracolumbar spondylosis.  IMPRESSION: 1. Nonobstructing bilateral nephrolithiasis. No overt hydronephrosis. No ureteral or bladder stones. 2. Stable left adrenal adenoma. 3. Chronic bilateral L5 pars defects. 4. Aortic Atherosclerosis (ICD10-I70.0).   Electronically Signed By: Delbert Phenix M.D. On: 02/01/2021 13:06   Assessment & Plan:    1. Kidney stones -We discussed the management of kidney stones. These options include observation, ureteroscopy, shockwave lithotripsy (ESWL) and percutaneous nephrolithotomy (PCNL). We discussed which options are relevant to the patient's stone(s). We discussed the natural history of kidney stones as well as the complications of untreated stones and the impact on quality of life without treatment as well as with each of the above listed treatments. We also discussed the efficacy of each treatment in its ability to clear the stone burden. With any of these management options I discussed the signs and symptoms of infection and the need for emergent treatment should these be experienced. For each option we discussed the ability of each procedure to clear the patient of their stone burden.   For  observation I described the risks which include but are not limited to silent renal damage, life-threatening infection, need for emergent surgery, failure to pass stone and pain.   For ureteroscopy I described the risks which include bleeding, infection, damage to contiguous structures, positioning injury, ureteral stricture, ureteral avulsion, ureteral injury, need for prolonged ureteral stent, inability to perform ureteroscopy, need for an interval procedure, inability to clear stone burden, stent discomfort/pain, heart attack, stroke, pulmonary embolus and the inherent risks with general anesthesia.   For shockwave lithotripsy I described the risks which include arrhythmia, kidney contusion, kidney hemorrhage, need for transfusion, pain, inability to adequately break up stone, inability to pass stone fragments, Steinstrasse, infection associated with obstructing stones, need for alternate surgical procedure, need for repeat shockwave lithotripsy, MI, CVA, PE and the inherent risks with anesthesia/conscious sedation.   For PCNL I described the risks including positioning injury, pneumothorax, hydrothorax, need for chest tube, inability to clear stone burden, renal laceration, arterial venous fistula or malformation, need for embolization of kidney, loss of kidney or renal function, need for repeat procedure, need for prolonged nephrostomy tube, ureteral avulsion, MI, CVA, PE and the inherent risks of general anesthesia.   - The patient would like to proceed with bilateral ureteroscopic stone extraction - Urinalysis, Routine w reflex microscopic   No follow-ups on file.  Wilkie Aye, MD  [facility] [place]

## 2021-02-15 NOTE — H&P (View-Only) (Signed)
 02/15/2021 9:10 AM   [name] 02/01/1966 [id number]  Referring provider: Hall, John Z, MD [address] [place],  [place] [address]  nephrolithiasis  HPI: Ms Hemmer is a 54yo here for followup for nephrolithiasis. CT from 02/01/2021 shows bilateral 2-4mm renal calculi. She turned in her 24 hour urine last week. UA shows pH of 6.0. She has intermittent left flank pain. No other associated symptoms. No worsening LUTS. No hematuria   PMH: Past Medical History:  Diagnosis Date  . Diabetes mellitus    insulin pump 2013  . Hypercholesteremia   . Hypertension   . Kidney stone   . Neuropathy   . Obesity   . Obstructive sleep apnea   . Palpitations   . Shortness of breath     Surgical History: Past Surgical History:  Procedure Laterality Date  . BIOPSY  06/23/2020   Procedure: BIOPSY;  Surgeon: Rourk, Robert M, MD;  [facility]: [facility];  Service: Endoscopy;;  . CARDIAC CATHETERIZATION  12/11/2012   normal coronary arteries  . CESAREAN SECTION  1994;2000   [facility]  . CHOLECYSTECTOMY  1992  . COLONOSCOPY WITH PROPOFOL N/A 06/23/2020   large amount of stool in entire colon, prep inadequate.   . CYSTOSCOPY W/ URETERAL STENT PLACEMENT  05/08/2012   Procedure: CYSTOSCOPY WITH RETROGRADE PYELOGRAM/URETERAL STENT PLACEMENT;  Surgeon: Mohammad I Javaid, MD;  Location: [facility];  Service: Urology;  Laterality: Right;  . CYSTOSCOPY WITH RETROGRADE PYELOGRAM, URETEROSCOPY AND STENT PLACEMENT Right 05/04/2020   Procedure: CYSTOSCOPY WITH RIGHT RETROGRADE PYELOGRAM, RIGHT URETEROSCOPY AND RIGHT URETERAL STENT EXCHANGE;  Surgeon: Yamil Oelke L, MD;  Location: [facility];  Service: Urology;  Laterality: Right;  . CYSTOSCOPY WITH RETROGRADE PYELOGRAM, URETEROSCOPY AND STENT PLACEMENT Left 08/08/2020   Procedure: CYSTOSCOPY WITH RETROGRADE PYELOGRAM, URETEROSCOPY WITH LASER  AND STENT PLACEMENT;  Surgeon: Kable Haywood L, MD;  Location: [facility];  Service: Urology;  Laterality: Left;  .  CYSTOSCOPY WITH RETROGRADE PYELOGRAM, URETEROSCOPY AND STENT PLACEMENT Right 11/08/2020   Procedure: CYSTOSCOPY WITH RIGHT RETROGRADE PYELOGRAM, RIGHT URETEROSCOPY AND STENT PLACEMENT;  Surgeon: Jona Erkkila L, MD;  Location: [facility];  Service: Urology;  Laterality: Right;  . CYSTOSCOPY WITH RETROGRADE PYELOGRAM, URETEROSCOPY AND STENT PLACEMENT Left 12/27/2020   Procedure: CYSTOSCOPY WITH LEFT RETROGRADE PYELOGRAM, LEFT URETEROSCOPY AND LEFT URETERAL STENT PLACEMENT;  Surgeon: Moataz Tavis L, MD;  Location: [facility];  Service: Urology;  Laterality: Left;  . CYSTOSCOPY WITH STENT PLACEMENT Right 02/25/2020   Procedure: CYSTOSCOPY WITH RIGHT RETROGRADE RIGHT STENT PLACEMENT;  Surgeon: Eskridge, Matthew, MD;  Location: [facility];  Service: Urology;  Laterality: Right;  . ESOPHAGOGASTRODUODENOSCOPY (EGD) WITH PROPOFOL N/A 06/23/2020   Normal esophagus, multiple localized erosions were found in gastric antrum s/p biopsy, normal duodenum. Empiric dilation.Reactive gastropathy with erosions, negative H.pylori, no dysplasia.    . EXTRACORPOREAL SHOCK WAVE LITHOTRIPSY Right 10/11/2020   Procedure: EXTRACORPOREAL SHOCK WAVE LITHOTRIPSY (ESWL);  Surgeon: Saharra Santo L, MD;  Location: [facility];  Service: Urology;  Laterality: Right;  . FOOT SURGERY Right March, 2013   [facility]-removal of bone spur  . HOLMIUM LASER APPLICATION  05/04/2020   Procedure: HOLMIUM LASER LITHOTRIPSY RIGHT URETERAL CALCULUS;  Surgeon: Shanique Aslinger L, MD;  Location: [facility];  Service: Urology;;  . HYSTEROSCOPY WITH THERMACHOICE  04/25/2012   Procedure: HYSTEROSCOPY WITH THERMACHOICE;  Surgeon: Luther H Eure, MD;  Location: [facility];  Service: Gynecology;  Laterality: N/A;  total therapy time= 11 minutes 42 seconds; 34 ml D5w    in and 34 ml D5w out; temp =87 degrees F  . LEFT HEART CATHETERIZATION WITH CORONARY ANGIOGRAM N/A 12/11/2012   Procedure: LEFT HEART CATHETERIZATION WITH CORONARY ANGIOGRAM;  Surgeon: Jonathan J Berry,  MD;  Location: [facility];  Service: Cardiovascular;  Laterality: N/A;  . MALONEY DILATION N/A 06/23/2020   Procedure: MALONEY DILATION;  Surgeon: Rourk, Robert M, MD;  [facility];  Service: Endoscopy;  Laterality: N/A;  . Sinus sergery  1988   [place]  . STONE EXTRACTION WITH BASKET Right 11/08/2020   Procedure: STONE EXTRACTION WITH BASKET;  Surgeon: Kamil Hanigan L, MD;  Location: [facility];  Service: Urology;  Laterality: Right;  . UMBILICAL HERNIA REPAIR N/A 03/25/2014   Procedure: UMBILICAL HERNIA REPAIR;  Surgeon: William S Bradford, MD;  Location: [facility];  Service: General;  Laterality: N/A;  . uterine ablation      Home Medications:  Allergies as of 02/15/2021      Reactions   Ciprofloxacin Hives, Swelling   Penicillins Anaphylaxis, Rash   Codeine Other (See Comments)   had hallicunations   Morphine Nausea And Vomiting, Other (See Comments)   recieved in ED due to kidneystones and had multple doses - gave visual hallucinations and vomitting.   Sulfonamide Derivatives Other (See Comments)   REACTION: Unsure - childhood allergy   Vancomycin Itching      Medication List       Accurate as of February 15, 2021  9:10 AM. If you have any questions, ask your nurse or doctor.        albuterol 108 (90 Base) MCG/ACT inhaler Commonly known as: VENTOLIN HFA Inhale 2 puffs into the lungs every 6 (six) hours as needed for wheezing or shortness of breath.   ALPRAZolam 0.5 MG tablet Commonly known as: XANAX Take 0.5 mg by mouth at bedtime.   ARIPiprazole 2 MG tablet Commonly known as: ABILIFY Take 2 mg by mouth at bedtime.   buPROPion 150 MG 24 hr tablet Commonly known as: WELLBUTRIN XL Take 150 mg by mouth daily.   cyclobenzaprine 5 MG tablet Commonly known as: FLEXERIL Take 1 tablet (5 mg total) by mouth 3 (three) times daily as needed for muscle spasms.   desvenlafaxine 50 MG 24 hr tablet Commonly known as: PRISTIQ Take 50 mg by mouth daily. What changed:  Another medication with the same name was removed. Continue taking this medication, and follow the directions you see here. Changed by: Jehu Mccauslin, MD   furosemide 20 MG tablet Commonly known as: LASIX Take 40 mg by mouth 2 (two) times daily.   gabapentin 600 MG tablet Commonly known as: NEURONTIN Take 600 mg by mouth 2 (two) times daily.   HumaLOG 100 UNIT/ML injection Generic drug: insulin lispro Inject 3.5 Units into the skin continuous. 3.5 units every 1 hr-insulin pump   ibuprofen 200 MG tablet Commonly known as: ADVIL Take 400 mg by mouth every 6 (six) hours as needed for headache or moderate pain.   omeprazole 40 MG capsule Commonly known as: PRILOSEC Take 40 mg by mouth 2 (two) times daily.   ondansetron 4 MG tablet Commonly known as: ZOFRAN Take 1 tablet (4 mg total) by mouth every 8 (eight) hours as needed.   oxybutynin 5 MG tablet Commonly known as: DITROPAN Take 5 mg by mouth at bedtime.   oxyCODONE-acetaminophen 5-325 MG tablet Commonly known as: Percocet Take 1 tablet by mouth every 4 (four) hours as needed for severe pain.   pantoprazole 40 MG   tablet Commonly known as: PROTONIX Take 40 mg by mouth 2 (two) times daily.   Potassium 99 MG Tabs Take 99 mg by mouth daily.   simvastatin 40 MG tablet Commonly known as: ZOCOR Take 40 mg by mouth daily.   tamsulosin 0.4 MG Caps capsule Commonly known as: FLOMAX TAKE 1 CAPSULE(0.4 MG) BY MOUTH DAILY AFTER AND SUPPER What changed:   how much to take  how to take this  when to take this       Allergies:  Allergies  Allergen Reactions  . Ciprofloxacin Hives and Swelling  . Penicillins Anaphylaxis and Rash  . Codeine Other (See Comments)    had hallicunations  . Morphine Nausea And Vomiting and Other (See Comments)    recieved in ED due to kidneystones and had multple doses - gave visual hallucinations and vomitting.  . Sulfonamide Derivatives Other (See Comments)    REACTION: Unsure -  childhood allergy  . Vancomycin Itching    Family History: Family History  Problem Relation Age of Onset  . Diabetes Mother   . Depression Paternal Grandmother   . Depression Paternal Grandfather   . Heart disease Other   . Cancer Other   . Diabetes Other   . Achalasia Other   . Anesthesia problems Neg Hx   . Hypotension Neg Hx   . Malignant hyperthermia Neg Hx   . Pseudochol deficiency Neg Hx     Social History:  reports that she has never smoked. She has never used smokeless tobacco. She reports that she does not drink alcohol and does not use drugs.  ROS: All other review of systems were reviewed and are negative except what is noted above in HPI  Physical Exam: BP (!) 152/77   Pulse 78   Temp 97.9 F (36.6 C)   Ht 4' 8" (1.422 m)   Wt 215 lb (97.5 kg)   LMP 07/16/2012   BMI 48.20 kg/m   Constitutional:  Alert and oriented, No acute distress. HEENT: [place] AT, moist mucus membranes.  Trachea midline, no masses. Cardiovascular: No clubbing, cyanosis, or edema. Respiratory: Normal respiratory effort, no increased work of breathing. GI: Abdomen is soft, nontender, nondistended, no abdominal masses GU: No CVA tenderness.  Lymph: No cervical or inguinal lymphadenopathy. Skin: No rashes, bruises or suspicious lesions. Neurologic: Grossly intact, no focal deficits, moving all 4 extremities. Psychiatric: Normal mood and affect.  Laboratory Data: Lab Results  Component Value Date   WBC 11.4 (H) 05/02/2020   HGB 12.6 05/04/2020   HCT 37.0 05/04/2020   MCV 86.4 05/02/2020   PLT 384 05/02/2020    Lab Results  Component Value Date   CREATININE 0.60 12/26/2020    No results found for: PSA  No results found for: TESTOSTERONE  Lab Results  Component Value Date   HGBA1C 7.6 (H) 11/07/2020    Urinalysis    Component Value Date/Time   COLORURINE AMBER (A) 04/04/2020 0309   APPEARANCEUR Clear 02/01/2021 0924   LABSPEC 1.020 04/04/2020 0309   PHURINE 6.0  04/04/2020 0309   GLUCOSEU Negative 02/01/2021 0924   HGBUR LARGE (A) 04/04/2020 0309   HGBUR moderate 08/31/2008 0908   BILIRUBINUR Negative 02/01/2021 0924   KETONESUR NEGATIVE 04/04/2020 0309   PROTEINUR Negative 02/01/2021 0924   PROTEINUR 100 (A) 04/04/2020 0309   UROBILINOGEN 0.2 12/14/2020 0835   UROBILINOGEN 0.2 09/21/2014 1055   NITRITE Negative 02/01/2021 0924   NITRITE NEGATIVE 04/04/2020 0309   LEUKOCYTESUR Trace (A) 02/01/2021 0924     LEUKOCYTESUR SMALL (A) 04/04/2020 0309    Lab Results  Component Value Date   LABMICR See below: 02/01/2021   WBCUA 6-10 (A) 02/01/2021   LABEPIT >10 (A) 02/01/2021   BACTERIA Few 02/01/2021    Pertinent Imaging: CT 02/01/2021: Images reviewed and discussed with the patient Results for orders placed during the hospital encounter of 10/31/20  DG Abd 1 View  Narrative CLINICAL DATA:  Status post lithotripsy  EXAM: ABDOMEN - 1 VIEW  COMPARISON:  10/10/2020  FINDINGS: Scattered large and small bowel gas is noted. Scattered calculi are noted over the lower pole of the right kidney. The largest of these measures approximately 6 mm. No definitive ureteral stones are noted. Degenerative changes of lumbar spine are seen.  IMPRESSION: Persistent right lower pole renal stones. The largest of these measures approximately 6 mm.   Electronically Signed By: Mark  Lukens M.D. On: 10/31/2020 11:38  No results found for this or any previous visit.  No results found for this or any previous visit.  No results found for this or any previous visit.  Results for orders placed in visit on 01/13/21  Ultrasound renal complete  Narrative CLINICAL DATA:  Left flank pain  EXAM: RENAL / URINARY TRACT ULTRASOUND COMPLETE  COMPARISON:  CT 10/31/2020  FINDINGS: Right Kidney:  Renal measurements: 13.3 x 4.7 x 6.5 cm = volume: 210 mL. Renal cortical echogenicity is normal and cortical thickness has been preserved. An 8 mm  nonobstructing calculus is seen within the lower pole the right kidney. Possible 6 mm nonobstructing calculus within the upper pole of the right kidney. No hydronephrosis. No intrarenal masses.  Left Kidney:  Renal measurements: 12.4 x 5.4 x 5.2 cm = volume: 185 mL. Renal cortical echogenicity is normal and cortical thickness has been preserved. A 5 mm nonobstructing calculus is noted within the lower pole of the left kidney. No hydronephrosis. No intrarenal masses are seen.  Bladder:  Appears normal for degree of bladder distention.  Other:  None.  IMPRESSION: Bilateral nonobstructing nephrolithiasis, similar to that noted on prior CT examination.   Electronically Signed By: Ashesh  Parikh MD On: 01/13/2021 13:47  No results found for this or any previous visit.  No results found for this or any previous visit.  Results for orders placed during the hospital encounter of 02/01/21  CT RENAL STONE STUDY  Narrative CLINICAL DATA:  Left upper quadrant and left flank pain with hematuria for 3 weeks.  EXAM: CT ABDOMEN AND PELVIS WITHOUT CONTRAST  TECHNIQUE: Multidetector CT imaging of the abdomen and pelvis was performed following the standard protocol without IV contrast.  COMPARISON:  10/31/2020 CT abdomen/pelvis.  FINDINGS: Lower chest: No significant pulmonary nodules or acute consolidative airspace disease.  Hepatobiliary: Normal liver size. No liver mass. Cholecystectomy. No biliary ductal dilatation.  Pancreas: Normal, with no mass or duct dilation.  Spleen: Normal size. No mass.  Adrenals/Urinary Tract: Left adrenal 1.1 cm nodule with density 17 HU, stable since 04/04/2020 CT, compatible with an adenoma. Normal right adrenal. Nonobstructing 6 mm and 1 mm lower right renal stones. Multiple (at least 6) nonobstructing left renal stones, largest 4 mm in the interpolar left kidney. Mild fullness of the right renal collecting system without overt  hydronephrosis, unchanged from prior CT. No left hydronephrosis. No contour deforming renal masses. Normal caliber ureters. No ureteral stones. Normal bladder.  Stomach/Bowel: Normal non-distended stomach. Normal caliber small bowel with no small bowel wall thickening. Normal appendix. Normal large bowel with no diverticulosis, large   bowel wall thickening or pericolonic fat stranding.  Vascular/Lymphatic: Mildly atherosclerotic nonaneurysmal abdominal aorta. No pathologically enlarged lymph nodes in the abdomen or pelvis.  Reproductive: Grossly normal uterus.  No adnexal mass.  Other: No pneumoperitoneum, ascites or focal fluid collection.  Musculoskeletal: No aggressive appearing focal osseous lesions. Chronic bilateral L5 pars defects. Moderate thoracolumbar spondylosis.  IMPRESSION: 1. Nonobstructing bilateral nephrolithiasis. No overt hydronephrosis. No ureteral or bladder stones. 2. Stable left adrenal adenoma. 3. Chronic bilateral L5 pars defects. 4. Aortic Atherosclerosis (ICD10-I70.0).   Electronically Signed By: Jason A Poff M.D. On: 02/01/2021 13:06   Assessment & Plan:    1. Kidney stones -We discussed the management of kidney stones. These options include observation, ureteroscopy, shockwave lithotripsy (ESWL) and percutaneous nephrolithotomy (PCNL). We discussed which options are relevant to the patient's stone(s). We discussed the natural history of kidney stones as well as the complications of untreated stones and the impact on quality of life without treatment as well as with each of the above listed treatments. We also discussed the efficacy of each treatment in its ability to clear the stone burden. With any of these management options I discussed the signs and symptoms of infection and the need for emergent treatment should these be experienced. For each option we discussed the ability of each procedure to clear the patient of their stone burden.   For  observation I described the risks which include but are not limited to silent renal damage, life-threatening infection, need for emergent surgery, failure to pass stone and pain.   For ureteroscopy I described the risks which include bleeding, infection, damage to contiguous structures, positioning injury, ureteral stricture, ureteral avulsion, ureteral injury, need for prolonged ureteral stent, inability to perform ureteroscopy, need for an interval procedure, inability to clear stone burden, stent discomfort/pain, heart attack, stroke, pulmonary embolus and the inherent risks with general anesthesia.   For shockwave lithotripsy I described the risks which include arrhythmia, kidney contusion, kidney hemorrhage, need for transfusion, pain, inability to adequately break up stone, inability to pass stone fragments, Steinstrasse, infection associated with obstructing stones, need for alternate surgical procedure, need for repeat shockwave lithotripsy, MI, CVA, PE and the inherent risks with anesthesia/conscious sedation.   For PCNL I described the risks including positioning injury, pneumothorax, hydrothorax, need for chest tube, inability to clear stone burden, renal laceration, arterial venous fistula or malformation, need for embolization of kidney, loss of kidney or renal function, need for repeat procedure, need for prolonged nephrostomy tube, ureteral avulsion, MI, CVA, PE and the inherent risks of general anesthesia.   - The patient would like to proceed with bilateral ureteroscopic stone extraction - Urinalysis, Routine w reflex microscopic   No follow-ups on file.  Ivonne Freeburg, MD  [facility] [place]  

## 2021-02-15 NOTE — Progress Notes (Unsigned)
Urological Symptom Review  Patient is experiencing the following symptoms: Frequent urination Hard to postpone urination Get up at night to urinate Leakage of urine Stream starts and stops Trouble starting stream Have to strain to urinate  Kidney stones   Review of Systems  Gastrointestinal (upper)  : Negative for upper GI symptoms  Gastrointestinal (lower) : Negative for lower GI symptoms  Constitutional : Negative for symptoms  Skin: Negative for skin symptoms  Eyes: Blurred vision  Ear/Nose/Throat : Negative for Ear/Nose/Throat symptoms  Hematologic/Lymphatic: Negative for Hematologic/Lymphatic symptoms  Cardiovascular : Negative for cardiovascular symptoms  Respiratory : Negative for respiratory symptoms  Endocrine: Negative for endocrine symptoms  Musculoskeletal: Negative for musculoskeletal symptoms  Neurological: Negative for neurological symptoms  Psychologic: Depression Anxiety

## 2021-02-16 ENCOUNTER — Encounter: Payer: Self-pay | Admitting: Urology

## 2021-02-16 LAB — BASIC METABOLIC PANEL
BUN/Creatinine Ratio: 20 (ref 9–23)
BUN: 13 mg/dL (ref 6–24)
CO2: 25 mmol/L (ref 20–29)
Calcium: 9.1 mg/dL (ref 8.7–10.2)
Chloride: 105 mmol/L (ref 96–106)
Creatinine, Ser: 0.65 mg/dL (ref 0.57–1.00)
Glucose: 124 mg/dL — ABNORMAL HIGH (ref 65–99)
Potassium: 4.8 mmol/L (ref 3.5–5.2)
Sodium: 144 mmol/L (ref 134–144)
eGFR: 105 mL/min/{1.73_m2} (ref >59)

## 2021-02-16 LAB — PTH, INTACT AND CALCIUM
Calcium: 8.9 mg/dL (ref 8.7–10.2)
PTH: 57 pg/mL (ref 15–65)

## 2021-02-16 LAB — URIC ACID: Uric Acid: 3.9 mg/dL (ref 3.0–7.2)

## 2021-02-16 NOTE — Patient Instructions (Signed)
Ureteroscopy Ureteroscopy is a procedure to check for and treat problems inside part of the urinary tract. In this procedure, a thin, flexible tube with a light at the end (ureteroscope) is used to look at the inside of the kidneys and the ureters. The ureters are the tubes that carry urine from the kidneys to the bladder. The ureteroscope is inserted into one or both of the ureters. You may need this procedure if you have frequent urinary tract infections (UTIs), blood in your urine, or a stone in one of your ureters. A ureteroscopy can be done:  To find the cause of urine blockage in a ureter and to evaluate other abnormalities inside the ureters or kidneys.  To remove stones.  To remove or treat growths of tissue (polyps), abnormal tissue, and some types of tumors.  To remove a tissue sample and check it for disease under a microscope (biopsy). Tell a health care provider about:  Any allergies you have.  All medicines you are taking, including vitamins, herbs, eye drops, creams, and over-the-counter medicines.  Any problems you or family members have had with anesthetic medicines.  Any blood disorders you have.  Any surgeries you have had.  Any medical conditions you have.  Whether you are pregnant or may be pregnant. What are the risks? Generally, this is a safe procedure. However, problems may occur, including:  Bleeding.  Infection.  Allergic reactions to medicines.  Scarring that narrows the ureter (stricture).  Creating a hole in the ureter (perforation). What happens before the procedure? Staying hydrated Follow instructions from your health care provider about hydration, which may include:  Up to 2 hours before the procedure - you may continue to drink clear liquids, such as water, clear fruit juice, black coffee, and plain tea.   Eating and drinking restrictions Follow instructions from your health care provider about eating and drinking, which may include:  8  hours before the procedure - stop eating heavy meals or foods, such as meat, fried foods, or fatty foods.  6 hours before the procedure - stop eating light meals or foods, such as toast or cereal.  6 hours before the procedure - stop drinking milk or drinks that contain milk.  2 hours before the procedure - stop drinking clear liquids. Medicines Ask your health care provider about:  Changing or stopping your regular medicines. This is especially important if you are taking diabetes medicines or blood thinners.  Taking medicines such as aspirin and ibuprofen. These medicines can thin your blood. Do not take these medicines unless your health care provider tells you to take them.  Taking over-the-counter medicines, vitamins, herbs, and supplements. General instructions  Do not use any products that contain nicotine or tobacco for at least 4 weeks before the procedure. These products include cigarettes, e-cigarettes, and chewing tobacco. If you need help quitting, ask your health care provider.  You may have a urine sample taken to check for infection.  Plan to have someone take you home from the hospital or clinic.  If you will be going home right after the procedure, plan to have someone with you for 24 hours.  Ask your health care provider what steps will be taken to help prevent infection. These may include: ? Washing skin with a germ-killing soap. ? Receiving antibiotic medicine. What happens during the procedure?  An IV will be inserted into one of your veins.  You will be given one or more of the following: ? A medicine to help   you relax (sedative). ? A medicine to make you fall asleep (general anesthetic). ? A medicine that is injected into your spine to numb the area below and slightly above the injection site (spinal anesthetic).  The part of your body that drains urine from your bladder (urethra) will be cleaned with a germ-killing solution.  The ureteroscope will be  passed through your urethra into your bladder.  A salt-water solution will be sent through the ureteroscope to fill your bladder. This will help the health care provider see the openings of your ureters more clearly.  The ureteroscope will be passed into your ureter. ? If a growth is found, a biopsy may be done. ? If a stone is found, it may be removed through the ureteroscope, or the stone may be broken up using a laser, shock waves, or electrical energy. ? In some cases, if the ureter is too small, a tube may be inserted that keeps the ureter open (ureteral stent). The stent may be left in place for 1 or 2 weeks to keep the ureter open, and then the ureteroscopy procedure will be done.  The scope will be removed, and your bladder will be emptied. The procedure may vary among health care providers and hospitals.   What can I expect after the procedure? After your procedure, it is common to have:  Your blood pressure, heart rate, breathing rate, and blood oxygen level monitored until you leave the hospital or clinic.  A burning sensation when you urinate. You may be asked to urinate.  Blood in your urine.  Mild discomfort in your bladder area or kidney area when urinating.  A need to urinate more often or urgently. Follow these instructions at home: Medicines  Take over-the-counter and prescription medicines only as told by your health care provider.  If you were prescribed an antibiotic medicine, take it as told by your health care provider. Do not stop taking the antibiotic even if you start to feel better. General instructions  If you were given a sedative during the procedure, it can affect you for several hours. Do not drive or operate machinery until your health care provider says that it is safe.  To relieve burning, take a warm bath or hold a warm washcloth over your groin.  Drink enough fluid to keep your urine pale yellow. ? Drink two 8-ounce (237 mL) glasses of water  every hour for the first 2 hours after you get home. ? Continue to drink water often at home.  You can eat what you normally do.  Keep all follow-up visits as told by your health care provider. This is important. ? If you had a ureteral stent placed, ask your health care provider when you need to return to have it removed.   Contact a health care provider if you have:  Chills or a fever.  Burning pain for longer than 24 hours after the procedure.  Blood in your urine for longer than 24 hours after the procedure. Get help right away if you have:  Large amounts of blood in your urine.  Blood clots in your urine.  Severe pain.  Chest pain or trouble breathing.  The feeling of a full bladder and you are unable to urinate. These symptoms may represent a serious problem that is an emergency. Do not wait to see if the symptoms will go away. Get medical help right away. Call your local emergency services (911 in the [place] Summary  Ureteroscopy is a procedure to   check for and treat problems inside part of the urinary tract.  In this procedure, a thin, flexible tube with a light at the end (ureteroscope) is used to look at the inside of the kidneys and the ureters.  You may need this procedure if you have frequent urinary tract infections (UTIs), blood in your urine, or a stone in a ureter. This information is not intended to replace advice given to you by your health care provider. Make sure you discuss any questions you have with your health care provider. Document Revised: 09/02/2019 Document Reviewed: 09/02/2019 Elsevier Patient Education  2021 [employer]  

## 2021-02-27 ENCOUNTER — Telehealth: Payer: Self-pay

## 2021-02-27 NOTE — Telephone Encounter (Signed)
Patient made aware of need for recollection of 24 hrs urine. Patient voiced understanding.

## 2021-03-10 NOTE — Patient Instructions (Signed)
[name]  03/10/2021     @PREFPERIOPPHARMACY @   Your procedure is scheduled on  03/16/2021.   Report to 05/16/2021 at  0800  A.M.   Call this number if you have problems the morning of surgery:  [phone]   Remember:  Do not eat or drink after midnight.                       Take these medicines the morning of surgery with A SIP OF WATER  Wellbutrin, pristiq, gabapentin, prilosec, zofran (if needed), protonix.  Use your inhaler before you come and bring your rescue inhaler with you.  Dial your insulin pump down to your basal rate at bedtime 03/15/2021. Bring extra supplies for your pump with you in case it becomes disconnected.  If your glucose is 70 or below the morning of your procedure, drink 1/2 cup of clear juice and recheck your glucose in 15 minutes. If your glucose is still 70 or below, call [phone] for instructions.  If your glucose is 300 or above the morning of your procedure, call [phone] for instructions.       Do not wear jewelry, make-up or nail polish.  Do not wear lotions, powders, or perfumes, or deodorant.  Do not shave 48 hours prior to surgery.  Men may shave face and neck.  Do not bring valuables to the hospital.  [facility] is not responsible for any belongings or valuables.   Contacts, dentures or bridgework may not be worn into surgery.  Leave your suitcase in the car.  After surgery it may be brought to your room.  For patients admitted to the hospital, discharge time will be determined by your treatment team.  Patients discharged the day of surgery will not be allowed to drive home and must have someone with them for 24 hours.  Place clean sheets on your bed the night before your procedure and DO NOT sleep with pets this night.  Shower with CHG the night before and the morning of your procedure. DO NOT put CHG on your face, hair or genitals.  After each shower, dry off with a clean towel, put on clean, comfortable  clothes and brush your teeth. DO NOT use lotions, powders or colognes on your skin after you bathe with CHG.     Special instructions:  DO NOT smoke tobacco or vape the morning of your procedure.  Please read over the following fact sheets that you were given. Anesthesia Post-op Instructions and Care and Recovery After Surgery       Ureteral Stent Implantation, Care After This sheet gives you information about how to care for yourself after your procedure. Your health care provider may also give you more specific instructions. If you have problems or questions, contact your health care provider. What can I expect after the procedure? After the procedure, it is common to have:  Nausea.  Mild pain when you urinate. You may feel this pain in your lower back or lower abdomen. The pain should stop within a few minutes after you urinate. This may last for up to 1 week.  A small amount of blood in your urine for several days. Follow these instructions at home: Medicines  Take over-the-counter and prescription medicines only as told by your health care provider.  If you were prescribed an antibiotic medicine, take it as told by your health care provider. Do not stop taking the antibiotic  even if you start to feel better.  Do not drive for 24 hours if you were given a sedative during your procedure.  Ask your health care provider if the medicine prescribed to you requires you to avoid driving or using heavy machinery. Activity  Rest as told by your health care provider.  Avoid sitting for a long time without moving. Get up to take short walks every 1-2 hours. This is important to improve blood flow and breathing. Ask for help if you feel weak or unsteady.  Return to your normal activities as told by your health care provider. Ask your health care provider what activities are safe for you. General instructions  Watch for any blood in your urine. Call your health care provider if the  amount of blood in your urine increases.  If you have a catheter: ? Follow instructions from your health care provider about taking care of your catheter and collection bag. ? Do not take baths, swim, or use a hot tub until your health care provider approves. Ask your health care provider if you may take showers. You may only be allowed to take sponge baths.  Drink enough fluid to keep your urine pale yellow.  Do not use any products that contain nicotine or tobacco, such as cigarettes, e-cigarettes, and chewing tobacco. These can delay healing after surgery. If you need help quitting, ask your health care provider.  Keep all follow-up visits as told by your health care provider. This is important.   Contact a health care provider if:  You have pain that gets worse or does not get better with medicine, especially pain when you urinate.  You have difficulty urinating.  You feel nauseous or you vomit repeatedly during a period of more than 2 days after the procedure. Get help right away if:  Your urine is dark red or has blood clots in it.  You are leaking urine (have incontinence).  The end of the stent comes out of your urethra.  You cannot urinate.  You have sudden, sharp, or severe pain in your abdomen or lower back.  You have a fever.  You have swelling or pain in your legs.  You have difficulty breathing. Summary  After the procedure, it is common to have mild pain when you urinate that goes away within a few minutes after you urinate. This may last for up to 1 week.  Watch for any blood in your urine. Call your health care provider if the amount of blood in your urine increases.  Take over-the-counter and prescription medicines only as told by your health care provider.  Drink enough fluid to keep your urine pale yellow. This information is not intended to replace advice given to you by your health care provider. Make sure you discuss any questions you have with your  health care provider. Document Revised: 09/02/2018 Document Reviewed: 09/03/2018 Elsevier Patient Education  2021 [employer] General Anesthesia, Adult, Care After This sheet gives you information about how to care for yourself after your procedure. Your health care provider may also give you more specific instructions. If you have problems or questions, contact your health care provider. What can I expect after the procedure? After the procedure, the following side effects are common:  Pain or discomfort at the IV site.  Nausea.  Vomiting.  Sore throat.  Trouble concentrating.  Feeling cold or chills.  Feeling weak or tired.  Sleepiness and fatigue.  Soreness and body aches. These side effects can  affect parts of the body that were not involved in surgery. Follow these instructions at home: For the time period you were told by your health care provider:  Rest.  Do not participate in activities where you could fall or become injured.  Do not drive or use machinery.  Do not drink alcohol.  Do not take sleeping pills or medicines that cause drowsiness.  Do not make important decisions or sign legal documents.  Do not take care of children on your own.   Eating and drinking  Follow any instructions from your health care provider about eating or drinking restrictions.  When you feel hungry, start by eating small amounts of foods that are soft and easy to digest (bland), such as toast. Gradually return to your regular diet.  Drink enough fluid to keep your urine pale yellow.  If you vomit, rehydrate by drinking water, juice, or clear broth. General instructions  If you have sleep apnea, surgery and certain medicines can increase your risk for breathing problems. Follow instructions from your health care provider about wearing your sleep device: ? Anytime you are sleeping, including during daytime naps. ? While taking prescription pain medicines, sleeping medicines,  or medicines that make you drowsy.  Have a responsible adult stay with you for the time you are told. It is important to have someone help care for you until you are awake and alert.  Return to your normal activities as told by your health care provider. Ask your health care provider what activities are safe for you.  Take over-the-counter and prescription medicines only as told by your health care provider.  If you smoke, do not smoke without supervision.  Keep all follow-up visits as told by your health care provider. This is important. Contact a health care provider if:  You have nausea or vomiting that does not get better with medicine.  You cannot eat or drink without vomiting.  You have pain that does not get better with medicine.  You are unable to pass urine.  You develop a skin rash.  You have a fever.  You have redness around your IV site that gets worse. Get help right away if:  You have difficulty breathing.  You have chest pain.  You have blood in your urine or stool, or you vomit blood. Summary  After the procedure, it is common to have a sore throat or nausea. It is also common to feel tired.  Have a responsible adult stay with you for the time you are told. It is important to have someone help care for you until you are awake and alert.  When you feel hungry, start by eating small amounts of foods that are soft and easy to digest (bland), such as toast. Gradually return to your regular diet.  Drink enough fluid to keep your urine pale yellow.  Return to your normal activities as told by your health care provider. Ask your health care provider what activities are safe for you. This information is not intended to replace advice given to you by your health care provider. Make sure you discuss any questions you have with your health care provider. Document Revised: 08/11/2020 Document Reviewed: 03/10/2020 Elsevier Patient Education  2021 [employer].

## 2021-03-13 ENCOUNTER — Other Ambulatory Visit (HOSPITAL_COMMUNITY): Payer: Self-pay | Admitting: Internal Medicine

## 2021-03-13 DIAGNOSIS — R922 Inconclusive mammogram: Secondary | ICD-10-CM

## 2021-03-14 ENCOUNTER — Telehealth: Payer: Self-pay | Admitting: *Deleted

## 2021-03-14 MED ORDER — LINACLOTIDE 145 MCG PO CAPS
145.0000 ug | ORAL_CAPSULE | Freq: Every day | ORAL | 3 refills | Status: DC
  Filled 2021-06-29: qty 90, 90d supply, fill #0

## 2021-03-14 NOTE — Addendum Note (Signed)
Addended by: Gelene Mink on: 03/14/2021 02:48 PM   Modules accepted: Orders

## 2021-03-14 NOTE — Telephone Encounter (Signed)
Completed.

## 2021-03-14 NOTE — Telephone Encounter (Signed)
Would like RX for Linzess 145 mcg.  Samples worked good.  Uses [facility] [address]

## 2021-03-15 ENCOUNTER — Other Ambulatory Visit: Payer: Self-pay

## 2021-03-15 ENCOUNTER — Other Ambulatory Visit (HOSPITAL_COMMUNITY)
Admission: RE | Admit: 2021-03-15 | Discharge: 2021-03-15 | Disposition: A | Discharge status: Home / Self Care | Payer: Commercial Managed Care - PPO | Source: Ambulatory Visit | Attending: Urology | Admitting: Urology

## 2021-03-15 ENCOUNTER — Encounter (HOSPITAL_COMMUNITY)
Admission: RE | Admit: 2021-03-15 | Discharge: 2021-03-15 | Disposition: A | Discharge status: Home / Self Care | Payer: Commercial Managed Care - PPO | Source: Ambulatory Visit | Attending: Urology | Admitting: Urology

## 2021-03-15 ENCOUNTER — Encounter (HOSPITAL_COMMUNITY): Payer: Self-pay

## 2021-03-15 DIAGNOSIS — Z01818 Encounter for other preprocedural examination: Secondary | ICD-10-CM | POA: Diagnosis present

## 2021-03-15 DIAGNOSIS — Z20822 Contact with and (suspected) exposure to covid-19: Secondary | ICD-10-CM | POA: Insufficient documentation

## 2021-03-15 LAB — SARS CORONAVIRUS 2 (TAT 6-24 HRS): SARS Coronavirus 2: NEGATIVE

## 2021-03-15 LAB — HEMOGLOBIN A1C
Hgb A1c MFr Bld: 7 % — ABNORMAL HIGH (ref 4.8–5.6)
Mean Plasma Glucose: 154.2 mg/dL

## 2021-03-15 MED ORDER — GENTAMICIN SULFATE 40 MG/ML IJ SOLN
5.0000 mg/kg | INTRAVENOUS | Status: AC
  Administered 2021-03-16: 331.6 mg via INTRAVENOUS
  Filled 2021-03-15: qty 8.25

## 2021-03-16 ENCOUNTER — Ambulatory Visit (HOSPITAL_COMMUNITY): Payer: Commercial Managed Care - PPO

## 2021-03-16 ENCOUNTER — Encounter (HOSPITAL_COMMUNITY)
Admission: RE | Disposition: A | Discharge status: Home / Self Care | Payer: Self-pay | Source: Home / Self Care | Attending: Urology

## 2021-03-16 ENCOUNTER — Ambulatory Visit (HOSPITAL_COMMUNITY)
Admission: RE | Admit: 2021-03-16 | Discharge: 2021-03-16 | Disposition: A | Discharge status: Home / Self Care | Payer: Commercial Managed Care - PPO | Attending: Urology | Admitting: Urology

## 2021-03-16 ENCOUNTER — Encounter (HOSPITAL_COMMUNITY): Payer: Self-pay | Admitting: Urology

## 2021-03-16 ENCOUNTER — Ambulatory Visit (HOSPITAL_COMMUNITY): Payer: Commercial Managed Care - PPO | Admitting: Anesthesiology

## 2021-03-16 DIAGNOSIS — Z880 Allergy status to penicillin: Secondary | ICD-10-CM | POA: Diagnosis not present

## 2021-03-16 DIAGNOSIS — N200 Calculus of kidney: Secondary | ICD-10-CM | POA: Diagnosis not present

## 2021-03-16 DIAGNOSIS — Z881 Allergy status to other antibiotic agents status: Secondary | ICD-10-CM | POA: Diagnosis not present

## 2021-03-16 HISTORY — PX: CYSTOSCOPY WITH RETROGRADE PYELOGRAM, URETEROSCOPY AND STENT PLACEMENT: SHX5789

## 2021-03-16 LAB — GLUCOSE, CAPILLARY
Glucose-Capillary: 122 mg/dL — ABNORMAL HIGH (ref 70–99)
Glucose-Capillary: 91 mg/dL (ref 70–99)
Glucose-Capillary: 147 mg/dL — ABNORMAL HIGH (ref 70–99)
Glucose-Capillary: 154 mg/dL — ABNORMAL HIGH (ref 70–99)

## 2021-03-16 SURGERY — CYSTOURETEROSCOPY, WITH RETROGRADE PYELOGRAM AND STENT INSERTION
Anesthesia: General | Site: Ureter | Laterality: Bilateral

## 2021-03-16 MED ORDER — OXYCODONE-ACETAMINOPHEN 5-325 MG PO TABS: 1.0000 | ORAL_TABLET | ORAL | 0 refills | Status: DC | PRN

## 2021-03-16 MED ORDER — LACTATED RINGERS IV SOLN
INTRAVENOUS | Status: DC
  Administered 2021-03-16: 1000 mL via INTRAVENOUS

## 2021-03-16 MED ORDER — LIDOCAINE HCL (CARDIAC) PF 50 MG/5ML IV SOSY
PREFILLED_SYRINGE | INTRAVENOUS | Status: DC | PRN
  Administered 2021-03-16: 80 mg via INTRAVENOUS

## 2021-03-16 MED ORDER — DEXTROSE 50 % IV SOLN
INTRAVENOUS | Status: AC
  Filled 2021-03-16: qty 50

## 2021-03-16 MED ORDER — DIATRIZOATE MEGLUMINE 30 % UR SOLN
URETHRAL | Status: DC | PRN
  Administered 2021-03-16: 15 mL via URETHRAL

## 2021-03-16 MED ORDER — MIDAZOLAM HCL 5 MG/5ML IJ SOLN
INTRAMUSCULAR | Status: DC | PRN
  Administered 2021-03-16: 2 mg via INTRAVENOUS

## 2021-03-16 MED ORDER — PROPOFOL 10 MG/ML IV BOLUS
INTRAVENOUS | Status: AC
  Filled 2021-03-16: qty 80

## 2021-03-16 MED ORDER — LIDOCAINE HCL (PF) 2 % IJ SOLN
INTRAMUSCULAR | Status: AC
  Filled 2021-03-16: qty 20

## 2021-03-16 MED ORDER — EPHEDRINE 5 MG/ML INJ
INTRAVENOUS | Status: AC
  Filled 2021-03-16: qty 20

## 2021-03-16 MED ORDER — PHENYLEPHRINE 40 MCG/ML (10ML) SYRINGE FOR IV PUSH (FOR BLOOD PRESSURE SUPPORT)
PREFILLED_SYRINGE | INTRAVENOUS | Status: AC
  Filled 2021-03-16: qty 10

## 2021-03-16 MED ORDER — DIATRIZOATE MEGLUMINE 30 % UR SOLN
URETHRAL | Status: AC
  Filled 2021-03-16: qty 100

## 2021-03-16 MED ORDER — FENTANYL CITRATE (PF) 100 MCG/2ML IJ SOLN
INTRAMUSCULAR | Status: AC
  Filled 2021-03-16: qty 2

## 2021-03-16 MED ORDER — HYDROMORPHONE HCL 1 MG/ML IJ SOLN
INTRAMUSCULAR | Status: AC
  Filled 2021-03-16: qty 0.5

## 2021-03-16 MED ORDER — FENTANYL CITRATE (PF) 100 MCG/2ML IJ SOLN
INTRAMUSCULAR | Status: DC | PRN
  Administered 2021-03-16 (×2): 50 ug via INTRAVENOUS

## 2021-03-16 MED ORDER — FENTANYL CITRATE (PF) 100 MCG/2ML IJ SOLN
25.0000 ug | INTRAMUSCULAR | Status: DC | PRN
  Administered 2021-03-16 (×3): 50 ug via INTRAVENOUS
  Filled 2021-03-16 (×2): qty 2

## 2021-03-16 MED ORDER — PROPOFOL 10 MG/ML IV BOLUS
INTRAVENOUS | Status: DC | PRN
  Administered 2021-03-16: 200 mg via INTRAVENOUS

## 2021-03-16 MED ORDER — KETOROLAC TROMETHAMINE 30 MG/ML IJ SOLN
INTRAMUSCULAR | Status: AC
  Filled 2021-03-16: qty 1

## 2021-03-16 MED ORDER — SUCCINYLCHOLINE 20MG/ML (10ML) SYRINGE FOR MEDFUSION PUMP - OPTIME
INTRAMUSCULAR | Status: DC | PRN
  Administered 2021-03-16: 120 mg via INTRAVENOUS

## 2021-03-16 MED ORDER — CHLORHEXIDINE GLUCONATE 0.12 % MT SOLN: 15.0000 mL | Freq: Once | OROMUCOSAL | Status: AC

## 2021-03-16 MED ORDER — ONDANSETRON HCL 4 MG/2ML IJ SOLN
INTRAMUSCULAR | Status: AC
  Filled 2021-03-16: qty 4

## 2021-03-16 MED ORDER — ORAL CARE MOUTH RINSE
15.0000 mL | Freq: Once | OROMUCOSAL | Status: AC
  Administered 2021-03-16: 15 mL via OROMUCOSAL

## 2021-03-16 MED ORDER — WATER FOR IRRIGATION, STERILE IR SOLN
Status: DC | PRN
  Administered 2021-03-16: 500 mL

## 2021-03-16 MED ORDER — SODIUM CHLORIDE 0.9 % IR SOLN
Status: DC | PRN
  Administered 2021-03-16 (×2): 3000 mL

## 2021-03-16 MED ORDER — BELLADONNA ALKALOIDS-OPIUM 16.2-60 MG RE SUPP
1.0000 | Freq: Once | RECTAL | Status: AC
Start: 2021-03-16 — End: 2021-03-16
  Administered 2021-03-16: 1 via RECTAL
  Filled 2021-03-16: qty 1

## 2021-03-16 MED ORDER — ONDANSETRON HCL 4 MG/2ML IJ SOLN: 4.0000 mg | Freq: Once | INTRAMUSCULAR | Status: DC | PRN

## 2021-03-16 MED ORDER — HYDROMORPHONE HCL 1 MG/ML IJ SOLN
0.5000 mg | Freq: Once | INTRAMUSCULAR | Status: AC
  Administered 2021-03-16: 0.5 mg via INTRAVENOUS

## 2021-03-16 MED ORDER — MIDAZOLAM HCL 2 MG/2ML IJ SOLN
INTRAMUSCULAR | Status: AC
  Filled 2021-03-16: qty 4

## 2021-03-16 MED ORDER — PHENYLEPHRINE HCL (PRESSORS) 10 MG/ML IV SOLN
INTRAVENOUS | Status: DC | PRN
  Administered 2021-03-16: 80 ug via INTRAVENOUS

## 2021-03-16 MED ORDER — ONDANSETRON HCL 4 MG/2ML IJ SOLN
INTRAMUSCULAR | Status: DC | PRN
  Administered 2021-03-16: 4 mg via INTRAVENOUS

## 2021-03-16 MED ORDER — DEXTROSE 50 % IV SOLN
25.0000 mL | Freq: Once | INTRAVENOUS | Status: AC
  Administered 2021-03-16: 25 mL via INTRAVENOUS

## 2021-03-16 MED ORDER — DEXAMETHASONE SODIUM PHOSPHATE 10 MG/ML IJ SOLN
INTRAMUSCULAR | Status: AC
  Filled 2021-03-16: qty 2

## 2021-03-16 SURGICAL SUPPLY — 26 items
BAG DRAIN URO TABLE W/ADPT NS (BAG) ×3 IMPLANT
BAG DRN 8 ADPR NS SKTRN CSTL (BAG) ×2
BAG HAMPER (MISCELLANEOUS) ×3 IMPLANT
CATH INTERMIT  6FR 70CM (CATHETERS) ×3 IMPLANT
CLOTH BEACON ORANGE TIMEOUT ST (SAFETY) ×3 IMPLANT
DECANTER SPIKE VIAL GLASS SM (MISCELLANEOUS) ×3 IMPLANT
EXTRACTOR STONE NITINOL NGAGE (UROLOGICAL SUPPLIES) ×3 IMPLANT
GLOVE BIO SURGEON STRL SZ8 (GLOVE) ×3 IMPLANT
GLOVE SURG UNDER POLY LF SZ7 (GLOVE) ×6 IMPLANT
GOWN STRL REUS W/TWL LRG LVL3 (GOWN DISPOSABLE) ×3 IMPLANT
GOWN STRL REUS W/TWL XL LVL3 (GOWN DISPOSABLE) ×3 IMPLANT
GUIDEWIRE STR DUAL SENSOR (WIRE) ×3 IMPLANT
GUIDEWIRE STR ZIPWIRE 035X150 (MISCELLANEOUS) ×3 IMPLANT
IV NS IRRIG 3000ML ARTHROMATIC (IV SOLUTION) ×6 IMPLANT
KIT TURNOVER CYSTO (KITS) ×3 IMPLANT
MANIFOLD NEPTUNE II (INSTRUMENTS) ×3 IMPLANT
PACK CYSTO (CUSTOM PROCEDURE TRAY) ×3 IMPLANT
PAD ARMBOARD 7.5X6 YLW CONV (MISCELLANEOUS) ×3 IMPLANT
SHEATH URETERAL 12FRX35CM (MISCELLANEOUS) ×2 IMPLANT
STENT URET 6FRX24 CONTOUR (STENTS) ×2 IMPLANT
SYR 10ML LL (SYRINGE) ×3 IMPLANT
SYR CONTROL 10ML LL (SYRINGE) ×3 IMPLANT
TOWEL OR 17X26 4PK STRL BLUE (TOWEL DISPOSABLE) ×3 IMPLANT
TRACTIP FLEXIVA PULS ID 200XHI (Laser) ×2 IMPLANT
TRACTIP FLEXIVA PULSE ID 200 (Laser) ×3
WATER STERILE IRR 500ML POUR (IV SOLUTION) ×3 IMPLANT

## 2021-03-16 NOTE — Discharge Instructions (Signed)
Ureteral Stent Implantation, Care After This sheet gives you information about how to care for yourself after your procedure. Your health care provider may also give you more specific instructions. If you have problems or questions, contact your health care provider. What can I expect after the procedure? After the procedure, it is common to have:  Nausea.  Mild pain when you urinate. You may feel this pain in your lower back or lower abdomen. The pain should stop within a few minutes after you urinate. This may last for up to 1 week.  A small amount of blood in your urine for several days. Follow these instructions at home: Medicines  Take over-the-counter and prescription medicines only as told by your health care provider.  If you were prescribed an antibiotic medicine, take it as told by your health care provider. Do not stop taking the antibiotic even if you start to feel better.  Do not drive for 24 hours if you were given a sedative during your procedure.  Ask your health care provider if the medicine prescribed to you requires you to avoid driving or using heavy machinery. Activity  Rest as told by your health care provider.  Avoid sitting for a long time without moving. Get up to take short walks every 1-2 hours. This is important to improve blood flow and breathing. Ask for help if you feel weak or unsteady.  Return to your normal activities as told by your health care provider. Ask your health care provider what activities are safe for you. General instructions  Watch for any blood in your urine. Call your health care provider if the amount of blood in your urine increases.  If you have a catheter: ? Follow instructions from your health care provider about taking care of your catheter and collection bag. ? Do not take baths, swim, or use a hot tub until your health care provider approves. Ask your health care provider if you may take showers. You may only be allowed to  take sponge baths.  Drink enough fluid to keep your urine pale yellow.  Do not use any products that contain nicotine or tobacco, such as cigarettes, e-cigarettes, and chewing tobacco. These can delay healing after surgery. If you need help quitting, ask your health care provider.  Keep all follow-up visits as told by your health care provider. This is important.   Contact a health care provider if:  You have pain that gets worse or does not get better with medicine, especially pain when you urinate.  You have difficulty urinating.  You feel nauseous or you vomit repeatedly during a period of more than 2 days after the procedure. Get help right away if:  Your urine is dark red or has blood clots in it.  You are leaking urine (have incontinence).  The end of the stent comes out of your urethra.  You cannot urinate.  You have sudden, sharp, or severe pain in your abdomen or lower back.  You have a fever.  You have swelling or pain in your legs.  You have difficulty breathing. Summary  After the procedure, it is common to have mild pain when you urinate that goes away within a few minutes after you urinate. This may last for up to 1 week.  Watch for any blood in your urine. Call your health care provider if the amount of blood in your urine increases.  Take over-the-counter and prescription medicines only as told by your health care provider.  Drink enough fluid to keep your urine pale yellow. This information is not intended to replace advice given to you by your health care provider. Make sure you discuss any questions you have with your health care provider. Document Revised: 09/02/2018 Document Reviewed: 09/03/2018 Elsevier Patient Education  2021 [employer]  PLEASE REMOVE YOUR STENT IN 72 HOURS BY GENTLY PULLING THE STRING  Monitored Anesthesia Care, Care After This sheet gives you information about how to care for yourself after your procedure. Your health care  provider may also give you more specific instructions. If you have problems or questions, contact your health care provider. What can I expect after the procedure? After the procedure, it is common to have:  Tiredness.  Forgetfulness about what happened after the procedure.  Impaired judgment for important decisions.  Nausea or vomiting.  Some difficulty with balance. Follow these instructions at home: For the time period you were told by your health care provider:  Rest as needed.  Do not participate in activities where you could fall or become injured.  Do not drive or use machinery.  Do not drink alcohol.  Do not take sleeping pills or medicines that cause drowsiness.  Do not make important decisions or sign legal documents.  Do not take care of children on your own.      Eating and drinking  Follow the diet that is recommended by your health care provider.  Drink enough fluid to keep your urine pale yellow.  If you vomit: ? Drink water, juice, or soup when you can drink without vomiting. ? Make sure you have little or no nausea before eating solid foods. General instructions  Have a responsible adult stay with you for the time you are told. It is important to have someone help care for you until you are awake and alert.  Take over-the-counter and prescription medicines only as told by your health care provider.  If you have sleep apnea, surgery and certain medicines can increase your risk for breathing problems. Follow instructions from your health care provider about wearing your sleep device: ? Anytime you are sleeping, including during daytime naps. ? While taking prescription pain medicines, sleeping medicines, or medicines that make you drowsy.  Avoid smoking.  Keep all follow-up visits as told by your health care provider. This is important. Contact a health care provider if:  You keep feeling nauseous or you keep vomiting.  You feel  light-headed.  You are still sleepy or having trouble with balance after 24 hours.  You develop a rash.  You have a fever.  You have redness or swelling around the IV site. Get help right away if:  You have trouble breathing.  You have new-onset confusion at home. Summary  For several hours after your procedure, you may feel tired. You may also be forgetful and have poor judgment.  Have a responsible adult stay with you for the time you are told. It is important to have someone help care for you until you are awake and alert.  Rest as told. Do not drive or operate machinery. Do not drink alcohol or take sleeping pills.  Get help right away if you have trouble breathing, or if you suddenly become confused. This information is not intended to replace advice given to you by your health care provider. Make sure you discuss any questions you have with your health care provider. Document Revised: 08/11/2020 Document Reviewed: 10/29/2019 Elsevier Patient Education  2021 [employer].

## 2021-03-16 NOTE — Op Note (Signed)
Marland KitchenPreoperative diagnosis: bilateral renal calculi  Postoperative diagnosis: Same  Procedure: 1 cystoscopy 2. Bilateral retrograde pyelography 3.  Intraoperative fluoroscopy, under one hour, with interpretation 4.  Bilateral ureteroscopic stone manipulation with basket extraction 5.  left 6 x 24 JJ stent placement  Attending: Cleda Mccreedy  Anesthesia: General  Estimated blood loss: None  Drains: left 6 x 24 JJ ureteral stent with tether  Specimens: stone for analysis  Antibiotics: Gentamicin  Findings: bilateral mid and lower pole renal calculi. No hydronephrosis. No masses/lesions in the bladder. Ureteral orifices in normal anatomic location.  Indications: Patient is a 55 year old female with a history of bilateral renal calculi and bilateral flank pain. After discussing treatment options, they decided proceed with bilateral ureteroscopic stone manipulation.  Procedure her in detail: The patient was brought to the operating room and a brief timeout was done to ensure correct patient, correct procedure, correct site.  General anesthesia was administered patient was placed in dorsal lithotomy position.  Her genitalia was then prepped and draped in usual sterile fashion.  A rigid 22 French cystoscope was passed in the urethra and the bladder.  Bladder was inspected free masses or lesions.  the ureteral orifices were in the normal orthotopic locations. a 6 french ureteral catheter was then instilled into the left ureteral orifice.  a gentle retrograde was obtained and findings noted above. We then advanced a zipwire through the catheter and up to the renal pelvis.  we then removed the cystoscope and cannulated the left ureteral orifice with a semirigid ureteroscope.  We located no stone in the ureter. We then placed a sensor wire up to the renal pelvis. We removed the scope and advanced a 12/14 x 35cm access sheath up to the renal pelvis. We then used the flexible ureteroscope to perform  nephroscopy. We located calculi in  the mid and lower poles which were removed with an NGage basket. Once the stone were removed we then removed the access sheath under direct vision and noted to injury to the ureter.  We then placed a 6 x 24 double-j ureteral stent over the original zip wire. We then removed the wire and good coil was noted in the the renal pelvis under fluoroscopy and the bladder under direct vision.  We then turned out attention to the right side. a 6 french ureteral catheter was then instilled into the right ureteral orifice.  a gentle retrograde was obtained and findings noted above We then advanced a zipwire through the catheter and up to the renal pelvis.  we then removed the cystoscope and cannulated the right ureteral orifice with a semirigid ureteroscope.  We located no stone in the ureter. We then placed a sensor wire up to the renal pelvis. We removed the scope and advanced a 12/14 x 35cm access sheath up to the renal pelvis. We then used the flexible ureteroscope to perform nephroscopy. We located calculi in the mid and lower poles which were removed with an NGage basket. Once the stone were removed we then removed the access sheath under direct vision and noted to injury to the ureter. we then placed a 6 x 24 double-j ureteral stent over the original zip wire.  We then removed the wire and good coil was noted in the the renal pelvis under fluoroscopy and the bladder under direct vision.   the bladder was then drained and this concluded the procedure which was well tolerated by patient.  Complications: None  Condition: Stable, extubated, transferred to PACU  Plan:  Patient is to be discharged home as to follow-up in 2 weeks. She is to remove her stent by pulling the tether in 72 hours

## 2021-03-16 NOTE — Transfer of Care (Signed)
Immediate Anesthesia Transfer of Care Note  Patient: [name]  Procedure(s) Performed: CYSTOSCOPY WITH BILATERAL  RETROGRADE PYELOGRAM, BILATERAL URETEROSCOPY AND STENT PLACEMENT ON THE LEFT (Bilateral Ureter)  Patient Location: PACU  Anesthesia Type:General  Level of Consciousness: awake  Airway & Oxygen Therapy: Patient Spontanous Breathing  Post-op Assessment: Report given to RN  Post vital signs: Reviewed and stable  Last Vitals:  Vitals Value Taken Time  BP 132/75 03/16/21 1105  Temp 36.8 C 03/16/21 1102  Pulse 87 03/16/21 1109  Resp 17 03/16/21 1109  SpO2 95 % 03/16/21 1109  Vitals shown include unvalidated device data.  Last Pain:  Vitals:   03/16/21 0816  TempSrc: Oral  PainSc: 8       Patients Stated Pain Goal: 9 (03/16/21 0816)  Complications: No complications documented.

## 2021-03-16 NOTE — Anesthesia Procedure Notes (Signed)
Procedure Name: Intubation Date/Time: 03/16/2021 9:52 AM Performed by: Ollen Bowl, CRNA Pre-anesthesia Checklist: Patient identified, Patient being monitored, Timeout performed, Emergency Drugs available and Suction available Patient Re-evaluated:Patient Re-evaluated prior to induction Oxygen Delivery Method: Circle system utilized Preoxygenation: Pre-oxygenation with 100% oxygen Induction Type: IV induction Ventilation: Mask ventilation without difficulty Laryngoscope Size: Mac and 3 Grade View: Grade I Tube type: Oral Tube size: 7.0 mm Number of attempts: 1 Airway Equipment and Method: Stylet Placement Confirmation: ETT inserted through vocal cords under direct vision,  positive ETCO2 and breath sounds checked- equal and bilateral Secured at: 21 cm Tube secured with: Tape Dental Injury: Teeth and Oropharynx as per pre-operative assessment

## 2021-03-16 NOTE — Anesthesia Postprocedure Evaluation (Signed)
Anesthesia Post Note  Patient: [name]  Procedure(s) Performed: CYSTOSCOPY WITH BILATERAL  RETROGRADE PYELOGRAM, BILATERAL URETEROSCOPY AND STENT PLACEMENT ON THE LEFT (Bilateral Ureter)  Patient location during evaluation: PACU Anesthesia Type: General Level of consciousness: awake and alert Pain management: pain level controlled Vital Signs Assessment: post-procedure vital signs reviewed and stable Respiratory status: spontaneous breathing Cardiovascular status: blood pressure returned to baseline and stable Postop Assessment: no apparent nausea or vomiting Anesthetic complications: no   No complications documented.   Last Vitals:  Vitals:   03/16/21 1127 03/16/21 1130  BP:  132/78  Pulse: 83 84  Resp: 12 13  Temp:    SpO2: 97% 96%    Last Pain:  Vitals:   03/16/21 1145  TempSrc:   PainSc: 10-Worst pain ever                 Lailana Shira

## 2021-03-16 NOTE — Anesthesia Preprocedure Evaluation (Signed)
Anesthesia Evaluation  Patient identified by MRN, date of birth, ID band Patient awake    Reviewed: Allergy & Precautions, H&P , NPO status , Patient's Chart, lab work & pertinent test results, reviewed documented beta blocker date and time   Airway Mallampati: II  TM Distance: >3 FB Neck ROM: full    Dental no notable dental hx.    Pulmonary shortness of breath, sleep apnea ,    Pulmonary exam normal breath sounds clear to auscultation       Cardiovascular Exercise Tolerance: Good hypertension, negative cardio ROS   Rhythm:regular Rate:Normal     Neuro/Psych  Headaches, PSYCHIATRIC DISORDERS Depression    GI/Hepatic Neg liver ROS, GERD  Medicated,  Endo/Other  diabetesMorbid obesity  Renal/GU Renal disease  negative genitourinary   Musculoskeletal   Abdominal   Peds  Hematology negative hematology ROS (+)   Anesthesia Other Findings   Reproductive/Obstetrics negative OB ROS                             Anesthesia Physical  Anesthesia Plan  ASA: III  Anesthesia Plan: General   Post-op Pain Management:    Induction:   PONV Risk Score and Plan: Ondansetron  Airway Management Planned:   Additional Equipment:   Intra-op Plan:   Post-operative Plan:   Informed Consent: I have reviewed the patients History and Physical, chart, labs and discussed the procedure including the risks, benefits and alternatives for the proposed anesthesia with the patient or authorized representative who has indicated his/her understanding and acceptance.     Dental Advisory Given  Plan Discussed with: CRNA  Anesthesia Plan Comments:         Anesthesia Quick Evaluation

## 2021-03-16 NOTE — Interval H&P Note (Signed)
History and Physical Interval Note:  03/16/2021 9:12 AM  [name]  has presented today for surgery, with the diagnosis of bilateral renal calculus.  The various methods of treatment have been discussed with the patient and family. After consideration of risks, benefits and other options for treatment, the patient has consented to  Procedure(s): CYSTOSCOPY WITH RETROGRADE PYELOGRAM, URETEROSCOPY AND STENT PLACEMENT (Bilateral) HOLMIUM LASER APPLICATION (Bilateral) as a surgical intervention.  The patient's history has been reviewed, patient examined, no change in status, stable for surgery.  I have reviewed the patient's chart and labs.  Questions were answered to the patient's satisfaction.     Wilkie Aye

## 2021-03-17 ENCOUNTER — Encounter (HOSPITAL_COMMUNITY): Payer: Self-pay | Admitting: Urology

## 2021-03-21 LAB — CALCULI, WITH PHOTOGRAPH (CLINICAL LAB)
Calcium Oxalate Dihydrate: 30 %
Calcium Oxalate Monohydrate: 70 %
Weight Calculi: 20 mg

## 2021-03-29 ENCOUNTER — Other Ambulatory Visit: Payer: Self-pay

## 2021-03-29 ENCOUNTER — Encounter: Payer: Self-pay | Admitting: Urology

## 2021-03-29 ENCOUNTER — Ambulatory Visit (INDEPENDENT_AMBULATORY_CARE_PROVIDER_SITE_OTHER): Payer: Commercial Managed Care - PPO | Admitting: Urology

## 2021-03-29 VITALS — BP 153/85 | HR 96 | Temp 97.8°F

## 2021-03-29 DIAGNOSIS — N200 Calculus of kidney: Secondary | ICD-10-CM

## 2021-03-29 LAB — URINALYSIS, ROUTINE W REFLEX MICROSCOPIC
Bilirubin, UA: NEGATIVE
Ketones, UA: NEGATIVE
Leukocytes,UA: NEGATIVE
Nitrite, UA: NEGATIVE
Protein,UA: NEGATIVE
RBC, UA: NEGATIVE
Specific Gravity, UA: 1.025 (ref 1.005–1.030)
Urobilinogen, Ur: 0.2 mg/dL (ref 0.2–1.0)
pH, UA: 5.5 (ref 5.0–7.5)

## 2021-03-29 LAB — MICROSCOPIC EXAMINATION
Bacteria, UA: NONE SEEN
RBC, Urine: NONE SEEN /hpf (ref 0–2)

## 2021-03-29 MED ORDER — OXYCODONE-ACETAMINOPHEN 5-325 MG PO TABS: 1.0000 | ORAL_TABLET | ORAL | 0 refills | Status: DC | PRN

## 2021-03-29 NOTE — Progress Notes (Signed)
Urological Symptom Review  Patient is experiencing the following symptoms: Frequent urination Hard to postpone urination Get up at night to urinate Leakage of urine Stream starts and stops Trouble starting stream Have to strain to urinate Weak stream   Review of Systems  Gastrointestinal (upper)  : Negative for upper GI symptoms  Gastrointestinal (lower) : Negative for lower GI symptoms  Constitutional : Negative for symptoms  Skin: Negative for skin symptoms  Eyes: Negative for eye symptoms  Ear/Nose/Throat : Negative for Ear/Nose/Throat symptoms  Hematologic/Lymphatic: Negative for Hematologic/Lymphatic symptoms  Cardiovascular : Negative for cardiovascular symptoms  Respiratory : Negative for respiratory symptoms  Endocrine: Negative for endocrine symptoms  Musculoskeletal: Negative for musculoskeletal symptoms  Neurological: Negative for neurological symptoms  Psychologic: Negative for psychiatric symptoms 

## 2021-03-29 NOTE — Progress Notes (Signed)
03/29/2021 9:56 AM   [name] [birth year] [id number]  Referring provider: Benita Stabile, MD [address] [place],  [place] [address]  nephrolithiasis  HPI: Ms Warnick is a 54yo here for followup for nephrolithiasis. No flank pain currently. She has attempted to complete 2 24 hour urines but there were issues processing the samples. No worsening LUTS. She remains on UrocitK BID   PMH: Past Medical History:  Diagnosis Date  . Diabetes mellitus    insulin pump 2013  . Hypercholesteremia   . Hypertension   . Kidney stone   . Neuropathy   . Obesity   . Obstructive sleep apnea   . Palpitations   . Shortness of breath     Surgical History: Past Surgical History:  Procedure Laterality Date  . BIOPSY  06/23/2020   Procedure: BIOPSY;  Surgeon: Corbin Ade, MD;  [facility];  Service: Endoscopy;;  . CARDIAC CATHETERIZATION  12/11/2012   normal coronary arteries  . CESAREAN SECTION  1994;2000   [facility]  . CHOLECYSTECTOMY  1992  . COLONOSCOPY WITH PROPOFOL N/A 06/23/2020   large amount of stool in entire colon, prep inadequate.   . CYSTOSCOPY W/ URETERAL STENT PLACEMENT  05/08/2012   Procedure: CYSTOSCOPY WITH RETROGRADE PYELOGRAM/URETERAL STENT PLACEMENT;  Surgeon: Ky Barban, MD;  Location: [facility];  Service: Urology;  Laterality: Right;  . CYSTOSCOPY WITH RETROGRADE PYELOGRAM, URETEROSCOPY AND STENT PLACEMENT Right 05/04/2020   Procedure: CYSTOSCOPY WITH RIGHT RETROGRADE PYELOGRAM, RIGHT URETEROSCOPY AND RIGHT URETERAL STENT EXCHANGE;  Surgeon: Malen Gauze, MD;  Location: [facility];  Service: Urology;  Laterality: Right;  . CYSTOSCOPY WITH RETROGRADE PYELOGRAM, URETEROSCOPY AND STENT PLACEMENT Left 08/08/2020   Procedure: CYSTOSCOPY WITH RETROGRADE PYELOGRAM, URETEROSCOPY WITH LASER  AND STENT PLACEMENT;  Surgeon: Malen Gauze, MD;  Location: [facility];  Service: Urology;  Laterality: Left;  . CYSTOSCOPY WITH RETROGRADE PYELOGRAM,  URETEROSCOPY AND STENT PLACEMENT Right 11/08/2020   Procedure: CYSTOSCOPY WITH RIGHT RETROGRADE PYELOGRAM, RIGHT URETEROSCOPY AND STENT PLACEMENT;  Surgeon: Malen Gauze, MD;  Location: [facility];  Service: Urology;  Laterality: Right;  . CYSTOSCOPY WITH RETROGRADE PYELOGRAM, URETEROSCOPY AND STENT PLACEMENT Left 12/27/2020   Procedure: CYSTOSCOPY WITH LEFT RETROGRADE PYELOGRAM, LEFT URETEROSCOPY AND LEFT URETERAL STENT PLACEMENT;  Surgeon: Malen Gauze, MD;  Location: [facility];  Service: Urology;  Laterality: Left;  . CYSTOSCOPY WITH RETROGRADE PYELOGRAM, URETEROSCOPY AND STENT PLACEMENT Bilateral 03/16/2021   Procedure: CYSTOSCOPY WITH BILATERAL  RETROGRADE PYELOGRAM, BILATERAL URETEROSCOPY AND STENT PLACEMENT ON THE LEFT;  Surgeon: Malen Gauze, MD;  Location: [facility];  Service: Urology;  Laterality: Bilateral;  . CYSTOSCOPY WITH STENT PLACEMENT Right 02/25/2020   Procedure: CYSTOSCOPY WITH RIGHT RETROGRADE RIGHT STENT PLACEMENT;  Surgeon: Jerilee Field, MD;  Location: [facility];  Service: Urology;  Laterality: Right;  . ESOPHAGOGASTRODUODENOSCOPY (EGD) WITH PROPOFOL N/A 06/23/2020   Normal esophagus, multiple localized erosions were found in gastric antrum s/p biopsy, normal duodenum. Empiric dilation.Reactive gastropathy with erosions, negative H.pylori, no dysplasia.    Marland Kitchen EXTRACORPOREAL SHOCK WAVE LITHOTRIPSY Right 10/11/2020   Procedure: EXTRACORPOREAL SHOCK WAVE LITHOTRIPSY (ESWL);  Surgeon: Malen Gauze, MD;  Location: [facility];  Service: Urology;  Laterality: Right;  . FOOT SURGERY Right March, 2013   [facility]-removal of bone spur  . HOLMIUM LASER APPLICATION  05/04/2020   Procedure: HOLMIUM LASER LITHOTRIPSY RIGHT URETERAL CALCULUS;  Surgeon: Malen Gauze, MD;  Location: [facility];  Service: Urology;;  . HYSTEROSCOPY  WITH THERMACHOICE  04/25/2012   Procedure: HYSTEROSCOPY WITH THERMACHOICE;  Surgeon: Lazaro Arms, MD;  Location: [facility];  Service: Gynecology;   Laterality: N/A;  total therapy time= 11 minutes 42 seconds; 34 ml D5w  in and 34 ml D5w out; temp =87 degrees F  . LEFT HEART CATHETERIZATION WITH CORONARY ANGIOGRAM N/A 12/11/2012   Procedure: LEFT HEART CATHETERIZATION WITH CORONARY ANGIOGRAM;  Surgeon: Runell Gess, MD;  Location: [facility] CATH LAB;  Service: Cardiovascular;  Laterality: N/A;  . [name] DILATION N/A 06/23/2020   Procedure: [name] DILATION;  Surgeon: Corbin Ade, MD;  [facility];  Service: Endoscopy;  Laterality: N/A;  . Sinus sergery  1988   [place]  . STONE EXTRACTION WITH BASKET Right 11/08/2020   Procedure: STONE EXTRACTION WITH BASKET;  Surgeon: Malen Gauze, MD;  Location: [facility];  Service: Urology;  Laterality: Right;  . UMBILICAL HERNIA REPAIR N/A 03/25/2014   Procedure: UMBILICAL HERNIA REPAIR;  Surgeon: Marlane Hatcher, MD;  Location: [facility];  Service: General;  Laterality: N/A;  . uterine ablation      Home Medications:  Allergies as of 03/29/2021      Reactions   Ciprofloxacin Hives, Swelling   Penicillins Anaphylaxis, Rash   Codeine Other (See Comments)   had hallicunations   Morphine Nausea And Vomiting, Other (See Comments)   recieved in ED due to [facility] and had multple doses - gave visual hallucinations and vomitting.   Sulfonamide Derivatives Other (See Comments)   REACTION: Unsure - childhood allergy   Vancomycin Itching      Medication List       Accurate as of March 29, 2021  9:56 AM. If you have any questions, ask your nurse or doctor.        albuterol 108 (90 Base) MCG/ACT inhaler Commonly known as: VENTOLIN HFA Inhale 2 puffs into the lungs every 6 (six) hours as needed for wheezing or shortness of breath.   ALPRAZolam 1 MG tablet Commonly known as: XANAX Take 1 mg by mouth at bedtime.   ARIPiprazole 2 MG tablet Commonly known as: ABILIFY Take 2 mg by mouth at bedtime.   buPROPion 150 MG 24 hr tablet Commonly known as: WELLBUTRIN XL Take 150 mg by  mouth daily.   desvenlafaxine 50 MG 24 hr tablet Commonly known as: PRISTIQ Take 50 mg by mouth daily.   gabapentin 600 MG tablet Commonly known as: NEURONTIN Take 600 mg by mouth 3 (three) times daily.   HumaLOG 100 UNIT/ML injection Generic drug: insulin lispro Inject 3.5 Units into the skin continuous. 3.5 units every 1 hr-insulin pump   linaclotide 145 MCG Caps capsule Commonly known as: Linzess Take 1 capsule (145 mcg total) by mouth daily before breakfast.   lisinopril 10 MG tablet Commonly known as: ZESTRIL Take 10 mg by mouth daily.   omeprazole 40 MG capsule Commonly known as: PRILOSEC Take 40 mg by mouth daily.   ondansetron 4 MG tablet Commonly known as: ZOFRAN Take 1 tablet (4 mg total) by mouth every 8 (eight) hours as needed.   oxybutynin 5 MG tablet Commonly known as: DITROPAN Take 5 mg by mouth at bedtime.   oxyCODONE-acetaminophen 5-325 MG tablet Commonly known as: Percocet Take 1 tablet by mouth every 4 (four) hours as needed for moderate pain or severe pain.   pantoprazole 20 MG tablet Commonly known as: PROTONIX Take 20 mg by mouth 2 (two) times daily.   phentermine 37.5 MG tablet Commonly known  as: ADIPEX-P Take 37.5 mg by mouth daily before breakfast.   Potassium 99 MG Tabs Take 99 mg by mouth daily.   simvastatin 40 MG tablet Commonly known as: ZOCOR Take 40 mg by mouth daily.       Allergies:  Allergies  Allergen Reactions  . Ciprofloxacin Hives and Swelling  . Penicillins Anaphylaxis and Rash  . Codeine Other (See Comments)    had hallicunations  . Morphine Nausea And Vomiting and Other (See Comments)    recieved in ED due to [facility] and had multple doses - gave visual hallucinations and vomitting.  . Sulfonamide Derivatives Other (See Comments)    REACTION: Unsure - childhood allergy  . Vancomycin Itching    Family History: Family History  Problem Relation Age of Onset  . Diabetes Mother   . Depression Paternal  Grandmother   . Depression Paternal Grandfather   . Heart disease Other   . Cancer Other   . Diabetes Other   . Achalasia Other   . Anesthesia problems Neg Hx   . Hypotension Neg Hx   . Malignant hyperthermia Neg Hx   . Pseudochol deficiency Neg Hx     Social History:  reports that she has never smoked. She has never used smokeless tobacco. She reports that she does not drink alcohol and does not use drugs.  ROS: All other review of systems were reviewed and are negative except what is noted above in HPI  Physical Exam: BP (!) 153/85   Pulse 96   Temp 97.8 F (36.6 C)   LMP 07/16/2012   Constitutional:  Alert and oriented, No acute distress. HEENT: [place] AT, moist mucus membranes.  Trachea midline, no masses. Cardiovascular: No clubbing, cyanosis, or edema. Respiratory: Normal respiratory effort, no increased work of breathing. GI: Abdomen is soft, nontender, nondistended, no abdominal masses GU: No CVA tenderness.  Lymph: No cervical or inguinal lymphadenopathy. Skin: No rashes, bruises or suspicious lesions. Neurologic: Grossly intact, no focal deficits, moving all 4 extremities. Psychiatric: Normal mood and affect.  Laboratory Data: Lab Results  Component Value Date   WBC 11.4 (H) 05/02/2020   HGB 12.6 05/04/2020   HCT 37.0 05/04/2020   MCV 86.4 05/02/2020   PLT 384 05/02/2020    Lab Results  Component Value Date   CREATININE 0.65 02/15/2021    No results found for: PSA  No results found for: TESTOSTERONE  Lab Results  Component Value Date   HGBA1C 7.0 (H) 03/15/2021    Urinalysis    Component Value Date/Time   COLORURINE AMBER (A) 04/04/2020 0309   APPEARANCEUR Clear 02/15/2021 0902   LABSPEC 1.020 04/04/2020 0309   PHURINE 6.0 04/04/2020 0309   GLUCOSEU Negative 02/15/2021 0902   HGBUR LARGE (A) 04/04/2020 0309   HGBUR moderate 08/31/2008 0908   BILIRUBINUR Negative 02/15/2021 0902   KETONESUR NEGATIVE 04/04/2020 0309   PROTEINUR Negative  02/15/2021 0902   PROTEINUR 100 (A) 04/04/2020 0309   UROBILINOGEN 0.2 12/14/2020 0835   UROBILINOGEN 0.2 09/21/2014 1055   NITRITE Negative 02/15/2021 0902   NITRITE NEGATIVE 04/04/2020 0309   LEUKOCYTESUR Negative 02/15/2021 0902   LEUKOCYTESUR SMALL (A) 04/04/2020 0309    Lab Results  Component Value Date   LABMICR Comment 02/15/2021   WBCUA 6-10 (A) 02/01/2021   LABEPIT >10 (A) 02/01/2021   BACTERIA Few 02/01/2021    Pertinent Imaging:  Results for orders placed during the hospital encounter of 10/31/20  DG Abd 1 View  Narrative CLINICAL DATA:  Status  post lithotripsy  EXAM: ABDOMEN - 1 VIEW  COMPARISON:  10/10/2020  FINDINGS: Scattered large and small bowel gas is noted. Scattered calculi are noted over the lower pole of the right kidney. The largest of these measures approximately 6 mm. No definitive ureteral stones are noted. Degenerative changes of lumbar spine are seen.  IMPRESSION: Persistent right lower pole renal stones. The largest of these measures approximately 6 mm.   Electronically Signed By: Alcide Clever M.D. On: 10/31/2020 11:38  No results found for this or any previous visit.  No results found for this or any previous visit.  No results found for this or any previous visit.  Results for orders placed in visit on 01/13/21  Ultrasound renal complete  Narrative CLINICAL DATA:  Left flank pain  EXAM: RENAL / URINARY TRACT ULTRASOUND COMPLETE  COMPARISON:  CT 10/31/2020  FINDINGS: Right Kidney:  Renal measurements: 13.3 x 4.7 x 6.5 cm = volume: 210 mL. Renal cortical echogenicity is normal and cortical thickness has been preserved. An 8 mm nonobstructing calculus is seen within the lower pole the right kidney. Possible 6 mm nonobstructing calculus within the upper pole of the right kidney. No hydronephrosis. No intrarenal masses.  Left Kidney:  Renal measurements: 12.4 x 5.4 x 5.2 cm = volume: 185 mL. Renal cortical  echogenicity is normal and cortical thickness has been preserved. A 5 mm nonobstructing calculus is noted within the lower pole of the left kidney. No hydronephrosis. No intrarenal masses are seen.  Bladder:  Appears normal for degree of bladder distention.  Other:  None.  IMPRESSION: Bilateral nonobstructing nephrolithiasis, similar to that noted on prior CT examination.   Electronically Signed By: Helyn Numbers MD On: 01/13/2021 13:47  No results found for this or any previous visit.  No results found for this or any previous visit.  Results for orders placed during the hospital encounter of 02/01/21  CT RENAL STONE STUDY  Narrative CLINICAL DATA:  Left upper quadrant and left flank pain with hematuria for 3 weeks.  EXAM: CT ABDOMEN AND PELVIS WITHOUT CONTRAST  TECHNIQUE: Multidetector CT imaging of the abdomen and pelvis was performed following the standard protocol without IV contrast.  COMPARISON:  10/31/2020 CT abdomen/pelvis.  FINDINGS: Lower chest: No significant pulmonary nodules or acute consolidative airspace disease.  Hepatobiliary: Normal liver size. No liver mass. Cholecystectomy. No biliary ductal dilatation.  Pancreas: Normal, with no mass or duct dilation.  Spleen: Normal size. No mass.  Adrenals/Urinary Tract: Left adrenal 1.1 cm nodule with density 17 HU, stable since 04/04/2020 CT, compatible with an adenoma. Normal right adrenal. Nonobstructing 6 mm and 1 mm lower right renal stones. Multiple (at least 6) nonobstructing left renal stones, largest 4 mm in the interpolar left kidney. Mild fullness of the right renal collecting system without overt hydronephrosis, unchanged from prior CT. No left hydronephrosis. No contour deforming renal masses. Normal caliber ureters. No ureteral stones. Normal bladder.  Stomach/Bowel: Normal non-distended stomach. Normal caliber small bowel with no small bowel wall thickening. Normal appendix.  Normal large bowel with no diverticulosis, large bowel wall thickening or pericolonic fat stranding.  Vascular/Lymphatic: Mildly atherosclerotic nonaneurysmal abdominal aorta. No pathologically enlarged lymph nodes in the abdomen or pelvis.  Reproductive: Grossly normal uterus.  No adnexal mass.  Other: No pneumoperitoneum, ascites or focal fluid collection.  Musculoskeletal: No aggressive appearing focal osseous lesions. Chronic bilateral L5 pars defects. Moderate thoracolumbar spondylosis.  IMPRESSION: 1. Nonobstructing bilateral nephrolithiasis. No overt hydronephrosis. No ureteral or bladder stones.  2. Stable left adrenal adenoma. 3. Chronic bilateral L5 pars defects. 4. Aortic Atherosclerosis (ICD10-I70.0).   Electronically Signed By: Delbert Phenix M.D. On: 02/01/2021 13:06   Assessment & Plan:    1. Kidney stones -RCT 1 month with renal US - Urinalysis, Routine w reflex microscopic - Ultrasound renal complete; Future   Return in about 4 weeks (around 04/26/2021) for renal US.  Wilkie Aye, MD  [facility] [place]

## 2021-03-29 NOTE — Patient Instructions (Signed)

## 2021-04-18 ENCOUNTER — Ambulatory Visit (HOSPITAL_COMMUNITY): Payer: Commercial Managed Care - PPO

## 2021-04-18 ENCOUNTER — Encounter (HOSPITAL_COMMUNITY): Payer: Commercial Managed Care - PPO

## 2021-04-18 ENCOUNTER — Ambulatory Visit (HOSPITAL_COMMUNITY): Payer: Self-pay

## 2021-05-02 ENCOUNTER — Ambulatory Visit: Payer: Commercial Managed Care - PPO | Admitting: Gastroenterology

## 2021-05-03 ENCOUNTER — Ambulatory Visit (HOSPITAL_COMMUNITY)
Admission: RE | Admit: 2021-05-03 | Discharge: 2021-05-03 | Disposition: A | Discharge status: Home / Self Care | Payer: No Typology Code available for payment source | Source: Ambulatory Visit | Attending: Urology | Admitting: Urology

## 2021-05-03 DIAGNOSIS — N200 Calculus of kidney: Secondary | ICD-10-CM | POA: Diagnosis present

## 2021-05-09 ENCOUNTER — Ambulatory Visit (HOSPITAL_COMMUNITY)
Admission: RE | Admit: 2021-05-09 | Discharge: 2021-05-09 | Disposition: A | Discharge status: Home / Self Care | Payer: No Typology Code available for payment source | Source: Ambulatory Visit | Attending: Internal Medicine | Admitting: Internal Medicine

## 2021-05-09 ENCOUNTER — Other Ambulatory Visit: Payer: Self-pay

## 2021-05-09 ENCOUNTER — Encounter (HOSPITAL_COMMUNITY): Payer: Self-pay

## 2021-05-09 ENCOUNTER — Other Ambulatory Visit (HOSPITAL_COMMUNITY): Payer: Self-pay | Admitting: Internal Medicine

## 2021-05-09 DIAGNOSIS — R922 Inconclusive mammogram: Secondary | ICD-10-CM | POA: Diagnosis present

## 2021-05-09 DIAGNOSIS — N644 Mastodynia: Secondary | ICD-10-CM | POA: Diagnosis not present

## 2021-05-10 ENCOUNTER — Encounter: Payer: Self-pay | Admitting: Urology

## 2021-05-10 ENCOUNTER — Ambulatory Visit: Payer: Commercial Managed Care - PPO | Admitting: Urology

## 2021-05-10 VITALS — BP 134/75 | HR 76

## 2021-05-10 DIAGNOSIS — N200 Calculus of kidney: Secondary | ICD-10-CM

## 2021-05-10 LAB — URINALYSIS, ROUTINE W REFLEX MICROSCOPIC
Bilirubin, UA: NEGATIVE
Glucose, UA: NEGATIVE
Ketones, UA: NEGATIVE
Leukocytes,UA: NEGATIVE
Nitrite, UA: NEGATIVE
Protein,UA: NEGATIVE
RBC, UA: NEGATIVE
Specific Gravity, UA: >1.03 — ABNORMAL HIGH (ref 1.005–1.030)
Urobilinogen, Ur: 0.2 mg/dL (ref 0.2–1.0)
pH, UA: 5.5 (ref 5.0–7.5)

## 2021-05-10 MED ORDER — POTASSIUM CITRATE ER 15 MEQ (1620 MG) PO TBCR
1.0000 | EXTENDED_RELEASE_TABLET | Freq: Two times a day (BID) | ORAL | 0 refills | Status: DC
  Filled 2021-06-29: qty 60, 30d supply, fill #0
  Filled 2021-07-13: qty 180, 90d supply, fill #0

## 2021-05-10 NOTE — Patient Instructions (Signed)

## 2021-05-10 NOTE — Progress Notes (Signed)
Urological Symptom Review  Patient is experiencing the following symptoms: Frequent urination Hard to postpone urination Get up at night to urinate Leakage of urine Stream starts and stops Trouble starting stream Have to strain to urinate   Review of Systems  Gastrointestinal (upper)  : Negative for upper GI symptoms  Gastrointestinal (lower) : Negative for lower GI symptoms  Constitutional : Fatigue  Skin: Negative for skin symptoms  Eyes: Negative for eye symptoms  Ear/Nose/Throat : Negative for Ear/Nose/Throat symptoms  Hematologic/Lymphatic: Negative for Hematologic/Lymphatic symptoms  Cardiovascular : Negative for cardiovascular symptoms  Respiratory : Negative for respiratory symptoms  Endocrine: Negative for endocrine symptoms  Musculoskeletal: Negative for musculoskeletal symptoms  Neurological: Negative for neurological symptoms  Psychologic: Depression Anxiety

## 2021-05-10 NOTE — Progress Notes (Signed)
05/10/2021 8:29 AM   [name] 1966/07/21 [id number]  Referring provider: Benita Stabile, MD [address] [place],  [place] [address]  nephrolithiasis  HPI: [name] is a 54yo here for followup for nephrolithiasis. Renal US shows non-shadowing 9mm calcification but ureteroscopy failed to identify a calculus. Her 24 hour urine was rejected for improper preservative addition. She increased water intake to 50-60 oz. She is on UrocitK BID. No flank pain.   PMH: Past Medical History:  Diagnosis Date  . Diabetes mellitus    insulin pump 2013  . Hypercholesteremia   . Hypertension   . Kidney stone   . Neuropathy   . Obesity   . Obstructive sleep apnea   . Palpitations   . Shortness of breath     Surgical History: Past Surgical History:  Procedure Laterality Date  . BIOPSY  06/23/2020   Procedure: BIOPSY;  Surgeon: Corbin Ade, MD;  [facility];  Service: Endoscopy;;  . CARDIAC CATHETERIZATION  12/11/2012   normal coronary arteries  . CESAREAN SECTION  1994;2000   [facility]  . CHOLECYSTECTOMY  1992  . COLONOSCOPY WITH PROPOFOL N/A 06/23/2020   large amount of stool in entire colon, prep inadequate.   . CYSTOSCOPY W/ URETERAL STENT PLACEMENT  05/08/2012   Procedure: CYSTOSCOPY WITH RETROGRADE PYELOGRAM/URETERAL STENT PLACEMENT;  Surgeon: Ky Barban, MD;  Location: [facility];  Service: Urology;  Laterality: Right;  . CYSTOSCOPY WITH RETROGRADE PYELOGRAM, URETEROSCOPY AND STENT PLACEMENT Right 05/04/2020   Procedure: CYSTOSCOPY WITH RIGHT RETROGRADE PYELOGRAM, RIGHT URETEROSCOPY AND RIGHT URETERAL STENT EXCHANGE;  Surgeon: Malen Gauze, MD;  Location: [facility];  Service: Urology;  Laterality: Right;  . CYSTOSCOPY WITH RETROGRADE PYELOGRAM, URETEROSCOPY AND STENT PLACEMENT Left 08/08/2020   Procedure: CYSTOSCOPY WITH RETROGRADE PYELOGRAM, URETEROSCOPY WITH LASER  AND STENT PLACEMENT;  Surgeon: Malen Gauze, MD;  Location: [facility];  Service:  Urology;  Laterality: Left;  . CYSTOSCOPY WITH RETROGRADE PYELOGRAM, URETEROSCOPY AND STENT PLACEMENT Right 11/08/2020   Procedure: CYSTOSCOPY WITH RIGHT RETROGRADE PYELOGRAM, RIGHT URETEROSCOPY AND STENT PLACEMENT;  Surgeon: Malen Gauze, MD;  Location: [facility];  Service: Urology;  Laterality: Right;  . CYSTOSCOPY WITH RETROGRADE PYELOGRAM, URETEROSCOPY AND STENT PLACEMENT Left 12/27/2020   Procedure: CYSTOSCOPY WITH LEFT RETROGRADE PYELOGRAM, LEFT URETEROSCOPY AND LEFT URETERAL STENT PLACEMENT;  Surgeon: Malen Gauze, MD;  Location: [facility];  Service: Urology;  Laterality: Left;  . CYSTOSCOPY WITH RETROGRADE PYELOGRAM, URETEROSCOPY AND STENT PLACEMENT Bilateral 03/16/2021   Procedure: CYSTOSCOPY WITH BILATERAL  RETROGRADE PYELOGRAM, BILATERAL URETEROSCOPY AND STENT PLACEMENT ON THE LEFT;  Surgeon: Malen Gauze, MD;  Location: [facility];  Service: Urology;  Laterality: Bilateral;  . CYSTOSCOPY WITH STENT PLACEMENT Right 02/25/2020   Procedure: CYSTOSCOPY WITH RIGHT RETROGRADE RIGHT STENT PLACEMENT;  Surgeon: Jerilee Field, MD;  Location: [facility];  Service: Urology;  Laterality: Right;  . ESOPHAGOGASTRODUODENOSCOPY (EGD) WITH PROPOFOL N/A 06/23/2020   Normal esophagus, multiple localized erosions were found in gastric antrum s/p biopsy, normal duodenum. Empiric dilation.Reactive gastropathy with erosions, negative H.pylori, no dysplasia.    Marland Kitchen EXTRACORPOREAL SHOCK WAVE LITHOTRIPSY Right 10/11/2020   Procedure: EXTRACORPOREAL SHOCK WAVE LITHOTRIPSY (ESWL);  Surgeon: Malen Gauze, MD;  Location: [facility];  Service: Urology;  Laterality: Right;  . FOOT SURGERY Right March, 2013   [facility]-removal of bone spur  . HOLMIUM LASER APPLICATION  05/04/2020   Procedure: HOLMIUM LASER LITHOTRIPSY RIGHT URETERAL CALCULUS;  Surgeon: Malen Gauze, MD;  Location: [facility];  Service: Urology;;  . HYSTEROSCOPY WITH THERMACHOICE  04/25/2012   Procedure: HYSTEROSCOPY WITH THERMACHOICE;   Surgeon: Lazaro Arms, MD;  Location: [facility];  Service: Gynecology;  Laterality: N/A;  total therapy time= 11 minutes 42 seconds; 34 ml D5w  in and 34 ml D5w out; temp =87 degrees F  . LEFT HEART CATHETERIZATION WITH CORONARY ANGIOGRAM N/A 12/11/2012   Procedure: LEFT HEART CATHETERIZATION WITH CORONARY ANGIOGRAM;  Surgeon: Runell Gess, MD;  Location: [facility] CATH LAB;  Service: Cardiovascular;  Laterality: N/A;  . [name] DILATION N/A 06/23/2020   Procedure: [name] DILATION;  Surgeon: Corbin Ade, MD;  [facility];  Service: Endoscopy;  Laterality: N/A;  . Sinus sergery  1988   [place]  . STONE EXTRACTION WITH BASKET Right 11/08/2020   Procedure: STONE EXTRACTION WITH BASKET;  Surgeon: Malen Gauze, MD;  Location: [facility];  Service: Urology;  Laterality: Right;  . UMBILICAL HERNIA REPAIR N/A 03/25/2014   Procedure: UMBILICAL HERNIA REPAIR;  Surgeon: Marlane Hatcher, MD;  Location: [facility];  Service: General;  Laterality: N/A;  . uterine ablation      Home Medications:  Allergies as of 05/10/2021      Reactions   Ciprofloxacin Hives, Swelling   Penicillins Anaphylaxis, Rash   Codeine Other (See Comments)   had hallicunations   Morphine Nausea And Vomiting, Other (See Comments)   recieved in ED due to [facility] and had multple doses - gave visual hallucinations and vomitting.   Sulfonamide Derivatives Other (See Comments)   REACTION: Unsure - childhood allergy   Vancomycin Itching      Medication List       Accurate as of May 10, 2021  8:29 AM. If you have any questions, ask your nurse or doctor.        STOP taking these medications   lisinopril-hydrochlorothiazide 20-12.5 MG tablet Commonly known as: ZESTORETIC     TAKE these medications   albuterol 108 (90 Base) MCG/ACT inhaler Commonly known as: VENTOLIN HFA Inhale 2 puffs into the lungs every 6 (six) hours as needed for wheezing or shortness of breath.   ALPRAZolam 1 MG tablet Commonly known  as: XANAX Take 1 mg by mouth at bedtime.   ARIPiprazole 2 MG tablet Commonly known as: ABILIFY Take 2 mg by mouth at bedtime.   buPROPion 150 MG 24 hr tablet Commonly known as: WELLBUTRIN XL Take 150 mg by mouth daily.   desvenlafaxine 50 MG 24 hr tablet Commonly known as: PRISTIQ Take 50 mg by mouth daily.   gabapentin 600 MG tablet Commonly known as: NEURONTIN Take 600 mg by mouth 3 (three) times daily.   HumaLOG 100 UNIT/ML injection Generic drug: insulin lispro Inject 3.5 Units into the skin continuous. 3.5 units every 1 hr-insulin pump   linaclotide 145 MCG Caps capsule Commonly known as: Linzess Take 1 capsule (145 mcg total) by mouth daily before breakfast.   lisinopril 10 MG tablet Commonly known as: ZESTRIL Take 10 mg by mouth daily.   omeprazole 40 MG capsule Commonly known as: PRILOSEC Take 40 mg by mouth daily.   ondansetron 4 MG tablet Commonly known as: ZOFRAN Take 1 tablet (4 mg total) by mouth every 8 (eight) hours as needed.   oxybutynin 5 MG tablet Commonly known as: DITROPAN Take 5 mg by mouth at bedtime.   oxyCODONE-acetaminophen 5-325 MG tablet Commonly known as: Percocet Take 1 tablet by mouth every 4 (four) hours as needed for  moderate pain or severe pain.   pantoprazole 20 MG tablet Commonly known as: PROTONIX Take 20 mg by mouth 2 (two) times daily.   phentermine 37.5 MG tablet Commonly known as: ADIPEX-P Take 37.5 mg by mouth daily before breakfast.   Potassium 99 MG Tabs Take 99 mg by mouth daily.   simvastatin 40 MG tablet Commonly known as: ZOCOR Take 40 mg by mouth daily.       Allergies:  Allergies  Allergen Reactions  . Ciprofloxacin Hives and Swelling  . Penicillins Anaphylaxis and Rash  . Codeine Other (See Comments)    had hallicunations  . Morphine Nausea And Vomiting and Other (See Comments)    recieved in ED due to [facility] and had multple doses - gave visual hallucinations and vomitting.  .  Sulfonamide Derivatives Other (See Comments)    REACTION: Unsure - childhood allergy  . Vancomycin Itching    Family History: Family History  Problem Relation Age of Onset  . Diabetes Mother   . Depression Paternal Grandmother   . Depression Paternal Grandfather   . Heart disease Other   . Cancer Other   . Diabetes Other   . Achalasia Other   . Anesthesia problems Neg Hx   . Hypotension Neg Hx   . Malignant hyperthermia Neg Hx   . Pseudochol deficiency Neg Hx     Social History:  reports that she has never smoked. She has never used smokeless tobacco. She reports that she does not drink alcohol and does not use drugs.  ROS: All other review of systems were reviewed and are negative except what is noted above in HPI  Physical Exam: BP 134/75   Pulse 76   LMP 07/16/2012   Constitutional:  Alert and oriented, No acute distress. HEENT: [place] AT, moist mucus membranes.  Trachea midline, no masses. Cardiovascular: No clubbing, cyanosis, or edema. Respiratory: Normal respiratory effort, no increased work of breathing. GI: Abdomen is soft, nontender, nondistended, no abdominal masses GU: No CVA tenderness.  Lymph: No cervical or inguinal lymphadenopathy. Skin: No rashes, bruises or suspicious lesions. Neurologic: Grossly intact, no focal deficits, moving all 4 extremities. Psychiatric: Normal mood and affect.  Laboratory Data: Lab Results  Component Value Date   WBC 11.4 (H) 05/02/2020   HGB 12.6 05/04/2020   HCT 37.0 05/04/2020   MCV 86.4 05/02/2020   PLT 384 05/02/2020    Lab Results  Component Value Date   CREATININE 0.65 02/15/2021    No results found for: PSA  No results found for: TESTOSTERONE  Lab Results  Component Value Date   HGBA1C 7.0 (H) 03/15/2021    Urinalysis    Component Value Date/Time   COLORURINE AMBER (A) 04/04/2020 0309   APPEARANCEUR Clear 03/29/2021 0902   LABSPEC 1.020 04/04/2020 0309   PHURINE 6.0 04/04/2020 0309   GLUCOSEU 1+ (A)  03/29/2021 0902   HGBUR LARGE (A) 04/04/2020 0309   HGBUR moderate 08/31/2008 0908   BILIRUBINUR Negative 03/29/2021 0902   KETONESUR NEGATIVE 04/04/2020 0309   PROTEINUR Negative 03/29/2021 0902   PROTEINUR 100 (A) 04/04/2020 0309   UROBILINOGEN 0.2 12/14/2020 0835   UROBILINOGEN 0.2 09/21/2014 1055   NITRITE Negative 03/29/2021 0902   NITRITE NEGATIVE 04/04/2020 0309   LEUKOCYTESUR Negative 03/29/2021 0902   LEUKOCYTESUR SMALL (A) 04/04/2020 0309    Lab Results  Component Value Date   LABMICR See below: 03/29/2021   WBCUA 0-5 03/29/2021   LABEPIT 0-10 03/29/2021   BACTERIA None seen 03/29/2021  Pertinent Imaging: Renal US 05/03/2021: Images reviewed and discussed with the patient Results for orders placed during the hospital encounter of 10/31/20  DG Abd 1 View  Narrative CLINICAL DATA:  Status post lithotripsy  EXAM: ABDOMEN - 1 VIEW  COMPARISON:  10/10/2020  FINDINGS: Scattered large and small bowel gas is noted. Scattered calculi are noted over the lower pole of the right kidney. The largest of these measures approximately 6 mm. No definitive ureteral stones are noted. Degenerative changes of lumbar spine are seen.  IMPRESSION: Persistent right lower pole renal stones. The largest of these measures approximately 6 mm.   Electronically Signed By: Alcide Clever M.D. On: 10/31/2020 11:38  No results found for this or any previous visit.  No results found for this or any previous visit.  No results found for this or any previous visit.  Results for orders placed during the hospital encounter of 05/03/21  Ultrasound renal complete  Narrative CLINICAL DATA:  Nephrolithiasis  EXAM: RENAL / URINARY TRACT ULTRASOUND COMPLETE  COMPARISON:  Renal stone protocol CT 02/01/2021  FINDINGS: Right Kidney:  Renal measurements: 10.4 x 5.4 x 5.6 cm = volume: 163 mL. Echogenicity within normal limits. No mass or hydronephrosis visualized.  Left  Kidney:  Renal measurements: 11.9 x 6.3 x 5.0 cm = volume: 195 mL. Echogenicity within normal limits. No mass or hydronephrosis visualized. 9 mm echogenic focus in the lower pole of the left kidney is likely a nonobstructing calculus.  Bladder:  Appears normal for degree of bladder distention.  Other:  Diffuse increased echogenicity of the visualized portions of the hepatic parenchyma are a nonspecific indicator of hepatocellular dysfunction, most commonly steatosis.  IMPRESSION: 9 mm non-obstructing calculus.  No hydronephrosis.   Electronically Signed By: Acquanetta Belling M.D. On: 05/03/2021 16:16  No results found for this or any previous visit.  No results found for this or any previous visit.  Results for orders placed during the hospital encounter of 02/01/21  CT RENAL STONE STUDY  Narrative CLINICAL DATA:  Left upper quadrant and left flank pain with hematuria for 3 weeks.  EXAM: CT ABDOMEN AND PELVIS WITHOUT CONTRAST  TECHNIQUE: Multidetector CT imaging of the abdomen and pelvis was performed following the standard protocol without IV contrast.  COMPARISON:  10/31/2020 CT abdomen/pelvis.  FINDINGS: Lower chest: No significant pulmonary nodules or acute consolidative airspace disease.  Hepatobiliary: Normal liver size. No liver mass. Cholecystectomy. No biliary ductal dilatation.  Pancreas: Normal, with no mass or duct dilation.  Spleen: Normal size. No mass.  Adrenals/Urinary Tract: Left adrenal 1.1 cm nodule with density 17 HU, stable since 04/04/2020 CT, compatible with an adenoma. Normal right adrenal. Nonobstructing 6 mm and 1 mm lower right renal stones. Multiple (at least 6) nonobstructing left renal stones, largest 4 mm in the interpolar left kidney. Mild fullness of the right renal collecting system without overt hydronephrosis, unchanged from prior CT. No left hydronephrosis. No contour deforming renal masses. Normal caliber ureters. No  ureteral stones. Normal bladder.  Stomach/Bowel: Normal non-distended stomach. Normal caliber small bowel with no small bowel wall thickening. Normal appendix. Normal large bowel with no diverticulosis, large bowel wall thickening or pericolonic fat stranding.  Vascular/Lymphatic: Mildly atherosclerotic nonaneurysmal abdominal aorta. No pathologically enlarged lymph nodes in the abdomen or pelvis.  Reproductive: Grossly normal uterus.  No adnexal mass.  Other: No pneumoperitoneum, ascites or focal fluid collection.  Musculoskeletal: No aggressive appearing focal osseous lesions. Chronic bilateral L5 pars defects. Moderate thoracolumbar spondylosis.  IMPRESSION: 1.  Nonobstructing bilateral nephrolithiasis. No overt hydronephrosis. No ureteral or bladder stones. 2. Stable left adrenal adenoma. 3. Chronic bilateral L5 pars defects. 4. Aortic Atherosclerosis (ICD10-I70.0).   Electronically Signed By: Delbert Phenix M.D. On: 02/01/2021 13:06   Assessment & Plan:    1. Kidney stones -Continue urocitK BID -RTC 3 months with renal US - Urinalysis, Routine w reflex microscopic   No follow-ups on file.  Wilkie Aye, MD  [facility] [place]

## 2021-06-21 ENCOUNTER — Other Ambulatory Visit: Payer: Self-pay

## 2021-06-21 ENCOUNTER — Other Ambulatory Visit (HOSPITAL_COMMUNITY): Payer: Self-pay | Admitting: Student

## 2021-06-21 ENCOUNTER — Ambulatory Visit (HOSPITAL_COMMUNITY)
Admission: RE | Admit: 2021-06-21 | Discharge: 2021-06-21 | Disposition: A | Discharge status: Home / Self Care | Payer: No Typology Code available for payment source | Source: Ambulatory Visit | Attending: Student | Admitting: Student

## 2021-06-21 DIAGNOSIS — R051 Acute cough: Secondary | ICD-10-CM | POA: Insufficient documentation

## 2021-06-23 ENCOUNTER — Encounter (HOSPITAL_COMMUNITY): Payer: Self-pay | Admitting: Emergency Medicine

## 2021-06-23 ENCOUNTER — Emergency Department (HOSPITAL_COMMUNITY): Payer: No Typology Code available for payment source

## 2021-06-23 ENCOUNTER — Emergency Department (HOSPITAL_COMMUNITY)
Admission: EM | Admit: 2021-06-23 | Discharge: 2021-06-24 | Disposition: A | Discharge status: Home / Self Care | Payer: No Typology Code available for payment source | Attending: Emergency Medicine | Admitting: Emergency Medicine

## 2021-06-23 ENCOUNTER — Other Ambulatory Visit: Payer: Self-pay

## 2021-06-23 DIAGNOSIS — R0789 Other chest pain: Secondary | ICD-10-CM | POA: Insufficient documentation

## 2021-06-23 DIAGNOSIS — Z794 Long term (current) use of insulin: Secondary | ICD-10-CM | POA: Insufficient documentation

## 2021-06-23 DIAGNOSIS — R0602 Shortness of breath: Secondary | ICD-10-CM | POA: Insufficient documentation

## 2021-06-23 DIAGNOSIS — R079 Chest pain, unspecified: Secondary | ICD-10-CM | POA: Diagnosis present

## 2021-06-23 DIAGNOSIS — U099 Post covid-19 condition, unspecified: Secondary | ICD-10-CM | POA: Diagnosis not present

## 2021-06-23 DIAGNOSIS — Z79899 Other long term (current) drug therapy: Secondary | ICD-10-CM | POA: Insufficient documentation

## 2021-06-23 DIAGNOSIS — K219 Gastro-esophageal reflux disease without esophagitis: Secondary | ICD-10-CM | POA: Diagnosis not present

## 2021-06-23 DIAGNOSIS — E1140 Type 2 diabetes mellitus with diabetic neuropathy, unspecified: Secondary | ICD-10-CM | POA: Diagnosis not present

## 2021-06-23 DIAGNOSIS — I10 Essential (primary) hypertension: Secondary | ICD-10-CM | POA: Insufficient documentation

## 2021-06-23 LAB — CBC
HCT: 38.4 % (ref 36.0–46.0)
Hemoglobin: 12.8 g/dL (ref 12.0–15.0)
MCH: 28.3 pg (ref 26.0–34.0)
MCHC: 33.3 g/dL (ref 30.0–36.0)
MCV: 85 fL (ref 80.0–100.0)
Platelets: 278 10*3/uL (ref 150–400)
RBC: 4.52 MIL/uL (ref 3.87–5.11)
RDW: 14.1 % (ref 11.5–15.5)
WBC: 7.5 10*3/uL (ref 4.0–10.5)
nRBC: 0 % (ref 0.0–0.2)

## 2021-06-23 NOTE — ED Triage Notes (Signed)
Dx with covid on June 22.  Pt reports she still has bad cough, chest pain in center of chest x 3 days  pain to LT lower back and shortness of breath.

## 2021-06-24 ENCOUNTER — Emergency Department (HOSPITAL_COMMUNITY): Payer: No Typology Code available for payment source

## 2021-06-24 ENCOUNTER — Other Ambulatory Visit (HOSPITAL_COMMUNITY): Payer: Self-pay

## 2021-06-24 LAB — BASIC METABOLIC PANEL
Anion gap: 8 (ref 5–15)
BUN: 12 mg/dL (ref 6–20)
CO2: 26 mmol/L (ref 22–32)
Calcium: 8.9 mg/dL (ref 8.9–10.3)
Chloride: 101 mmol/L (ref 98–111)
Creatinine, Ser: 0.66 mg/dL (ref 0.44–1.00)
GFR, Estimated: >60 mL/min (ref >60)
Glucose, Bld: 224 mg/dL — ABNORMAL HIGH (ref 70–99)
Potassium: 3.9 mmol/L (ref 3.5–5.1)
Sodium: 135 mmol/L (ref 135–145)

## 2021-06-24 LAB — TROPONIN I (HIGH SENSITIVITY)
Troponin I (High Sensitivity): <2 ng/L (ref <18)
Troponin I (High Sensitivity): <2 ng/L (ref <18)

## 2021-06-24 LAB — D-DIMER, QUANTITATIVE: D-Dimer, Quant: <0.27 ug/mL-FEU (ref 0.00–0.50)

## 2021-06-24 MED ORDER — IOHEXOL 350 MG/ML SOLN
64.0000 mL | Freq: Once | INTRAVENOUS | Status: AC | PRN
  Administered 2021-06-24: 64 mL via INTRAVENOUS

## 2021-06-24 MED ORDER — KETOROLAC TROMETHAMINE 30 MG/ML IJ SOLN
30.0000 mg | Freq: Once | INTRAMUSCULAR | Status: AC
  Administered 2021-06-24: 30 mg via INTRAVENOUS
  Filled 2021-06-24: qty 1

## 2021-06-24 MED ORDER — BENZONATATE 100 MG PO CAPS: 100.0000 mg | ORAL_CAPSULE | Freq: Three times a day (TID) | ORAL | 0 refills | Status: DC | PRN

## 2021-06-24 MED ORDER — ALBUTEROL SULFATE HFA 108 (90 BASE) MCG/ACT IN AERS
2.0000 | INHALATION_SPRAY | Freq: Once | RESPIRATORY_TRACT | Status: AC
  Administered 2021-06-24: 2 via RESPIRATORY_TRACT
  Filled 2021-06-24: qty 6.7

## 2021-06-24 NOTE — ED Notes (Signed)
Patient ambulated to restroom at this time. Patient 02 sat 98 percent to and from restroom. Upon returning to room patient states that she felt short of breath. Patient respirations @ 22. Patient coughing some.

## 2021-06-24 NOTE — Discharge Instructions (Addendum)
There is no evidence of heart attack or blood clot in the lung.  Your shortness of breath may persist for several weeks after your COVID.  Follow-up with your doctor.  Return to the ED with difficulty breathing, chest pain with exertion, productive cough, fever or any other concerns.

## 2021-06-24 NOTE — ED Provider Notes (Signed)
[facility] EMERGENCY DEPARTMENT Provider Note   CSN: [id number] Arrival date & time: 06/23/21  2202     History No chief complaint on file.   [name] is a 55 y.o. female.  Patient diabetic with insulin pump, hypertension, high cholesterol, sleep apnea here with chest pain, cough, shortness of breath since she was diagnosed with COVID in June 22.  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  Has never really improved despite taking steroids at home.  Has had increasing shortness of breath with a deep nonproductive cough.  Chest pain with coughing has been constant over the past week.  Pain is in the center of her chest and is worse when she tries to cough.  States she has difficulty with any difficulty sleeping due to coughing and pain.  She has been taking Tessalon Perles without relief.  Her PCP also gave her Trelegy inhaler which is not helping.  Denies any history of COPD or asthma.  Denies any cardiac history. No leg pain or leg swelling.  No abdominal pain, nausea or vomiting.  Did have several episodes of diarrhea yesterday.  The history is provided by the patient.      Past Medical History:  Diagnosis Date   Diabetes mellitus    insulin pump 2013   Hypercholesteremia    Hypertension    Kidney stone    Neuropathy    Obesity    Obstructive sleep apnea    Palpitations    Shortness of breath     Patient Active Problem List   Diagnosis Date Noted   Dyspepsia 01/18/2021   Constipation 01/18/2021   Dorsalgia 10/31/2020   Gross hematuria 04/04/2020   GERD (gastroesophageal reflux disease) 03/09/2020   Dysphagia 03/09/2020   Hypokalemia 02/26/2020   Ureterolithiasis 02/26/2020   Hyperglycemia 02/25/2020   Preventative health care 07/15/2019   Other intervertebral disc degeneration, lumbar region 05/24/2019   Palpitations 12/08/2014   Chest pain 12/08/2014   Obstructive sleep apnea 12/08/2014   Incarcerated umbilical hernia 03/25/2014    Cellulitis 02/19/2014   Pelvic pain 07/15/2013   Depression 07/15/2013   Decreased libido 12/23/2012   Dyspnea 11/12/2012   Headache(784.0) 10/14/2012   Neuropathy 10/14/2012   Candidal intertrigo 09/12/2012   Diabetes mellitus type II, uncontrolled (HCC) 05/10/2012   Hypertension 05/10/2012   HYPERLIPIDEMIA 09/29/2008   OBESITY 08/31/2008   Kidney stones 08/31/2008    Past Surgical History:  Procedure Laterality Date   BIOPSY  06/23/2020   Procedure: BIOPSY;  Surgeon: Corbin Ade, MD;  Location: [facility];  Service: Endoscopy;;   CARDIAC CATHETERIZATION  12/11/2012   normal coronary arteries   CESAREAN SECTION  1994;2000   [place]   CHOLECYSTECTOMY  1992   COLONOSCOPY WITH PROPOFOL N/A 06/23/2020   large amount of stool in entire colon, prep inadequate.    CYSTOSCOPY W/ URETERAL STENT PLACEMENT  05/08/2012   Procedure: CYSTOSCOPY WITH RETROGRADE PYELOGRAM/URETERAL STENT PLACEMENT;  Surgeon: Ky Barban, MD;  Location: [facility];  Service: Urology;  Laterality: Right;   CYSTOSCOPY WITH RETROGRADE PYELOGRAM, URETEROSCOPY AND STENT PLACEMENT Right 05/04/2020   Procedure: CYSTOSCOPY WITH RIGHT RETROGRADE PYELOGRAM, RIGHT URETEROSCOPY AND RIGHT URETERAL STENT EXCHANGE;  Surgeon: Malen Gauze, MD;  Location: [facility];  Service: Urology;  Laterality: Right;   CYSTOSCOPY WITH RETROGRADE PYELOGRAM, URETEROSCOPY AND STENT PLACEMENT Left 08/08/2020   Procedure: CYSTOSCOPY WITH RETROGRADE PYELOGRAM, URETEROSCOPY WITH LASER  AND STENT PLACEMENT;  Surgeon: Wilkie Aye  L, MD;  Location: [facility];  Service: Urology;  Laterality: Left;   CYSTOSCOPY WITH RETROGRADE PYELOGRAM, URETEROSCOPY AND STENT PLACEMENT Right 11/08/2020   Procedure: CYSTOSCOPY WITH RIGHT RETROGRADE PYELOGRAM, RIGHT URETEROSCOPY AND STENT PLACEMENT;  Surgeon: Malen Gauze, MD;  Location: [facility];  Service: Urology;  Laterality: Right;   CYSTOSCOPY WITH RETROGRADE PYELOGRAM, URETEROSCOPY AND STENT PLACEMENT  Left 12/27/2020   Procedure: CYSTOSCOPY WITH LEFT RETROGRADE PYELOGRAM, LEFT URETEROSCOPY AND LEFT URETERAL STENT PLACEMENT;  Surgeon: Malen Gauze, MD;  Location: [facility];  Service: Urology;  Laterality: Left;   CYSTOSCOPY WITH RETROGRADE PYELOGRAM, URETEROSCOPY AND STENT PLACEMENT Bilateral 03/16/2021   Procedure: CYSTOSCOPY WITH BILATERAL  RETROGRADE PYELOGRAM, BILATERAL URETEROSCOPY AND STENT PLACEMENT ON THE LEFT;  Surgeon: Malen Gauze, MD;  Location: [facility];  Service: Urology;  Laterality: Bilateral;   CYSTOSCOPY WITH STENT PLACEMENT Right 02/25/2020   Procedure: CYSTOSCOPY WITH RIGHT RETROGRADE RIGHT STENT PLACEMENT;  Surgeon: Jerilee Field, MD;  Location: [facility];  Service: Urology;  Laterality: Right;   ESOPHAGOGASTRODUODENOSCOPY (EGD) WITH PROPOFOL N/A 06/23/2020   Normal esophagus, multiple localized erosions were found in gastric antrum s/p biopsy, normal duodenum. Empiric dilation.Reactive gastropathy with erosions, negative H.pylori, no dysplasia.     EXTRACORPOREAL SHOCK WAVE LITHOTRIPSY Right 10/11/2020   Procedure: EXTRACORPOREAL SHOCK WAVE LITHOTRIPSY (ESWL);  Surgeon: Malen Gauze, MD;  Location: [facility];  Service: Urology;  Laterality: Right;   FOOT SURGERY Right March, 2013   [facility]-removal of bone spur   HOLMIUM LASER APPLICATION  05/04/2020   Procedure: HOLMIUM LASER LITHOTRIPSY RIGHT URETERAL CALCULUS;  Surgeon: Malen Gauze, MD;  Location: [facility];  Service: Urology;;   HYSTEROSCOPY WITH THERMACHOICE  04/25/2012   Procedure: HYSTEROSCOPY WITH THERMACHOICE;  Surgeon: Lazaro Arms, MD;  Location: [facility];  Service: Gynecology;  Laterality: N/A;  total therapy time= 11 minutes 42 seconds; 34 ml D5w  in and 34 ml D5w out; temp =87 degrees F   LEFT HEART CATHETERIZATION WITH CORONARY ANGIOGRAM N/A 12/11/2012   Procedure: LEFT HEART CATHETERIZATION WITH CORONARY ANGIOGRAM;  Surgeon: Runell Gess, MD;  Location: [facility] CATH LAB;  Service:  Cardiovascular;  Laterality: N/A;   MALONEY DILATION N/A 06/23/2020   Procedure: [name] DILATION;  Surgeon: Corbin Ade, MD;  Location: [facility];  Service: Endoscopy;  Laterality: N/A;   Sinus sergery  1988   [place]   STONE EXTRACTION WITH BASKET Right 11/08/2020   Procedure: STONE EXTRACTION WITH BASKET;  Surgeon: Malen Gauze, MD;  Location: [facility];  Service: Urology;  Laterality: Right;   UMBILICAL HERNIA REPAIR N/A 03/25/2014   Procedure: UMBILICAL HERNIA REPAIR;  Surgeon: Marlane Hatcher, MD;  Location: [facility];  Service: General;  Laterality: N/A;   uterine ablation       OB History     Gravida  2   Para  2   Term  2   Preterm      AB      Living  2      SAB      IAB      Ectopic      Multiple      Live Births              Family History  Problem Relation Age of Onset   Diabetes Mother    Depression Paternal Grandmother    Depression Paternal Grandfather    Heart disease Other    Cancer Other    Diabetes Other  Achalasia Other    Anesthesia problems Neg Hx    Hypotension Neg Hx    Malignant hyperthermia Neg Hx    Pseudochol deficiency Neg Hx     Social History   Tobacco Use   Smoking status: Never   Smokeless tobacco: Never  Substance Use Topics   Alcohol use: No   Drug use: No    Home Medications Prior to Admission medications   Medication Sig Start Date End Date Taking? Authorizing Provider  albuterol (VENTOLIN HFA) 108 (90 Base) MCG/ACT inhaler Inhale 2 puffs into the lungs every 6 (six) hours as needed for wheezing or shortness of breath.  08/27/19   [provider]  ALPRAZolam Prudy Feeler) 1 MG tablet Take 1 mg by mouth at bedtime. 02/04/18   [provider]  ARIPiprazole (ABILIFY) 2 MG tablet Take 2 mg by mouth at bedtime.  04/13/19   [provider]  buPROPion (WELLBUTRIN XL) 150 MG 24 hr tablet Take 150 mg by mouth daily.  12/29/17   [provider]  desvenlafaxine (PRISTIQ) 50 MG  24 hr tablet Take 50 mg by mouth daily. 02/02/21   [provider]  gabapentin (NEURONTIN) 600 MG tablet Take 600 mg by mouth 3 (three) times daily.    [provider]  HUMALOG 100 UNIT/ML injection Inject 3.5 Units into the skin continuous. 3.5 units every 1 hr-insulin pump 01/06/15   [provider]  linaclotide [facility]) 145 MCG CAPS capsule Take 1 capsule (145 mcg total) by mouth daily before breakfast. 03/14/21   Gelene Mink, NP  lisinopril (ZESTRIL) 10 MG tablet Take 10 mg by mouth daily.    [provider]  omeprazole (PRILOSEC) 40 MG capsule Take 40 mg by mouth daily. 02/24/20   [provider]  ondansetron (ZOFRAN) 4 MG tablet Take 1 tablet (4 mg total) by mouth every 8 (eight) hours as needed. 12/27/20   McKenzie, Mardene Celeste, MD  oxybutynin (DITROPAN) 5 MG tablet Take 5 mg by mouth at bedtime.  09/11/20   [provider]  oxyCODONE-acetaminophen (PERCOCET) 5-325 MG tablet Take 1 tablet by mouth every 4 (four) hours as needed for moderate pain or severe pain. 03/29/21 03/29/22  Malen Gauze, MD  pantoprazole (PROTONIX) 20 MG tablet Take 20 mg by mouth 2 (two) times daily. 01/29/21   [provider]  phentermine (ADIPEX-P) 37.5 MG tablet Take 37.5 mg by mouth daily before breakfast.    [provider]  Potassium 99 MG TABS Take 99 mg by mouth daily.    [provider]  Potassium Citrate (UROCIT-K 15) 15 MEQ (1620 MG) TBCR Take 1 tablet by mouth in the morning and at bedtime. 05/10/21   McKenzie, Mardene Celeste, MD  simvastatin (ZOCOR) 40 MG tablet Take 40 mg by mouth daily.  03/29/19   [provider]    Allergies    Ciprofloxacin, Penicillins, Codeine, Morphine, Sulfonamide derivatives, and Vancomycin  Review of Systems   Review of Systems  Constitutional:  Negative for activity change, appetite change and fever.  HENT:  Negative for congestion and rhinorrhea.   Respiratory:  Positive for cough, chest  tightness and shortness of breath.   Cardiovascular:  Positive for chest pain. Negative for leg swelling.  Gastrointestinal:  Negative for abdominal pain, nausea and vomiting.  Genitourinary:  Negative for dysuria and hematuria.  Musculoskeletal:  Negative for arthralgias and myalgias.  Skin:  Negative for rash.  Neurological:  Negative for dizziness, weakness and headaches.   all  other systems are negative except as noted in the HPI and PMH.   Physical Exam Updated Vital Signs BP 128/62   Pulse 61   Temp 98.3 F (36.8 C) (Oral)   Resp 16   Ht  (1.422 m)   Wt 95.3 kg   LMP 07/16/2012   SpO2 95%   BMI 47.08 kg/m   Physical Exam Vitals and nursing note reviewed.  Constitutional:      General: She is not in acute distress.    Appearance: She is well-developed. She is obese.  HENT:     Head: Normocephalic and atraumatic.     Mouth/Throat:     Pharynx: No oropharyngeal exudate.  Eyes:     Conjunctiva/sclera: Conjunctivae normal.     Pupils: Pupils are equal, round, and reactive to light.  Neck:     Comments: No meningismus. Cardiovascular:     Rate and Rhythm: Normal rate and regular rhythm.     Heart sounds: Normal heart sounds. No murmur heard. Pulmonary:     Effort: Pulmonary effort is normal. No respiratory distress.     Breath sounds: Normal breath sounds. No wheezing.     Comments: Reproducible central chest tenderness Diminished breath sounds bilaterally Chest:     Chest wall: Tenderness present.  Abdominal:     Palpations: Abdomen is soft.     Tenderness: There is no abdominal tenderness. There is no guarding or rebound.  Musculoskeletal:        General: No tenderness. Normal range of motion.     Cervical back: Normal range of motion and neck supple.  Skin:    General: Skin is warm.     Capillary Refill: Capillary refill takes less than 2 seconds.  Neurological:     General: No focal deficit present.     Mental Status: She is alert and oriented to  person, place, and time. Mental status is at baseline.     Cranial Nerves: No cranial nerve deficit.     Motor: No abnormal muscle tone.     Coordination: Coordination normal.     Comments:  5/5 strength throughout. CN 2-12 intact.Equal grip strength.   Psychiatric:        Behavior: Behavior normal.    ED Results / Procedures / Treatments   Labs (all labs ordered are listed, but only abnormal results are displayed) Labs Reviewed  BASIC METABOLIC PANEL - Abnormal; Notable for the following components:      Result Value   Glucose, Bld 224 (*)    All other components within normal limits  CBC  D-DIMER, QUANTITATIVE  TROPONIN I (HIGH SENSITIVITY)  TROPONIN I (HIGH SENSITIVITY)    EKG EKG Interpretation  Date/Time:  Friday June 23 2021 22:54:07 EDT Ventricular Rate:  72 PR Interval:  153 QRS Duration: 79 QT Interval:  386 QTC Calculation: 423 R Axis:   36 Text Interpretation: Sinus rhythm Alvarado voltage, precordial leads No significant change was found Confirmed by Glynn Octave [phone]) on 06/23/2021 10:59:39 PM  Radiology DG Chest Port 1 View  Result Date: 06/23/2021 CLINICAL DATA:  Chest pain EXAM: PORTABLE CHEST 1 VIEW COMPARISON:  06/21/2021 FINDINGS: Lungs volumes are small, but are symmetric and are clear. No pneumothorax or pleural effusion. Cardiac size within normal limits. Pulmonary vascularity is normal. Osseous structures are age-appropriate. No acute bone abnormality. IMPRESSION: No active disease. Electronically Signed   By: Helyn Numbers MD   On: 06/23/2021 23:17    Procedures Procedures   Medications Ordered  in ED Medications  ketorolac (TORADOL) 30 MG/ML injection 30 mg (has no administration in time range)  albuterol (VENTOLIN HFA) 108 (90 Base) MCG/ACT inhaler 2 puff (has no administration in time range)    ED Course  I have reviewed the triage vital signs and the nursing notes.  Pertinent labs & imaging results that were available during my care of  the patient were reviewed by me and considered in my medical decision making (see chart for details).    MDM Rules/Calculators/A&P                          Shortness of breath, chest pain and coughing since being diagnosed with COVID June 22.  No hypoxia or increased work of breathing. EKG is nonischemic.  Lungs are clear bilaterally.  Chest x-ray is negative.  EKG is nonischemic.  Troponin negative and D-dimer negative.  Given her ongoing dyspnea will obtain CT imaging to rule out pulmonary embolism despite negative D-dimer.  No hypoxia with ambulation.  Troponin negative x2.  CT shows no pulmonary embolism and shows no pneumonia.  Ambulatory without desaturation.  Discussed with patient that dyspnea may persist for several weeks to months after COVID infection.  Recommend follow-up with her PCP as well as pulmonology.  No indication for further steroids or other treatments at this time. Will treat with cough suppressants, oral hydration and PCP follow-up. Alvarado suspicion for ischemic etiology of her chest pain as it is reproducible and worse with coughing.  EKG unchanged troponin negative x2.  Return to the ED with increased difficulty breathing, exertional chest pain, pain associated with diaphoresis, vomiting, dizziness or any other concerns. Final Clinical Impression(s) / ED Diagnoses Final diagnoses:  Atypical chest pain  Post-COVID syndrome    Rx / DC Orders ED Discharge Orders     None        Reiana Poteet, Jeannett Senior, MD 06/24/21 0600

## 2021-06-28 ENCOUNTER — Other Ambulatory Visit (HOSPITAL_COMMUNITY): Payer: Self-pay

## 2021-06-29 ENCOUNTER — Other Ambulatory Visit (INDEPENDENT_AMBULATORY_CARE_PROVIDER_SITE_OTHER): Payer: Self-pay | Admitting: Internal Medicine

## 2021-06-29 ENCOUNTER — Other Ambulatory Visit (HOSPITAL_COMMUNITY): Payer: Self-pay

## 2021-06-29 DIAGNOSIS — N200 Calculus of kidney: Secondary | ICD-10-CM

## 2021-06-29 MED ORDER — LORAZEPAM 0.5 MG PO TABS
0.5000 mg | ORAL_TABLET | Freq: Every day | ORAL | 0 refills | Status: DC | PRN
  Filled 2021-06-29: qty 30, 30d supply, fill #0

## 2021-06-29 MED ORDER — LORAZEPAM 0.5 MG PO TABS
0.5000 mg | ORAL_TABLET | Freq: Every day | ORAL | 1 refills | Status: DC | PRN
  Filled 2021-06-29: qty 30, 30d supply, fill #0

## 2021-06-29 MED ORDER — ONDANSETRON 4 MG PO TBDP
4.0000 mg | ORAL_TABLET | Freq: Three times a day (TID) | ORAL | 11 refills | Status: DC | PRN
  Filled 2021-06-29: qty 30, 10d supply, fill #0

## 2021-06-29 MED ORDER — PHENTERMINE HCL 37.5 MG PO TABS: 37.5000 mg | ORAL_TABLET | ORAL | 1 refills | Status: DC

## 2021-06-29 MED ORDER — INSULIN LISPRO 100 UNIT/ML IJ SOLN
INTRAMUSCULAR | 0 refills | Status: DC
  Filled 2021-06-29: qty 40, 50d supply, fill #0

## 2021-06-29 MED ORDER — GABAPENTIN 600 MG PO TABS
600.0000 mg | ORAL_TABLET | Freq: Three times a day (TID) | ORAL | 0 refills | Status: DC
  Filled 2021-06-29: qty 90, 30d supply, fill #0

## 2021-06-29 MED ORDER — DESVENLAFAXINE SUCCINATE ER 50 MG PO TB24
50.0000 mg | ORAL_TABLET | Freq: Every day | ORAL | 1 refills | Status: DC
  Filled 2021-06-29: qty 30, 30d supply, fill #0
  Filled 2021-08-10: qty 30, 30d supply, fill #1

## 2021-06-29 MED ORDER — SIMVASTATIN 40 MG PO TABS
40.0000 mg | ORAL_TABLET | Freq: Every evening | ORAL | 0 refills | Status: DC
  Filled 2021-06-29: qty 30, 30d supply, fill #0
  Filled 2021-08-10: qty 30, 30d supply, fill #1

## 2021-06-29 MED ORDER — PHENTERMINE HCL 37.5 MG PO TABS
37.5000 mg | ORAL_TABLET | ORAL | 1 refills | Status: DC
  Filled 2021-06-29: qty 15, 30d supply, fill #0

## 2021-06-29 MED ORDER — CYCLOBENZAPRINE HCL 5 MG PO TABS
5.0000 mg | ORAL_TABLET | Freq: Three times a day (TID) | ORAL | 0 refills | Status: DC | PRN
  Filled 2021-06-29: qty 30, 10d supply, fill #0

## 2021-06-29 MED ORDER — OXYBUTYNIN CHLORIDE 5 MG PO TABS
5.0000 mg | ORAL_TABLET | Freq: Every day | ORAL | 2 refills | Status: DC
  Filled 2021-06-29: qty 90, 90d supply, fill #0

## 2021-06-29 MED ORDER — BUPROPION HCL ER (XL) 150 MG PO TB24
150.0000 mg | ORAL_TABLET | Freq: Every morning | ORAL | 0 refills | Status: DC
  Filled 2021-06-29: qty 30, 30d supply, fill #0

## 2021-06-29 MED ORDER — LISINOPRIL-HYDROCHLOROTHIAZIDE 20-12.5 MG PO TABS
1.0000 | ORAL_TABLET | Freq: Every day | ORAL | 1 refills | Status: DC
  Filled 2021-06-29: qty 30, 30d supply, fill #0

## 2021-06-29 MED ORDER — PANTOPRAZOLE SODIUM 40 MG PO TBEC
40.0000 mg | DELAYED_RELEASE_TABLET | Freq: Two times a day (BID) | ORAL | 0 refills | Status: DC
  Filled 2021-06-29: qty 180, 90d supply, fill #0

## 2021-06-30 ENCOUNTER — Other Ambulatory Visit (HOSPITAL_COMMUNITY): Payer: Self-pay

## 2021-06-30 ENCOUNTER — Ambulatory Visit: Payer: Self-pay | Admitting: Cardiovascular Disease

## 2021-07-13 ENCOUNTER — Other Ambulatory Visit (HOSPITAL_COMMUNITY): Payer: Self-pay

## 2021-07-24 ENCOUNTER — Other Ambulatory Visit (HOSPITAL_COMMUNITY): Payer: Self-pay

## 2021-07-24 MED ORDER — PREDNISONE 10 MG (21) PO TBPK
ORAL_TABLET | ORAL | 0 refills | Status: DC
  Filled 2021-07-24: qty 21, 6d supply, fill #0

## 2021-07-24 MED ORDER — GABAPENTIN 600 MG PO TABS
600.0000 mg | ORAL_TABLET | Freq: Three times a day (TID) | ORAL | 2 refills | Status: DC
  Filled 2021-07-24: qty 90, 30d supply, fill #0
  Filled 2021-08-24: qty 90, 30d supply, fill #1
  Filled 2021-11-24: qty 90, 30d supply, fill #2

## 2021-07-24 MED ORDER — LORAZEPAM 0.5 MG PO TABS
0.5000 mg | ORAL_TABLET | Freq: Every day | ORAL | 0 refills | Status: DC | PRN
  Filled 2021-07-24 – 2021-08-10 (×3): qty 30, 30d supply, fill #0

## 2021-07-24 MED ORDER — LISINOPRIL-HYDROCHLOROTHIAZIDE 20-12.5 MG PO TABS
1.0000 | ORAL_TABLET | Freq: Every day | ORAL | 2 refills | Status: DC
  Filled 2021-07-24: qty 30, 30d supply, fill #0
  Filled 2021-08-24: qty 60, 60d supply, fill #1

## 2021-07-24 MED ORDER — HYDROCORTISONE 2.5 % EX CREA
TOPICAL_CREAM | CUTANEOUS | 0 refills | Status: DC
  Filled 2021-07-24: qty 20, 10d supply, fill #0

## 2021-07-28 ENCOUNTER — Other Ambulatory Visit (HOSPITAL_COMMUNITY): Payer: Self-pay

## 2021-07-28 MED ORDER — ALBUTEROL SULFATE HFA 108 (90 BASE) MCG/ACT IN AERS: 2.0000 | INHALATION_SPRAY | RESPIRATORY_TRACT | 2 refills | Status: DC | PRN

## 2021-07-31 ENCOUNTER — Other Ambulatory Visit (HOSPITAL_COMMUNITY): Payer: Self-pay

## 2021-07-31 MED ORDER — AIRDUO DIGIHALER 113-14 MCG/ACT IN AEPB
1.0000 | INHALATION_SPRAY | Freq: Two times a day (BID) | RESPIRATORY_TRACT | 0 refills | Status: DC
  Filled 2021-07-31: qty 1, 30d supply, fill #0

## 2021-08-08 ENCOUNTER — Other Ambulatory Visit (HOSPITAL_COMMUNITY): Payer: Self-pay

## 2021-08-10 ENCOUNTER — Other Ambulatory Visit: Payer: Self-pay

## 2021-08-10 ENCOUNTER — Other Ambulatory Visit (HOSPITAL_COMMUNITY): Payer: Self-pay

## 2021-08-10 ENCOUNTER — Ambulatory Visit (HOSPITAL_COMMUNITY)
Admission: RE | Admit: 2021-08-10 | Discharge: 2021-08-10 | Disposition: A | Discharge status: Home / Self Care | Payer: No Typology Code available for payment source | Source: Ambulatory Visit | Attending: Urology | Admitting: Urology

## 2021-08-10 DIAGNOSIS — N200 Calculus of kidney: Secondary | ICD-10-CM | POA: Diagnosis present

## 2021-08-11 ENCOUNTER — Ambulatory Visit (HOSPITAL_COMMUNITY): Payer: No Typology Code available for payment source

## 2021-08-12 ENCOUNTER — Other Ambulatory Visit: Payer: Self-pay

## 2021-08-12 ENCOUNTER — Encounter: Payer: Self-pay | Admitting: Emergency Medicine

## 2021-08-12 ENCOUNTER — Ambulatory Visit
Admission: EM | Admit: 2021-08-12 | Discharge: 2021-08-12 | Disposition: A | Discharge status: Home / Self Care | Payer: No Typology Code available for payment source | Attending: Internal Medicine | Admitting: Internal Medicine

## 2021-08-12 DIAGNOSIS — M5441 Lumbago with sciatica, right side: Secondary | ICD-10-CM

## 2021-08-12 MED ORDER — METHOCARBAMOL 500 MG PO TABS: 500.0000 mg | ORAL_TABLET | Freq: Every evening | ORAL | 0 refills | Status: DC | PRN

## 2021-08-12 MED ORDER — IBUPROFEN 800 MG PO TABS: 800.0000 mg | ORAL_TABLET | Freq: Three times a day (TID) | ORAL | 0 refills | Status: DC

## 2021-08-12 MED ORDER — KETOROLAC TROMETHAMINE 30 MG/ML IJ SOLN
30.0000 mg | Freq: Once | INTRAMUSCULAR | Status: AC
  Administered 2021-08-12: 30 mg via INTRAMUSCULAR

## 2021-08-12 NOTE — Discharge Instructions (Addendum)
Gentle range of motion exercises Heating pad use on a 15 minutes on-15 minutes of cycle twice daily Back strengthening exercises If you have worsening symptoms please return to urgent care to be reevaluated. There is no indication for x-ray at this time.

## 2021-08-12 NOTE — ED Provider Notes (Signed)
[facility]    CSN: [id number] Arrival date & time: 08/12/21  1426      History   Chief Complaint No chief complaint on file.   HPI [name] is a 55 y.o. female comes to the urgent care with 1 day history of right lower back and gluteal pain.  Pain is currently severe, sharp and aggravated by bending over.  Pain radiates to the right leg.  No falls or trauma.  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  No fall.  Patient has some numbness in the right foot.  No weakness in the right leg.  Patient is able to bear weight on the right leg.Marland Kitchen   HPI  Past Medical History:  Diagnosis Date   Diabetes mellitus    insulin pump 2013   Hypercholesteremia    Hypertension    Kidney stone    Neuropathy    Obesity    Obstructive sleep apnea    Palpitations    Shortness of breath     Patient Active Problem List   Diagnosis Date Noted   Dyspepsia 01/18/2021   Constipation 01/18/2021   Dorsalgia 10/31/2020   Gross hematuria 04/04/2020   GERD (gastroesophageal reflux disease) 03/09/2020   Dysphagia 03/09/2020   Hypokalemia 02/26/2020   Ureterolithiasis 02/26/2020   Hyperglycemia 02/25/2020   Preventative health care 07/15/2019   Other intervertebral disc degeneration, lumbar region 05/24/2019   Palpitations 12/08/2014   Chest pain 12/08/2014   Obstructive sleep apnea 12/08/2014   Incarcerated umbilical hernia 03/25/2014   Cellulitis 02/19/2014   Pelvic pain 07/15/2013   Depression 07/15/2013   Decreased libido 12/23/2012   Dyspnea 11/12/2012   Headache(784.0) 10/14/2012   Neuropathy 10/14/2012   Candidal intertrigo 09/12/2012   Diabetes mellitus type II, uncontrolled (HCC) 05/10/2012   Hypertension 05/10/2012   HYPERLIPIDEMIA 09/29/2008   OBESITY 08/31/2008   Kidney stones 08/31/2008    Past Surgical History:  Procedure Laterality Date   BIOPSY   06/23/2020   Procedure: BIOPSY;  Surgeon: Corbin Ade, MD;  [facility]: [facility];  Service: Endoscopy;;   CARDIAC CATHETERIZATION  12/11/2012   normal coronary arteries   CESAREAN SECTION  1994;2000   [facility]   CHOLECYSTECTOMY  1992   COLONOSCOPY WITH PROPOFOL N/A 06/23/2020   large amount of stool in entire colon, prep inadequate.    CYSTOSCOPY W/ URETERAL STENT PLACEMENT  05/08/2012   Procedure: CYSTOSCOPY WITH RETROGRADE PYELOGRAM/URETERAL STENT PLACEMENT;  Surgeon: Ky Barban, MD;  Location: [facility];  Service: Urology;  Laterality: Right;   CYSTOSCOPY WITH RETROGRADE PYELOGRAM, URETEROSCOPY AND STENT PLACEMENT Right 05/04/2020   Procedure: CYSTOSCOPY WITH RIGHT RETROGRADE PYELOGRAM, RIGHT URETEROSCOPY AND RIGHT URETERAL STENT EXCHANGE;  Surgeon: Malen Gauze, MD;  Location: [facility];  Service: Urology;  Laterality: Right;   CYSTOSCOPY WITH RETROGRADE PYELOGRAM, URETEROSCOPY AND STENT PLACEMENT Left 08/08/2020   Procedure: CYSTOSCOPY WITH RETROGRADE PYELOGRAM, URETEROSCOPY WITH LASER  AND STENT PLACEMENT;  Surgeon: Malen Gauze, MD;  Location: [facility];  Service: Urology;  Laterality: Left;   CYSTOSCOPY WITH RETROGRADE PYELOGRAM, URETEROSCOPY AND STENT PLACEMENT Right 11/08/2020   Procedure: CYSTOSCOPY WITH RIGHT RETROGRADE PYELOGRAM, RIGHT URETEROSCOPY AND STENT PLACEMENT;  Surgeon: Malen Gauze, MD;  Location: [facility];  Service: Urology;  Laterality: Right;   CYSTOSCOPY WITH RETROGRADE PYELOGRAM, URETEROSCOPY AND STENT PLACEMENT Left 12/27/2020   Procedure: CYSTOSCOPY WITH LEFT RETROGRADE PYELOGRAM, LEFT URETEROSCOPY  AND LEFT URETERAL STENT PLACEMENT;  Surgeon: Malen Gauze, MD;  Location: [facility];  Service: Urology;  Laterality: Left;   CYSTOSCOPY WITH RETROGRADE PYELOGRAM, URETEROSCOPY AND STENT PLACEMENT Bilateral 03/16/2021   Procedure: CYSTOSCOPY WITH BILATERAL  RETROGRADE PYELOGRAM, BILATERAL URETEROSCOPY AND STENT PLACEMENT ON THE LEFT;  Surgeon: Malen Gauze, MD;  Location: [facility];  Service: Urology;  Laterality: Bilateral;   CYSTOSCOPY WITH STENT PLACEMENT Right 02/25/2020   Procedure: CYSTOSCOPY WITH RIGHT RETROGRADE RIGHT STENT PLACEMENT;  Surgeon: Jerilee Field, MD;  Location: [facility];  Service: Urology;  Laterality: Right;   ESOPHAGOGASTRODUODENOSCOPY (EGD) WITH PROPOFOL N/A 06/23/2020   Normal esophagus, multiple localized erosions were found in gastric antrum s/p biopsy, normal duodenum. Empiric dilation.Reactive gastropathy with erosions, negative H.pylori, no dysplasia.     EXTRACORPOREAL SHOCK WAVE LITHOTRIPSY Right 10/11/2020   Procedure: EXTRACORPOREAL SHOCK WAVE LITHOTRIPSY (ESWL);  Surgeon: Malen Gauze, MD;  Location: [facility];  Service: Urology;  Laterality: Right;   FOOT SURGERY Right March, 2013   [facility]-removal of bone spur   HOLMIUM LASER APPLICATION  05/04/2020   Procedure: HOLMIUM LASER LITHOTRIPSY RIGHT URETERAL CALCULUS;  Surgeon: Malen Gauze, MD;  Location: [facility];  Service: Urology;;   HYSTEROSCOPY WITH THERMACHOICE  04/25/2012   Procedure: HYSTEROSCOPY WITH THERMACHOICE;  Surgeon: Lazaro Arms, MD;  Location: [facility];  Service: Gynecology;  Laterality: N/A;  total therapy time= 11 minutes 42 seconds; 34 ml D5w  in and 34 ml D5w out; temp =87 degrees F   LEFT HEART CATHETERIZATION WITH CORONARY ANGIOGRAM N/A 12/11/2012   Procedure: LEFT HEART CATHETERIZATION WITH CORONARY ANGIOGRAM;  Surgeon: Runell Gess, MD;  Location: [facility] C[facility];  Service: Cardiovascular;  Laterality: N/A;   MALONEY DILATION N/A 06/23/2020   Procedure: [name] DILATION;  Surgeon: Corbin Ade, MD;  [facility];  Service: Endoscopy;  Laterality: N/A;   Sinus sergery  1988   [place]   STONE EXTRACTION WITH BASKET Right 11/08/2020   Procedure: STONE EXTRACTION WITH BASKET;  Surgeon: Malen Gauze, MD;  Location: [facility];  Service: Urology;  Laterality: Right;   UMBILICAL HERNIA REPAIR N/A 03/25/2014    Procedure: UMBILICAL HERNIA REPAIR;  Surgeon: Marlane Hatcher, MD;  Location: [facility];  Service: General;  Laterality: N/A;   uterine ablation      OB History     Gravida  2   Para  2   Term  2   Preterm      AB      Living  2      SAB      IAB      Ectopic      Multiple      Live Births               Home Medications    Prior to Admission medications   Medication Sig Start Date End Date Taking? Authorizing Provider  ibuprofen (ADVIL) 800 MG tablet Take 1 tablet (800 mg total) by mouth 3 (three) times daily. 08/12/21  Yes Maricus Tanzi, Britta Mccreedy, MD  methocarbamol (ROBAXIN) 500 MG tablet Take 1 tablet (500 mg total) by mouth at bedtime as needed for muscle spasms. 08/12/21  Yes Eduarda Scrivens, Britta Mccreedy, MD  albuterol (VENTOLIN HFA) 108 (90 Base) MCG/ACT inhaler Inhale 2 puffs into the lungs every 6 (six) hours as needed for wheezing or shortness of breath.  08/27/19   [provider]  albuterol (VENTOLIN HFA) 108 (90 Base) MCG/ACT  inhaler Inhale 2 puffs into the lungs every 4 (four) hours as needed. 07/28/21     ALPRAZolam (XANAX) 1 MG tablet Take 1 mg by mouth at bedtime. 02/04/18   [provider]  ARIPiprazole (ABILIFY) 2 MG tablet Take 2 mg by mouth at bedtime.  04/13/19   [provider]  benzonatate (TESSALON) 100 MG capsule Take 1 capsule (100 mg total) by mouth 3 (three) times daily as needed for cough. 06/24/21   Rancour, Jeannett Senior, MD  buPROPion (WELLBUTRIN XL) 150 MG 24 hr tablet Take 150 mg by mouth daily.  12/29/17   [provider]  buPROPion (WELLBUTRIN XL) 150 MG 24 hr tablet Take 1 tablet (150 mg total) by mouth once daily in the morning. 01/01/21     desvenlafaxine (PRISTIQ) 50 MG 24 hr tablet Take 50 mg by mouth daily. 02/02/21   [provider]  desvenlafaxine (PRISTIQ) 50 MG 24 hr tablet Take 1 tablet (50 mg total) by mouth daily. 04/10/21     Fluticasone-Salmeterol,sensor, (AIRDUO DIGIHALER) 113-14 MCG/ACT AEPB Inhale 1 puff  into the lungs 2 (two) times daily. 07/29/21     gabapentin (NEURONTIN) 600 MG tablet Take 600 mg by mouth 3 (three) times daily.    [provider]  gabapentin (NEURONTIN) 600 MG tablet Take 1 tablet (600 mg total) by mouth 3 (three) times daily. 01/01/21     gabapentin (NEURONTIN) 600 MG tablet TAKE 1 TABLET BY MOUTH THREE TIMES DAILY 07/24/21     HUMALOG 100 UNIT/ML injection Inject 3.5 Units into the skin continuous. 3.5 units every 1 hr-insulin pump 01/06/15   [provider]  hydrocortisone 2.5 % cream APPLY A THIN LAYER TO THE AFFECTED AREA(S) BY TOPICAL ROUTE 2 TIMES PER DAY 07/24/21     insulin lispro (HUMALOG) 100 UNIT/ML injection Inject 80 units under the skin via omnipod daily 05/22/21     linaclotide (LINZESS) 145 MCG CAPS capsule Take 1 capsule (145 mcg total) by mouth daily before breakfast. 03/14/21   Gelene Mink, NP  lisinopril (ZESTRIL) 10 MG tablet Take 10 mg by mouth daily.    [provider]  lisinopril-hydrochlorothiazide (ZESTORETIC) 20-12.5 MG tablet Take 1 tablet by mouth daily. 04/10/21     lisinopril-hydrochlorothiazide (ZESTORETIC) 20-12.5 MG tablet TAKE 1 TABLET BY MOUTH EVERY DAY 07/24/21     LORazepam (ATIVAN) 0.5 MG tablet TAKE 1 TABLET BY MOUTH DAILY AS NEEDED FOR ANXIETY 07/24/21     omeprazole (PRILOSEC) 40 MG capsule Take 40 mg by mouth daily. 02/24/20   [provider]  ondansetron (ZOFRAN) 4 MG tablet Take 1 tablet (4 mg total) by mouth every 8 (eight) hours as needed. 12/27/20   McKenzie, Mardene Celeste, MD  oxybutynin (DITROPAN) 5 MG tablet Take 5 mg by mouth at bedtime.  09/11/20   [provider]  oxybutynin (DITROPAN) 5 MG tablet Take 1 tablet (5 mg total) by mouth daily. 05/09/21     oxyCODONE-acetaminophen (PERCOCET) 5-325 MG tablet Take 1 tablet by mouth every 4 (four) hours as needed for moderate pain or severe pain. 03/29/21 03/29/22  Malen Gauze, MD  pantoprazole (PROTONIX) 20 MG tablet Take 20 mg by mouth 2 (two)  times daily. 01/29/21   [provider]  pantoprazole (PROTONIX) 40 MG tablet Take 1 tablet (40 mg total) by mouth 2 (two) times daily. 10/18/20     phentermine (ADIPEX-P) 37.5 MG tablet Take 37.5 mg by mouth daily before breakfast.    [provider]  phentermine (  ADIPEX-P) 37.5 MG tablet Take 1 tablet (37.5 mg total) by mouth every other day. 05/19/21     Potassium 99 MG TABS Take 99 mg by mouth daily.    [provider]  Potassium Citrate (UROCIT-K 15) 15 MEQ (1620 MG) TBCR Take 1 tablet by mouth in the morning and at bedtime. 05/10/21   McKenzie, Mardene Celeste, MD  predniSONE (STERAPRED UNI-PAK 21 TAB) 10 MG (21) TBPK tablet Take as directed on package 07/24/21     simvastatin (ZOCOR) 40 MG tablet Take 40 mg by mouth daily.  03/29/19   [provider]  simvastatin (ZOCOR) 40 MG tablet Take 1 tablet (40 mg total) by mouth every evening for cholesterol 06/01/21       Family History Family History  Problem Relation Age of Onset   Diabetes Mother    Depression Paternal Grandmother    Depression Paternal Grandfather    Heart disease Other    Cancer Other    Diabetes Other    Achalasia Other    Anesthesia problems Neg Hx    Hypotension Neg Hx    Malignant hyperthermia Neg Hx    Pseudochol deficiency Neg Hx     Social History Social History   Tobacco Use   Smoking status: Never   Smokeless tobacco: Never  Substance Use Topics   Alcohol use: No   Drug use: No     Allergies   Ciprofloxacin, Penicillins, Codeine, Morphine, Sulfonamide derivatives, and Vancomycin   Review of Systems Review of Systems  Respiratory: Negative.    Gastrointestinal: Negative.   Musculoskeletal:  Positive for back pain. Negative for myalgias.  Skin:  Negative for rash and wound.  Neurological:  Positive for numbness. Negative for weakness.    Physical Exam Triage Vital Signs ED Triage Vitals  Enc Vitals Group     BP 08/12/21 1447 138/78     Pulse Rate 08/12/21 1447  93     Resp 08/12/21 1447 16     Temp 08/12/21 1447 98.4 F (36.9 C)     Temp Source 08/12/21 1447 Oral     SpO2 08/12/21 1447 96 %     Weight --      Height --      Head Circumference --      Peak Flow --      Pain Score 08/12/21 1448 10     Pain Loc --      Pain Edu? --      Excl. in GC? --    No data found.  Updated Vital Signs BP 138/78 (BP Location: Right Arm)   Pulse 93   Temp 98.4 F (36.9 C) (Oral)   Resp 16   LMP 07/16/2012   SpO2 96%   Visual Acuity Right Eye Distance:   Left Eye Distance:   Bilateral Distance:    Right Eye Near:   Left Eye Near:    Bilateral Near:     Physical Exam Vitals and nursing note reviewed.  Constitutional:      General: She is in acute distress.     Appearance: She is not ill-appearing.  Cardiovascular:     Rate and Rhythm: Normal rate and regular rhythm.     Pulses: Normal pulses.     Heart sounds: Normal heart sounds.  Pulmonary:     Effort: Pulmonary effort is normal.     Breath sounds: Normal breath sounds.  Musculoskeletal:        General: Tenderness present. No swelling, deformity  or signs of injury. Normal range of motion.     Right lower leg: No edema.     Left lower leg: No edema.  Skin:    General: Skin is warm.  Neurological:     Mental Status: She is alert.     UC Treatments / Results  Labs (all labs ordered are listed, but only abnormal results are displayed) Labs Reviewed - No data to display  EKG   Radiology No results found.  Procedures Procedures (including critical care time)  Medications Ordered in UC Medications  ketorolac (TORADOL) 30 MG/ML injection 30 mg (30 mg Intramuscular Given 08/12/21 1523)    Initial Impression / Assessment and Plan / UC Course  I have reviewed the triage vital signs and the nursing notes.  Pertinent labs & imaging results that were available during my care of the patient were reviewed by me and considered in my medical decision making (see chart for  details).     1.  Acute right-sided sciatica: Ibuprofen 800 mg every 8 hours as needed for pain Robaxin nightly as needed for back stiffness Gentle range of motion exercises Heating pad use recommended Back strengthening exercises Return precautions given No indication for x-rays at this time. Final Clinical Impressions(s) / UC Diagnoses   Final diagnoses:  Acute right-sided low back pain with right-sided sciatica     Discharge Instructions      Gentle range of motion exercises Heating pad use on a 15 minutes on-15 minutes of cycle twice daily Back strengthening exercises If you have worsening symptoms please return to urgent care to be reevaluated. There is no indication for x-ray at this time.     ED Prescriptions     Medication Sig Dispense Auth. Provider   ibuprofen (ADVIL) 800 MG tablet Take 1 tablet (800 mg total) by mouth 3 (three) times daily. 21 tablet Tomoya Ringwald, Britta Mccreedy, MD   methocarbamol (ROBAXIN) 500 MG tablet Take 1 tablet (500 mg total) by mouth at bedtime as needed for muscle spasms. 20 tablet Tamorah Hada, Britta Mccreedy, MD      PDMP not reviewed this encounter.   Merrilee Jansky, MD 08/12/21 [phone]

## 2021-08-12 NOTE — ED Triage Notes (Signed)
Right leg pain from hip down to foot since last night.  No known injury.

## 2021-08-15 ENCOUNTER — Other Ambulatory Visit (HOSPITAL_COMMUNITY): Payer: Self-pay

## 2021-08-15 ENCOUNTER — Other Ambulatory Visit: Payer: Self-pay

## 2021-08-15 ENCOUNTER — Ambulatory Visit (HOSPITAL_COMMUNITY)
Admission: RE | Admit: 2021-08-15 | Discharge: 2021-08-15 | Disposition: A | Discharge status: Home / Self Care | Payer: No Typology Code available for payment source | Source: Ambulatory Visit | Attending: Family Medicine | Admitting: Family Medicine

## 2021-08-15 ENCOUNTER — Other Ambulatory Visit (HOSPITAL_COMMUNITY): Payer: Self-pay | Admitting: Family Medicine

## 2021-08-15 DIAGNOSIS — M5440 Lumbago with sciatica, unspecified side: Secondary | ICD-10-CM | POA: Insufficient documentation

## 2021-08-18 ENCOUNTER — Ambulatory Visit: Payer: No Typology Code available for payment source | Admitting: Urology

## 2021-08-23 ENCOUNTER — Telehealth (INDEPENDENT_AMBULATORY_CARE_PROVIDER_SITE_OTHER): Payer: No Typology Code available for payment source | Admitting: Urology

## 2021-08-23 ENCOUNTER — Encounter: Payer: Self-pay | Admitting: Urology

## 2021-08-23 ENCOUNTER — Other Ambulatory Visit: Payer: Self-pay

## 2021-08-23 ENCOUNTER — Other Ambulatory Visit (HOSPITAL_COMMUNITY): Payer: Self-pay

## 2021-08-23 DIAGNOSIS — N200 Calculus of kidney: Secondary | ICD-10-CM

## 2021-08-23 MED ORDER — POTASSIUM CITRATE ER 15 MEQ (1620 MG) PO TBCR
1.0000 | EXTENDED_RELEASE_TABLET | Freq: Two times a day (BID) | ORAL | 3 refills | Status: DC
  Filled 2021-08-23: qty 180, 90d supply, fill #0

## 2021-08-23 NOTE — Progress Notes (Signed)
08/23/2021 8:59 AM   [name] [birth year] [id number]  Referring provider: Benita Stabile, MD [address] [place],  [place] [address]  Patient location: home Physician location: office I connected with  [name] on 08/23/21 by a video enabled telemedicine application and verified that I am speaking with the correct person using two identifiers.   I discussed the limitations of evaluation and management by telemedicine. The patient expressed understanding and agreed to proceed.   Followup nephrolithiasis   HPI: [name] is a 55yo here for followup for nephrolithiasis. She is having intermittent left flank and is scheduled to see spine specialist for her chronic left flank pain. Renal US from 9/1 shows a nonshadowing 56mm left upper pole calcification. No stone events since last visit. No worsening LUTS. She remains on UrocitK BID. No new complaints today   PMH: Past Medical History:  Diagnosis Date   Diabetes mellitus    insulin pump 2013   Hypercholesteremia    Hypertension    Kidney stone    Neuropathy    Obesity    Obstructive sleep apnea    Palpitations    Shortness of breath     Surgical History: Past Surgical History:  Procedure Laterality Date   BIOPSY  06/23/2020   Procedure: BIOPSY;  Surgeon: Corbin Ade, MD;  [facility];  Service: Endoscopy;;   CARDIAC CATHETERIZATION  12/11/2012   normal coronary arteries   CESAREAN SECTION  1994;2000   [facility]   CHOLECYSTECTOMY  1992   COLONOSCOPY WITH PROPOFOL N/A 06/23/2020   large amount of stool in entire colon, prep inadequate.    CYSTOSCOPY W/ URETERAL STENT PLACEMENT  05/08/2012   Procedure: CYSTOSCOPY WITH RETROGRADE PYELOGRAM/URETERAL STENT PLACEMENT;  Surgeon: Ky Barban, MD;  Location: [facility];  Service: Urology;  Laterality: Right;   CYSTOSCOPY WITH RETROGRADE PYELOGRAM, URETEROSCOPY AND STENT PLACEMENT Right 05/04/2020   Procedure: CYSTOSCOPY WITH RIGHT RETROGRADE  PYELOGRAM, RIGHT URETEROSCOPY AND RIGHT URETERAL STENT EXCHANGE;  Surgeon: Malen Gauze, MD;  Location: [facility];  Service: Urology;  Laterality: Right;   CYSTOSCOPY WITH RETROGRADE PYELOGRAM, URETEROSCOPY AND STENT PLACEMENT Left 08/08/2020   Procedure: CYSTOSCOPY WITH RETROGRADE PYELOGRAM, URETEROSCOPY WITH LASER  AND STENT PLACEMENT;  Surgeon: Malen Gauze, MD;  Location: [facility];  Service: Urology;  Laterality: Left;   CYSTOSCOPY WITH RETROGRADE PYELOGRAM, URETEROSCOPY AND STENT PLACEMENT Right 11/08/2020   Procedure: CYSTOSCOPY WITH RIGHT RETROGRADE PYELOGRAM, RIGHT URETEROSCOPY AND STENT PLACEMENT;  Surgeon: Malen Gauze, MD;  Location: [facility];  Service: Urology;  Laterality: Right;   CYSTOSCOPY WITH RETROGRADE PYELOGRAM, URETEROSCOPY AND STENT PLACEMENT Left 12/27/2020   Procedure: CYSTOSCOPY WITH LEFT RETROGRADE PYELOGRAM, LEFT URETEROSCOPY AND LEFT URETERAL STENT PLACEMENT;  Surgeon: Malen Gauze, MD;  Location: [facility];  Service: Urology;  Laterality: Left;   CYSTOSCOPY WITH RETROGRADE PYELOGRAM, URETEROSCOPY AND STENT PLACEMENT Bilateral 03/16/2021   Procedure: CYSTOSCOPY WITH BILATERAL  RETROGRADE PYELOGRAM, BILATERAL URETEROSCOPY AND STENT PLACEMENT ON THE LEFT;  Surgeon: Malen Gauze, MD;  Location: [facility];  Service: Urology;  Laterality: Bilateral;   CYSTOSCOPY WITH STENT PLACEMENT Right 02/25/2020   Procedure: CYSTOSCOPY WITH RIGHT RETROGRADE RIGHT STENT PLACEMENT;  Surgeon: Jerilee Field, MD;  Location: [facility];  Service: Urology;  Laterality: Right;   ESOPHAGOGASTRODUODENOSCOPY (EGD) WITH PROPOFOL N/A 06/23/2020   Normal esophagus, multiple localized erosions were found in gastric antrum s/p biopsy, normal duodenum. Empiric dilation.Reactive gastropathy with erosions, negative H.pylori, no dysplasia.  EXTRACORPOREAL SHOCK WAVE LITHOTRIPSY Right 10/11/2020   Procedure: EXTRACORPOREAL SHOCK WAVE LITHOTRIPSY (ESWL);  Surgeon: Malen Gauze, MD;   Location: [facility];  Service: Urology;  Laterality: Right;   FOOT SURGERY Right March, 2013   [facility]-removal of bone spur   HOLMIUM LASER APPLICATION  05/04/2020   Procedure: HOLMIUM LASER LITHOTRIPSY RIGHT URETERAL CALCULUS;  Surgeon: Malen Gauze, MD;  Location: [facility];  Service: Urology;;   HYSTEROSCOPY WITH THERMACHOICE  04/25/2012   Procedure: HYSTEROSCOPY WITH THERMACHOICE;  Surgeon: Lazaro Arms, MD;  Location: [facility];  Service: Gynecology;  Laterality: N/A;  total therapy time= 11 minutes 42 seconds; 34 ml D5w  in and 34 ml D5w out; temp =87 degrees F   LEFT HEART CATHETERIZATION WITH CORONARY ANGIOGRAM N/A 12/11/2012   Procedure: LEFT HEART CATHETERIZATION WITH CORONARY ANGIOGRAM;  Surgeon: Runell Gess, MD;  Location: [facility] CATH LAB;  Service: Cardiovascular;  Laterality: N/A;   MALONEY DILATION N/A 06/23/2020   Procedure: [name] DILATION;  Surgeon: Corbin Ade, MD;  [facility];  Service: Endoscopy;  Laterality: N/A;   Sinus sergery  1988   [place]   STONE EXTRACTION WITH BASKET Right 11/08/2020   Procedure: STONE EXTRACTION WITH BASKET;  Surgeon: Malen Gauze, MD;  Location: [facility];  Service: Urology;  Laterality: Right;   UMBILICAL HERNIA REPAIR N/A 03/25/2014   Procedure: UMBILICAL HERNIA REPAIR;  Surgeon: Marlane Hatcher, MD;  Location: [facility];  Service: General;  Laterality: N/A;   uterine ablation      Home Medications:  Allergies as of 08/23/2021       Reactions   Ciprofloxacin Hives, Swelling   Penicillins Anaphylaxis, Rash   Codeine Other (See Comments)   had hallicunations   Morphine Nausea And Vomiting, Other (See Comments)   recieved in ED due to [facility] and had multple doses - gave visual hallucinations and vomitting.   Sulfonamide Derivatives Other (See Comments)   REACTION: Unsure - childhood allergy   Vancomycin Itching        Medication List        Accurate as of August 23, 2021  8:59 AM. If you have  any questions, ask your nurse or doctor.          AirDuo Digihaler 113-14 MCG/ACT Aepb Generic drug: Fluticasone-Salmeterol(sensor) Inhale 1 puff into the lungs 2 (two) times daily.   albuterol 108 (90 Base) MCG/ACT inhaler Commonly known as: VENTOLIN HFA Inhale 2 puffs into the lungs every 6 (six) hours as needed for wheezing or shortness of breath.   albuterol 108 (90 Base) MCG/ACT inhaler Commonly known as: VENTOLIN HFA Inhale 2 puffs into the lungs every 4 (four) hours as needed.   ALPRAZolam 1 MG tablet Commonly known as: XANAX Take 1 mg by mouth at bedtime.   ARIPiprazole 2 MG tablet Commonly known as: ABILIFY Take 2 mg by mouth at bedtime.   benzonatate 100 MG capsule Commonly known as: TESSALON Take 1 capsule (100 mg total) by mouth 3 (three) times daily as needed for cough.   buPROPion 150 MG 24 hr tablet Commonly known as: WELLBUTRIN XL Take 150 mg by mouth daily.   buPROPion 150 MG 24 hr tablet Commonly known as: WELLBUTRIN XL Take 1 tablet (150 mg total) by mouth once daily in the morning.   desvenlafaxine 50 MG 24 hr tablet Commonly known as: PRISTIQ Take 50 mg by mouth daily.   desvenlafaxine 50 MG 24 hr tablet Commonly known as: PRISTIQ  Take 1 tablet (50 mg total) by mouth daily.   gabapentin 600 MG tablet Commonly known as: NEURONTIN Take 600 mg by mouth 3 (three) times daily.   gabapentin 600 MG tablet Commonly known as: NEURONTIN Take 1 tablet (600 mg total) by mouth 3 (three) times daily.   gabapentin 600 MG tablet Commonly known as: NEURONTIN TAKE 1 TABLET BY MOUTH THREE TIMES DAILY   HumaLOG 100 UNIT/ML injection Generic drug: insulin lispro Inject 3.5 Units into the skin continuous. 3.5 units every 1 hr-insulin pump   HumaLOG 100 UNIT/ML injection Generic drug: insulin lispro Inject 80 units under the skin via omnipod daily   hydrocortisone 2.5 % cream APPLY A THIN LAYER TO THE AFFECTED AREA(S) BY TOPICAL ROUTE 2 TIMES PER DAY    ibuprofen 800 MG tablet Commonly known as: ADVIL Take 1 tablet (800 mg total) by mouth 3 (three) times daily.   linaclotide 145 MCG Caps capsule Commonly known as: Linzess Take 1 capsule (145 mcg total) by mouth daily before breakfast.   lisinopril 10 MG tablet Commonly known as: ZESTRIL Take 10 mg by mouth daily.   lisinopril-hydrochlorothiazide 20-12.5 MG tablet Commonly known as: ZESTORETIC Take 1 tablet by mouth daily.   lisinopril-hydrochlorothiazide 20-12.5 MG tablet Commonly known as: ZESTORETIC TAKE 1 TABLET BY MOUTH EVERY DAY   LORazepam 0.5 MG tablet Commonly known as: ATIVAN TAKE 1 TABLET BY MOUTH DAILY AS NEEDED FOR ANXIETY   methocarbamol 500 MG tablet Commonly known as: ROBAXIN Take 1 tablet (500 mg total) by mouth at bedtime as needed for muscle spasms.   omeprazole 40 MG capsule Commonly known as: PRILOSEC Take 40 mg by mouth daily.   ondansetron 4 MG tablet Commonly known as: ZOFRAN Take 1 tablet (4 mg total) by mouth every 8 (eight) hours as needed.   oxybutynin 5 MG tablet Commonly known as: DITROPAN Take 5 mg by mouth at bedtime.   oxybutynin 5 MG tablet Commonly known as: DITROPAN Take 1 tablet (5 mg total) by mouth daily.   oxyCODONE-acetaminophen 5-325 MG tablet Commonly known as: Percocet Take 1 tablet by mouth every 4 (four) hours as needed for moderate pain or severe pain.   pantoprazole 40 MG tablet Commonly known as: PROTONIX Take 1 tablet (40 mg total) by mouth 2 (two) times daily.   pantoprazole 20 MG tablet Commonly known as: PROTONIX Take 20 mg by mouth 2 (two) times daily.   phentermine 37.5 MG tablet Commonly known as: ADIPEX-P Take 37.5 mg by mouth daily before breakfast.   phentermine 37.5 MG tablet Commonly known as: ADIPEX-P Take 1 tablet (37.5 mg total) by mouth every other day.   Potassium 99 MG Tabs Take 99 mg by mouth daily.   Potassium Citrate 15 MEQ (1620 MG) Tbcr Commonly known as: Urocit-K 15 Take 1  tablet by mouth in the morning and at bedtime.   predniSONE 10 MG (21) Tbpk tablet Commonly known as: STERAPRED UNI-PAK 21 TAB Take as directed on package   simvastatin 40 MG tablet Commonly known as: ZOCOR Take 40 mg by mouth daily.   simvastatin 40 MG tablet Commonly known as: ZOCOR Take 1 tablet (40 mg total) by mouth every evening for cholesterol        Allergies:  Allergies  Allergen Reactions   Ciprofloxacin Hives and Swelling   Penicillins Anaphylaxis and Rash   Codeine Other (See Comments)    had hallicunations   Morphine Nausea And Vomiting and Other (See Comments)    recieved in  ED due to [facility] and had multple doses - gave visual hallucinations and vomitting.   Sulfonamide Derivatives Other (See Comments)    REACTION: Unsure - childhood allergy   Vancomycin Itching    Family History: Family History  Problem Relation Age of Onset   Diabetes Mother    Depression Paternal Grandmother    Depression Paternal Grandfather    Heart disease Other    Cancer Other    Diabetes Other    Achalasia Other    Anesthesia problems Neg Hx    Hypotension Neg Hx    Malignant hyperthermia Neg Hx    Pseudochol deficiency Neg Hx     Social History:  reports that she has never smoked. She has never used smokeless tobacco. She reports that she does not drink alcohol and does not use drugs.  ROS: All other review of systems were reviewed and are negative except what is noted above in HPI   Laboratory Data: Lab Results  Component Value Date   WBC 7.5 06/23/2021   HGB 12.8 06/23/2021   HCT 38.4 06/23/2021   MCV 85.0 06/23/2021   PLT 278 06/23/2021    Lab Results  Component Value Date   CREATININE 0.66 06/23/2021    No results found for: PSA  No results found for: TESTOSTERONE  Lab Results  Component Value Date   HGBA1C 7.0 (H) 03/15/2021    Urinalysis    Component Value Date/Time   COLORURINE AMBER (A) 04/04/2020 0309   APPEARANCEUR Clear  05/10/2021 0835   LABSPEC 1.020 04/04/2020 0309   PHURINE 6.0 04/04/2020 0309   GLUCOSEU Negative 05/10/2021 0835   HGBUR LARGE (A) 04/04/2020 0309   HGBUR moderate 08/31/2008 0908   BILIRUBINUR Negative 05/10/2021 0835   KETONESUR NEGATIVE 04/04/2020 0309   PROTEINUR Negative 05/10/2021 0835   PROTEINUR 100 (A) 04/04/2020 0309   UROBILINOGEN 0.2 12/14/2020 0835   UROBILINOGEN 0.2 09/21/2014 1055   NITRITE Negative 05/10/2021 0835   NITRITE NEGATIVE 04/04/2020 0309   LEUKOCYTESUR Negative 05/10/2021 0835   LEUKOCYTESUR SMALL (A) 04/04/2020 0309    Lab Results  Component Value Date   LABMICR Comment 05/10/2021   WBCUA 0-5 03/29/2021   LABEPIT 0-10 03/29/2021   BACTERIA None seen 03/29/2021    Pertinent Imaging: Renal US 08/10/2021: Images reviewed and discussed with the patient  Results for orders placed during the hospital encounter of 10/31/20  DG Abd 1 View  Narrative CLINICAL DATA:  Status post lithotripsy  EXAM: ABDOMEN - 1 VIEW  COMPARISON:  10/10/2020  FINDINGS: Scattered large and small bowel gas is noted. Scattered calculi are noted over the lower pole of the right kidney. The largest of these measures approximately 6 mm. No definitive ureteral stones are noted. Degenerative changes of lumbar spine are seen.  IMPRESSION: Persistent right lower pole renal stones. The largest of these measures approximately 6 mm.   Electronically Signed By: Alcide Clever M.D. On: 10/31/2020 11:38  No results found for this or any previous visit.  No results found for this or any previous visit.  No results found for this or any previous visit.  Results for orders placed during the hospital encounter of 08/10/21  Ultrasound renal complete  Narrative CLINICAL DATA:  Follow-up renal stone.  EXAM: RENAL / URINARY TRACT ULTRASOUND COMPLETE  COMPARISON:  None.  FINDINGS: Right Kidney:  Renal measurements: 11.1 x 5.3 x 7.2 cm = volume: 220 mL. Echogenicity  within normal limits. No mass or hydronephrosis visualized.  Left Kidney:  Renal measurements: 12.6 x 5.4 x 6.0 cm = volume: 213 mL. Echogenicity within normal limits. No mass or hydronephrosis visualized. There is an echogenic focus in the superior pole measuring 7 mm, likely a renal stone.  Bladder:  Appears normal for degree of bladder distention.  Other:  None.  IMPRESSION: 1.  Probable 7 mm nonobstructing calculus in the left kidney.  2.  No hydronephrosis.   Electronically Signed By: Emmaline Kluver M.D. On: 08/12/2021 19:57  No results found for this or any previous visit.  No results found for this or any previous visit.  Results for orders placed during the hospital encounter of 02/01/21  CT RENAL STONE STUDY  Narrative CLINICAL DATA:  Left upper quadrant and left flank pain with hematuria for 3 weeks.  EXAM: CT ABDOMEN AND PELVIS WITHOUT CONTRAST  TECHNIQUE: Multidetector CT imaging of the abdomen and pelvis was performed following the standard protocol without IV contrast.  COMPARISON:  10/31/2020 CT abdomen/pelvis.  FINDINGS: Lower chest: No significant pulmonary nodules or acute consolidative airspace disease.  Hepatobiliary: Normal liver size. No liver mass. Cholecystectomy. No biliary ductal dilatation.  Pancreas: Normal, with no mass or duct dilation.  Spleen: Normal size. No mass.  Adrenals/Urinary Tract: Left adrenal 1.1 cm nodule with density 17 HU, stable since 04/04/2020 CT, compatible with an adenoma. Normal right adrenal. Nonobstructing 6 mm and 1 mm lower right renal stones. Multiple (at least 6) nonobstructing left renal stones, largest 4 mm in the interpolar left kidney. Mild fullness of the right renal collecting system without overt hydronephrosis, unchanged from prior CT. No left hydronephrosis. No contour deforming renal masses. Normal caliber ureters. No ureteral stones. Normal bladder.  Stomach/Bowel: Normal  non-distended stomach. Normal caliber small bowel with no small bowel wall thickening. Normal appendix. Normal large bowel with no diverticulosis, large bowel wall thickening or pericolonic fat stranding.  Vascular/Lymphatic: Mildly atherosclerotic nonaneurysmal abdominal aorta. No pathologically enlarged lymph nodes in the abdomen or pelvis.  Reproductive: Grossly normal uterus.  No adnexal mass.  Other: No pneumoperitoneum, ascites or focal fluid collection.  Musculoskeletal: No aggressive appearing focal osseous lesions. Chronic bilateral L5 pars defects. Moderate thoracolumbar spondylosis.  IMPRESSION: 1. Nonobstructing bilateral nephrolithiasis. No overt hydronephrosis. No ureteral or bladder stones. 2. Stable left adrenal adenoma. 3. Chronic bilateral L5 pars defects. 4. Aortic Atherosclerosis (ICD10-I70.0).   Electronically Signed By: Delbert Phenix M.D. On: 02/01/2021 13:06   Assessment & Plan:    1. Kidney stones -Continue urocitK BID -RTC 3 months with a renal US   No follow-ups on file.  Wilkie Aye, MD  [facility] [place]

## 2021-08-23 NOTE — Patient Instructions (Signed)
Dietary Guidelines to Help Prevent Kidney Stones Kidney stones are deposits of minerals and salts that form inside your kidneys. Your risk of developing kidney stones may be greater depending on your diet, your lifestyle, the medicines you take, and whether you have certain medical conditions. Most people can lower their chances of developing kidney stones by following the instructions below. Your dietitian may give you more specific instructions depending on your overall health and the type of kidney stones you tend to develop. What are tips for following this plan? Reading food labels  Choose foods with "no salt added" or "low-salt" labels. Limit your salt (sodium) intake to less than 1,500 mg a day. Choose foods with calcium for each meal and snack. Try to eat about 300 mg of calcium at each meal. Foods that contain 200-500 mg of calcium a serving include: 8 oz (237 mL) of milk, calcium-fortifiednon-dairy milk, and calcium-fortifiedfruit juice. Calcium-fortified means that calcium has been added to these drinks. 8 oz (237 mL) of kefir, yogurt, and soy yogurt. 4 oz (114 g) of tofu. 1 oz (28 g) of cheese. 1 cup (150 g) of dried figs. 1 cup (91 g) of cooked broccoli. One 3 oz (85 g) can of sardines or mackerel. Most people need 1,000-1,500 mg of calcium a day. Talk to your dietitian about how much calcium is recommended for you. Shopping Buy plenty of fresh fruits and vegetables. Most people do not need to avoid fruits and vegetables, even if these foods contain nutrients that may contribute to kidney stones. When shopping for convenience foods, choose: Whole pieces of fruit. Pre-made salads with dressing on the side. Low-fat fruit and yogurt smoothies. Avoid buying frozen meals or prepared deli foods. These can be high in sodium. Look for foods with live cultures, such as yogurt and kefir. Choose high-fiber grains, such as whole-wheat breads, oat bran, and wheat cereals. Cooking Do not add  salt to food when cooking. Place a salt shaker on the table and allow each person to add his or her own salt to taste. Use vegetable protein, such as beans, textured vegetable protein (TVP), or tofu, instead of meat in pasta, casseroles, and soups. Meal planning Eat less salt, if told by your dietitian. To do this: Avoid eating processed or pre-made food. Avoid eating fast food. Eat less animal protein, including cheese, meat, poultry, or fish, if told by your dietitian. To do this: Limit the number of times you have meat, poultry, fish, or cheese each week. Eat a diet free of meat at least 2 days a week. Eat only one serving each day of meat, poultry, fish, or seafood. When you prepare animal protein, cut pieces into small portion sizes. For most meat and fish, one serving is about the size of the palm of your hand. Eat at least five servings of fresh fruits and vegetables each day. To do this: Keep fruits and vegetables on hand for snacks. Eat one piece of fruit or a handful of berries with breakfast. Have a salad and fruit at lunch. Have two kinds of vegetables at dinner. Limit foods that are high in a substance called oxalate. These include: Spinach (cooked), rhubarb, beets, sweet potatoes, and Swiss chard. Peanuts. Potato chips, french fries, and baked potatoes with skin on. Nuts and nut products. Chocolate. If you regularly take a diuretic medicine, make sure to eat at least 1 or 2 servings of fruits or vegetables that are high in potassium each day. These include: Avocado. Banana. Orange, prune,   carrot, or tomato juice. Baked potato. Cabbage. Beans and split peas. Lifestyle  Drink enough fluid to keep your urine pale yellow. This is the most important thing you can do. Spread your fluid intake throughout the day. If you drink alcohol: Limit how much you use to: 0-1 drink a day for women who are not pregnant. 0-2 drinks a day for men. Be aware of how much alcohol is in your  drink. In the [place] one drink equals one 12 oz bottle of beer (355 mL), one 5 oz glass of wine (148 mL), or one 1 oz glass of hard liquor (44 mL). Lose weight if told by your health care provider. Work with your dietitian to find an eating plan and weight loss strategies that work best for you. General information Talk to your health care provider and dietitian about taking daily supplements. You may be told the following depending on your health and the cause of your kidney stones: Not to take supplements with vitamin C. To take a calcium supplement. To take a daily probiotic supplement. To take other supplements such as magnesium, fish oil, or vitamin B6. Take over-the-counter and prescription medicines only as told by your health care provider. These include supplements. What foods should I limit? Limit your intake of the following foods, or eat them as told by your dietitian. Vegetables Spinach. Rhubarb. Beets. Canned vegetables. Pickles. Olives. Baked potatoes with skin. Grains Wheat bran. Baked goods. Salted crackers. Cereals high in sugar. Meats and other proteins Nuts. Nut butters. Large portions of meat, poultry, or fish. Salted, precooked, or cured meats, such as sausages, meat loaves, and hot dogs. Dairy Cheese. Beverages Regular soft drinks. Regular vegetable juice. Seasonings and condiments Seasoning blends with salt. Salad dressings. Soy sauce. Ketchup. Barbecue sauce. Other foods Canned soups. Canned pasta sauce. Casseroles. Pizza. Lasagna. Frozen meals. Potato chips. French fries. The items listed above may not be a complete list of foods and beverages you should limit. Contact a dietitian for more information. What foods should I avoid? Talk to your dietitian about specific foods you should avoid based on the type of kidney stones you have and your overall health. Fruits Grapefruit. The item listed above may not be a complete list of foods and beverages you should  avoid. Contact a dietitian for more information. Summary Kidney stones are deposits of minerals and salts that form inside your kidneys. You can lower your risk of kidney stones by making changes to your diet. The most important thing you can do is drink enough fluid. Drink enough fluid to keep your urine pale yellow. Talk to your dietitian about how much calcium you should have each day, and eat less salt and animal protein as told by your dietitian. This information is not intended to replace advice given to you by your health care provider. Make sure you discuss any questions you have with your health care provider. Document Revised: 11/19/2019 Document Reviewed: 11/19/2019 Elsevier Patient Education  2022 [employer]  

## 2021-08-24 ENCOUNTER — Other Ambulatory Visit (HOSPITAL_COMMUNITY): Payer: Self-pay

## 2021-08-24 MED ORDER — LORAZEPAM 0.5 MG PO TABS
0.5000 mg | ORAL_TABLET | Freq: Every day | ORAL | 2 refills | Status: DC | PRN
  Filled 2021-08-24 – 2021-09-21 (×2): qty 30, 30d supply, fill #0

## 2021-08-24 MED ORDER — INSULIN LISPRO 100 UNIT/ML IJ SOLN
80.0000 [IU] | INTRAMUSCULAR | 0 refills | Status: DC
  Filled 2021-08-24: qty 40, 50d supply, fill #0

## 2021-08-30 ENCOUNTER — Other Ambulatory Visit: Payer: Self-pay

## 2021-08-30 ENCOUNTER — Ambulatory Visit (INDEPENDENT_AMBULATORY_CARE_PROVIDER_SITE_OTHER): Payer: No Typology Code available for payment source | Admitting: Nurse Practitioner

## 2021-08-30 ENCOUNTER — Other Ambulatory Visit (HOSPITAL_COMMUNITY): Payer: Self-pay

## 2021-08-30 ENCOUNTER — Encounter: Payer: Self-pay | Admitting: Nurse Practitioner

## 2021-08-30 VITALS — BP 128/71 | HR 85 | Temp 97.9°F | Ht <= 58 in | Wt 228.0 lb

## 2021-08-30 DIAGNOSIS — Z139 Encounter for screening, unspecified: Secondary | ICD-10-CM

## 2021-08-30 DIAGNOSIS — I10 Essential (primary) hypertension: Secondary | ICD-10-CM | POA: Diagnosis not present

## 2021-08-30 DIAGNOSIS — Z23 Encounter for immunization: Secondary | ICD-10-CM | POA: Diagnosis not present

## 2021-08-30 DIAGNOSIS — G4733 Obstructive sleep apnea (adult) (pediatric): Secondary | ICD-10-CM

## 2021-08-30 DIAGNOSIS — R059 Cough, unspecified: Secondary | ICD-10-CM | POA: Insufficient documentation

## 2021-08-30 DIAGNOSIS — Z7689 Persons encountering health services in other specified circumstances: Secondary | ICD-10-CM

## 2021-08-30 DIAGNOSIS — E785 Hyperlipidemia, unspecified: Secondary | ICD-10-CM

## 2021-08-30 DIAGNOSIS — E1165 Type 2 diabetes mellitus with hyperglycemia: Secondary | ICD-10-CM

## 2021-08-30 MED ORDER — FAMOTIDINE 20 MG PO TABS: 20.0000 mg | ORAL_TABLET | Freq: Two times a day (BID) | ORAL | 0 refills | Status: DC

## 2021-08-30 MED ORDER — OLMESARTAN MEDOXOMIL 20 MG PO TABS: 10.0000 mg | ORAL_TABLET | Freq: Every day | ORAL | 3 refills | Status: DC

## 2021-08-30 NOTE — Progress Notes (Signed)
New Patient Office Visit  Subjective:  Patient ID: [name], female    DOB: Nov 01, 1966  Age: 55 y.o. MRN: [id number]  CC:  Chief Complaint  Patient presents with   New Patient (Initial Visit)    Here to establish care. Complains of Covid cough she's had since having Covid again in April. Sees pulmonology Oct 18 but would like to see if she could get in sooner.    HPI [name] presents for new patient visit. Transferring care from Wende Neighbors, MD. Last physical was over a year ago. Last labs was over 3 months ago.  She had COVID in April, and she has had a cough that has been ongoing since then. She has trouble sleeping d/t cough.  Her cough is worse when she lays down at night or when she is sitting down. She states sometimes she has tasted metal, but that is very infrequent.  She has sleep apnea and had a sleep study at the hospital several years ago, but she never got a CPAP machine.   Past Medical History:  Diagnosis Date   Diabetes mellitus    insulin pump 2013   Hypercholesteremia    Hypertension    Incarcerated umbilical hernia 3/38/2505   Kidney stone    Kidney stones 08/31/2008   Qualifier: Diagnosis of  By: Jonna Munro MD, Cornelius     Neuropathy    Obesity    Obstructive sleep apnea    Palpitations    Shortness of breath     Past Surgical History:  Procedure Laterality Date   BIOPSY  06/23/2020   Procedure: BIOPSY;  Surgeon: Daneil Dolin, MD;  [facility];  Service: Endoscopy;;   CARDIAC CATHETERIZATION  12/11/2012   normal coronary arteries   CESAREAN SECTION  1994;2000   [facility]   CHOLECYSTECTOMY  1992   COLONOSCOPY WITH PROPOFOL N/A 06/23/2020   large amount of stool in entire colon, prep inadequate.    CYSTOSCOPY W/ URETERAL STENT PLACEMENT  05/08/2012   Procedure: CYSTOSCOPY WITH RETROGRADE PYELOGRAM/URETERAL STENT PLACEMENT;  Surgeon: Marissa Nestle, MD;  Location: [facility];  Service: Urology;  Laterality: Right;   CYSTOSCOPY  WITH RETROGRADE PYELOGRAM, URETEROSCOPY AND STENT PLACEMENT Right 05/04/2020   Procedure: CYSTOSCOPY WITH RIGHT RETROGRADE PYELOGRAM, RIGHT URETEROSCOPY AND RIGHT URETERAL STENT EXCHANGE;  Surgeon: Cleon Gustin, MD;  Location: [facility];  Service: Urology;  Laterality: Right;   CYSTOSCOPY WITH RETROGRADE PYELOGRAM, URETEROSCOPY AND STENT PLACEMENT Left 08/08/2020   Procedure: CYSTOSCOPY WITH RETROGRADE PYELOGRAM, URETEROSCOPY WITH LASER  AND STENT PLACEMENT;  Surgeon: Cleon Gustin, MD;  Location: [facility];  Service: Urology;  Laterality: Left;   CYSTOSCOPY WITH RETROGRADE PYELOGRAM, URETEROSCOPY AND STENT PLACEMENT Right 11/08/2020   Procedure: CYSTOSCOPY WITH RIGHT RETROGRADE PYELOGRAM, RIGHT URETEROSCOPY AND STENT PLACEMENT;  Surgeon: Cleon Gustin, MD;  Location: [facility];  Service: Urology;  Laterality: Right;   CYSTOSCOPY WITH RETROGRADE PYELOGRAM, URETEROSCOPY AND STENT PLACEMENT Left 12/27/2020   Procedure: CYSTOSCOPY WITH LEFT RETROGRADE PYELOGRAM, LEFT URETEROSCOPY AND LEFT URETERAL STENT PLACEMENT;  Surgeon: Cleon Gustin, MD;  Location: [facility];  Service: Urology;  Laterality: Left;   CYSTOSCOPY WITH RETROGRADE PYELOGRAM, URETEROSCOPY AND STENT PLACEMENT Bilateral 03/16/2021   Procedure: CYSTOSCOPY WITH BILATERAL  RETROGRADE PYELOGRAM, BILATERAL URETEROSCOPY AND STENT PLACEMENT ON THE LEFT;  Surgeon: Cleon Gustin, MD;  Location: [facility];  Service: Urology;  Laterality: Bilateral;   CYSTOSCOPY WITH STENT PLACEMENT Right 02/25/2020   Procedure: CYSTOSCOPY WITH RIGHT  RETROGRADE RIGHT STENT PLACEMENT;  Surgeon: Festus Aloe, MD;  Location: [facility];  Service: Urology;  Laterality: Right;   ESOPHAGOGASTRODUODENOSCOPY (EGD) WITH PROPOFOL N/A 06/23/2020   Normal esophagus, multiple localized erosions were found in gastric antrum s/p biopsy, normal duodenum. Empiric dilation.Reactive gastropathy with erosions, negative H.pylori, no dysplasia.     EXTRACORPOREAL SHOCK WAVE  LITHOTRIPSY Right 10/11/2020   Procedure: EXTRACORPOREAL SHOCK WAVE LITHOTRIPSY (ESWL);  Surgeon: Cleon Gustin, MD;  Location: [facility];  Service: Urology;  Laterality: Right;   FOOT SURGERY Right March, 2013   [facility]-removal of bone spur   HOLMIUM LASER APPLICATION  9/56/2130   Procedure: HOLMIUM LASER LITHOTRIPSY RIGHT URETERAL CALCULUS;  Surgeon: Cleon Gustin, MD;  Location: [facility];  Service: Urology;;   HYSTEROSCOPY WITH THERMACHOICE  04/25/2012   Procedure: HYSTEROSCOPY WITH THERMACHOICE;  Surgeon: Florian Buff, MD;  Location: [facility];  Service: Gynecology;  Laterality: N/A;  total therapy time= 11 minutes 42 seconds; 34 ml D5w  in and 34 ml D5w out; temp =87 degrees F   LEFT HEART CATHETERIZATION WITH CORONARY ANGIOGRAM N/A 12/11/2012   Procedure: LEFT HEART CATHETERIZATION WITH CORONARY ANGIOGRAM;  Surgeon: Lorretta Harp, MD;  Location: [facility] CATH [facility];  Service: Cardiovascular;  Laterality: N/A;   MALONEY DILATION N/A 06/23/2020   Procedure: Venia Minks DILATION;  Surgeon: Daneil Dolin, MD;  [facility];  Service: Endoscopy;  Laterality: N/A;   Sinus sergery  1988   [place]   STONE EXTRACTION WITH BASKET Right 11/08/2020   Procedure: STONE EXTRACTION WITH BASKET;  Surgeon: Cleon Gustin, MD;  Location: [facility];  Service: Urology;  Laterality: Right;   UMBILICAL HERNIA REPAIR N/A 03/25/2014   Procedure: UMBILICAL HERNIA REPAIR;  Surgeon: Scherry Ran, MD;  Location: [facility];  Service: General;  Laterality: N/A;   uterine ablation      Family History  Problem Relation Age of Onset   Cancer Mother    Diabetes Mother    Cancer Paternal Uncle    Depression Paternal Grandmother    Depression Paternal Grandfather    Heart disease Other    Cancer Other    Diabetes Other    Achalasia Other    Anesthesia problems Neg Hx    Hypotension Neg Hx    Malignant hyperthermia Neg Hx    Pseudochol deficiency Neg Hx     Social History   Socioeconomic  History   Marital status: Married    Spouse name: Not on file   Number of children: 2   Years of education: college   Highest education level: Not on file  Occupational History   Occupation: CNA    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: [employer]   Occupation: Quarry manager- at [employer]  Tobacco Use   Smoking status: Never   Smokeless tobacco: Never  Vaping Use   Vaping Use: Never used  Substance and Sexual Activity   Alcohol use: No   Drug use: No   Sexual activity: Yes    Partners: Male    Comment: ablation  Other Topics Concern   Not on file  Social History Narrative   Regular exercise: walks Caffeine use:    Social Determinants of Health   Financial Resource Strain: Not on file  Food Insecurity: Not on file  Transportation Needs: Not on file  Physical Activity: Not on file  Stress: Not on file  Social Connections: Not on file  Intimate Partner Violence: Not on file  ROS Review of Systems  Constitutional: Negative.   Respiratory:  Positive for cough. Negative for shortness of breath and wheezing.   Cardiovascular: Negative.   Musculoskeletal: Negative.   Psychiatric/Behavioral: Negative.     Objective:   Today's Vitals: BP 128/71 (BP Location: Left Arm, Patient Position: Sitting, Cuff Size: Large)   Pulse 85   Temp 97.9 F (36.6 C) (Oral)   Ht 4' 9"  (1.448 m)   Wt 228 lb (103.4 kg)   LMP 07/16/2012   SpO2 95%   BMI 49.34 kg/m   Physical Exam Constitutional:      Appearance: Normal appearance. She is obese.  Cardiovascular:     Rate and Rhythm: Normal rate and regular rhythm.     Pulses: Normal pulses.     Heart sounds: Normal heart sounds.  Pulmonary:     Effort: Pulmonary effort is normal.     Breath sounds: Normal breath sounds.  Musculoskeletal:        General: Normal range of motion.  Neurological:     Mental Status: She is alert.  Psychiatric:        Mood and Affect: Mood normal.        Behavior: Behavior normal.        Thought  Content: Thought content normal.        Judgment: Judgment normal.    Assessment & Plan:   Problem List Items Addressed This Visit       Cardiovascular and Mediastinum   Hypertension    BP Readings from Last 3 Encounters:  08/30/21 128/71  08/12/21 138/78  06/24/21 (!) 141/72  -well controlled today      Relevant Medications   Furosemide (LASIX PO)   olmesartan (BENICAR) 20 MG tablet   Other Relevant Orders   CBC with Differential/Platelet   CMP14+EGFR   Lipid Panel With LDL/HDL Ratio     Endocrine   Diabetes mellitus type II, uncontrolled (HCC)    -checking A1c and microalbumin -she is on statin and ACEi -stopping lisinopril d/t cough -Rx. olmesartan      Relevant Medications   olmesartan (BENICAR) 20 MG tablet   Other Relevant Orders   Hemoglobin A1c   Microalbumin / creatinine urine ratio     Other   HLD (hyperlipidemia)    -checking lipid panel      Relevant Medications   Furosemide (LASIX PO)   olmesartan (BENICAR) 20 MG tablet   Cough    -started after COVID that she contracted in April 2022 -lungs sound great today, but she definitely has a cough -had previous CXR that was negative- results reviewed today -taking PPI, but Rx. pepcid in case GERD is causing cough -may be caused by lisinopril; has been taking this for about a year -STOP lisinopril -Rx. olmesartan      Relevant Medications   famotidine (PEPCID) 20 MG tablet   olmesartan (BENICAR) 20 MG tablet   Other Visit Diagnoses     Establishing care with new doctor, encounter for    -  Primary   Relevant Orders   Hepatitis C Antibody   CBC with Differential/Platelet   CMP14+EGFR   Lipid Panel With LDL/HDL Ratio   Hemoglobin A1c   Obstructive sleep apnea syndrome       Relevant Orders   Home sleep test   Screening due       Relevant Orders   Hepatitis C Antibody       Outpatient Encounter Medications as of 08/30/2021  Medication Sig  albuterol (VENTOLIN HFA) 108 (90 Base)  MCG/ACT inhaler Inhale 2 puffs into the lungs every 6 (six) hours as needed for wheezing or shortness of breath.    ARIPiprazole (ABILIFY) 2 MG tablet Take 2 mg by mouth at bedtime.    buPROPion (WELLBUTRIN XL) 150 MG 24 hr tablet Take 150 mg by mouth daily.    desvenlafaxine (PRISTIQ) 50 MG 24 hr tablet Take 50 mg by mouth daily.   famotidine (PEPCID) 20 MG tablet Take 1 tablet (20 mg total) by mouth 2 (two) times daily.   Furosemide (LASIX PO) Take 1 tablet by mouth 2 (two) times daily.   gabapentin (NEURONTIN) 600 MG tablet Take 600 mg by mouth 3 (three) times daily.   HUMALOG 100 UNIT/ML injection Inject 3.5 Units into the skin continuous. 3.5 units every 1 hr-insulin pump   hydrocortisone 2.5 % cream APPLY A THIN LAYER TO THE AFFECTED AREA(S) BY TOPICAL ROUTE 2 TIMES PER DAY   ibuprofen (ADVIL) 800 MG tablet Take 1 tablet (800 mg total) by mouth 3 (three) times daily.   insulin lispro (HUMALOG) 100 UNIT/ML injection Inject 80 units under the skin via omnipod daily   LORazepam (ATIVAN) 0.5 MG tablet Take 1 tablet (0.5 mg total) by mouth daily as needed for anxiety   methocarbamol (ROBAXIN) 500 MG tablet Take 1 tablet (500 mg total) by mouth at bedtime as needed for muscle spasms.   olmesartan (BENICAR) 20 MG tablet Take 0.5 tablets (10 mg total) by mouth daily.   omeprazole (PRILOSEC) 40 MG capsule Take 40 mg by mouth daily.   ondansetron (ZOFRAN) 4 MG tablet Take 1 tablet (4 mg total) by mouth every 8 (eight) hours as needed.   oxybutynin (DITROPAN) 5 MG tablet Take 5 mg by mouth at bedtime.    Potassium 99 MG TABS Take 99 mg by mouth daily.   simvastatin (ZOCOR) 40 MG tablet Take 40 mg by mouth daily.    [DISCONTINUED] ALPRAZolam (XANAX) 1 MG tablet Take 1 mg by mouth at bedtime.   [DISCONTINUED] lisinopril (ZESTRIL) 10 MG tablet Take 10 mg by mouth daily.   [DISCONTINUED] pantoprazole (PROTONIX) 20 MG tablet Take 20 mg by mouth 2 (two) times daily.   Fluticasone-Salmeterol,sensor,  (AIRDUO DIGIHALER) 113-14 MCG/ACT AEPB Inhale 1 puff into the lungs 2 (two) times daily. (Patient not taking: Reported on 08/30/2021)   linaclotide (LINZESS) 145 MCG CAPS capsule Take 1 capsule (145 mcg total) by mouth daily before breakfast. (Patient not taking: Reported on 08/30/2021)   [DISCONTINUED] albuterol (VENTOLIN HFA) 108 (90 Base) MCG/ACT inhaler Inhale 2 puffs into the lungs every 4 (four) hours as needed. (Patient not taking: Reported on 08/30/2021)   [DISCONTINUED] benzonatate (TESSALON) 100 MG capsule Take 1 capsule (100 mg total) by mouth 3 (three) times daily as needed for cough.   [DISCONTINUED] buPROPion (WELLBUTRIN XL) 150 MG 24 hr tablet Take 1 tablet (150 mg total) by mouth once daily in the morning. (Patient not taking: Reported on 08/30/2021)   [DISCONTINUED] desvenlafaxine (PRISTIQ) 50 MG 24 hr tablet Take 1 tablet (50 mg total) by mouth daily. (Patient not taking: Reported on 08/30/2021)   [DISCONTINUED] gabapentin (NEURONTIN) 600 MG tablet Take 1 tablet (600 mg total) by mouth 3 (three) times daily. (Patient not taking: Reported on 08/30/2021)   [DISCONTINUED] gabapentin (NEURONTIN) 600 MG tablet TAKE 1 TABLET BY MOUTH THREE TIMES DAILY (Patient not taking: Reported on 08/30/2021)   [DISCONTINUED] lisinopril (ZESTRIL) 10 MG tablet Take 10 mg by mouth daily. (Patient  not taking: Reported on 08/30/2021)   [DISCONTINUED] lisinopril-hydrochlorothiazide (ZESTORETIC) 20-12.5 MG tablet Take 1 tablet by mouth daily. (Patient not taking: Reported on 08/30/2021)   [DISCONTINUED] lisinopril-hydrochlorothiazide (ZESTORETIC) 20-12.5 MG tablet TAKE 1 TABLET BY MOUTH EVERY DAY (Patient not taking: Reported on 08/30/2021)   [DISCONTINUED] oxybutynin (DITROPAN) 5 MG tablet Take 1 tablet (5 mg total) by mouth daily. (Patient not taking: Reported on 08/30/2021)   [DISCONTINUED] oxyCODONE-acetaminophen (PERCOCET) 5-325 MG tablet Take 1 tablet by mouth every 4 (four) hours as needed for moderate pain or  severe pain. (Patient not taking: Reported on 08/30/2021)   [DISCONTINUED] pantoprazole (PROTONIX) 40 MG tablet Take 1 tablet (40 mg total) by mouth 2 (two) times daily. (Patient not taking: Reported on 08/30/2021)   [DISCONTINUED] phentermine (ADIPEX-P) 37.5 MG tablet Take 37.5 mg by mouth daily before breakfast. (Patient not taking: Reported on 08/30/2021)   [DISCONTINUED] phentermine (ADIPEX-P) 37.5 MG tablet Take 1 tablet (37.5 mg total) by mouth every other day. (Patient not taking: Reported on 08/30/2021)   [DISCONTINUED] Potassium Citrate (UROCIT-K 15) 15 MEQ (1620 MG) TBCR Take 1 tablet by mouth in the morning and at bedtime. (Patient not taking: Reported on 08/30/2021)   [DISCONTINUED] predniSONE (STERAPRED UNI-PAK 21 TAB) 10 MG (21) TBPK tablet Take as directed on package   [DISCONTINUED] simvastatin (ZOCOR) 40 MG tablet Take 1 tablet (40 mg total) by mouth every evening for cholesterol (Patient not taking: Reported on 08/30/2021)   No facility-administered encounter medications on file as of 08/30/2021.    Follow-up: Return in about 3 months (around 11/29/2021) for Physical Exam (same-day fasting labs).   [name], NP

## 2021-08-30 NOTE — Assessment & Plan Note (Signed)
BP Readings from Last 3 Encounters:  08/30/21 128/71  08/12/21 138/78  06/24/21 (!) 141/72   -well controlled today

## 2021-08-30 NOTE — Assessment & Plan Note (Signed)
-  checking lipid panel 

## 2021-08-30 NOTE — Patient Instructions (Addendum)
Please have fasting labs drawn 2-3 days prior to your appointment so we can discuss the results during your office visit.  STOP lisinopril today You can start the olmesartan tomorrow for BP/kidney protection.

## 2021-08-30 NOTE — Assessment & Plan Note (Signed)
-  checking A1c and microalbumin -she is on statin and ACEi -stopping lisinopril d/t cough -Rx. olmesartan

## 2021-08-30 NOTE — Assessment & Plan Note (Addendum)
-  started after COVID that she contracted in April 2022 -lungs sound great today, but she definitely has a cough -had previous CXR that was negative- results reviewed today -taking PPI, but Rx. pepcid in case GERD is causing cough -may be caused by lisinopril; has been taking this for about a year -STOP lisinopril -Rx. olmesartan

## 2021-08-31 ENCOUNTER — Other Ambulatory Visit: Payer: Self-pay | Admitting: Nurse Practitioner

## 2021-08-31 ENCOUNTER — Other Ambulatory Visit (HOSPITAL_COMMUNITY): Payer: Self-pay

## 2021-08-31 ENCOUNTER — Other Ambulatory Visit: Payer: Self-pay

## 2021-08-31 DIAGNOSIS — E1165 Type 2 diabetes mellitus with hyperglycemia: Secondary | ICD-10-CM

## 2021-08-31 MED ORDER — TIRZEPATIDE 2.5 MG/0.5ML ~~LOC~~ SOAJ
2.5000 mg | SUBCUTANEOUS | 0 refills | Status: DC
Start: 2021-08-31 — End: 2021-11-29

## 2021-08-31 MED ORDER — BLOOD GLUCOSE METER KIT
PACK | 0 refills | Status: AC
Start: 2021-08-31 — End: ?
  Filled 2021-08-31: qty 1, 1d supply, fill #0

## 2021-08-31 MED ORDER — GLUCOSE BLOOD VI STRP
ORAL_STRIP | 0 refills | Status: DC
  Filled 2021-08-31: qty 100, 25d supply, fill #0

## 2021-08-31 MED ORDER — FREESTYLE LANCETS MISC
0 refills | Status: AC
  Filled 2021-08-31: qty 100, 25d supply, fill #0

## 2021-08-31 MED ORDER — TIRZEPATIDE 5 MG/0.5ML ~~LOC~~ SOAJ
5.0000 mg | SUBCUTANEOUS | 0 refills | Status: DC
Start: 2021-09-28 — End: 2021-11-24

## 2021-08-31 NOTE — Progress Notes (Signed)
A1c is significantly elevated at 10.2. We want this under 7. I sent in mounjaro, which is a new medication. I printed off a saving coupon for you, so you should be able to get it for around $25. It is a once per week injectable. Please have the pharmacist show you how to use the first dose.

## 2021-09-01 ENCOUNTER — Telehealth: Payer: Self-pay

## 2021-09-01 ENCOUNTER — Other Ambulatory Visit (HOSPITAL_COMMUNITY): Payer: Self-pay

## 2021-09-01 LAB — CBC WITH DIFFERENTIAL/PLATELET
Basophils Absolute: 0.1 10*3/uL (ref 0.0–0.2)
Basos: 1 %
EOS (ABSOLUTE): 0.1 10*3/uL (ref 0.0–0.4)
Eos: 1 %
Hematocrit: 39.3 % (ref 34.0–46.6)
Hemoglobin: 13.6 g/dL (ref 11.1–15.9)
Immature Grans (Abs): 0 10*3/uL (ref 0.0–0.1)
Immature Granulocytes: 0 %
Lymphocytes Absolute: 3 10*3/uL (ref 0.7–3.1)
Lymphs: 29 %
MCH: 28.5 pg (ref 26.6–33.0)
MCHC: 34.6 g/dL (ref 31.5–35.7)
MCV: 82 fL (ref 79–97)
Monocytes Absolute: 0.8 10*3/uL (ref 0.1–0.9)
Monocytes: 8 %
Neutrophils Absolute: 6.5 10*3/uL (ref 1.4–7.0)
Neutrophils: 61 %
Platelets: 356 10*3/uL (ref 150–450)
RBC: 4.78 x10E6/uL (ref 3.77–5.28)
RDW: 13.4 % (ref 11.7–15.4)
WBC: 10.5 10*3/uL (ref 3.4–10.8)

## 2021-09-01 LAB — CMP14+EGFR
ALT: 31 IU/L (ref 0–32)
AST: 22 IU/L (ref 0–40)
Albumin/Globulin Ratio: 1.8 (ref 1.2–2.2)
Albumin: 4.3 g/dL (ref 3.8–4.9)
Alkaline Phosphatase: 143 IU/L — ABNORMAL HIGH (ref 44–121)
BUN/Creatinine Ratio: 19 (ref 9–23)
BUN: 14 mg/dL (ref 6–24)
Bilirubin Total: 0.6 mg/dL (ref 0.0–1.2)
CO2: 26 mmol/L (ref 20–29)
Calcium: 9.4 mg/dL (ref 8.7–10.2)
Chloride: 95 mmol/L — ABNORMAL LOW (ref 96–106)
Creatinine, Ser: 0.73 mg/dL (ref 0.57–1.00)
Globulin, Total: 2.4 g/dL (ref 1.5–4.5)
Glucose: 225 mg/dL — ABNORMAL HIGH (ref 65–99)
Potassium: 4.6 mmol/L (ref 3.5–5.2)
Sodium: 136 mmol/L (ref 134–144)
Total Protein: 6.7 g/dL (ref 6.0–8.5)
eGFR: 97 mL/min/{1.73_m2} (ref >59)

## 2021-09-01 LAB — LIPID PANEL WITH LDL/HDL RATIO
Cholesterol, Total: 186 mg/dL (ref 100–199)
HDL: 40 mg/dL (ref >39)
LDL Chol Calc (NIH): 113 mg/dL — ABNORMAL HIGH (ref 0–99)
LDL/HDL Ratio: 2.8 ratio (ref 0.0–3.2)
Triglycerides: 190 mg/dL — ABNORMAL HIGH (ref 0–149)
VLDL Cholesterol Cal: 33 mg/dL (ref 5–40)

## 2021-09-01 LAB — HEMOGLOBIN A1C
Est. average glucose Bld gHb Est-mCnc: 246 mg/dL
Hgb A1c MFr Bld: 10.2 % — ABNORMAL HIGH (ref 4.8–5.6)

## 2021-09-01 LAB — MICROALBUMIN / CREATININE URINE RATIO
Creatinine, Urine: 147.8 mg/dL
Microalb/Creat Ratio: 7 mg/g creat (ref 0–29)
Microalbumin, Urine: 10.3 ug/mL

## 2021-09-01 LAB — HEPATITIS C ANTIBODY: Hep C Virus Ab: <0.1 s/co ratio (ref 0.0–0.9)

## 2021-09-01 NOTE — Telephone Encounter (Signed)
Patient said [facility] needs prior authorization on couple of medicine that was called in yesterday, patient did not know name of medication.

## 2021-09-01 NOTE — Telephone Encounter (Signed)
Left message for pt

## 2021-09-01 NOTE — Telephone Encounter (Signed)
Patient returning call back has not received medicine yet, gong out of town today. Patient call back # [phone].

## 2021-09-01 NOTE — Telephone Encounter (Signed)
I spoke with pt and she is waiting on prior authorization for [name] however she did not realize she can use the coupon that was provided and pay $25 OOP without running it through insurance. She is going to call and talk with the pharmacy at this time.

## 2021-09-06 ENCOUNTER — Ambulatory Visit: Payer: Self-pay | Admitting: Gastroenterology

## 2021-09-11 ENCOUNTER — Telehealth: Payer: Self-pay

## 2021-09-11 NOTE — Telephone Encounter (Signed)
Patient call this am with new onset flank pain. Message sent to MD. History of kidney stones.

## 2021-09-12 NOTE — Telephone Encounter (Signed)
Can we have the renal u/s for this patient scheduled sooner please?

## 2021-09-19 NOTE — Telephone Encounter (Signed)
Patient called and made aware to get Renal US sooner than originally scheduled. Patient has not heard from [facility].

## 2021-09-20 ENCOUNTER — Encounter (HOSPITAL_COMMUNITY): Payer: Self-pay | Admitting: Physical Therapy

## 2021-09-20 ENCOUNTER — Other Ambulatory Visit: Payer: Self-pay

## 2021-09-20 ENCOUNTER — Ambulatory Visit (HOSPITAL_COMMUNITY): Payer: No Typology Code available for payment source | Attending: Nurse Practitioner | Admitting: Physical Therapy

## 2021-09-20 DIAGNOSIS — M5441 Lumbago with sciatica, right side: Secondary | ICD-10-CM | POA: Insufficient documentation

## 2021-09-20 DIAGNOSIS — R262 Difficulty in walking, not elsewhere classified: Secondary | ICD-10-CM | POA: Diagnosis not present

## 2021-09-20 DIAGNOSIS — G8929 Other chronic pain: Secondary | ICD-10-CM | POA: Insufficient documentation

## 2021-09-20 DIAGNOSIS — M5442 Lumbago with sciatica, left side: Secondary | ICD-10-CM | POA: Insufficient documentation

## 2021-09-20 DIAGNOSIS — M6281 Muscle weakness (generalized): Secondary | ICD-10-CM | POA: Diagnosis present

## 2021-09-20 NOTE — Therapy (Signed)
[facility] [address] [place], [place], [address] Phone: [phone]   Fax:  [phone]  Physical Therapy Evaluation  Patient Details  Name: [name] MRN: [id number] Date of Birth: [birth year] Referring Provider (PT): Jadene Pierini, MD   Encounter Date: 09/20/2021   PT End of Session - 09/20/21 1558     Visit Number 1    Number of Visits 12    Date for PT Re-Evaluation 11/01/21    Authorization Type [place] [facility] no auth or VL    Progress Note Due on Visit 10    PT Start Time 1600    PT Stop Time 1633    PT Time Calculation (min) 33 min    Activity Tolerance Patient limited by pain    Behavior During Therapy [facility] for tasks assessed/performed             Past Medical History:  Diagnosis Date   Diabetes mellitus    insulin pump 2013   Hypercholesteremia    Hypertension    Incarcerated umbilical hernia 03/25/2014   Kidney stone    Kidney stones 08/31/2008   Qualifier: Diagnosis of  By: Erby Pian MD, Cornelius     Neuropathy    Obesity    Obstructive sleep apnea    Palpitations    Shortness of breath     Past Surgical History:  Procedure Laterality Date   BIOPSY  06/23/2020   Procedure: BIOPSY;  Surgeon: Corbin Ade, MD;  [facility];  Service: Endoscopy;;   CARDIAC CATHETERIZATION  12/11/2012   normal coronary arteries   CESAREAN SECTION  1994;2000   [facility]   CHOLECYSTECTOMY  1992   COLONOSCOPY WITH PROPOFOL N/A 06/23/2020   large amount of stool in entire colon, prep inadequate.    CYSTOSCOPY W/ URETERAL STENT PLACEMENT  05/08/2012   Procedure: CYSTOSCOPY WITH RETROGRADE PYELOGRAM/URETERAL STENT PLACEMENT;  Surgeon: Ky Barban, MD;  Location: [facility];  Service: Urology;  Laterality: Right;   CYSTOSCOPY WITH RETROGRADE PYELOGRAM, URETEROSCOPY AND STENT PLACEMENT Right 05/04/2020   Procedure: CYSTOSCOPY WITH RIGHT RETROGRADE PYELOGRAM, RIGHT URETEROSCOPY AND RIGHT URETERAL STENT  EXCHANGE;  Surgeon: Malen Gauze, MD;  Location: [facility];  Service: Urology;  Laterality: Right;   CYSTOSCOPY WITH RETROGRADE PYELOGRAM, URETEROSCOPY AND STENT PLACEMENT Left 08/08/2020   Procedure: CYSTOSCOPY WITH RETROGRADE PYELOGRAM, URETEROSCOPY WITH LASER  AND STENT PLACEMENT;  Surgeon: Malen Gauze, MD;  Location: [facility];  Service: Urology;  Laterality: Left;   CYSTOSCOPY WITH RETROGRADE PYELOGRAM, URETEROSCOPY AND STENT PLACEMENT Right 11/08/2020   Procedure: CYSTOSCOPY WITH RIGHT RETROGRADE PYELOGRAM, RIGHT URETEROSCOPY AND STENT PLACEMENT;  Surgeon: Malen Gauze, MD;  Location: [facility];  Service: Urology;  Laterality: Right;   CYSTOSCOPY WITH RETROGRADE PYELOGRAM, URETEROSCOPY AND STENT PLACEMENT Left 12/27/2020   Procedure: CYSTOSCOPY WITH LEFT RETROGRADE PYELOGRAM, LEFT URETEROSCOPY AND LEFT URETERAL STENT PLACEMENT;  Surgeon: Malen Gauze, MD;  Location: [facility];  Service: Urology;  Laterality: Left;   CYSTOSCOPY WITH RETROGRADE PYELOGRAM, URETEROSCOPY AND STENT PLACEMENT Bilateral 03/16/2021   Procedure: CYSTOSCOPY WITH BILATERAL  RETROGRADE PYELOGRAM, BILATERAL URETEROSCOPY AND STENT PLACEMENT ON THE LEFT;  Surgeon: Malen Gauze, MD;  Location: [facility];  Service: Urology;  Laterality: Bilateral;   CYSTOSCOPY WITH STENT PLACEMENT Right 02/25/2020   Procedure: CYSTOSCOPY WITH RIGHT RETROGRADE RIGHT STENT PLACEMENT;  Surgeon: Jerilee Field, MD;  Location: [facility];  Service: Urology;  Laterality: Right;   ESOPHAGOGASTRODUODENOSCOPY (EGD) WITH PROPOFOL N/A 06/23/2020  Normal esophagus, multiple localized erosions were found in gastric antrum s/p biopsy, normal duodenum. Empiric dilation.Reactive gastropathy with erosions, negative H.pylori, no dysplasia.     EXTRACORPOREAL SHOCK WAVE LITHOTRIPSY Right 10/11/2020   Procedure: EXTRACORPOREAL SHOCK WAVE LITHOTRIPSY (ESWL);  Surgeon: Malen Gauze, MD;  Location: [facility];  Service: Urology;  Laterality: Right;    FOOT SURGERY Right March, 2013   [facility]-removal of bone spur   HOLMIUM LASER APPLICATION  05/04/2020   Procedure: HOLMIUM LASER LITHOTRIPSY RIGHT URETERAL CALCULUS;  Surgeon: Malen Gauze, MD;  Location: [facility];  Service: Urology;;   HYSTEROSCOPY WITH THERMACHOICE  04/25/2012   Procedure: HYSTEROSCOPY WITH THERMACHOICE;  Surgeon: Lazaro Arms, MD;  Location: [facility];  Service: Gynecology;  Laterality: N/A;  total therapy time= 11 minutes 42 seconds; 34 ml D5w  in and 34 ml D5w out; temp =87 degrees F   LEFT HEART CATHETERIZATION WITH CORONARY ANGIOGRAM N/A 12/11/2012   Procedure: LEFT HEART CATHETERIZATION WITH CORONARY ANGIOGRAM;  Surgeon: Runell Gess, MD;  Location: [facility] CATH LAB;  Service: Cardiovascular;  Laterality: N/A;   MALONEY DILATION N/A 06/23/2020   Procedure: [name] DILATION;  Surgeon: Corbin Ade, MD;  [facility];  Service: Endoscopy;  Laterality: N/A;   Sinus sergery  1988   [place]   STONE EXTRACTION WITH BASKET Right 11/08/2020   Procedure: STONE EXTRACTION WITH BASKET;  Surgeon: Malen Gauze, MD;  Location: [facility];  Service: Urology;  Laterality: Right;   UMBILICAL HERNIA REPAIR N/A 03/25/2014   Procedure: UMBILICAL HERNIA REPAIR;  Surgeon: Marlane Hatcher, MD;  Location: [facility];  Service: General;  Laterality: N/A;   uterine ablation      There were no vitals filed for this visit.    Subjective Assessment - 09/20/21 1605     Subjective States that she has back pain and her MD sent her to a neurosurgeon and that MD said that the only thing that will help her is back surgery. Patient requested to have therapy first. States she has had back pain for a long time but is episodic in nature. States that transitioning from sit to stand takes her a moment. States she has pain that radiates down her legs some times. Reports no current symptoms in her legs. States that she currently has 10/10 back pain. States that walking, standing, and  sitting increase her pain. States that laying down helps her symptoms. Reports she has not had any injections. Reports that pain has gotten worse in the last couple of years.    Pertinent History DB with insulin pump, long COVID symptoms, hx of kidney stones    Diagnostic tests xray    Patient Stated Goals to have less pain    Currently in Pain? Yes    Pain Score 10-Worst pain ever    Pain Location Back    Pain Orientation Mid;Lower    Pain Descriptors / Indicators Aching                [facility] PT Assessment - 09/20/21 0001       Assessment   Medical Diagnosis LBP    Referring Provider (PT) Jadene Pierini, MD    Prior Therapy no      Balance Screen   Has the patient fallen in the past 6 months Yes    How many times? 2    Has the patient had a decrease in activity level because of a fear of falling?  Yes  Is the patient reluctant to leave their home because of a fear of falling?  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      Observation/Other Assessments   Focus on Therapeutic Outcomes (FOTO)  35.6% function      ROM / Strength   AROM / PROM / Strength AROM;Strength      AROM   Overall AROM Comments hip ROM gauraded bilaterally and pain - unable to assess    AROM Assessment Site Lumbar    Lumbar Flexion 75% limited stabbing in back    Lumbar Extension 50% limited hitches at L5/S1 and stabbing in back    Lumbar - Right Side Bend 50% limited with pain radiating down ipsilateral leg    Lumbar - Left Side Bend 50% limited with pain radiating down ipsilateral leg      Strength   Strength Assessment Site  Hip;Knee;Ankle    Right/Left Hip Left;Right    Right Hip Flexion 3+/5   pain in ipsilateral leg   Right Hip Extension 3-/5    Left Hip Flexion 3-/5   pain in ipsilateral leg   Left Hip Extension 2+/5    Right/Left Knee Left;Right    Right Knee Flexion 4-/5   pain in ipsilateral leg   Right Knee Extension 3+/5   pain in ipsilateral leg   Left Knee Flexion 3+/5   pain in ipsilateral leg   Left Knee Extension 3+/5   pain in ipsilateral leg   Right/Left Ankle Right;Left    Right Ankle Dorsiflexion 4/5   pain in ipsilateral leg   Left Ankle Dorsiflexion 3+/5   pain in ipsilateral leg     Palpation   Spinal mobility hypermobility at L5/S1 - pain wiht spring testing    Palpation comment tenderness to palpation throughout lumbar musculature and glutes      Special Tests    Special Tests Lumbar    Lumbar Tests Slump Test;Straight Leg Raise;Prone Knee Bend Test;FABER test      FABER test   findings Positive    Side LEft    Comment pain bilaterally      Slump test   Findings Negative      Prone Knee Bend Test   Findings Positive    Side --   bilateral     Straight Leg Raise   Findings Negative      Transfers   Transfers Sit to Stand;Stand to Sit    Sit to Stand 6: Modified independent (Device/Increase time);With upper extremity assist   slow labored movement   Stand to Sit 6: Modified independent (Device/Increase time);With upper extremity assist   slow labored movement                       Objective measurements completed on examination: See above findings.       [facility] Adult PT Treatment/Exercise - 09/20/21 0001       Exercises   Exercises Lumbar      Lumbar Exercises: Prone   Other Prone Lumbar Exercises  lying x    Other Prone Lumbar Exercises knee bend x10 B                     PT Education - 09/20/21 1645     Education Details on current condition, HEP, POC, and rational for exercises based on findings.    Person(s) Educated  Patient    Methods Explanation    Comprehension Verbalized understanding              PT Short Term Goals - 09/20/21 1609       PT SHORT TERM GOAL #1   Title Patient will be able to lay prone without pain    Time 3    Period Weeks    Status New    Target Date 10/11/21      PT SHORT TERM GOAL #2   Title Patient will be independent in self management strategies to improve quality of life and functional outcomes.    Time 3    Period Weeks    Status New    Target Date 10/11/21      PT SHORT TERM GOAL #3   Title Patient will report at least 25% improvement in overall symptoms and/or function to demonstrate improved functional mobility    Time 3    Period Weeks    Status New    Target Date 10/11/21               PT Long Term Goals - 09/20/21 1639       PT LONG TERM GOAL #1   Title Patient will be able to bend knees at least to 90 degrees in prone to demonstrate improved quad length.    Time 6    Period Weeks    Status New    Target Date 11/01/21      PT LONG TERM GOAL #2   Title Patient will improve on FOTO score by at least 5 points to demonstrate improved function    Time 6    Period Weeks    Status New    Target Date 11/01/21      PT LONG TERM GOAL #3   Title Patient will report at least 50% improvement in overall symptoms and/or function to demonstrate improved functional mobility    Time 6    Period Weeks    Status New    Target Date 11/01/21                    Plan - 09/20/21 1642     Clinical Impression Statement Patient is a 55 y.o. female who presents to physical therapy with complaint of chronic low back pain. Patient demonstrates decreased strength, ROM restriction, balance deficits and gait abnormalities which are likely contributing to symptoms of pain and are negatively impacting patient ability to perform ADLs and functional mobility tasks. Overall poor tolerance to today's session secondary to pain with all motions, will focus  on gravity eliminated ROM and isometrics initially to improve tolerance to therapeutic interventions. Patient will benefit from skilled physical therapy services to address these deficits to reduce pain, improve level of function with ADLs, functional mobility tasks, and reduce risk for falls.    Personal Factors and Comorbidities Comorbidity 1;Comorbidity 2;Fitness    Comorbidities DB, kidney stones,    Examination-Activity Limitations Bend;Squat;Sleep;Bed Mobility;Sit;Stairs;Stand;Transfers;Locomotion Level;Lift    Examination-Participation Restrictions Occupation;Meal Prep;Laundry;Yard Work;Community Activity;Shop;Cleaning    Stability/Clinical Decision Making Evolving/Moderate complexity    Clinical Decision Making  Moderate    Rehab Potential Fair    PT Frequency 2x / week    PT Duration 6 weeks    PT Treatment/Interventions ADLs/Self Care Home Management;Canalith Repostioning;Cryotherapy;Electrical Stimulation;Iontophoresis 4mg /ml Dexamethasone;Traction;Moist Heat;Therapeutic exercise;Therapeutic activities;Neuromuscular re-education;Patient/family education;Manual techniques;Dry needling;Passive range of motion    PT Next Visit Plan hip and back ROM, isometrics, core and LE strengthening, modalities for pain as needed    PT Home Exercise Plan prone lying, hamstring curls in prone             Patient will benefit from skilled therapeutic intervention in order to improve the following deficits and impairments:  Decreased activity tolerance, Decreased mobility, Decreased range of motion, Hypermobility, Postural dysfunction, Improper body mechanics, Difficulty walking, Decreased strength, Pain  Visit Diagnosis: Difficulty in walking, not elsewhere classified  Muscle weakness (generalized)  Chronic midline low back pain with bilateral sciatica     Problem List Patient Active Problem List   Diagnosis Date Noted   Cough 08/30/2021   Dyspepsia 01/18/2021   Constipation 01/18/2021    Dorsalgia 10/31/2020   GERD (gastroesophageal reflux disease) 03/09/2020   Dysphagia 03/09/2020   Hypokalemia 02/26/2020   Hyperglycemia 02/25/2020   Preventative health care 07/15/2019   Other intervertebral disc degeneration, lumbar region 05/24/2019   Palpitations 12/08/2014   Obstructive sleep apnea 12/08/2014   Depression 07/15/2013   Dyspnea 11/12/2012   Headache(784.0) 10/14/2012   Neuropathy 10/14/2012   Diabetes mellitus type II, uncontrolled (HCC) 05/10/2012   Hypertension 05/10/2012   HLD (hyperlipidemia) 09/29/2008   OBESITY 08/31/2008   4:50 PM, 09/20/21 11/20/21, DPT Physical Therapy with [facility]  [phone] office   [facility] [address] [place], [place], [place] Phone: [phone]   Fax:  [phone]  Name: [name] MRN: [name] Date of Birth: 1966/07/14

## 2021-09-21 ENCOUNTER — Encounter: Payer: Self-pay | Admitting: Family Medicine

## 2021-09-21 ENCOUNTER — Ambulatory Visit (INDEPENDENT_AMBULATORY_CARE_PROVIDER_SITE_OTHER): Payer: No Typology Code available for payment source | Admitting: Family Medicine

## 2021-09-21 ENCOUNTER — Other Ambulatory Visit (HOSPITAL_COMMUNITY): Payer: Self-pay

## 2021-09-21 ENCOUNTER — Ambulatory Visit (HOSPITAL_COMMUNITY)
Admission: RE | Admit: 2021-09-21 | Discharge: 2021-09-21 | Disposition: A | Discharge status: Home / Self Care | Payer: No Typology Code available for payment source | Source: Ambulatory Visit | Attending: Urology | Admitting: Urology

## 2021-09-21 ENCOUNTER — Other Ambulatory Visit: Payer: Self-pay | Admitting: Nurse Practitioner

## 2021-09-21 DIAGNOSIS — N200 Calculus of kidney: Secondary | ICD-10-CM | POA: Insufficient documentation

## 2021-09-21 DIAGNOSIS — R110 Nausea: Secondary | ICD-10-CM | POA: Diagnosis not present

## 2021-09-21 DIAGNOSIS — R1013 Epigastric pain: Secondary | ICD-10-CM

## 2021-09-21 DIAGNOSIS — R195 Other fecal abnormalities: Secondary | ICD-10-CM

## 2021-09-21 MED ORDER — ONDANSETRON HCL 4 MG PO TABS: 4.0000 mg | ORAL_TABLET | Freq: Three times a day (TID) | ORAL | 0 refills | Status: DC | PRN

## 2021-09-21 MED ORDER — DIPHENOXYLATE-ATROPINE 2.5-0.025 MG PO TABS: 1.0000 | ORAL_TABLET | Freq: Four times a day (QID) | ORAL | 0 refills | Status: DC | PRN

## 2021-09-21 MED ORDER — OMEPRAZOLE 40 MG PO CPDR: 40.0000 mg | DELAYED_RELEASE_CAPSULE | Freq: Every day | ORAL | 0 refills | Status: DC

## 2021-09-21 NOTE — Progress Notes (Signed)
Virtual Visit via Telephone Note  I connected with [name] on 09/21/21 at  8:00 AM EDT by telephone and verified that I am speaking with the correct person using two identifiers.  Location: Patient: home Provider: office   I discussed the limitations, risks, security and privacy concerns of performing an evaluation and management service by telephone and the availability of in person appointments. I also discussed with the patient that there may be a patient responsible charge related to this service. The patient expressed understanding and agreed to proceed.   History of Present Illness: 2 week h/o diarrhea, 3 to 4  BM / day watery, no mucus  or blood   , chills yes, no documented fever, nausea no vomit , nasty burping, no known  contact witth anyone with similar symptoms, no urinary symptoms  C/o epigastric discomfort, sour  stomach, burping Observatins/Objective: LMP 07/16/2012  Good communication with no confusion and intact memory. Alert and oriented x 3 No signs of respiratory distress during speech   Assessment and Plan: Nausea Treat symptomatically with zofran and f/u stool c/s, check H pylori  Loose stools lootil and stool testing, check hydration and potassium level  Dyspepsia Check H pylori and then start pepcid short term   Follow Up Instructions:    I discussed the assessment and treatment plan with the patient. The patient was provided an opportunity to ask questions and all were answered. The patient agreed with the plan and demonstrated an understanding of the instructions.   The patient was advised to call back or seek an in-person evaluation if the symptoms worsen or if the condition fails to improve as anticipated.  I provided 12 minutes of non-face-to-face time during this encounter.   Syliva Overman, MD

## 2021-09-21 NOTE — Patient Instructions (Addendum)
F/U as before  with M gray, call if you need to be seen sooner  Please get H pylori, chem 7 and EGFr today, also submit stool for c/s  You are being treated with medications for your symptoms, lomotil and zofran  start today, pepcid start tomorrow  Ensure you drink small amounts often so that you do not get dehydrated  If you feel worse, example, light headed, fever, increasingly weak, please go to the ED for evaluation as we discussed  Thanks for choosing [facility], we consider it a privelige to serve you.

## 2021-09-22 LAB — BMP8+EGFR
BUN/Creatinine Ratio: 16 (ref 9–23)
BUN: 14 mg/dL (ref 6–24)
CO2: 27 mmol/L (ref 20–29)
Calcium: 8.9 mg/dL (ref 8.7–10.2)
Chloride: 97 mmol/L (ref 96–106)
Creatinine, Ser: 0.87 mg/dL (ref 0.57–1.00)
Glucose: 110 mg/dL — ABNORMAL HIGH (ref 70–99)
Potassium: 4 mmol/L (ref 3.5–5.2)
Sodium: 138 mmol/L (ref 134–144)
eGFR: 79 mL/min/{1.73_m2} (ref >59)

## 2021-09-25 ENCOUNTER — Telehealth: Payer: Self-pay

## 2021-09-25 ENCOUNTER — Encounter: Payer: Self-pay | Admitting: Family Medicine

## 2021-09-25 ENCOUNTER — Telehealth: Payer: No Typology Code available for payment source | Admitting: Internal Medicine

## 2021-09-25 NOTE — Assessment & Plan Note (Addendum)
Treat symptomatically with zofran and f/u stool c/s, check H pylori

## 2021-09-25 NOTE — Assessment & Plan Note (Signed)
Check H pylori and then start pepcid short term

## 2021-09-25 NOTE — Telephone Encounter (Signed)
Patient calling stating she is having some discomfort on her left side and is requesting u/s results.

## 2021-09-25 NOTE — Assessment & Plan Note (Signed)
lootil and stool testing, check hydration and potassium level

## 2021-09-26 ENCOUNTER — Encounter: Payer: Self-pay | Admitting: Internal Medicine

## 2021-09-26 ENCOUNTER — Ambulatory Visit (INDEPENDENT_AMBULATORY_CARE_PROVIDER_SITE_OTHER): Payer: No Typology Code available for payment source | Admitting: Internal Medicine

## 2021-09-26 ENCOUNTER — Other Ambulatory Visit: Payer: Self-pay

## 2021-09-26 ENCOUNTER — Institutional Professional Consult (permissible substitution): Payer: No Typology Code available for payment source | Admitting: Internal Medicine

## 2021-09-26 ENCOUNTER — Ambulatory Visit (INDEPENDENT_AMBULATORY_CARE_PROVIDER_SITE_OTHER): Payer: No Typology Code available for payment source

## 2021-09-26 DIAGNOSIS — R059 Cough, unspecified: Secondary | ICD-10-CM | POA: Diagnosis not present

## 2021-09-26 DIAGNOSIS — I10 Essential (primary) hypertension: Secondary | ICD-10-CM | POA: Diagnosis not present

## 2021-09-26 MED ORDER — OLMESARTAN MEDOXOMIL 20 MG PO TABS: ORAL_TABLET | ORAL | Status: DC

## 2021-09-26 MED ORDER — OMEPRAZOLE 40 MG PO CPDR: DELAYED_RELEASE_CAPSULE | ORAL | Status: DC

## 2021-09-26 NOTE — Assessment & Plan Note (Addendum)
Stop lisinopril 09/26/2021  And air duo and all of your inhalers  - 09/26/2021   Walked on RA  x  3  lap(s) =  approx 500 @ fast pace, stopped due to sob with lowest 02 sats 99%  - increase prilosec to Take 30- 60 min before your first and last meals of the day until cough gone    Most likely this is Upper airway cough syndrome (previously labeled PNDS),  is so named because it's frequently impossible to sort out how much is  CR/sinusitis with freq throat clearing (which can be related to primary GERD)   vs  causing  secondary (" extra esophageal")  GERD from wide swings in gastric pressure that occur with throat clearing, often  promoting self use of mint and menthol lozenges that reduce the lower esophageal sphincter tone and exacerbate the problem further in a cyclical fashion.   These are the same pts (now being labeled as having "irritable larynx syndrome" by some cough centers) who not infrequently have a history of having failed to tolerate ace inhibitors,  dry powder inhalers like air duo  or biphosphonates or report having atypical/extraesophageal reflux symptoms that don't respond to standard doses of PPI  and are easily confused as having aecopd or asthma flares by even experienced allergists/ pulmonologists (myself included).   rec Try off acei (see separate a/p) and dpi and just use saba prn hfa Max rx for gerd, esp while still coughing to prevent cyclical cough  F/u in 4 weeks

## 2021-09-26 NOTE — Assessment & Plan Note (Signed)
Try off acei 09/26/2021   In the best review of chronic cough to date ( NEJM 2016 375 6948-5462) ,  ACEi are now felt to cause cough in up to  20% of pts which is a 4 fold increase from previous reports and does not include the variety of non-specific complaints we see in pulmonary clinic in pts on ACEi but previously attributed to another dx like  Copd/asthma and  include PNDS, throat and chest congestion, "bronchitis", unexplained dyspnea and noct "strangling" sensations, and hoarseness, but also  atypical /refractory GERD symptoms like dysphagia and "bad heartburn"   The only way I know  to prove this is not an "ACEi Case" is a trial off ACEi x a minimum of 4-6 weeks then regroup.   >>> try benicar 20 mg daily and f/u in 4 weeks, sooner if needed

## 2021-09-26 NOTE — Assessment & Plan Note (Signed)
Body mass index is 50.58 kg/m.   Lab Results  Component Value Date   TSH 1.438 11/18/2012      Contributing to doe and risk of GERD >>>   reviewed the need and the process to achieve and maintain neg calorie balance > defer f/u primary care including intermittently monitoring thyroid status      Each maintenance medication was reviewed in detail including emphasizing most importantly the difference between maintenance and prns and under what circumstances the prns are to be triggered using an action plan format where appropriate.  Total time for H and P, chart review, counseling, reviewing hfa device(s) , directly observing portions of ambulatory 02 saturation study/ and generating customized AVS unique to this office visit / same day charting = 45 min

## 2021-09-26 NOTE — Progress Notes (Signed)
[name], female    DOB: [birth year],    MRN: [id number]   Brief patient profile:  75 yowf never smoker with good activity tolerance working as Electrical engineer at [employer] referred to pulmonary clinic 09/26/2021 by Demetrius Revel NP  for sob / cough onset with 1st covid spring of 2020 and worse p 2nd episode April 2022.      Baseline wt =  spring 2020  = 225  pre covid     History of Present Illness  09/26/2021  Pulmonary/ 1st office eval/Juri Dinning / on ACEi Chief Complaint  Patient presents with   Consult    Had covid 6 months ago, since then has had a cough both non and productive.  Dyspnea: food lion x rests p one or two laps leaning on cart Cough: 24/7 mostly dry gag and feel like going to vomit with sore throat/ globus sensation as well  Sleep: 45 degrees  SABA use: saba not helping   No obvious day to day or daytime variability or assoc excess/ purulent sputum or mucus plugs or hemoptysis or cp or chest tightness, subjective wheeze or overt sinus or hb symptoms.     Also denies any obvious fluctuation of symptoms with weather or environmental changes or other aggravating or alleviating factors except as outlined above   No unusual exposure hx or h/o childhood pna/ asthma or knowledge of premature birth.  Current Allergies, Complete Past Medical History, Past Surgical History, Family History, and Social History were reviewed in [employer] record.  ROS  The following are not active complaints unless bolded Hoarseness, sore throat, dysphagia, dental problems, itching, sneezing,  nasal congestion or discharge of excess mucus or purulent secretions, ear ache,   fever, chills, sweats, unintended wt loss or wt gain, classically pleuritic or exertional cp,  orthopnea pnd or arm/hand swelling  or leg swelling, presyncope, palpitations, abdominal pain, anorexia, nausea, vomiting, diarrhea  or change in bowel habits or change in bladder habits, change in stools or change in  urine, dysuria, hematuria,  rash, arthralgias, visual complaints, headache, numbness, weakness or ataxia or problems with walking or coordination,  change in mood or  memory.           Past Medical History:  Diagnosis Date   Diabetes mellitus    insulin pump 2013   Hypercholesteremia    Hypertension    Incarcerated umbilical hernia 07/04/3663   Kidney stone    Kidney stones 08/31/2008   Qualifier: Diagnosis of  By: Jonna Munro MD, Roderic Scarce     Neuropathy    Obesity    Obstructive sleep apnea    Palpitations    Shortness of breath     Outpatient Medications Prior to Visit  Medication Sig Dispense Refill   albuterol (VENTOLIN HFA) 108 (90 Base) MCG/ACT inhaler Inhale 2 puffs into the lungs every 6 (six) hours as needed for wheezing or shortness of breath.      ARIPiprazole (ABILIFY) 2 MG tablet Take 2 mg by mouth at bedtime.      blood glucose meter kit and supplies Use up to four times daily as directed. 1 each 0   desvenlafaxine (PRISTIQ) 50 MG 24 hr tablet Take 50 mg by mouth daily.     diphenoxylate-atropine (LOMOTIL) 2.5-0.025 MG tablet Take 1 tablet by mouth 4 (four) times daily as needed for diarrhea or loose stools. 20 tablet 0   famotidine (PEPCID) 20 MG tablet Take 1 tablet (20 mg total) by mouth 2 (  two) times daily. 40 tablet 0   Fluticasone-Salmeterol,sensor, (AIRDUO DIGIHALER) 113-14 MCG/ACT AEPB Inhale 1 puff into the lungs 2 (two) times daily. 1 each 0   Furosemide (LASIX PO) Take 1 tablet by mouth 2 (two) times daily.     gabapentin (NEURONTIN) 600 MG tablet Take 600 mg by mouth 3 (three) times daily.     glucose blood test strip Test up to 4 times a day 100 each 0   HUMALOG 100 UNIT/ML injection Inject 3.5 Units into the skin continuous. 3.5 units every 1 hr-insulin pump     hydrocortisone 2.5 % cream APPLY A THIN LAYER TO THE AFFECTED AREA(S) BY TOPICAL ROUTE 2 TIMES PER DAY 20 g 0   ibuprofen (ADVIL) 800 MG tablet Take 1 tablet (800 mg total) by mouth 3 (three) times  daily. 21 tablet 0   insulin lispro (HUMALOG) 100 UNIT/ML injection Inject 80 units under the skin via omnipod daily 40 mL 0   Lancets (FREESTYLE) lancets Use to test 4 times daily 100 each 0   linaclotide (LINZESS) 145 MCG CAPS capsule Take 1 capsule (145 mcg total) by mouth daily before breakfast. 90 capsule 3   LORazepam (ATIVAN) 0.5 MG tablet Take 1 tablet (0.5 mg total) by mouth daily as needed for anxiety 30 tablet 2   methocarbamol (ROBAXIN) 500 MG tablet Take 1 tablet (500 mg total) by mouth at bedtime as needed for muscle spasms. 20 tablet 0   olmesartan (BENICAR) 20 MG tablet Take 0.5 tablets (10 mg total) by mouth daily. 45 tablet 3   omeprazole (PRILOSEC) 40 MG capsule Take 1 capsule (40 mg total) by mouth daily. 21 capsule 0   ondansetron (ZOFRAN) 4 MG tablet Take 1 tablet (4 mg total) by mouth every 8 (eight) hours as needed for nausea or vomiting. 20 tablet 0   oxybutynin (DITROPAN) 5 MG tablet Take 5 mg by mouth at bedtime.      Potassium 99 MG TABS Take 99 mg by mouth daily.     simvastatin (ZOCOR) 40 MG tablet Take 40 mg by mouth daily.      tirzepatide [facility]) 2.5 MG/0.5ML Pen Inject 2.5 mg into the skin once a week. 2 mL 0   [START ON 09/28/2021] tirzepatide (MOUNJARO) 5 MG/0.5ML Pen Inject 5 mg into the skin once a week. 6 mL 0   buPROPion (WELLBUTRIN XL) 150 MG 24 hr tablet Take 150 mg by mouth daily.   2   No facility-administered medications prior to visit.     Objective:     BP 130/70 (BP Location: Left Arm, Cuff Size: Normal)   Pulse 79   Temp 97.9 F (36.6 C) (Oral)   Ht _0  (1.422 m)   Wt 225 lb 9.6 oz (102.3 kg)   LMP 07/16/2012   SpO2 94% Comment: RA  BMI 50.58 kg/m   SpO2: 94 % (RA)  Amb mod obese pleasant wf harsh upper airway cough/ mild pseudowheeze    HEENT : pt wearing mask not removed for exam due to covid -19 concerns.    NECK :  without JVD/Nodes/TM/ nl carotid upstrokes bilaterally   LUNGS: no acc muscle use,  Nl contour chest  which is clear to A and P bilaterally without cough on insp or exp maneuvers   CV:  RRR  no s3 or murmur or increase in P2, and no edema   ABD:  obese soft and nontender with nl inspiratory excursion in the supine position. No bruits or  organomegaly appreciated, bowel sounds nl  MS:  Nl gait/ ext warm without deformities, calf tenderness, cyanosis or clubbing No obvious joint restrictions   SKIN: warm and dry without lesions    NEURO:  alert, approp, nl sensorium with  no motor or cerebellar deficits apparent.    CXR PA and Lateral:   09/26/2021 :    I personally reviewed images / impression as follows:   Low lung volumes/ no acute changes        Assessment   Cough and doe Stop lisinopril 09/26/2021  And air duo and all of your inhalers  - 09/26/2021   Walked on RA  x  3  lap(s) =  approx 500 @ fast pace, stopped due to sob with lowest 02 sats 99%  - increase prilosec to Take 30- 60 min before your first and last meals of the day until cough gone    Most likely this is Upper airway cough syndrome (previously labeled PNDS),  is so named because it's frequently impossible to sort out how much is  CR/sinusitis with freq throat clearing (which can be related to primary GERD)   vs  causing  secondary (" extra esophageal")  GERD from wide swings in gastric pressure that occur with throat clearing, often  promoting self use of mint and menthol lozenges that reduce the lower esophageal sphincter tone and exacerbate the problem further in a cyclical fashion.   These are the same pts (now being labeled as having "irritable larynx syndrome" by some cough centers) who not infrequently have a history of having failed to tolerate ace inhibitors,  dry powder inhalers like air duo  or biphosphonates or report having atypical/extraesophageal reflux symptoms that don't respond to standard doses of PPI  and are easily confused as having aecopd or asthma flares by even experienced allergists/  pulmonologists (myself included).   rec Try off acei (see separate a/p) and dpi and just use saba prn hfa Max rx for gerd, esp while still coughing to prevent cyclical cough  F/u in 4 weeks    Essential hypertension Try off acei 09/26/2021   In the best review of chronic cough to date ( NEJM 2016 375 [id number]) ,  ACEi are now felt to cause cough in up to  20% of pts which is a 4 fold increase from previous reports and does not include the variety of non-specific complaints we see in pulmonary clinic in pts on ACEi but previously attributed to another dx like  Copd/asthma and  include PNDS, throat and chest congestion, "bronchitis", unexplained dyspnea and noct "strangling" sensations, and hoarseness, but also  atypical /refractory GERD symptoms like dysphagia and "bad heartburn"   The only way I know  to prove this is not an "ACEi Case" is a trial off ACEi x a minimum of 4-6 weeks then regroup.   >>> try benicar 20 mg daily and f/u in 4 weeks, sooner if needed      Morbid obesity due to excess calories (Cope) Body mass index is 50.58 kg/m.   Lab Results  Component Value Date   TSH 1.438 11/18/2012    Contributing to doe and risk of GERD >>>   reviewed the need and the process to achieve and maintain neg calorie balance > defer f/u primary care including intermittently monitoring thyroid status       Each maintenance medication was reviewed in detail including emphasizing most importantly the difference between maintenance and prns and under what circumstances the prns  are to be triggered using an action plan format where appropriate.  Total time for H and P, chart review, counseling, reviewing hfa device(s) , directly observing portions of ambulatory 02 saturation study/ and generating customized AVS unique to this office visit / same day charting = 45 min          Christinia Gully, MD 09/26/2021

## 2021-09-26 NOTE — Progress Notes (Signed)
Sent via mychart

## 2021-09-26 NOTE — Patient Instructions (Addendum)
Stop lisinopril and air duo  Olemsartan 20 mg one daily   Increase prilosec  (omeprazole) to 40 mg Take 30- 60 min before your first and last meals of the day fornow   GERD (REFLUX)  is an extremely common cause of respiratory symptoms just like yours , many times with no obvious heartburn at all.    It can be treated with medication, but also with lifestyle changes including elevation of the head of your bed (ideally with 6 -8inch blocks under the headboard of your bed),  Smoking cessation, avoidance of late meals, excessive alcohol, and avoid fatty foods, chocolate, peppermint, colas, red wine, and acidic juices such as orange juice.  NO MINT OR MENTHOL PRODUCTS SO NO COUGH DROPS  USE SUGARLESS CANDY INSTEAD (Jolley ranchers or Stover's or Life Savers) or even ice chips will also do - the key is to swallow to prevent all throat clearing. NO OIL BASED VITAMINS - use powdered substitutes.  Avoid fish oil when coughing.   At home be sure to let us know any errors on your list  We will walk you before you go   Please remember to go to the  x-ray department  for your tests - we will call you with the results when they are available    Please schedule a follow up office visit in 4 weeks, sooner if needed  in [place]

## 2021-09-27 ENCOUNTER — Encounter: Payer: Self-pay | Admitting: Internal Medicine

## 2021-09-28 ENCOUNTER — Telehealth: Payer: Self-pay

## 2021-09-28 NOTE — Telephone Encounter (Signed)
Patient made aware Kub results. Patient states that she still has discomfort on her left side. Please advise.

## 2021-09-29 MED ORDER — CYCLOBENZAPRINE HCL 5 MG PO TABS: 5.0000 mg | ORAL_TABLET | Freq: Three times a day (TID) | ORAL | 0 refills | Status: DC | PRN

## 2021-09-29 NOTE — Telephone Encounter (Signed)
Patient called and made aware of rx sent to pharmacy. ?

## 2021-09-29 NOTE — Telephone Encounter (Signed)
Rx sent. Patient called and made aware. 

## 2021-10-02 ENCOUNTER — Other Ambulatory Visit: Payer: Self-pay

## 2021-10-02 MED ORDER — GLUCOSE BLOOD VI STRP: ORAL_STRIP | 0 refills | Status: AC

## 2021-10-05 ENCOUNTER — Telehealth: Payer: Self-pay

## 2021-10-05 ENCOUNTER — Other Ambulatory Visit (HOSPITAL_COMMUNITY): Payer: Self-pay

## 2021-10-05 ENCOUNTER — Other Ambulatory Visit: Payer: Self-pay | Admitting: Nurse Practitioner

## 2021-10-05 MED ORDER — SIMVASTATIN 40 MG PO TABS
40.0000 mg | ORAL_TABLET | Freq: Every evening | ORAL | 0 refills | Status: DC
  Filled 2021-10-05: qty 90, 90d supply, fill #0

## 2021-10-05 NOTE — Telephone Encounter (Signed)
Called in c/o chest pain on left side and left arm pain with SOB, advised she go to the ER. Pt understands

## 2021-10-06 ENCOUNTER — Other Ambulatory Visit: Payer: Self-pay | Admitting: Nurse Practitioner

## 2021-10-06 ENCOUNTER — Other Ambulatory Visit (HOSPITAL_COMMUNITY): Payer: Self-pay

## 2021-10-06 MED ORDER — INSULIN LISPRO 100 UNIT/ML IJ SOLN
80.0000 [IU] | INTRAMUSCULAR | 0 refills | Status: DC
  Filled 2021-10-06: qty 40, 50d supply, fill #0

## 2021-10-09 ENCOUNTER — Ambulatory Visit: Payer: No Typology Code available for payment source | Admitting: Cardiology

## 2021-10-09 ENCOUNTER — Other Ambulatory Visit (HOSPITAL_COMMUNITY): Payer: Self-pay

## 2021-10-10 ENCOUNTER — Telehealth: Payer: Self-pay | Admitting: Nurse Practitioner

## 2021-10-10 ENCOUNTER — Encounter (HOSPITAL_COMMUNITY): Payer: Self-pay | Admitting: Emergency Medicine

## 2021-10-10 ENCOUNTER — Emergency Department (HOSPITAL_COMMUNITY)
Admission: EM | Admit: 2021-10-10 | Discharge: 2021-10-10 | Disposition: A | Discharge status: Home / Self Care | Payer: No Typology Code available for payment source | Attending: Emergency Medicine | Admitting: Emergency Medicine

## 2021-10-10 ENCOUNTER — Emergency Department (HOSPITAL_COMMUNITY): Payer: No Typology Code available for payment source

## 2021-10-10 ENCOUNTER — Other Ambulatory Visit: Payer: Self-pay

## 2021-10-10 DIAGNOSIS — R0602 Shortness of breath: Secondary | ICD-10-CM | POA: Insufficient documentation

## 2021-10-10 DIAGNOSIS — Z794 Long term (current) use of insulin: Secondary | ICD-10-CM | POA: Diagnosis not present

## 2021-10-10 DIAGNOSIS — M79605 Pain in left leg: Secondary | ICD-10-CM | POA: Insufficient documentation

## 2021-10-10 DIAGNOSIS — E1140 Type 2 diabetes mellitus with diabetic neuropathy, unspecified: Secondary | ICD-10-CM | POA: Insufficient documentation

## 2021-10-10 DIAGNOSIS — R079 Chest pain, unspecified: Secondary | ICD-10-CM

## 2021-10-10 DIAGNOSIS — I10 Essential (primary) hypertension: Secondary | ICD-10-CM | POA: Insufficient documentation

## 2021-10-10 DIAGNOSIS — R110 Nausea: Secondary | ICD-10-CM | POA: Diagnosis not present

## 2021-10-10 DIAGNOSIS — Z79899 Other long term (current) drug therapy: Secondary | ICD-10-CM | POA: Diagnosis not present

## 2021-10-10 LAB — CBC WITH DIFFERENTIAL/PLATELET
Abs Immature Granulocytes: 0.02 10*3/uL (ref 0.00–0.07)
Basophils Absolute: 0.1 10*3/uL (ref 0.0–0.1)
Basophils Relative: 1 %
Eosinophils Absolute: 0.1 10*3/uL (ref 0.0–0.5)
Eosinophils Relative: 2 %
HCT: 37.6 % (ref 36.0–46.0)
Hemoglobin: 12.5 g/dL (ref 12.0–15.0)
Immature Granulocytes: 0 %
Lymphocytes Relative: 36 %
Lymphs Abs: 3.2 10*3/uL (ref 0.7–4.0)
MCH: 28.7 pg (ref 26.0–34.0)
MCHC: 33.2 g/dL (ref 30.0–36.0)
MCV: 86.2 fL (ref 80.0–100.0)
Monocytes Absolute: 0.8 10*3/uL (ref 0.1–1.0)
Monocytes Relative: 9 %
Neutro Abs: 4.7 10*3/uL (ref 1.7–7.7)
Neutrophils Relative %: 52 %
Platelets: 294 10*3/uL (ref 150–400)
RBC: 4.36 MIL/uL (ref 3.87–5.11)
RDW: 13.3 % (ref 11.5–15.5)
WBC: 8.8 10*3/uL (ref 4.0–10.5)
nRBC: 0 % (ref 0.0–0.2)

## 2021-10-10 LAB — COMPREHENSIVE METABOLIC PANEL
ALT: 22 U/L (ref 0–44)
AST: 21 U/L (ref 15–41)
Albumin: 3.6 g/dL (ref 3.5–5.0)
Alkaline Phosphatase: 96 U/L (ref 38–126)
Anion gap: 7 (ref 5–15)
BUN: 14 mg/dL (ref 6–20)
CO2: 28 mmol/L (ref 22–32)
Calcium: 8.6 mg/dL — ABNORMAL LOW (ref 8.9–10.3)
Chloride: 103 mmol/L (ref 98–111)
Creatinine, Ser: 0.75 mg/dL (ref 0.44–1.00)
GFR, Estimated: >60 mL/min (ref >60)
Glucose, Bld: 107 mg/dL — ABNORMAL HIGH (ref 70–99)
Potassium: 3.4 mmol/L — ABNORMAL LOW (ref 3.5–5.1)
Sodium: 138 mmol/L (ref 135–145)
Total Bilirubin: 0.6 mg/dL (ref 0.3–1.2)
Total Protein: 6.8 g/dL (ref 6.5–8.1)

## 2021-10-10 LAB — HM DIABETES EYE EXAM

## 2021-10-10 LAB — BRAIN NATRIURETIC PEPTIDE: B Natriuretic Peptide: 20 pg/mL (ref 0.0–100.0)

## 2021-10-10 LAB — TROPONIN I (HIGH SENSITIVITY): Troponin I (High Sensitivity): <2 ng/L (ref <18)

## 2021-10-10 LAB — D-DIMER, QUANTITATIVE: D-Dimer, Quant: 0.5 ug/mL-FEU (ref 0.00–0.50)

## 2021-10-10 MED ORDER — OXYCODONE-ACETAMINOPHEN 5-325 MG PO TABS
1.0000 | ORAL_TABLET | Freq: Once | ORAL | Status: AC
  Administered 2021-10-10: 1 via ORAL
  Filled 2021-10-10: qty 1

## 2021-10-10 NOTE — Telephone Encounter (Signed)
This will need an evaluation with available provider.

## 2021-10-10 NOTE — ED Provider Notes (Signed)
[facility] EMERGENCY DEPARTMENT Provider Note   CSN: [id number] Arrival date & time: 10/10/21  0121     History Chief Complaint  Patient presents with   Chest Pain    [name] is a 55 y.o. female.  Patient presents to the emergency department for evaluation of chest pain.  Patient reports that pain began earlier today and was occurring intermittently through the day.  She did not identify anything that caused it or made it better.  Since 8 PM, however, the pain has been continuous.  She has had some nausea associated with it as well as shortness of breath.  Patient reports that her left leg has been hurting for approximately 2 weeks.  This has worsened today also.  She denies any injury.   HPI: A 55 year old patient with a history of treated diabetes, hypertension, hypercholesterolemia and obesity presents for evaluation of chest pain. Initial onset of pain was more than 6 hours ago. The patient's chest pain is not worse with exertion. The patient complains of nausea. The patient's chest pain is middle- or left-sided, is not well-localized, is not described as heaviness/pressure/tightness, is not sharp and does not radiate to the arms/jaw/neck. The patient denies diaphoresis. The patient has no history of stroke, has no history of peripheral artery disease, has not smoked in the past 90 days and has no relevant family history of coronary artery disease (first degree relative at less than age 40).   Past Medical History:  Diagnosis Date   Diabetes mellitus    insulin pump 2013   Hypercholesteremia    Hypertension    Incarcerated umbilical hernia 01/11/5426   Kidney stone    Kidney stones 08/31/2008   Qualifier: Diagnosis of  By: Jonna Munro MD, Cornelius     Neuropathy    Obesity    Obstructive sleep apnea    Palpitations    Shortness of breath     Patient Active Problem List   Diagnosis Date Noted   Loose stools 09/21/2021   Nausea 09/21/2021   Cough and doe 08/30/2021    Dyspepsia 01/18/2021   Constipation 01/18/2021   Dorsalgia 10/31/2020   GERD (gastroesophageal reflux disease) 03/09/2020   Dysphagia 03/09/2020   Hypokalemia 02/26/2020   Hyperglycemia 02/25/2020   Preventative health care 07/15/2019   Other intervertebral disc degeneration, lumbar region 05/24/2019   Palpitations 12/08/2014   Obstructive sleep apnea 12/08/2014   Depression 07/15/2013   Dyspnea 11/12/2012   Headache(784.0) 10/14/2012   Neuropathy 10/14/2012   Diabetes mellitus type II, uncontrolled ([place]) 05/10/2012   Essential hypertension 05/10/2012   HLD (hyperlipidemia) 09/29/2008   Morbid obesity due to excess calories ([place]) 08/31/2008    Past Surgical History:  Procedure Laterality Date   BIOPSY  06/23/2020   Procedure: BIOPSY;  Surgeon: Daneil Dolin, MD;  Location: [facility];  Service: Endoscopy;;   CARDIAC CATHETERIZATION  12/11/2012   normal coronary arteries   CESAREAN SECTION  1994;2000   [place]   CHOLECYSTECTOMY  1992   COLONOSCOPY WITH PROPOFOL N/A 06/23/2020   large amount of stool in entire colon, prep inadequate.    CYSTOSCOPY W/ URETERAL STENT PLACEMENT  05/08/2012   Procedure: CYSTOSCOPY WITH RETROGRADE PYELOGRAM/URETERAL STENT PLACEMENT;  Surgeon: Marissa Nestle, MD;  Location: [facility];  Service: Urology;  Laterality: Right;   CYSTOSCOPY WITH RETROGRADE PYELOGRAM, URETEROSCOPY AND STENT PLACEMENT Right 05/04/2020   Procedure: CYSTOSCOPY WITH RIGHT RETROGRADE PYELOGRAM, RIGHT URETEROSCOPY AND RIGHT URETERAL STENT EXCHANGE;  Surgeon: Cleon Gustin, MD;  Location: [facility];  Service: Urology;  Laterality: Right;   CYSTOSCOPY WITH RETROGRADE PYELOGRAM, URETEROSCOPY AND STENT PLACEMENT Left 08/08/2020   Procedure: CYSTOSCOPY WITH RETROGRADE PYELOGRAM, URETEROSCOPY WITH LASER  AND STENT PLACEMENT;  Surgeon: Cleon Gustin, MD;  Location: [facility];  Service: Urology;  Laterality: Left;   CYSTOSCOPY WITH RETROGRADE PYELOGRAM, URETEROSCOPY AND STENT  PLACEMENT Right 11/08/2020   Procedure: CYSTOSCOPY WITH RIGHT RETROGRADE PYELOGRAM, RIGHT URETEROSCOPY AND STENT PLACEMENT;  Surgeon: Cleon Gustin, MD;  Location: [facility];  Service: Urology;  Laterality: Right;   CYSTOSCOPY WITH RETROGRADE PYELOGRAM, URETEROSCOPY AND STENT PLACEMENT Left 12/27/2020   Procedure: CYSTOSCOPY WITH LEFT RETROGRADE PYELOGRAM, LEFT URETEROSCOPY AND LEFT URETERAL STENT PLACEMENT;  Surgeon: Cleon Gustin, MD;  Location: [facility];  Service: Urology;  Laterality: Left;   CYSTOSCOPY WITH RETROGRADE PYELOGRAM, URETEROSCOPY AND STENT PLACEMENT Bilateral 03/16/2021   Procedure: CYSTOSCOPY WITH BILATERAL  RETROGRADE PYELOGRAM, BILATERAL URETEROSCOPY AND STENT PLACEMENT ON THE LEFT;  Surgeon: Cleon Gustin, MD;  Location: [facility];  Service: Urology;  Laterality: Bilateral;   CYSTOSCOPY WITH STENT PLACEMENT Right 02/25/2020   Procedure: CYSTOSCOPY WITH RIGHT RETROGRADE RIGHT STENT PLACEMENT;  Surgeon: Festus Aloe, MD;  Location: [facility];  Service: Urology;  Laterality: Right;   ESOPHAGOGASTRODUODENOSCOPY (EGD) WITH PROPOFOL N/A 06/23/2020   Normal esophagus, multiple localized erosions were found in gastric antrum s/p biopsy, normal duodenum. Empiric dilation.Reactive gastropathy with erosions, negative H.pylori, no dysplasia.     EXTRACORPOREAL SHOCK WAVE LITHOTRIPSY Right 10/11/2020   Procedure: EXTRACORPOREAL SHOCK WAVE LITHOTRIPSY (ESWL);  Surgeon: Cleon Gustin, MD;  Location: [facility];  Service: Urology;  Laterality: Right;   FOOT SURGERY Right March, 2013   [facility]-removal of bone spur   HOLMIUM LASER APPLICATION  6/33/3545   Procedure: HOLMIUM LASER LITHOTRIPSY RIGHT URETERAL CALCULUS;  Surgeon: Cleon Gustin, MD;  Location: [facility];  Service: Urology;;   HYSTEROSCOPY WITH THERMACHOICE  04/25/2012   Procedure: HYSTEROSCOPY WITH THERMACHOICE;  Surgeon: Florian Buff, MD;  Location: [facility];  Service: Gynecology;  Laterality: N/A;  total therapy  time= 11 minutes 42 seconds; 34 ml D5w  in and 34 ml D5w out; temp =87 degrees F   LEFT HEART CATHETERIZATION WITH CORONARY ANGIOGRAM N/A 12/11/2012   Procedure: LEFT HEART CATHETERIZATION WITH CORONARY ANGIOGRAM;  Surgeon: Lorretta Harp, MD;  Location: [facility] CATH LAB;  Service: Cardiovascular;  Laterality: N/A;   MALONEY DILATION N/A 06/23/2020   Procedure: Venia Minks DILATION;  Surgeon: Daneil Dolin, MD;  Location[facility];  Service: Endoscopy;  Laterality: N/A;   Sinus sergery  1988   [place]   STONE EXTRACTION WITH BASKET Right 11/08/2020   Procedure: STONE EXTRACTION WITH BASKET;  Surgeon: Cleon Gustin, MD;  Location: [facility];  Service: Urology;  Laterality: Right;   UMBILICAL HERNIA REPAIR N/A 03/25/2014   Procedure: UMBILICAL HERNIA REPAIR;  Surgeon: Scherry Ran, MD;  Location: [facility];  Service: General;  Laterality: N/A;   uterine ablation       OB History     Gravida  2   Para  2   Term  2   Preterm      AB      Living  2      SAB      IAB      Ectopic      Multiple      Live Births              Family  History  Problem Relation Age of Onset   Cancer Mother    Diabetes Mother    Cancer Paternal Uncle    Depression Paternal Grandmother    Depression Paternal Grandfather    Heart disease Other    Cancer Other    Diabetes Other    Achalasia Other    Anesthesia problems Neg Hx    Hypotension Neg Hx    Malignant hyperthermia Neg Hx    Pseudochol deficiency Neg Hx     Social History   Tobacco Use   Smoking status: Never   Smokeless tobacco: Never  Vaping Use   Vaping Use: Never used  Substance Use Topics   Alcohol use: No   Drug use: No    Home Medications Prior to Admission medications   Medication Sig Start Date End Date Taking? Authorizing Provider  albuterol (VENTOLIN HFA) 108 (90 Base) MCG/ACT inhaler Inhale 2 puffs into the lungs every 6 (six) hours as needed for wheezing or shortness of breath.  08/27/19   [provider]  ARIPiprazole (ABILIFY) 2 MG tablet Take 2 mg by mouth at bedtime.  04/13/19   [provider]  blood glucose meter kit and supplies Use up to four times daily as directed. 08/31/21   [name], NP  cyclobenzaprine (FLEXERIL) 5 MG tablet Take 1 tablet (5 mg total) by mouth 3 (three) times daily as needed for muscle spasms. 09/29/21   McKenzie, Candee Furbish, MD  desvenlafaxine (PRISTIQ) 50 MG 24 hr tablet Take 50 mg by mouth daily. 02/02/21   [provider]  diphenoxylate-atropine (LOMOTIL) 2.5-0.025 MG tablet Take 1 tablet by mouth 4 (four) times daily as needed for diarrhea or loose stools. 09/21/21   Fayrene Helper, MD  Furosemide (LASIX PO) Take 1 tablet by mouth 2 (two) times daily.    [provider]  gabapentin (NEURONTIN) 600 MG tablet Take 600 mg by mouth 3 (three) times daily.    [provider]  glucose blood test strip Test up to 4 times a day 10/02/21   Lindell Spar, MD  HUMALOG 100 UNIT/ML injection Inject 3.5 Units into the skin continuous. 3.5 units every 1 hr-insulin pump 01/06/15   [provider]  hydrocortisone 2.5 % cream APPLY A THIN LAYER TO THE AFFECTED AREA(S) BY TOPICAL ROUTE 2 TIMES PER DAY 07/24/21     ibuprofen (ADVIL) 800 MG tablet Take 1 tablet (800 mg total) by mouth 3 (three) times daily. 08/12/21   Chase Picket, MD  insulin lispro (HUMALOG) 100 UNIT/ML injection Inject 80 units under the skin via omnipod daily 10/06/21   Fayrene Helper, MD  Lancets (FREESTYLE) lancets Use to test 4 times daily 08/31/21   [name], NP  linaclotide [facility]) 145 MCG CAPS capsule Take 1 capsule (145 mcg total) by mouth daily before breakfast. 03/14/21   Annitta Needs, NP  LORazepam (ATIVAN) 0.5 MG tablet Take 1 tablet (0.5 mg total) by mouth daily as needed for anxiety 08/24/21     methocarbamol (ROBAXIN) 500 MG tablet Take 1 tablet (500 mg total) by mouth at bedtime as needed for muscle spasms. 08/12/21   Chase Picket, MD  olmesartan (BENICAR) 20 MG tablet One tablet daily 09/26/21   Tanda Rockers, MD  omeprazole (PRILOSEC) 40 MG capsule Take 30- 60 min before your first and last meals of the day 09/26/21   Tanda Rockers, MD  ondansetron (ZOFRAN) 4 MG tablet Take 1  tablet (4 mg total) by mouth every 8 (eight) hours as needed for nausea or vomiting. 09/21/21   Fayrene Helper, MD  oxybutynin (DITROPAN) 5 MG tablet Take 5 mg by mouth at bedtime.  09/11/20   [provider]  Potassium 99 MG TABS Take 99 mg by mouth daily.    [provider]  simvastatin (ZOCOR) 40 MG tablet Take 40 mg by mouth daily.  03/29/19   [provider]  simvastatin (ZOCOR) 40 MG tablet Take 1 tablet (40 mg total) by mouth every evening for cholesterol 10/05/21   [name], NP  tirzepatide [facility]) 2.5 MG/0.5ML Pen Inject 2.5 mg into the skin once a week. 08/31/21   [name], NP  tirzepatide [facility]) 5 MG/0.5ML Pen Inject 5 mg into the skin once a week. 09/28/21   [name], NP    Allergies    Ciprofloxacin, Penicillins, Codeine, Morphine, Sulfonamide derivatives, and Vancomycin  Review of Systems   Review of Systems  Respiratory:  Positive for shortness of breath.   Cardiovascular:  Positive for chest pain.  Gastrointestinal:  Positive for nausea.  Musculoskeletal:  Positive for myalgias.  All other systems reviewed and are negative.  Physical Exam Updated Vital Signs BP (!) 130/59   Pulse 71   Temp 97.8 F (36.6 C)   Resp 19   Ht 4' 8"  (1.422 m)   Wt 95.3 kg   LMP 07/16/2012   SpO2 97%   BMI 47.08 kg/m   Physical Exam Vitals and nursing note reviewed.  Constitutional:      General: She is not in acute distress.    Appearance: Normal appearance. She is well-developed.  HENT:     Head: Normocephalic and atraumatic.     Right Ear: Hearing normal.     Left Ear: Hearing normal.     Nose: Nose normal.  Eyes:     Conjunctiva/sclera: Conjunctivae normal.      Pupils: Pupils are equal, round, and reactive to light.  Cardiovascular:     Rate and Rhythm: Regular rhythm.     Heart sounds: S1 normal and S2 normal. No murmur heard.   No friction rub. No gallop.  Pulmonary:     Effort: Pulmonary effort is normal. No respiratory distress.     Breath sounds: Normal breath sounds.  Chest:     Chest wall: No tenderness.  Abdominal:     General: Bowel sounds are normal.     Palpations: Abdomen is soft.     Tenderness: There is no abdominal tenderness. There is no guarding or rebound. Negative signs include Murphy's sign and McBurney's sign.     Hernia: No hernia is present.  Musculoskeletal:        General: Normal range of motion.     Cervical back: Normal range of motion and neck supple.     Comments: No significant swelling of the left leg, including calf area but she does have diffuse soft tissue tenderness.  Skin:    General: Skin is warm and dry.     Findings: No rash.  Neurological:     Mental Status: She is alert and oriented to person, place, and time.     GCS: GCS eye subscore is 4. GCS verbal subscore is 5. GCS motor subscore is 6.     Cranial Nerves: No cranial nerve deficit.     Sensory: No sensory deficit.     Coordination: Coordination normal.  Psychiatric:  Speech: Speech normal.        Behavior: Behavior normal.        Thought Content: Thought content normal.    ED Results / Procedures / Treatments   Labs (all labs ordered are listed, but only abnormal results are displayed) Labs Reviewed  COMPREHENSIVE METABOLIC PANEL - Abnormal; Notable for the following components:      Result Value   Potassium 3.4 (*)    Glucose, Bld 107 (*)    Calcium 8.6 (*)    All other components within normal limits  CBC WITH DIFFERENTIAL/PLATELET  BRAIN NATRIURETIC PEPTIDE  D-DIMER, QUANTITATIVE  TROPONIN I (HIGH SENSITIVITY)  TROPONIN I (HIGH SENSITIVITY)    EKG EKG Interpretation  Date/Time:  Tuesday October 10 2021 01:54:30  EDT Ventricular Rate:  75 PR Interval:  154 QRS Duration: 74 QT Interval:  384 QTC Calculation: 429 R Axis:   47 Text Interpretation: Sinus rhythm Anteroseptal infarct, age indeterminate Confirmed by Orpah Greek [phone]) on 10/10/2021 3:44:22 AM  Radiology DG Chest Port 1 View  Result Date: 10/10/2021 CLINICAL DATA:  Chest pain and shortness of breath EXAM: PORTABLE CHEST 1 VIEW COMPARISON:  09/26/2021 FINDINGS: The heart size and mediastinal contours are within normal limits. Both lungs are clear. The visualized skeletal structures are unremarkable. IMPRESSION: No active disease. Electronically Signed   By: Ulyses Jarred M.D.   On: 10/10/2021 02:31    Procedures Procedures   Medications Ordered in ED Medications - No data to display  ED Course  I have reviewed the triage vital signs and the nursing notes.  Pertinent labs & imaging results that were available during my care of the patient were reviewed by me and considered in my medical decision making (see chart for details).    MDM Rules/Calculators/A&P HEAR Score: 4                         Patient presents to the emergency department for evaluation of chest pain.  Patient has been having intermittent pain through the day, now having pain since 8 PM.  She reports some nausea and shortness of breath.  Patient also indicates that she has been having left leg pain for several weeks.  Pain goes from the hip to the toes.  No unilateral swelling.  Patient has 2 negative, undetectable troponins.  EKG without ischemic changes.  She does have some cardiac risk factors but is felt to be low risk for cardiac etiology and can follow-up as an outpatient.  D-dimer was 0.5, negative by our current lab cut off.  Therefore does not require DVT/PE work-up.  Final Clinical Impression(s) / ED Diagnoses Final diagnoses:  Chest pain, unspecified type    Rx / DC Orders ED Discharge Orders          Ordered    Ambulatory referral to  Cardiology        10/10/21 0345             Orpah Greek, MD 10/10/21 0345

## 2021-10-10 NOTE — ED Triage Notes (Signed)
Pt c/o chest pain, sob, nausea and left leg pain x one week.

## 2021-10-10 NOTE — Telephone Encounter (Signed)
Pt called in about chest and legs. Pt went to ED 10/31 with chest pains. Still having pain, but also having leg issues. Left leg feels as if its being twisted  and also has swelling I left foot. Pt wants to see if she can get referral for xray  Pt also wants to know if she can get rapid referral to be seen by cardiologist faster. Has referral and appoint but appoint isn't until the end of the month.

## 2021-10-11 ENCOUNTER — Other Ambulatory Visit: Payer: Self-pay

## 2021-10-11 ENCOUNTER — Ambulatory Visit
Admission: EM | Admit: 2021-10-11 | Discharge: 2021-10-11 | Disposition: A | Discharge status: Home / Self Care | Payer: No Typology Code available for payment source | Attending: Urgent Care | Admitting: Urgent Care

## 2021-10-11 DIAGNOSIS — M79662 Pain in left lower leg: Secondary | ICD-10-CM | POA: Diagnosis not present

## 2021-10-11 DIAGNOSIS — M79661 Pain in right lower leg: Secondary | ICD-10-CM

## 2021-10-11 DIAGNOSIS — M791 Myalgia, unspecified site: Secondary | ICD-10-CM

## 2021-10-11 MED ORDER — CYCLOBENZAPRINE HCL 5 MG PO TABS: 5.0000 mg | ORAL_TABLET | Freq: Three times a day (TID) | ORAL | 0 refills | Status: DC | PRN

## 2021-10-11 NOTE — ED Provider Notes (Signed)
[place]   MRN: [id number] DOB: 1966/02/04  Subjective:   [name] is a 55 y.o. female presenting for 2-week history of persistent right lower leg pain that is now starting to affect the left lower leg as well.  Patient states that she has difficulty walking from the pain.  No redness, hot sensation.  She has concerns about a blood clot because there is a family history of the same.  She does not have a clotting disorder.  Has never had a DVT.  She did go to the emergency room within the past 48 hours but unfortunately was not able to get an ultrasound done.  Lab work is as below.  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  Does a lot of walking, standing.  Does not wear compression socks.  No fever, trauma, swelling, no history of varicose veins to her knowledge.  No current facility-administered medications for this encounter.  Current Outpatient Medications:    albuterol (VENTOLIN HFA) 108 (90 Base) MCG/ACT inhaler, Inhale 2 puffs into the lungs every 6 (six) hours as needed for wheezing or shortness of breath. , Disp: , Rfl:    ARIPiprazole (ABILIFY) 2 MG tablet, Take 2 mg by mouth at bedtime. , Disp: , Rfl:    blood glucose meter kit and supplies, Use up to four times daily as directed., Disp: 1 each, Rfl: 0   cyclobenzaprine (FLEXERIL) 5 MG tablet, Take 1 tablet (5 mg total) by mouth 3 (three) times daily as needed for muscle spasms., Disp: 30 tablet, Rfl: 0   desvenlafaxine (PRISTIQ) 50 MG 24 hr tablet, Take 50 mg by mouth daily., Disp: , Rfl:    diphenoxylate-atropine (LOMOTIL) 2.5-0.025 MG tablet, Take 1 tablet by mouth 4 (four) times daily as needed for diarrhea or loose stools., Disp: 20 tablet, Rfl: 0   Furosemide (LASIX PO), Take 1 tablet by mouth 2 (two) times daily., Disp: , Rfl:    gabapentin (NEURONTIN) 600 MG tablet, Take 600 mg by mouth 3 (three) times daily., Disp: , Rfl:    glucose blood test strip, Test up to 4 times a day, Disp: 100 each, Rfl:  0   HUMALOG 100 UNIT/ML injection, Inject 3.5 Units into the skin continuous. 3.5 units every 1 hr-insulin pump, Disp: , Rfl:    hydrocortisone 2.5 % cream, APPLY A THIN LAYER TO THE AFFECTED AREA(S) BY TOPICAL ROUTE 2 TIMES PER DAY, Disp: 20 g, Rfl: 0   ibuprofen (ADVIL) 800 MG tablet, Take 1 tablet (800 mg total) by mouth 3 (three) times daily., Disp: 21 tablet, Rfl: 0   insulin lispro (HUMALOG) 100 UNIT/ML injection, Inject 80 units under the skin via omnipod daily, Disp: 40 mL, Rfl: 0   Lancets (FREESTYLE) lancets, Use to test 4 times daily, Disp: 100 each, Rfl: 0   linaclotide (LINZESS) 145 MCG CAPS capsule, Take 1 capsule (145 mcg total) by mouth daily before breakfast., Disp: 90 capsule, Rfl: 3   LORazepam (ATIVAN) 0.5 MG tablet, Take 1 tablet (0.5 mg total) by mouth daily as needed for anxiety, Disp: 30 tablet, Rfl: 2   methocarbamol (ROBAXIN) 500 MG tablet, Take 1 tablet (500 mg total) by mouth at bedtime as needed for muscle spasms., Disp: 20 tablet, Rfl: 0   olmesartan (BENICAR) 20 MG tablet, One tablet daily, Disp: , Rfl:    omeprazole (PRILOSEC) 40 MG capsule, Take 30- 60 min before your first and last meals of the day, Disp: , Rfl:  ondansetron (ZOFRAN) 4 MG tablet, Take 1 tablet (4 mg total) by mouth every 8 (eight) hours as needed for nausea or vomiting., Disp: 20 tablet, Rfl: 0   oxybutynin (DITROPAN) 5 MG tablet, Take 5 mg by mouth at bedtime. , Disp: , Rfl:    Potassium 99 MG TABS, Take 99 mg by mouth daily., Disp: , Rfl:    simvastatin (ZOCOR) 40 MG tablet, Take 40 mg by mouth daily. , Disp: , Rfl:    simvastatin (ZOCOR) 40 MG tablet, Take 1 tablet (40 mg total) by mouth every evening for cholesterol, Disp: 150 tablet, Rfl: 0   tirzepatide (MOUNJARO) 2.5 MG/0.5ML Pen, Inject 2.5 mg into the skin once a week., Disp: 2 mL, Rfl: 0   tirzepatide (MOUNJARO) 5 MG/0.5ML Pen, Inject 5 mg into the skin once a week., Disp: 6 mL, Rfl: 0   Allergies  Allergen Reactions   Ciprofloxacin  Hives and Swelling   Penicillins Anaphylaxis and Rash   Codeine Other (See Comments)    had hallicunations   Morphine Nausea And Vomiting and Other (See Comments)    recieved in ED due to [facility] and had multple doses - gave visual hallucinations and vomitting.   Sulfonamide Derivatives Other (See Comments)    REACTION: Unsure - childhood allergy   Vancomycin Itching    Past Medical History:  Diagnosis Date   Diabetes mellitus    insulin pump 2013   Hypercholesteremia    Hypertension    Incarcerated umbilical hernia 06/09/7792   Kidney stone    Kidney stones 08/31/2008   Qualifier: Diagnosis of  By: Jonna Munro MD, Cornelius     Neuropathy    Obesity    Obstructive sleep apnea    Palpitations    Shortness of breath      Past Surgical History:  Procedure Laterality Date   BIOPSY  06/23/2020   Procedure: BIOPSY;  Surgeon: Daneil Dolin, MD;  [facility];  Service: Endoscopy;;   CARDIAC CATHETERIZATION  12/11/2012   normal coronary arteries   CESAREAN SECTION  1994;2000   [place]   COLONOSCOPY WITH PROPOFOL N/A 06/23/2020   large amount of stool in entire colon, prep inadequate.    CYSTOSCOPY W/ URETERAL STENT PLACEMENT  05/08/2012   Procedure: CYSTOSCOPY WITH RETROGRADE PYELOGRAM/URETERAL STENT PLACEMENT;  Surgeon: Marissa Nestle, MD;  Location: [facility];  Service: Urology;  Laterality: Right;   CYSTOSCOPY WITH RETROGRADE PYELOGRAM, URETEROSCOPY AND STENT PLACEMENT Right 05/04/2020   Procedure: CYSTOSCOPY WITH RIGHT RETROGRADE PYELOGRAM, RIGHT URETEROSCOPY AND RIGHT URETERAL STENT EXCHANGE;  Surgeon: Cleon Gustin, MD;  Location: [facility];  Service: Urology;  Laterality: Right;   CYSTOSCOPY WITH RETROGRADE PYELOGRAM, URETEROSCOPY AND STENT PLACEMENT Left 08/08/2020   Procedure: CYSTOSCOPY WITH RETROGRADE PYELOGRAM, URETEROSCOPY WITH LASER  AND STENT PLACEMENT;  Surgeon: Cleon Gustin, MD;  Location: [facility];  Service: Urology;   Laterality: Left;   CYSTOSCOPY WITH RETROGRADE PYELOGRAM, URETEROSCOPY AND STENT PLACEMENT Right 11/08/2020   Procedure: CYSTOSCOPY WITH RIGHT RETROGRADE PYELOGRAM, RIGHT URETEROSCOPY AND STENT PLACEMENT;  Surgeon: Cleon Gustin, MD;  Location: [facility];  Service: Urology;  Laterality: Right;   CYSTOSCOPY WITH RETROGRADE PYELOGRAM, URETEROSCOPY AND STENT PLACEMENT Left 12/27/2020   Procedure: CYSTOSCOPY WITH LEFT RETROGRADE PYELOGRAM, LEFT URETEROSCOPY AND LEFT URETERAL STENT PLACEMENT;  Surgeon: Cleon Gustin, MD;  Location: [facility];  Service: Urology;  Laterality: Left;   CYSTOSCOPY WITH RETROGRADE PYELOGRAM, URETEROSCOPY AND STENT PLACEMENT Bilateral 03/16/2021  Procedure: CYSTOSCOPY WITH BILATERAL  RETROGRADE PYELOGRAM, BILATERAL URETEROSCOPY AND STENT PLACEMENT ON THE LEFT;  Surgeon: Cleon Gustin, MD;  Location: [facility];  Service: Urology;  Laterality: Bilateral;   CYSTOSCOPY WITH STENT PLACEMENT Right 02/25/2020   Procedure: CYSTOSCOPY WITH RIGHT RETROGRADE RIGHT STENT PLACEMENT;  Surgeon: Festus Aloe, MD;  Location: [facility];  Service: Urology;  Laterality: Right;   ESOPHAGOGASTRODUODENOSCOPY (EGD) WITH PROPOFOL N/A 06/23/2020   Normal esophagus, multiple localized erosions were found in gastric antrum s/p biopsy, normal duodenum. Empiric dilation.Reactive gastropathy with erosions, negative H.pylori, no dysplasia.     EXTRACORPOREAL SHOCK WAVE LITHOTRIPSY Right 10/11/2020   Procedure: EXTRACORPOREAL SHOCK WAVE LITHOTRIPSY (ESWL);  Surgeon: Cleon Gustin, MD;  Location: [facility];  Service: Urology;  Laterality: Right;   FOOT SURGERY Right March, 2013   [facility]-removal of bone spur   HOLMIUM LASER APPLICATION  01/21/2481   Procedure: HOLMIUM LASER LITHOTRIPSY RIGHT URETERAL CALCULUS;  Surgeon: Cleon Gustin, MD;  Location: [facility];  Service: Urology;;   HYSTEROSCOPY WITH THERMACHOICE  04/25/2012   Procedure: HYSTEROSCOPY WITH THERMACHOICE;  Surgeon: Florian Buff, MD;  Location: [facility];  Service: Gynecology;  Laterality: N/A;  total therapy time= 11 minutes 42 seconds; 34 ml D5w  in and 34 ml D5w out; temp =87 degrees F   LEFT HEART CATHETERIZATION WITH CORONARY ANGIOGRAM N/A 12/11/2012   Procedure: LEFT HEART CATHETERIZATION WITH CORONARY ANGIOGRAM;  Surgeon: Lorretta Harp, MD;  Location: [facility] CATH LAB;  Service: Cardiovascular;  Laterality: N/A;   MALONEY DILATION N/A 06/23/2020   Procedure: Venia Minks DILATION;  Surgeon: Daneil Dolin, MD;  [facility];  Service: Endoscopy;  Laterality: N/A;   Sinus sergery  1988   [place]   STONE EXTRACTION WITH BASKET Right 11/08/2020   Procedure: STONE EXTRACTION WITH BASKET;  Surgeon: Cleon Gustin, MD;  Location: [facility];  Service: Urology;  Laterality: Right;   UMBILICAL HERNIA REPAIR N/A 03/25/2014   Procedure: UMBILICAL HERNIA REPAIR;  Surgeon: Scherry Ran, MD;  Location: [facility];  Service: General;  Laterality: N/A;   uterine ablation      Family History  Problem Relation Age of Onset   Cancer Mother    Diabetes Mother    Cancer Paternal Uncle    Depression Paternal Grandmother    Depression Paternal Grandfather    Heart disease Other    Cancer Other    Diabetes Other    Achalasia Other    Anesthesia problems Neg Hx    Hypotension Neg Hx    Malignant hyperthermia Neg Hx    Pseudochol deficiency Neg Hx     Social History   Tobacco Use   Smoking status: Never   Smokeless tobacco: Never  Vaping Use   Vaping Use: Never used  Substance Use Topics   Alcohol use: No   Drug use: No    ROS   Objective:   Vitals: BP (!) 148/77 (BP Location: Right Arm)   Pulse 78   Temp 97.9 F (36.6 C) (Oral)   Resp 18   LMP 07/16/2012   SpO2 95%   Physical Exam Constitutional:      General: She is not in acute distress.    Appearance: Normal appearance. She is well-developed. She is not ill-appearing, toxic-appearing or diaphoretic.  HENT:     Head: Normocephalic and  atraumatic.     Right Ear: External ear normal.     Left Ear: External ear normal.  Nose: Nose normal.     Mouth/Throat:     Mouth: Mucous membranes are moist.     Pharynx: Oropharynx is clear.  Eyes:     General: No scleral icterus.       Right eye: No discharge.        Left eye: No discharge.     Extraocular Movements: Extraocular movements intact.     Conjunctiva/sclera: Conjunctivae normal.     Pupils: Pupils are equal, round, and reactive to light.  Cardiovascular:     Rate and Rhythm: Normal rate.  Pulmonary:     Effort: Pulmonary effort is normal.  Musculoskeletal:        General: Tenderness (over lower legs bilaterally throughout and evenly) present. No swelling, deformity or signs of injury.     Right lower leg: No edema.     Left lower leg: No edema.  Skin:    General: Skin is warm and dry.     Findings: No bruising, erythema or rash.  Neurological:     General: No focal deficit present.     Mental Status: She is alert and oriented to person, place, and time.  Psychiatric:        Mood and Affect: Mood normal.        Behavior: Behavior normal.        Thought Content: Thought content normal.        Judgment: Judgment normal.   Results for orders placed or performed during the hospital encounter of 10/10/21 (from the past 72 hour(s))  CBC with Differential/Platelet     Status: None   Collection Time: 10/10/21  1:53 AM  Result Value Ref Range   WBC 8.8 4.0 - 10.5 K/uL   RBC 4.36 3.87 - 5.11 MIL/uL   Hemoglobin 12.5 12.0 - 15.0 g/dL   HCT 37.6 36.0 - 46.0 %   MCV 86.2 80.0 - 100.0 fL   MCH 28.7 26.0 - 34.0 pg   MCHC 33.2 30.0 - 36.0 g/dL   RDW 13.3 11.5 - 15.5 %   Platelets 294 150 - 400 K/uL   nRBC 0.0 0.0 - 0.2 %   Neutrophils Relative % 52 %   Neutro Abs 4.7 1.7 - 7.7 K/uL   Lymphocytes Relative 36 %   Lymphs Abs 3.2 0.7 - 4.0 K/uL   Monocytes Relative 9 %   Monocytes Absolute 0.8 0.1 - 1.0 K/uL   Eosinophils Relative 2 %   Eosinophils Absolute 0.1  0.0 - 0.5 K/uL   Basophils Relative 1 %   Basophils Absolute 0.1 0.0 - 0.1 K/uL   Immature Granulocytes 0 %   Abs Immature Granulocytes 0.02 0.00 - 0.07 K/uL    Comment: Performed at [facility], [address] [place], [place] [address]  Comprehensive metabolic panel     Status: Abnormal   Collection Time: 10/10/21  1:53 AM  Result Value Ref Range   Sodium 138 135 - 145 mmol/L   Potassium 3.4 (L) 3.5 - 5.1 mmol/L   Chloride 103 98 - 111 mmol/L   CO2 28 22 - 32 mmol/L   Glucose, Bld 107 (H) 70 - 99 mg/dL    Comment: Glucose reference range applies only to samples taken after fasting for at least 8 hours.   BUN 14 6 - 20 mg/dL   Creatinine, Ser 0.75 0.44 - 1.00 mg/dL   Calcium 8.6 (L) 8.9 - 10.3 mg/dL   Total Protein 6.8 6.5 - 8.1 g/dL   Albumin 3.6 3.5 -  5.0 g/dL   AST 21 15 - 41 U/L   ALT 22 0 - 44 U/L   Alkaline Phosphatase 96 38 - 126 U/L   Total Bilirubin 0.6 0.3 - 1.2 mg/dL   GFR, Estimated >60 >60 mL/min    Comment: (NOTE) Calculated using the CKD-EPI Creatinine Equation (2021)    Anion gap 7 5 - 15    Comment: Performed at [facility], [address] [place], [place] [address]  Troponin I (High Sensitivity)     Status: None   Collection Time: 10/10/21  1:53 AM  Result Value Ref Range   Troponin I (High Sensitivity) <2 <18 ng/L    Comment: (NOTE) Elevated high sensitivity troponin I (hsTnI) values and significant  changes across serial measurements may suggest ACS but many other  chronic and acute conditions are known to elevate hsTnI results.  Refer to the "Links" section for chest pain algorithms and additional  guidance. Performed at [facility], [address] [place], [place] [address]   Brain natriuretic peptide     Status: None   Collection Time: 10/10/21  1:53 AM  Result Value Ref Range   B Natriuretic Peptide 20.0 0.0 - 100.0 pg/mL    Comment: Performed at [facility], [address], [place], [place] [address]  D-dimer, quantitative     Status: None    Collection Time: 10/10/21  1:53 AM  Result Value Ref Range   D-Dimer, Quant 0.50 0.00 - 0.50 ug/mL-FEU    Comment: (NOTE) At the manufacturer cut-off value of 0.5 g/mL FEU, this assay has a negative predictive value of 95-100%.This assay is intended for use in conjunction with a clinical pretest probability (PTP) assessment model to exclude pulmonary embolism (PE) and deep venous thrombosis (DVT) in outpatients suspected of PE or DVT. Results should be correlated with clinical presentation. Performed at [facility], [address] [place], [place] [address]      Assessment and Plan :   PDMP not reviewed this encounter.  1. Pain in both lower legs   2. Myalgia    Lab review completed with patient.  She has never had a positive D-dimer test.  Her Wells criteria is technically low.  We will hold off on sending her to the emergency room again as there is no guarantee that she will get the ultrasound.  Recommended she contact vascular & vein specialty clinic in [place] for further evaluation including ultrasounds.  Discussed possibility of rhabdomyolysis although very unlikely given her recent labs, CK level pending.  She may also have varicose veins and recommended supportive care, compression socks.  Use of muscle relaxant, Tylenol.  Patient is not a smoker. Counseled patient on potential for adverse effects with medications prescribed/recommended today, ER and return-to-clinic precautions discussed, patient verbalized understanding.    Jaynee Eagles, PA-C 10/11/21 1735

## 2021-10-11 NOTE — ED Triage Notes (Signed)
2 week h/o lower left leg pain w/intermittent swelling. Pain is mostly anterolateral but will occur intermittently in the posterior aspect. Sitting, walking, standing and lying aggravate sxs. No falls or injuries noted. Pt notes one week h/o similar right leg sxs. Left leg pain and swelling > right.   Pt was seen at the ED yesterday for CP and leg pain. Noted DVT study was not completed.

## 2021-10-12 ENCOUNTER — Other Ambulatory Visit: Payer: Self-pay | Admitting: *Deleted

## 2021-10-12 DIAGNOSIS — M79606 Pain in leg, unspecified: Secondary | ICD-10-CM

## 2021-10-12 NOTE — Telephone Encounter (Signed)
Pt aware of appt 10/17/21

## 2021-10-13 ENCOUNTER — Ambulatory Visit (INDEPENDENT_AMBULATORY_CARE_PROVIDER_SITE_OTHER): Payer: No Typology Code available for payment source | Admitting: Physician Assistant

## 2021-10-13 ENCOUNTER — Ambulatory Visit (HOSPITAL_COMMUNITY)
Admission: RE | Admit: 2021-10-13 | Discharge: 2021-10-13 | Disposition: A | Discharge status: Home / Self Care | Payer: No Typology Code available for payment source | Source: Ambulatory Visit | Attending: Vascular Surgery | Admitting: Vascular Surgery

## 2021-10-13 ENCOUNTER — Other Ambulatory Visit: Payer: Self-pay

## 2021-10-13 VITALS — BP 140/86 | HR 71 | Temp 97.6°F | Resp 20 | Ht <= 58 in | Wt 222.1 lb

## 2021-10-13 DIAGNOSIS — M79606 Pain in leg, unspecified: Secondary | ICD-10-CM | POA: Insufficient documentation

## 2021-10-13 DIAGNOSIS — M79605 Pain in left leg: Secondary | ICD-10-CM

## 2021-10-13 DIAGNOSIS — M7989 Other specified soft tissue disorders: Secondary | ICD-10-CM

## 2021-10-13 DIAGNOSIS — I872 Venous insufficiency (chronic) (peripheral): Secondary | ICD-10-CM

## 2021-10-13 LAB — CK: Total CK: 228 U/L — ABNORMAL HIGH (ref 32–182)

## 2021-10-13 NOTE — Progress Notes (Signed)
[facility] [place]   Reason for referral: Swollen left > right  legs  History of Present Illness  [name] is a 55 y.o. female who presents with chief complaint: swollen leg.  Patient notes, onset of swelling 2 weeks ago, associated with nothing.  She states she had sudden onset of left leg swelling with pain that interferes with ambulation.  The patient has had no history of DVT, no history of varicose vein, no history of venous stasis ulcers, no history of  Lymphedema and no history of skin changes in lower legs.  There is no family history of venous disorders.  The patient has  used compression stockings in the past.  Medical history includes: DM with peripheral neuropathy, HTN, hyperlipidemia.  She denise history of claudication, non healing wounds and rest pain.   Past Medical History:  Diagnosis Date   Diabetes mellitus    insulin pump 2013   Hypercholesteremia    Hypertension    Incarcerated umbilical hernia 6/65/9935   Kidney stone    Kidney stones 08/31/2008   Qualifier: Diagnosis of  By: Jonna Munro MD, Cornelius     Neuropathy    Obesity    Obstructive sleep apnea    Palpitations    Shortness of breath     Past Surgical History:  Procedure Laterality Date   BIOPSY  06/23/2020   Procedure: BIOPSY;  Surgeon: Daneil Dolin, MD;  [facility];  Service: Endoscopy;;   CARDIAC CATHETERIZATION  12/11/2012   normal coronary arteries   CESAREAN SECTION  1994;2000   [facility]   CHOLECYSTECTOMY  1992   COLONOSCOPY WITH PROPOFOL N/A 06/23/2020   large amount of stool in entire colon, prep inadequate.    CYSTOSCOPY W/ URETERAL STENT PLACEMENT  05/08/2012   Procedure: CYSTOSCOPY WITH RETROGRADE PYELOGRAM/URETERAL STENT PLACEMENT;  Surgeon: Marissa Nestle, MD;  Location: [facility];  Service: Urology;  Laterality: Right;   CYSTOSCOPY WITH RETROGRADE PYELOGRAM, URETEROSCOPY AND STENT PLACEMENT Right 05/04/2020   Procedure: CYSTOSCOPY WITH RIGHT  RETROGRADE PYELOGRAM, RIGHT URETEROSCOPY AND RIGHT URETERAL STENT EXCHANGE;  Surgeon: Cleon Gustin, MD;  Location: [facility];  Service: Urology;  Laterality: Right;   CYSTOSCOPY WITH RETROGRADE PYELOGRAM, URETEROSCOPY AND STENT PLACEMENT Left 08/08/2020   Procedure: CYSTOSCOPY WITH RETROGRADE PYELOGRAM, URETEROSCOPY WITH LASER  AND STENT PLACEMENT;  Surgeon: Cleon Gustin, MD;  Location: [facility];  Service: Urology;  Laterality: Left;   CYSTOSCOPY WITH RETROGRADE PYELOGRAM, URETEROSCOPY AND STENT PLACEMENT Right 11/08/2020   Procedure: CYSTOSCOPY WITH RIGHT RETROGRADE PYELOGRAM, RIGHT URETEROSCOPY AND STENT PLACEMENT;  Surgeon: Cleon Gustin, MD;  Location: [facility];  Service: Urology;  Laterality: Right;   CYSTOSCOPY WITH RETROGRADE PYELOGRAM, URETEROSCOPY AND STENT PLACEMENT Left 12/27/2020   Procedure: CYSTOSCOPY WITH LEFT RETROGRADE PYELOGRAM, LEFT URETEROSCOPY AND LEFT URETERAL STENT PLACEMENT;  Surgeon: Cleon Gustin, MD;  Location: [facility];  Service: Urology;  Laterality: Left;   CYSTOSCOPY WITH RETROGRADE PYELOGRAM, URETEROSCOPY AND STENT PLACEMENT Bilateral 03/16/2021   Procedure: CYSTOSCOPY WITH BILATERAL  RETROGRADE PYELOGRAM, BILATERAL URETEROSCOPY AND STENT PLACEMENT ON THE LEFT;  Surgeon: Cleon Gustin, MD;  Location: [facility];  Service: Urology;  Laterality: Bilateral;   CYSTOSCOPY WITH STENT PLACEMENT Right 02/25/2020   Procedure: CYSTOSCOPY WITH RIGHT RETROGRADE RIGHT STENT PLACEMENT;  Surgeon: Festus Aloe, MD;  Location: [facility];  Service: Urology;  Laterality: Right;   ESOPHAGOGASTRODUODENOSCOPY (EGD) WITH PROPOFOL N/A 06/23/2020   Normal esophagus, multiple localized erosions were found in gastric antrum  s/p biopsy, normal duodenum. Empiric dilation.Reactive gastropathy with erosions, negative H.pylori, no dysplasia.     EXTRACORPOREAL SHOCK WAVE LITHOTRIPSY Right 10/11/2020   Procedure: EXTRACORPOREAL SHOCK WAVE LITHOTRIPSY (ESWL);  Surgeon: Cleon Gustin,  MD;  Location: [facility];  Service: Urology;  Laterality: Right;   FOOT SURGERY Right March, 2013   [facility]-removal of bone spur   HOLMIUM LASER APPLICATION  8/54/6270   Procedure: HOLMIUM LASER LITHOTRIPSY RIGHT URETERAL CALCULUS;  Surgeon: Cleon Gustin, MD;  Location: [facility];  Service: Urology;;   HYSTEROSCOPY WITH THERMACHOICE  04/25/2012   Procedure: HYSTEROSCOPY WITH THERMACHOICE;  Surgeon: Florian Buff, MD;  Location: [facility];  Service: Gynecology;  Laterality: N/A;  total therapy time= 11 minutes 42 seconds; 34 ml D5w  in and 34 ml D5w out; temp =87 degrees F   LEFT HEART CATHETERIZATION WITH CORONARY ANGIOGRAM N/A 12/11/2012   Procedure: LEFT HEART CATHETERIZATION WITH CORONARY ANGIOGRAM;  Surgeon: Lorretta Harp, MD;  Location: [facility] CATH LAB;  Service: Cardiovascular;  Laterality: N/A;   MALONEY DILATION N/A 06/23/2020   Procedure: Venia Minks DILATION;  Surgeon: Daneil Dolin, MD;  [facility];  Service: Endoscopy;  Laterality: N/A;   Sinus sergery  1988   [place]   STONE EXTRACTION WITH BASKET Right 11/08/2020   Procedure: STONE EXTRACTION WITH BASKET;  Surgeon: Cleon Gustin, MD;  Location: [facility];  Service: Urology;  Laterality: Right;   UMBILICAL HERNIA REPAIR N/A 03/25/2014   Procedure: UMBILICAL HERNIA REPAIR;  Surgeon: Scherry Ran, MD;  Location: [facility];  Service: General;  Laterality: N/A;   uterine ablation      Social History   Socioeconomic History   Marital status: Married    Spouse name: Not on file   Number of children: 2   Years of education: college   Highest education level: Not on file  Occupational History   Occupation: CNA    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: [employer]   Occupation: Quarry manager- at [employer]  Tobacco Use   Smoking status: Never   Smokeless tobacco: Never  Vaping Use   Vaping Use: Never used  Substance and Sexual Activity   Alcohol use: No   Drug use: No   Sexual activity: Yes     Partners: Male    Comment: ablation  Other Topics Concern   Not on file  Social History Narrative   Regular exercise: walks Caffeine use:    Social Determinants of Health   Financial Resource Strain: Not on file  Food Insecurity: Not on file  Transportation Needs: Not on file  Physical Activity: Not on file  Stress: Not on file  Social Connections: Not on file  Intimate Partner Violence: Not on file    Family History  Problem Relation Age of Onset   Cancer Mother    Diabetes Mother    Cancer Paternal Uncle    Depression Paternal Grandmother    Depression Paternal Grandfather    Heart disease Other    Cancer Other    Diabetes Other    Achalasia Other    Anesthesia problems Neg Hx    Hypotension Neg Hx    Malignant hyperthermia Neg Hx    Pseudochol deficiency Neg Hx     Current Outpatient Medications on File Prior to Visit  Medication Sig Dispense Refill   albuterol (VENTOLIN HFA) 108 (90 Base) MCG/ACT inhaler Inhale 2 puffs into the lungs every 6 (six) hours as needed for  wheezing or shortness of breath.      ALPRAZolam (XANAX) 0.5 MG tablet Take 0.5 mg by mouth 2 (two) times daily as needed.     ARIPiprazole (ABILIFY) 2 MG tablet Take 2 mg by mouth at bedtime.      blood glucose meter kit and supplies Use up to four times daily as directed. 1 each 0   desvenlafaxine (PRISTIQ) 50 MG 24 hr tablet Take 50 mg by mouth daily.     diphenoxylate-atropine (LOMOTIL) 2.5-0.025 MG tablet Take 1 tablet by mouth 4 (four) times daily as needed for diarrhea or loose stools. 20 tablet 0   Furosemide (LASIX PO) Take 1 tablet by mouth 2 (two) times daily.     gabapentin (NEURONTIN) 600 MG tablet Take 600 mg by mouth 3 (three) times daily.     glucose blood test strip Test up to 4 times a day 100 each 0   HUMALOG 100 UNIT/ML injection Inject 3.5 Units into the skin continuous. 3.5 units every 1 hr-insulin pump     hydrocortisone 2.5 % cream APPLY A THIN LAYER TO THE AFFECTED AREA(S) BY  TOPICAL ROUTE 2 TIMES PER DAY 20 g 0   ibuprofen (ADVIL) 800 MG tablet Take 1 tablet (800 mg total) by mouth 3 (three) times daily. 21 tablet 0   insulin lispro (HUMALOG) 100 UNIT/ML injection Inject 80 units under the skin via omnipod daily 40 mL 0   Lancets (FREESTYLE) lancets Use to test 4 times daily 100 each 0   omeprazole (PRILOSEC) 40 MG capsule Take 30- 60 min before your first and last meals of the day     ondansetron (ZOFRAN) 4 MG tablet Take 1 tablet (4 mg total) by mouth every 8 (eight) hours as needed for nausea or vomiting. 20 tablet 0   oxybutynin (DITROPAN) 5 MG tablet Take 5 mg by mouth at bedtime.      Potassium 99 MG TABS Take 99 mg by mouth daily.     simvastatin (ZOCOR) 40 MG tablet Take 40 mg by mouth daily.      tirzepatide [facility]) 2.5 MG/0.5ML Pen Inject 2.5 mg into the skin once a week. 2 mL 0   tirzepatide (MOUNJARO) 5 MG/0.5ML Pen Inject 5 mg into the skin once a week. 6 mL 0   cyclobenzaprine (FLEXERIL) 5 MG tablet Take 1 tablet (5 mg total) by mouth 3 (three) times daily as needed for muscle spasms. (Patient not taking: Reported on 10/13/2021) 30 tablet 0   linaclotide (LINZESS) 145 MCG CAPS capsule Take 1 capsule (145 mcg total) by mouth daily before breakfast. (Patient not taking: Reported on 10/13/2021) 90 capsule 3   methocarbamol (ROBAXIN) 500 MG tablet Take 1 tablet (500 mg total) by mouth at bedtime as needed for muscle spasms. (Patient not taking: Reported on 10/13/2021) 20 tablet 0   olmesartan (BENICAR) 20 MG tablet One tablet daily (Patient not taking: Reported on 10/13/2021)     No current facility-administered medications on file prior to visit.    Allergies as of 10/13/2021 - Review Complete 10/13/2021  Allergen Reaction Noted   Ciprofloxacin Hives and Swelling 04/25/2012   Penicillins Anaphylaxis and Rash 08/31/2008   Codeine Other (See Comments) 08/31/2008   Morphine Nausea And Vomiting and Other (See Comments) 08/31/2008   Sulfonamide derivatives  Other (See Comments) 08/31/2008   Vancomycin Itching 02/19/2014     ROS:   General:  No weight loss, Fever, chills  HEENT: No recent headaches, no nasal bleeding, no visual  changes, no sore throat  Neurologic: No dizziness, blackouts, seizures. No recent symptoms of stroke or mini- stroke. No recent episodes of slurred speech, or temporary blindness.  Cardiac: No recent episodes of chest pain/pressure, no shortness of breath at rest.  No shortness of breath with exertion.  Denies history of atrial fibrillation or irregular heartbeat  Vascular: No history of rest pain in feet.  No history of claudication.  No history of non-healing ulcer, No history of DVT   Pulmonary: No home oxygen, no productive cough, no hemoptysis,  No asthma or wheezing  Musculoskeletal:  [ ] Arthritis, [ ] Low back pain,  [ ] Joint pain  Hematologic:No history of hypercoagulable state.  No history of easy bleeding.  No history of anemia  Gastrointestinal: No hematochezia or melena,  No gastroesophageal reflux, no trouble swallowing  Urinary: [ ] chronic Kidney disease, [ ] on HD - [ ] MWF or [ ] TTHS, [ ] Burning with urination, [ ] Frequent urination, [ ] Difficulty urinating;   Skin: No rashes  Psychological: No history of anxiety,  No history of depression  Physical Examination  Vitals:   10/13/21 0912  BP: 140/86  Pulse: 71  Resp: 20  Temp: 97.6 F (36.4 C)  TempSrc: Temporal  SpO2: 98%  Weight: 222 lb 1.6 oz (100.7 kg)  Height: 4' 8" (1.422 m)    Body mass index is 49.79 kg/m.  General:  Alert and oriented, no acute distress HEENT: Normal Neck: No bruit or JVD Pulmonary: Clear to auscultation bilaterally Cardiac: Regular Rate and Rhythm without murmur Abdomen: Soft, non-tender, non-distended, no mass, no scars Skin: No rash Extremity Pulses:  2+ radial, brachial, femoral, dorsalis pedis,  pulses bilaterally Musculoskeletal: No deformity or edema  Neurologic: Upper and lower  extremity motor 5/5 and symmetric  DATA:    +--------------+---------+------+-----------+------------+--------+  LEFT          Reflux NoRefluxReflux TimeDiameter cmsComments                          Yes                                   +--------------+---------+------+-----------+------------+--------+  CFV                     yes   >1 second                       +--------------+---------+------+-----------+------------+--------+  FV mid        no                                              +--------------+---------+------+-----------+------------+--------+  Popliteal     no                                              +--------------+---------+------+-----------+------------+--------+  GSV at SFJ              yes    >500 ms     0.702              +--------------+---------+------+-----------+------------+--------+  GSV prox thigh  yes    >500 ms     0.265              +--------------+---------+------+-----------+------------+--------+  GSV mid thigh no                           0.377              +--------------+---------+------+-----------+------------+--------+  GSV dist thighno                           0.404              +--------------+---------+------+-----------+------------+--------+  GSV at knee   no                           0.361              +--------------+---------+------+-----------+------------+--------+  GSV prox calf           yes                0.297              +--------------+---------+------+-----------+------------+--------+  SSV Pop Fossa no                           0.233              +--------------+---------+------+-----------+------------+--------+  SSV prox calf no                            0.17              +--------------+---------+------+-----------+------------+--------+  SSV mid calf  no                           0.138               +--------------+---------+------+-----------+------------+--------+      Summary:  Left:  - No evidence of deep vein thrombosis seen in the left lower extremity,  from the common femoral through the popliteal veins.  - Non occlusive chronic changes seen in the small saphenous vein.  - Deep vein reflux in the common femoral vein.  - Superficial vein reflux in the sapheno-femoral junction and great  saphenous vein.   Assessment: Mild reflux out of proportion to pain and swelling symptoms.   The venous duplex is negative for DVT.  She has reflux at the [facility], but no significant increase in vein size or reflux through out the thigh GSV.  She has palpable DP pulse B without evidence of ischemia.    Plan: Elevation , compression with thigh high 20-30 mmhg, and  exercise.  Her pain has come on suddenly without trauma.  No clear indication that her pain is due to diseased vasculature.  F/U PRN  Roxy Horseman PA-C [facility] [place] Office: [phone]  MD in office [place]

## 2021-10-17 ENCOUNTER — Other Ambulatory Visit: Payer: Self-pay

## 2021-10-17 ENCOUNTER — Encounter: Payer: Self-pay | Admitting: Family Medicine

## 2021-10-17 ENCOUNTER — Ambulatory Visit (INDEPENDENT_AMBULATORY_CARE_PROVIDER_SITE_OTHER): Payer: No Typology Code available for payment source | Admitting: Family Medicine

## 2021-10-17 VITALS — BP 129/83 | HR 87 | Resp 16 | Ht <= 58 in | Wt 221.0 lb

## 2021-10-17 DIAGNOSIS — I10 Essential (primary) hypertension: Secondary | ICD-10-CM | POA: Diagnosis not present

## 2021-10-17 DIAGNOSIS — M5442 Lumbago with sciatica, left side: Secondary | ICD-10-CM

## 2021-10-17 DIAGNOSIS — G629 Polyneuropathy, unspecified: Secondary | ICD-10-CM | POA: Diagnosis not present

## 2021-10-17 DIAGNOSIS — E1165 Type 2 diabetes mellitus with hyperglycemia: Secondary | ICD-10-CM

## 2021-10-17 DIAGNOSIS — G8929 Other chronic pain: Secondary | ICD-10-CM

## 2021-10-17 MED ORDER — GABAPENTIN 600 MG PO TABS: ORAL_TABLET | ORAL | 1 refills | Status: DC

## 2021-10-17 NOTE — Patient Instructions (Signed)
F/u as before, call if you need to be seen sooner  Please get X ary of your low back asap, we will be in touch with result, if abn will  get MRI of low back, as I think a lot of the pain in the legs is from the back  Increase bedtime gabapentin to TWo  tablets, an d continue one twice daily as before  Thanks for choosing [facility], we consider it a privelige to serve you.

## 2021-10-17 NOTE — Progress Notes (Addendum)
OTA EBERSOLE     MRN: [id number]      DOB: Dec 03, 1966   HPI [name] is here  c/o 1 month h/o uncontrolled bilateral leg pain  rated at 10, stiffness and pain when trying to get , keeps her up and awakens her C/o uncontrolled and fluctuating blood sugars ROS Denies recent fever or chills. Denies sinus pressure, nasal congestion, ear pain or sore throat. Denies chest congestion, productive cough or wheezing. Denies chest pains, palpitations and leg swelling Denies abdominal pain, nausea, vomiting,diarrhea or constipation.   Denies dysuria, frequency, hesitancy or incontinence.  Denies headaches, seizures, numbness, or tingling. Denies depression, anxiety or insomnia. Denies skin break down or rash.   PE  BP 129/83   Pulse 87   Resp 16   Ht 4\' 8"  (1.422 m)   Wt 221 lb (100.2 kg)   LMP 07/16/2012   SpO2 96%   BMI 49.55 kg/m   Patient alert and oriented and in no cardiopulmonary distress.  HEENT: No facial asymmetry, EOMI,     Neck supple .  Chest: Clear to auscultation bilaterally.  CVS: S1, S2 no murmurs, no S3.Regular rate.  ABD: Soft non tender.   Ext: No edema Decreased  ROM spine,adequate in  shoulders, hips and knees.  Skin: Intact, no ulcerations or rash noted.  Psych: Good eye contact, normal affect. Memory intact not anxious or depressed appearing.  CNS: CN 2-12 intact, power,  normal throughout.no focal deficits noted.   Assessment & Plan  Chronic bilateral low back pain with left-sided sciatica Increase bedtime gabapentin to 200 mg, needs xray and likely MRI of lower back due to extent of symptom severity  Essential hypertension DASH diet and commitment to daily physical activity for a minimum of 30 minutes discussed and encouraged, as a part of hypertension management. The importance of attaining a healthy weight is also discussed.  BP/Weight 10/17/2021 10/13/2021 10/11/2021 10/10/2021 09/26/2021 08/30/2021 08/12/2021  Systolic BP 129 140 148 129 130  128 138  Diastolic BP 83 86 77 66 70 71 78  Wt. (Lbs) 221 222.1 - 210 225.6 228 -  BMI 49.55 49.79 - 47.08 50.58 49.34 -    Controlled, no change in medication    Morbid obesity due to excess calories [facility])  Patient re-educated about  the importance of commitment to a  minimum of 150 minutes of exercise per week as able.  The importance of healthy food choices with portion control discussed, as well as eating regularly and within a 12 hour window most days. The need to choose "clean , green" food 50 to 75% of the time is discussed, as well as to make water the primary drink and set a goal of 64 ounces water daily.    Weight /BMI 10/17/2021 10/13/2021 10/10/2021  WEIGHT 221 lb 222 lb 1.6 oz 210 lb  HEIGHT 4\' 8"  4\' 8"  4\' 8"   BMI 49.55 kg/m2 49.79 kg/m2 47.08 kg/m2      Neuropathy Increasingly debilitating leg pain, inc gabapentin and get imaging done  Type 2 diabetes mellitus with hyperglycemia (HCC) [name] is reminded of the importance of commitment to daily physical activity for 30 minutes or more, as able and the need to limit carbohydrate intake to 30 to 60 grams per meal to help with blood sugar control.   The need to take medication as prescribed, test blood sugar as directed, and to call between visits if there is a concern that blood sugar is uncontrolled is also  discussed.   [name] is reminded of the importance of daily foot exam, annual eye examination, and good blood sugar, blood pressure and cholesterol control.  Diabetic Labs Latest Ref Rng & Units 10/10/2021 09/21/2021 08/30/2021 06/23/2021 03/15/2021  HbA1c 4.8 - 5.6 % - - 10.2(H) - 7.0(H)  Microalbumin 0.00 - 1.89 mg/dL - - - - -  Micro/Creat Ratio 0 - 29 mg/g creat - - 7 - -  Chol 100 - 199 mg/dL - - 676 - -  HDL >72 mg/dL - - 40 - -  Calc LDL 0 - 99 mg/dL - - 094(B) - -  Triglycerides 0 - 149 mg/dL - - 096(G) - -  Creatinine 0.44 - 1.00 mg/dL 8.36 6.29 4.76 5.46 -   BP/Weight 10/17/2021 10/13/2021 10/11/2021  10/10/2021 09/26/2021 08/30/2021 08/12/2021  Systolic BP 129 140 148 129 130 128 138  Diastolic BP 83 86 77 66 70 71 78  Wt. (Lbs) 221 222.1 - 210 225.6 228 -  BMI 49.55 49.79 - 47.08 50.58 49.34 -   Foot/eye exam completion dates 07/15/2013  Foot Form Completion Done   Uncontrolled f/u with PCP as befiore

## 2021-10-18 ENCOUNTER — Encounter: Payer: Self-pay | Admitting: Family Medicine

## 2021-10-18 ENCOUNTER — Ambulatory Visit: Payer: No Typology Code available for payment source | Admitting: Cardiology

## 2021-10-18 ENCOUNTER — Ambulatory Visit (HOSPITAL_COMMUNITY)
Admission: RE | Admit: 2021-10-18 | Discharge: 2021-10-18 | Disposition: A | Discharge status: Home / Self Care | Payer: No Typology Code available for payment source | Source: Ambulatory Visit | Attending: Family Medicine | Admitting: Family Medicine

## 2021-10-18 DIAGNOSIS — G8929 Other chronic pain: Secondary | ICD-10-CM | POA: Insufficient documentation

## 2021-10-18 DIAGNOSIS — M5442 Lumbago with sciatica, left side: Secondary | ICD-10-CM | POA: Diagnosis present

## 2021-10-18 NOTE — Assessment & Plan Note (Signed)
Increasingly debilitating leg pain, inc gabapentin and get imaging done

## 2021-10-18 NOTE — Assessment & Plan Note (Signed)
[name] is reminded of the importance of commitment to daily physical activity for 30 minutes or more, as able and the need to limit carbohydrate intake to 30 to 60 grams per meal to help with blood sugar control.   The need to take medication as prescribed, test blood sugar as directed, and to call between visits if there is a concern that blood sugar is uncontrolled is also discussed.   [name] is reminded of the importance of daily foot exam, annual eye examination, and good blood sugar, blood pressure and cholesterol control.  Diabetic Labs Latest Ref Rng & Units 10/10/2021 09/21/2021 08/30/2021 06/23/2021 03/15/2021  HbA1c 4.8 - 5.6 % - - 10.2(H) - 7.0(H)  Microalbumin 0.00 - 1.89 mg/dL - - - - -  Micro/Creat Ratio 0 - 29 mg/g creat - - 7 - -  Chol 100 - 199 mg/dL - - 376 - -  HDL >28 mg/dL - - 40 - -  Calc LDL 0 - 99 mg/dL - - 315(V) - -  Triglycerides 0 - 149 mg/dL - - 761(Y) - -  Creatinine 0.44 - 1.00 mg/dL 0.73 7.10 6.26 9.48 -   BP/Weight 10/17/2021 10/13/2021 10/11/2021 10/10/2021 09/26/2021 08/30/2021 08/12/2021  Systolic BP 129 140 148 129 130 128 138  Diastolic BP 83 86 77 66 70 71 78  Wt. (Lbs) 221 222.1 - 210 225.6 228 -  BMI 49.55 49.79 - 47.08 50.58 49.34 -   Foot/eye exam completion dates 07/15/2013  Foot Form Completion Done   Uncontrolled f/u with PCP as befiore

## 2021-10-18 NOTE — Assessment & Plan Note (Signed)
  Patient re-educated about  the importance of commitment to a  minimum of 150 minutes of exercise per week as able.  The importance of healthy food choices with portion control discussed, as well as eating regularly and within a 12 hour window most days. The need to choose "clean , green" food 50 to 75% of the time is discussed, as well as to make water the primary drink and set a goal of 64 ounces water daily.    Weight /BMI 10/17/2021 10/13/2021 10/10/2021  WEIGHT 221 lb 222 lb 1.6 oz 210 lb  HEIGHT 4\' 8"  4\' 8"  4\' 8"   BMI 49.55 kg/m2 49.79 kg/m2 47.08 kg/m2

## 2021-10-18 NOTE — Assessment & Plan Note (Signed)
DASH diet and commitment to daily physical activity for a minimum of 30 minutes discussed and encouraged, as a part of hypertension management. The importance of attaining a healthy weight is also discussed.  BP/Weight 10/17/2021 10/13/2021 10/11/2021 10/10/2021 09/26/2021 08/30/2021 08/12/2021  Systolic BP 129 140 148 129 130 128 138  Diastolic BP 83 86 77 66 70 71 78  Wt. (Lbs) 221 222.1 - 210 225.6 228 -  BMI 49.55 49.79 - 47.08 50.58 49.34 -    Controlled, no change in medication

## 2021-10-18 NOTE — Assessment & Plan Note (Signed)
Increase bedtime gabapentin to 200 mg, needs xray and likely MRI of lower back due to extent of symptom severity

## 2021-10-19 ENCOUNTER — Telehealth: Payer: Self-pay

## 2021-10-19 NOTE — Telephone Encounter (Signed)
Patient seen on her mychart test results ask for nurse to give her a call back at [phone]

## 2021-10-19 NOTE — Telephone Encounter (Signed)
Patient called about back xrays results. Call back # [phone].

## 2021-10-20 ENCOUNTER — Other Ambulatory Visit: Payer: Self-pay

## 2021-10-20 DIAGNOSIS — G8929 Other chronic pain: Secondary | ICD-10-CM

## 2021-10-20 NOTE — Telephone Encounter (Signed)
Pt states pain is getting worse, it is almost unbearable.  Pt missed worked on 10/19/2021 due to so much pain.  Pt states she was already taking gabapentin for neuropathy and the increased dosage is not helping.  What should she do next?

## 2021-10-20 NOTE — Telephone Encounter (Signed)
Pt wants to know if you think that she will eventually need surgery to fix this disc problem with her back.

## 2021-10-22 ENCOUNTER — Other Ambulatory Visit: Payer: Self-pay | Admitting: Family Medicine

## 2021-10-25 ENCOUNTER — Encounter: Payer: Self-pay | Admitting: Internal Medicine

## 2021-10-25 ENCOUNTER — Ambulatory Visit (INDEPENDENT_AMBULATORY_CARE_PROVIDER_SITE_OTHER): Payer: No Typology Code available for payment source | Admitting: Internal Medicine

## 2021-10-25 ENCOUNTER — Other Ambulatory Visit: Payer: Self-pay

## 2021-10-25 DIAGNOSIS — M48062 Spinal stenosis, lumbar region with neurogenic claudication: Secondary | ICD-10-CM | POA: Insufficient documentation

## 2021-10-25 MED ORDER — TRAMADOL HCL 50 MG PO TABS
50.0000 mg | ORAL_TABLET | Freq: Two times a day (BID) | ORAL | 0 refills | Status: DC | PRN
Start: 2021-10-25 — End: 2021-12-01

## 2021-10-25 NOTE — Patient Instructions (Addendum)
Please take Tramadol for moderate to severe back pain. Okay to take Tylenol in between for mild pain.  Please take Flexeril for muscle spasms.  You are being referred to Spine surgery.

## 2021-10-25 NOTE — Assessment & Plan Note (Signed)
Severe low back pain radiating to bilateral legs X-ray lumbar spine reviewed Check MRI of lumbar spine Referred to spine surgery Flexeril as needed, referred Started tramadol 50 mg twice daily as needed for moderate to severe pain Unable to give steroids due to uncontrolled type II DM for now

## 2021-10-25 NOTE — Progress Notes (Signed)
Virtual Visit via Telephone Note   This visit type was conducted due to national recommendations for restrictions regarding the COVID-19 Pandemic (e.g. social distancing) in an effort to limit this patient's exposure and mitigate transmission in our community.  Due to her co-morbid illnesses, this patient is at least at moderate risk for complications without adequate follow up.  This format is felt to be most appropriate for this patient at this time.  The patient did not have access to video technology/had technical difficulties with video requiring transitioning to audio format only (telephone).  All issues noted in this document were discussed and addressed.  No physical exam could be performed with this format.   Evaluation Performed:  Follow-up visit  Date:  10/25/2021   ID:  [name], DOB [birth year], MRN [id number]  Patient Location: Home Provider Location: Office/Clinic  Participants: Patient Location of Patient: Home Location of Provider: Telehealth Consent was obtain for visit to be over via telehealth. I verified that I am speaking with the correct person using two identifiers.  PCP:  [name], NP   Chief Complaint: Bilateral leg pain  History of Present Illness:    [name] is a 55 y.o. female who has a televisit for complaint of bilateral leg pain, worse for last 2 to 3 weeks.  She went to ER and urgent care and was referred to vascular surgery from there.  US Doppler was negative for DVT.  She was advised leg elevation and compression socks for leg swelling.  She was seen by Dr. Moshe Cipro about a week ago for chronic low back pain and leg pain, and x-ray lumbar spine was obtained, which showed lumbar disc degeneration and disc narrowing.  She has worsening of leg pain since then and has been more difficult for her to walk in her home.  Her husband has to help her move around the room.  She also has intermittent numbness of the legs.  Denies any saddle  anesthesia or urinary or stool incontinence.  She has missed work due to leg pain.  She has been taking gabapentin for low back pain.  The patient does not have symptoms concerning for COVID-19 infection (fever, chills, cough, or new shortness of breath).   Past Medical, Surgical, Social History, Allergies, and Medications have been Reviewed.  Past Medical History:  Diagnosis Date   Diabetes mellitus    insulin pump 2013   Hypercholesteremia    Hypertension    Incarcerated umbilical hernia 03/18/8118   Kidney stone    Kidney stones 08/31/2008   Qualifier: Diagnosis of  By: Jonna Munro MD, Cornelius     Neuropathy    Obesity    Obstructive sleep apnea    Palpitations    Shortness of breath    Past Surgical History:  Procedure Laterality Date   BIOPSY  06/23/2020   Procedure: BIOPSY;  Surgeon: Daneil Dolin, MD;  [facility];  Service: Endoscopy;;   CARDIAC CATHETERIZATION  12/11/2012   normal coronary arteries   CESAREAN SECTION  1994;2000   [facility]   CHOLECYSTECTOMY  1992   COLONOSCOPY WITH PROPOFOL N/A 06/23/2020   large amount of stool in entire colon, prep inadequate.    CYSTOSCOPY W/ URETERAL STENT PLACEMENT  05/08/2012   Procedure: CYSTOSCOPY WITH RETROGRADE PYELOGRAM/URETERAL STENT PLACEMENT;  Surgeon: Marissa Nestle, MD;  Location: [facility];  Service: Urology;  Laterality: Right;   CYSTOSCOPY WITH RETROGRADE PYELOGRAM, URETEROSCOPY AND STENT PLACEMENT Right 05/04/2020  Procedure: CYSTOSCOPY WITH RIGHT RETROGRADE PYELOGRAM, RIGHT URETEROSCOPY AND RIGHT URETERAL STENT EXCHANGE;  Surgeon: Cleon Gustin, MD;  Location: [facility];  Service: Urology;  Laterality: Right;   CYSTOSCOPY WITH RETROGRADE PYELOGRAM, URETEROSCOPY AND STENT PLACEMENT Left 08/08/2020   Procedure: CYSTOSCOPY WITH RETROGRADE PYELOGRAM, URETEROSCOPY WITH LASER  AND STENT PLACEMENT;  Surgeon: Cleon Gustin, MD;  Location: [facility];  Service: Urology;  Laterality: Left;   CYSTOSCOPY WITH  RETROGRADE PYELOGRAM, URETEROSCOPY AND STENT PLACEMENT Right 11/08/2020   Procedure: CYSTOSCOPY WITH RIGHT RETROGRADE PYELOGRAM, RIGHT URETEROSCOPY AND STENT PLACEMENT;  Surgeon: Cleon Gustin, MD;  Location: [facility];  Service: Urology;  Laterality: Right;   CYSTOSCOPY WITH RETROGRADE PYELOGRAM, URETEROSCOPY AND STENT PLACEMENT Left 12/27/2020   Procedure: CYSTOSCOPY WITH LEFT RETROGRADE PYELOGRAM, LEFT URETEROSCOPY AND LEFT URETERAL STENT PLACEMENT;  Surgeon: Cleon Gustin, MD;  Location: [facility];  Service: Urology;  Laterality: Left;   CYSTOSCOPY WITH RETROGRADE PYELOGRAM, URETEROSCOPY AND STENT PLACEMENT Bilateral 03/16/2021   Procedure: CYSTOSCOPY WITH BILATERAL  RETROGRADE PYELOGRAM, BILATERAL URETEROSCOPY AND STENT PLACEMENT ON THE LEFT;  Surgeon: Cleon Gustin, MD;  Location: [facility];  Service: Urology;  Laterality: Bilateral;   CYSTOSCOPY WITH STENT PLACEMENT Right 02/25/2020   Procedure: CYSTOSCOPY WITH RIGHT RETROGRADE RIGHT STENT PLACEMENT;  Surgeon: Festus Aloe, MD;  Location: [facility];  Service: Urology;  Laterality: Right;   ESOPHAGOGASTRODUODENOSCOPY (EGD) WITH PROPOFOL N/A 06/23/2020   Normal esophagus, multiple localized erosions were found in gastric antrum s/p biopsy, normal duodenum. Empiric dilation.Reactive gastropathy with erosions, negative H.pylori, no dysplasia.     EXTRACORPOREAL SHOCK WAVE LITHOTRIPSY Right 10/11/2020   Procedure: EXTRACORPOREAL SHOCK WAVE LITHOTRIPSY (ESWL);  Surgeon: Cleon Gustin, MD;  Location: [facility];  Service: Urology;  Laterality: Right;   FOOT SURGERY Right March, 2013   [facility]-removal of bone spur   HOLMIUM LASER APPLICATION  3/89/3734   Procedure: HOLMIUM LASER LITHOTRIPSY RIGHT URETERAL CALCULUS;  Surgeon: Cleon Gustin, MD;  Location: [facility];  Service: Urology;;   HYSTEROSCOPY WITH THERMACHOICE  04/25/2012   Procedure: HYSTEROSCOPY WITH THERMACHOICE;  Surgeon: Florian Buff, MD;  Location: [facility];  Service:  Gynecology;  Laterality: N/A;  total therapy time= 11 minutes 42 seconds; 34 ml D5w  in and 34 ml D5w out; temp =87 degrees F   LEFT HEART CATHETERIZATION WITH CORONARY ANGIOGRAM N/A 12/11/2012   Procedure: LEFT HEART CATHETERIZATION WITH CORONARY ANGIOGRAM;  Surgeon: Lorretta Harp, MD;  Location: [facility] C[facility];  Service: Cardiovascular;  Laterality: N/A;   MALONEY DILATION N/A 06/23/2020   Procedure: Venia Minks DILATION;  Surgeon: Daneil Dolin, MD;  [facility];  Service: Endoscopy;  Laterality: N/A;   Sinus sergery  1988   [place]   STONE EXTRACTION WITH BASKET Right 11/08/2020   Procedure: STONE EXTRACTION WITH BASKET;  Surgeon: Cleon Gustin, MD;  Location: [facility];  Service: Urology;  Laterality: Right;   UMBILICAL HERNIA REPAIR N/A 03/25/2014   Procedure: UMBILICAL HERNIA REPAIR;  Surgeon: Scherry Ran, MD;  Location: [facility];  Service: General;  Laterality: N/A;   uterine ablation       Current Meds  Medication Sig   albuterol (VENTOLIN HFA) 108 (90 Base) MCG/ACT inhaler Inhale 2 puffs into the lungs every 6 (six) hours as needed for wheezing or shortness of breath.    ALPRAZolam (XANAX) 0.5 MG tablet Take 0.5 mg by mouth 2 (two) times daily as needed.   ARIPiprazole (ABILIFY) 2 MG tablet Take 2  mg by mouth at bedtime.    blood glucose meter kit and supplies Use up to four times daily as directed.   cyclobenzaprine (FLEXERIL) 5 MG tablet Take 1 tablet (5 mg total) by mouth 3 (three) times daily as needed for muscle spasms.   desvenlafaxine (PRISTIQ) 50 MG 24 hr tablet Take 50 mg by mouth daily.   diphenoxylate-atropine (LOMOTIL) 2.5-0.025 MG tablet Take 1 tablet by mouth 4 (four) times daily as needed for diarrhea or loose stools.   Furosemide (LASIX PO) Take 1 tablet by mouth 2 (two) times daily.   gabapentin (NEURONTIN) 600 MG tablet Take one tablet by mouth twice daily , and take two at bedtime   glucose blood test strip Test up to 4 times a day   HUMALOG 100  UNIT/ML injection Inject 3.5 Units into the skin continuous. 3.5 units every 1 hr-insulin pump   hydrocortisone 2.5 % cream APPLY A THIN LAYER TO THE AFFECTED AREA(S) BY TOPICAL ROUTE 2 TIMES PER DAY   ibuprofen (ADVIL) 800 MG tablet Take 1 tablet (800 mg total) by mouth 3 (three) times daily.   insulin lispro (HUMALOG) 100 UNIT/ML injection Inject 80 units under the skin via omnipod daily   Lancets (FREESTYLE) lancets Use to test 4 times daily   linaclotide (LINZESS) 145 MCG CAPS capsule Take 1 capsule (145 mcg total) by mouth daily before breakfast.   methocarbamol (ROBAXIN) 500 MG tablet Take 1 tablet (500 mg total) by mouth at bedtime as needed for muscle spasms.   olmesartan (BENICAR) 20 MG tablet One tablet daily   omeprazole (PRILOSEC) 40 MG capsule TAKE 1 CAPSULE(40 MG) BY MOUTH DAILY   ondansetron (ZOFRAN) 4 MG tablet Take 1 tablet (4 mg total) by mouth every 8 (eight) hours as needed for nausea or vomiting.   oxybutynin (DITROPAN) 5 MG tablet Take 5 mg by mouth at bedtime.    Potassium 99 MG TABS Take 99 mg by mouth daily.   simvastatin (ZOCOR) 40 MG tablet Take 40 mg by mouth daily.    tirzepatide [facility]) 2.5 MG/0.5ML Pen Inject 2.5 mg into the skin once a week.   tirzepatide [facility]) 5 MG/0.5ML Pen Inject 5 mg into the skin once a week.   traMADol (ULTRAM) 50 MG tablet Take 1 tablet (50 mg total) by mouth every 12 (twelve) hours as needed for moderate pain or severe pain.     Allergies:   Ciprofloxacin, Penicillins, Codeine, Morphine, Sulfonamide derivatives, and Vancomycin   ROS:   Please see the history of present illness.     All other systems reviewed and are negative.   Labs/Other Tests and Data Reviewed:    Recent Labs: 10/10/2021: ALT 22; B Natriuretic Peptide 20.0; BUN 14; Creatinine, Ser 0.75; Hemoglobin 12.5; Platelets 294; Potassium 3.4; Sodium 138   Recent Lipid Panel Lab Results  Component Value Date/Time   CHOL 186 08/30/2021 09:43 AM   TRIG 190 (H)  08/30/2021 09:43 AM   HDL 40 08/30/2021 09:43 AM   CHOLHDL 4.0 07/20/2013 11:00 AM   LDLCALC 113 (H) 08/30/2021 09:43 AM    Wt Readings from Last 3 Encounters:  10/17/21 221 lb (100.2 kg)  10/13/21 222 lb 1.6 oz (100.7 kg)  10/10/21 210 lb (95.3 kg)      ASSESSMENT & PLAN:    Spinal stenosis of lumbar region with neurogenic claudication Severe low back pain radiating to bilateral legs X-ray lumbar spine reviewed Check MRI of lumbar spine Referred to spine surgery Flexeril as needed,  referred Started tramadol 50 mg twice daily as needed for moderate to severe pain Unable to give steroids due to uncontrolled type II DM for now   Time:   Today, I have spent 16 minutes reviewing the chart, including problem list, medications, and with the patient with telehealth technology discussing the above problems.   Medication Adjustments/Labs and Tests Ordered: Current medicines are reviewed at length with the patient today.  Concerns regarding medicines are outlined above.   Tests Ordered: Orders Placed This Encounter  Procedures   Ambulatory referral to Spine Surgery    Medication Changes: Meds ordered this encounter  Medications   traMADol (ULTRAM) 50 MG tablet    Sig: Take 1 tablet (50 mg total) by mouth every 12 (twelve) hours as needed for moderate pain or severe pain.    Dispense:  20 tablet    Refill:  0     Note: This dictation was prepared with Dragon dictation along with smaller phrase technology. Similar sounding words can be transcribed inadequately or may not be corrected upon review. Any transcriptional errors that result from this process are unintentional.      Disposition:  Follow up  Signed, Lindell Spar, MD  10/25/2021 1:28 PM     [facility]

## 2021-10-26 ENCOUNTER — Other Ambulatory Visit: Payer: Self-pay | Admitting: Family Medicine

## 2021-10-26 ENCOUNTER — Telehealth: Payer: Self-pay | Admitting: Nurse Practitioner

## 2021-10-26 MED ORDER — LORAZEPAM 0.5 MG PO TABS: ORAL_TABLET | ORAL | 0 refills | Status: DC

## 2021-10-26 NOTE — Telephone Encounter (Signed)
Pt saw simpson and this was ordered under her please advise is she will send something in

## 2021-10-26 NOTE — Telephone Encounter (Signed)
Pt called in about MRI   Pt wants to get MRI switched to the 21st  Wants to make sure the authorization is there for the MRI so that she can switch it to Monday 11/21    Pt also request to have something sent in to help her relax due to the fact that she is claustrophobic   Advised pt to call scheduling tomorrow to ensure the authorization is in

## 2021-10-26 NOTE — Telephone Encounter (Signed)
Pt requesting something for her nerves to take before the MRI. Please advise

## 2021-10-27 ENCOUNTER — Telehealth: Payer: Self-pay | Admitting: Nurse Practitioner

## 2021-10-27 NOTE — Telephone Encounter (Signed)
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

## 2021-10-27 NOTE — Telephone Encounter (Signed)
ERROR

## 2021-10-30 ENCOUNTER — Telehealth: Payer: Self-pay | Admitting: Nurse Practitioner

## 2021-10-30 ENCOUNTER — Ambulatory Visit: Payer: No Typology Code available for payment source | Admitting: Cardiology

## 2021-10-30 ENCOUNTER — Encounter: Payer: Self-pay | Admitting: Nurse Practitioner

## 2021-10-30 ENCOUNTER — Telehealth: Payer: Self-pay | Admitting: Cardiology

## 2021-10-30 NOTE — Telephone Encounter (Signed)
Called patient and left voice mail that letter is ready for pickup.

## 2021-10-30 NOTE — Telephone Encounter (Signed)
Pt called in for the work note in last tele message,   Pt states she returns to work tomorrow for a 12 Hr shifts and would like to have the note before hand .

## 2021-10-30 NOTE — Telephone Encounter (Signed)
Letter is ready to be collected. Please let her know

## 2021-10-30 NOTE — Telephone Encounter (Signed)
Work note completed and msg sent that it is ready to pick up

## 2021-10-31 ENCOUNTER — Telehealth: Payer: Self-pay

## 2021-10-31 NOTE — Telephone Encounter (Signed)
Patient having a MRI at [facility] on Monday, 11/28 asking can she have some medicine to help her relax for this.  Call back # [phone].  Pharmacy: [facility]

## 2021-10-31 NOTE — Telephone Encounter (Signed)
Patient aware this was called in to [facility]

## 2021-11-06 ENCOUNTER — Other Ambulatory Visit: Payer: Self-pay

## 2021-11-06 ENCOUNTER — Ambulatory Visit (HOSPITAL_COMMUNITY)
Admission: RE | Admit: 2021-11-06 | Discharge: 2021-11-06 | Disposition: A | Discharge status: Home / Self Care | Payer: No Typology Code available for payment source | Source: Ambulatory Visit | Attending: Family Medicine | Admitting: Family Medicine

## 2021-11-06 DIAGNOSIS — M5442 Lumbago with sciatica, left side: Secondary | ICD-10-CM | POA: Diagnosis present

## 2021-11-06 DIAGNOSIS — G8929 Other chronic pain: Secondary | ICD-10-CM | POA: Diagnosis present

## 2021-11-06 MED ORDER — GADOBUTROL 1 MMOL/ML IV SOLN
10.0000 mL | Freq: Once | INTRAVENOUS | Status: AC | PRN
  Administered 2021-11-06: 15:00:00 10 mL via INTRAVENOUS

## 2021-11-07 ENCOUNTER — Ambulatory Visit: Payer: No Typology Code available for payment source | Admitting: Internal Medicine

## 2021-11-20 ENCOUNTER — Other Ambulatory Visit: Payer: Self-pay | Admitting: Neurological Surgery

## 2021-11-20 ENCOUNTER — Telehealth: Payer: Self-pay

## 2021-11-20 NOTE — Telephone Encounter (Signed)
Pt called and said [facility] needs referral sent to them.  Not sure what they need.  The referral has been sent to [facility] and she is scheduled for surgery now.  Please let me know what else [facility] needs.  I do not know.

## 2021-11-21 NOTE — Telephone Encounter (Signed)
Spoke with patient everything has been taken care of for surgery 12-05-21

## 2021-11-22 ENCOUNTER — Ambulatory Visit: Payer: No Typology Code available for payment source | Admitting: Urology

## 2021-11-24 ENCOUNTER — Other Ambulatory Visit (HOSPITAL_COMMUNITY): Payer: Self-pay

## 2021-11-24 ENCOUNTER — Ambulatory Visit: Payer: No Typology Code available for payment source | Admitting: Cardiology

## 2021-11-24 ENCOUNTER — Other Ambulatory Visit: Payer: Self-pay | Admitting: Nurse Practitioner

## 2021-11-24 ENCOUNTER — Other Ambulatory Visit: Payer: Self-pay | Admitting: Family Medicine

## 2021-11-24 DIAGNOSIS — E1165 Type 2 diabetes mellitus with hyperglycemia: Secondary | ICD-10-CM

## 2021-11-24 MED ORDER — TIRZEPATIDE 5 MG/0.5ML ~~LOC~~ SOAJ
5.0000 mg | SUBCUTANEOUS | 0 refills | Status: DC
  Filled 2021-11-24: qty 2, 28d supply, fill #0

## 2021-11-24 MED FILL — Insulin Lispro Inj Soln 100 Unit/ML: INTRAMUSCULAR | 50 days supply | Qty: 40 | Fill #0 | Status: AC

## 2021-11-27 ENCOUNTER — Other Ambulatory Visit (HOSPITAL_COMMUNITY): Payer: Self-pay

## 2021-11-27 NOTE — Progress Notes (Incomplete)
CARDIOLOGY CONSULT NOTE       Patient ID: [name] MRN: [id number] DOB/AGE: 1966/06/19 55 y.o.  Admit date: (Not on file) Referring Physician: Nevada Crane Primary Physician: [name], NP Primary Cardiologist: New Reason for Consultation: Chest pain  Active Problems:   * No active hospital problems. *   HPI:  55 y.o. referred by Dr Nevada Crane for chest pain History of DM insulin pump 2013, HTN, HLD Office visity 03/23/21 complained of chest pain and dyspnea for a month heaviness in chest makes it hard to breath Recent stress with children ECG done in office Normal in sinus rate 75 bpm Also normal 10/10/21 sinus rate 73 November had LE leg pain [place] negative for DVT X ray with lumbar stenosis and neurogenic claudication MRI with multilevel degenerative changes but stable from 2020 Apparantly she is to have L5-S1 laminectomy with Dr Zada Finders on 12/05/21  CTA negative for PE on 06/24/21   ***  ROS All other systems reviewed and negative except as noted above  Past Medical History:  Diagnosis Date   Diabetes mellitus    insulin pump 2013   Hypercholesteremia    Hypertension    Incarcerated umbilical hernia 07/04/2034   Kidney stone    Kidney stones 08/31/2008   Qualifier: Diagnosis of  By: Jonna Munro MD, Cornelius     Neuropathy    Obesity    Obstructive sleep apnea    Palpitations    Shortness of breath     Family History  Problem Relation Age of Onset   Cancer Mother    Diabetes Mother    Cancer Paternal Uncle    Depression Paternal Grandmother    Depression Paternal Grandfather    Heart disease Other    Cancer Other    Diabetes Other    Achalasia Other    Anesthesia problems Neg Hx    Hypotension Neg Hx    Malignant hyperthermia Neg Hx    Pseudochol deficiency Neg Hx     Social History   Socioeconomic History   Marital status: Married    Spouse name: Not on file   Number of children: 2   Years of education: college   Highest education  level: Not on file  Occupational History   Occupation: CNA    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: [employer]   Occupation: Quarry manager- at [employer]  Tobacco Use   Smoking status: Never   Smokeless tobacco: Never  Vaping Use   Vaping Use: Never used  Substance and Sexual Activity   Alcohol use: No   Drug use: No   Sexual activity: Yes    Partners: Male    Comment: ablation  Other Topics Concern   Not on file  Social History Narrative   Regular exercise: walks Caffeine use:    Social Determinants of Health   Financial Resource Strain: Not on file  Food Insecurity: Not on file  Transportation Needs: Not on file  Physical Activity: Not on file  Stress: Not on file  Social Connections: Not on file  Intimate Partner Violence: Not on file    Past Surgical History:  Procedure Laterality Date   BIOPSY  06/23/2020   Procedure: BIOPSY;  Surgeon: Daneil Dolin, MD;  [facility];  Service: Endoscopy;;   CARDIAC CATHETERIZATION  12/11/2012   normal coronary arteries   CESAREAN SECTION  1994;2000   [facility]   CHOLECYSTECTOMY  1992   COLONOSCOPY WITH PROPOFOL N/A 06/23/2020   large  amount of stool in entire colon, prep inadequate.   °• CYSTOSCOPY W/ URETERAL STENT PLACEMENT  05/08/2012  ° Procedure: CYSTOSCOPY WITH RETROGRADE PYELOGRAM/URETERAL STENT PLACEMENT;  Surgeon: Mohammad I Javaid, MD;  Location: [facility];  Service: Urology;  Laterality: Right;  °• CYSTOSCOPY WITH RETROGRADE PYELOGRAM, URETEROSCOPY AND STENT PLACEMENT Right 05/04/2020  ° Procedure: CYSTOSCOPY WITH RIGHT RETROGRADE PYELOGRAM, RIGHT URETEROSCOPY AND RIGHT URETERAL STENT EXCHANGE;  Surgeon: McKenzie, Patrick L, MD;  Location: [facility];  Service: Urology;  Laterality: Right;  °• CYSTOSCOPY WITH RETROGRADE PYELOGRAM, URETEROSCOPY AND STENT PLACEMENT Left 08/08/2020  ° Procedure: CYSTOSCOPY WITH RETROGRADE PYELOGRAM, URETEROSCOPY WITH LASER  AND STENT PLACEMENT;  Surgeon: McKenzie, Patrick L, MD;   Location: [facility];  Service: Urology;  Laterality: Left;  °• CYSTOSCOPY WITH RETROGRADE PYELOGRAM, URETEROSCOPY AND STENT PLACEMENT Right 11/08/2020  ° Procedure: CYSTOSCOPY WITH RIGHT RETROGRADE PYELOGRAM, RIGHT URETEROSCOPY AND STENT PLACEMENT;  Surgeon: McKenzie, Patrick L, MD;  Location: [facility];  Service: Urology;  Laterality: Right;  °• CYSTOSCOPY WITH RETROGRADE PYELOGRAM, URETEROSCOPY AND STENT PLACEMENT Left 12/27/2020  ° Procedure: CYSTOSCOPY WITH LEFT RETROGRADE PYELOGRAM, LEFT URETEROSCOPY AND LEFT URETERAL STENT PLACEMENT;  Surgeon: McKenzie, Patrick L, MD;  Location: [facility];  Service: Urology;  Laterality: Left;  °• CYSTOSCOPY WITH RETROGRADE PYELOGRAM, URETEROSCOPY AND STENT PLACEMENT Bilateral 03/16/2021  ° Procedure: CYSTOSCOPY WITH BILATERAL  RETROGRADE PYELOGRAM, BILATERAL URETEROSCOPY AND STENT PLACEMENT ON THE LEFT;  Surgeon: McKenzie, Patrick L, MD;  Location: [facility];  Service: Urology;  Laterality: Bilateral;  °• CYSTOSCOPY WITH STENT PLACEMENT Right 02/25/2020  ° Procedure: CYSTOSCOPY WITH RIGHT RETROGRADE RIGHT STENT PLACEMENT;  Surgeon: Eskridge, Matthew, MD;  Location: [facility];  Service: Urology;  Laterality: Right;  °• ESOPHAGOGASTRODUODENOSCOPY (EGD) WITH PROPOFOL N/A 06/23/2020  ° Normal esophagus, multiple localized erosions were found in gastric antrum s/p biopsy, normal duodenum. Empiric dilation.Reactive gastropathy with erosions, negative H.pylori, no dysplasia.    °• EXTRACORPOREAL SHOCK WAVE LITHOTRIPSY Right 10/11/2020  ° Procedure: EXTRACORPOREAL SHOCK WAVE LITHOTRIPSY (ESWL);  Surgeon: McKenzie, Patrick L, MD;  Location: [facility];  Service: Urology;  Laterality: Right;  °• FOOT SURGERY Right March, 2013  ° [facility]-removal of bone spur  °• HOLMIUM LASER APPLICATION  05/04/2020  ° Procedure: HOLMIUM LASER LITHOTRIPSY RIGHT URETERAL CALCULUS;  Surgeon: McKenzie, Patrick L, MD;  Location: [facility];  Service: Urology;;  °• HYSTEROSCOPY WITH THERMACHOICE  04/25/2012  ° Procedure:  HYSTEROSCOPY WITH THERMACHOICE;  Surgeon: Luther H Eure, MD;  Location: [facility];  Service: Gynecology;  Laterality: N/A;  total therapy time= 11 minutes 42 seconds; 34 ml D5w  in and 34 ml D5w out; temp =87 degrees F  °• LEFT HEART CATHETERIZATION WITH CORONARY ANGIOGRAM N/A 12/11/2012  ° Procedure: LEFT HEART CATHETERIZATION WITH CORONARY ANGIOGRAM;  Surgeon: Jonathan J Berry, MD;  Location: [facility];  Service: Cardiovascular;  Laterality: N/A;  °• MALONEY DILATION N/A 06/23/2020  ° Procedure: MALONEY DILATION;  Surgeon: Rourk, Robert M, MD;  [facility];  Service: Endoscopy;  Laterality: N/A;  °• Sinus sergery  1988  ° [place]  °• STONE EXTRACTION WITH BASKET Right 11/08/2020  ° Procedure: STONE EXTRACTION WITH BASKET;  Surgeon: McKenzie, Patrick L, MD;  Location: [facility];  Service: Urology;  Laterality: Right;  °• UMBILICAL HERNIA REPAIR N/A 03/25/2014  ° Procedure: UMBILICAL HERNIA REPAIR;  Surgeon: William S Bradford, MD;  Location: [facility];  Service: General;  Laterality: N/A;  °• uterine ablation    °  ° ° °Current Outpatient Medications:  °•    albuterol (VENTOLIN HFA) 108 (90 Base) MCG/ACT inhaler, Inhale 2 puffs into the lungs every 6 (six) hours as needed for wheezing or shortness of breath. , Disp: , Rfl:  °•  ALPRAZolam (XANAX) 0.5 MG tablet, Take 0.5 mg by mouth 2 (two) times daily as needed for anxiety., Disp: , Rfl:  °•  ARIPiprazole (ABILIFY) 2 MG tablet, Take 2 mg by mouth at bedtime. , Disp: , Rfl:  °•  blood glucose meter kit and supplies, Use up to four times daily as directed., Disp: 1 each, Rfl: 0 °•  buPROPion (WELLBUTRIN XL) 150 MG 24 hr tablet, Take 150 mg by mouth daily., Disp: , Rfl:  °•  cyclobenzaprine (FLEXERIL) 5 MG tablet, Take 1 tablet (5 mg total) by mouth 3 (three) times daily as needed for muscle spasms. (Patient not taking: Reported on 11/23/2021), Disp: 30 tablet, Rfl: 0 °•  diphenoxylate-atropine (LOMOTIL) 2.5-0.025 MG tablet, Take 1 tablet by mouth 4 (four) times daily  as needed for diarrhea or loose stools., Disp: 20 tablet, Rfl: 0 °•  furosemide (LASIX) 20 MG tablet, Take 40 mg by mouth daily., Disp: , Rfl:  °•  gabapentin (NEURONTIN) 600 MG tablet, TAKE 1 TABLET BY MOUTH THREE TIMES DAILY (Patient not taking: Reported on 08/30/2021), Disp: 90 tablet, Rfl: 2 °•  gabapentin (NEURONTIN) 600 MG tablet, Take one tablet by mouth twice daily , and take two at bedtime, Disp: 120 tablet, Rfl: 1 °•  glucose blood test strip, Test up to 4 times a day, Disp: 100 each, Rfl: 0 °•  insulin lispro (HUMALOG) 100 UNIT/ML injection, Inject 80 units under the skin via omnipod daily, Disp: 40 mL, Rfl: 0 °•  ibuprofen (ADVIL) 800 MG tablet, Take 1 tablet (800 mg total) by mouth 3 (three) times daily. (Patient not taking: Reported on 11/23/2021), Disp: 21 tablet, Rfl: 0 °•  Lancets (FREESTYLE) lancets, Use to test 4 times daily, Disp: 100 each, Rfl: 0 °•  linaclotide (LINZESS) 145 MCG CAPS capsule, Take 1 capsule (145 mcg total) by mouth daily before breakfast., Disp: 90 capsule, Rfl: 3 °•  LORazepam (ATIVAN) 0.5 MG tablet, Take one tablet 30 minutes before test, may repeat one time only,  after 15 minutes if needed, for anxiety (Patient not taking: Reported on 11/23/2021), Disp: 2 tablet, Rfl: 0 °•  methocarbamol (ROBAXIN) 500 MG tablet, Take 1 tablet (500 mg total) by mouth at bedtime as needed for muscle spasms. (Patient not taking: Reported on 11/23/2021), Disp: 20 tablet, Rfl: 0 °•  olmesartan (BENICAR) 20 MG tablet, One tablet daily (Patient taking differently: Take 20 mg by mouth daily. One tablet daily), Disp: , Rfl:  °•  omeprazole (PRILOSEC) 40 MG capsule, TAKE 1 CAPSULE(40 MG) BY MOUTH DAILY, Disp: 30 capsule, Rfl: 0 °•  ondansetron (ZOFRAN) 4 MG tablet, Take 1 tablet (4 mg total) by mouth every 8 (eight) hours as needed for nausea or vomiting., Disp: 20 tablet, Rfl: 0 °•  oxybutynin (DITROPAN) 5 MG tablet, Take 5 mg by mouth at bedtime. , Disp: , Rfl:  °•  potassium chloride SA (KLOR-CON  M15) 15 MEQ tablet, Take 15 mEq by mouth 2 (two) times daily., Disp: , Rfl:  °•  simvastatin (ZOCOR) 40 MG tablet, Take 40 mg by mouth daily. , Disp: , Rfl:  °•  tirzepatide (MOUNJARO) 2.5 MG/0.5ML Pen, Inject 2.5 mg into the skin once a week., Disp: 2 mL, Rfl: 0 °•  tirzepatide (MOUNJARO) 5 MG/0.5ML Pen, Inject 5 mg into the skin once a   week., Disp: 6 mL, Rfl: 0 °•  traMADol (ULTRAM) 50 MG tablet, Take 1 tablet (50 mg total) by mouth every 12 (twelve) hours as needed for moderate pain or severe pain., Disp: 20 tablet, Rfl: 0 ° ° ° °Physical Exam: °Last menstrual period 07/16/2012.   ° °Affect appropriate °Healthy:  appears stated age °HEENT: normal °Neck supple with no adenopathy °JVP normal no bruits no thyromegaly °Lungs clear with no wheezing and good diaphragmatic motion °Heart:  S1/S2 no murmur, no rub, gallop or click °PMI normal °Abdomen: benighn, BS positve, no tenderness, no AAA °no bruit.  No HSM or HJR °Distal pulses intact with no bruits °No edema °Neuro non-focal °Skin warm and dry °No muscular weakness ° °Labs: °  °Lab Results  °Component Value Date  ° WBC 8.8 10/10/2021  ° HGB 12.5 10/10/2021  ° HCT 37.6 10/10/2021  ° MCV 86.2 10/10/2021  ° PLT 294 10/10/2021  ° No results for input(s): NA, K, CL, CO2, BUN, CREATININE, CALCIUM, PROT, BILITOT, ALKPHOS, ALT, AST, GLUCOSE in the last 168 hours. ° °Invalid input(s): LABALBU °Lab Results  °Component Value Date  ° CKTOTAL 228 (H) 10/11/2021  ° CKMB 2.4 05/08/2011  ° TROPONINI <0.03 05/23/2015  °  °Lab Results  °Component Value Date  ° CHOL 186 08/30/2021  ° CHOL 186 07/20/2013  ° CHOL 181 08/26/2012  ° °Lab Results  °Component Value Date  ° HDL 40 08/30/2021  ° HDL 47 07/20/2013  ° HDL 42 08/26/2012  ° °Lab Results  °Component Value Date  ° LDLCALC 113 (H) 08/30/2021  ° LDLCALC 91 07/20/2013  ° LDLCALC 105 (H) 08/26/2012  ° °Lab Results  °Component Value Date  ° TRIG 190 (H) 08/30/2021  ° TRIG 241 (H) 07/20/2013  ° TRIG 168 (H) 08/26/2012  ° °Lab Results   °Component Value Date  ° CHOLHDL 4.0 07/20/2013  ° CHOLHDL 4.3 08/26/2012  ° CHOLHDL 4.2 Ratio 09/01/2008  ° °No results found for: LDLDIRECT  °  °Radiology: °MR Lumbar Spine W Wo Contrast ° °Result Date: 11/06/2021 °CLINICAL DATA:  Low back pain radiating to both legs EXAM: MRI LUMBAR SPINE WITHOUT AND WITH CONTRAST TECHNIQUE: Multiplanar and multiecho pulse sequences of the lumbar spine were obtained without and with intravenous contrast. CONTRAST:  10mL GADAVIST GADOBUTROL 1 MMOL/ML IV SOLN COMPARISON:  Lumbar spine radiographs 10/18/2021, lumbar spine MRI 05/20/2019 FINDINGS: Segmentation: Standard; the lowest formed disc space is designated L5-S1 Alignment: There is trace grade 1 anterolisthesis of L5 on S1 with associated bilateral pars defects, unchanged. Alignment is otherwise normal. Vertebrae: Vertebral body heights are preserved. There is no suspicious marrow signal abnormality. There is no abnormal marrow enhancement. Conus medullaris and cauda equina: Conus extends to the mid L1 level. Conus and cauda equina appear normal. Paraspinal and other soft tissues: Negative. Disc levels: There is disc desiccation and narrowing most advanced at L4-L5, similar to the prior study. There is mild multilevel facet arthropathy, most advanced at L4-L5 and L5-S1. T12-L1: There is a small protrusion without significant spinal canal or neural foraminal stenosis. L1-L2: There is a small protrusion without significant spinal canal or neural foraminal stenosis. L2-L3: There is a minimal disc protrusion without significant spinal canal or neural foraminal stenosis. L3-L4: There is a minimal disc protrusion without significant spinal canal or neural foraminal stenosis. L4-L5: There is a mild central protrusion and annular fissure, mild degenerative endplate change, and bilateral facet arthropathy resulting in mild crowding of the right subarticular zone without evidence of nerve root   impingement, and mild bilateral neural  foraminal stenosis, unchanged. L5-S1: There is a diffuse disc bulge with a small left subarticular zone/foraminal superiorly migrated extrusion, mild degenerative endplate change, and bilateral facet arthropathy resulting in mild left neural foraminal stenosis with possible contact of the exiting L5 nerve root, and no significant spinal canal or right neural foraminal stenosis. Findings are unchanged. IMPRESSION: 1. Unchanged grade 1 anterolisthesis of L5 on S1 with associated bilateral pars defects. A diffuse disc bulge with a left subarticular zone/foraminal extrusion is also unchanged, resulting in mild left neural foraminal stenosis with possible contact of the exiting L5 nerve root. 2. Unchanged disc space narrowing with a small central protrusion and annular fissure at L4-L5. 3. Otherwise, mild multilevel degenerative changes throughout the remainder of the lumbar spine detailed above are also not significantly changed since 05/20/2019. No other evidence of significant spinal canal or neural foraminal stenosis, or nerve root impingement. Electronically Signed   By:   Noone M.D.   On: 11/06/2021 15:41   ° °EKG: SR rate 73 normal 10/10/21 ( read as septal infarct )  ° ° °ASSESSMENT AND PLAN:  ° °Chest pain:  In setting of  DM, HTN, HLD ECG ok Shared decision making favor risk stratification with cardiac CTA  °HTN:  Well controlled.  Continue current medications and low sodium Dash type diet.   °HLD:  continue statin  °DM:  Discussed low carb diet.  Target hemoglobin A1c is 6.5 or less.  Continue current medications. °Ortho:  degenerative lumbar disc disease Tramadol *** ° ° °*** ° °F/U PRN if CTA low risk  ° °Signed: °  °11/27/2021, 5:52 PM ° ° °

## 2021-11-29 ENCOUNTER — Other Ambulatory Visit: Payer: Self-pay

## 2021-11-29 ENCOUNTER — Ambulatory Visit (INDEPENDENT_AMBULATORY_CARE_PROVIDER_SITE_OTHER): Payer: No Typology Code available for payment source | Admitting: Nurse Practitioner

## 2021-11-29 ENCOUNTER — Encounter: Payer: Self-pay | Admitting: Nurse Practitioner

## 2021-11-29 VITALS — BP 136/82 | HR 85 | Ht <= 58 in | Wt 206.0 lb

## 2021-11-29 DIAGNOSIS — Z794 Long term (current) use of insulin: Secondary | ICD-10-CM

## 2021-11-29 DIAGNOSIS — E1165 Type 2 diabetes mellitus with hyperglycemia: Secondary | ICD-10-CM | POA: Diagnosis not present

## 2021-11-29 DIAGNOSIS — G4733 Obstructive sleep apnea (adult) (pediatric): Secondary | ICD-10-CM | POA: Diagnosis not present

## 2021-11-29 DIAGNOSIS — E1369 Other specified diabetes mellitus with other specified complication: Secondary | ICD-10-CM

## 2021-11-29 DIAGNOSIS — Z0001 Encounter for general adult medical examination with abnormal findings: Secondary | ICD-10-CM

## 2021-11-29 DIAGNOSIS — I10 Essential (primary) hypertension: Secondary | ICD-10-CM | POA: Diagnosis not present

## 2021-11-29 DIAGNOSIS — E785 Hyperlipidemia, unspecified: Secondary | ICD-10-CM

## 2021-11-29 NOTE — Assessment & Plan Note (Signed)
-  she never had sleep study -referral for sleep study

## 2021-11-29 NOTE — Progress Notes (Signed)
Surgical Instructions    Your procedure is scheduled on December 05, 2021.  Report to [facility] Main Entrance "A" at 1:00 PM, then check in with the Admitting office.  Call this number if you have problems the morning of surgery:  [phone]   If you have any questions prior to your surgery date call [phone]: Open Monday-Friday 8am-4pm   Remember:  Do not eat after midnight the night before your surgery  You may drink clear liquids until 12:00pm the afternoon of your surgery.   Clear liquids allowed are: Water, Non-Citrus Juices (without pulp), Carbonated Beverages, Clear Tea, Black Coffee ONLY (NO MILK, CREAM OR POWDERED CREAMER of any kind), and Gatorade    Take these medicines the morning of surgery with A SIP OF WATER: Bupropion (Wellbutrin XL) Gabapentin (Neurontin) Omeprazole (Prilosec)  Simvastatin (Zocor)  If needed: Albuterol (Ventolin HFA) --Please bring all inhalers with you the day of surgery.  Cyclobenzaprine (Flexeril) Lorazepam (Ativan) Ondansetron (Zofran) Tramadol (Ultram)  WHAT DO I DO ABOUT MY DIABETES MEDICATION?   THE DAY BEFORE SURGERY, take normal dose of Insulin Lispro (Humalog).    THE MORNING OF SURGERY, DO NOT TAKE ANY Insulin Lispro (Humalog).  The day of surgery, do not take other diabetes injectables, including Byetta (exenatide), Bydureon (exenatide ER), Victoza (liraglutide), or Trulicity (dulaglutide).  If your CBG is greater than 220 mg/dL, you may take  of your sliding scale (correction) dose of insulin.   HOW TO MANAGE YOUR DIABETES BEFORE AND AFTER SURGERY  Why is it important to control my blood sugar before and after surgery? Improving blood sugar levels before and after surgery helps healing and can limit problems. A way of improving blood sugar control is eating a healthy diet by:  Eating less sugar and carbohydrates  Increasing activity/exercise  Talking with your doctor about reaching your blood sugar goals High  blood sugars (greater than 180 mg/dL) can raise your risk of infections and slow your recovery, so you will need to focus on controlling your diabetes during the weeks before surgery. Make sure that the doctor who takes care of your diabetes knows about your planned surgery including the date and location.  How do I manage my blood sugar before surgery? Check your blood sugar at least 4 times a day, starting 2 days before surgery, to make sure that the level is not too high or low.  Check your blood sugar the morning of your surgery when you wake up and every 2 hours until you get to the Short Stay unit.  If your blood sugar is less than 70 mg/dL, you will need to treat for low blood sugar: Do not take insulin. Treat a low blood sugar (less than 70 mg/dL) with  cup of clear juice (cranberry or apple), 4 glucose tablets, OR glucose gel. Recheck blood sugar in 15 minutes after treatment (to make sure it is greater than 70 mg/dL). If your blood sugar is not greater than 70 mg/dL on recheck, call [phone] for further instructions. Report your blood sugar to the short stay nurse when you get to Short Stay.  If you are admitted to the hospital after surgery: Your blood sugar will be checked by the staff and you will probably be given insulin after surgery (instead of oral diabetes medicines) to make sure you have good blood sugar levels. The goal for blood sugar control after surgery is 80-180 mg/dL.    As of today, STOP taking any Aspirin (unless otherwise instructed by your  surgeon) Aleve, Naproxen, Ibuprofen, Motrin, Advil, Goody's, BC's, all herbal medications, fish oil, and all vitamins.     After your COVID test   You are not required to quarantine however you are required to wear a well-fitting mask when you are out and around people not in your household.  If your mask becomes wet or soiled, replace with a new one.  Wash your hands often with soap and water for 20 seconds or clean  your hands with an alcohol-based hand sanitizer that contains at least 60% alcohol.  Do not share personal items.  Notify your provider: if you are in close contact with someone who has COVID  or if you develop a fever of 100.4 or greater, sneezing, cough, sore throat, shortness of breath or body aches.            Do not wear jewelry or makeup Do not wear lotions, powders, perfumes, or deodorant. Do not shave 48 hours prior to surgery.   Do not bring valuables to the hospital. DO Not wear nail polish, gel polish, artificial nails, or any other type of covering on natural nails including finger and toenails. If patients have artificial nails, gel coating, etc. that need to be removed by a nail salon, please have this removed prior to surgery or surgery may need to be canceled/delayed if the surgeon/ anesthesia feels like the patient is unable to be adequately monitored.             [facility] is not responsible for any belongings or valuables.  Do NOT Smoke (Tobacco/Vaping)  24 hours prior to your procedure  If you use a CPAP at night, you may bring your mask for your overnight stay.   Contacts, glasses, hearing aids, dentures or partials may not be worn into surgery, please bring cases for these belongings   For patients admitted to the hospital, discharge time will be determined by your treatment team.   Patients discharged the day of surgery will not be allowed to drive home, and someone needs to stay with them for 24 hours.  NO VISITORS WILL BE ALLOWED IN PRE-OP WHERE PATIENTS ARE PREPPED FOR SURGERY.  ONLY 1 SUPPORT PERSON MAY BE PRESENT IN THE WAITING ROOM WHILE YOU ARE IN SURGERY.  IF YOU ARE TO BE ADMITTED, ONCE YOU ARE IN YOUR ROOM YOU WILL BE ALLOWED TWO (2) VISITORS. 1 (ONE) VISITOR MAY STAY OVERNIGHT BUT MUST ARRIVE TO THE ROOM BY 8pm.  Minor children may have two parents present. Special consideration for safety and communication needs will be reviewed on a case by case  basis.  Special instructions:    Oral Hygiene is also important to reduce your risk of infection.  Remember - BRUSH YOUR TEETH THE MORNING OF SURGERY WITH YOUR REGULAR TOOTHPASTE   [place]- Preparing For Surgery  Before surgery, you can play an important role. Because skin is not sterile, your skin needs to be as free of germs as possible. You can reduce the number of germs on your skin by washing with CHG (chlorahexidine gluconate) Soap before surgery.  CHG is an antiseptic cleaner which kills germs and bonds with the skin to continue killing germs even after washing.     Please do not use if you have an allergy to CHG or antibacterial soaps. If your skin becomes reddened/irritated stop using the CHG.  Do not shave (including legs and underarms) for at least 48 hours prior to first CHG shower. It is OK to  shave your face.  Please follow these instructions carefully.     Shower the NIGHT BEFORE SURGERY and the MORNING OF SURGERY with CHG Soap.   If you chose to wash your hair, wash your hair first as usual with your normal shampoo. After you shampoo, rinse your hair and body thoroughly to remove the shampoo.  Then [employer] and genitals (private parts) with your normal soap and rinse thoroughly to remove soap.  After that Use CHG Soap as you would any other liquid soap. You can apply CHG directly to the skin and wash gently with a scrungie or a clean washcloth.   Apply the CHG Soap to your body ONLY FROM THE NECK DOWN.  Do not use on open wounds or open sores. Avoid contact with your eyes, ears, mouth and genitals (private parts). Wash Face and genitals (private parts)  with your normal soap.   Wash thoroughly, paying special attention to the area where your surgery will be performed.  Thoroughly rinse your body with warm water from the neck down.  DO NOT shower/wash with your normal soap after using and rinsing off the CHG Soap.  Pat yourself dry with a CLEAN TOWEL.  Wear CLEAN  PAJAMAS to bed the night before surgery  Place CLEAN SHEETS on your bed the night before your surgery  DO NOT SLEEP WITH PETS.   Day of Surgery:  Take a shower with CHG soap. Wear Clean/Comfortable clothing the morning of surgery Do not apply any deodorants/lotions.   Remember to brush your teeth WITH YOUR REGULAR TOOTHPASTE.   Please read over the following fact sheets that you were given.

## 2021-11-29 NOTE — Progress Notes (Signed)
Acute Office Visit  Subjective:    Patient ID: [name], female    DOB: 03/12/1966, 55 y.o.   MRN: [id number]  Chief Complaint  Patient presents with   Annual Exam    CPE    HPI Patient is in today for physical exam.  She states that she gets poor sleep of about 4 hours per night. She was supposed to be tested for OSA with Dr. Gwenlyn Found as far back as 2015, but this was never completed.  He states her blood sugar is consistently around 200 despite using an insulin pump and recently using mounjaro which helped with decreasing her appetite.  She is unsure if she is type 1 or type 2 diabetic, but has been using a pump since around 2013. She states that she does not have low blood sugars despite keeping the same insulin dosing and eating less with mounjaro.  Past Medical History:  Diagnosis Date   Diabetes mellitus    insulin pump 2013   Dysphagia 03/09/2020   Hypercholesteremia    Hypertension    Incarcerated umbilical hernia 4/46/2863   Kidney stone    Kidney stones 08/31/2008   Qualifier: Diagnosis of  By: Jonna Munro MD, Cornelius     Neuropathy    Obesity    Obstructive sleep apnea    Palpitations    Shortness of breath     Past Surgical History:  Procedure Laterality Date   BIOPSY  06/23/2020   Procedure: BIOPSY;  Surgeon: Daneil Dolin, MD;  [facility];  Service: Endoscopy;;   CARDIAC CATHETERIZATION  12/11/2012   normal coronary arteries   CESAREAN SECTION  1994;2000   [facility]   CHOLECYSTECTOMY  1992   COLONOSCOPY WITH PROPOFOL N/A 06/23/2020   large amount of stool in entire colon, prep inadequate.    CYSTOSCOPY W/ URETERAL STENT PLACEMENT  05/08/2012   Procedure: CYSTOSCOPY WITH RETROGRADE PYELOGRAM/URETERAL STENT PLACEMENT;  Surgeon: Marissa Nestle, MD;  Location: [facility];  Service: Urology;  Laterality: Right;   CYSTOSCOPY WITH RETROGRADE PYELOGRAM, URETEROSCOPY AND STENT PLACEMENT Right 05/04/2020   Procedure: CYSTOSCOPY WITH RIGHT RETROGRADE  PYELOGRAM, RIGHT URETEROSCOPY AND RIGHT URETERAL STENT EXCHANGE;  Surgeon: Cleon Gustin, MD;  Location: [facility];  Service: Urology;  Laterality: Right;   CYSTOSCOPY WITH RETROGRADE PYELOGRAM, URETEROSCOPY AND STENT PLACEMENT Left 08/08/2020   Procedure: CYSTOSCOPY WITH RETROGRADE PYELOGRAM, URETEROSCOPY WITH LASER  AND STENT PLACEMENT;  Surgeon: Cleon Gustin, MD;  Location: [facility];  Service: Urology;  Laterality: Left;   CYSTOSCOPY WITH RETROGRADE PYELOGRAM, URETEROSCOPY AND STENT PLACEMENT Right 11/08/2020   Procedure: CYSTOSCOPY WITH RIGHT RETROGRADE PYELOGRAM, RIGHT URETEROSCOPY AND STENT PLACEMENT;  Surgeon: Cleon Gustin, MD;  Location: [facility];  Service: Urology;  Laterality: Right;   CYSTOSCOPY WITH RETROGRADE PYELOGRAM, URETEROSCOPY AND STENT PLACEMENT Left 12/27/2020   Procedure: CYSTOSCOPY WITH LEFT RETROGRADE PYELOGRAM, LEFT URETEROSCOPY AND LEFT URETERAL STENT PLACEMENT;  Surgeon: Cleon Gustin, MD;  Location: [facility];  Service: Urology;  Laterality: Left;   CYSTOSCOPY WITH RETROGRADE PYELOGRAM, URETEROSCOPY AND STENT PLACEMENT Bilateral 03/16/2021   Procedure: CYSTOSCOPY WITH BILATERAL  RETROGRADE PYELOGRAM, BILATERAL URETEROSCOPY AND STENT PLACEMENT ON THE LEFT;  Surgeon: Cleon Gustin, MD;  Location: [facility];  Service: Urology;  Laterality: Bilateral;   CYSTOSCOPY WITH STENT PLACEMENT Right 02/25/2020   Procedure: CYSTOSCOPY WITH RIGHT RETROGRADE RIGHT STENT PLACEMENT;  Surgeon: Festus Aloe, MD;  Location: [facility];  Service: Urology;  Laterality: Right;   ESOPHAGOGASTRODUODENOSCOPY (EGD) WITH  PROPOFOL N/A 06/23/2020   Normal esophagus, multiple localized erosions were found in gastric antrum s/p biopsy, normal duodenum. Empiric dilation.Reactive gastropathy with erosions, negative H.pylori, no dysplasia.     EXTRACORPOREAL SHOCK WAVE LITHOTRIPSY Right 10/11/2020   Procedure: EXTRACORPOREAL SHOCK WAVE LITHOTRIPSY (ESWL);  Surgeon: Cleon Gustin, MD;   Location: [facility];  Service: Urology;  Laterality: Right;   FOOT SURGERY Right March, 2013   [facility]-removal of bone spur   HOLMIUM LASER APPLICATION  04/01/5360   Procedure: HOLMIUM LASER LITHOTRIPSY RIGHT URETERAL CALCULUS;  Surgeon: Cleon Gustin, MD;  Location: [facility];  Service: Urology;;   HYSTEROSCOPY WITH THERMACHOICE  04/25/2012   Procedure: HYSTEROSCOPY WITH THERMACHOICE;  Surgeon: Florian Buff, MD;  Location: [facility];  Service: Gynecology;  Laterality: N/A;  total therapy time= 11 minutes 42 seconds; 34 ml D5w  in and 34 ml D5w out; temp =87 degrees F   LEFT HEART CATHETERIZATION WITH CORONARY ANGIOGRAM N/A 12/11/2012   Procedure: LEFT HEART CATHETERIZATION WITH CORONARY ANGIOGRAM;  Surgeon: Lorretta Harp, MD;  [facility] LAB;  Service: Cardiovascular;  Laterality: N/A;   MALONEY DILATION N/A 06/23/2020   Procedure: Venia Minks DILATION;  Surgeon: Daneil Dolin, MD;  [facility];  Service: Endoscopy;  Laterality: N/A;   Sinus sergery  1988   [place]   STONE EXTRACTION WITH BASKET Right 11/08/2020   Procedure: STONE EXTRACTION WITH BASKET;  Surgeon: Cleon Gustin, MD;  Location: [facility];  Service: Urology;  Laterality: Right;   UMBILICAL HERNIA REPAIR N/A 03/25/2014   Procedure: UMBILICAL HERNIA REPAIR;  Surgeon: Scherry Ran, MD;  Location: [facility];  Service: General;  Laterality: N/A;   uterine ablation      Family History  Problem Relation Age of Onset   Cancer Mother    Diabetes Mother    Cancer Paternal Uncle    Depression Paternal Grandmother    Depression Paternal Grandfather    Heart disease Other    Cancer Other    Diabetes Other    Achalasia Other    Anesthesia problems Neg Hx    Hypotension Neg Hx    Malignant hyperthermia Neg Hx    Pseudochol deficiency Neg Hx     Social History   Socioeconomic History   Marital status: Married    Spouse name: Not on file   Number of children: 2   Years of education: college    Highest education level: Not on file  Occupational History   Occupation: CNA    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: [employer]   Occupation: Quarry manager- at [employer]  Tobacco Use   Smoking status: Never   Smokeless tobacco: Never  Vaping Use   Vaping Use: Never used  Substance and Sexual Activity   Alcohol use: No   Drug use: No   Sexual activity: Yes    Partners: Male    Comment: ablation  Other Topics Concern   Not on file  Social History Narrative   Regular exercise: walks Caffeine use:    Social Determinants of Health   Financial Resource Strain: Not on file  Food Insecurity: Not on file  Transportation Needs: Not on file  Physical Activity: Not on file  Stress: Not on file  Social Connections: Not on file  Intimate Partner Violence: Not on file    Outpatient Medications Prior to Visit  Medication Sig Dispense Refill   albuterol (VENTOLIN HFA) 108 (90 Base) MCG/ACT inhaler Inhale  2 puffs into the lungs every 6 (six) hours as needed for wheezing or shortness of breath.      ARIPiprazole (ABILIFY) 2 MG tablet Take 2 mg by mouth at bedtime.      blood glucose meter kit and supplies Use up to four times daily as directed. 1 each 0   buPROPion (WELLBUTRIN XL) 150 MG 24 hr tablet Take 150 mg by mouth daily.     cyclobenzaprine (FLEXERIL) 5 MG tablet Take 1 tablet (5 mg total) by mouth 3 (three) times daily as needed for muscle spasms. 30 tablet 0   diphenoxylate-atropine (LOMOTIL) 2.5-0.025 MG tablet Take 1 tablet by mouth 4 (four) times daily as needed for diarrhea or loose stools. 20 tablet 0   furosemide (LASIX) 20 MG tablet Take 40 mg by mouth daily.     gabapentin (NEURONTIN) 600 MG tablet TAKE 1 TABLET BY MOUTH THREE TIMES DAILY 90 tablet 2   gabapentin (NEURONTIN) 600 MG tablet Take one tablet by mouth twice daily , and take two at bedtime 120 tablet 1   glucose blood test strip Test up to 4 times a day 100 each 0   ibuprofen (ADVIL) 800 MG tablet Take 1  tablet (800 mg total) by mouth 3 (three) times daily. 21 tablet 0   insulin lispro (HUMALOG) 100 UNIT/ML injection Inject 80 units under the skin via omnipod daily 40 mL 0   Lancets (FREESTYLE) lancets Use to test 4 times daily 100 each 0   LORazepam (ATIVAN) 0.5 MG tablet Take one tablet 30 minutes before test, may repeat one time only,  after 15 minutes if needed, for anxiety 2 tablet 0   methocarbamol (ROBAXIN) 500 MG tablet Take 1 tablet (500 mg total) by mouth at bedtime as needed for muscle spasms. 20 tablet 0   olmesartan (BENICAR) 20 MG tablet One tablet daily (Patient taking differently: Take 20 mg by mouth daily. One tablet daily)     omeprazole (PRILOSEC) 40 MG capsule TAKE 1 CAPSULE(40 MG) BY MOUTH DAILY 30 capsule 0   ondansetron (ZOFRAN) 4 MG tablet Take 1 tablet (4 mg total) by mouth every 8 (eight) hours as needed for nausea or vomiting. 20 tablet 0   oxybutynin (DITROPAN) 5 MG tablet Take 5 mg by mouth at bedtime.      potassium chloride SA (KLOR-CON M15) 15 MEQ tablet Take 15 mEq by mouth 2 (two) times daily.     simvastatin (ZOCOR) 40 MG tablet Take 40 mg by mouth daily.      traMADol (ULTRAM) 50 MG tablet Take 1 tablet (50 mg total) by mouth every 12 (twelve) hours as needed for moderate pain or severe pain. 20 tablet 0   ALPRAZolam (XANAX) 0.5 MG tablet Take 0.5 mg by mouth 2 (two) times daily as needed for anxiety.     linaclotide (LINZESS) 145 MCG CAPS capsule Take 1 capsule (145 mcg total) by mouth daily before breakfast. 90 capsule 3   tirzepatide (MOUNJARO) 2.5 MG/0.5ML Pen Inject 2.5 mg into the skin once a week. 2 mL 0   tirzepatide (MOUNJARO) 5 MG/0.5ML Pen Inject 5 mg into the skin once a week. 6 mL 0   No facility-administered medications prior to visit.    Allergies  Allergen Reactions   Ciprofloxacin Hives and Swelling   Penicillins Anaphylaxis and Rash   Codeine Other (See Comments)    had hallicunations   Morphine Nausea And Vomiting and Other (See  Comments)    recieved in  ED due to [facility] and had multple doses - gave visual hallucinations and vomitting.   Sulfonamide Derivatives Other (See Comments)    REACTION: Unsure - childhood allergy   Vancomycin Itching    Review of Systems  Constitutional: Negative.   HENT: Negative.    Eyes: Negative.   Respiratory: Negative.    Cardiovascular: Negative.   Gastrointestinal: Negative.   Endocrine:       Consistently elevated blood sugar  Genitourinary: Negative.   Musculoskeletal:  Positive for back pain.       Sees neurosurgery next week for procedure  Skin: Negative.   Allergic/Immunologic: Negative.   Neurological:  Positive for numbness.  Hematological: Negative.   Psychiatric/Behavioral: Negative.        Objective:    Physical Exam Constitutional:      Appearance: Normal appearance. She is obese.  HENT:     Head: Normocephalic and atraumatic.     Right Ear: Tympanic membrane, ear canal and external ear normal.     Left Ear: Tympanic membrane, ear canal and external ear normal.     Nose: Nose normal.     Mouth/Throat:     Mouth: Mucous membranes are moist.     Pharynx: Oropharynx is clear.  Eyes:     Extraocular Movements: Extraocular movements intact.     Conjunctiva/sclera: Conjunctivae normal.     Pupils: Pupils are equal, round, and reactive to light.  Cardiovascular:     Rate and Rhythm: Normal rate and regular rhythm.     Pulses: Normal pulses.          Dorsalis pedis pulses are 2+ on the right side and 2+ on the left side.     Heart sounds: Normal heart sounds.  Pulmonary:     Effort: Pulmonary effort is normal.     Breath sounds: Normal breath sounds.  Abdominal:     General: Abdomen is flat. Bowel sounds are normal.     Palpations: Abdomen is soft.  Musculoskeletal:     Cervical back: Normal range of motion and neck supple.     Comments: Pain with ROM, had difficulty with getting on the exam table d/t back pain  Feet:     Right foot:      Protective Sensation: 10 sites tested.  10 sites sensed.     Skin integrity: Callus and dry skin present.     Toenail Condition: Right toenails are normal.     Left foot:     Protective Sensation: 10 sites tested.  10 sites sensed.     Skin integrity: Callus and dry skin present.     Toenail Condition: Left toenails are normal.  Skin:    General: Skin is warm and dry.     Capillary Refill: Capillary refill takes less than 2 seconds.  Neurological:     General: No focal deficit present.     Mental Status: She is alert and oriented to person, place, and time.     Cranial Nerves: No cranial nerve deficit.     Motor: No weakness.     Coordination: Coordination normal.     Gait: Gait abnormal.     Comments: Antalgic gait  Psychiatric:        Mood and Affect: Mood normal.        Behavior: Behavior normal.        Thought Content: Thought content normal.        Judgment: Judgment normal.    BP 136/82  Pulse 85    Ht 4' 9"  (1.448 m)    Wt 206 lb 0.6 oz (93.5 kg)    LMP 07/16/2012    SpO2 96%    BMI 44.59 kg/m  Wt Readings from Last 3 Encounters:  11/29/21 206 lb 0.6 oz (93.5 kg)  10/17/21 221 lb (100.2 kg)  10/13/21 222 lb 1.6 oz (100.7 kg)    Health Maintenance Due  Topic Date Due   Pneumococcal Vaccine 11-75 Years old (2 - PCV) 02/22/2015    There are no preventive care reminders to display for this patient.   Lab Results  Component Value Date   TSH 1.438 11/18/2012   Lab Results  Component Value Date   WBC 8.8 10/10/2021   HGB 12.5 10/10/2021   HCT 37.6 10/10/2021   MCV 86.2 10/10/2021   PLT 294 10/10/2021   Lab Results  Component Value Date   NA 138 10/10/2021   K 3.4 (L) 10/10/2021   CO2 28 10/10/2021   GLUCOSE 107 (H) 10/10/2021   BUN 14 10/10/2021   CREATININE 0.75 10/10/2021   BILITOT 0.6 10/10/2021   ALKPHOS 96 10/10/2021   AST 21 10/10/2021   ALT 22 10/10/2021   PROT 6.8 10/10/2021   ALBUMIN 3.6 10/10/2021   CALCIUM 8.6 (L) 10/10/2021   ANIONGAP  7 10/10/2021   EGFR 79 09/21/2021   Lab Results  Component Value Date   CHOL 186 08/30/2021   Lab Results  Component Value Date   HDL 40 08/30/2021   Lab Results  Component Value Date   LDLCALC 113 (H) 08/30/2021   Lab Results  Component Value Date   TRIG 190 (H) 08/30/2021   Lab Results  Component Value Date   CHOLHDL 4.0 07/20/2013   Lab Results  Component Value Date   HGBA1C 10.2 (H) 08/30/2021       Assessment & Plan:   Problem List Items Addressed This Visit       Cardiovascular and Mediastinum   Essential hypertension    BP Readings from Last 3 Encounters:  11/29/21 136/82  10/17/21 129/83  10/13/21 140/86  -well controlled      Relevant Orders   CBC with Differential/Platelet   CMP14+EGFR   Lipid Panel With LDL/HDL Ratio     Respiratory   OSA (obstructive sleep apnea)    -she never had sleep study -referral for sleep study        Endocrine   Diabetes mellitus ([place])    -pt unsure if type 1 or type 2 -she has been wearing an insulin pump for several years and uses 80 units of humalog per day -she doesn't see endocrinology and pump was managed by previous PCP -no medical records were available, and I assumed she was Type 2 given body habitus -STOP mounjaro for now, since we are unsure if she is Type 1 or 2 -will get c-peptide and Autoimmune panel -referral to endocrinology -referral to pharmacy; will help set up CGM      Relevant Orders   CBC with Differential/Platelet   CMP14+EGFR   Lipid Panel With LDL/HDL Ratio   Hemoglobin A1c   C-peptide   Diabetes Autoimmune Profile   Ambulatory referral to Endocrinology   Microalbumin / creatinine urine ratio   RESOLVED: Type 2 diabetes mellitus with hyperglycemia (H[facility])    -        Other   HLD (hyperlipidemia)    -check lipids; goal LDL < 70      Relevant Orders  Lipid Panel With LDL/HDL Ratio   Other Visit Diagnoses     Encounter for general adult medical examination with abnormal  findings    -  Primary   Relevant Orders   CBC with Differential/Platelet   CMP14+EGFR   Lipid Panel With LDL/HDL Ratio   Hemoglobin A1c        No orders of the defined types were placed in this encounter.    [name], NP

## 2021-11-29 NOTE — Assessment & Plan Note (Signed)
-  check lipids; goal LDL < 70

## 2021-11-29 NOTE — Assessment & Plan Note (Signed)
-  pt unsure if type 1 or type 2 -she has been wearing an insulin pump for several years and uses 80 units of humalog per day -she doesn't see endocrinology and pump was managed by previous PCP -no medical records were available, and I assumed she was Type 2 given body habitus -STOP mounjaro for now, since we are unsure if she is Type 1 or 2 -will get c-peptide and Autoimmune panel -referral to endocrinology -referral to pharmacy; will help set up CGM

## 2021-11-29 NOTE — Assessment & Plan Note (Signed)
BP Readings from Last 3 Encounters:  11/29/21 136/82  10/17/21 129/83  10/13/21 140/86   -well controlled

## 2021-11-29 NOTE — Patient Instructions (Signed)
Please have fasting labs drawn today. ° °I will be moving to [facility] located at [address], [place], [place] [address] effective Dec 10, 2021. °If you would like to establish care with [facility] please call [phone]. °

## 2021-12-01 ENCOUNTER — Ambulatory Visit (INDEPENDENT_AMBULATORY_CARE_PROVIDER_SITE_OTHER): Payer: No Typology Code available for payment source | Admitting: Cardiovascular Disease

## 2021-12-01 ENCOUNTER — Encounter (HOSPITAL_COMMUNITY)
Admission: RE | Admit: 2021-12-01 | Discharge: 2021-12-01 | Disposition: A | Discharge status: Home / Self Care | Payer: No Typology Code available for payment source | Source: Ambulatory Visit | Attending: Neurological Surgery | Admitting: Neurological Surgery

## 2021-12-01 ENCOUNTER — Encounter (HOSPITAL_COMMUNITY): Payer: Self-pay | Admitting: Vascular Surgery

## 2021-12-01 ENCOUNTER — Encounter: Payer: Self-pay | Admitting: Cardiovascular Disease

## 2021-12-01 ENCOUNTER — Other Ambulatory Visit: Payer: Self-pay

## 2021-12-01 ENCOUNTER — Encounter (HOSPITAL_COMMUNITY): Payer: Self-pay

## 2021-12-01 VITALS — BP 132/76 | HR 80 | Temp 97.7°F | Resp 18 | Ht <= 58 in | Wt 203.8 lb

## 2021-12-01 DIAGNOSIS — Z20822 Contact with and (suspected) exposure to covid-19: Secondary | ICD-10-CM | POA: Diagnosis not present

## 2021-12-01 DIAGNOSIS — Z6841 Body Mass Index (BMI) 40.0 and over, adult: Secondary | ICD-10-CM | POA: Insufficient documentation

## 2021-12-01 DIAGNOSIS — M4317 Spondylolisthesis, lumbosacral region: Secondary | ICD-10-CM | POA: Insufficient documentation

## 2021-12-01 DIAGNOSIS — E1140 Type 2 diabetes mellitus with diabetic neuropathy, unspecified: Secondary | ICD-10-CM | POA: Insufficient documentation

## 2021-12-01 DIAGNOSIS — M48061 Spinal stenosis, lumbar region without neurogenic claudication: Secondary | ICD-10-CM | POA: Insufficient documentation

## 2021-12-01 DIAGNOSIS — R079 Chest pain, unspecified: Secondary | ICD-10-CM | POA: Diagnosis not present

## 2021-12-01 DIAGNOSIS — Z01812 Encounter for preprocedural laboratory examination: Secondary | ICD-10-CM | POA: Insufficient documentation

## 2021-12-01 DIAGNOSIS — G4733 Obstructive sleep apnea (adult) (pediatric): Secondary | ICD-10-CM | POA: Diagnosis not present

## 2021-12-01 DIAGNOSIS — Z8616 Personal history of COVID-19: Secondary | ICD-10-CM

## 2021-12-01 DIAGNOSIS — E785 Hyperlipidemia, unspecified: Secondary | ICD-10-CM

## 2021-12-01 DIAGNOSIS — I10 Essential (primary) hypertension: Secondary | ICD-10-CM | POA: Insufficient documentation

## 2021-12-01 DIAGNOSIS — Z79899 Other long term (current) drug therapy: Secondary | ICD-10-CM | POA: Insufficient documentation

## 2021-12-01 DIAGNOSIS — Z01818 Encounter for other preprocedural examination: Secondary | ICD-10-CM

## 2021-12-01 DIAGNOSIS — Z9641 Presence of insulin pump (external) (internal): Secondary | ICD-10-CM

## 2021-12-01 DIAGNOSIS — E119 Type 2 diabetes mellitus without complications: Secondary | ICD-10-CM

## 2021-12-01 DIAGNOSIS — Z01810 Encounter for preprocedural cardiovascular examination: Secondary | ICD-10-CM

## 2021-12-01 DIAGNOSIS — E7800 Pure hypercholesterolemia, unspecified: Secondary | ICD-10-CM | POA: Diagnosis not present

## 2021-12-01 LAB — SARS CORONAVIRUS 2 (TAT 6-24 HRS): SARS Coronavirus 2: NEGATIVE

## 2021-12-01 LAB — TYPE AND SCREEN
ABO/RH(D): O NEG
Antibody Screen: NEGATIVE

## 2021-12-01 LAB — GLUCOSE, CAPILLARY
Glucose-Capillary: 70 mg/dL (ref 70–99)
Glucose-Capillary: 114 mg/dL — ABNORMAL HIGH (ref 70–99)
Glucose-Capillary: 60 mg/dL — ABNORMAL LOW (ref 70–99)

## 2021-12-01 LAB — SURGICAL PCR SCREEN
MRSA, PCR: NEGATIVE
Staphylococcus aureus: POSITIVE — AB

## 2021-12-01 NOTE — Progress Notes (Signed)
Stop the mounjaro to prevent lows

## 2021-12-01 NOTE — Progress Notes (Signed)
A1c looks great at 5.7%. Your labs had a low blood sugar at 55, have you had higher readings since the lab draw?  Cholesterol looks great.

## 2021-12-01 NOTE — Progress Notes (Signed)
Surgical Instructions    Your procedure is scheduled on December 05, 2021.  Report to [facility] Main Entrance "A" at 1:00 PM, then check in with the Admitting office.  Call this number if you have problems the morning of surgery:  [phone]   If you have any questions prior to your surgery date call [phone]: Open Monday-Friday 8am-4pm   Remember:  Do not eat after midnight the night before your surgery  You may drink clear liquids until 12:00pm the afternoon of your surgery.   Clear liquids allowed are: Water, Non-Citrus Juices (without pulp), Carbonated Beverages, Clear Tea, Black Coffee ONLY (NO MILK, CREAM OR POWDERED CREAMER of any kind), and Gatorade    Take these medicines the morning of surgery with A SIP OF WATER: Bupropion (Wellbutrin XL) Gabapentin (Neurontin) Omeprazole (Prilosec)  Simvastatin (Zocor)  If needed: Albuterol (Ventolin HFA) --Please bring all inhalers with you the day of surgery.  Cyclobenzaprine (Flexeril) Lorazepam (Ativan) Ondansetron (Zofran) Tramadol (Ultram)  WHAT DO I DO ABOUT MY DIABETES MEDICATION?   THE DAY BEFORE SURGERY, take normal dose of Insulin Lispro (Humalog).    THE MORNING OF SURGERY, DO NOT TAKE ANY Insulin Lispro (Humalog).  The day of surgery, do not take other diabetes injectables, including Byetta (exenatide), Bydureon (exenatide ER), Victoza (liraglutide), or Trulicity (dulaglutide).  If your CBG is greater than 220 mg/dL, you may take  of your sliding scale (correction) dose of insulin.   HOW TO MANAGE YOUR DIABETES BEFORE AND AFTER SURGERY  Why is it important to control my blood sugar before and after surgery? Improving blood sugar levels before and after surgery helps healing and can limit problems. A way of improving blood sugar control is eating a healthy diet by:  Eating less sugar and carbohydrates  Increasing activity/exercise  Talking with your doctor about reaching your blood sugar goals High  blood sugars (greater than 180 mg/dL) can raise your risk of infections and slow your recovery, so you will need to focus on controlling your diabetes during the weeks before surgery. Make sure that the doctor who takes care of your diabetes knows about your planned surgery including the date and location.  How do I manage my blood sugar before surgery? Check your blood sugar at least 4 times a day, starting 2 days before surgery, to make sure that the level is not too high or low.  Check your blood sugar the morning of your surgery when you wake up and every 2 hours until you get to the Short Stay unit.  If your blood sugar is less than 70 mg/dL, you will need to treat for low blood sugar: Do not take insulin. Treat a low blood sugar (less than 70 mg/dL) with  cup of clear juice (cranberry or apple), 4 glucose tablets, OR glucose gel. Recheck blood sugar in 15 minutes after treatment (to make sure it is greater than 70 mg/dL). If your blood sugar is not greater than 70 mg/dL on recheck, call [phone] for further instructions. Report your blood sugar to the short stay nurse when you get to Short Stay.  If you are admitted to the hospital after surgery: Your blood sugar will be checked by the staff and you will probably be given insulin after surgery (instead of oral diabetes medicines) to make sure you have good blood sugar levels. The goal for blood sugar control after surgery is 80-180 mg/dL.    As of today, STOP taking any Aspirin (unless otherwise instructed by your  surgeon) Aleve, Naproxen, Ibuprofen, Motrin, Advil, Goody's, BC's, all herbal medications, fish oil, and all vitamins.  Patients with Insulin Pumps    For patients with Insulin Pumps: Contact your diabetes doctor for specific instructions before surgery. Decrease basal insulin rates by 20% at midnight the night before surgery. Do not remove your insulin pump prior to arrival to short stay the morning of surgery.   Anesthesia will instruct you on when to remove your pump. Note that if your surgery is planned to be longer than 2 hours, your insulin pump will be removed and intravenous (IV) insulin will be started and managed by the nurses and anesthesiologist. You will be able to restart your insulin pump once you are awake and able to manage it. Make sure to bring insulin pump supplies to the hospital with you in case your site needs to be changed.    After your COVID test   You are not required to quarantine however you are required to wear a well-fitting mask when you are out and around people not in your household.  If your mask becomes wet or soiled, replace with a new one.  Wash your hands often with soap and water for 20 seconds or clean your hands with an alcohol-based hand sanitizer that contains at least 60% alcohol.  Do not share personal items.  Notify your provider: if you are in close contact with someone who has COVID  or if you develop a fever of 100.4 or greater, sneezing, cough, sore throat, shortness of breath or body aches.            Do not wear jewelry or makeup Do not wear lotions, powders, perfumes, or deodorant. Do not shave 48 hours prior to surgery.   Do not bring valuables to the hospital. DO Not wear nail polish, gel polish, artificial nails, or any other type of covering on natural nails including finger and toenails. If patients have artificial nails, gel coating, etc. that need to be removed by a nail salon, please have this removed prior to surgery or surgery may need to be canceled/delayed if the surgeon/ anesthesia feels like the patient is unable to be adequately monitored.             [facility] is not responsible for any belongings or valuables.  Do NOT Smoke (Tobacco/Vaping)  24 hours prior to your procedure  If you use a CPAP at night, you may bring your mask for your overnight stay.   Contacts, glasses, hearing aids, dentures or partials may not be worn  into surgery, please bring cases for these belongings   For patients admitted to the hospital, discharge time will be determined by your treatment team.   Patients discharged the day of surgery will not be allowed to drive home, and someone needs to stay with them for 24 hours.  NO VISITORS WILL BE ALLOWED IN PRE-OP WHERE PATIENTS ARE PREPPED FOR SURGERY.  ONLY 1 SUPPORT PERSON MAY BE PRESENT IN THE WAITING ROOM WHILE YOU ARE IN SURGERY.  IF YOU ARE TO BE ADMITTED, ONCE YOU ARE IN YOUR ROOM YOU WILL BE ALLOWED TWO (2) VISITORS. 1 (ONE) VISITOR MAY STAY OVERNIGHT BUT MUST ARRIVE TO THE ROOM BY 8pm.  Minor children may have two parents present. Special consideration for safety and communication needs will be reviewed on a case by case basis.  Special instructions:    Oral Hygiene is also important to reduce your risk of infection.  Remember - BRUSH  YOUR TEETH THE MORNING OF SURGERY WITH YOUR REGULAR TOOTHPASTE   [place]- Preparing For Surgery  Before surgery, you can play an important role. Because skin is not sterile, your skin needs to be as free of germs as possible. You can reduce the number of germs on your skin by washing with CHG (chlorahexidine gluconate) Soap before surgery.  CHG is an antiseptic cleaner which kills germs and bonds with the skin to continue killing germs even after washing.     Please do not use if you have an allergy to CHG or antibacterial soaps. If your skin becomes reddened/irritated stop using the CHG.  Do not shave (including legs and underarms) for at least 48 hours prior to first CHG shower. It is OK to shave your face.  Please follow these instructions carefully.     Shower the NIGHT BEFORE SURGERY and the MORNING OF SURGERY with CHG Soap.   If you chose to wash your hair, wash your hair first as usual with your normal shampoo. After you shampoo, rinse your hair and body thoroughly to remove the shampoo.  Then [employer] and genitals (private parts) with  your normal soap and rinse thoroughly to remove soap.  After that Use CHG Soap as you would any other liquid soap. You can apply CHG directly to the skin and wash gently with a scrungie or a clean washcloth.   Apply the CHG Soap to your body ONLY FROM THE NECK DOWN.  Do not use on open wounds or open sores. Avoid contact with your eyes, ears, mouth and genitals (private parts). Wash Face and genitals (private parts)  with your normal soap.   Wash thoroughly, paying special attention to the area where your surgery will be performed.  Thoroughly rinse your body with warm water from the neck down.  DO NOT shower/wash with your normal soap after using and rinsing off the CHG Soap.  Pat yourself dry with a CLEAN TOWEL.  Wear CLEAN PAJAMAS to bed the night before surgery  Place CLEAN SHEETS on your bed the night before your surgery  DO NOT SLEEP WITH PETS.   Day of Surgery:  Take a shower with CHG soap. Wear Clean/Comfortable clothing the morning of surgery Do not apply any deodorants/lotions.   Remember to brush your teeth WITH YOUR REGULAR TOOTHPASTE.   Please read over the following fact sheets that you were given.

## 2021-12-01 NOTE — Progress Notes (Signed)
Anesthesia Chart Review:  Case: [id number] Date/Time: 12/05/21 1445   Procedure: Open L5-S1 Laminectomy, TLIF, posterolateral fusion - RM 21/3C   Anesthesia type: General   Pre-op diagnosis: Isthmic spondylolisthesis   Location: [facility] OR ADD ON ROOM 01 / [place] OR   Surgeons: [name] Part, MD       DISCUSSION: Patient is a 55 year old female scheduled for the above procedure.   History includes never smoker, HTN, hypercholesterolemia, DM2 (on insulin pump), neuropathy, palpitations, dyspnea, dysphagia (2021; s/p Maloney dilation 06/23/20)), endometrial ablation (04/25/12), nephrolithiasis (s/p double J stent 05/29/12 for obstructive right pyonephrosis; right JJ stent 2021-2022, last 0/1/77), umbilical hernia repair (03/25/14), OSA, morbid obesity, COVID-19 (08/2019, 03/2021).   Saw PCP [name] Larsson, NP last on 11/29/21. She was initially established with him on 08/30/21 and had previously seen [name] Neighbors, MD. A1c 10.2% then, but now down to 5.7% 11/10/21. He referred her to endocrinology which is pending. She also had referral to pulmonology for worsening cough since COVID in 03/2021.    Seen by pulmonologist Dr. Melvyn Alvarado on 09/26/21 for cough and DOE in a never smoker. Has to rest on shopping cart after walking 1-2 laps at grocery store. Works as a Electrical engineer. Symptoms initially noted post-COVID 2020 and 03/2021 and felt likely due to upper airway cough syndrome. Lisinopril and air duo inhalers were stopped. Use SABA as needed. Prilosec was increased. Four week follow-up advised. Next visit is scheduled for 12/18/21.   Referred to cardiology by ED provider [name] Berkshire, MD following 10/10/21 ED visit for chest pain with some associated nausea and SOB. It did not seem worse with exertion. Insulin dependent diabetes, non smoker, no family history of early CAD. Troponin negative. D-dimer 0.5, negative by current lab cut off.  Her cardiology visit is scheduled with Dr. Marlou Alvarado on 12/07/21.    She has a CMP,  CBC with differential, A1c on 12/22/ as Alvarado of labs ordered by PCP. 12/01/21 COVID-19 test is in process.   Patient has pending cardiology evaluation for chest pain. She had normal coronaries with normal EF, but LHC was about 9 years ago. She has multiple CAD risk factors and was also recently evaluated by pulmonology for cough and DOE. For elective surgery, would recommend preoperative cardiology evaluation prior to lumbar fusion. I have notified [name] at Dr. Colleen Can office.    VS: BP 132/76    Pulse 80    Temp 36.5 C (Oral)    Resp 18    Ht 4' 9"  (1.448 m)    Wt 92.4 kg    LMP 07/16/2012    SpO2 99%    BMI 44.10 kg/m    PROVIDERS: [name] Larsson, NP is PCP  [name] Bang, MD is urologist.  [name] Gully, MD is pulmonologist [name] Rudd, MD is GI [name] Beckwith, MD is endocrinologist. Scheduled for 01/01/21. - She has a new patient cardiology evaluation with [name] Furbish, MD scheduled for 12/07/21 after ED visit for chest pain on 10/10/21. She has been seen previously by multiple cardiologist, last evaluated by [name] on 12/08/14.    LABS: See DISCUSSION. Last lab results include: Lab Results  Component Value Date   WBC 9.4 11/30/2021   HGB 13.6 11/30/2021   HCT 39.8 11/30/2021   PLT 306 11/30/2021   GLUCOSE 55 (L) 11/30/2021   CHOL 117 11/30/2021   TRIG 84 11/30/2021   HDL 47 11/30/2021   LDLCALC 53 11/30/2021   ALT 19 11/30/2021   AST 26  11/30/2021   NA 144 11/30/2021   K 3.9 11/30/2021   CL 102 11/30/2021   CREATININE 0.66 11/30/2021   BUN 8 11/30/2021   CO2 24 11/30/2021   HGBA1C 5.7 (H) 11/30/2021     OTHER: - 09/26/2021   Walked on RA  x  3  lap(s) =  approx 500 @ fast pace, stopped due to sob with lowest 02 sats 99%    IMAGES: MRI L-spine 11/06/21: IMPRESSION: 1. Unchanged grade 1 anterolisthesis of L5 on S1 with associated bilateral pars defects. A diffuse disc bulge with a left subarticular zone/foraminal extrusion is also  unchanged, resulting in mild left neural foraminal stenosis with possible contact of the exiting L5 nerve root. 2. Unchanged disc space narrowing with a small central protrusion and annular fissure at L4-L5. 3. Otherwise, mild multilevel degenerative changes throughout the remainder of the lumbar spine detailed above are also not significantly changed since 05/20/2019. No other evidence of significant spinal canal or neural foraminal stenosis, or nerve root impingement.   1V CXR 10/10/21: FINDINGS: The heart size and mediastinal contours are within normal limits. Both lungs are clear. The visualized skeletal structures are unremarkable. IMPRESSION: No active disease.    EKG: 10/10/21: Normal sinus rhythm Septal infarct (age undetermined)   CV: Cardiac event monitor 12/08/14-12/25/14: SR-ST. HR 58-130 bpm, average 85 bpm.    Cardiac cath 12/11/12: ANGIOGRAPHIC RESULTS:  1. Left main; normal  2. LAD; normal 3. Left circumflex; normal.  4. Right coronary artery; dominant and normal 5. Left ventriculography; RAO left ventriculogram was performed using  25 mL of Visipaque dye at 12 mL/second. The overall LVEF estimated  60 % Without wall motion abnormalities IMPRESSION:[name] has normal coronary arteries and normal ventricular function. I believe her chest pain is noncardiac.   Echo 11/19/12: Study Conclusions  - Left ventricle: The cavity size was normal. There was mild    concentric hypertrophy. Systolic function was normal. The    estimated ejection fraction was in the range of 55% to    60%. Wall motion was normal; there were no regional wall    motion abnormalities.  - Mitral valve: Calcified annulus.  - Atrial septum: No defect or patent foramen ovale was    identified.    Past Medical History:  Diagnosis Date   Diabetes mellitus    insulin pump 2013   Dysphagia 03/09/2020   Hypercholesteremia    Hypertension    Incarcerated umbilical hernia 05/23/4314    Kidney stone    Kidney stones 08/31/2008   Qualifier: Diagnosis of  By: Jonna Munro MD, Cornelius     Neuropathy    Obesity    Obstructive sleep apnea    Palpitations    Shortness of breath     Past Surgical History:  Procedure Laterality Date   BIOPSY  06/23/2020   Procedure: BIOPSY;  Surgeon: Daneil Dolin, MD;  Location: [facility] ENDO SUITE;  Service: Endoscopy;;   CARDIAC CATHETERIZATION  12/11/2012   normal coronary arteries   CESAREAN SECTION  1994;2000   [facility]   CHOLECYSTECTOMY  1992   COLONOSCOPY WITH PROPOFOL N/A 06/23/2020   large amount of stool in entire colon, prep inadequate.    CYSTOSCOPY W/ URETERAL STENT PLACEMENT  05/08/2012   Procedure: CYSTOSCOPY WITH RETROGRADE PYELOGRAM/URETERAL STENT PLACEMENT;  Surgeon: Marissa Nestle, MD;  Location: [facility] ORS;  Service: Urology;  Laterality: Right;   CYSTOSCOPY WITH RETROGRADE PYELOGRAM, URETEROSCOPY AND STENT PLACEMENT Right 05/04/2020   Procedure: CYSTOSCOPY WITH RIGHT RETROGRADE  PYELOGRAM, RIGHT URETEROSCOPY AND RIGHT URETERAL STENT EXCHANGE;  Surgeon: Cleon Gustin, MD;  Location: [facility] ORS;  Service: Urology;  Laterality: Right;   CYSTOSCOPY WITH RETROGRADE PYELOGRAM, URETEROSCOPY AND STENT PLACEMENT Left 08/08/2020   Procedure: CYSTOSCOPY WITH RETROGRADE PYELOGRAM, URETEROSCOPY WITH LASER  AND STENT PLACEMENT;  Surgeon: Cleon Gustin, MD;  Location: [facility] ORS;  Service: Urology;  Laterality: Left;   CYSTOSCOPY WITH RETROGRADE PYELOGRAM, URETEROSCOPY AND STENT PLACEMENT Right 11/08/2020   Procedure: CYSTOSCOPY WITH RIGHT RETROGRADE PYELOGRAM, RIGHT URETEROSCOPY AND STENT PLACEMENT;  Surgeon: Cleon Gustin, MD;  Location: [facility];  Service: Urology;  Laterality: Right;   CYSTOSCOPY WITH RETROGRADE PYELOGRAM, URETEROSCOPY AND STENT PLACEMENT Left 12/27/2020   Procedure: CYSTOSCOPY WITH LEFT RETROGRADE PYELOGRAM, LEFT URETEROSCOPY AND LEFT URETERAL STENT PLACEMENT;  Surgeon: Cleon Gustin, MD;  Location: [facility];  Service:  Urology;  Laterality: Left;   CYSTOSCOPY WITH RETROGRADE PYELOGRAM, URETEROSCOPY AND STENT PLACEMENT Bilateral 03/16/2021   Procedure: CYSTOSCOPY WITH BILATERAL  RETROGRADE PYELOGRAM, BILATERAL URETEROSCOPY AND STENT PLACEMENT ON THE LEFT;  Surgeon: Cleon Gustin, MD;  Location: [facility];  Service: Urology;  Laterality: Bilateral;   CYSTOSCOPY WITH STENT PLACEMENT Right 02/25/2020   Procedure: CYSTOSCOPY WITH RIGHT RETROGRADE RIGHT STENT PLACEMENT;  Surgeon: Festus Aloe, MD;  Location: [facility];  Service: Urology;  Laterality: Right;   ESOPHAGOGASTRODUODENOSCOPY (EGD) WITH PROPOFOL N/A 06/23/2020   Normal esophagus, multiple localized erosions were found in gastric antrum s/p biopsy, normal duodenum. Empiric dilation.Reactive gastropathy with erosions, negative H.pylori, no dysplasia.     EXTRACORPOREAL SHOCK WAVE LITHOTRIPSY Right 10/11/2020   Procedure: EXTRACORPOREAL SHOCK WAVE LITHOTRIPSY (ESWL);  Surgeon: Cleon Gustin, MD;  Location: [facility];  Service: Urology;  Laterality: Right;   FOOT SURGERY Right March, 2013   [facility]-removal of bone spur   HOLMIUM LASER APPLICATION  6/80/8811   Procedure: HOLMIUM LASER LITHOTRIPSY RIGHT URETERAL CALCULUS;  Surgeon: Cleon Gustin, MD;  Location: [facility];  Service: Urology;;   HYSTEROSCOPY WITH THERMACHOICE  04/25/2012   Procedure: HYSTEROSCOPY WITH THERMACHOICE;  Surgeon: Florian Buff, MD;  Location: [facility] OR[facility];  Service: Gynecology;  Laterality: N/A;  total therapy time= 11 minutes 42 seconds; 34 ml D5w  in and 34 ml D5w out; temp =87 degrees F   LEFT HEART CATHETERIZATION WITH CORONARY ANGIOGRAM N/A 12/11/2012   Procedure: LEFT HEART CATHETERIZATION WITH CORONARY ANGIOGRAM;  Surgeon: Lorretta Harp, MD;  Location: [facility] CATH LAB;  Service: Cardiovascular;  Laterality: N/A;   MALONEY DILATION N/A 06/23/2020   Procedure: Venia Minks DILATION;  Surgeon: Daneil Dolin, MD;  Location: [facility];  Service: Endoscopy;  Laterality: N/A;    Sinus sergery  1988   [place]   STONE EXTRACTION WITH BASKET Right 11/08/2020   Procedure: STONE EXTRACTION WITH BASKET;  Surgeon: Cleon Gustin, MD;  Location: [facility] OR[facility];  Service: Urology;  Laterality: Right;   UMBILICAL HERNIA REPAIR N/A 03/25/2014   Procedure: UMBILICAL HERNIA REPAIR;  Surgeon: Scherry Ran, MD;  Location: [facility] ORS;  Service: General;  Laterality: N/A;   uterine ablation      MEDICATIONS:  albuterol (VENTOLIN HFA) 108 (90 Base) MCG/ACT inhaler   ARIPiprazole (ABILIFY) 2 MG tablet   blood glucose meter kit and supplies   buPROPion (WELLBUTRIN XL) 150 MG 24 hr tablet   cyclobenzaprine (FLEXERIL) 5 MG tablet   diphenoxylate-atropine (LOMOTIL) 2.5-0.025 MG tablet   furosemide (LASIX) 20 MG tablet   gabapentin (NEURONTIN) 600 MG tablet   gabapentin (NEURONTIN)  600 MG tablet   glucose blood test strip   ibuprofen (ADVIL) 800 MG tablet   insulin lispro (HUMALOG) 100 UNIT/ML injection   Lancets (FREESTYLE) lancets   LORazepam (ATIVAN) 0.5 MG tablet   methocarbamol (ROBAXIN) 500 MG tablet   olmesartan (BENICAR) 20 MG tablet   omeprazole (PRILOSEC) 40 MG capsule   ondansetron (ZOFRAN) 4 MG tablet   oxybutynin (DITROPAN) 5 MG tablet   potassium chloride SA (KLOR-CON M15) 15 MEQ tablet   simvastatin (ZOCOR) 40 MG tablet   traMADol (ULTRAM) 50 MG tablet   No current facility-administered medications for this encounter.    Myra Gianotti, PA-C Surgical Short Stay/Anesthesiology [facility] Phone [phone] [facility] Phone [phone] 12/01/2021 10:37 AM

## 2021-12-01 NOTE — Progress Notes (Signed)
Cardiology Office Note    Date:  12/01/2021   ID:  [name], DOB [birth year], MRN [id number]  PCP:  [name], NP  Cardiologist:  Shelva Majestic, MD   New cardiology consultation referred today and seen as an add-on for cardiology clearance for planned surgery on December 05, 2021.   History of Present Illness:  [name] is a 55 y.o. female who has a history of diabetes mellitus on insulin pump, hypertension, hyperlipidemia, and remotely in 2013 she was seen by Dr. Gwenlyn Found and apparently was experiencing some atypical chest discomfort.  She underwent definitive cardiac catheterization on December 11, 2012 which was entirely normal.  Over the years, the patient has experienced some atypical chest pain.  She has a history of hyperlipidemia and has been on simvastatin 40 mg daily.  She has a history of insulin-dependent diabetes mellitus on insulin pump and also has had issues with neuropathy for which she has been on gabapentin.  She is status post double-J stenting in 2013 for obstructive right pyelonephrosis and right JJ stent in May 2021 and 2022.  She is scheduled to undergo open L5-S1 laminectomy, TLIF, posterior lateral fusion to be done by Dr. Deatra Ina on December 05, 2021 at [facility].  On October 10, 2021 the patient had developed nonexertional atypical chest pain which she described as more as a soreness which occurred throughout the day which did not get worse with activity.  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  That day she had mild nausea and ultimately she presented to [facility] emergency room and was evaluated by Dr. Estevan Ryder.  Her evaluation was negative for cardiac issues.  Troponins were normal, blood pressure was stable at 130/59 pulse was regular at 71.  D-dimer was negative.  Chest x-ray was unremarkable.  Due to some left leg discomfort lower extremity venous Doppler study was performed on  October 13, 2021 and did not show any DVT.  She had deep vein reflux in the common femoral vein and superior vein reflux in the saphenofemoral junction and great saphenous vein.  She was evaluated in October 2022 by Dr. Melvyn Novas for cough and mild shortness of breath.  It was felt that her cough was mediated by lisinopril and most likely she had upper airway cough syndrome.  Since that time, she has been off her ACE in addition and her blood pressure has been stable.  Today she presented to the hospital for preoperative laboratory.  Apparently she was told that she needed cardiology clearance to undergo the planned neurosurgery on December 05, 2021 with Dr. Venetia Constable.  She is added onto my schedule today for cardiology consultation and preoperative clearance.  Presently, she feels well.  She denies any chest pain or  recent change in exertional dyspnea.  She walks at least 3 days/week and denies any symptoms.  She is not unaware of any palpitations, presyncope or syncope.  She has never used tobacco.  She does not use alcohol.  She presents for evaluation.  Past Medical History:  Diagnosis Date   Diabetes mellitus    insulin pump 2013   Dysphagia 03/09/2020   Hypercholesteremia    Hypertension    Incarcerated umbilical hernia 7/79/3903   Kidney stone    Kidney stones 08/31/2008   Qualifier: Diagnosis of  By: Jonna Munro MD, Cornelius     Neuropathy    Obesity    Obstructive  sleep apnea    Palpitations    Shortness of breath     Past Surgical History:  Procedure Laterality Date   BIOPSY  06/23/2020   Procedure: BIOPSY;  Surgeon: Daneil Dolin, MD;  Location: [facility];  Service: Endoscopy;;   CARDIAC CATHETERIZATION  12/11/2012   normal coronary arteries   CESAREAN SECTION  1994;2000   [facility]   CHOLECYSTECTOMY  1992   COLONOSCOPY WITH PROPOFOL N/A 06/23/2020   large amount of stool in entire colon, prep inadequate.    CYSTOSCOPY W/ URETERAL STENT PLACEMENT  05/08/2012   Procedure:  CYSTOSCOPY WITH RETROGRADE PYELOGRAM/URETERAL STENT PLACEMENT;  Surgeon: Marissa Nestle, MD;  Location: [facility]S;  Service: Urology;  Laterality: Right;   CYSTOSCOPY WITH RETROGRADE PYELOGRAM, URETEROSCOPY AND STENT PLACEMENT Right 05/04/2020   Procedure: CYSTOSCOPY WITH RIGHT RETROGRADE PYELOGRAM, RIGHT URETEROSCOPY AND RIGHT URETERAL STENT EXCHANGE;  Surgeon: Cleon Gustin, MD;  Location: [facility];  Service: Urology;  Laterality: Right;   CYSTOSCOPY WITH RETROGRADE PYELOGRAM, URETEROSCOPY AND STENT PLACEMENT Left 08/08/2020   Procedure: CYSTOSCOPY WITH RETROGRADE PYELOGRAM, URETEROSCOPY WITH LASER  AND STENT PLACEMENT;  Surgeon: Cleon Gustin, MD;  Location: [facility];  Service: Urology;  Laterality: Left;   CYSTOSCOPY WITH RETROGRADE PYELOGRAM, URETEROSCOPY AND STENT PLACEMENT Right 11/08/2020   Procedure: CYSTOSCOPY WITH RIGHT RETROGRADE PYELOGRAM, RIGHT URETEROSCOPY AND STENT PLACEMENT;  Surgeon: Cleon Gustin, MD;  Location: [facility];  Service: Urology;  Laterality: Right;   CYSTOSCOPY WITH RETROGRADE PYELOGRAM, URETEROSCOPY AND STENT PLACEMENT Left 12/27/2020   Procedure: CYSTOSCOPY WITH LEFT RETROGRADE PYELOGRAM, LEFT URETEROSCOPY AND LEFT URETERAL STENT PLACEMENT;  Surgeon: Cleon Gustin, MD;  Location: [facility];  Service: Urology;  Laterality: Left;   CYSTOSCOPY WITH RETROGRADE PYELOGRAM, URETEROSCOPY AND STENT PLACEMENT Bilateral 03/16/2021   Procedure: CYSTOSCOPY WITH BILATERAL  RETROGRADE PYELOGRAM, BILATERAL URETEROSCOPY AND STENT PLACEMENT ON THE LEFT;  Surgeon: Cleon Gustin, MD;  Location: [facility];  Service: Urology;  Laterality: Bilateral;   CYSTOSCOPY WITH STENT PLACEMENT Right 02/25/2020   Procedure: CYSTOSCOPY WITH RIGHT RETROGRADE RIGHT STENT PLACEMENT;  Surgeon: Festus Aloe, MD;  Location: [facility];  Service: Urology;  Laterality: Right;   ESOPHAGOGASTRODUODENOSCOPY (EGD) WITH PROPOFOL N/A 06/23/2020   Normal esophagus, multiple localized erosions were found in  gastric antrum s/p biopsy, normal duodenum. Empiric dilation.Reactive gastropathy with erosions, negative H.pylori, no dysplasia.     EXTRACORPOREAL SHOCK WAVE LITHOTRIPSY Right 10/11/2020   Procedure: EXTRACORPOREAL SHOCK WAVE LITHOTRIPSY (ESWL);  Surgeon: Cleon Gustin, MD;  Location: [facility];  Service: Urology;  Laterality: Right;   FOOT SURGERY Right March, 2013   [facility]-removal of bone spur   HOLMIUM LASER APPLICATION  9/75/8832   Procedure: HOLMIUM LASER LITHOTRIPSY RIGHT URETERAL CALCULUS;  Surgeon: Cleon Gustin, MD;  Location: [facility];  Service: Urology;;   HYSTEROSCOPY WITH THERMACHOICE  04/25/2012   Procedure: HYSTEROSCOPY WITH THERMACHOICE;  Surgeon: Florian Buff, MD;  Location: [facility];  Service: Gynecology;  Laterality: N/A;  total therapy time= 11 minutes 42 seconds; 34 ml D5w  in and 34 ml D5w out; temp =87 degrees F   LEFT HEART CATHETERIZATION WITH CORONARY ANGIOGRAM N/A 12/11/2012   Procedure: LEFT HEART CATHETERIZATION WITH CORONARY ANGIOGRAM;  Surgeon: Lorretta Harp, MD;  Location: [facility] CATH LAB;  Service: Cardiovascular;  Laterality: N/A;   MALONEY DILATION N/A 06/23/2020   Procedure: Venia Minks DILATION;  Surgeon: Daneil Dolin, MD;  Location[facility];  Service: Endoscopy;  Laterality: N/A;  Sinus sergery  1988   [place]   STONE EXTRACTION WITH BASKET Right 11/08/2020   Procedure: STONE EXTRACTION WITH BASKET;  Surgeon: Cleon Gustin, MD;  Location: [facility];  Service: Urology;  Laterality: Right;   UMBILICAL HERNIA REPAIR N/A 03/25/2014   Procedure: UMBILICAL HERNIA REPAIR;  Surgeon: Scherry Ran, MD;  Location: [facility];  Service: General;  Laterality: N/A;   uterine ablation      Current Medications: Outpatient Medications Prior to Visit  Medication Sig Dispense Refill   albuterol (VENTOLIN HFA) 108 (90 Base) MCG/ACT inhaler Inhale 2 puffs into the lungs every 6 (six) hours as needed for wheezing or shortness of breath.      blood  glucose meter kit and supplies Use up to four times daily as directed. 1 each 0   gabapentin (NEURONTIN) 600 MG tablet TAKE 1 TABLET BY MOUTH THREE TIMES DAILY 90 tablet 2   glucose blood test strip Test up to 4 times a day 100 each 0   insulin lispro (HUMALOG) 100 UNIT/ML injection Inject 80 units under the skin via omnipod daily 40 mL 0   Lancets (FREESTYLE) lancets Use to test 4 times daily 100 each 0   ondansetron (ZOFRAN) 4 MG tablet Take 1 tablet (4 mg total) by mouth every 8 (eight) hours as needed for nausea or vomiting. 20 tablet 0   potassium chloride SA (KLOR-CON M15) 15 MEQ tablet Take 15 mEq by mouth 2 (two) times daily.     simvastatin (ZOCOR) 40 MG tablet Take 40 mg by mouth daily.      ARIPiprazole (ABILIFY) 2 MG tablet Take 2 mg by mouth at bedtime.  (Patient not taking: Reported on 12/01/2021)     buPROPion (WELLBUTRIN XL) 150 MG 24 hr tablet Take 150 mg by mouth daily. (Patient not taking: Reported on 12/01/2021)     cyclobenzaprine (FLEXERIL) 5 MG tablet Take 1 tablet (5 mg total) by mouth 3 (three) times daily as needed for muscle spasms. (Patient not taking: Reported on 12/01/2021) 30 tablet 0   diphenoxylate-atropine (LOMOTIL) 2.5-0.025 MG tablet Take 1 tablet by mouth 4 (four) times daily as needed for diarrhea or loose stools. (Patient not taking: Reported on 12/01/2021) 20 tablet 0   furosemide (LASIX) 20 MG tablet Take 40 mg by mouth daily. (Patient not taking: Reported on 12/01/2021)     gabapentin (NEURONTIN) 600 MG tablet Take one tablet by mouth twice daily , and take two at bedtime (Patient not taking: Reported on 12/01/2021) 120 tablet 1   ibuprofen (ADVIL) 800 MG tablet Take 1 tablet (800 mg total) by mouth 3 (three) times daily. (Patient not taking: Reported on 12/01/2021) 21 tablet 0   LORazepam (ATIVAN) 0.5 MG tablet Take one tablet 30 minutes before test, may repeat one time only,  after 15 minutes if needed, for anxiety (Patient not taking: Reported on  12/01/2021) 2 tablet 0   methocarbamol (ROBAXIN) 500 MG tablet Take 1 tablet (500 mg total) by mouth at bedtime as needed for muscle spasms. (Patient not taking: Reported on 12/01/2021) 20 tablet 0   olmesartan (BENICAR) 20 MG tablet One tablet daily (Patient not taking: Reported on 12/01/2021)     omeprazole (PRILOSEC) 40 MG capsule TAKE 1 CAPSULE(40 MG) BY MOUTH DAILY (Patient not taking: Reported on 12/01/2021) 30 capsule 0   oxybutynin (DITROPAN) 5 MG tablet Take 5 mg by mouth at bedtime.  (Patient not taking: Reported on 12/01/2021)     traMADol (ULTRAM) 50 MG  tablet Take 1 tablet (50 mg total) by mouth every 12 (twelve) hours as needed for moderate pain or severe pain. (Patient not taking: Reported on 12/01/2021) 20 tablet 0   No facility-administered medications prior to visit.     Allergies:   Ciprofloxacin, Penicillins, Codeine, Morphine, Sulfonamide derivatives, and Vancomycin   Social History   Socioeconomic History   Marital status: Married    Spouse name: Not on file   Number of children: 2   Years of education: college   Highest education level: Not on file  Occupational History   Occupation: CNA    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: [employer]   Occupation: Quarry manager- at [employer]  Tobacco Use   Smoking status: Never   Smokeless tobacco: Never  Vaping Use   Vaping Use: Never used  Substance and Sexual Activity   Alcohol use: No   Drug use: No   Sexual activity: Yes    Partners: Male    Comment: ablation  Other Topics Concern   Not on file  Social History Narrative   Regular exercise: walks Caffeine use:    Social Determinants of Health   Financial Resource Strain: Not on file  Food Insecurity: Not on file  Transportation Needs: Not on file  Physical Activity: Not on file  Stress: Not on file  Social Connections: Not on file    Socially she is married for 10 years, has 2 children and 3 grandchildren.  She works as a Chartered certified accountant at [employer].   She completed 2 years of college at [employer].  No tobacco history or alcohol use.  She does not use recreational  drugs.  She walks at least 3 days/week.  Family History:  The patient's family history includes Achalasia in an other family member; Cancer in her mother, paternal uncle, and another family member; Depression in her paternal grandfather and paternal grandmother; Diabetes in her mother and another family member; Heart disease in an other family member.   Both parents alive at age 45.  Her mother has dementia.  She has 1 sister.  She has a son age 58 and a daughter age 77  ROS General: Negative; No fevers, chills, or night sweats; morbid obesity HEENT: Negative; No changes in vision or hearing, sinus congestion, difficulty swallowing Pulmonary: Negative; No cough, wheezing, shortness of breath, hemoptysis Cardiovascular: Negative; No chest pain, presyncope, syncope, palpitations GI: Negative; No nausea, vomiting, diarrhea, or abdominal pain GU: History of nephrolithiasis and stenting  Musculoskeletal: Negative; no myalgias, joint pain, or weakness Hematologic/Oncology: Negative; no easy bruising, bleeding Endocrine: Negative; no heat/cold intolerance; no diabetes Neuro: Negative; no changes in balance, headaches; neuropathy Skin: Negative; No rashes or skin lesions Psychiatric: Negative; No behavioral problems, depression Sleep: Negative; No snoring, daytime sleepiness, hypersomnolence, bruxism, restless legs, hypnogognic hallucinations, no cataplexy  Epworth Sleepiness Scale score was calculated in the office today and this endorsed at 4 arguing against excessive daytime sleepiness.  Other comprehensive 14 point system review is negative.   PHYSICAL EXAM:   VS:  BP 138/72    Pulse 76    Ht _0  (1.448 m)    Wt 205 lb 12.8 oz (93.4 kg)    LMP 07/16/2012    SpO2 97%    BMI 44.53 kg/m     Repeat blood pressure by me was 130/70  Wt Readings from Last 3  Encounters:  12/01/21 205 lb 12.8 oz (93.4 kg)  12/01/21 203 lb 12.8 oz (  92.4 kg)  11/29/21 206 lb 0.6 oz (93.5 kg)    General: Alert, oriented, no distress.  Morbid obesity Skin: normal turgor, no rashes, warm and dry HEENT: Normocephalic, atraumatic. Pupils equal round and reactive to light; sclera anicteric; extraocular muscles intact;  Nose without nasal septal hypertrophy Mouth/Parynx benign; Mallinpatti scale 3 Neck: Thick neck; no JVD, no carotid bruits; normal carotid upstroke Lungs: clear to ausculatation and percussion; no wheezing or rales Chest wall: without tenderness to palpitation Heart: PMI not displaced, RRR, s1 s2 normal, 1/6 systolic murmur, no diastolic murmur, no rubs, gallops, thrills, or heaves Abdomen: Central adiposity soft, nontender; no hepatosplenomehaly, BS+; abdominal aorta nontender and not dilated by palpation. Back: no CVA tenderness Pulses 2+ Musculoskeletal: full range of motion, normal strength, no joint deformities Extremities: no clubbing cyanosis or edema, Homan's sign negative  Neurologic: grossly nonfocal; Cranial nerves grossly wnl Psychologic: Normal mood and affect   Studies/Labs Reviewed:   ECG (independently read by me): ECG (independently read by me): NSR at 76, no ectopy, no ST changes; norma ECG  Recent Labs: BMP Latest Ref Rng & Units 11/30/2021 10/10/2021 09/21/2021  Glucose 70 - 99 mg/dL 55(L) 107(H) 110(H)  BUN 6 - 24 mg/dL _0 Creatinine 0.57 - 1.00 mg/dL 0.66 0.75 0.87  BUN/Creat Ratio 9 - 23 12 - 16  Sodium 134 - 144 mmol/L 144 138 138  Potassium 3.5 - 5.2 mmol/L 3.9 3.4(L) 4.0  Chloride 96 - 106 mmol/L 102 103 97  CO2 20 - 29 mmol/L _1 Calcium 8.7 - 10.2 mg/dL 9.3 8.6(L) 8.9     Hepatic Function Latest Ref Rng & Units 11/30/2021 10/10/2021 08/30/2021  Total Protein 6.0 - 8.5 g/dL 6.9 6.8 6.7  Albumin 3.8 - 4.9 g/dL 4.4 3.6 4.3  AST 0 - 40 IU/L _2 ALT 0 - 32 IU/L _3 Alk Phosphatase 44 - 121  IU/L 128(H) 96 143(H)  Total Bilirubin 0.0 - 1.2 mg/dL 0.8 0.6 0.6  Bilirubin, Direct 0.0 - 0.3 mg/dL - - -    CBC Latest Ref Rng & Units 11/30/2021 10/10/2021 08/30/2021  WBC 3.4 - 10.8 x10E3/uL 9.4 8.8 10.5  Hemoglobin 11.1 - 15.9 g/dL 13.6 12.5 13.6  Hematocrit 34.0 - 46.6 % 39.8 37.6 39.3  Platelets 150 - 450 x10E3/uL 306 294 356   Lab Results  Component Value Date   MCV 83 11/30/2021   MCV 86.2 10/10/2021   MCV 82 08/30/2021   Lab Results  Component Value Date   TSH 1.438 11/18/2012   Lab Results  Component Value Date   HGBA1C 5.7 (H) 11/30/2021     BNP    Component Value Date/Time   BNP 20.0 10/10/2021 0153    ProBNP No results found for: PROBNP   Lipid Panel     Component Value Date/Time   CHOL 117 11/30/2021 1415   TRIG 84 11/30/2021 1415   HDL 47 11/30/2021 1415   CHOLHDL 4.0 07/20/2013 1100   VLDL 48 (H) 07/20/2013 1100   LDLCALC 53 11/30/2021 1415   LABVLDL 17 11/30/2021 1415     RADIOLOGY: MR Lumbar Spine W Wo Contrast  Result Date: 11/06/2021 CLINICAL DATA:  Low back pain radiating to both legs EXAM: MRI LUMBAR SPINE WITHOUT AND WITH CONTRAST TECHNIQUE: Multiplanar and multiecho pulse sequences of the lumbar spine were obtained without and with intravenous contrast. CONTRAST:  35m GADAVIST GADOBUTROL 1 MMOL/ML IV SOLN COMPARISON:  Lumbar spine radiographs 10/18/2021,  lumbar spine MRI 05/20/2019 FINDINGS: Segmentation: Standard; the lowest formed disc space is designated L5-S1 Alignment: There is trace grade 1 anterolisthesis of L5 on S1 with associated bilateral pars defects, unchanged. Alignment is otherwise normal. Vertebrae: Vertebral body heights are preserved. There is no suspicious marrow signal abnormality. There is no abnormal marrow enhancement. Conus medullaris and cauda equina: Conus extends to the mid L1 level. Conus and cauda equina appear normal. Paraspinal and other soft tissues: Negative. Disc levels: There is disc desiccation and  narrowing most advanced at L4-L5, similar to the prior study. There is mild multilevel facet arthropathy, most advanced at L4-L5 and L5-S1. T12-L1: There is a small protrusion without significant spinal canal or neural foraminal stenosis. L1-L2: There is a small protrusion without significant spinal canal or neural foraminal stenosis. L2-L3: There is a minimal disc protrusion without significant spinal canal or neural foraminal stenosis. L3-L4: There is a minimal disc protrusion without significant spinal canal or neural foraminal stenosis. L4-L5: There is a mild central protrusion and annular fissure, mild degenerative endplate change, and bilateral facet arthropathy resulting in mild crowding of the right subarticular zone without evidence of nerve root impingement, and mild bilateral neural foraminal stenosis, unchanged. L5-S1: There is a diffuse disc bulge with a small left subarticular zone/foraminal superiorly migrated extrusion, mild degenerative endplate change, and bilateral facet arthropathy resulting in mild left neural foraminal stenosis with possible contact of the exiting L5 nerve root, and no significant spinal canal or right neural foraminal stenosis. Findings are unchanged. IMPRESSION: 1. Unchanged grade 1 anterolisthesis of L5 on S1 with associated bilateral pars defects. A diffuse disc bulge with a left subarticular zone/foraminal extrusion is also unchanged, resulting in mild left neural foraminal stenosis with possible contact of the exiting L5 nerve root. 2. Unchanged disc space narrowing with a small central protrusion and annular fissure at L4-L5. 3. Otherwise, mild multilevel degenerative changes throughout the remainder of the lumbar spine detailed above are also not significantly changed since 05/20/2019. No other evidence of significant spinal canal or neural foraminal stenosis, or nerve root impingement. Electronically Signed   By: Valetta Mole M.D.   On: 11/06/2021 15:41      Additional studies/ records that were reviewed today include:  I reviewed the patient's prior records from 2015 when she last saw Dr. Gwenlyn Found.  I reviewed her recent [facility] emergency evaluation with associated studies and laboratory.  I reviewed her recent note by Myra Gianotti PA-C prior to her planned surgery who recommended her preoperative cardiology evaluation prior to lumbar fusion.    ASSESSMENT:    1. Preop cardiovascular exam   2. Hyperlipidemia, unspecified hyperlipidemia type   3. Spondylolisthesis at L5-S1 level   4. Diabetes mellitus type 2, uncomplicated, on long term insulin pump (HCC)   5. Morbid obesity ([place])   6. History of COVID-19     PLAN:  [name] is a pleasant 55 year old female who has cardiac risk factors including insulin-dependent diabetes mellitus on insulin pump, hyperlipidemia on statin therapy, remote history of palpitations, and reportedly has a history of hypertension.  However, she currently is not on any hypertensive medications her blood pressure today is stable with recheck by me and 130/70.  She had experienced atypical chest pain remotely and had undergone cardiac catheterization in 2014 which was entirely normal.  She denies any anginal symptomatology.  He denies any chest tightness or exercise-induced abnormalities.  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  Her ECG today is normal.  She is morbidly obese with BMI today at 44.5 but apparently has lost 16 pounds over the past 6 weeks purposefully.  She is scheduled to undergo her open L5-S1 laminectomy, TLIF, posterior lateral fusion surgery for an isthmic spondylitic listhesis with Dr. Deatra Ina on December 05, 2021.  I feel she is stable from a cardiovascular standpoint to undergo the surgery and have given clearance for this procedure to proceed as scheduled.  In the future, with her cardiovascular risk factors, it may be  prudent to consider coronary calcium score and if coronary calcification is present initiate aggressive lowering therapy with target LDL less than 70.   In the future, a 2D echo Doppler study would be helpful to assess both systolic and diastolic function.  Most recent lipid panel from September 2022 reveals a mixed hyperlipidemic pattern with total cholesterol 186, LDL 113, and triglycerides at 190.  I discussed the importance of exercise, improve diet with a heart healthy Mediterranean diet and additional weight reduction.   Medication Adjustments/Labs and Tests Ordered: Current medicines are reviewed at length with the patient today.  Concerns regarding medicines are outlined above.  Medication changes, Labs and Tests ordered today are listed in the Patient Instructions below. Patient Instructions  Medication Instructions:  The current medical regimen is effective;  continue present plan and medications.  *If you need a refill on your cardiac medications before your next appointment, please call your pharmacy*   Follow-Up: At [facility], you and your health needs are our priority.  As part of our continuing mission to provide you with exceptional heart care, we have created designated Provider Care Teams.  These Care Teams include your primary Cardiologist (physician) and Advanced Practice Providers (APPs -  Physician Assistants and Nurse Practitioners) who all work together to provide you with the care you need, when you need it.  We recommend signing up for the patient portal called "MyChart".  Sign up information is provided on this After Visit Summary.  MyChart is used to connect with patients for Virtual Visits (Telemedicine).  Patients are able to view lab/test results, encounter notes, upcoming appointments, etc.  Non-urgent messages can be sent to your provider as well.   To learn more about what you can do with MyChart, go to [email or web address].    Your next appointment:   As  needed  The format for your next appointment:   In Person  Provider:   Shelva Majestic, MD       Signed, Shelva Majestic, MD  12/01/2021 5:47 PM    [facility] [address], [place], [place], [place]  [address] Phone: [phone]

## 2021-12-01 NOTE — Progress Notes (Addendum)
PCP - Bjorn Pippin, NP Cardiologist - Armanda Magic, MD  PPM/ICD - denies Device Orders - n/a Rep Notified - n/a  Chest x-ray - n/a EKG - 10/10/2021 Stress Test - 11/19/2012 ECHO - 11/19/2012 Cardiac Cath - 12/11/2012  Sleep Study - yes, positive for OSA CPAP - n/a  Fasting Blood Sugar - 80 - 120 Checks Blood Sugar 3 times a day CBG today - 70 A1C - 5.7 on 11/30/2021  Patient on insulin pump, was instructed: For patients with Insulin Pumps: Contact your diabetes doctor for specific instructions before surgery. Decrease basal insulin rates by 20% at midnight the night before surgery. Do not remove your insulin pump prior to arrival to short stay the morning of surgery.  Anesthesia will instruct you on when to remove your pump. Note that if your surgery is planned to be longer than 2 hours, your insulin pump will be removed and intravenous (IV) insulin will be started and managed by the nurses and anesthesiologist. You will be able to restart your insulin pump once you are awake and able to manage it. Make sure to bring insulin pump supplies to the hospital with you in case your site needs to be changed.   Patient verbalized understanding.  The diabetes coordinator was informed about patient having surgery on 12/05/2021.  Blood Thinner Instructions: n/a  Aspirin Instructions: Patient was instructed: As of today, STOP taking any Aspirin (unless otherwise instructed by your surgeon) Aleve, Naproxen, Ibuprofen, Motrin, Advil, Goody's, BC's, all herbal medications, fish oil, and all vitamins.    ERAS Protcol - yes PRE-SURGERY Ensure or G2- no  COVID TEST- done in PAT on 12/01/2021   Anesthesia review: yes - cardiac history. CBG in PAT 70 - patient verbalized that she didn't eat this morning. After 250 ml Ensure CBG went up to 114. Patient denied any distress.  Patient denies shortness of breath, fever, cough and chest pain at PAT appointment   All instructions explained to  the patient, with a verbal understanding of the material. Patient agrees to go over the instructions while at home for a better understanding. Patient also instructed to self quarantine after being tested for COVID-19. The opportunity to ask questions was provided.

## 2021-12-01 NOTE — Patient Instructions (Signed)
Medication Instructions:  ?The current medical regimen is effective;  continue present plan and medications. ? ?*If you need a refill on your cardiac medications before your next appointment, please call your pharmacy* ? ? ?Follow-Up: ?At [facility], you and your health needs are our priority.  As part of our continuing mission to provide you with exceptional heart care, we have created designated Provider Care Teams.  These Care Teams include your primary Cardiologist (physician) and Advanced Practice Providers (APPs -  Physician Assistants and Nurse Practitioners) who all work together to provide you with the care you need, when you need it. ? ?We recommend signing up for the patient portal called "MyChart".  Sign up information is provided on this After Visit Summary.  MyChart is used to connect with patients for Virtual Visits (Telemedicine).  Patients are able to view lab/test results, encounter notes, upcoming appointments, etc.  Non-urgent messages can be sent to your provider as well.   ?To learn more about what you can do with MyChart, go to [email or web address].   ? ?Your next appointment:   ?As needed ? ?The format for your next appointment:   ?In Person ? ?Provider:   ?Thomas Kelly, MD  ? ? ? ? ? ? ? ?

## 2021-12-05 ENCOUNTER — Ambulatory Visit (HOSPITAL_COMMUNITY): Payer: No Typology Code available for payment source | Admitting: Vascular Surgery

## 2021-12-05 ENCOUNTER — Other Ambulatory Visit: Payer: Self-pay

## 2021-12-05 ENCOUNTER — Encounter (HOSPITAL_COMMUNITY): Admission: RE | Disposition: A | Discharge status: Home / Self Care | Payer: Self-pay | Attending: Neurological Surgery

## 2021-12-05 ENCOUNTER — Ambulatory Visit (HOSPITAL_COMMUNITY): Payer: No Typology Code available for payment source

## 2021-12-05 ENCOUNTER — Encounter (HOSPITAL_COMMUNITY): Payer: Self-pay | Admitting: Neurological Surgery

## 2021-12-05 ENCOUNTER — Observation Stay (HOSPITAL_COMMUNITY)
Admission: RE | Admit: 2021-12-05 | Discharge: 2021-12-06 | Disposition: A | Discharge status: Home / Self Care | Payer: No Typology Code available for payment source | Source: Other Acute Inpatient Hospital | Attending: Neurological Surgery | Admitting: Neurological Surgery

## 2021-12-05 ENCOUNTER — Ambulatory Visit (HOSPITAL_COMMUNITY)
Admission: RE | Admit: 2021-12-05 | Payer: No Typology Code available for payment source | Source: Home / Self Care | Admitting: Neurological Surgery

## 2021-12-05 ENCOUNTER — Other Ambulatory Visit: Payer: Self-pay | Admitting: Neurological Surgery

## 2021-12-05 ENCOUNTER — Encounter (HOSPITAL_COMMUNITY): Admission: RE | Payer: Self-pay | Source: Home / Self Care

## 2021-12-05 DIAGNOSIS — I10 Essential (primary) hypertension: Secondary | ICD-10-CM | POA: Insufficient documentation

## 2021-12-05 DIAGNOSIS — M5416 Radiculopathy, lumbar region: Secondary | ICD-10-CM | POA: Diagnosis not present

## 2021-12-05 DIAGNOSIS — E119 Type 2 diabetes mellitus without complications: Secondary | ICD-10-CM | POA: Insufficient documentation

## 2021-12-05 DIAGNOSIS — Z419 Encounter for procedure for purposes other than remedying health state, unspecified: Secondary | ICD-10-CM

## 2021-12-05 DIAGNOSIS — M4317 Spondylolisthesis, lumbosacral region: Secondary | ICD-10-CM | POA: Diagnosis not present

## 2021-12-05 DIAGNOSIS — M4316 Spondylolisthesis, lumbar region: Secondary | ICD-10-CM | POA: Diagnosis present

## 2021-12-05 HISTORY — PX: TRANSFORAMINAL LUMBAR INTERBODY FUSION (TLIF) WITH PEDICLE SCREW FIXATION 1 LEVEL: SHX6141

## 2021-12-05 LAB — GLUCOSE, CAPILLARY
Glucose-Capillary: 111 mg/dL — ABNORMAL HIGH (ref 70–99)
Glucose-Capillary: 94 mg/dL (ref 70–99)
Glucose-Capillary: 142 mg/dL — ABNORMAL HIGH (ref 70–99)
Glucose-Capillary: 145 mg/dL — ABNORMAL HIGH (ref 70–99)
Glucose-Capillary: 146 mg/dL — ABNORMAL HIGH (ref 70–99)

## 2021-12-05 LAB — ABO/RH: ABO/RH(D): O NEG

## 2021-12-05 SURGERY — TRANSFORAMINAL LUMBAR INTERBODY FUSION (TLIF) WITH PEDICLE SCREW FIXATION 1 LEVEL
Anesthesia: General

## 2021-12-05 SURGERY — TRANSFORAMINAL LUMBAR INTERBODY FUSION (TLIF) WITH PEDICLE SCREW FIXATION 1 LEVEL
Anesthesia: General | Site: Back

## 2021-12-05 MED ORDER — CHLORHEXIDINE GLUCONATE CLOTH 2 % EX PADS: 6.0000 | MEDICATED_PAD | Freq: Once | CUTANEOUS | Status: DC

## 2021-12-05 MED ORDER — PROMETHAZINE HCL 25 MG/ML IJ SOLN
6.2500 mg | INTRAMUSCULAR | Status: DC | PRN
  Administered 2021-12-05: 23:00:00 6.25 mg via INTRAVENOUS

## 2021-12-05 MED ORDER — INSULIN REGULAR(HUMAN) IN NACL 100-0.9 UT/100ML-% IV SOLN
INTRAVENOUS | Status: DC
  Filled 2021-12-05: qty 100

## 2021-12-05 MED ORDER — ACETAMINOPHEN 650 MG RE SUPP: 650.0000 mg | RECTAL | Status: DC | PRN

## 2021-12-05 MED ORDER — CHLORHEXIDINE GLUCONATE 0.12 % MT SOLN
OROMUCOSAL | Status: AC
  Administered 2021-12-05: 14:00:00 15 mL via OROMUCOSAL
  Filled 2021-12-05: qty 15

## 2021-12-05 MED ORDER — CHLORHEXIDINE GLUCONATE 0.12 % MT SOLN: 15.0000 mL | Freq: Once | OROMUCOSAL | Status: AC

## 2021-12-05 MED ORDER — MENTHOL 3 MG MT LOZG: 1.0000 | LOZENGE | OROMUCOSAL | Status: DC | PRN

## 2021-12-05 MED ORDER — HYDROMORPHONE HCL 1 MG/ML IJ SOLN
INTRAMUSCULAR | Status: AC
  Filled 2021-12-05: qty 1

## 2021-12-05 MED ORDER — ONDANSETRON HCL 4 MG/2ML IJ SOLN
INTRAMUSCULAR | Status: AC
  Filled 2021-12-05: qty 2

## 2021-12-05 MED ORDER — MEPERIDINE HCL 25 MG/ML IJ SOLN: 6.2500 mg | INTRAMUSCULAR | Status: DC | PRN

## 2021-12-05 MED ORDER — ONDANSETRON HCL 4 MG PO TABS: 4.0000 mg | ORAL_TABLET | Freq: Four times a day (QID) | ORAL | Status: DC | PRN

## 2021-12-05 MED ORDER — LIDOCAINE-EPINEPHRINE 1 %-1:100000 IJ SOLN
INTRAMUSCULAR | Status: DC | PRN
  Administered 2021-12-05: 10 mL

## 2021-12-05 MED ORDER — SUGAMMADEX SODIUM 200 MG/2ML IV SOLN
INTRAVENOUS | Status: DC | PRN
  Administered 2021-12-05: 400 mg via INTRAVENOUS

## 2021-12-05 MED ORDER — DOCUSATE SODIUM 100 MG PO CAPS
100.0000 mg | ORAL_CAPSULE | Freq: Two times a day (BID) | ORAL | Status: DC
  Administered 2021-12-05 – 2021-12-06 (×2): 100 mg via ORAL
  Filled 2021-12-05 (×2): qty 1

## 2021-12-05 MED ORDER — ROCURONIUM BROMIDE 10 MG/ML (PF) SYRINGE
PREFILLED_SYRINGE | INTRAVENOUS | Status: AC
  Filled 2021-12-05: qty 10

## 2021-12-05 MED ORDER — CYCLOBENZAPRINE HCL 10 MG PO TABS
10.0000 mg | ORAL_TABLET | Freq: Three times a day (TID) | ORAL | Status: DC | PRN
  Administered 2021-12-05 – 2021-12-06 (×2): 10 mg via ORAL
  Filled 2021-12-05 (×2): qty 1

## 2021-12-05 MED ORDER — PROMETHAZINE HCL 25 MG/ML IJ SOLN
INTRAMUSCULAR | Status: AC
  Filled 2021-12-05: qty 1

## 2021-12-05 MED ORDER — INSULIN ASPART 100 UNIT/ML IJ SOLN
0.0000 [IU] | INTRAMUSCULAR | Status: DC
Start: 2021-12-06 — End: 2021-12-06
  Administered 2021-12-06: 08:00:00 3 [IU] via SUBCUTANEOUS
  Administered 2021-12-06 (×2): 4 [IU] via SUBCUTANEOUS
  Administered 2021-12-06: 05:00:00 3 [IU] via SUBCUTANEOUS

## 2021-12-05 MED ORDER — PHENOL 1.4 % MT LIQD: 1.0000 | OROMUCOSAL | Status: DC | PRN

## 2021-12-05 MED ORDER — MIDAZOLAM HCL 2 MG/2ML IJ SOLN
INTRAMUSCULAR | Status: AC
  Filled 2021-12-05: qty 2

## 2021-12-05 MED ORDER — LIDOCAINE-EPINEPHRINE 1 %-1:100000 IJ SOLN
INTRAMUSCULAR | Status: AC
  Filled 2021-12-05: qty 1

## 2021-12-05 MED ORDER — PHENYLEPHRINE HCL-NACL 20-0.9 MG/250ML-% IV SOLN
INTRAVENOUS | Status: DC | PRN
  Administered 2021-12-05: 50 ug/min via INTRAVENOUS

## 2021-12-05 MED ORDER — VANCOMYCIN HCL 1500 MG/300ML IV SOLN
1500.0000 mg | INTRAVENOUS | Status: AC
  Administered 2021-12-05: 18:00:00 1500 mg via INTRAVENOUS
  Filled 2021-12-05: qty 300

## 2021-12-05 MED ORDER — ROCURONIUM BROMIDE 10 MG/ML (PF) SYRINGE
PREFILLED_SYRINGE | INTRAVENOUS | Status: AC
  Filled 2021-12-05: qty 20

## 2021-12-05 MED ORDER — DEXAMETHASONE SODIUM PHOSPHATE 10 MG/ML IJ SOLN
INTRAMUSCULAR | Status: AC
  Filled 2021-12-05: qty 1

## 2021-12-05 MED ORDER — HYDROMORPHONE HCL 1 MG/ML IJ SOLN
0.2500 mg | INTRAMUSCULAR | Status: DC | PRN
  Administered 2021-12-05 (×3): 0.5 mg via INTRAVENOUS

## 2021-12-05 MED ORDER — ORAL CARE MOUTH RINSE: 15.0000 mL | Freq: Once | OROMUCOSAL | Status: AC

## 2021-12-05 MED ORDER — SODIUM CHLORIDE 0.9% FLUSH
3.0000 mL | Freq: Two times a day (BID) | INTRAVENOUS | Status: DC
  Administered 2021-12-06: 11:00:00 3 mL via INTRAVENOUS

## 2021-12-05 MED ORDER — THROMBIN 5000 UNITS EX SOLR
OROMUCOSAL | Status: DC | PRN
  Administered 2021-12-05: 19:00:00 5 mL via TOPICAL

## 2021-12-05 MED ORDER — FENTANYL CITRATE (PF) 250 MCG/5ML IJ SOLN
INTRAMUSCULAR | Status: AC
  Filled 2021-12-05: qty 5

## 2021-12-05 MED ORDER — PHENYLEPHRINE 40 MCG/ML (10ML) SYRINGE FOR IV PUSH (FOR BLOOD PRESSURE SUPPORT)
PREFILLED_SYRINGE | INTRAVENOUS | Status: DC | PRN
  Administered 2021-12-05 (×2): 80 ug via INTRAVENOUS

## 2021-12-05 MED ORDER — OXYCODONE HCL 5 MG PO TABS: 5.0000 mg | ORAL_TABLET | ORAL | Status: DC | PRN

## 2021-12-05 MED ORDER — SODIUM CHLORIDE 0.9% FLUSH: 3.0000 mL | INTRAVENOUS | Status: DC | PRN

## 2021-12-05 MED ORDER — HYDROMORPHONE HCL 1 MG/ML IJ SOLN
1.0000 mg | INTRAMUSCULAR | Status: DC | PRN
  Administered 2021-12-06: 11:00:00 1 mg via INTRAVENOUS
  Filled 2021-12-05: qty 1

## 2021-12-05 MED ORDER — POLYETHYLENE GLYCOL 3350 17 G PO PACK: 17.0000 g | PACK | Freq: Every day | ORAL | Status: DC | PRN

## 2021-12-05 MED ORDER — GABAPENTIN 600 MG PO TABS
600.0000 mg | ORAL_TABLET | Freq: Three times a day (TID) | ORAL | Status: DC
  Administered 2021-12-06: 08:00:00 600 mg via ORAL
  Filled 2021-12-05: qty 1

## 2021-12-05 MED ORDER — POTASSIUM CHLORIDE CRYS ER 10 MEQ PO TBCR
15.0000 meq | EXTENDED_RELEASE_TABLET | Freq: Two times a day (BID) | ORAL | Status: DC
  Administered 2021-12-06: 08:00:00 15 meq via ORAL
  Filled 2021-12-05 (×3): qty 2

## 2021-12-05 MED ORDER — MIDAZOLAM HCL 5 MG/5ML IJ SOLN
INTRAMUSCULAR | Status: DC | PRN
  Administered 2021-12-05: 2 mg via INTRAVENOUS

## 2021-12-05 MED ORDER — ACETAMINOPHEN 325 MG PO TABS: 650.0000 mg | ORAL_TABLET | ORAL | Status: DC | PRN

## 2021-12-05 MED ORDER — ONDANSETRON HCL 4 MG/2ML IJ SOLN: 4.0000 mg | Freq: Four times a day (QID) | INTRAMUSCULAR | Status: DC | PRN

## 2021-12-05 MED ORDER — FENTANYL CITRATE (PF) 250 MCG/5ML IJ SOLN
INTRAMUSCULAR | Status: DC | PRN
  Administered 2021-12-05 (×2): 100 ug via INTRAVENOUS
  Administered 2021-12-05: 50 ug via INTRAVENOUS

## 2021-12-05 MED ORDER — 0.9 % SODIUM CHLORIDE (POUR BTL) OPTIME
TOPICAL | Status: DC | PRN
  Administered 2021-12-05: 19:00:00 1000 mL

## 2021-12-05 MED ORDER — DEXAMETHASONE SODIUM PHOSPHATE 10 MG/ML IJ SOLN
INTRAMUSCULAR | Status: DC | PRN
  Administered 2021-12-05: 4 mg via INTRAVENOUS

## 2021-12-05 MED ORDER — ALBUTEROL SULFATE (2.5 MG/3ML) 0.083% IN NEBU: 2.5000 mg | INHALATION_SOLUTION | Freq: Four times a day (QID) | RESPIRATORY_TRACT | Status: DC | PRN

## 2021-12-05 MED ORDER — PROPOFOL 10 MG/ML IV BOLUS
INTRAVENOUS | Status: DC | PRN
  Administered 2021-12-05: 140 mg via INTRAVENOUS

## 2021-12-05 MED ORDER — OXYCODONE HCL 5 MG PO TABS
10.0000 mg | ORAL_TABLET | ORAL | Status: DC | PRN
  Administered 2021-12-06 (×3): 10 mg via ORAL
  Filled 2021-12-05 (×3): qty 2

## 2021-12-05 MED ORDER — THROMBIN 5000 UNITS EX SOLR
CUTANEOUS | Status: AC
  Filled 2021-12-05: qty 5000

## 2021-12-05 MED ORDER — SIMVASTATIN 20 MG PO TABS
40.0000 mg | ORAL_TABLET | Freq: Every day | ORAL | Status: DC
  Administered 2021-12-05: 40 mg via ORAL
  Filled 2021-12-05: qty 2

## 2021-12-05 MED ORDER — PROPOFOL 10 MG/ML IV BOLUS
INTRAVENOUS | Status: AC
  Filled 2021-12-05: qty 20

## 2021-12-05 MED ORDER — ALBUMIN HUMAN 5 % IV SOLN: INTRAVENOUS | Status: DC | PRN

## 2021-12-05 MED ORDER — VANCOMYCIN HCL IN DEXTROSE 1-5 GM/200ML-% IV SOLN
INTRAVENOUS | Status: AC
  Filled 2021-12-05: qty 200

## 2021-12-05 MED ORDER — LIDOCAINE 2% (20 MG/ML) 5 ML SYRINGE
INTRAMUSCULAR | Status: AC
  Filled 2021-12-05: qty 5

## 2021-12-05 MED ORDER — ONDANSETRON HCL 4 MG/2ML IJ SOLN
INTRAMUSCULAR | Status: DC | PRN
  Administered 2021-12-05: 4 mg via INTRAVENOUS

## 2021-12-05 MED ORDER — ROCURONIUM BROMIDE 10 MG/ML (PF) SYRINGE
PREFILLED_SYRINGE | INTRAVENOUS | Status: DC | PRN
  Administered 2021-12-05 (×2): 20 mg via INTRAVENOUS
  Administered 2021-12-05: 60 mg via INTRAVENOUS

## 2021-12-05 MED ORDER — DEXTROSE 50 % IV SOLN: 0.0000 mL | INTRAVENOUS | Status: DC | PRN

## 2021-12-05 MED ORDER — LACTATED RINGERS IV SOLN: INTRAVENOUS | Status: DC

## 2021-12-05 MED ORDER — LIDOCAINE 2% (20 MG/ML) 5 ML SYRINGE
INTRAMUSCULAR | Status: DC | PRN
  Administered 2021-12-05: 60 mg via INTRAVENOUS

## 2021-12-05 MED ORDER — ACETAMINOPHEN 500 MG PO TABS
1000.0000 mg | ORAL_TABLET | Freq: Once | ORAL | Status: AC
  Administered 2021-12-05: 14:00:00 1000 mg via ORAL
  Filled 2021-12-05: qty 2

## 2021-12-05 MED ORDER — VANCOMYCIN HCL 1000 MG IV SOLR: 1000.0000 mg | Freq: Once | INTRAVENOUS | Status: DC

## 2021-12-05 MED ORDER — SODIUM CHLORIDE 0.9 % IV SOLN: 250.0000 mL | INTRAVENOUS | Status: DC

## 2021-12-05 SURGICAL SUPPLY — 74 items
ADH SKN CLS APL DERMABOND .7 (GAUZE/BANDAGES/DRESSINGS) ×1
APL SKNCLS STERI-STRIP NONHPOA (GAUZE/BANDAGES/DRESSINGS)
BAG COUNTER SPONGE SURGICOUNT (BAG) ×4 IMPLANT
BAG SPNG CNTER NS LX DISP (BAG) ×2
BAG SURGICOUNT SPONGE COUNTING (BAG) ×2
BAND INSRT 18 STRL LF DISP RB (MISCELLANEOUS)
BAND RUBBER #18 3X1/16 STRL (MISCELLANEOUS) ×4 IMPLANT
BASKET BONE COLLECTION (BASKET) ×4 IMPLANT
BENZOIN TINCTURE PRP APPL 2/3 (GAUZE/BANDAGES/DRESSINGS) IMPLANT
BLADE BN FN 3.2XSTRL LF (MISCELLANEOUS) IMPLANT
BLADE BONE MILL FINE (MISCELLANEOUS) ×3
BLADE CLIPPER SURG (BLADE) IMPLANT
BLADE SURG 11 STRL SS (BLADE) ×4 IMPLANT
BUR MATCHSTICK NEURO 3.0 LAGG (BURR) ×4 IMPLANT
BUR PRECISION FLUTE 5.0 (BURR) ×4 IMPLANT
CAGE EXP CATALYFT SHORT 9X22.5 (Cage) ×2 IMPLANT
CANISTER SUCT 3000ML PPV (MISCELLANEOUS) ×4 IMPLANT
CLOSURE WOUND 1/2 X4 (GAUZE/BANDAGES/DRESSINGS)
CNTNR URN SCR LID CUP LEK RST (MISCELLANEOUS) ×2 IMPLANT
CONT SPEC 4OZ STRL OR WHT (MISCELLANEOUS) ×3
COVER BACK TABLE 60X90IN (DRAPES) ×4 IMPLANT
DECANTER SPIKE VIAL GLASS SM (MISCELLANEOUS) ×4 IMPLANT
DERMABOND ADVANCED (GAUZE/BANDAGES/DRESSINGS) ×2
DERMABOND ADVANCED .7 DNX12 (GAUZE/BANDAGES/DRESSINGS) ×2 IMPLANT
DRAPE C-ARM 42X72 X-RAY (DRAPES) ×2 IMPLANT
DRAPE C-ARMOR (DRAPES) ×2 IMPLANT
DRAPE LAPAROTOMY 100X72X124 (DRAPES) ×4 IMPLANT
DRAPE MICROSCOPE LEICA (MISCELLANEOUS) ×2 IMPLANT
DRAPE SURG 17X23 STRL (DRAPES) ×4 IMPLANT
DURAPREP 26ML APPLICATOR (WOUND CARE) ×4 IMPLANT
ELECT REM PT RETURN 9FT ADLT (ELECTROSURGICAL) ×3
ELECTRODE REM PT RTRN 9FT ADLT (ELECTROSURGICAL) ×2 IMPLANT
GAUZE 4X4 16PLY ~~LOC~~+RFID DBL (SPONGE) ×2 IMPLANT
GAUZE SPONGE 4X4 12PLY STRL (GAUZE/BANDAGES/DRESSINGS) IMPLANT
GLOVE EXAM NITRILE LRG STRL (GLOVE) IMPLANT
GLOVE EXAM NITRILE XL STR (GLOVE) IMPLANT
GLOVE EXAM NITRILE XS STR PU (GLOVE) IMPLANT
GLOVE SURG ENC MOIS LTX SZ7 (GLOVE) ×2 IMPLANT
GLOVE SURG LTX SZ7.5 (GLOVE) ×8 IMPLANT
GLOVE SURG UNDER POLY LF SZ7.5 (GLOVE) ×10 IMPLANT
GOWN STRL REUS W/ TWL LRG LVL3 (GOWN DISPOSABLE) ×8 IMPLANT
GOWN STRL REUS W/ TWL XL LVL3 (GOWN DISPOSABLE) IMPLANT
GOWN STRL REUS W/TWL 2XL LVL3 (GOWN DISPOSABLE) IMPLANT
GOWN STRL REUS W/TWL LRG LVL3 (GOWN DISPOSABLE) ×15
GOWN STRL REUS W/TWL XL LVL3 (GOWN DISPOSABLE)
HEMOSTAT POWDER KIT SURGIFOAM (HEMOSTASIS) ×4 IMPLANT
KIT BASIN OR (CUSTOM PROCEDURE TRAY) ×4 IMPLANT
KIT POSITION SURG JACKSON T1 (MISCELLANEOUS) ×4 IMPLANT
KIT TURNOVER KIT B (KITS) ×4 IMPLANT
MILL MEDIUM DISP (BLADE) IMPLANT
NDL HYPO 18GX1.5 BLUNT FILL (NEEDLE) IMPLANT
NDL SPNL 18GX3.5 QUINCKE PK (NEEDLE) IMPLANT
NEEDLE HYPO 18GX1.5 BLUNT FILL (NEEDLE) IMPLANT
NEEDLE HYPO 22GX1.5 SAFETY (NEEDLE) ×4 IMPLANT
NEEDLE SPNL 18GX3.5 QUINCKE PK (NEEDLE) IMPLANT
NS IRRIG 1000ML POUR BTL (IV SOLUTION) ×4 IMPLANT
PACK LAMINECTOMY NEURO (CUSTOM PROCEDURE TRAY) ×4 IMPLANT
PAD ARMBOARD 7.5X6 YLW CONV (MISCELLANEOUS) ×12 IMPLANT
ROD PREBENT 30MM LUMBAR (Rod) ×4 IMPLANT
SCREW SET SOLERA (Screw) ×12 IMPLANT
SCREW SET SOLERA TI5.5 (Screw) IMPLANT
SCREW SOLERA 6.5X35MM (Screw) ×8 IMPLANT
SPONGE SURGIFOAM ABS GEL 100 (HEMOSTASIS) IMPLANT
SPONGE T-LAP 4X18 ~~LOC~~+RFID (SPONGE) ×2 IMPLANT
STRIP CLOSURE SKIN 1/2X4 (GAUZE/BANDAGES/DRESSINGS) IMPLANT
SUT MNCRL AB 3-0 PS2 18 (SUTURE) ×4 IMPLANT
SUT VIC AB 0 CT1 18XCR BRD8 (SUTURE) ×2 IMPLANT
SUT VIC AB 0 CT1 8-18 (SUTURE) ×3
SUT VIC AB 2-0 CP2 18 (SUTURE) ×6 IMPLANT
SYR 3ML LL SCALE MARK (SYRINGE) IMPLANT
TOWEL GREEN STERILE (TOWEL DISPOSABLE) ×4 IMPLANT
TOWEL GREEN STERILE FF (TOWEL DISPOSABLE) ×4 IMPLANT
TRAY FOLEY MTR SLVR 16FR STAT (SET/KITS/TRAYS/PACK) ×4 IMPLANT
WATER STERILE IRR 1000ML POUR (IV SOLUTION) ×4 IMPLANT

## 2021-12-05 NOTE — Anesthesia Preprocedure Evaluation (Addendum)
Anesthesia Evaluation  Patient identified by MRN, date of birth, ID band Patient awake    Reviewed: Allergy & Precautions, NPO status , Patient's Chart, lab work & pertinent test results  Airway Mallampati: III  TM Distance: >3 FB Neck ROM: Full    Dental  (+) Dental Advisory Given, Teeth Intact   Pulmonary shortness of breath, sleep apnea ,    Pulmonary exam normal breath sounds clear to auscultation       Cardiovascular hypertension, Normal cardiovascular exam Rhythm:Regular Rate:Normal     Neuro/Psych  Headaches, PSYCHIATRIC DISORDERS Depression  Neuromuscular disease    GI/Hepatic Neg liver ROS, GERD  ,  Endo/Other  diabetes, Type obesity  Renal/GU Renal disease     Musculoskeletal  (+) Arthritis ,   Abdominal (+) + obese,   Peds  Hematology negative hematology ROS (+)   Anesthesia Other Findings   Reproductive/Obstetrics                                                           Anesthesia Evaluation  Patient identified by MRN, date of birth, ID band Patient awake    Reviewed: Allergy & Precautions, H&P , NPO status , Patient's Chart, lab work & pertinent test results, reviewed documented beta blocker date and time   Airway Mallampati: II  TM Distance: >3 FB Neck ROM: full    Dental no notable dental hx.    Pulmonary shortness of breath, sleep apnea ,    Pulmonary exam normal breath sounds clear to auscultation       Cardiovascular Exercise Tolerance: Good hypertension, negative cardio ROS   Rhythm:regular Rate:Normal     Neuro/Psych  Headaches, PSYCHIATRIC DISORDERS Depression    GI/Hepatic Neg liver ROS, GERD  Medicated,  Endo/Other  diabetesMorbid obesity  Renal/GU Renal disease  negative genitourinary   Musculoskeletal   Abdominal   Peds  Hematology negative hematology ROS (+)   Anesthesia Other Findings    Reproductive/Obstetrics negative OB ROS                             Anesthesia Physical  Anesthesia Plan  ASA: III  Anesthesia Plan: General   Post-op Pain Management:    Induction:   PONV Risk Score and Plan: Ondansetron  Airway Management Planned:   Additional Equipment:   Intra-op Plan:   Post-operative Plan:   Informed Consent: I have reviewed the patients History and Physical, chart, labs and discussed the procedure including the risks, benefits and alternatives for the proposed anesthesia with the patient or authorized representative who has indicated his/her understanding and acceptance.     Dental Advisory Given  Plan Discussed with: CRNA  Anesthesia Plan Comments:         Anesthesia Quick Evaluation  Anesthesia Physical Anesthesia Plan  ASA: 3  Anesthesia Plan: General   Post-op Pain Management: Minimal or no pain anticipated, Tylenol PO (pre-op) and Dilaudid IV   Induction: Intravenous  PONV Risk Score and Plan: 4 or greater and Treatment may vary due to age or medical condition, Ondansetron, Dexamethasone, Midazolam and Diphenhydramine  Airway Management Planned: Oral ETT  Additional Equipment: None  Intra-op Plan:   Post-operative Plan: Extubation in OR  Informed Consent: I have reviewed the patients History and Physical,  chart, labs and discussed the procedure including the risks, benefits and alternatives for the proposed anesthesia with the patient or authorized representative who has indicated his/her understanding and acceptance.     Dental advisory given  Plan Discussed with: CRNA  Anesthesia Plan Comments: (2 x PIV  PAT note written by Shonna Chock, PA-C. Patient was able to reschedule preoperative cardiology visit with Nicki Guadalajara, MD for 12/01/21. He wrote, "...I feel she is stable from a cardiovascular standpoint to undergo the surgery and have given clearance for this procedure to proceed as  scheduled...")     Anesthesia Quick Evaluation

## 2021-12-05 NOTE — Op Note (Signed)
PATIENT: [name]  DAY OF SURGERY: 12/05/21   PRE-OPERATIVE DIAGNOSIS:  Isthmic spondylolisthesis, lumbar radiculopathy   POST-OPERATIVE DIAGNOSIS:  Same   PROCEDURE:  L5-S1 open TLIF and posterolateral instrumented fusion   SURGEON:  Surgeon(s) and Role:    Jadene Pierini, MD - Primary   ANESTHESIA: ETGA   BRIEF HISTORY: This is a 55 year old woman who presented with progressive back and bilateral lower extremity symptoms. Her symptoms were not responsive to non-surgical management. We discussed at length, given her increased risk of perioperative complications due to DM2, BMI of 45, etc regarding treatment options. We discussed risks, benefits, and alternatives and she wished to proceed with surgery.   OPERATIVE DETAIL: The patient was taken to the operating room and anesthesia was induced by the anesthesia team. They were placed on the OR table in the prone position with padding of all pressure points. A formal time out was performed with two patient identifiers and confirmed the operative site. The operative site was marked, hair was clipped with surgical clippers, the area was then prepped and draped in a sterile fashion. Fluoro was used to localize the operative level and a midline incision was placed to expose from L5 to S1. Subperiosteal dissection was performed bilaterally and fluoroscopy was again used to confirm the surgical level.   Instrumentation was then performed. A left L5-S1 facetectomy was performed to decompress the left L5 nerve root along its course. The ligamentum flavum was removed further and the traversing root was identified. Both were protected and the disc space was incised and a discectomy was performed with a combination of shavers, rongeurs, and curettes with fluoroscopy was needed. The endplates were prepped, the previously resected bone was morselized and inserted into the disc space as autograft as well as packed into an expandable Titanium interbody  device (Medtronic) which was inserted and expanded under fluoroscopic guidance. More bone was packed through the center of the cage and attention was then turned to the PLF/pedicle screw insertion.   Fluoroscopy was used to guide placement of bilateral pedicle screws (Medtronic) at L5 and S1. These were placed by localizing the pedicle with standard landmarks, drilling a pilot hole, cannulating the pedicle with an awl, palpating for pedicle wall breaches, tapping, palpating, and then placing the screw. These were connected with rods bilaterally and final tightened according to manufacturer torque specifications. The bone was thoroughly decorticated over the fusion surfaces along the posterolateral posterior elements of L5-S1. The previously resected bone fragments were morselized and used as autograft.   All instrument and sponge counts were correct, the incision was then closed in layers. The patient was then returned to anesthesia for emergence. No apparent complications at the completion of the procedure.   EBL:    DRAINS: none   SPECIMENS: none   Jadene Pierini, MD 12/05/21 4:44 PM

## 2021-12-05 NOTE — Progress Notes (Signed)
Still no results from the diabetes autoimmune panel.

## 2021-12-05 NOTE — H&P (Signed)
Surgical H&P Update  HPI: 55 y.o. woman with low back and bilateral lower extremity pain, workup showed an L5-S1 isthmic spondylolisthesis, symptoms did not improve (and worsened) with non-surgical treatment, now here for surgery. No changes in health since she was last seen. Still having pain and wishes to proceed with surgery.  PMHx:  Past Medical History:  Diagnosis Date   Diabetes mellitus    insulin pump 2013   Dysphagia 03/09/2020   Hypercholesteremia    Hypertension    Incarcerated umbilical hernia 03/25/2014   Kidney stone    Kidney stones 08/31/2008   Qualifier: Diagnosis of  By: Erby Pian MD, Cornelius     Neuropathy    Obesity    Obstructive sleep apnea    Palpitations    Shortness of breath    FamHx:  Family History  Problem Relation Age of Onset   Cancer Mother    Diabetes Mother    Cancer Paternal Uncle    Depression Paternal Grandmother    Depression Paternal Grandfather    Heart disease Other    Cancer Other    Diabetes Other    Achalasia Other    Anesthesia problems Neg Hx    Hypotension Neg Hx    Malignant hyperthermia Neg Hx    Pseudochol deficiency Neg Hx    SocHx:  reports that she has never smoked. She has never used smokeless tobacco. She reports that she does not drink alcohol and does not use drugs.  Physical Exam: AOx3, PERRL, FS, TM  Strength 5/5 x4 except left EHL 4/5 and L DF 4+/5, SILTx4 except bilateral stocking numbness  Assesment/Plan: 55 y.o. woman with low back pain with lumbar radiculopathy, here for L5-S1 open TLIF. Risks, benefits, and alternatives discussed and the patient would like to continue with surgery.  -OR today -3C post-op  Jadene Pierini, MD 12/05/21 4:36 PM

## 2021-12-05 NOTE — Transfer of Care (Signed)
Immediate Anesthesia Transfer of Care Note  Patient: [name]  Procedure(s) Performed: Open Lumbar five-Sacral one Laminectomy/Transforaminal Lumbar Interbody Fusion/Posterolateral instrumented fusion (Back)  Patient Location: PACU  Anesthesia Type:General  Level of Consciousness: sedated  Airway & Oxygen Therapy: Patient connected to nasal cannula oxygen  Post-op Assessment: Report given to RN and Post -op Vital signs reviewed and stable  Post vital signs: Reviewed and stable  Last Vitals:  Vitals Value Taken Time  BP 118/63 12/05/21 2143  Temp 96.9   Pulse 91 12/05/21 2145  Resp 13 12/05/21 2145  SpO2 99 % 12/05/21 2145  Vitals shown include unvalidated device data.  Last Pain:  Vitals:   12/05/21 1418  TempSrc:   PainSc: 8       Patients Stated Pain Goal: 4 (12/05/21 1418)  Complications: No notable events documented.

## 2021-12-05 NOTE — Progress Notes (Signed)
That may be an option that endocrinology will discuss with her.   I see that she is seeing cardiology, and she should follow their recommendations.

## 2021-12-05 NOTE — Anesthesia Procedure Notes (Signed)
Procedure Name: Intubation Date/Time: 12/05/2021 6:00 PM Performed by: Myna Bright, CRNA Pre-anesthesia Checklist: Patient identified, Emergency Drugs available, Suction available and Patient being monitored Patient Re-evaluated:Patient Re-evaluated prior to induction Oxygen Delivery Method: Circle system utilized Preoxygenation: Pre-oxygenation with 100% oxygen Induction Type: IV induction Ventilation: Mask ventilation without difficulty Laryngoscope Size: Mac and 3 Grade View: Grade II Tube type: Oral Tube size: 7.0 mm Number of attempts: 1 Airway Equipment and Method: Stylet Placement Confirmation: ETT inserted through vocal cords under direct vision, positive ETCO2 and breath sounds checked- equal and bilateral Secured at: 21 cm Tube secured with: Tape Dental Injury: Teeth and Oropharynx as per pre-operative assessment  Comments: Easy mask airway. DL with MAC 3, grade II view. Passed ETT, esophageal intubation noted. DL with MAC 3 a second time. ETT passed without difficulty. +ETCO2, +BBS, VSS.

## 2021-12-06 ENCOUNTER — Encounter (HOSPITAL_COMMUNITY): Payer: Self-pay | Admitting: Neurological Surgery

## 2021-12-06 DIAGNOSIS — M4317 Spondylolisthesis, lumbosacral region: Secondary | ICD-10-CM | POA: Diagnosis not present

## 2021-12-06 LAB — GLUCOSE, CAPILLARY
Glucose-Capillary: 127 mg/dL — ABNORMAL HIGH (ref 70–99)
Glucose-Capillary: 149 mg/dL — ABNORMAL HIGH (ref 70–99)
Glucose-Capillary: 188 mg/dL — ABNORMAL HIGH (ref 70–99)
Glucose-Capillary: 188 mg/dL — ABNORMAL HIGH (ref 70–99)

## 2021-12-06 MED ORDER — CYCLOBENZAPRINE HCL 10 MG PO TABS
10.0000 mg | ORAL_TABLET | Freq: Three times a day (TID) | ORAL | 0 refills | Status: DC | PRN
Start: 2021-12-06 — End: 2021-12-15

## 2021-12-06 MED ORDER — OXYCODONE HCL 5 MG PO TABS: 5.0000 mg | ORAL_TABLET | ORAL | 0 refills | Status: DC | PRN

## 2021-12-06 MED ORDER — VANCOMYCIN HCL IN DEXTROSE 1-5 GM/200ML-% IV SOLN
1000.0000 mg | Freq: Once | INTRAVENOUS | Status: AC
  Administered 2021-12-06: 06:00:00 1000 mg via INTRAVENOUS
  Filled 2021-12-06: qty 200

## 2021-12-06 NOTE — Evaluation (Signed)
Physical Therapy Evaluation Patient Details Name: [name] MRN: [id number] DOB: [birth year] Today's Date: 12/06/2021  History of Present Illness  55 y.o. female presents to [facility] hospital on 12/05/2021 with low back pain radiating into BLE. Workup revelas L5-S1 isthmic spondylolisthesis. Pt underwent L5-S1 TLIF. PMH includes DMII, HNT, OSA.  Clinical Impression  Pt presents to PT with deficits in endurance, power, strength, functional mobility, and gait. Pt demonstrates increased time for mobility tasks and reduced gait speed, largely limited by discomfort in low back. Pt will benefit from continued frequent mobilization to aide in improving comfort and independence with mobility. PT anticipates pt will progress well with mobility, may benefit from outpatient PT after follow-up with surgeon. Pt will benefit from receiving a RW to improve balance and reduce falls risk.     Recommendations for follow up therapy are one component of a multi-disciplinary discharge planning process, led by the attending physician.  Recommendations may be updated based on patient status, additional functional criteria and insurance authorization.  Follow Up Recommendations No PT follow up    Assistance Recommended at Discharge Intermittent Supervision/Assistance  Functional Status Assessment Patient has had a recent decline in their functional status and demonstrates the ability to make significant improvements in function in a reasonable and predictable amount of time.  Equipment Recommendations  Rolling walker (2 wheels)    Recommendations for Other Services       Precautions / Restrictions Precautions Precautions: Back Precaution Booklet Issued: Yes (comment) Required Braces or Orthoses:  (no brace needed per orders) Restrictions Weight Bearing Restrictions: No      Mobility  Bed Mobility Overal bed mobility: Needs Assistance Bed Mobility: Rolling;Sidelying to Sit;Sit to Sidelying Rolling:  Supervision Sidelying to sit: Supervision     Sit to sidelying: Supervision General bed mobility comments: verbal cues for technique and increased time, use of railing    Transfers Overall transfer level: Needs assistance Equipment used: Rolling walker (2 wheels) Transfers: Sit to/from Stand Sit to Stand: Min guard                Ambulation/Gait Ambulation/Gait assistance: Supervision Gait Distance (Feet): 150 Feet Assistive device: Rolling walker (2 wheels) Gait Pattern/deviations: Step-through pattern Gait velocity: reduced Gait velocity interpretation: <1.8 ft/sec, indicate of risk for recurrent falls   General Gait Details: pt with slowed step-through gait, reduced stride length  Stairs            Wheelchair Mobility    Modified Rankin (Stroke Patients Only)       Balance Overall balance assessment: Needs assistance Sitting-balance support: No upper extremity supported;Feet supported Sitting balance-Leahy Scale: Good     Standing balance support: Reliant on assistive device for balance Standing balance-Leahy Scale: Poor                               Pertinent Vitals/Pain Pain Assessment: 0-10 Pain Score: 7  Pain Location: back Pain Descriptors / Indicators: Sore Pain Intervention(s): Monitored during session    Home Living Family/patient expects to be discharged to:: Private residence Living Arrangements: Spouse/significant other Available Help at Discharge: Family;Available 24 hours/day Type of Home: House Home Access: Stairs to enter Entrance Stairs-Rails: None Entrance Stairs-Number of Steps: 1   Home Layout: Two level;Laundry or work area in [employer]: None      Prior Function Prior Level of Function : Independent/Modified Independent;Working/employed  Mobility Comments: works as a Psychologist, 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 [employer]        Extremity/Trunk Assessment   Upper  Extremity Assessment Upper Extremity Assessment: Overall WFL for tasks assessed    Lower Extremity Assessment Lower Extremity Assessment: Generalized weakness    Cervical / Trunk Assessment Cervical / Trunk Assessment: Back Surgery  Communication   Communication: No difficulties  Cognition Arousal/Alertness: Awake/alert Behavior During Therapy: WFL for tasks assessed/performed Overall Cognitive Status: Within Functional Limits for tasks assessed                                          General Comments General comments (skin integrity, edema, etc.): VSS on RA    Exercises     Assessment/Plan    PT Assessment Patient needs continued PT services  PT Problem List Decreased activity tolerance;Decreased balance;Decreased mobility;Decreased strength;Decreased knowledge of precautions;Pain       PT Treatment Interventions DME instruction;Gait training;Stair training;Functional mobility training;Therapeutic activities;Therapeutic exercise;Balance training;Neuromuscular re-education;Patient/family education    PT Goals (Current goals can be found in the Care Plan section)  Acute Rehab PT Goals Patient Stated Goal: to go home PT Goal Formulation: With patient Time For Goal Achievement: 12/11/21 Potential to Achieve Goals: Good    Frequency Min 5X/week   Barriers to discharge        Co-evaluation               AM-PAC PT "6 Clicks" Mobility  Outcome Measure Help needed turning from your back to your side while in a flat bed without using bedrails?: A Little Help needed moving from lying on your back to sitting on the side of a flat bed without using bedrails?: A Little Help needed moving to and from a bed to a chair (including a wheelchair)?: A Little Help needed standing up from a chair using your arms (e.g., wheelchair or bedside chair)?: A Little Help needed to walk in hospital room?: A Little Help needed climbing 3-5 steps with a railing? : A  Little 6 Click Score: 18    End of Session   Activity Tolerance: Patient tolerated treatment well Patient left: in bed;with call bell/phone within reach;with family/visitor present Nurse Communication: Mobility status PT Visit Diagnosis: Other abnormalities of gait and mobility (R26.89)    Time: 9983-3825 PT Time Calculation (min) (ACUTE ONLY): 23 min   Charges:   PT Evaluation $PT Eval Low Complexity: 1 Low          Arlyss Gandy, PT, DPT Acute Rehabilitation Pager: [phone] Office [phone]   Arlyss Gandy 12/06/2021, 10:50 AM

## 2021-12-06 NOTE — Progress Notes (Signed)
Neurosurgery Service Progress Note  Subjective: No acute events overnight, no new complaints except incisional pain, no radicular complaints, preop deep / instability pain improved  Objective: Vitals:   12/05/21 2300 12/05/21 2327 12/06/21 0424 12/06/21 0801  BP: 117/60 108/60 115/71 (!) 109/51  Pulse: 75 70 67 74  Resp:  18 18 18   Temp: 98.2 F (36.8 C) 98.3 F (36.8 C) 97.8 F (36.6 C) 97.9 F (36.6 C)  TempSrc:  Oral Oral Oral  SpO2: 98% 97% 99% 100%  Weight:      Height:        Physical Exam: Strength 5/5 x4, SILTx4, incision c/d/i  Assessment & Plan: 55 y.o. woman s/p open L5-S1 lami/TLIF/PLF, recovering well.  -discharge home today  53  12/06/21 8:03 AM

## 2021-12-06 NOTE — Progress Notes (Signed)
Pharmacy Antibiotic Note  [name] is a 55 y.o. female admitted on 12/05/2021 with surgical prophylaxis.  Pharmacy has been consulted for Vancomycin dosing. S/P spinal surgery. No drains noted. Noted vancomycin allergy on profile-tolerated pre-op dose.  Plan: Vancomycin 1000 mg IV x 1 at 0600  Height: 4\' 8"  (142.2 cm) Weight: 91.6 kg (202 lb) IBW/kg (Calculated) : 36.3  Temp (24hrs), Avg:98.1 F (36.7 C), Min:97.8 F (36.6 C), Max:98.3 F (36.8 C)  Recent Labs  Lab 11/30/21 1415  WBC 9.4  CREATININE 0.66    Estimated Creatinine Clearance: 73.3 mL/min (by C-G formula based on SCr of 0.66 mg/dL).    Allergies  Allergen Reactions   Ciprofloxacin Hives and Swelling   Penicillins Anaphylaxis and Rash   Codeine Other (See Comments)    had hallicunations   Morphine Nausea And Vomiting and Other (See Comments)    recieved in ED due to [facility] and had multple doses - gave visual hallucinations and vomitting.   Sulfonamide Derivatives Other (See Comments)    REACTION: Unsure - childhood allergy   Vancomycin Itching    HIMA [place], PharmD, BCPS Clinical Pharmacist Phone: [phone]

## 2021-12-06 NOTE — Evaluation (Signed)
Occupational Therapy Evaluation Patient Details Name: [name] MRN: [id number] DOB: 11-30-1966 Today's Date: 12/06/2021   History of Present Illness 55 y.o. female presents to [facility] hospital on 12/05/2021 with low back pain radiating into BLE. Workup revelas L5-S1 isthmic spondylolisthesis. Pt underwent L5-S1 TLIF. PMH includes DMII, HNT, OSA.   Clinical Impression   Pt admitted for procedure listed above. PTA pt reported independence with all ADL's and IADL's, including working as a Psychologist, sport and exercise. At this time, pt is limited by pain and mild weakness.  She is able to complete basic ADL's with increased time, following her precautions, using compensatory strategies. She has no further OT needs and acute OT will sign off.      Recommendations for follow up therapy are one component of a multi-disciplinary discharge planning process, led by the attending physician.  Recommendations may be updated based on patient status, additional functional criteria and insurance authorization.   Follow Up Recommendations  No OT follow up    Assistance Recommended at Discharge PRN  Functional Status Assessment  Patient has had a recent decline in their functional status and demonstrates the ability to make significant improvements in function in a reasonable and predictable amount of time.  Equipment Recommendations  Other (comment) (Youth RW)    Recommendations for Other Services       Precautions / Restrictions Precautions Precautions: Back Precaution Booklet Issued: Yes (comment) Precaution Comments: Reviewed all spinal precautions and compensatory strategies for basic ADL's. Required Braces or Orthoses:  (no brace needed per orders) Restrictions Weight Bearing Restrictions: No      Mobility Bed Mobility Overal bed mobility: Needs Assistance Bed Mobility: Rolling;Sidelying to Sit;Sit to Sidelying Rolling: Supervision Sidelying to sit: Supervision     Sit to sidelying:  Supervision General bed mobility comments: verbal cues for technique and increased time, use of railing    Transfers Overall transfer level: Needs assistance Equipment used: Rolling walker (2 wheels) Transfers: Sit to/from Stand Sit to Stand: Supervision                  Balance Overall balance assessment: Needs assistance Sitting-balance support: No upper extremity supported;Feet supported Sitting balance-Leahy Scale: Good     Standing balance support: Reliant on assistive device for balance Standing balance-Leahy Scale: Poor                             ADL either performed or assessed with clinical judgement   ADL Overall ADL's : Modified independent                                       General ADL Comments: Pt able to complete all basic ADL's with increased time and compensatory strategies. Overall limited by pain and anxiety. Husband can assist if pt needs physical assist.     Vision Baseline Vision/History: 0 No visual deficits Ability to See in Adequate Light: 0 Adequate Patient Visual Report: No change from baseline Vision Assessment?: No apparent visual deficits     Perception     Praxis      Pertinent Vitals/Pain Pain Assessment: 0-10 Pain Score: 8  Pain Location: back Pain Descriptors / Indicators: Sore Pain Intervention(s): Monitored during session;Patient requesting pain meds-RN notified     Hand Dominance Right   Extremity/Trunk Assessment Upper Extremity Assessment Upper Extremity Assessment: Overall WFL for tasks assessed  Lower Extremity Assessment Lower Extremity Assessment: Defer to PT evaluation   Cervical / Trunk Assessment Cervical / Trunk Assessment: Back Surgery   Communication Communication Communication: No difficulties   Cognition Arousal/Alertness: Awake/alert Behavior During Therapy: WFL for tasks assessed/performed Overall Cognitive Status: Within Functional Limits for tasks assessed                                        General Comments  VSS on RA, incision uncovered, dry, intact    Exercises     Shoulder Instructions      Home Living Family/patient expects to be discharged to:: Private residence Living Arrangements: Spouse/significant other Available Help at Discharge: Family;Available 24 hours/day Type of Home: House Home Access: Stairs to enter [employer] of Steps: 1 Entrance Stairs-Rails: None Home Layout: Two level;Laundry or work area in basement     [employer] Shower/Tub: Chief Strategy Officer: Handicapped height Bathroom Accessibility: Yes How Accessible: Accessible via walker Home Equipment: Shower seat          Prior Functioning/Environment Prior Level of Function : Independent/Modified Independent;Working/employed             Mobility Comments: works as a Psychologist, 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 [place] ADLs Comments: Indep        OT Problem List: Decreased strength;Decreased range of motion;Decreased activity tolerance;Impaired balance (sitting and/or standing);Decreased knowledge of use of DME or AE;Pain      OT Treatment/Interventions:      OT Goals(Current goals can be found in the care plan section) Acute Rehab OT Goals Patient Stated Goal: To reduce pain OT Goal Formulation: All assessment and education complete, DC therapy Time For Goal Achievement: 12/06/21 Potential to Achieve Goals: Good  OT Frequency:     Barriers to D/C:            Co-evaluation              AM-PAC OT "6 Clicks" Daily Activity     Outcome Measure Help from another person eating meals?: None Help from another person taking care of personal grooming?: None Help from another person toileting, which includes using toliet, bedpan, or urinal?: A Little Help from another person bathing (including washing, rinsing, drying)?: A Little Help from another person to put on and taking off regular upper body clothing?: None Help  from another person to put on and taking off regular lower body clothing?: A Little 6 Click Score: 21   End of Session Equipment Utilized During Treatment: Rolling walker (2 wheels) Nurse Communication: Mobility status  Activity Tolerance: Patient limited by pain Patient left: in bed;with call bell/phone within reach;with family/visitor present  OT Visit Diagnosis: Unsteadiness on feet (R26.81);Other abnormalities of gait and mobility (R26.89);Muscle weakness (generalized) (M62.81)                Time: 8032-1224 OT Time Calculation (min): 40 min Charges:  OT General Charges $OT Visit: 1 Visit OT Evaluation $OT Eval Moderate Complexity: 1 Mod OT Treatments $Self Care/Home Management : 23-37 mins  Vennie Waymire H., OTR/L Acute Rehabilitation  Latyra Jaye Elane Adilynne Fitzwater 12/06/2021, 11:41 AM

## 2021-12-06 NOTE — Progress Notes (Signed)
Discharge instructions/education/AVS/Rx given to patient with husband at bedside and they both verbalized understanding. MAE and ambulating well with walker. Patient voiding and emptying bladder well. No redness, no swelling, no drainage noted on incision site. Pain is mild to moderate and controlled by PRN meds. Patient awaiting for transport.

## 2021-12-06 NOTE — Anesthesia Postprocedure Evaluation (Signed)
Anesthesia Post Note  Patient: [name]  Procedure(s) Performed: Open Lumbar five-Sacral one Laminectomy/Transforaminal Lumbar Interbody Fusion/Posterolateral instrumented fusion (Back)     Patient location during evaluation: PACU Anesthesia Type: General Level of consciousness: awake and alert Pain management: pain level controlled Vital Signs Assessment: post-procedure vital signs reviewed and stable Respiratory status: spontaneous breathing, nonlabored ventilation, respiratory function stable and patient connected to nasal cannula oxygen Cardiovascular status: blood pressure returned to baseline and stable Postop Assessment: no apparent nausea or vomiting Anesthetic complications: no   No notable events documented.  Last Vitals:  Vitals:   12/05/21 2327 12/06/21 0424  BP: 108/60 115/71  Pulse: 70 67  Resp: 18 18  Temp: 36.8 C 36.6 C  SpO2: 97% 99%    Last Pain:  Vitals:   12/06/21 0522  TempSrc:   PainSc: Asleep                 Nelle Don [place]

## 2021-12-07 ENCOUNTER — Ambulatory Visit: Payer: No Typology Code available for payment source | Admitting: Cardiovascular Disease

## 2021-12-07 NOTE — Discharge Summary (Signed)
Discharge Summary  Date of Admission: 12/05/2021  Date of Discharge: 12/06/21  Attending Physician: Autumn Patty, MD  Hospital Course: Patient was admitted following an uncomplicated open L5-S1 laminectomy with TLIF/PLF. She was recovered in PACU and transferred to [facility]. Her hospital course was uncomplicated and the patient was discharged home on 12/06/21. She will follow up in clinic with me in 2 weeks.  Neurologic exam at discharge:  Strength 5/5 x4, SILTx4, no drift  Discharge diagnosis: Lumbar isthmic spondylolisthesis  Jadene Pierini, MD 12/07/21 7:44 AM

## 2021-12-08 NOTE — Progress Notes (Signed)
Her autoimmune labs haven't resulted yet. We stopped mounjaro over concerns for type 1 DM, and her C-peptide is low. Labs still to follow.

## 2021-12-11 ENCOUNTER — Other Ambulatory Visit (HOSPITAL_COMMUNITY): Payer: Self-pay

## 2021-12-11 MED ORDER — OXYCODONE-ACETAMINOPHEN 5-325 MG PO TABS
1.0000 | ORAL_TABLET | ORAL | 0 refills | Status: DC | PRN
  Filled 2021-12-11: qty 30, 5d supply, fill #0

## 2021-12-13 ENCOUNTER — Inpatient Hospital Stay (HOSPITAL_COMMUNITY)
Admission: EM | Admit: 2021-12-13 | Discharge: 2021-12-15 | DRG: 908 | Disposition: A | Discharge status: Home / Self Care | Payer: No Typology Code available for payment source | Attending: Neurological Surgery | Admitting: Neurological Surgery

## 2021-12-13 ENCOUNTER — Encounter (HOSPITAL_COMMUNITY): Payer: Self-pay | Admitting: Emergency Medicine

## 2021-12-13 DIAGNOSIS — Z6841 Body Mass Index (BMI) 40.0 and over, adult: Secondary | ICD-10-CM

## 2021-12-13 DIAGNOSIS — Z885 Allergy status to narcotic agent status: Secondary | ICD-10-CM

## 2021-12-13 DIAGNOSIS — Z818 Family history of other mental and behavioral disorders: Secondary | ICD-10-CM

## 2021-12-13 DIAGNOSIS — S064XAA Epidural hemorrhage with loss of consciousness status unknown, initial encounter: Secondary | ICD-10-CM | POA: Diagnosis present

## 2021-12-13 DIAGNOSIS — Z881 Allergy status to other antibiotic agents status: Secondary | ICD-10-CM

## 2021-12-13 DIAGNOSIS — M549 Dorsalgia, unspecified: Secondary | ICD-10-CM | POA: Diagnosis not present

## 2021-12-13 DIAGNOSIS — E7800 Pure hypercholesterolemia, unspecified: Secondary | ICD-10-CM | POA: Diagnosis present

## 2021-12-13 DIAGNOSIS — Z880 Allergy status to penicillin: Secondary | ICD-10-CM

## 2021-12-13 DIAGNOSIS — Z833 Family history of diabetes mellitus: Secondary | ICD-10-CM

## 2021-12-13 DIAGNOSIS — G8918 Other acute postprocedural pain: Secondary | ICD-10-CM | POA: Diagnosis present

## 2021-12-13 DIAGNOSIS — Z794 Long term (current) use of insulin: Secondary | ICD-10-CM

## 2021-12-13 DIAGNOSIS — G9761 Postprocedural hematoma of a nervous system organ or structure following a nervous system procedure: Principal | ICD-10-CM | POA: Diagnosis present

## 2021-12-13 DIAGNOSIS — Z9641 Presence of insulin pump (external) (internal): Secondary | ICD-10-CM | POA: Diagnosis present

## 2021-12-13 DIAGNOSIS — G4733 Obstructive sleep apnea (adult) (pediatric): Secondary | ICD-10-CM | POA: Diagnosis present

## 2021-12-13 DIAGNOSIS — E1140 Type 2 diabetes mellitus with diabetic neuropathy, unspecified: Secondary | ICD-10-CM | POA: Diagnosis present

## 2021-12-13 DIAGNOSIS — I10 Essential (primary) hypertension: Secondary | ICD-10-CM | POA: Diagnosis present

## 2021-12-13 DIAGNOSIS — Z7985 Long-term (current) use of injectable non-insulin antidiabetic drugs: Secondary | ICD-10-CM

## 2021-12-13 DIAGNOSIS — M5442 Lumbago with sciatica, left side: Secondary | ICD-10-CM

## 2021-12-13 DIAGNOSIS — Z882 Allergy status to sulfonamides status: Secondary | ICD-10-CM

## 2021-12-13 DIAGNOSIS — Z20822 Contact with and (suspected) exposure to covid-19: Secondary | ICD-10-CM | POA: Diagnosis present

## 2021-12-13 DIAGNOSIS — Z809 Family history of malignant neoplasm, unspecified: Secondary | ICD-10-CM

## 2021-12-13 LAB — BASIC METABOLIC PANEL
Anion gap: 9 (ref 5–15)
BUN: 14 mg/dL (ref 6–20)
CO2: 29 mmol/L (ref 22–32)
Calcium: 9.1 mg/dL (ref 8.9–10.3)
Chloride: 97 mmol/L — ABNORMAL LOW (ref 98–111)
Creatinine, Ser: 0.64 mg/dL (ref 0.44–1.00)
GFR, Estimated: >60 mL/min (ref >60)
Glucose, Bld: 101 mg/dL — ABNORMAL HIGH (ref 70–99)
Potassium: 3.9 mmol/L (ref 3.5–5.1)
Sodium: 135 mmol/L (ref 135–145)

## 2021-12-13 LAB — CBC WITH DIFFERENTIAL/PLATELET
Abs Immature Granulocytes: 0.1 10*3/uL — ABNORMAL HIGH (ref 0.00–0.07)
Basophils Absolute: 0.1 10*3/uL (ref 0.0–0.1)
Basophils Relative: 1 %
Eosinophils Absolute: 0.2 10*3/uL (ref 0.0–0.5)
Eosinophils Relative: 1 %
HCT: 33.4 % — ABNORMAL LOW (ref 36.0–46.0)
Hemoglobin: 10.8 g/dL — ABNORMAL LOW (ref 12.0–15.0)
Immature Granulocytes: 1 %
Lymphocytes Relative: 19 %
Lymphs Abs: 2.6 10*3/uL (ref 0.7–4.0)
MCH: 27.8 pg (ref 26.0–34.0)
MCHC: 32.3 g/dL (ref 30.0–36.0)
MCV: 86.1 fL (ref 80.0–100.0)
Monocytes Absolute: 0.8 10*3/uL (ref 0.1–1.0)
Monocytes Relative: 6 %
Neutro Abs: 9.8 10*3/uL — ABNORMAL HIGH (ref 1.7–7.7)
Neutrophils Relative %: 72 %
Platelets: 501 10*3/uL — ABNORMAL HIGH (ref 150–400)
RBC: 3.88 MIL/uL (ref 3.87–5.11)
RDW: 13.1 % (ref 11.5–15.5)
WBC: 13.4 10*3/uL — ABNORMAL HIGH (ref 4.0–10.5)
nRBC: 0 % (ref 0.0–0.2)

## 2021-12-13 LAB — CBG MONITORING, ED: Glucose-Capillary: 98 mg/dL (ref 70–99)

## 2021-12-13 NOTE — ED Triage Notes (Signed)
Patient sent to [facility] by spinal surgeon for admission, patient had rod placed in back on Tuesday by surgeon, patient states surgeon is concerned about a hematoma near the surgical site and plans to take her back into surgery tomorrow.

## 2021-12-13 NOTE — ED Provider Triage Note (Signed)
Emergency Medicine Provider Triage Evaluation Note  [name] , a 56 y.o. female  was evaluated in triage.  Pt complains of increasing back pain and left lower extremity weakness and pain.  Patient reports recent surgery on her back, 12/05/2021, L5-S1 open TLIF and posterolateral instrumented fusion.  Patient saw her neurosurgeon today.  They sent her to the emergency department for admission, MRI of the lumbar spine for concern of postoperative hematoma.  Patient denies fevers.  No urinary or bowel incontinence or retention.  Review of Systems  Positive: Back pain Negative: Fever  Physical Exam  BP (!) 109/56    Pulse 82    Temp 98.3 F (36.8 C)    Resp 20    LMP 07/16/2012    SpO2 100%  Gen:   Awake, no distress   Resp:  Normal effort  MSK:   Patient reports decreased sensation in both lower extremities to light touch distally, mild weakness with flexion and dorsiflexion of the left foot Other:    Medical Decision Making  Medically screening exam initiated at 6:58 PM.  Appropriate orders placed.  [name] was informed that the remainder of the evaluation will be completed by another provider, this initial triage assessment does not replace that evaluation, and the importance of remaining in the ED until their evaluation is complete.     Carlisle Cater, PA-C 12/13/21 [place], [place], DO 12/13/21 2134

## 2021-12-14 ENCOUNTER — Other Ambulatory Visit: Payer: Self-pay

## 2021-12-14 ENCOUNTER — Emergency Department (HOSPITAL_COMMUNITY): Payer: No Typology Code available for payment source | Admitting: Anesthesiology

## 2021-12-14 ENCOUNTER — Encounter (HOSPITAL_COMMUNITY): Payer: Self-pay

## 2021-12-14 ENCOUNTER — Encounter (HOSPITAL_COMMUNITY)
Admission: EM | Disposition: A | Discharge status: Home / Self Care | Payer: Self-pay | Source: Home / Self Care | Attending: Neurological Surgery

## 2021-12-14 ENCOUNTER — Emergency Department (HOSPITAL_COMMUNITY): Payer: No Typology Code available for payment source

## 2021-12-14 DIAGNOSIS — Z7985 Long-term (current) use of injectable non-insulin antidiabetic drugs: Secondary | ICD-10-CM | POA: Diagnosis not present

## 2021-12-14 DIAGNOSIS — Z794 Long term (current) use of insulin: Secondary | ICD-10-CM | POA: Diagnosis not present

## 2021-12-14 DIAGNOSIS — E1140 Type 2 diabetes mellitus with diabetic neuropathy, unspecified: Secondary | ICD-10-CM | POA: Diagnosis present

## 2021-12-14 DIAGNOSIS — Z881 Allergy status to other antibiotic agents status: Secondary | ICD-10-CM | POA: Diagnosis not present

## 2021-12-14 DIAGNOSIS — M549 Dorsalgia, unspecified: Secondary | ICD-10-CM | POA: Diagnosis present

## 2021-12-14 DIAGNOSIS — I10 Essential (primary) hypertension: Secondary | ICD-10-CM | POA: Diagnosis present

## 2021-12-14 DIAGNOSIS — Z880 Allergy status to penicillin: Secondary | ICD-10-CM | POA: Diagnosis not present

## 2021-12-14 DIAGNOSIS — G9761 Postprocedural hematoma of a nervous system organ or structure following a nervous system procedure: Secondary | ICD-10-CM | POA: Diagnosis present

## 2021-12-14 DIAGNOSIS — Z20822 Contact with and (suspected) exposure to covid-19: Secondary | ICD-10-CM | POA: Diagnosis present

## 2021-12-14 DIAGNOSIS — Z809 Family history of malignant neoplasm, unspecified: Secondary | ICD-10-CM | POA: Diagnosis not present

## 2021-12-14 DIAGNOSIS — Z882 Allergy status to sulfonamides status: Secondary | ICD-10-CM | POA: Diagnosis not present

## 2021-12-14 DIAGNOSIS — Z833 Family history of diabetes mellitus: Secondary | ICD-10-CM | POA: Diagnosis not present

## 2021-12-14 DIAGNOSIS — G8918 Other acute postprocedural pain: Secondary | ICD-10-CM | POA: Diagnosis present

## 2021-12-14 DIAGNOSIS — Z818 Family history of other mental and behavioral disorders: Secondary | ICD-10-CM | POA: Diagnosis not present

## 2021-12-14 DIAGNOSIS — S064XAA Epidural hemorrhage with loss of consciousness status unknown, initial encounter: Secondary | ICD-10-CM

## 2021-12-14 DIAGNOSIS — G4733 Obstructive sleep apnea (adult) (pediatric): Secondary | ICD-10-CM | POA: Diagnosis present

## 2021-12-14 DIAGNOSIS — Z6841 Body Mass Index (BMI) 40.0 and over, adult: Secondary | ICD-10-CM | POA: Diagnosis not present

## 2021-12-14 DIAGNOSIS — E7800 Pure hypercholesterolemia, unspecified: Secondary | ICD-10-CM | POA: Diagnosis present

## 2021-12-14 DIAGNOSIS — Z885 Allergy status to narcotic agent status: Secondary | ICD-10-CM | POA: Diagnosis not present

## 2021-12-14 DIAGNOSIS — Z9641 Presence of insulin pump (external) (internal): Secondary | ICD-10-CM | POA: Diagnosis present

## 2021-12-14 HISTORY — DX: Epidural hemorrhage with loss of consciousness status unknown, initial encounter: S06.4XAA

## 2021-12-14 HISTORY — PX: LUMBAR WOUND DEBRIDEMENT: SHX1988

## 2021-12-14 LAB — CBG MONITORING, ED
Glucose-Capillary: 77 mg/dL (ref 70–99)
Glucose-Capillary: 88 mg/dL (ref 70–99)

## 2021-12-14 LAB — RESP PANEL BY RT-PCR (FLU A&B, COVID) ARPGX2
Influenza A by PCR: NEGATIVE
Influenza B by PCR: NEGATIVE
SARS Coronavirus 2 by RT PCR: NEGATIVE

## 2021-12-14 LAB — GLUCOSE, CAPILLARY: Glucose-Capillary: 130 mg/dL — ABNORMAL HIGH (ref 70–99)

## 2021-12-14 SURGERY — LUMBAR WOUND DEBRIDEMENT
Anesthesia: General

## 2021-12-14 MED ORDER — ACETAMINOPHEN 10 MG/ML IV SOLN
1000.0000 mg | Freq: Once | INTRAVENOUS | Status: DC | PRN
  Administered 2021-12-14: 1000 mg via INTRAVENOUS

## 2021-12-14 MED ORDER — FENTANYL CITRATE (PF) 250 MCG/5ML IJ SOLN
INTRAMUSCULAR | Status: DC | PRN
  Administered 2021-12-14: 100 ug via INTRAVENOUS
  Administered 2021-12-14 (×3): 50 ug via INTRAVENOUS

## 2021-12-14 MED ORDER — CEFAZOLIN SODIUM-DEXTROSE 2-3 GM-%(50ML) IV SOLR
INTRAVENOUS | Status: DC | PRN
  Administered 2021-12-14: 2 g via INTRAVENOUS

## 2021-12-14 MED ORDER — FENTANYL CITRATE (PF) 100 MCG/2ML IJ SOLN
25.0000 ug | INTRAMUSCULAR | Status: DC | PRN
  Administered 2021-12-14 (×3): 50 ug via INTRAVENOUS

## 2021-12-14 MED ORDER — MIDAZOLAM HCL 2 MG/2ML IJ SOLN
INTRAMUSCULAR | Status: DC | PRN
  Administered 2021-12-14: 2 mg via INTRAVENOUS

## 2021-12-14 MED ORDER — LIDOCAINE 2% (20 MG/ML) 5 ML SYRINGE
INTRAMUSCULAR | Status: DC | PRN
  Administered 2021-12-14: 60 mg via INTRAVENOUS

## 2021-12-14 MED ORDER — ACETAMINOPHEN 500 MG PO TABS: 1000.0000 mg | ORAL_TABLET | Freq: Once | ORAL | Status: DC | PRN

## 2021-12-14 MED ORDER — OXYCODONE HCL 5 MG PO TABS
ORAL_TABLET | ORAL | Status: AC
  Filled 2021-12-14: qty 1

## 2021-12-14 MED ORDER — CHLORHEXIDINE GLUCONATE 0.12 % MT SOLN
15.0000 mL | Freq: Once | OROMUCOSAL | Status: AC
  Administered 2021-12-14: 15 mL via OROMUCOSAL

## 2021-12-14 MED ORDER — BACITRACIN ZINC 500 UNIT/GM EX OINT
TOPICAL_OINTMENT | CUTANEOUS | Status: AC
  Filled 2021-12-14: qty 28.35

## 2021-12-14 MED ORDER — SUGAMMADEX SODIUM 200 MG/2ML IV SOLN
INTRAVENOUS | Status: DC | PRN
Start: 2021-12-14 — End: 2021-12-14
  Administered 2021-12-14: 200 mg via INTRAVENOUS

## 2021-12-14 MED ORDER — OXYCODONE HCL 5 MG PO TABS
5.0000 mg | ORAL_TABLET | Freq: Once | ORAL | Status: AC
  Administered 2021-12-14: 5 mg via ORAL
  Filled 2021-12-14: qty 1

## 2021-12-14 MED ORDER — PROPOFOL 10 MG/ML IV BOLUS
INTRAVENOUS | Status: DC | PRN
  Administered 2021-12-14: 110 mg via INTRAVENOUS
  Administered 2021-12-14: 50 mg via INTRAVENOUS

## 2021-12-14 MED ORDER — MIDAZOLAM HCL 2 MG/2ML IJ SOLN
INTRAMUSCULAR | Status: AC
  Filled 2021-12-14: qty 2

## 2021-12-14 MED ORDER — OXYCODONE HCL 5 MG PO TABS
5.0000 mg | ORAL_TABLET | Freq: Once | ORAL | Status: AC | PRN
  Administered 2021-12-14: 5 mg via ORAL

## 2021-12-14 MED ORDER — THROMBIN 5000 UNITS EX SOLR
CUTANEOUS | Status: AC
  Filled 2021-12-14: qty 5000

## 2021-12-14 MED ORDER — LIDOCAINE-EPINEPHRINE 1 %-1:100000 IJ SOLN
INTRAMUSCULAR | Status: AC
  Filled 2021-12-14: qty 1

## 2021-12-14 MED ORDER — FENTANYL CITRATE (PF) 100 MCG/2ML IJ SOLN
INTRAMUSCULAR | Status: AC
  Filled 2021-12-14: qty 2

## 2021-12-14 MED ORDER — ACETAMINOPHEN 160 MG/5ML PO SOLN: 1000.0000 mg | Freq: Once | ORAL | Status: DC | PRN

## 2021-12-14 MED ORDER — THROMBIN 5000 UNITS EX SOLR
OROMUCOSAL | Status: DC | PRN
  Administered 2021-12-14: 5 mL via TOPICAL

## 2021-12-14 MED ORDER — FENTANYL CITRATE (PF) 100 MCG/2ML IJ SOLN
25.0000 ug | INTRAMUSCULAR | Status: DC | PRN
  Administered 2021-12-14 (×2): 50 ug via INTRAVENOUS

## 2021-12-14 MED ORDER — ACETAMINOPHEN 10 MG/ML IV SOLN
INTRAVENOUS | Status: AC
  Filled 2021-12-14: qty 100

## 2021-12-14 MED ORDER — FENTANYL CITRATE PF 50 MCG/ML IJ SOSY
50.0000 ug | PREFILLED_SYRINGE | Freq: Once | INTRAMUSCULAR | Status: AC
  Administered 2021-12-14: 50 ug via INTRAVENOUS
  Filled 2021-12-14: qty 1

## 2021-12-14 MED ORDER — LIDOCAINE-EPINEPHRINE 1 %-1:100000 IJ SOLN
INTRAMUSCULAR | Status: DC | PRN
Start: 2021-12-14 — End: 2021-12-14
  Administered 2021-12-14: 10 mL

## 2021-12-14 MED ORDER — ORAL CARE MOUTH RINSE: 15.0000 mL | Freq: Once | OROMUCOSAL | Status: AC

## 2021-12-14 MED ORDER — PHENYLEPHRINE 40 MCG/ML (10ML) SYRINGE FOR IV PUSH (FOR BLOOD PRESSURE SUPPORT)
PREFILLED_SYRINGE | INTRAVENOUS | Status: DC | PRN
Start: 2021-12-14 — End: 2021-12-14
  Administered 2021-12-14: 80 ug via INTRAVENOUS

## 2021-12-14 MED ORDER — ONDANSETRON HCL 4 MG/2ML IJ SOLN
INTRAMUSCULAR | Status: DC | PRN
  Administered 2021-12-14: 4 mg via INTRAVENOUS

## 2021-12-14 MED ORDER — 0.9 % SODIUM CHLORIDE (POUR BTL) OPTIME
TOPICAL | Status: DC | PRN
Start: 2021-12-14 — End: 2021-12-14
  Administered 2021-12-14: 1000 mL

## 2021-12-14 MED ORDER — ROCURONIUM BROMIDE 10 MG/ML (PF) SYRINGE
PREFILLED_SYRINGE | INTRAVENOUS | Status: DC | PRN
  Administered 2021-12-14: 60 mg via INTRAVENOUS
  Administered 2021-12-14: 10 mg via INTRAVENOUS

## 2021-12-14 MED ORDER — HYDROMORPHONE HCL 1 MG/ML IJ SOLN
INTRAMUSCULAR | Status: AC
  Filled 2021-12-14: qty 1

## 2021-12-14 MED ORDER — LORAZEPAM 1 MG PO TABS: 1.0000 mg | ORAL_TABLET | Freq: Once | ORAL | Status: DC

## 2021-12-14 MED ORDER — LORAZEPAM 1 MG PO TABS
0.5000 mg | ORAL_TABLET | Freq: Once | ORAL | Status: AC
  Administered 2021-12-14: 0.5 mg via ORAL
  Filled 2021-12-14: qty 1

## 2021-12-14 MED ORDER — FENTANYL CITRATE (PF) 250 MCG/5ML IJ SOLN
INTRAMUSCULAR | Status: AC
  Filled 2021-12-14: qty 5

## 2021-12-14 MED ORDER — OXYCODONE HCL 5 MG/5ML PO SOLN: 5.0000 mg | Freq: Once | ORAL | Status: AC | PRN

## 2021-12-14 MED ORDER — PROPOFOL 10 MG/ML IV BOLUS
INTRAVENOUS | Status: AC
  Filled 2021-12-14: qty 20

## 2021-12-14 MED ORDER — CEFAZOLIN SODIUM-DEXTROSE 2-4 GM/100ML-% IV SOLN
INTRAVENOUS | Status: AC
  Filled 2021-12-14: qty 100

## 2021-12-14 MED ORDER — LACTATED RINGERS IV SOLN: INTRAVENOUS | Status: DC

## 2021-12-14 MED ORDER — HYDROMORPHONE HCL 1 MG/ML IJ SOLN
1.0000 mg | INTRAMUSCULAR | Status: DC | PRN
  Administered 2021-12-14 – 2021-12-15 (×3): 1 mg via INTRAVENOUS
  Filled 2021-12-14: qty 1

## 2021-12-14 MED ORDER — GADOBUTROL 1 MMOL/ML IV SOLN
9.0000 mL | Freq: Once | INTRAVENOUS | Status: AC | PRN
  Administered 2021-12-14: 9 mL via INTRAVENOUS

## 2021-12-14 SURGICAL SUPPLY — 48 items
ADH SKN CLS APL DERMABOND .7 (GAUZE/BANDAGES/DRESSINGS) ×1
BAG COUNTER SPONGE SURGICOUNT (BAG) ×4 IMPLANT
BAG SPNG CNTER NS LX DISP (BAG) ×2
BAND INSRT 18 STRL LF DISP RB (MISCELLANEOUS)
BAND RUBBER #18 3X1/16 STRL (MISCELLANEOUS) IMPLANT
BLADE CLIPPER SURG (BLADE) IMPLANT
BLADE SURG 11 STRL SS (BLADE) ×3 IMPLANT
CANISTER SUCT 3000ML PPV (MISCELLANEOUS) ×3 IMPLANT
DECANTER SPIKE VIAL GLASS SM (MISCELLANEOUS) ×2 IMPLANT
DERMABOND ADVANCED (GAUZE/BANDAGES/DRESSINGS) ×1
DERMABOND ADVANCED .7 DNX12 (GAUZE/BANDAGES/DRESSINGS) IMPLANT
DRAPE C-ARM 42X72 X-RAY (DRAPES) IMPLANT
DRAPE LAPAROTOMY 100X72X124 (DRAPES) ×3 IMPLANT
DRAPE MICROSCOPE LEICA (MISCELLANEOUS) IMPLANT
DRAPE SURG 17X23 STRL (DRAPES) ×3 IMPLANT
DURAPREP 26ML APPLICATOR (WOUND CARE) ×3 IMPLANT
ELECT REM PT RETURN 9FT ADLT (ELECTROSURGICAL) ×2
ELECTRODE REM PT RTRN 9FT ADLT (ELECTROSURGICAL) ×2 IMPLANT
GAUZE 4X4 16PLY ~~LOC~~+RFID DBL (SPONGE) ×1 IMPLANT
GAUZE SPONGE 4X4 12PLY STRL (GAUZE/BANDAGES/DRESSINGS) IMPLANT
GLOVE EXAM NITRILE LRG STRL (GLOVE) IMPLANT
GLOVE EXAM NITRILE XL STR (GLOVE) IMPLANT
GLOVE EXAM NITRILE XS STR PU (GLOVE) IMPLANT
GLOVE SURG LTX SZ7.5 (GLOVE) ×3 IMPLANT
GLOVE SURG UNDER POLY LF SZ7.5 (GLOVE) ×3 IMPLANT
GOWN STRL REUS W/ TWL LRG LVL3 (GOWN DISPOSABLE) ×4 IMPLANT
GOWN STRL REUS W/ TWL XL LVL3 (GOWN DISPOSABLE) IMPLANT
GOWN STRL REUS W/TWL 2XL LVL3 (GOWN DISPOSABLE) IMPLANT
GOWN STRL REUS W/TWL LRG LVL3 (GOWN DISPOSABLE) ×4
GOWN STRL REUS W/TWL XL LVL3 (GOWN DISPOSABLE)
HEMOSTAT POWDER KIT SURGIFOAM (HEMOSTASIS) ×3 IMPLANT
KIT BASIN OR (CUSTOM PROCEDURE TRAY) ×3 IMPLANT
KIT TURNOVER KIT B (KITS) ×3 IMPLANT
NEEDLE HYPO 22GX1.5 SAFETY (NEEDLE) ×1 IMPLANT
NS IRRIG 1000ML POUR BTL (IV SOLUTION) ×3 IMPLANT
PACK LAMINECTOMY NEURO (CUSTOM PROCEDURE TRAY) ×3 IMPLANT
PAD ARMBOARD 7.5X6 YLW CONV (MISCELLANEOUS) ×9 IMPLANT
SPONGE T-LAP 4X18 ~~LOC~~+RFID (SPONGE) ×1 IMPLANT
SUT ETHILON 3 0 FSL (SUTURE) ×2 IMPLANT
SUT MNCRL AB 3-0 PS2 18 (SUTURE) ×3 IMPLANT
SUT VIC AB 0 CT1 18XCR BRD8 (SUTURE) ×2 IMPLANT
SUT VIC AB 0 CT1 8-18 (SUTURE) ×2
SUT VIC AB 2-0 CT2 18 VCP726D (SUTURE) ×4 IMPLANT
SWAB COLLECTION DEVICE MRSA (MISCELLANEOUS) IMPLANT
SWAB CULTURE ESWAB REG 1ML (MISCELLANEOUS) IMPLANT
TOWEL GREEN STERILE (TOWEL DISPOSABLE) ×3 IMPLANT
TOWEL GREEN STERILE FF (TOWEL DISPOSABLE) ×3 IMPLANT
WATER STERILE IRR 1000ML POUR (IV SOLUTION) ×3 IMPLANT

## 2021-12-14 NOTE — ED Notes (Signed)
Patient transported to MRI 

## 2021-12-14 NOTE — ED Provider Notes (Signed)
[facility] EMERGENCY DEPARTMENT Provider Note   CSN: [id number] Arrival date & time: 12/13/21  1406     History  Chief Complaint  Patient presents with   Back Pain    [name] is a 56 y.o. female.  Patient is a 56 year old female who presents with worsening back pain after spine surgery.  She had surgery on December 27 by Dr. Zada Finders.  This was an open L5/S1 laminectomy with TLIF/PLF.  Over the last couple of days she has had some worsening back pain with new radiation down her legs, primarily her left leg.  She feels like her left leg is slightly weaker than her right and she has some numbness in both of her feet.  These are new symptoms since the surgery.  She saw Dr. Zada Finders in the office yesterday and was advised to come to the emergency room for an MRI as he indicated to her that she may need a repeat surgery.  She denies any known fevers.  No loss of bowel or bladder control.      Home Medications Prior to Admission medications   Medication Sig Start Date End Date Taking? Authorizing Provider  albuterol (VENTOLIN HFA) 108 (90 Base) MCG/ACT inhaler Inhale 2 puffs into the lungs every 6 (six) hours as needed for wheezing or shortness of breath.  08/27/19  Yes [provider]  cyclobenzaprine (FLEXERIL) 10 MG tablet Take 1 tablet (10 mg total) by mouth 3 (three) times daily as needed for muscle spasms. 12/06/21  Yes Judith Part, MD  gabapentin (NEURONTIN) 600 MG tablet TAKE 1 TABLET BY MOUTH THREE TIMES DAILY 07/24/21  Yes   insulin lispro (HUMALOG) 100 UNIT/ML injection Inject 80 units under the skin via omnipod daily 11/24/21  Yes Fayrene Helper, MD  lisinopril-hydrochlorothiazide (ZESTORETIC) 20-12.5 MG tablet Take 1 tablet by mouth daily.   Yes [provider]  ondansetron (ZOFRAN) 4 MG tablet Take 1 tablet (4 mg total) by mouth every 8 (eight) hours as needed for nausea or vomiting. 09/21/21  Yes Fayrene Helper, MD   oxyCODONE (OXY IR/ROXICODONE) 5 MG immediate release tablet Take 1 tablet (5 mg total) by mouth every 4 (four) hours as needed (pain). 12/06/21  Yes Judith Part, MD  potassium chloride SA (KLOR-CON M15) 15 MEQ tablet Take 15 mEq by mouth 2 (two) times daily.   Yes [provider]  simvastatin (ZOCOR) 40 MG tablet Take 40 mg by mouth daily.  03/29/19  Yes [provider]  tirzepatide Darcel Bayley) 5 MG/0.5ML Pen Inject 5 mg into the skin once a week. Monday   Yes [provider]  blood glucose meter kit and supplies Use up to four times daily as directed. 08/31/21   [name], NP  glucose blood test strip Test up to 4 times a day 10/02/21   Lindell Spar, MD  Lancets (FREESTYLE) lancets Use to test 4 times daily 08/31/21   [name], NP  oxyCODONE-acetaminophen (PERCOCET/ROXICET) 5-325 MG tablet Take 1 tablet by mouth every 4 (four) hours as needed for pain Patient not taking: Reported on 12/14/2021 12/11/21         Allergies    Ciprofloxacin, Penicillins, Codeine, Morphine, Sulfonamide derivatives, and Vancomycin    Review of Systems   Review of Systems  Constitutional:  Negative for chills, diaphoresis, fatigue and fever.  HENT:  Negative for congestion, rhinorrhea and sneezing.   Eyes: Negative.   Respiratory:  Negative for cough,  chest tightness and shortness of breath.   Cardiovascular:  Negative for chest pain and leg swelling.  Gastrointestinal:  Negative for abdominal pain, blood in stool, diarrhea, nausea and vomiting.  Genitourinary:  Negative for difficulty urinating, flank pain, frequency and hematuria.  Musculoskeletal:  Positive for back pain. Negative for arthralgias.  Skin:  Negative for rash.  Neurological:  Positive for weakness and numbness. Negative for dizziness, speech difficulty and headaches.   Physical Exam Updated Vital Signs BP 121/68 (BP Location: Left Arm)    Pulse 80    Temp 97.8 F (36.6 C) (Oral)    Resp 20    Ht 4'  8" (1.422 m)    Wt 91.6 kg    LMP 07/16/2012    SpO2 99%    BMI 45.29 kg/m  Physical Exam Constitutional:      Appearance: She is well-developed.  HENT:     Head: Normocephalic and atraumatic.  Eyes:     Pupils: Pupils are equal, round, and reactive to light.  Cardiovascular:     Rate and Rhythm: Normal rate and regular rhythm.     Heart sounds: Normal heart sounds.  Pulmonary:     Effort: Pulmonary effort is normal. No respiratory distress.     Breath sounds: Normal breath sounds. No wheezing or rales.  Chest:     Chest wall: No tenderness.  Abdominal:     General: Bowel sounds are normal.     Palpations: Abdomen is soft.     Tenderness: There is no abdominal tenderness. There is no guarding or rebound.  Musculoskeletal:        General: Normal range of motion.     Cervical back: Normal range of motion and neck supple.  Lymphadenopathy:     Cervical: No cervical adenopathy.  Skin:    General: Skin is warm and dry.     Findings: No rash.     Comments: Healing incision with Dermabond in place to her lower back.  No obvious swelling.  No drainage or signs of infection.  Neurological:     Mental Status: She is alert and oriented to person, place, and time.     Comments: Some mild weakness in the left leg as compared to the right.  She has sensation to light touch in both of her lower extremities although she reports it is diminished from baseline.    ED Results / Procedures / Treatments   Labs (all labs ordered are listed, but only abnormal results are displayed) Labs Reviewed  CBC WITH DIFFERENTIAL/PLATELET - Abnormal; Notable for the following components:      Result Value   WBC 13.4 (*)    Hemoglobin 10.8 (*)    HCT 33.4 (*)    Platelets 501 (*)    Neutro Abs 9.8 (*)    Abs Immature Granulocytes 0.10 (*)    All other components within normal limits  BASIC METABOLIC PANEL - Abnormal; Notable for the following components:   Chloride 97 (*)    Glucose, Bld 101 (*)     All other components within normal limits  RESP PANEL BY RT-PCR (FLU A&B, COVID) ARPGX2  CBG MONITORING, ED  CBG MONITORING, ED    EKG None  Radiology MR Lumbar Spine W Wo Contrast  Result Date: 12/14/2021 CLINICAL DATA:  56 year old female postoperative day 9 status post L5-S1 decompression and fusion. Suspicion of postoperative bleeding. Planned surgical exploration. EXAM: MRI LUMBAR SPINE WITHOUT AND WITH CONTRAST TECHNIQUE: Multiplanar and multiecho pulse sequences of  the lumbar spine were obtained without and with intravenous contrast. CONTRAST:  49m GADAVIST GADOBUTROL 1 MMOL/ML IV SOLN COMPARISON:  Preoperative lumbar MRI 11/06/2021. FINDINGS: Segmentation: Lumbar segmentation appears to be normal which is the same numbering system used in November. Alignment: Stable lumbar lordosis. Mild chronic grade 1 anterolisthesis of L5 on S1. Vertebrae: New hardware susceptibility artifact at L5 and S1. Postoperative details are below. No superimposed No marrow edema or evidence of acute osseous abnormality. Intact visible sacrum. Conus medullaris and cauda equina: Conus extends to the T12 level. No lower spinal cord or conus signal abnormality. Above L5 the spinal canal is fairly capacious and cauda equina nerve roots appear normal. No abnormal intradural enhancement. No convincing dural thickening. Paraspinal and other soft tissues: Paraspinal postoperative findings are below. Stable visible abdominal viscera. Disc levels: Visible lower thoracic levels through L3-L4 remain negative. L4-L5: Chronic disc space loss. Mild disc osteophyte complex appears stable. No significant stenosis. L5-S1: Interval decompression and fusion. There is a thin posterior midline fluid tract (series 8, image 34) communicating with a much larger heterogeneous subcutaneous fluid collection which has some layering blood or debris (series 8, image 34 and series 6, image 9). But the subcutaneous collection is not significantly  organized or rim enhancing. Encompasses 68 x 24 x 113 mm (AP by transverse by CC) for an estimated volume of 92 mL. Postoperative changes to the regional erector spinae muscles which appear mildly edematous and enhancing. The L5-S1 thecal sac appears a faced in part due to indistinct right posterior epidural space edema or fluid as seen on series 8, image 35. Subsequently, there is severe spinal stenosis. Foraminal hardware artifact but no definite high-grade foraminal stenosis. IMPRESSION: 1. Interval decompression and fusion at L5-S1. Severe spinal stenosis there appears related to epidural space edema and/or fluid (series 8, image 34) and a thin midline fluid tract from the decompressive laminectomy communicates with a much larger subcutaneous fluid collection (92 mL). But there is no organized or rim enhancing fluid collection. Differential considerations include postoperative hematoma, seroma, CSF leak. 2. Other lumbar levels are stable since November. Chronic L4 disc degeneration. Electronically Signed   By: HGenevie AnnM.D.   On: 12/14/2021 06:59    Procedures Procedures    Medications Ordered in ED Medications  fentaNYL (SUBLIMAZE) injection 50 mcg (has no administration in time range)  oxyCODONE (Oxy IR/ROXICODONE) immediate release tablet 5 mg (5 mg Oral Given 12/14/21 0439)  LORazepam (ATIVAN) tablet 0.5 mg (0.5 mg Oral Given 12/14/21 0550)  gadobutrol (GADAVIST) 1 MMOL/ML injection 9 mL (9 mLs Intravenous Contrast Given 12/14/21 03329    ED Course/ Medical Decision Making/ A&P                           Medical Decision Making Amount and/or Complexity of Data Reviewed External Data Reviewed: labs and radiology. Labs: ordered. Radiology: ordered.  Risk Parenteral controlled substances. Decision regarding hospitalization. Emergency major surgery.  Critical Care Total time providing critical care: 30-74 minutes  Patient is a 57year old female who presents with worsening pain after  recent spinal surgery.  She has some new weakness and numbness in her left leg.  I did review her chart and her prior surgical notes.  She had an MRI in the ED which shows a new fluid collection in the epidural space.  These images were reviewed by me.  Her labs were reviewed.  She is afebrile.  Her WBC count is mildly elevated.  Patient is a diabetic which would make infection possibly more likely.  I discussed to Dr. Zada Finders who will admit the patient and take the patient to surgery for decompression.  Final Clinical Impression(s) / ED Diagnoses Final diagnoses:  Acute midline low back pain with left-sided sciatica  Epidural hematoma    Rx / DC Orders ED Discharge Orders     None         Malvin Johns, MD 12/14/21 1141

## 2021-12-14 NOTE — Op Note (Signed)
PATIENT: [name]  DAY OF SURGERY: 12/14/21   PRE-OPERATIVE DIAGNOSIS:  Post-operative epidural hematoma   POST-OPERATIVE DIAGNOSIS:  Same   PROCEDURE:  Evacuation of lumbar epidural hematoma   SURGEON:  Surgeon(s) and Role:    Judith Part, MD - Primary   ANESTHESIA: ETGA   BRIEF HISTORY: This is a 56 year old woman in whom I recently performed an open L5-S1 TLIF/PLF for an isthmic spondylolisthesis. She did great post-op for a few days with resolution of her symptoms then had new severe refractory back and bilateral lower extremity pain. MRI confirmed the presumed diagnosis of epidural hematoma with thecal sac compression. Given her degree of discomfort, I recommended evacuation of the hematoma and she wished to proceed.    OPERATIVE DETAIL: The patient was taken to the operating room, anesthesia was induced by the anesthesia team, and the patient was placed on the OR table in the prone position. A formal time out was performed with two patient identifiers and confirmed the operative site. The operative site was marked, hair was clipped with surgical clippers, the area was then prepped and draped in a sterile fashion. The patient's prior incision was opened. Dissection through the soft tissues was performed, the fascial sutures were cut and fascia was opened. Upon opening the dermal sutures, dark hematoma was present without any active bleeding. This was followed down to a communication through the fascia and there was an epidural hematoma compressing the thecal sac. This was evacuated and the thecal sac were confirmed to be well decompressed. There was no active bleeding noted, but multiple rounds of irrigation were used to look for any additional subtle oozing or bleeding. With hemostasis confirmed, all instrument and sponge counts were correct, and the incision was closed in layers. The patient was then returned to anesthesia for emergence. No apparent complications at the  completion of the procedure.   EBL:  82mL   DRAINS: none   SPECIMENS: none   Judith Part, MD 12/14/21 4:48 PM

## 2021-12-14 NOTE — H&P (Signed)
Surgical H&P Update  HPI: 56 y.o. woman in whom I performed an L5-S1 TLIF/PLF on 12/05/21. Post-op, she had complete resolution of her symptoms and was doing very well for a few days. She then had new onset severe low back and BLE pain that was refractory to medication. I saw her yesterday in clinic for a short interval follow up and was concerned about an epidural hematoma and sent her to the ED. No change in bowel/bladder control, very pain limited but no new focal weakness.   PMHx:  Past Medical History:  Diagnosis Date   Diabetes mellitus    insulin pump 2013   Dysphagia 03/09/2020   Hypercholesteremia    Hypertension    Incarcerated umbilical hernia 03/25/2014   Kidney stone    Kidney stones 08/31/2008   Qualifier: Diagnosis of  By: Erby Pian MD, Cornelius     Neuropathy    Obesity    Obstructive sleep apnea    Palpitations    Shortness of breath    FamHx:  Family History  Problem Relation Age of Onset   Cancer Mother    Diabetes Mother    Cancer Paternal Uncle    Depression Paternal Grandmother    Depression Paternal Grandfather    Heart disease Other    Cancer Other    Diabetes Other    Achalasia Other    Anesthesia problems Neg Hx    Hypotension Neg Hx    Malignant hyperthermia Neg Hx    Pseudochol deficiency Neg Hx    SocHx:  reports that she has never smoked. She has never used smokeless tobacco. She reports that she does not drink alcohol and does not use drugs.  Physical Exam: AOx3, PERRL, FS, TM  Strength 5/5 x4, SILTx4  Assesment/Plan: 56 y.o. woman s/p L5-S1 TLIF/PLF with delayed onset severe post-op radicular and back pain, MRI confirms a post-op epidural hematoma with canal stenosis.   -OR today for evacuation of hematoma, keep NPO  Jadene Pierini, MD 12/14/21 10:31 AM

## 2021-12-14 NOTE — Anesthesia Procedure Notes (Addendum)
Procedure Name: Intubation Date/Time: 12/14/2021 5:32 PM Performed by: Griffin Dakin, CRNA Pre-anesthesia Checklist: Patient identified, Emergency Drugs available, Suction available and Patient being monitored Patient Re-evaluated:Patient Re-evaluated prior to induction Oxygen Delivery Method: Circle system utilized Preoxygenation: Pre-oxygenation with 100% oxygen Induction Type: IV induction Ventilation: Mask ventilation without difficulty Laryngoscope Size: Mac and 4 Grade View: Grade II Tube type: Oral Tube size: 7.0 mm Number of attempts: 1 Airway Equipment and Method: Stylet and Oral airway Placement Confirmation: ETT inserted through vocal cords under direct vision, positive ETCO2 and breath sounds checked- equal and bilateral Secured at: 22 cm Tube secured with: Tape Dental Injury: Teeth and Oropharynx as per pre-operative assessment

## 2021-12-14 NOTE — Anesthesia Preprocedure Evaluation (Signed)
Anesthesia Evaluation  Patient identified by MRN, date of birth, ID band Patient awake    Reviewed: Allergy & Precautions, NPO status , Patient's Chart, lab work & pertinent test results  History of Anesthesia Complications Negative for: history of anesthetic complications  Airway Mallampati: III  TM Distance: >3 FB Neck ROM: Full    Dental  (+) Dental Advisory Given, Teeth Intact, Partial Lower,    Pulmonary shortness of breath, sleep apnea ,    breath sounds clear to auscultation       Cardiovascular hypertension, Pt. on medications (-) angina(-) Past MI and (-) CHF  Rhythm:Regular Rate:Normal     Neuro/Psych  Headaches, PSYCHIATRIC DISORDERS Depression  Neuromuscular disease    GI/Hepatic Neg liver ROS, GERD  ,  Endo/Other  diabetes, Type 2, Insulin DependentMorbid obesityInsulin pump  Renal/GU Renal disease     Musculoskeletal  (+) Arthritis ,   Abdominal (+) + obese,   Peds  Hematology negative hematology ROS (+)   Anesthesia Other Findings   Reproductive/Obstetrics                             Anesthesia Physical Anesthesia Plan  ASA: 3  Anesthesia Plan: General   Post-op Pain Management: Ofirmev IV (intra-op)   Induction:   PONV Risk Score and Plan: 3 and Ondansetron and Dexamethasone  Airway Management Planned: Oral ETT  Additional Equipment: None  Intra-op Plan:   Post-operative Plan: Extubation in OR  Informed Consent: I have reviewed the patients History and Physical, chart, labs and discussed the procedure including the risks, benefits and alternatives for the proposed anesthesia with the patient or authorized representative who has indicated his/her understanding and acceptance.     Dental advisory given  Plan Discussed with: CRNA and Anesthesiologist  Anesthesia Plan Comments:         Anesthesia Quick Evaluation

## 2021-12-14 NOTE — Transfer of Care (Signed)
Immediate Anesthesia Transfer of Care Note  Patient: [name]  Procedure(s) Performed: LUMBAR HEMATOMA EVACUATION  Patient Location: PACU  Anesthesia Type:General  Level of Consciousness: awake, alert  and oriented  Airway & Oxygen Therapy: Patient Spontanous Breathing  Post-op Assessment: Report given to RN and Post -op Vital signs reviewed and stable  Post vital signs: Reviewed and stable  Last Vitals:  Vitals Value Taken Time  BP 135/67 12/14/21 1850  Temp 36.6 C 12/14/21 1850  Pulse 92 12/14/21 1853  Resp 19 12/14/21 1853  SpO2 100 % 12/14/21 1853  Vitals shown include unvalidated device data.  Last Pain:  Vitals:   12/14/21 1642  TempSrc: Oral  PainSc:          Complications: No notable events documented.

## 2021-12-15 ENCOUNTER — Other Ambulatory Visit: Payer: Self-pay

## 2021-12-15 ENCOUNTER — Encounter (HOSPITAL_COMMUNITY): Payer: Self-pay | Admitting: Neurological Surgery

## 2021-12-15 ENCOUNTER — Ambulatory Visit: Payer: No Typology Code available for payment source | Admitting: Internal Medicine

## 2021-12-15 DIAGNOSIS — S064XAA Epidural hemorrhage with loss of consciousness status unknown, initial encounter: Secondary | ICD-10-CM

## 2021-12-15 DIAGNOSIS — M48062 Spinal stenosis, lumbar region with neurogenic claudication: Secondary | ICD-10-CM

## 2021-12-15 LAB — GLUCOSE, CAPILLARY
Glucose-Capillary: 75 mg/dL (ref 70–99)
Glucose-Capillary: 75 mg/dL (ref 70–99)
Glucose-Capillary: 97 mg/dL (ref 70–99)

## 2021-12-15 MED ORDER — ONDANSETRON HCL 4 MG/2ML IJ SOLN: 4.0000 mg | Freq: Four times a day (QID) | INTRAMUSCULAR | Status: DC | PRN

## 2021-12-15 MED ORDER — POTASSIUM CHLORIDE CRYS ER 10 MEQ PO TBCR
15.0000 meq | EXTENDED_RELEASE_TABLET | Freq: Two times a day (BID) | ORAL | Status: DC
  Filled 2021-12-15: qty 2

## 2021-12-15 MED ORDER — POLYETHYLENE GLYCOL 3350 17 G PO PACK: 17.0000 g | PACK | Freq: Every day | ORAL | Status: DC | PRN

## 2021-12-15 MED ORDER — LISINOPRIL 20 MG PO TABS
20.0000 mg | ORAL_TABLET | Freq: Every day | ORAL | Status: DC
  Administered 2021-12-15: 20 mg via ORAL
  Filled 2021-12-15: qty 1

## 2021-12-15 MED ORDER — LISINOPRIL-HYDROCHLOROTHIAZIDE 20-12.5 MG PO TABS: 1.0000 | ORAL_TABLET | Freq: Every day | ORAL | Status: DC

## 2021-12-15 MED ORDER — SODIUM CHLORIDE 0.9 % IV SOLN: 250.0000 mL | INTRAVENOUS | Status: DC

## 2021-12-15 MED ORDER — INSULIN ASPART 100 UNIT/ML IJ SOLN: 0.0000 [IU] | INTRAMUSCULAR | Status: DC

## 2021-12-15 MED ORDER — MENTHOL 3 MG MT LOZG: 1.0000 | LOZENGE | OROMUCOSAL | Status: DC | PRN

## 2021-12-15 MED ORDER — OXYCODONE HCL 5 MG PO TABS: 10.0000 mg | ORAL_TABLET | ORAL | Status: DC | PRN

## 2021-12-15 MED ORDER — SIMVASTATIN 20 MG PO TABS
40.0000 mg | ORAL_TABLET | Freq: Every day | ORAL | Status: DC
Start: 2021-12-15 — End: 2021-12-15
  Administered 2021-12-15: 40 mg via ORAL
  Filled 2021-12-15: qty 2

## 2021-12-15 MED ORDER — ACETAMINOPHEN 650 MG RE SUPP: 650.0000 mg | RECTAL | Status: DC | PRN

## 2021-12-15 MED ORDER — SODIUM CHLORIDE 0.9% FLUSH: 3.0000 mL | INTRAVENOUS | Status: DC | PRN

## 2021-12-15 MED ORDER — GABAPENTIN 600 MG PO TABS
600.0000 mg | ORAL_TABLET | Freq: Three times a day (TID) | ORAL | Status: DC
  Administered 2021-12-15: 600 mg via ORAL
  Filled 2021-12-15: qty 1

## 2021-12-15 MED ORDER — HYDROMORPHONE HCL 1 MG/ML IJ SOLN
INTRAMUSCULAR | Status: AC
  Administered 2021-12-15: 1 mg
  Filled 2021-12-15: qty 1

## 2021-12-15 MED ORDER — DOCUSATE SODIUM 100 MG PO CAPS
100.0000 mg | ORAL_CAPSULE | Freq: Two times a day (BID) | ORAL | Status: DC
  Administered 2021-12-15: 100 mg via ORAL
  Filled 2021-12-15: qty 1

## 2021-12-15 MED ORDER — OXYCODONE HCL 5 MG PO TABS
ORAL_TABLET | ORAL | Status: AC
  Filled 2021-12-15: qty 1

## 2021-12-15 MED ORDER — ALBUTEROL SULFATE (2.5 MG/3ML) 0.083% IN NEBU: 2.5000 mg | INHALATION_SOLUTION | Freq: Four times a day (QID) | RESPIRATORY_TRACT | Status: DC | PRN

## 2021-12-15 MED ORDER — OXYCODONE HCL 5 MG PO TABS
5.0000 mg | ORAL_TABLET | ORAL | Status: DC | PRN
  Administered 2021-12-15: 5 mg via ORAL

## 2021-12-15 MED ORDER — SODIUM CHLORIDE 0.9% FLUSH
3.0000 mL | Freq: Two times a day (BID) | INTRAVENOUS | Status: DC
  Administered 2021-12-15: 3 mL via INTRAVENOUS

## 2021-12-15 MED ORDER — PHENOL 1.4 % MT LIQD: 1.0000 | OROMUCOSAL | Status: DC | PRN

## 2021-12-15 MED ORDER — HYDROCHLOROTHIAZIDE 12.5 MG PO TABS
12.5000 mg | ORAL_TABLET | Freq: Every day | ORAL | Status: DC
  Administered 2021-12-15: 12.5 mg via ORAL
  Filled 2021-12-15: qty 1

## 2021-12-15 MED ORDER — CYCLOBENZAPRINE HCL 10 MG PO TABS: 10.0000 mg | ORAL_TABLET | Freq: Three times a day (TID) | ORAL | 0 refills | Status: DC | PRN

## 2021-12-15 MED ORDER — ACETAMINOPHEN 325 MG PO TABS: 650.0000 mg | ORAL_TABLET | ORAL | Status: DC | PRN

## 2021-12-15 MED ORDER — CYCLOBENZAPRINE HCL 10 MG PO TABS
10.0000 mg | ORAL_TABLET | Freq: Three times a day (TID) | ORAL | Status: DC | PRN
  Administered 2021-12-15: 10 mg via ORAL
  Filled 2021-12-15: qty 1

## 2021-12-15 MED ORDER — ALBUTEROL SULFATE HFA 108 (90 BASE) MCG/ACT IN AERS: 2.0000 | INHALATION_SPRAY | Freq: Four times a day (QID) | RESPIRATORY_TRACT | Status: DC | PRN

## 2021-12-15 MED ORDER — OXYCODONE HCL 5 MG PO TABS: 5.0000 mg | ORAL_TABLET | ORAL | 0 refills | Status: DC | PRN

## 2021-12-15 MED ORDER — HYDROMORPHONE HCL 1 MG/ML IJ SOLN
INTRAMUSCULAR | Status: AC
  Filled 2021-12-15: qty 1

## 2021-12-15 MED ORDER — ONDANSETRON HCL 4 MG PO TABS: 4.0000 mg | ORAL_TABLET | Freq: Four times a day (QID) | ORAL | Status: DC | PRN

## 2021-12-15 NOTE — Progress Notes (Signed)
Neurosurgery Service Progress Note  Subjective: No acute events overnight, radicular pain and deeper back pain resolved post-op, incisional pain increased as expected  Objective: Vitals:   12/15/21 0730 12/15/21 0807 12/15/21 0830 12/15/21 0837  BP:  (!) 106/44  (!) 107/48  Pulse: 75 79 77 88  Resp: 18 (!) 21 19 15   Temp: 98.3 F (36.8 C)  98 F (36.7 C) 98.2 F (36.8 C)  TempSrc:    Oral  SpO2: 100% 93% 94% 98%  Weight:      Height:        Physical Exam: Strength 5/5 x4, SILTx4, incision c/d/i  Assessment & Plan: 56 y.o. woman s/p L5-S1 TLIF/PLF w/ post-op epidural hematoma s/p evacuation, recovering well.  -discharge home today  Jadene Pierini  12/15/21 10:05 AM

## 2021-12-15 NOTE — Discharge Summary (Signed)
Discharge Summary  Date of Admission: 12/13/2021  Date of Discharge: 12/15/21  Attending Physician: Autumn Patty, MD  Hospital Course: Patient previously had an L5-S1 open TLIF/PLF and was sent to the ED for concern regarding a post-op epidural hematoma. MRI confirmed a hematoma and was was taken to the OR on 12/14/21 for uncomplicated hematoma evacuation. Her preop symptoms improved immediately post-op, her hospital course was uncomplicated and the patient was discharged home on 12/15/21. She will follow up in clinic with me in 2 weeks at her already scheduled appointment.  Neurologic exam at discharge:  Strength 5/5 x4, SILTx4  Discharge diagnosis: Post-operative lumbar epidural hematoma  Jadene Pierini, MD 12/15/21 10:08 AM

## 2021-12-15 NOTE — Patient Outreach (Signed)
[facility]) Care Management  12/15/2021  KARRAH POLCARI Sep 27, 1966 [id number]   [place] [facility] [ACO] Patient: [place] plan  Primary Care Provider:  [name], NP [facility] Primary Care, Embedded  Patient has been assigned to a [place] Management for telephonic chronic disease management services.      Plan: Patient will be followed by [place] Coordinator for employee support for post hospital needs.   For additional questions or referrals please contact:   Natividad Brood, RN BSN [facility] Liaison  [phone] business mobile phone Toll free office [phone]  Fax number: [phone] [email or web address]

## 2021-12-15 NOTE — Progress Notes (Signed)
Discharge instructions (including medications) discussed with and copy provided to patient/caregiver 

## 2021-12-16 LAB — CMP14+EGFR
ALT: 19 IU/L (ref 0–32)
AST: 26 IU/L (ref 0–40)
Albumin/Globulin Ratio: 1.8 (ref 1.2–2.2)
Albumin: 4.4 g/dL (ref 3.8–4.9)
Alkaline Phosphatase: 128 IU/L — ABNORMAL HIGH (ref 44–121)
BUN/Creatinine Ratio: 12 (ref 9–23)
BUN: 8 mg/dL (ref 6–24)
Bilirubin Total: 0.8 mg/dL (ref 0.0–1.2)
CO2: 24 mmol/L (ref 20–29)
Calcium: 9.3 mg/dL (ref 8.7–10.2)
Chloride: 102 mmol/L (ref 96–106)
Creatinine, Ser: 0.66 mg/dL (ref 0.57–1.00)
Globulin, Total: 2.5 g/dL (ref 1.5–4.5)
Glucose: 55 mg/dL — ABNORMAL LOW (ref 70–99)
Potassium: 3.9 mmol/L (ref 3.5–5.2)
Sodium: 144 mmol/L (ref 134–144)
Total Protein: 6.9 g/dL (ref 6.0–8.5)
eGFR: 104 mL/min/{1.73_m2} (ref >59)

## 2021-12-16 LAB — CBC WITH DIFFERENTIAL/PLATELET
Basophils Absolute: 0.1 10*3/uL (ref 0.0–0.2)
Basos: 1 %
EOS (ABSOLUTE): 0 10*3/uL (ref 0.0–0.4)
Eos: 0 %
Hematocrit: 39.8 % (ref 34.0–46.6)
Hemoglobin: 13.6 g/dL (ref 11.1–15.9)
Immature Grans (Abs): 0 10*3/uL (ref 0.0–0.1)
Immature Granulocytes: 0 %
Lymphocytes Absolute: 2.5 10*3/uL (ref 0.7–3.1)
Lymphs: 26 %
MCH: 28.3 pg (ref 26.6–33.0)
MCHC: 34.2 g/dL (ref 31.5–35.7)
MCV: 83 fL (ref 79–97)
Monocytes Absolute: 0.7 10*3/uL (ref 0.1–0.9)
Monocytes: 7 %
Neutrophils Absolute: 6.2 10*3/uL (ref 1.4–7.0)
Neutrophils: 66 %
Platelets: 306 10*3/uL (ref 150–450)
RBC: 4.8 x10E6/uL (ref 3.77–5.28)
RDW: 13.3 % (ref 11.7–15.4)
WBC: 9.4 10*3/uL (ref 3.4–10.8)

## 2021-12-16 LAB — HEMOGLOBIN A1C
Est. average glucose Bld gHb Est-mCnc: 117 mg/dL
Hgb A1c MFr Bld: 5.7 % — ABNORMAL HIGH (ref 4.8–5.6)

## 2021-12-16 LAB — LIPID PANEL WITH LDL/HDL RATIO
Cholesterol, Total: 117 mg/dL (ref 100–199)
HDL: 47 mg/dL (ref >39)
LDL Chol Calc (NIH): 53 mg/dL (ref 0–99)
LDL/HDL Ratio: 1.1 ratio (ref 0.0–3.2)
Triglycerides: 84 mg/dL (ref 0–149)
VLDL Cholesterol Cal: 17 mg/dL (ref 5–40)

## 2021-12-16 LAB — MICROALBUMIN / CREATININE URINE RATIO
Creatinine, Urine: 276.6 mg/dL
Microalb/Creat Ratio: 8 mg/g creat (ref 0–29)
Microalbumin, Urine: 23 ug/mL

## 2021-12-16 LAB — C-PEPTIDE: C-Peptide: 0.7 ng/mL — ABNORMAL LOW (ref 1.1–4.4)

## 2021-12-16 LAB — DIABETES AUTOIMMUNE PROFILE
Anti GAD 65 Antibodies: <5 U/mL
IA-2 Autoantibodies: <7.5 U/mL
Insulin AutoAb: 548 uU/mL — ABNORMAL HIGH
ZNT8 Antibodies: <15 U/mL

## 2021-12-18 ENCOUNTER — Ambulatory Visit: Payer: No Typology Code available for payment source | Admitting: Internal Medicine

## 2021-12-18 NOTE — Anesthesia Postprocedure Evaluation (Signed)
Anesthesia Post Note  Patient: [name]  Procedure(s) Performed: LUMBAR HEMATOMA EVACUATION     Patient location during evaluation: PACU Anesthesia Type: General Level of consciousness: awake and alert Pain management: pain level controlled Vital Signs Assessment: post-procedure vital signs reviewed and stable Respiratory status: spontaneous breathing, nonlabored ventilation, respiratory function stable and patient connected to nasal cannula oxygen Cardiovascular status: blood pressure returned to baseline and stable Postop Assessment: no apparent nausea or vomiting Anesthetic complications: no   No notable events documented.  Last Vitals:  Vitals:   12/15/21 0830 12/15/21 0837  BP:  (!) 107/48  Pulse: 77 88  Resp: 19 15  Temp: 36.7 C 36.8 C  SpO2: 94% 98%    Last Pain:  Vitals:   12/15/21 0837  TempSrc: Oral  PainSc:                  Izyk Marty

## 2021-12-19 ENCOUNTER — Telehealth: Payer: Self-pay | Admitting: *Deleted

## 2021-12-19 ENCOUNTER — Ambulatory Visit: Payer: No Typology Code available for payment source | Admitting: Gastroenterology

## 2021-12-19 NOTE — Chronic Care Management (AMB) (Signed)
°  Care Management   Note  12/19/2021 Name: [name] MRN: [id number] DOB: 01-29-1966  [name] is a 56 y.o. year old female who is a primary care patient of [name], NP. I reached out to Evon Slack by phone today in response to a referral sent by Ms. Alphonsa Overall Hartis's primary care provider.   Ms. Teats was given information about care management services today including:  Care management services include personalized support from designated clinical staff supervised by her physician, including individualized plan of care and coordination with other care providers 24/7 contact phone numbers for assistance for urgent and routine care needs. The patient may stop care management services at any time by phone call to the office staff.  Patient agreed to services and verbal consent obtained.   Follow up plan: Telephone appointment with care management team member scheduled for:12/26/21  [place] Management  Direct Dial: [phone]

## 2021-12-25 ENCOUNTER — Telehealth: Payer: Self-pay

## 2021-12-25 DIAGNOSIS — N200 Calculus of kidney: Secondary | ICD-10-CM

## 2021-12-26 ENCOUNTER — Ambulatory Visit: Payer: No Typology Code available for payment source | Admitting: *Deleted

## 2021-12-26 DIAGNOSIS — I10 Essential (primary) hypertension: Secondary | ICD-10-CM

## 2021-12-26 DIAGNOSIS — E1165 Type 2 diabetes mellitus with hyperglycemia: Secondary | ICD-10-CM

## 2021-12-26 NOTE — Patient Instructions (Addendum)
Visit Information  Thank you for taking time to visit with me today. Please don't hesitate to contact me if I can be of assistance to you before our next scheduled telephone appointment.  Following are the goals we discussed today:  Take medications as prescribed   Attend all scheduled provider appointments Call provider office for new concerns or questions  check blood sugar at prescribed times: three times daily check feet daily for cuts, sores or redness take the blood sugar log to all doctor visits take the blood sugar meter to all doctor visits manage portion size check blood pressure weekly choose a place to take my blood pressure (home, clinic or office, retail store) write blood pressure results in a log or diary take blood pressure log to all doctor appointments take medications for blood pressure exactly as prescribed Please look over and complete advanced directives packet mailed Look over education sent via My Chart- low sodium diet and hypoglycemia Take pain medication as prescribed, practice relaxation and keep stress to a minimum  Our next appointment is by telephone on 02/06/22 at 9 am  Please call the care guide team at [phone] if you need to cancel or reschedule your appointment.   If you are experiencing a Mental Health or [place] or need someone to talk to, please call the Suicide and Crisis Lifeline: 988 call the [place] National Suicide Prevention Lifeline: (878) 120-7414 or TTY: (220)025-0923 TTY 623-693-2617) to talk to a trained counselor call 1-800-273-TALK (toll free, 24 hour hotline) go to [facility] [place] [phone]) call the [place]: [phone] call 911   Following is a copy of your full plan of care:  Care Plan : [place] of Care  Updates made by [name] Mends, RN since 12/26/2021 12:00 AM     Problem: No plan of care established for  management of chronic disease states  (DM2, HTN, Chronic pain)   Priority: High     Long-Range Goal: Development of plan of care for chronic disease management  (DM2, HTN, Chronic pain)   Start Date: 12/26/2021  Expected End Date: 06/24/2022  Priority: High  Note:   Current Barriers:  Knowledge Deficits related to plan of care for management of HTN and DMII  Patient reports she lives with spouse and was working full time and independent in all aspects of her care until she recently had 2 back surgeries, pt reports she has walker and uses as needed mainly when leaving home.  Pt reports she checks CBG TID with fasting ranges 70-120 and random ranges 120-140.  Pt reports she will be meeting with endocrinologist about "coming off insulin pump since [facility] is much better"  Pt reports she has a blood pressure cuff but does not currently check blood pressure. Pt is following up with primary care doctor tomorrow 1/18 and plans to discuss ongoing pain issues with her back. Patient has no advanced directives and requests information be mailed.  RNCM Clinical Goal(s):  Patient will verbalize understanding of plan for management of HTN and DMII as evidenced by patient report, review of EHR and  through collaboration with RN Care manager, provider, and care team.   Interventions: 1:1 collaboration with primary care provider regarding development and update of comprehensive plan of care as evidenced by provider attestation and co-signature Inter-disciplinary care team collaboration (see longitudinal plan of care) Evaluation of current treatment plan related to  self management and patient's adherence to  plan as established by provider   Diabetes Interventions:  (Status:  New goal. and Goal on track:  Yes.) Long Term Goal Assessed patient's understanding of A1c goal: <7% Provided education to patient about basic DM disease process Reviewed medications with patient and discussed importance of medication  adherence Discussed plans with patient for ongoing care management follow up and provided patient with direct contact information for care management team Provided patient with written educational materials related to hypo and hyperglycemia and importance of correct treatment Review of patient status, including review of consultants reports, relevant laboratory and other test results, and medications completed Screening for signs and symptoms of depression related to chronic disease state  Assessed social determinant of health barriers Reviewed pain management strategies and keeping stress to a minimum Lab Results  Component Value Date   HGBA1C 5.7 (H) 11/30/2021   Hypertension Interventions:  (Status:  New goal. and Goal on track:  Yes.) Long Term Goal Last practice recorded BP readings:  BP Readings from Last 3 Encounters:  12/15/21 (!) 107/48  12/06/21 (!) 109/51  12/01/21 132/76  Most recent eGFR/CrCl:  Lab Results  Component Value Date   EGFR 104 11/30/2021    No components found for: CRCL  Evaluation of current treatment plan related to hypertension self management and patient's adherence to plan as established by provider Reviewed medications with patient and discussed importance of compliance Discussed plans with patient for ongoing care management follow up and provided patient with direct contact information for care management team Advised patient, providing education and rationale, to monitor blood pressure daily and record, calling PCP for findings outside established parameters Discussed complications of poorly controlled blood pressure such as heart disease, stroke, circulatory complications, vision complications, kidney impairment, sexual dysfunction  Pain assessment completed Advanced directives packet mailed to patient's home Reviewed upcoming scheduled appointments Education sent via My Chart- low sodium diet  Patient Goals/Self-Care Activities: Take medications  as prescribed   Attend all scheduled provider appointments Call provider office for new concerns or questions  check blood sugar at prescribed times: three times daily check feet daily for cuts, sores or redness take the blood sugar log to all doctor visits take the blood sugar meter to all doctor visits manage portion size check blood pressure weekly choose a place to take my blood pressure (home, clinic or office, retail store) write blood pressure results in a log or diary take blood pressure log to all doctor appointments take medications for blood pressure exactly as prescribed Please look over and complete advanced directives packet mailed Look over education sent via My Chart- low sodium diet and hypoglycemia Take pain medication as prescribed, practice relaxation and keep stress to a minimum       [name] was given information about Care Management services by the embedded care coordination team including:  Care Management services include personalized support from designated clinical staff supervised by her physician, including individualized plan of care and coordination with other care providers 24/7 contact phone numbers for assistance for urgent and routine care needs. The patient may stop CCM services at any time (effective at the end of the month) by phone call to the office staff.  Patient agreed to services and verbal consent obtained.   Patient verbalizes understanding of instructions and care plan provided today and agrees to view in [place]. Active MyChart status confirmed with patient.    Telephone follow up appointment with care management team member scheduled for:  02/06/22 Hypoglycemia Hypoglycemia is when  the sugar (glucose) level in your blood is too low. Low blood sugar can happen to people who have diabetes and people who do not have diabetes. Low blood sugar can happen quickly, and it can be an emergency. What are the causes? This condition happens  most often in people who have diabetes. It may be caused by: Diabetes medicine. Not eating enough, or not eating often enough. Doing more physical activity. Drinking alcohol on an empty stomach. If you do not have diabetes, this condition may be caused by: A tumor in the pancreas. Not eating enough, or not eating for long periods at a time (fasting). A very bad infection or illness. Problems after having weight loss (bariatric) surgery. Kidney failure or liver failure. Certain medicines. What increases the risk? This condition is more likely to develop in people who: Have diabetes and take medicines to lower their blood sugar. Abuse alcohol. Have a very bad illness. What are the signs or symptoms? Mild Hunger. Sweating and feeling clammy. Feeling dizzy or light-headed. Being sleepy or having trouble sleeping. Feeling like you may vomit (nauseous). A fast heartbeat. A headache. Blurry vision. Mood changes, such as: Being grouchy. Feeling worried or nervous (anxious). Tingling or loss of feeling (numbness) around your mouth, lips, or tongue. Moderate Confusion and poor judgment. Behavior changes. Weakness. Uneven heartbeat. Trouble with moving (coordination). Very low Very low blood sugar (severe hypoglycemia) is a medical emergency. It can cause: Fainting. Seizures. Loss of consciousness (coma). Death. How is this treated? Treating low blood sugar Low blood sugar is often treated by eating or drinking something that has sugar in it right away. The food or drink should contain 15 grams of a fast-acting carb (carbohydrate). Options include: 4 oz (120 mL) of fruit juice. 4 oz (120 DASH Eating Plan DASH stands for Dietary Approaches to Stop Hypertension. The DASH eating plan is a healthy eating plan that has been shown to: Reduce high blood pressure (hypertension). Reduce your risk for type 2 diabetes, heart disease, and stroke. Help with weight loss. What are tips for  following this plan? Reading food labels Check food labels for the amount of salt (sodium) per serving. Choose foods with less than 5 percent of the Daily Value of sodium. Generally, foods with less than 300 milligrams (mg) of sodium per serving fit into this eating plan. To find whole grains, look for the word "whole" as the first word in the ingredient list. Shopping Buy products labeled as "low-sodium" or "no salt added." Buy fresh foods. Avoid canned foods and pre-made or frozen meals. Cooking Avoid adding salt when cooking. Use salt-free seasonings or herbs instead of table salt or sea salt. Check with your health care provider or pharmacist before using salt substitutes. Do not fry foods. Cook foods using healthy methods such as baking, boiling, grilling, roasting, and broiling instead. Cook with heart-healthy oils, such as olive, canola, avocado, soybean, or sunflower oil. Meal planning  Eat a balanced diet that includes: 4 or more servings of fruits and 4 or more servings of vegetables each day. Try to fill one-half of your plate with fruits and vegetables. 6-8 servings of whole grains each day. Less than 6 oz (170 g) of lean meat, poultry, or fish each day. A 3-oz (85-g) serving of meat is about the same size as a deck of cards. One egg equals 1 oz (28 g). 2-3 servings of low-fat dairy each day. One serving is 1 cup (237 mL). 1 serving of nuts, seeds, or  beans 5 times each week. 2-3 servings of heart-healthy fats. Healthy fats called omega-3 fatty acids are found in foods such as walnuts, flaxseeds, fortified milks, and eggs. These fats are also found in cold-water fish, such as sardines, salmon, and mackerel. Limit how much you eat of: Canned or prepackaged foods. Food that is high in trans fat, such as some fried foods. Food that is high in saturated fat, such as fatty meat. Desserts and other sweets, sugary drinks, and other foods with added sugar. Full-fat dairy products. Do  not salt foods before eating. Do not eat more than 4 egg yolks a week. Try to eat at least 2 vegetarian meals a week. Eat more home-cooked food and less restaurant, buffet, and fast food. Lifestyle When eating at a restaurant, ask that your food be prepared with less salt or no salt, if possible. If you drink alcohol: Limit how much you use to: 0-1 drink a day for women who are not pregnant. 0-2 drinks a day for men. Be aware of how much alcohol is in your drink. In the [place] one drink equals one 12 oz bottle of beer (355 mL), one 5 oz glass of wine (148 mL), or one 1 oz glass of hard liquor (44 mL). General information Avoid eating more than 2,300 mg of salt a day. If you have hypertension, you may need to reduce your sodium intake to 1,500 mg a day. Work with your health care provider to maintain a healthy body weight or to lose weight. Ask what an ideal weight is for you. Get at least 30 minutes of exercise that causes your heart to beat faster (aerobic exercise) most days of the week. Activities may include walking, swimming, or biking. Work with your health care provider or dietitian to adjust your eating plan to your individual calorie needs. What foods should I eat? Fruits All fresh, dried, or frozen fruit. Canned fruit in natural juice (without added sugar). Vegetables Fresh or frozen vegetables (raw, steamed, roasted, or grilled). Low-sodium or reduced-sodium tomato and vegetable juice. Low-sodium or reduced-sodium tomato sauce and tomato paste. Low-sodium or reduced-sodium canned vegetables. Grains Whole-grain or whole-wheat bread. Whole-grain or whole-wheat pasta. Brown rice. [place]. Bulgur. Whole-grain and low-sodium cereals. Pita bread. Low-fat, low-sodium crackers. Whole-wheat flour tortillas. Meats and other proteins Skinless chicken or [place]. Ground chicken or [place]. Pork with fat trimmed off. Fish and seafood. Egg whites. Dried beans, peas, or lentils. Unsalted  nuts, nut butters, and seeds. Unsalted canned beans. Lean cuts of beef with fat trimmed off. Low-sodium, lean precooked or cured meat, such as sausages or meat loaves. Dairy Low-fat (1%) or fat-free (skim) milk. Reduced-fat, low-fat, or fat-free cheeses. Nonfat, low-sodium ricotta or cottage cheese. Low-fat or nonfat yogurt. Low-fat, low-sodium cheese. Fats and oils Soft margarine without trans fats. Vegetable oil. Reduced-fat, low-fat, or light mayonnaise and salad dressings (reduced-sodium). Canola, safflower, olive, avocado, soybean, and sunflower oils. Avocado. Seasonings and condiments Herbs. Spices. Seasoning mixes without salt. Other foods Unsalted popcorn and pretzels. Fat-free sweets. The items listed above may not be a complete list of foods and beverages you can eat. Contact a dietitian for more information. What foods should I avoid? Fruits Canned fruit in a light or heavy syrup. Fried fruit. Fruit in cream or butter sauce. Vegetables Creamed or fried vegetables. Vegetables in a cheese sauce. Regular canned vegetables (not low-sodium or reduced-sodium). Regular canned tomato sauce and paste (not low-sodium or reduced-sodium). Regular tomato and vegetable juice (not low-sodium or reduced-sodium). Angie Fava.  Olives. Grains Baked goods made with fat, such as croissants, muffins, or some breads. Dry pasta or rice meal packs. Meats and other proteins Fatty cuts of meat. Ribs. Fried meat. Berniece Salines. Bologna, salami, and other precooked or cured meats, such as sausages or meat loaves. Fat from the back of a pig (fatback). Bratwurst. Salted nuts and seeds. Canned beans with added salt. Canned or smoked fish. Whole eggs or egg yolks. Chicken or [place] with skin. Dairy Whole or 2% milk, cream, and half-and-half. Whole or full-fat cream cheese. Whole-fat or sweetened yogurt. Full-fat cheese. Nondairy creamers. Whipped toppings. Processed cheese and cheese spreads. Fats and oils Butter. Stick  margarine. Lard. Shortening. Ghee. Bacon fat. Tropical oils, such as coconut, palm kernel, or palm oil. Seasonings and condiments Onion salt, garlic salt, seasoned salt, table salt, and sea salt. Worcestershire sauce. Tartar sauce. Barbecue sauce. Teriyaki sauce. Soy sauce, including reduced-sodium. Steak sauce. Canned and packaged gravies. Fish sauce. Oyster sauce. Cocktail sauce. Store-bought horseradish. Ketchup. Mustard. Meat flavorings and tenderizers. Bouillon cubes. Hot sauces. Pre-made or packaged marinades. Pre-made or packaged taco seasonings. Relishes. Regular salad dressings. Other foods Salted popcorn and pretzels. The items listed above may not be a complete list of foods and beverages you should avoid. Contact a dietitian for more information. Where to find more information National Heart, Lung, and Blood Institute: https://wilson-eaton.com/ American Heart Association: www.heart.org Academy of Nutrition and Dietetics: www.eatright.[place]: www.kidney.org Summary The DASH eating plan is a healthy eating plan that has been shown to reduce high blood pressure (hypertension). It may also reduce your risk for type 2 diabetes, heart disease, and stroke. When on the DASH eating plan, aim to eat more fresh fruits and vegetables, whole grains, lean proteins, low-fat dairy, and heart-healthy fats. With the DASH eating plan, you should limit salt (sodium) intake to 2,300 mg a day. If you have hypertension, you may need to reduce your sodium intake to 1,500 mg a day. Work with your health care provider or dietitian to adjust your eating plan to your individual calorie needs. This information is not intended to replace advice given to you by your health care provider. Make sure you discuss any questions you have with your health care provider. Document Revised: 10/30/2019 Document Reviewed: 10/30/2019 Elsevier Patient Education  2022 [place]   [name] [facility], BSN RN  [employer] copywriter Primary Care [phone]

## 2021-12-26 NOTE — Chronic Care Management (AMB) (Signed)
Care Management    RN Visit Note  12/26/2021 Name: [name] MRN: [id number] DOB: 1966-10-24  Subjective: [name] is a 56 y.o. year old female who is a primary care patient of [name], NP. The care management team was consulted for assistance with disease management and care coordination needs.    Engaged with patient by telephone for initial visit in response to provider referral for case management and/or care coordination services.   Consent to Services:   Ms. Taves was given information about Care Management services today including:  Care Management services includes personalized support from designated clinical staff supervised by her physician, including individualized plan of care and coordination with other care providers 24/7 contact phone numbers for assistance for urgent and routine care needs. The patient may stop case management services at any time by phone call to the office staff.  Patient agreed to services and consent obtained.   Assessment: Review of patient past medical history, allergies, medications, health status, including review of consultants reports, laboratory and other test data, was performed as part of comprehensive evaluation and provision of chronic care management services.   SDOH (Social Determinants of Health) assessments and interventions performed:  SDOH Interventions    Flowsheet Row Most Recent Value  SDOH Interventions   Food Insecurity Interventions Intervention Not Indicated  Transportation Interventions Intervention Not Indicated        Care Plan  Allergies  Allergen Reactions   Ciprofloxacin Hives and Swelling   Penicillins Anaphylaxis and Rash   Codeine Other (See Comments)    had hallicunations   Morphine Nausea And Vomiting and Other (See Comments)    recieved in ED due to [facility] and had multple doses - gave visual hallucinations and vomitting.   Sulfonamide Derivatives Other (See Comments)     REACTION: Unsure - childhood allergy   Vancomycin Itching    Outpatient Encounter Medications as of 12/26/2021  Medication Sig Note   albuterol (VENTOLIN HFA) 108 (90 Base) MCG/ACT inhaler Inhale 2 puffs into the lungs every 6 (six) hours as needed for wheezing or shortness of breath.     blood glucose meter kit and supplies Use up to four times daily as directed.    cyclobenzaprine (FLEXERIL) 10 MG tablet Take 1 tablet (10 mg total) by mouth 3 (three) times daily as needed for muscle spasms.    gabapentin (NEURONTIN) 600 MG tablet TAKE 1 TABLET BY MOUTH THREE TIMES DAILY    glucose blood test strip Test up to 4 times a day    insulin lispro (HUMALOG) 100 UNIT/ML injection Inject 80 units under the skin via omnipod daily 12/05/2021: Continuous insulin pump   Lancets (FREESTYLE) lancets Use to test 4 times daily    lisinopril-hydrochlorothiazide (ZESTORETIC) 20-12.5 MG tablet Take 1 tablet by mouth daily.    ondansetron (ZOFRAN) 4 MG tablet Take 1 tablet (4 mg total) by mouth every 8 (eight) hours as needed for nausea or vomiting.    oxyCODONE (OXY IR/ROXICODONE) 5 MG immediate release tablet Take 1 tablet (5 mg total) by mouth every 4 (four) hours as needed (pain).    potassium chloride SA (KLOR-CON M15) 15 MEQ tablet Take 15 mEq by mouth 2 (two) times daily.    simvastatin (ZOCOR) 40 MG tablet Take 40 mg by mouth daily.     tirzepatide [facility]) 5 MG/0.5ML Pen Inject 5 mg into the skin once a week. Monday    No facility-administered encounter medications on file as of  12/26/2021.    Patient Active Problem List   Diagnosis Date Noted   Epidural hematoma 12/14/2021   Spondylolisthesis of lumbar region 12/05/2021   Spinal stenosis of lumbar region with neurogenic claudication 10/25/2021   Chronic bilateral low back pain with left-sided sciatica 10/18/2021   Dyspepsia 01/18/2021   Constipation 01/18/2021   GERD (gastroesophageal reflux disease) 03/09/2020   Preventative health care  07/15/2019   Other intervertebral disc degeneration, lumbar region 05/24/2019   OSA (obstructive sleep apnea) 12/08/2014   Depression 07/15/2013   Dyspnea 11/12/2012   Headache(784.0) 10/14/2012   Neuropathy 10/14/2012   Essential hypertension 05/10/2012   Diabetes mellitus ([place]) 09/29/2008   HLD (hyperlipidemia) 09/29/2008   Morbid obesity due to excess calories ([place]) 08/31/2008    Conditions to be addressed/monitored: HTN and DMII  Care Plan : RN Care Manager Plan of Care  Updates made by Kassie Mends, RN since 12/26/2021 12:00 AM     Problem: No plan of care established for management of chronic disease states  (DM2, HTN, Chronic pain)   Priority: High     Long-Range Goal: Development of plan of care for chronic disease management  (DM2, HTN, Chronic pain)   Start Date: 12/26/2021  Expected End Date: 06/24/2022  Priority: High  Note:   Current Barriers:  Knowledge Deficits related to plan of care for management of HTN and DMII  Patient reports she lives with spouse and was working full time and independent in all aspects of her care until she recently had 2 back surgeries, pt reports she has walker and uses as needed mainly when leaving home.  Pt reports she checks CBG TID with fasting ranges 70-120 and random ranges 120-140.  Pt reports she will be meeting with endocrinologist about "coming off insulin pump since [facility] is much better"  Pt reports she has a blood pressure cuff but does not currently check blood pressure. Pt is following up with primary care doctor tomorrow 1/18 and plans to discuss ongoing pain issues with her back. Patient has no advanced directives and requests information be mailed.  RNCM Clinical Goal(s):  Patient will verbalize understanding of plan for management of HTN and DMII as evidenced by patient report, review of EHR and  through collaboration with RN Care manager, provider, and care team.   Interventions: 1:1 collaboration with primary care  provider regarding development and update of comprehensive plan of care as evidenced by provider attestation and co-signature Inter-disciplinary care team collaboration (see longitudinal plan of care) Evaluation of current treatment plan related to  self management and patient's adherence to plan as established by provider   Diabetes Interventions:  (Status:  New goal. and Goal on track:  Yes.) Long Term Goal Assessed patient's understanding of A1c goal: <7% Provided education to patient about basic DM disease process Reviewed medications with patient and discussed importance of medication adherence Discussed plans with patient for ongoing care management follow up and provided patient with direct contact information for care management team Provided patient with written educational materials related to hypo and hyperglycemia and importance of correct treatment Review of patient status, including review of consultants reports, relevant laboratory and other test results, and medications completed Screening for signs and symptoms of depression related to chronic disease state  Assessed social determinant of health barriers Reviewed pain management strategies and keeping stress to a minimum Lab Results  Component Value Date   HGBA1C 5.7 (H) 11/30/2021   Hypertension Interventions:  (Status:  New goal. and Goal on  track:  Yes.) Long Term Goal Last practice recorded BP readings:  BP Readings from Last 3 Encounters:  12/15/21 (!) 107/48  12/06/21 (!) 109/51  12/01/21 132/76  Most recent eGFR/CrCl:  Lab Results  Component Value Date   EGFR 104 11/30/2021    No components found for: CRCL  Evaluation of current treatment plan related to hypertension self management and patient's adherence to plan as established by provider Reviewed medications with patient and discussed importance of compliance Discussed plans with patient for ongoing care management follow up and provided patient with direct  contact information for care management team Advised patient, providing education and rationale, to monitor blood pressure daily and record, calling PCP for findings outside established parameters Discussed complications of poorly controlled blood pressure such as heart disease, stroke, circulatory complications, vision complications, kidney impairment, sexual dysfunction  Pain assessment completed Advanced directives packet mailed to patient's home Reviewed upcoming scheduled appointments Education sent via My Chart- low sodium diet  Patient Goals/Self-Care Activities: Take medications as prescribed   Attend all scheduled provider appointments Call provider office for new concerns or questions  check blood sugar at prescribed times: three times daily check feet daily for cuts, sores or redness take the blood sugar log to all doctor visits take the blood sugar meter to all doctor visits manage portion size check blood pressure weekly choose a place to take my blood pressure (home, clinic or office, retail store) write blood pressure results in a log or diary take blood pressure log to all doctor appointments take medications for blood pressure exactly as prescribed Please look over and complete advanced directives packet mailed Look over education sent via My Chart- low sodium diet and hypoglycemia Take pain medication as prescribed, practice relaxation and keep stress to a minimum       Plan: Telephone follow up appointment with care management team member scheduled for:  02/06/22  Jacqlyn Larsen [facility], BSN RN Case Manager [facility] [phone]

## 2021-12-27 ENCOUNTER — Telehealth: Payer: Self-pay

## 2021-12-27 NOTE — Telephone Encounter (Signed)
Opened in error

## 2021-12-27 NOTE — Telephone Encounter (Signed)
She needs an office visit. Unclear why she waited a year?

## 2021-12-27 NOTE — Telephone Encounter (Signed)
Pt is already scheduled for OV 04/18/22. Called pt, informed her we are unable to submit PA for GES and she will need OV. Pt requested for GES to be cancelled. [facility]. LMOVM to inform Kaylee at [facility]service [facility].

## 2021-12-27 NOTE — Telephone Encounter (Signed)
Kaylee at [facility]service center called office, pt is scheduled for GES 12/29/21. Insurance requires PA for GES. Informed Lisette AbuKaylee pt was last seen in our office 01/18/21. GES was scheduled 01/30/21.  Tobi Bastosnna, pt cancelled OV appt for 12/19/21. Do you want to submit PA for GES since she hasn't been seen since 01/18/21. Clinical notes will need to be submitted for PA.

## 2021-12-28 ENCOUNTER — Other Ambulatory Visit (HOSPITAL_COMMUNITY): Payer: Self-pay

## 2021-12-29 ENCOUNTER — Encounter (HOSPITAL_COMMUNITY): Payer: Self-pay

## 2021-12-29 ENCOUNTER — Ambulatory Visit (HOSPITAL_COMMUNITY): Payer: No Typology Code available for payment source

## 2021-12-29 ENCOUNTER — Other Ambulatory Visit (HOSPITAL_COMMUNITY): Payer: Self-pay

## 2021-12-30 ENCOUNTER — Other Ambulatory Visit (HOSPITAL_COMMUNITY): Payer: Self-pay

## 2021-12-30 MED ORDER — GABAPENTIN 600 MG PO TABS
600.0000 mg | ORAL_TABLET | Freq: Three times a day (TID) | ORAL | 2 refills | Status: DC
  Filled 2021-12-30: qty 90, 30d supply, fill #0
  Filled 2022-02-08: qty 90, 30d supply, fill #1
  Filled 2022-04-18: qty 90, 30d supply, fill #2

## 2022-01-01 ENCOUNTER — Ambulatory Visit (INDEPENDENT_AMBULATORY_CARE_PROVIDER_SITE_OTHER): Payer: No Typology Code available for payment source | Admitting: "Endocrinology

## 2022-01-01 ENCOUNTER — Other Ambulatory Visit (HOSPITAL_COMMUNITY): Payer: Self-pay

## 2022-01-01 ENCOUNTER — Other Ambulatory Visit: Payer: Self-pay

## 2022-01-01 ENCOUNTER — Encounter: Payer: Self-pay | Admitting: "Endocrinology

## 2022-01-01 VITALS — BP 116/68 | HR 92 | Ht <= 58 in | Wt 198.4 lb

## 2022-01-01 DIAGNOSIS — E782 Mixed hyperlipidemia: Secondary | ICD-10-CM | POA: Diagnosis not present

## 2022-01-01 DIAGNOSIS — I10 Essential (primary) hypertension: Secondary | ICD-10-CM

## 2022-01-01 DIAGNOSIS — Z794 Long term (current) use of insulin: Secondary | ICD-10-CM | POA: Diagnosis not present

## 2022-01-01 DIAGNOSIS — E1169 Type 2 diabetes mellitus with other specified complication: Secondary | ICD-10-CM | POA: Diagnosis not present

## 2022-01-01 NOTE — Patient Instructions (Signed)

## 2022-01-01 NOTE — Progress Notes (Signed)
Endocrinology Consult Note       01/01/2022, 6:12 PM   Subjective:    Patient ID: [name], female    DOB: December 03, 1966.  [name] is being seen in consultation for management of currently uncontrolled symptomatic diabetes requested by  Lindell Spar, MD.   Past Medical History:  Diagnosis Date   Diabetes mellitus    insulin pump 2013   Dysphagia 03/09/2020   Hypercholesteremia    Hypertension    Incarcerated umbilical hernia 05/08/510   Kidney stone    Kidney stones 08/31/2008   Qualifier: Diagnosis of  By: Jonna Munro MD, Cornelius     Neuropathy    Obesity    Obstructive sleep apnea    Palpitations    Shortness of breath     Past Surgical History:  Procedure Laterality Date   BIOPSY  06/23/2020   Procedure: BIOPSY;  Surgeon: Daneil Dolin, MD;  [facility];  Service: Endoscopy;;   CARDIAC CATHETERIZATION  12/11/2012   normal coronary arteries   CESAREAN SECTION  1994;2000   [place]   COLONOSCOPY WITH PROPOFOL N/A 06/23/2020   large amount of stool in entire colon, prep inadequate.    CYSTOSCOPY W/ URETERAL STENT PLACEMENT  05/08/2012   Procedure: CYSTOSCOPY WITH RETROGRADE PYELOGRAM/URETERAL STENT PLACEMENT;  Surgeon: Marissa Nestle, MD;  Location: [facility];  Service: Urology;  Laterality: Right;   CYSTOSCOPY WITH RETROGRADE PYELOGRAM, URETEROSCOPY AND STENT PLACEMENT Right 05/04/2020   Procedure: CYSTOSCOPY WITH RIGHT RETROGRADE PYELOGRAM, RIGHT URETEROSCOPY AND RIGHT URETERAL STENT EXCHANGE;  Surgeon: Cleon Gustin, MD;  Location: [facility];  Service: Urology;  Laterality: Right;   CYSTOSCOPY WITH RETROGRADE PYELOGRAM, URETEROSCOPY AND STENT PLACEMENT Left 08/08/2020   Procedure: CYSTOSCOPY WITH RETROGRADE PYELOGRAM, URETEROSCOPY WITH LASER  AND STENT PLACEMENT;  Surgeon: Cleon Gustin, MD;  Location[facility];  Service: Urology;  Laterality:  Left;   CYSTOSCOPY WITH RETROGRADE PYELOGRAM, URETEROSCOPY AND STENT PLACEMENT Right 11/08/2020   Procedure: CYSTOSCOPY WITH RIGHT RETROGRADE PYELOGRAM, RIGHT URETEROSCOPY AND STENT PLACEMENT;  Surgeon: Cleon Gustin, MD;  [facility];  Service: Urology;  Laterality: Right;   CYSTOSCOPY WITH RETROGRADE PYELOGRAM, URETEROSCOPY AND STENT PLACEMENT Left 12/27/2020   Procedure: CYSTOSCOPY WITH LEFT RETROGRADE PYELOGRAM, LEFT URETEROSCOPY AND LEFT URETERAL STENT PLACEMENT;  Surgeon: Cleon Gustin, MD;  [facility];  Service: Urology;  Laterality: Left;   CYSTOSCOPY WITH RETROGRADE PYELOGRAM, URETEROSCOPY AND STENT PLACEMENT Bilateral 03/16/2021   Procedure: CYSTOSCOPY WITH BILATERAL  RETROGRADE PYELOGRAM, BILATERAL URETEROSCOPY AND STENT PLACEMENT ON THE LEFT;  Surgeon: Cleon Gustin, MD;  [facility];  Service: Urology;  Laterality: Bilateral;   CYSTOSCOPY WITH STENT PLACEMENT Right 02/25/2020   Procedure: CYSTOSCOPY WITH RIGHT RETROGRADE RIGHT STENT PLACEMENT;  Surgeon: Festus Aloe, MD;  Location[facility];  Service: Urology;  Laterality: Right;   ESOPHAGOGASTRODUODENOSCOPY (EGD) WITH PROPOFOL N/A 06/23/2020   Normal esophagus, multiple localized erosions were found in gastric antrum s/p biopsy, normal duodenum. Empiric dilation.Reactive gastropathy with erosions, negative H.pylori, no dysplasia.     EXTRACORPOREAL SHOCK WAVE LITHOTRIPSY Right 10/11/2020  Procedure: EXTRACORPOREAL SHOCK WAVE LITHOTRIPSY (ESWL);  Surgeon: Cleon Gustin, MD;  [facility];  Service: Urology;  Laterality: Right;   FOOT SURGERY Right March, 2013   [facility]-removal of bone spur   HOLMIUM LASER APPLICATION  6/81/1572   Procedure: HOLMIUM LASER LITHOTRIPSY RIGHT URETERAL CALCULUS;  Surgeon: Cleon Gustin, MD;  [facility]: [facility];  Service: Urology;;   HYSTEROSCOPY WITH THERMACHOICE  04/25/2012   Procedure: HYSTEROSCOPY WITH THERMACHOICE;  Surgeon: Florian Buff, MD;   Location: [facility];  Service: Gynecology;  Laterality: N/A;  total therapy time= 11 minutes 42 seconds; 34 ml D5w  in and 34 ml D5w out; temp =87 degrees F   LEFT HEART CATHETERIZATION WITH CORONARY ANGIOGRAM N/A 12/11/2012   Procedure: LEFT HEART CATHETERIZATION WITH CORONARY ANGIOGRAM;  Surgeon: Lorretta Harp, MD;  Location: [facility] CATH LAB;  Service: Cardiovascular;  Laterality: N/A;   LUMBAR WOUND DEBRIDEMENT N/A 12/14/2021   Procedure: LUMBAR HEMATOMA EVACUATION;  Surgeon: Judith Part, MD;  Location: [place];  Service: Neurosurgery;  Laterality: N/A;   MALONEY DILATION N/A 06/23/2020   Procedure: Venia Minks DILATION;  Surgeon: Daneil Dolin, MD;  [facility];  Service: Endoscopy;  Laterality: N/A;   Sinus sergery  1988   [place]   STONE EXTRACTION WITH BASKET Right 11/08/2020   Procedure: STONE EXTRACTION WITH BASKET;  Surgeon: Cleon Gustin, MD;  Location: [facility];  Service: Urology;  Laterality: Right;   TRANSFORAMINAL LUMBAR INTERBODY FUSION (TLIF) WITH PEDICLE SCREW FIXATION 1 LEVEL N/A 12/05/2021   Procedure: Open Lumbar five-Sacral one Laminectomy/Transforaminal Lumbar Interbody Fusion/Posterolateral instrumented fusion;  Surgeon: Judith Part, MD;  Location: [place];  Service: Neurosurgery;  Laterality: N/A;  3C   UMBILICAL HERNIA REPAIR N/A 03/25/2014   Procedure: UMBILICAL HERNIA REPAIR;  Surgeon: Scherry Ran, MD;  Location: [facility];  Service: General;  Laterality: N/A;   uterine ablation      Social History   Socioeconomic History   Marital status: Married    Spouse name: Not on file   Number of children: 2   Years of education: college   Highest education level: Not on file  Occupational History   Occupation: CNA    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: [employer]   Occupation: Quarry manager- at [employer]  Tobacco Use   Smoking status: Never   Smokeless tobacco: Never  Vaping Use   Vaping Use: Never used  Substance and Sexual Activity    Alcohol use: No   Drug use: No   Sexual activity: Yes    Partners: Male    Comment: ablation  Other Topics Concern   Not on file  Social History Narrative   Regular exercise: walks Caffeine use:    Social Determinants of Health   Financial Resource Strain: Not on file  Food Insecurity: No Food Insecurity   Worried About Charity fundraiser in the Last Year: Never true   [place] in the Last Year: Never true  Transportation Needs: No Transportation Needs   Lack of Transportation (Medical): No   Lack of Transportation (Non-Medical): No  Physical Activity: Not on file  Stress: Not on file  Social Connections: Not on file    Family History  Problem Relation Age of Onset   Hypertension Mother    Cancer Mother    Diabetes Mother    Stroke Mother    Diabetes Father    Cancer Paternal Barbaraann Rondo  Depression Paternal Grandmother    Depression Paternal Grandfather    Heart disease Other    Cancer Other    Diabetes Other    Achalasia Other    Anesthesia problems Neg Hx    Hypotension Neg Hx    Malignant hyperthermia Neg Hx    Pseudochol deficiency Neg Hx     Outpatient Encounter Medications as of 01/01/2022  Medication Sig   buPROPion (WELLBUTRIN XL) 150 MG 24 hr tablet Take 150 mg by mouth daily.   furosemide (LASIX) 20 MG tablet Take 20 mg by mouth daily. Take 1-2 tablets daily   oxybutynin (DITROPAN-XL) 5 MG 24 hr tablet Take 5 mg by mouth at bedtime.   albuterol (VENTOLIN HFA) 108 (90 Base) MCG/ACT inhaler Inhale 2 puffs into the lungs every 6 (six) hours as needed for wheezing or shortness of breath.    blood glucose meter kit and supplies Use up to four times daily as directed.   cyclobenzaprine (FLEXERIL) 10 MG tablet Take 1 tablet (10 mg total) by mouth 3 (three) times daily as needed for muscle spasms.   gabapentin (NEURONTIN) 600 MG tablet Take 1 tablet (600 mg total) by mouth 3 (three) times daily.   glucose blood test strip Test up to 4 times a day   insulin  lispro (HUMALOG) 100 UNIT/ML injection Inject 80 units under the skin via omnipod daily   Lancets (FREESTYLE) lancets Use to test 4 times daily   lisinopril-hydrochlorothiazide (ZESTORETIC) 20-12.5 MG tablet Take 1 tablet by mouth daily.   ondansetron (ZOFRAN) 4 MG tablet Take 1 tablet (4 mg total) by mouth every 8 (eight) hours as needed for nausea or vomiting.   oxyCODONE (OXY IR/ROXICODONE) 5 MG immediate release tablet Take 1 tablet (5 mg total) by mouth every 4 (four) hours as needed (pain).   potassium chloride SA (KLOR-CON M15) 15 MEQ tablet Take 15 mEq by mouth 2 (two) times daily.   simvastatin (ZOCOR) 40 MG tablet Take 40 mg by mouth daily.    tirzepatide [facility]) 5 MG/0.5ML Pen Inject 5 mg into the skin once a week. Monday   No facility-administered encounter medications on file as of 01/01/2022.    ALLERGIES: Allergies  Allergen Reactions   Ciprofloxacin Hives and Swelling   Penicillins Anaphylaxis and Rash   Codeine Other (See Comments)    had hallicunations   Morphine Nausea And Vomiting and Other (See Comments)    recieved in ED due to [facility] and had multple doses - gave visual hallucinations and vomitting.   Sulfonamide Derivatives Other (See Comments)    REACTION: Unsure - childhood allergy   Vancomycin Itching    VACCINATION STATUS: Immunization History  Administered Date(s) Administered   H1N1 09/29/2008   Influenza Split 08/25/2012   Influenza Whole 08/31/2008   Influenza,inj,Quad PF,6+ Mos 10/19/2013, 08/30/2021   Moderna Covid-19 Vaccine Bivalent Booster 44yr & up 09/09/2021   Moderna Sars-Covid-2 Vaccination 01/05/2020, 02/02/2020   Pneumococcal Polysaccharide-23 09/14/2008, 02/21/2014   Tdap 06/28/2020    Diabetes She presents for her initial diabetic visit. She has type 2 diabetes mellitus. Her disease course has been improving. Pertinent negatives for hypoglycemia include no confusion, headaches, pallor or seizures. Associated symptoms include  fatigue and polydipsia. Pertinent negatives for diabetes include no chest pain, no polyphagia and no polyuria. Symptoms are improving. There are no diabetic complications. Risk factors for coronary artery disease include dyslipidemia, diabetes mellitus, obesity, sedentary lifestyle and hypertension. Current diabetic treatment includes insulin pump. She is following a diabetic  and generally unhealthy diet. (She has a meter which shows no readings in the last month.  She says she was not mostly in the hospital.  Her most recent A1c was 5.7%, overall improving from 10.2%.  She wears insulin pump with 80 units of insulin daily. Settings reviewed 2.4 units/h for better, 1 unit for 10 g of carbs, ISF 80, AIT 4 hours.  Glucose target of 120 using tandem slim insulin pump.)  Hyperlipidemia This is a chronic problem. Exacerbating diseases include diabetes. Pertinent negatives include no chest pain, myalgias or shortness of breath. Current antihyperlipidemic treatment includes statins. Risk factors for coronary artery disease include diabetes mellitus, dyslipidemia, hypertension, obesity, family history and a sedentary lifestyle.  Hypertension This is a chronic problem. The current episode started more than 1 year ago. The problem is controlled. Pertinent negatives include no chest pain, headaches, palpitations or shortness of breath. Risk factors for coronary artery disease include dyslipidemia, diabetes mellitus, post-menopausal state, sedentary lifestyle, family history and obesity. Past treatments include ACE inhibitors and diuretics.    Review of Systems  Constitutional:  Positive for fatigue. Negative for chills, fever and unexpected weight change.  HENT:  Negative for trouble swallowing and voice change.   Eyes:  Negative for visual disturbance.  Respiratory:  Negative for cough, shortness of breath and wheezing.   Cardiovascular:  Negative for chest pain, palpitations and leg swelling.  Gastrointestinal:   Negative for diarrhea, nausea and vomiting.  Endocrine: Positive for polydipsia. Negative for cold intolerance, heat intolerance, polyphagia and polyuria.  Musculoskeletal:  Negative for arthralgias and myalgias.  Skin:  Negative for color change, pallor, rash and wound.  Neurological:  Negative for seizures and headaches.  Psychiatric/Behavioral:  Negative for confusion and suicidal ideas.    Objective:    Vitals with BMI 01/01/2022 12/15/2021 12/15/2021  Height 4' 9"  - -  Weight 198 lbs 6 oz - -  BMI 85.46 - -  Systolic 270 350 -  Diastolic 68 48 -  Pulse 92 88 77    BP 116/68    Pulse 92    Ht 4' 9"  (1.448 m)    Wt 198 lb 6.4 oz (90 kg)    LMP 07/16/2012    BMI 42.93 kg/m   Wt Readings from Last 3 Encounters:  01/01/22 198 lb 6.4 oz (90 kg)  12/14/21 202 lb (91.6 kg)  12/05/21 202 lb (91.6 kg)     Physical Exam Constitutional:      Appearance: She is well-developed.  HENT:     Head: Normocephalic and atraumatic.  Neck:     Thyroid: No thyromegaly.     Trachea: No tracheal deviation.  Cardiovascular:     Rate and Rhythm: Normal rate and regular rhythm.  Pulmonary:     Effort: Pulmonary effort is normal.  Abdominal:     Tenderness: There is no abdominal tenderness. There is no guarding.  Musculoskeletal:        General: Normal range of motion.     Cervical back: Normal range of motion and neck supple.     Comments: Patient uses a cane to ambulate due to her recent back surgery.  Skin:    General: Skin is warm and dry.     Coloration: Skin is not pale.     Findings: No erythema or rash.  Neurological:     Mental Status: She is alert and oriented to person, place, and time.     Cranial Nerves: No cranial nerve deficit.  Sensory: No sensory deficit.     Coordination: Coordination normal.     Deep Tendon Reflexes: Reflexes are normal and symmetric.  Psychiatric:        Judgment: Judgment normal.    CMP ( most recent) CMP     Component Value Date/Time   NA 135  12/13/2021 2247   NA 144 11/30/2021 1415   K 3.9 12/13/2021 2247   CL 97 (L) 12/13/2021 2247   CO2 29 12/13/2021 2247   GLUCOSE 101 (H) 12/13/2021 2247   BUN 14 12/13/2021 2247   BUN 8 11/30/2021 1415   CREATININE 0.64 12/13/2021 2247   CREATININE 0.89 05/25/2020 1626   CALCIUM 9.1 12/13/2021 2247   PROT 6.9 11/30/2021 1415   ALBUMIN 4.4 11/30/2021 1415   AST 26 11/30/2021 1415   ALT 19 11/30/2021 1415   ALKPHOS 128 (H) 11/30/2021 1415   BILITOT 0.8 11/30/2021 1415   GFRNONAA >60 12/13/2021 2247   GFRAA >60 08/05/2020 1248     Diabetic Labs (most recent): Lab Results  Component Value Date   HGBA1C 5.7 (H) 11/30/2021   HGBA1C 10.2 (H) 08/30/2021   HGBA1C 7.0 (H) 03/15/2021     Lipid Panel ( most recent) Lipid Panel     Component Value Date/Time   CHOL 117 11/30/2021 1415   TRIG 84 11/30/2021 1415   HDL 47 11/30/2021 1415   CHOLHDL 4.0 07/20/2013 1100   VLDL 48 (H) 07/20/2013 1100   LDLCALC 53 11/30/2021 1415   LABVLDL 17 11/30/2021 1415      Lab Results  Component Value Date   TSH 1.438 11/18/2012   TSH 4.326 09/01/2008           Assessment & Plan:   Type 2 diabetes, unspecified complication using insulin pump  - [name] has currently uncontrolled symptomatic type 2 DM since  56 years of age,  with most recent A1c of 5.7% improving from 10.2% from the prior measurement.  Recent labs reviewed.  She has a meter which shows no readings in the last month.  She says she was not mostly in the hospital.    She wears insulin pump with 80 units of insulin daily. Settings reviewed 2.4 units/h for better, 1 unit for 10 g of carbs, ISF 80, AIT 4 hours.  Glucose target of 120 using tandem slim insulin pump. - I had a long discussion with her about the progressive nature of diabetes and the pathology behind its complications.  She does not report gross complications from her diabetes, however she remains at a high risk for more acute and chronic complications  which include CAD, CVA, CKD, retinopathy, and neuropathy. These are all discussed in detail with her.  - I discussed all available options of managing her diabetes including de-escalation of medications. I have counseled her on diet  and weight management  by adopting a Whole Food , Plant Predominant  ( WFPP) nutrition as recommended by [employer] of Lifestyle Medicine. Patient is encouraged to switch to  unprocessed or minimally processed  complex starch, adequate protein intake (mainly plant source), minimal liquid fat ( mainly vegetable oils), plenty of fruits, and vegetables. -  she is advised to stick to a routine mealtimes to eat 3 complete meals a day and snack only when necessary ( to snack only to correct hypoglycemia BG <70 day time or <100 at night).   - she acknowledges that there is a room for improvement in her food and drink choices. -  Further Specific Suggestion is made for her to avoid simple carbohydrates  from her diet including Cakes, Sweet Desserts, Ice Cream, Soda (diet and regular), Sweet Tea, Candies, Chips, Cookies, Store Bought Juices, Alcohol ,  Artificial Sweeteners,  Coffee Creamer, and "Sugar-free" Products. This will help patient to have more stable blood glucose profile and potentially avoid unintended weight gain.  The following Lifestyle Medicine recommendations according to [facility]) were discussed and offered to patient and she agrees to start the journey:  A. Whole Foods, Plant-based plate comprising of fruits and vegetables, plant-based proteins, whole-grain carbohydrates was discussed in detail with the patient.   A list for source of those nutrients were also provided to the patient.  Patient will use only water or unsweetened tea for hydration. B.  The need to stay away from risky substances including alcohol, smoking; obtaining 7 to 9 hours of restorative sleep, at least 150 minutes of moderate intensity exercise weekly,  the importance of healthy social connections,  and stress reduction techniques were discussed. C.  A full color page of  Calorie density of various food groups per pound showing examples of each food groups was provided to the patient.  - she will be scheduled with Jearld Fenton, RDN, CDE for individualized diabetes education.  - I have approached her with the following plan to manage  her diabetes and patient agrees:  -Patient would like to consider coming off of insulin/insulin pump.  In order to rule out, she would be considered for lower insulin dose.  Her insulin pump settings were reviewed and adjusted: Basal 1.2 units/h, bolus 1 unit for 15 g of carbs, continue ISF at 80, glucose target at 120, AIT at 4 hours. -She is advised to monitor blood glucose 4 times a day-daily before meals and at bedtime. - she is encouraged to call clinic for blood glucose levels less than 70 or above 300 mg /dl.  - she will be considered for non-insulin treatment and incretin therapy as appropriate next visit. -This patient may need work-up with antipancreatic antibodies to classify her diabetes properly.    - Specific targets for  A1c;  LDL, HDL,  and Triglycerides were discussed with the patient.  2) Blood Pressure /Hypertension:  her blood pressure is  controlled to target.   she is advised to continue her current medications including lisinopril/HCTZ 20-12 0.5 mg p.o. daily with breakfast .  3) Lipids/Hyperlipidemia:   Review of her recent lipid panel showed  controlled  LDL at 53 .  she  is advised to continue   simvastatin 40 mg daily at bedtime.  Side effects and precautions discussed with her.  4)  Weight/Diet:  Body mass index is 42.93 kg/m.  -   clearly complicating her diabetes care.   she is  a candidate for weight loss. I discussed with her the fact that loss of 5 - 10% of her  current body weight will have the most impact on her diabetes management.  The above detailed  [facility] recommendations for  nutrition, exercise, sleep, social life, avoidance of risky substances, the need for restorative sleep   information will also detailed on discharge instructions.  5) Chronic Care/Health Maintenance:  -she  is on ACEI/ARB and Statin medications and  is encouraged to initiate and continue to follow up with Ophthalmology, Dentist,  Podiatrist at least yearly or according to recommendations, and advised to   stay away from smoking. I have recommended yearly flu vaccine and pneumonia  vaccine at least every 5 years; moderate intensity exercise for up to 150 minutes weekly; and  sleep for 7- 9 hours a day.  - she is  advised to maintain close follow up with Lindell Spar, MD for primary care needs, as well as her other providers for optimal and coordinated care.   I spent 66 minutes in the care of the patient today including review of labs from [place], Lipids, Thyroid Function, Hematology (current and previous including abstractions from other facilities); face-to-face time discussing  her blood glucose readings/logs, discussing hypoglycemia and hyperglycemia episodes and symptoms, medications doses, her options of short and long term treatment based on the latest standards of care / guidelines;  discussion about incorporating lifestyle medicine;  and documenting the encounter.     Please refer to Patient Instructions for Blood Glucose Monitoring and Insulin/Medications Dosing Guide"  in media tab for additional information. Please  also refer to " Patient Self Inventory" in the Media  tab for reviewed elements of pertinent patient history.  Evon Slack participated in the discussions, expressed understanding, and voiced agreement with the above plans.  All questions were answered to her satisfaction. she is encouraged to contact clinic should she have any questions or concerns prior to her return visit.   Follow up plan: - Return in about 9 weeks (around 03/05/2022) for Bring Meter and Logs- A1c in  Office.  Glade Lloyd, MD [facility] [address] [place], [place] [address] Phone: [phone]  Fax: [phone]    01/01/2022, 6:12 PM  This note was partially dictated with voice recognition software. Similar sounding words can be transcribed inadequately or may not  be corrected upon review.

## 2022-01-02 ENCOUNTER — Ambulatory Visit (HOSPITAL_COMMUNITY)
Admission: RE | Admit: 2022-01-02 | Discharge: 2022-01-02 | Disposition: A | Discharge status: Home / Self Care | Payer: No Typology Code available for payment source | Source: Ambulatory Visit | Attending: Urology | Admitting: Urology

## 2022-01-02 DIAGNOSIS — N200 Calculus of kidney: Secondary | ICD-10-CM | POA: Diagnosis present

## 2022-01-04 ENCOUNTER — Encounter: Payer: Self-pay | Admitting: *Deleted

## 2022-01-04 ENCOUNTER — Other Ambulatory Visit (HOSPITAL_COMMUNITY): Payer: Self-pay

## 2022-01-04 ENCOUNTER — Ambulatory Visit (INDEPENDENT_AMBULATORY_CARE_PROVIDER_SITE_OTHER): Payer: No Typology Code available for payment source | Admitting: Internal Medicine

## 2022-01-04 ENCOUNTER — Encounter: Payer: Self-pay | Admitting: Internal Medicine

## 2022-01-04 ENCOUNTER — Other Ambulatory Visit: Payer: Self-pay

## 2022-01-04 VITALS — BP 118/64 | HR 98 | Resp 18 | Ht <= 58 in | Wt 196.0 lb

## 2022-01-04 DIAGNOSIS — Z794 Long term (current) use of insulin: Secondary | ICD-10-CM

## 2022-01-04 DIAGNOSIS — I10 Essential (primary) hypertension: Secondary | ICD-10-CM

## 2022-01-04 DIAGNOSIS — Z9289 Personal history of other medical treatment: Secondary | ICD-10-CM

## 2022-01-04 DIAGNOSIS — M48062 Spinal stenosis, lumbar region with neurogenic claudication: Secondary | ICD-10-CM

## 2022-01-04 DIAGNOSIS — R110 Nausea: Secondary | ICD-10-CM

## 2022-01-04 DIAGNOSIS — E1169 Type 2 diabetes mellitus with other specified complication: Secondary | ICD-10-CM | POA: Diagnosis not present

## 2022-01-04 MED ORDER — ONDANSETRON HCL 4 MG PO TABS
4.0000 mg | ORAL_TABLET | Freq: Three times a day (TID) | ORAL | 0 refills | Status: DC | PRN
  Filled 2022-01-04: qty 20, 7d supply, fill #0

## 2022-01-04 NOTE — Patient Instructions (Signed)
Please continue taking medications as prescribed.  Please continue to follow low carb diet and ambulate as tolerated.  Please bring your home medications in the next visit.

## 2022-01-05 ENCOUNTER — Ambulatory Visit (INDEPENDENT_AMBULATORY_CARE_PROVIDER_SITE_OTHER): Payer: No Typology Code available for payment source | Admitting: Urology

## 2022-01-05 ENCOUNTER — Encounter: Payer: Self-pay | Admitting: Urology

## 2022-01-05 ENCOUNTER — Other Ambulatory Visit (HOSPITAL_COMMUNITY): Payer: Self-pay

## 2022-01-05 VITALS — BP 125/75 | HR 89

## 2022-01-05 DIAGNOSIS — N200 Calculus of kidney: Secondary | ICD-10-CM | POA: Diagnosis not present

## 2022-01-05 LAB — URINALYSIS, ROUTINE W REFLEX MICROSCOPIC
Bilirubin, UA: NEGATIVE
Glucose, UA: NEGATIVE
Ketones, UA: NEGATIVE
Nitrite, UA: NEGATIVE
Protein,UA: NEGATIVE
RBC, UA: NEGATIVE
Specific Gravity, UA: 1.015 (ref 1.005–1.030)
Urobilinogen, Ur: 4 mg/dL — ABNORMAL HIGH (ref 0.2–1.0)
pH, UA: 6 (ref 5.0–7.5)

## 2022-01-05 LAB — MICROSCOPIC EXAMINATION
RBC, Urine: NONE SEEN /hpf (ref 0–2)
Renal Epithel, UA: NONE SEEN /hpf

## 2022-01-05 MED ORDER — POTASSIUM CITRATE ER 15 MEQ (1620 MG) PO TBCR: 1.0000 | EXTENDED_RELEASE_TABLET | Freq: Two times a day (BID) | ORAL | 11 refills | Status: DC

## 2022-01-05 NOTE — Patient Instructions (Signed)
Dietary Guidelines to Help Prevent Kidney Stones Kidney stones are deposits of minerals and salts that form inside your kidneys. Your risk of developing kidney stones may be greater depending on your diet, your lifestyle, the medicines you take, and whether you have certain medical conditions. Most people can lower their chances of developing kidney stones by following the instructions below. Your dietitian may give you more specific instructions depending on your overall health and the type of kidney stones you tend to develop. What are tips for following this plan? Reading food labels  Choose foods with "no salt added" or "low-salt" labels. Limit your salt (sodium) intake to less than 1,500 mg a day. Choose foods with calcium for each meal and snack. Try to eat about 300 mg of calcium at each meal. Foods that contain 200-500 mg of calcium a serving include: 8 oz (237 mL) of milk, calcium-fortifiednon-dairy milk, and calcium-fortifiedfruit juice. Calcium-fortified means that calcium has been added to these drinks. 8 oz (237 mL) of kefir, yogurt, and soy yogurt. 4 oz (114 g) of tofu. 1 oz (28 g) of cheese. 1 cup (150 g) of dried figs. 1 cup (91 g) of cooked broccoli. One 3 oz (85 g) can of sardines or mackerel. Most people need 1,000-1,500 mg of calcium a day. Talk to your dietitian about how much calcium is recommended for you. Shopping Buy plenty of fresh fruits and vegetables. Most people do not need to avoid fruits and vegetables, even if these foods contain nutrients that may contribute to kidney stones. When shopping for convenience foods, choose: Whole pieces of fruit. Pre-made salads with dressing on the side. Low-fat fruit and yogurt smoothies. Avoid buying frozen meals or prepared deli foods. These can be high in sodium. Look for foods with live cultures, such as yogurt and kefir. Choose high-fiber grains, such as whole-wheat breads, oat bran, and wheat cereals. Cooking Do not add  salt to food when cooking. Place a salt shaker on the table and allow each person to add his or her own salt to taste. Use vegetable protein, such as beans, textured vegetable protein (TVP), or tofu, instead of meat in pasta, casseroles, and soups. Meal planning Eat less salt, if told by your dietitian. To do this: Avoid eating processed or pre-made food. Avoid eating fast food. Eat less animal protein, including cheese, meat, poultry, or fish, if told by your dietitian. To do this: Limit the number of times you have meat, poultry, fish, or cheese each week. Eat a diet free of meat at least 2 days a week. Eat only one serving each day of meat, poultry, fish, or seafood. When you prepare animal protein, cut pieces into small portion sizes. For most meat and fish, one serving is about the size of the palm of your hand. Eat at least five servings of fresh fruits and vegetables each day. To do this: Keep fruits and vegetables on hand for snacks. Eat one piece of fruit or a handful of berries with breakfast. Have a salad and fruit at lunch. Have two kinds of vegetables at dinner. Limit foods that are high in a substance called oxalate. These include: Spinach (cooked), rhubarb, beets, sweet potatoes, and Swiss chard. Peanuts. Potato chips, french fries, and baked potatoes with skin on. Nuts and nut products. Chocolate. If you regularly take a diuretic medicine, make sure to eat at least 1 or 2 servings of fruits or vegetables that are high in potassium each day. These include: Avocado. Banana. Orange, prune,   carrot, or tomato juice. Baked potato. Cabbage. Beans and split peas. Lifestyle  Drink enough fluid to keep your urine pale yellow. This is the most important thing you can do. Spread your fluid intake throughout the day. If you drink alcohol: Limit how much you use to: 0-1 drink a day for women who are not pregnant. 0-2 drinks a day for men. Be aware of how much alcohol is in your  drink. In the [place] one drink equals one 12 oz bottle of beer (355 mL), one 5 oz glass of wine (148 mL), or one 1 oz glass of hard liquor (44 mL). Lose weight if told by your health care provider. Work with your dietitian to find an eating plan and weight loss strategies that work best for you. General information Talk to your health care provider and dietitian about taking daily supplements. You may be told the following depending on your health and the cause of your kidney stones: Not to take supplements with vitamin C. To take a calcium supplement. To take a daily probiotic supplement. To take other supplements such as magnesium, fish oil, or vitamin B6. Take over-the-counter and prescription medicines only as told by your health care provider. These include supplements. What foods should I limit? Limit your intake of the following foods, or eat them as told by your dietitian. Vegetables Spinach. Rhubarb. Beets. Canned vegetables. Pickles. Olives. Baked potatoes with skin. Grains Wheat bran. Baked goods. Salted crackers. Cereals high in sugar. Meats and other proteins Nuts. Nut butters. Large portions of meat, poultry, or fish. Salted, precooked, or cured meats, such as sausages, meat loaves, and hot dogs. Dairy Cheese. Beverages Regular soft drinks. Regular vegetable juice. Seasonings and condiments Seasoning blends with salt. Salad dressings. Soy sauce. Ketchup. Barbecue sauce. Other foods Canned soups. Canned pasta sauce. Casseroles. Pizza. Lasagna. Frozen meals. Potato chips. French fries. The items listed above may not be a complete list of foods and beverages you should limit. Contact a dietitian for more information. What foods should I avoid? Talk to your dietitian about specific foods you should avoid based on the type of kidney stones you have and your overall health. Fruits Grapefruit. The item listed above may not be a complete list of foods and beverages you should  avoid. Contact a dietitian for more information. Summary Kidney stones are deposits of minerals and salts that form inside your kidneys. You can lower your risk of kidney stones by making changes to your diet. The most important thing you can do is drink enough fluid. Drink enough fluid to keep your urine pale yellow. Talk to your dietitian about how much calcium you should have each day, and eat less salt and animal protein as told by your dietitian. This information is not intended to replace advice given to you by your health care provider. Make sure you discuss any questions you have with your health care provider. Document Revised: 11/19/2019 Document Reviewed: 11/19/2019 Elsevier Patient Education  2022 [employer]  

## 2022-01-05 NOTE — Assessment & Plan Note (Signed)
BP Readings from Last 1 Encounters:  01/05/22 125/75   Well-controlled Counseled for compliance with the medications Advised DASH diet and moderate exercise/walking as tolerated

## 2022-01-05 NOTE — Assessment & Plan Note (Signed)
S/p L5-S1 lumbar body fusion, complicated by epidural hematoma - drained now On abx for local infection ppx

## 2022-01-05 NOTE — Progress Notes (Signed)
01/05/2022 9:18 AM   [name] February 17, 1966 [id number]  Referring provider: Celene Squibb, MD [place],  [place] [address]  Followup nephrolithiasis   HPI: [name] is a 56yo here for followup for nephrolithiasis. No stone events since last visit. No flank pain. She is on UrocitK 53mq BID. Renal [place]orea1/24/2023 shows no calculi. No other complaints today   PMH: Past Medical History:  Diagnosis Date   Diabetes mellitus    insulin pump 2013   Dysphagia 03/09/2020   Hypercholesteremia    Hypertension    Incarcerated umbilical hernia 48/82/8003  Kidney stone    Kidney stones 08/31/2008   Qualifier: Diagnosis of  By: FJonna MunroMD, Cornelius     Neuropathy    Obesity    Obstructive sleep apnea    Palpitations    Shortness of breath     Surgical History: Past Surgical History:  Procedure Laterality Date   BIOPSY  06/23/2020   Procedure: BIOPSY;  Surgeon: RDaneil Dolin MD;  [facility];  Service: Endoscopy;;   CARDIAC CATHETERIZATION  12/11/2012   normal coronary arteries   CESAREAN SECTION  1994;2000   [facility]   CHOLECYSTECTOMY  1992   COLONOSCOPY WITH PROPOFOL N/A 06/23/2020   large amount of stool in entire colon, prep inadequate.    CYSTOSCOPY W/ URETERAL STENT PLACEMENT  05/08/2012   Procedure: CYSTOSCOPY WITH RETROGRADE PYELOGRAM/URETERAL STENT PLACEMENT;  Surgeon: MMarissa Nestle MD;  Location: [facility];  Service: Urology;  Laterality: Right;   CYSTOSCOPY WITH RETROGRADE PYELOGRAM, URETEROSCOPY AND STENT PLACEMENT Right 05/04/2020   Procedure: CYSTOSCOPY WITH RIGHT RETROGRADE PYELOGRAM, RIGHT URETEROSCOPY AND RIGHT URETERAL STENT EXCHANGE;  Surgeon: MCleon Gustin MD;  Location: [facility];  Service: Urology;  Laterality: Right;   CYSTOSCOPY WITH RETROGRADE PYELOGRAM, URETEROSCOPY AND STENT PLACEMENT Left 08/08/2020   Procedure: CYSTOSCOPY WITH RETROGRADE PYELOGRAM, URETEROSCOPY WITH LASER  AND STENT PLACEMENT;  Surgeon: MCleon Gustin MD;  Location: [facility];  Service: Urology;  Laterality: Left;   CYSTOSCOPY WITH RETROGRADE PYELOGRAM, URETEROSCOPY AND STENT PLACEMENT Right 11/08/2020   Procedure: CYSTOSCOPY WITH RIGHT RETROGRADE PYELOGRAM, RIGHT URETEROSCOPY AND STENT PLACEMENT;  Surgeon: MCleon Gustin MD;  [facility];  Service: Urology;  Laterality: Right;   CYSTOSCOPY WITH RETROGRADE PYELOGRAM, URETEROSCOPY AND STENT PLACEMENT Left 12/27/2020   Procedure: CYSTOSCOPY WITH LEFT RETROGRADE PYELOGRAM, LEFT URETEROSCOPY AND LEFT URETERAL STENT PLACEMENT;  Surgeon: MCleon Gustin MD;  [facility];  Service: Urology;  Laterality: Left;   CYSTOSCOPY WITH RETROGRADE PYELOGRAM, URETEROSCOPY AND STENT PLACEMENT Bilateral 03/16/2021   Procedure: CYSTOSCOPY WITH BILATERAL  RETROGRADE PYELOGRAM, BILATERAL URETEROSCOPY AND STENT PLACEMENT ON THE LEFT;  Surgeon: MCleon Gustin MD;  [facility];  Service: Urology;  Laterality: Bilateral;   CYSTOSCOPY WITH STENT PLACEMENT Right 02/25/2020   Procedure: CYSTOSCOPY WITH RIGHT RETROGRADE RIGHT STENT PLACEMENT;  Surgeon: EFestus Aloe MD;  Location: [facility];  Service: Urology;  Laterality: Right;   ESOPHAGOGASTRODUODENOSCOPY (EGD) WITH PROPOFOL N/A 06/23/2020   Normal esophagus, multiple localized erosions were found in gastric antrum s/p biopsy, normal duodenum. Empiric dilation.Reactive gastropathy with erosions, negative H.pylori, no dysplasia.     EXTRACORPOREAL SHOCK WAVE LITHOTRIPSY Right 10/11/2020   Procedure: EXTRACORPOREAL SHOCK WAVE LITHOTRIPSY (ESWL);  Surgeon: MCleon Gustin MD;  Location: [facility];  Service: Urology;  Laterality: Right;   FOOT SURGERY Right March, 2013   [facility]-removal of bone spur   HOLMIUM LASER APPLICATION  54/91/7915  Procedure: HOLMIUM LASER LITHOTRIPSY RIGHT URETERAL CALCULUS;  Surgeon: Cleon Gustin, MD;  Location: [facility];  Service: Urology;;   HYSTEROSCOPY WITH THERMACHOICE  04/25/2012   Procedure:  HYSTEROSCOPY WITH THERMACHOICE;  Surgeon: Florian Buff, MD;  Location: [facility];  Service: Gynecology;  Laterality: N/A;  total therapy time= 11 minutes 42 seconds; 34 ml D5w  in and 34 ml D5w out; temp =87 degrees F   LEFT HEART CATHETERIZATION WITH CORONARY ANGIOGRAM N/A 12/11/2012   Procedure: LEFT HEART CATHETERIZATION WITH CORONARY ANGIOGRAM;  Surgeon: Lorretta Harp, MD;  Location: [facility];  Service: Cardiovascular;  Laterality: N/A;   LUMBAR WOUND DEBRIDEMENT N/A 12/14/2021   Procedure: LUMBAR HEMATOMA EVACUATION;  Surgeon: Judith Part, MD;  Location: [place];  Service: Neurosurgery;  Laterality: N/A;   MALONEY DILATION N/A 06/23/2020   Procedure: Venia Minks DILATION;  Surgeon: Daneil Dolin, MD;  [facility];  Service: Endoscopy;  Laterality: N/A;   Sinus sergery  1988   [place]   STONE EXTRACTION WITH BASKET Right 11/08/2020   Procedure: STONE EXTRACTION WITH BASKET;  Surgeon: Cleon Gustin, MD;  Location: [facility];  Service: Urology;  Laterality: Right;   TRANSFORAMINAL LUMBAR INTERBODY FUSION (TLIF) WITH PEDICLE SCREW FIXATION 1 LEVEL N/A 12/05/2021   Procedure: Open Lumbar five-Sacral one Laminectomy/Transforaminal Lumbar Interbody Fusion/Posterolateral instrumented fusion;  Surgeon: Judith Part, MD;  Location: [facility];  Service: Neurosurgery;  Laterality: N/A;  3C   UMBILICAL HERNIA REPAIR N/A 03/25/2014   Procedure: UMBILICAL HERNIA REPAIR;  Surgeon: Scherry Ran, MD;  Location: [facility];  Service: General;  Laterality: N/A;   uterine ablation      Home Medications:  Allergies as of 01/05/2022       Reactions   Ciprofloxacin Hives, Swelling   Penicillins Anaphylaxis, Rash   Codeine Other (See Comments)   had hallicunations   Morphine Nausea And Vomiting, Other (See Comments)   recieved in ED due to [facility] and had multple doses - gave visual hallucinations and vomitting.   Sulfonamide Derivatives Other (See Comments)   REACTION: Unsure -  childhood allergy   Vancomycin Itching        Medication List        Accurate as of January 05, 2022  9:18 AM. If you have any questions, ask your nurse or doctor.          STOP taking these medications    potassium chloride SA 15 MEQ tablet Commonly known as: KLOR-CON M15 Stopped by: Nicolette Bang, MD       TAKE these medications    albuterol 108 (90 Base) MCG/ACT inhaler Commonly known as: VENTOLIN HFA Inhale 2 puffs into the lungs every 6 (six) hours as needed for wheezing or shortness of breath.   blood glucose meter kit and supplies Use up to four times daily as directed.   buPROPion 150 MG 24 hr tablet Commonly known as: WELLBUTRIN XL Take 150 mg by mouth daily.   cyclobenzaprine 10 MG tablet Commonly known as: FLEXERIL Take 1 tablet (10 mg total) by mouth 3 (three) times daily as needed for muscle spasms.   freestyle lancets Use to test 4 times daily   furosemide 20 MG tablet Commonly known as: LASIX Take 20 mg by mouth daily. Take 1-2 tablets daily   gabapentin 600 MG tablet Commonly known as: NEURONTIN Take 1 tablet (600 mg total) by mouth 3 (three) times daily.   glucose blood test strip Test up to  4 times a day   HumaLOG 100 UNIT/ML injection Generic drug: insulin lispro Inject 80 units under the skin via omnipod daily   lisinopril-hydrochlorothiazide 20-12.5 MG tablet Commonly known as: ZESTORETIC Take 1 tablet by mouth daily.   Mounjaro 5 MG/0.5ML Pen Generic drug: tirzepatide Inject 5 mg into the skin once a week. Monday   ondansetron 4 MG tablet Commonly known as: Zofran Take 1 tablet (4 mg total) by mouth every 8 (eight) hours as needed for nausea or vomiting.   oxybutynin 5 MG 24 hr tablet Commonly known as: DITROPAN-XL Take 5 mg by mouth at bedtime.   oxyCODONE 5 MG immediate release tablet Commonly known as: Oxy IR/ROXICODONE Take 1 tablet (5 mg total) by mouth every 4 (four) hours as needed (pain).   Potassium  Citrate 15 MEQ (1620 MG) Tbcr Commonly known as: Urocit-K 15 Take 1 tablet by mouth in the morning and at bedtime. Started by: Nicolette Bang, MD   simvastatin 40 MG tablet Commonly known as: ZOCOR Take 40 mg by mouth daily.        Allergies:  Allergies  Allergen Reactions   Ciprofloxacin Hives and Swelling   Penicillins Anaphylaxis and Rash   Codeine Other (See Comments)    had hallicunations   Morphine Nausea And Vomiting and Other (See Comments)    recieved in ED due to [facility] and had multple doses - gave visual hallucinations and vomitting.   Sulfonamide Derivatives Other (See Comments)    REACTION: Unsure - childhood allergy   Vancomycin Itching    Family History: Family History  Problem Relation Age of Onset   Hypertension Mother    Cancer Mother    Diabetes Mother    Stroke Mother    Diabetes Father    Cancer Paternal Uncle    Depression Paternal Grandmother    Depression Paternal Grandfather    Heart disease Other    Cancer Other    Diabetes Other    Achalasia Other    Anesthesia problems Neg Hx    Hypotension Neg Hx    Malignant hyperthermia Neg Hx    Pseudochol deficiency Neg Hx     Social History:  reports that she has never smoked. She has never used smokeless tobacco. She reports that she does not drink alcohol and does not use drugs.  ROS: All other review of systems were reviewed and are negative except what is noted above in HPI  Physical Exam: BP 125/75    Pulse 89    LMP 07/16/2012   Constitutional:  Alert and oriented, No acute distress. HEENT: [place] AT, moist mucus membranes.  Trachea midline, no masses. Cardiovascular: No clubbing, cyanosis, or edema. Respiratory: Normal respiratory effort, no increased work of breathing. GI: Abdomen is soft, nontender, nondistended, no abdominal masses GU: No CVA tenderness.  Lymph: No cervical or inguinal lymphadenopathy. Skin: No rashes, bruises or suspicious lesions. Neurologic: Grossly  intact, no focal deficits, moving all 4 extremities. Psychiatric: Normal mood and affect.  Laboratory Data: Lab Results  Component Value Date   WBC 13.4 (H) 12/13/2021   HGB 10.8 (L) 12/13/2021   HCT 33.4 (L) 12/13/2021   MCV 86.1 12/13/2021   PLT 501 (H) 12/13/2021    Lab Results  Component Value Date   CREATININE 0.64 12/13/2021    No results found for: PSA  No results found for: TESTOSTERONE  Lab Results  Component Value Date   HGBA1C 5.7 (H) 11/30/2021    Urinalysis    Component Value  Date/Time   COLORURINE AMBER (A) 04/04/2020 0309   APPEARANCEUR Clear 05/10/2021 0835   LABSPEC 1.020 04/04/2020 0309   PHURINE 6.0 04/04/2020 0309   GLUCOSEU Negative 05/10/2021 0835   HGBUR LARGE (A) 04/04/2020 0309   HGBUR moderate 08/31/2008 0908   BILIRUBINUR Negative 05/10/2021 0835   KETONESUR NEGATIVE 04/04/2020 0309   PROTEINUR Negative 05/10/2021 0835   PROTEINUR 100 (A) 04/04/2020 0309   UROBILINOGEN 0.2 12/14/2020 0835   UROBILINOGEN 0.2 09/21/2014 1055   NITRITE Negative 05/10/2021 0835   NITRITE NEGATIVE 04/04/2020 0309   LEUKOCYTESUR Negative 05/10/2021 0835   LEUKOCYTESUR SMALL (A) 04/04/2020 0309    Lab Results  Component Value Date   LABMICR 23.0 11/30/2021   WBCUA 0-5 03/29/2021   LABEPIT 0-10 03/29/2021   BACTERIA None seen 03/29/2021    Pertinent Imaging: Renal US 01/01/2022: Images reviewed and discussed with the patient  Results for orders placed during the hospital encounter of 10/31/20  DG Abd 1 View  Narrative CLINICAL DATA:  Status post lithotripsy  EXAM: ABDOMEN - 1 VIEW  COMPARISON:  10/10/2020  FINDINGS: Scattered large and small bowel gas is noted. Scattered calculi are noted over the lower pole of the right kidney. The largest of these measures approximately 6 mm. No definitive ureteral stones are noted. Degenerative changes of lumbar spine are seen.  IMPRESSION: Persistent right lower pole renal stones. The largest of  these measures approximately 6 mm.   Electronically Signed By: Inez Catalina M.D. On: 10/31/2020 11:38  No results found for this or any previous visit.  No results found for this or any previous visit.  No results found for this or any previous visit.  Results for orders placed during the hospital encounter of 01/02/22  US RENAL  Narrative CLINICAL DATA:  History of kidney stones  EXAM: RENAL / URINARY TRACT ULTRASOUND COMPLETE  COMPARISON:  Ultrasound 09/21/2021  FINDINGS: Right Kidney:  Renal measurements: 11.6 x 4.9 x 6.9 cm = volume: 206 mL. Echogenicity within normal limits. No mass or hydronephrosis visualized. Possible scarring upper pole right kidney.  Left Kidney:  Renal measurements: 11.2 x 5.4 x 4.8 cm = volume: 152 mL. Echogenicity within normal limits. No mass or hydronephrosis visualized.  Bladder:  Appears normal for degree of bladder distention.  Other:  None.  IMPRESSION: Negative examination   Electronically Signed By: Donavan Foil M.D. On: 01/02/2022 21:36  No results found for this or any previous visit.  No results found for this or any previous visit.  Results for orders placed during the hospital encounter of 02/01/21  CT RENAL STONE STUDY  Narrative CLINICAL DATA:  Left upper quadrant and left flank pain with hematuria for 3 weeks.  EXAM: CT ABDOMEN AND PELVIS WITHOUT CONTRAST  TECHNIQUE: Multidetector CT imaging of the abdomen and pelvis was performed following the standard protocol without IV contrast.  COMPARISON:  10/31/2020 CT abdomen/pelvis.  FINDINGS: Lower chest: No significant pulmonary nodules or acute consolidative airspace disease.  Hepatobiliary: Normal liver size. No liver mass. Cholecystectomy. No biliary ductal dilatation.  Pancreas: Normal, with no mass or duct dilation.  Spleen: Normal size. No mass.  Adrenals/Urinary Tract: Left adrenal 1.1 cm nodule with density 17 HU, stable since  04/04/2020 CT, compatible with an adenoma. Normal right adrenal. Nonobstructing 6 mm and 1 mm lower right renal stones. Multiple (at least 6) nonobstructing left renal stones, largest 4 mm in the interpolar left kidney. Mild fullness of the right renal collecting system without overt hydronephrosis, unchanged  from prior CT. No left hydronephrosis. No contour deforming renal masses. Normal caliber ureters. No ureteral stones. Normal bladder.  Stomach/Bowel: Normal non-distended stomach. Normal caliber small bowel with no small bowel wall thickening. Normal appendix. Normal large bowel with no diverticulosis, large bowel wall thickening or pericolonic fat stranding.  Vascular/Lymphatic: Mildly atherosclerotic nonaneurysmal abdominal aorta. No pathologically enlarged lymph nodes in the abdomen or pelvis.  Reproductive: Grossly normal uterus.  No adnexal mass.  Other: No pneumoperitoneum, ascites or focal fluid collection.  Musculoskeletal: No aggressive appearing focal osseous lesions. Chronic bilateral L5 pars defects. Moderate thoracolumbar spondylosis.  IMPRESSION: 1. Nonobstructing bilateral nephrolithiasis. No overt hydronephrosis. No ureteral or bladder stones. 2. Stable left adrenal adenoma. 3. Chronic bilateral L5 pars defects. 4. Aortic Atherosclerosis (ICD10-I70.0).   Electronically Signed By: Ilona Sorrel M.D. On: 02/01/2021 13:06   Assessment & Plan:    1. Nephrolithiasis -continue urocitK 3mq BID -RTC 6 months with a renal [place]- Urinalysis, Routine w reflex microscopic   Return in about 6 months (around 07/05/2022) for renal [place]  PNicolette Bang MD  [facility] [place]

## 2022-01-05 NOTE — Progress Notes (Signed)
Established Patient Office Visit  Subjective:  Patient ID: [name], female    DOB: 1966-03-18  Age: 56 y.o. MRN: [id number]  CC:  Chief Complaint  Patient presents with   Follow-up    2 week follow up pt just had back surgery she is not sleeping well also was taken off of bp pill but blood pressure still dropping     HPI [name] is a 56 y.o. female with past medical history of DM, HTN, OSA, lumbar spinal stenosis and insomnia who presents for f/u of her chronic medical conditions.  She recently had L5-S1 lumbar body fusion.  She had lumbar hematoma after it, which has been drained.  She was having discharge from the site and was placed on antibiotic for it.  She denies any fever or chills currently.  She still has serous discharge from the area.  DM: Her insulin pump was adjusted by Dr. Fransico Him recently.  She is going to get blood test for determining if she has type I or II DM.  She has responded well to [facility] in the past, which suggest most likely type II DM.  Past Medical History:  Diagnosis Date   Diabetes mellitus    insulin pump 2013   Dysphagia 03/09/2020   Hypercholesteremia    Hypertension    Incarcerated umbilical hernia 03/25/2014   Kidney stone    Kidney stones 08/31/2008   Qualifier: Diagnosis of  By: Erby Pian MD, Cornelius     Neuropathy    Obesity    Obstructive sleep apnea    Palpitations    Shortness of breath     Past Surgical History:  Procedure Laterality Date   BIOPSY  06/23/2020   Procedure: BIOPSY;  Surgeon: Corbin Ade, MD;  [facility];  Service: Endoscopy;;   CARDIAC CATHETERIZATION  12/11/2012   normal coronary arteries   CESAREAN SECTION  1994;2000   [facility]   CHOLECYSTECTOMY  1992   COLONOSCOPY WITH PROPOFOL N/A 06/23/2020   large amount of stool in entire colon, prep inadequate.    CYSTOSCOPY W/ URETERAL STENT PLACEMENT  05/08/2012   Procedure: CYSTOSCOPY WITH RETROGRADE PYELOGRAM/URETERAL STENT PLACEMENT;  Surgeon:  Ky Barban, MD;  Location: [facility];  Service: Urology;  Laterality: Right;   CYSTOSCOPY WITH RETROGRADE PYELOGRAM, URETEROSCOPY AND STENT PLACEMENT Right 05/04/2020   Procedure: CYSTOSCOPY WITH RIGHT RETROGRADE PYELOGRAM, RIGHT URETEROSCOPY AND RIGHT URETERAL STENT EXCHANGE;  Surgeon: Malen Gauze, MD;  Location: [facility];  Service: Urology;  Laterality: Right;   CYSTOSCOPY WITH RETROGRADE PYELOGRAM, URETEROSCOPY AND STENT PLACEMENT Left 08/08/2020   Procedure: CYSTOSCOPY WITH RETROGRADE PYELOGRAM, URETEROSCOPY WITH LASER  AND STENT PLACEMENT;  Surgeon: Malen Gauze, MD;  Location: [facility];  Service: Urology;  Laterality: Left;   CYSTOSCOPY WITH RETROGRADE PYELOGRAM, URETEROSCOPY AND STENT PLACEMENT Right 11/08/2020   Procedure: CYSTOSCOPY WITH RIGHT RETROGRADE PYELOGRAM, RIGHT URETEROSCOPY AND STENT PLACEMENT;  Surgeon: Malen Gauze, MD;  Location: [facility];  Service: Urology;  Laterality: Right;   CYSTOSCOPY WITH RETROGRADE PYELOGRAM, URETEROSCOPY AND STENT PLACEMENT Left 12/27/2020   Procedure: CYSTOSCOPY WITH LEFT RETROGRADE PYELOGRAM, LEFT URETEROSCOPY AND LEFT URETERAL STENT PLACEMENT;  Surgeon: Malen Gauze, MD;  Location: [facility];  Service: Urology;  Laterality: Left;   CYSTOSCOPY WITH RETROGRADE PYELOGRAM, URETEROSCOPY AND STENT PLACEMENT Bilateral 03/16/2021   Procedure: CYSTOSCOPY WITH BILATERAL  RETROGRADE PYELOGRAM, BILATERAL URETEROSCOPY AND STENT PLACEMENT ON THE LEFT;  Surgeon: Malen Gauze, MD;  Location: [facility];  Service: Urology;  Laterality: Bilateral;   CYSTOSCOPY WITH STENT PLACEMENT Right 02/25/2020   Procedure: CYSTOSCOPY WITH RIGHT RETROGRADE RIGHT STENT PLACEMENT;  Surgeon: Festus Aloe, MD;  Location: [facility];  Service: Urology;  Laterality: Right;   ESOPHAGOGASTRODUODENOSCOPY (EGD) WITH PROPOFOL N/A 06/23/2020   Normal esophagus, multiple localized erosions were found in gastric antrum s/p biopsy, normal duodenum. Empiric dilation.Reactive  gastropathy with erosions, negative H.pylori, no dysplasia.     EXTRACORPOREAL SHOCK WAVE LITHOTRIPSY Right 10/11/2020   Procedure: EXTRACORPOREAL SHOCK WAVE LITHOTRIPSY (ESWL);  Surgeon: Cleon Gustin, MD;  Location: [facility];  Service: Urology;  Laterality: Right;   FOOT SURGERY Right March, 2013   [facility]-removal of bone spur   HOLMIUM LASER APPLICATION  2/95/6213   Procedure: HOLMIUM LASER LITHOTRIPSY RIGHT URETERAL CALCULUS;  Surgeon: Cleon Gustin, MD;  Location: [facility];  Service: Urology;;   HYSTEROSCOPY WITH THERMACHOICE  04/25/2012   Procedure: HYSTEROSCOPY WITH THERMACHOICE;  Surgeon: Florian Buff, MD;  Location: [facility];  Service: Gynecology;  Laterality: N/A;  total therapy time= 11 minutes 42 seconds; 34 ml D5w  in and 34 ml D5w out; temp =87 degrees F   LEFT HEART CATHETERIZATION WITH CORONARY ANGIOGRAM N/A 12/11/2012   Procedure: LEFT HEART CATHETERIZATION WITH CORONARY ANGIOGRAM;  Surgeon: Lorretta Harp, MD;  [facility];  Service: Cardiovascular;  Laterality: N/A;   LUMBAR WOUND DEBRIDEMENT N/A 12/14/2021   Procedure: LUMBAR HEMATOMA EVACUATION;  Surgeon: Judith Part, MD;  Location: [place];  Service: Neurosurgery;  Laterality: N/A;   MALONEY DILATION N/A 06/23/2020   Procedure: Venia Minks DILATION;  Surgeon: Daneil Dolin, MD;  [facility];  Service: Endoscopy;  Laterality: N/A;   Sinus sergery  1988   [place]   STONE EXTRACTION WITH BASKET Right 11/08/2020   Procedure: STONE EXTRACTION WITH BASKET;  Surgeon: Cleon Gustin, MD;  Location: [facility];  Service: Urology;  Laterality: Right;   TRANSFORAMINAL LUMBAR INTERBODY FUSION (TLIF) WITH PEDICLE SCREW FIXATION 1 LEVEL N/A 12/05/2021   Procedure: Open Lumbar five-Sacral one Laminectomy/Transforaminal Lumbar Interbody Fusion/Posterolateral instrumented fusion;  Surgeon: Judith Part, MD;  Location: [place];  Service: Neurosurgery;  Laterality: N/A;  3C   UMBILICAL HERNIA  REPAIR N/A 03/25/2014   Procedure: UMBILICAL HERNIA REPAIR;  Surgeon: Scherry Ran, MD;  Location: [facility];  Service: General;  Laterality: N/A;   uterine ablation      Family History  Problem Relation Age of Onset   Hypertension Mother    Cancer Mother    Diabetes Mother    Stroke Mother    Diabetes Father    Cancer Paternal Uncle    Depression Paternal Grandmother    Depression Paternal Grandfather    Heart disease Other    Cancer Other    Diabetes Other    Achalasia Other    Anesthesia problems Neg Hx    Hypotension Neg Hx    Malignant hyperthermia Neg Hx    Pseudochol deficiency Neg Hx     Social History   Socioeconomic History   Marital status: Married    Spouse name: Not on file   Number of children: 2   Years of education: college   Highest education level: Not on file  Occupational History   Occupation: CNA    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: [employer]   Occupation: Quarry manager- at [employer]  Tobacco Use   Smoking status: Never   Smokeless tobacco: Never  Vaping Use   Vaping Use: Never used  Substance and Sexual Activity   Alcohol use: No   Drug use: No   Sexual activity: Yes    Partners: Male    Comment: ablation  Other Topics Concern   Not on file  Social History Narrative   Regular exercise: walks Caffeine use:    Social Determinants of Health   Financial Resource Strain: Not on file  Food Insecurity: No Food Insecurity   Worried About Charity fundraiser in the Last Year: Never true   Ran Out of Food in the Last Year: Never true  Transportation Needs: No Transportation Needs   Lack of Transportation (Medical): No   Lack of Transportation (Non-Medical): No  Physical Activity: Not on file  Stress: Not on file  Social Connections: Not on file  Intimate Partner Violence: Not on file    Outpatient Medications Prior to Visit  Medication Sig Dispense Refill   albuterol (VENTOLIN HFA) 108 (90 Base) MCG/ACT inhaler Inhale 2  puffs into the lungs every 6 (six) hours as needed for wheezing or shortness of breath.      blood glucose meter kit and supplies Use up to four times daily as directed. 1 each 0   buPROPion (WELLBUTRIN XL) 150 MG 24 hr tablet Take 150 mg by mouth daily.     cyclobenzaprine (FLEXERIL) 10 MG tablet Take 1 tablet (10 mg total) by mouth 3 (three) times daily as needed for muscle spasms. 30 tablet 0   furosemide (LASIX) 20 MG tablet Take 20 mg by mouth daily. Take 1-2 tablets daily     gabapentin (NEURONTIN) 600 MG tablet Take 1 tablet (600 mg total) by mouth 3 (three) times daily. 90 tablet 2   glucose blood test strip Test up to 4 times a day 100 each 0   insulin lispro (HUMALOG) 100 UNIT/ML injection Inject 80 units under the skin via omnipod daily 40 mL 0   Lancets (FREESTYLE) lancets Use to test 4 times daily 100 each 0   oxybutynin (DITROPAN-XL) 5 MG 24 hr tablet Take 5 mg by mouth at bedtime.     oxyCODONE (OXY IR/ROXICODONE) 5 MG immediate release tablet Take 1 tablet (5 mg total) by mouth every 4 (four) hours as needed (pain). 30 tablet 0   simvastatin (ZOCOR) 40 MG tablet Take 40 mg by mouth daily.      tirzepatide [facility]) 5 MG/0.5ML Pen Inject 5 mg into the skin once a week. Monday     ondansetron (ZOFRAN) 4 MG tablet Take 1 tablet (4 mg total) by mouth every 8 (eight) hours as needed for nausea or vomiting. 20 tablet 0   potassium chloride SA (KLOR-CON M15) 15 MEQ tablet Take 15 mEq by mouth 2 (two) times daily.     lisinopril-hydrochlorothiazide (ZESTORETIC) 20-12.5 MG tablet Take 1 tablet by mouth daily. (Patient not taking: Reported on 01/04/2022)     No facility-administered medications prior to visit.    Allergies  Allergen Reactions   Ciprofloxacin Hives and Swelling   Penicillins Anaphylaxis and Rash   Codeine Other (See Comments)    had hallicunations   Morphine Nausea And Vomiting and Other (See Comments)    recieved in ED due to [facility] and had multple doses - gave  visual hallucinations and vomitting.   Sulfonamide Derivatives Other (See Comments)    REACTION: Unsure - childhood allergy   Vancomycin Itching    ROS Review of Systems  Constitutional:  Negative for chills  and fever.  HENT:  Negative for congestion, sinus pressure, sinus pain and sore throat.   Eyes:  Negative for pain and discharge.  Respiratory:  Negative for cough and shortness of breath.   Cardiovascular:  Negative for chest pain and palpitations.  Gastrointestinal:  Negative for abdominal pain, diarrhea, nausea and vomiting.  Endocrine: Negative for polydipsia and polyuria.  Genitourinary:  Negative for dysuria and hematuria.  Musculoskeletal:  Positive for back pain. Negative for neck pain and neck stiffness.  Skin:  Positive for wound.  Neurological:  Negative for dizziness and weakness.  Psychiatric/Behavioral:  Negative for agitation and behavioral problems.      Objective:    Physical Exam Vitals reviewed.  Constitutional:      General: She is not in acute distress.    Appearance: She is obese. She is not diaphoretic.  HENT:     Head: Normocephalic and atraumatic.     Mouth/Throat:     Mouth: Mucous membranes are moist.     Pharynx: No posterior oropharyngeal erythema.  Eyes:     General: No scleral icterus.    Extraocular Movements: Extraocular movements intact.  Cardiovascular:     Rate and Rhythm: Normal rate and regular rhythm.     Pulses: Normal pulses.     Heart sounds: Normal heart sounds. No murmur heard. Pulmonary:     Breath sounds: Normal breath sounds. No wheezing or rales.  Musculoskeletal:     Cervical back: Neck supple. No tenderness.     Right lower leg: No edema.     Left lower leg: No edema.     Comments: S/p lumbar fusion surgery, dressing in place - serous discharge  Skin:    General: Skin is warm.     Findings: No rash.  Neurological:     General: No focal deficit present.     Mental Status: She is alert and oriented to person,  place, and time.  Psychiatric:        Mood and Affect: Mood normal.        Behavior: Behavior normal.    BP 118/64 (BP Location: Right Arm, Patient Position: Sitting, Cuff Size: Normal)    Pulse 98    Resp 18    Ht _0  (1.448 m)    Wt 196 lb (88.9 kg)    LMP 07/16/2012    SpO2 100%    BMI 42.41 kg/m  Wt Readings from Last 3 Encounters:  01/04/22 196 lb (88.9 kg)  01/01/22 198 lb 6.4 oz (90 kg)  12/14/21 202 lb (91.6 kg)    Lab Results  Component Value Date   TSH 1.438 11/18/2012   Lab Results  Component Value Date   WBC 13.4 (H) 12/13/2021   HGB 10.8 (L) 12/13/2021   HCT 33.4 (L) 12/13/2021   MCV 86.1 12/13/2021   PLT 501 (H) 12/13/2021   Lab Results  Component Value Date   NA 135 12/13/2021   K 3.9 12/13/2021   CO2 29 12/13/2021   GLUCOSE 101 (H) 12/13/2021   BUN 14 12/13/2021   CREATININE 0.64 12/13/2021   BILITOT 0.8 11/30/2021   ALKPHOS 128 (H) 11/30/2021   AST 26 11/30/2021   ALT 19 11/30/2021   PROT 6.9 11/30/2021   ALBUMIN 4.4 11/30/2021   CALCIUM 9.1 12/13/2021   ANIONGAP 9 12/13/2021   EGFR 104 11/30/2021   Lab Results  Component Value Date   CHOL 117 11/30/2021   Lab Results  Component Value Date   HDL  47 11/30/2021   Lab Results  Component Value Date   LDLCALC 53 11/30/2021   Lab Results  Component Value Date   TRIG 84 11/30/2021   Lab Results  Component Value Date   CHOLHDL 4.0 07/20/2013   Lab Results  Component Value Date   HGBA1C 5.7 (H) 11/30/2021      Assessment & Plan:   Problem List Items Addressed This Visit       Cardiovascular and Mediastinum   Essential hypertension, benign    BP Readings from Last 1 Encounters:  01/05/22 125/75  Well-controlled Counseled for compliance with the medications Advised DASH diet and moderate exercise/walking as tolerated        Endocrine   Diabetes mellitus ([place]) - Primary    Lab Results  Component Value Date   HGBA1C 5.7 (H) 11/30/2021  Has insulin pump, followed by Dr.  Dorris Fetch now Unclear whether she has type I or II DM, but she had responded well to [facility], most likely type II DM Advised to follow diabetic diet On statin and ACEi F/u CMP and lipid panel Diabetic eye exam: Advised to follow up with Ophthalmology for diabetic eye exam        Other   Morbid obesity due to excess calories ([place])    Diet modification and moderate exercise as tolerated Was on GLP-1 agonist in the past, would prefer to restart once her type II DM status is confirmed      Spinal stenosis of lumbar region with neurogenic claudication    S/p L5-S1 lumbar body fusion, complicated by epidural hematoma - drained now On abx for local infection ppx      Other Visit Diagnoses     Hospitalization within last 30 days    Recent surgery for spinal stenosis, complicated by epidural hematoma Hospital chart reviewed   Nausea       Relevant Medications   ondansetron (ZOFRAN) 4 MG tablet       Meds ordered this encounter  Medications   ondansetron (ZOFRAN) 4 MG tablet    Sig: Take 1 tablet (4 mg total) by mouth every 8 (eight) hours as needed for nausea or vomiting.    Dispense:  20 tablet    Refill:  0    Follow-up: Return in about 3 months (around 04/04/2022) for DM and HTN.    Lindell Spar, MD

## 2022-01-05 NOTE — Assessment & Plan Note (Signed)
Lab Results  Component Value Date   HGBA1C 5.7 (H) 11/30/2021   Has insulin pump, followed by Dr. Fransico Him now Unclear whether she has type I or II DM, but she had responded well to [facility], most likely type II DM Advised to follow diabetic diet On statin and ACEi F/u CMP and lipid panel Diabetic eye exam: Advised to follow up with Ophthalmology for diabetic eye exam

## 2022-01-05 NOTE — Assessment & Plan Note (Signed)
Diet modification and moderate exercise as tolerated Was on GLP-1 agonist in the past, would prefer to restart once her type II DM status is confirmed

## 2022-01-05 NOTE — Progress Notes (Signed)
Urological Symptom Review  Patient is experiencing the following symptoms: Frequent urination Get up at night to urinate Leakage of urine Stream starts and stops Kidney stones   Review of Systems  Gastrointestinal (upper)  : Negative for upper GI symptoms  Gastrointestinal (lower) : Negative for lower GI symptoms  Constitutional : Negative for symptoms  Skin: Negative for skin symptoms  Eyes: Negative for eye symptoms  Ear/Nose/Throat : Negative for Ear/Nose/Throat symptoms  Hematologic/Lymphatic: Negative for Hematologic/Lymphatic symptoms  Cardiovascular : Negative for cardiovascular symptoms  Respiratory : Negative for respiratory symptoms  Endocrine: Negative for endocrine symptoms  Musculoskeletal: Back pain  Neurological: Negative for neurological symptoms  Psychologic: Negative for psychiatric symptoms

## 2022-01-11 ENCOUNTER — Other Ambulatory Visit: Payer: Self-pay

## 2022-01-11 ENCOUNTER — Other Ambulatory Visit (HOSPITAL_COMMUNITY)
Admission: RE | Admit: 2022-01-11 | Discharge: 2022-01-11 | Disposition: A | Discharge status: Home / Self Care | Payer: No Typology Code available for payment source | Source: Ambulatory Visit | Attending: Adult Health | Admitting: Adult Health

## 2022-01-11 ENCOUNTER — Encounter: Payer: Self-pay | Admitting: Adult Health

## 2022-01-11 ENCOUNTER — Ambulatory Visit: Payer: No Typology Code available for payment source | Admitting: Adult Health

## 2022-01-11 VITALS — BP 116/68 | HR 76 | Ht 60.5 in | Wt 201.0 lb

## 2022-01-11 DIAGNOSIS — Z780 Asymptomatic menopausal state: Secondary | ICD-10-CM | POA: Diagnosis not present

## 2022-01-11 DIAGNOSIS — Z1211 Encounter for screening for malignant neoplasm of colon: Secondary | ICD-10-CM | POA: Diagnosis not present

## 2022-01-11 DIAGNOSIS — Z01419 Encounter for gynecological examination (general) (routine) without abnormal findings: Secondary | ICD-10-CM | POA: Diagnosis present

## 2022-01-11 LAB — HEMOCCULT GUIAC POC 1CARD (OFFICE): Fecal Occult Blood, POC: NEGATIVE

## 2022-01-11 NOTE — Progress Notes (Signed)
Patient ID: [name] CapraJill D Pentecost, female   DOB: 09/25/1966, 56 y.o.   MRN: [id number] History of Present Illness: Noreene LarssonJill is a 56 year old white female,married, PM in for a well woman gyn exam and pap. She had back surgery 12/05/21, and using a cane. She says she had blood clot in incision and now it is infected and slow to heal, to see wound care.  PCP is Dr Allena KatzPatel.   Current Medications, Allergies, Past Medical History, Past Surgical History, Family History and Social History were reviewed in [facility] electronic medical record.     Review of Systems: Patient denies any headaches, hearing loss, fatigue, blurred vision, shortness of breath, chest pain, abdominal pain, problems with bowel movements, urination, or intercourse(not active). No joint pain or mood swings.  Can't lose weight    Physical Exam:BP 116/68 (BP Location: Left Arm, Patient Position: Sitting, Cuff Size: Large)    Pulse 76    Ht 5' 0.5" (1.537 m)    Wt 201 lb (91.2 kg)    LMP 07/16/2012    BMI 38.61 kg/m   General:  Well developed, well nourished, no acute distress Skin:  Warm and dry Neck:  Midline trachea, normal thyroid, good ROM, no lymphadenopathy Lungs; Clear to auscultation bilaterally Breast:  No dominant palpable mass, retraction, or nipple discharge Cardiovascular: Regular rate and rhythm Abdomen:  Soft, non tender, no hepatosplenomegaly, she has insulin pump  Pelvic:  External genitalia is normal in appearance, no lesions.  The vagina is pale with loss of rugae.  Urethra has no lesions or masses. The cervix is smooth, pap with HR HPV genotyping performed.  Uterus is felt to be normal size, shape, and contour.  No adnexal masses or tenderness noted.Bladder is non tender, no masses felt. Rectal: Good sphincter tone, no polyps, or hemorrhoids felt.  Hemoccult negative. Extremities/musculoskeletal:  No swelling or varicosities noted, no clubbing or cyanosis Psych:  No mood changes, alert and cooperative,seems happy AA is  0  Fall risk is low [facility] 01/11/2022 01/04/2022 12/26/2021  Decreased Interest 1 0 0  Down, Depressed, Hopeless 2 3 1   PHQ - 2 Score 3 3 1   Altered sleeping 2 3 -  Tired, decreased energy 2 3 -  Change in appetite 2 3 -  Feeling bad or failure about yourself  1 0 -  Trouble concentrating 0 0 -  Moving slowly or fidgety/restless 0 0 -  Suicidal thoughts 0 0 -  PHQ-9 Score 10 12 -  Difficult doing work/chores - Somewhat difficult -  Some recent data might be hidden   She is on Wellbutrin GAD 7 : Generalized Anxiety Score 01/11/2022  Nervous, Anxious, on Edge 1  Control/stop worrying 1  Worry too much - different things 2  Trouble relaxing 2  Restless 2  Easily annoyed or irritable 1  Afraid - awful might happen 0  Total GAD 7 Score 9      Upstream - 01/11/22 1010       Pregnancy Intention Screening   Does the patient want to become pregnant in the next year? N/A    Does the patient's partner want to become pregnant in the next year? N/A    Would the patient like to discuss contraceptive options today? N/A      Contraception Wrap Up   Current Method No Method - Other Reason   ablation and PM   End Method No Method - Other Reason   ablation and PM  Contraception Counseling Provided No             Examination chaperoned by Malachy Mood  LPN    Impression and Plan: 1. Encounter for gynecological examination with Papanicolaou smear of cervix Pap sent GYN physical in 1 year  Pap in 3 if normal Mammogram every 1-2 years Labs with PCP Colonoscopy per GI Talk with PCP about phentermine   2. Encounter for screening fecal occult blood testing  3. Postmenopausal

## 2022-01-15 LAB — CYTOLOGY - PAP
Adequacy: ABSENT
Comment: NEGATIVE
Diagnosis: UNDETERMINED — AB
High risk HPV: NEGATIVE

## 2022-01-16 ENCOUNTER — Encounter: Payer: Self-pay | Admitting: Adult Health

## 2022-01-16 DIAGNOSIS — R87610 Atypical squamous cells of undetermined significance on cytologic smear of cervix (ASC-US): Secondary | ICD-10-CM | POA: Insufficient documentation

## 2022-01-18 ENCOUNTER — Telehealth: Payer: Self-pay | Admitting: *Deleted

## 2022-01-18 NOTE — Chronic Care Management (AMB) (Signed)
°  Care Management   Note  01/18/2022 Name: [name] MRN: [id number] DOB: 1966/06/02  [name] is a 56 y.o. year old female who is a primary care patient of Anabel Halon, MD and is actively engaged with the care management team. I reached out to [name] by phone today to assist with re-scheduling a follow up visit with the RN Case Manager  Follow up plan: Telephone appointment with care management team member scheduled for:01/30/22  [facility] Guide, Embedded Care Coordination [facility]   Care Management  Direct Dial: [phone]

## 2022-01-18 NOTE — Chronic Care Management (AMB) (Signed)
°  Care Management   Note  01/18/2022 Name: [name] MRN: [id number] DOB: 10-Nov-1966  [name] is a 56 y.o. year old female who is a primary care patient of Anabel Halon, MD and is actively engaged with the care management team. I reached out to [name] by phone today to assist with re-scheduling a follow up visit with the RN Case Manager  Follow up plan: Unsuccessful telephone outreach attempt made. A HIPAA compliant phone message was left for the patient providing contact information and requesting a return call.  The care management team will reach out to the patient again over the next 7 days.  If patient returns call to provider office, please advise to call Embedded Care Management Care Guide Misty Stanley at [phone].  Gwenevere Ghazi  Care Guide, Embedded Care Coordination [facility] Management  Direct Dial: [phone]

## 2022-01-20 IMAGING — DX DG HIP (WITH OR WITHOUT PELVIS) 5+V BILAT
5 series · 5 of 5 positions shown · non-contrast
Comparison: CT abdomen/pelvis 02/01/2021

CLINICAL DATA: Low back pain, bilateral hip pain

EXAM:
DG HIP (WITH OR WITHOUT PELVIS) 5+V BILAT

[pelvis ap]
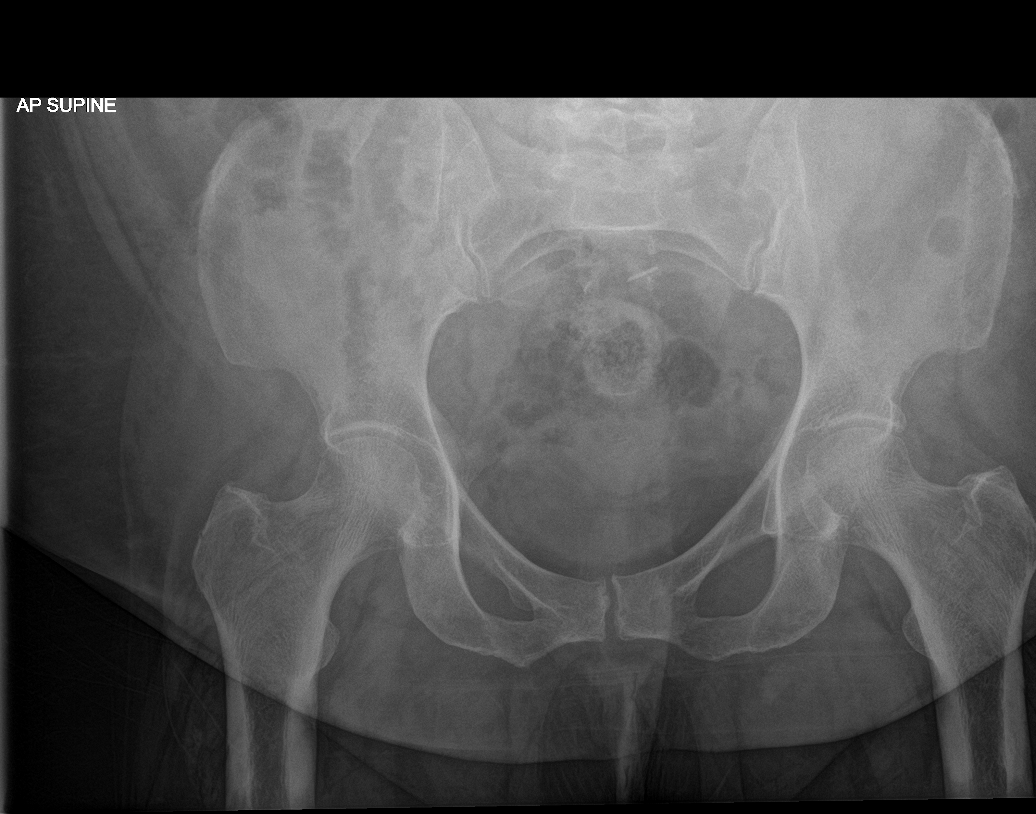

[hip ap (1 of 2)]
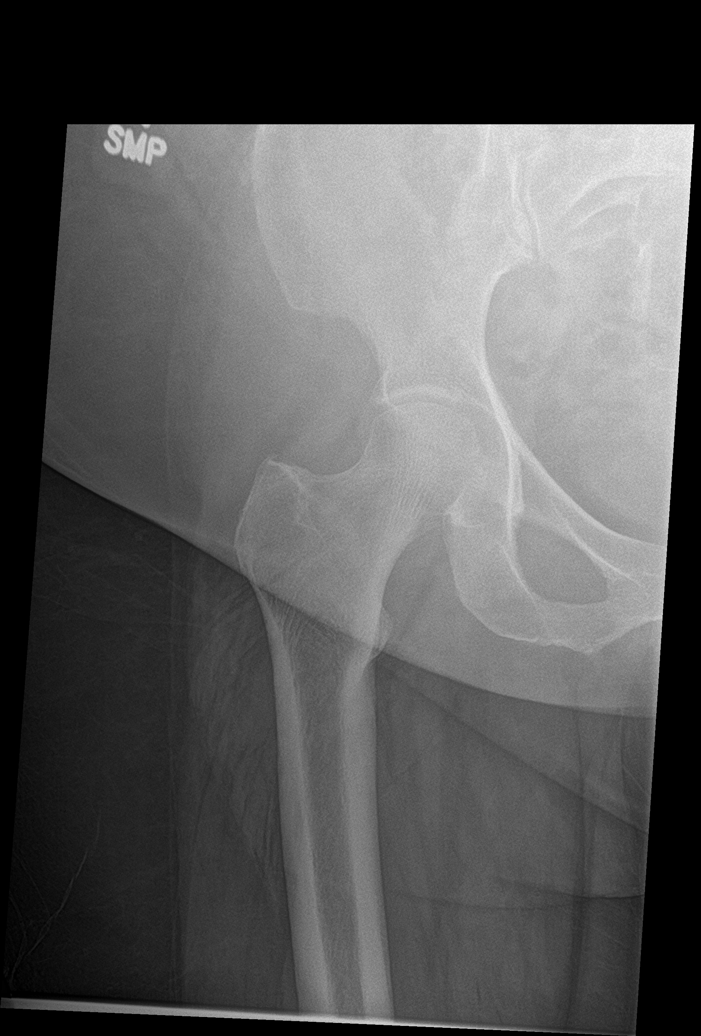

[hip lat (1 of 2)]
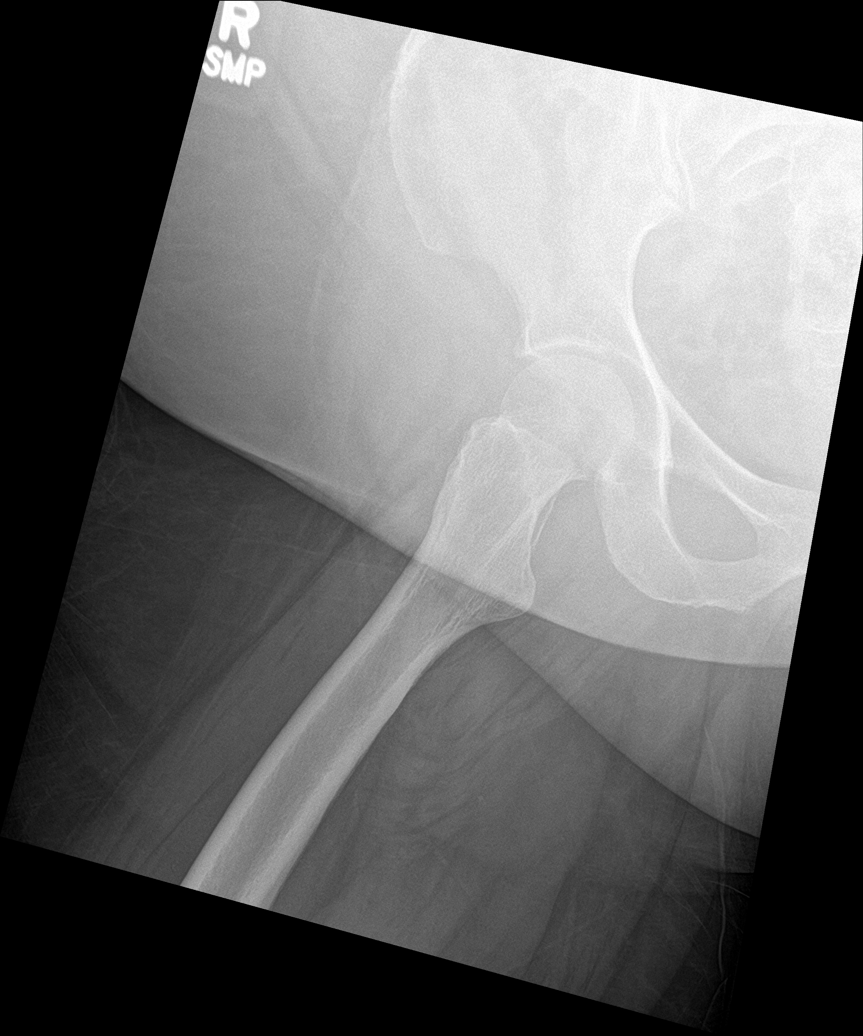

[hip ap (2 of 2)]
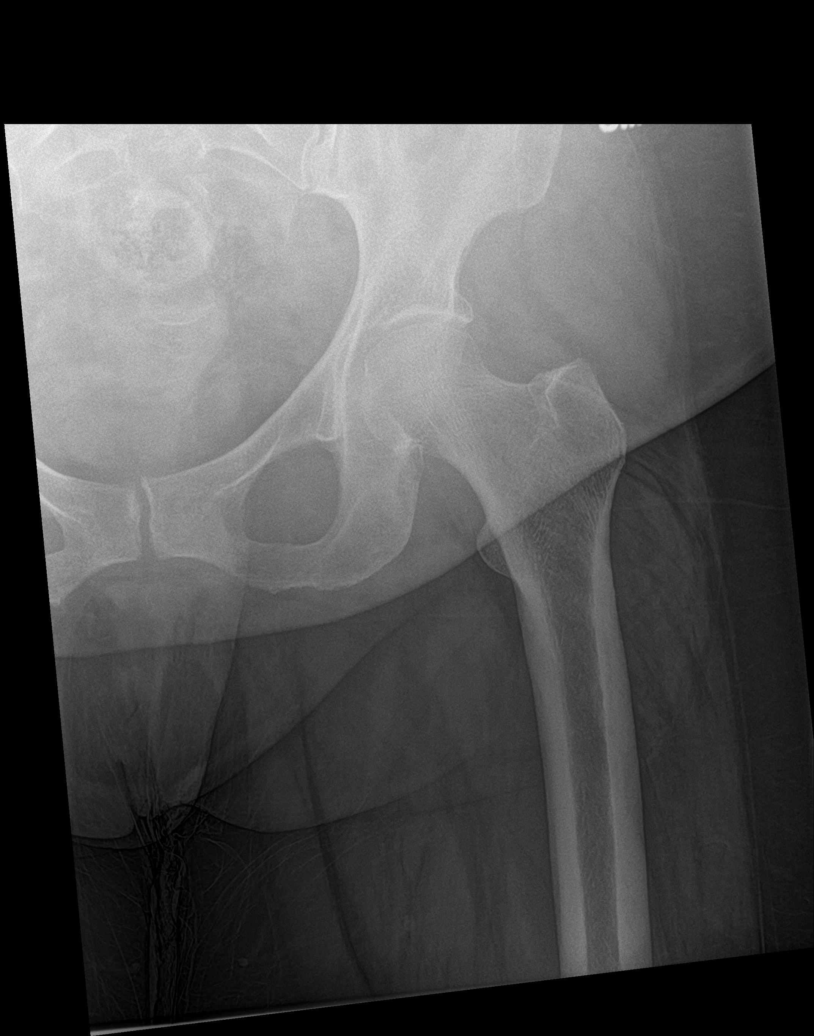

[hip lat (2 of 2)]
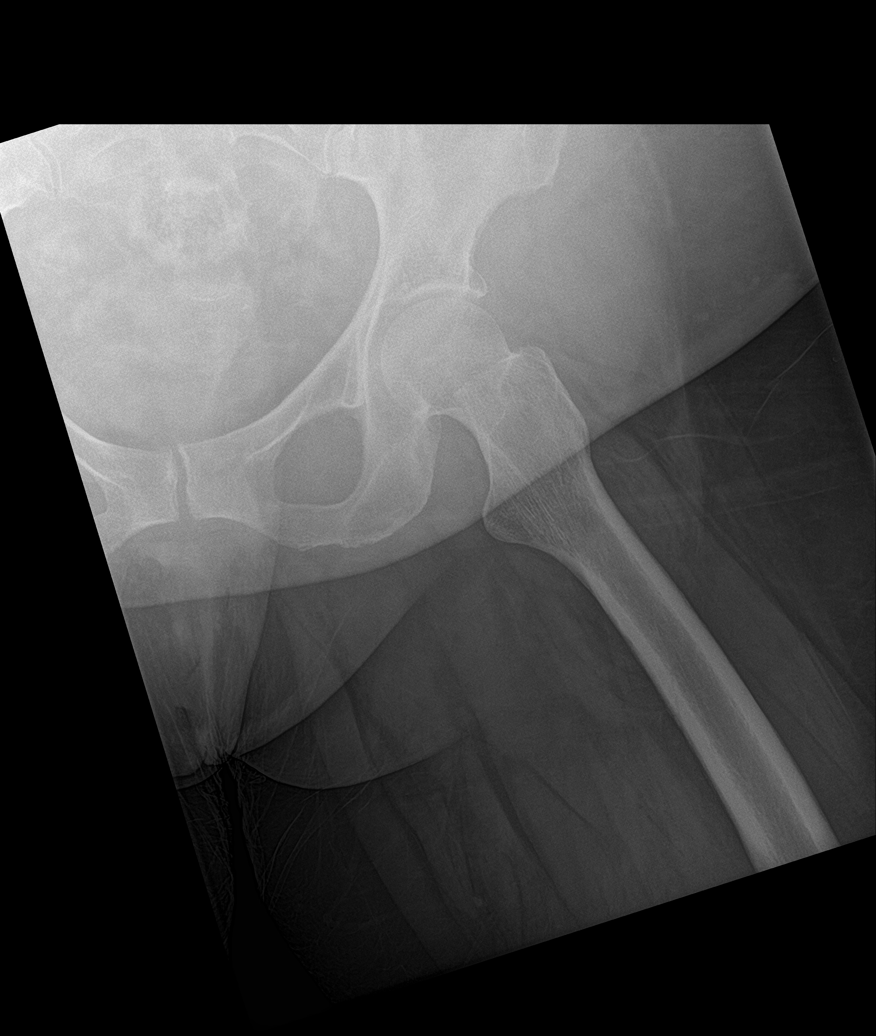

[5 of 5 positions shown; findings below may reference images not displayed]

FINDINGS: There is no acute fracture or dislocation. Femoroacetabular
alignment is normal bilaterally. The joint spaces appear preserved.

The SI joints and symphysis pubis are intact. The soft tissues are
unremarkable.
IMPRESSION: No acute abnormality identified.

## 2022-01-26 ENCOUNTER — Other Ambulatory Visit: Payer: Self-pay

## 2022-01-26 ENCOUNTER — Encounter (HOSPITAL_BASED_OUTPATIENT_CLINIC_OR_DEPARTMENT_OTHER): Payer: No Typology Code available for payment source | Attending: Internal Medicine | Admitting: Internal Medicine

## 2022-01-26 DIAGNOSIS — G4730 Sleep apnea, unspecified: Secondary | ICD-10-CM | POA: Insufficient documentation

## 2022-01-26 DIAGNOSIS — Z6841 Body Mass Index (BMI) 40.0 and over, adult: Secondary | ICD-10-CM | POA: Diagnosis not present

## 2022-01-26 DIAGNOSIS — L98428 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: Secondary | ICD-10-CM

## 2022-01-26 DIAGNOSIS — T8131XA Disruption of external operation (surgical) wound, not elsewhere classified, initial encounter: Secondary | ICD-10-CM

## 2022-01-26 DIAGNOSIS — I10 Essential (primary) hypertension: Secondary | ICD-10-CM | POA: Diagnosis not present

## 2022-01-26 DIAGNOSIS — X58XXXA Exposure to other specified factors, initial encounter: Secondary | ICD-10-CM | POA: Diagnosis not present

## 2022-01-26 DIAGNOSIS — E11622 Type 2 diabetes mellitus with other skin ulcer: Secondary | ICD-10-CM

## 2022-01-26 DIAGNOSIS — Z9641 Presence of insulin pump (external) (internal): Secondary | ICD-10-CM | POA: Insufficient documentation

## 2022-01-26 DIAGNOSIS — M48062 Spinal stenosis, lumbar region with neurogenic claudication: Secondary | ICD-10-CM | POA: Diagnosis not present

## 2022-01-26 DIAGNOSIS — Z794 Long term (current) use of insulin: Secondary | ICD-10-CM | POA: Insufficient documentation

## 2022-01-26 NOTE — Progress Notes (Signed)
[name] ([id number]) Visit Report for 01/26/2022 Chief Complaint Document Details Patient Name: Date of Service: [name] 01/26/2022 7:30 A M Medical Record Number: [id number] Patient Account Number: [id number] Date of Birth/Sex: Treating RN: 06/07/1966 (56 y.o. Female) Zenaida Deed Primary Care Provider: PA TEL, RUTWIK Other Clinician: Referring Provider: Treating Provider/Extender: [name], [name] Weeks in Treatment: 0 Information Obtained from: Patient Chief Complaint Open wound to the back secondary to surgical wound dehiscence Electronic Signature(s) Signed: 01/26/2022 9:33:58 AM By: [name] Entered By: Geralyn Corwin on 01/26/2022 09:22:49 -------------------------------------------------------------------------------- Debridement Details Patient Name: Date of Service: [name] Oxford D. 01/26/2022 7:30 A M Medical Record Number: [id number] Patient Account Number: [id number] Date of Birth/Sex: Treating RN: Sep 12, 1966 (56 y.o. Female) Antonieta Iba Primary Care Provider: PA TEL, RUTWIK Other Clinician: Referring Provider: Treating Provider/Extender: [name], [name] Weeks in Treatment: 0 Debridement Performed for Assessment: Wound #1 Back Performed By: Physician Geralyn Corwin, DO Debridement Type: Debridement Level of Consciousness (Pre-procedure): Awake and Alert Pre-procedure Verification/Time Out Yes - 08:24 Taken: Start Time: 08:25 Pain Control: Other : Benzocaine T Area Debrided (L x W): otal 6 (cm) x 1.6 (cm) = 9.6 (cm) Tissue and other material debrided: Non-Viable, Slough, Subcutaneous, Slough Level: Skin/Subcutaneous Tissue Debridement Description: Excisional Instrument: Curette, Forceps, Scissors Bleeding: Minimum Hemostasis Achieved: Pressure End Time: 08:31 Response to Treatment: Procedure was tolerated well Level of Consciousness (Post- Awake and Alert procedure): Post  Debridement Measurements of Total Wound Length: (cm) 6 Width: (cm) 1.6 Depth: (cm) 0.3 Volume: (cm) 2.262 Character of Wound/Ulcer Post Debridement: Stable Post Procedure Diagnosis Same as Pre-procedure Electronic Signature(s) Signed: 01/26/2022 9:33:58 AM By: [name] Signed: 01/26/2022 12:25:37 PM By: Antonieta Iba Entered By: Antonieta Iba on 01/26/2022 08:31:18 -------------------------------------------------------------------------------- HPI Details Patient Name: Date of Service: [name] Oxford D. 01/26/2022 7:30 A M Medical Record Number: [id number] Patient Account Number: [id number] Date of Birth/Sex: Treating RN: 24-Jul-1966 (56 y.o. 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 She has a past medical history of type 2 diabetes currently controlled on an insulin pump, morbid obesity, spinal stenosis and OSA that presents to the clinic for a 1-48-month history of nonhealing wound to her back. She had surgery on 12/05/2021 with Dr. Maurice Small for spondylolisthesis with lumbar radiculopathy and had an L5-S1 open TLIF and posterior lateral instrumented fusion. She subsequently developed a hematoma and had to have this evacuated on 12/14/2021. She developed wound dehiscence after the surgery and has an open wound to her back. She has not been doing any wound care to the area. She does not even keep this covered. She currently denies signs of infection. She has some chronic pain to the area. Electronic Signature(s) Signed: 01/26/2022 9:33:58 AM By: [name] Entered By: Geralyn Corwin on 01/26/2022  09:28:59 -------------------------------------------------------------------------------- Physical Exam Details Patient Name: Date of Service: [name] Oxford D. 01/26/2022 7:30 A M Medical Record Number: [id number] Patient Account Number: [id number] Date of Birth/Sex: Treating RN: 1966-09-20 (56 y.o. Female) Zenaida Deed Primary Care Provider: PA TEL, RUTWIK Other Clinician: Referring Provider: Treating Provider/Extender: [name], [name] Weeks in Treatment: 0 Constitutional respirations regular, non-labored and within target range for patient.Marland Kitchen  Psychiatric pleasant and cooperative. Notes Back: T the lumbar region she has an elongated wound along a previous surgical incision site. There is granulation tissue present along with nonviable tissue. o Most of the nonviable tissue was tightly adhered. Mild tenderness on palpation. No drainage noted. No surrounding signs of infection. Electronic Signature(s) Signed: 01/26/2022 9:33:58 AM By: [name] Entered By: Geralyn Corwin on 01/26/2022 09:30:03 -------------------------------------------------------------------------------- Physician Orders Details Patient Name: Date of Service: [name] Oxford D. 01/26/2022 7:30 A M Medical Record Number: [id number] Patient Account Number: [id number] Date of Birth/Sex: Treating RN: Nov 02, 1966 (56 y.o. Female) Antonieta Iba Primary Care Provider: PA TEL, RUTWIK Other Clinician: Referring Provider: Treating Provider/Extender: [name], [name] Weeks in Treatment: 0 Verbal / Phone Orders: No Diagnosis Coding Follow-up Appointments ppointment in 1 week. - with Dr. Mikey Bussing (Room 7, [place]) Return A Other: - Prescription for [employer] sent to pharmacy, expect call from them when ready.=Patient could not afford Bathing/ Shower/ Hygiene May shower and wash wound with soap and water. - when changing dressing Additional Orders / Instructions Follow  Nutritious Diet - Continue to monitor/control Blood Sugar Other: - [facility]=Supplies Wound Treatment Wound #1 - Back Cleanser: Soap and Water 1 x Per Day/30 Days Discharge Instructions: May shower and wash wound with dial antibacterial soap and water prior to dressing change. Cleanser: Wound Cleanser (DME) (Generic) 1 x Per Day/30 Days Discharge Instructions: Cleanse the wound with wound cleanser prior to applying a clean dressing using gauze sponges, not tissue or cotton balls. Peri-Wound Care: Skin Prep (DME) (Generic) 1 x Per Day/30 Days Discharge Instructions: Use skin prep as directed Prim Dressing: MediHoney Gel, tube 1.5 (oz) 1 x Per Day/30 Days ary Discharge Instructions: Apply to wound bed as instructed Secondary Dressing: Woven Gauze Sponges 2x2 in (DME) (Generic) 1 x Per Day/30 Days Discharge Instructions: Apply over primary dressing as directed. Secondary Dressing: Zetuvit Plus Silicone Border Dressing 7x7(in/in) (DME) (Generic) 1 x Per Day/30 Days Discharge Instructions: Apply silicone border over primary dressing as directed. Patient Medications llergies: Cipro, penicillin, codeine, morphine, Sulfa (Sulfonamide Antibiotics), vancomycin A Notifications Medication Indication Start End 01/26/2022 Santyl DOSE 1 - topical 250 unit/gram ointment - 1 application daily Electronic Signature(s) Signed: 01/26/2022 11:49:00 AM By: Antonieta Iba Signed: 01/26/2022 12:02:12 PM By: [name] Previous Signature: 01/26/2022 9:33:58 AM Version By: [name] Previous Signature: 01/26/2022 9:24:34 AM Version By: [name] Entered By: Antonieta Iba on 01/26/2022 11:49:00 -------------------------------------------------------------------------------- Problem List Details Patient Name: Date of Service: [name] Oxford D. 01/26/2022 7:30 A M Medical Record Number: [id number] Patient Account Number: [id number] Date of Birth/Sex: Treating RN: 11-Jun-1966 (56 y.o. 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 01/26/2022 No Yes T81.31XA Disruption of external operation (surgical) wound, not elsewhere classified, 01/26/2022 No Yes initial encounter E11.622 Type 2 diabetes mellitus with other skin ulcer 01/26/2022 No Yes M48.062 Spinal stenosis, lumbar region with neurogenic claudication 01/26/2022 No Yes Inactive Problems Resolved Problems Electronic Signature(s) Signed: 01/26/2022 9:33:58 AM By: [name] Entered By: Geralyn Corwin on 01/26/2022 09:21:50 -------------------------------------------------------------------------------- Progress Note Details Patient Name: Date of Service: [name] Oxford D. 01/26/2022 7:30 A M Medical Record Number: [id number] Patient Account Number: [id number] Date of Birth/Sex: Treating RN: 18-Apr-1966 (55 y.o.  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 She has a past medical history of type 2 diabetes currently controlled on an insulin pump, morbid obesity, spinal stenosis and OSA that presents to the clinic for a 1-59-month history of nonhealing wound to her back. She had surgery on 12/05/2021 with Dr. Maurice Small for spondylolisthesis with lumbar radiculopathy and had an L5-S1 open TLIF and posterior lateral instrumented fusion.  She subsequently developed a hematoma and had to have this evacuated on 12/14/2021. She developed wound dehiscence after the surgery and has an open wound to her back. She has not been doing any wound care to the area. She does not even keep this covered. She currently denies signs of infection. She has some chronic pain to the area. Patient History Information obtained from Patient. Allergies Cipro (Reaction: hives), penicillin (Reaction: anaphylaxis), codeine (Reaction: hallucinations), morphine, Sulfa (Sulfonamide Antibiotics) (Reaction: N/V), vancomycin (Reaction: itching) Family History Cancer - Paternal Grandparents, Diabetes - Mother,Father, Heart Disease - Paternal Grandparents,Maternal Grandparents, Hypertension - Maternal Grandparents,Paternal Grandparents, Stroke - Mother,Maternal Grandparents, No family history of Hereditary Spherocytosis, Kidney Disease, Lung Disease, Seizures, Thyroid Problems, Tuberculosis. Social History Never smoker, Marital Status - Married, Alcohol Use - Never, Drug Use - No History, Caffeine Use - Moderate. Medical History Cardiovascular Patient has history of Hypertension Endocrine Patient has history of Type I Diabetes Neurologic Patient has history of Neuropathy Patient is treated with Insulin. Blood sugar is tested. Medical A Surgical History Notes nd Musculoskeletal Spinal Stenosis-Surgery 12/05/21 Review of Systems (ROS) Eyes Complains or has symptoms of Glasses / Contacts. Ear/Nose/Mouth/Throat Denies complaints or symptoms of Chronic sinus problems or rhinitis. Respiratory Denies complaints or symptoms of Chronic or frequent coughs, Shortness of Breath. Gastrointestinal Denies complaints or symptoms of Frequent diarrhea, Nausea, Vomiting. Genitourinary Denies complaints or symptoms of Frequent urination. Integumentary (Skin) Complains or has symptoms of Wounds - back. Psychiatric Denies complaints or symptoms of Claustrophobia,  Suicidal. Objective Constitutional respirations regular, non-labored and within target range for patient.. Vitals Time Taken: 7:47 AM, Height: 58 in, Source: Stated, Weight: 194 lbs, Source: Stated, BMI: 40.5, Temperature: 98.4 F, Pulse: 81 bpm, Respiratory Rate: 16 breaths/min, Blood Pressure: 153/82 mmHg, Capillary Blood Glucose: 140 mg/dl. Psychiatric pleasant and cooperative. General Notes: Back: T the lumbar region she has an elongated wound along a previous surgical incision site. There is granulation tissue present along with o nonviable tissue. Most of the nonviable tissue was tightly adhered. Mild tenderness on palpation. No drainage noted. No surrounding signs of infection. Integumentary (Hair, Skin) Wound #1 status is Open. Original cause of wound was Surgical Injury. The date acquired was: 12/14/2021. The wound is located on the Back. The wound measures 6cm length x 1.6cm width x 0.3cm depth; 7.54cm^2 area and 2.262cm^3 volume. There is Fat Layer (Subcutaneous Tissue) exposed. There is no tunneling or undermining noted. There is a medium amount of serosanguineous drainage noted. The wound margin is well defined and not attached to the wound base. There is medium (34-66%) red, pink granulation within the wound bed. There is a medium (34-66%) amount of necrotic tissue within the wound bed including Adherent Slough. Assessment Active Problems ICD-10 Non-pressure chronic ulcer of back  with other specified severity Disruption of external operation (surgical) wound, not elsewhere classified, initial encounter Type 2 diabetes mellitus with other skin ulcer Spinal stenosis, lumbar region with neurogenic claudication Patient presents with a 1 to 49-month history of nonhealing ulcer to her back status post wound dehiscence for hematoma evacuation following spinal fusion surgery. There are no signs of surrounding infection. I debrided nonviable tissue. This is still tightly adhered and I  recommended doing Santyl daily to help with debridement. Also recommended a wound cleanser to use when doing dressing changes. I recommended she keep this covered at all times. If she is not able to obtain Santyl I recommended using antibiotic ointment in the meantime and calling our office. Follow-up in 1 week. 50 minutes was spent on the encounter including face-to-face, EMR review and coordination of care Procedures Wound #1 Pre-procedure diagnosis of Wound #1 is a Dehisced Wound located on the Back . There was a Excisional Skin/Subcutaneous Tissue Debridement with a total area of 9.6 sq cm performed by Geralyn Corwin, DO. With the following instrument(s): Curette, Forceps, and Scissors to remove Non-Viable tissue/material. Material removed includes Subcutaneous Tissue and Slough and after achieving pain control using Other (Benzocaine). No specimens were taken. A time out was conducted at 08:24, prior to the start of the procedure. A Minimum amount of bleeding was controlled with Pressure. The procedure was tolerated well. Post Debridement Measurements: 6cm length x 1.6cm width x 0.3cm depth; 2.262cm^3 volume. Character of Wound/Ulcer Post Debridement is stable. Post procedure Diagnosis Wound #1: Same as Pre-Procedure Plan Follow-up Appointments: Return Appointment in 1 week. - with Dr. Mikey Bussing (Room 7, [place]) Other: - Prescription for [facility] sent to pharmacy, expect call from them when ready. Bathing/ Shower/ Hygiene: May shower and wash wound with soap and water. - when changing dressing Additional Orders / Instructions: Follow Nutritious Diet - Continue to monitor/control Blood Sugar Other: - [facility]=Supplies The following medication(s) was prescribed: Santyl topical 250 unit/gram ointment 1 1 application daily starting 01/26/2022 WOUND #1: - Back Wound Laterality: Cleanser: Soap and Water 1 x Per Day/30 Days Discharge Instructions: May shower and wash wound with dial antibacterial soap  and water prior to dressing change. Cleanser: Wound Cleanser (DME) (Generic) 1 x Per Day/30 Days Discharge Instructions: Cleanse the wound with wound cleanser prior to applying a clean dressing using gauze sponges, not tissue or cotton balls. Peri-Wound Care: Skin Prep (DME) (Generic) 1 x Per Day/30 Days Discharge Instructions: Use skin prep as directed Prim Dressing: Santyl Ointment 1 x Per Day/30 Days ary Discharge Instructions: Apply nickel thick amount to wound bed as instructed Secondary Dressing: Woven Gauze Sponges 2x2 in (DME) (Generic) 1 x Per Day/30 Days Discharge Instructions: Apply over primary dressing as directed. Secondary Dressing: Zetuvit Plus Silicone Border Dressing 7x7(in/in) (DME) (Generic) 1 x Per Day/30 Days Discharge Instructions: Apply silicone border over primary dressing as directed. 1. In office sharp debridement 2. Santyl daily 3. Follow-up in 1 week Electronic Signature(s) Signed: 01/26/2022 9:33:58 AM By: [name] Entered By: Geralyn Corwin on 01/26/2022 09:33:12 -------------------------------------------------------------------------------- HxROS Details Patient Name: Date of Service: [name] Oxford D. 01/26/2022 7:30 A M Medical Record Number: [id number] Patient Account Number: [id number] Date of Birth/Sex: Treating RN: 1966/04/02 (56 y.o. Female) Antonieta Iba Primary Care Provider: PA TEL, R[place] Other Clinician: Referring Provider: Treating Provider/Extender: [name], [name] Weeks in Treatment: 0 Information Obtained From Patient Eyes Complaints and Symptoms: Positive for: Glasses / Contacts Ear/Nose/Mouth/Throat Complaints and Symptoms: Negative  for: Chronic sinus problems or rhinitis Respiratory Complaints and Symptoms: Negative for: Chronic or frequent coughs; Shortness of Breath Gastrointestinal Complaints and Symptoms: Negative for: Frequent diarrhea; Nausea; Vomiting Genitourinary Complaints and  Symptoms: Negative for: Frequent urination Integumentary (Skin) Complaints and Symptoms: Positive for: Wounds - back Psychiatric Complaints and Symptoms: Negative for: Claustrophobia; Suicidal Hematologic/Lymphatic Cardiovascular Medical History: Positive for: Hypertension Endocrine Medical History: Positive for: Type I Diabetes Time with diabetes: 40 years Treated with: Insulin Blood sugar tested every day: Yes Tested : 3x day Immunological Musculoskeletal Medical History: Past Medical History Notes: Spinal Stenosis-Surgery 12/05/21 Neurologic Medical History: Positive for: Neuropathy Oncologic Immunizations Pneumococcal Vaccine: Received Pneumococcal Vaccination: Yes Received Pneumococcal Vaccination On or After 60th Birthday: No Implantable Devices None Family and Social History Cancer: Yes - Paternal Grandparents; Diabetes: Yes - Mother,Father; Heart Disease: Yes - Paternal Grandparents,Maternal Grandparents; Hereditary Spherocytosis: No; Hypertension: Yes - Maternal Grandparents,Paternal Grandparents; Kidney Disease: No; Lung Disease: No; Seizures: No; Stroke: Yes - Mother,Maternal Grandparents; Thyroid Problems: No; Tuberculosis: No; Never smoker; Marital Status - Married; Alcohol Use: Never; Drug Use: No History; Caffeine Use: Moderate; Financial Concerns: No; Food, Clothing or Shelter Needs: No; Support System Lacking: No; Transportation Concerns: No Electronic Signature(s) Signed: 01/26/2022 9:33:58 AM By: Geralyn CorwinHoffman, Tiyonna Sardinha DO Signed: 01/26/2022 12:25:37 PM By: Antonieta IbaBarnhart, Jodi Entered By: Antonieta IbaBarnhart, Jodi on 01/26/2022 07:55:59 -------------------------------------------------------------------------------- SuperBill Details Patient Name: Date of Service: [name] OxfordMA RTIN, Senetra D. 01/26/2022 Medical Record Number: [id number] Patient Account Number: [id number] Date of Birth/Sex: Treating RN: 12/15/1965 (56 y.o. 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 T81.31XA Disruption of external operation (surgical) wound, not elsewhere classified, initial encounter E11.622 Type 2 diabetes mellitus with other skin ulcer M48.062 Spinal stenosis, lumbar region with neurogenic claudication Facility Procedures CPT4 Code: [id number] Description: [phone] - WOUND CARE VISIT-LEV 2 EST PT Modifier: 25 Quantity: 1 CPT4 Code: [id number] Description: 11042 - DEB SUBQ TISSUE 20 SQ CM/< ICD-10 Diagnosis Description L98.428 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 E11.622 Type 2 diabetes mellitus with other skin ulcer Modifier: Quantity: 1 Physician Procedures : CPT4 Code Description Modifier [id number] 99204 - WC PHYS LEVEL 4 - NEW PT ICD-10 Diagnosis Description L98.428 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 T81.31XA Disruption of external operation (surgical) wound, not elsewhere  classified, initial encounter E11.622 Type 2 diabetes mellitus with other skin ulcer M48.062 Spinal stenosis, lumbar region with neurogenic claudication Quantity: 1 : [id number] 11042 - WC PHYS SUBQ TISS 20 SQ CM ICD-10 Diagnosis Description L98.428 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

## 2022-01-26 NOTE — Progress Notes (Signed)
LUNABELLE, OATLEY ([id number]) Visit Report for 01/26/2022 Abuse Risk Screen Details Patient Name: Date of Service: [place] VERNECIA, UMBLE 01/26/2022 7:30 A M Medical Record Number: [id number] Patient Account Number: [id number] Date of Birth/Sex: Treating RN: 1966-09-06 (56 y.o. Female) Antonieta Iba Primary Care Warrick Llera: PA TEL, RUTWIK Other Clinician: Referring Fermina Mishkin: Treating Lakeia Bradshaw/Extender: [name], [name] Weeks in Treatment: 0 Abuse Risk Screen Items Answer ABUSE RISK SCREEN: Has anyone close to you tried to hurt or harm you recentlyo No Do you feel uncomfortable with anyone in your familyo No Has anyone forced you do things that you didnt want to doo No Electronic Signature(s) Signed: 01/26/2022 12:25:37 PM By: Antonieta Iba Entered By: Antonieta Iba on 01/26/2022 07:56:07 -------------------------------------------------------------------------------- Activities of Daily Living Details Patient Name: Date of Service: [place] NIMCO, BIVENS 01/26/2022 7:30 A M Medical Record Number: [id number] Patient Account Number: [id number] Date of Birth/Sex: Treating RN: [birth year] (56 y.o. Female) Antonieta Iba Primary Care Felisha Claytor: PA TEL, RUTWIK Other Clinician: Referring Ashly Goethe: Treating Dasia Guerrier/Extender: [name], [name] Weeks in Treatment: 0 Activities of Daily Living Items Answer Activities of Daily Living (Please select one for each item) Drive Automobile Completely Able T Medications ake Completely Able Use T elephone Completely Able Care for Appearance Completely Able Use T oilet Completely Able Bath / Shower Completely Able Dress Self Completely Able Feed Self Completely Able Walk Completely Able Get In / Out Bed Completely Able Housework Completely Able Prepare Meals Completely Able Handle Money Completely Able Shop for Self Completely Able Electronic Signature(s) Signed: 01/26/2022 12:25:37 PM By: Antonieta Iba Entered By:  Antonieta Iba on 01/26/2022 07:56:31 -------------------------------------------------------------------------------- Education Screening Details Patient Name: Date of Service: [name] 01/26/2022 7:30 A M Medical Record Number: [id number] Patient Account Number: [id number] Date of Birth/Sex: Treating RN: 1966/10/13 (56 y.o. Female) Antonieta Iba Primary Care Tekeisha Hakim: PA TEL, RUTWIK Other Clinician: Referring Tranisha Tissue: Treating Emmi Wertheim/Extender: [name], [name] Weeks in Treatment: 0 Primary Learner Assessed: Patient Learning Preferences/Education Level/Primary Language Learning Preference: Explanation, Demonstration, Printed Material Highest Education Level: College or Above Preferred Language: English Cognitive Barrier Language Barrier: No Translator Needed: No Memory Deficit: No Emotional Barrier: No Cultural/Religious Beliefs Affecting Medical Care: No Physical Barrier Impaired Vision: Yes Glasses Impaired Hearing: No Decreased Hand dexterity: No Knowledge/Comprehension Knowledge Level: High Comprehension Level: High Ability to understand written instructions: High Ability to understand verbal instructions: High Motivation Anxiety Level: Calm Cooperation: Cooperative Education Importance: Acknowledges Need Interest in Health Problems: Asks Questions Perception: Coherent Willingness to Engage in Self-Management High Activities: Readiness to Engage in Self-Management High Activities: Electronic Signature(s) Signed: 01/26/2022 12:25:37 PM By: Antonieta Iba Entered By: Antonieta Iba on 01/26/2022 07:56:59 -------------------------------------------------------------------------------- Fall Risk Assessment Details Patient Name: Date of Service: [place] Con Memos D. 01/26/2022 7:30 A M Medical Record Number: [id number] Patient Account Number: [id number] Date of Birth/Sex: Treating RN: 1966/05/11 (57 y.o. Female) Antonieta Iba Primary  Care Alaria Oconnor: PA TEL, RUTWIK Other Clinician: Referring Kharter Brew: Treating Jaquese Irving/Extender: [name], [name] Weeks in Treatment: 0 Fall Risk Assessment Items Have you had 2 or more falls in the last 12 monthso 0 No Have you had any fall that resulted in injury in the last 12 monthso 0 No FALLS RISK SCREEN History of falling - immediate or within 3 months 0 No Secondary diagnosis (Do you have 2 or more medical diagnoseso) 0 No Ambulatory aid None/bed rest/wheelchair/nurse 0 Yes Crutches/cane/walker 0 No  Furniture 0 No Intravenous therapy Access/Saline/Heparin Lock 0 No Gait/Transferring Normal/ bed rest/ wheelchair 0 Yes Weak (short steps with or without shuffle, stooped but able to lift head while walking, may seek 0 No support from furniture) Impaired (short steps with shuffle, may have difficulty arising from chair, head down, impaired 0 No balance) Mental Status Oriented to own ability 0 Yes Electronic Signature(s) Signed: 01/26/2022 12:25:37 PM By: Antonieta IbaBarnhart, Jodi Entered By: Antonieta IbaBarnhart, Jodi on 01/26/2022 07:57:11 -------------------------------------------------------------------------------- Foot Assessment Details Patient Name: Date of Service: Crisoforo OxfordMA RTIN, [place] D. 01/26/2022 7:30 A M Medical Record Number: [id number] Patient Account Number: [id number] Date of Birth/Sex: Treating RN: 01/06/1966 (56 y.o. Female) Antonieta IbaBarnhart, Jodi Primary Care Devontae Casasola: PA TEL, RUTWIK Other Clinician: Referring Patra Gherardi: Treating Deanglo Hissong/Extender: Hansel StarlingHoffman, Jessica O STERGA RD, [name] Weeks in Treatment: 0 Foot Assessment Items Site Locations + = Sensation present, - = Sensation absent, C = Callus, U = Ulcer R = Redness, W = Warmth, M = Maceration, PU = Pre-ulcerative lesion F = Fissure, S = Swelling, D = Dryness Assessment Right: Left: Other Deformity: No No Prior Foot Ulcer: No No Prior Amputation: No No Charcot Joint: No No Ambulatory  Status: Gait: Notes N/A: Back Wound Electronic Signature(s) Signed: 01/26/2022 12:25:37 PM By: Antonieta IbaBarnhart, Jodi Entered By: Antonieta IbaBarnhart, Jodi on 01/26/2022 07:58:07 -------------------------------------------------------------------------------- Nutrition Risk Screening Details Patient Name: Date of Service: [place] Angelina SheriffRTIN, Lamonica D. 01/26/2022 7:30 A M Medical Record Number: [id number] Patient Account Number: [id number] Date of Birth/Sex: Treating RN: 06/17/1966 (56 y.o. Female) Antonieta IbaBarnhart, Jodi Primary Care Shalunda Lindh: PA TEL, RUTWIK Other Clinician: Referring Muhammad Vacca: Treating Tika Hannis/Extender: Hansel StarlingHoffman, Jessica O STERGA RD, [name] Weeks in Treatment: 0 Height (in): 58 Weight (lbs): 194 Body Mass Index (BMI): 40.5 Nutrition Risk Screening Items Score Screening NUTRITION RISK SCREEN: I have an illness or condition that made me change the kind and/or amount of food I eat 0 No I eat fewer than two meals per day 0 No I eat few fruits and vegetables, or milk products 0 No I have three or more drinks of beer, liquor or wine almost every day 0 No I have tooth or mouth problems that make it hard for me to eat 0 No I don't always have enough money to buy the food I need 0 No I eat alone most of the time 0 No I take three or more different prescribed or over-the-counter drugs a day 1 Yes Without wanting to, I have lost or gained 10 pounds in the last six months 0 No I am not always physically able to shop, cook and/or feed myself 0 No Nutrition Protocols Good Risk Protocol 0 No interventions needed Moderate Risk Protocol High Risk Proctocol Risk Level: Good Risk Score: 1 Electronic Signature(s) Signed: 01/26/2022 12:25:37 PM By: Antonieta IbaBarnhart, Jodi Entered By: Antonieta IbaBarnhart, Jodi on 01/26/2022 07:57:50

## 2022-01-29 NOTE — Progress Notes (Signed)
[name], [name] ([id number]) Visit Report for 01/26/2022 Allergy List Details Patient Name: Date of Service: [place] MACKINZI, GOBLE 01/26/2022 7:30 A M Medical Record Number: [id number] Patient Account Number: [id number] Date of Birth/Sex: Treating RN: [birth year] (56 y.o. Female) Lorrin Jackson Primary Care Rynell Ciotti: PA TEL, [place] Other Clinician: Referring Ariell Gunnels: Treating Raphael Fitzpatrick/Extender: Caprice Red RD, THO [name] S Weeks in Treatment: 0 Allergies Active Allergies Cipro Reaction: hives penicillin Reaction: anaphylaxis codeine Reaction: hallucinations morphine Sulfa (Sulfonamide Antibiotics) Reaction: N/V vancomycin Reaction: itching Allergy Notes Electronic Signature(s) Signed: 01/26/2022 12:25:37 PM By: Lorrin Jackson Entered By: Lorrin Jackson on 01/26/2022 07:51:08 -------------------------------------------------------------------------------- Arrival Information Details Patient Name: Date of Service: [name] D. 01/26/2022 7:30 A M Medical Record Number: [id number] Patient Account Number: [id number] Date of Birth/Sex: Treating RN: 12-Feb-1966 (56 y.o. Female) Lorrin Jackson Primary Care Valmai Vandenberghe: PA TEL, [place] Other Clinician: Referring Osinachi Navarrette: Treating Rylee Nuzum/Extender: Caprice Red RD, THO [name] S Weeks in Treatment: 0 Visit Information Patient Arrived: Ambulatory Arrival Time: 07:46 Transfer Assistance: None Patient Identification Verified: Yes Secondary Verification Process Completed: Yes Patient Requires Transmission-Based Precautions: No Patient Has Alerts: No Electronic Signature(s) Signed: 01/26/2022 12:25:37 PM By: Lorrin Jackson Entered By: Lorrin Jackson on 01/26/2022 07:47:12 -------------------------------------------------------------------------------- Clinic Level of Care Assessment Details Patient Name: Date of Service: [place] PACITA, VENA 01/26/2022 7:30 A M Medical Record Number: [id number] Patient Account Number:  [id number] Date of Birth/Sex: Treating RN: 02-11-1966 (56 y.o. Female) Lorrin Jackson Primary Care Quamaine Webb: PA TEL, [place] Other Clinician: Referring Rupal Childress: Treating Linkon Siverson/Extender: Caprice Red RD, THO [name] S Weeks in Treatment: 0 Clinic Level of Care Assessment Items TOOL 1 Quantity Score X- 1 0 Use when EandM and Procedure is performed on INITIAL visit ASSESSMENTS - Nursing Assessment / Reassessment X- 1 20 General Physical Exam (combine w/ comprehensive assessment (listed just below) when performed on new pt. evals) X- 1 25 Comprehensive Assessment (HX, ROS, Risk Assessments, Wounds Hx, etc.) ASSESSMENTS - Wound and Skin Assessment / Reassessment []  - 0 Dermatologic / Skin Assessment (not related to wound area) ASSESSMENTS - Ostomy and/or Continence Assessment and Care []  - 0 Incontinence Assessment and Management []  - 0 Ostomy Care Assessment and Management (repouching, etc.) PROCESS - Coordination of Care []  - 0 Simple Patient / Family Education for ongoing care X- 1 20 Complex (extensive) Patient / Family Education for ongoing care X- 1 10 Staff obtains Programmer, systems, Records, T Results / Process Orders est []  - 0 Staff telephones HHA, Nursing Homes / Clarify orders / etc []  - 0 Routine Transfer to another Facility (non-emergent condition) []  - 0 Routine Hospital Admission (non-emergent condition) []  - 0 New Admissions / Biomedical engineer / Ordering NPWT Apligraf, etc. , []  - 0 Emergency Hospital Admission (emergent condition) PROCESS - Special Needs []  - 0 Pediatric / Minor Patient Management []  - 0 Isolation Patient Management []  - 0 Hearing / Language / Visual special needs []  - 0 Assessment of Community assistance (transportation, D/C planning, etc.) []  - 0 Additional assistance / Altered mentation []  - 0 Support Surface(s) Assessment (bed, cushion, seat, etc.) INTERVENTIONS - Miscellaneous []  - 0 External ear exam []  -  0 Patient Transfer (multiple staff / Civil Service fast streamer / Similar devices) []  - 0 Simple Staple / Suture removal (25 or less) []  - 0 Complex Staple / Suture removal (26 or more) []  - 0 Hypo/Hyperglycemic Management (do not check if billed separately) []  - 0 Ankle / Brachial Index (ABI) - do not check if  billed separately Has the patient been seen at the hospital within the last three years: Yes Total Score: 75 Level Of Care: New/Established - Level 2 Electronic Signature(s) Signed: 01/26/2022 12:25:37 PM By: Lorrin Jackson Entered By: Lorrin Jackson on 01/26/2022 08:45:08 -------------------------------------------------------------------------------- Encounter Discharge Information Details Patient Name: Date of Service: [name] D. 01/26/2022 7:30 A M Medical Record Number: [id number] Patient Account Number: [id number] Date of Birth/Sex: Treating RN: 05-18-1966 (56 y.o. Female) Lorrin Jackson Primary Care Wasif Simonich: PA TEL, [place] Other Clinician: Referring Jaxn Chiquito: Treating Lilinoe Acklin/Extender: Caprice Red RD, THO [name] S Weeks in Treatment: 0 Encounter Discharge Information Items Post Procedure Vitals Discharge Condition: Stable Temperature (F): 98.4 Ambulatory Status: Ambulatory Pulse (bpm): 81 Discharge Destination: Home Respiratory Rate (breaths/min): 16 Transportation: Private Auto Blood Pressure (mmHg): 153/82 Schedule Follow-up Appointment: Yes Clinical Summary of Care: Provided on 01/26/2022 Form Type Recipient Paper Patient Patient Electronic Signature(s) Signed: 01/26/2022 12:25:37 PM By: Lorrin Jackson Entered By: Lorrin Jackson on 01/26/2022 08:46:25 -------------------------------------------------------------------------------- Lower Extremity Assessment Details Patient Name: Date of Service: [name] D. 01/26/2022 7:30 A M Medical Record Number: [id number] Patient Account Number: [id number] Date of Birth/Sex: Treating RN: 1966/03/17 (56 y.o.  Female) Lorrin Jackson Primary Care Theodor Mustin: PA TEL, [place] Other Clinician: Referring Shandricka Monroy: Treating Darleny Sem/Extender: Caprice Red RD, THO [name] S Weeks in Treatment: 0 Electronic Signature(s) Signed: 01/26/2022 12:25:37 PM By: Lorrin Jackson Entered By: Lorrin Jackson on 01/26/2022 07:58:23 -------------------------------------------------------------------------------- Multi Wound Chart Details Patient Name: Date of Service: [name] D. 01/26/2022 7:30 A M Medical Record Number: [id number] Patient Account Number: [id number] Date of Birth/Sex: Treating RN: 1966-09-04 (56 y.o. Female) Baruch Gouty Primary Care Maleeah Crossman: PA TEL, [place] Other Clinician: Referring Rhet Rorke: Treating Wyland Rastetter/Extender: Caprice Red RD, THO [name] S Weeks in Treatment: 0 Vital Signs Height(in): 58 Capillary Blood Glucose(mg/dl): 140 Weight(lbs): 194 Pulse(bpm): 17 Body Mass Index(BMI): 40.5 Blood Pressure(mmHg): 153/82 Temperature(F): 98.4 Respiratory Rate(breaths/min): 16 Photos: [N/A:N/A] Back N/A N/A Wound Location: Surgical Injury N/A N/A Wounding Event: Dehisced Wound N/A N/A Primary Etiology: Hypertension, Type I Diabetes, N/A N/A Comorbid History: Neuropathy 12/14/2021 N/A N/A Date Acquired: 0 N/A N/A Weeks of Treatment: Open N/A N/A Wound Status: No N/A N/A Wound Recurrence: 6x1.6x0.3 N/A N/A Measurements L x W x D (cm) 7.54 N/A N/A A (cm) : rea 2.262 N/A N/A Volume (cm) : 0.00% N/A N/A % Reduction in A rea: 0.00% N/A N/A % Reduction in Volume: Full Thickness Without Exposed N/A N/A Classification: Support Structures Medium N/A N/A Exudate A mount: Serosanguineous N/A N/A Exudate Type: red, brown N/A N/A Exudate Color: Well defined, not attached N/A N/A Wound Margin: Medium (34-66%) N/A N/A Granulation A mount: Red, Pink N/A N/A Granulation Quality: Medium (34-66%) N/A N/A Necrotic A mount: Fat Layer (Subcutaneous  Tissue): Yes N/A N/A Exposed Structures: Fascia: No Tendon: No Muscle: No Joint: No Bone: No None N/A N/A Epithelialization: Debridement - Excisional N/A N/A Debridement: Pre-procedure Verification/Time Out 08:24 N/A N/A Taken: Other N/A N/A Pain Control: Subcutaneous, Slough N/A N/A Tissue Debrided: Skin/Subcutaneous Tissue N/A N/A Level: 9.6 N/A N/A Debridement A (sq cm): rea Curette, Forceps, Scissors N/A N/A Instrument: Minimum N/A N/A Bleeding: Pressure N/A N/A Hemostasis A chieved: Procedure was tolerated well N/A N/A Debridement Treatment Response: 6x1.6x0.3 N/A N/A Post Debridement Measurements L x W x D (cm) 2.262 N/A N/A Post Debridement Volume: (cm) Debridement N/A N/A Procedures Performed: Treatment Notes Wound #1 (Back) Cleanser Soap and Water Discharge Instruction: May shower and wash wound with  dial antibacterial soap and water prior to dressing change. Wound Cleanser Discharge Instruction: Cleanse the wound with wound cleanser prior to applying a clean dressing using gauze sponges, not tissue or cotton balls. Peri-Wound Care Skin Prep Discharge Instruction: Use skin prep as directed Topical Primary Dressing Santyl Ointment Discharge Instruction: Apply nickel thick amount to wound bed as instructed Secondary Dressing Woven Gauze Sponges 2x2 in Discharge Instruction: Apply over primary dressing as directed. Zetuvit Plus Silicone Border Dressing 7x7(in/in) Discharge Instruction: Apply silicone border over primary dressing as directed. Secured With Compression Wrap Compression Stockings Environmental education officer) Signed: 01/26/2022 9:33:58 AM By: Kalman Shan DO Signed: 01/29/2022 5:43:45 PM By: Baruch Gouty RN, BSN Entered By: Kalman Shan on 01/26/2022 09:22:20 -------------------------------------------------------------------------------- Multi-Disciplinary Care Plan Details Patient Name: Date of Service: [name]  D. 01/26/2022 7:30 A M Medical Record Number: [id number] Patient Account Number: [id number] Date of Birth/Sex: Treating RN: [birth year] (56 y.o. Female) Lorrin Jackson Primary Care Mickaela Starlin: PA TEL, [place] Other Clinician: Referring Carly Sabo: Treating Damica Gravlin/Extender: Caprice Red RD, THO [name] S Weeks in Treatment: 0 Active Inactive Wound/Skin Impairment Nursing Diagnoses: Impaired tissue integrity Goals: Patient/caregiver will verbalize understanding of skin care regimen Date Initiated: 01/26/2022 Target Resolution Date: 02/22/2022 Goal Status: Active Ulcer/skin breakdown will have a volume reduction of 30% by week 4 Date Initiated: 01/26/2022 Target Resolution Date: 02/22/2022 Goal Status: Active Interventions: Assess patient/caregiver ability to obtain necessary supplies Assess patient/caregiver ability to perform ulcer/skin care regimen upon admission and as needed Assess ulceration(s) every visit Provide education on ulcer and skin care Treatment Activities: Topical wound management initiated : 01/26/2022 Notes: Electronic Signature(s) Signed: 01/26/2022 8:11:12 AM By: Lorrin Jackson Entered By: Lorrin Jackson on 01/26/2022 08:11:11 -------------------------------------------------------------------------------- Pain Assessment Details Patient Name: Date of Service: [name] D. 01/26/2022 7:30 A M Medical Record Number: [id number] Patient Account Number: [id number] Date of Birth/Sex: Treating RN: February 11, 1966 (56 y.o. Female) Lorrin Jackson Primary Care Felisha Claytor: PA TEL, [place] Other Clinician: Referring Liba Hulsey: Treating Avigdor Dollar/Extender: Caprice Red RD, THO [name] S Weeks in Treatment: 0 Active Problems Location of Pain Severity and Description of Pain Patient Has Paino Yes Site Locations Pain Location: Pain in Ulcers With Dressing Change: Yes Duration of the Pain. Constant / Intermittento Intermittent Rate the pain. Current Pain Level:  5 Character of Pain Describe the Pain: Burning, Throbbing Pain Management and Medication Current Pain Management: Medication: Yes Cold Application: No Rest: Yes Massage: No Activity: No T.E.N.S.: No Heat Application: No Leg drop or elevation: No Is the Current Pain Management Adequate: Adequate How does your wound impact your activities of daily livingo Sleep: Yes Bathing: No Appetite: No Relationship With Others: No Bladder Continence: No Emotions: No Bowel Continence: No Work: No Toileting: No Drive: No Dressing: No Hobbies: No Electronic Signature(s) Signed: 01/26/2022 12:25:37 PM By: Lorrin Jackson Entered By: Lorrin Jackson on 01/26/2022 08:06:43 -------------------------------------------------------------------------------- Patient/Caregiver Education Details Patient Name: Date of Service: [name] 2/17/2023andnbsp7:30 A M Medical Record Number: [id number] Patient Account Number: [id number] Date of Birth/Gender: Treating RN: 1966/11/18 (56 y.o. Female) Lorrin Jackson Primary Care Physician: PA TEL, [place] Other Clinician: Referring Physician: Treating Physician/Extender: Caprice Red RD, THO [name] S Weeks in Treatment: 0 Education Assessment Education Provided To: Patient Education Topics Provided Wound Debridement: Methods: Explain/Verbal Responses: State content correctly Wound/Skin Impairment: Methods: Explain/Verbal, Printed Responses: State content correctly Electronic Signature(s) Signed: 01/26/2022 12:25:37 PM By: Lorrin Jackson Entered By: Lorrin Jackson on 01/26/2022 08:11:30 -------------------------------------------------------------------------------- Wound Assessment Details Patient Name: Date of  Service: [name] D. 01/26/2022 7:30 A M Medical Record Number: [id number] Patient Account Number: [id number] Date of Birth/Sex: Treating RN: 1966/10/08 (56 y.o. Female) Lorrin Jackson Primary Care Damarrion Mimbs: PA TEL,  [place] Other Clinician: Referring Jonuel Butterfield: Treating Diyari Cherne/Extender: Caprice Red RD, THO [name] S Weeks in Treatment: 0 Wound Status Wound Number: 1 Primary Etiology: Dehisced Wound Wound Location: Back Wound Status: Open Wounding Event: Surgical Injury Comorbid History: Hypertension, Type I Diabetes, Neuropathy Date Acquired: 12/14/2021 Weeks Of Treatment: 0 Clustered Wound: No Photos Wound Measurements Length: (cm) 6 Width: (cm) 1.6 Depth: (cm) 0.3 Area: (cm) 7.54 Volume: (cm) 2.262 Wound Description Classification: Full Thickness Without Exposed Support Struct Wound Margin: Well defined, not attached Exudate Amount: Medium Exudate Type: Serosanguineous Exudate Color: red, brown Foul Odor After Cleansing: Slough/Fibrino % Reduction in Area: 0% % Reduction in Volume: 0% Epithelialization: None Tunneling: No Undermining: No ures No Yes Wound Bed Granulation Amount: Medium (34-66%) Exposed Structure Granulation Quality: Red, Pink Fascia Exposed: No Necrotic Amount: Medium (34-66%) Fat Layer (Subcutaneous Tissue) Exposed: Yes Necrotic Quality: Adherent Slough Tendon Exposed: No Muscle Exposed: No Joint Exposed: No Bone Exposed: No Treatment Notes Wound #1 (Back) Cleanser Soap and Water Discharge Instruction: May shower and wash wound with dial antibacterial soap and water prior to dressing change. Wound Cleanser Discharge Instruction: Cleanse the wound with wound cleanser prior to applying a clean dressing using gauze sponges, not tissue or cotton balls. Peri-Wound Care Skin Prep Discharge Instruction: Use skin prep as directed Topical Primary Dressing Santyl Ointment Discharge Instruction: Apply nickel thick amount to wound bed as instructed Secondary Dressing Woven Gauze Sponges 2x2 in Discharge Instruction: Apply over primary dressing as directed. Zetuvit Plus Silicone Border Dressing 7x7(in/in) Discharge Instruction: Apply silicone  border over primary dressing as directed. Secured With Compression Wrap Compression Stockings Environmental education officer) Signed: 01/26/2022 12:25:37 PM By: Lorrin Jackson Entered By: Lorrin Jackson on 01/26/2022 08:05:16 -------------------------------------------------------------------------------- Vitals Details Patient Name: Date of Service: [name] Desiree Lucy D. 01/26/2022 7:30 A M Medical Record Number: [id number] Patient Account Number: [id number] Date of Birth/Sex: Treating RN: 1966/06/12 (56 y.o. Female) Lorrin Jackson Primary Care Alyx Gee: PA TEL, [place] Other Clinician: Referring Rodrick Payson: Treating Jalie Eiland/Extender: Caprice Red RD, THO [name] S Weeks in Treatment: 0 Vital Signs Time Taken: 07:47 Temperature (F): 98.4 Height (in): 58 Pulse (bpm): 81 Source: Stated Respiratory Rate (breaths/min): 16 Weight (lbs): 194 Blood Pressure (mmHg): 153/82 Source: Stated Capillary Blood Glucose (mg/dl): 140 Body Mass Index (BMI): 40.5 Reference Range: 80 - 120 mg / dl Electronic Signature(s) Signed: 01/26/2022 12:25:37 PM By: Lorrin Jackson Entered By: Lorrin Jackson on 01/26/2022 07:48:25

## 2022-01-30 ENCOUNTER — Ambulatory Visit: Payer: No Typology Code available for payment source | Admitting: *Deleted

## 2022-01-30 DIAGNOSIS — I10 Essential (primary) hypertension: Secondary | ICD-10-CM

## 2022-01-30 DIAGNOSIS — Z794 Long term (current) use of insulin: Secondary | ICD-10-CM

## 2022-01-30 DIAGNOSIS — E1169 Type 2 diabetes mellitus with other specified complication: Secondary | ICD-10-CM

## 2022-01-30 NOTE — Chronic Care Management (AMB) (Signed)
Care Management    RN Visit Note  01/30/2022 Name: [name] MRN: [id number] DOB: 02-18-1966  Subjective: [name] is a 56 y.o. year old female who is a primary care patient of Lindell Spar, MD. The care management team was consulted for assistance with disease management and care coordination needs.    Engaged with patient by telephone for follow up visit in response to provider referral for case management and/or care coordination services.   Consent to Services:   Ms. Cravey was given information about Care Management services today including:  Care Management services includes personalized support from designated clinical staff supervised by her physician, including individualized plan of care and coordination with other care providers 24/7 contact phone numbers for assistance for urgent and routine care needs. The patient may stop case management services at any time by phone call to the office staff.  Patient agreed to services and consent obtained.   Assessment: Review of patient past medical history, allergies, medications, health status, including review of consultants reports, laboratory and other test data, was performed as part of comprehensive evaluation and provision of chronic care management services.   SDOH (Social Determinants of Health) assessments and interventions performed:    Care Plan  Allergies  Allergen Reactions   Ciprofloxacin Hives and Swelling   Penicillins Anaphylaxis and Rash   Codeine Other (See Comments)    had hallicunations   Morphine Nausea And Vomiting and Other (See Comments)    recieved in ED due to [facility] and had multple doses - gave visual hallucinations and vomitting.   Sulfonamide Derivatives Other (See Comments)    REACTION: Unsure - childhood allergy   Vancomycin Itching    Outpatient Encounter Medications as of 01/30/2022  Medication Sig Note   albuterol (VENTOLIN HFA) 108 (90 Base) MCG/ACT inhaler Inhale 2 puffs  into the lungs every 6 (six) hours as needed for wheezing or shortness of breath.     blood glucose meter kit and supplies Use up to four times daily as directed.    buPROPion (WELLBUTRIN XL) 150 MG 24 hr tablet Take 150 mg by mouth daily.    cyclobenzaprine (FLEXERIL) 10 MG tablet Take 1 tablet (10 mg total) by mouth 3 (three) times daily as needed for muscle spasms.    furosemide (LASIX) 20 MG tablet Take 20 mg by mouth daily. Take 1-2 tablets daily    gabapentin (NEURONTIN) 600 MG tablet Take 1 tablet (600 mg total) by mouth 3 (three) times daily.    glucose blood test strip Test up to 4 times a day    insulin lispro (HUMALOG) 100 UNIT/ML injection Inject 80 units under the skin via omnipod daily 12/05/2021: Continuous insulin pump   Lancets (FREESTYLE) lancets Use to test 4 times daily    lisinopril-hydrochlorothiazide (ZESTORETIC) 20-12.5 MG tablet Take 1 tablet by mouth daily.    ondansetron (ZOFRAN) 4 MG tablet Take 1 tablet (4 mg total) by mouth every 8 (eight) hours as needed for nausea or vomiting.    oxybutynin (DITROPAN-XL) 5 MG 24 hr tablet Take 5 mg by mouth at bedtime.    oxyCODONE (OXY IR/ROXICODONE) 5 MG immediate release tablet Take 1 tablet (5 mg total) by mouth every 4 (four) hours as needed (pain).    Potassium Citrate (UROCIT-K 15) 15 MEQ (1620 MG) TBCR Take 1 tablet by mouth in the morning and at bedtime.    simvastatin (ZOCOR) 40 MG tablet Take 40 mg by mouth daily.  No facility-administered encounter medications on file as of 01/30/2022.    Patient Active Problem List   Diagnosis Date Noted   ASCUS of cervix with negative high risk HPV 01/16/2022   Postmenopausal 01/11/2022   Encounter for screening fecal occult blood testing 01/11/2022   Encounter for gynecological examination with Papanicolaou smear of cervix 01/11/2022   Epidural hematoma 12/14/2021   Spondylolisthesis of lumbar region 12/05/2021   Spinal stenosis of lumbar region with neurogenic claudication  10/25/2021   Chronic bilateral low back pain with left-sided sciatica 10/18/2021   Constipation 01/18/2021   GERD (gastroesophageal reflux disease) 03/09/2020   Other intervertebral disc degeneration, lumbar region 05/24/2019   OSA (obstructive sleep apnea) 12/08/2014   Depression 07/15/2013   Dyspnea 11/12/2012   Headache(784.0) 10/14/2012   Neuropathy 10/14/2012   Essential hypertension, benign 05/10/2012   Diabetes mellitus ([place]) 09/29/2008   Mixed hyperlipidemia 09/29/2008   Morbid obesity due to excess calories ([place]) 08/31/2008    Conditions to be addressed/monitored: HTN and DMII  Care Plan : RN Care Manager Plan of Care  Updates made by Kassie Mends, RN since 01/30/2022 12:00 AM     Problem: No plan of care established for management of chronic disease states  (DM2, HTN, Chronic pain)   Priority: High     Long-Range Goal: Development of plan of care for chronic disease management  (DM2, HTN, Chronic pain)   Start Date: 12/26/2021  Expected End Date: 06/24/2022  Priority: High  Note:   Current Barriers:  Knowledge Deficits related to plan of care for management of HTN and DMII  Patient reports she lives with spouse and was working full time and independent in all aspects of her care until she recently had 2 back surgeries, pt reports she has walker and uses as needed mainly when leaving home.  Pt reports she checks CBG TID with fasting ranges 127-142 and random ranges 120-140 with 2 readings above 200.  Pt reports she will be meeting with endocrinologist about "coming off insulin pump since [facility] is much better" and is still on pump for now and will follow up in March and have [facility] checked again.  Pt reports she has a blood pressure cuff but does not currently check blood pressure. Pt is now following up with wound care - Kalman Shan due to back incision "opened up and we are changing dressing at home"  Patient reports "pain much better than it was" Patient did receive  advanced directives and has not yet completed  RNCM Clinical Goal(s):  Patient will verbalize understanding of plan for management of HTN and DMII as evidenced by patient report, review of EHR and  through collaboration with RN Care manager, provider, and care team.   Interventions: 1:1 collaboration with primary care provider regarding development and update of comprehensive plan of care as evidenced by provider attestation and co-signature Inter-disciplinary care team collaboration (see longitudinal plan of care) Evaluation of current treatment plan related to  self management and patient's adherence to plan as established by provider  Diabetes Interventions:  (Status:  New goal. and Goal on track:  Yes.) Long Term Goal Assessed patient's understanding of A1c goal: <7% Provided education to patient about basic DM disease process Reviewed medications with patient and discussed importance of medication adherence Review of patient status, including review of consultants reports, relevant laboratory and other test results, and medications completed Reinforced carbohydrate modified diet and importance of avoiding concentrated sweets Reinforced pain management strategies and keeping stress to a  minimum Reviewed importance of adequate protein in diet for wound healing Reviewed correlation of elevated blood sugar and delayed wound healing Reviewed importance of good handwashing before and after with dressing changes Lab Results  Component Value Date   HGBA1C 5.7 (H) 11/30/2021   Hypertension Interventions:  (Status:  New goal. and Goal on track:  Yes.) Long Term Goal Last practice recorded BP readings:  BP Readings from Last 3 Encounters:  12/15/21 (!) 107/48  12/06/21 (!) 109/51  12/01/21 132/76  Most recent eGFR/CrCl:  Lab Results  Component Value Date   EGFR 104 11/30/2021    No components found for: CRCL  Reviewed medications with patient and discussed importance of  compliance Advised patient, providing education and rationale, to monitor blood pressure daily and record, calling PCP for findings outside established parameters  Pain assessment completed Confirmed pt did receive advanced directives packet Reviewed upcoming scheduled appointments including primary care provider and wound care Reinforced low sodium diet Pain assessment completed  Patient Goals/Self-Care Activities: Take medications as prescribed   Attend all scheduled provider appointments Call provider office for new concerns or questions  check blood sugar at prescribed times: three times daily take the blood sugar log to all doctor visits take the blood sugar meter to all doctor visits drink 6 to 8 glasses of water each day fill half of plate with vegetables wash and dry feet carefully every day check blood pressure weekly choose a place to take my blood pressure (home, clinic or office, retail store) write blood pressure results in a log or diary take blood pressure log to all doctor appointments take medications for blood pressure exactly as prescribed eat more whole grains, fruits and vegetables, lean meats and healthy fats Complete advanced directives Follow low sodium diet- limit fast food, read labels Take pain medication as prescribed, practice relaxation and keep stress to a minimum Wash your hands well before and after wound care Eat adequate protein in your diet for wound healing       Plan: Telephone follow up appointment with care management team member scheduled for:  03/13/22  Jacqlyn Larsen [facility], BSN RN Case Manager [facility] [phone]

## 2022-01-30 NOTE — Chronic Care Management (AMB) (Deleted)
Chronic Care Management   CCM RN Visit Note  01/30/2022 Name: [name] MRN: [id number] DOB: [birth year]  Subjective: [name] is a 56 y.o. year old female who is a primary care patient of Lindell Spar, MD. The care management team was consulted for assistance with disease management and care coordination needs.    Engaged with patient by telephone for follow up visit in response to provider referral for case management and/or care coordination services.   Consent to Services:  The patient was given information about Chronic Care Management services, agreed to services, and gave verbal consent prior to initiation of services.  Please see initial visit note for detailed documentation.   Patient agreed to services and verbal consent obtained.   Assessment: Review of patient past medical history, allergies, medications, health status, including review of consultants reports, laboratory and other test data, was performed as part of comprehensive evaluation and provision of chronic care management services.   SDOH (Social Determinants of Health) assessments and interventions performed:    CCM Care Plan  Allergies  Allergen Reactions   Ciprofloxacin Hives and Swelling   Penicillins Anaphylaxis and Rash   Codeine Other (See Comments)    had hallicunations   Morphine Nausea And Vomiting and Other (See Comments)    recieved in ED due to [facility] and had multple doses - gave visual hallucinations and vomitting.   Sulfonamide Derivatives Other (See Comments)    REACTION: Unsure - childhood allergy   Vancomycin Itching    Outpatient Encounter Medications as of 01/30/2022  Medication Sig Note   albuterol (VENTOLIN HFA) 108 (90 Base) MCG/ACT inhaler Inhale 2 puffs into the lungs every 6 (six) hours as needed for wheezing or shortness of breath.     blood glucose meter kit and supplies Use up to four times daily as directed.    buPROPion (WELLBUTRIN XL) 150 MG 24 hr tablet Take  150 mg by mouth daily.    cyclobenzaprine (FLEXERIL) 10 MG tablet Take 1 tablet (10 mg total) by mouth 3 (three) times daily as needed for muscle spasms.    furosemide (LASIX) 20 MG tablet Take 20 mg by mouth daily. Take 1-2 tablets daily    gabapentin (NEURONTIN) 600 MG tablet Take 1 tablet (600 mg total) by mouth 3 (three) times daily.    glucose blood test strip Test up to 4 times a day    insulin lispro (HUMALOG) 100 UNIT/ML injection Inject 80 units under the skin via omnipod daily 12/05/2021: Continuous insulin pump   Lancets (FREESTYLE) lancets Use to test 4 times daily    lisinopril-hydrochlorothiazide (ZESTORETIC) 20-12.5 MG tablet Take 1 tablet by mouth daily.    ondansetron (ZOFRAN) 4 MG tablet Take 1 tablet (4 mg total) by mouth every 8 (eight) hours as needed for nausea or vomiting.    oxybutynin (DITROPAN-XL) 5 MG 24 hr tablet Take 5 mg by mouth at bedtime.    oxyCODONE (OXY IR/ROXICODONE) 5 MG immediate release tablet Take 1 tablet (5 mg total) by mouth every 4 (four) hours as needed (pain).    Potassium Citrate (UROCIT-K 15) 15 MEQ (1620 MG) TBCR Take 1 tablet by mouth in the morning and at bedtime.    simvastatin (ZOCOR) 40 MG tablet Take 40 mg by mouth daily.     No facility-administered encounter medications on file as of 01/30/2022.    Patient Active Problem List   Diagnosis Date Noted   ASCUS of cervix with negative high risk HPV  01/16/2022   Postmenopausal 01/11/2022   Encounter for screening fecal occult blood testing 01/11/2022   Encounter for gynecological examination with Papanicolaou smear of cervix 01/11/2022   Epidural hematoma 12/14/2021   Spondylolisthesis of lumbar region 12/05/2021   Spinal stenosis of lumbar region with neurogenic claudication 10/25/2021   Chronic bilateral low back pain with left-sided sciatica 10/18/2021   Constipation 01/18/2021   GERD (gastroesophageal reflux disease) 03/09/2020   Other intervertebral disc degeneration, lumbar region  05/24/2019   OSA (obstructive sleep apnea) 12/08/2014   Depression 07/15/2013   Dyspnea 11/12/2012   Headache(784.0) 10/14/2012   Neuropathy 10/14/2012   Essential hypertension, benign 05/10/2012   Diabetes mellitus ([place]) 09/29/2008   Mixed hyperlipidemia 09/29/2008   Morbid obesity due to excess calories ([place]) 08/31/2008    Conditions to be addressed/monitored:HTN and DMII  Care Plan : RN Care Manager Plan of Care  Updates made by Kassie Mends, RN since 01/30/2022 12:00 AM     Problem: No plan of care established for management of chronic disease states  (DM2, HTN, Chronic pain)   Priority: High     Long-Range Goal: Development of plan of care for chronic disease management  (DM2, HTN, Chronic pain)   Start Date: 12/26/2021  Expected End Date: 06/24/2022  Priority: High  Note:   Current Barriers:  Knowledge Deficits related to plan of care for management of HTN and DMII  Patient reports she lives with spouse and was working full time and independent in all aspects of her care until she recently had 2 back surgeries, pt reports she has walker and uses as needed mainly when leaving home.  Pt reports she checks CBG TID with fasting ranges 127-142 and random ranges 120-140 with 2 readings above 200.  Pt reports she will be meeting with endocrinologist about "coming off insulin pump since [facility] is much better" and is still on pump for now and will follow up in March and have [facility] checked again.  Pt reports she has a blood pressure cuff but does not currently check blood pressure. Pt is now following up with wound care - Kalman Shan due to back incision "opened up and we are changing dressing at home"  Patient reports "pain much better than it was" Patient did receive advanced directives and has not yet completed  RNCM Clinical Goal(s):  Patient will verbalize understanding of plan for management of HTN and DMII as evidenced by patient report, review of EHR and  through collaboration  with RN Care manager, provider, and care team.   Interventions: 1:1 collaboration with primary care provider regarding development and update of comprehensive plan of care as evidenced by provider attestation and co-signature Inter-disciplinary care team collaboration (see longitudinal plan of care) Evaluation of current treatment plan related to  self management and patient's adherence to plan as established by provider  Diabetes Interventions:  (Status:  New goal. and Goal on track:  Yes.) Long Term Goal Assessed patient's understanding of A1c goal: <7% Provided education to patient about basic DM disease process Reviewed medications with patient and discussed importance of medication adherence Review of patient status, including review of consultants reports, relevant laboratory and other test results, and medications completed Reinforced carbohydrate modified diet and importance of avoiding concentrated sweets Reinforced pain management strategies and keeping stress to a minimum Reviewed importance of adequate protein in diet for wound healing Reviewed correlation of elevated blood sugar and delayed wound healing Reviewed importance of good handwashing before and after with dressing changes  Lab Results  Component Value Date   HGBA1C 5.7 (H) 11/30/2021   Hypertension Interventions:  (Status:  New goal. and Goal on track:  Yes.) Long Term Goal Last practice recorded BP readings:  BP Readings from Last 3 Encounters:  12/15/21 (!) 107/48  12/06/21 (!) 109/51  12/01/21 132/76  Most recent eGFR/CrCl:  Lab Results  Component Value Date   EGFR 104 11/30/2021    No components found for: CRCL  Reviewed medications with patient and discussed importance of compliance Advised patient, providing education and rationale, to monitor blood pressure daily and record, calling PCP for findings outside established parameters  Pain assessment completed Confirmed pt did receive advanced directives  packet Reviewed upcoming scheduled appointments including primary care provider and wound care Reinforced low sodium diet Pain assessment completed  Patient Goals/Self-Care Activities: Take medications as prescribed   Attend all scheduled provider appointments Call provider office for new concerns or questions  check blood sugar at prescribed times: three times daily take the blood sugar log to all doctor visits take the blood sugar meter to all doctor visits drink 6 to 8 glasses of water each day fill half of plate with vegetables wash and dry feet carefully every day check blood pressure weekly choose a place to take my blood pressure (home, clinic or office, retail store) write blood pressure results in a log or diary take blood pressure log to all doctor appointments take medications for blood pressure exactly as prescribed eat more whole grains, fruits and vegetables, lean meats and healthy fats Complete advanced directives Follow low sodium diet- limit fast food, read labels Take pain medication as prescribed, practice relaxation and keep stress to a minimum Wash your hands well before and after wound care Eat adequate protein in your diet for wound healing       Plan:Telephone follow up appointment with care management team member scheduled for:  03/13/22  Jacqlyn Larsen [facility], BSN RN Case Manager [facility] [phone]

## 2022-01-30 NOTE — Patient Instructions (Signed)
Visit Information  Thank you for taking time to visit with me today. Please don't hesitate to contact me if I can be of assistance to you before our next scheduled telephone appointment.  Following are the goals we discussed today:  Take medications as prescribed   Attend all scheduled provider appointments Call provider office for new concerns or questions  check blood sugar at prescribed times: three times daily take the blood sugar log to all doctor visits take the blood sugar meter to all doctor visits drink 6 to 8 glasses of water each day fill half of plate with vegetables wash and dry feet carefully every day check blood pressure weekly choose a place to take my blood pressure (home, clinic or office, retail store) write blood pressure results in a log or diary take blood pressure log to all doctor appointments take medications for blood pressure exactly as prescribed eat more whole grains, fruits and vegetables, lean meats and healthy fats Complete advanced directives Follow low sodium diet- limit fast food, read labels Take pain medication as prescribed, practice relaxation and keep stress to a minimum Wash your hands well before and after wound care Eat adequate protein in your diet for wound healing  Our next appointment is by telephone on 03/13/22 at 130 pm  Please call the care guide team at [phone] if you need to cancel or reschedule your appointment.   If you are experiencing a Mental Health or Behavioral Health Crisis or need someone to talk to, please call the [place] National Suicide Prevention Lifeline: 508-488-2408 or TTY: 980-295-1016 TTY (667)101-3190) to talk to a trained counselor call 1-800-273-TALK (toll free, 24 hour hotline) go to [facility] [address] [place] [phone]) call the [facility] Crisis Line: [phone] call 911   Patient verbalizes understanding of instructions and care plan  provided today and agrees to view in MyChart. Active MyChart status confirmed with patient.    Telephone follow up appointment with care management team member scheduled for:  03/13/22  Irving Shows [facility], BSN RN Case Manager [facility] [phone]

## 2022-02-02 ENCOUNTER — Encounter (HOSPITAL_BASED_OUTPATIENT_CLINIC_OR_DEPARTMENT_OTHER): Payer: No Typology Code available for payment source | Admitting: Internal Medicine

## 2022-02-02 DIAGNOSIS — L98428 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: Secondary | ICD-10-CM | POA: Diagnosis not present

## 2022-02-02 DIAGNOSIS — E11622 Type 2 diabetes mellitus with other skin ulcer: Secondary | ICD-10-CM | POA: Diagnosis not present

## 2022-02-06 ENCOUNTER — Telehealth: Payer: No Typology Code available for payment source

## 2022-02-07 NOTE — Progress Notes (Signed)
[name], [name] ([id number]) Visit Report for 02/02/2022 Chief Complaint Document Details Patient Name: Date of Service: [name], [name] 02/02/2022 10:00 A M Medical Record Number: [id number] Patient Account Number: [id number] Date of Birth/Sex: Treating RN: 1966-05-02 (56 y.o. Female) Primary Care Provider: PA TEL, RUTWIK Other Clinician: Referring Provider: Treating Provider/Extender: [name], [name] Weeks in Treatment: 1 Information Obtained from: Patient Chief Complaint Open wound to the back secondary to surgical wound dehiscence Electronic Signature(s) Signed: 02/02/2022 10:52:29 AM By: [name] Entered By: Geralyn Corwin on 02/02/2022 10:31:33 -------------------------------------------------------------------------------- Debridement Details Patient Name: Date of Service: [name] Oxford D. 02/02/2022 10:00 A M Medical Record Number: [id number] Patient Account Number: [id number] Date of Birth/Sex: Treating RN: 05/29/1966 (56 y.o. Female) Fonnie Mu Primary Care Provider: PA TEL, RUTWIK Other Clinician: Referring Provider: Treating Provider/Extender: [name], [name] Weeks in Treatment: 1 Debridement Performed for Assessment: Wound #1 Back Performed By: Physician Geralyn Corwin, DO Debridement Type: Debridement Level of Consciousness (Pre-procedure): Awake and Alert Pre-procedure Verification/Time Out Yes - 10:15 Taken: Start Time: 10:15 Pain Control: Lidocaine T Area Debrided (L x W): otal 5.5 (cm) x 1.2 (cm) = 6.6 (cm) Tissue and other material debrided: Viable, Non-Viable, Slough, Subcutaneous, Slough Level: Skin/Subcutaneous Tissue Debridement Description: Excisional Instrument: Curette Bleeding: Minimum Hemostasis Achieved: Pressure End Time: 10:15 Procedural Pain: 0 Post Procedural Pain: 0 Response to Treatment: Procedure was tolerated well Level of Consciousness (Post- Awake and  Alert procedure): Post Debridement Measurements of Total Wound Length: (cm) 5.5 Width: (cm) 1.2 Depth: (cm) 1 Volume: (cm) 5.184 Character of Wound/Ulcer Post Debridement: Improved Post Procedure Diagnosis Same as Pre-procedure Electronic Signature(s) Signed: 02/02/2022 10:52:29 AM By: [name] Signed: 02/07/2022 3:37:08 PM By: [name] Entered By: Fonnie Mu on 02/02/2022 10:35:37 -------------------------------------------------------------------------------- HPI Details Patient Name: Date of Service: [name] Oxford D. 02/02/2022 10:00 A M Medical Record Number: [id number] Patient Account Number: [id number] Date of Birth/Sex: Treating RN: [birth year] (56 y.o. Female) Primary Care Provider: PA TEL, RUTWIK Other Clinician: Referring Provider: Treating Provider/Extender: [name], [name] Weeks in Treatment: 1 History of Present Illness HPI Description: 01/26/2022 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 She has a past medical history of type 2 diabetes currently controlled on an insulin pump, morbid obesity, spinal stenosis and OSA that presents to the clinic for a 1-7-month history of nonhealing wound to her back. She had surgery on 12/05/2021 with Dr. Maurice Small for spondylolisthesis with lumbar radiculopathy and had an L5-S1 open TLIF and posterior lateral instrumented fusion. She subsequently developed a hematoma and had to have this evacuated on 12/14/2021. She developed wound dehiscence after the surgery and has an open wound to her back. She has not been doing any wound care to the area. She does not even keep this covered. She currently denies signs of infection. She has some chronic pain to the area. 2/24; patient presents for follow-up. She could not obtain Santyl due to cost. She started Medihoney. She has no issues or complaints today. She denies signs of infection. Electronic  Signature(s) Signed: 02/02/2022 10:52:29 AM By: [name] Entered By: Geralyn Corwin on 02/02/2022 10:32:43 -------------------------------------------------------------------------------- Physical Exam Details Patient Name: Date of Service: [name] Oxford D. 02/02/2022 10:00 A M Medical Record Number: [id number] Patient Account Number: [id number] Date of Birth/Sex: Treating RN: 07/10/1966 (56 y.o. Female) Primary Care Provider: PA TEL, RUTWIK Other  Clinician: Referring Provider: Treating Provider/Extender: [name], [name] Weeks in Treatment: 1 Constitutional respirations regular, non-labored and within target range for patient.Marland Kitchen Psychiatric pleasant and cooperative. Notes Back: T the lumbar region she has an elongated wound along a previous surgical incision site. There is granulation tissue present along with nonviable tissue. o Most of the nonviable tissue was tightly adhered. Mild tenderness on palpation. No drainage noted. No surrounding signs of infection. Electronic Signature(s) Signed: 02/02/2022 10:52:29 AM By: [name] Entered By: Geralyn Corwin on 02/02/2022 10:34:10 -------------------------------------------------------------------------------- Physician Orders Details Patient Name: Date of Service: [name] Oxford D. 02/02/2022 10:00 A M Medical Record Number: [id number] Patient Account Number: [id number] Date of Birth/Sex: Treating RN: [birth year] (56 y.o. Female) Fonnie Mu Primary Care Provider: PA TEL, RUTWIK Other Clinician: Referring Provider: Treating Provider/Extender: [name], [name] Weeks in Treatment: 1 Verbal / Phone Orders: No Diagnosis Coding Follow-up Appointments ppointment in 1 week. - with Dr. Mikey Bussing (Room 7, [place]) Return A Other: - Prescription for [employer] sent to pharmacy, expect call from them when ready.=Patient could not afford Bathing/ Shower/ Hygiene May shower and  wash wound with soap and water. - when changing dressing Additional Orders / Instructions Follow Nutritious Diet - Continue to monitor/control Blood Sugar Other: - [facility]=Supplies Wound Treatment Wound #1 - Back Cleanser: Soap and Water 1 x Per Day/30 Days Discharge Instructions: May shower and wash wound with dial antibacterial soap and water prior to dressing change. Cleanser: Wound Cleanser (Generic) 1 x Per Day/30 Days Discharge Instructions: Cleanse the wound with wound cleanser prior to applying a clean dressing using gauze sponges, not tissue or cotton balls. Peri-Wound Care: Skin Prep (Generic) 1 x Per Day/30 Days Discharge Instructions: Use skin prep as directed Prim Dressing: MediHoney Gel, tube 1.5 (oz) 1 x Per Day/30 Days ary Discharge Instructions: Apply to wound bed as instructed Secondary Dressing: Woven Gauze Sponges 2x2 in (Generic) 1 x Per Day/30 Days Discharge Instructions: Apply over primary dressing as directed. Secondary Dressing: Zetuvit Plus Silicone Border Dressing 7x7(in/in) (Generic) 1 x Per Day/30 Days Discharge Instructions: Apply silicone border over primary dressing as directed. Wound #2 - Back Wound Laterality: Distal Cleanser: Soap and Water 1 x Per Day/30 Days Discharge Instructions: May shower and wash wound with dial antibacterial soap and water prior to dressing change. Cleanser: Wound Cleanser (Generic) 1 x Per Day/30 Days Discharge Instructions: Cleanse the wound with wound cleanser prior to applying a clean dressing using gauze sponges, not tissue or cotton balls. Peri-Wound Care: Skin Prep (Generic) 1 x Per Day/30 Days Discharge Instructions: Use skin prep as directed Prim Dressing: MediHoney Gel, tube 1.5 (oz) 1 x Per Day/30 Days ary Discharge Instructions: Apply to wound bed as instructed Secondary Dressing: Woven Gauze Sponges 2x2 in (Generic) 1 x Per Day/30 Days Discharge Instructions: Apply over primary dressing as directed. Secondary  Dressing: Zetuvit Plus Silicone Border Dressing 7x7(in/in) (Generic) 1 x Per Day/30 Days Discharge Instructions: Apply silicone border over primary dressing as directed. Electronic Signature(s) Signed: 02/02/2022 10:52:29 AM By: [name] Entered By: Geralyn Corwin on 02/02/2022 10:34:29 -------------------------------------------------------------------------------- Problem List Details Patient Name: Date of Service: [name] Oxford D. 02/02/2022 10:00 A M Medical Record Number: [id number] Patient Account Number: [id number] Date of Birth/Sex: Treating RN: 25-Mar-1966 (56 y.o. 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 01/26/2022 No Yes T81.31XA Disruption of external operation (surgical) wound, not elsewhere classified, 01/26/2022 No Yes initial encounter E11.622 Type 2 diabetes mellitus with other skin ulcer 01/26/2022 No Yes M48.062 Spinal stenosis, lumbar region with neurogenic claudication 01/26/2022 No Yes Inactive Problems Resolved Problems Electronic Signature(s) Signed: 02/02/2022 10:52:29 AM By: Geralyn CorwinHoffman, Bufford Helms DO Entered By: Geralyn CorwinHoffman, Amaranta Mehl on 02/02/2022 10:30:41 -------------------------------------------------------------------------------- Progress Note Details Patient Name: Date of Service: [name], Hildegard D. 02/02/2022 10:00 A M Medical Record Number: [id number] Patient Account Number: [id number] Date of Birth/Sex: Treating RN: 01/26/1966 (56 y.o. Female) Primary Care Provider: PA TEL, RUTWIK Other Clinician: Referring Provider: Treating Provider/Extender: Hansel StarlingHoffman, Daney Moor O STERGA RD, [name] Weeks in Treatment: 1 Subjective Chief Complaint Information obtained from Patient Open wound  to the back secondary to surgical wound dehiscence History of Present Illness (HPI) 01/26/2022 Ms. 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 She has a past medical history of type 2 diabetes currently controlled on an insulin pump, morbid obesity, spinal stenosis and OSA that presents to the clinic for a 12-2536-month history of nonhealing wound to her back. She had surgery on 12/05/2021 with Dr. Maurice Smallstergard for spondylolisthesis with lumbar radiculopathy and had an L5-S1 open TLIF and posterior lateral instrumented fusion. She subsequently developed a hematoma and had to have this evacuated on 12/14/2021. She developed wound dehiscence after the surgery and has an open wound to her back. She has not been doing any wound care to the area. She does not even keep this covered. She currently denies signs of infection. She has some chronic pain to the area. 2/24; patient presents for follow-up. She could not obtain Santyl due to cost. She started Medihoney. She has no issues or complaints today. She denies signs of infection. Patient History Information obtained from Patient. Family History Cancer - Paternal Grandparents, Diabetes - Mother,Father, Heart Disease - Paternal Grandparents,Maternal Grandparents, Hypertension - Maternal Grandparents,Paternal Grandparents, Stroke - Mother,Maternal Grandparents, No family history of Hereditary Spherocytosis, Kidney Disease, Lung Disease, Seizures, Thyroid Problems, Tuberculosis. Social History Never smoker, Marital Status - Married, Alcohol Use - Never, Drug Use - No History, Caffeine Use - Moderate. Medical History Cardiovascular Patient has history of Hypertension Endocrine Patient has history of Type I Diabetes Neurologic Patient has history of Neuropathy Medical A Surgical History Notes nd Musculoskeletal Spinal Stenosis-Surgery 12/05/21 Objective Constitutional respirations regular, non-labored and within target  range for patient.. Vitals Time Taken: 9:55 AM, Height: 58 in, Weight: 194 lbs, BMI: 40.5, Temperature: 98.2 F, Pulse: 74 bpm, Respiratory Rate: 17 breaths/min, Blood Pressure: 153/95 mmHg, Capillary Blood Glucose: 201 mg/dl. Psychiatric pleasant and cooperative. General Notes: Back: T the lumbar region she has an elongated wound along a previous surgical incision site. There is granulation tissue present along with o nonviable tissue. Most of the nonviable tissue was tightly adhered. Mild tenderness on palpation. No drainage noted. No surrounding signs of infection. Integumentary (Hair, Skin) Wound #1 status is Open. Original cause of wound was Surgical Injury. The date acquired was: 12/14/2021. The wound has been in treatment 1 weeks. The wound is located on the Back. The wound measures 5.5cm length x 1.2cm width x 1cm depth; 5.184cm^2 area and 5.184cm^3 volume. There is Fat Layer (Subcutaneous Tissue) exposed. There is no tunneling or undermining noted. There is a medium amount of serosanguineous drainage noted. The wound margin is well defined and not attached  to the wound base. There is medium (34-66%) red, pink granulation within the wound bed. There is a medium (34-66%) amount of necrotic tissue within the wound bed including Adherent Slough. Wound #2 status is Open. Original cause of wound was Surgical Injury. The date acquired was: 02/02/2022. The wound is located on the Distal Back. The wound measures 0.2cm length x 0.2cm width x 0.2cm depth; 0.031cm^2 area and 0.006cm^3 volume. There is Fat Layer (Subcutaneous Tissue) exposed. There is no tunneling or undermining noted. There is a medium amount of serosanguineous drainage noted. The wound margin is distinct with the outline attached to the wound base. There is large (67-100%) red, pink granulation within the wound bed. There is a small (1-33%) amount of necrotic tissue within the wound bed including Adherent Slough. 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 There is increased depth in the center. I attempted to debride nonviable tissue however this was very tightly adhered. No signs of surrounding infection. I recommended continuing with Medihoney. Plan Follow-up Appointments: Return Appointment in 1 week. - with Dr. Mikey Bussing (Room 7, [place]) Other: - Prescription for [facility] sent to pharmacy, expect call from them when ready.=Patient could not afford Bathing/ Shower/ Hygiene: May shower and wash wound with soap and water. - when changing dressing Additional Orders / Instructions: Follow Nutritious Diet - Continue to monitor/control Blood Sugar Other: - [facility]=Supplies WOUND #1: - Back Wound Laterality: Cleanser: Soap and Water 1 x Per Day/30 Days Discharge Instructions: May shower and wash wound with dial antibacterial soap and water prior to dressing change. Cleanser: Wound Cleanser (Generic) 1 x Per Day/30 Days Discharge Instructions: Cleanse the wound with wound cleanser prior to applying a clean dressing using gauze sponges, not tissue or cotton balls. Peri-Wound Care: Skin Prep (Generic) 1 x Per Day/30 Days Discharge Instructions: Use skin prep as directed Prim Dressing: MediHoney Gel, tube 1.5 (oz) 1 x Per Day/30 Days ary Discharge Instructions: Apply to wound bed as instructed Secondary Dressing: Woven Gauze Sponges 2x2 in (Generic) 1 x Per Day/30 Days Discharge Instructions: Apply over primary dressing as directed. Secondary Dressing: Zetuvit Plus Silicone Border Dressing 7x7(in/in) (Generic) 1 x Per Day/30 Days Discharge Instructions: Apply silicone border over primary dressing as directed. WOUND #2: - Back Wound Laterality:  Distal Cleanser: Soap and Water 1 x Per Day/30 Days Discharge Instructions: May shower and wash wound with dial antibacterial soap and water prior to dressing change. Cleanser: Wound Cleanser (Generic) 1 x Per Day/30 Days Discharge Instructions: Cleanse the wound with wound cleanser prior to applying a clean dressing using gauze sponges, not tissue or cotton balls. Peri-Wound Care: Skin Prep (Generic) 1 x Per Day/30 Days Discharge Instructions: Use skin prep as directed Prim Dressing: MediHoney Gel, tube 1.5 (oz) 1 x Per Day/30 Days ary Discharge Instructions: Apply to wound bed as instructed Secondary Dressing: Woven Gauze Sponges 2x2 in (Generic) 1 x Per Day/30 Days Discharge Instructions: Apply over primary dressing as directed. Secondary Dressing: Zetuvit Plus Silicone Border Dressing 7x7(in/in) (Generic) 1 x Per Day/30 Days Discharge Instructions: Apply silicone border over primary dressing as directed. 1. In office sharp debridement 2. Medihoney 3. Follow-up in 1 week Electronic Signature(s) Signed: 02/02/2022 10:52:29 AM By: [name] Entered By: Geralyn Corwin on 02/02/2022 10:51:35 -------------------------------------------------------------------------------- HxROS Details Patient Name:  Date of Service: [place] TALLIA, MOEHRING 02/02/2022 10:00 A M Medical Record Number: [id number] Patient Account Number: [id number] Date of Birth/Sex: Treating RN: [birth year] (56 y.o. Female) Primary Care Provider: PA TEL, RUTWIK Other Clinician: Referring Provider: Treating Provider/Extender: [name], [name] Weeks in Treatment: 1 Information Obtained From Patient Cardiovascular Medical History: Positive for: Hypertension Endocrine Medical History: Positive for: Type I Diabetes Time with diabetes: 40 years Treated with: Insulin Blood sugar tested every day: Yes Tested : 3x day Musculoskeletal Medical History: Past Medical History Notes: Spinal  Stenosis-Surgery 12/05/21 Neurologic Medical History: Positive for: Neuropathy Immunizations Pneumococcal Vaccine: Received Pneumococcal Vaccination: Yes Received Pneumococcal Vaccination On or After 60th Birthday: No Implantable Devices None Family and Social History Cancer: Yes - Paternal Grandparents; Diabetes: Yes - Mother,Father; Heart Disease: Yes - Paternal Grandparents,Maternal Grandparents; Hereditary Spherocytosis: No; Hypertension: Yes - Maternal Grandparents,Paternal Grandparents; Kidney Disease: No; Lung Disease: No; Seizures: No; Stroke: Yes - Mother,Maternal Grandparents; Thyroid Problems: No; Tuberculosis: No; Never smoker; Marital Status - Married; Alcohol Use: Never; Drug Use: No History; Caffeine Use: Moderate; Financial Concerns: No; Food, Clothing or Shelter Needs: No; Support System Lacking: No; Transportation Concerns: No Electronic Signature(s) Signed: 02/02/2022 10:52:29 AM By: [name] Entered By: Geralyn Corwin on 02/02/2022 10:33:43 -------------------------------------------------------------------------------- SuperBill Details Patient Name: Date of Service: [name] Oxford D. 02/02/2022 Medical Record Number: [id number] Patient Account Number: [id number] Date of Birth/Sex: Treating RN: 1966/09/23 (56 y.o. 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

## 2022-02-07 NOTE — Progress Notes (Signed)
[name], [name] ([id number]) Visit Report for 02/02/2022 Arrival Information Details Patient Name: Date of Service: [place] [name], Alvarado 02/02/2022 10:00 A M Medical Record Number: [id number] Patient Account Number: [id number] Date of Birth/Sex: Treating RN: [birth year] (56 y.o. Female) Fonnie Mu Primary Care Kailena Lubas: PA TEL, RUTWIK Other Clinician: Referring [name]: Treating [name]/Extender: [name], THO [name] in Treatment: 1 Visit Information History Since Last Visit Added or deleted any medications: No Patient Arrived: Ambulatory Any new allergies or adverse reactions: No Arrival Time: 09:55 Had a fall or experienced change in No Accompanied By: self activities of daily living that may affect Transfer Assistance: None risk of falls: Patient Identification Verified: Yes Signs or symptoms of abuse/neglect since last visito No Secondary Verification Process Completed: Yes Hospitalized since last visit: No Patient Requires Transmission-Based Precautions: No Implantable device outside of the clinic excluding No Patient Has Alerts: No cellular tissue based products placed in the center since last visit: Has Dressing in Place as Prescribed: Yes Pain Present Now: No Electronic Signature(s) Signed: 02/07/2022 3:37:08 PM By: [name] Entered By: Fonnie Mu on 02/02/2022 09:55:43 -------------------------------------------------------------------------------- Encounter Discharge Information Details Patient Name: Date of Service: [name] Oxford D. 02/02/2022 10:00 A M Medical Record Number: [id number] Patient Account Number: [id number] Date of Birth/Sex: Treating RN: 24-May-1966 (56 y.o. Female) Fonnie Mu Primary Care Taviana Westergren: PA TEL, RUTWIK Other Clinician: Referring [name]: Treating [name]/Extender: [name], THO [name] in Treatment: 1 Encounter Discharge Information Items Post Procedure  Vitals Discharge Condition: Stable Temperature (F): 98.7 Ambulatory Status: Ambulatory Pulse (bpm): 74 Discharge Destination: Home Respiratory Rate (breaths/min): 17 Transportation: Private Auto Blood Pressure (mmHg): 134/74 Accompanied By: self Schedule Follow-up Appointment: Yes Clinical Summary of Care: Patient Declined Electronic Signature(s) Signed: 02/07/2022 3:37:08 PM By: [name] Entered By: Fonnie Mu on 02/02/2022 11:06:28 -------------------------------------------------------------------------------- Lower Extremity Assessment Details Patient Name: Date of Service: [name] Oxford D. 02/02/2022 10:00 A M Medical Record Number: [id number] Patient Account Number: [id number] Date of Birth/Sex: Treating RN: 1966-02-05 (56 y.o. Female) Fonnie Mu Primary Care Yutaka Holberg: PA TEL, RUTWIK Other Clinician: Referring Indi Willhite: Treating [name]/Extender: [name], THO [name] in Treatment: 1 Electronic Signature(s) Signed: 02/07/2022 3:37:08 PM By: [name] Entered By: Fonnie Mu on 02/02/2022 09:56:25 -------------------------------------------------------------------------------- Multi Wound Chart Details Patient Name: Date of Service: [name] Con Memos D. 02/02/2022 10:00 A M Medical Record Number: [id number] Patient Account Number: [id number] Date of Birth/Sex: Treating RN: 1966/06/24 (56 y.o. Female) Primary Care Pualani Borah: PA TEL, RUTWIK Other Clinician: Referring Alyene Predmore: Treating Sachin Ferencz/Extender: [name], THO [name] in Treatment: 1 Vital Signs Height(in): 58 Capillary Blood Glucose(mg/dl): 742 Weight(lbs): 595 Pulse(bpm): 74 Body Mass Index(BMI): 40.5 Blood Pressure(mmHg): 153/95 Temperature(F): 98.2 Respiratory Rate(breaths/min): 17 Photos: [N/A:N/A] Back Distal Back N/A Wound Location: Surgical Injury Surgical Injury N/A Wounding Event: Dehisced Wound Open Surgical  Wound N/A Primary Etiology: Hypertension, Type I Diabetes, Hypertension, Type I Diabetes, N/A Comorbid History: Neuropathy Neuropathy 12/14/2021 02/02/2022 N/A Date Acquired: 1 0 N/A Alvarado of Treatment: Open Open N/A Wound Status: No No N/A Wound Recurrence: 5.5x1.2x1 0.2x0.2x0.2 N/A Measurements L x W x D (cm) 5.184 0.031 N/A A (cm) : rea 5.184 0.006 N/A Volume (cm) : 31.20% 0.00% N/A % Reduction in Area: -129.20% 0.00% N/A % Reduction in Volume: Full Thickness Without Exposed Full Thickness Without Exposed N/A Classification: Support Structures Support Structures Medium Medium N/A Exudate Amount: Serosanguineous Serosanguineous N/A Exudate Type:  red, brown red, brown N/A Exudate Color: Well defined, not attached Distinct, outline attached N/A Wound Margin: Medium (34-66%) Large (67-100%) N/A Granulation Amount: Red, Pink Red, Pink N/A Granulation Quality: Medium (34-66%) Small (1-33%) N/A Necrotic Amount: Fat Layer (Subcutaneous Tissue): Yes Fat Layer (Subcutaneous Tissue): Yes N/A Exposed Structures: Fascia: No Fascia: No Tendon: No Tendon: No Muscle: No Muscle: No Joint: No Joint: No Bone: No Bone: No Small (1-33%) Small (1-33%) N/A Epithelialization: Treatment Notes Electronic Signature(s) Signed: 02/02/2022 10:52:29 AM By: [name] Entered By: Geralyn Corwin on 02/02/2022 10:31:17 -------------------------------------------------------------------------------- Multi-Disciplinary Care Plan Details Patient Name: Date of Service: [name] Oxford D. 02/02/2022 10:00 A M Medical Record Number: [id number] Patient Account Number: [id number] Date of Birth/Sex: Treating RN: 1966/09/06 (56 y.o. Female) Fonnie Mu Primary Care Dreshon Proffit: PA TEL, RUTWIK Other Clinician: Referring Sharea Guinther: Treating Adan Baehr/Extender: [name], THO [name] in Treatment: 1 Active Inactive Wound/Skin Impairment Nursing  Diagnoses: Impaired tissue integrity Goals: Patient/caregiver will verbalize understanding of skin care regimen Date Initiated: 01/26/2022 Target Resolution Date: 02/22/2022 Goal Status: Active Ulcer/skin breakdown will have a volume reduction of 30% by week 4 Date Initiated: 01/26/2022 Target Resolution Date: 02/22/2022 Goal Status: Active Interventions: Assess patient/caregiver ability to obtain necessary supplies Assess patient/caregiver ability to perform ulcer/skin care regimen upon admission and as needed Assess ulceration(s) every visit Provide education on ulcer and skin care Treatment Activities: Topical wound management initiated : 01/26/2022 Notes: Electronic Signature(s) Signed: 02/07/2022 3:37:08 PM By: [name] Entered By: Fonnie Mu on 02/02/2022 11:04:26 -------------------------------------------------------------------------------- Pain Assessment Details Patient Name: Date of Service: [name] Oxford D. 02/02/2022 10:00 A M Medical Record Number: [id number] Patient Account Number: [id number] Date of Birth/Sex: Treating RN: 11-Jun-1966 (56 y.o. Female) Fonnie Mu Primary Care Xzavian Semmel: PA TEL, RUTWIK Other Clinician: Referring Bardia Wangerin: Treating [name]/Extender: [name], THO [name] in Treatment: 1 Active Problems Location of Pain Severity and Description of Pain Patient Has Paino No Site Locations Pain Management and Medication Current Pain Management: Electronic Signature(s) Signed: 02/07/2022 3:37:08 PM By: [name] Entered By: Fonnie Mu on 02/02/2022 09:56:17 -------------------------------------------------------------------------------- Patient/Caregiver Education Details Patient Name: Date of Service: [name], [name] 2/24/2023andnbsp10:00 A M Medical Record Number: [id number] Patient Account Number: [id number] Date of Birth/Gender: Treating RN: [birth year] (56 y.o. Female) Fonnie Mu Primary Care Physician: PA TEL, RUTWIK Other Clinician: Referring Physician: Treating Physician/Extender: [name], THO [name] in Treatment: 1 Education Assessment Education Provided To: Patient Education Topics Provided Wound/Skin Impairment: Methods: Explain/Verbal Responses: State content correctly Electronic Signature(s) Signed: 02/07/2022 3:37:08 PM By: [name] Entered By: Fonnie Mu on 02/02/2022 11:05:05 -------------------------------------------------------------------------------- Wound Assessment Details Patient Name: Date of Service: [name] Oxford D. 02/02/2022 10:00 A M Medical Record Number: [id number] Patient Account Number: [id number] Date of Birth/Sex: Treating RN: 1966-08-22 (56 y.o. Female) Fonnie Mu Primary Care Kelleen Stolze: PA TEL, RUTWIK Other Clinician: Referring Trentin Knappenberger: Treating [name]/Extender: [name], THO [name] in Treatment: 1 Wound Status Wound Number: 1 Primary Etiology: Dehisced Wound Wound Location: Back Wound Status: Open Wounding Event: Surgical Injury Comorbid History: Hypertension, Type I Diabetes, Neuropathy Date Acquired: 12/14/2021 Alvarado Of Treatment: 1 Clustered Wound: No Photos Wound Measurements Length: (cm) 5.5 Width: (cm) 1.2 Depth: (cm) 1 Area: (cm) 5.184 Volume: (cm) 5.184 % Reduction in Area: 31.2% % Reduction in Volume: -129.2% Epithelialization: Small (1-33%) Tunneling: No Undermining: No Wound Description Classification: Full Thickness Without Exposed Support Structures  Wound Margin: Well defined, not attached Exudate Amount: Medium Exudate Type: Serosanguineous Exudate Color: red, brown Foul Odor After Cleansing: No Slough/Fibrino Yes Wound Bed Granulation Amount: Medium (34-66%) Exposed Structure Granulation Quality: Red, Pink Fascia Exposed: No Necrotic Amount: Medium (34-66%) Fat Layer (Subcutaneous Tissue) Exposed:  Yes Necrotic Quality: Adherent Slough Tendon Exposed: No Muscle Exposed: No Joint Exposed: No Bone Exposed: No Treatment Notes Wound #1 (Back) Cleanser Soap and Water Discharge Instruction: May shower and wash wound with dial antibacterial soap and water prior to dressing change. Wound Cleanser Discharge Instruction: Cleanse the wound with wound cleanser prior to applying a clean dressing using gauze sponges, not tissue or cotton balls. Peri-Wound Care Skin Prep Discharge Instruction: Use skin prep as directed Topical Primary Dressing MediHoney Gel, tube 1.5 (oz) Discharge Instruction: Apply to wound bed as instructed Secondary Dressing Woven Gauze Sponges 2x2 in Discharge Instruction: Apply over primary dressing as directed. Zetuvit Plus Silicone Border Dressing 7x7(in/in) Discharge Instruction: Apply silicone border over primary dressing as directed. Secured With Compression Wrap Compression Stockings Facilities manager) Signed: 02/07/2022 3:37:08 PM By: [name] Entered By: Fonnie Mu on 02/02/2022 10:07:16 -------------------------------------------------------------------------------- Wound Assessment Details Patient Name: Date of Service: [name] Oxford D. 02/02/2022 10:00 A M Medical Record Number: [id number] Patient Account Number: [id number] Date of Birth/Sex: Treating RN: Apr 01, 1966 (56 y.o. Female) Fonnie Mu Primary Care Imara Standiford: PA TEL, RUTWIK Other Clinician: Referring Annisa Mazzarella: Treating Zlatan Hornback/Extender: [name], THO [name] in Treatment: 1 Wound Status Wound Number: 2 Primary Etiology: Open Surgical Wound Wound Location: Distal Back Wound Status: Open Wounding Event: Surgical Injury Comorbid History: Hypertension, Type I Diabetes, Neuropathy Date Acquired: 02/02/2022 Alvarado Of Treatment: 0 Clustered Wound: No Photos Wound Measurements Length: (cm) 0.2 Width: (cm) 0.2 Depth: (cm)  0.2 Area: (cm) 0.031 Volume: (cm) 0.006 % Reduction in Area: 0% % Reduction in Volume: 0% Epithelialization: Small (1-33%) Tunneling: No Undermining: No Wound Description Classification: Full Thickness Without Exposed Support Structures Wound Margin: Distinct, outline attached Exudate Amount: Medium Exudate Type: Serosanguineous Exudate Color: red, brown Foul Odor After Cleansing: No Slough/Fibrino Yes Wound Bed Granulation Amount: Large (67-100%) Exposed Structure Granulation Quality: Red, Pink Fascia Exposed: No Necrotic Amount: Small (1-33%) Fat Layer (Subcutaneous Tissue) Exposed: Yes Necrotic Quality: Adherent Slough Tendon Exposed: No Muscle Exposed: No Joint Exposed: No Bone Exposed: No Treatment Notes Wound #2 (Back) Wound Laterality: Distal Cleanser Soap and Water Discharge Instruction: May shower and wash wound with dial antibacterial soap and water prior to dressing change. Wound Cleanser Discharge Instruction: Cleanse the wound with wound cleanser prior to applying a clean dressing using gauze sponges, not tissue or cotton balls. Peri-Wound Care Skin Prep Discharge Instruction: Use skin prep as directed Topical Primary Dressing MediHoney Gel, tube 1.5 (oz) Discharge Instruction: Apply to wound bed as instructed Secondary Dressing Woven Gauze Sponges 2x2 in Discharge Instruction: Apply over primary dressing as directed. Zetuvit Plus Silicone Border Dressing 7x7(in/in) Discharge Instruction: Apply silicone border over primary dressing as directed. Secured With Compression Wrap Compression Stockings Facilities manager) Signed: 02/07/2022 3:37:08 PM By: [name] Entered By: Fonnie Mu on 02/02/2022 10:07:47 -------------------------------------------------------------------------------- Vitals Details Patient Name: Date of Service: [name] Con Memos D. 02/02/2022 10:00 A M Medical Record Number: [id number] Patient Account  Number: [id number] Date of Birth/Sex: Treating RN: 1966-02-25 (56 y.o. Female) Fonnie Mu Primary Care Boyd Buffalo: PA TEL, RUTWIK Other Clinician: Referring Lakiyah Arntson: Treating Aalyssa Elderkin/Extender: [name], THO [name] in Treatment: 1 Vital Signs  Time Taken: 09:55 Temperature (F): 98.2 Height (in): 58 Pulse (bpm): 74 Weight (lbs): 194 Respiratory Rate (breaths/min): 17 Body Mass Index (BMI): 40.5 Blood Pressure (mmHg): 153/95 Capillary Blood Glucose (mg/dl): 409201 Reference Range: 80 - 120 mg / dl Electronic Signature(s) Signed: 02/07/2022 3:37:08 PM By: Fonnie MuBreedlove, Lauren RN Entered By: Fonnie MuBreedlove, Lauren on 02/02/2022 09:56:11

## 2022-02-08 ENCOUNTER — Other Ambulatory Visit: Payer: Self-pay

## 2022-02-08 ENCOUNTER — Other Ambulatory Visit: Payer: Self-pay | Admitting: Internal Medicine

## 2022-02-08 ENCOUNTER — Other Ambulatory Visit (HOSPITAL_COMMUNITY): Payer: Self-pay

## 2022-02-08 ENCOUNTER — Encounter (HOSPITAL_BASED_OUTPATIENT_CLINIC_OR_DEPARTMENT_OTHER): Payer: No Typology Code available for payment source | Attending: Internal Medicine | Admitting: Internal Medicine

## 2022-02-08 DIAGNOSIS — X58XXXA Exposure to other specified factors, initial encounter: Secondary | ICD-10-CM | POA: Diagnosis not present

## 2022-02-08 DIAGNOSIS — G4733 Obstructive sleep apnea (adult) (pediatric): Secondary | ICD-10-CM | POA: Insufficient documentation

## 2022-02-08 DIAGNOSIS — Z9641 Presence of insulin pump (external) (internal): Secondary | ICD-10-CM | POA: Diagnosis not present

## 2022-02-08 DIAGNOSIS — E1040 Type 1 diabetes mellitus with diabetic neuropathy, unspecified: Secondary | ICD-10-CM | POA: Diagnosis not present

## 2022-02-08 DIAGNOSIS — Z794 Long term (current) use of insulin: Secondary | ICD-10-CM | POA: Insufficient documentation

## 2022-02-08 DIAGNOSIS — M48062 Spinal stenosis, lumbar region with neurogenic claudication: Secondary | ICD-10-CM | POA: Insufficient documentation

## 2022-02-08 DIAGNOSIS — E10622 Type 1 diabetes mellitus with other skin ulcer: Secondary | ICD-10-CM | POA: Diagnosis not present

## 2022-02-08 DIAGNOSIS — L98428 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: Secondary | ICD-10-CM

## 2022-02-08 DIAGNOSIS — T8131XA Disruption of external operation (surgical) wound, not elsewhere classified, initial encounter: Secondary | ICD-10-CM | POA: Insufficient documentation

## 2022-02-08 DIAGNOSIS — G8929 Other chronic pain: Secondary | ICD-10-CM | POA: Insufficient documentation

## 2022-02-08 DIAGNOSIS — I10 Essential (primary) hypertension: Secondary | ICD-10-CM | POA: Insufficient documentation

## 2022-02-08 MED ORDER — LISINOPRIL-HYDROCHLOROTHIAZIDE 20-12.5 MG PO TABS
1.0000 | ORAL_TABLET | Freq: Every day | ORAL | 3 refills | Status: DC
  Filled 2022-02-08: qty 90, 90d supply, fill #0
  Filled 2022-05-18: qty 90, 90d supply, fill #1
  Filled 2022-08-11: qty 90, 90d supply, fill #2
  Filled 2022-11-09: qty 90, 90d supply, fill #3

## 2022-02-08 MED ORDER — SIMVASTATIN 40 MG PO TABS
40.0000 mg | ORAL_TABLET | Freq: Every evening | ORAL | 3 refills | Status: DC
Start: 2022-02-08 — End: 2023-01-29
  Filled 2022-02-08: qty 90, 90d supply, fill #0
  Filled 2022-05-18: qty 90, 90d supply, fill #1
  Filled 2022-08-11: qty 90, 90d supply, fill #2
  Filled 2022-11-09: qty 90, 90d supply, fill #3

## 2022-02-08 NOTE — Progress Notes (Signed)
[name], [name] ([id number]) Visit Report for 02/08/2022 Chief Complaint Document Details Patient Name: Date of Service: [name], [name] 02/08/2022 8:30 A M Medical Record Number: [id number] Patient Account Number: [id number] Date of Birth/Sex: Treating RN: June 28, 1966 (56 y.o. F) Primary Care Provider: PA TEL, RUTWIK Other Clinician: Referring Provider: Treating Provider/Extender: Kalman Shan PA TEL, RUTWIK Weeks in Treatment: 1 Information Obtained from: Patient Chief Complaint Open wound to the back secondary to surgical wound dehiscence Electronic Signature(s) Signed: 02/08/2022 10:34:33 AM By: Kalman Shan DO Entered By: Kalman Shan on 02/08/2022 10:31:12 -------------------------------------------------------------------------------- Debridement Details Patient Name: Date of Service: [name] Royalty D. 02/08/2022 8:30 A M Medical Record Number: [id number] Patient Account Number: [id number] Date of Birth/Sex: Treating RN: 1966/06/28 (56 y.o. [name], [name] Primary Care Provider: PA TEL, RUTWIK Other Clinician: Referring Provider: Treating Provider/Extender: Kalman Shan PA TEL, RUTWIK Weeks in Treatment: 1 Debridement Performed for Assessment: Wound #1 Back Performed By: Physician Kalman Shan, DO Debridement Type: Debridement Level of Consciousness (Pre-procedure): Awake and Alert Pre-procedure Verification/Time Out Yes - 08:58 Taken: Start Time: 08:58 Pain Control: Lidocaine T Area Debrided (L x W): otal 4.2 (cm) x 1 (cm) = 4.2 (cm) Tissue and other material debrided: Viable, Non-Viable, Slough, Subcutaneous, Skin: Dermis , Skin: Epidermis, Slough Level: Skin/Subcutaneous Tissue Debridement Description: Excisional Instrument: Curette Bleeding: Minimum Hemostasis Achieved: Pressure End Time: 08:58 Procedural Pain: 0 Post Procedural Pain: 0 Response to Treatment: Procedure was tolerated well Level of Consciousness (Post- Awake and  Alert procedure): Post Debridement Measurements of Total Wound Length: (cm) 4.2 Width: (cm) 1 Depth: (cm) 0.7 Volume: (cm) 2.309 Character of Wound/Ulcer Post Debridement: Improved Post Procedure Diagnosis Same as Pre-procedure Electronic Signature(s) Signed: 02/08/2022 10:34:33 AM By: Kalman Shan DO Signed: 02/08/2022 4:17:36 PM By: Rhae Hammock RN Entered By: Rhae Hammock on 02/08/2022 08:58:57 -------------------------------------------------------------------------------- HPI Details Patient Name: Date of Service: [name] Royalty D. 02/08/2022 8:30 A M Medical Record Number: [id number] Patient Account Number: [id number] Date of Birth/Sex: Treating RN: 02-09-1966 (56 y.o. F) Primary Care Provider: PA TEL, RUTWIK Other Clinician: Referring Provider: Treating Provider/Extender: Kalman Shan PA TEL, RUTWIK Weeks in Treatment: 1 History of Present Illness HPI Description: 01/26/2022 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 She has a past medical history of type 2 diabetes currently controlled on an insulin pump, morbid obesity, spinal stenosis and OSA that presents to the clinic for a 1-52-month history of nonhealing wound to her back. She had surgery on 12/05/2021 with Dr. Zada Finders for spondylolisthesis with lumbar radiculopathy and had an L5-S1 open TLIF and posterior lateral instrumented fusion. She subsequently developed a hematoma and had to have this evacuated on 12/14/2021. She developed wound dehiscence after the surgery and has an open wound to her back. She has not been doing any wound care to the area. She does not even keep this covered. She currently denies signs of infection. She has some chronic pain to the area. 2/24; patient presents for follow-up. She could not obtain Santyl due to cost. She started Medihoney. She has no issues or complaints today. She denies signs of infection. 3/2; patient presents for follow-up. She  continues to use Medihoney without issues. She denies signs of infection. Electronic Signature(s) Signed: 02/08/2022 10:34:33 AM By: Kalman Shan DO Entered By: Kalman Shan on 02/08/2022 10:31:41 -------------------------------------------------------------------------------- Physical Exam Details Patient Name: Date of Service: [name] Royalty D. 02/08/2022 8:30 A M Medical Record Number: [id number] Patient Account Number: [id number] Date of Birth/Sex:  Treating RN: 02/06/1966 (56 y.o. F) Primary Care Provider: PA TEL, RUTWIK Other Clinician: Referring Provider: Treating Provider/Extender: Kalman Shan PA TEL, RUTWIK Weeks in Treatment: 1 Constitutional respirations regular, non-labored and within target range for patient.Marland Kitchen Psychiatric pleasant and cooperative. Notes : Back: T the lumbar region she has an elongated wound along a previous surgical incision site. There is granulation tissue present along with nonviable tissue. o Most of the nonviable tissue was tightly adhered. Mild tenderness on palpation. No drainage noted. No surrounding signs of infection. Electronic Signature(s) Signed: 02/08/2022 10:34:33 AM By: Kalman Shan DO Signed: 02/08/2022 10:34:33 AM By: Kalman Shan DO Entered By: Kalman Shan on 02/08/2022 10:32:46 -------------------------------------------------------------------------------- Physician Orders Details Patient Name: Date of Service: [name] Royalty D. 02/08/2022 8:30 A M Medical Record Number: [id number] Patient Account Number: [id number] Date of Birth/Sex: Treating RN: [birth year] (56 y.o. [name], [name] Primary Care Provider: PA TEL, RUTWIK Other Clinician: Referring Provider: Treating Provider/Extender: Kalman Shan PA TEL, RUTWIK Weeks in Treatment: 1 Verbal / Phone Orders: No Diagnosis Coding Follow-up Appointments ppointment in 2 weeks. - with Dr. Heber Black[place] and Allayne Butcher in Room # 9 Return A Bathing/ Shower/ Hygiene May  shower and wash wound with soap and water. - when changing dressing Additional Orders / Instructions Follow Nutritious Diet - Continue to monitor/control Blood Sugar Other: - Byram=Supplies Wound Treatment Wound #1 - Back Cleanser: Soap and Water 1 x Per Day/30 Days Discharge Instructions: May shower and wash wound with dial antibacterial soap and water prior to dressing change. Cleanser: Wound Cleanser (Generic) 1 x Per Day/30 Days Discharge Instructions: Anasept.Marland KitchenMarland KitchenCleanse the wound with wound cleanser prior to applying a clean dressing using gauze sponges, not tissue or cotton balls. Peri-Wound Care: Skin Prep (Generic) 1 x Per Day/30 Days Discharge Instructions: Use skin prep as directed Prim Dressing: MediHoney Gel, tube 1.5 (oz) 1 x Per Day/30 Days ary Discharge Instructions: Apply to wound bed as instructed Secondary Dressing: Woven Gauze Sponges 2x2 in (Generic) 1 x Per Day/30 Days Discharge Instructions: Apply over primary dressing as directed. Secondary Dressing: Zetuvit Plus Silicone Border Dressing 7x7(in/in) (Generic) 1 x Per Day/30 Days Discharge Instructions: Apply silicone border over primary dressing as directed. Electronic Signature(s) Signed: 02/08/2022 10:34:33 AM By: Kalman Shan DO Entered By: Kalman Shan on 02/08/2022 10:33:00 -------------------------------------------------------------------------------- Problem List Details Patient Name: Date of Service: [name] Royalty D. 02/08/2022 8:30 A M Medical Record Number: [id number] Patient Account Number: [id number] Date of Birth/Sex: Treating RN: Jul 15, 1966 (56 y.o. 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 01/26/2022 No Yes T81.31XA  Disruption of external operation (surgical) wound, not elsewhere classified, 01/26/2022 No Yes initial encounter E11.622 Type 2 diabetes mellitus with other skin ulcer 01/26/2022 No Yes M48.062 Spinal stenosis, lumbar region with neurogenic claudication 01/26/2022 No Yes Inactive Problems Resolved Problems Electronic Signature(s) Signed: 02/08/2022 10:34:33 AM By: Kalman Shan DO Entered By: Kalman Shan on 02/08/2022 10:26:36 -------------------------------------------------------------------------------- Progress Note Details Patient Name: Date of Service: [name] Desiree Lucy D. 02/08/2022 8:30 A M Medical Record Number: [id number] Patient Account Number: [id number] Date of Birth/Sex: Treating RN: 04/08/1966 (56 y.o. F) Primary Care Provider: PA TEL, RUTWIK Other Clinician: Referring Provider: Treating Provider/Extender: Kalman Shan PA TEL, RUTWIK Weeks in Treatment: 1 Subjective Chief Complaint Information obtained from Patient Open wound to the back secondary to surgical  wound dehiscence History of Present Illness (HPI) 01/26/2022 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 She has a past medical history of type 2 diabetes currently controlled on an insulin pump, morbid obesity, spinal stenosis and OSA that presents to the clinic for a 1-7-month history of nonhealing wound to her back. She had surgery on 12/05/2021 with Dr. Zada Finders for spondylolisthesis with lumbar radiculopathy and had an L5-S1 open TLIF and posterior lateral instrumented fusion. She subsequently developed a hematoma and had to have this evacuated on 12/14/2021. She developed wound dehiscence after the surgery and has an open wound to her back. She has not been doing any wound care to the area. She does not even keep this covered. She currently denies signs of infection. She has some chronic pain to the area. 2/24; patient presents for follow-up. She could not obtain Santyl  due to cost. She started Medihoney. She has no issues or complaints today. She denies signs of infection. 3/2; patient presents for follow-up. She continues to use Medihoney without issues. She denies signs of infection. Patient History Information obtained from Patient. Family History Cancer - Paternal Grandparents, Diabetes - Mother,Father, Heart Disease - Paternal Grandparents,Maternal Grandparents, Hypertension - Maternal Grandparents,Paternal Grandparents, Stroke - Mother,Maternal Grandparents, No family history of Hereditary Spherocytosis, Kidney Disease, Lung Disease, Seizures, Thyroid Problems, Tuberculosis. Social History Never smoker, Marital Status - Married, Alcohol Use - Never, Drug Use - No History, Caffeine Use - Moderate. Medical History Cardiovascular Patient has history of Hypertension Endocrine Patient has history of Type I Diabetes Neurologic Patient has history of Neuropathy Medical A Surgical History Notes nd Musculoskeletal Spinal Stenosis-Surgery 12/05/21 Objective Constitutional respirations regular, non-labored and within target range for patient.. Vitals Time Taken: 8:21 AM, Height: 58 in, Weight: 194 lbs, BMI: 40.5, Temperature: 98.2 F, Pulse: 82 bpm, Respiratory Rate: 17 breaths/min, Blood Pressure: 145/85 mmHg, Capillary Blood Glucose: 164 mg/dl. Psychiatric pleasant and cooperative. General Notes: : Back: T the lumbar region she has an elongated wound along a previous surgical incision site. There is granulation tissue present along with o nonviable tissue. Most of the nonviable tissue was tightly adhered. Mild tenderness on palpation. No drainage noted. No surrounding signs of infection. Integumentary (Hair, Skin) Wound #1 status is Open. Original cause of wound was Surgical Injury. The date acquired was: 12/14/2021. The wound has been in treatment 1 weeks. The wound is located on the Back. The wound measures 4.2cm length x 1cm width x 0.7cm depth;  3.299cm^2 area and 2.309cm^3 volume. There is Fat Layer (Subcutaneous Tissue) exposed. There is no tunneling or undermining noted. There is a medium amount of serosanguineous drainage noted. The wound margin is well defined and not attached to the wound base. There is medium (34-66%) red, pink granulation within the wound bed. There is a medium (34-66%) amount of necrotic tissue within the wound bed including Adherent Slough. Wound #2 status is Healed - Epithelialized. Original cause of wound was Surgical Injury. The date acquired was: 02/02/2022. The wound is located on the Distal Back. The wound measures 0cm length x 0cm width x 0cm depth; 0cm^2 area and 0cm^3 volume. There is no tunneling or undermining noted. There is a none present amount of drainage noted. The wound margin is distinct with the outline attached to the wound base. There is no granulation within the wound bed. There is no necrotic tissue within the wound bed. 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 No signs of surrounding infection. I debrided nonviable tissue. I recommended continue with current therapy of Medihoney. Procedures Wound #1 Pre-procedure diagnosis of Wound #1 is a Dehisced Wound located on the Back . There was a Excisional Skin/Subcutaneous Tissue Debridement with a total area of 4.2 sq cm performed by Kalman Shan, DO. With the following instrument(s): Curette to remove Viable and Non-Viable tissue/material. Material removed includes Subcutaneous Tissue, Slough, Skin: Dermis, and Skin: Epidermis after achieving pain control using Lidocaine. No specimens were taken.  A time out was conducted at 08:58, prior to the start of the procedure. A Minimum amount of bleeding was controlled with Pressure. The procedure was tolerated well with a pain level of 0 throughout and a pain level of 0 following the procedure. Post Debridement Measurements: 4.2cm length x 1cm width x 0.7cm depth; 2.309cm^3 volume. Character of Wound/Ulcer Post Debridement is improved. Post procedure Diagnosis Wound #1: Same as Pre-Procedure Plan Follow-up Appointments: Return Appointment in 2 weeks. - with Dr. Heber [place] and Allayne Butcher in Room # 9 Bathing/ Shower/ Hygiene: May shower and wash wound with soap and water. - when changing dressing Additional Orders / Instructions: Follow Nutritious Diet - Continue to monitor/control Blood Sugar Other: - Byram=Supplies WOUND #1: - Back Wound Laterality: Cleanser: Soap and Water 1 x Per Day/30 Days Discharge Instructions: May shower and wash wound with dial antibacterial soap and water prior to dressing change. Cleanser: Wound Cleanser (Generic) 1 x Per Day/30 Days Discharge Instructions: Anasept.Marland KitchenMarland KitchenCleanse the wound with wound cleanser prior to applying a clean dressing using gauze sponges, not tissue or cotton balls. Peri-Wound Care: Skin Prep (Generic) 1 x Per Day/30 Days Discharge Instructions: Use skin prep as directed Prim Dressing: MediHoney Gel, tube 1.5 (oz) 1 x Per Day/30 Days ary Discharge Instructions: Apply to wound bed as instructed Secondary Dressing: Woven Gauze Sponges 2x2 in (Generic) 1 x Per Day/30 Days Discharge Instructions: Apply over primary dressing as directed. Secondary Dressing: Zetuvit Plus Silicone Border Dressing 7x7(in/in) (Generic) 1 x Per Day/30 Days Discharge Instructions: Apply silicone border over primary dressing as directed. 1. In office sharp debridement 2. Medihoney 3. Follow-up in 2 weeks Electronic Signature(s) Signed: 02/08/2022 10:34:33 AM By: Kalman Shan DO Entered By: Kalman Shan on  02/08/2022 10:34:02 -------------------------------------------------------------------------------- HxROS Details Patient Name: Date of Service: [name] Desiree Lucy D. 02/08/2022 8:30 A M Medical Record Number: [id number] Patient Account Number: [id number] Date of Birth/Sex: Treating RN: 1966-05-03 (56 y.o. F) Primary Care Provider: PA TEL, RUTWIK Other Clinician: Referring Provider: Treating Provider/Extender: Kalman Shan PA TEL, RUTWIK Weeks in Treatment: 1 Information Obtained From Patient Cardiovascular Medical History: Positive for: Hypertension Endocrine Medical History: Positive for: Type I Diabetes Time with diabetes: 48 years Treated with: Insulin Blood sugar tested every day: Yes Tested : 3x day Musculoskeletal Medical History: Past Medical History Notes: Spinal Stenosis-Surgery 12/05/21 Neurologic Medical History: Positive for: Neuropathy Immunizations Pneumococcal Vaccine: Received Pneumococcal Vaccination: Yes Received Pneumococcal Vaccination On or After 60th Birthday: No Implantable Devices None Family and Social History Cancer: Yes - Paternal Grandparents; Diabetes: Yes - Mother,Father; Heart Disease: Yes - Paternal Grandparents,Maternal Grandparents; Hereditary Spherocytosis: No; Hypertension: Yes - Maternal Grandparents,Paternal Grandparents; Kidney Disease: No; Lung Disease: No; Seizures: No; Stroke: Yes - Mother,Maternal Grandparents; Thyroid Problems: No; Tuberculosis: No; Never smoker; Marital Status -  Married; Alcohol Use: Never; Drug Use: No History; Caffeine Use: Moderate; Financial Concerns: No; Food, Clothing or Shelter Needs: No; Support System Lacking: No; Transportation Concerns: No Electronic Signature(s) Signed: 02/08/2022 10:34:33 AM By: Kalman Shan DO Entered By: Kalman Shan on 02/08/2022 10:31:51 -------------------------------------------------------------------------------- SuperBill Details Patient Name: Date of Service: [name] Royalty D. 02/08/2022 Medical Record Number: [id number] Patient Account Number: [id number] Date of Birth/Sex: Treating RN: 1966/08/05 (56 y.o. 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 T81.31XA Disruption of external operation (surgical) wound, not elsewhere classified, initial encounter E11.622 Type 2 diabetes mellitus with other skin ulcer M48.062 Spinal stenosis, lumbar region with neurogenic claudication Facility Procedures CPT4 Code: [id number] Description: [place] - DEB SUBQ TISSUE 20 SQ CM/< ICD-10 Diagnosis Description L98.428 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

## 2022-02-08 NOTE — Progress Notes (Signed)
[name], [name] ([id number]) Visit Report for 02/08/2022 Arrival Information Details Patient Name: Date of Service: [place] [name], [name] 02/08/2022 8:30 A M Medical Record Number: [id number] Patient Account Number: [id number] Date of Birth/Sex: Treating RN: 06/27/1966 (56 y.o. [name]) Breedlove, Lauren Primary Care [name]: PA TEL, RUTWIK Other Clinician: Referring Alesana Magistro: Treating [name]/Extender: Geralyn CorwinHoffman, Jessica PA TEL, RUTWIK Weeks in Treatment: 1 Visit Information History Since Last Visit Added or deleted any medications: No Patient Arrived: Ambulatory Any new allergies or adverse reactions: No Arrival Time: 08:21 Had a fall or experienced change in No Accompanied By: self activities of daily living that may affect Transfer Assistance: None risk of falls: Patient Identification Verified: Yes Signs or symptoms of abuse/neglect since last visito No Secondary Verification Process Completed: Yes Hospitalized since last visit: No Patient Requires Transmission-Based Precautions: No Implantable device outside of the clinic excluding No Patient Has Alerts: No cellular tissue based products placed in the center since last visit: Has Dressing in Place as Prescribed: Yes Pain Present Now: No Electronic Signature(s) Signed: 02/08/2022 4:17:36 PM By: Fonnie MuBreedlove, Lauren RN Entered By: Fonnie MuBreedlove, Lauren on 02/08/2022 08:21:33 -------------------------------------------------------------------------------- Encounter Discharge Information Details Patient Name: Date of Service: [name] [name], [name] 02/08/2022 8:30 A M Medical Record Number: [id number] Patient Account Number: [id number] Date of Birth/Sex: Treating RN: 07/10/1966 (56 y.o. [name]) Breedlove, Lauren Primary Care Joyce Heitman: PA TEL, RUTWIK Other Clinician: Referring Ludie Hudon: Treating [name]/Extender: Geralyn CorwinHoffman, Jessica PA TEL, RUTWIK Weeks in Treatment: 1 Encounter Discharge Information Items Post Procedure Vitals Discharge Condition:  Stable Temperature (F): 98.7 Ambulatory Status: Ambulatory Pulse (bpm): 74 Discharge Destination: Home Respiratory Rate (breaths/min): 17 Transportation: Private Auto Blood Pressure (mmHg): 134/74 Accompanied By: self Schedule Follow-up Appointment: Yes Clinical Summary of Care: Patient Declined Electronic Signature(s) Signed: 02/08/2022 4:17:36 PM By: Fonnie MuBreedlove, Lauren RN Entered By: Fonnie MuBreedlove, Lauren on 02/08/2022 09:47:13 -------------------------------------------------------------------------------- Lower Extremity Assessment Details Patient Name: Date of Service: [name] [name], [name] 02/08/2022 8:30 A M Medical Record Number: [id number] Patient Account Number: [id number] Date of Birth/Sex: Treating RN: 07/01/1966 (56 y.o. [name]) Breedlove, Lauren Primary Care [name]: PA TEL, RUTWIK Other Clinician: Referring Isael Stille: Treating Sharece Fleischhacker/Extender: Geralyn CorwinHoffman, Jessica PA TEL, RUTWIK Weeks in Treatment: 1 Electronic Signature(s) Signed: 02/08/2022 4:17:36 PM By: Fonnie MuBreedlove, Lauren RN Entered By: Fonnie MuBreedlove, Lauren on 02/08/2022 08:21:46 -------------------------------------------------------------------------------- Multi Wound Chart Details Patient Name: Date of Service: [name], [name] 02/08/2022 8:30 A M Medical Record Number: [id number] Patient Account Number: [id number] Date of Birth/Sex: Treating RN: 12/06/1966 (56 y.o. F) Primary Care [name]: PA TEL, RUTWIK Other Clinician: Referring Savhanna Sliva: Treating Saniya Tranchina/Extender: Geralyn CorwinHoffman, Jessica PA TEL, RUTWIK Weeks in Treatment: 1 Vital Signs Height(in): 58 Capillary Blood Glucose(mg/dl): 536164 Weight(lbs): 644194 Pulse(bpm): 82 Body Mass Index(BMI): 40.5 Blood Pressure(mmHg): 145/85 Temperature(F): 98.2 Respiratory Rate(breaths/min): 17 Photos: [N/A:N/A] Back Distal Back N/A Wound Location: Surgical Injury Surgical Injury N/A Wounding Event: Dehisced Wound Open Surgical Wound N/A Primary Etiology: Hypertension, Type I  Diabetes, Hypertension, Type I Diabetes, N/A Comorbid History: Neuropathy Neuropathy 12/14/2021 02/02/2022 N/A Date Acquired: 1 0 N/A Weeks of Treatment: Open Healed - Epithelialized N/A Wound Status: No No N/A Wound Recurrence: 4.2x1x0.7 0x0x0 N/A Measurements L x W x D (cm) 3.299 0 N/A A (cm) : rea 2.309 0 N/A Volume (cm) : 56.20% 100.00% N/A % Reduction in Area: -2.10% 100.00% N/A % Reduction in Volume: Full Thickness Without Exposed Full Thickness Without Exposed N/A Classification: Support Structures Support Structures Medium None Present N/A Exudate Amount: Serosanguineous N/A N/A Exudate Type: red, brown N/A N/A Exudate Color: Well defined, not  attached Distinct, outline attached N/A Wound Margin: Medium (34-66%) None Present (0%) N/A Granulation Amount: Red, Pink N/A N/A Granulation Quality: Medium (34-66%) None Present (0%) N/A Necrotic Amount: Fat Layer (Subcutaneous Tissue): Yes Fascia: No N/A Exposed Structures: Fascia: No Fat Layer (Subcutaneous Tissue): No Tendon: No Tendon: No Muscle: No Muscle: No Joint: No Joint: No Bone: No Bone: No Small (1-33%) Large (67-100%) N/A Epithelialization: Debridement - Excisional N/A N/A Debridement: Pre-procedure Verification/Time Out 08:58 N/A N/A Taken: Lidocaine N/A N/A Pain Control: Subcutaneous, Slough N/A N/A Tissue Debrided: Skin/Subcutaneous Tissue N/A N/A Level: 4.2 N/A N/A Debridement A (sq cm): rea Curette N/A N/A Instrument: Minimum N/A N/A Bleeding: Pressure N/A N/A Hemostasis A chieved: 0 N/A N/A Procedural Pain: 0 N/A N/A Post Procedural Pain: Procedure was tolerated well N/A N/A Debridement Treatment Response: 4.2x1x0.7 N/A N/A Post Debridement Measurements L x W x D (cm) 2.309 N/A N/A Post Debridement Volume: (cm) Debridement N/A N/A Procedures Performed: Treatment Notes Wound #1 (Back) Cleanser Soap and Water Discharge Instruction: May shower and wash wound with  dial antibacterial soap and water prior to dressing change. Wound Cleanser Discharge Instruction: Anasept.Marland KitchenMarland KitchenCleanse the wound with wound cleanser prior to applying a clean dressing using gauze sponges, not tissue or cotton balls. Peri-Wound Care Skin Prep Discharge Instruction: Use skin prep as directed Topical Primary Dressing MediHoney Gel, tube 1.5 (oz) Discharge Instruction: Apply to wound bed as instructed Secondary Dressing Woven Gauze Sponges 2x2 in Discharge Instruction: Apply over primary dressing as directed. Zetuvit Plus Silicone Border Dressing 7x7(in/in) Discharge Instruction: Apply silicone border over primary dressing as directed. Secured With Compression Wrap Compression Stockings Facilities manager) Signed: 02/08/2022 10:34:33 AM By: [name] Entered By: Geralyn Corwin on 02/08/2022 10:27:07 -------------------------------------------------------------------------------- Multi-Disciplinary Care Plan Details Patient Name: Date of Service: [name] Oxford D. 02/08/2022 8:30 A M Medical Record Number: [id number] Patient Account Number: [id number] Date of Birth/Sex: Treating RN: [birth year] (56 y.o. [name] Rowan, Lauren Primary Care Christelle Igoe: Other Clinician: PA TEL, RUTWIK Referring Coston Mandato: Treating Jashua Knaak/Extender: Geralyn Corwin PA TEL, RUTWIK Weeks in Treatment: 1 Active Inactive Wound/Skin Impairment Nursing Diagnoses: Impaired tissue integrity Goals: Patient/caregiver will verbalize understanding of skin care regimen Date Initiated: 01/26/2022 Target Resolution Date: 02/22/2022 Goal Status: Active Ulcer/skin breakdown will have a volume reduction of 30% by week 4 Date Initiated: 01/26/2022 Target Resolution Date: 02/22/2022 Goal Status: Active Interventions: Assess patient/caregiver ability to obtain necessary supplies Assess patient/caregiver ability to perform ulcer/skin care regimen upon admission and as needed Assess  ulceration(s) every visit Provide education on ulcer and skin care Treatment Activities: Topical wound management initiated : 01/26/2022 Notes: Electronic Signature(s) Signed: 02/08/2022 4:17:36 PM By: [name] Entered By: Fonnie Mu on 02/08/2022 09:03:08 -------------------------------------------------------------------------------- Pain Assessment Details Patient Name: Date of Service: [name] Oxford D. 02/08/2022 8:30 A M Medical Record Number: [id number] Patient Account Number: [id number] Date of Birth/Sex: Treating RN: 1966-08-10 (56 y.o. [name] Rowan, Lauren Primary Care Dolores Mcgovern: PA TEL, RUTWIK Other Clinician: Referring Montine Hight: Treating Shaquel Josephson/Extender: Geralyn Corwin PA TEL, RUTWIK Weeks in Treatment: 1 Active Problems Location of Pain Severity and Description of Pain Patient Has Paino No Site Locations Pain Management and Medication Current Pain Management: Electronic Signature(s) Signed: 02/08/2022 4:17:36 PM By: [name] Entered By: Fonnie Mu on 02/08/2022 08:21:41 -------------------------------------------------------------------------------- Patient/Caregiver Education Details Patient Name: Date of Service: [place] [name] Sheriff 3/2/2023andnbsp8:30 A M Medical Record Number: [id number] Patient Account Number: [id number] Date of Birth/Gender: Treating RN: 12/27/1965 (56 y.o. [name] Primary Care Physician: PA TEL,  RUTWIK Other Clinician: Referring Physician: Treating Physician/Extender: Geralyn Corwin PA TEL, RUTWIK Weeks in Treatment: 1 Education Assessment Education Provided To: Patient Education Topics Provided Wound/Skin Impairment: Methods: Explain/Verbal Responses: Reinforcements needed, State content correctly Electronic Signature(s) Signed: 02/08/2022 4:17:36 PM By: [name] Entered By: Fonnie Mu on 02/08/2022  09:03:30 -------------------------------------------------------------------------------- Wound Assessment Details Patient Name: Date of Service: [name] Oxford D. 02/08/2022 8:30 A M Medical Record Number: [id number] Patient Account Number: [id number] Date of Birth/Sex: Treating RN: 04-24-1966 (56 y.o. [name] Rowan, Lauren Primary Care Mirinda Monte: PA TEL, RUTWIK Other Clinician: Referring Ansh Fauble: Treating Celes Dedic/Extender: Geralyn Corwin PA TEL, RUTWIK Weeks in Treatment: 1 Wound Status Wound Number: 1 Primary Etiology: Dehisced Wound Wound Location: Back Wound Status: Open Wounding Event: Surgical Injury Comorbid History: Hypertension, Type I Diabetes, Neuropathy Date Acquired: 12/14/2021 Weeks Of Treatment: 1 Clustered Wound: No Photos Wound Measurements Length: (cm) 4.2 Width: (cm) 1 Depth: (cm) 0.7 Area: (cm) 3.299 Volume: (cm) 2.309 % Reduction in Area: 56.2% % Reduction in Volume: -2.1% Epithelialization: Small (1-33%) Tunneling: No Undermining: No Wound Description Classification: Full Thickness Without Exposed Support Structures Wound Margin: Well defined, not attached Exudate Amount: Medium Exudate Type: Serosanguineous Exudate Color: red, brown Foul Odor After Cleansing: No Slough/Fibrino Yes Wound Bed Granulation Amount: Medium (34-66%) Exposed Structure Granulation Quality: Red, Pink Fascia Exposed: No Necrotic Amount: Medium (34-66%) Fat Layer (Subcutaneous Tissue) Exposed: Yes Necrotic Quality: Adherent Slough Tendon Exposed: No Muscle Exposed: No Joint Exposed: No Bone Exposed: No Treatment Notes Wound #1 (Back) Cleanser Soap and Water Discharge Instruction: May shower and wash wound with dial antibacterial soap and water prior to dressing change. Wound Cleanser Discharge Instruction: Anasept.Marland KitchenMarland KitchenCleanse the wound with wound cleanser prior to applying a clean dressing using gauze sponges, not tissue or cotton balls. Peri-Wound Care Skin  Prep Discharge Instruction: Use skin prep as directed Topical Primary Dressing MediHoney Gel, tube 1.5 (oz) Discharge Instruction: Apply to wound bed as instructed Secondary Dressing Woven Gauze Sponges 2x2 in Discharge Instruction: Apply over primary dressing as directed. Zetuvit Plus Silicone Border Dressing 7x7(in/in) Discharge Instruction: Apply silicone border over primary dressing as directed. Secured With Compression Wrap Compression Stockings Facilities manager) Signed: 02/08/2022 4:17:36 PM By: [name] Signed: 02/08/2022 5:00:00 PM By: Antonieta Iba Entered By: Antonieta Iba on 02/08/2022 08:26:03 -------------------------------------------------------------------------------- Wound Assessment Details Patient Name: Date of Service: [name] Oxford D. 02/08/2022 8:30 A M Medical Record Number: [id number] Patient Account Number: [id number] Date of Birth/Sex: Treating RN: 1966-08-05 (56 y.o. [name] Rowan, Lauren Primary Care Toshia Larkin: PA TEL, RUTWIK Other Clinician: Referring Marry Kusch: Treating Layli Capshaw/Extender: Geralyn Corwin PA TEL, RUTWIK Weeks in Treatment: 1 Wound Status Wound Number: 2 Primary Etiology: Open Surgical Wound Wound Location: Distal Back Wound Status: Healed - Epithelialized Wounding Event: Surgical Injury Comorbid History: Hypertension, Type I Diabetes, Neuropathy Date Acquired: 02/02/2022 Weeks Of Treatment: 0 Clustered Wound: No Photos Wound Measurements Length: (cm) Width: (cm) Depth: (cm) Area: (cm) Volume: (cm) 0 % Reduction in Area: 100% 0 % Reduction in Volume: 100% 0 Epithelialization: Large (67-100%) 0 Tunneling: No 0 Undermining: No Wound Description Classification: Full Thickness Without Exposed Support Structures Wound Margin: Distinct, outline attached Exudate Amount: None Present Foul Odor After Cleansing: No Slough/Fibrino No Wound Bed Granulation Amount: None Present (0%) Exposed  Structure Necrotic Amount: None Present (0%) Fascia Exposed: No Fat Layer (Subcutaneous Tissue) Exposed: No Tendon Exposed: No Muscle Exposed: No Joint Exposed: No Bone Exposed: No Electronic Signature(s) Signed: 02/08/2022 4:17:36 PM By: [name] Signed: 02/08/2022 5:00:00  PM By: Bo Mcclintock By: Antonieta Iba on 02/08/2022 08:26:34 -------------------------------------------------------------------------------- Vitals Details Patient Name: Date of Service: MA Con Memos D. 02/08/2022 8:30 A M Medical Record Number: [id number] Patient Account Number: [id number] Date of Birth/Sex: Treating RN: October 29, 1966 (56 y.o. [name] Rowan, Lauren Primary Care Danyla Wattley: PA TEL, RUTWIK Other Clinician: Referring Trinady Milewski: Treating Darely Becknell/Extender: Geralyn Corwin PA TEL, RUTWIK Weeks in Treatment: 1 Vital Signs Time Taken: 08:21 Temperature (F): 98.2 Height (in): 58 Pulse (bpm): 82 Weight (lbs): 194 Respiratory Rate (breaths/min): 17 Body Mass Index (BMI): 40.5 Blood Pressure (mmHg): 145/85 Capillary Blood Glucose (mg/dl): 914 Reference Range: 80 - 120 mg / dl Electronic Signature(s) Signed: 02/08/2022 4:17:36 PM By: [name] Entered By: Fonnie Mu on 02/08/2022 08:22:33

## 2022-02-16 ENCOUNTER — Institutional Professional Consult (permissible substitution): Payer: No Typology Code available for payment source | Admitting: Pulmonary Disease

## 2022-02-22 ENCOUNTER — Other Ambulatory Visit: Payer: Self-pay

## 2022-02-22 ENCOUNTER — Encounter (HOSPITAL_BASED_OUTPATIENT_CLINIC_OR_DEPARTMENT_OTHER): Payer: No Typology Code available for payment source | Admitting: Internal Medicine

## 2022-02-22 DIAGNOSIS — L98428 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: Secondary | ICD-10-CM

## 2022-02-23 NOTE — Progress Notes (Signed)
[name], [name] ([id number]) ?Visit Report for 02/22/2022 ?Arrival Information Details ?Patient Name: Date of Service: ?[name] 02/22/2022 8:45 A M ?Medical Record Number: [id number] ?Patient Account Number: [id number] ?Date of Birth/Sex: Treating RN: ?Apr 30, 1966 (56 y.o. F) Redmond Pulling ?Primary Care Amoreena Neubert: Trena Platt Other Clinician: ?Referring Barbera Perritt: ?Treating Laquilla Dault/Extender: Geralyn Corwin ?Allena Katz, Rutwik ?Weeks in Treatment: 3 ?Visit Information History Since Last Visit ?Added or deleted any medications: No ?Patient Arrived: Ambulatory ?Any new allergies or adverse reactions: No ?Arrival Time: 08:22 ?Had a fall or experienced change in No ?Accompanied By: self ?activities of daily living that may affect ?Transfer Assistance: None ?risk of falls: ?Patient Identification Verified: Yes ?Signs or symptoms of abuse/neglect since last visito No ?Secondary Verification Process Completed: Yes ?Hospitalized since last visit: No ?Patient Requires Transmission-Based Precautions: No ?Implantable device outside of the clinic excluding No ?Patient Has Alerts: No ?cellular tissue based products placed in the center ?since last visit: ?Has Dressing in Place as Prescribed: Yes ?Pain Present Now: No ?Electronic Signature(s) ?Signed: 02/22/2022 5:06:01 PM By: Redmond Pulling RN, BSN ?Entered By: Redmond Pulling on 02/22/2022 08:23:06 ?-------------------------------------------------------------------------------- ?Encounter Discharge Information Details ?Patient Name: Date of Service: ?[name] 02/22/2022 8:45 A M ?Medical Record Number: [id number] ?Patient Account Number: [id number] ?Date of Birth/Sex: Treating RN: ?[birth year] (56 y.o. Ardis Rowan, Lauren ?Primary Care Gabbriella Presswood: Trena Platt Other Clinician: ?Referring Genessis Flanary: ?Treating Kennisha Qin/Extender: Geralyn Corwin ?Allena Katz, Rutwik ?Weeks in Treatment: 3 ?Encounter Discharge Information Items Post Procedure Vitals ?Discharge Condition:  Stable ?Temperature (F): 98.7 ?Ambulatory Status: Ambulatory ?Pulse (bpm): 74 ?Discharge Destination: Home ?Respiratory Rate (breaths/min): 17 ?Transportation: Private Auto ?Blood Pressure (mmHg): 134/74 ?Accompanied By: self ?Schedule Follow-up Appointment: Yes ?Clinical Summary of Care: Patient Declined ?Electronic Signature(s) ?Signed: 02/23/2022 12:57:05 PM By: [name] ?Entered By: Fonnie Mu on 02/22/2022 09:25:13 ?-------------------------------------------------------------------------------- ?Lower Extremity Assessment Details ?Patient Name: ?Date of Service: ?[name]. 02/22/2022 8:45 A M ?Medical Record Number: [id number] ?Patient Account Number: [id number] ?Date of Birth/Sex: ?Treating RN: ?[birth year] (56 y.o. F) Redmond Pulling ?Primary Care Adryel Wortmann: Trena Platt ?Other Clinician: ?Referring Jason Hauge: ?Treating Sheryn Aldaz/Extender: Geralyn Corwin ?Allena Katz, Rutwik ?Weeks in Treatment: 3 ?Electronic Signature(s) ?Signed: 02/22/2022 5:06:01 PM By: Redmond Pulling RN, BSN ?Entered By: Redmond Pulling on 02/22/2022 08:26:13 ?-------------------------------------------------------------------------------- ?Multi Wound Chart Details ?Patient Name: ?Date of Service: ?[name]. 02/22/2022 8:45 A M ?Medical Record Number: [id number] ?Patient Account Number: [id number] ?Date of Birth/Sex: ?Treating RN: ?Apr 19, 1966 (56 y.o. F) ?Primary Care Michaeljames Milnes: Trena Platt ?Other Clinician: ?Referring Aleane Wesenberg: ?Treating Apolonio Cutting/Extender: Geralyn Corwin ?Allena Katz, Rutwik ?Weeks in Treatment: 3 ?Vital Signs ?Height(in): 58 ?Capillary Blood Glucose(mg/dl): 244 ?Weight(lbs): 194 ?Pulse(bpm): 80 ?Body Mass Index(BMI): 40.5 ?Blood Pressure(mmHg): 129/61 ?Temperature(??F): 98.2 ?Respiratory Rate(breaths/min): 18 ?Photos: [N/A:No Photos N/A] ?Back Back N/A ?Wound Location: ?Surgical Injury Surgical Injury N/A ?Wounding Event: ?Dehisced Wound Dehisced Wound N/A ?Primary Etiology: ?Hypertension, Type I Diabetes,  Hypertension, Type I Diabetes, N/A ?Comorbid History: ?Neuropathy Neuropathy ?12/14/2021 12/14/2021 N/A ?Date Acquired: ?3 3 N/A ?Weeks of Treatment: ?Open Open N/A ?Wound Status: ?No No N/A ?Wound Recurrence: ?2x0.4x0.2 2x0.4x0.2 N/A ?Measurements L x W x D (cm) ?0.628 0.628 N/A ?A (cm?) : ?rea ?0.126 0.126 N/A ?Volume (cm?) : ?91.70% 91.70% N/A ?% Reduction in Area: ?94.40% 94.40% N/A ?% Reduction in Volume: ?Full Thickness Without Exposed Full Thickness Without Exposed N/A ?Classification: ?Support Structures Support Structures ?Medium Medium N/A ?Exudate Amount: ?Serosanguineous Serosanguineous N/A ?Exudate Type: ?red, brown red, brown N/A ?Exudate Color: ?Well defined, not attached Flat and Intact N/A ?Wound Margin: ?Medium (  34-66%) Large (67-100%) N/A ?Granulation Amount: ?Red, Pink Red N/A ?Granulation Quality: ?Medium (34-66%) Small (1-33%) N/A ?Necrotic Amount: ?Fat Layer (Subcutaneous Tissue): Yes Fat Layer (Subcutaneous Tissue): Yes N/A ?Exposed Structures: ?Fascia: No ?Fascia: No ?Tendon: No ?Tendon: No ?Muscle: No ?Muscle: No ?Joint: No ?Joint: No ?Bone: No ?Bone: No ?Small (1-33%) Large (67-100%) N/A ?Epithelialization: ?Debridement - Excisional Debridement - Excisional N/A ?Debridement: ?Pre-procedure Verification/Time Out 09:15 09:15 N/A ?Taken: ?Lidocaine Lidocaine N/A ?Pain Control: ?Subcutaneous, Slough Subcutaneous, Slough N/A ?Tissue Debrided: ?Skin/Subcutaneous Tissue Skin/Subcutaneous Tissue N/A ?Level: ?0.8 0.8 N/A ?Debridement A (sq cm): ?rea ?Curette Curette N/A ?Instrument: ?Minimum Minimum N/A ?Bleeding: ?Pressure Pressure N/A ?Hemostasis A chieved: ?0 0 N/A ?Procedural Pain: ?0 0 N/A ?Post Procedural Pain: ?Procedure was tolerated well Procedure was tolerated well N/A ?Debridement Treatment Response: ?2x0.4x0.2 2x0.4x0.2 N/A ?Post Debridement Measurements L x ?W x D (cm) ?0.126 0.126 N/A ?Post Debridement Volume: (cm?) ?Debridement Debridement N/A ?Procedures Performed: ?Treatment  Notes ?Wound #1 (Back) ?Cleanser ?Soap and Water ?Discharge Instruction: May shower and wash wound with dial antibacterial soap and water prior to dressing change. ?Wound Cleanser ?Discharge Instruction: Anasept.Marland Kitchen.Marland Kitchen.Cleanse the wound with wound cleanser prior to applying a clean dressing using gauze sponges, not tissue or ?cotton balls. ?Peri-Wound Care ?Skin Prep ?Discharge Instruction: Use skin prep as directed ?Topical ?Primary Dressing ?MediHoney Gel, tube 1.5 (oz) ?Discharge Instruction: Apply to wound bed as instructed ?Secondary Dressing ?Woven Gauze Sponges 2x2 in ?Discharge Instruction: Apply over primary dressing as directed. ?Zetuvit Plus Silicone Border Dressing 7x7(in/in) ?Discharge Instruction: Apply silicone border over primary dressing as directed. ?Secured With ?Compression Wrap ?Compression Stockings ?Add-Ons ?Electronic Signature(s) ?Signed: 02/22/2022 11:21:32 AM By: Geralyn CorwinHoffman, Jessica DO ?Entered By: Geralyn CorwinHoffman, Jessica on 02/22/2022 10:34:09 ?-------------------------------------------------------------------------------- ?Multi-Disciplinary Care Plan Details ?Patient Name: ?Date of Service: ?[name] Angelina SheriffRTIN, Agapita D. 02/22/2022 8:45 A M ?Medical Record Number: [id number] ?Patient Account Number: [id number] ?Date of Birth/Sex: ?Treating RN: ?07/12/1966 (56 y.o. [name]) Breedlove, Lauren ?Primary Care Usman Millett: ?Other Clinician: ?Allena KatzPatel, Rutwik ?Referring Laveyah Oriol: ?Treating Jacory Kamel/Extender: Geralyn CorwinHoffman, Jessica ?Allena KatzPatel, Rutwik ?Weeks in Treatment: 3 ?Active Inactive ?Wound/Skin Impairment ?Nursing Diagnoses: ?Impaired tissue integrity ?Goals: ?Patient/caregiver will verbalize understanding of skin care regimen ?Date Initiated: 01/26/2022 ?Target Resolution Date: 02/22/2022 ?Goal Status: Active ?Ulcer/skin breakdown will have a volume reduction of 30% by week 4 ?Date Initiated: 01/26/2022 ?Target Resolution Date: 02/22/2022 ?Goal Status: Active ?Interventions: ?Assess patient/caregiver ability to obtain necessary supplies ?Assess  patient/caregiver ability to perform ulcer/skin care regimen upon admission and as needed ?Assess ulceration(s) every visit ?Provide education on ulcer and skin care ?Treatment Activities: ?Topical wound management initiated :

## 2022-02-23 NOTE — Progress Notes (Signed)
DANETT, PALAZZO ([id number]) ?Visit Report for 02/22/2022 ?Chief Complaint Document Details ?Patient Name: Date of Service: ?[place] AILEANA, HODDER 02/22/2022 8:45 A M ?Medical Record Number: [id number] ?Patient Account Number: [id number] ?Date of Birth/Sex: Treating RN: ?1966/02/05 (56 y.o. F) ?Primary Care Provider: Trena Platt Other Clinician: ?Referring Provider: ?Treating Provider/Extender: Geralyn Corwin ?Allena Katz, Rutwik ?Weeks in Treatment: 3 ?Information Obtained from: Patient ?Chief Complaint ?Open wound to the back secondary to surgical wound dehiscence ?Electronic Signature(s) ?Signed: 02/22/2022 11:21:32 AM By: [name] ?Entered By: Geralyn Corwin on 02/22/2022 10:34:19 ?-------------------------------------------------------------------------------- ?Debridement Details ?Patient Name: Date of Service: ?[place] CELESTINA, GIRONDA 02/22/2022 8:45 A M ?Medical Record Number: [id number] ?Patient Account Number: [id number] ?Date of Birth/Sex: Treating RN: ?June 12, 1966 (56 y.o. Ardis Rowan, Lauren ?Primary Care Provider: Trena Platt Other Clinician: ?Referring Provider: ?Treating Provider/Extender: Geralyn Corwin ?Allena Katz, Rutwik ?Weeks in Treatment: 3 ?Debridement Performed for Assessment: Wound #1 Back ?Performed By: Physician Geralyn Corwin, DO ?Debridement Type: Debridement ?Level of Consciousness (Pre-procedure): Awake and Alert ?Pre-procedure Verification/Time Out Yes - 09:15 ?Taken: ?Start Time: 09:15 ?Pain Control: Lidocaine ?T Area Debrided (L x W): ?otal 2 (cm) x 0.4 (cm) = 0.8 (cm?) ?Tissue and other material debrided: Viable, Non-Viable, Slough, Subcutaneous, Slough ?Level: Skin/Subcutaneous Tissue ?Debridement Description: Excisional ?Instrument: Curette ?Bleeding: Minimum ?Hemostasis Achieved: Pressure ?End Time: 09:15 ?Procedural Pain: 0 ?Post Procedural Pain: 0 ?Response to Treatment: Procedure was tolerated well ?Level of Consciousness (Post- Awake and Alert ?procedure): ?Post Debridement Measurements  of Total Wound ?Length: (cm) 2 ?Width: (cm) 0.4 ?Depth: (cm) 0.2 ?Volume: (cm?) 0.126 ?Character of Wound/Ulcer Post Debridement: Improved ?Post Procedure Diagnosis ?Same as Pre-procedure ?Electronic Signature(s) ?Signed: 02/22/2022 11:21:32 AM By: [name] ?Signed: 02/23/2022 12:57:05 PM By: [name] ?Entered By: Fonnie Mu on 02/22/2022 09:08:19 ?-------------------------------------------------------------------------------- ?HPI Details ?Patient Name: Date of Service: ?[place] CANARY, FISTER 02/22/2022 8:45 A M ?Medical Record Number: [id number] ?Patient Account Number: [id number] ?Date of Birth/Sex: Treating RN: ?1966-09-01 (57 y.o. F) ?Primary Care Provider: Trena Platt Other Clinician: ?Referring Provider: ?Treating Provider/Extender: Geralyn Corwin ?Allena Katz, Rutwik ?Weeks in Treatment: 3 ?History of Present Illness ?HPI Description: 01/26/2022 ?Ms. 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 She has a past medical history of type 2 diabetes currently controlled on an ?insulin pump, morbid obesity, spinal stenosis and OSA that presents to the clinic for a 1-69-month history of nonhealing wound to her back. She had surgery on ?12/05/2021 with Dr. Maurice Small for spondylolisthesis with lumbar radiculopathy and had an L5-S1 open TLIF and posterior lateral instrumented fusion. She ?subsequently developed a hematoma and had to have this evacuated on 12/14/2021. She developed wound dehiscence after the surgery and has an open wound ?to her back. She has not been doing any wound care to the area. She does not even keep this covered. She currently denies signs of infection. She has some ?chronic pain to the area. ?2/24; patient presents for follow-up. She could not obtain Santyl due to cost. She started Medihoney. She has no issues or complaints today. She denies signs ?of infection. ?3/2; patient presents for follow-up. She continues to use Medihoney without issues. She  denies signs of infection. ?3/16; patient presents for follow-up. She continues to use Medihoney to the wound bed. She went back to work and has had no issues doing so. ?Electronic Signature(s) ?Signed: 02/22/2022 11:21:32 AM By: [name] ?Entered By: Geralyn Corwin on 02/22/2022 10:34:41 ?-------------------------------------------------------------------------------- ?Physical Exam Details ?Patient Name: Date of Service: ?MA RTIN, Vessie D.  02/22/2022 8:45 A M ?Medical Record Number: [id number] ?Patient Account Number: [id number] ?Date of Birth/Sex: Treating RN: ?07/20/1966 (56 y.o. F) ?Primary Care Provider: Trena PlattPatel, Rutwik Other Clinician: ?Referring Provider: ?Treating Provider/Extender: Geralyn CorwinHoffman, Kashawna Manzer ?Allena KatzPatel, Rutwik ?Weeks in Treatment: 3 ?Constitutional ?respirations regular, non-labored and within target range for patient.Marland Kitchen. ?Psychiatric ?pleasant and cooperative. ?Notes ?Back: T the lumbar region she has an elongated wound along a previous surgical incision site. There is granulation tissue present along with nonviable tissue. ?o ?Most of the nonviable tissue was tightly adhered. No tenderness on palpation. No drainage noted. No surrounding signs of infection. ?Electronic Signature(s) ?Signed: 02/22/2022 11:21:32 AM By: Geralyn CorwinHoffman, Tomio Kirk DO ?Entered By: Geralyn CorwinHoffman, Denissa Cozart on 02/22/2022 10:35:19 ?-------------------------------------------------------------------------------- ?Physician Orders Details ?Patient Name: ?Date of Service: ?MA Angelina SheriffRTIN, Remingtyn D. 02/22/2022 8:45 A M ?Medical Record Number: [id number] ?Patient Account Number: [id number] ?Date of Birth/Sex: ?Treating RN: ?04/17/1966 (56 y.o. [name]) Breedlove, Lauren ?Primary Care Provider: Trena PlattPatel, Rutwik ?Other Clinician: ?Referring Provider: ?Treating Provider/Extender: Geralyn CorwinHoffman, Adeleine Pask ?Allena KatzPatel, Rutwik ?Weeks in Treatment: 3 ?Verbal / Phone Orders: No ?Diagnosis Coding ?Follow-up Appointments ?ppointment in 2 weeks. - with Dr. Mikey BussingHoffman and Maryruth BunLauran in Room # 9 ?Return  A ?Bathing/ Shower/ Hygiene ?May shower and wash wound with soap and water. - when changing dressing ?Additional Orders / Instructions ?Follow Nutritious Diet - Continue to monitor/control Blood Sugar ?Other: - [place]=Supplies ?Wound Treatment ?Wound #1 - Back ?Cleanser: Soap and Water 1 x Per Day/30 Days ?Discharge Instructions: May shower and wash wound with dial antibacterial soap and water prior to dressing change. ?Cleanser: Wound Cleanser (Generic) 1 x Per Day/30 Days ?Discharge Instructions: Anasept.Marland Kitchen.Marland Kitchen.Cleanse the wound with wound cleanser prior to applying a clean dressing using gauze sponges, not tissue or ?cotton balls. ?Peri-Wound Care: Skin Prep (Generic) 1 x Per Day/30 Days ?Discharge Instructions: Use skin prep as directed ?Prim Dressing: MediHoney Gel, tube 1.5 (oz) 1 x Per Day/30 Days ?ary ?Discharge Instructions: Apply to wound bed as instructed ?Secondary Dressing: Woven Gauze Sponges 2x2 in (Generic) 1 x Per Day/30 Days ?Discharge Instructions: Apply over primary dressing as directed. ?Secondary Dressing: Zetuvit Plus Silicone Border Dressing 7x7(in/in) (Generic) 1 x Per Day/30 Days ?Discharge Instructions: Apply silicone border over primary dressing as directed. ?Electronic Signature(s) ?Signed: 02/22/2022 11:21:32 AM By: Geralyn CorwinHoffman, Jermiyah Ricotta DO ?Entered By: Geralyn CorwinHoffman, Nardos Putnam on 02/22/2022 10:35:38 ?-------------------------------------------------------------------------------- ?Problem List Details ?Patient Name: ?Date of Service: ?MA Angelina SheriffRTIN, Ndeye D. 02/22/2022 8:45 A M ?Medical Record Number: [id number] ?Patient Account Number: [id number] ?Date of Birth/Sex: ?Treating RN: ?11/15/1966 (56 y.o. F) ?Primary Care Provider: Trena PlattPatel, Rutwik ?Other Clinician: ?Referring Provider: ?Treating Provider/Extender: Geralyn CorwinHoffman, Jowel Waltner ?Allena KatzPatel, Rutwik ?Weeks in Treatment: 3 ?Active Problems ?ICD-10 ?Encounter ?Code Description Active Date MDM ?Diagnosis ?Y86.57898.428 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  01/26/2022 No Yes ?T81.31XA Disruption of external operation (surgical) wound, not elsewhere classified, 01/26/2022 No Yes ?initial encounter ?E11.622 Type 2 diabetes mellitus with other skin ulcer 2/17/

## 2022-03-05 ENCOUNTER — Encounter: Payer: Self-pay | Admitting: "Endocrinology

## 2022-03-05 ENCOUNTER — Ambulatory Visit (INDEPENDENT_AMBULATORY_CARE_PROVIDER_SITE_OTHER): Payer: No Typology Code available for payment source | Admitting: "Endocrinology

## 2022-03-05 ENCOUNTER — Other Ambulatory Visit: Payer: Self-pay

## 2022-03-05 VITALS — BP 120/74 | HR 80 | Ht 60.5 in | Wt 195.0 lb

## 2022-03-05 DIAGNOSIS — Z794 Long term (current) use of insulin: Secondary | ICD-10-CM | POA: Diagnosis not present

## 2022-03-05 DIAGNOSIS — I10 Essential (primary) hypertension: Secondary | ICD-10-CM

## 2022-03-05 DIAGNOSIS — E1169 Type 2 diabetes mellitus with other specified complication: Secondary | ICD-10-CM

## 2022-03-05 DIAGNOSIS — E782 Mixed hyperlipidemia: Secondary | ICD-10-CM | POA: Diagnosis not present

## 2022-03-05 LAB — POCT GLYCOSYLATED HEMOGLOBIN (HGB A1C): HbA1c, POC (controlled diabetic range): 7.7 % — AB (ref 0.0–7.0)

## 2022-03-05 MED ORDER — SYNJARDY 12.5-500 MG PO TABS
1.0000 | ORAL_TABLET | Freq: Two times a day (BID) | ORAL | 2 refills | Status: DC
Start: 2022-03-05 — End: 2022-03-12

## 2022-03-05 MED ORDER — TRULICITY 1.5 MG/0.5ML ~~LOC~~ SOAJ: 1.5000 mg | SUBCUTANEOUS | 2 refills | Status: DC

## 2022-03-05 NOTE — Progress Notes (Signed)
? ?                                                             Endocrinology Consult Note  ?     03/05/2022, 5:13 PM ? ? ?Subjective:  ? ? Patient ID: [name], female    DOB: [birth year].  ?[name] is being seen in consultation for management of currently uncontrolled symptomatic diabetes requested by  Lindell Spar, MD. ? ? ?Past Medical History:  ?Diagnosis Date  ? Diabetes mellitus   ? insulin pump 2013  ? Dysphagia 03/09/2020  ? Hypercholesteremia   ? Hypertension   ? Incarcerated umbilical hernia 1/32/4401  ? Kidney stone   ? Kidney stones 08/31/2008  ? Qualifier: Diagnosis of  By: Jonna Munro MD, Roderic Scarce    ? Neuropathy   ? Obesity   ? Obstructive sleep apnea   ? Palpitations   ? Shortness of breath   ? ? ?Past Surgical History:  ?Procedure Laterality Date  ? BIOPSY  06/23/2020  ? Procedure: BIOPSY;  Surgeon: Daneil Dolin, MD;  [facility];  Service: Endoscopy;;  ? CARDIAC CATHETERIZATION  12/11/2012  ? normal coronary arteries  ? CESAREAN SECTION  1994;2000  ? [facility]  ? CHOLECYSTECTOMY  1992  ? COLONOSCOPY WITH PROPOFOL N/A 06/23/2020  ? large amount of stool in entire colon, prep inadequate.   ? CYSTOSCOPY W/ URETERAL STENT PLACEMENT  05/08/2012  ? Procedure: CYSTOSCOPY WITH RETROGRADE PYELOGRAM/URETERAL STENT PLACEMENT;  Surgeon: Marissa Nestle, MD;  Location: [facility];  Service: Urology;  Laterality: Right;  ? CYSTOSCOPY WITH RETROGRADE PYELOGRAM, URETEROSCOPY AND STENT PLACEMENT Right 05/04/2020  ? Procedure: CYSTOSCOPY WITH RIGHT RETROGRADE PYELOGRAM, RIGHT URETEROSCOPY AND RIGHT URETERAL STENT EXCHANGE;  Surgeon: Cleon Gustin, MD;  Location: [facility];  Service: Urology;  Laterality: Right;  ? CYSTOSCOPY WITH RETROGRADE PYELOGRAM, URETEROSCOPY AND STENT PLACEMENT Left 08/08/2020  ? Procedure: CYSTOSCOPY WITH RETROGRADE PYELOGRAM, URETEROSCOPY WITH LASER  AND STENT PLACEMENT;  Surgeon: Cleon Gustin, MD;  Location: [facility];  Service: Urology;  Laterality:  Left;  ? CYSTOSCOPY WITH RETROGRADE PYELOGRAM, URETEROSCOPY AND STENT PLACEMENT Right 11/08/2020  ? Procedure: CYSTOSCOPY WITH RIGHT RETROGRADE PYELOGRAM, RIGHT URETEROSCOPY AND STENT PLACEMENT;  Surgeon: Cleon Gustin, MD;  [facility]: [facility];  Service: Urology;  Laterality: Right;  ? CYSTOSCOPY WITH RETROGRADE PYELOGRAM, URETEROSCOPY AND STENT PLACEMENT Left 12/27/2020  ? Procedure: CYSTOSCOPY WITH LEFT RETROGRADE PYELOGRAM, LEFT URETEROSCOPY AND LEFT URETERAL STENT PLACEMENT;  Surgeon: Cleon Gustin, MD;  [facility];  Service: Urology;  Laterality: Left;  ? CYSTOSCOPY WITH RETROGRADE PYELOGRAM, URETEROSCOPY AND STENT PLACEMENT Bilateral 03/16/2021  ? Procedure: CYSTOSCOPY WITH BILATERAL  RETROGRADE PYELOGRAM, BILATERAL URETEROSCOPY AND STENT PLACEMENT ON THE LEFT;  Surgeon: Cleon Gustin, MD;  [facility]: [facility];  Service: Urology;  Laterality: Bilateral;  ? CYSTOSCOPY WITH STENT PLACEMENT Right 02/25/2020  ? Procedure: CYSTOSCOPY WITH RIGHT RETROGRADE RIGHT STENT PLACEMENT;  Surgeon: Festus Aloe, MD;  Location: [facility];  Service: Urology;  Laterality: Right;  ? ESOPHAGOGASTRODUODENOSCOPY (EGD) WITH PROPOFOL N/A 06/23/2020  ? Normal esophagus, multiple localized erosions were found in gastric antrum s/p biopsy, normal duodenum. Empiric dilation.Reactive gastropathy with erosions, negative H.pylori, no dysplasia.    ? EXTRACORPOREAL SHOCK WAVE LITHOTRIPSY Right 10/11/2020  ?  Procedure: EXTRACORPOREAL SHOCK WAVE LITHOTRIPSY (ESWL);  Surgeon: Cleon Gustin, MD;  Location: [facility];  Service: Urology;  Laterality: Right;  ? FOOT SURGERY Right March, 2013  ? [facility]-removal of bone spur  ? HOLMIUM LASER APPLICATION  9/76/7341  ? Procedure: HOLMIUM LASER LITHOTRIPSY RIGHT URETERAL CALCULUS;  Surgeon: Cleon Gustin, MD;  [facility]: [facility];  Service: Urology;;  ? HYSTEROSCOPY WITH THERMACHOICE  04/25/2012  ? Procedure: HYSTEROSCOPY WITH THERMACHOICE;  Surgeon: Florian Buff, MD;   Location: [facility];  Service: Gynecology;  Laterality: N/A;  total therapy time= 11 minutes 42 seconds; 34 ml D5w  in and 34 ml D5w out; temp =87 degrees F  ? LEFT HEART CATHETERIZATION WITH CORONARY ANGIOGRAM N/A 12/11/2012  ? Procedure: LEFT HEART CATHETERIZATION WITH CORONARY ANGIOGRAM;  Surgeon: Lorretta Harp, MD;  Location: [facility];  Service: Cardiovascular;  Laterality: N/A;  ? LUMBAR WOUND DEBRIDEMENT N/A 12/14/2021  ? Procedure: LUMBAR HEMATOMA EVACUATION;  Surgeon: Judith Part, MD;  Location: [place];  Service: Neurosurgery;  Laterality: N/A;  ? MALONEY DILATION N/A 06/23/2020  ? Procedure: MALONEY DILATION;  Surgeon: Daneil Dolin, MD;  [facility];  Service: Endoscopy;  Laterality: N/A;  ? Sinus sergery  1988  ? [place]  ? STONE EXTRACTION WITH BASKET Right 11/08/2020  ? Procedure: STONE EXTRACTION WITH BASKET;  Surgeon: Cleon Gustin, MD;  Location: [facility];  Service: Urology;  Laterality: Right;  ? TRANSFORAMINAL LUMBAR INTERBODY FUSION (TLIF) WITH PEDICLE SCREW FIXATION 1 LEVEL N/A 12/05/2021  ? Procedure: Open Lumbar five-Sacral one Laminectomy/Transforaminal Lumbar Interbody Fusion/Posterolateral instrumented fusion;  Surgeon: Judith Part, MD;  Location: [place];  Service: Neurosurgery;  Laterality: N/A;  3C  ? UMBILICAL HERNIA REPAIR N/A 03/25/2014  ? Procedure: UMBILICAL HERNIA REPAIR;  Surgeon: Scherry Ran, MD;  Location: [facility];  Service: General;  Laterality: N/A;  ? uterine ablation    ? ? ?Social History  ? ?Socioeconomic History  ? Marital status: Married  ?  Spouse name: Not on file  ? Number of children: 2  ? Years of education: college  ? Highest education level: Not on file  ?Occupational History  ? Occupation: CNA  ?  Employer: [employer]  ?  Employer: [employer]  ? Occupation: Quarry manager- at [employer]  ?Tobacco Use  ? Smoking status: Never  ? Smokeless tobacco: Never  ?Vaping Use  ? Vaping Use: Never used  ?Substance and Sexual Activity  ?  Alcohol use: No  ? Drug use: No  ? Sexual activity: Not Currently  ?  Partners: Male  ?  Birth control/protection: Post-menopausal  ?  Comment: ablation  ?Other Topics Concern  ? Not on file  ?Social History Narrative  ? Regular exercise: walks Caffeine use:   ? ?Social Determinants of Health  ? ?Financial Resource Strain: Low Risk   ? Difficulty of Paying Living Expenses: Not hard at all  ?Food Insecurity: Food Insecurity Present  ? Worried About Charity fundraiser in the Last Year: Sometimes true  ? Ran Out of Food in the Last Year: Never true  ?Transportation Needs: No Transportation Needs  ? Lack of Transportation (Medical): No  ? Lack of Transportation (Non-Medical): No  ?Physical Activity: Insufficiently Active  ? Days of Exercise per Week: 2 days  ? Minutes of Exercise per Session: 20 min  ?Stress: Stress Concern Present  ? Feeling of Stress : To some extent  ?Social Connections: Moderately Integrated  ?  Frequency of Communication with Friends and Family: Three times a week  ? Frequency of Social Gatherings with Friends and Family: Twice a week  ? Attends Religious Services: 1 to 4 times per year  ? Active Member of Clubs or Organizations: No  ? Attends Archivist Meetings: Never  ? Marital Status: Married  ? ? ?Family History  ?Problem Relation Age of Onset  ? Hypertension Mother   ? Cancer Mother   ? Diabetes Mother   ? Stroke Mother   ? Diabetes Father   ? Cancer Paternal Uncle   ? Depression Paternal Grandmother   ? Depression Paternal Grandfather   ? Heart disease Other   ? Cancer Other   ? Diabetes Other   ? Achalasia Other   ? Anesthesia problems Neg Hx   ? Hypotension Neg Hx   ? Malignant hyperthermia Neg Hx   ? Pseudochol deficiency Neg Hx   ? ? ?Outpatient Encounter Medications as of 03/05/2022  ?Medication Sig  ? Dulaglutide (TRULICITY) 1.5 QS/4.7ZX SOPN Inject 1.5 mg into the skin once a week.  ? Empagliflozin-metFORMIN HCl (SYNJARDY) 12.5-500 MG TABS Take 1 tablet by mouth 2 (two) times  daily.  ? albuterol (VENTOLIN HFA) 108 (90 Base) MCG/ACT inhaler Inhale 2 puffs into the lungs every 6 (six) hours as needed for wheezing or shortness of breath.   ? blood glucose meter kit and supplies U

## 2022-03-05 NOTE — Patient Instructions (Signed)

## 2022-03-06 ENCOUNTER — Other Ambulatory Visit (HOSPITAL_COMMUNITY): Payer: Self-pay

## 2022-03-06 ENCOUNTER — Encounter (HOSPITAL_BASED_OUTPATIENT_CLINIC_OR_DEPARTMENT_OTHER): Payer: No Typology Code available for payment source | Admitting: Internal Medicine

## 2022-03-06 DIAGNOSIS — L98428 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: Secondary | ICD-10-CM

## 2022-03-06 DIAGNOSIS — E11622 Type 2 diabetes mellitus with other skin ulcer: Secondary | ICD-10-CM

## 2022-03-06 DIAGNOSIS — T8131XA Disruption of external operation (surgical) wound, not elsewhere classified, initial encounter: Secondary | ICD-10-CM

## 2022-03-06 DIAGNOSIS — M48062 Spinal stenosis, lumbar region with neurogenic claudication: Secondary | ICD-10-CM | POA: Diagnosis not present

## 2022-03-06 NOTE — Progress Notes (Signed)
[name], [name] ([id number]) ?Visit Report for Alvarado ?Arrival Information Details ?Patient Name: Date of Service: ?[name] [name] SheriffRTIN, [name] 9:30 A M ?Medical Record Number: [id number] ?Patient Account Number: [id number] ?Date of Birth/Sex: Treating RN: ?10/22/1966 (56 y.o. [name]) Breedlove, Lauren ?Primary Care [place]: Trena PlattPatel, [name] Other Clinician: ?Referring Theoden Mauch: ?Treating Tishia Maestre/Extender: Geralyn CorwinHoffman, Jessica ?Allena KatzPatel, [name] ?Weeks in Treatment: 5 ?Visit Information History Since Last Visit ?Added or deleted any medications: No ?Patient Arrived: Ambulatory ?Any new allergies or adverse reactions: No ?Arrival Time: 09:18 ?Had a fall or experienced change in No ?Accompanied By: self ?activities of daily living that may affect ?Transfer Assistance: None ?risk of falls: ?Patient Identification Verified: Yes ?Signs or symptoms of abuse/neglect since last visito No ?Secondary Verification Process Completed: Yes ?Hospitalized since last visit: No ?Patient Requires Transmission-Based Precautions: No ?Implantable device outside of the clinic excluding No ?Patient Has Alerts: No ?cellular tissue based products placed in the center ?since last visit: ?Has Dressing in Place as Prescribed: Yes ?Pain Present Now: No ?Electronic Signature(s) ?Signed: 03/06/2022 4:01:08 PM By: Fonnie MuBreedlove, Lauren RN ?Entered By: Fonnie MuBreedlove, Lauren on 03/06/2022 09:18:53 ?-------------------------------------------------------------------------------- ?Clinic Level of Care Assessment Details ?Patient Name: Date of Service: ?[name] [name] SheriffRTIN, [name] 9:30 A M ?Medical Record Number: [id number] ?Patient Account Number: [id number] ?Date of Birth/Sex: Treating RN: ?01/17/1966 (56 y.o. [name]) Breedlove, Lauren ?Primary Care [place]: Trena PlattPatel, [name] Other Clinician: ?Referring Johanny Segers: ?Treating Jalesia Loudenslager/Extender: Geralyn CorwinHoffman, Jessica ?Allena KatzPatel, [name] ?Weeks in Treatment: 5 ?Clinic Level of Care Assessment Items ?TOOL 4 Quantity Score ?X- 1 0 ?Use when only an EandM  is performed on FOLLOW-UP visit ?ASSESSMENTS - Nursing Assessment / Reassessment ?X- 1 10 ?Reassessment of Co-morbidities (includes updates in patient status) ?X- 1 5 ?Reassessment of Adherence to Treatment Plan ?ASSESSMENTS - Wound and Skin A ssessment / Reassessment ?X - Simple Wound Assessment / Reassessment - one wound 1 5 ?[]  - 0 ?Complex Wound Assessment / Reassessment - multiple wounds ?[]  - 0 ?Dermatologic / Skin Assessment (not related to wound area) ?ASSESSMENTS - Focused Assessment ?[]  - 0 ?Circumferential Edema Measurements - multi extremities ?[]  - 0 ?Nutritional Assessment / Counseling / Intervention ?[]  - 0 ?Lower Extremity Assessment (monofilament, tuning fork, pulses) ?[]  - 0 ?Peripheral Arterial Disease Assessment (using hand held doppler) ?ASSESSMENTS - Ostomy and/or Continence Assessment and Care ?[]  - 0 ?Incontinence Assessment and Management ?[]  - 0 ?Ostomy Care Assessment and Management (repouching, etc.) ?PROCESS - Coordination of Care ?X - Simple Patient / Family Education for ongoing care 1 15 ?[]  - 0 ?Complex (extensive) Patient / Family Education for ongoing care ?X- 1 10 ?Staff obtains Consents, Records, T Results / Process Orders ?est ?[]  - 0 ?Staff telephones HHA, Nursing Homes / Clarify orders / etc ?[]  - 0 ?Routine Transfer to another Facility (non-emergent condition) ?[]  - 0 ?Routine Hospital Admission (non-emergent condition) ?[]  - 0 ?New Admissions / Manufacturing engineernsurance Authorizations / Ordering NPWT Apligraf, etc. ?, ?[]  - 0 ?Emergency Hospital Admission (emergent condition) ?X- 1 10 ?Simple Discharge Coordination ?[]  - 0 ?Complex (extensive) Discharge Coordination ?PROCESS - Special Needs ?[]  - 0 ?Pediatric / Minor Patient Management ?[]  - 0 ?Isolation Patient Management ?[]  - 0 ?Hearing / Language / Visual special needs ?[]  - 0 ?Assessment of Community assistance (transportation, D/C planning, etc.) ?[]  - 0 ?Additional assistance / Altered mentation ?[]  - 0 ?Support Surface(s) Assessment  (bed, cushion, seat, etc.) ?INTERVENTIONS - Wound Cleansing / Measurement ?X - Simple Wound Cleansing - one wound 1 5 ?[]  - 0 ?Complex Wound Cleansing - multiple  wounds ?X- 1 5 ?Wound Imaging (photographs - any number of wounds) ?  - 0 ?Wound Tracing (instead of photographs) ?X- 1 5 ?Simple Wound Measurement - one wound ?  - 0 ?Complex Wound Measurement - multiple wounds ?INTERVENTIONS - Wound Dressings ?X - Small Wound Dressing one or multiple wounds 1 10 ?  - 0 ?Medium Wound Dressing one or multiple wounds ?  - 0 ?Large Wound Dressing one or multiple wounds ?  - 0 ?Application of Medications - topical ?  - 0 ?Application of Medications - injection ?INTERVENTIONS - Miscellaneous ?  - 0 ?External ear exam ?  - 0 ?Specimen Collection (cultures, biopsies, blood, body fluids, etc.) ?  - 0 ?Specimen(s) / Culture(s) sent or taken to Lab for analysis ?  - 0 ?Patient Transfer (multiple staff / Nurse, adult / Similar devices) ?  - 0 ?Simple Staple / Suture removal (25 or less) ?  - 0 ?Complex Staple / Suture removal (26 or more) ?  - 0 ?Hypo / Hyperglycemic Management (close monitor of Blood Glucose) ?  - 0 ?Ankle / Brachial Index (ABI) - do not check if billed separately ?X- 1 5 ?Vital Signs ?Has the patient been seen at the hospital within the last three years: Yes ?Total Score: 85 ?Level Of Care: New/Established - Level 3 ?Electronic Signature(s) ?Signed: 03/06/2022 4:01:08 PM By: [name] ?Entered By: Fonnie Mu on 03/06/2022 09:52:36 ?-------------------------------------------------------------------------------- ?Encounter Discharge Information Details ?Patient Name: Date of Service: ?[place] RASHIYA, LOFLAND Alvarado 9:30 A M ?Medical Record Number: [id number] ?Patient Account Number: [id number] ?Date of Birth/Sex: Treating RN: ?1966/11/25 (56 y.o. [name], Lauren ?Primary Care Jaquasia Doscher: Trena Platt Other Clinician: ?Referring Colette Dicamillo: ?Treating Nareh Matzke/Extender: Geralyn Corwin ?Allena Katz,  [name] ?Weeks in Treatment: 5 ?Encounter Discharge Information Items ?Discharge Condition: Stable ?Ambulatory Status: Ambulatory ?Discharge Destination: Home ?Transportation: Private Auto ?Accompanied By: self ?Schedule Follow-up Appointment: Yes ?Clinical Summary of Care: Patient Declined ?Electronic Signature(s) ?Signed: 03/06/2022 4:01:08 PM By: [name] ?Entered By: Fonnie Mu on 03/06/2022 09:54:21 ?-------------------------------------------------------------------------------- ?Lower Extremity Assessment Details ?Patient Name: Date of Service: ?[place] LURIE, MULLANE Alvarado 9:30 A M ?Medical Record Number: [id number] ?Patient Account Number: [id number] ?Date of Birth/Sex: Treating RN: ?January 05, 1966 (56 y.o. [name], Lauren ?Primary Care [place] Lard[facility]: Trena Platt Other Clinician: ?Referring Tiffany Talarico: ?Treating Raelee Rossmann/Extender: Geralyn Corwin ?Allena Katz, [name] ?Weeks in Treatment: 5 ?Electronic Signature(s) ?Signed: 03/06/2022 4:01:08 PM By: [name] ?Entered By: Fonnie Mu on 03/06/2022 09:20:08 ?-------------------------------------------------------------------------------- ?Multi Wound Chart Details ?Patient Name: Date of Service: ?[place] ATZIRI, ZUBIATE Alvarado 9:30 A M ?Medical Record Number: [id number] ?Patient Account Number: [id number] ?Date of Birth/Sex: ?Treating RN: ?[birth year] (56 y.o. [name], Lauren ?Primary Care Barney Gertsch: Trena Platt ?Other Clinician: ?Referring Nioma Mccubbins: ?Treating Kenroy Timberman/Extender: Geralyn Corwin ?Allena Katz, [name] ?Weeks in Treatment: 5 ?Vital Signs ?Height(in): 58 ?Capillary Blood Glucose(mg/dl): 086 ?Weight(lbs): 194 ?Pulse(bpm): 80 ?Body Mass Index(BMI): 40.5 ?Blood Pressure(mmHg): 154/79 ?Temperature(??F): 98.1 ?Respiratory Rate(breaths/min): 17 ?Photos: [N/A:N/A] ?Back N/A N/A ?Wound Location: ?Surgical Injury N/A N/A ?Wounding Event: ?Dehisced Wound N/A N/A ?Primary Etiology: ?Hypertension, Type I Diabetes, N/A N/A ?Comorbid  History: ?Neuropathy ?12/14/2021 N/A N/A ?Date Acquired: ?5 N/A N/A ?Weeks of Treatment: ?Open N/A N/A ?Wound Status: ?No N/A N/A ?Wound Recurrence: ?0x0x0 N/A N/A ?Measurements L x W x D (cm) ?0 N/A N/A ?A (cm?) : ?rea

## 2022-03-06 NOTE — Progress Notes (Signed)
NGAN, HEFLIN ([id number]) ?Visit Report for 03/06/2022 ?Chief Complaint Document Details ?Patient Name: Date of Service: ?[place] JOYLIN, DICHIARA 03/06/2022 9:30 A M ?Medical Record Number: [id number] ?Patient Account Number: [id number] ?Date of Birth/Sex: Treating RN: ?December 06, 1966 (56 y.o. Ardis Rowan, Lauren ?Primary Care Provider: Trena Platt Other Clinician: ?Referring Provider: ?Treating Provider/Extender: Geralyn Corwin ?Allena Katz, Rutwik ?Weeks in Treatment: 5 ?Information Obtained from: Patient ?Chief Complaint ?Open wound to the back secondary to surgical wound dehiscence ?Electronic Signature(s) ?Signed: 03/06/2022 9:42:59 AM By: [name] ?Entered By: Geralyn Corwin on 03/06/2022 09:39:08 ?-------------------------------------------------------------------------------- ?HPI Details ?Patient Name: Date of Service: ?[place] ALISHBA, LITZENBERG 03/06/2022 9:30 A M ?Medical Record Number: [id number] ?Patient Account Number: [id number] ?Date of Birth/Sex: Treating RN: ?January 26, 1966 (57 y.o. Ardis Rowan, Lauren ?Primary Care Provider: Trena Platt Other Clinician: ?Referring Provider: ?Treating Provider/Extender: Geralyn Corwin ?Allena Katz, Rutwik ?Weeks in Treatment: 5 ?History of Present Illness ?HPI Description: 01/26/2022 ?Ms. 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 She has a past medical history of type 2 diabetes currently controlled on an ?insulin pump, morbid obesity, spinal stenosis and OSA that presents to the clinic for a 1-65-month history of nonhealing wound to her back. She had surgery on ?12/05/2021 with Dr. Maurice Small for spondylolisthesis with lumbar radiculopathy and had an L5-S1 open TLIF and posterior lateral instrumented fusion. She ?subsequently developed a hematoma and had to have this evacuated on 12/14/2021. She developed wound dehiscence after the surgery and has an open wound ?to her back. She has not been doing any wound care to the area. She does not even keep this  covered. She currently denies signs of infection. She has some ?chronic pain to the area. ?2/24; patient presents for follow-up. She could not obtain Santyl due to cost. She started Medihoney. She has no issues or complaints today. She denies signs ?of infection. ?3/2; patient presents for follow-up. She continues to use Medihoney without issues. She denies signs of infection. ?3/16; patient presents for follow-up. She continues to use Medihoney to the wound bed. She went back to work and has had no issues doing so. ?3/28; patient presents for follow-up. She was able to obtain Santyl with a coupon for $20. She started using this to the wound bed daily. She continues to work ?and has no issues with her wound healing. She reports that the chronic pain to the wound bed has subsided. ?Electronic Signature(s) ?Signed: 03/06/2022 9:42:59 AM By: [name] ?Entered By: Geralyn Corwin on 03/06/2022 09:40:00 ?-------------------------------------------------------------------------------- ?Physical Exam Details ?Patient Name: Date of Service: ?[place] CHARDONAE, OLLER 03/06/2022 9:30 A M ?Medical Record Number: [id number] ?Patient Account Number: [id number] ?Date of Birth/Sex: Treating RN: ?05/29/1966 (56 y.o. Ardis Rowan, Lauren ?Primary Care Provider: Trena Platt Other Clinician: ?Referring Provider: ?Treating Provider/Extender: Geralyn Corwin ?Allena Katz, Rutwik ?Weeks in Treatment: 5 ?Constitutional ?respirations regular, non-labored and within target range for patient.Marland Kitchen ?Psychiatric ?pleasant and cooperative. ?Notes ?Back: T the lumbar region there is a surgical incision site that is almost completely healed. There is 1 small pinpoint opening with granulation tissue present. No ?o ?signs of surrounding infection. ?Electronic Signature(s) ?Signed: 03/06/2022 9:42:59 AM By: [name] ?Entered By: Geralyn Corwin on 03/06/2022  09:40:57 ?-------------------------------------------------------------------------------- ?Physician Orders Details ?Patient Name: Date of Service: ?[place] CUSHENA, HARTZ 03/06/2022 9:30 A M ?Medical Record Number: [id number] ?Patient Account Number: [id number] ?Date of Birth/Sex: Treating RN: ?09/13/1966 (56 y.o. Ardis Rowan, Lauren ?Primary Care Provider: Trena Platt Other Clinician: ?Referring Provider: ?Treating Provider/Extender: Geralyn Corwin ?  Allena KatzPatel, Rutwik ?Weeks in Treatment: 5 ?Verbal / Phone Orders: No ?Diagnosis Coding ?Follow-up Appointments ?ppointment in 1 week. - with Dr. Mikey BussingHoffman and Maryruth BunLauran in Room # 9 ?Return A ?Bathing/ Shower/ Hygiene ?May shower and wash wound with soap and water. - when changing dressing ?Additional Orders / Instructions ?Follow Nutritious Diet - Continue to monitor/control Blood Sugar ?Other: - [place]=Supplies ?Wound Treatment ?Wound #1 - Back ?Cleanser: Soap and Water 1 x Per Day/30 Days ?Discharge Instructions: May shower and wash wound with dial antibacterial soap and water prior to dressing change. ?Cleanser: Wound Cleanser (Generic) 1 x Per Day/30 Days ?Discharge Instructions: Anasept.Marland Kitchen.Marland Kitchen.Cleanse the wound with wound cleanser prior to applying a clean dressing using gauze sponges, not tissue or ?cotton balls. ?Peri-Wound Care: Skin Prep (Generic) 1 x Per Day/30 Days ?Discharge Instructions: Use skin prep as directed ?Topical: Triple Antibiotic Ointment, 1 (oz) Tube 1 x Per Day/30 Days ?Secondary Dressing: Woven Gauze Sponges 2x2 in (Generic) 1 x Per Day/30 Days ?Discharge Instructions: Apply over primary dressing as directed. ?Secondary Dressing: Zetuvit Plus Silicone Border Dressing 7x7(in/in) (Generic) 1 x Per Day/30 Days ?Discharge Instructions: Apply silicone border over primary dressing as directed. ?Electronic Signature(s) ?Signed: 03/06/2022 9:42:59 AM By: Geralyn CorwinHoffman, Vergie Zahm DO ?Entered By: Geralyn CorwinHoffman, Fajr Fife on 03/06/2022  09:41:16 ?-------------------------------------------------------------------------------- ?Problem List Details ?Patient Name: ?Date of Service: ?MA Angelina SheriffRTIN,  D. 03/06/2022 9:30 A M ?Medical Record Number: [id number] ?Patient Account Number: [id number] ?Date of Birth/Sex: ?Treating RN: ?11/12/1966 (56 y.o. [name]) Breedlove, Lauren ?Primary Care Provider: Trena PlattPatel, Rutwik ?Other Clinician: ?Referring Provider: ?Treating Provider/Extender: Geralyn CorwinHoffman, Reign Dziuba ?Allena KatzPatel, Rutwik ?Weeks in Treatment: 5 ?Active Problems ?ICD-10 ?Encounter ?Code Description Active Date MDM ?Diagnosis ?W09.81198.428 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 01/26/2022 No Yes ?T81.31XA Disruption of external operation (surgical) wound, not elsewhere classified, 01/26/2022 No Yes ?initial encounter ?E11.622 Type 2 diabetes mellitus with other skin ulcer 01/26/2022 No Yes ?B14.78248.062 Spinal stenosis, lumbar region with neurogenic claudication 01/26/2022 No Yes ?Inactive Problems ?Resolved Problems ?Electronic Signature(s) ?Signed: 03/06/2022 9:42:59 AM By: Geralyn CorwinHoffman, Taliya Mcclard DO ?Entered By: Geralyn CorwinHoffman, Alitza Cowman on 03/06/2022 09:38:42 ?-------------------------------------------------------------------------------- ?Progress Note Details ?Patient Name: ?Date of Service: ?MA Angelina SheriffRTIN,  D. 03/06/2022 9:30 A M ?Medical Record Number: [id number] ?Patient Account Number: [id number] ?Date of Birth/Sex: ?Treating RN: ?10/25/1966 (56 y.o. [name]) Breedlove, Lauren ?Primary Care Provider: Trena PlattPatel, Rutwik ?Other Clinician: ?Referring Provider: ?Treating Provider/Extender: Geralyn CorwinHoffman, Donnie Gedeon ?Allena KatzPatel, Rutwik ?Weeks in Treatment: 5 ?Subjective ?Chief Complaint ?Information obtained from Patient ?Open wound to the back secondary to surgical wound dehiscence ?History of Present Illness (HPI) ?01/26/2022 ?Ms. 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 She has a past medical history of type 2 diabetes currently controlled on an ?insulin pump, morbid  obesity, spinal stenosis and OSA that presents to the clinic for a 12-8934-month history of nonhealing wound to her back. She had surgery on ?12/05/2021 with Dr. Maurice Smallstergard for spondylolisthesis with lumbar radiculopathy and had an L5-S1 open TLIF and posterior lateral instrumented fusion. She ?subsequently developed a hematoma and had to have this evacuated

## 2022-03-07 ENCOUNTER — Telehealth: Payer: Self-pay

## 2022-03-07 ENCOUNTER — Other Ambulatory Visit (HOSPITAL_COMMUNITY): Payer: Self-pay

## 2022-03-07 NOTE — Telephone Encounter (Signed)
Advised pt to reach back out to dr nida to follow up on diabetes per dr patel [name] is not prescribed with trulicity with verbal understanding  ?

## 2022-03-07 NOTE — Telephone Encounter (Signed)
Patient called need refill [name] 5 mg injection, said diabetic doctor took off of insulin pumps and having synjardy bid. ? ?Pharmacy:  [facility] ?

## 2022-03-10 ENCOUNTER — Other Ambulatory Visit: Payer: Self-pay | Admitting: Internal Medicine

## 2022-03-10 DIAGNOSIS — R110 Nausea: Secondary | ICD-10-CM

## 2022-03-12 ENCOUNTER — Other Ambulatory Visit (HOSPITAL_COMMUNITY): Payer: Self-pay

## 2022-03-12 ENCOUNTER — Encounter: Payer: Self-pay | Admitting: Family Medicine

## 2022-03-12 ENCOUNTER — Telehealth: Payer: Self-pay | Admitting: "Endocrinology

## 2022-03-12 ENCOUNTER — Ambulatory Visit: Admit: 2022-03-12 | Discharge status: Absent without leave | Payer: No Typology Code available for payment source

## 2022-03-12 ENCOUNTER — Ambulatory Visit (INDEPENDENT_AMBULATORY_CARE_PROVIDER_SITE_OTHER): Payer: No Typology Code available for payment source | Admitting: Family Medicine

## 2022-03-12 VITALS — BP 118/76 | HR 82 | Ht <= 58 in | Wt 188.8 lb

## 2022-03-12 DIAGNOSIS — E1165 Type 2 diabetes mellitus with hyperglycemia: Secondary | ICD-10-CM

## 2022-03-12 DIAGNOSIS — R112 Nausea with vomiting, unspecified: Secondary | ICD-10-CM

## 2022-03-12 MED ORDER — SYNJARDY 12.5-500 MG PO TABS
1.0000 | ORAL_TABLET | Freq: Every day | ORAL | 2 refills | Status: DC
  Filled 2022-04-19: qty 60, 30d supply, fill #0

## 2022-03-12 MED ORDER — OMEPRAZOLE 20 MG PO CPDR: 20.0000 mg | DELAYED_RELEASE_CAPSULE | Freq: Every day | ORAL | 0 refills | Status: DC

## 2022-03-12 MED ORDER — ONDANSETRON HCL 4 MG PO TABS
4.0000 mg | ORAL_TABLET | Freq: Three times a day (TID) | ORAL | 0 refills | Status: DC | PRN
  Filled 2022-03-12: qty 20, 7d supply, fill #0

## 2022-03-12 NOTE — Progress Notes (Signed)
? ?Acute Office Visit ? ?Subjective:  ? ? Patient ID: [name], female    DOB: 1966/11/08, 56 y.o.   MRN: [id number] ? ?Chief Complaint  ?Patient presents with  ? Nausea  ?  Has been nauseated since Saturday.  ? ? ?[name] is a 56 y.o F seen today for complaints of nausea and vomiting that started on 03/10/2022 with three nonbilious emeses. No emeisi report since. The patient reports the onset of symptoms after starting synjardy and has since stopped taking the medication. The last time she took synjargy was on Thursday, 04/08/2022. The patient shared that she contacted the endocrinologist today, who decreased her medication frequency to 1 tablet daily after breakfast rather than twice a day.  ? ?The patient works as a Quarry manager at [employer] and states she was exposed to [employer] with a positive Covid result from a home kit that she later learned was expired.  ? ?The patient has been taking OTC Emetrol for nausea with mild relief, and reports decreased PO intake due to her current symptoms. ?The patient shares that she checks her blood glucose thrice daily; her most recent blood sugar is 132.  ? ? ? ?Past Medical History:  ?Diagnosis Date  ? Diabetes mellitus   ? insulin pump 2013  ? Dysphagia 03/09/2020  ? Hypercholesteremia   ? Hypertension   ? Incarcerated umbilical hernia 4/46/2863  ? Kidney stone   ? Kidney stones 08/31/2008  ? Qualifier: Diagnosis of  By: Jonna Munro MD, Roderic Scarce    ? Neuropathy   ? Obesity   ? Obstructive sleep apnea   ? Palpitations   ? Shortness of breath   ? ? ?Past Surgical History:  ?Procedure Laterality Date  ? BIOPSY  06/23/2020  ? Procedure: BIOPSY;  Surgeon: Daneil Dolin, MD;  [facility];  Service: Endoscopy;;  ? CARDIAC CATHETERIZATION  12/11/2012  ? normal coronary arteries  ? CESAREAN SECTION  1994;2000  ? [facility]  ? CHOLECYSTECTOMY  1992  ? COLONOSCOPY WITH PROPOFOL N/A 06/23/2020  ? large amount of stool in entire colon, prep inadequate.   ? CYSTOSCOPY W/ URETERAL STENT  PLACEMENT  05/08/2012  ? Procedure: CYSTOSCOPY WITH RETROGRADE PYELOGRAM/URETERAL STENT PLACEMENT;  Surgeon: Marissa Nestle, MD;  Location: [facility];  Service: Urology;  Laterality: Right;  ? CYSTOSCOPY WITH RETROGRADE PYELOGRAM, URETEROSCOPY AND STENT PLACEMENT Right 05/04/2020  ? Procedure: CYSTOSCOPY WITH RIGHT RETROGRADE PYELOGRAM, RIGHT URETEROSCOPY AND RIGHT URETERAL STENT EXCHANGE;  Surgeon: Cleon Gustin, MD;  Location: [facility];  Service: Urology;  Laterality: Right;  ? CYSTOSCOPY WITH RETROGRADE PYELOGRAM, URETEROSCOPY AND STENT PLACEMENT Left 08/08/2020  ? Procedure: CYSTOSCOPY WITH RETROGRADE PYELOGRAM, URETEROSCOPY WITH LASER  AND STENT PLACEMENT;  Surgeon: Cleon Gustin, MD;  Location: [facility];  Service: Urology;  Laterality: Left;  ? CYSTOSCOPY WITH RETROGRADE PYELOGRAM, URETEROSCOPY AND STENT PLACEMENT Right 11/08/2020  ? Procedure: CYSTOSCOPY WITH RIGHT RETROGRADE PYELOGRAM, RIGHT URETEROSCOPY AND STENT PLACEMENT;  Surgeon: Cleon Gustin, MD;  [facility];  Service: Urology;  Laterality: Right;  ? CYSTOSCOPY WITH RETROGRADE PYELOGRAM, URETEROSCOPY AND STENT PLACEMENT Left 12/27/2020  ? Procedure: CYSTOSCOPY WITH LEFT RETROGRADE PYELOGRAM, LEFT URETEROSCOPY AND LEFT URETERAL STENT PLACEMENT;  Surgeon: Cleon Gustin, MD;  [facility];  Service: Urology;  Laterality: Left;  ? CYSTOSCOPY WITH RETROGRADE PYELOGRAM, URETEROSCOPY AND STENT PLACEMENT Bilateral 03/16/2021  ? Procedure: CYSTOSCOPY WITH BILATERAL  RETROGRADE PYELOGRAM, BILATERAL URETEROSCOPY AND STENT PLACEMENT ON THE LEFT;  Surgeon: Cleon Gustin,  MD;  [facility]: [facility];  Service: Urology;  Laterality: Bilateral;  ? CYSTOSCOPY WITH STENT PLACEMENT Right 02/25/2020  ? Procedure: CYSTOSCOPY WITH RIGHT RETROGRADE RIGHT STENT PLACEMENT;  Surgeon: Festus Aloe, MD;  Location: [facility];  Service: Urology;  Laterality: Right;  ? ESOPHAGOGASTRODUODENOSCOPY (EGD) WITH PROPOFOL N/A 06/23/2020  ? Normal esophagus,  multiple localized erosions were found in gastric antrum s/p biopsy, normal duodenum. Empiric dilation.Reactive gastropathy with erosions, negative H.pylori, no dysplasia.    ? EXTRACORPOREAL SHOCK WAVE LITHOTRIPSY Right 10/11/2020  ? Procedure: EXTRACORPOREAL SHOCK WAVE LITHOTRIPSY (ESWL);  Surgeon: Cleon Gustin, MD;  Location: [facility];  Service: Urology;  Laterality: Right;  ? FOOT SURGERY Right March, 2013  ? [facility]-removal of bone spur  ? HOLMIUM LASER APPLICATION  3/64/6803  ? Procedure: HOLMIUM LASER LITHOTRIPSY RIGHT URETERAL CALCULUS;  Surgeon: Cleon Gustin, MD;  Location: [facility];  Service: Urology;;  ? HYSTEROSCOPY WITH THERMACHOICE  04/25/2012  ? Procedure: HYSTEROSCOPY WITH THERMACHOICE;  Surgeon: Florian Buff, MD;  Location: [facility];  Service: Gynecology;  Laterality: N/A;  total therapy time= 11 minutes 42 seconds; 34 ml D5w  in and 34 ml D5w out; temp =87 degrees F  ? LEFT HEART CATHETERIZATION WITH CORONARY ANGIOGRAM N/A 12/11/2012  ? Procedure: LEFT HEART CATHETERIZATION WITH CORONARY ANGIOGRAM;  Surgeon: Lorretta Harp, MD;  Location: [facility] CATH LAB;  Service: Cardiovascular;  Laterality: N/A;  ? LUMBAR WOUND DEBRIDEMENT N/A 12/14/2021  ? Procedure: LUMBAR HEMATOMA EVACUATION;  Surgeon: Judith Part, MD;  Location: [place];  Service: Neurosurgery;  Laterality: N/A;  ? MALONEY DILATION N/A 06/23/2020  ? Procedure: MALONEY DILATION;  Surgeon: Daneil Dolin, MD;  [facility];  Service: Endoscopy;  Laterality: N/A;  ? Sinus sergery  1988  ? [place]  ? STONE EXTRACTION WITH BASKET Right 11/08/2020  ? Procedure: STONE EXTRACTION WITH BASKET;  Surgeon: Cleon Gustin, MD;  Location: [facility];  Service: Urology;  Laterality: Right;  ? TRANSFORAMINAL LUMBAR INTERBODY FUSION (TLIF) WITH PEDICLE SCREW FIXATION 1 LEVEL N/A 12/05/2021  ? Procedure: Open Lumbar five-Sacral one Laminectomy/Transforaminal Lumbar Interbody Fusion/Posterolateral instrumented fusion;  Surgeon:  Judith Part, MD;  Location: [place];  Service: Neurosurgery;  Laterality: N/A;  3C  ? UMBILICAL HERNIA REPAIR N/A 03/25/2014  ? Procedure: UMBILICAL HERNIA REPAIR;  Surgeon: Scherry Ran, MD;  Location: [facility];  Service: General;  Laterality: N/A;  ? uterine ablation    ? ? ?Family History  ?Problem Relation Age of Onset  ? Hypertension Mother   ? Cancer Mother   ? Diabetes Mother   ? Stroke Mother   ? Diabetes Father   ? Cancer Paternal Uncle   ? Depression Paternal Grandmother   ? Depression Paternal Grandfather   ? Heart disease Other   ? Cancer Other   ? Diabetes Other   ? Achalasia Other   ? Anesthesia problems Neg Hx   ? Hypotension Neg Hx   ? Malignant hyperthermia Neg Hx   ? Pseudochol deficiency Neg Hx   ? ? ?Social History  ? ?Socioeconomic History  ? Marital status: Married  ?  Spouse name: Not on file  ? Number of children: 2  ? Years of education: college  ? Highest education level: Not on file  ?Occupational History  ? Occupation: CNA  ?  Employer: [employer]  ?  Employer: [employer]  ? Occupation: Quarry manager- at [employer]  ?Tobacco Use  ? Smoking status: Never  ?  Smokeless tobacco: Never  ?Vaping Use  ? Vaping Use: Never used  ?Substance and Sexual Activity  ? Alcohol use: No  ? Drug use: No  ? Sexual activity: Not Currently  ?  Partners: Male  ?  Birth control/protection: Post-menopausal  ?  Comment: ablation  ?Other Topics Concern  ? Not on file  ?Social History Narrative  ? Regular exercise: walks Caffeine use:   ? ?Social Determinants of Health  ? ?Financial Resource Strain: Low Risk   ? Difficulty of Paying Living Expenses: Not hard at all  ?Food Insecurity: Food Insecurity Present  ? Worried About Charity fundraiser in the Last Year: Sometimes true  ? Ran Out of Food in the Last Year: Never true  ?Transportation Needs: No Transportation Needs  ? Lack of Transportation (Medical): No  ? Lack of Transportation (Non-Medical): No  ?Physical Activity: Insufficiently Active  ?  Days of Exercise per Week: 2 days  ? Minutes of Exercise per Session: 20 min  ?Stress: Stress Concern Present  ? Feeling of Stress : To some extent  ?Social Connections: Moderately Integrated  ? Frequency of Communi

## 2022-03-12 NOTE — Assessment & Plan Note (Signed)
Continue taking Zofran for nausea ?Encouraged to increase PO intake to relieve nausea symptoms ? ? ? ?

## 2022-03-12 NOTE — Patient Instructions (Addendum)
I appreciate the opportunity to provide care for you today. ? ?Please pick up and take omeprazole 1 tab by mouth daily to help with nausea and vomiting ?Please continue to take your Zofran to help with nausea ?Please take Synjardy daily instead of two times a day ? ? ?Please call the office in 1 week  if symptoms worsen or fail to improve ? ? ?  ?It was a pleasure to see you and I look forward to continuing to work together on your health and well-being. ?Please do not hesitate to call the office if you need care or have questions about your care. ?  ?Have a wonderful day and week. ?With Gratitude, ?Alvira Monday MSN, FNP-BC  ?

## 2022-03-12 NOTE — Telephone Encounter (Signed)
Patient said she feels nauseous after she takes the Empagliflozin-metFORMIN HCl (SYNJARDY) 12.5-500 MG TABS. She said she didn't take it this morning and still feels this way. She is going to her PCP as well ?

## 2022-03-12 NOTE — Telephone Encounter (Signed)
Pt made aware

## 2022-03-13 ENCOUNTER — Telehealth: Payer: Self-pay | Admitting: *Deleted

## 2022-03-13 ENCOUNTER — Telehealth: Payer: No Typology Code available for payment source

## 2022-03-13 NOTE — Telephone Encounter (Signed)
?  Care Management  ? ?Follow Up Note ? ? ?03/13/2022 ?Name: [name] MRN: [id number] DOB: [birth year] ? ? ?Referred by: Anabel Halon, MD ?Reason for referral : Care Coordination (DM2, HTN) ? ? ?An unsuccessful telephone outreach was attempted today. The patient was referred to the case management team for assistance with care management and care coordination.  ? ?Follow Up Plan: Telephone follow up appointment with care management team member scheduled for: upon care guide rescheduling. ? ?Irving Shows RNC, BSN ?RN Case Manager ?[facility] ?[phone] ? ? ?

## 2022-03-15 ENCOUNTER — Other Ambulatory Visit (HOSPITAL_COMMUNITY): Payer: Self-pay

## 2022-03-15 ENCOUNTER — Encounter (HOSPITAL_BASED_OUTPATIENT_CLINIC_OR_DEPARTMENT_OTHER): Payer: No Typology Code available for payment source | Attending: Internal Medicine | Admitting: Internal Medicine

## 2022-03-15 DIAGNOSIS — M48062 Spinal stenosis, lumbar region with neurogenic claudication: Secondary | ICD-10-CM | POA: Diagnosis not present

## 2022-03-15 DIAGNOSIS — E11622 Type 2 diabetes mellitus with other skin ulcer: Secondary | ICD-10-CM | POA: Insufficient documentation

## 2022-03-15 DIAGNOSIS — T8131XA Disruption of external operation (surgical) wound, not elsewhere classified, initial encounter: Secondary | ICD-10-CM | POA: Insufficient documentation

## 2022-03-15 DIAGNOSIS — L98428 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: Secondary | ICD-10-CM | POA: Diagnosis present

## 2022-03-15 DIAGNOSIS — Z9641 Presence of insulin pump (external) (internal): Secondary | ICD-10-CM | POA: Diagnosis not present

## 2022-03-15 DIAGNOSIS — X58XXXA Exposure to other specified factors, initial encounter: Secondary | ICD-10-CM | POA: Insufficient documentation

## 2022-03-15 DIAGNOSIS — Z794 Long term (current) use of insulin: Secondary | ICD-10-CM | POA: Diagnosis not present

## 2022-03-20 ENCOUNTER — Institutional Professional Consult (permissible substitution): Payer: No Typology Code available for payment source | Admitting: Pulmonary Disease

## 2022-03-26 ENCOUNTER — Other Ambulatory Visit (HOSPITAL_COMMUNITY): Payer: Self-pay | Admitting: Adult Health

## 2022-03-26 DIAGNOSIS — Z1231 Encounter for screening mammogram for malignant neoplasm of breast: Secondary | ICD-10-CM

## 2022-03-27 ENCOUNTER — Telehealth: Payer: Self-pay

## 2022-03-27 NOTE — Telephone Encounter (Signed)
Looks as if [employer] sent in can you please ask her about this  ?

## 2022-03-27 NOTE — Telephone Encounter (Signed)
The medication was only to be taken daily because she had some nausea and vomiting when she started the treatments initially. She can now resume BID. Don't hesitate to contact her endocrinologist if you have any questions regarding the medication.

## 2022-03-27 NOTE — Telephone Encounter (Signed)
Scott called from [employer] about the directions for medicine  Empagliflozin-metFORMIN HCl (SYNJARDY) 12.5-500 MG TABS that was sent over to pharmacy. ? ?SIG: take 1 tablet daily, if so why was 60 tablets sent in instead of 30 tablets.   ? ?Should it be bid ?  ?Please contact Scott back at [facility] [phone]. ?

## 2022-03-27 NOTE — Telephone Encounter (Signed)
Returned call to pharmacy and informed of this.  ?

## 2022-04-02 ENCOUNTER — Other Ambulatory Visit (HOSPITAL_COMMUNITY): Payer: Self-pay

## 2022-04-02 MED ORDER — TRULICITY 1.5 MG/0.5ML ~~LOC~~ SOAJ
1.5000 mg | SUBCUTANEOUS | 2 refills | Status: DC
  Filled 2022-04-02: qty 2, 28d supply, fill #0
  Filled 2022-04-18: qty 2, 28d supply, fill #1

## 2022-04-02 NOTE — Progress Notes (Signed)
JOANNY, DUPREE ([id number]) ?Visit Report for 03/15/2022 ?Chief Complaint Document Details ?Patient Name: Date of Service: ?[place] FILIPPA, YARBOUGH 03/15/2022 10:15 A M ?Medical Record Number: [id number] ?Patient Account Number: [id number] ?Date of Birth/Sex: Treating RN: ?07-10-1966 (56 y.o. Ardis Rowan, Lauren ?Primary Care Provider: Trena Platt Other Clinician: ?Referring Provider: ?Treating Provider/Extender: Geralyn Corwin ?Allena Katz, Rutwik ?Weeks in Treatment: 6 ?Information Obtained from: Patient ?Chief Complaint ?Open wound to the back secondary to surgical wound dehiscence ?Electronic Signature(s) ?Signed: 03/15/2022 1:41:52 PM By: [name] ?Entered By: Geralyn Corwin on 03/15/2022 13:03:45 ?-------------------------------------------------------------------------------- ?HPI Details ?Patient Name: Date of Service: ?[place] ANNESHA, DELGRECO 03/15/2022 10:15 A M ?Medical Record Number: [id number] ?Patient Account Number: [id number] ?Date of Birth/Sex: Treating RN: ?February 16, 1966 (56 y.o. Ardis Rowan, Lauren ?Primary Care Provider: Trena Platt Other Clinician: ?Referring Provider: ?Treating Provider/Extender: Geralyn Corwin ?Allena Katz, Rutwik ?Weeks in Treatment: 6 ?History of Present Illness ?HPI Description: 01/26/2022 ?Ms. 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 She has a past medical history of type 2 diabetes currently controlled on an ?insulin pump, morbid obesity, spinal stenosis and OSA that presents to the clinic for a 1-53-month history of nonhealing wound to her back. She had surgery on ?12/05/2021 with Dr. Maurice Small for spondylolisthesis with lumbar radiculopathy and had an L5-S1 open TLIF and posterior lateral instrumented fusion. She ?subsequently developed a hematoma and had to have this evacuated on 12/14/2021. She developed wound dehiscence after the surgery and has an open wound ?to her back. She has not been doing any wound care to the area. She does not even keep this  covered. She currently denies signs of infection. She has some ?chronic pain to the area. ?2/24; patient presents for follow-up. She could not obtain Santyl due to cost. She started Medihoney. She has no issues or complaints today. She denies signs ?of infection. ?3/2; patient presents for follow-up. She continues to use Medihoney without issues. She denies signs of infection. ?3/16; patient presents for follow-up. She continues to use Medihoney to the wound bed. She went back to work and has had no issues doing so. ?3/28; patient presents for follow-up. She was able to obtain Santyl with a coupon for $20. She started using this to the wound bed daily. She continues to work ?and has no issues with her wound healing. She reports that the chronic pain to the wound bed has subsided. ?4/6; patient presents for follow-up. She has been using Santyl to the wound bed with benefit. She has no issues or complaints today. ?Electronic Signature(s) ?Signed: 03/15/2022 1:41:52 PM By: [name] ?Entered By: Geralyn Corwin on 03/15/2022 13:04:54 ?-------------------------------------------------------------------------------- ?Physical Exam Details ?Patient Name: Date of Service: ?[place] ARIBA, LEHNEN 03/15/2022 10:15 A M ?Medical Record Number: [id number] ?Patient Account Number: [id number] ?Date of Birth/Sex: Treating RN: ?[birth year] (56 y.o. Ardis Rowan, Lauren ?Primary Care Provider: Trena Platt Other Clinician: ?Referring Provider: ?Treating Provider/Extender: Geralyn Corwin ?Allena Katz, Rutwik ?Weeks in Treatment: 6 ?Constitutional ?respirations regular, non-labored and within target range for patient.Marland Kitchen ?Psychiatric ?pleasant and cooperative. ?Notes ?Back: Epithelization to the previous wound site at the mid lumbar region surgical site. ?Electronic Signature(s) ?Signed: 03/15/2022 1:41:52 PM By: [name] ?Entered By: Geralyn Corwin on 03/15/2022  13:06:46 ?-------------------------------------------------------------------------------- ?Physician Orders Details ?Patient Name: Date of Service: ?[place] DANAYSHA, KIRN 03/15/2022 10:15 A M ?Medical Record Number: [id number] ?Patient Account Number: [id number] ?Date of Birth/Sex: Treating RN: ?12/09/1966 (56 y.o. F) Redmond Pulling ?Primary Care Provider: Trena Platt Other Clinician: ?Referring  Provider: ?Treating Provider/Extender: Geralyn CorwinHoffman, Beza Steppe ?Allena KatzPatel, Rutwik ?Weeks in Treatment: 6 ?Verbal / Phone Orders: No ?Diagnosis Coding ?[facility] ?[facility] from [facility] - no further follow up needed ?keep wound site moist with vaseline, no dressing needed ?Electronic Signature(s) ?Signed: 03/15/2022 1:41:52 PM By: Geralyn CorwinHoffman, Danen Lapaglia DO ?Entered By: Geralyn CorwinHoffman, Paityn Balsam on 03/15/2022 13:07:14 ?-------------------------------------------------------------------------------- ?Problem List Details ?Patient Name: Date of Service: ?[name], Librada D. 03/15/2022 10:15 A M ?Medical Record Number: [id number] ?Patient Account Number: [id number] ?Date of Birth/Sex: Treating RN: ?02/21/1966 (56 y.o. [name]) Breedlove, Lauren ?Primary Care Provider: Trena PlattPatel, Rutwik Other Clinician: ?Referring Provider: ?Treating Provider/Extender: Geralyn CorwinHoffman, Aine Strycharz ?Allena KatzPatel, Rutwik ?Weeks in Treatment: 6 ?Active Problems ?ICD-10 ?Encounter ?Code Description Active Date MDM ?Diagnosis ?W09.81198.428 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 01/26/2022 No Yes ?T81.31XA Disruption of external operation (surgical) wound, not elsewhere classified, 01/26/2022 No Yes ?initial encounter ?E11.622 Type 2 diabetes mellitus with other skin ulcer 01/26/2022 No Yes ?B14.78248.062 Spinal stenosis, lumbar region with neurogenic claudication 01/26/2022 No Yes ?Inactive Problems ?Resolved Problems ?Electronic Signature(s) ?Signed: 03/15/2022 1:41:52 PM By: Geralyn CorwinHoffman, Angella Montas DO ?Entered By: Geralyn CorwinHoffman, [place] Cawood on 03/15/2022  13:03:16 ?-------------------------------------------------------------------------------- ?Progress Note Details ?Patient Name: Date of Service: ?[name], Mehak D. 03/15/2022 10:15 A M ?Medical Record Number: [id number] ?Patient Account Number: [id number] ?Date of Birth/Sex: Treating RN: ?10/04/1966 (56 y.o. [name]) Breedlove, Lauren ?Primary Care Provider: Trena PlattPatel, Rutwik Other Clinician: ?Referring Provider: ?Treating Provider/Extender: Geralyn CorwinHoffman, Arkeem Harts ?Allena KatzPatel, Rutwik ?Weeks in Treatment: 6 ?Subjective ?Chief Complaint ?Information obtained from Patient ?Open wound to the back secondary to surgical wound dehiscence ?History of Present Illness (HPI) ?01/26/2022 ?Ms. 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 She has a past medical history of type 2 diabetes currently controlled on an ?insulin pump, morbid obesity, spinal stenosis and OSA that presents to the clinic for a 1-187-month history of nonhealing wound to her back. She had surgery on ?12/05/2021 with Dr. Maurice Smallstergard for spondylolisthesis with lumbar radiculopathy and had an L5-S1 open TLIF and posterior lateral instrumented fusion. She ?subsequently developed a hematoma and had to have this evacuated on 12/14/2021. She developed wound dehiscence after the surgery and has an open wound ?to her back. She has not been doing any wound care to the area. She does not even keep this covered. She currently denies signs of infection. She has some ?chronic pain to the area. ?2/24; patient presents for follow-up. She could not obtain Santyl due to cost. She started Medihoney. She has no issues or complaints today. She denies signs ?of infection. ?3/2; patient presents for follow-up. She continues to use Medihoney without issues. She denies signs of infection. ?3/16; patient presents for follow-up. She continues to use Medihoney to the wound bed. She went back to work and has had no issues doing so. ?3/28; patient presents for follow-up. She was able to  obtain Santyl with a coupon for $20. She started using this to the wound bed daily. She continues to work ?and has no issues with her wound healing. She reports that the chronic pain to the wound bed has subsided. ?4/6; patient presents for follow-up. She has been using Santyl to the wound bed with benefit. She has no issues

## 2022-04-02 NOTE — Progress Notes (Signed)
RADONNA, BRACHER ([id number]) ?Visit Report for 03/15/2022 ?Arrival Information Details ?Patient Name: Date of Service: ?[name], [name] 03/15/2022 10:15 A M ?Medical Record Number: [id number] ?Patient Account Number: [id number] ?Date of Birth/Sex: Treating RN: ?[birth year] (56 y.o. F) Redmond Pulling ?Primary Care Dawnna Gritz: Trena Platt Other Clinician: ?Referring Jeydi Klingel: ?Treating Shikira Folino/Extender: Geralyn Corwin ?Allena Katz, Rutwik ?Weeks in Treatment: 6 ?Visit Information History Since Last Visit ?Added or deleted any medications: No ?Patient Arrived: Ambulatory ?Any new allergies or adverse reactions: No ?Arrival Time: 10:17 ?Had a fall or experienced change in No ?Transfer Assistance: None ?activities of daily living that may affect ?Patient Identification Verified: Yes ?risk of falls: ?Secondary Verification Process Completed: Yes ?Signs or symptoms of abuse/neglect since last visito No ?Patient Requires Transmission-Based Precautions: No ?Hospitalized since last visit: No ?Patient Has Alerts: No ?Implantable device outside of the clinic excluding No ?cellular tissue based products placed in the center ?since last visit: ?Has Dressing in Place as Prescribed: Yes ?Pain Present Now: No ?Electronic Signature(s) ?Signed: 03/15/2022 4:31:51 PM By: Redmond Pulling RN, BSN ?Entered By: Redmond Pulling on 03/15/2022 10:17:40 ?-------------------------------------------------------------------------------- ?Clinic Level of Care Assessment Details ?Patient Name: Date of Service: ?[name], [name] 03/15/2022 10:15 A M ?Medical Record Number: [id number] ?Patient Account Number: [id number] ?Date of Birth/Sex: Treating RN: ?Apr 06, 1966 (56 y.o. F) Redmond Pulling ?Primary Care Prentiss Hammett: Trena Platt Other Clinician: ?Referring Lalonnie Shaffer: ?Treating Porshe Fleagle/Extender: Geralyn Corwin ?Allena Katz, Rutwik ?Weeks in Treatment: 6 ?Clinic Level of Care Assessment Items ?TOOL 4 Quantity Score ?X- 1 0 ?Use when only an EandM is performed on FOLLOW-UP  visit ?ASSESSMENTS - Nursing Assessment / Reassessment ?X- 1 10 ?Reassessment of Co-morbidities (includes updates in patient status) ?  - 0 ?Reassessment of Adherence to Treatment Plan ?ASSESSMENTS - Wound and Skin A ssessment / Reassessment ?X - Simple Wound Assessment / Reassessment - one wound 1 5 ?  - 0 ?Complex Wound Assessment / Reassessment - multiple wounds ?  - 0 ?Dermatologic / Skin Assessment (not related to wound area) ?ASSESSMENTS - Focused Assessment ?  - 0 ?Circumferential Edema Measurements - multi extremities ?  - 0 ?Nutritional Assessment / Counseling / Intervention ?  - 0 ?Lower Extremity Assessment (monofilament, tuning fork, pulses) ?  - 0 ?Peripheral Arterial Disease Assessment (using hand held doppler) ?ASSESSMENTS - Ostomy and/or Continence Assessment and Care ?  - 0 ?Incontinence Assessment and Management ?  - 0 ?Ostomy Care Assessment and Management (repouching, etc.) ?PROCESS - Coordination of Care ?X - Simple Patient / Family Education for ongoing care 1 15 ?  - 0 ?Complex (extensive) Patient / Family Education for ongoing care ?X- 1 10 ?Staff obtains Consents, Records, T Results / Process Orders ?est ?  - 0 ?Staff telephones HHA, Nursing Homes / Clarify orders / etc ?  - 0 ?Routine Transfer to another Facility (non-emergent condition) ?  - 0 ?Routine Hospital Admission (non-emergent condition) ?  - 0 ?New Admissions / Manufacturing engineer / Ordering NPWT Apligraf, etc. ?, ?  - 0 ?Emergency Hospital Admission (emergent condition) ?X- 1 10 ?Simple Discharge Coordination ?  - 0 ?Complex (extensive) Discharge Coordination ?PROCESS - Special Needs ?  - 0 ?Pediatric / Minor Patient Management ?  - 0 ?Isolation Patient Management ?  - 0 ?Hearing / Language / Visual special needs ?  - 0 ?Assessment of Community assistance (transportation, D/C planning, etc.) ?  - 0 ?Additional assistance / Altered mentation ?  - 0 ?Support Surface(s) Assessment (bed, cushion, seat,  etc.) ?INTERVENTIONS - Wound Cleansing / Measurement ?  - 0 ?Simple Wound Cleansing - one wound ?  - 0 ?Complex Wound Cleansing - multiple wounds ?X- 1  5 ?Wound Imaging (photographs - any number of wounds) ?[]  - 0 ?Wound Tracing (instead of photographs) ?X- 1 5 ?Simple Wound Measurement - one wound ?[]  - 0 ?Complex Wound Measurement - multiple wounds ?INTERVENTIONS - Wound Dressings ?[]  - 0 ?Small Wound Dressing one or multiple wounds ?[]  - 0 ?Medium Wound Dressing one or multiple wounds ?[]  - 0 ?Large Wound Dressing one or multiple wounds ?[]  - 0 ?Application of Medications - topical ?[]  - 0 ?Application of Medications - injection ?INTERVENTIONS - Miscellaneous ?[]  - 0 ?External ear exam ?[]  - 0 ?Specimen Collection (cultures, biopsies, blood, body fluids, etc.) ?[]  - 0 ?Specimen(s) / Culture(s) sent or taken to Lab for analysis ?[]  - 0 ?Patient Transfer (multiple staff / Nurse, adultHoyer Lift / Similar devices) ?[]  - 0 ?Simple Staple / Suture removal (25 or less) ?[]  - 0 ?Complex Staple / Suture removal (26 or more) ?[]  - 0 ?Hypo / Hyperglycemic Management (close monitor of Blood Glucose) ?[]  - 0 ?Ankle / Brachial Index (ABI) - do not check if billed separately ?X- 1 5 ?Vital Signs ?Has the patient been seen at the hospital within the last three years: Yes ?Total Score: 65 ?Level Of Care: New/Established - Level 2 ?Electronic Signature(s) ?Signed: 03/15/2022 4:31:51 PM By: Redmond PullingPalmer, Carrie RN, BSN ?Entered By: Redmond PullingPalmer, Carrie on 03/15/2022 15:26:42 ?-------------------------------------------------------------------------------- ?Encounter Discharge Information Details ?Patient Name: Date of Service: ?[name], [name] 03/15/2022 10:15 A M ?Medical Record Number: [id number] ?Patient Account Number: [id number] ?Date of Birth/Sex: Treating RN: ?10/10/1966 (56 y.o. F) Redmond PullingPalmer, Carrie ?Primary Care Aubrei Bouchie: Trena PlattPatel, Rutwik Other Clinician: ?Referring Theo Krumholz: ?Treating Aliza Moret/Extender: Geralyn CorwinHoffman, Jessica ?Allena KatzPatel, Rutwik ?Weeks in Treatment:  6 ?Encounter Discharge Information Items ?Discharge Condition: Stable ?Ambulatory Status: Ambulatory ?Discharge Destination: Home ?Transportation: Private Auto ?Accompanied By: self ?Schedule Follow-up Appointment: No ?Clinical Summary of Care: Patient Declined ?Electronic Signature(s) ?Signed: 03/15/2022 4:31:51 PM By: Redmond PullingPalmer, Carrie RN, BSN ?Entered By: Redmond PullingPalmer, Carrie on 03/15/2022 11:04:13 ?-------------------------------------------------------------------------------- ?Lower Extremity Assessment Details ?Patient Name: Date of Service: ?[name], [name] 03/15/2022 10:15 A M ?Medical Record Number: [id number] ?Patient Account Number: [id number] ?Date of Birth/Sex: Treating RN: ?02/02/1966 (56 y.o. F) Redmond PullingPalmer, Carrie ?Primary Care Erilyn Pearman: Trena PlattPatel, Rutwik Other Clinician: ?Referring Trinity Haun: ?Treating Forrester Blando/Extender: Geralyn CorwinHoffman, Jessica ?Allena KatzPatel, Rutwik ?Weeks in Treatment: 6 ?Electronic Signature(s) ?Signed: 03/15/2022 4:31:51 PM By: Redmond PullingPalmer, Carrie RN, BSN ?Entered By: Redmond PullingPalmer, Carrie on 03/15/2022 10:18:37 ?-------------------------------------------------------------------------------- ?Multi Wound Chart Details ?Patient Name: Date of Service: ?[name], [name] 03/15/2022 10:15 A M ?Medical Record Number: [id number] ?Patient Account Number: [id number] ?Date of Birth/Sex: ?Treating RN: ?03/05/1966 (56 y.o. [name]) Breedlove, Lauren ?Primary Care Ariyana Faw: Trena PlattPatel, Rutwik ?Other Clinician: ?Referring Dontai Pember: ?Treating Florence Yeung/Extender: Geralyn CorwinHoffman, Jessica ?Allena KatzPatel, Rutwik ?Weeks in Treatment: 6 ?Vital Signs ?Height(in): 58 ?Capillary Blood Glucose(mg/dl): 086130 ?Weight(lbs): 194 ?Pulse(bpm): 70 ?Body Mass Index(BMI): 40.5 ?Blood Pressure(mmHg): 147/78 ?Temperature(??F): 97.8 ?Respiratory Rate(breaths/min): 16 ?Photos: [N/A:N/A] ?Back N/A N/A ?Wound Location: ?Surgical Injury N/A N/A ?Wounding Event: ?Dehisced Wound N/A N/A ?Primary Etiology: ?Hypertension, Type I Diabetes, N/A N/A ?Comorbid History: ?Neuropathy ?12/14/2021 N/A N/A ?Date  Acquired: ?6 N/A N/A ?Weeks of Treatment: ?Open N/A N/A ?Wound Status: ?No N/A N/A ?Wound Recurrence: ?0x0x0 N/A N/A ?Measurements L x W x D (cm) ?0 N/A N/A ?A (cm?) : ?rea ?0 N/A N/A ?Volume (cm?) : ?100.00% N/A N/A

## 2022-04-04 ENCOUNTER — Ambulatory Visit (INDEPENDENT_AMBULATORY_CARE_PROVIDER_SITE_OTHER): Payer: No Typology Code available for payment source | Admitting: Internal Medicine

## 2022-04-04 ENCOUNTER — Encounter: Payer: Self-pay | Admitting: Internal Medicine

## 2022-04-04 VITALS — BP 112/62 | HR 80 | Resp 18 | Ht <= 58 in | Wt 186.0 lb

## 2022-04-04 DIAGNOSIS — M48062 Spinal stenosis, lumbar region with neurogenic claudication: Secondary | ICD-10-CM

## 2022-04-04 DIAGNOSIS — Z23 Encounter for immunization: Secondary | ICD-10-CM

## 2022-04-04 DIAGNOSIS — E1169 Type 2 diabetes mellitus with other specified complication: Secondary | ICD-10-CM

## 2022-04-04 DIAGNOSIS — H66002 Acute suppurative otitis media without spontaneous rupture of ear drum, left ear: Secondary | ICD-10-CM

## 2022-04-04 DIAGNOSIS — Z794 Long term (current) use of insulin: Secondary | ICD-10-CM

## 2022-04-04 DIAGNOSIS — E782 Mixed hyperlipidemia: Secondary | ICD-10-CM

## 2022-04-04 DIAGNOSIS — I10 Essential (primary) hypertension: Secondary | ICD-10-CM

## 2022-04-04 MED ORDER — OFLOXACIN 0.3 % OT SOLN: 5.0000 [drp] | Freq: Every day | OTIC | 0 refills | Status: DC

## 2022-04-04 NOTE — Assessment & Plan Note (Signed)
On Zocor ?Will check lipid profile in the next visit ?

## 2022-04-04 NOTE — Patient Instructions (Signed)
Please continue taking medications as prescribed. ? ?Please continue to follow low carb diet and ambulate as tolerated. ? ?Please use Ofloxacin drops for ear infection. ?

## 2022-04-04 NOTE — Assessment & Plan Note (Signed)
BP Readings from Last 1 Encounters:  ?04/04/22 112/62  ? ?Well-controlled ?Counseled for compliance with the medications ?Advised DASH diet and moderate exercise/walking as tolerated ?

## 2022-04-04 NOTE — Assessment & Plan Note (Signed)
Started ofloxacin otic drops ?

## 2022-04-04 NOTE — Assessment & Plan Note (Signed)
Lab Results  ?Component Value Date  ? HGBA1C 7.7 (A) 03/05/2022  ? ?followed by Dr. Dorris Fetch now ?On Trulicity and Synjardy ?Advised to follow diabetic diet ?On statin and ACEi ?F/u CMP and lipid panel ?Diabetic eye exam: Advised to follow up with Ophthalmology for diabetic eye exam ?

## 2022-04-04 NOTE — Assessment & Plan Note (Signed)
Diet modification and moderate exercise as tolerated ?on GLP-1 agonist - has lost 10 lbs since the last visit ?

## 2022-04-04 NOTE — Progress Notes (Signed)
? ?Established Patient Office Visit ? ?Subjective:  ?Patient ID: [name], female    DOB: March 24, 1966  Age: 56 y.o. MRN: [id number] ? ?CC:  ?Chief Complaint  ?Patient presents with  ? Follow-up  ?  3 month follow up has been following up DM has been seeing diabetes dr   ? ? ?HPI ?[name] is a 56 y.o. female with past medical history of DM, HTN, OSA, lumbar spinal stenosis and insomnia who presents for f/u of her chronic medical conditions. ? ?HTN: BP is well-controlled. Takes medications regularly. Patient denies headache, dizziness, chest pain, dyspnea or palpitations. ? ?Type II DM: She has been taking Synjardy and Trulicity.  Her H HbA1c had increased to 7.7 in 03/22.  She states that her home blood glucose has been ranging around 130.  She has lost about 10 pounds since since the last visit with Trulicity now.  She denies any polyuria, polydipsia or polyphagia. ? ?She complains of left ear pain, which is intermittent for the last 1 week.  She denies any ear discharge, fever or chills currently. ? ?Her back pain has improved now.  She denies any discharge from the incision site now.  She has been discharged from the wound clinic now. ? ?She received first dose of Shingrix vaccine in the office today. ? ?Past Medical History:  ?Diagnosis Date  ? Diabetes mellitus   ? insulin pump 2013  ? Dysphagia 03/09/2020  ? Epidural hematoma (HCC) 12/14/2021  ? Hypercholesteremia   ? Hypertension   ? Incarcerated umbilical hernia 03/25/2014  ? Kidney stone   ? Kidney stones 08/31/2008  ? Qualifier: Diagnosis of  By: Erby Pian MD, Remi Haggard    ? Neuropathy   ? Obesity   ? Obstructive sleep apnea   ? Palpitations   ? Shortness of breath   ? ? ?Past Surgical History:  ?Procedure Laterality Date  ? BIOPSY  06/23/2020  ? Procedure: BIOPSY;  Surgeon: Corbin Ade, MD;  [facility];  Service: Endoscopy;;  ? CARDIAC CATHETERIZATION  12/11/2012  ? normal coronary arteries  ? CESAREAN SECTION  1994;2000  ? [facility]  ?  CHOLECYSTECTOMY  1992  ? COLONOSCOPY WITH PROPOFOL N/A 06/23/2020  ? large amount of stool in entire colon, prep inadequate.   ? CYSTOSCOPY W/ URETERAL STENT PLACEMENT  05/08/2012  ? Procedure: CYSTOSCOPY WITH RETROGRADE PYELOGRAM/URETERAL STENT PLACEMENT;  Surgeon: Ky Barban, MD;  Location: [facility];  Service: Urology;  Laterality: Right;  ? CYSTOSCOPY WITH RETROGRADE PYELOGRAM, URETEROSCOPY AND STENT PLACEMENT Right 05/04/2020  ? Procedure: CYSTOSCOPY WITH RIGHT RETROGRADE PYELOGRAM, RIGHT URETEROSCOPY AND RIGHT URETERAL STENT EXCHANGE;  Surgeon: Malen Gauze, MD;  Location: [facility];  Service: Urology;  Laterality: Right;  ? CYSTOSCOPY WITH RETROGRADE PYELOGRAM, URETEROSCOPY AND STENT PLACEMENT Left 08/08/2020  ? Procedure: CYSTOSCOPY WITH RETROGRADE PYELOGRAM, URETEROSCOPY WITH LASER  AND STENT PLACEMENT;  Surgeon: Malen Gauze, MD;  Location: [facility];  Service: Urology;  Laterality: Left;  ? CYSTOSCOPY WITH RETROGRADE PYELOGRAM, URETEROSCOPY AND STENT PLACEMENT Right 11/08/2020  ? Procedure: CYSTOSCOPY WITH RIGHT RETROGRADE PYELOGRAM, RIGHT URETEROSCOPY AND STENT PLACEMENT;  Surgeon: Malen Gauze, MD;  Location: [facility];  Service: Urology;  Laterality: Right;  ? CYSTOSCOPY WITH RETROGRADE PYELOGRAM, URETEROSCOPY AND STENT PLACEMENT Left 12/27/2020  ? Procedure: CYSTOSCOPY WITH LEFT RETROGRADE PYELOGRAM, LEFT URETEROSCOPY AND LEFT URETERAL STENT PLACEMENT;  Surgeon: Malen Gauze, MD;  Location: [facility];  Service: Urology;  Laterality: Left;  ? CYSTOSCOPY WITH RETROGRADE  PYELOGRAM, URETEROSCOPY AND STENT PLACEMENT Bilateral 03/16/2021  ? Procedure: CYSTOSCOPY WITH BILATERAL  RETROGRADE PYELOGRAM, BILATERAL URETEROSCOPY AND STENT PLACEMENT ON THE LEFT;  Surgeon: Malen GauzeMcKenzie, Patrick L, MD;  Location: [facility];  Service: Urology;  Laterality: Bilateral;  ? CYSTOSCOPY WITH STENT PLACEMENT Right 02/25/2020  ? Procedure: CYSTOSCOPY WITH RIGHT RETROGRADE RIGHT STENT PLACEMENT;  Surgeon: Jerilee FieldEskridge,  Matthew, MD;  Location: [facility];  Service: Urology;  Laterality: Right;  ? ESOPHAGOGASTRODUODENOSCOPY (EGD) WITH PROPOFOL N/A 06/23/2020  ? Normal esophagus, multiple localized erosions were found in gastric antrum s/p biopsy, normal duodenum. Empiric dilation.Reactive gastropathy with erosions, negative H.pylori, no dysplasia.    ? EXTRACORPOREAL SHOCK WAVE LITHOTRIPSY Right 10/11/2020  ? Procedure: EXTRACORPOREAL SHOCK WAVE LITHOTRIPSY (ESWL);  Surgeon: Malen GauzeMcKenzie, Patrick L, MD;  Location: [facility];  Service: Urology;  Laterality: Right;  ? FOOT SURGERY Right March, 2013  ? [facility]-removal of bone spur  ? HOLMIUM LASER APPLICATION  05/04/2020  ? Procedure: HOLMIUM LASER LITHOTRIPSY RIGHT URETERAL CALCULUS;  Surgeon: Malen GauzeMcKenzie, Patrick L, MD;  Location: [facility];  Service: Urology;;  ? HYSTEROSCOPY WITH THERMACHOICE  04/25/2012  ? Procedure: HYSTEROSCOPY WITH THERMACHOICE;  Surgeon: Lazaro ArmsLuther H Eure, MD;  Location: [facility];  Service: Gynecology;  Laterality: N/A;  total therapy time= 11 minutes 42 seconds; 34 ml D5w  in and 34 ml D5w out; temp =87 degrees F  ? LEFT HEART CATHETERIZATION WITH CORONARY ANGIOGRAM N/A 12/11/2012  ? Procedure: LEFT HEART CATHETERIZATION WITH CORONARY ANGIOGRAM;  Surgeon: Runell GessJonathan J Berry, MD;  [facility];  Service: Cardiovascular;  Laterality: N/A;  ? LUMBAR WOUND DEBRIDEMENT N/A 12/14/2021  ? Procedure: LUMBAR HEMATOMA EVACUATION;  Surgeon: Jadene Pierinistergard, Thomas A, MD;  Location: [facility];  Service: Neurosurgery;  Laterality: N/A;  ? MALONEY DILATION N/A 06/23/2020  ? Procedure: MALONEY DILATION;  Surgeon: Corbin Adeourk, Robert M, MD;  [facility];  Service: Endoscopy;  Laterality: N/A;  ? Sinus sergery  1988  ? [place]  ? STONE EXTRACTION WITH BASKET Right 11/08/2020  ? Procedure: STONE EXTRACTION WITH BASKET;  Surgeon: Malen GauzeMcKenzie, Patrick L, MD;  Location: [facility];  Service: Urology;  Laterality: Right;  ? TRANSFORAMINAL LUMBAR INTERBODY FUSION (TLIF) WITH PEDICLE SCREW FIXATION 1 LEVEL  N/A 12/05/2021  ? Procedure: Open Lumbar five-Sacral one Laminectomy/Transforaminal Lumbar Interbody Fusion/Posterolateral instrumented fusion;  Surgeon: Jadene Pierinistergard, Thomas A, MD;  Location: [facility];  Service: Neurosurgery;  Laterality: N/A;  3C  ? UMBILICAL HERNIA REPAIR N/A 03/25/2014  ? Procedure: UMBILICAL HERNIA REPAIR;  Surgeon: Marlane HatcherWilliam S Bradford, MD;  Location: [facility];  Service: General;  Laterality: N/A;  ? uterine ablation    ? ? ?Family History  ?Problem Relation Age of Onset  ? Hypertension Mother   ? Cancer Mother   ? Diabetes Mother   ? Stroke Mother   ? Diabetes Father   ? Cancer Paternal Uncle   ? Depression Paternal Grandmother   ? Depression Paternal Grandfather   ? Heart disease Other   ? Cancer Other   ? Diabetes Other   ? Achalasia Other   ? Anesthesia problems Neg Hx   ? Hypotension Neg Hx   ? Malignant hyperthermia Neg Hx   ? Pseudochol deficiency Neg Hx   ? ? ?Social History  ? ?Socioeconomic History  ? Marital status: Married  ?  Spouse name: Not on file  ? Number of children: 2  ? Years of education: college  ? Highest education level: Not on file  ?Occupational History  ? Occupation:  CNA  ?  Employer: [employer]  ?  Employer: [employer]  ? Occupation: Lawyer- at [employer]  ?Tobacco Use  ? Smoking status: Never  ? Smokeless tobacco: Never  ?Vaping Use  ? Vaping Use: Never used  ?Substance and Sexual Activity  ? Alcohol use: No  ? Drug use: No  ? Sexual activity: Not Currently  ?  Partners: Male  ?  Birth control/protection: Post-menopausal  ?  Comment: ablation  ?Other Topics Concern  ? Not on file  ?Social History Narrative  ? Regular exercise: walks Caffeine use:   ? ?Social Determinants of Health  ? ?Financial Resource Strain: Low Risk   ? Difficulty of Paying Living Expenses: Not hard at all  ?Food Insecurity: Food Insecurity Present  ? Worried About Programme researcher, broadcasting/film/video in the Last Year: Sometimes true  ? Ran Out of Food in the Last Year: Never true  ?Transportation Needs: No  Transportation Needs  ? Lack of Transportation (Medical): No  ? Lack of Transportation (Non-Medical): No  ?Physical Activity: Insufficiently Active  ? Days of Exercise per Week: 2 days  ? Minutes of Exercise pe

## 2022-04-04 NOTE — Assessment & Plan Note (Signed)
S/p L5-S1 lumbar body fusion, complicated by epidural hematoma - drained now ?Wound resolved now ?

## 2022-04-05 ENCOUNTER — Telehealth: Payer: Self-pay

## 2022-04-05 ENCOUNTER — Other Ambulatory Visit: Payer: Self-pay | Admitting: Internal Medicine

## 2022-04-05 DIAGNOSIS — F5101 Primary insomnia: Secondary | ICD-10-CM

## 2022-04-05 MED ORDER — HYDROXYZINE PAMOATE 25 MG PO CAPS: 25.0000 mg | ORAL_CAPSULE | Freq: Every day | ORAL | 0 refills | Status: DC

## 2022-04-05 NOTE — Telephone Encounter (Signed)
Patient called said she is still having trouble sleeping at night, asked to call and let Dr patel know. Can he call something in to help her sleep. ? ?Pharmacy:  [facility] [address] [place] ?

## 2022-04-05 NOTE — Telephone Encounter (Signed)
Pt advised with verbal understanding  °

## 2022-04-06 ENCOUNTER — Telehealth: Payer: Self-pay | Admitting: *Deleted

## 2022-04-06 NOTE — Chronic Care Management (AMB) (Signed)
?  Care Management  ? ?Note ? ?04/06/2022 ?Name: OCTAVIE ARIOLA MRN: [id number] DOB: 10/05/1966 ? ?BRIENA MALTBIE is a 56 y.o. year old female who is a primary care patient of Anabel Halon, MD and is actively engaged with the care management team. I reached out to [name] by phone today to assist with re-scheduling a follow up visit with the RN Case Manager ? ?Follow up plan: ?Unsuccessful telephone outreach attempt made. A HIPAA compliant phone message was left for the patient providing contact information and requesting a return call.  ?The care management team will reach out to the patient again over the next 7 days.  ?If patient returns call to provider office, please advise to call Embedded Care Management Care Guide Misty Stanley at [phone]. ? ?Gwenevere Ghazi  ?Care Guide, Embedded Care Coordination ?[place]  Care Management  ?Direct Dial: [phone] ? ?

## 2022-04-06 NOTE — Chronic Care Management (AMB) (Signed)
?  Care Management  ? ?Note ? ?04/06/2022 ?Name: [name] MRN: [id number] DOB: Apr 26, 1966 ? ?[name] is a 56 y.o. year old female who is a primary care patient of Lindell Spar, MD and is actively engaged with the care management team. I reached out to Evon Slack by phone today to assist with re-scheduling a follow up visit with the RN Case Manager ? ?Follow up plan: ?Telephone appointment with care management team member scheduled for:04/26/22 ? ?Laverda Sorenson  ?Care Guide, Embedded Care Coordination ?[place]  Care Management  ?Direct Dial: [phone] ? ?

## 2022-04-09 ENCOUNTER — Telehealth: Payer: Self-pay | Admitting: Urology

## 2022-04-09 DIAGNOSIS — N200 Calculus of kidney: Secondary | ICD-10-CM

## 2022-04-09 NOTE — Telephone Encounter (Signed)
Patient notified of renal ultrasound order and office visit made at patients earliest availability.  ?

## 2022-04-09 NOTE — Telephone Encounter (Signed)
Patient called the office today.  She is experiencing pain on her left side - she thinks it may be another kidney stone.  ? ?She said that she ran into Dr. Ronne Binning at the hospital and he told her to call the office to see if she can be seen with a renal ultrasound prior to her appt.  ? ?Please advise. ?

## 2022-04-12 ENCOUNTER — Other Ambulatory Visit: Payer: Self-pay | Admitting: *Deleted

## 2022-04-12 ENCOUNTER — Ambulatory Visit (HOSPITAL_COMMUNITY)
Admission: RE | Admit: 2022-04-12 | Discharge: 2022-04-12 | Disposition: A | Discharge status: Home / Self Care | Payer: No Typology Code available for payment source | Source: Ambulatory Visit | Attending: Urology | Admitting: Urology

## 2022-04-12 DIAGNOSIS — N200 Calculus of kidney: Secondary | ICD-10-CM | POA: Insufficient documentation

## 2022-04-12 DIAGNOSIS — R112 Nausea with vomiting, unspecified: Secondary | ICD-10-CM

## 2022-04-12 MED ORDER — OMEPRAZOLE 20 MG PO CPDR: 20.0000 mg | DELAYED_RELEASE_CAPSULE | Freq: Every day | ORAL | 1 refills | Status: DC

## 2022-04-16 ENCOUNTER — Ambulatory Visit (INDEPENDENT_AMBULATORY_CARE_PROVIDER_SITE_OTHER): Payer: No Typology Code available for payment source | Admitting: Urology

## 2022-04-16 ENCOUNTER — Encounter: Payer: Self-pay | Admitting: Urology

## 2022-04-16 VITALS — BP 107/71 | HR 82

## 2022-04-16 DIAGNOSIS — N200 Calculus of kidney: Secondary | ICD-10-CM | POA: Diagnosis not present

## 2022-04-16 LAB — URINALYSIS, ROUTINE W REFLEX MICROSCOPIC
Bilirubin, UA: NEGATIVE
Leukocytes,UA: NEGATIVE
Nitrite, UA: NEGATIVE
Protein,UA: NEGATIVE
RBC, UA: NEGATIVE
Specific Gravity, UA: 1.025 (ref 1.005–1.030)
Urobilinogen, Ur: 0.2 mg/dL (ref 0.2–1.0)
pH, UA: 5.5 (ref 5.0–7.5)

## 2022-04-16 MED ORDER — OXYCODONE HCL 5 MG PO TABS: 5.0000 mg | ORAL_TABLET | ORAL | 0 refills | Status: DC | PRN

## 2022-04-16 NOTE — Patient Instructions (Signed)
Ureteroscopy Ureteroscopy is a procedure to check for and treat problems inside part of the urinary tract. In this procedure, a thin, flexible tube with a light at the end (ureteroscope) is used to look at the inside of the kidneys and the ureters. The ureters are the tubes that carry urine from the kidneys to the bladder. The ureteroscope is inserted into one or both of the ureters. You may need this procedure if you have frequent urinary tract infections (UTIs), blood in your urine, or a stone in one of your ureters. A ureteroscopy can be done: To find the cause of urine blockage in a ureter and to evaluate other abnormalities inside the ureters or kidneys. To remove stones. To remove or treat growths of tissue (polyps), abnormal tissue, and some types of tumors. To remove a tissue sample and check it for disease under a microscope (biopsy). Tell a health care provider about: Any allergies you have. All medicines you are taking, including vitamins, herbs, eye drops, creams, and over-the-counter medicines. Any problems you or family members have had with anesthetic medicines. Any blood disorders you have. Any surgeries you have had. Any medical conditions you have. Whether you are pregnant or may be pregnant. What are the risks? Generally, this is a safe procedure. However, problems may occur, including: Bleeding. Infection. Allergic reactions to medicines. Scarring that narrows the ureter (stricture). Creating a hole in the ureter (perforation). What happens before the procedure? Staying hydrated Follow instructions from your health care provider about hydration, which may include: Up to 2 hours before the procedure - you may continue to drink clear liquids, such as water, clear fruit juice, black coffee, and plain tea.  Eating and drinking restrictions Follow instructions from your health care provider about eating and drinking, which may include: 8 hours before the procedure - stop  eating heavy meals or foods, such as meat, fried foods, or fatty foods. 6 hours before the procedure - stop eating light meals or foods, such as toast or cereal. 6 hours before the procedure - stop drinking milk or drinks that contain milk. 2 hours before the procedure - stop drinking clear liquids. Medicines Ask your health care provider about: Changing or stopping your regular medicines. This is especially important if you are taking diabetes medicines or blood thinners. Taking medicines such as aspirin and ibuprofen. These medicines can thin your blood. Do not take these medicines unless your health care provider tells you to take them. Taking over-the-counter medicines, vitamins, herbs, and supplements. General instructions Do not use any products that contain nicotine or tobacco for at least 4 weeks before the procedure. These products include cigarettes, e-cigarettes, and chewing tobacco. If you need help quitting, ask your health care provider. You may have a urine sample taken to check for infection. Plan to have someone take you home from the hospital or clinic. If you will be going home right after the procedure, plan to have someone with you for 24 hours. Ask your health care provider what steps will be taken to help prevent infection. These may include: Washing skin with a germ-killing soap. Receiving antibiotic medicine. What happens during the procedure?  An IV will be inserted into one of your veins. You will be given one or more of the following: A medicine to help you relax (sedative). A medicine to make you fall asleep (general anesthetic). A medicine that is injected into your spine to numb the area below and slightly above the injection site (spinal anesthetic). The   part of your body that drains urine from your bladder (urethra) will be cleaned with a germ-killing solution. The ureteroscope will be passed through your urethra into your bladder. A salt-water solution will  be sent through the ureteroscope to fill your bladder. This will help the health care provider see the openings of your ureters more clearly. The ureteroscope will be passed into your ureter. If a growth is found, a biopsy may be done. If a stone is found, it may be removed through the ureteroscope, or the stone may be broken up using a laser, shock waves, or electrical energy. In some cases, if the ureter is too small, a tube may be inserted that keeps the ureter open (ureteral stent). The stent may be left in place for 1 or 2 weeks to keep the ureter open, and then the ureteroscopy procedure will be done. The scope will be removed, and your bladder will be emptied. The procedure may vary among health care providers and hospitals. What can I expect after the procedure? After your procedure, it is common to have: Your blood pressure, heart rate, breathing rate, and blood oxygen level monitored until you leave the hospital or clinic. A burning sensation when you urinate. You may be asked to urinate. Blood in your urine. Mild discomfort in your bladder area or kidney area when urinating. A need to urinate more often or urgently. Follow these instructions at home: Medicines Take over-the-counter and prescription medicines only as told by your health care provider. If you were prescribed an antibiotic medicine, take it as told by your health care provider. Do not stop taking the antibiotic even if you start to feel better. General instructions  If you were given a sedative during the procedure, it can affect you for several hours. Do not drive or operate machinery until your health care provider says that it is safe. To relieve burning, take a warm bath or hold a warm washcloth over your groin. Drink enough fluid to keep your urine pale yellow. Drink two 8-ounce (237 mL) glasses of water every hour for the first 2 hours after you get home. Continue to drink water often at home. You can eat what  you normally do. Keep all follow-up visits as told by your health care provider. This is important. If you had a ureteral stent placed, ask your health care provider when you need to return to have it removed. Contact a health care provider if you have: Chills or a fever. Burning pain for longer than 24 hours after the procedure. Blood in your urine for longer than 24 hours after the procedure. Get help right away if you have: Large amounts of blood in your urine. Blood clots in your urine. Severe pain. Chest pain or trouble breathing. The feeling of a full bladder and you are unable to urinate. These symptoms may represent a serious problem that is an emergency. Do not wait to see if the symptoms will go away. Get medical help right away. Call your local emergency services (911 in the [place] Summary Ureteroscopy is a procedure to check for and treat problems inside part of the urinary tract. In this procedure, a thin, flexible tube with a light at the end (ureteroscope) is used to look at the inside of the kidneys and the ureters. You may need this procedure if you have frequent urinary tract infections (UTIs), blood in your urine, or a stone in a ureter. This information is not intended to replace advice given to   you by your health care provider. Make sure you discuss any questions you have with your health care provider. Document Revised: 10/26/2021 Document Reviewed: 09/02/2019 Elsevier Patient Education  2023 Elsevier Inc.  

## 2022-04-16 NOTE — Progress Notes (Signed)
? ?04/16/2022 ?3:27 PM  ? ?Evon Slack ?[birth year] ?[id number] ? ?Referring provider: Lindell Spar, MD ?[address] ?[place],  [place] [address] ? ?Left flank pain ? ? ?HPI: ?Ms Robins is a 56yo here for followup for neophrolithiasis. She has been having intermittent, sharp, moderate left flank pain formt eh past 1-2 weeks. Renal US 04/12/2022 shows a 50m left mid pole calculus. No other complaints today ? ? ?PMH: ?Past Medical History:  ?Diagnosis Date  ? Diabetes mellitus   ? insulin pump 2013  ? Dysphagia 03/09/2020  ? Epidural hematoma ([place] 12/14/2021  ? Hypercholesteremia   ? Hypertension   ? Incarcerated umbilical hernia 42/99/3716 ? Kidney stone   ? Kidney stones 08/31/2008  ? Qualifier: Diagnosis of  By: FJonna MunroMD, CRoderic Scarce   ? Neuropathy   ? Obesity   ? Obstructive sleep apnea   ? Palpitations   ? Shortness of breath   ? ? ?Surgical History: ?Past Surgical History:  ?Procedure Laterality Date  ? BIOPSY  06/23/2020  ? Procedure: BIOPSY;  Surgeon: RDaneil Dolin MD;  [facility];  Service: Endoscopy;;  ? CARDIAC CATHETERIZATION  12/11/2012  ? normal coronary arteries  ? CESAREAN SECTION  1994;2000  ? [facility]  ? CHOLECYSTECTOMY  1992  ? COLONOSCOPY WITH PROPOFOL N/A 06/23/2020  ? large amount of stool in entire colon, prep inadequate.   ? CYSTOSCOPY W/ URETERAL STENT PLACEMENT  05/08/2012  ? Procedure: CYSTOSCOPY WITH RETROGRADE PYELOGRAM/URETERAL STENT PLACEMENT;  Surgeon: MMarissa Nestle MD;  [facility];  Service: Urology;  Laterality: Right;  ? CYSTOSCOPY WITH RETROGRADE PYELOGRAM, URETEROSCOPY AND STENT PLACEMENT Right 05/04/2020  ? Procedure: CYSTOSCOPY WITH RIGHT RETROGRADE PYELOGRAM, RIGHT URETEROSCOPY AND RIGHT URETERAL STENT EXCHANGE;  Surgeon: MCleon Gustin MD;  [facility];  Service: Urology;  Laterality: Right;  ? CYSTOSCOPY WITH RETROGRADE PYELOGRAM, URETEROSCOPY AND STENT PLACEMENT Left 08/08/2020  ? Procedure: CYSTOSCOPY WITH RETROGRADE PYELOGRAM, URETEROSCOPY WITH  LASER  AND STENT PLACEMENT;  Surgeon: MCleon Gustin MD;  [facility];  Service: Urology;  Laterality: Left;  ? CYSTOSCOPY WITH RETROGRADE PYELOGRAM, URETEROSCOPY AND STENT PLACEMENT Right 11/08/2020  ? Procedure: CYSTOSCOPY WITH RIGHT RETROGRADE PYELOGRAM, RIGHT URETEROSCOPY AND STENT PLACEMENT;  Surgeon: MCleon Gustin MD;  [facility];  Service: Urology;  Laterality: Right;  ? CYSTOSCOPY WITH RETROGRADE PYELOGRAM, URETEROSCOPY AND STENT PLACEMENT Left 12/27/2020  ? Procedure: CYSTOSCOPY WITH LEFT RETROGRADE PYELOGRAM, LEFT URETEROSCOPY AND LEFT URETERAL STENT PLACEMENT;  Surgeon: MCleon Gustin MD;  [facility];  Service: Urology;  Laterality: Left;  ? CYSTOSCOPY WITH RETROGRADE PYELOGRAM, URETEROSCOPY AND STENT PLACEMENT Bilateral 03/16/2021  ? Procedure: CYSTOSCOPY WITH BILATERAL  RETROGRADE PYELOGRAM, BILATERAL URETEROSCOPY AND STENT PLACEMENT ON THE LEFT;  Surgeon: MCleon Gustin MD;  [facility];  Service: Urology;  Laterality: Bilateral;  ? CYSTOSCOPY WITH STENT PLACEMENT Right 02/25/2020  ? Procedure: CYSTOSCOPY WITH RIGHT RETROGRADE RIGHT STENT PLACEMENT;  Surgeon: EFestus Aloe MD;  [facility];  Service: Urology;  Laterality: Right;  ? ESOPHAGOGASTRODUODENOSCOPY (EGD) WITH PROPOFOL N/A 06/23/2020  ? Normal esophagus, multiple localized erosions were found in gastric antrum s/p biopsy, normal duodenum. Empiric dilation.Reactive gastropathy with erosions, negative H.pylori, no dysplasia.    ? EXTRACORPOREAL SHOCK WAVE LITHOTRIPSY Right 10/11/2020  ? Procedure: EXTRACORPOREAL SHOCK WAVE LITHOTRIPSY (ESWL);  Surgeon: MCleon Gustin MD;  [facility];  Service: Urology;  Laterality: Right;  ? FOOT SURGERY Right March, 2013  ? [facility]-removal  of bone spur  ? HOLMIUM LASER APPLICATION  5/85/2778  ? Procedure: HOLMIUM LASER LITHOTRIPSY RIGHT URETERAL CALCULUS;  Surgeon: Cleon Gustin, MD;  [facility];  Service: Urology;;  ?  HYSTEROSCOPY WITH THERMACHOICE  04/25/2012  ? Procedure: HYSTEROSCOPY WITH THERMACHOICE;  Surgeon: Florian Buff, MD;  [facility];  Service: Gynecology;  Laterality: N/A;  total therapy time= 11 minutes 42 seconds; 34 ml D5w  in and 34 ml D5w out; temp =87 degrees F  ? LEFT HEART CATHETERIZATION WITH CORONARY ANGIOGRAM N/A 12/11/2012  ? Procedure: LEFT HEART CATHETERIZATION WITH CORONARY ANGIOGRAM;  Surgeon: Lorretta Harp, MD;  Location: [facility] CATH LAB;  Service: Cardiovascular;  Laterality: N/A;  ? LUMBAR WOUND DEBRIDEMENT N/A 12/14/2021  ? Procedure: LUMBAR HEMATOMA EVACUATION;  Surgeon: Judith Part, MD;  Location: [facility];  Service: Neurosurgery;  Laterality: N/A;  ? MALONEY DILATION N/A 06/23/2020  ? Procedure: MALONEY DILATION;  Surgeon: Daneil Dolin, MD;  [facility];  Service: Endoscopy;  Laterality: N/A;  ? Sinus sergery  1988  ? [place]  ? STONE EXTRACTION WITH BASKET Right 11/08/2020  ? Procedure: STONE EXTRACTION WITH BASKET;  Surgeon: Cleon Gustin, MD;  [facility];  Service: Urology;  Laterality: Right;  ? TRANSFORAMINAL LUMBAR INTERBODY FUSION (TLIF) WITH PEDICLE SCREW FIXATION 1 LEVEL N/A 12/05/2021  ? Procedure: Open Lumbar five-Sacral one Laminectomy/Transforaminal Lumbar Interbody Fusion/Posterolateral instrumented fusion;  Surgeon: Judith Part, MD;  Location: [facility];  Service: Neurosurgery;  Laterality: N/A;  3C  ? UMBILICAL HERNIA REPAIR N/A 03/25/2014  ? Procedure: UMBILICAL HERNIA REPAIR;  Surgeon: Scherry Ran, MD;  [facility];  Service: General;  Laterality: N/A;  ? uterine ablation    ? ? ?Home Medications:  ?Allergies as of 04/16/2022   ? ?   Reactions  ? Ciprofloxacin Hives, Swelling  ? Penicillins Anaphylaxis, Rash  ? Codeine Other (See Comments)  ? had hallicunations  ? Morphine Nausea And Vomiting, Other (See Comments)  ? recieved in ED due to [facility] and had multple doses - gave visual hallucinations and vomitting.  ? Sulfonamide  Derivatives Other (See Comments)  ? REACTION: Unsure - childhood allergy  ? Vancomycin Itching  ? ?  ? ?  ?Medication List  ?  ? ?  ? Accurate as of Apr 16, 2022  3:27 PM. If you have any questions, ask your nurse or doctor.  ?  ?  ? ?  ? ?albuterol 108 (90 Base) MCG/ACT inhaler ?Commonly known as: VENTOLIN HFA ?Inhale 2 puffs into the lungs every 6 (six) hours as needed for wheezing or shortness of breath. ?  ?blood glucose meter kit and supplies ?Use up to four times daily as directed. ?  ?buPROPion 150 MG 24 hr tablet ?Commonly known as: WELLBUTRIN XL ?Take 150 mg by mouth daily. ?  ?cyclobenzaprine 10 MG tablet ?Commonly known as: FLEXERIL ?Take 1 tablet (10 mg total) by mouth 3 (three) times daily as needed for muscle spasms. ?  ?freestyle lancets ?Use to test 4 times daily ?  ?furosemide 20 MG tablet ?Commonly known as: LASIX ?Take 20 mg by mouth daily. Take 1-2 tablets daily ?  ?gabapentin 600 MG tablet ?Commonly known as: NEURONTIN ?Take 1 tablet (600 mg total) by mouth 3 (three) times daily. ?  ?glucose blood test strip ?Test up to 4 times a day ?  ?hydrOXYzine 25 MG capsule ?Commonly known as: VISTARIL ?Take 1 capsule (25 mg total) by mouth at  bedtime. ?  ?lisinopril-hydrochlorothiazide 20-12.5 MG tablet ?Commonly known as: ZESTORETIC ?Take 1 tablet by mouth daily. ?  ?ofloxacin 0.3 % OTIC solution ?Commonly known as: Floxin Otic ?Place 5 drops into the left ear daily. ?  ?omeprazole 20 MG capsule ?Commonly known as: PRILOSEC ?Take 1 capsule (20 mg total) by mouth daily. ?  ?ondansetron 4 MG tablet ?Commonly known as: Zofran ?Take 1 tablet (4 mg total) by mouth every 8 (eight) hours as needed for nausea or vomiting. ?  ?oxybutynin 5 MG 24 hr tablet ?Commonly known as: DITROPAN-XL ?Take 5 mg by mouth at bedtime. ?  ?oxyCODONE 5 MG immediate release tablet ?Commonly known as: Oxy IR/ROXICODONE ?Take 1 tablet (5 mg total) by mouth every 4 (four) hours as needed (pain). ?  ?Potassium Citrate 15 MEQ (1620 MG)  Tbcr ?Commonly known as: Urocit-K 15 ?Take 1 tablet by mouth in the morning and at bedtime. ?  ?simvastatin 40 MG tablet ?Commonly known as: ZOCOR ?Take 1 tablet (40 mg total) by mouth every evening for cholest

## 2022-04-16 NOTE — Telephone Encounter (Signed)
Opened in error

## 2022-04-18 ENCOUNTER — Other Ambulatory Visit (HOSPITAL_COMMUNITY): Payer: Self-pay

## 2022-04-18 ENCOUNTER — Encounter: Payer: Self-pay | Admitting: Gastroenterology

## 2022-04-18 ENCOUNTER — Ambulatory Visit (INDEPENDENT_AMBULATORY_CARE_PROVIDER_SITE_OTHER): Payer: No Typology Code available for payment source | Admitting: Gastroenterology

## 2022-04-18 DIAGNOSIS — Z1211 Encounter for screening for malignant neoplasm of colon: Secondary | ICD-10-CM

## 2022-04-18 NOTE — Progress Notes (Signed)
? ? ? ? ?Gastroenterology Office Note   ? ? ?Primary Care Physician:  Lindell Spar, MD  ?Primary Gastroenterologist: Dr. Gala Romney  ? ? ?Chief Complaint  ? ?Chief Complaint  ?Patient presents with  ? Follow-up  ?  One year colonoscopy  ? ? ? ?History of Present Illness  ? ?[name] is a 56 y.o. female presenting today in follow-up with a history of GERD, dysphagia s/p dilation in 2021, constipation, and poor prep on colonoscopy in 2021.  ? ?Denies any issues with constipation. Off insulin pump now. A1c is down. Has purposefully lost weight. Will take colace if needed. No GERD issues like she did since losing weight. She has changed her diet. Eating more fiber. Eating healthier options. Feels overall much improved. No longer on Linzess.  ? ? ? ? ? ?Past Medical History:  ?Diagnosis Date  ? Diabetes mellitus   ? insulin pump 2013  ? Dysphagia 03/09/2020  ? Epidural hematoma ([place]) 12/14/2021  ? Hypercholesteremia   ? Hypertension   ? Incarcerated umbilical hernia 1/51/7616  ? Kidney stone   ? Kidney stones 08/31/2008  ? Qualifier: Diagnosis of  By: Jonna Munro MD, Roderic Scarce    ? Neuropathy   ? Obesity   ? Obstructive sleep apnea   ? Palpitations   ? Shortness of breath   ? ? ?Past Surgical History:  ?Procedure Laterality Date  ? BIOPSY  06/23/2020  ? Procedure: BIOPSY;  Surgeon: Daneil Dolin, MD;  [facility];  Service: Endoscopy;;  ? CARDIAC CATHETERIZATION  12/11/2012  ? normal coronary arteries  ? CESAREAN SECTION  1994;2000  ? [facility]  ? CHOLECYSTECTOMY  1992  ? COLONOSCOPY WITH PROPOFOL N/A 06/23/2020  ? large amount of stool in entire colon, prep inadequate.   ? CYSTOSCOPY W/ URETERAL STENT PLACEMENT  05/08/2012  ? Procedure: CYSTOSCOPY WITH RETROGRADE PYELOGRAM/URETERAL STENT PLACEMENT;  Surgeon: Marissa Nestle, MD;  [facility];  Service: Urology;  Laterality: Right;  ? CYSTOSCOPY WITH RETROGRADE PYELOGRAM, URETEROSCOPY AND STENT PLACEMENT Right 05/04/2020  ? Procedure: CYSTOSCOPY WITH RIGHT  RETROGRADE PYELOGRAM, RIGHT URETEROSCOPY AND RIGHT URETERAL STENT EXCHANGE;  Surgeon: Cleon Gustin, MD;  [facility];  Service: Urology;  Laterality: Right;  ? CYSTOSCOPY WITH RETROGRADE PYELOGRAM, URETEROSCOPY AND STENT PLACEMENT Left 08/08/2020  ? Procedure: CYSTOSCOPY WITH RETROGRADE PYELOGRAM, URETEROSCOPY WITH LASER  AND STENT PLACEMENT;  Surgeon: Cleon Gustin, MD;  [facility];  Service: Urology;  Laterality: Left;  ? CYSTOSCOPY WITH RETROGRADE PYELOGRAM, URETEROSCOPY AND STENT PLACEMENT Right 11/08/2020  ? Procedure: CYSTOSCOPY WITH RIGHT RETROGRADE PYELOGRAM, RIGHT URETEROSCOPY AND STENT PLACEMENT;  Surgeon: Cleon Gustin, MD;  [facility];  Service: Urology;  Laterality: Right;  ? CYSTOSCOPY WITH RETROGRADE PYELOGRAM, URETEROSCOPY AND STENT PLACEMENT Left 12/27/2020  ? Procedure: CYSTOSCOPY WITH LEFT RETROGRADE PYELOGRAM, LEFT URETEROSCOPY AND LEFT URETERAL STENT PLACEMENT;  Surgeon: Cleon Gustin, MD;  [facility];  Service: Urology;  Laterality: Left;  ? CYSTOSCOPY WITH RETROGRADE PYELOGRAM, URETEROSCOPY AND STENT PLACEMENT Bilateral 03/16/2021  ? Procedure: CYSTOSCOPY WITH BILATERAL  RETROGRADE PYELOGRAM, BILATERAL URETEROSCOPY AND STENT PLACEMENT ON THE LEFT;  Surgeon: Cleon Gustin, MD;  [facility];  Service: Urology;  Laterality: Bilateral;  ? CYSTOSCOPY WITH STENT PLACEMENT Right 02/25/2020  ? Procedure: CYSTOSCOPY WITH RIGHT RETROGRADE RIGHT STENT PLACEMENT;  Surgeon: Festus Aloe, MD;  [facility];  Service: Urology;  Laterality: Right;  ? ESOPHAGOGASTRODUODENOSCOPY (EGD) WITH PROPOFOL N/A 06/23/2020  ? Normal esophagus,  multiple localized erosions were found in gastric antrum s/p biopsy, normal duodenum. Empiric dilation.Reactive gastropathy with erosions, negative H.pylori, no dysplasia.    ? EXTRACORPOREAL SHOCK WAVE LITHOTRIPSY Right 10/11/2020  ? Procedure: EXTRACORPOREAL SHOCK WAVE LITHOTRIPSY (ESWL);  Surgeon: Cleon Gustin,  MD;  [facility];  Service: Urology;  Laterality: Right;  ? FOOT SURGERY Right March, 2013  ? [facility]-removal of bone spur  ? HOLMIUM LASER APPLICATION  03/25/3844  ? Procedure: HOLMIUM LASER LITHOTRIPSY RIGHT URETERAL CALCULUS;  Surgeon: Cleon Gustin, MD;  [facility];  Service: Urology;;  ? HYSTEROSCOPY WITH THERMACHOICE  04/25/2012  ? Procedure: HYSTEROSCOPY WITH THERMACHOICE;  Surgeon: Florian Buff, MD;  [facility]: [facility];  Service: Gynecology;  Laterality: N/A;  total therapy time= 11 minutes 42 seconds; 34 ml D5w  in and 34 ml D5w out; temp =87 degrees F  ? LEFT HEART CATHETERIZATION WITH CORONARY ANGIOGRAM N/A 12/11/2012  ? Procedure: LEFT HEART CATHETERIZATION WITH CORONARY ANGIOGRAM;  Surgeon: Lorretta Harp, MD;  Location: [facility] CATH LAB;  Service: Cardiovascular;  Laterality: N/A;  ? LUMBAR WOUND DEBRIDEMENT N/A 12/14/2021  ? Procedure: LUMBAR HEMATOMA EVACUATION;  Surgeon: Judith Part, MD;  Location: [place];  Service: Neurosurgery;  Laterality: N/A;  ? MALONEY DILATION N/A 06/23/2020  ? Procedure: MALONEY DILATION;  Surgeon: Daneil Dolin, MD;  [facility];  Service: Endoscopy;  Laterality: N/A;  ? Sinus sergery  1988  ? [place]  ? STONE EXTRACTION WITH BASKET Right 11/08/2020  ? Procedure: STONE EXTRACTION WITH BASKET;  Surgeon: Cleon Gustin, MD;  [facility];  Service: Urology;  Laterality: Right;  ? TRANSFORAMINAL LUMBAR INTERBODY FUSION (TLIF) WITH PEDICLE SCREW FIXATION 1 LEVEL N/A 12/05/2021  ? Procedure: Open Lumbar five-Sacral one Laminectomy/Transforaminal Lumbar Interbody Fusion/Posterolateral instrumented fusion;  Surgeon: Judith Part, MD;  Location: [facility];  Service: Neurosurgery;  Laterality: N/A;  3C  ? UMBILICAL HERNIA REPAIR N/A 03/25/2014  ? Procedure: UMBILICAL HERNIA REPAIR;  Surgeon: Scherry Ran, MD;  [facility];  Service: General;  Laterality: N/A;  ? uterine ablation    ? ? ?Current Outpatient Medications   ?Medication Sig Dispense Refill  ? albuterol (VENTOLIN HFA) 108 (90 Base) MCG/ACT inhaler Inhale 2 puffs into the lungs every 6 (six) hours as needed for wheezing or shortness of breath.     ? blood glucose meter kit and supplies Use up to four times daily as directed. 1 each 0  ? buPROPion (WELLBUTRIN XL) 150 MG 24 hr tablet Take 150 mg by mouth daily.    ? cyclobenzaprine (FLEXERIL) 10 MG tablet Take 1 tablet (10 mg total) by mouth 3 (three) times daily as needed for muscle spasms. 30 tablet 0  ? Dulaglutide (TRULICITY) 1.5 XM/4.6OE SOPN Inject 1.5 mg into the skin once a week. 2 mL 2  ? Dulaglutide (TRULICITY) 1.5 HO/1.2YQ SOPN Inject 1.5 mg into the skin once a week. 2 mL 2  ? Empagliflozin-metFORMIN HCl (SYNJARDY) 12.5-500 MG TABS Take 1 tablet by mouth daily. 60 tablet 2  ? furosemide (LASIX) 20 MG tablet Take 20 mg by mouth daily. Take 1-2 tablets daily    ? gabapentin (NEURONTIN) 600 MG tablet Take 1 tablet (600 mg total) by mouth 3 (three) times daily. 90 tablet 2  ? glucose blood test strip Test up to 4 times a day 100 each 0  ? hydrOXYzine (VISTARIL) 25 MG capsule Take 1 capsule (25 mg total) by mouth at bedtime.  30 capsule 0  ? Lancets (FREESTYLE) lancets Use to test 4 times daily 100 each 0  ? lisinopril-hydrochlorothiazide (ZESTORETIC) 20-12.5 MG tablet Take 1 tablet by mouth daily. 90 tablet 3  ? ofloxacin (FLOXIN OTIC) 0.3 % OTIC solution Place 5 drops into the left ear daily. 5 mL 0  ? omeprazole (PRILOSEC) 20 MG capsule Take 1 capsule (20 mg total) by mouth daily. 90 capsule 1  ? ondansetron (ZOFRAN) 4 MG tablet Take 1 tablet (4 mg total) by mouth every 8 (eight) hours as needed for nausea or vomiting. 20 tablet 0  ? oxybutynin (DITROPAN-XL) 5 MG 24 hr tablet Take 5 mg by mouth at bedtime.    ? oxyCODONE (OXY IR/ROXICODONE) 5 MG immediate release tablet Take 1 tablet (5 mg total) by mouth every 4 (four) hours as needed (pain). 30 tablet 0  ? Potassium Citrate (UROCIT-K 15) 15 MEQ (1620 MG) TBCR  Take 1 tablet by mouth in the morning and at bedtime. 60 tablet 11  ? simvastatin (ZOCOR) 40 MG tablet Take 1 tablet (40 mg total) by mouth every evening for cholesterol 90 tablet 3  ? ?No current facility-admin

## 2022-04-18 NOTE — Patient Instructions (Signed)
We are arranging a colonoscopy in the near future with Dr. Jena Gauss! ? ?You will do an extra 1/2 day of clear liquids. ? ?5 days before the colonoscopy, I want you to start taking Linzess 145 mcg daily. ? ?Further recommendations to follow! ? ?I enjoyed seeing you again today! As you know, I value our relationship and want to provide genuine, compassionate, and quality care. I welcome your feedback. If you receive a survey regarding your visit,  I greatly appreciate you taking time to fill this out. See you next time! ? ?Gelene Mink, PhD, ANP-BC ?[facility]  ? ?

## 2022-04-19 ENCOUNTER — Other Ambulatory Visit: Payer: Self-pay | Admitting: "Endocrinology

## 2022-04-19 ENCOUNTER — Other Ambulatory Visit (HOSPITAL_COMMUNITY): Payer: Self-pay

## 2022-04-19 MED ORDER — SYNJARDY 12.5-500 MG PO TABS
1.0000 | ORAL_TABLET | Freq: Two times a day (BID) | ORAL | 2 refills | Status: DC
  Filled 2022-04-19: qty 60, 30d supply, fill #0
  Filled 2022-05-18 (×2): qty 60, 30d supply, fill #1
  Filled 2022-07-12: qty 60, 30d supply, fill #2

## 2022-04-20 ENCOUNTER — Other Ambulatory Visit (HOSPITAL_COMMUNITY): Payer: Self-pay

## 2022-04-20 ENCOUNTER — Telehealth: Payer: Self-pay

## 2022-04-20 NOTE — Telephone Encounter (Signed)
Called pt to schedule TCS ASA 3 w/Dr. Jena Gauss. She will call back after she checks her calendar. ?

## 2022-04-24 ENCOUNTER — Telehealth: Payer: Self-pay

## 2022-04-24 ENCOUNTER — Other Ambulatory Visit (HOSPITAL_COMMUNITY): Payer: Self-pay

## 2022-04-24 NOTE — Telephone Encounter (Signed)
Patient is requesting a work note to be out of work after surgery.  She would need it to cover from 05/10/22- 05/14/2022 or a later date depending on how you advise. Fax number [phone] Atttn: to Encarnacion Chu. ?

## 2022-04-25 ENCOUNTER — Other Ambulatory Visit (HOSPITAL_COMMUNITY): Payer: Self-pay

## 2022-04-26 ENCOUNTER — Ambulatory Visit: Payer: No Typology Code available for payment source | Admitting: *Deleted

## 2022-04-26 DIAGNOSIS — Z794 Long term (current) use of insulin: Secondary | ICD-10-CM

## 2022-04-26 DIAGNOSIS — I10 Essential (primary) hypertension: Secondary | ICD-10-CM

## 2022-04-26 NOTE — Chronic Care Management (AMB) (Signed)
Care Management    RN Visit Note  04/26/2022 Name: [name] MRN: [id number] DOB: 30-Jul-1966  Subjective: [name] is a 56 y.o. year old female who is a primary care patient of [name] Spar, MD. The care management team was consulted for assistance with disease management and care coordination needs.    Engaged with patient by telephone for follow up visit in response to provider referral for case management and/or care coordination services.   Consent to Services:   [name] was given information about Care Management services today including:  Care Management services includes personalized support from designated clinical staff supervised by her physician, including individualized plan of care and coordination with other care providers 24/7 contact phone numbers for assistance for urgent and routine care needs. The patient may stop case management services at any time by phone call to the office staff.  Patient agreed to services and consent obtained.   Assessment: Review of patient past medical history, allergies, medications, health status, including review of consultants reports, laboratory and other test data, was performed as part of comprehensive evaluation and provision of chronic care management services.   SDOH (Social Determinants of Health) assessments and interventions performed:    Care Plan  Allergies  Allergen Reactions   Ciprofloxacin Hives and Swelling   Penicillins Anaphylaxis and Rash   Codeine Other (See Comments)    had hallicunations   Morphine Nausea And Vomiting and Other (See Comments)    recieved in ED due to [facility] and had multple doses - gave visual hallucinations and vomitting.   Sulfonamide Derivatives Other (See Comments)    REACTION: Unsure - childhood allergy   Vancomycin Itching    Outpatient Encounter Medications as of 04/26/2022  Medication Sig Note   albuterol (VENTOLIN HFA) 108 (90 Base) MCG/ACT inhaler Inhale 2 puffs  into the lungs every 6 (six) hours as needed for wheezing or shortness of breath.  04/18/2022: As needed   blood glucose meter kit and supplies Use up to four times daily as directed.    buPROPion (WELLBUTRIN XL) 150 MG 24 hr tablet Take 150 mg by mouth daily.    cyclobenzaprine (FLEXERIL) 10 MG tablet Take 1 tablet (10 mg total) by mouth 3 (three) times daily as needed for muscle spasms.    Dulaglutide (TRULICITY) 1.5 YK/5.9DJ SOPN Inject 1.5 mg into the skin once a week.    Dulaglutide (TRULICITY) 1.5 TT/0.1XB SOPN Inject 1.5 mg into the skin once a week.    Empagliflozin-metFORMIN HCl (SYNJARDY) 12.5-500 MG TABS Take 1 tablet by mouth 2 (two) times daily.    furosemide (LASIX) 20 MG tablet Take 20 mg by mouth daily. Take 1-2 tablets daily    gabapentin (NEURONTIN) 600 MG tablet Take 1 tablet (600 mg total) by mouth 3 (three) times daily.    glucose blood test strip Test up to 4 times a day    hydrOXYzine (VISTARIL) 25 MG capsule Take 1 capsule (25 mg total) by mouth at bedtime.    Lancets (FREESTYLE) lancets Use to test 4 times daily    lisinopril-hydrochlorothiazide (ZESTORETIC) 20-12.5 MG tablet Take 1 tablet by mouth daily.    ofloxacin (FLOXIN OTIC) 0.3 % OTIC solution Place 5 drops into the left ear daily.    omeprazole (PRILOSEC) 20 MG capsule Take 1 capsule (20 mg total) by mouth daily.    ondansetron (ZOFRAN) 4 MG tablet Take 1 tablet (4 mg total) by mouth every 8 (eight) hours as needed  for nausea or vomiting.    oxybutynin (DITROPAN-XL) 5 MG 24 hr tablet Take 5 mg by mouth at bedtime.    Potassium Citrate (UROCIT-K 15) 15 MEQ (1620 MG) TBCR Take 1 tablet by mouth in the morning and at bedtime.    simvastatin (ZOCOR) 40 MG tablet Take 1 tablet (40 mg total) by mouth every evening for cholesterol    oxyCODONE (OXY IR/ROXICODONE) 5 MG immediate release tablet Take 1 tablet (5 mg total) by mouth every 4 (four) hours as needed (pain). (Patient not taking: Reported on 04/26/2022)    No  facility-administered encounter medications on file as of 04/26/2022.    Patient Active Problem List   Diagnosis Date Noted   Encounter for screening colonoscopy 04/18/2022   Non-recurrent acute suppurative otitis media of left ear without spontaneous rupture of tympanic membrane 04/04/2022   ASCUS of cervix with negative high risk HPV 01/16/2022   Encounter for gynecological examination with Papanicolaou smear of cervix 01/11/2022   Spondylolisthesis of lumbar region 12/05/2021   Spinal stenosis of lumbar region with neurogenic claudication 10/25/2021   Chronic bilateral low back pain with left-sided sciatica 10/18/2021   Constipation 01/18/2021   GERD (gastroesophageal reflux disease) 03/09/2020   Other intervertebral disc degeneration, lumbar region 05/24/2019   OSA (obstructive sleep apnea) 12/08/2014   Depression 07/15/2013   Dyspnea 11/12/2012   Headache(784.0) 10/14/2012   Neuropathy 10/14/2012   Essential hypertension, benign 05/10/2012   Diabetes mellitus ([place]) 09/29/2008   Mixed hyperlipidemia 09/29/2008   Morbid obesity due to excess calories ([place]) 08/31/2008    Conditions to be addressed/monitored: HTN and DMII  Care Plan : RN Care Manager Plan of Care  Updates made by Kassie Mends, RN since 04/26/2022 12:00 AM     Problem: No plan of care established for management of chronic disease states  (DM2, HTN, Chronic pain)   Priority: High     Long-Range Goal: Development of plan of care for chronic disease management  (DM2, HTN, Chronic pain)   Start Date: 12/26/2021  Expected End Date: 10/23/2022  Priority: High  Note:   Current Barriers:  Knowledge Deficits related to plan of care for management of HTN and DMII  Patient reports she lives with spouse and was working full time and independent in all aspects of her care until she recently had 2 back surgeries, pt reports she has walker and uses as needed mainly when leaving home. Patient reports she is now back at  work, is presently on vacation and the beach and states " I'm doing very well"   Pt reports she checks CBG TID with fasting ranges 125-130 . Pt reports she saw endocrinologist and is now off insulin pump. Pt reports she has a blood pressure cuff but does not currently check blood pressure. Pt reports incision is now completely healed from back surgery. Patient did receive advanced directives and has not yet completed  RNCM Clinical Goal(s):  Patient will verbalize understanding of plan for management of HTN and DMII as evidenced by patient report, review of EHR and  through collaboration with RN Care manager, provider, and care team.   Interventions: 1:1 collaboration with primary care provider regarding development and update of comprehensive plan of care as evidenced by provider attestation and co-signature Inter-disciplinary care team collaboration (see longitudinal plan of care) Evaluation of current treatment plan related to  self management and patient's adherence to plan as established by provider  Diabetes Interventions:  (Status:  New goal. and Goal on  track:  Yes.) Long Term Goal Assessed patient's understanding of A1c goal: <7% Reviewed medications with patient and discussed importance of medication adherence Counseled on importance of regular laboratory monitoring as prescribed Review of patient status, including review of consultants reports, relevant laboratory and other test results, and medications completed Reviewed importance of regular exercise Reviewed carbohydrate modified diet and importance of avoiding concentrated sweets and portion control Lab Results  Component Value Date   HGBA1C 5.7 (H) 11/30/2021   Hypertension Interventions:  (Status:  New goal. and Goal on track:  Yes.) Long Term Goal Last practice recorded BP readings:  BP Readings from Last 3 Encounters:  12/15/21 (!) 107/48  12/06/21 (!) 109/51  12/01/21 132/76  Most recent eGFR/CrCl:  Lab Results   Component Value Date   EGFR 104 11/30/2021    No components found for: CRCL  Reviewed medications with patient and discussed importance of compliance Advised patient, providing education and rationale, to monitor blood pressure daily and record, calling PCP for findings outside established parameters Reviewed upcoming scheduled appointments Reviewed low sodium diet Pain assessment completed  Patient Goals/Self-Care Activities: Take medications as prescribed   Attend all scheduled provider appointments Call provider office for new concerns or questions  check blood sugar at prescribed times: three times daily take the blood sugar log to all doctor visits take the blood sugar meter to all doctor visits drink 6 to 8 glasses of water each day fill half of plate with vegetables wash and dry feet carefully every day check blood pressure weekly choose a place to take my blood pressure (home, clinic or office, retail store) write blood pressure results in a log or diary take blood pressure log to all doctor appointments take medications for blood pressure exactly as prescribed eat more whole grains, fruits and vegetables, lean meats and healthy fats Complete advanced directives Follow low sodium diet- limit fast food, read labels Practice portion control Follow carbohydrate modified diet Try to do some type of regular exercise, walking is good       Plan: Telephone follow up appointment with care management team member scheduled for:  07/19/22  Jacqlyn Larsen [facility], BSN RN Case Manager [facility] [phone]

## 2022-04-26 NOTE — Patient Instructions (Addendum)
  Visit Information  Thank you for taking time to visit with me today. Please don't hesitate to contact me if I can be of assistance to you before our next scheduled telephone appointment.  Following are the goals we discussed today:  Take medications as prescribed   Attend all scheduled provider appointments Call provider office for new concerns or questions  check blood sugar at prescribed times: three times daily take the blood sugar log to all doctor visits take the blood sugar meter to all doctor visits drink 6 to 8 glasses of water each day fill half of plate with vegetables wash and dry feet carefully every day check blood pressure weekly choose a place to take my blood pressure (home, clinic or office, retail store) write blood pressure results in a log or diary take blood pressure log to all doctor appointments take medications for blood pressure exactly as prescribed eat more whole grains, fruits and vegetables, lean meats and healthy fats Complete advanced directives Follow low sodium diet- limit fast food, read labels Practice portion control Follow carbohydrate modified diet Try to do some type of regular exercise, walking is good  Our next appointment is by telephone on 07/19/22 at 9 am  Please call the care guide team at [phone] if you need to cancel or reschedule your appointment.   If you are experiencing a Mental Health or Behavioral Health Crisis or need someone to talk to, please call the Suicide and Crisis Lifeline: 988 call the [place] National Suicide Prevention Lifeline: 640-591-6673 or TTY: (610)407-4390 TTY (734)246-7761) to talk to a trained counselor call 1-800-273-TALK (toll free, 24 hour hotline) go to [facility] [address], [place] [phone]) call the [facility] Crisis Line: [phone] call 911   Patient verbalizes understanding of instructions and care plan provided today and agrees to view  in MyChart. Active MyChart status and patient understanding of how to access instructions and care plan via MyChart confirmed with patient.     Telephone follow up appointment with care management team member scheduled for:  07/19/22  Irving Shows [facility], BSN RN Case Manager [facility] [phone]

## 2022-04-30 ENCOUNTER — Other Ambulatory Visit (HOSPITAL_COMMUNITY): Payer: Self-pay

## 2022-05-02 NOTE — Telephone Encounter (Signed)
Called patient to confirm out of work dates.  She states that she no longer needs the work note her employer is aware of her surgery.

## 2022-05-03 ENCOUNTER — Encounter (HOSPITAL_COMMUNITY): Payer: No Typology Code available for payment source

## 2022-05-03 ENCOUNTER — Institutional Professional Consult (permissible substitution): Payer: No Typology Code available for payment source | Admitting: Pulmonary Disease

## 2022-05-03 ENCOUNTER — Telehealth: Payer: Self-pay

## 2022-05-03 NOTE — Telephone Encounter (Signed)
Patient called today to cancel upcoming surgery. Patient states she will call back when she is available for surgery.

## 2022-05-09 ENCOUNTER — Ambulatory Visit (INDEPENDENT_AMBULATORY_CARE_PROVIDER_SITE_OTHER): Payer: No Typology Code available for payment source | Admitting: Internal Medicine

## 2022-05-09 ENCOUNTER — Encounter: Payer: Self-pay | Admitting: Internal Medicine

## 2022-05-09 VITALS — BP 122/72 | HR 72 | Resp 18 | Ht <= 58 in | Wt 181.0 lb

## 2022-05-09 DIAGNOSIS — H66005 Acute suppurative otitis media without spontaneous rupture of ear drum, recurrent, left ear: Secondary | ICD-10-CM | POA: Diagnosis not present

## 2022-05-09 DIAGNOSIS — F5101 Primary insomnia: Secondary | ICD-10-CM | POA: Diagnosis not present

## 2022-05-09 DIAGNOSIS — N760 Acute vaginitis: Secondary | ICD-10-CM

## 2022-05-09 MED ORDER — HYDROXYZINE PAMOATE 50 MG PO CAPS: 50.0000 mg | ORAL_CAPSULE | Freq: Every day | ORAL | 0 refills | Status: DC

## 2022-05-09 MED ORDER — AZITHROMYCIN 250 MG PO TABS: ORAL_TABLET | ORAL | 0 refills | Status: AC

## 2022-05-09 MED ORDER — FLUCONAZOLE 150 MG PO TABS: 150.0000 mg | ORAL_TABLET | Freq: Once | ORAL | 0 refills | Status: AC

## 2022-05-09 NOTE — Assessment & Plan Note (Signed)
Did not improve with ofloxacin otic drops Started empiric azithromycin Referred to ENT as she has persistent ear pain

## 2022-05-09 NOTE — Patient Instructions (Addendum)
Please take Azithromycin as prescribed. Please do not use ear drops for now.  You are being referred to [facility]. [address] [place], [place] [address] [phone]

## 2022-05-09 NOTE — Assessment & Plan Note (Signed)
Increased dose of Vistaril to 50 mg nightly as needed Has been on Ativan and Xanax in the past

## 2022-05-09 NOTE — Progress Notes (Signed)
Acute Office Visit  Subjective:    Patient ID: [name], female    DOB: 10/28/1966, 56 y.o.   MRN: [id number]  Chief Complaint  Patient presents with   Otalgia    Pt has had left ear ache since [id number] tried ear drops didn't work has pain in left ear and cant hear out of it     HPI Patient is in today for complaint of left ear pain, which is intermittent.  She was given oral ofloxacin eardrops in the last visit for acute otitis media, which did not improve her symptoms.  She denies any ear discharge currently.  She has pain behind the ears as well.  Denies any chewing difficulty.  Denies any visual disturbance.  Denies any fever or chills currently.  She has chronic nasal congestion and postnasal drip.  Denies any dyspnea or wheezing currently.  She is still complains of insomnia, especially maintaining sleep.  She did not get relief with Vistaril 25 mg qHS.  Of note, she used to take Ativan and Xanax in the past.  Denies any anhedonia, SI or HI currently.  Past Medical History:  Diagnosis Date   Diabetes mellitus    insulin pump 2013   Dysphagia 03/09/2020   Epidural hematoma ([place]) 12/14/2021   Hypercholesteremia    Hypertension    Incarcerated umbilical hernia 08/23/7828   Kidney stone    Kidney stones 08/31/2008   Qualifier: Diagnosis of  By: Jonna Munro MD, Cornelius     Neuropathy    Obesity    Obstructive sleep apnea    Palpitations    Shortness of breath     Past Surgical History:  Procedure Laterality Date   BIOPSY  06/23/2020   Procedure: BIOPSY;  Surgeon: Daneil Dolin, MD;  [facility];  Service: Endoscopy;;   CARDIAC CATHETERIZATION  12/11/2012   normal coronary arteries   CESAREAN SECTION  1994;2000   [place]   COLONOSCOPY WITH PROPOFOL N/A 06/23/2020   large amount of stool in entire colon, prep inadequate.    CYSTOSCOPY W/ URETERAL STENT PLACEMENT  05/08/2012   Procedure: CYSTOSCOPY WITH RETROGRADE PYELOGRAM/URETERAL STENT  PLACEMENT;  Surgeon: Marissa Nestle, MD;  Location: [facility];  Service: Urology;  Laterality: Right;   CYSTOSCOPY WITH RETROGRADE PYELOGRAM, URETEROSCOPY AND STENT PLACEMENT Right 05/04/2020   Procedure: CYSTOSCOPY WITH RIGHT RETROGRADE PYELOGRAM, RIGHT URETEROSCOPY AND RIGHT URETERAL STENT EXCHANGE;  Surgeon: Cleon Gustin, MD;  Location: [facility];  Service: Urology;  Laterality: Right;   CYSTOSCOPY WITH RETROGRADE PYELOGRAM, URETEROSCOPY AND STENT PLACEMENT Left 08/08/2020   Procedure: CYSTOSCOPY WITH RETROGRADE PYELOGRAM, URETEROSCOPY WITH LASER  AND STENT PLACEMENT;  Surgeon: Cleon Gustin, MD;  Location: [facility];  Service: Urology;  Laterality: Left;   CYSTOSCOPY WITH RETROGRADE PYELOGRAM, URETEROSCOPY AND STENT PLACEMENT Right 11/08/2020   Procedure: CYSTOSCOPY WITH RIGHT RETROGRADE PYELOGRAM, RIGHT URETEROSCOPY AND STENT PLACEMENT;  Surgeon: Cleon Gustin, MD;  Location: [facility];  Service: Urology;  Laterality: Right;   CYSTOSCOPY WITH RETROGRADE PYELOGRAM, URETEROSCOPY AND STENT PLACEMENT Left 12/27/2020   Procedure: CYSTOSCOPY WITH LEFT RETROGRADE PYELOGRAM, LEFT URETEROSCOPY AND LEFT URETERAL STENT PLACEMENT;  Surgeon: Cleon Gustin, MD;  Location: [facility];  Service: Urology;  Laterality: Left;   CYSTOSCOPY WITH RETROGRADE PYELOGRAM, URETEROSCOPY AND STENT PLACEMENT Bilateral 03/16/2021   Procedure: CYSTOSCOPY WITH BILATERAL  RETROGRADE PYELOGRAM, BILATERAL URETEROSCOPY AND STENT PLACEMENT ON THE LEFT;  Surgeon: Cleon Gustin, MD;  Location: [facility];  Service: Urology;  Laterality: Bilateral;   CYSTOSCOPY WITH STENT PLACEMENT Right 02/25/2020   Procedure: CYSTOSCOPY WITH RIGHT RETROGRADE RIGHT STENT PLACEMENT;  Surgeon: Festus Aloe, MD;  Location: [facility];  Service: Urology;  Laterality: Right;   ESOPHAGOGASTRODUODENOSCOPY (EGD) WITH PROPOFOL N/A 06/23/2020   Normal esophagus, multiple localized erosions were found in gastric antrum s/p biopsy, normal duodenum. Empiric  dilation.Reactive gastropathy with erosions, negative H.pylori, no dysplasia.     EXTRACORPOREAL SHOCK WAVE LITHOTRIPSY Right 10/11/2020   Procedure: EXTRACORPOREAL SHOCK WAVE LITHOTRIPSY (ESWL);  Surgeon: Cleon Gustin, MD;  Location: [facility];  Service: Urology;  Laterality: Right;   FOOT SURGERY Right March, 2013   [facility]-removal of bone spur   HOLMIUM LASER APPLICATION  1/61/0960   Procedure: HOLMIUM LASER LITHOTRIPSY RIGHT URETERAL CALCULUS;  Surgeon: Cleon Gustin, MD;  Location: [facility];  Service: Urology;;   HYSTEROSCOPY WITH THERMACHOICE  04/25/2012   Procedure: HYSTEROSCOPY WITH THERMACHOICE;  Surgeon: Florian Buff, MD;  Location: [facility];  Service: Gynecology;  Laterality: N/A;  total therapy time= 11 minutes 42 seconds; 34 ml D5w  in and 34 ml D5w out; temp =87 degrees F   LEFT HEART CATHETERIZATION WITH CORONARY ANGIOGRAM N/A 12/11/2012   Procedure: LEFT HEART CATHETERIZATION WITH CORONARY ANGIOGRAM;  Surgeon: Lorretta Harp, MD;  Location: [facility];  Service: Cardiovascular;  Laterality: N/A;   LUMBAR WOUND DEBRIDEMENT N/A 12/14/2021   Procedure: LUMBAR HEMATOMA EVACUATION;  Surgeon: Judith Part, MD;  Location: [place];  Service: Neurosurgery;  Laterality: N/A;   MALONEY DILATION N/A 06/23/2020   Procedure: Venia Minks DILATION;  Surgeon: Daneil Dolin, MD;  [facility];  Service: Endoscopy;  Laterality: N/A;   Sinus sergery  1988   [place]   STONE EXTRACTION WITH BASKET Right 11/08/2020   Procedure: STONE EXTRACTION WITH BASKET;  Surgeon: Cleon Gustin, MD;  Location: [facility];  Service: Urology;  Laterality: Right;   TRANSFORAMINAL LUMBAR INTERBODY FUSION (TLIF) WITH PEDICLE SCREW FIXATION 1 LEVEL N/A 12/05/2021   Procedure: Open Lumbar five-Sacral one Laminectomy/Transforaminal Lumbar Interbody Fusion/Posterolateral instrumented fusion;  Surgeon: Judith Part, MD;  Location: [place];  Service: Neurosurgery;  Laterality: N/A;  3C    UMBILICAL HERNIA REPAIR N/A 03/25/2014   Procedure: UMBILICAL HERNIA REPAIR;  Surgeon: Scherry Ran, MD;  Location: [facility];  Service: General;  Laterality: N/A;   uterine ablation      Family History  Problem Relation Age of Onset   Hypertension Mother    Cancer Mother    Diabetes Mother    Stroke Mother    Diabetes Father    Depression Paternal Grandmother    Depression Paternal Grandfather    Cancer Paternal Uncle    Heart disease Other    Cancer Other    Diabetes Other    Achalasia Other    Anesthesia problems Neg Hx    Hypotension Neg Hx    Malignant hyperthermia Neg Hx    Pseudochol deficiency Neg Hx    Colon cancer Neg Hx    Colon polyps Neg Hx     Social History   Socioeconomic History   Marital status: Married    Spouse name: Not on file   Number of children: 2   Years of education: college   Highest education level: Not on file  Occupational History   Occupation: CNA    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: [employer]   Occupation: Quarry manager- at [employer]  Tobacco Use   Smoking status: Never   Smokeless tobacco: Never  Vaping Use   Vaping Use: Never used  Substance and Sexual Activity   Alcohol use: No   Drug use: No   Sexual activity: Not Currently    Partners: Male    Birth control/protection: Post-menopausal    Comment: ablation  Other Topics Concern   Not on file  Social History Narrative   Regular exercise: walks Caffeine use:    Social Determinants of Health   Financial Resource Strain: Low Risk    Difficulty of Paying Living Expenses: Not hard at all  Food Insecurity: Food Insecurity Present   Worried About Charity fundraiser in the Last Year: Sometimes true   Arboriculturist in the Last Year: Never true  Transportation Needs: No Transportation Needs   Lack of Transportation (Medical): No   Lack of Transportation (Non-Medical): No  Physical Activity: Insufficiently Active   Days of Exercise per Week: 2 days   Minutes of  Exercise per Session: 20 min  Stress: Stress Concern Present   Feeling of Stress : To some extent  Social Connections: Moderately Integrated   Frequency of Communication with Friends and Family: Three times a week   Frequency of Social Gatherings with Friends and Family: Twice a week   Attends Religious Services: 1 to 4 times per year   Active Member of [employer] or Organizations: No   Attends Music therapist: Never   Marital Status: Married  Human resources officer Violence: Not At Risk   Fear of Current or Ex-Partner: No   Emotionally Abused: No   Physically Abused: No   Sexually Abused: No    Outpatient Medications Prior to Visit  Medication Sig Dispense Refill   albuterol (VENTOLIN HFA) 108 (90 Base) MCG/ACT inhaler Inhale 2 puffs into the lungs every 6 (six) hours as needed for wheezing or shortness of breath.      blood glucose meter kit and supplies Use up to four times daily as directed. 1 each 0   buPROPion (WELLBUTRIN XL) 150 MG 24 hr tablet Take 150 mg by mouth daily.     cyclobenzaprine (FLEXERIL) 10 MG tablet Take 1 tablet (10 mg total) by mouth 3 (three) times daily as needed for muscle spasms. 30 tablet 0   Dulaglutide (TRULICITY) 1.5 XB/2.8UX SOPN Inject 1.5 mg into the skin once a week. 2 mL 2   Dulaglutide (TRULICITY) 1.5 LK/4.4WN SOPN Inject 1.5 mg into the skin once a week. 2 mL 2   Empagliflozin-metFORMIN HCl (SYNJARDY) 12.5-500 MG TABS Take 1 tablet by mouth 2 (two) times daily. 60 tablet 2   furosemide (LASIX) 20 MG tablet Take 20 mg by mouth daily. Take 1-2 tablets daily     gabapentin (NEURONTIN) 600 MG tablet Take 1 tablet (600 mg total) by mouth 3 (three) times daily. 90 tablet 2   glucose blood test strip Test up to 4 times a day 100 each 0   Lancets (FREESTYLE) lancets Use to test 4 times daily 100 each 0   lisinopril-hydrochlorothiazide (ZESTORETIC) 20-12.5 MG tablet Take 1 tablet by mouth daily. 90 tablet 3   omeprazole (PRILOSEC) 20 MG capsule Take 1  capsule (20 mg total) by mouth daily. 90 capsule 1   ondansetron (ZOFRAN) 4 MG tablet Take 1 tablet (4 mg total) by mouth every 8 (eight) hours as needed for nausea or vomiting. 20 tablet 0   oxybutynin (DITROPAN-XL) 5 MG 24 hr tablet Take 5  mg by mouth at bedtime.     oxyCODONE (OXY IR/ROXICODONE) 5 MG immediate release tablet Take 1 tablet (5 mg total) by mouth every 4 (four) hours as needed (pain). 30 tablet 0   Potassium Citrate (UROCIT-K 15) 15 MEQ (1620 MG) TBCR Take 1 tablet by mouth in the morning and at bedtime. 60 tablet 11   simvastatin (ZOCOR) 40 MG tablet Take 1 tablet (40 mg total) by mouth every evening for cholesterol 90 tablet 3   hydrOXYzine (VISTARIL) 25 MG capsule Take 1 capsule (25 mg total) by mouth at bedtime. 30 capsule 0   ofloxacin (FLOXIN OTIC) 0.3 % OTIC solution Place 5 drops into the left ear daily. 5 mL 0   No facility-administered medications prior to visit.    Allergies  Allergen Reactions   Ciprofloxacin Hives and Swelling   Penicillins Anaphylaxis and Rash   Codeine Other (See Comments)    had hallicunations   Morphine Nausea And Vomiting and Other (See Comments)    recieved in ED due to [facility] and had multple doses - gave visual hallucinations and vomitting.   Sulfonamide Derivatives Other (See Comments)    REACTION: Unsure - childhood allergy   Vancomycin Itching    Review of Systems  Constitutional:  Negative for chills and fever.  HENT:  Positive for ear pain (L). Negative for congestion, sinus pressure, sinus pain and sore throat.   Eyes:  Negative for pain and discharge.  Respiratory:  Negative for cough and shortness of breath.   Cardiovascular:  Negative for chest pain and palpitations.  Gastrointestinal:  Negative for abdominal pain, diarrhea, nausea and vomiting.  Endocrine: Negative for polydipsia and polyuria.  Genitourinary:  Negative for dysuria and hematuria.  Musculoskeletal:  Positive for back pain. Negative for neck pain and  neck stiffness.  Skin:  Negative for rash.  Neurological:  Negative for dizziness and weakness.  Psychiatric/Behavioral:  Negative for agitation and behavioral problems.       Objective:    Physical Exam Vitals reviewed.  Constitutional:      General: She is not in acute distress.    Appearance: She is obese. She is not diaphoretic.  HENT:     Head: Normocephalic and atraumatic.     Left Ear: A middle ear effusion is present.     Mouth/Throat:     Mouth: Mucous membranes are moist.     Pharynx: No posterior oropharyngeal erythema.  Eyes:     General: No scleral icterus.    Extraocular Movements: Extraocular movements intact.  Cardiovascular:     Rate and Rhythm: Normal rate and regular rhythm.     Pulses: Normal pulses.     Heart sounds: Normal heart sounds. No murmur heard. Pulmonary:     Breath sounds: Normal breath sounds. No wheezing or rales.  Musculoskeletal:     Cervical back: Neck supple. No tenderness.     Right lower leg: No edema.     Left lower leg: No edema.  Skin:    General: Skin is warm.     Findings: No rash.  Neurological:     General: No focal deficit present.     Mental Status: She is alert and oriented to person, place, and time.  Psychiatric:        Mood and Affect: Mood normal.        Behavior: Behavior normal.    BP 122/72 (BP Location: Right Arm, Patient Position: Sitting, Cuff Size: Normal)   Pulse 72  Resp 18   Ht 4' 8"  (1.422 m)   Wt 181 lb (82.1 kg)   LMP 07/16/2012   SpO2 96%   BMI 40.58 kg/m  Wt Readings from Last 3 Encounters:  05/09/22 181 lb (82.1 kg)  04/18/22 186 lb (84.4 kg)  04/04/22 186 lb (84.4 kg)        Assessment & Plan:   Problem List Items Addressed This Visit       Nervous and Auditory   Non-recurrent acute suppurative otitis media of left ear without spontaneous rupture of tympanic membrane - Primary    Did not improve with ofloxacin otic drops Started empiric azithromycin Referred to ENT as she has  persistent ear pain       Relevant Medications   azithromycin (ZITHROMAX) 250 MG tablet   fluconazole (DIFLUCAN) 150 MG tablet     Other   Primary insomnia    Increased dose of Vistaril to 50 mg nightly as needed Has been on Ativan and Xanax in the past       Relevant Medications   hydrOXYzine (VISTARIL) 50 MG capsule   Other Visit Diagnoses     Acute vaginitis       Relevant Medications   fluconazole (DIFLUCAN) 150 MG tablet        Meds ordered this encounter  Medications   hydrOXYzine (VISTARIL) 50 MG capsule    Sig: Take 1 capsule (50 mg total) by mouth at bedtime.    Dispense:  30 capsule    Refill:  0   azithromycin (ZITHROMAX) 250 MG tablet    Sig: Take 2 tablets on day 1, then 1 tablet daily on days 2 through 5    Dispense:  6 tablet    Refill:  0   fluconazole (DIFLUCAN) 150 MG tablet    Sig: Take 1 tablet (150 mg total) by mouth once for 1 dose.    Dispense:  1 tablet    Refill:  0     Nazli Penn Keith Rake, MD

## 2022-05-14 ENCOUNTER — Ambulatory Visit (HOSPITAL_COMMUNITY)
Admission: RE | Admit: 2022-05-14 | Discharge: 2022-05-14 | Disposition: A | Discharge status: Home / Self Care | Payer: No Typology Code available for payment source | Source: Ambulatory Visit | Attending: Adult Health | Admitting: Adult Health

## 2022-05-14 DIAGNOSIS — Z1231 Encounter for screening mammogram for malignant neoplasm of breast: Secondary | ICD-10-CM | POA: Diagnosis present

## 2022-05-15 ENCOUNTER — Other Ambulatory Visit (HOSPITAL_COMMUNITY): Payer: No Typology Code available for payment source

## 2022-05-16 ENCOUNTER — Ambulatory Visit (INDEPENDENT_AMBULATORY_CARE_PROVIDER_SITE_OTHER): Payer: No Typology Code available for payment source | Admitting: Internal Medicine

## 2022-05-16 ENCOUNTER — Ambulatory Visit: Payer: No Typology Code available for payment source | Admitting: Urology

## 2022-05-16 ENCOUNTER — Encounter: Payer: Self-pay | Admitting: Internal Medicine

## 2022-05-16 DIAGNOSIS — H6532 Chronic mucoid otitis media, left ear: Secondary | ICD-10-CM

## 2022-05-16 NOTE — Patient Instructions (Signed)
You are being referred to [facility].  If you have severe ear pain, please go to ER for evaluation of otitis media and mastoiditis.

## 2022-05-16 NOTE — Assessment & Plan Note (Addendum)
Left > right ear pain Left ear pain now chronic > 6 weeks Has been treated with ofloxacin eardrops and oral azithromycin with no relief She is allergic to ciprofloxacin, penicillin and sulfa antibiotics Urgent referral to ENT, had been referred to ENT in the last visit, but not able to see them till July If severe pain, advised to go to ER for evaluation for possible mastoiditis

## 2022-05-16 NOTE — Progress Notes (Signed)
Virtual Visit via Telephone Note   This visit type was conducted due to national recommendations for restrictions regarding the COVID-19 Pandemic (e.g. social distancing) in an effort to limit this patient's exposure and mitigate transmission in our community.  Due to her co-morbid illnesses, this patient is at least at moderate risk for complications without adequate follow up.  This format is felt to be most appropriate for this patient at this time.  The patient did not have access to video technology/had technical difficulties with video requiring transitioning to audio format only (telephone).  All issues noted in this document were discussed and addressed.  No physical exam could be performed with this format.  Evaluation Performed:  Follow-up visit  Date:  05/16/2022   ID:  [name], DOB May 22, 1966, MRN [id number]  Patient Location: Home Provider Location: Office/Clinic  Participants: Patient Location of Patient: Home Location of Provider: Telehealth Consent was obtain for visit to be over via telehealth. I verified that I am speaking with the correct person using two identifiers.  PCP:  [name] Spar, MD   Chief Complaint: B/l ear pain  History of Present Illness:    [name] is a 56 y.o. female who has a televisit for complaint of bilateral ear pain now.  She was having left ear pain for almost 6 weeks now.  She was given ofloxacin eardrops initially and later azithromycin for otitis media.  She complains of right ear pain as well now.  She denies any fever, chills, hearing problem or dizziness.  She had pain behind the ears in the last visit as well.  Denies any ear discharge currently. She denies any dyspnea or wheezing currently.  Denies any chewing difficulty.  The patient does not have symptoms concerning for COVID-19 infection (fever, chills, cough, or new shortness of breath).   Past Medical, Surgical, Social History, Allergies, and Medications have been  Reviewed.  Past Medical History:  Diagnosis Date   Diabetes mellitus    insulin pump 2013   Dysphagia 03/09/2020   Epidural hematoma ([place]) 12/14/2021   Hypercholesteremia    Hypertension    Incarcerated umbilical hernia 6/38/7564   Kidney stone    Kidney stones 08/31/2008   Qualifier: Diagnosis of  By: Jonna Munro MD, Cornelius     Neuropathy    Obesity    Obstructive sleep apnea    Palpitations    Shortness of breath    Past Surgical History:  Procedure Laterality Date   BIOPSY  06/23/2020   Procedure: BIOPSY;  Surgeon: Daneil Dolin, MD;  [facility];  Service: Endoscopy;;   CARDIAC CATHETERIZATION  12/11/2012   normal coronary arteries   CESAREAN SECTION  1994;2000   [place]   COLONOSCOPY WITH PROPOFOL N/A 06/23/2020   large amount of stool in entire colon, prep inadequate.    CYSTOSCOPY W/ URETERAL STENT PLACEMENT  05/08/2012   Procedure: CYSTOSCOPY WITH RETROGRADE PYELOGRAM/URETERAL STENT PLACEMENT;  Surgeon: Marissa Nestle, MD;  Location: [facility];  Service: Urology;  Laterality: Right;   CYSTOSCOPY WITH RETROGRADE PYELOGRAM, URETEROSCOPY AND STENT PLACEMENT Right 05/04/2020   Procedure: CYSTOSCOPY WITH RIGHT RETROGRADE PYELOGRAM, RIGHT URETEROSCOPY AND RIGHT URETERAL STENT EXCHANGE;  Surgeon: Cleon Gustin, MD;  Location: [facility];  Service: Urology;  Laterality: Right;   CYSTOSCOPY WITH RETROGRADE PYELOGRAM, URETEROSCOPY AND STENT PLACEMENT Left 08/08/2020   Procedure: CYSTOSCOPY WITH RETROGRADE PYELOGRAM, URETEROSCOPY WITH LASER  AND STENT PLACEMENT;  Surgeon: Alyson Ingles,  Candee Furbish, MD;  Location: [facility];  Service: Urology;  Laterality: Left;   CYSTOSCOPY WITH RETROGRADE PYELOGRAM, URETEROSCOPY AND STENT PLACEMENT Right 11/08/2020   Procedure: CYSTOSCOPY WITH RIGHT RETROGRADE PYELOGRAM, RIGHT URETEROSCOPY AND STENT PLACEMENT;  Surgeon: Cleon Gustin, MD;  Location: [facility];  Service: Urology;  Laterality: Right;   CYSTOSCOPY WITH  RETROGRADE PYELOGRAM, URETEROSCOPY AND STENT PLACEMENT Left 12/27/2020   Procedure: CYSTOSCOPY WITH LEFT RETROGRADE PYELOGRAM, LEFT URETEROSCOPY AND LEFT URETERAL STENT PLACEMENT;  Surgeon: Cleon Gustin, MD;  [facility];  Service: Urology;  Laterality: Left;   CYSTOSCOPY WITH RETROGRADE PYELOGRAM, URETEROSCOPY AND STENT PLACEMENT Bilateral 03/16/2021   Procedure: CYSTOSCOPY WITH BILATERAL  RETROGRADE PYELOGRAM, BILATERAL URETEROSCOPY AND STENT PLACEMENT ON THE LEFT;  Surgeon: Cleon Gustin, MD;  [facility];  Service: Urology;  Laterality: Bilateral;   CYSTOSCOPY WITH STENT PLACEMENT Right 02/25/2020   Procedure: CYSTOSCOPY WITH RIGHT RETROGRADE RIGHT STENT PLACEMENT;  Surgeon: Festus Aloe, MD;  Location: [facility];  Service: Urology;  Laterality: Right;   ESOPHAGOGASTRODUODENOSCOPY (EGD) WITH PROPOFOL N/A 06/23/2020   Normal esophagus, multiple localized erosions were found in gastric antrum s/p biopsy, normal duodenum. Empiric dilation.Reactive gastropathy with erosions, negative H.pylori, no dysplasia.     EXTRACORPOREAL SHOCK WAVE LITHOTRIPSY Right 10/11/2020   Procedure: EXTRACORPOREAL SHOCK WAVE LITHOTRIPSY (ESWL);  Surgeon: Cleon Gustin, MD;  Location: [facility];  Service: Urology;  Laterality: Right;   FOOT SURGERY Right March, 2013   [facility]-removal of bone spur   HOLMIUM LASER APPLICATION  2/54/2706   Procedure: HOLMIUM LASER LITHOTRIPSY RIGHT URETERAL CALCULUS;  Surgeon: Cleon Gustin, MD;  Location: [facility];  Service: Urology;;   HYSTEROSCOPY WITH THERMACHOICE  04/25/2012   Procedure: HYSTEROSCOPY WITH THERMACHOICE;  Surgeon: Florian Buff, MD;  Location: [facility];  Service: Gynecology;  Laterality: N/A;  total therapy time= 11 minutes 42 seconds; 34 ml D5w  in and 34 ml D5w out; temp =87 degrees F   LEFT HEART CATHETERIZATION WITH CORONARY ANGIOGRAM N/A 12/11/2012   Procedure: LEFT HEART CATHETERIZATION WITH CORONARY ANGIOGRAM;  Surgeon: Lorretta Harp, MD;  Location: [facility] C[facility];  Service: Cardiovascular;  Laterality: N/A;   LUMBAR WOUND DEBRIDEMENT N/A 12/14/2021   Procedure: LUMBAR HEMATOMA EVACUATION;  Surgeon: Judith Part, MD;  Location: [place];  Service: Neurosurgery;  Laterality: N/A;   MALONEY DILATION N/A 06/23/2020   Procedure: Venia Minks DILATION;  Surgeon: Daneil Dolin, MD;  [facility];  Service: Endoscopy;  Laterality: N/A;   Sinus sergery  1988   [place]   STONE EXTRACTION WITH BASKET Right 11/08/2020   Procedure: STONE EXTRACTION WITH BASKET;  Surgeon: Cleon Gustin, MD;  Location: [facility];  Service: Urology;  Laterality: Right;   TRANSFORAMINAL LUMBAR INTERBODY FUSION (TLIF) WITH PEDICLE SCREW FIXATION 1 LEVEL N/A 12/05/2021   Procedure: Open Lumbar five-Sacral one Laminectomy/Transforaminal Lumbar Interbody Fusion/Posterolateral instrumented fusion;  Surgeon: Judith Part, MD;  Location: [place];  Service: Neurosurgery;  Laterality: N/A;  3C   UMBILICAL HERNIA REPAIR N/A 03/25/2014   Procedure: UMBILICAL HERNIA REPAIR;  Surgeon: Scherry Ran, MD;  Location: [facility];  Service: General;  Laterality: N/A;   uterine ablation       Current Meds  Medication Sig   albuterol (VENTOLIN HFA) 108 (90 Base) MCG/ACT inhaler Inhale 2 puffs into the lungs every 6 (six) hours as needed for wheezing or shortness of breath.    blood glucose meter kit and supplies Use up  to four times daily as directed.   buPROPion (WELLBUTRIN XL) 150 MG 24 hr tablet Take 150 mg by mouth daily.   cyclobenzaprine (FLEXERIL) 10 MG tablet Take 1 tablet (10 mg total) by mouth 3 (three) times daily as needed for muscle spasms.   Dulaglutide (TRULICITY) 1.5 WC/5.8NI SOPN Inject 1.5 mg into the skin once a week.   Dulaglutide (TRULICITY) 1.5 DP/8.2UM SOPN Inject 1.5 mg into the skin once a week.   Empagliflozin-metFORMIN HCl (SYNJARDY) 12.5-500 MG TABS Take 1 tablet by mouth 2 (two) times daily.   furosemide (LASIX) 20 MG  tablet Take 20 mg by mouth daily. Take 1-2 tablets daily   gabapentin (NEURONTIN) 600 MG tablet Take 1 tablet (600 mg total) by mouth 3 (three) times daily.   glucose blood test strip Test up to 4 times a day   hydrOXYzine (VISTARIL) 50 MG capsule Take 1 capsule (50 mg total) by mouth at bedtime.   Lancets (FREESTYLE) lancets Use to test 4 times daily   lisinopril-hydrochlorothiazide (ZESTORETIC) 20-12.5 MG tablet Take 1 tablet by mouth daily.   omeprazole (PRILOSEC) 20 MG capsule Take 1 capsule (20 mg total) by mouth daily.   ondansetron (ZOFRAN) 4 MG tablet Take 1 tablet (4 mg total) by mouth every 8 (eight) hours as needed for nausea or vomiting.   oxybutynin (DITROPAN-XL) 5 MG 24 hr tablet Take 5 mg by mouth at bedtime.   oxyCODONE (OXY IR/ROXICODONE) 5 MG immediate release tablet Take 1 tablet (5 mg total) by mouth every 4 (four) hours as needed (pain).   Potassium Citrate (UROCIT-K 15) 15 MEQ (1620 MG) TBCR Take 1 tablet by mouth in the morning and at bedtime.   simvastatin (ZOCOR) 40 MG tablet Take 1 tablet (40 mg total) by mouth every evening for cholesterol     Allergies:   Ciprofloxacin, Penicillins, Codeine, Morphine, Sulfonamide derivatives, and Vancomycin   ROS:   Please see the history of present illness.     All other systems reviewed and are negative.   Labs/Other Tests and Data Reviewed:    Recent Labs: 10/10/2021: B Natriuretic Peptide 20.0 11/30/2021: ALT 19 12/13/2021: BUN 14; Creatinine, Ser 0.64; Hemoglobin 10.8; Platelets 501; Potassium 3.9; Sodium 135   Recent Lipid Panel Lab Results  Component Value Date/Time   CHOL 117 11/30/2021 02:15 PM   TRIG 84 11/30/2021 02:15 PM   HDL 47 11/30/2021 02:15 PM   CHOLHDL 4.0 07/20/2013 11:00 AM   LDLCALC 53 11/30/2021 02:15 PM    Wt Readings from Last 3 Encounters:  05/09/22 181 lb (82.1 kg)  04/18/22 186 lb (84.4 kg)  04/04/22 186 lb (84.4 kg)     ASSESSMENT & PLAN:    Chronic mucoid otitis media of left  ear Left > right ear pain Left ear pain now chronic > 6 weeks Has been treated with ofloxacin eardrops and oral azithromycin with no relief She is allergic to ciprofloxacin, penicillin and sulfa antibiotics Urgent referral to ENT, had been referred to ENT in the last visit, but not able to see them till July If severe pain, advised to go to ER for evaluation for possible mastoiditis   Time:   Today, I have spent 12 minutes reviewing the chart, including problem list, medications, and with the patient with telehealth technology discussing the above problems.   Medication Adjustments/Labs and Tests Ordered: Current medicines are reviewed at length with the patient today.  Concerns regarding medicines are outlined above.   Tests Ordered: Orders Placed This  Encounter  Procedures   Ambulatory referral to ENT    Medication Changes: No orders of the defined types were placed in this encounter.    Note: This dictation was prepared with Dragon dictation along with smaller phrase technology. Similar sounding words can be transcribed inadequately or may not be corrected upon review. Any transcriptional errors that result from this process are unintentional.      Disposition:  Follow up  Signed, [name] Spar, MD  05/16/2022 5:20 PM     [facility]

## 2022-05-17 ENCOUNTER — Emergency Department (HOSPITAL_COMMUNITY): Payer: No Typology Code available for payment source

## 2022-05-17 ENCOUNTER — Encounter (HOSPITAL_COMMUNITY): Payer: Self-pay | Admitting: Emergency Medicine

## 2022-05-17 ENCOUNTER — Telehealth: Payer: Self-pay

## 2022-05-17 ENCOUNTER — Emergency Department (HOSPITAL_COMMUNITY)
Admission: EM | Admit: 2022-05-17 | Discharge: 2022-05-17 | Disposition: A | Discharge status: Home / Self Care | Payer: No Typology Code available for payment source | Attending: Emergency Medicine | Admitting: Emergency Medicine

## 2022-05-17 ENCOUNTER — Other Ambulatory Visit: Payer: Self-pay

## 2022-05-17 ENCOUNTER — Ambulatory Visit: Admit: 2022-05-17 | Payer: No Typology Code available for payment source | Admitting: Urology

## 2022-05-17 DIAGNOSIS — Z87442 Personal history of urinary calculi: Secondary | ICD-10-CM | POA: Insufficient documentation

## 2022-05-17 DIAGNOSIS — R109 Unspecified abdominal pain: Secondary | ICD-10-CM | POA: Insufficient documentation

## 2022-05-17 DIAGNOSIS — R110 Nausea: Secondary | ICD-10-CM | POA: Diagnosis not present

## 2022-05-17 LAB — CBC
HCT: 43.6 % (ref 36.0–46.0)
Hemoglobin: 14.1 g/dL (ref 12.0–15.0)
MCH: 27.4 pg (ref 26.0–34.0)
MCHC: 32.3 g/dL (ref 30.0–36.0)
MCV: 84.8 fL (ref 80.0–100.0)
Platelets: 302 10*3/uL (ref 150–400)
RBC: 5.14 MIL/uL — ABNORMAL HIGH (ref 3.87–5.11)
RDW: 13.6 % (ref 11.5–15.5)
WBC: 7.4 10*3/uL (ref 4.0–10.5)
nRBC: 0 % (ref 0.0–0.2)

## 2022-05-17 LAB — BASIC METABOLIC PANEL
Anion gap: 9 (ref 5–15)
BUN: 11 mg/dL (ref 6–20)
CO2: 28 mmol/L (ref 22–32)
Calcium: 9 mg/dL (ref 8.9–10.3)
Chloride: 100 mmol/L (ref 98–111)
Creatinine, Ser: 0.71 mg/dL (ref 0.44–1.00)
GFR, Estimated: >60 mL/min (ref >60)
Glucose, Bld: 131 mg/dL — ABNORMAL HIGH (ref 70–99)
Potassium: 3.7 mmol/L (ref 3.5–5.1)
Sodium: 137 mmol/L (ref 135–145)

## 2022-05-17 LAB — URINALYSIS, ROUTINE W REFLEX MICROSCOPIC
Bilirubin Urine: NEGATIVE
Glucose, UA: >=500 mg/dL — AB
Hgb urine dipstick: NEGATIVE
Ketones, ur: NEGATIVE mg/dL
Leukocytes,Ua: NEGATIVE
Nitrite: NEGATIVE
Protein, ur: NEGATIVE mg/dL
Specific Gravity, Urine: 1.017 (ref 1.005–1.030)
pH: 5 (ref 5.0–8.0)

## 2022-05-17 SURGERY — CYSTOURETEROSCOPY, WITH RETROGRADE PYELOGRAM AND STENT INSERTION
Anesthesia: General | Laterality: Left

## 2022-05-17 MED ORDER — KETOROLAC TROMETHAMINE 15 MG/ML IJ SOLN
15.0000 mg | Freq: Once | INTRAMUSCULAR | Status: AC
  Administered 2022-05-17: 15 mg via INTRAVENOUS
  Filled 2022-05-17: qty 1

## 2022-05-17 NOTE — Telephone Encounter (Signed)
ER provider attempted to page McKenzie, spoke with ER nurse and she advised no obstruction. Patient not going home with pai meds, patient being discharged now. She advised she would like to proceed with surgery as soon as possible.

## 2022-05-17 NOTE — Telephone Encounter (Signed)
Please see note.

## 2022-05-17 NOTE — ED Triage Notes (Signed)
Pt to the ED with complaints of Left side flank pain that radiates to her front groin area. Patient has a history of kidney states and states she last urinated at 1800 yesterday.  Pt states she has seen a small amount of blood in her urine.

## 2022-05-17 NOTE — Telephone Encounter (Signed)
Patient called to see what her next steps are after going to the emergency room.  I informed her she will have to be added on Dr. Ruel Favors next available surgery date which is in July.  Patient voiced understanding and wants to proceed with surgery, she is also asking for a sooner sx date if there are any cancellations.

## 2022-05-17 NOTE — ED Notes (Signed)
Patient had 394mL of urine in the bladder pre-void and patient provided urine sample with approximately <6270mL of urine in bladder post-void. Dr. Rubin PayorPickering made aware and verbalized results.

## 2022-05-17 NOTE — ED Provider Notes (Signed)
[facility] EMERGENCY DEPARTMENT Provider Note   CSN: [id number] Arrival date & time: 05/17/22  0601     History  Chief Complaint  Patient presents with   Flank Pain    LATINA FRANK is a 56 y.o. female.   Flank Pain  Patient presents with left flank pain.  History of same.  Reported difficulty urinating.  Was due to have an intervention by Dr. Alyson Ingles but it was canceled.  No fevers.  Has had some mild nausea.  Typical pain that she has had previously with the stones.     Home Medications Prior to Admission medications   Medication Sig Start Date End Date Taking? Authorizing Provider  albuterol (VENTOLIN HFA) 108 (90 Base) MCG/ACT inhaler Inhale 2 puffs into the lungs every 6 (six) hours as needed for wheezing or shortness of breath.  08/27/19  Yes [provider]  buPROPion (WELLBUTRIN XL) 150 MG 24 hr tablet Take 150 mg by mouth daily.   Yes [provider]  cyclobenzaprine (FLEXERIL) 10 MG tablet Take 1 tablet (10 mg total) by mouth 3 (three) times daily as needed for muscle spasms. 12/15/21  Yes Ostergard, Joyice Faster, MD  Dulaglutide (TRULICITY) 1.5 SA/6.3KZ SOPN Inject 1.5 mg into the skin once a week. 03/05/22  Yes Nida, Marella Chimes, MD  Empagliflozin-metFORMIN HCl (SYNJARDY) 12.5-500 MG TABS Take 1 tablet by mouth 2 (two) times daily. 04/19/22  Yes Nida, Marella Chimes, MD  furosemide (LASIX) 20 MG tablet Take 20 mg by mouth daily. Take 1-2 tablets daily   Yes [provider]  gabapentin (NEURONTIN) 600 MG tablet Take 1 tablet (600 mg total) by mouth 3 (three) times daily. 12/30/21  Yes   hydrOXYzine (VISTARIL) 50 MG capsule Take 1 capsule (50 mg total) by mouth at bedtime. 05/09/22  Yes Lindell Spar, MD  lisinopril-hydrochlorothiazide (ZESTORETIC) 20-12.5 MG tablet Take 1 tablet by mouth daily. 02/08/22  Yes Lindell Spar, MD  omeprazole (PRILOSEC) 20 MG capsule Take 1 capsule (20 mg total) by mouth daily. 04/12/22  Yes Lindell Spar, MD   oxybutynin (DITROPAN-XL) 5 MG 24 hr tablet Take 5 mg by mouth at bedtime.   Yes [provider]  oxyCODONE (OXY IR/ROXICODONE) 5 MG immediate release tablet Take 1 tablet (5 mg total) by mouth every 4 (four) hours as needed (pain). 04/16/22  Yes McKenzie, Candee Furbish, MD  Potassium Citrate (UROCIT-K 15) 15 MEQ (1620 MG) TBCR Take 1 tablet by mouth in the morning and at bedtime. 01/05/22  Yes McKenzie, Candee Furbish, MD  simvastatin (ZOCOR) 40 MG tablet Take 1 tablet (40 mg total) by mouth every evening for cholesterol 02/08/22  Yes Lindell Spar, MD  blood glucose meter kit and supplies Use up to four times daily as directed. 08/31/21   [name], NP  Dulaglutide (TRULICITY) 1.5 SW/1.0XN SOPN Inject 1.5 mg into the skin once a week. Patient not taking: Reported on 05/17/2022 03/05/22   Cassandria Anger, MD  glucose blood test strip Test up to 4 times a day 10/02/21   Lindell Spar, MD  Lancets (FREESTYLE) lancets Use to test 4 times daily 08/31/21   [name], NP  ondansetron (ZOFRAN) 4 MG tablet Take 1 tablet (4 mg total) by mouth every 8 (eight) hours as needed for nausea or vomiting. Patient not taking: Reported on 05/17/2022 03/12/22   Lindell Spar, MD      Allergies    Ciprofloxacin, Penicillins, Codeine, Morphine, Sulfonamide derivatives,  and Vancomycin    Review of Systems   Review of Systems  Genitourinary:  Positive for flank pain.    Physical Exam Updated Vital Signs BP 114/73   Pulse 71   Temp 98.5 F (36.9 C) (Oral)   Resp 12   Ht 4' 8" (1.422 m)   Wt 82.1 kg   LMP 07/16/2012   SpO2 98%   BMI 40.58 kg/m  Physical Exam Vitals and nursing note reviewed.  Abdominal:     Tenderness: There is no abdominal tenderness.  Genitourinary:    Comments: No CVA tenderness on the left. Musculoskeletal:     Cervical back: Neck supple.  Neurological:     Mental Status: She is alert and oriented to person, place, and time.     ED Results / Procedures / Treatments    Labs (all labs ordered are listed, but only abnormal results are displayed) Labs Reviewed  URINALYSIS, ROUTINE W REFLEX MICROSCOPIC - Abnormal; Notable for the following components:      Result Value   Glucose, UA >=500 (*)    Bacteria, UA RARE (*)    All other components within normal limits  BASIC METABOLIC PANEL - Abnormal; Notable for the following components:   Glucose, Bld 131 (*)    All other components within normal limits  CBC - Abnormal; Notable for the following components:   RBC 5.14 (*)    All other components within normal limits  URINE CULTURE    EKG None  Radiology US Renal  Result Date: 05/17/2022 CLINICAL DATA:  Left flank pain EXAM: RENAL / URINARY TRACT ULTRASOUND COMPLETE COMPARISON:  04/12/2022 FINDINGS: Right Kidney: Renal measurements: 11.3 x 5.5 x 7.1 cm = volume: 231 mL. Echogenicity within normal limits. No mass or hydronephrosis visualized. Shadowing calculus in the inferior pole. Left Kidney: Renal measurements: 11.7 x 5.8 x 5.0 cm = volume: 178 mL. Echogenicity within normal limits. No mass or hydronephrosis visualized. Shadowing calculi in the superior pole. Bladder: Appears normal for degree of bladder distention. Other: None. IMPRESSION: 1. No acute abnormality. No hydronephrosis. 2. Bilateral nonobstructive nephrolithiasis. Electronically Signed   By: Delanna Ahmadi M.D.   On: 05/17/2022 09:23   DG Abdomen 1 View  Result Date: 05/17/2022 CLINICAL DATA:  Left-sided abdominal and flank pain. History of kidney stones. EXAM: ABDOMEN - 1 VIEW COMPARISON:  Ultrasound 04/12/2022. CT 02/01/2021 FINDINGS: Bowel gas pattern is normal. Cholecystectomy clips in place. Lower spinal fusion. No visible urinary tract calculi. IMPRESSION: No visible urinary tract calculi. Sensitivity for small stones would be limited given patient's size and bowel contents. Electronically Signed   By: Nelson Chimes M.D.   On: 05/17/2022 08:30    Procedures Procedures    Medications  Ordered in ED Medications  ketorolac (TORADOL) 15 MG/ML injection 15 mg (15 mg Intravenous Given 05/17/22 1024)    ED Course/ Medical Decision Making/ A&P                           Medical Decision Making Amount and/or Complexity of Data Reviewed Labs: ordered. Radiology: ordered.  Risk Prescription drug management.   Patient with flank pain.  Left flank.  History of kidney stones.  States is the same pain.   Lab work reassuring.  No white count and urine does not show infection.  Good renal function.  KUB done and showed no definite urinary tract stones.  I reviewed previous urology note.  Ultrasound also does not show  hydronephrosis.  Pain managed at this point.  Unable to get a hold of Dr. Alyson Ingles, as he is not on-call but she would benefit from short-term follow-up.  Does not appear to acquire admission to the hospital at this time.  Will discharge home        Final Clinical Impression(s) / ED Diagnoses Final diagnoses:  Flank pain    Rx / DC Orders ED Discharge Orders     None         Davonna Belling, MD 05/17/22 1057

## 2022-05-18 ENCOUNTER — Other Ambulatory Visit (HOSPITAL_COMMUNITY): Payer: Self-pay

## 2022-05-19 LAB — URINE CULTURE

## 2022-05-21 ENCOUNTER — Other Ambulatory Visit (HOSPITAL_COMMUNITY): Payer: Self-pay

## 2022-05-25 LAB — COMPREHENSIVE METABOLIC PANEL
ALT: 17 IU/L (ref 0–32)
AST: 20 IU/L (ref 0–40)
Albumin/Globulin Ratio: 1.8 (ref 1.2–2.2)
Albumin: 4 g/dL (ref 3.8–4.9)
Alkaline Phosphatase: 103 IU/L (ref 44–121)
BUN/Creatinine Ratio: 17 (ref 9–23)
BUN: 12 mg/dL (ref 6–24)
Bilirubin Total: 0.6 mg/dL (ref 0.0–1.2)
CO2: 25 mmol/L (ref 20–29)
Calcium: 8.9 mg/dL (ref 8.7–10.2)
Chloride: 101 mmol/L (ref 96–106)
Creatinine, Ser: 0.71 mg/dL (ref 0.57–1.00)
Globulin, Total: 2.2 g/dL (ref 1.5–4.5)
Glucose: 94 mg/dL (ref 70–99)
Potassium: 4.3 mmol/L (ref 3.5–5.2)
Sodium: 140 mmol/L (ref 134–144)
Total Protein: 6.2 g/dL (ref 6.0–8.5)
eGFR: 100 mL/min/{1.73_m2} (ref >59)

## 2022-05-25 LAB — TSH: TSH: 1.11 u[IU]/mL (ref 0.450–4.500)

## 2022-05-25 LAB — T4, FREE: Free T4: 0.87 ng/dL (ref 0.82–1.77)

## 2022-05-28 NOTE — Telephone Encounter (Signed)
Returned call to patient to reschedule surgery. No answer. Left message to return call to office.

## 2022-06-03 ENCOUNTER — Other Ambulatory Visit: Payer: Self-pay | Admitting: Internal Medicine

## 2022-06-03 DIAGNOSIS — F5101 Primary insomnia: Secondary | ICD-10-CM

## 2022-06-05 ENCOUNTER — Ambulatory Visit: Payer: No Typology Code available for payment source | Admitting: "Endocrinology

## 2022-06-06 ENCOUNTER — Ambulatory Visit: Payer: No Typology Code available for payment source | Admitting: "Endocrinology

## 2022-06-07 ENCOUNTER — Ambulatory Visit (INDEPENDENT_AMBULATORY_CARE_PROVIDER_SITE_OTHER): Payer: No Typology Code available for payment source | Admitting: "Endocrinology

## 2022-06-07 ENCOUNTER — Encounter: Payer: Self-pay | Admitting: "Endocrinology

## 2022-06-07 ENCOUNTER — Other Ambulatory Visit (HOSPITAL_COMMUNITY): Payer: Self-pay

## 2022-06-07 VITALS — BP 104/64 | HR 76 | Ht <= 58 in | Wt 180.0 lb

## 2022-06-07 DIAGNOSIS — E782 Mixed hyperlipidemia: Secondary | ICD-10-CM

## 2022-06-07 DIAGNOSIS — E1169 Type 2 diabetes mellitus with other specified complication: Secondary | ICD-10-CM

## 2022-06-07 DIAGNOSIS — Z794 Long term (current) use of insulin: Secondary | ICD-10-CM | POA: Diagnosis not present

## 2022-06-07 DIAGNOSIS — I10 Essential (primary) hypertension: Secondary | ICD-10-CM | POA: Diagnosis not present

## 2022-06-07 LAB — POCT GLYCOSYLATED HEMOGLOBIN (HGB A1C): HbA1c, POC (controlled diabetic range): 6.5 % (ref 0.0–7.0)

## 2022-06-07 MED ORDER — TRULICITY 3 MG/0.5ML ~~LOC~~ SOAJ
3.0000 mg | SUBCUTANEOUS | 1 refills | Status: DC
  Filled 2022-06-07: qty 6, 84d supply, fill #0
  Filled 2022-12-13: qty 2, 28d supply, fill #1
  Filled 2023-01-06: qty 2, 28d supply, fill #2
  Filled 2023-02-03: qty 2, 28d supply, fill #3

## 2022-06-07 NOTE — Patient Instructions (Signed)

## 2022-06-07 NOTE — Progress Notes (Signed)
Endocrinology Consult Note       06/07/2022, 8:10 PM   Subjective:    Patient ID: [name], female    DOB: [birth year].  [name] is being seen in consultation for management of currently uncontrolled symptomatic diabetes requested by  Lindell Spar, MD.   Past Medical History:  Diagnosis Date   Diabetes mellitus    insulin pump 2013   Dysphagia 03/09/2020   Epidural hematoma ([place]) 12/14/2021   Hypercholesteremia    Hypertension    Incarcerated umbilical hernia 2/50/5397   Kidney stone    Kidney stones 08/31/2008   Qualifier: Diagnosis of  By: Jonna Munro MD, Cornelius     Neuropathy    Obesity    Obstructive sleep apnea    Palpitations    Shortness of breath     Past Surgical History:  Procedure Laterality Date   BIOPSY  06/23/2020   Procedure: BIOPSY;  Surgeon: Daneil Dolin, MD;  [facility];  Service: Endoscopy;;   CARDIAC CATHETERIZATION  12/11/2012   normal coronary arteries   CESAREAN SECTION  1994;2000   [place]   COLONOSCOPY WITH PROPOFOL N/A 06/23/2020   large amount of stool in entire colon, prep inadequate.    CYSTOSCOPY W/ URETERAL STENT PLACEMENT  05/08/2012   Procedure: CYSTOSCOPY WITH RETROGRADE PYELOGRAM/URETERAL STENT PLACEMENT;  Surgeon: Marissa Nestle, MD;  Location: [facility];  Service: Urology;  Laterality: Right;   CYSTOSCOPY WITH RETROGRADE PYELOGRAM, URETEROSCOPY AND STENT PLACEMENT Right 05/04/2020   Procedure: CYSTOSCOPY WITH RIGHT RETROGRADE PYELOGRAM, RIGHT URETEROSCOPY AND RIGHT URETERAL STENT EXCHANGE;  Surgeon: Cleon Gustin, MD;  Location: [facility];  Service: Urology;  Laterality: Right;   CYSTOSCOPY WITH RETROGRADE PYELOGRAM, URETEROSCOPY AND STENT PLACEMENT Left 08/08/2020   Procedure: CYSTOSCOPY WITH RETROGRADE PYELOGRAM, URETEROSCOPY WITH LASER  AND STENT PLACEMENT;  Surgeon: Cleon Gustin, MD;  Location: [facility];  Service: Urology;  Laterality: Left;   CYSTOSCOPY WITH RETROGRADE PYELOGRAM, URETEROSCOPY AND STENT PLACEMENT Right 11/08/2020   Procedure: CYSTOSCOPY WITH RIGHT RETROGRADE PYELOGRAM, RIGHT URETEROSCOPY AND STENT PLACEMENT;  Surgeon: Cleon Gustin, MD;  Location[facility];  Service: Urology;  Laterality: Right;   CYSTOSCOPY WITH RETROGRADE PYELOGRAM, URETEROSCOPY AND STENT PLACEMENT Left 12/27/2020   Procedure: CYSTOSCOPY WITH LEFT RETROGRADE PYELOGRAM, LEFT URETEROSCOPY AND LEFT URETERAL STENT PLACEMENT;  Surgeon: Cleon Gustin, MD;  [facility];  Service: Urology;  Laterality: Left;   CYSTOSCOPY WITH RETROGRADE PYELOGRAM, URETEROSCOPY AND STENT PLACEMENT Bilateral 03/16/2021   Procedure: CYSTOSCOPY WITH BILATERAL  RETROGRADE PYELOGRAM, BILATERAL URETEROSCOPY AND STENT PLACEMENT ON THE LEFT;  Surgeon: Cleon Gustin, MD;  [facility];  Service: Urology;  Laterality: Bilateral;   CYSTOSCOPY WITH STENT PLACEMENT Right 02/25/2020   Procedure: CYSTOSCOPY WITH RIGHT RETROGRADE RIGHT STENT PLACEMENT;  Surgeon: Festus Aloe, MD;  Location[facility];  Service: Urology;  Laterality: Right;   ESOPHAGOGASTRODUODENOSCOPY (EGD) WITH PROPOFOL N/A 06/23/2020   Normal esophagus, multiple localized erosions were found in gastric antrum s/p biopsy, normal duodenum. Empiric dilation.Reactive gastropathy with erosions, negative H.pylori, no dysplasia.     EXTRACORPOREAL SHOCK  WAVE LITHOTRIPSY Right 10/11/2020   Procedure: EXTRACORPOREAL SHOCK WAVE LITHOTRIPSY (ESWL);  Surgeon: Cleon Gustin, MD;  [facility];  Service: Urology;  Laterality: Right;   FOOT SURGERY Right March, 2013   [facility]-removal of bone spur   HOLMIUM LASER APPLICATION  02/07/6009   Procedure: HOLMIUM LASER LITHOTRIPSY RIGHT URETERAL CALCULUS;  Surgeon: Cleon Gustin, MD;  Location: [facility];  Service: Urology;;   HYSTEROSCOPY WITH THERMACHOICE  04/25/2012   Procedure: HYSTEROSCOPY WITH  THERMACHOICE;  Surgeon: Florian Buff, MD;  Location: [facility];  Service: Gynecology;  Laterality: N/A;  total therapy time= 11 minutes 42 seconds; 34 ml D5w  in and 34 ml D5w out; temp =87 degrees F   LEFT HEART CATHETERIZATION WITH CORONARY ANGIOGRAM N/A 12/11/2012   Procedure: LEFT HEART CATHETERIZATION WITH CORONARY ANGIOGRAM;  Surgeon: Lorretta Harp, MD;  Location[facility] CATH LAB;  Service: Cardiovascular;  Laterality: N/A;   LUMBAR WOUND DEBRIDEMENT N/A 12/14/2021   Procedure: LUMBAR HEMATOMA EVACUATION;  Surgeon: Judith Part, MD;  Location: [place];  Service: Neurosurgery;  Laterality: N/A;   MALONEY DILATION N/A 06/23/2020   Procedure: Venia Minks DILATION;  Surgeon: Daneil Dolin, MD;  [facility];  Service: Endoscopy;  Laterality: N/A;   Sinus sergery  1988   [place]   STONE EXTRACTION WITH BASKET Right 11/08/2020   Procedure: STONE EXTRACTION WITH BASKET;  Surgeon: Cleon Gustin, MD;  Location: [facility];  Service: Urology;  Laterality: Right;   TRANSFORAMINAL LUMBAR INTERBODY FUSION (TLIF) WITH PEDICLE SCREW FIXATION 1 LEVEL N/A 12/05/2021   Procedure: Open Lumbar five-Sacral one Laminectomy/Transforaminal Lumbar Interbody Fusion/Posterolateral instrumented fusion;  Surgeon: Judith Part, MD;  Location: [place];  Service: Neurosurgery;  Laterality: N/A;  3C   UMBILICAL HERNIA REPAIR N/A 03/25/2014   Procedure: UMBILICAL HERNIA REPAIR;  Surgeon: Scherry Ran, MD;  Location: [facility];  Service: General;  Laterality: N/A;   uterine ablation      Social History   Socioeconomic History   Marital status: Married    Spouse name: Not on file   Number of children: 2   Years of education: college   Highest education level: Not on file  Occupational History   Occupation: CNA    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: [employer]   Occupation: Quarry manager- at [employer]  Tobacco Use   Smoking status: Never   Smokeless tobacco: Never  Vaping Use   Vaping Use:  Never used  Substance and Sexual Activity   Alcohol use: No   Drug use: No   Sexual activity: Not Currently    Partners: Male    Birth control/protection: Post-menopausal    Comment: ablation  Other Topics Concern   Not on file  Social History Narrative   Regular exercise: walks Caffeine use:    Social Determinants of Health   Financial Resource Strain: Low Risk  (01/11/2022)   Overall Financial Resource Strain (CARDIA)    Difficulty of Paying Living Expenses: Not hard at all  Food Insecurity: Food Insecurity Present (01/11/2022)   Hunger Vital Sign    Worried About Power in the Last Year: Sometimes true    Ran Out of Food in the Last Year: Never true  Transportation Needs: No Transportation Needs (01/11/2022)   PRAPARE - Hydrologist (Medical): No    Lack of Transportation (Non-Medical): No  Physical Activity: Insufficiently Active (01/11/2022)   Exercise Vital Sign  Days of Exercise per Week: 2 days    Minutes of Exercise per Session: 20 min  Stress: Stress Concern Present (01/11/2022)   [place]    Feeling of Stress : To some extent  Social Connections: Moderately Integrated (01/11/2022)   Social Connection and Isolation Panel [NHANES]    Frequency of Communication with Friends and Family: Three times a week    Frequency of Social Gatherings with Friends and Family: Twice a week    Attends Religious Services: 1 to 4 times per year    Active Member of [employer] or Organizations: No    Attends Music therapist: Never    Marital Status: Married    Family History  Problem Relation Age of Onset   Hypertension Mother    Cancer Mother    Diabetes Mother    Stroke Mother    Diabetes Father    Depression Paternal Grandmother    Depression Paternal Grandfather    Cancer Paternal Uncle    Heart disease Other    Cancer Other    Diabetes Other    Achalasia Other     Anesthesia problems Neg Hx    Hypotension Neg Hx    Malignant hyperthermia Neg Hx    Pseudochol deficiency Neg Hx    Colon cancer Neg Hx    Colon polyps Neg Hx     Outpatient Encounter Medications as of 06/07/2022  Medication Sig   Dulaglutide (TRULICITY) 3 BJ/4.7WG SOPN Inject 3 mg as directed once a week.   albuterol (VENTOLIN HFA) 108 (90 Base) MCG/ACT inhaler Inhale 2 puffs into the lungs every 6 (six) hours as needed for wheezing or shortness of breath.    blood glucose meter kit and supplies Use up to four times daily as directed.   buPROPion (WELLBUTRIN XL) 150 MG 24 hr tablet Take 150 mg by mouth daily.   cyclobenzaprine (FLEXERIL) 10 MG tablet Take 1 tablet (10 mg total) by mouth 3 (three) times daily as needed for muscle spasms.   Empagliflozin-metFORMIN HCl (SYNJARDY) 12.5-500 MG TABS Take 1 tablet by mouth 2 (two) times daily. (Patient taking differently: Take 1 tablet by mouth daily.)   furosemide (LASIX) 20 MG tablet Take 20 mg by mouth daily. Take 1-2 tablets daily   gabapentin (NEURONTIN) 600 MG tablet Take 1 tablet (600 mg total) by mouth 3 (three) times daily.   glucose blood test strip Test up to 4 times a day   hydrOXYzine (VISTARIL) 50 MG capsule TAKE 1 CAPSULE(50 MG) BY MOUTH AT BEDTIME   Lancets (FREESTYLE) lancets Use to test 4 times daily   lisinopril-hydrochlorothiazide (ZESTORETIC) 20-12.5 MG tablet Take 1 tablet by mouth daily.   omeprazole (PRILOSEC) 20 MG capsule Take 1 capsule (20 mg total) by mouth daily.   ondansetron (ZOFRAN) 4 MG tablet Take 1 tablet (4 mg total) by mouth every 8 (eight) hours as needed for nausea or vomiting. (Patient not taking: Reported on 05/17/2022)   oxybutynin (DITROPAN-XL) 5 MG 24 hr tablet Take 5 mg by mouth at bedtime.   oxyCODONE (OXY IR/ROXICODONE) 5 MG immediate release tablet Take 1 tablet (5 mg total) by mouth every 4 (four) hours as needed (pain).   Potassium Citrate (UROCIT-K 15) 15 MEQ (1620 MG) TBCR Take 1 tablet by  mouth in the morning and at bedtime.   simvastatin (ZOCOR) 40 MG tablet Take 1 tablet (40 mg total) by mouth every evening for cholesterol   [DISCONTINUED] Dulaglutide (  TRULICITY) 1.5 OM/7.6HM SOPN Inject 1.5 mg into the skin once a week.   [DISCONTINUED] Dulaglutide (TRULICITY) 1.5 CN/4.7SJ SOPN Inject 1.5 mg into the skin once a week. (Patient not taking: Reported on 05/17/2022)   No facility-administered encounter medications on file as of 06/07/2022.    ALLERGIES: Allergies  Allergen Reactions   Ciprofloxacin Hives and Swelling   Penicillins Anaphylaxis and Rash   Codeine Other (See Comments)    had hallicunations   Morphine Nausea And Vomiting and Other (See Comments)    recieved in ED due to [facility] and had multple doses - gave visual hallucinations and vomitting.   Sulfonamide Derivatives Other (See Comments)    REACTION: Unsure - childhood allergy   Vancomycin Itching    VACCINATION STATUS: Immunization History  Administered Date(s) Administered   H1N1 09/29/2008   Influenza Split 08/25/2012   Influenza Whole 08/31/2008   Influenza,inj,Quad PF,6+ Mos 10/19/2013, 08/30/2021   Moderna Covid-19 Vaccine Bivalent Booster 30yrs & up 09/09/2021   Moderna Sars-Covid-2 Vaccination 01/05/2020, 02/02/2020   Pneumococcal Polysaccharide-23 09/14/2008, 02/21/2014   Tdap 06/28/2020   Zoster Recombinat (Shingrix) 04/04/2022    Diabetes She presents for her follow-up diabetic visit. She has type 2 diabetes mellitus. Her disease course has been improving. Pertinent negatives for hypoglycemia include no confusion, headaches, pallor or seizures. Pertinent negatives for diabetes include no chest pain, no polydipsia, no polyphagia and no polyuria. Symptoms are improving. There are no diabetic complications. Risk factors for coronary artery disease include dyslipidemia, diabetes mellitus, obesity, sedentary lifestyle and hypertension. Her weight is decreasing steadily. Her home blood glucose  trend is decreasing steadily. (She presents with controlled glycemic profile.  Her point-of-care A1c is 6.5%, overall improving from 10.2%.  She is not on insulin anymore.  She is tolerating her Trulicity, and Synjardy.  She presents with 21 pounds weight loss.   )  Hyperlipidemia This is a chronic problem. Exacerbating diseases include diabetes. Pertinent negatives include no chest pain, myalgias or shortness of breath. Current antihyperlipidemic treatment includes statins. Risk factors for coronary artery disease include diabetes mellitus, dyslipidemia, hypertension, obesity, family history and a sedentary lifestyle.  Hypertension This is a chronic problem. The current episode started more than 1 year ago. The problem is controlled. Pertinent negatives include no chest pain, headaches, palpitations or shortness of breath. Risk factors for coronary artery disease include dyslipidemia, diabetes mellitus, post-menopausal state, sedentary lifestyle, family history and obesity. Past treatments include ACE inhibitors and diuretics.     Review of Systems  Constitutional:  Negative for chills, fever and unexpected weight change.  HENT:  Negative for trouble swallowing and voice change.   Eyes:  Negative for visual disturbance.  Respiratory:  Negative for cough, shortness of breath and wheezing.   Cardiovascular:  Negative for chest pain, palpitations and leg swelling.  Gastrointestinal:  Negative for diarrhea, nausea and vomiting.  Endocrine: Negative for cold intolerance, heat intolerance, polydipsia, polyphagia and polyuria.  Musculoskeletal:  Negative for arthralgias and myalgias.  Skin:  Negative for color change, pallor, rash and wound.  Neurological:  Negative for seizures and headaches.  Psychiatric/Behavioral:  Negative for confusion and suicidal ideas.     Objective:       06/07/2022    3:34 PM 05/17/2022   10:30 AM 05/17/2022    9:30 AM  Vitals with BMI  Height $Remov'4\' 8"'ANMPit$     Weight 180 lbs     BMI 62.83    Systolic [phone]  Diastolic 64 73 66  Pulse  76 71 65    BP 104/64   Pulse 76   Ht _0  (1.422 m)   Wt 180 lb (81.6 kg)   LMP 07/16/2012   BMI 40.36 kg/m   Wt Readings from Last 3 Encounters:  06/07/22 180 lb (81.6 kg)  05/17/22 181 lb (82.1 kg)  05/09/22 181 lb (82.1 kg)       CMP ( most recent) CMP     Component Value Date/Time   NA 140 05/24/2022 0918   K 4.3 05/24/2022 0918   CL 101 05/24/2022 0918   CO2 25 05/24/2022 0918   GLUCOSE 94 05/24/2022 0918   GLUCOSE 131 (H) 05/17/2022 0659   BUN 12 05/24/2022 0918   CREATININE 0.71 05/24/2022 0918   CREATININE 0.89 05/25/2020 1626   CALCIUM 8.9 05/24/2022 0918   PROT 6.2 05/24/2022 0918   ALBUMIN 4.0 05/24/2022 0918   AST 20 05/24/2022 0918   ALT 17 05/24/2022 0918   ALKPHOS 103 05/24/2022 0918   BILITOT 0.6 05/24/2022 0918   GFRNONAA >60 05/17/2022 0659   GFRAA >60 08/05/2020 1248     Diabetic Labs (most recent): Lab Results  Component Value Date   HGBA1C 6.5 06/07/2022   HGBA1C 7.7 (A) 03/05/2022   HGBA1C 5.7 (H) 11/30/2021   MICROALBUR 1.10 11/14/2012     Lipid Panel ( most recent) Lipid Panel     Component Value Date/Time   CHOL 117 11/30/2021 1415   TRIG 84 11/30/2021 1415   HDL 47 11/30/2021 1415   CHOLHDL 4.0 07/20/2013 1100   VLDL 48 (H) 07/20/2013 1100   LDLCALC 53 11/30/2021 1415   LABVLDL 17 11/30/2021 1415      Lab Results  Component Value Date   TSH 1.110 05/24/2022   TSH 1.438 11/18/2012   TSH 4.326 09/01/2008   FREET4 0.87 05/24/2022           Assessment & Plan:   Type 2 diabetes, unspecified complication using insulin pump  - [name] has currently uncontrolled symptomatic type 2 DM since  56 years of age,.  Recent labs reviewed. She presents with controlled glycemic profile.  Her point-of-care A1c is 6.5%, overall improving from 10.2%.  She is not on insulin anymore.  She is tolerating her Trulicity, and Synjardy.  She presents with 21  pounds weight loss.    She has not achieved significant excess after she was taken off of insulin. - I had a long discussion with her about the progressive nature of diabetes and the pathology behind its complications.  She does not report gross complications from her diabetes, however she remains at a high risk for more acute and chronic complications which include CAD, CVA, CKD, retinopathy, and neuropathy. These are all discussed in detail with her.  - I discussed all available options of managing her diabetes including de-escalation of medications. I have counseled her on diet  and weight management  by adopting a Whole Food , Plant Predominant  ( WFPP) nutrition as recommended by [employer] of Lifestyle Medicine. Patient is encouraged to switch to  unprocessed or minimally processed  complex starch, adequate protein intake (mainly plant source), minimal liquid fat ( mainly vegetable oils), plenty of fruits, and vegetables. -  she is advised to stick to a routine mealtimes to eat 3 complete meals a day and snack only when necessary ( to snack only to correct hypoglycemia BG <70 day time or <100 at night).   - she acknowledges that there is  a room for improvement in her food and drink choices. - Suggestion is made for her to avoid simple carbohydrates  from her diet including Cakes, Sweet Desserts, Ice Cream, Soda (diet and regular), Sweet Tea, Candies, Chips, Cookies, Store Bought Juices, Alcohol , Artificial Sweeteners,  Coffee Creamer, and "Sugar-free" Products, Lemonade. This will help patient to have more stable blood glucose profile and potentially avoid unintended weight gain.  The following Lifestyle Medicine recommendations according to [place]  [facility]) were discussed and and offered to patient and she  agrees to start the journey:  A. Whole Foods, Plant-Based Nutrition comprising of fruits and vegetables, plant-based proteins, whole-grain carbohydrates  was discussed in detail with the patient.   A list for source of those nutrients were also provided to the patient.  Patient will use only water or unsweetened tea for hydration. B.  The need to stay away from risky substances including alcohol, smoking; obtaining 7 to 9 hours of restorative sleep, at least 150 minutes of moderate intensity exercise weekly, the importance of healthy social connections,  and stress management techniques were discussed. C.  A full color page of  Calorie density of various food groups per pound showing examples of each food groups was provided to the patient.    - she will be scheduled with Jearld Fenton, RDN, CDE for individualized diabetes education.  She would like to stay off of insulin.  I discussed and increased her Trulicity to 3 mg subcutaneously weekly.  She is advised to continue Synjardy 12.5/500 mg once a day at breakfast.  - she is encouraged to call clinic for blood glucose levels less than 70 or above 150 mg per DL at fasting.     - Specific targets for  A1c;  LDL, HDL,  and Triglycerides were discussed with the patient.  2) Blood Pressure /Hypertension:    Blood pressure is controlled to target   she is advised to continue her current medications including lisinopril/HCTZ 20-12 0.5 mg p.o. daily with breakfast .  3) Lipids/Hyperlipidemia:   Review of her recent lipid panel showed  controlled  LDL at 53 .  This is an improvement from overall LDL of 113.  she  is advised to continue simvastatin 40 mg p.o. daily at bedtime.    Side effects and precautions discussed with her.  4)  Weight/Diet:  Body mass index is 40.36 kg/m.  -She has lost 21 pounds so far.  Clearly complicating her diabetes care.   she is  a candidate for weight loss. I discussed with her the fact that loss of 5 - 10% of her  current body weight will have the most impact on her diabetes management.  The above detailed  AC[facility] recommendations for nutrition, exercise, sleep, social  life, avoidance of risky substances, the need for restorative sleep   information will also detailed on discharge instructions.  5) Chronic Care/Health Maintenance:  -she  is on ACEI/ARB and Statin medications and  is encouraged to initiate and continue to follow up with Ophthalmology, Dentist,  Podiatrist at least yearly or according to recommendations, and advised to   stay away from smoking. I have recommended yearly flu vaccine and pneumonia vaccine at least every 5 years; moderate intensity exercise for up to 150 minutes weekly; and  sleep for 7- 9 hours a day.  - she is  advised to maintain close follow up with Lindell Spar, MD for primary care needs, as well as her other  providers for optimal and coordinated care.    I spent 40 minutes in the care of the patient today including review of labs from [place], Lipids, Thyroid Function, Hematology (current and previous including abstractions from other facilities); face-to-face time discussing  her blood glucose readings/logs, discussing hypoglycemia and hyperglycemia episodes and symptoms, medications doses, her options of short and long term treatment based on the latest standards of care / guidelines;  discussion about incorporating lifestyle medicine;  and documenting the encounter. Risk reduction counseling performed per USPSTF guidelines to reduce obesity and cardiovascular risk factors.     Please refer to Patient Instructions for Blood Glucose Monitoring and Insulin/Medications Dosing Guide"  in media tab for additional information. Please  also refer to " Patient Self Inventory" in the Media  tab for reviewed elements of pertinent patient history.  Evon Slack participated in the discussions, expressed understanding, and voiced agreement with the above plans.  All questions were answered to her satisfaction. she is encouraged to contact clinic should she have any questions or concerns prior to her return visit.    Follow up plan: -  Return in about 6 months (around 12/07/2022) for F/U with Pre-visit Labs, Meter/CGM/Logs, A1c here, Urine MA - [place].  Glade Lloyd, MD [facility] [address] [place], [place] [address] Phone: [phone]  Fax: [phone]    06/07/2022, 8:10 PM  This note was partially dictated with voice recognition software. Similar sounding words can be transcribed inadequately or may not  be corrected upon review.

## 2022-06-14 ENCOUNTER — Other Ambulatory Visit (HOSPITAL_COMMUNITY): Payer: Self-pay

## 2022-06-15 ENCOUNTER — Other Ambulatory Visit (HOSPITAL_COMMUNITY): Payer: Self-pay

## 2022-06-15 ENCOUNTER — Other Ambulatory Visit: Payer: Self-pay | Admitting: Internal Medicine

## 2022-06-15 MED ORDER — GABAPENTIN 600 MG PO TABS
600.0000 mg | ORAL_TABLET | Freq: Three times a day (TID) | ORAL | 2 refills | Status: DC
  Filled 2022-06-15: qty 90, 30d supply, fill #0
  Filled 2022-07-13: qty 90, 30d supply, fill #1
  Filled 2022-08-11: qty 90, 30d supply, fill #2

## 2022-06-25 ENCOUNTER — Encounter (HOSPITAL_COMMUNITY)
Admission: RE | Admit: 2022-06-25 | Discharge: 2022-06-25 | Disposition: A | Discharge status: Home / Self Care | Payer: No Typology Code available for payment source | Source: Ambulatory Visit | Attending: Urology | Admitting: Urology

## 2022-06-27 MED ORDER — GENTAMICIN SULFATE 40 MG/ML IJ SOLN
300.0000 mg | INTRAVENOUS | Status: AC
  Administered 2022-06-28: 300 mg via INTRAVENOUS
  Filled 2022-06-27: qty 7.5

## 2022-06-28 ENCOUNTER — Ambulatory Visit (HOSPITAL_COMMUNITY): Payer: No Typology Code available for payment source | Admitting: Anesthesiology

## 2022-06-28 ENCOUNTER — Ambulatory Visit (HOSPITAL_BASED_OUTPATIENT_CLINIC_OR_DEPARTMENT_OTHER): Payer: No Typology Code available for payment source | Admitting: Anesthesiology

## 2022-06-28 ENCOUNTER — Ambulatory Visit (HOSPITAL_COMMUNITY)
Admission: RE | Admit: 2022-06-28 | Discharge: 2022-06-28 | Disposition: A | Discharge status: Home / Self Care | Payer: No Typology Code available for payment source | Attending: Urology | Admitting: Urology

## 2022-06-28 ENCOUNTER — Encounter (HOSPITAL_COMMUNITY)
Admission: RE | Disposition: A | Discharge status: Home / Self Care | Payer: Self-pay | Source: Home / Self Care | Attending: Urology

## 2022-06-28 ENCOUNTER — Ambulatory Visit (HOSPITAL_COMMUNITY): Payer: No Typology Code available for payment source

## 2022-06-28 DIAGNOSIS — E1140 Type 2 diabetes mellitus with diabetic neuropathy, unspecified: Secondary | ICD-10-CM | POA: Diagnosis not present

## 2022-06-28 DIAGNOSIS — N200 Calculus of kidney: Secondary | ICD-10-CM | POA: Insufficient documentation

## 2022-06-28 DIAGNOSIS — G4733 Obstructive sleep apnea (adult) (pediatric): Secondary | ICD-10-CM | POA: Diagnosis not present

## 2022-06-28 DIAGNOSIS — I10 Essential (primary) hypertension: Secondary | ICD-10-CM | POA: Diagnosis not present

## 2022-06-28 DIAGNOSIS — M1990 Unspecified osteoarthritis, unspecified site: Secondary | ICD-10-CM | POA: Diagnosis not present

## 2022-06-28 DIAGNOSIS — Z833 Family history of diabetes mellitus: Secondary | ICD-10-CM | POA: Diagnosis not present

## 2022-06-28 DIAGNOSIS — Z7984 Long term (current) use of oral hypoglycemic drugs: Secondary | ICD-10-CM

## 2022-06-28 DIAGNOSIS — E119 Type 2 diabetes mellitus without complications: Secondary | ICD-10-CM | POA: Diagnosis not present

## 2022-06-28 DIAGNOSIS — N201 Calculus of ureter: Secondary | ICD-10-CM

## 2022-06-28 HISTORY — PX: CYSTOSCOPY WITH RETROGRADE PYELOGRAM, URETEROSCOPY AND STENT PLACEMENT: SHX5789

## 2022-06-28 LAB — GLUCOSE, CAPILLARY
Glucose-Capillary: 104 mg/dL — ABNORMAL HIGH (ref 70–99)
Glucose-Capillary: 108 mg/dL — ABNORMAL HIGH (ref 70–99)

## 2022-06-28 SURGERY — CYSTOURETEROSCOPY, WITH RETROGRADE PYELOGRAM AND STENT INSERTION
Anesthesia: General | Site: Ureter | Laterality: Left

## 2022-06-28 MED ORDER — ONDANSETRON HCL 4 MG/2ML IJ SOLN
4.0000 mg | Freq: Once | INTRAMUSCULAR | Status: AC | PRN
Start: 2022-06-28 — End: 2022-06-28
  Administered 2022-06-28: 4 mg via INTRAVENOUS
  Filled 2022-06-28: qty 2

## 2022-06-28 MED ORDER — OXYCODONE HCL 5 MG PO TABS
5.0000 mg | ORAL_TABLET | Freq: Once | ORAL | Status: AC
  Administered 2022-06-28: 5 mg via ORAL

## 2022-06-28 MED ORDER — DIATRIZOATE MEGLUMINE 30 % UR SOLN
URETHRAL | Status: AC
  Filled 2022-06-28: qty 100

## 2022-06-28 MED ORDER — NITROFURANTOIN MONOHYD MACRO 100 MG PO CAPS: 100.0000 mg | ORAL_CAPSULE | Freq: Two times a day (BID) | ORAL | 0 refills | Status: DC

## 2022-06-28 MED ORDER — MIDAZOLAM HCL 5 MG/5ML IJ SOLN
INTRAMUSCULAR | Status: DC | PRN
  Administered 2022-06-28: 2 mg via INTRAVENOUS

## 2022-06-28 MED ORDER — FENTANYL CITRATE (PF) 100 MCG/2ML IJ SOLN
INTRAMUSCULAR | Status: AC
  Filled 2022-06-28: qty 2

## 2022-06-28 MED ORDER — HYDROMORPHONE HCL 1 MG/ML IJ SOLN
0.2500 mg | INTRAMUSCULAR | Status: DC | PRN
  Administered 2022-06-28 (×2): 0.5 mg via INTRAVENOUS
  Filled 2022-06-28 (×2): qty 0.5

## 2022-06-28 MED ORDER — MEPERIDINE HCL 50 MG/ML IJ SOLN: 6.2500 mg | INTRAMUSCULAR | Status: DC | PRN

## 2022-06-28 MED ORDER — DEXMEDETOMIDINE HCL IN NACL 80 MCG/20ML IV SOLN
INTRAVENOUS | Status: AC
  Filled 2022-06-28: qty 20

## 2022-06-28 MED ORDER — MIDAZOLAM HCL 2 MG/2ML IJ SOLN
INTRAMUSCULAR | Status: AC
  Filled 2022-06-28: qty 2

## 2022-06-28 MED ORDER — LIDOCAINE HCL (CARDIAC) PF 100 MG/5ML IV SOSY
PREFILLED_SYRINGE | INTRAVENOUS | Status: DC | PRN
  Administered 2022-06-28: 80 mg via INTRAVENOUS

## 2022-06-28 MED ORDER — ORAL CARE MOUTH RINSE: 15.0000 mL | Freq: Once | OROMUCOSAL | Status: AC

## 2022-06-28 MED ORDER — PROPOFOL 10 MG/ML IV BOLUS
INTRAVENOUS | Status: AC
  Filled 2022-06-28: qty 20

## 2022-06-28 MED ORDER — EPHEDRINE SULFATE (PRESSORS) 50 MG/ML IJ SOLN
INTRAMUSCULAR | Status: DC | PRN
  Administered 2022-06-28: 5 mg via INTRAVENOUS
  Administered 2022-06-28: 10 mg via INTRAVENOUS

## 2022-06-28 MED ORDER — DIATRIZOATE MEGLUMINE 30 % UR SOLN
URETHRAL | Status: DC | PRN
  Administered 2022-06-28: 100 mL via URETHRAL

## 2022-06-28 MED ORDER — KETOROLAC TROMETHAMINE 30 MG/ML IJ SOLN
INTRAMUSCULAR | Status: AC
  Filled 2022-06-28: qty 1

## 2022-06-28 MED ORDER — PHENYLEPHRINE HCL (PRESSORS) 10 MG/ML IV SOLN
INTRAVENOUS | Status: DC | PRN
  Administered 2022-06-28: 160 ug via INTRAVENOUS
  Administered 2022-06-28 (×4): 80 ug via INTRAVENOUS

## 2022-06-28 MED ORDER — ONDANSETRON HCL 4 MG/2ML IJ SOLN
INTRAMUSCULAR | Status: AC
  Filled 2022-06-28: qty 2

## 2022-06-28 MED ORDER — WATER FOR IRRIGATION, STERILE IR SOLN
Status: DC | PRN
  Administered 2022-06-28: 500 mL

## 2022-06-28 MED ORDER — LACTATED RINGERS IV SOLN
INTRAVENOUS | Status: DC
Start: 2022-06-28 — End: 2022-06-28

## 2022-06-28 MED ORDER — PHENYLEPHRINE 80 MCG/ML (10ML) SYRINGE FOR IV PUSH (FOR BLOOD PRESSURE SUPPORT)
PREFILLED_SYRINGE | INTRAVENOUS | Status: AC
  Filled 2022-06-28: qty 10

## 2022-06-28 MED ORDER — OXYCODONE HCL 5 MG PO TABS
ORAL_TABLET | ORAL | Status: AC
  Filled 2022-06-28: qty 1

## 2022-06-28 MED ORDER — EPHEDRINE 5 MG/ML INJ
INTRAVENOUS | Status: AC
  Filled 2022-06-28: qty 5

## 2022-06-28 MED ORDER — SODIUM CHLORIDE 0.9 % IR SOLN
Status: DC | PRN
  Administered 2022-06-28 (×2): 3000 mL

## 2022-06-28 MED ORDER — OXYCODONE HCL 5 MG PO TABS: 5.0000 mg | ORAL_TABLET | ORAL | 0 refills | Status: DC | PRN

## 2022-06-28 MED ORDER — FENTANYL CITRATE (PF) 100 MCG/2ML IJ SOLN
INTRAMUSCULAR | Status: DC | PRN
  Administered 2022-06-28 (×2): 50 ug via INTRAVENOUS

## 2022-06-28 MED ORDER — PROPOFOL 10 MG/ML IV BOLUS
INTRAVENOUS | Status: DC | PRN
  Administered 2022-06-28: 150 mg via INTRAVENOUS

## 2022-06-28 MED ORDER — LIDOCAINE HCL (PF) 2 % IJ SOLN
INTRAMUSCULAR | Status: AC
  Filled 2022-06-28: qty 5

## 2022-06-28 MED ORDER — ROCURONIUM BROMIDE 100 MG/10ML IV SOLN
INTRAVENOUS | Status: DC | PRN
  Administered 2022-06-28: 40 mg via INTRAVENOUS
  Administered 2022-06-28: 10 mg via INTRAVENOUS

## 2022-06-28 MED ORDER — DEXAMETHASONE SODIUM PHOSPHATE 10 MG/ML IJ SOLN
INTRAMUSCULAR | Status: DC | PRN
  Administered 2022-06-28: 10 mg via INTRAVENOUS

## 2022-06-28 MED ORDER — DEXAMETHASONE SODIUM PHOSPHATE 10 MG/ML IJ SOLN
INTRAMUSCULAR | Status: AC
  Filled 2022-06-28: qty 1

## 2022-06-28 MED ORDER — ONDANSETRON HCL 4 MG/2ML IJ SOLN
INTRAMUSCULAR | Status: DC | PRN
  Administered 2022-06-28: 4 mg via INTRAVENOUS

## 2022-06-28 MED ORDER — CHLORHEXIDINE GLUCONATE 0.12 % MT SOLN
15.0000 mL | Freq: Once | OROMUCOSAL | Status: AC
  Administered 2022-06-28: 15 mL via OROMUCOSAL

## 2022-06-28 MED ORDER — HYDROCORTISONE SOD SUC (PF) 100 MG IJ SOLR
INTRAMUSCULAR | Status: AC
  Filled 2022-06-28: qty 2

## 2022-06-28 MED ORDER — SUGAMMADEX SODIUM 200 MG/2ML IV SOLN
INTRAVENOUS | Status: DC | PRN
  Administered 2022-06-28: 200 mg via INTRAVENOUS

## 2022-06-28 MED ORDER — ROCURONIUM BROMIDE 10 MG/ML (PF) SYRINGE
PREFILLED_SYRINGE | INTRAVENOUS | Status: AC
  Filled 2022-06-28: qty 10

## 2022-06-28 SURGICAL SUPPLY — 26 items
BAG DRAIN URO TABLE W/ADPT NS (BAG) ×4 IMPLANT
BAG DRN 8 ADPR NS SKTRN CSTL (BAG) ×2
BAG HAMPER (MISCELLANEOUS) ×4 IMPLANT
CATH INTERMIT  6FR 70CM (CATHETERS) ×4 IMPLANT
CLOTH BEACON ORANGE TIMEOUT ST (SAFETY) ×4 IMPLANT
EXTRACTOR STONE NITINOL NGAGE (UROLOGICAL SUPPLIES) ×2 IMPLANT
GLOVE BIO SURGEON STRL SZ8 (GLOVE) ×4 IMPLANT
GLOVE BIOGEL PI IND STRL 7.0 (GLOVE) ×6 IMPLANT
GLOVE BIOGEL PI INDICATOR 7.0 (GLOVE) ×2
GOWN STRL REUS W/TWL LRG LVL3 (GOWN DISPOSABLE) ×4 IMPLANT
GOWN STRL REUS W/TWL XL LVL3 (GOWN DISPOSABLE) ×4 IMPLANT
GUIDEWIRE STR DUAL SENSOR (WIRE) ×4 IMPLANT
GUIDEWIRE STR ZIPWIRE 035X150 (MISCELLANEOUS) ×4 IMPLANT
IV NS IRRIG 3000ML ARTHROMATIC (IV SOLUTION) ×8 IMPLANT
KIT TURNOVER CYSTO (KITS) ×4 IMPLANT
MANIFOLD NEPTUNE II (INSTRUMENTS) ×4 IMPLANT
PACK CYSTO (CUSTOM PROCEDURE TRAY) ×4 IMPLANT
PAD ARMBOARD 7.5X6 YLW CONV (MISCELLANEOUS) ×4 IMPLANT
SHEATH URETERAL 12FRX35CM (MISCELLANEOUS) ×2 IMPLANT
STENT URET 6FRX24 CONTOUR (STENTS) ×2 IMPLANT
SYR 10ML LL (SYRINGE) ×4 IMPLANT
SYR CONTROL 10ML LL (SYRINGE) ×4 IMPLANT
TOWEL OR 17X26 4PK STRL BLUE (TOWEL DISPOSABLE) ×4 IMPLANT
TRACTIP FLEXIVA PULS ID 200XHI (Laser) IMPLANT
TRACTIP FLEXIVA PULSE ID 200 (Laser)
WATER STERILE IRR 500ML POUR (IV SOLUTION) ×4 IMPLANT

## 2022-06-28 NOTE — Anesthesia Procedure Notes (Signed)
Procedure Name: Intubation Date/Time: 06/28/2022 7:45 AM  Performed by: Denese Killings, MDPre-anesthesia Checklist: Patient identified, Emergency Drugs available, Suction available and Patient being monitored Patient Re-evaluated:Patient Re-evaluated prior to induction Oxygen Delivery Method: Circle system utilized Preoxygenation: Pre-oxygenation with 100% oxygen Induction Type: IV induction Ventilation: Mask ventilation without difficulty Laryngoscope Size: Mac and 3 Grade View: Grade I Tube type: Oral Tube size: 7.0 mm Number of attempts: 1 Airway Equipment and Method: Stylet Placement Confirmation: ETT inserted through vocal cords under direct vision, positive ETCO2 and breath sounds checked- equal and bilateral Secured at: 21 cm Tube secured with: Tape Dental Injury: Teeth and Oropharynx as per pre-operative assessment

## 2022-06-28 NOTE — H&P (Signed)
Urology Admission H&P  Chief Complaint: left flank pain  History of Present Illness:  Ms Pai is a 55yo here for left ureteroscopic stone extraction. She has recurrent left renal calculi and intermittent left flank pain.,  Past Medical History:  Diagnosis Date   Diabetes mellitus    insulin pump 2013   Dysphagia 03/09/2020   Epidural hematoma (HCC) 12/14/2021   Hypercholesteremia    Hypertension    Incarcerated umbilical hernia 03/25/2014   Kidney stone    Kidney stones 08/31/2008   Qualifier: Diagnosis of  By: Erby Pian MD, Cornelius     Neuropathy    Obesity    Obstructive sleep apnea    Palpitations    Shortness of breath    Past Surgical History:  Procedure Laterality Date   BIOPSY  06/23/2020   Procedure: BIOPSY;  Surgeon: Corbin Ade, MD;  [facility];  Service: Endoscopy;;   CARDIAC CATHETERIZATION  12/11/2012   normal coronary arteries   CESAREAN SECTION  1994;2000   [facility]   CHOLECYSTECTOMY  1992   COLONOSCOPY WITH PROPOFOL N/A 06/23/2020   large amount of stool in entire colon, prep inadequate.    CYSTOSCOPY W/ URETERAL STENT PLACEMENT  05/08/2012   Procedure: CYSTOSCOPY WITH RETROGRADE PYELOGRAM/URETERAL STENT PLACEMENT;  Surgeon: Ky Barban, MD;  Location: [facility];  Service: Urology;  Laterality: Right;   CYSTOSCOPY WITH RETROGRADE PYELOGRAM, URETEROSCOPY AND STENT PLACEMENT Right 05/04/2020   Procedure: CYSTOSCOPY WITH RIGHT RETROGRADE PYELOGRAM, RIGHT URETEROSCOPY AND RIGHT URETERAL STENT EXCHANGE;  Surgeon: Malen Gauze, MD;  Location: [facility];  Service: Urology;  Laterality: Right;   CYSTOSCOPY WITH RETROGRADE PYELOGRAM, URETEROSCOPY AND STENT PLACEMENT Left 08/08/2020   Procedure: CYSTOSCOPY WITH RETROGRADE PYELOGRAM, URETEROSCOPY WITH LASER  AND STENT PLACEMENT;  Surgeon: Malen Gauze, MD;  Location: [facility];  Service: Urology;  Laterality: Left;   CYSTOSCOPY WITH RETROGRADE PYELOGRAM, URETEROSCOPY AND STENT PLACEMENT Right  11/08/2020   Procedure: CYSTOSCOPY WITH RIGHT RETROGRADE PYELOGRAM, RIGHT URETEROSCOPY AND STENT PLACEMENT;  Surgeon: Malen Gauze, MD;  Location: [facility];  Service: Urology;  Laterality: Right;   CYSTOSCOPY WITH RETROGRADE PYELOGRAM, URETEROSCOPY AND STENT PLACEMENT Left 12/27/2020   Procedure: CYSTOSCOPY WITH LEFT RETROGRADE PYELOGRAM, LEFT URETEROSCOPY AND LEFT URETERAL STENT PLACEMENT;  Surgeon: Malen Gauze, MD;  Location: [facility];  Service: Urology;  Laterality: Left;   CYSTOSCOPY WITH RETROGRADE PYELOGRAM, URETEROSCOPY AND STENT PLACEMENT Bilateral 03/16/2021   Procedure: CYSTOSCOPY WITH BILATERAL  RETROGRADE PYELOGRAM, BILATERAL URETEROSCOPY AND STENT PLACEMENT ON THE LEFT;  Surgeon: Malen Gauze, MD;  Location: [facility];  Service: Urology;  Laterality: Bilateral;   CYSTOSCOPY WITH STENT PLACEMENT Right 02/25/2020   Procedure: CYSTOSCOPY WITH RIGHT RETROGRADE RIGHT STENT PLACEMENT;  Surgeon: Jerilee Field, MD;  Location: [facility];  Service: Urology;  Laterality: Right;   ESOPHAGOGASTRODUODENOSCOPY (EGD) WITH PROPOFOL N/A 06/23/2020   Normal esophagus, multiple localized erosions were found in gastric antrum s/p biopsy, normal duodenum. Empiric dilation.Reactive gastropathy with erosions, negative H.pylori, no dysplasia.     EXTRACORPOREAL SHOCK WAVE LITHOTRIPSY Right 10/11/2020   Procedure: EXTRACORPOREAL SHOCK WAVE LITHOTRIPSY (ESWL);  Surgeon: Malen Gauze, MD;  Location: [facility];  Service: Urology;  Laterality: Right;   FOOT SURGERY Right March, 2013   [facility]-removal of bone spur   HOLMIUM LASER APPLICATION  05/04/2020   Procedure: HOLMIUM LASER LITHOTRIPSY RIGHT URETERAL CALCULUS;  Surgeon: Malen Gauze, MD;  Location: [facility];  Service: Urology;;   HYSTEROSCOPY WITH THERMACHOICE  04/25/2012  Procedure: HYSTEROSCOPY WITH THERMACHOICE;  Surgeon: Lazaro Arms, MD;  Location: [facility];  Service: Gynecology;  Laterality: N/A;  total therapy time= 11 minutes  42 seconds; 34 ml D5w  in and 34 ml D5w out; temp =87 degrees F   LEFT HEART CATHETERIZATION WITH CORONARY ANGIOGRAM N/A 12/11/2012   Procedure: LEFT HEART CATHETERIZATION WITH CORONARY ANGIOGRAM;  Surgeon: Runell Gess, MD;  Location: [facility] CATH LAB;  Service: Cardiovascular;  Laterality: N/A;   LUMBAR WOUND DEBRIDEMENT N/A 12/14/2021   Procedure: LUMBAR HEMATOMA EVACUATION;  Surgeon: Jadene Pierini, MD;  Location: [facility];  Service: Neurosurgery;  Laterality: N/A;   MALONEY DILATION N/A 06/23/2020   Procedure: [name] DILATION;  Surgeon: Corbin Ade, MD;  [facility];  Service: Endoscopy;  Laterality: N/A;   Sinus sergery  1988   [place]   STONE EXTRACTION WITH BASKET Right 11/08/2020   Procedure: STONE EXTRACTION WITH BASKET;  Surgeon: Malen Gauze, MD;  Location: [facility];  Service: Urology;  Laterality: Right;   TRANSFORAMINAL LUMBAR INTERBODY FUSION (TLIF) WITH PEDICLE SCREW FIXATION 1 LEVEL N/A 12/05/2021   Procedure: Open Lumbar five-Sacral one Laminectomy/Transforaminal Lumbar Interbody Fusion/Posterolateral instrumented fusion;  Surgeon: Jadene Pierini, MD;  Location: [facility];  Service: Neurosurgery;  Laterality: N/A;  3C   UMBILICAL HERNIA REPAIR N/A 03/25/2014   Procedure: UMBILICAL HERNIA REPAIR;  Surgeon: Marlane Hatcher, MD;  Location: [facility];  Service: General;  Laterality: N/A;   uterine ablation      Home Medications:  Current Facility-Administered Medications  Medication Dose Route Frequency Provider Last Rate Last Admin   gentamicin (GARAMYCIN) 300 mg in dextrose 5 % 100 mL IVPB  300 mg Intravenous 30 min Pre-Op Jaydn Moscato, Mardene Celeste, MD       lactated ringers infusion   Intravenous Continuous Molli Barrows, MD 50 mL/hr at 06/28/22 0718 New Bag at 06/28/22 1610   Allergies:  Allergies  Allergen Reactions   Ciprofloxacin Hives and Swelling   Penicillins Anaphylaxis and Rash   Codeine Other (See Comments)    had hallicunations   Morphine  Nausea And Vomiting and Other (See Comments)    recieved [facility] ED [facility] and had multple doses - gave visual hallucinations and vomitting.   Sulfonamide Derivatives Other (See Comments)    REACTION: Unsure - childhood allergy   Vancomycin Itching    Family History  Problem Relation Age of Onset   Hypertension Mother    Cancer Mother    Diabetes Mother    Stroke Mother    Diabetes Father    Depression Paternal Grandmother    Depression Paternal Grandfather    Cancer Paternal Uncle    Heart disease Other    Cancer Other    Diabetes Other    Achalasia Other    Anesthesia problems Neg Hx    Hypotension Neg Hx    Malignant hyperthermia Neg Hx    Pseudochol deficiency Neg Hx    Colon cancer Neg Hx    Colon polyps Neg Hx    Social History:  reports that she has never smoked. She has never used smokeless tobacco. She reports that she does not drink alcohol and does not use drugs.  Review of Systems  Genitourinary:  Positive for flank pain.  All other systems reviewed and are negative.   Physical Exam:  Vital signs in last 24 hours: Temp:  [98.2 F (36.8 C)] 98.2 F (36.8 C) (07/20 0701) Pulse Rate:  [74] 74 (  07/20 0701) Resp:  [13] 13 (07/20 0701) BP: (115)/(73) 115/73 (07/20 0701) SpO2:  [100 %] 100 % (07/20 0701) Weight:  [81.6 kg] 81.6 kg (07/20 0701) Physical Exam Constitutional:      Appearance: Normal appearance.  HENT:     Head: Normocephalic and atraumatic.     Mouth/Throat:     Mouth: Mucous membranes are dry.  Eyes:     Extraocular Movements: Extraocular movements intact.     Pupils: Pupils are equal, round, and reactive to light.  Cardiovascular:     Rate and Rhythm: Normal rate and regular rhythm.  Pulmonary:     Effort: Pulmonary effort is normal. No respiratory distress.  Abdominal:     General: Abdomen is flat. There is no distension.  Musculoskeletal:        General: No swelling. Normal range of motion.     Cervical back: Normal range  of motion and neck supple. No rigidity.  Skin:    General: Skin is warm and dry.  Neurological:     General: No focal deficit present.     Mental Status: She is alert and oriented to person, place, and time.  Psychiatric:        Mood and Affect: Mood normal.        Behavior: Behavior normal.        Thought Content: Thought content normal.        Judgment: Judgment normal.     Laboratory Data:  Results for orders placed or performed during the hospital encounter of 06/28/22 (from the past 24 hour(s))  Glucose, capillary     Status: Abnormal   Collection Time: 06/28/22  6:54 AM  Result Value Ref Range   Glucose-Capillary 104 (H) 70 - 99 mg/dL   No results found for this or any previous visit (from the past 240 hour(s)). Creatinine: No results for input(s): "CREATININE" in the last 168 hours. Baseline Creatinine: unknwon  Impression/Assessment:  55yo with left renal calculi  Plan:  -We discussed the management of kidney stones. These options include observation, ureteroscopy, shockwave lithotripsy (ESWL) and percutaneous nephrolithotomy (PCNL). We discussed which options are relevant to the patient's stone(s). We discussed the natural history of kidney stones as well as the complications of untreated stones and the impact on quality of life without treatment as well as with each of the above listed treatments. We also discussed the efficacy of each treatment in its ability to clear the stone burden. With any of these management options I discussed the signs and symptoms of infection and the need for emergent treatment should these be experienced. For each option we discussed the ability of each procedure to clear the patient of their stone burden.   For observation I described the risks which include but are not limited to silent renal damage, life-threatening infection, need for emergent surgery, failure to pass stone and pain.   For ureteroscopy I described the risks which include  bleeding, infection, damage to contiguous structures, positioning injury, ureteral stricture, ureteral avulsion, ureteral injury, need for prolonged ureteral stent, inability to perform ureteroscopy, need for an interval procedure, inability to clear stone burden, stent discomfort/pain, heart attack, stroke, pulmonary embolus and the inherent risks with general anesthesia.   For shockwave lithotripsy I described the risks which include arrhythmia, kidney contusion, kidney hemorrhage, need for transfusion, pain, inability to adequately break up stone, inability to pass stone fragments, Steinstrasse, infection associated with obstructing stones, need for alternate surgical procedure, need for repeat shockwave lithotripsy, MI,  CVA, PE and the inherent risks with anesthesia/conscious sedation.   For PCNL I described the risks including positioning injury, pneumothorax, hydrothorax, need for chest tube, inability to clear stone burden, renal laceration, arterial venous fistula or malformation, need for embolization of kidney, loss of kidney or renal function, need for repeat procedure, need for prolonged nephrostomy tube, ureteral avulsion, MI, CVA, PE and the inherent risks of general anesthesia.   - The patient would like to proceed with Left ureteroscopic stone extraction  Wilkie Aye 06/28/2022, 7:34 AM

## 2022-06-28 NOTE — Discharge Instructions (Signed)
PLEASE REMOVE STENT IN 3 DAYS SUNDAY July 01, 2022 BY PULLING ON THE STRING

## 2022-06-28 NOTE — Op Note (Signed)
.  Preoperative diagnosis: Left renal calculi  Postoperative diagnosis: Same  Procedure: 1 cystoscopy 2. Left retrograde pyelography 3.  Intraoperative fluoroscopy, under one hour, with interpretation 4.  Left ureteroscopic stone manipulation with basket extraction 5.  Left 6 x 24 JJ stent placement  Attending: Wilkie Aye  Anesthesia: General  Estimated blood loss: None  Drains: Left 6 x 24 JJ ureteral stent with tether  Specimens: stone for analysis  Antibiotics: ancef  Findings: left upper pole stone. No hydronephrosis. No masses/lesions in the bladder. Ureteral orifices in normal anatomic location.  Indications: Patient is a 56 year old female with a history of left renal stone and who has persistent left flank pain.  After discussing treatment options, she decided proceed with left ureteroscopic stone manipulation.  Procedure in detail: The patient was brought to the operating room and a brief timeout was done to ensure correct patient, correct procedure, correct site.  General anesthesia was administered patient was placed in dorsal lithotomy position.  Her genitalia was then prepped and draped in usual sterile fashion.  A rigid 22 French cystoscope was passed in the urethra and the bladder.  Bladder was inspected free masses or lesions.  the ureteral orifices were in the normal orthotopic locations.  a 6 french ureteral catheter was then instilled into the left ureteral orifice.  a gentle retrograde was obtained and findings noted above.  we then placed a zip wire through the ureteral catheter and advanced up to the renal pelvis.  we then removed the cystoscope and cannulated the left ureteral orifice with a semirigid ureteroscope.  No stone was found in the ureter. Once we reached the UPJ a sensor wire was advanced in to the renal pelvis. We then removed the ureteroscope and advanced am 12/14 x 35cm access sheath up to the renal pelvis. We then used the flexible ureteroscope to  perform nephroscopy. We encountered the stones in the upper pole which were removed with an NGage basket.    once all stones were removed we then removed the access sheath under direct vision and noted no injury to the ureter. We then placed a 6 x 24 double-j ureteral stent over the original zip wire.  We then removed the wire and good coil was noted in the the renal pelvis under fluoroscopy and the bladder under direct vision. the bladder was then drained and this concluded the procedure which was well tolerated by patient.  Complications: None  Condition: Stable, extubated, transferred to PACU  Plan: Patient is to be discharged home as to follow-up in one week. Sh eis to remove her stent in 3 days by pulling the tether

## 2022-06-28 NOTE — Anesthesia Postprocedure Evaluation (Signed)
Anesthesia Post Note  Patient: [name]  Procedure(s) Performed: CYSTOSCOPY WITH RETROGRADE PYELOGRAM, URETEROSCOPY AND STENT PLACEMENT (Left: Ureter)  Patient location during evaluation: Phase II Anesthesia Type: General Level of consciousness: awake and alert and oriented Pain management: pain level controlled Vital Signs Assessment: post-procedure vital signs reviewed and stable Respiratory status: spontaneous breathing, nonlabored ventilation and respiratory function stable Cardiovascular status: blood pressure returned to baseline and stable Postop Assessment: no apparent nausea or vomiting Anesthetic complications: no   No notable events documented.   Last Vitals:  Vitals:   06/28/22 0945 06/28/22 0956  BP: 100/65 101/67  Pulse: 75 81  Resp: 14 14  Temp:  36.7 C  SpO2: 95% 98%    Last Pain:  Vitals:   06/28/22 0956  TempSrc: Oral  PainSc: 7                  Sanai Frick C Breya Cass

## 2022-06-28 NOTE — Transfer of Care (Addendum)
Immediate Anesthesia Transfer of Care Note  Patient: [name]  Procedure(s) Performed: CYSTOSCOPY WITH RETROGRADE PYELOGRAM, URETEROSCOPY AND STENT PLACEMENT (Left: Ureter)  Patient Location: PACU  Anesthesia Type:General  Level of Consciousness: awake, alert  and oriented  Airway & Oxygen Therapy: Patient Spontanous Breathing and Patient connected to nasal cannula oxygen  Post-op Assessment: Report given to RN and Post -op Vital signs reviewed and stable  Post vital signs: Reviewed and stable  Last Vitals:  Vitals Value Taken Time  BP 117/65 06/28/22 0831  Temp 97.9 0834  Pulse 91 06/28/22 0834  Resp 16 06/28/22 0834  SpO2 100 % 06/28/22 0834  Vitals shown include unvalidated device data.  Last Pain:  Vitals:   06/28/22 0701  TempSrc: Oral  PainSc: 0-No pain      Patients Stated Pain Goal: 6 (06/28/22 0701)  Complications: No notable events documented.

## 2022-06-28 NOTE — Anesthesia Preprocedure Evaluation (Addendum)
Anesthesia Evaluation  Patient identified by MRN, date of birth, ID band Patient awake    Reviewed: Allergy & Precautions, H&P , NPO status , Patient's Chart, lab work & pertinent test results, reviewed documented beta blocker date and time   History of Anesthesia Complications Negative for: history of anesthetic complications  Airway Mallampati: II  TM Distance: >3 FB Neck ROM: full    Dental  (+) Dental Advisory Given, Missing   Pulmonary shortness of breath and with exertion, sleep apnea ,    Pulmonary exam normal breath sounds clear to auscultation       Cardiovascular Exercise Tolerance: Good hypertension, Pt. on medications Normal cardiovascular exam Rhythm:regular Rate:Normal     Neuro/Psych  Headaches, PSYCHIATRIC DISORDERS Depression  Neuromuscular disease    GI/Hepatic Neg liver ROS, GERD  Medicated and Controlled,  Endo/Other  diabetes, Well Controlled, Type 2, Oral Hypoglycemic Agents  Renal/GU Renal disease  negative genitourinary   Musculoskeletal  (+) Arthritis , Osteoarthritis,    Abdominal   Peds negative pediatric ROS (+)  Hematology negative hematology ROS (+)   Anesthesia Other Findings  TRANSFORAMINAL LUMBAR INTERBODY FUSION (TLIF) WITH PEDICLE SCREW FIXATION 1 LEVEL 12/05/2021  Lumbar hematoma 12/14/21  Reproductive/Obstetrics negative OB ROS                            Anesthesia Physical  Anesthesia Plan  ASA: 3  Anesthesia Plan: General   Post-op Pain Management: Dilaudid IV   Induction: Intravenous  PONV Risk Score and Plan: 3 and Ondansetron and Metaclopromide  Airway Management Planned: Oral ETT  Additional Equipment:   Intra-op Plan:   Post-operative Plan: Extubation in OR  Informed Consent: I have reviewed the patients History and Physical, chart, labs and discussed the procedure including the risks, benefits and alternatives for the proposed  anesthesia with the patient or authorized representative who has indicated his/her understanding and acceptance.     Dental Advisory Given and Dental advisory given  Plan Discussed with: CRNA and Surgeon  Anesthesia Plan Comments:         Anesthesia Quick Evaluation

## 2022-06-29 ENCOUNTER — Telehealth: Payer: Self-pay | Admitting: Urology

## 2022-06-29 ENCOUNTER — Telehealth: Payer: Self-pay

## 2022-06-29 NOTE — Telephone Encounter (Signed)
Patient had surgery yesterday with Dr. Ronne Binning.  She is requesting a call back from his nurse.   She is experiencing pain and needs advice.

## 2022-06-29 NOTE — Telephone Encounter (Signed)
Patient called in explaining she was experiencing pain after surgery. Patient stated she was taking her pain medication and antibiotic as prescribe. Verbal from Dr. Ronne Binning and he advise that the patient should pull stent out slow and her pain should be reduce. Patient made aware and she voice understanding.

## 2022-07-02 ENCOUNTER — Emergency Department (HOSPITAL_COMMUNITY)
Admission: EM | Admit: 2022-07-02 | Discharge: 2022-07-02 | Disposition: A | Discharge status: Home / Self Care | Payer: No Typology Code available for payment source | Attending: Emergency Medicine | Admitting: Emergency Medicine

## 2022-07-02 ENCOUNTER — Encounter (HOSPITAL_COMMUNITY): Payer: Self-pay

## 2022-07-02 ENCOUNTER — Other Ambulatory Visit: Payer: Self-pay

## 2022-07-02 ENCOUNTER — Ambulatory Visit (INDEPENDENT_AMBULATORY_CARE_PROVIDER_SITE_OTHER): Payer: No Typology Code available for payment source | Admitting: Urology

## 2022-07-02 ENCOUNTER — Encounter: Payer: Self-pay | Admitting: Urology

## 2022-07-02 ENCOUNTER — Emergency Department (HOSPITAL_COMMUNITY): Payer: No Typology Code available for payment source

## 2022-07-02 ENCOUNTER — Ambulatory Visit: Payer: No Typology Code available for payment source | Admitting: Urology

## 2022-07-02 VITALS — BP 109/60 | HR 76

## 2022-07-02 DIAGNOSIS — R319 Hematuria, unspecified: Secondary | ICD-10-CM | POA: Diagnosis not present

## 2022-07-02 DIAGNOSIS — Z79899 Other long term (current) drug therapy: Secondary | ICD-10-CM | POA: Insufficient documentation

## 2022-07-02 DIAGNOSIS — R109 Unspecified abdominal pain: Secondary | ICD-10-CM

## 2022-07-02 DIAGNOSIS — N200 Calculus of kidney: Secondary | ICD-10-CM

## 2022-07-02 LAB — CBC WITH DIFFERENTIAL/PLATELET
Abs Immature Granulocytes: 0.02 10*3/uL (ref 0.00–0.07)
Basophils Absolute: 0.1 10*3/uL (ref 0.0–0.1)
Basophils Relative: 1 %
Eosinophils Absolute: 0.2 10*3/uL (ref 0.0–0.5)
Eosinophils Relative: 2 %
HCT: 43.9 % (ref 36.0–46.0)
Hemoglobin: 14.2 g/dL (ref 12.0–15.0)
Immature Granulocytes: 0 %
Lymphocytes Relative: 24 %
Lymphs Abs: 1.9 10*3/uL (ref 0.7–4.0)
MCH: 27.6 pg (ref 26.0–34.0)
MCHC: 32.3 g/dL (ref 30.0–36.0)
MCV: 85.4 fL (ref 80.0–100.0)
Monocytes Absolute: 0.5 10*3/uL (ref 0.1–1.0)
Monocytes Relative: 6 %
Neutro Abs: 5.2 10*3/uL (ref 1.7–7.7)
Neutrophils Relative %: 67 %
Platelets: 302 10*3/uL (ref 150–400)
RBC: 5.14 MIL/uL — ABNORMAL HIGH (ref 3.87–5.11)
RDW: 13.4 % (ref 11.5–15.5)
WBC: 7.8 10*3/uL (ref 4.0–10.5)
nRBC: 0 % (ref 0.0–0.2)

## 2022-07-02 LAB — BASIC METABOLIC PANEL
Anion gap: 10 (ref 5–15)
BUN: 12 mg/dL (ref 6–20)
CO2: 28 mmol/L (ref 22–32)
Calcium: 8.8 mg/dL — ABNORMAL LOW (ref 8.9–10.3)
Chloride: 97 mmol/L — ABNORMAL LOW (ref 98–111)
Creatinine, Ser: 0.7 mg/dL (ref 0.44–1.00)
GFR, Estimated: >60 mL/min (ref >60)
Glucose, Bld: 180 mg/dL — ABNORMAL HIGH (ref 70–99)
Potassium: 3.3 mmol/L — ABNORMAL LOW (ref 3.5–5.1)
Sodium: 135 mmol/L (ref 135–145)

## 2022-07-02 LAB — URINALYSIS, ROUTINE W REFLEX MICROSCOPIC
Bacteria, UA: NONE SEEN
Bilirubin Urine: NEGATIVE
Glucose, UA: >=500 mg/dL — AB
Ketones, ur: NEGATIVE mg/dL
Leukocytes,Ua: NEGATIVE
Nitrite: NEGATIVE
Protein, ur: NEGATIVE mg/dL
Specific Gravity, Urine: 1.003 — ABNORMAL LOW (ref 1.005–1.030)
pH: 6 (ref 5.0–8.0)

## 2022-07-02 MED ORDER — ONDANSETRON HCL 4 MG/2ML IJ SOLN
4.0000 mg | Freq: Once | INTRAMUSCULAR | Status: AC
Start: 2022-07-02 — End: 2022-07-02
  Administered 2022-07-02: 4 mg via INTRAVENOUS
  Filled 2022-07-02: qty 2

## 2022-07-02 MED ORDER — CYCLOBENZAPRINE HCL 10 MG PO TABS: 10.0000 mg | ORAL_TABLET | Freq: Three times a day (TID) | ORAL | 0 refills | Status: DC | PRN

## 2022-07-02 MED ORDER — SODIUM CHLORIDE 0.9 % IV SOLN
INTRAVENOUS | Status: DC
  Administered 2022-07-02: 125 mL/h via INTRAVENOUS

## 2022-07-02 MED ORDER — FENTANYL CITRATE (PF) 100 MCG/2ML IJ SOLN
100.0000 ug | Freq: Once | INTRAMUSCULAR | Status: AC
  Administered 2022-07-02: 100 ug via INTRAVENOUS
  Filled 2022-07-02: qty 2

## 2022-07-02 MED ORDER — KETOROLAC TROMETHAMINE 30 MG/ML IJ SOLN
15.0000 mg | Freq: Once | INTRAMUSCULAR | Status: AC
Start: 2022-07-02 — End: 2022-07-02
  Administered 2022-07-02: 15 mg via INTRAVENOUS
  Filled 2022-07-02: qty 1

## 2022-07-02 NOTE — ED Notes (Signed)
MD notified re: pts request for pain and nausea meds

## 2022-07-02 NOTE — Patient Instructions (Signed)
Acute Back Pain, Adult Acute back pain is sudden and usually short-lived. It is often caused by an injury to the muscles and tissues in the back. The injury may result from: A muscle, tendon, or ligament getting overstretched or torn. Ligaments are tissues that connect bones to each other. Lifting something improperly can cause a back strain. Wear and tear (degeneration) of the spinal disks. Spinal disks are circular tissue that provide cushioning between the bones of the spine (vertebrae). Twisting motions, such as while playing sports or doing yard work. A hit to the back. Arthritis. You may have a physical exam, lab tests, and imaging tests to find the cause of your pain. Acute back pain usually goes away with rest and home care. Follow these instructions at home: Managing pain, stiffness, and swelling Take over-the-counter and prescription medicines only as told by your health care provider. Treatment may include medicines for pain and inflammation that are taken by mouth or applied to the skin, or muscle relaxants. Your health care provider may recommend applying ice during the first 24-48 hours after your pain starts. To do this: Put ice in a plastic bag. Place a towel between your skin and the bag. Leave the ice on for 20 minutes, 2-3 times a day. Remove the ice if your skin turns bright red. This is very important. If you cannot feel pain, heat, or cold, you have a greater risk of damage to the area. If directed, apply heat to the affected area as often as told by your health care provider. Use the heat source that your health care provider recommends, such as a moist heat pack or a heating pad. Place a towel between your skin and the heat source. Leave the heat on for 20-30 minutes. Remove the heat if your skin turns bright red. This is especially important if you are unable to feel pain, heat, or cold. You have a greater risk of getting burned. Activity  Do not stay in bed. Staying in  bed for more than 1-2 days can delay your recovery. Sit up and stand up straight. Avoid leaning forward when you sit or hunching over when you stand. If you work at a desk, sit close to it so you do not need to lean over. Keep your chin tucked in. Keep your neck drawn back, and keep your elbows bent at a 90-degree angle (right angle). Sit high and close to the steering wheel when you drive. Add lower back (lumbar) support to your car seat, if needed. Take short walks on even surfaces as soon as you are able. Try to increase the length of time you walk each day. Do not sit, drive, or stand in one place for more than 30 minutes at a time. Sitting or standing for long periods of time can put stress on your back. Do not drive or use heavy machinery while taking prescription pain medicine. Use proper lifting techniques. When you bend and lift, use positions that put less stress on your back: Bend your knees. Keep the load close to your body. Avoid twisting. Exercise regularly as told by your health care provider. Exercising helps your back heal faster and helps prevent back injuries by keeping muscles strong and flexible. Work with a physical therapist to make a safe exercise program, as recommended by your health care provider. Do any exercises as told by your physical therapist. Lifestyle Maintain a healthy weight. Extra weight puts stress on your back and makes it difficult to have good   posture. Avoid activities or situations that make you feel anxious or stressed. Stress and anxiety increase muscle tension and can make back pain worse. Learn ways to manage anxiety and stress, such as through exercise. General instructions Sleep on a firm mattress in a comfortable position. Try lying on your side with your knees slightly bent. If you lie on your back, put a pillow under your knees. Keep your head and neck in a straight line with your spine (neutral position) when using electronic equipment like  smartphones or pads. To do this: Raise your smartphone or pad to look at it instead of bending your head or neck to look down. Put the smartphone or pad at the level of your face while looking at the screen. Follow your treatment plan as told by your health care provider. This may include: Cognitive or behavioral therapy. Acupuncture or massage therapy. Meditation or yoga. Contact a health care provider if: You have pain that is not relieved with rest or medicine. You have increasing pain going down into your legs or buttocks. Your pain does not improve after 2 weeks. You have pain at night. You lose weight without trying. You have a fever or chills. You develop nausea or vomiting. You develop abdominal pain. Get help right away if: You develop new bowel or bladder control problems. You have unusual weakness or numbness in your arms or legs. You feel faint. These symptoms may represent a serious problem that is an emergency. Do not wait to see if the symptoms will go away. Get medical help right away. Call your local emergency services (911 in the [place] Do not drive yourself to the hospital. Summary Acute back pain is sudden and usually short-lived. Use proper lifting techniques. When you bend and lift, use positions that put less stress on your back. Take over-the-counter and prescription medicines only as told by your health care provider, and apply heat or ice as told. This information is not intended to replace advice given to you by your health care provider. Make sure you discuss any questions you have with your health care provider. Document Revised: 02/17/2021 Document Reviewed: 02/17/2021 Elsevier Patient Education  2023 [employer]  

## 2022-07-02 NOTE — Discharge Instructions (Signed)
See Dr. Ronne Binning, today as scheduled.

## 2022-07-02 NOTE — Progress Notes (Signed)
07/02/2022 3:11 PM   Evon Slack 1966/07/17 [id number]  Referring provider: Lindell Spar, MD [address] [place],  [place] [address]  Followup nephrolithiasis   HPI: [name] is a 56yo here for followup for nephrolithiasis. She presented to the ER after stent removal this morning and underwent CT which showed no hydronephrosis.She has mild left flank pain since removing the stent. She has gross hematuria that is improving. She denies any worsening LUTS.    PMH: Past Medical History:  Diagnosis Date   Diabetes mellitus    insulin pump 2013   Dysphagia 03/09/2020   Epidural hematoma ([place]) 12/14/2021   Hypercholesteremia    Hypertension    Incarcerated umbilical hernia 3/76/2831   Kidney stone    Kidney stones 08/31/2008   Qualifier: Diagnosis of  By: Jonna Munro MD, Cornelius     Neuropathy    Obesity    Obstructive sleep apnea    Palpitations    Shortness of breath     Surgical History: Past Surgical History:  Procedure Laterality Date   BIOPSY  06/23/2020   Procedure: BIOPSY;  Surgeon: Daneil Dolin, MD;  [facility];  Service: Endoscopy;;   CARDIAC CATHETERIZATION  12/11/2012   normal coronary arteries   CESAREAN SECTION  1994;2000   [facility]   COLONOSCOPY WITH PROPOFOL N/A 06/23/2020   large amount of stool in entire colon, prep inadequate.    CYSTOSCOPY W/ URETERAL STENT PLACEMENT  05/08/2012   Procedure: CYSTOSCOPY WITH RETROGRADE PYELOGRAM/URETERAL STENT PLACEMENT;  Surgeon: Marissa Nestle, MD;  Location: [facility];  Service: Urology;  Laterality: Right;   CYSTOSCOPY WITH RETROGRADE PYELOGRAM, URETEROSCOPY AND STENT PLACEMENT Right 05/04/2020   Procedure: CYSTOSCOPY WITH RIGHT RETROGRADE PYELOGRAM, RIGHT URETEROSCOPY AND RIGHT URETERAL STENT EXCHANGE;  Surgeon: Cleon Gustin, MD;  Location: [facility];  Service: Urology;  Laterality: Right;   CYSTOSCOPY WITH RETROGRADE PYELOGRAM, URETEROSCOPY AND STENT PLACEMENT Left  08/08/2020   Procedure: CYSTOSCOPY WITH RETROGRADE PYELOGRAM, URETEROSCOPY WITH LASER  AND STENT PLACEMENT;  Surgeon: Cleon Gustin, MD;  Location: [facility];  Service: Urology;  Laterality: Left;   CYSTOSCOPY WITH RETROGRADE PYELOGRAM, URETEROSCOPY AND STENT PLACEMENT Right 11/08/2020   Procedure: CYSTOSCOPY WITH RIGHT RETROGRADE PYELOGRAM, RIGHT URETEROSCOPY AND STENT PLACEMENT;  Surgeon: Cleon Gustin, MD;  [facility]: [facility];  Service: Urology;  Laterality: Right;   CYSTOSCOPY WITH RETROGRADE PYELOGRAM, URETEROSCOPY AND STENT PLACEMENT Left 12/27/2020   Procedure: CYSTOSCOPY WITH LEFT RETROGRADE PYELOGRAM, LEFT URETEROSCOPY AND LEFT URETERAL STENT PLACEMENT;  Surgeon: Cleon Gustin, MD;  [facility];  Service: Urology;  Laterality: Left;   CYSTOSCOPY WITH RETROGRADE PYELOGRAM, URETEROSCOPY AND STENT PLACEMENT Bilateral 03/16/2021   Procedure: CYSTOSCOPY WITH BILATERAL  RETROGRADE PYELOGRAM, BILATERAL URETEROSCOPY AND STENT PLACEMENT ON THE LEFT;  Surgeon: Cleon Gustin, MD;  [facility];  Service: Urology;  Laterality: Bilateral;   CYSTOSCOPY WITH RETROGRADE PYELOGRAM, URETEROSCOPY AND STENT PLACEMENT Left 06/28/2022   Procedure: CYSTOSCOPY WITH RETROGRADE PYELOGRAM, URETEROSCOPY AND STENT PLACEMENT;  Surgeon: Cleon Gustin, MD;  [facility];  Service: Urology;  Laterality: Left;   CYSTOSCOPY WITH STENT PLACEMENT Right 02/25/2020   Procedure: CYSTOSCOPY WITH RIGHT RETROGRADE RIGHT STENT PLACEMENT;  Surgeon: Festus Aloe, MD;  Location[facility];  Service: Urology;  Laterality: Right;   ESOPHAGOGASTRODUODENOSCOPY (EGD) WITH PROPOFOL N/A 06/23/2020   Normal esophagus, multiple localized erosions were found in gastric antrum s/p biopsy, normal duodenum. Empiric dilation.Reactive gastropathy with erosions, negative  H.pylori, no dysplasia.     EXTRACORPOREAL SHOCK WAVE LITHOTRIPSY Right 10/11/2020   Procedure: EXTRACORPOREAL SHOCK WAVE LITHOTRIPSY (ESWL);  Surgeon:  Cleon Gustin, MD;  Location: [facility];  Service: Urology;  Laterality: Right;   FOOT SURGERY Right March, 2013   [facility]-removal of bone spur   HOLMIUM LASER APPLICATION  04/27/8415   Procedure: HOLMIUM LASER LITHOTRIPSY RIGHT URETERAL CALCULUS;  Surgeon: Cleon Gustin, MD;  Location: [facility];  Service: Urology;;   HYSTEROSCOPY WITH THERMACHOICE  04/25/2012   Procedure: HYSTEROSCOPY WITH THERMACHOICE;  Surgeon: Florian Buff, MD;  Location: [facility];  Service: Gynecology;  Laterality: N/A;  total therapy time= 11 minutes 42 seconds; 34 ml D5w  in and 34 ml D5w out; temp =87 degrees F   LEFT HEART CATHETERIZATION WITH CORONARY ANGIOGRAM N/A 12/11/2012   Procedure: LEFT HEART CATHETERIZATION WITH CORONARY ANGIOGRAM;  Surgeon: Lorretta Harp, MD;  Location: [facility] CATH [facility];  Service: Cardiovascular;  Laterality: N/A;   LUMBAR WOUND DEBRIDEMENT N/A 12/14/2021   Procedure: LUMBAR HEMATOMA EVACUATION;  Surgeon: Judith Part, MD;  Location: [place];  Service: Neurosurgery;  Laterality: N/A;   MALONEY DILATION N/A 06/23/2020   Procedure: Venia Minks DILATION;  Surgeon: Daneil Dolin, MD;  [facility];  Service: Endoscopy;  Laterality: N/A;   Sinus sergery  1988   [place]   STONE EXTRACTION WITH BASKET Right 11/08/2020   Procedure: STONE EXTRACTION WITH BASKET;  Surgeon: Cleon Gustin, MD;  Location: [facility];  Service: Urology;  Laterality: Right;   TRANSFORAMINAL LUMBAR INTERBODY FUSION (TLIF) WITH PEDICLE SCREW FIXATION 1 LEVEL N/A 12/05/2021   Procedure: Open Lumbar five-Sacral one Laminectomy/Transforaminal Lumbar Interbody Fusion/Posterolateral instrumented fusion;  Surgeon: Judith Part, MD;  Location: [place];  Service: Neurosurgery;  Laterality: N/A;  3C   UMBILICAL HERNIA REPAIR N/A 03/25/2014   Procedure: UMBILICAL HERNIA REPAIR;  Surgeon: Scherry Ran, MD;  Location: [facility];  Service: General;  Laterality: N/A;   uterine ablation      Home  Medications:  Allergies as of 07/02/2022       Reactions   Ciprofloxacin Hives, Swelling   Penicillins Anaphylaxis, Rash   Codeine Other (See Comments)   had hallicunations   Morphine Nausea And Vomiting, Other (See Comments)   recieved in ED due to [facility] and had multple doses - gave visual hallucinations and vomitting.   Sulfonamide Derivatives Other (See Comments)   REACTION: Unsure - childhood allergy   Vancomycin Itching        Medication List        Accurate as of July 02, 2022  3:11 PM. If you have any questions, ask your nurse or doctor.          albuterol 108 (90 Base) MCG/ACT inhaler Commonly known as: VENTOLIN HFA Inhale 2 puffs into the lungs every 6 (six) hours as needed for wheezing or shortness of breath.   blood glucose meter kit and supplies Use up to four times daily as directed.   buPROPion 150 MG 24 hr tablet Commonly known as: WELLBUTRIN XL Take 150 mg by mouth daily.   cyclobenzaprine 10 MG tablet Commonly known as: FLEXERIL Take 1 tablet (10 mg total) by mouth 3 (three) times daily as needed for muscle spasms.   freestyle lancets Use to test 4 times daily   furosemide 20 MG tablet Commonly known as: LASIX Take 20 mg by mouth 2 (two) times daily as needed for fluid or edema.  gabapentin 600 MG tablet Commonly known as: NEURONTIN Take 1 tablet (600 mg total) by mouth 3 (three) times daily.   glucose blood test strip Test up to 4 times a day   hydrOXYzine 50 MG capsule Commonly known as: VISTARIL TAKE 1 CAPSULE(50 MG) BY MOUTH AT BEDTIME   lisinopril-hydrochlorothiazide 20-12.5 MG tablet Commonly known as: ZESTORETIC Take 1 tablet by mouth daily.   nitrofurantoin (macrocrystal-monohydrate) 100 MG capsule Commonly known as: Macrobid Take 1 capsule (100 mg total) by mouth 2 (two) times daily.   omeprazole 20 MG capsule Commonly known as: PRILOSEC Take 1 capsule (20 mg total) by mouth daily.   ondansetron 4 MG  tablet Commonly known as: Zofran Take 1 tablet (4 mg total) by mouth every 8 (eight) hours as needed for nausea or vomiting.   oxybutynin 5 MG 24 hr tablet Commonly known as: DITROPAN-XL Take 5 mg by mouth at bedtime.   oxyCODONE 5 MG immediate release tablet Commonly known as: Oxy IR/ROXICODONE Take 1 tablet (5 mg total) by mouth every 4 (four) hours as needed (pain).   Potassium Citrate 15 MEQ (1620 MG) Tbcr Commonly known as: Urocit-K 15 Take 1 tablet by mouth in the morning and at bedtime.   simvastatin 40 MG tablet Commonly known as: ZOCOR Take 1 tablet (40 mg total) by mouth every evening for cholesterol   Synjardy 12.5-500 MG Tabs Generic drug: Empagliflozin-metFORMIN HCl Take 1 tablet by mouth 2 (two) times daily. What changed: when to take this   Trulicity 3 WS/5.6CL Sopn Generic drug: Dulaglutide Inject 3 mg as directed once a week.        Allergies:  Allergies  Allergen Reactions   Ciprofloxacin Hives and Swelling   Penicillins Anaphylaxis and Rash   Codeine Other (See Comments)    had hallicunations   Morphine Nausea And Vomiting and Other (See Comments)    recieved in ED due to [facility] and had multple doses - gave visual hallucinations and vomitting.   Sulfonamide Derivatives Other (See Comments)    REACTION: Unsure - childhood allergy   Vancomycin Itching    Family History: Family History  Problem Relation Age of Onset   Hypertension Mother    Cancer Mother    Diabetes Mother    Stroke Mother    Diabetes Father    Depression Paternal Grandmother    Depression Paternal Grandfather    Cancer Paternal Uncle    Heart disease Other    Cancer Other    Diabetes Other    Achalasia Other    Anesthesia problems Neg Hx    Hypotension Neg Hx    Malignant hyperthermia Neg Hx    Pseudochol deficiency Neg Hx    Colon cancer Neg Hx    Colon polyps Neg Hx     Social History:  reports that she has never smoked. She has never used smokeless  tobacco. She reports that she does not drink alcohol and does not use drugs.  ROS: All other review of systems were reviewed and are negative except what is noted above in HPI  Physical Exam: BP 109/60   Pulse 76   LMP 07/16/2012   Constitutional:  Alert and oriented, No acute distress. HEENT:  AT, moist mucus membranes.  Trachea midline, no masses. Cardiovascular: No clubbing, cyanosis, or edema. Respiratory: Normal respiratory effort, no increased work of breathing. GI: Abdomen is soft, nontender, nondistended, no abdominal masses GU: No CVA tenderness.  Lymph: No cervical or inguinal lymphadenopathy. Skin: No rashes, bruises  or suspicious lesions. Neurologic: Grossly intact, no focal deficits, moving all 4 extremities. Psychiatric: Normal mood and affect.  Laboratory Data: Lab Results  Component Value Date   WBC 7.8 07/02/2022   HGB 14.2 07/02/2022   HCT 43.9 07/02/2022   MCV 85.4 07/02/2022   PLT 302 07/02/2022    Lab Results  Component Value Date   CREATININE 0.70 07/02/2022    No results found for: "PSA"  No results found for: "TESTOSTERONE"  Lab Results  Component Value Date   HGBA1C 6.5 06/07/2022    Urinalysis    Component Value Date/Time   COLORURINE STRAW (A) 07/02/2022 0717   APPEARANCEUR CLEAR 07/02/2022 0717   APPEARANCEUR Clear 04/16/2022 1533   LABSPEC 1.003 (L) 07/02/2022 0717   PHURINE 6.0 07/02/2022 0717   GLUCOSEU >=500 (A) 07/02/2022 0717   HGBUR LARGE (A) 07/02/2022 0717   HGBUR moderate 08/31/2008 0908   BILIRUBINUR NEGATIVE 07/02/2022 0717   BILIRUBINUR Negative 04/16/2022 1533   KETONESUR NEGATIVE 07/02/2022 0717   PROTEINUR NEGATIVE 07/02/2022 0717   UROBILINOGEN 0.2 12/14/2020 0835   UROBILINOGEN 0.2 09/21/2014 1055   NITRITE NEGATIVE 07/02/2022 0717   LEUKOCYTESUR NEGATIVE 07/02/2022 0717    Lab Results  Component Value Date   LABMICR Comment 04/16/2022   WBCUA 6-10 (A) 01/05/2022   LABEPIT 0-10 01/05/2022   BACTERIA  NONE SEEN 07/02/2022    Pertinent Imaging: CT 07/02/2022: Images removed and discussed with the patient  Results for orders placed during the hospital encounter of 05/17/22  DG Abdomen 1 View  Narrative CLINICAL DATA:  Left-sided abdominal and flank pain. History of kidney stones.  EXAM: ABDOMEN - 1 VIEW  COMPARISON:  Ultrasound 04/12/2022. CT 02/01/2021  FINDINGS: Bowel gas pattern is normal. Cholecystectomy clips in place. Lower spinal fusion. No visible urinary tract calculi.  IMPRESSION: No visible urinary tract calculi. Sensitivity for small stones would be limited given patient's size and bowel contents.   Electronically Signed By: Nelson Chimes M.D. On: 05/17/2022 08:30  No results found for this or any previous visit.  No results found for this or any previous visit.  No results found for this or any previous visit.  Results for orders placed during the hospital encounter of 05/17/22  US Renal  Narrative CLINICAL DATA:  Left flank pain  EXAM: RENAL / URINARY TRACT ULTRASOUND COMPLETE  COMPARISON:  04/12/2022  FINDINGS: Right Kidney:  Renal measurements: 11.3 x 5.5 x 7.1 cm = volume: 231 mL. Echogenicity within normal limits. No mass or hydronephrosis visualized. Shadowing calculus in the inferior pole.  Left Kidney:  Renal measurements: 11.7 x 5.8 x 5.0 cm = volume: 178 mL. Echogenicity within normal limits. No mass or hydronephrosis visualized. Shadowing calculi in the superior pole.  Bladder:  Appears normal for degree of bladder distention.  Other:  None.  IMPRESSION: 1. No acute abnormality. No hydronephrosis. 2. Bilateral nonobstructive nephrolithiasis.   Electronically Signed By: Delanna Ahmadi M.D. On: 05/17/2022 09:23  No results found for this or any previous visit.  No results found for this or any previous visit.  Results for orders placed during the hospital encounter of 07/02/22  CT Renal Stone  Study  Narrative CLINICAL DATA:  Left flank pain, hematuria  EXAM: CT ABDOMEN AND PELVIS WITHOUT CONTRAST  TECHNIQUE: Multidetector CT imaging of the abdomen and pelvis was performed following the standard protocol without IV contrast.  RADIATION DOSE REDUCTION: This exam was performed according to the departmental dose-optimization program which includes automated exposure control, adjustment  of the mA and/or kV according to patient size and/or use of iterative reconstruction technique.  COMPARISON:  MR 12/14/2021, CT 02/01/2021 and previous  FINDINGS: Lower chest: No pleural or pericardial effusion. Minimal dependent atelectasis or scarring posteriorly in the visualized lung bases.  Hepatobiliary: No focal liver abnormality is seen. Status post cholecystectomy. No biliary dilatation.  Pancreas: Unremarkable. No pancreatic ductal dilatation or surrounding inflammatory changes.  Spleen: Normal in size without focal abnormality.  Adrenals/Urinary Tract: No adrenal mass. Bilateral urolithiasis, largest stone cluster 9 mm right lower pole, on the left 5 mm mid collecting system. No hydronephrosis or ureteral calculus. Left ureter is mildly patulous as before, without adjacent inflammatory change or calculus. Urinary bladder is physiologically distended.  Stomach/Bowel: Stomach is partially distended, unremarkable. Small bowel decompressed. Normal appendix. The colon is incompletely distended, unremarkable.  Vascular/Lymphatic: Minimal calcified aortic plaque without aneurysm. No abdominal or pelvic adenopathy.  Reproductive: Uterus and bilateral adnexa are unremarkable.  Other: No ascites.  No free air.  Musculoskeletal: Post instrumented PLIF L5-S1 without evident complication, stable grade 1 anterolisthesis. Mild lumbar levoscoliosis with multilevel spondylitic change. No fracture or worrisome bone lesion.  IMPRESSION: 1. Bilateral nonobstructive urolithiasis 2.   Aortic Atherosclerosis (ICD10-170.0). 3. Stable postop changes L5-S1.   Electronically Signed By: Lucrezia Europe M.D. On: 07/02/2022 08:35   Assessment & Plan:    1. Kidney stones -RTC 4-6 weeks with a renal US - Urinalysis, Routine w reflex microscopic  2. Back pain -flexeril 34m TID prn   No follow-ups on file.  PNicolette Bang MD  CSt Louis-John Cochran Va Medical CenterUrology RShubuta

## 2022-07-02 NOTE — ED Triage Notes (Signed)
Patient had laparoscopy kidney stone removal on Thursday and has not had any relief from pain. Patient states increased N/V with urinary issues per patient.

## 2022-07-02 NOTE — ED Notes (Signed)
Pt states, "She is unable to void at this time."

## 2022-07-02 NOTE — ED Provider Notes (Signed)
[facility] EMERGENCY DEPARTMENT Provider Note   CSN: [id number] Arrival date & time: 07/02/22  0630     History  Chief Complaint  Patient presents with   Post-op Problem    [name] is a 56 y.o. female.  HPI She presents for evaluation of left flank and left abdominal pain for several days, following recent urologic procedure including cystoscopy and left ureteral stenting for kidney stone.  She removed the stent, 3 days ago.  She has ongoing pain despite using oxycodone.  She feels she is urinating less than usual.  She denies documented fever but has felt some chills.  She has had nausea and vomiting.  She denies diarrhea.  She states the pain also radiates to the left leg.  She has a history of back surgery for degenerative changes.  She is ambulatory.  She came by private vehicle.    Home Medications Prior to Admission medications   Medication Sig Start Date End Date Taking? Authorizing Provider  albuterol (VENTOLIN HFA) 108 (90 Base) MCG/ACT inhaler Inhale 2 puffs into the lungs every 6 (six) hours as needed for wheezing or shortness of breath.  08/27/19  Yes [provider]  buPROPion (WELLBUTRIN XL) 150 MG 24 hr tablet Take 150 mg by mouth daily.   Yes [provider]  Dulaglutide (TRULICITY) 3 FW/2.6VZ SOPN Inject 3 mg as directed once a week. 06/07/22  Yes Nida, Marella Chimes, MD  Empagliflozin-metFORMIN HCl (SYNJARDY) 12.5-500 MG TABS Take 1 tablet by mouth 2 (two) times daily. Patient taking differently: Take 1 tablet by mouth daily. 04/19/22  Yes Nida, Marella Chimes, MD  furosemide (LASIX) 20 MG tablet Take 20 mg by mouth 2 (two) times daily as needed for fluid or edema.   Yes [provider]  gabapentin (NEURONTIN) 600 MG tablet Take 1 tablet (600 mg total) by mouth 3 (three) times daily. 06/15/22  Yes Lindell Spar, MD  hydrOXYzine (VISTARIL) 50 MG capsule TAKE 1 CAPSULE(50 MG) BY MOUTH AT BEDTIME 06/04/22  Yes Lindell Spar, MD   lisinopril-hydrochlorothiazide (ZESTORETIC) 20-12.5 MG tablet Take 1 tablet by mouth daily. 02/08/22  Yes Lindell Spar, MD  omeprazole (PRILOSEC) 20 MG capsule Take 1 capsule (20 mg total) by mouth daily. 04/12/22  Yes Lindell Spar, MD  ondansetron (ZOFRAN) 4 MG tablet Take 1 tablet (4 mg total) by mouth every 8 (eight) hours as needed for nausea or vomiting. 03/12/22  Yes Lindell Spar, MD  oxybutynin (DITROPAN-XL) 5 MG 24 hr tablet Take 5 mg by mouth at bedtime.   Yes [provider]  oxyCODONE (OXY IR/ROXICODONE) 5 MG immediate release tablet Take 1 tablet (5 mg total) by mouth every 4 (four) hours as needed (pain). 06/28/22  Yes McKenzie, Candee Furbish, MD  Potassium Citrate (UROCIT-K 15) 15 MEQ (1620 MG) TBCR Take 1 tablet by mouth in the morning and at bedtime. 01/05/22  Yes McKenzie, Candee Furbish, MD  simvastatin (ZOCOR) 40 MG tablet Take 1 tablet (40 mg total) by mouth every evening for cholesterol 02/08/22  Yes Lindell Spar, MD  blood glucose meter kit and supplies Use up to four times daily as directed. 08/31/21   [name], NP  cyclobenzaprine (FLEXERIL) 10 MG tablet Take 1 tablet (10 mg total) by mouth 3 (three) times daily as needed for muscle spasms. 07/02/22   McKenzie, Candee Furbish, MD  glucose blood test strip Test up to 4 times a day 10/02/21   Lindell Spar,  MD  Lancets (FREESTYLE) lancets Use to test 4 times daily 08/31/21   [name], NP  nitrofurantoin, macrocrystal-monohydrate, (MACROBID) 100 MG capsule Take 1 capsule (100 mg total) by mouth 2 (two) times daily. 06/28/22   McKenzie, Candee Furbish, MD      Allergies    Ciprofloxacin, Penicillins, Codeine, Morphine, Sulfonamide derivatives, and Vancomycin    Review of Systems   Review of Systems  Physical Exam Updated Vital Signs BP 109/61   Pulse 73   Temp 98.1 F (36.7 C) (Oral)   Resp 16   Ht 5' (1.524 m)   Wt 82 kg   LMP 07/16/2012   SpO2 100%   BMI 35.31 kg/m  Physical Exam Vitals and nursing note  reviewed.  Constitutional:      General: She is not in acute distress.    Appearance: She is well-developed. She is not ill-appearing, toxic-appearing or diaphoretic.  HENT:     Head: Normocephalic and atraumatic.     Right Ear: External ear normal.     Left Ear: External ear normal.  Eyes:     Conjunctiva/sclera: Conjunctivae normal.     Pupils: Pupils are equal, round, and reactive to light.  Neck:     Trachea: Phonation normal.  Cardiovascular:     Rate and Rhythm: Normal rate.  Pulmonary:     Effort: Pulmonary effort is normal.  Abdominal:     General: There is no distension.     Palpations: Abdomen is soft.     Tenderness: There is no abdominal tenderness.  Genitourinary:    Comments: Left costovertebral angle tenderness with percussion.  No tenderness of the lumbar region. Musculoskeletal:        General: No swelling, tenderness or deformity. Normal range of motion.     Cervical back: Normal range of motion and neck supple.  Skin:    General: Skin is warm and dry.  Neurological:     Mental Status: She is alert and oriented to person, place, and time.     Cranial Nerves: No cranial nerve deficit.     Sensory: No sensory deficit.     Motor: No abnormal muscle tone.     Coordination: Coordination normal.  Psychiatric:        Mood and Affect: Mood normal.        Behavior: Behavior normal.        Thought Content: Thought content normal.        Judgment: Judgment normal.     ED Results / Procedures / Treatments   Labs (all labs ordered are listed, but only abnormal results are displayed) Labs Reviewed  URINALYSIS, ROUTINE W REFLEX MICROSCOPIC - Abnormal; Notable for the following components:      Result Value   Color, Urine STRAW (*)    Specific Gravity, Urine 1.003 (*)    Glucose, UA >=500 (*)    Hgb urine dipstick LARGE (*)    All other components within normal limits  BASIC METABOLIC PANEL - Abnormal; Notable for the following components:   Potassium 3.3 (*)     Chloride 97 (*)    Glucose, Bld 180 (*)    Calcium 8.8 (*)    All other components within normal limits  CBC WITH DIFFERENTIAL/PLATELET - Abnormal; Notable for the following components:   RBC 5.14 (*)    All other components within normal limits    EKG None  Radiology CT L-SPINE NO CHARGE  Result Date: 07/02/2022 CLINICAL DATA:  Left flank  pain, hematuria post cystoscopy EXAM: CT LUMBAR SPINE WITHOUT CONTRAST TECHNIQUE: Multidetector CT imaging of the lumbar spine was performed without intravenous contrast administration. Multiplanar CT image reconstructions were also generated. RADIATION DOSE REDUCTION: This exam was performed according to the departmental dose-optimization program which includes automated exposure control, adjustment of the mA and/or kV according to patient size and/or use of iterative reconstruction technique. COMPARISON:  MR 12/14/2021, CT 02/01/2021 FINDINGS: Segmentation: 5 lumbar vertebral segments assigned L1-L5 as before Alignment: Stable grade 1 anterolisthesis L5-S1. Vertebrae: No fracture or other focal lesion Paraspinal and other soft tissues: Bilateral nonobstructive urolithiasis without hydronephrosis. Disc levels: T11-L4: Minimal spondylitic changes without compressive pathology. L4-5: Bilateral facet DJD. Narrowing of the interspace with posterior partially calcified protrusion as before. L5-S1: Bilateral pedicle screws with vertical interconnecting hardware bilaterally, intact without surrounding lucency. The left L5 screw encroaches on the left L5 nerve root at the level of the pedicle as before. Stable grade 1 anterolisthesis. IMPRESSION: 1. No acute findings 2. Stable spondylitic changes L4-5 3. Stable appearance of L5-S1 fusion, as above. Electronically Signed   By: Lucrezia Europe M.D.   On: 07/02/2022 08:43   CT Renal Stone Study  Result Date: 07/02/2022 CLINICAL DATA:  Left flank pain, hematuria EXAM: CT ABDOMEN AND PELVIS WITHOUT CONTRAST TECHNIQUE:  Multidetector CT imaging of the abdomen and pelvis was performed following the standard protocol without IV contrast. RADIATION DOSE REDUCTION: This exam was performed according to the departmental dose-optimization program which includes automated exposure control, adjustment of the mA and/or kV according to patient size and/or use of iterative reconstruction technique. COMPARISON:  MR 12/14/2021, CT 02/01/2021 and previous FINDINGS: Lower chest: No pleural or pericardial effusion. Minimal dependent atelectasis or scarring posteriorly in the visualized lung bases. Hepatobiliary: No focal liver abnormality is seen. Status post cholecystectomy. No biliary dilatation. Pancreas: Unremarkable. No pancreatic ductal dilatation or surrounding inflammatory changes. Spleen: Normal in size without focal abnormality. Adrenals/Urinary Tract: No adrenal mass. Bilateral urolithiasis, largest stone cluster 9 mm right lower pole, on the left 5 mm mid collecting system. No hydronephrosis or ureteral calculus. Left ureter is mildly patulous as before, without adjacent inflammatory change or calculus. Urinary bladder is physiologically distended. Stomach/Bowel: Stomach is partially distended, unremarkable. Small bowel decompressed. Normal appendix. The colon is incompletely distended, unremarkable. Vascular/Lymphatic: Minimal calcified aortic plaque without aneurysm. No abdominal or pelvic adenopathy. Reproductive: Uterus and bilateral adnexa are unremarkable. Other: No ascites.  No free air. Musculoskeletal: Post instrumented PLIF L5-S1 without evident complication, stable grade 1 anterolisthesis. Mild lumbar levoscoliosis with multilevel spondylitic change. No fracture or worrisome bone lesion. IMPRESSION: 1. Bilateral nonobstructive urolithiasis 2.  Aortic Atherosclerosis (ICD10-170.0). 3. Stable postop changes L5-S1. Electronically Signed   By: Lucrezia Europe M.D.   On: 07/02/2022 08:35    Procedures Procedures    Medications  Ordered in ED Medications  ondansetron (ZOFRAN) injection 4 mg (4 mg Intravenous Given 07/02/22 0737)  ketorolac (TORADOL) 30 MG/ML injection 15 mg (15 mg Intravenous Given 07/02/22 0737)  fentaNYL (SUBLIMAZE) injection 100 mcg (100 mcg Intravenous Given 07/02/22 0737)  fentaNYL (SUBLIMAZE) injection 100 mcg (100 mcg Intravenous Given 07/02/22 1311)  ondansetron (ZOFRAN) injection 4 mg (4 mg Intravenous Given 07/02/22 1310)    ED Course/ Medical Decision Making/ A&P                           Medical Decision Making Patient presenting with flank pain rating to left leg, swelling neurologic surgery, stenting  for kidney stone.  She has a history of lumbar disease.  Differential diagnosis includes UTI, obstructing kidney stone, bleeding from stenting, bladder spasm, lumbar radiculopathy.  Will obtain imaging, labs and urinalysis.  Medication will be given for pain and she will be observed closely.  Amount and/or Complexity of Data Reviewed Independent Historian:     Details: As an independent historian Labs: ordered.    Details: CBC, metabolic panel, urinalysis -- normal except blood in urine, potassium low, glucose high, calcium low Radiology: ordered and independent interpretation performed.    Details: CT abdomen pelvis-renal stones present without obstruction or other intra-abdominal processes.  CT lumbar spine ordered to evaluate for back pain.  She has degenerative changes that are present, without apparent acute fracture or ruptured disc.  Risk Prescription drug management. Decision regarding hospitalization. Risk Details: Patient presenting with flank pain following recent stent removal.  No obstructing stones.  No hydronephrosis or hydroureter.  Suspect inflammatory changes after recent cystoscopy and stenting.  No indication for hospitalization at this time.  Doubt UTI.  Doubt urinary tract obstruction.  Doubt cauda equina syndrome or fracture of the lumbar spine causing pain.  Stable for  discharge with symptomatic treatment.  Patient has an appointment with urology for later this afternoon.           Final Clinical Impression(s) / ED Diagnoses Final diagnoses:  Flank pain  Hematuria, unspecified type    Rx / DC Orders ED Discharge Orders     None         Daleen Bo, MD 07/02/22 1652

## 2022-07-04 LAB — CALCULI, WITH PHOTOGRAPH (CLINICAL LAB)
Calcium Oxalate Dihydrate: 20 %
Calcium Oxalate Monohydrate: 80 %
Weight Calculi: 20 mg

## 2022-07-11 ENCOUNTER — Other Ambulatory Visit: Payer: Self-pay | Admitting: Internal Medicine

## 2022-07-11 ENCOUNTER — Ambulatory Visit: Payer: No Typology Code available for payment source | Admitting: Physician Assistant

## 2022-07-11 DIAGNOSIS — F5101 Primary insomnia: Secondary | ICD-10-CM

## 2022-07-12 ENCOUNTER — Other Ambulatory Visit (HOSPITAL_COMMUNITY): Payer: Self-pay

## 2022-07-13 ENCOUNTER — Other Ambulatory Visit (HOSPITAL_COMMUNITY): Payer: Self-pay

## 2022-07-16 ENCOUNTER — Ambulatory Visit: Payer: No Typology Code available for payment source | Admitting: Physician Assistant

## 2022-07-19 ENCOUNTER — Ambulatory Visit: Payer: No Typology Code available for payment source | Admitting: *Deleted

## 2022-07-19 ENCOUNTER — Ambulatory Visit: Payer: No Typology Code available for payment source | Admitting: Urology

## 2022-07-19 DIAGNOSIS — I10 Essential (primary) hypertension: Secondary | ICD-10-CM

## 2022-07-19 DIAGNOSIS — Z794 Long term (current) use of insulin: Secondary | ICD-10-CM

## 2022-07-19 NOTE — Chronic Care Management (AMB) (Signed)
Care Management    RN Visit Note  07/19/2022 Name: [name] MRN: [id number] DOB: 03-20-1966  Subjective: [name] is a 56 y.o. year old female who is a primary care patient of [name] Spar, MD. The care management team was consulted for assistance with disease management and care coordination needs.    Engaged with patient by telephone for follow up visit in response to provider referral for case management and/or care coordination services.   Consent to Services:   Ms. Beers was given information about Care Management services today including:  Care Management services includes personalized support from designated clinical staff supervised by her physician, including individualized plan of care and coordination with other care providers 24/7 contact phone numbers for assistance for urgent and routine care needs. The patient may stop case management services at any time by phone call to the office staff.  Patient agreed to services and consent obtained.   Assessment: Review of patient past medical history, allergies, medications, health status, including review of consultants reports, laboratory and other test data, was performed as part of comprehensive evaluation and provision of chronic care management services.   SDOH (Social Determinants of Health) assessments and interventions performed:    Care Plan  Allergies  Allergen Reactions   Ciprofloxacin Hives and Swelling   Penicillins Anaphylaxis and Rash   Codeine Other (See Comments)    had hallicunations   Morphine Nausea And Vomiting and Other (See Comments)    recieved in ED due to [facility] and had multple doses - gave visual hallucinations and vomitting.   Sulfonamide Derivatives Other (See Comments)    REACTION: Unsure - childhood allergy   Vancomycin Itching    Outpatient Encounter Medications as of 07/19/2022  Medication Sig Note   albuterol (VENTOLIN HFA) 108 (90 Base) MCG/ACT inhaler Inhale 2 puffs  into the lungs every 6 (six) hours as needed for wheezing or shortness of breath.     blood glucose meter kit and supplies Use up to four times daily as directed.    buPROPion (WELLBUTRIN XL) 150 MG 24 hr tablet Take 150 mg by mouth daily.    cyclobenzaprine (FLEXERIL) 10 MG tablet Take 1 tablet (10 mg total) by mouth 3 (three) times daily as needed for muscle spasms.    Dulaglutide (TRULICITY) 3 WU/9.8JX SOPN Inject 3 mg as directed once a week. 07/02/2022: Tuesday   Empagliflozin-metFORMIN HCl (SYNJARDY) 12.5-500 MG TABS Take 1 tablet by mouth 2 (two) times daily. (Patient taking differently: Take 1 tablet by mouth daily.)    furosemide (LASIX) 20 MG tablet Take 20 mg by mouth 2 (two) times daily as needed for fluid or edema.    gabapentin (NEURONTIN) 600 MG tablet Take 1 tablet (600 mg total) by mouth 3 (three) times daily.    glucose blood test strip Test up to 4 times a day    hydrOXYzine (VISTARIL) 50 MG capsule TAKE 1 CAPSULE(50 MG) BY MOUTH AT BEDTIME    Lancets (FREESTYLE) lancets Use to test 4 times daily    lisinopril-hydrochlorothiazide (ZESTORETIC) 20-12.5 MG tablet Take 1 tablet by mouth daily.    omeprazole (PRILOSEC) 20 MG capsule Take 1 capsule (20 mg total) by mouth daily.    ondansetron (ZOFRAN) 4 MG tablet Take 1 tablet (4 mg total) by mouth every 8 (eight) hours as needed for nausea or vomiting.    oxyCODONE (OXY IR/ROXICODONE) 5 MG immediate release tablet Take 1 tablet (5 mg total) by mouth every 4 (  four) hours as needed (pain).    Potassium Citrate (UROCIT-K 15) 15 MEQ (1620 MG) TBCR Take 1 tablet by mouth in the morning and at bedtime.    simvastatin (ZOCOR) 40 MG tablet Take 1 tablet (40 mg total) by mouth every evening for cholesterol    nitrofurantoin, macrocrystal-monohydrate, (MACROBID) 100 MG capsule Take 1 capsule (100 mg total) by mouth 2 (two) times daily. (Patient not taking: Reported on 07/19/2022)    oxybutynin (DITROPAN-XL) 5 MG 24 hr tablet Take 5 mg by mouth at  bedtime. (Patient not taking: Reported on 07/19/2022)    No facility-administered encounter medications on file as of 07/19/2022.    Patient Active Problem List   Diagnosis Date Noted   Chronic mucoid otitis media of left ear 05/16/2022   Primary insomnia 05/09/2022   ASCUS of cervix with negative high risk HPV 01/16/2022   Spondylolisthesis of lumbar region 12/05/2021   Spinal stenosis of lumbar region with neurogenic claudication 10/25/2021   Chronic bilateral low back pain with left-sided sciatica 10/18/2021   Constipation 01/18/2021   GERD (gastroesophageal reflux disease) 03/09/2020   Other intervertebral disc degeneration, lumbar region 05/24/2019   OSA (obstructive sleep apnea) 12/08/2014   Depression 07/15/2013   Dyspnea 11/12/2012   Headache(784.0) 10/14/2012   Neuropathy 10/14/2012   Essential hypertension, benign 05/10/2012   Diabetes mellitus ([place]) 09/29/2008   Mixed hyperlipidemia 09/29/2008   Morbid obesity ([place]) 08/31/2008    Conditions to be addressed/monitored: HTN and DMII  Care Plan : RN Care Manager Plan of Care  Updates made by Kassie Mends, RN since 07/19/2022 12:00 AM  Completed 07/19/2022   Problem: No plan of care established for management of chronic disease states  (DM2, HTN, Chronic pain) Resolved 07/19/2022  Priority: High     Long-Range Goal: Development of plan of care for chronic disease management  (DM2, HTN, Chronic pain) Completed 07/19/2022  Start Date: 12/26/2021  Expected End Date: 10/23/2022  Priority: High  Note:   Current Barriers:  Knowledge Deficits related to plan of care for management of HTN and DMII  Patient reports she lives with spouse and was working full time and independent in all aspects of her care until she recently had 2 back surgeries, pt reports she has walker and uses as needed mainly when leaving home. Patient reports she is now back at work, is presently on vacation and the beach and states " I'm doing very well"    Pt reports she checks CBG TID with fasting ranges 120-130 . Pt reports she saw endocrinologist and is now off insulin pump. Pt reports she has a blood pressure cuff but does not currently check blood pressure. Pt reports incision is now completely healed from back surgery. Patient did receive advanced directives and has not yet completed 07/19/22- pt reports she is at work and doing well, has lost over 40 pounds on purpose and feels better, CBG readings "good",  no new concerns reported   RNCM Clinical Goal(s):  Patient will verbalize understanding of plan for management of HTN and DMII as evidenced by patient report, review of EHR and  through collaboration with RN Care manager, provider, and care team.   Interventions: 1:1 collaboration with primary care provider regarding development and update of comprehensive plan of care as evidenced by provider attestation and co-signature Inter-disciplinary care team collaboration (see longitudinal plan of care) Evaluation of current treatment plan related to  self management and patient's adherence to plan as established by provider  Diabetes  Interventions:  (Status:  New goal. and Goal on track:  Yes.) Long Term Goal Assessed patient's understanding of A1c goal: <7% Reviewed medications with patient and discussed importance of medication adherence Counseled on importance of regular laboratory monitoring as prescribed Review of patient status, including review of consultants reports, relevant laboratory and other test results, and medications completed Reinforced importance of regular exercise Reinforced carbohydrate modified diet and importance of avoiding concentrated sweets and portion control Reviewed plan of care with patient including case closure today Lab Results  Component Value Date   HGBA1C 5.7 (H) 11/30/2021   Hypertension Interventions:  (Status:  New goal. and Goal on track:  Yes.) Long Term Goal Last practice recorded BP readings:  BP  Readings from Last 3 Encounters:  12/15/21 (!) 107/48  12/06/21 (!) 109/51  12/01/21 132/76  Most recent eGFR/CrCl:  Lab Results  Component Value Date   EGFR 104 11/30/2021    No components found for: CRCL  Reviewed medications with patient and discussed importance of compliance Advised patient, providing education and rationale, to monitor blood pressure daily and record, calling PCP for findings outside established parameters Reviewed upcoming scheduled appointments Reinforced low sodium diet, importance of reading labels and limiting fast food Pain assessment completed  Patient Goals/Self-Care Activities: Take medications as prescribed   Attend all scheduled provider appointments Call provider office for new concerns or questions  check blood sugar at prescribed times: three times daily check feet daily for cuts, sores or redness enter blood sugar readings and medication or insulin into daily log take the blood sugar log to all doctor visits take the blood sugar meter to all doctor visits drink 6 to 8 glasses of water each day fill half of plate with vegetables limit fast food meals to no more than 1 per week manage portion size wash and dry feet carefully every day check blood pressure weekly choose a place to take my blood pressure (home, clinic or office, retail store) write blood pressure results in a log or diary take blood pressure log to all doctor appointments take medications for blood pressure exactly as prescribed report new symptoms to your doctor eat more whole grains, fruits and vegetables, lean meats and healthy fats Complete advanced directives Follow low sodium diet- limit fast food, read labels Practice portion control Follow carbohydrate modified diet Try to do some type of regular exercise, walking is good Continue with working towards your weight loss- keep up the good work!! Case closure today- please talk with your doctor if you have any future  needs for care management       Plan: No further follow up required: case closure today  Jacqlyn Larsen [facility], BSN RN Case Advertising copywriter Primary Care [phone]

## 2022-07-19 NOTE — Patient Instructions (Signed)
Visit Information  Thank you for taking time to visit with me today. Please don't hesitate to contact me if I can be of assistance to you before our next scheduled telephone appointment.  Following are the goals we discussed today:  Take medications as prescribed   Attend all scheduled provider appointments Call provider office for new concerns or questions  check blood sugar at prescribed times: three times daily check feet daily for cuts, sores or redness enter blood sugar readings and medication or insulin into daily log take the blood sugar log to all doctor visits take the blood sugar meter to all doctor visits drink 6 to 8 glasses of water each day fill half of plate with vegetables limit fast food meals to no more than 1 per week manage portion size wash and dry feet carefully every day check blood pressure weekly choose a place to take my blood pressure (home, clinic or office, retail store) write blood pressure results in a log or diary take blood pressure log to all doctor appointments take medications for blood pressure exactly as prescribed report new symptoms to your doctor eat more whole grains, fruits and vegetables, lean meats and healthy fats Complete advanced directives Follow low sodium diet- limit fast food, read labels Practice portion control Follow carbohydrate modified diet Try to do some type of regular exercise, walking is good Continue with working towards your weight loss- keep up the good work!! Case closure today- please talk with your doctor if you have any future needs for care management   Please call the care guide team at [phone] if you need to cancel or reschedule your appointment.   If you are experiencing a Mental Health or Behavioral Health Crisis or need someone to talk to, please call the Suicide and Crisis Lifeline: 988 call the [place] National Suicide Prevention Lifeline: 4032956857 or TTY: 862-749-9280 TTY 934-372-8220) to talk  to a trained counselor call 1-800-273-TALK (toll free, 24 hour hotline) go to [facility] [address], [place] [phone]) call the [facility] Line: [phone] call 911   The patient verbalized understanding of instructions, educational materials, and care plan provided today and DECLINED offer to receive copy of patient instructions, educational materials, and care plan.   Irving Shows [facility], BSN RN Case Manager [facility] [phone]

## 2022-07-23 ENCOUNTER — Ambulatory Visit: Payer: No Typology Code available for payment source | Admitting: Physician Assistant

## 2022-07-26 ENCOUNTER — Telehealth: Payer: Self-pay

## 2022-07-26 DIAGNOSIS — N200 Calculus of kidney: Secondary | ICD-10-CM

## 2022-07-26 DIAGNOSIS — R109 Unspecified abdominal pain: Secondary | ICD-10-CM

## 2022-07-26 NOTE — Telephone Encounter (Signed)
Patient called with no answer. Detailed message left informing patient per J. Summerlin PA an order for CT stone study was placed. If patient developed severe pain she would need to go to ER for evaluation.

## 2022-07-26 NOTE — Telephone Encounter (Signed)
Patient called stating that she is still having pain on her left side. Patient states she feels like she may have another stone and wants to know if she should get another x-ray. No f/u  scheduled. Please advise.

## 2022-08-07 ENCOUNTER — Ambulatory Visit (HOSPITAL_COMMUNITY): Payer: No Typology Code available for payment source

## 2022-08-09 ENCOUNTER — Ambulatory Visit (INDEPENDENT_AMBULATORY_CARE_PROVIDER_SITE_OTHER): Payer: No Typology Code available for payment source | Admitting: Internal Medicine

## 2022-08-09 ENCOUNTER — Encounter: Payer: Self-pay | Admitting: Internal Medicine

## 2022-08-09 DIAGNOSIS — R42 Dizziness and giddiness: Secondary | ICD-10-CM | POA: Insufficient documentation

## 2022-08-09 DIAGNOSIS — R110 Nausea: Secondary | ICD-10-CM

## 2022-08-09 MED ORDER — MECLIZINE HCL 25 MG PO TABS: 25.0000 mg | ORAL_TABLET | Freq: Three times a day (TID) | ORAL | 0 refills | Status: DC | PRN

## 2022-08-09 MED ORDER — ONDANSETRON HCL 4 MG PO TABS: 4.0000 mg | ORAL_TABLET | Freq: Three times a day (TID) | ORAL | 0 refills | Status: DC | PRN

## 2022-08-09 NOTE — Assessment & Plan Note (Signed)
Describes a spinning sensation with associated nausea/vomiting that began yesterday morning.  Symptoms are worsened with sudden head movements and changes in position, such as lying down.  She has a documented prior history of vertigo. -Today I have prescribed meclizine for treatment of vertigo.  I have also refilled her Zofran for nausea relief.  She was instructed to contact our office if her symptoms not improve within a few days of meclizine use.

## 2022-08-09 NOTE — Progress Notes (Addendum)
   Acute Telephone Visit  Subjective:     Patient ID: [name], female    DOB: 08/03/1966, 56 y.o.   MRN: [id number]  Chief Complaint  Patient presents with   Ear Pain    Started 08/08/2022 in both ears. Everything is spinning otherwise no pain.    [name] is a 56 year old woman evaluated today via acute telephone encounter.  She called [facility] this morning reporting a 1 day history of "room spinning" and nausea with vomiting.  [name] states that her symptoms began when she woke up yesterday morning.  She felt well enough to go to work, however continued to experience mild symptoms throughout the day.  Her symptoms abruptly worsened when she laid down to go to sleep last night.  She became nauseated and had 2 episodes of vomiting.  Her symptoms have continued this morning.  She states that she is experienced this previously and was told it was related to her inner ear.  Per chart review, she has previously been treated for vertigo but is not currently on any prescription medication.  [name] states that her symptoms worsen with bending forward, lying down, and sudden head movements.  She has a history of T2DM and has checked her blood sugar during these episodes but states that her blood sugar has been normal.  She otherwise denies acute concerns or symptoms currently including fever/chills, headaches, changes in her vision, chest pain, palpitations, shortness of breath, diarrhea/constipation, blood in her urine/stool, numbness/tingling/weakness in her upper and lower extremities.  Review of Systems  Gastrointestinal:  Positive for nausea and vomiting.  Neurological:  Positive for dizziness.  All other systems reviewed and are negative.     Objective:    No objective findings or results as this was a virtual encounter.     Assessment & Plan:   Problem List Items Addressed This Visit       Other   Vertigo - Primary    Describes a spinning sensation with associated  nausea/vomiting that began yesterday morning.  Symptoms are worsened with sudden head movements and changes in position, such as lying down.  She has a documented prior history of vertigo. -Today I have prescribed meclizine for treatment of vertigo.  I have also refilled her Zofran for nausea relief.  She was instructed to contact our office if her symptoms not improve within a few days of meclizine use.      Relevant Medications   meclizine (ANTIVERT) 25 MG tablet   Other Visit Diagnoses     Nausea       Relevant Medications   ondansetron (ZOFRAN) 4 MG tablet       Meds ordered this encounter  Medications   meclizine (ANTIVERT) 25 MG tablet    Sig: Take 1 tablet (25 mg total) by mouth 3 (three) times daily as needed for dizziness or nausea.    Dispense:  30 tablet    Refill:  0   ondansetron (ZOFRAN) 4 MG tablet    Sig: Take 1 tablet (4 mg total) by mouth every 8 (eight) hours as needed for nausea or vomiting.    Dispense:  20 tablet    Refill:  0    Return if symptoms worsen or fail to improve.  Billie Lade, MD  I provided 12 minutes of non-face-to-face time during this encounter.

## 2022-08-10 ENCOUNTER — Ambulatory Visit (HOSPITAL_COMMUNITY)
Admission: RE | Admit: 2022-08-10 | Discharge: 2022-08-10 | Disposition: A | Discharge status: Home / Self Care | Payer: No Typology Code available for payment source | Source: Ambulatory Visit | Attending: Physician Assistant | Admitting: Physician Assistant

## 2022-08-10 DIAGNOSIS — R109 Unspecified abdominal pain: Secondary | ICD-10-CM | POA: Diagnosis present

## 2022-08-10 DIAGNOSIS — N200 Calculus of kidney: Secondary | ICD-10-CM | POA: Insufficient documentation

## 2022-08-11 ENCOUNTER — Other Ambulatory Visit: Payer: Self-pay | Admitting: Internal Medicine

## 2022-08-11 DIAGNOSIS — F5101 Primary insomnia: Secondary | ICD-10-CM

## 2022-08-14 ENCOUNTER — Other Ambulatory Visit (HOSPITAL_COMMUNITY): Payer: Self-pay

## 2022-08-15 ENCOUNTER — Telehealth: Payer: Self-pay

## 2022-08-15 NOTE — Telephone Encounter (Signed)
-----   Message from [facility], [place] sent at 08/14/2022  9:37 AM EDT ----- Please let pt know her CT Urogram indicates small stones in both kidneys as noted in the past. No obstructing stones noted. No stones in ureters or hydronephrosis noted. None of these findings would explain the flank pain pt was having a few weeks ago. She needs appt with Dr. Ronne Binning in 6 months after renal US. Appt and [place] need to be scheduled under Dr. Ronne Binning. ----- Message ----- From: Interface, Rad Results In Sent: 08/12/2022  10:03 PM EDT To: Sydnee Levans, PA-C

## 2022-08-15 NOTE — Telephone Encounter (Signed)
Made patient aware that CT urogram indicated small stones in both kidney. Patient voiced understanding and voiced that she will like for both stones to be remove because she is tried of being in pain. Made patient aware that I will send her concern to Dr.McKenzie and Marchelle Folks for consideration. Patient voiced understanding.

## 2022-08-17 ENCOUNTER — Encounter: Payer: Self-pay | Admitting: Internal Medicine

## 2022-08-17 ENCOUNTER — Ambulatory Visit (INDEPENDENT_AMBULATORY_CARE_PROVIDER_SITE_OTHER): Payer: No Typology Code available for payment source | Admitting: Internal Medicine

## 2022-08-17 ENCOUNTER — Telehealth: Payer: Self-pay | Admitting: Internal Medicine

## 2022-08-17 DIAGNOSIS — R42 Dizziness and giddiness: Secondary | ICD-10-CM

## 2022-08-17 NOTE — Progress Notes (Signed)
   Virtual Visit via Telephone Note  I connected with [name] on 08/17/22 at  4:40 PM EDT by telephone and verified that I am speaking with the correct person using two identifiers.  Location: Patient: [address] [place], [place] [address] Provider: [facility] [address] [place] [address]   I discussed the limitations, risks, security and privacy concerns of performing an evaluation and management service by telephone and the availability of in person appointments. I also discussed with the patient that there may be a patient responsible charge related to this service. The patient expressed understanding and agreed to proceed.   History of Present Illness: [name] is a 56 year old woman seen today via telephone encounter for vertigo symptoms.  I last spoke with her via telephone encounter on 8/31.  At that time she endorsed a 1 day history of "room spinning" with nausea and vomiting.  She was prescribed meclizine 25 mg and Zofran for as needed use.  In the interim, [name] states that her symptoms have overall improved, but are still present.  She remains nauseated but is no longer vomiting.  The spinning sensation has improved but is not completely resolved.  She also notes occasional left ear pain as well.  She has not noted any drainage in her left ear.  She denies fullness of the left ear.  She additionally denies systemic symptoms of fever/chills.  [name] continues to work without significant limitation due to the symptoms.   Assessment and Plan:  Vertigo Symptoms overall improved from 1 week ago, but not completely resolved.  She is endorsing occasional left ear pain, which is interesting given her history of chronic mucoid otitis media of the left ear.  It appears that she was previously referred to ENT and seen by provider within the [facility] network, however I am unable to see their documentation. We discussed next steps, and I recommended  that she increase meclizine to 50 mg twice daily as needed.  I instructed her that she can take a daily maximum total dose of 100 mg.  She will try this over the weekend, and if her symptoms have not improved we will see her for an in person appointment on Monday and she may need to return to care with ENT.  Follow Up Instructions: Increase meclizine over the weekend as discussed.  If no symptomatic improvement, plan for in person follow-up on Monday afternoon.   I discussed the assessment and treatment plan with the patient. The patient was provided an opportunity to ask questions and all were answered. The patient agreed with the plan and demonstrated an understanding of the instructions.   The patient was advised to call back or seek an in-person evaluation if the symptoms worsen or if the condition fails to improve as anticipated.  I provided 11 minutes of non-face-to-face time during this encounter.   Billie Lade, MD

## 2022-08-17 NOTE — Telephone Encounter (Signed)
Pt called stating she is still having some issues with her left ear from vertigo. Wants to know if a nurse can give her a call to go over sx & see what she needs to do next?

## 2022-08-17 NOTE — Telephone Encounter (Signed)
I will need posting sheet please

## 2022-08-20 ENCOUNTER — Ambulatory Visit (INDEPENDENT_AMBULATORY_CARE_PROVIDER_SITE_OTHER): Payer: No Typology Code available for payment source | Admitting: Internal Medicine

## 2022-08-20 ENCOUNTER — Encounter: Payer: Self-pay | Admitting: Internal Medicine

## 2022-08-20 VITALS — BP 110/68 | HR 80 | Ht <= 58 in | Wt 176.4 lb

## 2022-08-20 DIAGNOSIS — H6532 Chronic mucoid otitis media, left ear: Secondary | ICD-10-CM | POA: Diagnosis not present

## 2022-08-20 DIAGNOSIS — R42 Dizziness and giddiness: Secondary | ICD-10-CM

## 2022-08-20 MED ORDER — MECLIZINE HCL 25 MG PO TABS: 25.0000 mg | ORAL_TABLET | Freq: Three times a day (TID) | ORAL | 0 refills | Status: DC | PRN

## 2022-08-20 MED ORDER — PROMETHAZINE HCL 25 MG PO TABS: 25.0000 mg | ORAL_TABLET | Freq: Three times a day (TID) | ORAL | 0 refills | Status: DC | PRN

## 2022-08-20 NOTE — Patient Instructions (Signed)
It was a pleasure to see you today.  Thank you for giving [place] the opportunity to be involved in your care.  Below is a brief recap of your visit and next steps.  We will plan to see you again on 10/08/22 with Dr. Allena Katz  Summary I have refilled meclizine and added phenergan I have also placed a referral to ENT  Next steps You should receive a phone call to schedule ENT appt Call us if you need anything

## 2022-08-20 NOTE — Telephone Encounter (Signed)
Left message on patient voice mail to schedule office visit to discuss surgical options with Dr. Ronne Binning

## 2022-08-20 NOTE — Progress Notes (Signed)
Acute Office Visit  Subjective:     Patient ID: [name], female    DOB: November 12, 1966, 56 y.o.   MRN: [id number]  Chief Complaint  Patient presents with   Dizziness    Left ear started on 08/09/2022   [name] presents today for in person evaluation of left ear pain and dizziness.  She has previously been evaluated by me via telephone on 8/31 and 9/8 for vertigo symptoms.  Initially I spoke with her on 8/31 for 1 day history of "room spinning" with nausea and vomiting.  Meclizine and Zofran were prescribed at that time.  She states that this improved her symptoms, but they were still somewhat present, prompting follow-up on 9/8.  At that time she also noted intermittent left ear discomfort with waxing and waning severity.  I recommended that she increase meclizine to 50 mg to see if this would completely alleviate her symptoms and we plan for follow-up today if there was no significant improvement over the weekend.  Today [name] states that her symptoms are largely unchanged from last week.  She has tried increasing meclizine, which has been effective, but her dizziness has not completely abated.  The symptoms are not always present and are triggered by sudden motions such as bending over to tie her shoes.  She continues to endorse periodic left ear pain.  [name] was previously evaluated by ENT with [facility] on 6/1 and 6/3.  I am able to see that these encounters occurred, but no documentation from the encounters.  [name] cannot recall any details from her ENT appointment other than that they did not prescribe any medication and did not need follow-up with her.  [name] has not noted any drainage from her ear.  She denies tinnitus, muffled hearing, and hearing loss.  She additionally denies systemic symptoms of fever/chills.  Review of Systems  Neurological:  Positive for dizziness.  All other systems reviewed and are negative.     Objective:    BP 110/68    Pulse 80   Ht 4\' 9"  (1.448 m)   Wt 176 lb 6.4 oz (80 kg)   LMP 07/16/2012   SpO2 92%   BMI 38.17 kg/m   Physical Exam HENT:     Right Ear: Tympanic membrane, ear canal and external ear normal.     Left Ear: Tympanic membrane, ear canal and external ear normal.     Ears:     Comments: TM of left ear intact, no bulging, erythema, or purulent drainage appreciated. Neurological:     General: No focal deficit present.     Mental Status: She is oriented to person, place, and time.     Sensory: No sensory deficit.     Motor: No weakness.     Coordination: Coordination normal.     Gait: Gait normal.    No results found for any visits on 08/20/22.     Assessment & Plan:   Problem List Items Addressed This Visit     Vertigo    Her symptoms are overall improved from initial encounter on 8/31, however she is concerned that the symptoms are not completely gone and she has periodic left ear discomfort.  Examination of the left ear is benign.  She has a history of chronic mucoid otitis media of the left ear and was referred to ENT for evaluation.  I am unable to see documentation from this encounter. -Treatment options were discussed.  Ms.  Alvarado has an upcoming trip early next month.  She is hopeful that she will not be experiencing the symptoms by that time.  Accordingly, I have placed a referral to ENT for evaluation to rule out any inner ear pathology.  I have also refilled meclizine and written a prescription for Phenergan at her request. -She has follow-up scheduled with Dr. Allena Katz on 10/30      Relevant Medications   meclizine (ANTIVERT) 25 MG tablet   promethazine (PHENERGAN) 25 MG tablet   Other Relevant Orders   Ambulatory referral to ENT    Meds ordered this encounter  Medications   meclizine (ANTIVERT) 25 MG tablet    Sig: Take 1 tablet (25 mg total) by mouth 3 (three) times daily as needed for dizziness or nausea.    Dispense:  30 tablet    Refill:  0   promethazine  (PHENERGAN) 25 MG tablet    Sig: Take 1 tablet (25 mg total) by mouth every 8 (eight) hours as needed for nausea or vomiting.    Dispense:  20 tablet    Refill:  0    Return if symptoms worsen or fail to improve.  Billie Lade, MD

## 2022-08-20 NOTE — Telephone Encounter (Signed)
Scheduled appt to see MD on 09/29 with patient

## 2022-08-20 NOTE — Assessment & Plan Note (Addendum)
Her symptoms are overall improved from initial encounter on 8/31, however she is concerned that the symptoms are not completely gone and she has periodic left ear discomfort.  Examination of the left ear is benign.  She has a history of chronic mucoid otitis media of the left ear and was referred to ENT for evaluation.  I am unable to see documentation from this encounter. -Treatment options were discussed.  Ms. Yablonski has an upcoming trip early next month.  She is hopeful that she will not be experiencing the symptoms by that time.  Accordingly, I have placed a referral to ENT for evaluation to rule out any inner ear pathology.  I have also refilled meclizine and written a prescription for Phenergan at her request. -She has follow-up scheduled with Dr. Allena Katz on 10/30

## 2022-08-30 ENCOUNTER — Telehealth: Payer: Self-pay

## 2022-08-30 NOTE — Telephone Encounter (Signed)
Patient called advising that she is having left flank pain. She advised that she has a kidney stone in her left kidney. Dr. Alyson Ingles is her provider but he is out until next week. Patient wanted to know what was recommended for her to do. She has a follow up appt next Friday 9/29.

## 2022-09-03 ENCOUNTER — Telehealth: Payer: Self-pay

## 2022-09-03 NOTE — Telephone Encounter (Signed)
Patient called to see if she can have her apt moved up to discuss surgery.  Returned her call to confirm apt time and date, no answer left voicemail for her to return call.

## 2022-09-04 ENCOUNTER — Other Ambulatory Visit: Payer: Self-pay | Admitting: Internal Medicine

## 2022-09-04 DIAGNOSIS — R42 Dizziness and giddiness: Secondary | ICD-10-CM

## 2022-09-05 ENCOUNTER — Ambulatory Visit: Payer: No Typology Code available for payment source | Admitting: Urology

## 2022-09-06 ENCOUNTER — Other Ambulatory Visit: Payer: Self-pay | Admitting: Internal Medicine

## 2022-09-06 DIAGNOSIS — F5101 Primary insomnia: Secondary | ICD-10-CM

## 2022-09-06 DIAGNOSIS — R42 Dizziness and giddiness: Secondary | ICD-10-CM

## 2022-09-07 ENCOUNTER — Ambulatory Visit: Payer: No Typology Code available for payment source | Admitting: Urology

## 2022-09-21 ENCOUNTER — Ambulatory Visit (INDEPENDENT_AMBULATORY_CARE_PROVIDER_SITE_OTHER): Payer: No Typology Code available for payment source | Admitting: Urology

## 2022-09-21 ENCOUNTER — Encounter: Payer: Self-pay | Admitting: Urology

## 2022-09-21 VITALS — BP 117/73 | HR 83

## 2022-09-21 DIAGNOSIS — N200 Calculus of kidney: Secondary | ICD-10-CM

## 2022-09-21 NOTE — Patient Instructions (Signed)
Dear [name],   Thank you for choosing [facility] [place] to assist in your urologic care for your upcoming surgery. The following information below includes specific dates and details related to surgery:   The Surgical Procedure you are scheduled to have performed is cystoscopy with bilateral retrograde, bilateral ureteroscopy with laser, bilateral stent placement   Surgery Date: 09/27/2022   Physician performing the surgery: Dr. Wilkie Aye  Please hold trulicity dose until after surgery    Do not eat or drink after midnight the day before your surgery.    You will need a driver the day of surgery and will not be able to operate heavy machinery for 24 hours after.    Your surgery will be performed at  [facility] [address], [place] [address]   Enter at the Main Entrance and check in at the SAME DAY SURGERY desk.     Pre-Admit Testing Info   Pre- Admit appointments are interview with an anesthesiologist or a pre-operative anesthesia nurse. These appointments are typically completed as an in person visit but can take place over the telephone.  You will be contacted to confirm the date and time window.    If you have any questions or concerns, please don't hesitate to call the office at [phone]   Thank you,    Alfredo Martinez, RN Clinical Surgery Coordinator [facility]     Ureteroscopy  Ureteroscopy is a procedure to check for and treat problems inside part of the urinary tract. In this procedure, a long rigid or flexible tube with a lens and light at the end (ureteroscope) is used to look at the inside of the kidneys and the ureters. The ureters are the tubes that carry urine from the kidneys to the bladder. The ureteroscope is inserted into one or both of the ureters. You may need this procedure if you have frequent urinary tract infections (UTIs), blood in your urine, or a stone in one or both of your ureters. A  ureteroscopy can be done: To find the cause of urine blockage in a ureter and to evaluate other abnormalities inside the ureters or kidneys. To remove stones. To remove or treat growths of tissue (polyps), abnormal tissue, and some types of tumors. To remove a tissue sample and check it for disease under a microscope (biopsy). Tell a health care provider about: Any allergies you have. All medicines you are taking, including vitamins, herbs, eye drops, creams, and over-the-counter medicines. Any problems you or family members have had with anesthetic medicines. Any bleeding problems you have. Any surgeries you have had. Any medical conditions you have. Whether you are pregnant or may be pregnant. What are the risks? Your health care provider will talk with you about risks. These may include: Abdominal pain or a burning feeling or pain while urinating. Abnormal bleeding. A UTI. Allergic reactions to medicines. Scarring that narrows the ureter (stricture) or swelling. Creating a hole (perforation) in the ureter. Damage to other structures or organs, such as the part of your body that drains urine from your bladder (urethra), your bladder, or your uterus. What happens before the procedure? When to stop eating and drinking  8 hours before your procedure Stop eating most foods. Do not eat meat, fried foods, or fatty foods. Eat only light foods, such as toast or crackers. All liquids are okay except energy drinks and alcohol. 6 hours before your procedure Stop eating. Drink only clear liquids, such  as water, clear fruit juice, black coffee, plain tea, and sports drinks. Do not drink energy drinks or alcohol. 2 hours before your procedure Stop drinking all liquids. You may be allowed to take medicines with small sips of water. Medicines Ask your health care provider about: Changing or stopping your regular medicines. These include any diabetes medicines or blood thinners you  take. Taking medicines such as aspirin and ibuprofen. These medicines can thin your blood. Do not take these medicines unless your health care provider tells you to. Taking over-the-counter medicines, vitamins, herbs, and supplements. General instructions Do not use any products that contain nicotine or tobacco for at least 4 weeks before the procedure. These products include cigarettes, chewing tobacco, and vaping devices, such as e-cigarettes. If you need help quitting, ask your health care provider. If you will be going home right after the procedure, plan to have a responsible adult: Take you home from the hospital or clinic. You will not be allowed to drive. Care for you for the time you are told. Ask your health care provider what steps will be taken to help prevent infection. These may include: Washing skin with a soap that kills germs. Receiving antibiotic medicine. Tests You may have an exam or testing. You may have a urine sample taken to check for infection. What happens during the procedure? An IV will be inserted into one of your veins. You may be given: A sedative. This helps you relax. Anesthesia. This will: Numb certain areas of your body. Make you fall asleep for surgery. Your urethra will be cleaned with a germ-killing solution. The ureteroscope will be passed through your urethra into your bladder. A salt-water solution will be sent through the ureteroscope to fill your bladder. This will help the health care provider see the openings of your ureters more clearly. The ureteroscope will be passed into your ureter. If a growth is found, a biopsy may be done. If a stone is found, it may be removed through the ureteroscope, or the stone may be broken up using a laser, shock waves, or electrical energy. In some cases, if the ureter is too small, a tube may be inserted that keeps the ureter open (ureteral stent). The stent may be left in place for 1 or 2 weeks, and then the  ureteroscopy procedure will be done again. The scope will be removed, and your bladder will be emptied. The procedure may vary among health care providers and hospitals. What happens after the procedure? Your blood pressure, heart rate, breathing rate, and blood oxygen level will be monitored until you leave the hospital or clinic. It is up to you to get the results of your procedure. Ask your health care provider, or the department that is doing the procedure, when your results will be ready. Summary Ureteroscopy is a procedure used to look at the inside of the kidneys and the ureters. You may need this procedure if you have frequent urinary tract infections (UTIs), blood in your urine, or a stone in one or both of your ureters. Follow instructions from your health care provider about eating and drinking. In some cases, if the ureter is too small, a tube may be inserted that keeps the ureter open (ureteral stent). The stent may be left in place for 1 or 2 weeks to keep the ureter open, and then the ureteroscopy procedure will be done again. This information is not intended to replace advice given to you by your health care provider. Make sure  you discuss any questions you have with your health care provider. Document Revised: 03/23/2022 Document Reviewed: 03/23/2022 [employer] Patient Education  [place].

## 2022-09-21 NOTE — Addendum Note (Signed)
Addended by: Iris Pert on: 09/21/2022 12:25 PM   Modules accepted: Orders

## 2022-09-21 NOTE — H&P (View-Only) (Signed)
09/21/2022 12:10 PM   [name] 1966-07-25 [id number]  Referring provider: Lindell Spar, MD [address] [place],  [place] [address]  Followup nephrolithiasis   HPI: [name] is a 56yo here for followup for nephrolithiasis. She developed bilateral flank pain which is worse on the left. CT from 9/3 shows a 21m right lower pole calculus and a left 496mmid pole calculus. Sh remains on UrocitK and drinks 60-80oz of water daily.    PMH: Past Medical History:  Diagnosis Date   Diabetes mellitus    insulin pump 2013   Dysphagia 03/09/2020   Epidural hematoma ([address]   Hypercholesteremia    Hypertension    Incarcerated umbilical hernia 03/12/43/9753 Kidney stone    Kidney stones 08/31/2008   Qualifier: Diagnosis of  By: FeJonna MunroD, Cornelius     Neuropathy    Obesity    Obstructive sleep apnea    Palpitations    Shortness of breath     Surgical History: Past Surgical History:  Procedure Laterality Date   BIOPSY  06/23/2020   Procedure: BIOPSY;  Surgeon: RoDaneil DolinMD;  [facility];  Service: Endoscopy;;   CARDIAC CATHETERIZATION  12/11/2012   normal coronary arteries   CESAREAN SECTION  1994;2000   [place] WITH PROP[place]OL N/A 06/23/2020   large amount of stool in entire colon, prep inadequate.    CYSTOSCOPY W/ URETERAL STENT PLACEMENT  05/08/2012   Procedure: CYSTOSCOPY WITH RETROGRADE PYELOGRAM/URETERAL STENT PLACEMENT;  Surgeon: MoMarissa NestleMD;  Location: [facility];  Service: Urology;  Laterality: Right;   CYSTOSCOPY WITH RETROGRADE PYELOGRAM, URETEROSCOPY AND STENT PLACEMENT Right 05/04/2020   Procedure: CYSTOSCOPY WITH RIGHT RETROGRADE PYELOGRAM, RIGHT URETEROSCOPY AND RIGHT URETERAL STENT EXCHANGE;  Surgeon: McCleon GustinMD;  Location: [facility];  Service: Urology;  Laterality: Right;   CYSTOSCOPY WITH RETROGRADE PYELOGRAM, URETEROSCOPY AND STENT PLACEMENT Left 08/08/2020   Procedure: CYSTOSCOPY  WITH RETROGRADE PYELOGRAM, URETEROSCOPY WITH LASER  AND STENT PLACEMENT;  Surgeon: McCleon GustinMD;  [facility]: [facility];  Service: Urology;  Laterality: Left;   CYSTOSCOPY WITH RETROGRADE PYELOGRAM, URETEROSCOPY AND STENT PLACEMENT Right 11/08/2020   Procedure: CYSTOSCOPY WITH RIGHT RETROGRADE PYELOGRAM, RIGHT URETEROSCOPY AND STENT PLACEMENT;  Surgeon: McCleon GustinMD;  Location: [facility];  Service: Urology;  Laterality: Right;   CYSTOSCOPY WITH RETROGRADE PYELOGRAM, URETEROSCOPY AND STENT PLACEMENT Left 12/27/2020   Procedure: CYSTOSCOPY WITH LEFT RETROGRADE PYELOGRAM, LEFT URETEROSCOPY AND LEFT URETERAL STENT PLACEMENT;  Surgeon: McCleon GustinMD;  [facility]: [facility];  Service: Urology;  Laterality: Left;   CYSTOSCOPY WITH RETROGRADE PYELOGRAM, URETEROSCOPY AND STENT PLACEMENT Bilateral 03/16/2021   Procedure: CYSTOSCOPY WITH BILATERAL  RETROGRADE PYELOGRAM, BILATERAL URETEROSCOPY AND STENT PLACEMENT ON THE LEFT;  Surgeon: McCleon GustinMD;  [facility];  Service: Urology;  Laterality: Bilateral;   CYSTOSCOPY WITH RETROGRADE PYELOGRAM, URETEROSCOPY AND STENT PLACEMENT Left 06/28/2022   Procedure: CYSTOSCOPY WITH RETROGRADE PYELOGRAM, URETEROSCOPY AND STENT PLACEMENT;  Surgeon: McCleon GustinMD;  [facility];  Service: Urology;  Laterality: Left;   CYSTOSCOPY WITH STENT PLACEMENT Right 02/25/2020   Procedure: CYSTOSCOPY WITH RIGHT RETROGRADE RIGHT STENT PLACEMENT;  Surgeon: EsFestus AloeMD;  Location: [facility];  Service: Urology;  Laterality: Right;   ESOPHAGOGASTRODUODENOSCOPY (EGD) WITH PROPOFOL N/A 06/23/2020   Normal esophagus, multiple localized erosions were found in gastric antrum s/p biopsy, normal duodenum. Empiric dilation.Reactive gastropathy with erosions, negative  H.pylori, no dysplasia.     EXTRACORPOREAL SHOCK WAVE LITHOTRIPSY Right 10/11/2020   Procedure: EXTRACORPOREAL SHOCK WAVE LITHOTRIPSY (ESWL);  Surgeon: Cleon Gustin, MD;  [facility]:  [facility];  Service: Urology;  Laterality: Right;   FOOT SURGERY Right March, 2013   [facility]-removal of bone spur   HOLMIUM LASER APPLICATION  08/03/538   Procedure: HOLMIUM LASER LITHOTRIPSY RIGHT URETERAL CALCULUS;  Surgeon: Cleon Gustin, MD;  Location: [facility];  Service: Urology;;   HYSTEROSCOPY WITH THERMACHOICE  04/25/2012   Procedure: HYSTEROSCOPY WITH THERMACHOICE;  Surgeon: Florian Buff, MD;  Location: [facility];  Service: Gynecology;  Laterality: N/A;  total therapy time= 11 minutes 42 seconds; 34 ml D5w  in and 34 ml D5w out; temp =87 degrees F   LEFT HEART CATHETERIZATION WITH CORONARY ANGIOGRAM N/A 12/11/2012   Procedure: LEFT HEART CATHETERIZATION WITH CORONARY ANGIOGRAM;  Surgeon: Lorretta Harp, MD;  Location: [facility] CATH [facility];  Service: Cardiovascular;  Laterality: N/A;   LUMBAR WOUND DEBRIDEMENT N/A 12/14/2021   Procedure: LUMBAR HEMATOMA EVACUATION;  Surgeon: Judith Part, MD;  Location: [facility];  Service: Neurosurgery;  Laterality: N/A;   MALONEY DILATION N/A 06/23/2020   Procedure: Venia Minks DILATION;  Surgeon: Daneil Dolin, MD;  [facility];  Service: Endoscopy;  Laterality: N/A;   Sinus sergery  1988   [place]   STONE EXTRACTION WITH BASKET Right 11/08/2020   Procedure: STONE EXTRACTION WITH BASKET;  Surgeon: Cleon Gustin, MD;  Location: [facility];  Service: Urology;  Laterality: Right;   TRANSFORAMINAL LUMBAR INTERBODY FUSION (TLIF) WITH PEDICLE SCREW FIXATION 1 LEVEL N/A 12/05/2021   Procedure: Open Lumbar five-Sacral one Laminectomy/Transforaminal Lumbar Interbody Fusion/Posterolateral instrumented fusion;  Surgeon: Judith Part, MD;  Location: [facility];  Service: Neurosurgery;  Laterality: N/A;  3C   UMBILICAL HERNIA REPAIR N/A 03/25/2014   Procedure: UMBILICAL HERNIA REPAIR;  Surgeon: Scherry Ran, MD;  Location: [facility];  Service: General;  Laterality: N/A;   uterine ablation      Home Medications:  Allergies as of 09/21/2022        Reactions   Ciprofloxacin Hives, Swelling   Penicillins Anaphylaxis, Rash   Codeine Other (See Comments)   had hallicunations   Morphine Nausea And Vomiting, Other (See Comments)   recieved in ED due to [facility] and had multple doses - gave visual hallucinations and vomitting.   Sulfonamide Derivatives Other (See Comments)   REACTION: Unsure - childhood allergy   Vancomycin Itching        Medication List        Accurate as of September 21, 2022 12:10 PM. If you have any questions, ask your nurse or doctor.          albuterol 108 (90 Base) MCG/ACT inhaler Commonly known as: VENTOLIN HFA Inhale 2 puffs into the lungs every 6 (six) hours as needed for wheezing or shortness of breath.   blood glucose meter kit and supplies Use up to four times daily as directed.   buPROPion 150 MG 24 hr tablet Commonly known as: WELLBUTRIN XL Take 150 mg by mouth daily.   cyclobenzaprine 10 MG tablet Commonly known as: FLEXERIL Take 1 tablet (10 mg total) by mouth 3 (three) times daily as needed for muscle spasms.   freestyle lancets Use to test 4 times daily   furosemide 20 MG tablet Commonly known as: LASIX Take 20 mg by mouth 2 (two) times daily as needed for fluid or edema.  gabapentin 600 MG tablet Commonly known as: NEURONTIN Take 1 tablet (600 mg total) by mouth 3 (three) times daily.   glucose blood test strip Test up to 4 times a day   hydrOXYzine 50 MG capsule Commonly known as: VISTARIL TAKE 1 CAPSULE(50 MG) BY MOUTH AT BEDTIME   lisinopril-hydrochlorothiazide 20-12.5 MG tablet Commonly known as: ZESTORETIC Take 1 tablet by mouth daily.   meclizine 25 MG tablet Commonly known as: ANTIVERT TAKE 1 TABLET(25 MG) BY MOUTH THREE TIMES DAILY AS NEEDED FOR DIZZINESS OR NAUSEA   nitrofurantoin (macrocrystal-monohydrate) 100 MG capsule Commonly known as: Macrobid Take 1 capsule (100 mg total) by mouth 2 (two) times daily.   omeprazole 20 MG capsule Commonly  known as: PRILOSEC Take 1 capsule (20 mg total) by mouth daily.   oxybutynin 5 MG 24 hr tablet Commonly known as: DITROPAN-XL Take 5 mg by mouth at bedtime.   oxyCODONE 5 MG immediate release tablet Commonly known as: Oxy IR/ROXICODONE Take 1 tablet (5 mg total) by mouth every 4 (four) hours as needed (pain).   Potassium Citrate 15 MEQ (1620 MG) Tbcr Commonly known as: Urocit-K 15 Take 1 tablet by mouth in the morning and at bedtime.   promethazine 25 MG tablet Commonly known as: PHENERGAN TAKE 1 TABLET(25 MG) BY MOUTH EVERY 8 HOURS AS NEEDED FOR NAUSEA OR VOMITING   simvastatin 40 MG tablet Commonly known as: ZOCOR Take 1 tablet (40 mg total) by mouth every evening for cholesterol   Synjardy 12.5-500 MG Tabs Generic drug: Empagliflozin-metFORMIN HCl Take 1 tablet by mouth 2 (two) times daily. What changed: when to take this   Trulicity 3 HY/8.5OY Sopn Generic drug: Dulaglutide Inject 3 mg as directed once a week.        Allergies:  Allergies  Allergen Reactions   Ciprofloxacin Hives and Swelling   Penicillins Anaphylaxis and Rash   Codeine Other (See Comments)    had hallicunations   Morphine Nausea And Vomiting and Other (See Comments)    recieved in ED due to [facility] and had multple doses - gave visual hallucinations and vomitting.   Sulfonamide Derivatives Other (See Comments)    REACTION: Unsure - childhood allergy   Vancomycin Itching    Family History: Family History  Problem Relation Age of Onset   Hypertension Mother    Cancer Mother    Diabetes Mother    Stroke Mother    Diabetes Father    Depression Paternal Grandmother    Depression Paternal Grandfather    Cancer Paternal Uncle    Heart disease Other    Cancer Other    Diabetes Other    Achalasia Other    Anesthesia problems Neg Hx    Hypotension Neg Hx    Malignant hyperthermia Neg Hx    Pseudochol deficiency Neg Hx    Colon cancer Neg Hx    Colon polyps Neg Hx     Social  History:  reports that she has never smoked. She has never used smokeless tobacco. She reports that she does not drink alcohol and does not use drugs.  ROS: All other review of systems were reviewed and are negative except what is noted above in HPI  Physical Exam: BP 117/73   Pulse 83   LMP 07/16/2012   Constitutional:  Alert and oriented, No acute distress. HEENT: [place] AT, moist mucus membranes.  Trachea midline, no masses. Cardiovascular: No clubbing, cyanosis, or edema. Respiratory: Normal respiratory effort, no increased work of breathing. GI: Abdomen  is soft, nontender, nondistended, no abdominal masses GU: No CVA tenderness.  Lymph: No cervical or inguinal lymphadenopathy. Skin: No rashes, bruises or suspicious lesions. Neurologic: Grossly intact, no focal deficits, moving all 4 extremities. Psychiatric: Normal mood and affect.  Laboratory Data: Lab Results  Component Value Date   WBC 7.8 07/02/2022   HGB 14.2 07/02/2022   HCT 43.9 07/02/2022   MCV 85.4 07/02/2022   PLT 302 07/02/2022    Lab Results  Component Value Date   CREATININE 0.70 07/02/2022    No results found for: "PSA"  No results found for: "TESTOSTERONE"  Lab Results  Component Value Date   HGBA1C 6.5 06/07/2022    Urinalysis    Component Value Date/Time   COLORURINE STRAW (A) 07/02/2022 0717   APPEARANCEUR CLEAR 07/02/2022 0717   APPEARANCEUR Clear 04/16/2022 1533   LABSPEC 1.003 (L) 07/02/2022 0717   PHURINE 6.0 07/02/2022 0717   GLUCOSEU >=500 (A) 07/02/2022 0717   HGBUR LARGE (A) 07/02/2022 0717   HGBUR moderate 08/31/2008 0908   BILIRUBINUR NEGATIVE 07/02/2022 0717   BILIRUBINUR Negative 04/16/2022 1533   KETONESUR NEGATIVE 07/02/2022 0717   PROTEINUR NEGATIVE 07/02/2022 0717   UROBILINOGEN 0.2 12/14/2020 0835   UROBILINOGEN 0.2 09/21/2014 1055   NITRITE NEGATIVE 07/02/2022 0717   LEUKOCYTESUR NEGATIVE 07/02/2022 0717    Lab Results  Component Value Date   LABMICR Comment  04/16/2022   WBCUA 6-10 (A) 01/05/2022   LABEPIT 0-10 01/05/2022   BACTERIA NONE SEEN 07/02/2022    Pertinent Imaging: CT 08/12/2022: Images reviewed and discussed with the patient  Results for orders placed during the hospital encounter of 05/17/22  DG Abdomen 1 View  Narrative CLINICAL DATA:  Left-sided abdominal and flank pain. History of kidney stones.  EXAM: ABDOMEN - 1 VIEW  COMPARISON:  Ultrasound 04/12/2022. CT 02/01/2021  FINDINGS: Bowel gas pattern is normal. Cholecystectomy clips in place. Lower spinal fusion. No visible urinary tract calculi.  IMPRESSION: No visible urinary tract calculi. Sensitivity for small stones would be limited given patient's size and bowel contents.   Electronically Signed By: Nelson Chimes M.D. On: 05/17/2022 08:30  No results found for this or any previous visit.  No results found for this or any previous visit.  No results found for this or any previous visit.  Results for orders placed during the hospital encounter of 05/17/22  US Renal  Narrative CLINICAL DATA:  Left flank pain  EXAM: RENAL / URINARY TRACT ULTRASOUND COMPLETE  COMPARISON:  04/12/2022  FINDINGS: Right Kidney:  Renal measurements: 11.3 x 5.5 x 7.1 cm = volume: 231 mL. Echogenicity within normal limits. No mass or hydronephrosis visualized. Shadowing calculus in the inferior pole.  Left Kidney:  Renal measurements: 11.7 x 5.8 x 5.0 cm = volume: 178 mL. Echogenicity within normal limits. No mass or hydronephrosis visualized. Shadowing calculi in the superior pole.  Bladder:  Appears normal for degree of bladder distention.  Other:  None.  IMPRESSION: 1. No acute abnormality. No hydronephrosis. 2. Bilateral nonobstructive nephrolithiasis.   Electronically Signed By: Delanna Ahmadi M.D. On: 05/17/2022 09:23  No valid procedures specified. No results found for this or any previous visit.  Results for orders placed during the hospital  encounter of 08/10/22  CT RENAL STONE STUDY  Narrative CLINICAL DATA:  Left flank pain  EXAM: CT ABDOMEN AND PELVIS WITHOUT CONTRAST  TECHNIQUE: Multidetector CT imaging of the abdomen and pelvis was performed following the standard protocol without IV contrast.  RADIATION DOSE REDUCTION: This  exam was performed according to the departmental dose-optimization program which includes automated exposure control, adjustment of the mA and/or kV according to patient size and/or use of iterative reconstruction technique.  COMPARISON:  None Available.  FINDINGS: Lower chest: No acute abnormality.  Hepatobiliary: No focal liver abnormality is seen. Status post cholecystectomy. No biliary dilatation.  Pancreas: Unremarkable  Spleen: Unremarkable  Adrenals/Urinary Tract: The adrenal glands are unremarkable. The kidneys are normal in size and position. Scattered nonobstructing renal calculi are seen within the kidneys bilaterally measuring up to 5 mm within the lower pole of the right kidney and 4 mm within the interpolar region of the left kidney. No hydronephrosis. No ureteral calculi. The bladder is unremarkable.  Stomach/Bowel: Stomach is within normal limits. Appendix appears normal. No evidence of bowel wall thickening, distention, or inflammatory changes.  Vascular/Lymphatic: Aortic atherosclerosis. No enlarged abdominal or pelvic lymph nodes.  Reproductive: Uterus and bilateral adnexa are unremarkable.  Other: No abdominal wall hernia.  No abdominopelvic ascites.  Musculoskeletal: Mild subcutaneous infiltration within the right lower quadrant abdominal wall is nonspecific, possibly related to local trauma or inflammation. L5-S1 not lumbar fusion with instrumentation has been performed. No acute bone abnormality. No lytic or blastic bone lesion.  IMPRESSION: 1. No acute intra-abdominal pathology identified. No definite radiographic explanation for the patient's  reported symptoms. 2. Mild bilateral nonobstructing nephrolithiasis. No urolithiasis. No hydronephrosis.  Aortic Atherosclerosis (ICD10-I70.0).   Electronically Signed By: Fidela Salisbury M.D. On: 08/12/2022 22:01   Assessment & Plan:    1. Nephrolithiasis -We discussed the management of kidney stones. These options include observation, ureteroscopy, shockwave lithotripsy (ESWL) and percutaneous nephrolithotomy (PCNL). We discussed which options are relevant to the patient's stone(s). We discussed the natural history of kidney stones as well as the complications of untreated stones and the impact on quality of life without treatment as well as with each of the above listed treatments. We also discussed the efficacy of each treatment in its ability to clear the stone burden. With any of these management options I discussed the signs and symptoms of infection and the need for emergent treatment should these be experienced. For each option we discussed the ability of each procedure to clear the patient of their stone burden.   For observation I described the risks which include but are not limited to silent renal damage, life-threatening infection, need for emergent surgery, failure to pass stone and pain.   For ureteroscopy I described the risks which include bleeding, infection, damage to contiguous structures, positioning injury, ureteral stricture, ureteral avulsion, ureteral injury, need for prolonged ureteral stent, inability to perform ureteroscopy, need for an interval procedure, inability to clear stone burden, stent discomfort/pain, heart attack, stroke, pulmonary embolus and the inherent risks with general anesthesia.   For shockwave lithotripsy I described the risks which include arrhythmia, kidney contusion, kidney hemorrhage, need for transfusion, pain, inability to adequately break up stone, inability to pass stone fragments, Steinstrasse, infection associated with obstructing stones,  need for alternate surgical procedure, need for repeat shockwave lithotripsy, MI, CVA, PE and the inherent risks with anesthesia/conscious sedation.   For PCNL I described the risks including positioning injury, pneumothorax, hydrothorax, need for chest tube, inability to clear stone burden, renal laceration, arterial venous fistula or malformation, need for embolization of kidney, loss of kidney or renal function, need for repeat procedure, need for prolonged nephrostomy tube, ureteral avulsion, MI, CVA, PE and the inherent risks of general anesthesia.   - The patient would like to proceed  with bilateral ureteroscopic stone extraction   No follow-ups on file.  Nicolette Bang, MD  [facility] [place]

## 2022-09-21 NOTE — Progress Notes (Signed)
09/21/2022 12:10 PM   [name] 1966-07-25 [id number]  Referring provider: Lindell Spar, MD [address] [place],  [place] [address]  Followup nephrolithiasis   HPI: [name] is a 56yo here for followup for nephrolithiasis. She developed bilateral flank pain which is worse on the left. CT from 9/3 shows a 21m right lower pole calculus and a left 496mmid pole calculus. Sh remains on UrocitK and drinks 60-80oz of water daily.    PMH: Past Medical History:  Diagnosis Date   Diabetes mellitus    insulin pump 2013   Dysphagia 03/09/2020   Epidural hematoma ([address]   Hypercholesteremia    Hypertension    Incarcerated umbilical hernia 03/12/43/9753 Kidney stone    Kidney stones 08/31/2008   Qualifier: Diagnosis of  By: FeJonna MunroD, Cornelius     Neuropathy    Obesity    Obstructive sleep apnea    Palpitations    Shortness of breath     Surgical History: Past Surgical History:  Procedure Laterality Date   BIOPSY  06/23/2020   Procedure: BIOPSY;  Surgeon: RoDaneil DolinMD;  [facility];  Service: Endoscopy;;   CARDIAC CATHETERIZATION  12/11/2012   normal coronary arteries   CESAREAN SECTION  1994;2000   [place] [facility] WITH PROP[place]OL N/A 06/23/2020   large amount of stool in entire colon, prep inadequate.    CYSTOSCOPY W/ URETERAL STENT PLACEMENT  05/08/2012   Procedure: CYSTOSCOPY WITH RETROGRADE PYELOGRAM/URETERAL STENT PLACEMENT;  Surgeon: MoMarissa NestleMD;  Location: [facility];  Service: Urology;  Laterality: Right;   CYSTOSCOPY WITH RETROGRADE PYELOGRAM, URETEROSCOPY AND STENT PLACEMENT Right 05/04/2020   Procedure: CYSTOSCOPY WITH RIGHT RETROGRADE PYELOGRAM, RIGHT URETEROSCOPY AND RIGHT URETERAL STENT EXCHANGE;  Surgeon: McCleon GustinMD;  Location: [facility];  Service: Urology;  Laterality: Right;   CYSTOSCOPY WITH RETROGRADE PYELOGRAM, URETEROSCOPY AND STENT PLACEMENT Left 08/08/2020   Procedure: CYSTOSCOPY  WITH RETROGRADE PYELOGRAM, URETEROSCOPY WITH LASER  AND STENT PLACEMENT;  Surgeon: McCleon GustinMD;  [facility]: [facility];  Service: Urology;  Laterality: Left;   CYSTOSCOPY WITH RETROGRADE PYELOGRAM, URETEROSCOPY AND STENT PLACEMENT Right 11/08/2020   Procedure: CYSTOSCOPY WITH RIGHT RETROGRADE PYELOGRAM, RIGHT URETEROSCOPY AND STENT PLACEMENT;  Surgeon: McCleon GustinMD;  Location: [facility];  Service: Urology;  Laterality: Right;   CYSTOSCOPY WITH RETROGRADE PYELOGRAM, URETEROSCOPY AND STENT PLACEMENT Left 12/27/2020   Procedure: CYSTOSCOPY WITH LEFT RETROGRADE PYELOGRAM, LEFT URETEROSCOPY AND LEFT URETERAL STENT PLACEMENT;  Surgeon: McCleon GustinMD;  Location: [facility];  Service: Urology;  Laterality: Left;   CYSTOSCOPY WITH RETROGRADE PYELOGRAM, URETEROSCOPY AND STENT PLACEMENT Bilateral 03/16/2021   Procedure: CYSTOSCOPY WITH BILATERAL  RETROGRADE PYELOGRAM, BILATERAL URETEROSCOPY AND STENT PLACEMENT ON THE LEFT;  Surgeon: McCleon GustinMD;  [facility]: [facility];  Service: Urology;  Laterality: Bilateral;   CYSTOSCOPY WITH RETROGRADE PYELOGRAM, URETEROSCOPY AND STENT PLACEMENT Left 06/28/2022   Procedure: CYSTOSCOPY WITH RETROGRADE PYELOGRAM, URETEROSCOPY AND STENT PLACEMENT;  Surgeon: McCleon GustinMD;  [facility];  Service: Urology;  Laterality: Left;   CYSTOSCOPY WITH STENT PLACEMENT Right 02/25/2020   Procedure: CYSTOSCOPY WITH RIGHT RETROGRADE RIGHT STENT PLACEMENT;  Surgeon: EsFestus AloeMD;  Location: [facility];  Service: Urology;  Laterality: Right;   ESOPHAGOGASTRODUODENOSCOPY (EGD) WITH PROPOFOL N/A 06/23/2020   Normal esophagus, multiple localized erosions were found in gastric antrum s/p biopsy, normal duodenum. Empiric dilation.Reactive gastropathy with erosions, negative  H.pylori, no dysplasia.     EXTRACORPOREAL SHOCK WAVE LITHOTRIPSY Right 10/11/2020   Procedure: EXTRACORPOREAL SHOCK WAVE LITHOTRIPSY (ESWL);  Surgeon: Cleon Gustin, MD;  [facility]:  [facility];  Service: Urology;  Laterality: Right;   FOOT SURGERY Right March, 2013   [facility]-removal of bone spur   HOLMIUM LASER APPLICATION  08/03/538   Procedure: HOLMIUM LASER LITHOTRIPSY RIGHT URETERAL CALCULUS;  Surgeon: Cleon Gustin, MD;  Location: [facility];  Service: Urology;;   HYSTEROSCOPY WITH THERMACHOICE  04/25/2012   Procedure: HYSTEROSCOPY WITH THERMACHOICE;  Surgeon: Florian Buff, MD;  Location: [facility];  Service: Gynecology;  Laterality: N/A;  total therapy time= 11 minutes 42 seconds; 34 ml D5w  in and 34 ml D5w out; temp =87 degrees F   LEFT HEART CATHETERIZATION WITH CORONARY ANGIOGRAM N/A 12/11/2012   Procedure: LEFT HEART CATHETERIZATION WITH CORONARY ANGIOGRAM;  Surgeon: Lorretta Harp, MD;  Location: [facility] CATH [facility];  Service: Cardiovascular;  Laterality: N/A;   LUMBAR WOUND DEBRIDEMENT N/A 12/14/2021   Procedure: LUMBAR HEMATOMA EVACUATION;  Surgeon: Judith Part, MD;  Location: [facility];  Service: Neurosurgery;  Laterality: N/A;   MALONEY DILATION N/A 06/23/2020   Procedure: Venia Minks DILATION;  Surgeon: Daneil Dolin, MD;  [facility];  Service: Endoscopy;  Laterality: N/A;   Sinus sergery  1988   [place]   STONE EXTRACTION WITH BASKET Right 11/08/2020   Procedure: STONE EXTRACTION WITH BASKET;  Surgeon: Cleon Gustin, MD;  Location: [facility];  Service: Urology;  Laterality: Right;   TRANSFORAMINAL LUMBAR INTERBODY FUSION (TLIF) WITH PEDICLE SCREW FIXATION 1 LEVEL N/A 12/05/2021   Procedure: Open Lumbar five-Sacral one Laminectomy/Transforaminal Lumbar Interbody Fusion/Posterolateral instrumented fusion;  Surgeon: Judith Part, MD;  Location: [facility];  Service: Neurosurgery;  Laterality: N/A;  3C   UMBILICAL HERNIA REPAIR N/A 03/25/2014   Procedure: UMBILICAL HERNIA REPAIR;  Surgeon: Scherry Ran, MD;  Location: [facility];  Service: General;  Laterality: N/A;   uterine ablation      Home Medications:  Allergies as of 09/21/2022        Reactions   Ciprofloxacin Hives, Swelling   Penicillins Anaphylaxis, Rash   Codeine Other (See Comments)   had hallicunations   Morphine Nausea And Vomiting, Other (See Comments)   recieved in ED due to [facility] and had multple doses - gave visual hallucinations and vomitting.   Sulfonamide Derivatives Other (See Comments)   REACTION: Unsure - childhood allergy   Vancomycin Itching        Medication List        Accurate as of September 21, 2022 12:10 PM. If you have any questions, ask your nurse or doctor.          albuterol 108 (90 Base) MCG/ACT inhaler Commonly known as: VENTOLIN HFA Inhale 2 puffs into the lungs every 6 (six) hours as needed for wheezing or shortness of breath.   blood glucose meter kit and supplies Use up to four times daily as directed.   buPROPion 150 MG 24 hr tablet Commonly known as: WELLBUTRIN XL Take 150 mg by mouth daily.   cyclobenzaprine 10 MG tablet Commonly known as: FLEXERIL Take 1 tablet (10 mg total) by mouth 3 (three) times daily as needed for muscle spasms.   freestyle lancets Use to test 4 times daily   furosemide 20 MG tablet Commonly known as: LASIX Take 20 mg by mouth 2 (two) times daily as needed for fluid or edema.  gabapentin 600 MG tablet Commonly known as: NEURONTIN Take 1 tablet (600 mg total) by mouth 3 (three) times daily.   glucose blood test strip Test up to 4 times a day   hydrOXYzine 50 MG capsule Commonly known as: VISTARIL TAKE 1 CAPSULE(50 MG) BY MOUTH AT BEDTIME   lisinopril-hydrochlorothiazide 20-12.5 MG tablet Commonly known as: ZESTORETIC Take 1 tablet by mouth daily.   meclizine 25 MG tablet Commonly known as: ANTIVERT TAKE 1 TABLET(25 MG) BY MOUTH THREE TIMES DAILY AS NEEDED FOR DIZZINESS OR NAUSEA   nitrofurantoin (macrocrystal-monohydrate) 100 MG capsule Commonly known as: Macrobid Take 1 capsule (100 mg total) by mouth 2 (two) times daily.   omeprazole 20 MG capsule Commonly  known as: PRILOSEC Take 1 capsule (20 mg total) by mouth daily.   oxybutynin 5 MG 24 hr tablet Commonly known as: DITROPAN-XL Take 5 mg by mouth at bedtime.   oxyCODONE 5 MG immediate release tablet Commonly known as: Oxy IR/ROXICODONE Take 1 tablet (5 mg total) by mouth every 4 (four) hours as needed (pain).   Potassium Citrate 15 MEQ (1620 MG) Tbcr Commonly known as: Urocit-K 15 Take 1 tablet by mouth in the morning and at bedtime.   promethazine 25 MG tablet Commonly known as: PHENERGAN TAKE 1 TABLET(25 MG) BY MOUTH EVERY 8 HOURS AS NEEDED FOR NAUSEA OR VOMITING   simvastatin 40 MG tablet Commonly known as: ZOCOR Take 1 tablet (40 mg total) by mouth every evening for cholesterol   Synjardy 12.5-500 MG Tabs Generic drug: Empagliflozin-metFORMIN HCl Take 1 tablet by mouth 2 (two) times daily. What changed: when to take this   Trulicity 3 HY/8.5OY Sopn Generic drug: Dulaglutide Inject 3 mg as directed once a week.        Allergies:  Allergies  Allergen Reactions   Ciprofloxacin Hives and Swelling   Penicillins Anaphylaxis and Rash   Codeine Other (See Comments)    had hallicunations   Morphine Nausea And Vomiting and Other (See Comments)    recieved in ED due to [facility] and had multple doses - gave visual hallucinations and vomitting.   Sulfonamide Derivatives Other (See Comments)    REACTION: Unsure - childhood allergy   Vancomycin Itching    Family History: Family History  Problem Relation Age of Onset   Hypertension Mother    Cancer Mother    Diabetes Mother    Stroke Mother    Diabetes Father    Depression Paternal Grandmother    Depression Paternal Grandfather    Cancer Paternal Uncle    Heart disease Other    Cancer Other    Diabetes Other    Achalasia Other    Anesthesia problems Neg Hx    Hypotension Neg Hx    Malignant hyperthermia Neg Hx    Pseudochol deficiency Neg Hx    Colon cancer Neg Hx    Colon polyps Neg Hx     Social  History:  reports that she has never smoked. She has never used smokeless tobacco. She reports that she does not drink alcohol and does not use drugs.  ROS: All other review of systems were reviewed and are negative except what is noted above in HPI  Physical Exam: BP 117/73   Pulse 83   LMP 07/16/2012   Constitutional:  Alert and oriented, No acute distress. HEENT: [place] AT, moist mucus membranes.  Trachea midline, no masses. Cardiovascular: No clubbing, cyanosis, or edema. Respiratory: Normal respiratory effort, no increased work of breathing. GI: Abdomen  is soft, nontender, nondistended, no abdominal masses GU: No CVA tenderness.  Lymph: No cervical or inguinal lymphadenopathy. Skin: No rashes, bruises or suspicious lesions. Neurologic: Grossly intact, no focal deficits, moving all 4 extremities. Psychiatric: Normal mood and affect.  Laboratory Data: Lab Results  Component Value Date   WBC 7.8 07/02/2022   HGB 14.2 07/02/2022   HCT 43.9 07/02/2022   MCV 85.4 07/02/2022   PLT 302 07/02/2022    Lab Results  Component Value Date   CREATININE 0.70 07/02/2022    No results found for: "PSA"  No results found for: "TESTOSTERONE"  Lab Results  Component Value Date   HGBA1C 6.5 06/07/2022    Urinalysis    Component Value Date/Time   COLORURINE STRAW (A) 07/02/2022 0717   APPEARANCEUR CLEAR 07/02/2022 0717   APPEARANCEUR Clear 04/16/2022 1533   LABSPEC 1.003 (L) 07/02/2022 0717   PHURINE 6.0 07/02/2022 0717   GLUCOSEU >=500 (A) 07/02/2022 0717   HGBUR LARGE (A) 07/02/2022 0717   HGBUR moderate 08/31/2008 0908   BILIRUBINUR NEGATIVE 07/02/2022 0717   BILIRUBINUR Negative 04/16/2022 1533   KETONESUR NEGATIVE 07/02/2022 0717   PROTEINUR NEGATIVE 07/02/2022 0717   UROBILINOGEN 0.2 12/14/2020 0835   UROBILINOGEN 0.2 09/21/2014 1055   NITRITE NEGATIVE 07/02/2022 0717   LEUKOCYTESUR NEGATIVE 07/02/2022 0717    Lab Results  Component Value Date   LABMICR Comment  04/16/2022   WBCUA 6-10 (A) 01/05/2022   LABEPIT 0-10 01/05/2022   BACTERIA NONE SEEN 07/02/2022    Pertinent Imaging: CT 08/12/2022: Images reviewed and discussed with the patient  Results for orders placed during the hospital encounter of 05/17/22  DG Abdomen 1 View  Narrative CLINICAL DATA:  Left-sided abdominal and flank pain. History of kidney stones.  EXAM: ABDOMEN - 1 VIEW  COMPARISON:  Ultrasound 04/12/2022. CT 02/01/2021  FINDINGS: Bowel gas pattern is normal. Cholecystectomy clips in place. Lower spinal fusion. No visible urinary tract calculi.  IMPRESSION: No visible urinary tract calculi. Sensitivity for small stones would be limited given patient's size and bowel contents.   Electronically Signed By: Nelson Chimes M.D. On: 05/17/2022 08:30  No results found for this or any previous visit.  No results found for this or any previous visit.  No results found for this or any previous visit.  Results for orders placed during the hospital encounter of 05/17/22  US Renal  Narrative CLINICAL DATA:  Left flank pain  EXAM: RENAL / URINARY TRACT ULTRASOUND COMPLETE  COMPARISON:  04/12/2022  FINDINGS: Right Kidney:  Renal measurements: 11.3 x 5.5 x 7.1 cm = volume: 231 mL. Echogenicity within normal limits. No mass or hydronephrosis visualized. Shadowing calculus in the inferior pole.  Left Kidney:  Renal measurements: 11.7 x 5.8 x 5.0 cm = volume: 178 mL. Echogenicity within normal limits. No mass or hydronephrosis visualized. Shadowing calculi in the superior pole.  Bladder:  Appears normal for degree of bladder distention.  Other:  None.  IMPRESSION: 1. No acute abnormality. No hydronephrosis. 2. Bilateral nonobstructive nephrolithiasis.   Electronically Signed By: Delanna Ahmadi M.D. On: 05/17/2022 09:23  No valid procedures specified. No results found for this or any previous visit.  Results for orders placed during the hospital  encounter of 08/10/22  CT RENAL STONE STUDY  Narrative CLINICAL DATA:  Left flank pain  EXAM: CT ABDOMEN AND PELVIS WITHOUT CONTRAST  TECHNIQUE: Multidetector CT imaging of the abdomen and pelvis was performed following the standard protocol without IV contrast.  RADIATION DOSE REDUCTION: This  exam was performed according to the departmental dose-optimization program which includes automated exposure control, adjustment of the mA and/or kV according to patient size and/or use of iterative reconstruction technique.  COMPARISON:  None Available.  FINDINGS: Lower chest: No acute abnormality.  Hepatobiliary: No focal liver abnormality is seen. Status post cholecystectomy. No biliary dilatation.  Pancreas: Unremarkable  Spleen: Unremarkable  Adrenals/Urinary Tract: The adrenal glands are unremarkable. The kidneys are normal in size and position. Scattered nonobstructing renal calculi are seen within the kidneys bilaterally measuring up to 5 mm within the lower pole of the right kidney and 4 mm within the interpolar region of the left kidney. No hydronephrosis. No ureteral calculi. The bladder is unremarkable.  Stomach/Bowel: Stomach is within normal limits. Appendix appears normal. No evidence of bowel wall thickening, distention, or inflammatory changes.  Vascular/Lymphatic: Aortic atherosclerosis. No enlarged abdominal or pelvic lymph nodes.  Reproductive: Uterus and bilateral adnexa are unremarkable.  Other: No abdominal wall hernia.  No abdominopelvic ascites.  Musculoskeletal: Mild subcutaneous infiltration within the right lower quadrant abdominal wall is nonspecific, possibly related to local trauma or inflammation. L5-S1 not lumbar fusion with instrumentation has been performed. No acute bone abnormality. No lytic or blastic bone lesion.  IMPRESSION: 1. No acute intra-abdominal pathology identified. No definite radiographic explanation for the patient's  reported symptoms. 2. Mild bilateral nonobstructing nephrolithiasis. No urolithiasis. No hydronephrosis.  Aortic Atherosclerosis (ICD10-I70.0).   Electronically Signed By: Fidela Salisbury M.D. On: 08/12/2022 22:01   Assessment & Plan:    1. Nephrolithiasis -We discussed the management of kidney stones. These options include observation, ureteroscopy, shockwave lithotripsy (ESWL) and percutaneous nephrolithotomy (PCNL). We discussed which options are relevant to the patient's stone(s). We discussed the natural history of kidney stones as well as the complications of untreated stones and the impact on quality of life without treatment as well as with each of the above listed treatments. We also discussed the efficacy of each treatment in its ability to clear the stone burden. With any of these management options I discussed the signs and symptoms of infection and the need for emergent treatment should these be experienced. For each option we discussed the ability of each procedure to clear the patient of their stone burden.   For observation I described the risks which include but are not limited to silent renal damage, life-threatening infection, need for emergent surgery, failure to pass stone and pain.   For ureteroscopy I described the risks which include bleeding, infection, damage to contiguous structures, positioning injury, ureteral stricture, ureteral avulsion, ureteral injury, need for prolonged ureteral stent, inability to perform ureteroscopy, need for an interval procedure, inability to clear stone burden, stent discomfort/pain, heart attack, stroke, pulmonary embolus and the inherent risks with general anesthesia.   For shockwave lithotripsy I described the risks which include arrhythmia, kidney contusion, kidney hemorrhage, need for transfusion, pain, inability to adequately break up stone, inability to pass stone fragments, Steinstrasse, infection associated with obstructing stones,  need for alternate surgical procedure, need for repeat shockwave lithotripsy, MI, CVA, PE and the inherent risks with anesthesia/conscious sedation.   For PCNL I described the risks including positioning injury, pneumothorax, hydrothorax, need for chest tube, inability to clear stone burden, renal laceration, arterial venous fistula or malformation, need for embolization of kidney, loss of kidney or renal function, need for repeat procedure, need for prolonged nephrostomy tube, ureteral avulsion, MI, CVA, PE and the inherent risks of general anesthesia.   - The patient would like to proceed  with bilateral ureteroscopic stone extraction   No follow-ups on file.  Nicolette Bang, MD  [facility]

## 2022-09-24 NOTE — Patient Instructions (Addendum)
[name]  09/24/2022     @PREFPERIOPPHARMACY @   Your procedure is scheduled on  09/27/2022.   Report to [facility] at  [email or web address].   Call this number if you have problems the morning of surgery:  [phone]  If you experience any cold or flu symptoms such as cough, fever, chills, shortness of breath, etc. between now and your scheduled surgery, please notify us at the above number.   Remember:  Do not eat or drink after midnight.        Use your inhalers before you come and bring your rescue inhaler with you.      DO NOT take any medications for diabetes the morning of your procedure.     Take these medicines the morning of surgery with A SIP OF WATER           wellbutrin, gabapentin, antivert(if needed), macrobid, omeprazole, ditropan, oxycodone (if needed).     Do not wear jewelry, make-up or nail polish.  Do not wear lotions, powders, or perfumes, or deodorant.  Do not shave 48 hours prior to surgery.  Men may shave face and neck.  Do not bring valuables to the hospital.  [facility] is not responsible for any belongings or valuables.  Contacts, dentures or bridgework may not be worn into surgery.  Leave your suitcase in the car.  After surgery it may be brought to your room.  For patients admitted to the hospital, discharge time will be determined by your treatment team.  Patients discharged the day of surgery will not be allowed to drive home and must have someone with them for 24 hours.     Special instructions:   DO NOT smoke tobacco or vape for 24 hours before your procedure.  Please read over the following fact sheets that you were given. Coughing and Deep Breathing, Surgical Site Infection Prevention, Anesthesia Post-op Instructions, and Care and Recovery After Surgery       Ureteral Stent Implantation, Care After The following information offers guidance on how to care for yourself after your procedure. Your health care provider may also  give you more specific instructions. If you have problems or questions, contact your health care provider. What can I expect after the procedure? After the procedure, it is common to have: Nausea. Mild pain when you urinate. You may feel this pain in your lower back or lower abdomen. The pain should stop within a few minutes after you urinate. This pattern may last for up to 1 week. A small amount of blood in your urine for several days. Follow these instructions at home: Medicines Take over-the-counter and prescription medicines only as told by your health care provider. If you were prescribed antibiotics, take them as told by your health care provider. Do not stop using the antibiotic even if you start to feel better. If you were given a sedative during the procedure, it can affect you for several hours. Do not drive or operate machinery until your health care provider says that it is safe. Ask your health care provider if the medicine prescribed to you: Requires you to avoid driving or using machinery. Can cause constipation. You may need to take these actions to prevent or treat constipation: Take over-the-counter or prescription medicines. Eat foods that are high in fiber, such as beans, whole grains, and fresh fruits and vegetables. Limit foods that are high in fat and processed sugars, such as fried or sweet  foods. Activity Rest as told by your health care provider. Do not sit for a long time without moving. Get up to take short walks every 1-2 hours. This will improve blood flow and breathing. Ask for help if you feel weak or unsteady. Return to your normal activities as told by your health care provider. Ask your health care provider what activities are safe for you. General instructions  If you have a catheter: Follow instructions from your health care provider about taking care of your catheter and collection bag. Do not take baths, swim, or use a hot tub until your health care  provider approves. Ask your health care provider if you may take showers. You may only be allowed to take sponge baths. Drink enough fluid to keep your urine pale yellow. Do not use any products that contain nicotine or tobacco. These products include cigarettes, chewing tobacco, and vaping devices, such as e-cigarettes. These can delay healing after surgery. If you need help quitting, ask your health care provider. Keep all follow-up visits. Contact a health care provider if: You start passing blood clots, or you have more than a small amount of blood in your urine. You have pain that gets worse or does not get better with medicine, especially pain when you urinate. You have trouble urinating. You feel nauseous or you vomit again and again during a period of more than 2 days after the procedure. You have a fever. Get help right away if: You are passing blood clots that are 1 inch (2.5 cm) or larger in size. You are leaking urine (have incontinence), or you cannot urinate. The end of the stent comes out of your urethra. You have sudden, sharp, or severe pain in your abdomen or lower back. You have swelling or pain in your legs. You have trouble breathing. These symptoms may be an emergency. Get help right away. Call 911. Do not wait to see if the symptoms will go away. Do not drive yourself to the hospital. Summary After the procedure, it is common to have mild pain when you urinate that goes away within a few minutes after you urinate. This may last for up to 1 week. Take over-the-counter and prescription medicines only as told by your health care provider. Drink enough fluid to keep your urine pale yellow. Call your health care provider if you start passing blood clots, or you have more than a small amount of blood in your urine. This information is not intended to replace advice given to you by your health care provider. Make sure you discuss any questions you have with your health care  provider. Document Revised: 01/01/2022 Document Reviewed: 01/01/2022 Elsevier Patient Education  2023 [employer] General Anesthesia, Adult, Care After The following information offers guidance on how to care for yourself after your procedure. Your health care provider may also give you more specific instructions. If you have problems or questions, contact your health care provider. What can I expect after the procedure? After the procedure, it is common for people to: Have pain or discomfort at the IV site. Have nausea or vomiting. Have a sore throat or hoarseness. Have trouble concentrating. Feel cold or chills. Feel weak, sleepy, or tired (fatigue). Have soreness and body aches. These can affect parts of the body that were not involved in surgery. Follow these instructions at home: For the time period you were told by your health care provider:  Rest. Do not participate in activities where you could fall or become  injured. Do not drive or use machinery. Do not drink alcohol. Do not take sleeping pills or medicines that cause drowsiness. Do not make important decisions or sign legal documents. Do not take care of children on your own. General instructions Drink enough fluid to keep your urine pale yellow. If you have sleep apnea, surgery and certain medicines can increase your risk for breathing problems. Follow instructions from your health care provider about wearing your sleep device: Anytime you are sleeping, including during daytime naps. While taking prescription pain medicines, sleeping medicines, or medicines that make you drowsy. Return to your normal activities as told by your health care provider. Ask your health care provider what activities are safe for you. Take over-the-counter and prescription medicines only as told by your health care provider. Do not use any products that contain nicotine or tobacco. These products include cigarettes, chewing tobacco, and vaping  devices, such as e-cigarettes. These can delay incision healing after surgery. If you need help quitting, ask your health care provider. Contact a health care provider if: You have nausea or vomiting that does not get better with medicine. You vomit every time you eat or drink. You have pain that does not get better with medicine. You cannot urinate or have bloody urine. You develop a skin rash. You have a fever. Get help right away if: You have trouble breathing. You have chest pain. You vomit blood. These symptoms may be an emergency. Get help right away. Call 911. Do not wait to see if the symptoms will go away. Do not drive yourself to the hospital. Summary After the procedure, it is common to have a sore throat, hoarseness, nausea, vomiting, or to feel weak, sleepy, or fatigue. For the time period you were told by your health care provider, do not drive or use machinery. Get help right away if you have difficulty breathing, have chest pain, or vomit blood. These symptoms may be an emergency. This information is not intended to replace advice given to you by your health care provider. Make sure you discuss any questions you have with your health care provider. Document Revised: 02/23/2022 Document Reviewed: 02/23/2022 Elsevier Patient Education  2023 [employer].

## 2022-09-25 ENCOUNTER — Encounter (HOSPITAL_COMMUNITY)
Admission: RE | Admit: 2022-09-25 | Discharge: 2022-09-25 | Disposition: A | Discharge status: Home / Self Care | Payer: No Typology Code available for payment source | Source: Ambulatory Visit | Attending: Urology | Admitting: Urology

## 2022-09-25 ENCOUNTER — Encounter (HOSPITAL_COMMUNITY): Payer: Self-pay

## 2022-09-25 VITALS — BP 118/80 | HR 72 | Temp 97.8°F | Resp 18 | Ht <= 58 in

## 2022-09-25 DIAGNOSIS — Z01812 Encounter for preprocedural laboratory examination: Secondary | ICD-10-CM | POA: Diagnosis present

## 2022-09-25 DIAGNOSIS — E1169 Type 2 diabetes mellitus with other specified complication: Secondary | ICD-10-CM | POA: Insufficient documentation

## 2022-09-25 DIAGNOSIS — Z794 Long term (current) use of insulin: Secondary | ICD-10-CM | POA: Insufficient documentation

## 2022-09-25 HISTORY — DX: Gastro-esophageal reflux disease without esophagitis: K21.9

## 2022-09-25 HISTORY — DX: Dizziness and giddiness: R42

## 2022-09-25 HISTORY — DX: Personal history of urinary calculi: Z87.442

## 2022-09-25 LAB — HEMOGLOBIN A1C
Hgb A1c MFr Bld: 6.2 % — ABNORMAL HIGH (ref 4.8–5.6)
Mean Plasma Glucose: 131.24 mg/dL

## 2022-09-25 LAB — BASIC METABOLIC PANEL
Anion gap: 8 (ref 5–15)
BUN: 12 mg/dL (ref 6–20)
CO2: 28 mmol/L (ref 22–32)
Calcium: 8.6 mg/dL — ABNORMAL LOW (ref 8.9–10.3)
Chloride: 101 mmol/L (ref 98–111)
Creatinine, Ser: 0.72 mg/dL (ref 0.44–1.00)
GFR, Estimated: >60 mL/min (ref >60)
Glucose, Bld: 113 mg/dL — ABNORMAL HIGH (ref 70–99)
Potassium: 3.3 mmol/L — ABNORMAL LOW (ref 3.5–5.1)
Sodium: 137 mmol/L (ref 135–145)

## 2022-09-26 MED ORDER — DEXTROSE 5 % IV SOLN
300.0000 mg | INTRAVENOUS | Status: AC
  Administered 2022-09-27: 300 mg via INTRAVENOUS
  Filled 2022-09-26: qty 7.5

## 2022-09-27 ENCOUNTER — Ambulatory Visit (HOSPITAL_COMMUNITY)
Admission: RE | Admit: 2022-09-27 | Discharge: 2022-09-27 | Disposition: A | Discharge status: Home / Self Care | Payer: No Typology Code available for payment source | Attending: Urology | Admitting: Urology

## 2022-09-27 ENCOUNTER — Encounter (HOSPITAL_COMMUNITY): Payer: Self-pay | Admitting: Urology

## 2022-09-27 ENCOUNTER — Ambulatory Visit (HOSPITAL_COMMUNITY): Payer: No Typology Code available for payment source | Admitting: Certified Registered Nurse Anesthetist

## 2022-09-27 ENCOUNTER — Ambulatory Visit (HOSPITAL_COMMUNITY): Payer: No Typology Code available for payment source

## 2022-09-27 ENCOUNTER — Encounter (HOSPITAL_COMMUNITY)
Admission: RE | Disposition: A | Discharge status: Home / Self Care | Payer: Self-pay | Source: Home / Self Care | Attending: Urology

## 2022-09-27 ENCOUNTER — Ambulatory Visit (HOSPITAL_BASED_OUTPATIENT_CLINIC_OR_DEPARTMENT_OTHER): Payer: No Typology Code available for payment source | Admitting: Certified Registered Nurse Anesthetist

## 2022-09-27 DIAGNOSIS — I10 Essential (primary) hypertension: Secondary | ICD-10-CM

## 2022-09-27 DIAGNOSIS — F32A Depression, unspecified: Secondary | ICD-10-CM | POA: Insufficient documentation

## 2022-09-27 DIAGNOSIS — E1169 Type 2 diabetes mellitus with other specified complication: Secondary | ICD-10-CM

## 2022-09-27 DIAGNOSIS — N200 Calculus of kidney: Secondary | ICD-10-CM

## 2022-09-27 DIAGNOSIS — Z794 Long term (current) use of insulin: Secondary | ICD-10-CM | POA: Insufficient documentation

## 2022-09-27 DIAGNOSIS — E119 Type 2 diabetes mellitus without complications: Secondary | ICD-10-CM | POA: Diagnosis not present

## 2022-09-27 DIAGNOSIS — K219 Gastro-esophageal reflux disease without esophagitis: Secondary | ICD-10-CM | POA: Insufficient documentation

## 2022-09-27 DIAGNOSIS — G4733 Obstructive sleep apnea (adult) (pediatric): Secondary | ICD-10-CM | POA: Insufficient documentation

## 2022-09-27 DIAGNOSIS — E1140 Type 2 diabetes mellitus with diabetic neuropathy, unspecified: Secondary | ICD-10-CM | POA: Insufficient documentation

## 2022-09-27 HISTORY — PX: CYSTOSCOPY WITH RETROGRADE PYELOGRAM, URETEROSCOPY AND STENT PLACEMENT: SHX5789

## 2022-09-27 LAB — GLUCOSE, CAPILLARY: Glucose-Capillary: 106 mg/dL — ABNORMAL HIGH (ref 70–99)

## 2022-09-27 SURGERY — CYSTOURETEROSCOPY, WITH RETROGRADE PYELOGRAM AND STENT INSERTION
Anesthesia: General | Site: Ureter | Laterality: Bilateral

## 2022-09-27 MED ORDER — MIDAZOLAM HCL 5 MG/5ML IJ SOLN
INTRAMUSCULAR | Status: DC | PRN
  Administered 2022-09-27: 1 mg via INTRAVENOUS

## 2022-09-27 MED ORDER — WATER FOR IRRIGATION, STERILE IR SOLN
Status: DC | PRN
  Administered 2022-09-27: 500 mL

## 2022-09-27 MED ORDER — OXYCODONE HCL 5 MG PO TABS: 5.0000 mg | ORAL_TABLET | ORAL | 0 refills | Status: DC | PRN

## 2022-09-27 MED ORDER — OXYCODONE HCL 5 MG PO TABS
5.0000 mg | ORAL_TABLET | Freq: Once | ORAL | Status: AC | PRN
  Administered 2022-09-27: 5 mg via ORAL
  Filled 2022-09-27: qty 1

## 2022-09-27 MED ORDER — FENTANYL CITRATE (PF) 100 MCG/2ML IJ SOLN
INTRAMUSCULAR | Status: DC | PRN
  Administered 2022-09-27: 50 ug via INTRAVENOUS

## 2022-09-27 MED ORDER — ORAL CARE MOUTH RINSE: 15.0000 mL | Freq: Once | OROMUCOSAL | Status: AC

## 2022-09-27 MED ORDER — LACTATED RINGERS IV SOLN
INTRAVENOUS | Status: DC
  Administered 2022-09-27: 1000 mL via INTRAVENOUS

## 2022-09-27 MED ORDER — EPHEDRINE 5 MG/ML INJ
INTRAVENOUS | Status: AC
  Filled 2022-09-27: qty 10

## 2022-09-27 MED ORDER — FENTANYL CITRATE PF 50 MCG/ML IJ SOSY
25.0000 ug | PREFILLED_SYRINGE | INTRAMUSCULAR | Status: DC | PRN
  Administered 2022-09-27: 50 ug via INTRAVENOUS
  Filled 2022-09-27 (×2): qty 1

## 2022-09-27 MED ORDER — MIDAZOLAM HCL 2 MG/2ML IJ SOLN
INTRAMUSCULAR | Status: AC
  Filled 2022-09-27: qty 2

## 2022-09-27 MED ORDER — PROPOFOL 10 MG/ML IV BOLUS
INTRAVENOUS | Status: AC
  Filled 2022-09-27: qty 20

## 2022-09-27 MED ORDER — PHENYLEPHRINE 80 MCG/ML (10ML) SYRINGE FOR IV PUSH (FOR BLOOD PRESSURE SUPPORT)
PREFILLED_SYRINGE | INTRAVENOUS | Status: DC | PRN
  Administered 2022-09-27 (×2): 80 ug via INTRAVENOUS

## 2022-09-27 MED ORDER — FENTANYL CITRATE PF 50 MCG/ML IJ SOSY
PREFILLED_SYRINGE | INTRAMUSCULAR | Status: AC
  Administered 2022-09-27: 50 ug via INTRAVENOUS
  Filled 2022-09-27: qty 1

## 2022-09-27 MED ORDER — PROPOFOL 10 MG/ML IV BOLUS
INTRAVENOUS | Status: DC | PRN
  Administered 2022-09-27: 150 mg via INTRAVENOUS

## 2022-09-27 MED ORDER — FENTANYL CITRATE (PF) 100 MCG/2ML IJ SOLN
INTRAMUSCULAR | Status: AC
  Filled 2022-09-27: qty 2

## 2022-09-27 MED ORDER — SODIUM CHLORIDE 0.9 % IR SOLN
Status: DC | PRN
  Administered 2022-09-27 (×2): 3000 mL

## 2022-09-27 MED ORDER — INDAPAMIDE 2.5 MG PO TABS: 2.5000 mg | ORAL_TABLET | Freq: Every day | ORAL | 11 refills | Status: DC

## 2022-09-27 MED ORDER — ONDANSETRON HCL 4 MG/2ML IJ SOLN
INTRAMUSCULAR | Status: AC
  Filled 2022-09-27: qty 2

## 2022-09-27 MED ORDER — CHLORHEXIDINE GLUCONATE 0.12 % MT SOLN
15.0000 mL | Freq: Once | OROMUCOSAL | Status: AC
  Administered 2022-09-27: 15 mL via OROMUCOSAL

## 2022-09-27 MED ORDER — LIDOCAINE 2% (20 MG/ML) 5 ML SYRINGE
INTRAMUSCULAR | Status: DC | PRN
  Administered 2022-09-27: 80 mg via INTRAVENOUS

## 2022-09-27 MED ORDER — ROCURONIUM BROMIDE 100 MG/10ML IV SOLN
INTRAVENOUS | Status: DC | PRN
  Administered 2022-09-27: 40 mg via INTRAVENOUS

## 2022-09-27 MED ORDER — ONDANSETRON HCL 4 MG/2ML IJ SOLN
INTRAMUSCULAR | Status: DC | PRN
  Administered 2022-09-27: 4 mg via INTRAVENOUS

## 2022-09-27 MED ORDER — ONDANSETRON HCL 4 MG PO TABS: 4.0000 mg | ORAL_TABLET | Freq: Every day | ORAL | 1 refills | Status: DC | PRN

## 2022-09-27 MED ORDER — OXYCODONE HCL 5 MG/5ML PO SOLN: 5.0000 mg | Freq: Once | ORAL | Status: AC | PRN

## 2022-09-27 MED ORDER — ATROPINE SULFATE 0.4 MG/ML IV SOLN
INTRAVENOUS | Status: AC
  Filled 2022-09-27: qty 2

## 2022-09-27 MED ORDER — FENTANYL CITRATE PF 50 MCG/ML IJ SOSY
50.0000 ug | PREFILLED_SYRINGE | Freq: Once | INTRAMUSCULAR | Status: AC
  Administered 2022-09-27: 50 ug via INTRAVENOUS

## 2022-09-27 MED ORDER — SUGAMMADEX SODIUM 200 MG/2ML IV SOLN
INTRAVENOUS | Status: DC | PRN
  Administered 2022-09-27: 200 mg via INTRAVENOUS

## 2022-09-27 MED ORDER — ONDANSETRON HCL 4 MG/2ML IJ SOLN
4.0000 mg | Freq: Once | INTRAMUSCULAR | Status: AC | PRN
  Administered 2022-09-27: 4 mg via INTRAVENOUS
  Filled 2022-09-27: qty 2

## 2022-09-27 MED ORDER — DIATRIZOATE MEGLUMINE 30 % UR SOLN
URETHRAL | Status: DC | PRN
  Administered 2022-09-27: 17 mL via URETHRAL

## 2022-09-27 MED ORDER — LIDOCAINE HCL (PF) 2 % IJ SOLN
INTRAMUSCULAR | Status: AC
  Filled 2022-09-27: qty 20

## 2022-09-27 MED ORDER — DIATRIZOATE MEGLUMINE 30 % UR SOLN
URETHRAL | Status: AC
  Filled 2022-09-27: qty 100

## 2022-09-27 SURGICAL SUPPLY — 23 items
BAG DRAIN URO TABLE W/ADPT NS (BAG) ×3 IMPLANT
BAG DRN 8 ADPR NS SKTRN CSTL (BAG) ×2
BAG HAMPER (MISCELLANEOUS) ×3 IMPLANT
CATH INTERMIT  6FR 70CM (CATHETERS) ×3 IMPLANT
CLOTH BEACON ORANGE TIMEOUT ST (SAFETY) ×3 IMPLANT
EXTRACTOR STONE NITINOL NGAGE (UROLOGICAL SUPPLIES) ×1 IMPLANT
GLOVE BIO SURGEON STRL SZ8 (GLOVE) ×3 IMPLANT
GLOVE BIOGEL PI IND STRL 7.0 (GLOVE) ×6 IMPLANT
GOWN STRL REUS W/TWL LRG LVL3 (GOWN DISPOSABLE) ×3 IMPLANT
GOWN STRL REUS W/TWL XL LVL3 (GOWN DISPOSABLE) ×3 IMPLANT
GUIDEWIRE STR DUAL SENSOR (WIRE) ×3 IMPLANT
GUIDEWIRE STR ZIPWIRE 035X150 (MISCELLANEOUS) ×3 IMPLANT
IV NS IRRIG 3000ML ARTHROMATIC (IV SOLUTION) ×6 IMPLANT
KIT TURNOVER CYSTO (KITS) ×3 IMPLANT
MANIFOLD NEPTUNE II (INSTRUMENTS) ×3 IMPLANT
PACK CYSTO (CUSTOM PROCEDURE TRAY) ×3 IMPLANT
PAD ARMBOARD 7.5X6 YLW CONV (MISCELLANEOUS) ×3 IMPLANT
SHEATH URETERAL 12FRX35CM (MISCELLANEOUS) ×1 IMPLANT
STENT URET 6FRX24 CONTOUR (STENTS) ×1 IMPLANT
SYR 10ML LL (SYRINGE) ×3 IMPLANT
SYR CONTROL 10ML LL (SYRINGE) ×3 IMPLANT
TOWEL OR 17X26 4PK STRL BLUE (TOWEL DISPOSABLE) ×3 IMPLANT
WATER STERILE IRR 500ML POUR (IV SOLUTION) ×3 IMPLANT

## 2022-09-27 NOTE — Transfer of Care (Signed)
Immediate Anesthesia Transfer of Care Note  Patient: [name]  Procedure(s) Performed: CYSTOSCOPY WITH BILATERAL RETROGRADE PYELOGRAM, URETEROSCOPY AND STENT PLACEMENT (Bilateral: Ureter)  Patient Location: PACU  Anesthesia Type:General  Level of Consciousness: awake  Airway & Oxygen Therapy: Patient Spontanous Breathing  Post-op Assessment: Report given to RN and Post -op Vital signs reviewed and stable  Post vital signs: Reviewed and stable  Last Vitals:  Vitals Value Taken Time  BP 136/78 09/27/22 1027  Temp    Pulse 83 09/27/22 1029  Resp 17 09/27/22 1029  SpO2 96 % 09/27/22 1029  Vitals shown include unvalidated device data.  Last Pain:  Vitals:   09/27/22 0808  TempSrc: Oral  PainSc: 9       Patients Stated Pain Goal: 9 (95/28/41 3244)  Complications: No notable events documented.

## 2022-09-27 NOTE — Interval H&P Note (Signed)
History and Physical Interval Note:  09/27/2022 7:57 AM  [name]  has presented today for surgery, with the diagnosis of bilateral renal calculi.  The various methods of treatment have been discussed with the patient and family. After consideration of risks, benefits and other options for treatment, the patient has consented to  Procedure(s) with comments: CYSTOSCOPY WITH BILATERAL RETROGRADE PYELOGRAM, URETEROSCOPY AND STENT PLACEMENT (Bilateral) - pt knows to arrive at 6:00 HOLMIUM LASER APPLICATION (Bilateral) as a surgical intervention.  The patient's history has been reviewed, patient examined, no change in status, stable for surgery.  I have reviewed the patient's chart and labs.  Questions were answered to the patient's satisfaction.     Nicolette Bang

## 2022-09-27 NOTE — Anesthesia Postprocedure Evaluation (Signed)
Anesthesia Post Note  Patient: [name]  Procedure(s) Performed: CYSTOSCOPY WITH BILATERAL RETROGRADE PYELOGRAM, URETEROSCOPY AND STENT PLACEMENT (Bilateral: Ureter)  Patient location during evaluation: Phase II Anesthesia Type: General Level of consciousness: awake Pain management: pain level controlled Vital Signs Assessment: post-procedure vital signs reviewed and stable Respiratory status: spontaneous breathing and respiratory function stable Cardiovascular status: blood pressure returned to baseline and stable Postop Assessment: no headache and no apparent nausea or vomiting Anesthetic complications: no Comments: Late entry   No notable events documented.   Last Vitals:  Vitals:   09/27/22 1100 09/27/22 1114  BP: 124/71 124/71  Pulse: 66 64  Resp: 19 17  Temp:  36.8 C  SpO2: 95% 99%    Last Pain:  Vitals:   09/27/22 1114  TempSrc: Oral  PainSc: [name]

## 2022-09-27 NOTE — Op Note (Signed)
.  Preoperative diagnosis: bilateral renal calculi  Postoperative diagnosis: Same  Procedure: 1 cystoscopy 2. bilateralretrograde pyelography 3.  Intraoperative fluoroscopy, under one hour, with interpretation 4.  Bilateral ureteroscopic stone manipulation with basket extraction 5.  Right 6x24 JJ ureteral stent placement  Attending: Rosie Fate  Anesthesia: General  Estimated blood loss: None  Drains: right 6 x 24 JJ ureteral stent with tether  Specimens: stone for analysis  Antibiotics: gentamicin  Findings: bilateral lower pole renal calculi. No hydronephrosis. No masses/lesions in the bladder. Ureteral orifices in normal anatomic location.  Indications: Patient is a 56 year old female with a history of bilateral renal calculi and bilateral flank pain. After discussing treatment options, they decided proceed with bilateral ureteroscopic stone manipulation.  Procedure in detail: The patient was brought to the operating room and a brief timeout was done to ensure correct patient, correct procedure, correct site.  General anesthesia was administered patient was placed in dorsal lithotomy position.  Her genitalia was then prepped and draped in usual sterile fashion.  A rigid 55 French cystoscope was passed in the urethra and the bladder.  Bladder was inspected free masses or lesions.  the ureteral orifices were in the normal orthotopic locations. a 6 french ureteral catheter was then instilled into the left ureteral orifice.  a gentle retrograde was obtained and findings noted above. We then advanced a zipwire through the catheter and up to the renal pelvis.  we then removed the cystoscope and cannulated the left ureteral orifice with a semirigid ureteroscope.  We located no stone in the ureter. We then placed a sensor wire up to the renal pelvis. We removed the scope and advanced a 11/13 x 36cm access sheath up to the renal pelvis. We then used the flexible ureteroscope to perform  nephroscopy. We located calculi in the lower pole which were removed with an NGage basket. Once the stone were removed we then removed the access sheath under direct vision and noted to injury to the ureter.  We elected not to place a stent.  We then turned out attention to the right side. a 6 french ureteral catheter was then instilled into the right ureteral orifice.  a gentle retrograde was obtained and findings noted above. We then advanced a zipwire through the catheter and up to the renal pelvis.  we then removed the cystoscope and cannulated the right ureteral orifice with a semirigid ureteroscope.  We located no stone in the ureter. We then placed a sensor wire up to the renal pelvis. We removed the scope and advanced a 11/13 x 36cm access sheath up to the renal pelvis. We then used the flexible ureteroscope to perform nephroscopy. We located calculi in the lower pole which were removed with an NGage basket. Once the stone were removed we then removed the access sheath under direct vision and noted to injury to the ureter. we then placed a 6 x 26 double-j ureteral stent over the original zip wire.  We then removed the wire and good coil was noted in the the renal pelvis under fluoroscopy and the bladder under direct vision.   the bladder was then drained and this concluded the procedure which was well tolerated by patient.  Complications: None  Condition: Stable, extubated, transferred to PACU  Plan: Patient is to be discharged home as to follow-up in 2 weeks. She is to remove her stent by pulling the tehter in 72 hours

## 2022-09-27 NOTE — Anesthesia Procedure Notes (Signed)
Procedure Name: Intubation Date/Time: 09/27/2022 9:27 AM  Performed by: Hewitt Blade, CRNAPre-anesthesia Checklist: Patient identified, Emergency Drugs available, Suction available and Patient being monitored Patient Re-evaluated:Patient Re-evaluated prior to induction Oxygen Delivery Method: Circle system utilized Preoxygenation: Pre-oxygenation with 100% oxygen Induction Type: IV induction Ventilation: Mask ventilation without difficulty Laryngoscope Size: Mac and 3 Grade View: Grade I Tube type: Oral Tube size: 7.0 mm Number of attempts: 1 Airway Equipment and Method: Stylet Placement Confirmation: ETT inserted through vocal cords under direct vision, positive ETCO2 and breath sounds checked- equal and bilateral Secured at: 21 cm Tube secured with: Tape Dental Injury: Teeth and Oropharynx as per pre-operative assessment

## 2022-09-27 NOTE — Anesthesia Preprocedure Evaluation (Signed)
Anesthesia Evaluation  Patient identified by MRN, date of birth, ID band Patient awake    Reviewed: Allergy & Precautions, H&P , NPO status , Patient's Chart, lab work & pertinent test results, reviewed documented beta blocker date and time   Airway Mallampati: II  TM Distance: >3 FB Neck ROM: full    Dental no notable dental hx.    Pulmonary sleep apnea ,    Pulmonary exam normal breath sounds clear to auscultation       Cardiovascular Exercise Tolerance: Good hypertension, negative cardio ROS   Rhythm:regular Rate:Normal     Neuro/Psych  Headaches, PSYCHIATRIC DISORDERS Depression  Neuromuscular disease    GI/Hepatic Neg liver ROS, GERD  Medicated,  Endo/Other  diabetes, Type 2, Insulin DependentMorbid obesity  Renal/GU Renal disease  negative genitourinary   Musculoskeletal   Abdominal   Peds  Hematology negative hematology ROS (+)   Anesthesia Other Findings   Reproductive/Obstetrics negative OB ROS                             Anesthesia Physical Anesthesia Plan  ASA: 3  Anesthesia Plan: General   Post-op Pain Management:    Induction:   PONV Risk Score and Plan: Ondansetron  Airway Management Planned:   Additional Equipment:   Intra-op Plan:   Post-operative Plan:   Informed Consent: I have reviewed the patients History and Physical, chart, labs and discussed the procedure including the risks, benefits and alternatives for the proposed anesthesia with the patient or authorized representative who has indicated his/her understanding and acceptance.     Dental Advisory Given  Plan Discussed with: CRNA  Anesthesia Plan Comments:         Anesthesia Quick Evaluation

## 2022-10-02 ENCOUNTER — Encounter (HOSPITAL_COMMUNITY): Payer: Self-pay | Admitting: Urology

## 2022-10-04 ENCOUNTER — Other Ambulatory Visit: Payer: Self-pay | Admitting: Internal Medicine

## 2022-10-04 DIAGNOSIS — F5101 Primary insomnia: Secondary | ICD-10-CM

## 2022-10-04 LAB — CALCULI, WITH PHOTOGRAPH (CLINICAL LAB)
Calcium Oxalate Dihydrate: 20 %
Calcium Oxalate Monohydrate: 75 %
Hydroxyapatite: 5 %
Weight Calculi: 29 mg

## 2022-10-05 ENCOUNTER — Ambulatory Visit: Payer: No Typology Code available for payment source | Admitting: Urology

## 2022-10-08 ENCOUNTER — Encounter: Payer: Self-pay | Admitting: Internal Medicine

## 2022-10-08 ENCOUNTER — Ambulatory Visit (INDEPENDENT_AMBULATORY_CARE_PROVIDER_SITE_OTHER): Payer: No Typology Code available for payment source | Admitting: Internal Medicine

## 2022-10-08 VITALS — BP 118/64 | HR 80 | Resp 18 | Ht <= 58 in | Wt 174.2 lb

## 2022-10-08 DIAGNOSIS — Z23 Encounter for immunization: Secondary | ICD-10-CM | POA: Diagnosis not present

## 2022-10-08 DIAGNOSIS — I10 Essential (primary) hypertension: Secondary | ICD-10-CM

## 2022-10-08 DIAGNOSIS — E1169 Type 2 diabetes mellitus with other specified complication: Secondary | ICD-10-CM

## 2022-10-08 DIAGNOSIS — E782 Mixed hyperlipidemia: Secondary | ICD-10-CM

## 2022-10-08 DIAGNOSIS — N760 Acute vaginitis: Secondary | ICD-10-CM

## 2022-10-08 DIAGNOSIS — F5101 Primary insomnia: Secondary | ICD-10-CM | POA: Diagnosis not present

## 2022-10-08 DIAGNOSIS — Z794 Long term (current) use of insulin: Secondary | ICD-10-CM

## 2022-10-08 DIAGNOSIS — N200 Calculus of kidney: Secondary | ICD-10-CM

## 2022-10-08 MED ORDER — QUVIVIQ 50 MG PO TABS: 1.0000 | ORAL_TABLET | Freq: Every day | ORAL | 3 refills | Status: DC

## 2022-10-08 MED ORDER — FLUCONAZOLE 150 MG PO TABS: 150.0000 mg | ORAL_TABLET | ORAL | 0 refills | Status: DC

## 2022-10-08 NOTE — Progress Notes (Unsigned)
Established Patient Office Visit  Subjective:  Patient ID: [name], female    DOB: 04-Sep-1966  Age: 56 y.o. MRN: [id number]  CC:  Chief Complaint  Patient presents with   Follow-up    Follow up DM and HTN pt still not sleeping very well    HPI [name] is a 56 y.o. female with past medical history of DM, HTN, OSA, lumbar spinal stenosis and insomnia who presents for f/u of her chronic medical conditions.  HTN: BP is well-controlled. Takes medications regularly. Patient denies headache, dizziness, chest pain, dyspnea or palpitations.   Type II DM: She has been taking Synjardy and Trulicity.  Her HbA1c has improved to 6.2 now.  She states that her home blood glucose has been ranging around 130.  She has lost about 7 pounds since since the last visit in 46/80 with Trulicity now.  She denies any polyuria, polydipsia or polyphagia.  She reports perineal area itching for the last few days. She denies any fever, chills or pelvic pain currently.  Of note, she is on Synjardy for type II DM.  She still complains of insomnia despite taking Vistaril.  She has difficulty initiating and maintaining sleep.  She used to take BZD in the past.  She recently had cystoscopy with stent placement for retrieval of recurrent nephrolithiasis.  Followed by urology.  She received second dose of Shingrix vaccine in the office today.  Past Medical History:  Diagnosis Date   Diabetes mellitus    insulin pump 2013   Dysphagia 03/09/2020   Epidural hematoma ([place]) 12/14/2021   GERD (gastroesophageal reflux disease)    History of kidney stones    Hypercholesteremia    Hypertension    Incarcerated umbilical hernia 32/11/2481   Kidney stone    Kidney stones 08/31/2008   Qualifier: Diagnosis of  By: Jonna Munro MD, Cornelius     Neuropathy    Obesity    Palpitations    Shortness of breath    Vertigo     Past Surgical History:  Procedure Laterality Date   BIOPSY  06/23/2020   Procedure: BIOPSY;   Surgeon: Daneil Dolin, MD;  [facility];  Service: Endoscopy;;   CARDIAC CATHETERIZATION  12/11/2012   normal coronary arteries   CESAREAN SECTION  1994;2000   [place]   COLONOSCOPY WITH PROPOFOL N/A 06/23/2020   large amount of stool in entire colon, prep inadequate.    CYSTOSCOPY W/ URETERAL STENT PLACEMENT  05/08/2012   Procedure: CYSTOSCOPY WITH RETROGRADE PYELOGRAM/URETERAL STENT PLACEMENT;  Surgeon: Marissa Nestle, MD;  Location: [facility];  Service: Urology;  Laterality: Right;   CYSTOSCOPY WITH RETROGRADE PYELOGRAM, URETEROSCOPY AND STENT PLACEMENT Right 05/04/2020   Procedure: CYSTOSCOPY WITH RIGHT RETROGRADE PYELOGRAM, RIGHT URETEROSCOPY AND RIGHT URETERAL STENT EXCHANGE;  Surgeon: Cleon Gustin, MD;  Location: [facility];  Service: Urology;  Laterality: Right;   CYSTOSCOPY WITH RETROGRADE PYELOGRAM, URETEROSCOPY AND STENT PLACEMENT Left 08/08/2020   Procedure: CYSTOSCOPY WITH RETROGRADE PYELOGRAM, URETEROSCOPY WITH LASER  AND STENT PLACEMENT;  Surgeon: Cleon Gustin, MD;  Location: [facility];  Service: Urology;  Laterality: Left;   CYSTOSCOPY WITH RETROGRADE PYELOGRAM, URETEROSCOPY AND STENT PLACEMENT Right 11/08/2020   Procedure: CYSTOSCOPY WITH RIGHT RETROGRADE PYELOGRAM, RIGHT URETEROSCOPY AND STENT PLACEMENT;  Surgeon: Cleon Gustin, MD;  [facility];  Service: Urology;  Laterality: Right;   CYSTOSCOPY WITH RETROGRADE PYELOGRAM, URETEROSCOPY AND STENT PLACEMENT Left 12/27/2020   Procedure: CYSTOSCOPY WITH  LEFT RETROGRADE PYELOGRAM, LEFT URETEROSCOPY AND LEFT URETERAL STENT PLACEMENT;  Surgeon: Cleon Gustin, MD;  [facility];  Service: Urology;  Laterality: Left;   CYSTOSCOPY WITH RETROGRADE PYELOGRAM, URETEROSCOPY AND STENT PLACEMENT Bilateral 03/16/2021   Procedure: CYSTOSCOPY WITH BILATERAL  RETROGRADE PYELOGRAM, BILATERAL URETEROSCOPY AND STENT PLACEMENT ON THE LEFT;  Surgeon: Cleon Gustin, MD;  [facility];   Service: Urology;  Laterality: Bilateral;   CYSTOSCOPY WITH RETROGRADE PYELOGRAM, URETEROSCOPY AND STENT PLACEMENT Left 06/28/2022   Procedure: CYSTOSCOPY WITH RETROGRADE PYELOGRAM, URETEROSCOPY AND STENT PLACEMENT;  Surgeon: Cleon Gustin, MD;  [facility];  Service: Urology;  Laterality: Left;   CYSTOSCOPY WITH RETROGRADE PYELOGRAM, URETEROSCOPY AND STENT PLACEMENT Bilateral 09/27/2022   Procedure: CYSTOSCOPY WITH BILATERAL RETROGRADE PYELOGRAM, URETEROSCOPY AND STENT PLACEMENT;  Surgeon: Cleon Gustin, MD;  [facility];  Service: Urology;  Laterality: Bilateral;  pt knows to arrive at 6:00   [place] Right 02/25/2020   Procedure: CYSTOSCOPY WITH RIGHT RETROGRADE RIGHT STENT PLACEMENT;  Surgeon: Festus Aloe, MD;  [facility];  Service: Urology;  Laterality: Right;   ESOPHAGOGASTRODUODENOSCOPY (EGD) WITH PROPOFOL N/A 06/23/2020   Normal esophagus, multiple localized erosions were found in gastric antrum s/p biopsy, normal duodenum. Empiric dilation.Reactive gastropathy with erosions, negative H.pylori, no dysplasia.     EXTRACORPOREAL SHOCK WAVE LITHOTRIPSY Right 10/11/2020   Procedure: EXTRACORPOREAL SHOCK WAVE LITHOTRIPSY (ESWL);  Surgeon: Cleon Gustin, MD;  [facility];  Service: Urology;  Laterality: Right;   FOOT SURGERY Right March, 2013   [facility]-removal of bone spur   HOLMIUM LASER APPLICATION  0/08/3817   Procedure: HOLMIUM LASER LITHOTRIPSY RIGHT URETERAL CALCULUS;  Surgeon: Cleon Gustin, MD;  [facility]: [facility];  Service: Urology;;   HYSTEROSCOPY WITH THERMACHOICE  04/25/2012   Procedure: HYSTEROSCOPY WITH THERMACHOICE;  Surgeon: Florian Buff, MD;  Location: [facility];  Service: Gynecology;  Laterality: N/A;  total therapy time= 11 minutes 42 seconds; 34 ml D5w  in and 34 ml D5w out; temp =87 degrees F   LEFT HEART CATHETERIZATION WITH CORONARY ANGIOGRAM N/A 12/11/2012   Procedure: LEFT HEART CATHETERIZATION WITH  CORONARY ANGIOGRAM;  Surgeon: Lorretta Harp, MD;  Location: [facility];  Service: Cardiovascular;  Laterality: N/A;   LUMBAR WOUND DEBRIDEMENT N/A 12/14/2021   Procedure: LUMBAR HEMATOMA EVACUATION;  Surgeon: Judith Part, MD;  Location: [place];  Service: Neurosurgery;  Laterality: N/A;   MALONEY DILATION N/A 06/23/2020   Procedure: Venia Minks DILATION;  Surgeon: Daneil Dolin, MD;  [facility];  Service: Endoscopy;  Laterality: N/A;   Sinus sergery  1988   [place]   STONE EXTRACTION WITH BASKET Right 11/08/2020   Procedure: STONE EXTRACTION WITH BASKET;  Surgeon: Cleon Gustin, MD;  Location: [facility];  Service: Urology;  Laterality: Right;   TRANSFORAMINAL LUMBAR INTERBODY FUSION (TLIF) WITH PEDICLE SCREW FIXATION 1 LEVEL N/A 12/05/2021   Procedure: Open Lumbar five-Sacral one Laminectomy/Transforaminal Lumbar Interbody Fusion/Posterolateral instrumented fusion;  Surgeon: Judith Part, MD;  Location: [place];  Service: Neurosurgery;  Laterality: N/A;  3C   UMBILICAL HERNIA REPAIR N/A 03/25/2014   Procedure: UMBILICAL HERNIA REPAIR;  Surgeon: Scherry Ran, MD;  Location: [facility];  Service: General;  Laterality: N/A;   uterine ablation      Family History  Problem Relation Age of Onset   Hypertension Mother    Cancer Mother    Diabetes Mother    Stroke Mother  Diabetes Father    Depression Paternal Grandmother    Depression Paternal Grandfather    Cancer Paternal Uncle    Heart disease Other    Cancer Other    Diabetes Other    Achalasia Other    Anesthesia problems Neg Hx    Hypotension Neg Hx    Malignant hyperthermia Neg Hx    Pseudochol deficiency Neg Hx    Colon cancer Neg Hx    Colon polyps Neg Hx     Social History   Socioeconomic History   Marital status: Married    Spouse name: Not on file   Number of children: 2   Years of education: college   Highest education level: Not on file  Occupational History   Occupation: CNA     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: [employer]   Occupation: Quarry manager- at [employer]  Tobacco Use   Smoking status: Never   Smokeless tobacco: Never  Vaping Use   Vaping Use: Never used  Substance and Sexual Activity   Alcohol use: No   Drug use: No   Sexual activity: Not Currently    Partners: Male    Birth control/protection: Post-menopausal    Comment: ablation  Other Topics Concern   Not on file  Social History Narrative   Regular exercise: walks Caffeine use:    Social Determinants of Health   Financial Resource Strain: Low Risk  (01/11/2022)   Overall Financial Resource Strain (CARDIA)    Difficulty of Paying Living Expenses: Not hard at all  Food Insecurity: Food Insecurity Present (01/11/2022)   Hunger Vital Sign    Worried About Running Out of Food in the Last Year: Sometimes true    Ran Out of Food in the Last Year: Never true  Transportation Needs: No Transportation Needs (01/11/2022)   PRAPARE - Hydrologist (Medical): No    Lack of Transportation (Non-Medical): No  Physical Activity: Insufficiently Active (01/11/2022)   Exercise Vital Sign    Days of Exercise per Week: 2 days    Minutes of Exercise per Session: 20 min  Stress: Stress Concern Present (01/11/2022)   [place]    Feeling of Stress : To some extent  Social Connections: Moderately Integrated (01/11/2022)   Social Connection and Isolation Panel [NHANES]    Frequency of Communication with Friends and Family: Three times a week    Frequency of Social Gatherings with Friends and Family: Twice a week    Attends Religious Services: 1 to 4 times per year    Active Member of [employer] or Organizations: No    Attends Archivist Meetings: Never    Marital Status: Married  Human resources officer Violence: Not At Risk (01/11/2022)   Humiliation, Afraid, Rape, and Kick questionnaire    Fear of Current or Ex-Partner:  No    Emotionally Abused: No    Physically Abused: No    Sexually Abused: No    Outpatient Medications Prior to Visit  Medication Sig Dispense Refill   albuterol (VENTOLIN HFA) 108 (90 Base) MCG/ACT inhaler Inhale 2 puffs into the lungs every 6 (six) hours as needed for wheezing or shortness of breath.      blood glucose meter kit and supplies Use up to four times daily as directed. 1 each 0   cyclobenzaprine (FLEXERIL) 10 MG tablet Take 1 tablet (10 mg total) by mouth 3 (three) times daily as needed  for muscle spasms. 30 tablet 0   Dulaglutide (TRULICITY) 3 TI/4.5YK SOPN Inject 3 mg as directed once a week. 6 mL 1   Empagliflozin-metFORMIN HCl (SYNJARDY) 12.5-500 MG TABS Take 1 tablet by mouth 2 (two) times daily. (Patient taking differently: Take 1 tablet by mouth daily.) 60 tablet 2   furosemide (LASIX) 20 MG tablet Take 20 mg by mouth 2 (two) times daily as needed for edema.     gabapentin (NEURONTIN) 600 MG tablet Take 1 tablet (600 mg total) by mouth 3 (three) times daily. 90 tablet 2   glucose blood test strip Test up to 4 times a day 100 each 0   hydrOXYzine (VISTARIL) 50 MG capsule TAKE 1 CAPSULE(50 MG) BY MOUTH AT BEDTIME 30 capsule 0   indapamide (LOZOL) 2.5 MG tablet Take 1 tablet (2.5 mg total) by mouth daily. 30 tablet 11   Lancets (FREESTYLE) lancets Use to test 4 times daily 100 each 0   lisinopril-hydrochlorothiazide (ZESTORETIC) 20-12.5 MG tablet Take 1 tablet by mouth daily. 90 tablet 3   meclizine (ANTIVERT) 25 MG tablet TAKE 1 TABLET(25 MG) BY MOUTH THREE TIMES DAILY AS NEEDED FOR DIZZINESS OR NAUSEA 30 tablet 0   omeprazole (PRILOSEC) 20 MG capsule Take 1 capsule (20 mg total) by mouth daily. 90 capsule 1   ondansetron (ZOFRAN) 4 MG tablet Take 1 tablet (4 mg total) by mouth daily as needed for nausea or vomiting. 30 tablet 1   oxyCODONE (OXY IR/ROXICODONE) 5 MG immediate release tablet Take 1 tablet (5 mg total) by mouth every 4 (four) hours as needed (pain). 30 tablet 0    Potassium Citrate (UROCIT-K 15) 15 MEQ (1620 MG) TBCR Take 1 tablet by mouth in the morning and at bedtime. 60 tablet 11   promethazine (PHENERGAN) 25 MG tablet TAKE 1 TABLET(25 MG) BY MOUTH EVERY 8 HOURS AS NEEDED FOR NAUSEA OR VOMITING 20 tablet 0   simvastatin (ZOCOR) 40 MG tablet Take 1 tablet (40 mg total) by mouth every evening for cholesterol 90 tablet 3   nitrofurantoin, macrocrystal-monohydrate, (MACROBID) 100 MG capsule Take 1 capsule (100 mg total) by mouth 2 (two) times daily. 10 capsule 0   No facility-administered medications prior to visit.    Allergies  Allergen Reactions   Ciprofloxacin Hives and Swelling   Penicillins Anaphylaxis and Rash   Codeine Other (See Comments)    had hallicunations   Morphine Nausea And Vomiting and Other (See Comments)    recieved in ED due to [place] and had multple doses - gave visual hallucinations and vomitting.   Sulfonamide Derivatives Other (See Comments)    REACTION: Unsure - childhood allergy   Vancomycin Itching    ROS Review of Systems  Constitutional:  Negative for chills and fever.  HENT:  Negative for congestion, sinus pressure, sinus pain and sore throat.   Eyes:  Negative for pain and discharge.  Respiratory:  Negative for cough and shortness of breath.   Cardiovascular:  Negative for chest pain and palpitations.  Gastrointestinal:  Negative for abdominal pain, diarrhea, nausea and vomiting.  Endocrine: Negative for polydipsia and polyuria.  Genitourinary:  Negative for dysuria and hematuria.       Perineal itching  Musculoskeletal:  Positive for back pain. Negative for neck pain and neck stiffness.  Skin:  Negative for rash.  Neurological:  Negative for dizziness and weakness.  Psychiatric/Behavioral:  Positive for sleep disturbance. Negative for agitation and behavioral problems.       Objective:  Physical Exam Vitals reviewed.  Constitutional:      General: She is not in acute distress.     Appearance: She is obese. She is not diaphoretic.  HENT:     Head: Normocephalic and atraumatic.     Mouth/Throat:     Mouth: Mucous membranes are moist.     Pharynx: No posterior oropharyngeal erythema.  Eyes:     General: No scleral icterus.    Extraocular Movements: Extraocular movements intact.  Cardiovascular:     Rate and Rhythm: Normal rate and regular rhythm.     Pulses: Normal pulses.     Heart sounds: Normal heart sounds. No murmur heard. Pulmonary:     Breath sounds: Normal breath sounds. No wheezing or rales.  Musculoskeletal:     Cervical back: Neck supple. No tenderness.     Right lower leg: No edema.     Left lower leg: No edema.  Skin:    General: Skin is warm.     Findings: No rash.  Neurological:     General: No focal deficit present.     Mental Status: She is alert and oriented to person, place, and time.  Psychiatric:        Mood and Affect: Mood normal.        Behavior: Behavior normal.     BP 118/64 (BP Location: Right Arm, Patient Position: Sitting, Cuff Size: Normal)   Pulse 80   Resp 18   Ht 4' 9" (1.448 m)   Wt 174 lb 3.2 oz (79 kg)   LMP 07/16/2012   SpO2 96%   BMI 37.70 kg/m  Wt Readings from Last 3 Encounters:  10/08/22 174 lb 3.2 oz (79 kg)  09/25/22 (P) 176 lb 5.9 oz (80 kg)  08/20/22 176 lb 6.4 oz (80 kg)    Lab Results  Component Value Date   TSH 1.110 05/24/2022   Lab Results  Component Value Date   WBC 7.8 07/02/2022   HGB 14.2 07/02/2022   HCT 43.9 07/02/2022   MCV 85.4 07/02/2022   PLT 302 07/02/2022   Lab Results  Component Value Date   NA 137 09/25/2022   K 3.3 (L) 09/25/2022   CO2 28 09/25/2022   GLUCOSE 113 (H) 09/25/2022   BUN 12 09/25/2022   CREATININE 0.72 09/25/2022   BILITOT 0.6 05/24/2022   ALKPHOS 103 05/24/2022   AST 20 05/24/2022   ALT 17 05/24/2022   PROT 6.2 05/24/2022   ALBUMIN 4.0 05/24/2022   CALCIUM 8.6 (L) 09/25/2022   ANIONGAP 8 09/25/2022   EGFR 100 05/24/2022   Lab Results   Component Value Date   CHOL 117 11/30/2021   Lab Results  Component Value Date   HDL 47 11/30/2021   Lab Results  Component Value Date   LDLCALC 53 11/30/2021   Lab Results  Component Value Date   TRIG 84 11/30/2021   Lab Results  Component Value Date   CHOLHDL 4.0 07/20/2013   Lab Results  Component Value Date   HGBA1C 6.2 (H) 09/25/2022      Assessment & Plan:   Problem List Items Addressed This Visit       Cardiovascular and Mediastinum   Essential hypertension, benign    BP Readings from Last 1 Encounters:  10/08/22 118/64  Well-controlled Counseled for compliance with the medications Advised DASH diet and moderate exercise/walking as tolerated        Endocrine   Diabetes mellitus ([place]) - Primary    Lab  Results  Component Value Date   HGBA1C 6.2 (H) 09/25/2022  Well controlled followed by Dr. Dorris Fetch now On Trulicity and Iva Boop Advised to follow diabetic diet On statin and ACEi F/u CMP and lipid panel Diabetic eye exam: Advised to follow up with Ophthalmology for diabetic eye exam        Genitourinary   Recurrent nephrolithiasis    Has had multiple urologic procedures, followed by urology Needs to follow low-salt diet        Other   Mixed hyperlipidemia    On Zocor Check lipid profile      Primary insomnia    Increased dose of Vistaril to 50 mg nightly as needed, but still has persistent concern Has been on Ativan and Xanax in the past Started Quviviq Sleep hygiene discussed      Relevant Medications   Daridorexant HCl (QUVIVIQ) 50 MG TABS   Other Visit Diagnoses     Need for varicella vaccine       Relevant Orders   Zoster Recombinant (Shingrix ) (Completed)   Acute vaginitis     Likely candidal vaginitis as she is on SGLT2i Fluconazole for now   Relevant Medications   fluconazole (DIFLUCAN) 150 MG tablet       Meds ordered this encounter  Medications   Daridorexant HCl (QUVIVIQ) 50 MG TABS    Sig: Take 1 tablet by  mouth at bedtime.    Dispense:  30 tablet    Refill:  3   fluconazole (DIFLUCAN) 150 MG tablet    Sig: Take 1 tablet (150 mg total) by mouth every 3 (three) days.    Dispense:  2 tablet    Refill:  0    Follow-up: Return in about 4 months (around 02/07/2023) for Annual physical.    Lindell Spar, MD

## 2022-10-08 NOTE — Patient Instructions (Addendum)
Please start taking Quviviq as prescribed.  Please maintain simple sleep hygiene. - Maintain dark and non-noisy environment in the bedroom. - Please use the bedroom for sleep and sexual activity only. - Do not use electronic devices in the bedroom. - Please take dinner at least 2 hours before bedtime. - Please avoid caffeinated products in the evening, including coffee, soft drinks. - Please try to maintain the regular sleep-wake cycle - Go to bed and wake up at the same time.  Please continue to take other medications as prescribed.  Please continue to follow low carb diet and perform moderate exercise/walking at least 150 mins/week.

## 2022-10-10 ENCOUNTER — Other Ambulatory Visit: Payer: Self-pay | Admitting: *Deleted

## 2022-10-10 ENCOUNTER — Other Ambulatory Visit (HOSPITAL_COMMUNITY): Payer: Self-pay

## 2022-10-10 ENCOUNTER — Telehealth: Payer: Self-pay | Admitting: Internal Medicine

## 2022-10-10 ENCOUNTER — Other Ambulatory Visit: Payer: Self-pay | Admitting: Internal Medicine

## 2022-10-10 DIAGNOSIS — F5101 Primary insomnia: Secondary | ICD-10-CM

## 2022-10-10 MED ORDER — QUVIVIQ 50 MG PO TABS: 1.0000 | ORAL_TABLET | Freq: Every day | ORAL | 3 refills | Status: DC

## 2022-10-10 MED ORDER — GABAPENTIN 600 MG PO TABS
600.0000 mg | ORAL_TABLET | Freq: Three times a day (TID) | ORAL | 2 refills | Status: DC
  Filled 2022-10-10: qty 90, 30d supply, fill #0
  Filled 2022-11-09: qty 90, 30d supply, fill #1
  Filled 2022-12-13: qty 90, 30d supply, fill #2

## 2022-10-10 NOTE — Assessment & Plan Note (Signed)
Lab Results  Component Value Date   HGBA1C 6.2 (H) 09/25/2022   Well controlled followed by Dr. Dorris Fetch now On Trulicity and Iva Boop Advised to follow diabetic diet On statin and ACEi F/u CMP and lipid panel Diabetic eye exam: Advised to follow up with Ophthalmology for diabetic eye exam

## 2022-10-10 NOTE — Assessment & Plan Note (Signed)
Has had multiple urologic procedures, followed by urology Needs to follow low-salt diet

## 2022-10-10 NOTE — Telephone Encounter (Signed)
Prescription printed but will need patient to provide fax number before we can send

## 2022-10-10 NOTE — Assessment & Plan Note (Signed)
On Zocor Check lipid profile 

## 2022-10-10 NOTE — Assessment & Plan Note (Signed)
BP Readings from Last 1 Encounters:  10/08/22 118/64   Well-controlled Counseled for compliance with the medications Advised DASH diet and moderate exercise/walking as tolerated

## 2022-10-10 NOTE — Telephone Encounter (Signed)
Please let her know I Cannot send until she verifies a contact for the pharmacy

## 2022-10-10 NOTE — Telephone Encounter (Signed)
Patient does not have any numbers

## 2022-10-10 NOTE — Telephone Encounter (Signed)
Patient called in regard to   Daridorexant HCl (QUVIVIQ) 50 MG TABS  Patient is needing medication sent in to [place].   Pt does not have any fax or phone number

## 2022-10-10 NOTE — Assessment & Plan Note (Addendum)
Increased dose of Vistaril to 50 mg nightly as needed, but still has persistent concern Has been on Ativan and Xanax in the past Started Quviviq Sleep hygiene discussed

## 2022-10-16 MED ORDER — QUVIVIQ 50 MG PO TABS: 1.0000 | ORAL_TABLET | Freq: Every day | ORAL | 3 refills | Status: DC

## 2022-10-16 NOTE — Addendum Note (Signed)
Addended byIhor Dow on: 10/16/2022 03:03 PM   Modules accepted: Orders

## 2022-10-16 NOTE — Telephone Encounter (Signed)
Patient called back and gave information pharmacy   [facility] Phone # [phone]  Patient does not have no address to the pharmacy   Daridorexant HCl (QUVIVIQ) 50 MG TABS    Prescription Request  10/16/2022  Is this a "Controlled Substance" medicine? No  LOV: 10/10/2022   What is the name of the medication or equipment? Daridorexant HCl (QUVIVIQ) 50 MG TABS   Have you contacted your pharmacy to request a refill? No   Which pharmacy would you like this sent to?   [facility] # [phone]    Patient notified that their request is being sent to the clinical staff for review and that they should receive a response within 2 business days.   Please advise at [phone] (mobile)

## 2022-10-18 ENCOUNTER — Other Ambulatory Visit: Payer: Self-pay | Admitting: "Endocrinology

## 2022-10-18 ENCOUNTER — Other Ambulatory Visit (HOSPITAL_COMMUNITY): Payer: Self-pay

## 2022-10-19 ENCOUNTER — Other Ambulatory Visit (HOSPITAL_COMMUNITY): Payer: Self-pay

## 2022-10-19 ENCOUNTER — Ambulatory Visit: Payer: No Typology Code available for payment source | Admitting: Urology

## 2022-10-19 MED ORDER — SYNJARDY 12.5-500 MG PO TABS
1.0000 | ORAL_TABLET | Freq: Every day | ORAL | 0 refills | Status: DC
  Filled 2022-10-19: qty 30, 30d supply, fill #0

## 2022-10-30 ENCOUNTER — Other Ambulatory Visit: Payer: Self-pay | Admitting: Internal Medicine

## 2022-10-30 DIAGNOSIS — F5101 Primary insomnia: Secondary | ICD-10-CM

## 2022-11-06 ENCOUNTER — Ambulatory Visit: Payer: No Typology Code available for payment source

## 2022-11-06 LAB — HM DIABETES EYE EXAM

## 2022-11-09 ENCOUNTER — Other Ambulatory Visit: Payer: Self-pay | Admitting: "Endocrinology

## 2022-11-09 ENCOUNTER — Other Ambulatory Visit (HOSPITAL_COMMUNITY): Payer: Self-pay

## 2022-11-10 ENCOUNTER — Other Ambulatory Visit (HOSPITAL_COMMUNITY): Payer: Self-pay

## 2022-11-12 ENCOUNTER — Other Ambulatory Visit (HOSPITAL_COMMUNITY): Payer: Self-pay

## 2022-11-13 ENCOUNTER — Other Ambulatory Visit (HOSPITAL_COMMUNITY): Payer: Self-pay

## 2022-11-13 MED ORDER — SYNJARDY 12.5-500 MG PO TABS
1.0000 | ORAL_TABLET | Freq: Every day | ORAL | 1 refills | Status: DC
  Filled 2022-11-13: qty 30, 30d supply, fill #0

## 2022-11-16 ENCOUNTER — Ambulatory Visit: Payer: No Typology Code available for payment source | Admitting: Urology

## 2022-11-22 LAB — COMPREHENSIVE METABOLIC PANEL
ALT: 15 IU/L (ref 0–32)
AST: 20 IU/L (ref 0–40)
Albumin/Globulin Ratio: 1.5 (ref 1.2–2.2)
Albumin: 4.2 g/dL (ref 3.8–4.9)
Alkaline Phosphatase: 104 IU/L (ref 44–121)
BUN/Creatinine Ratio: 16 (ref 9–23)
BUN: 13 mg/dL (ref 6–24)
Bilirubin Total: 0.6 mg/dL (ref 0.0–1.2)
CO2: 26 mmol/L (ref 20–29)
Calcium: 8.9 mg/dL (ref 8.7–10.2)
Chloride: 97 mmol/L (ref 96–106)
Creatinine, Ser: 0.79 mg/dL (ref 0.57–1.00)
Globulin, Total: 2.8 g/dL (ref 1.5–4.5)
Glucose: 107 mg/dL — ABNORMAL HIGH (ref 70–99)
Potassium: 3.9 mmol/L (ref 3.5–5.2)
Sodium: 137 mmol/L (ref 134–144)
Total Protein: 7 g/dL (ref 6.0–8.5)
eGFR: 88 mL/min/{1.73_m2} (ref >59)

## 2022-11-22 LAB — GLUTAMIC ACID DECARBOXYLASE AUTO ABS: Glutamic Acid Decarb Ab: <5 U/mL (ref 0.0–5.0)

## 2022-11-22 LAB — LIPID PANEL
Chol/HDL Ratio: 2.9 ratio (ref 0.0–4.4)
Cholesterol, Total: 156 mg/dL (ref 100–199)
HDL: 53 mg/dL (ref >39)
LDL Chol Calc (NIH): 72 mg/dL (ref 0–99)
Triglycerides: 185 mg/dL — ABNORMAL HIGH (ref 0–149)
VLDL Cholesterol Cal: 31 mg/dL (ref 5–40)

## 2022-11-22 LAB — ANTI-ISLET CELL ANTIBODY: Islet Cell Ab: NEGATIVE

## 2022-11-27 ENCOUNTER — Other Ambulatory Visit: Payer: Self-pay | Admitting: Internal Medicine

## 2022-11-27 ENCOUNTER — Ambulatory Visit (INDEPENDENT_AMBULATORY_CARE_PROVIDER_SITE_OTHER): Payer: No Typology Code available for payment source | Admitting: "Endocrinology

## 2022-11-27 ENCOUNTER — Encounter: Payer: Self-pay | Admitting: "Endocrinology

## 2022-11-27 VITALS — BP 94/66 | HR 72 | Ht <= 58 in | Wt 176.4 lb

## 2022-11-27 DIAGNOSIS — Z794 Long term (current) use of insulin: Secondary | ICD-10-CM | POA: Diagnosis not present

## 2022-11-27 DIAGNOSIS — F5101 Primary insomnia: Secondary | ICD-10-CM

## 2022-11-27 DIAGNOSIS — Z6838 Body mass index (BMI) 38.0-38.9, adult: Secondary | ICD-10-CM

## 2022-11-27 DIAGNOSIS — I10 Essential (primary) hypertension: Secondary | ICD-10-CM | POA: Diagnosis not present

## 2022-11-27 DIAGNOSIS — E1169 Type 2 diabetes mellitus with other specified complication: Secondary | ICD-10-CM

## 2022-11-27 DIAGNOSIS — E782 Mixed hyperlipidemia: Secondary | ICD-10-CM

## 2022-11-27 LAB — POCT UA - MICROALBUMIN
Albumin/Creatinine Ratio, Urine, POC: <30
Creatinine, POC: 300 mg/dL
Microalbumin Ur, POC: 10 mg/L

## 2022-11-27 MED ORDER — LISINOPRIL-HYDROCHLOROTHIAZIDE 10-12.5 MG PO TABS: 1.0000 | ORAL_TABLET | Freq: Every day | ORAL | 1 refills | Status: DC

## 2022-11-27 NOTE — Progress Notes (Signed)
11/27/2022, 8:09 PM   Endocrinology follow-up note  Subjective:    Patient ID: [name], female    DOB: 11-04-1966.  [name] is being seen in follow up after she was seen in consultation for management of currently uncontrolled symptomatic diabetes requested by  Lindell Spar, MD.   Past Medical History:  Diagnosis Date   Diabetes mellitus    insulin pump 2013   Dysphagia 03/09/2020   Epidural hematoma ([place]) 12/14/2021   GERD (gastroesophageal reflux disease)    History of kidney stones    Hypercholesteremia    Hypertension    Incarcerated umbilical hernia 54/27/0623   Kidney stone    Kidney stones 08/31/2008   Qualifier: Diagnosis of  By: Jonna Munro MD, Cornelius     Neuropathy    Obesity    Palpitations    Shortness of breath    Vertigo     Past Surgical History:  Procedure Laterality Date   BIOPSY  06/23/2020   Procedure: BIOPSY;  Surgeon: Daneil Dolin, MD;  [facility];  Service: Endoscopy;;   CARDIAC CATHETERIZATION  12/11/2012   normal coronary arteries   CESAREAN SECTION  1994;2000   [place]   COLONOSCOPY WITH PROPOFOL N/A 06/23/2020   large amount of stool in entire colon, prep inadequate.    CYSTOSCOPY W/ URETERAL STENT PLACEMENT  05/08/2012   Procedure: CYSTOSCOPY WITH RETROGRADE PYELOGRAM/URETERAL STENT PLACEMENT;  Surgeon: Marissa Nestle, MD;  Location: [facility];  Service: Urology;  Laterality: Right;   CYSTOSCOPY WITH RETROGRADE PYELOGRAM, URETEROSCOPY AND STENT PLACEMENT Right 05/04/2020   Procedure: CYSTOSCOPY WITH RIGHT RETROGRADE PYELOGRAM, RIGHT URETEROSCOPY AND RIGHT URETERAL STENT EXCHANGE;  Surgeon: Cleon Gustin, MD;  Location: [facility];  Service: Urology;  Laterality: Right;   CYSTOSCOPY WITH RETROGRADE PYELOGRAM, URETEROSCOPY AND STENT PLACEMENT Left 08/08/2020   Procedure: CYSTOSCOPY WITH RETROGRADE PYELOGRAM, URETEROSCOPY WITH LASER  AND STENT PLACEMENT;  Surgeon:  Cleon Gustin, MD;  Location: [facility];  Service: Urology;  Laterality: Left;   CYSTOSCOPY WITH RETROGRADE PYELOGRAM, URETEROSCOPY AND STENT PLACEMENT Right 11/08/2020   Procedure: CYSTOSCOPY WITH RIGHT RETROGRADE PYELOGRAM, RIGHT URETEROSCOPY AND STENT PLACEMENT;  Surgeon: Cleon Gustin, MD;  Location: [facility];  Service: Urology;  Laterality: Right;   CYSTOSCOPY WITH RETROGRADE PYELOGRAM, URETEROSCOPY AND STENT PLACEMENT Left 12/27/2020   Procedure: CYSTOSCOPY WITH LEFT RETROGRADE PYELOGRAM, LEFT URETEROSCOPY AND LEFT URETERAL STENT PLACEMENT;  Surgeon: Cleon Gustin, MD;  [facility];  Service: Urology;  Laterality: Left;   CYSTOSCOPY WITH RETROGRADE PYELOGRAM, URETEROSCOPY AND STENT PLACEMENT Bilateral 03/16/2021   Procedure: CYSTOSCOPY WITH BILATERAL  RETROGRADE PYELOGRAM, BILATERAL URETEROSCOPY AND STENT PLACEMENT ON THE LEFT;  Surgeon: Cleon Gustin, MD;  [facility]: [facility];  Service: Urology;  Laterality: Bilateral;   CYSTOSCOPY WITH RETROGRADE PYELOGRAM, URETEROSCOPY AND STENT PLACEMENT Left 06/28/2022   Procedure: CYSTOSCOPY WITH RETROGRADE PYELOGRAM, URETEROSCOPY AND STENT PLACEMENT;  Surgeon: Cleon Gustin, MD;  [facility];  Service: Urology;  Laterality: Left;   CYSTOSCOPY WITH RETROGRADE PYELOGRAM, URETEROSCOPY AND STENT PLACEMENT Bilateral 09/27/2022   Procedure: CYSTOSCOPY WITH BILATERAL RETROGRADE PYELOGRAM, URETEROSCOPY AND STENT PLACEMENT;  Surgeon: Cleon Gustin, MD;  Location: [facility];  Service: Urology;  Laterality: Bilateral;  pt knows to arrive at 6:00   [place] Right 02/25/2020   Procedure: CYSTOSCOPY WITH RIGHT RETROGRADE RIGHT STENT PLACEMENT;  Surgeon: Festus Aloe, MD;  Location: [facility];  Service: Urology;  Laterality: Right;   ESOPHAGOGASTRODUODENOSCOPY (EGD) WITH PROPOFOL N/A 06/23/2020   Normal esophagus, multiple localized erosions were found in gastric antrum s/p biopsy, normal duodenum. Empiric  dilation.Reactive gastropathy with erosions, negative H.pylori, no dysplasia.     EXTRACORPOREAL SHOCK WAVE LITHOTRIPSY Right 10/11/2020   Procedure: EXTRACORPOREAL SHOCK WAVE LITHOTRIPSY (ESWL);  Surgeon: Cleon Gustin, MD;  Location: [facility];  Service: Urology;  Laterality: Right;   FOOT SURGERY Right March, 2013   [facility]-removal of bone spur   HOLMIUM LASER APPLICATION  12/11/5850   Procedure: HOLMIUM LASER LITHOTRIPSY RIGHT URETERAL CALCULUS;  Surgeon: Cleon Gustin, MD;  Location: [facility];  Service: Urology;;   HYSTEROSCOPY WITH THERMACHOICE  04/25/2012   Procedure: HYSTEROSCOPY WITH THERMACHOICE;  Surgeon: Florian Buff, MD;  Location: [facility];  Service: Gynecology;  Laterality: N/A;  total therapy time= 11 minutes 42 seconds; 34 ml D5w  in and 34 ml D5w out; temp =87 degrees F   LEFT HEART CATHETERIZATION WITH CORONARY ANGIOGRAM N/A 12/11/2012   Procedure: LEFT HEART CATHETERIZATION WITH CORONARY ANGIOGRAM;  Surgeon: Lorretta Harp, MD;  Location: [facility] CATH LAB;  Service: Cardiovascular;  Laterality: N/A;   LUMBAR WOUND DEBRIDEMENT N/A 12/14/2021   Procedure: LUMBAR HEMATOMA EVACUATION;  Surgeon: Judith Part, MD;  Location: [place];  Service: Neurosurgery;  Laterality: N/A;   MALONEY DILATION N/A 06/23/2020   Procedure: Venia Minks DILATION;  Surgeon: Daneil Dolin, MD;  [facility];  Service: Endoscopy;  Laterality: N/A;   Sinus sergery  1988   [place]   STONE EXTRACTION WITH BASKET Right 11/08/2020   Procedure: STONE EXTRACTION WITH BASKET;  Surgeon: Cleon Gustin, MD;  Location: [facility];  Service: Urology;  Laterality: Right;   TRANSFORAMINAL LUMBAR INTERBODY FUSION (TLIF) WITH PEDICLE SCREW FIXATION 1 LEVEL N/A 12/05/2021   Procedure: Open Lumbar five-Sacral one Laminectomy/Transforaminal Lumbar Interbody Fusion/Posterolateral instrumented fusion;  Surgeon: Judith Part, MD;  Location: [place];  Service: Neurosurgery;  Laterality: N/A;  3C    UMBILICAL HERNIA REPAIR N/A 03/25/2014   Procedure: UMBILICAL HERNIA REPAIR;  Surgeon: Scherry Ran, MD;  Location: [facility];  Service: General;  Laterality: N/A;   uterine ablation      Social History   Socioeconomic History   Marital status: Married    Spouse name: Not on file   Number of children: 2   Years of education: college   Highest education level: Not on file  Occupational History   Occupation: CNA    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: [employer]   Occupation: Quarry manager- at [employer]  Tobacco Use   Smoking status: Never   Smokeless tobacco: Never  Vaping Use   Vaping Use: Never used  Substance and Sexual Activity   Alcohol use: No   Drug use: No   Sexual activity: Not Currently    Partners: Male    Birth control/protection: Post-menopausal    Comment: ablation  Other Topics Concern   Not on file  Social History Narrative   Regular exercise: walks Caffeine use:    Social Determinants of Health   Financial Resource Strain: Low Risk  (01/11/2022)   Overall Financial Resource Strain (CARDIA)    Difficulty of Paying Living Expenses: Not hard at all  Food Insecurity: Food Insecurity Present (01/11/2022)   Hunger Vital Sign    Worried About Running Out of Food in the Last Year: Sometimes true    Ran Out of Food in  the Last Year: Never true  Transportation Needs: No Transportation Needs (01/11/2022)   PRAPARE - Hydrologist (Medical): No    Lack of Transportation (Non-Medical): No  Physical Activity: Insufficiently Active (01/11/2022)   Exercise Vital Sign    Days of Exercise per Week: 2 days    Minutes of Exercise per Session: 20 min  Stress: Stress Concern Present (01/11/2022)   [place]    Feeling of Stress : To some extent  Social Connections: Moderately Integrated (01/11/2022)   Social Connection and Isolation Panel [NHANES]    Frequency of Communication  with Friends and Family: Three times a week    Frequency of Social Gatherings with Friends and Family: Twice a week    Attends Religious Services: 1 to 4 times per year    Active Member of [employer] or Organizations: No    Attends Music therapist: Never    Marital Status: Married    Family History  Problem Relation Age of Onset   Hypertension Mother    Cancer Mother    Diabetes Mother    Stroke Mother    Diabetes Father    Depression Paternal Grandmother    Depression Paternal Grandfather    Cancer Paternal Uncle    Heart disease Other    Cancer Other    Diabetes Other    Achalasia Other    Anesthesia problems Neg Hx    Hypotension Neg Hx    Malignant hyperthermia Neg Hx    Pseudochol deficiency Neg Hx    Colon cancer Neg Hx    Colon polyps Neg Hx     Outpatient Encounter Medications as of 11/27/2022  Medication Sig   lisinopril-hydrochlorothiazide (ZESTORETIC) 10-12.5 MG tablet Take 1 tablet by mouth daily.   albuterol (VENTOLIN HFA) 108 (90 Base) MCG/ACT inhaler Inhale 2 puffs into the lungs every 6 (six) hours as needed for wheezing or shortness of breath.    blood glucose meter kit and supplies Use up to four times daily as directed.   cyclobenzaprine (FLEXERIL) 10 MG tablet Take 1 tablet (10 mg total) by mouth 3 (three) times daily as needed for muscle spasms.   Daridorexant HCl (QUVIVIQ) 50 MG TABS Take 1 tablet by mouth at bedtime.   Dulaglutide (TRULICITY) 3 JJ/9.4RD SOPN Inject 3 mg as directed once a week.   Empagliflozin-metFORMIN HCl (SYNJARDY) 12.5-500 MG TABS Take 1 tablet by mouth daily.   fluconazole (DIFLUCAN) 150 MG tablet Take 1 tablet (150 mg total) by mouth every 3 (three) days.   furosemide (LASIX) 20 MG tablet Take 20 mg by mouth 2 (two) times daily as needed for edema.   gabapentin (NEURONTIN) 600 MG tablet Take 1 tablet (600 mg total) by mouth 3 (three) times daily.   glucose blood test strip Test up to 4 times a day   hydrOXYzine  (VISTARIL) 50 MG capsule TAKE 1 CAPSULE(50 MG) BY MOUTH AT BEDTIME   indapamide (LOZOL) 2.5 MG tablet Take 1 tablet (2.5 mg total) by mouth daily.   Lancets (FREESTYLE) lancets Use to test 4 times daily   meclizine (ANTIVERT) 25 MG tablet TAKE 1 TABLET(25 MG) BY MOUTH THREE TIMES DAILY AS NEEDED FOR DIZZINESS OR NAUSEA   omeprazole (PRILOSEC) 20 MG capsule Take 1 capsule (20 mg total) by mouth daily.   ondansetron (ZOFRAN) 4 MG tablet Take 1 tablet (4 mg total) by mouth daily as needed for nausea or vomiting.  oxyCODONE (OXY IR/ROXICODONE) 5 MG immediate release tablet Take 1 tablet (5 mg total) by mouth every 4 (four) hours as needed (pain). (Patient not taking: Reported on 11/27/2022)   Potassium Citrate (UROCIT-K 15) 15 MEQ (1620 MG) TBCR Take 1 tablet by mouth in the morning and at bedtime.   promethazine (PHENERGAN) 25 MG tablet TAKE 1 TABLET(25 MG) BY MOUTH EVERY 8 HOURS AS NEEDED FOR NAUSEA OR VOMITING   simvastatin (ZOCOR) 40 MG tablet Take 1 tablet (40 mg total) by mouth every evening for cholesterol   [DISCONTINUED] lisinopril-hydrochlorothiazide (ZESTORETIC) 20-12.5 MG tablet Take 1 tablet by mouth daily.   No facility-administered encounter medications on file as of 11/27/2022.    ALLERGIES: Allergies  Allergen Reactions   Ciprofloxacin Hives and Swelling   Penicillins Anaphylaxis and Rash   Codeine Other (See Comments)    had hallicunations   Morphine Nausea And Vomiting and Other (See Comments)    recieved in ED due to [facility] and had multple doses - gave visual hallucinations and vomitting.   Sulfonamide Derivatives Other (See Comments)    REACTION: Unsure - childhood allergy   Vancomycin Itching    VACCINATION STATUS: Immunization History  Administered Date(s) Administered   H1N1 09/29/2008   Influenza Split 08/25/2012   Influenza Whole 08/31/2008   Influenza,inj,Quad PF,6+ Mos 10/19/2013, 08/30/2021, 09/13/2022   Moderna Covid-19 Vaccine Bivalent Booster  27yr & up 09/09/2021   Moderna Sars-Covid-2 Vaccination 01/05/2020, 02/02/2020   Pneumococcal Polysaccharide-23 09/14/2008, 02/21/2014   Tdap 06/28/2020   Zoster Recombinat (Shingrix) 04/04/2022, 10/08/2022    Diabetes She presents for her follow-up diabetic visit. She has type 2 diabetes mellitus. Her disease course has been improving. Pertinent negatives for hypoglycemia include no confusion, headaches, pallor or seizures. Pertinent negatives for diabetes include no chest pain, no polydipsia, no polyphagia and no polyuria. Symptoms are improving. There are no diabetic complications. Risk factors for coronary artery disease include dyslipidemia, diabetes mellitus, obesity, sedentary lifestyle and hypertension. Her weight is decreasing steadily. She is following a generally unhealthy diet. When asked about meal planning, she reported none. Her home blood glucose trend is decreasing steadily. (She presents with controlled glycemic profile.  Her point-of-care A1c is 6.2%, overall improving from 10.2%.  She is not on insulin anymore.  She is tolerating her Trulicity, and Synjardy.  She presents with 21 pounds weight loss.   ) An ACE inhibitor/angiotensin II receptor blocker is being taken.  Hyperlipidemia This is a chronic problem. Exacerbating diseases include diabetes. Pertinent negatives include no chest pain, myalgias or shortness of breath. Current antihyperlipidemic treatment includes statins. Risk factors for coronary artery disease include diabetes mellitus, dyslipidemia, hypertension, obesity, family history and a sedentary lifestyle.  Hypertension This is a chronic problem. The current episode started more than 1 year ago. The problem is controlled. Pertinent negatives include no chest pain, headaches, palpitations or shortness of breath. Risk factors for coronary artery disease include dyslipidemia, diabetes mellitus, post-menopausal state, sedentary lifestyle, family history and obesity. Past  treatments include ACE inhibitors and diuretics.    Review of Systems  Constitutional:  Negative for chills, fever and unexpected weight change.  HENT:  Negative for trouble swallowing and voice change.   Eyes:  Negative for visual disturbance.  Respiratory:  Negative for cough, shortness of breath and wheezing.   Cardiovascular:  Negative for chest pain, palpitations and leg swelling.  Gastrointestinal:  Negative for diarrhea, nausea and vomiting.  Endocrine: Negative for cold intolerance, heat intolerance, polydipsia, polyphagia and polyuria.  Musculoskeletal:  Negative for arthralgias and myalgias.  Skin:  Negative for color change, pallor, rash and wound.  Neurological:  Negative for seizures and headaches.  Psychiatric/Behavioral:  Negative for confusion and suicidal ideas.     Objective:       11/27/2022    3:12 PM 10/08/2022    3:20 PM 09/27/2022   11:14 AM  Vitals with BMI  Height _0  _1    Weight 176 lbs 6 oz 174 lbs 3 oz   BMI 83.15 17.61   Systolic 94 607 371  Diastolic 66 64 71  Pulse 72 80 64    BP 94/66   Pulse 72   Ht _2  (1.448 m)   Wt 176 lb 6.4 oz (80 kg)   LMP 07/16/2012   BMI 38.17 kg/m   Wt Readings from Last 3 Encounters:  11/27/22 176 lb 6.4 oz (80 kg)  10/08/22 174 lb 3.2 oz (79 kg)  09/25/22 (P) 176 lb 5.9 oz (80 kg)      CMP ( most recent) CMP     Component Value Date/Time   NA 137 11/21/2022 1512   K 3.9 11/21/2022 1512   CL 97 11/21/2022 1512   CO2 26 11/21/2022 1512   GLUCOSE 107 (H) 11/21/2022 1512   GLUCOSE 113 (H) 09/25/2022 1449   BUN 13 11/21/2022 1512   CREATININE 0.79 11/21/2022 1512   CREATININE 0.89 05/25/2020 1626   CALCIUM 8.9 11/21/2022 1512   PROT 7.0 11/21/2022 1512   ALBUMIN 4.2 11/21/2022 1512   AST 20 11/21/2022 1512   ALT 15 11/21/2022 1512   ALKPHOS 104 11/21/2022 1512   BILITOT 0.6 11/21/2022 1512   GFRNONAA >60 09/25/2022 1449   GFRAA >60 08/05/2020 1248    Diabetic Labs (most recent): Lab  Results  Component Value Date   HGBA1C 6.2 (H) 09/25/2022   HGBA1C 6.5 06/07/2022   HGBA1C 7.7 (A) 03/05/2022   MICROALBUR 10 11/27/2022   MICROALBUR 1.10 11/14/2012     Lipid Panel ( most recent) Lipid Panel     Component Value Date/Time   CHOL 156 11/21/2022 1512   TRIG 185 (H) 11/21/2022 1512   HDL 53 11/21/2022 1512   CHOLHDL 2.9 11/21/2022 1512   CHOLHDL 4.0 07/20/2013 1100   VLDL 48 (H) 07/20/2013 1100   LDLCALC 72 11/21/2022 1512   LABVLDL 31 11/21/2022 1512      Lab Results  Component Value Date   TSH 1.110 05/24/2022   TSH 1.438 11/18/2012   TSH 4.326 09/01/2008   FREET4 0.87 05/24/2022     Assessment & Plan:   Type 2 diabetes, unspecified complication using insulin pump  - ALAYZIA PAVLOCK has currently uncontrolled symptomatic type 2 DM since  56 years of age,.  Recent labs reviewed. She presents with controlled glycemic profile.  Her previsit  A1c is is 6.2%, overall improving from 10.2%.  She is not on insulin anymore.  She is tolerating her Trulicity and Synjardy.  She maintains 20 lbs of weigh loss.     She has not achieved significant excess after she was taken off of insulin. - I had a long discussion with her about the progressive nature of diabetes and the pathology behind its complications.  She does not report gross complications from her diabetes, however she remains at a high risk for more acute and chronic complications which include CAD, CVA, CKD, retinopathy, and neuropathy. These are all discussed in detail with her.  - I discussed all available options  of managing her diabetes including de-escalation of medications. I have counseled her on diet  and weight management  by adopting a Whole Food , Plant Predominant  ( WFPP) nutrition as recommended by [employer] of Lifestyle Medicine. Patient is encouraged to switch to  unprocessed or minimally processed  complex starch, adequate protein intake (mainly plant source), minimal liquid fat (  mainly vegetable oils), plenty of fruits, and vegetables. -  she is advised to stick to a routine mealtimes to eat 3 complete meals a day and snack only when necessary ( to snack only to correct hypoglycemia BG <70 day time or <100 at night).   - she acknowledges that there is a room for improvement in her food and drink choices. - Suggestion is made for her to avoid simple carbohydrates  from her diet including Cakes, Sweet Desserts, Ice Cream, Soda (diet and regular), Sweet Tea, Candies, Chips, Cookies, Store Bought Juices, Alcohol , Artificial Sweeteners,  Coffee Creamer, and "Sugar-free" Products, Lemonade. This will help patient to have more stable blood glucose profile and potentially avoid unintended weight gain.  The following Lifestyle Medicine recommendations according to [place]  [facility]) were discussed and and offered to patient and she  agrees to start the journey:  A. Whole Foods, Plant-Based Nutrition comprising of fruits and vegetables, plant-based proteins, whole-grain carbohydrates was discussed in detail with the patient.   A list for source of those nutrients were also provided to the patient.  Patient will use only water or unsweetened tea for hydration. B.  The need to stay away from risky substances including alcohol, smoking; obtaining 7 to 9 hours of restorative sleep, at least 150 minutes of moderate intensity exercise weekly, the importance of healthy social connections,  and stress management techniques were discussed. C.  A full color page of  Calorie density of various food groups per pound showing examples of each food groups was provided to the patient.  - she will be scheduled with Jearld Fenton, RDN, CDE for individualized diabetes education.  She can avoid insulin . She is advised to continue  Trulicity 3 mg subcutaneously weekly.  She is advised to continue Synjardy 12.5/500 mg once a day at breakfast.  - she is encouraged to call  clinic for blood glucose levels less than 70 or above 150 mg per DL at fasting.     - Specific targets for  A1c;  LDL, HDL,  and Triglycerides were discussed with the patient.  2) Blood Pressure /Hypertension:    Blood pressure is tightly controlled to target   she is advised to take half tablet of her current lisinopril/HCTZ 20-12 0.5 mg p.o. daily with breakfast .  3) Lipids/Hyperlipidemia:   Review of her recent lipid panel showed  controlled  LDL at 72 .  This is an improvement from overall LDL of 113.  she  is advised to continue simvastatin 40 mg p.o. daily at bedtime.    Side effects and precautions discussed with her.  4)  Weight/Diet:  Body mass index is 38.17 kg/m.  -She has lost 21 pounds so far.  Clearly complicating her diabetes care.   she is  a candidate for weight loss. I discussed with her the fact that loss of 5 - 10% of her  current body weight will have the most impact on her diabetes management.  The above detailed  AC[facility] recommendations for nutrition, exercise, sleep, social life, avoidance of risky substances, the need for restorative  sleep   information will also detailed on discharge instructions.  5) Chronic Care/Health Maintenance:  -she  is on ACEI/ARB and Statin medications and  is encouraged to initiate and continue to follow up with Ophthalmology, Dentist,  Podiatrist at least yearly or according to recommendations, and advised to   stay away from smoking. I have recommended yearly flu vaccine and pneumonia vaccine at least every 5 years; moderate intensity exercise for up to 150 minutes weekly; and  sleep for 7- 9 hours a day.  - she is  advised to maintain close follow up with Lindell Spar, MD for primary care needs, as well as her other providers for optimal and coordinated care.   I spent 28 minutes in the care of the patient today including review of labs from [facility], Lipids, Thyroid Function, Hematology (current and previous including abstractions from other  facilities); face-to-face time discussing  her blood glucose readings/logs, discussing hypoglycemia and hyperglycemia episodes and symptoms, medications doses, her options of short and long term treatment based on the latest standards of care / guidelines;  discussion about incorporating lifestyle medicine;  and documenting the encounter. Risk reduction counseling performed per USPSTF guidelines to reduce obesity and cardiovascular risk factors.     Please refer to Patient Instructions for Blood Glucose Monitoring and Insulin/Medications Dosing Guide"  in media tab for additional information. Please  also refer to " Patient Self Inventory" in the Media  tab for reviewed elements of pertinent patient history.  Evon Slack participated in the discussions, expressed understanding, and voiced agreement with the above plans.  All questions were answered to her satisfaction. she is encouraged to contact clinic should she have any questions or concerns prior to her return visit.     Follow up plan: - Return for Fasting Labs  in AM B4 8, A1c -[place].  Glade Lloyd, MD [facility] [address] [place], [place] [address] Phone: [phone]  Fax: [phone]    11/27/2022, 8:09 PM  This note was partially dictated with voice recognition software. Similar sounding words can be transcribed inadequately or may not  be corrected upon review.

## 2022-11-27 NOTE — Patient Instructions (Signed)

## 2022-12-13 ENCOUNTER — Other Ambulatory Visit (HOSPITAL_COMMUNITY): Payer: Self-pay

## 2022-12-13 ENCOUNTER — Other Ambulatory Visit: Payer: Self-pay | Admitting: "Endocrinology

## 2022-12-13 ENCOUNTER — Other Ambulatory Visit: Payer: Self-pay | Admitting: Urology

## 2022-12-14 ENCOUNTER — Other Ambulatory Visit (HOSPITAL_COMMUNITY): Payer: Self-pay

## 2022-12-14 MED ORDER — SYNJARDY 12.5-500 MG PO TABS
1.0000 | ORAL_TABLET | Freq: Every day | ORAL | 0 refills | Status: DC
  Filled 2022-12-14: qty 90, 90d supply, fill #0

## 2022-12-17 ENCOUNTER — Other Ambulatory Visit (HOSPITAL_COMMUNITY): Payer: Self-pay

## 2022-12-18 ENCOUNTER — Ambulatory Visit (HOSPITAL_COMMUNITY): Payer: 59

## 2022-12-21 ENCOUNTER — Ambulatory Visit (HOSPITAL_COMMUNITY)
Admission: RE | Admit: 2022-12-21 | Discharge: 2022-12-21 | Disposition: A | Discharge status: Home / Self Care | Payer: 59 | Source: Ambulatory Visit | Attending: Urology | Admitting: Urology

## 2022-12-21 DIAGNOSIS — N200 Calculus of kidney: Secondary | ICD-10-CM | POA: Insufficient documentation

## 2022-12-21 DIAGNOSIS — Z87442 Personal history of urinary calculi: Secondary | ICD-10-CM | POA: Diagnosis not present

## 2022-12-25 ENCOUNTER — Ambulatory Visit (INDEPENDENT_AMBULATORY_CARE_PROVIDER_SITE_OTHER): Payer: 59 | Admitting: Urology

## 2022-12-25 VITALS — BP 125/80 | HR 71 | Ht 59.0 in | Wt 176.0 lb

## 2022-12-25 DIAGNOSIS — R109 Unspecified abdominal pain: Secondary | ICD-10-CM | POA: Diagnosis not present

## 2022-12-25 DIAGNOSIS — N200 Calculus of kidney: Secondary | ICD-10-CM

## 2022-12-25 MED ORDER — CYCLOBENZAPRINE HCL 10 MG PO TABS: 10.0000 mg | ORAL_TABLET | Freq: Three times a day (TID) | ORAL | 0 refills | Status: DC | PRN

## 2022-12-25 NOTE — Progress Notes (Signed)
12/25/2022 4:05 PM   [name] 1966/11/10 [id number]  Referring provider: Lindell Spar, MD [address] [place],  [place] [address]  Left flank pain   HPI: [name] is a 57yo here for followup for nephrolithiasis and her for evaluation of left flank pain. For the past 2 weeks she has had intermittent sharp, nonraditing left flank pain that is worse with activity and better with rest. Pain is worse with standing from a sitting position. NO stone events since last visit. Renal US 1/12 shows no calculi and no hydronephrosis.    PMH: Past Medical History:  Diagnosis Date   Diabetes mellitus    insulin pump 2013   Dysphagia 03/09/2020   Epidural hematoma (HCC) 12/14/2021   GERD (gastroesophageal reflux disease)    History of kidney stones    Hypercholesteremia    Hypertension    Incarcerated umbilical hernia [id number]   Kidney stone    Kidney stones 08/31/2008   Qualifier: Diagnosis of  By: Jonna Munro MD, Cornelius     Neuropathy    Obesity    Palpitations    Shortness of breath    Vertigo     Surgical History: Past Surgical History:  Procedure Laterality Date   BIOPSY  06/23/2020   Procedure: BIOPSY;  Surgeon: Daneil Dolin, MD;  [facility];  Service: Endoscopy;;   CARDIAC CATHETERIZATION  12/11/2012   normal coronary arteries   CESAREAN SECTION  1994;2000   [place]   COLONOSCOPY WITH PROPOFOL N/A 06/23/2020   large amount of stool in entire colon, prep inadequate.    CYSTOSCOPY W/ URETERAL STENT PLACEMENT  05/08/2012   Procedure: CYSTOSCOPY WITH RETROGRADE PYELOGRAM/URETERAL STENT PLACEMENT;  Surgeon: Marissa Nestle, MD;  Location: [facility];  Service: Urology;  Laterality: Right;   CYSTOSCOPY WITH RETROGRADE PYELOGRAM, URETEROSCOPY AND STENT PLACEMENT Right 05/04/2020   Procedure: CYSTOSCOPY WITH RIGHT RETROGRADE PYELOGRAM, RIGHT URETEROSCOPY AND RIGHT URETERAL STENT EXCHANGE;  Surgeon: Cleon Gustin, MD;  Location:  [facility];  Service: Urology;  Laterality: Right;   CYSTOSCOPY WITH RETROGRADE PYELOGRAM, URETEROSCOPY AND STENT PLACEMENT Left 08/08/2020   Procedure: CYSTOSCOPY WITH RETROGRADE PYELOGRAM, URETEROSCOPY WITH LASER  AND STENT PLACEMENT;  Surgeon: Cleon Gustin, MD;  Location: [facility];  Service: Urology;  Laterality: Left;   CYSTOSCOPY WITH RETROGRADE PYELOGRAM, URETEROSCOPY AND STENT PLACEMENT Right 11/08/2020   Procedure: CYSTOSCOPY WITH RIGHT RETROGRADE PYELOGRAM, RIGHT URETEROSCOPY AND STENT PLACEMENT;  Surgeon: Cleon Gustin, MD;  Location: [facility];  Service: Urology;  Laterality: Right;   CYSTOSCOPY WITH RETROGRADE PYELOGRAM, URETEROSCOPY AND STENT PLACEMENT Left 12/27/2020   Procedure: CYSTOSCOPY WITH LEFT RETROGRADE PYELOGRAM, LEFT URETEROSCOPY AND LEFT URETERAL STENT PLACEMENT;  Surgeon: Cleon Gustin, MD;  Location[facility];  Service: Urology;  Laterality: Left;   CYSTOSCOPY WITH RETROGRADE PYELOGRAM, URETEROSCOPY AND STENT PLACEMENT Bilateral 03/16/2021   Procedure: CYSTOSCOPY WITH BILATERAL  RETROGRADE PYELOGRAM, BILATERAL URETEROSCOPY AND STENT PLACEMENT ON THE LEFT;  Surgeon: Cleon Gustin, MD;  [facility];  Service: Urology;  Laterality: Bilateral;   CYSTOSCOPY WITH RETROGRADE PYELOGRAM, URETEROSCOPY AND STENT PLACEMENT Left 06/28/2022   Procedure: CYSTOSCOPY WITH RETROGRADE PYELOGRAM, URETEROSCOPY AND STENT PLACEMENT;  Surgeon: Cleon Gustin, MD;  [facility];  Service: Urology;  Laterality: Left;   CYSTOSCOPY WITH RETROGRADE PYELOGRAM, URETEROSCOPY AND STENT PLACEMENT Bilateral 09/27/2022   Procedure: CYSTOSCOPY WITH BILATERAL RETROGRADE PYELOGRAM, URETEROSCOPY AND STENT PLACEMENT;  Surgeon: Cleon Gustin, MD;  Location: [facility];  Service: Urology;  Laterality: Bilateral;  pt knows to arrive at 6:00   [place] Right 02/25/2020   Procedure: CYSTOSCOPY WITH RIGHT RETROGRADE RIGHT STENT PLACEMENT;  Surgeon: Festus Aloe, MD;   Location: [facility];  Service: Urology;  Laterality: Right;   ESOPHAGOGASTRODUODENOSCOPY (EGD) WITH PROPOFOL N/A 06/23/2020   Normal esophagus, multiple localized erosions were found in gastric antrum s/p biopsy, normal duodenum. Empiric dilation.Reactive gastropathy with erosions, negative H.pylori, no dysplasia.     EXTRACORPOREAL SHOCK WAVE LITHOTRIPSY Right 10/11/2020   Procedure: EXTRACORPOREAL SHOCK WAVE LITHOTRIPSY (ESWL);  Surgeon: Cleon Gustin, MD;  Location: [facility];  Service: Urology;  Laterality: Right;   FOOT SURGERY Right March, 2013   [facility]-removal of bone spur   HOLMIUM LASER APPLICATION  02/04/3334   Procedure: HOLMIUM LASER LITHOTRIPSY RIGHT URETERAL CALCULUS;  Surgeon: Cleon Gustin, MD;  Location: [facility];  Service: Urology;;   HYSTEROSCOPY WITH THERMACHOICE  04/25/2012   Procedure: HYSTEROSCOPY WITH THERMACHOICE;  Surgeon: Florian Buff, MD;  Location: [facility];  Service: Gynecology;  Laterality: N/A;  total therapy time= 11 minutes 42 seconds; 34 ml D5w  in and 34 ml D5w out; temp =87 degrees F   LEFT HEART CATHETERIZATION WITH CORONARY ANGIOGRAM N/A 12/11/2012   Procedure: LEFT HEART CATHETERIZATION WITH CORONARY ANGIOGRAM;  Surgeon: Lorretta Harp, MD;  Location: [facility] CATH [facility];  Service: Cardiovascular;  Laterality: N/A;   LUMBAR WOUND DEBRIDEMENT N/A 12/14/2021   Procedure: LUMBAR HEMATOMA EVACUATION;  Surgeon: Judith Part, MD;  Location: [place];  Service: Neurosurgery;  Laterality: N/A;   MALONEY DILATION N/A 06/23/2020   Procedure: Venia Minks DILATION;  Surgeon: Daneil Dolin, MD;  [facility];  Service: Endoscopy;  Laterality: N/A;   Sinus sergery  1988   [place]   STONE EXTRACTION WITH BASKET Right 11/08/2020   Procedure: STONE EXTRACTION WITH BASKET;  Surgeon: Cleon Gustin, MD;  Location: [facility];  Service: Urology;  Laterality: Right;   TRANSFORAMINAL LUMBAR INTERBODY FUSION (TLIF) WITH PEDICLE SCREW FIXATION 1 LEVEL N/A 12/05/2021    Procedure: Open Lumbar five-Sacral one Laminectomy/Transforaminal Lumbar Interbody Fusion/Posterolateral instrumented fusion;  Surgeon: Judith Part, MD;  Location: [place];  Service: Neurosurgery;  Laterality: N/A;  3C   UMBILICAL HERNIA REPAIR N/A 03/25/2014   Procedure: UMBILICAL HERNIA REPAIR;  Surgeon: Scherry Ran, MD;  Location: [facility];  Service: General;  Laterality: N/A;   uterine ablation      Home Medications:  Allergies as of 12/25/2022       Reactions   Ciprofloxacin Hives, Swelling   Penicillins Anaphylaxis, Rash   Codeine Other (See Comments)   had hallicunations   Morphine Nausea And Vomiting, Other (See Comments)   recieved in ED due to [facility] and had multple doses - gave visual hallucinations and vomitting.   Sulfonamide Derivatives Other (See Comments)   REACTION: Unsure - childhood allergy   Vancomycin Itching        Medication List        Accurate as of December 25, 2022  4:05 PM. If you have any questions, ask your nurse or doctor.          albuterol 108 (90 Base) MCG/ACT inhaler Commonly known as: VENTOLIN HFA Inhale 2 puffs into the lungs every 6 (six) hours as needed for wheezing or shortness of breath.   blood glucose meter kit and supplies Use up to four times daily as directed.   cyclobenzaprine 10 MG  tablet Commonly known as: FLEXERIL Take 1 tablet (10 mg total) by mouth 3 (three) times daily as needed for muscle spasms.   fluconazole 150 MG tablet Commonly known as: DIFLUCAN Take 1 tablet (150 mg total) by mouth every 3 (three) days.   freestyle lancets Use to test 4 times daily   furosemide 20 MG tablet Commonly known as: LASIX Take 20 mg by mouth 2 (two) times daily as needed for edema.   gabapentin 600 MG tablet Commonly known as: NEURONTIN Take 1 tablet (600 mg total) by mouth 3 (three) times daily.   glucose blood test strip Test up to 4 times a day   hydrOXYzine 50 MG capsule Commonly known as:  VISTARIL TAKE 1 CAPSULE(50 MG) BY MOUTH AT BEDTIME   indapamide 2.5 MG tablet Commonly known as: LOZOL Take 1 tablet (2.5 mg total) by mouth daily.   lisinopril-hydrochlorothiazide 10-12.5 MG tablet Commonly known as: ZESTORETIC Take 1 tablet by mouth daily.   meclizine 25 MG tablet Commonly known as: ANTIVERT TAKE 1 TABLET(25 MG) BY MOUTH THREE TIMES DAILY AS NEEDED FOR DIZZINESS OR NAUSEA   omeprazole 20 MG capsule Commonly known as: PRILOSEC Take 1 capsule (20 mg total) by mouth daily.   ondansetron 4 MG tablet Commonly known as: Zofran Take 1 tablet (4 mg total) by mouth daily as needed for nausea or vomiting.   oxyCODONE 5 MG immediate release tablet Commonly known as: Oxy IR/ROXICODONE Take 1 tablet (5 mg total) by mouth every 4 (four) hours as needed (pain).   Potassium Citrate 15 MEQ (1620 MG) Tbcr Commonly known as: Urocit-K 15 Take 1 tablet by mouth in the morning and at bedtime.   promethazine 25 MG tablet Commonly known as: PHENERGAN TAKE 1 TABLET(25 MG) BY MOUTH EVERY 8 HOURS AS NEEDED FOR NAUSEA OR VOMITING   Quviviq 50 MG Tabs Generic drug: Daridorexant HCl Take 1 tablet by mouth at bedtime.   simvastatin 40 MG tablet Commonly known as: ZOCOR Take 1 tablet (40 mg total) by mouth every evening for cholesterol   Synjardy 12.5-500 MG Tabs Generic drug: Empagliflozin-metFORMIN HCl Take 1 tablet by mouth daily.   Trulicity 3 MG/0.5ML Sopn Generic drug: Dulaglutide Inject 3 mg into the skin once a week.        Allergies:  Allergies  Allergen Reactions   Ciprofloxacin Hives and Swelling   Penicillins Anaphylaxis and Rash   Codeine Other (See Comments)    had hallicunations   Morphine Nausea And Vomiting and Other (See Comments)    recieved in ED due to [facility] and had multple doses - gave visual hallucinations and vomitting.   Sulfonamide Derivatives Other (See Comments)    REACTION: Unsure - childhood allergy   Vancomycin Itching     Family History: Family History  Problem Relation Age of Onset   Hypertension Mother    Cancer Mother    Diabetes Mother    Stroke Mother    Diabetes Father    Depression Paternal Grandmother    Depression Paternal Grandfather    Cancer Paternal Uncle    Heart disease Other    Cancer Other    Diabetes Other    Achalasia Other    Anesthesia problems Neg Hx    Hypotension Neg Hx    Malignant hyperthermia Neg Hx    Pseudochol deficiency Neg Hx    Colon cancer Neg Hx    Colon polyps Neg Hx     Social History:  reports that she has never  smoked. She has never used smokeless tobacco. She reports that she does not drink alcohol and does not use drugs.  ROS: All other review of systems were reviewed and are negative except what is noted above in HPI  Physical Exam: BP 125/80   Pulse 71   Ht 4\' 11"  (1.499 m)   Wt 176 lb (79.8 kg)   LMP 07/16/2012   BMI 35.55 kg/m   Constitutional:  Alert and oriented, No acute distress. HEENT: [place] AT, moist mucus membranes.  Trachea midline, no masses. Cardiovascular: No clubbing, cyanosis, or edema. Respiratory: Normal respiratory effort, no increased work of breathing. GI: Abdomen is soft, nontender, nondistended, no abdominal masses GU: No CVA tenderness.  Lymph: No cervical or inguinal lymphadenopathy. Skin: No rashes, bruises or suspicious lesions. Neurologic: Grossly intact, no focal deficits, moving all 4 extremities. Psychiatric: Normal mood and affect.  Laboratory Data: Lab Results  Component Value Date   WBC 7.8 07/02/2022   HGB 14.2 07/02/2022   HCT 43.9 07/02/2022   MCV 85.4 07/02/2022   PLT 302 07/02/2022    Lab Results  Component Value Date   CREATININE 0.79 11/21/2022    No results found for: "PSA"  No results found for: "TESTOSTERONE"  Lab Results  Component Value Date   HGBA1C 6.2 (H) 09/25/2022    Urinalysis    Component Value Date/Time   COLORURINE STRAW (A) 07/02/2022 0717   APPEARANCEUR CLEAR  07/02/2022 0717   APPEARANCEUR Clear 04/16/2022 1533   LABSPEC 1.003 (L) 07/02/2022 0717   PHURINE 6.0 07/02/2022 0717   GLUCOSEU >=500 (A) 07/02/2022 0717   HGBUR LARGE (A) 07/02/2022 0717   HGBUR moderate 08/31/2008 0908   BILIRUBINUR NEGATIVE 07/02/2022 0717   BILIRUBINUR Negative 04/16/2022 1533   KETONESUR NEGATIVE 07/02/2022 0717   PROTEINUR NEGATIVE 07/02/2022 0717   UROBILINOGEN 0.2 12/14/2020 0835   UROBILINOGEN 0.2 09/21/2014 1055   NITRITE NEGATIVE 07/02/2022 0717   LEUKOCYTESUR NEGATIVE 07/02/2022 0717    Lab Results  Component Value Date   LABMICR Comment 04/16/2022   WBCUA 6-10 (A) 01/05/2022   LABEPIT 0-10 01/05/2022   BACTERIA NONE SEEN 07/02/2022    Pertinent Imaging: Renal US 12/21/2022: Images reviewed and discussed with the patient  Results for orders placed during the hospital encounter of 05/17/22  DG Abdomen 1 View  Narrative CLINICAL DATA:  Left-sided abdominal and flank pain. History of kidney stones.  EXAM: ABDOMEN - 1 VIEW  COMPARISON:  Ultrasound 04/12/2022. CT 02/01/2021  FINDINGS: Bowel gas pattern is normal. Cholecystectomy clips in place. Lower spinal fusion. No visible urinary tract calculi.  IMPRESSION: No visible urinary tract calculi. Sensitivity for small stones would be limited given patient's size and bowel contents.   Electronically Signed By: Nelson Chimes M.D. On: 05/17/2022 08:30  No results found for this or any previous visit.  No results found for this or any previous visit.  No results found for this or any previous visit.  Results for orders placed during the hospital encounter of 12/21/22  Ultrasound renal complete  Narrative CLINICAL DATA:  Nephrolithiasis.  EXAM: RENAL / URINARY TRACT ULTRASOUND COMPLETE  COMPARISON:  Renal ultrasound May 17, 2022  FINDINGS: Right Kidney:  Renal measurements: 11.2 x 5.4 x 5.1 cm = volume: 163 mL. Echogenicity within normal limits. No mass or  hydronephrosis visualized.  Left Kidney:  Renal measurements: 11.5 x 5.9 x 4.6 cm = volume: 165 mL. An echogenic nonshadowing focus measuring 2.9 mm could represent artifact or a tiny nonshadowing stone.  No hydronephrosis.  Bladder:  Poorly evaluated due to lack of distention.  Other:  None.  IMPRESSION: 1. The right kidney is normal. 2. The left kidney is normal in appearance. A 2.9 mm echogenic nonshadowing focus in the left kidney could represent artifact or a tiny nonshadowing stone. 3. The bladder is poorly evaluated due to lack of distention.   Electronically Signed By: Dorise Bullion III M.D. On: 12/21/2022 16:24  No valid procedures specified. No results found for this or any previous visit.  Results for orders placed during the hospital encounter of 08/10/22  CT RENAL STONE STUDY  Narrative CLINICAL DATA:  Left flank pain  EXAM: CT ABDOMEN AND PELVIS WITHOUT CONTRAST  TECHNIQUE: Multidetector CT imaging of the abdomen and pelvis was performed following the standard protocol without IV contrast.  RADIATION DOSE REDUCTION: This exam was performed according to the departmental dose-optimization program which includes automated exposure control, adjustment of the mA and/or kV according to patient size and/or use of iterative reconstruction technique.  COMPARISON:  None Available.  FINDINGS: Lower chest: No acute abnormality.  Hepatobiliary: No focal liver abnormality is seen. Status post cholecystectomy. No biliary dilatation.  Pancreas: Unremarkable  Spleen: Unremarkable  Adrenals/Urinary Tract: The adrenal glands are unremarkable. The kidneys are normal in size and position. Scattered nonobstructing renal calculi are seen within the kidneys bilaterally measuring up to 5 mm within the lower pole of the right kidney and 4 mm within the interpolar region of the left kidney. No hydronephrosis. No ureteral calculi. The bladder is  unremarkable.  Stomach/Bowel: Stomach is within normal limits. Appendix appears normal. No evidence of bowel wall thickening, distention, or inflammatory changes.  Vascular/Lymphatic: Aortic atherosclerosis. No enlarged abdominal or pelvic lymph nodes.  Reproductive: Uterus and bilateral adnexa are unremarkable.  Other: No abdominal wall hernia.  No abdominopelvic ascites.  Musculoskeletal: Mild subcutaneous infiltration within the right lower quadrant abdominal wall is nonspecific, possibly related to local trauma or inflammation. L5-S1 not lumbar fusion with instrumentation has been performed. No acute bone abnormality. No lytic or blastic bone lesion.  IMPRESSION: 1. No acute intra-abdominal pathology identified. No definite radiographic explanation for the patient's reported symptoms. 2. Mild bilateral nonobstructing nephrolithiasis. No urolithiasis. No hydronephrosis.  Aortic Atherosclerosis (ICD10-I70.0).   Electronically Signed By: Fidela Salisbury M.D. On: 08/12/2022 22:01   Assessment & Plan:    1. Nephrolithiasis -followup 3 months with renal US  2. Flank pain -likely musculoskeletal. We will trial felxeril 5mg  TID prn - Urinalysis, Routine w reflex microscopic   No follow-ups on file.  Nicolette Bang, MD  [facility]

## 2022-12-26 LAB — URINALYSIS, ROUTINE W REFLEX MICROSCOPIC
Bilirubin, UA: NEGATIVE
Ketones, UA: NEGATIVE
Leukocytes,UA: NEGATIVE
Nitrite, UA: NEGATIVE
Protein,UA: NEGATIVE
RBC, UA: NEGATIVE
Specific Gravity, UA: 1.02 (ref 1.005–1.030)
Urobilinogen, Ur: 0.2 mg/dL (ref 0.2–1.0)
pH, UA: 5 (ref 5.0–7.5)

## 2022-12-28 ENCOUNTER — Other Ambulatory Visit (HOSPITAL_COMMUNITY): Payer: Self-pay

## 2023-01-01 ENCOUNTER — Ambulatory Visit (INDEPENDENT_AMBULATORY_CARE_PROVIDER_SITE_OTHER): Payer: 59 | Admitting: Family Medicine

## 2023-01-01 ENCOUNTER — Encounter: Payer: Self-pay | Admitting: Family Medicine

## 2023-01-01 ENCOUNTER — Encounter: Payer: Self-pay | Admitting: Urology

## 2023-01-01 VITALS — BP 127/78 | HR 83 | Ht <= 58 in | Wt 177.1 lb

## 2023-01-01 DIAGNOSIS — M1000 Idiopathic gout, unspecified site: Secondary | ICD-10-CM

## 2023-01-01 DIAGNOSIS — N898 Other specified noninflammatory disorders of vagina: Secondary | ICD-10-CM | POA: Diagnosis not present

## 2023-01-01 DIAGNOSIS — R159 Full incontinence of feces: Secondary | ICD-10-CM | POA: Diagnosis not present

## 2023-01-01 LAB — POCT URINALYSIS DIP (CLINITEK)
Bilirubin, UA: NEGATIVE
Blood, UA: NEGATIVE
Glucose, UA: >=1000 mg/dL — AB
Ketones, POC UA: NEGATIVE mg/dL
Leukocytes, UA: NEGATIVE
Nitrite, UA: NEGATIVE
POC PROTEIN,UA: NEGATIVE
Spec Grav, UA: 1.02 (ref 1.010–1.025)
Urobilinogen, UA: 0.2 E.U./dL
pH, UA: 5.5 (ref 5.0–8.0)

## 2023-01-01 MED ORDER — PREDNISONE 20 MG PO TABS: 40.0000 mg | ORAL_TABLET | Freq: Every day | ORAL | 0 refills | Status: AC

## 2023-01-01 MED ORDER — CLOTRIMAZOLE 1 % EX CREA: 1.0000 | TOPICAL_CREAM | Freq: Two times a day (BID) | CUTANEOUS | 0 refills | Status: DC

## 2023-01-01 NOTE — Assessment & Plan Note (Addendum)
No reported injury or trauma She reports waking up with onset of symptoms Symptom onset since 12/25/2022, however she reports worsening of her symptoms since 12/31/2022 Differential diagnosis includes arthritis, gout, and infection of the finger Will treat for gout given patient's physical examination and reported history Prednisone 40 mg daily for 5 days is ordered Encourage low purine diet Encouraged to return for labs in 2 to 4 weeks to assess uric acid levels once flareup has resolved

## 2023-01-01 NOTE — Progress Notes (Signed)
Urine

## 2023-01-01 NOTE — Patient Instructions (Addendum)
I appreciate the opportunity to provide care to you today!    Follow up:  with Dr. Posey Pronto in 4 weeks for fecal incontinence  Fecal Incontinence Supportive care -- Supportive measures include avoiding foods or activities known to worsen symptoms and improving perianal skin hygiene. This includes avoidance of incompletely digested sugars (eg, fructose, lactose) and caffeine. Patients should be advised to keep a food and symptom diary to identify factors that cause incontinence.  I recommend supplementing your diet with a Fiber supplements Benefiber 2 teaspoons mixed in 8 ounces of liquid daily to help with some bulk.  Gout flareup You will be treated today for gout flareup of the  DIP joint of you right fifth finger with prednisone 40 mg daily for 5 days Please return for labs in 2 weeks to assess your uric acid level  Limit your intake of foods high in purines, including: Beer and other alcohol. Meat-based gravy or sauce. Canned or fresh fish, such as: Anchovies, sardines, herring, salmon, and tuna. Mussels and scallops. Codfish, trout, and haddock. Bacon, veal, chicken breast with skin, and lamb. Organ meats, such as: Liver or kidney. Tripe. Sweetbreads (thymus gland or pancreas). Wild Clinical biochemist. Yeast or yeast extract supplements. Drinks sweetened with high-fructose corn syrup, such as soda. Processed foods made with high-fructose corn syrup.  Yeast infection You will be notified of the results of your vaginal swab Please pick up the topical clotrimazole 1% cream at the pharmacy     Please continue to a heart-healthy diet and increase your physical activities. Try to exercise for 75mins at least five times a week.      It was a pleasure to see you and I look forward to continuing to work together on your health and well-being. Please do not hesitate to call the office if you need care or have questions about your care.   Have a wonderful day and week. With  Gratitude, Alvira Monday MSN, FNP-BC

## 2023-01-01 NOTE — Assessment & Plan Note (Signed)
She complains of fecal incontinence Last episode on 01/01/2023 Symptoms occur infrequently She reports having loose stools with episodes and abdominal pain Education has follow: Fecal Incontinence Supportive care -- Supportive measures include avoiding foods or activities known to worsen symptoms and improving perianal skin hygiene. This includes avoidance of incompletely digested sugars (eg, fructose, lactose) and caffeine. Patients should be advised to keep a food and symptom diary to identify factors that cause incontinence.  I recommend supplementing your diet with a Fiber supplements Benefiber 2 teaspoons mixed in 8 ounces of liquid daily to help with some bulk Encouraged to follow-up with PCP in 4 weeks

## 2023-01-01 NOTE — Assessment & Plan Note (Signed)
UA is negative for nitrates and leukocytes She reports having urinary urgency every 3 days She complains of vulvar pruritus She reports not being sexually active She denies vaginal discharge or odor She denies urgency, frequency, and pain with urination Will treat with topical antifungal cream (clotrimazole 1% cream) Pending NuSwab

## 2023-01-01 NOTE — Progress Notes (Signed)
Acute Office Visit  Subjective:    Patient ID: [name], female    DOB: 07-27-1966, 57 y.o.   MRN: [id number]  Chief Complaint  Patient presents with   Vaginal Itching    Pt reports vaginal itching and irritation since 12/25/2022.   Hand Pain    Pinky finger on right hand is red and swollen onset was 12/25/2022.    HPI Patient is in today with complaints of fecal incontinence, hand pain, and vaginal itching. For the details of today's visit, please refer to the assessment and plan.     Past Medical History:  Diagnosis Date   Diabetes mellitus    insulin pump 2013   Dysphagia 03/09/2020   Epidural hematoma ([place]) 12/14/2021   GERD (gastroesophageal reflux disease)    History of kidney stones    Hypercholesteremia    Hypertension    Incarcerated umbilical hernia 40/98/1191   Kidney stone    Kidney stones 08/31/2008   Qualifier: Diagnosis of  By: Jonna Munro MD, Cornelius     Neuropathy    Obesity    Palpitations    Shortness of breath    Vertigo     Past Surgical History:  Procedure Laterality Date   BIOPSY  06/23/2020   Procedure: BIOPSY;  Surgeon: Daneil Dolin, MD;  [facility];  Service: Endoscopy;;   CARDIAC CATHETERIZATION  12/11/2012   normal coronary arteries   CESAREAN SECTION  1994;2000   [place]   COLONOSCOPY WITH PROPOFOL N/A 06/23/2020   large amount of stool in entire colon, prep inadequate.    CYSTOSCOPY W/ URETERAL STENT PLACEMENT  05/08/2012   Procedure: CYSTOSCOPY WITH RETROGRADE PYELOGRAM/URETERAL STENT PLACEMENT;  Surgeon: Marissa Nestle, MD;  Location: [facility];  Service: Urology;  Laterality: Right;   CYSTOSCOPY WITH RETROGRADE PYELOGRAM, URETEROSCOPY AND STENT PLACEMENT Right 05/04/2020   Procedure: CYSTOSCOPY WITH RIGHT RETROGRADE PYELOGRAM, RIGHT URETEROSCOPY AND RIGHT URETERAL STENT EXCHANGE;  Surgeon: Cleon Gustin, MD;  Location: [facility];  Service: Urology;  Laterality: Right;   CYSTOSCOPY WITH  RETROGRADE PYELOGRAM, URETEROSCOPY AND STENT PLACEMENT Left 08/08/2020   Procedure: CYSTOSCOPY WITH RETROGRADE PYELOGRAM, URETEROSCOPY WITH LASER  AND STENT PLACEMENT;  Surgeon: Cleon Gustin, MD;  Location: [facility];  Service: Urology;  Laterality: Left;   CYSTOSCOPY WITH RETROGRADE PYELOGRAM, URETEROSCOPY AND STENT PLACEMENT Right 11/08/2020   Procedure: CYSTOSCOPY WITH RIGHT RETROGRADE PYELOGRAM, RIGHT URETEROSCOPY AND STENT PLACEMENT;  Surgeon: Cleon Gustin, MD;  [facility]: [facility];  Service: Urology;  Laterality: Right;   CYSTOSCOPY WITH RETROGRADE PYELOGRAM, URETEROSCOPY AND STENT PLACEMENT Left 12/27/2020   Procedure: CYSTOSCOPY WITH LEFT RETROGRADE PYELOGRAM, LEFT URETEROSCOPY AND LEFT URETERAL STENT PLACEMENT;  Surgeon: Cleon Gustin, MD;  [facility]: [facility];  Service: Urology;  Laterality: Left;   CYSTOSCOPY WITH RETROGRADE PYELOGRAM, URETEROSCOPY AND STENT PLACEMENT Bilateral 03/16/2021   Procedure: CYSTOSCOPY WITH BILATERAL  RETROGRADE PYELOGRAM, BILATERAL URETEROSCOPY AND STENT PLACEMENT ON THE LEFT;  Surgeon: Cleon Gustin, MD;  [facility];  Service: Urology;  Laterality: Bilateral;   CYSTOSCOPY WITH RETROGRADE PYELOGRAM, URETEROSCOPY AND STENT PLACEMENT Left 06/28/2022   Procedure: CYSTOSCOPY WITH RETROGRADE PYELOGRAM, URETEROSCOPY AND STENT PLACEMENT;  Surgeon: Cleon Gustin, MD;  [facility];  Service: Urology;  Laterality: Left;   CYSTOSCOPY WITH RETROGRADE PYELOGRAM, URETEROSCOPY AND STENT PLACEMENT Bilateral 09/27/2022   Procedure: CYSTOSCOPY WITH BILATERAL RETROGRADE PYELOGRAM, URETEROSCOPY AND STENT PLACEMENT;  Surgeon: Cleon Gustin, MD;  Location: [facility];  Service: Urology;  Laterality: Bilateral;  pt knows to arrive at 6:00   [place] Right 02/25/2020   Procedure: CYSTOSCOPY WITH RIGHT RETROGRADE RIGHT STENT PLACEMENT;  Surgeon: Festus Aloe, MD;  Location: [facility];  Service: Urology;  Laterality: Right;    ESOPHAGOGASTRODUODENOSCOPY (EGD) WITH PROPOFOL N/A 06/23/2020   Normal esophagus, multiple localized erosions were found in gastric antrum s/p biopsy, normal duodenum. Empiric dilation.Reactive gastropathy with erosions, negative H.pylori, no dysplasia.     EXTRACORPOREAL SHOCK WAVE LITHOTRIPSY Right 10/11/2020   Procedure: EXTRACORPOREAL SHOCK WAVE LITHOTRIPSY (ESWL);  Surgeon: Cleon Gustin, MD;  Location: [facility];  Service: Urology;  Laterality: Right;   FOOT SURGERY Right March, 2013   [facility]-removal of bone spur   HOLMIUM LASER APPLICATION  4/69/6295   Procedure: HOLMIUM LASER LITHOTRIPSY RIGHT URETERAL CALCULUS;  Surgeon: Cleon Gustin, MD;  Location: [facility];  Service: Urology;;   HYSTEROSCOPY WITH THERMACHOICE  04/25/2012   Procedure: HYSTEROSCOPY WITH THERMACHOICE;  Surgeon: Florian Buff, MD;  Location: [facility];  Service: Gynecology;  Laterality: N/A;  total therapy time= 11 minutes 42 seconds; 34 ml D5w  in and 34 ml D5w out; temp =87 degrees F   LEFT HEART CATHETERIZATION WITH CORONARY ANGIOGRAM N/A 12/11/2012   Procedure: LEFT HEART CATHETERIZATION WITH CORONARY ANGIOGRAM;  Surgeon: Lorretta Harp, MD;  Location[facility];  Service: Cardiovascular;  Laterality: N/A;   LUMBAR WOUND DEBRIDEMENT N/A 12/14/2021   Procedure: LUMBAR HEMATOMA EVACUATION;  Surgeon: Judith Part, MD;  Location: [place];  Service: Neurosurgery;  Laterality: N/A;   MALONEY DILATION N/A 06/23/2020   Procedure: Venia Minks DILATION;  Surgeon: Daneil Dolin, MD;  [facility];  Service: Endoscopy;  Laterality: N/A;   Sinus sergery  1988   [place]   STONE EXTRACTION WITH BASKET Right 11/08/2020   Procedure: STONE EXTRACTION WITH BASKET;  Surgeon: Cleon Gustin, MD;  Location: [facility];  Service: Urology;  Laterality: Right;   TRANSFORAMINAL LUMBAR INTERBODY FUSION (TLIF) WITH PEDICLE SCREW FIXATION 1 LEVEL N/A 12/05/2021   Procedure: Open Lumbar five-Sacral one  Laminectomy/Transforaminal Lumbar Interbody Fusion/Posterolateral instrumented fusion;  Surgeon: Judith Part, MD;  Location: [place];  Service: Neurosurgery;  Laterality: N/A;  3C   UMBILICAL HERNIA REPAIR N/A 03/25/2014   Procedure: UMBILICAL HERNIA REPAIR;  Surgeon: Scherry Ran, MD;  Location: [facility];  Service: General;  Laterality: N/A;   uterine ablation      Family History  Problem Relation Age of Onset   Hypertension Mother    Cancer Mother    Diabetes Mother    Stroke Mother    Diabetes Father    Depression Paternal Grandmother    Depression Paternal Grandfather    Cancer Paternal Uncle    Heart disease Other    Cancer Other    Diabetes Other    Achalasia Other    Anesthesia problems Neg Hx    Hypotension Neg Hx    Malignant hyperthermia Neg Hx    Pseudochol deficiency Neg Hx    Colon cancer Neg Hx    Colon polyps Neg Hx     Social History   Socioeconomic History   Marital status: Married    Spouse name: Not on file   Number of children: 2   Years of education: college   Highest education level: Not on file  Occupational History   Occupation: CNA    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: [employer]  Occupation: CNA- at [employer]  Tobacco Use   Smoking status: Never   Smokeless tobacco: Never  Vaping Use   Vaping Use: Never used  Substance and Sexual Activity   Alcohol use: No   Drug use: No   Sexual activity: Not Currently    Partners: Male    Birth control/protection: Post-menopausal    Comment: ablation  Other Topics Concern   Not on file  Social History Narrative   Regular exercise: walks Caffeine use:    Social Determinants of Health   Financial Resource Strain: Low Risk  (01/11/2022)   Overall Financial Resource Strain (CARDIA)    Difficulty of Paying Living Expenses: Not hard at all  Food Insecurity: Food Insecurity Present (01/11/2022)   Hunger Vital Sign    Worried About Running Out of Food in the Last Year:  Sometimes true    Ran Out of Food in the Last Year: Never true  Transportation Needs: No Transportation Needs (01/11/2022)   PRAPARE - Administrator, Civil Service (Medical): No    Lack of Transportation (Non-Medical): No  Physical Activity: Insufficiently Active (01/11/2022)   Exercise Vital Sign    Days of Exercise per Week: 2 days    Minutes of Exercise per Session: 20 min  Stress: Stress Concern Present (01/11/2022)   [employer] of Occupational Health - Occupational Stress Questionnaire    Feeling of Stress : To some extent  Social Connections: Moderately Integrated (01/11/2022)   Social Connection and Isolation Panel [NHANES]    Frequency of Communication with Friends and Family: Three times a week    Frequency of Social Gatherings with Friends and Family: Twice a week    Attends Religious Services: 1 to 4 times per year    Active Member of [employer] or Organizations: No    Attends Banker Meetings: Never    Marital Status: Married  Catering manager Violence: Not At Risk (01/11/2022)   Humiliation, Afraid, Rape, and Kick questionnaire    Fear of Current or Ex-Partner: No    Emotionally Abused: No    Physically Abused: No    Sexually Abused: No    Outpatient Medications Prior to Visit  Medication Sig Dispense Refill   albuterol (VENTOLIN HFA) 108 (90 Base) MCG/ACT inhaler Inhale 2 puffs into the lungs every 6 (six) hours as needed for wheezing or shortness of breath.      blood glucose meter kit and supplies Use up to four times daily as directed. 1 each 0   cyclobenzaprine (FLEXERIL) 10 MG tablet Take 1 tablet (10 mg total) by mouth 3 (three) times daily as needed for muscle spasms. 30 tablet 0   Daridorexant HCl (QUVIVIQ) 50 MG TABS Take 1 tablet by mouth at bedtime. 30 tablet 3   Dulaglutide (TRULICITY) 3 MG/0.5ML SOPN Inject 3 mg into the skin once a week. 6 mL 1   Empagliflozin-metFORMIN HCl (SYNJARDY) 12.5-500 MG TABS Take 1 tablet by mouth daily. 90  tablet 0   fluconazole (DIFLUCAN) 150 MG tablet Take 1 tablet (150 mg total) by mouth every 3 (three) days. 2 tablet 0   furosemide (LASIX) 20 MG tablet Take 20 mg by mouth 2 (two) times daily as needed for edema.     gabapentin (NEURONTIN) 600 MG tablet Take 1 tablet (600 mg total) by mouth 3 (three) times daily. 90 tablet 2   glucose blood test strip Test up to 4 times a day 100 each 0   hydrOXYzine (VISTARIL)  50 MG capsule TAKE 1 CAPSULE(50 MG) BY MOUTH AT BEDTIME 30 capsule 0   indapamide (LOZOL) 2.5 MG tablet Take 1 tablet (2.5 mg total) by mouth daily. 30 tablet 11   Lancets (FREESTYLE) lancets Use to test 4 times daily 100 each 0   lisinopril-hydrochlorothiazide (ZESTORETIC) 10-12.5 MG tablet Take 1 tablet by mouth daily. 90 tablet 1   meclizine (ANTIVERT) 25 MG tablet TAKE 1 TABLET(25 MG) BY MOUTH THREE TIMES DAILY AS NEEDED FOR DIZZINESS OR NAUSEA 30 tablet 0   omeprazole (PRILOSEC) 20 MG capsule Take 1 capsule (20 mg total) by mouth daily. 90 capsule 1   ondansetron (ZOFRAN) 4 MG tablet Take 1 tablet (4 mg total) by mouth daily as needed for nausea or vomiting. 30 tablet 1   oxyCODONE (OXY IR/ROXICODONE) 5 MG immediate release tablet Take 1 tablet (5 mg total) by mouth every 4 (four) hours as needed (pain). 30 tablet 0   Potassium Citrate (UROCIT-K 15) 15 MEQ (1620 MG) TBCR Take 1 tablet by mouth in the morning and at bedtime. 60 tablet 11   promethazine (PHENERGAN) 25 MG tablet TAKE 1 TABLET(25 MG) BY MOUTH EVERY 8 HOURS AS NEEDED FOR NAUSEA OR VOMITING 20 tablet 0   simvastatin (ZOCOR) 40 MG tablet Take 1 tablet (40 mg total) by mouth every evening for cholesterol 90 tablet 3   No facility-administered medications prior to visit.    Allergies  Allergen Reactions   Ciprofloxacin Hives and Swelling   Penicillins Anaphylaxis and Rash   Codeine Other (See Comments)    had hallicunations   Morphine Nausea And Vomiting and Other (See Comments)    recieved in ED due to [facility]  and had multple doses - gave visual hallucinations and vomitting.   Sulfonamide Derivatives Other (See Comments)    REACTION: Unsure - childhood allergy   Vancomycin Itching    Review of Systems  Constitutional:  Negative for chills and fever.  Eyes:  Negative for visual disturbance.  Respiratory:  Negative for chest tightness and shortness of breath.   Gastrointestinal:  Negative for constipation.       Incontinence  Genitourinary:  Negative for vaginal pain.       Vaginal itching  Musculoskeletal:  Positive for arthralgias.  Neurological:  Negative for dizziness and headaches.       Objective:    Physical Exam HENT:     Head: Normocephalic.     Mouth/Throat:     Mouth: Mucous membranes are moist.  Cardiovascular:     Rate and Rhythm: Normal rate.     Heart sounds: Normal heart sounds.  Pulmonary:     Effort: Pulmonary effort is normal.     Breath sounds: Normal breath sounds.  Musculoskeletal:     Comments: Redness, swelling, and tenderness to palpation of the DIP joint of the right fifth finger Pain with flexion of the finger and limited range of motion  Neurological:     Mental Status: She is alert.     BP 127/78   Pulse 83   Ht 4\' 8"  (1.422 m)   Wt 177 lb 1.9 oz (80.3 kg)   LMP 07/16/2012   SpO2 98%   BMI 39.71 kg/m  Wt Readings from Last 3 Encounters:  01/01/23 177 lb 1.9 oz (80.3 kg)  12/25/22 176 lb (79.8 kg)  11/27/22 176 lb 6.4 oz (80 kg)       Assessment & Plan:  Incontinence of feces, unspecified fecal incontinence type Assessment & Plan: She  complains of fecal incontinence Last episode on 01/01/2023 Symptoms occur infrequently She reports having loose stools with episodes and abdominal pain Education has follow: Fecal Incontinence Supportive care -- Supportive measures include avoiding foods or activities known to worsen symptoms and improving perianal skin hygiene. This includes avoidance of incompletely digested sugars (eg, fructose,  lactose) and caffeine. Patients should be advised to keep a food and symptom diary to identify factors that cause incontinence.  I recommend supplementing your diet with a Fiber supplements Benefiber 2 teaspoons mixed in 8 ounces of liquid daily to help with some bulk Encouraged to follow-up with PCP in 4 weeks    Vaginal itching Assessment & Plan: UA is negative for nitrates and leukocytes She reports having urinary urgency every 3 days She complains of vulvar pruritus She reports not being sexually active She denies vaginal discharge or odor She denies urgency, frequency, and pain with urination Will treat with topical antifungal cream (clotrimazole 1% cream) Pending NuSwab  Orders: -     POCT URINALYSIS DIP (CLINITEK) -     Clotrimazole; Apply 1 Application topically 2 (two) times daily.  Dispense: 30 g; Refill: 0 -     NuSwab Vaginitis Plus (VG+)  Acute idiopathic gout, unspecified site Assessment & Plan: No reported injury or trauma She reports waking up with onset of symptoms Symptom onset since 12/25/2022, however she reports worsening of her symptoms since 12/31/2022 Differential diagnosis includes arthritis, gout, and infection of the finger Will treat for gout given patient's physical examination and reported history Prednisone 40 mg daily for 5 days is ordered Encourage low purine diet Encouraged to return for labs in 2 to 4 weeks to assess uric acid levels once flareup has resolved   Orders: -     predniSONE; Take 2 tablets (40 mg total) by mouth daily with breakfast for 5 days.  Dispense: 10 tablet; Refill: 0 -     Uric acid    Gilmore Laroche, FNP

## 2023-01-01 NOTE — Patient Instructions (Signed)

## 2023-01-02 ENCOUNTER — Other Ambulatory Visit: Payer: Self-pay

## 2023-01-02 ENCOUNTER — Telehealth: Payer: Self-pay | Admitting: Family Medicine

## 2023-01-02 DIAGNOSIS — N898 Other specified noninflammatory disorders of vagina: Secondary | ICD-10-CM | POA: Diagnosis not present

## 2023-01-02 DIAGNOSIS — N760 Acute vaginitis: Secondary | ICD-10-CM

## 2023-01-02 MED ORDER — FLUCONAZOLE 150 MG PO TABS: 150.0000 mg | ORAL_TABLET | ORAL | 0 refills | Status: DC

## 2023-01-02 NOTE — Telephone Encounter (Signed)
Left vm letting pt know refill was sent

## 2023-01-02 NOTE — Telephone Encounter (Signed)
Pt informed of this, states understanding.

## 2023-01-02 NOTE — Telephone Encounter (Signed)
Patient called asking was provider goint ot call this medicine into her pharmacy due to she had yeast infection and on other medicine. fluconazole (DIFLUCAN) 150 MG tablet  Any questions contact patient at [phone].  Pharmacy  [facility], [address], [place] [address] Phone: [phone]  Fax: [phone]

## 2023-01-05 ENCOUNTER — Other Ambulatory Visit: Payer: Self-pay | Admitting: Family Medicine

## 2023-01-05 NOTE — Progress Notes (Signed)
Kindly Informed the patient that her swab was positive for yeast infection.  Please encourage her to continue on therapy.

## 2023-01-06 ENCOUNTER — Other Ambulatory Visit: Payer: Self-pay | Admitting: Internal Medicine

## 2023-01-06 ENCOUNTER — Other Ambulatory Visit: Payer: Self-pay | Admitting: Family Medicine

## 2023-01-06 DIAGNOSIS — N760 Acute vaginitis: Secondary | ICD-10-CM

## 2023-01-06 DIAGNOSIS — M1000 Idiopathic gout, unspecified site: Secondary | ICD-10-CM

## 2023-01-07 ENCOUNTER — Other Ambulatory Visit: Payer: Self-pay

## 2023-01-07 ENCOUNTER — Telehealth: Payer: Self-pay | Admitting: Family Medicine

## 2023-01-07 ENCOUNTER — Other Ambulatory Visit (HOSPITAL_COMMUNITY): Payer: Self-pay

## 2023-01-07 DIAGNOSIS — N760 Acute vaginitis: Secondary | ICD-10-CM

## 2023-01-07 MED ORDER — FLUCONAZOLE 150 MG PO TABS: 150.0000 mg | ORAL_TABLET | ORAL | 0 refills | Status: DC

## 2023-01-07 MED ORDER — GABAPENTIN 600 MG PO TABS
600.0000 mg | ORAL_TABLET | Freq: Three times a day (TID) | ORAL | 2 refills | Status: DC
  Filled 2023-01-07 – 2023-01-10 (×2): qty 90, 30d supply, fill #0
  Filled 2023-02-12 (×2): qty 90, 30d supply, fill #1
  Filled 2023-03-19: qty 90, 30d supply, fill #2

## 2023-01-07 NOTE — Telephone Encounter (Signed)
Patient called asked if provider was going to call something in for her uti labs came back abnormal patient said.   Pharmacy: [place]

## 2023-01-07 NOTE — Telephone Encounter (Signed)
Spoke to pt sent diflucan in for her.

## 2023-01-08 ENCOUNTER — Other Ambulatory Visit (HOSPITAL_COMMUNITY): Payer: Self-pay

## 2023-01-09 LAB — NUSWAB VAGINITIS PLUS (VG+)
Candida albicans, NAA: POSITIVE — AB
Candida glabrata, NAA: POSITIVE — AB

## 2023-01-10 ENCOUNTER — Other Ambulatory Visit: Payer: Self-pay | Admitting: Internal Medicine

## 2023-01-10 ENCOUNTER — Other Ambulatory Visit: Payer: Self-pay

## 2023-01-10 ENCOUNTER — Telehealth (INDEPENDENT_AMBULATORY_CARE_PROVIDER_SITE_OTHER): Payer: 59 | Admitting: Internal Medicine

## 2023-01-10 ENCOUNTER — Encounter: Payer: Self-pay | Admitting: Internal Medicine

## 2023-01-10 VITALS — Temp 99.0°F

## 2023-01-10 DIAGNOSIS — K529 Noninfective gastroenteritis and colitis, unspecified: Secondary | ICD-10-CM | POA: Diagnosis not present

## 2023-01-10 DIAGNOSIS — F5101 Primary insomnia: Secondary | ICD-10-CM

## 2023-01-10 MED ORDER — HYDROXYZINE PAMOATE 50 MG PO CAPS
50.0000 mg | ORAL_CAPSULE | Freq: Every day | ORAL | 0 refills | Status: DC
  Filled 2023-01-10: qty 30, 30d supply, fill #0

## 2023-01-10 MED ORDER — ONDANSETRON HCL 4 MG PO TABS: 4.0000 mg | ORAL_TABLET | Freq: Every day | ORAL | 1 refills | Status: DC | PRN

## 2023-01-10 NOTE — Patient Instructions (Signed)
Please take Zofran as needed for nausea/vomiting.  Please maintain adequate hydration by taking at least 64 ounces of fluid intake in a day. Please take additional 250 ml for each episode of diarrhea.

## 2023-01-10 NOTE — Progress Notes (Signed)
Virtual Visit via Video Note   Because of [name]'s co-morbid illnesses, she is at least at moderate risk for complications without adequate follow up.  This format is felt to be most appropriate for this patient at this time.  All issues noted in this document were discussed and addressed.  A limited physical exam was performed with this format.      Evaluation Performed:  Follow-up visit  Date:  01/10/2023   ID:  [name], DOB [birth year], MRN [id number]  Patient Location: Home Provider Location: Office/Clinic  Participants: Patient Location of Patient: Home Location of Provider: Telehealth Consent was obtain for visit to be over via telehealth. I verified that I am speaking with the correct person using two identifiers.  PCP:  Lindell Spar, MD   Chief Complaint: Diarrhea  History of Present Illness:    [name] is a 57 y.o. female who has a video visit for c/o diarrhea since last night.  She has had watery BM since last night.  She also reports NBNB vomiting.  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  She currently denies any fever, chills, nasal congestion, postnasal drip, dyspnea or wheezing.  Her COVID test was negative yesterday at nursing home.  She had banana sandwich yesterday, denies any outside food recently.  Denies any recent antibiotic intake.  The patient does not have symptoms concerning for COVID-19 infection (fever, chills, cough, or new shortness of breath).   Past Medical, Surgical, Social History, Allergies, and Medications have been Reviewed.  Past Medical History:  Diagnosis Date   Diabetes mellitus    insulin pump 2013   Dysphagia 03/09/2020   Epidural hematoma ([place]) 12/14/2021   GERD (gastroesophageal reflux disease)    History of kidney stones    Hypercholesteremia    Hypertension    Incarcerated umbilical hernia 12/18/3233   Kidney stone    Kidney stones 08/31/2008    Qualifier: Diagnosis of  By: Jonna Munro MD, Cornelius     Neuropathy    Obesity    Palpitations    Shortness of breath    Vertigo    Past Surgical History:  Procedure Laterality Date   BIOPSY  06/23/2020   Procedure: BIOPSY;  Surgeon: Daneil Dolin, MD;  [facility];  Service: Endoscopy;;   CARDIAC CATHETERIZATION  12/11/2012   normal coronary arteries   CESAREAN SECTION  1994;2000   [place]   COLONOSCOPY WITH PROPOFOL N/A 06/23/2020   large amount of stool in entire colon, prep inadequate.    CYSTOSCOPY W/ URETERAL STENT PLACEMENT  05/08/2012   Procedure: CYSTOSCOPY WITH RETROGRADE PYELOGRAM/URETERAL STENT PLACEMENT;  Surgeon: Marissa Nestle, MD;  Location: [facility];  Service: Urology;  Laterality: Right;   CYSTOSCOPY WITH RETROGRADE PYELOGRAM, URETEROSCOPY AND STENT PLACEMENT Right 05/04/2020   Procedure: CYSTOSCOPY WITH RIGHT RETROGRADE PYELOGRAM, RIGHT URETEROSCOPY AND RIGHT URETERAL STENT EXCHANGE;  Surgeon: Cleon Gustin, MD;  Location: [facility];  Service: Urology;  Laterality: Right;   CYSTOSCOPY WITH RETROGRADE PYELOGRAM, URETEROSCOPY AND STENT PLACEMENT Left 08/08/2020   Procedure: CYSTOSCOPY WITH RETROGRADE PYELOGRAM, URETEROSCOPY WITH LASER  AND STENT PLACEMENT;  Surgeon: Cleon Gustin, MD;  Location: [facility];  Service: Urology;  Laterality: Left;   CYSTOSCOPY WITH RETROGRADE PYELOGRAM, URETEROSCOPY AND STENT PLACEMENT Right 11/08/2020   Procedure: CYSTOSCOPY WITH RIGHT RETROGRADE PYELOGRAM, RIGHT URETEROSCOPY AND STENT PLACEMENT;  Surgeon: Nicolette Bang  L, MD;  Location: [facility];  Service: Urology;  Laterality: Right;   CYSTOSCOPY WITH RETROGRADE PYELOGRAM, URETEROSCOPY AND STENT PLACEMENT Left 12/27/2020   Procedure: CYSTOSCOPY WITH LEFT RETROGRADE PYELOGRAM, LEFT URETEROSCOPY AND LEFT URETERAL STENT PLACEMENT;  Surgeon: Cleon Gustin, MD;  [facility]: [facility];  Service: Urology;  Laterality: Left;   CYSTOSCOPY WITH RETROGRADE  PYELOGRAM, URETEROSCOPY AND STENT PLACEMENT Bilateral 03/16/2021   Procedure: CYSTOSCOPY WITH BILATERAL  RETROGRADE PYELOGRAM, BILATERAL URETEROSCOPY AND STENT PLACEMENT ON THE LEFT;  Surgeon: Cleon Gustin, MD;  [facility]: [facility];  Service: Urology;  Laterality: Bilateral;   CYSTOSCOPY WITH RETROGRADE PYELOGRAM, URETEROSCOPY AND STENT PLACEMENT Left 06/28/2022   Procedure: CYSTOSCOPY WITH RETROGRADE PYELOGRAM, URETEROSCOPY AND STENT PLACEMENT;  Surgeon: Cleon Gustin, MD;  [facility]: [facility];  Service: Urology;  Laterality: Left;   CYSTOSCOPY WITH RETROGRADE PYELOGRAM, URETEROSCOPY AND STENT PLACEMENT Bilateral 09/27/2022   Procedure: CYSTOSCOPY WITH BILATERAL RETROGRADE PYELOGRAM, URETEROSCOPY AND STENT PLACEMENT;  Surgeon: Cleon Gustin, MD;  Location: [facility];  Service: Urology;  Laterality: Bilateral;  pt knows to arrive at 6:00   [place] Right 02/25/2020   Procedure: CYSTOSCOPY WITH RIGHT RETROGRADE RIGHT STENT PLACEMENT;  Surgeon: Festus Aloe, MD;  Location: [facility];  Service: Urology;  Laterality: Right;   ESOPHAGOGASTRODUODENOSCOPY (EGD) WITH PROPOFOL N/A 06/23/2020   Normal esophagus, multiple localized erosions were found in gastric antrum s/p biopsy, normal duodenum. Empiric dilation.Reactive gastropathy with erosions, negative H.pylori, no dysplasia.     EXTRACORPOREAL SHOCK WAVE LITHOTRIPSY Right 10/11/2020   Procedure: EXTRACORPOREAL SHOCK WAVE LITHOTRIPSY (ESWL);  Surgeon: Cleon Gustin, MD;  Location: [facility];  Service: Urology;  Laterality: Right;   FOOT SURGERY Right March, 2013   [facility]-removal of bone spur   HOLMIUM LASER APPLICATION  3/87/5643   Procedure: HOLMIUM LASER LITHOTRIPSY RIGHT URETERAL CALCULUS;  Surgeon: Cleon Gustin, MD;  Location: [facility];  Service: Urology;;   HYSTEROSCOPY WITH THERMACHOICE  04/25/2012   Procedure: HYSTEROSCOPY WITH THERMACHOICE;  Surgeon: Florian Buff, MD;  Location: [facility];  Service:  Gynecology;  Laterality: N/A;  total therapy time= 11 minutes 42 seconds; 34 ml D5w  in and 34 ml D5w out; temp =87 degrees F   LEFT HEART CATHETERIZATION WITH CORONARY ANGIOGRAM N/A 12/11/2012   Procedure: LEFT HEART CATHETERIZATION WITH CORONARY ANGIOGRAM;  Surgeon: Lorretta Harp, MD;  [facility];  Service: Cardiovascular;  Laterality: N/A;   LUMBAR WOUND DEBRIDEMENT N/A 12/14/2021   Procedure: LUMBAR HEMATOMA EVACUATION;  Surgeon: Judith Part, MD;  Location: [place];  Service: Neurosurgery;  Laterality: N/A;   MALONEY DILATION N/A 06/23/2020   Procedure: Venia Minks DILATION;  Surgeon: Daneil Dolin, MD;  [facility];  Service: Endoscopy;  Laterality: N/A;   Sinus sergery  1988   [place]   STONE EXTRACTION WITH BASKET Right 11/08/2020   Procedure: STONE EXTRACTION WITH BASKET;  Surgeon: Cleon Gustin, MD;  Location: [facility];  Service: Urology;  Laterality: Right;   TRANSFORAMINAL LUMBAR INTERBODY FUSION (TLIF) WITH PEDICLE SCREW FIXATION 1 LEVEL N/A 12/05/2021   Procedure: Open Lumbar five-Sacral one Laminectomy/Transforaminal Lumbar Interbody Fusion/Posterolateral instrumented fusion;  Surgeon: Judith Part, MD;  Location: [place];  Service: Neurosurgery;  Laterality: N/A;  3C   UMBILICAL HERNIA REPAIR N/A 03/25/2014   Procedure: UMBILICAL HERNIA REPAIR;  Surgeon: Scherry Ran, MD;  Location: [facility];  Service: General;  Laterality: N/A;   uterine ablation  No outpatient medications have been marked as taking for the 01/10/23 encounter (Video Visit) with Lindell Spar, MD.     Allergies:   Ciprofloxacin, Penicillins, Codeine, Morphine, Sulfonamide derivatives, and Vancomycin   ROS:   Please see the history of present illness.     All other systems reviewed and are negative.   Labs/Other Tests and Data Reviewed:    Recent Labs: 05/24/2022: TSH 1.110 07/02/2022: Hemoglobin 14.2; Platelets 302 11/21/2022: ALT 15; BUN 13; Creatinine, Ser  0.79; Potassium 3.9; Sodium 137   Recent Lipid Panel Lab Results  Component Value Date/Time   CHOL 156 11/21/2022 03:12 PM   TRIG 185 (H) 11/21/2022 03:12 PM   HDL 53 11/21/2022 03:12 PM   CHOLHDL 2.9 11/21/2022 03:12 PM   CHOLHDL 4.0 07/20/2013 11:00 AM   LDLCALC 72 11/21/2022 03:12 PM    Wt Readings from Last 3 Encounters:  01/01/23 177 lb 1.9 oz (80.3 kg)  12/25/22 176 lb (79.8 kg)  11/27/22 176 lb 6.4 oz (80 kg)     Objective:    Vital Signs:  Temp 99 F (37.2 C) (Oral)   LMP 07/16/2012    VITAL SIGNS:  reviewed GEN:  no acute distress EYES:  sclerae anicteric, EOMI - Extraocular Movements Intact RESPIRATORY:  normal respiratory effort, symmetric expansion NEURO:  alert and oriented x 3, no obvious focal deficit PSYCH:  normal affect  ASSESSMENT & PLAN:    Acute gastroenteritis Likely viral Advised to maintain adequate hydration Zofran as needed for nausea or vomiting Avoid OTC antidiarrheal medicine such as Imodium If persistent, will get stool GI profile  I discussed the assessment and treatment plan with the patient. The patient was provided an opportunity to ask questions, and all were answered. The patient agreed with the plan and demonstrated an understanding of the instructions.   The patient was advised to call back or seek an in-person evaluation if the symptoms worsen or if the condition fails to improve as anticipated.  The above assessment and management plan was discussed with the patient. The patient verbalized understanding of and has agreed to the management plan.   Medication Adjustments/Labs and Tests Ordered: Current medicines are reviewed at length with the patient today.  Concerns regarding medicines are outlined above.   Tests Ordered: No orders of the defined types were placed in this encounter.   Medication Changes: No orders of the defined types were placed in this encounter.    Note: This dictation was prepared with Dragon  dictation along with smaller phrase technology. Similar sounding words can be transcribed inadequately or may not be corrected upon review. Any transcriptional errors that result from this process are unintentional.      Disposition:  Follow up  Signed, Lindell Spar, MD  01/10/2023 11:54 AM     [place]

## 2023-01-11 ENCOUNTER — Other Ambulatory Visit: Payer: Self-pay | Admitting: Internal Medicine

## 2023-01-11 DIAGNOSIS — F5101 Primary insomnia: Secondary | ICD-10-CM

## 2023-01-21 ENCOUNTER — Ambulatory Visit (INDEPENDENT_AMBULATORY_CARE_PROVIDER_SITE_OTHER): Payer: 59 | Admitting: Internal Medicine

## 2023-01-21 ENCOUNTER — Encounter: Payer: Self-pay | Admitting: Internal Medicine

## 2023-01-21 VITALS — BP 108/69 | HR 78 | Ht <= 58 in | Wt 175.1 lb

## 2023-01-21 DIAGNOSIS — R2231 Localized swelling, mass and lump, right upper limb: Secondary | ICD-10-CM | POA: Diagnosis not present

## 2023-01-21 DIAGNOSIS — L989 Disorder of the skin and subcutaneous tissue, unspecified: Secondary | ICD-10-CM

## 2023-01-21 MED ORDER — DOXYCYCLINE HYCLATE 100 MG PO TABS: 100.0000 mg | ORAL_TABLET | Freq: Two times a day (BID) | ORAL | 0 refills | Status: DC

## 2023-01-21 MED ORDER — FLUCONAZOLE 150 MG PO TABS: 150.0000 mg | ORAL_TABLET | Freq: Once | ORAL | 0 refills | Status: AC

## 2023-01-21 NOTE — Patient Instructions (Signed)
Thank you for trusting me with your care. To recap, today we discussed the following:    - doxycycline (VIBRA-TABS) 100 MG tablet; Take 1 tablet (100 mg total) by mouth 2 (two) times daily. 1 po bid  Dispense: 14 tablet; Refill: 0 - fluconazole (DIFLUCAN) 150 MG tablet; Take 1 tablet (150 mg total) by mouth once for 1 dose.  Dispense: 1 tablet; Refill: 0

## 2023-01-21 NOTE — Progress Notes (Unsigned)
   HPI:Ms.DELMARIE TRABER is a 57 y.o. female who presents for evaluation of swollen finger (Right pinky finger. Not any better from before) . For the details of today's visit, please refer to the assessment and plan.  Physical Exam: Vitals:   01/21/23 1544  BP: 108/69  Pulse: 78  SpO2: 94%  Weight: 175 lb 1.9 oz (79.4 kg)  Height: 4\' 8"  (1.422 m)     Physical Exam Constitutional:      General: She is not in acute distress.    Appearance: She is not ill-appearing.  Skin:    Comments: Painful papule with erythematous base with multiple pinpoint ulcerations. No drainage.         Assessment & Plan:   Nodule of finger of right hand Painful papule on right pinky finger.  This has been present for about 2 weeks.  She does not remember doing anything to her finger to cause the sore. Treated as gout with prednisone and has not improved. She has no history of HSV infection. She is T2DM. Evaluated with ultrasound and no drainable collection of fluid.  Plan: I question if this is herpetic whitlow. No specific treatment for this condition and should improve on its own. Will prescribe antibiotic for possibility of skin infection. Allergy to penicillin noted. Follow up if not improving or worsening. Reports yeast infections when taking antibiotics, will prescribe diflucan to take if she develops vulvovaginal candidiasis. - doxycycline (VIBRA-TABS) 100 MG tablet; Take 1 tablet (100 mg total) by mouth 2 (two) times daily. 1 po bid  Dispense: 14 tablet; Refill: 0 - fluconazole (DIFLUCAN) 150 MG tablet; Take 1 tablet (150 mg total) by mouth once for 1 dose.  Dispense: 1 tablet; Refill: 0     Milus Banister, MD

## 2023-01-22 DIAGNOSIS — R2231 Localized swelling, mass and lump, right upper limb: Secondary | ICD-10-CM | POA: Insufficient documentation

## 2023-01-22 NOTE — Assessment & Plan Note (Addendum)
Painful papule on right pinky finger.  This has been present for about 2 weeks.  She does not remember doing anything to her finger to cause the sore. Treated as gout with prednisone and has not improved. She has no history of HSV infection. She is T2DM. Evaluated with ultrasound and no drainable collection of fluid.  Plan: I question if this is herpetic whitlow. No specific treatment for this condition and should improve on its own. Will prescribe antibiotic for possibility of skin infection. Allergy to penicillin noted. Follow up if not improving or worsening. Reports yeast infections when taking antibiotics, will prescribe diflucan to take if she develops vulvovaginal candidiasis. - doxycycline (VIBRA-TABS) 100 MG tablet; Take 1 tablet (100 mg total) by mouth 2 (two) times daily. 1 po bid  Dispense: 14 tablet; Refill: 0 - fluconazole (DIFLUCAN) 150 MG tablet; Take 1 tablet (150 mg total) by mouth once for 1 dose.  Dispense: 1 tablet; Refill: 0

## 2023-01-29 ENCOUNTER — Other Ambulatory Visit (HOSPITAL_COMMUNITY): Payer: Self-pay

## 2023-01-29 ENCOUNTER — Other Ambulatory Visit: Payer: Self-pay | Admitting: Internal Medicine

## 2023-01-29 ENCOUNTER — Ambulatory Visit: Payer: 59 | Admitting: Internal Medicine

## 2023-01-29 ENCOUNTER — Other Ambulatory Visit: Payer: Self-pay

## 2023-01-29 DIAGNOSIS — F5101 Primary insomnia: Secondary | ICD-10-CM

## 2023-01-29 MED ORDER — HYDROXYZINE PAMOATE 50 MG PO CAPS
50.0000 mg | ORAL_CAPSULE | Freq: Every day | ORAL | 0 refills | Status: DC
  Filled 2023-01-29 – 2023-02-03 (×2): qty 30, 30d supply, fill #0

## 2023-01-29 MED ORDER — LISINOPRIL-HYDROCHLOROTHIAZIDE 20-12.5 MG PO TABS: 1.0000 | ORAL_TABLET | Freq: Every day | ORAL | 3 refills | Status: DC

## 2023-01-29 MED ORDER — SIMVASTATIN 40 MG PO TABS
40.0000 mg | ORAL_TABLET | Freq: Every evening | ORAL | 3 refills | Status: DC
  Filled 2023-01-29: qty 90, 90d supply, fill #0
  Filled 2023-05-02: qty 90, 90d supply, fill #1
  Filled 2023-05-22 – 2023-07-26 (×4): qty 90, 90d supply, fill #2
  Filled 2023-10-24: qty 90, 90d supply, fill #3

## 2023-01-30 ENCOUNTER — Encounter: Payer: Self-pay | Admitting: Internal Medicine

## 2023-01-30 ENCOUNTER — Telehealth: Payer: Self-pay

## 2023-01-30 ENCOUNTER — Ambulatory Visit (INDEPENDENT_AMBULATORY_CARE_PROVIDER_SITE_OTHER): Payer: 59 | Admitting: Internal Medicine

## 2023-01-30 ENCOUNTER — Other Ambulatory Visit: Payer: Self-pay

## 2023-01-30 VITALS — BP 135/80 | HR 87 | Ht <= 58 in | Wt 174.8 lb

## 2023-01-30 DIAGNOSIS — N200 Calculus of kidney: Secondary | ICD-10-CM

## 2023-01-30 DIAGNOSIS — I10 Essential (primary) hypertension: Secondary | ICD-10-CM

## 2023-01-30 DIAGNOSIS — M4802 Spinal stenosis, cervical region: Secondary | ICD-10-CM

## 2023-01-30 DIAGNOSIS — M48062 Spinal stenosis, lumbar region with neurogenic claudication: Secondary | ICD-10-CM

## 2023-01-30 DIAGNOSIS — F40240 Claustrophobia: Secondary | ICD-10-CM | POA: Diagnosis not present

## 2023-01-30 DIAGNOSIS — R109 Unspecified abdominal pain: Secondary | ICD-10-CM

## 2023-01-30 MED ORDER — OXYCODONE-ACETAMINOPHEN 5-325 MG PO TABS: 1.0000 | ORAL_TABLET | Freq: Three times a day (TID) | ORAL | 0 refills | Status: DC | PRN

## 2023-01-30 MED ORDER — LORAZEPAM 1 MG PO TABS: ORAL_TABLET | ORAL | 0 refills | Status: DC

## 2023-01-30 NOTE — Assessment & Plan Note (Signed)
BP Readings from Last 1 Encounters:  01/30/23 135/80   Well-controlled with Lisinopril-HCTZ 20-12.5 mg QD Counseled for compliance with the medications Advised DASH diet and moderate exercise/walking as tolerated

## 2023-01-30 NOTE — Patient Instructions (Signed)
Please take Percocet as needed for severe neck pain.  Please continue Gabapentin for neuropathic pain for now.  Please take Ativan as prescribed for anxiety 1 hour before the procedure.  Please continue to take other medications as prescribed.  Please continue to follow low carb diet and perform moderate exercise/walking at least 150 mins/week.

## 2023-01-30 NOTE — Progress Notes (Signed)
Acute Office Visit  Subjective:    Patient ID: [name], female    DOB: [birth year], 57 y.o.   MRN: [id number]  Chief Complaint  Patient presents with   Neck Pain    Patient is having neck pain that radiates down right arm and makes fingers go numb. She is also having back pain that radiates down her left leg. Discuss blood pressure medication    HPI Patient is in today for complaint of acute on chronic neck pain, worse for the last 2 weeks, sharp, radiating to the RUE, associated with numbness and tingling of the RUE, worse with forward flexion and right-sided movement of the neck.  She is currently on gabapentin for neuropathic pain.  She also reports chronic low back pain, and has history of DDD of lumbar spine.  She has had L5-S1 laminectomy, transforaminal lumbar interbody fusion and posterolateral instrumented fusion in 12/22.  HTN: Her BP is wnl. She was placed on Lisinopril-HCTZ 20-12.5 mg for it, but her Endocrinologist's office sent old dose of the medicine (10-12.5 mg). I have explained that she needs to be on 20-12.5 mg dose and she agrees.  She denies any headache, dizziness, chest pain, dyspnea or palpitations currently.  Past Medical History:  Diagnosis Date   Diabetes mellitus    insulin pump 2013   Dysphagia 03/09/2020   Epidural hematoma ([place]) 12/14/2021   GERD (gastroesophageal reflux disease)    History of kidney stones    Hypercholesteremia    Hypertension    Incarcerated umbilical hernia [id number]   Kidney stone    Kidney stones 08/31/2008   Qualifier: Diagnosis of  By: Jonna Munro MD, Cornelius     Neuropathy    Obesity    Palpitations    Shortness of breath    Vertigo     Past Surgical History:  Procedure Laterality Date   BIOPSY  06/23/2020   Procedure: BIOPSY;  Surgeon: Daneil Dolin, MD;  [facility];  Service: Endoscopy;;   CARDIAC CATHETERIZATION  12/11/2012   normal coronary arteries   CESAREAN SECTION  1994;2000   [place]   COLONOSCOPY WITH PROPOFOL N/A 06/23/2020   large amount of stool in entire colon, prep inadequate.    CYSTOSCOPY W/ URETERAL STENT PLACEMENT  05/08/2012   Procedure: CYSTOSCOPY WITH RETROGRADE PYELOGRAM/URETERAL STENT PLACEMENT;  Surgeon: Marissa Nestle, MD;  Location: [facility];  Service: Urology;  Laterality: Right;   CYSTOSCOPY WITH RETROGRADE PYELOGRAM, URETEROSCOPY AND STENT PLACEMENT Right 05/04/2020   Procedure: CYSTOSCOPY WITH RIGHT RETROGRADE PYELOGRAM, RIGHT URETEROSCOPY AND RIGHT URETERAL STENT EXCHANGE;  Surgeon: Cleon Gustin, MD;  Location: [facility];  Service: Urology;  Laterality: Right;   CYSTOSCOPY WITH RETROGRADE PYELOGRAM, URETEROSCOPY AND STENT PLACEMENT Left 08/08/2020   Procedure: CYSTOSCOPY WITH RETROGRADE PYELOGRAM, URETEROSCOPY WITH LASER  AND STENT PLACEMENT;  Surgeon: Cleon Gustin, MD;  Location: [facility];  Service: Urology;  Laterality: Left;   CYSTOSCOPY WITH RETROGRADE PYELOGRAM, URETEROSCOPY AND STENT PLACEMENT Right 11/08/2020   Procedure: CYSTOSCOPY WITH RIGHT RETROGRADE PYELOGRAM, RIGHT URETEROSCOPY AND STENT PLACEMENT;  Surgeon: Cleon Gustin, MD;  Location: [facility];  Service: Urology;  Laterality: Right;   CYSTOSCOPY WITH RETROGRADE PYELOGRAM, URETEROSCOPY AND STENT PLACEMENT Left 12/27/2020   Procedure: CYSTOSCOPY WITH LEFT RETROGRADE PYELOGRAM, LEFT URETEROSCOPY AND LEFT URETERAL STENT PLACEMENT;  Surgeon: Cleon Gustin, MD;  Location[facility];  Service: Urology;  Laterality: Left;   CYSTOSCOPY WITH RETROGRADE PYELOGRAM, URETEROSCOPY AND STENT  PLACEMENT Bilateral 03/16/2021   Procedure: CYSTOSCOPY WITH BILATERAL  RETROGRADE PYELOGRAM, BILATERAL URETEROSCOPY AND STENT PLACEMENT ON THE LEFT;  Surgeon: Cleon Gustin, MD;  Location: [facility];  Service: Urology;  Laterality: Bilateral;   CYSTOSCOPY WITH RETROGRADE PYELOGRAM, URETEROSCOPY AND STENT PLACEMENT Left 06/28/2022   Procedure: CYSTOSCOPY WITH RETROGRADE PYELOGRAM,  URETEROSCOPY AND STENT PLACEMENT;  Surgeon: Cleon Gustin, MD;  [facility];  Service: Urology;  Laterality: Left;   CYSTOSCOPY WITH RETROGRADE PYELOGRAM, URETEROSCOPY AND STENT PLACEMENT Bilateral 09/27/2022   Procedure: CYSTOSCOPY WITH BILATERAL RETROGRADE PYELOGRAM, URETEROSCOPY AND STENT PLACEMENT;  Surgeon: Cleon Gustin, MD;  Location: [facility];  Service: Urology;  Laterality: Bilateral;  pt knows to arrive at 6:00   [place] [facility] [place] Right 02/25/2020   Procedure: CYSTOSCOPY WITH RIGHT RETROGRADE RIGHT STENT PLACEMENT;  Surgeon: Festus Aloe, MD;  Location: [facility];  Service: Urology;  Laterality: Right;   ESOPHAGOGASTRODUODENOSCOPY (EGD) WITH PROPOFOL N/A 06/23/2020   Normal esophagus, multiple localized erosions were found in gastric antrum s/p biopsy, normal duodenum. Empiric dilation.Reactive gastropathy with erosions, negative H.pylori, no dysplasia.     EXTRACORPOREAL SHOCK WAVE LITHOTRIPSY Right 10/11/2020   Procedure: EXTRACORPOREAL SHOCK WAVE LITHOTRIPSY (ESWL);  Surgeon: Cleon Gustin, MD;  Location: [facility];  Service: Urology;  Laterality: Right;   FOOT SURGERY Right March, 2013   [facility]-removal of bone spur   HOLMIUM LASER APPLICATION  123456   Procedure: HOLMIUM LASER LITHOTRIPSY RIGHT URETERAL CALCULUS;  Surgeon: Cleon Gustin, MD;  Location: [facility];  Service: Urology;;   HYSTEROSCOPY WITH THERMACHOICE  04/25/2012   Procedure: HYSTEROSCOPY WITH THERMACHOICE;  Surgeon: Florian Buff, MD;  Location: [facility];  Service: Gynecology;  Laterality: N/A;  total therapy time= 11 minutes 42 seconds; 34 ml D5w  in and 34 ml D5w out; temp =87 degrees F   LEFT HEART CATHETERIZATION WITH CORONARY ANGIOGRAM N/A 12/11/2012   Procedure: LEFT HEART CATHETERIZATION WITH CORONARY ANGIOGRAM;  Surgeon: Lorretta Harp, MD;  Location: [facility] C[facility];  Service: Cardiovascular;  Laterality: N/A;   LUMBAR WOUND DEBRIDEMENT N/A 12/14/2021   Procedure: LUMBAR  HEMATOMA EVACUATION;  Surgeon: Judith Part, MD;  Location: [place];  Service: Neurosurgery;  Laterality: N/A;   MALONEY DILATION N/A 06/23/2020   Procedure: Venia Minks DILATION;  Surgeon: Daneil Dolin, MD;  [facility];  Service: Endoscopy;  Laterality: N/A;   Sinus sergery  1988   [place]   STONE EXTRACTION WITH BASKET Right 11/08/2020   Procedure: STONE EXTRACTION WITH BASKET;  Surgeon: Cleon Gustin, MD;  Location: [facility];  Service: Urology;  Laterality: Right;   TRANSFORAMINAL LUMBAR INTERBODY FUSION (TLIF) WITH PEDICLE SCREW FIXATION 1 LEVEL N/A 12/05/2021   Procedure: Open Lumbar five-Sacral one Laminectomy/Transforaminal Lumbar Interbody Fusion/Posterolateral instrumented fusion;  Surgeon: Judith Part, MD;  Location: [place];  Service: Neurosurgery;  Laterality: N/A;  3C   UMBILICAL HERNIA REPAIR N/A 03/25/2014   Procedure: UMBILICAL HERNIA REPAIR;  Surgeon: Scherry Ran, MD;  Location: [facility];  Service: General;  Laterality: N/A;   uterine ablation      Family History  Problem Relation Age of Onset   Hypertension Mother    Cancer Mother    Diabetes Mother    Stroke Mother    Diabetes Father    Depression Paternal Grandmother    Depression Paternal Grandfather    Cancer Paternal Uncle    Heart disease Other    Cancer Other    Diabetes  Other    Achalasia Other    Anesthesia problems Neg Hx    Hypotension Neg Hx    Malignant hyperthermia Neg Hx    Pseudochol deficiency Neg Hx    Colon cancer Neg Hx    Colon polyps Neg Hx     Social History   Socioeconomic History   Marital status: Married    Spouse name: Not on file   Number of children: 2   Years of education: college   Highest education level: Not on file  Occupational History   Occupation: CNA    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: [employer]   Occupation: Quarry manager- at [employer]  Tobacco Use   Smoking status: Never   Smokeless tobacco: Never  Vaping Use   Vaping  Use: Never used  Substance and Sexual Activity   Alcohol use: No   Drug use: No   Sexual activity: Not Currently    Partners: Male    Birth control/protection: Post-menopausal    Comment: ablation  Other Topics Concern   Not on file  Social History Narrative   Regular exercise: walks Caffeine use:    Social Determinants of Health   Financial Resource Strain: Low Risk  (01/11/2022)   Overall Financial Resource Strain (CARDIA)    Difficulty of Paying Living Expenses: Not hard at all  Food Insecurity: Food Insecurity Present (01/11/2022)   Hunger Vital Sign    Worried About [place] in the Last Year: Sometimes true    Ran Out of Food in the Last Year: Never true  Transportation Needs: No Transportation Needs (01/11/2022)   PRAPARE - Hydrologist (Medical): No    Lack of Transportation (Non-Medical): No  Physical Activity: Insufficiently Active (01/11/2022)   Exercise Vital Sign    Days of Exercise per Week: 2 days    Minutes of Exercise per Session: 20 min  Stress: Stress Concern Present (01/11/2022)   [employer]    Feeling of Stress : To some extent  Social Connections: Moderately Integrated (01/11/2022)   Social Connection and Isolation Panel [NHANES]    Frequency of Communication with Friends and Family: Three times a week    Frequency of Social Gatherings with Friends and Family: Twice a week    Attends Religious Services: 1 to 4 times per year    Active Member of [employer] or Organizations: No    Attends Archivist Meetings: Never    Marital Status: Married  Human resources officer Violence: Not At Risk (01/11/2022)   Humiliation, Afraid, Rape, and Kick questionnaire    Fear of Current or Ex-Partner: No    Emotionally Abused: No    Physically Abused: No    Sexually Abused: No    Outpatient Medications Prior to Visit  Medication Sig Dispense Refill   albuterol (VENTOLIN HFA)  108 (90 Base) MCG/ACT inhaler Inhale 2 puffs into the lungs every 6 (six) hours as needed for wheezing or shortness of breath.      blood glucose meter kit and supplies Use up to four times daily as directed. 1 each 0   clotrimazole (ANTIFUNGAL CLOTRIMAZOLE) 1 % cream Apply 1 Application topically 2 (two) times daily. 30 g 0   cyclobenzaprine (FLEXERIL) 10 MG tablet Take 1 tablet (10 mg total) by mouth 3 (three) times daily as needed for muscle spasms. 30 tablet 0   Daridorexant HCl (QUVIVIQ) 50 MG TABS Take 1  tablet by mouth at bedtime. 30 tablet 3   Dulaglutide (TRULICITY) 3 0000000 SOPN Inject 3 mg into the skin once a week. 6 mL 1   Empagliflozin-metFORMIN HCl (SYNJARDY) 12.5-500 MG TABS Take 1 tablet by mouth daily. 90 tablet 0   furosemide (LASIX) 20 MG tablet Take 20 mg by mouth 2 (two) times daily as needed for edema.     gabapentin (NEURONTIN) 600 MG tablet Take 1 tablet (600 mg total) by mouth 3 (three) times daily. 90 tablet 2   glucose blood test strip Test up to 4 times a day 100 each 0   hydrOXYzine (VISTARIL) 50 MG capsule Take 1 capsule (50 mg total) by mouth daily. 30 capsule 0   indapamide (LOZOL) 2.5 MG tablet Take 1 tablet (2.5 mg total) by mouth daily. 30 tablet 11   Lancets (FREESTYLE) lancets Use to test 4 times daily 100 each 0   lisinopril-hydrochlorothiazide (ZESTORETIC) 20-12.5 MG tablet Take 1 tablet by mouth daily. 90 tablet 3   meclizine (ANTIVERT) 25 MG tablet TAKE 1 TABLET(25 MG) BY MOUTH THREE TIMES DAILY AS NEEDED FOR DIZZINESS OR NAUSEA 30 tablet 0   omeprazole (PRILOSEC) 20 MG capsule Take 1 capsule (20 mg total) by mouth daily. 90 capsule 1   ondansetron (ZOFRAN) 4 MG tablet Take 1 tablet (4 mg total) by mouth daily as needed for nausea or vomiting. 30 tablet 1   Potassium Citrate (UROCIT-K 15) 15 MEQ (1620 MG) TBCR Take 1 tablet by mouth in the morning and at bedtime. 60 tablet 11   promethazine (PHENERGAN) 25 MG tablet TAKE 1 TABLET(25 MG) BY MOUTH EVERY 8  HOURS AS NEEDED FOR NAUSEA OR VOMITING 20 tablet 0   simvastatin (ZOCOR) 40 MG tablet Take 1 tablet (40 mg total) by mouth every evening for cholesterol 90 tablet 3   doxycycline (VIBRA-TABS) 100 MG tablet Take 1 tablet (100 mg total) by mouth 2 (two) times daily. 1 po bid 14 tablet 0   fluconazole (DIFLUCAN) 150 MG tablet Take 1 tablet (150 mg total) by mouth every 3 (three) days. 2 tablet 0   lisinopril-hydrochlorothiazide (ZESTORETIC) 10-12.5 MG tablet Take 1 tablet by mouth daily. 90 tablet 1   oxyCODONE (OXY IR/ROXICODONE) 5 MG immediate release tablet Take 1 tablet (5 mg total) by mouth every 4 (four) hours as needed (pain). 30 tablet 0   No facility-administered medications prior to visit.    Allergies  Allergen Reactions   Ciprofloxacin Hives and Swelling   Penicillins Anaphylaxis and Rash   Codeine Other (See Comments)    had hallicunations   Morphine Nausea And Vomiting and Other (See Comments)    recieved in ED due to [facility] and had multple doses - gave visual hallucinations and vomitting.   Sulfonamide Derivatives Other (See Comments)    REACTION: Unsure - childhood allergy   Vancomycin Itching    Review of Systems  Constitutional:  Negative for chills and fever.  HENT:  Negative for congestion, sinus pressure, sinus pain and sore throat.   Eyes:  Negative for pain and discharge.  Respiratory:  Negative for cough and shortness of breath.   Cardiovascular:  Negative for chest pain and palpitations.  Gastrointestinal:  Negative for abdominal pain, diarrhea, nausea and vomiting.  Endocrine: Negative for polydipsia and polyuria.  Genitourinary:  Negative for dysuria and hematuria.       Perineal itching  Musculoskeletal:  Positive for back pain and neck pain. Negative for neck stiffness.  Skin:  Negative for  rash.  Neurological:  Positive for numbness (Right hand). Negative for dizziness and weakness.  Psychiatric/Behavioral:  Positive for sleep disturbance. Negative  for agitation and behavioral problems.        Objective:    Physical Exam Vitals reviewed.  Constitutional:      General: She is not in acute distress.    Appearance: She is obese. She is not diaphoretic.  HENT:     Head: Normocephalic and atraumatic.     Mouth/Throat:     Mouth: Mucous membranes are moist.     Pharynx: No posterior oropharyngeal erythema.  Eyes:     General: No scleral icterus.    Extraocular Movements: Extraocular movements intact.  Cardiovascular:     Rate and Rhythm: Normal rate and regular rhythm.     Pulses: Normal pulses.     Heart sounds: Normal heart sounds. No murmur heard. Pulmonary:     Breath sounds: Normal breath sounds. No wheezing or rales.  Musculoskeletal:     Cervical back: Neck supple. Tenderness present. Decreased range of motion.     Right lower leg: No edema.     Left lower leg: No edema.  Skin:    General: Skin is warm.     Findings: No rash.  Neurological:     General: No focal deficit present.     Mental Status: She is alert and oriented to person, place, and time.  Psychiatric:        Mood and Affect: Mood normal.        Behavior: Behavior normal.     BP 135/80 (BP Location: Right Arm, Patient Position: Sitting, Cuff Size: Normal)   Pulse 87   Ht 4' 8"$  (1.422 m)   Wt 174 lb 12.8 oz (79.3 kg)   LMP 07/16/2012   SpO2 96%   BMI 39.19 kg/m  Wt Readings from Last 3 Encounters:  01/30/23 174 lb 12.8 oz (79.3 kg)  01/21/23 175 lb 1.9 oz (79.4 kg)  01/01/23 177 lb 1.9 oz (80.3 kg)        Assessment & Plan:   Problem List Items Addressed This Visit       Cardiovascular and Mediastinum   Essential hypertension, benign    BP Readings from Last 1 Encounters:  01/30/23 135/80  Well-controlled with Lisinopril-HCTZ 20-12.5 mg QD Counseled for compliance with the medications Advised DASH diet and moderate exercise/walking as tolerated        Other   Spinal stenosis of lumbar region with neurogenic claudication     S/p L5-S1 lumbar body fusion, complicated by epidural hematoma Has chronic low back pain Continue Gabapentin Short supply of Percocet PRN for severe pain Avoid heavy lifting and frequent bending      Relevant Medications   oxyCODONE-acetaminophen (PERCOCET/ROXICET) 5-325 MG tablet   Other Relevant Orders   Ambulatory referral to Spine Surgery   Cervical spinal stenosis - Primary    Previous MRI of cervical spine (2014) reviewed - showed cervical spinal stenosis and neural foraminal narrowing Currently has radicular symptoms in RUE with limited ROM Check MRI of cervical spine to look for progression Percocet as needed for severe pain Continue gabapentin for neuropathic pain Referred to spine surgery      Relevant Orders   MR Cervical Spine Wo Contrast   Other Visit Diagnoses     Claustrophobia       Relevant Medications   LORazepam (ATIVAN) 1 MG tablet        Meds ordered this encounter  Medications   LORazepam (ATIVAN) 1 MG tablet    Sig: Take 1 tablet 1 hour before procedure. Repeat dose after 30 mins if you have persistent anxiety.    Dispense:  2 tablet    Refill:  0   oxyCODONE-acetaminophen (PERCOCET/ROXICET) 5-325 MG tablet    Sig: Take 1 tablet by mouth every 8 (eight) hours as needed for severe pain.    Dispense:  30 tablet    Refill:  0     Kendalyn Cranfield Keith Rake, MD

## 2023-01-30 NOTE — Telephone Encounter (Signed)
Patient needing to discuss: Continued left side pain to back flank Out of pills for spasams - they didn't help with pain when taking them  Please advise.  Call back:  [phone]

## 2023-01-30 NOTE — Assessment & Plan Note (Signed)
S/p L5-S1 lumbar body fusion, complicated by epidural hematoma Has chronic low back pain Continue Gabapentin Short supply of Percocet PRN for severe pain Avoid heavy lifting and frequent bending

## 2023-01-30 NOTE — Assessment & Plan Note (Signed)
Previous MRI of cervical spine (2014) reviewed - showed cervical spinal stenosis and neural foraminal narrowing Currently has radicular symptoms in RUE with limited ROM Check MRI of cervical spine to look for progression Percocet as needed for severe pain Continue gabapentin for neuropathic pain Referred to spine surgery

## 2023-01-31 ENCOUNTER — Ambulatory Visit (INDEPENDENT_AMBULATORY_CARE_PROVIDER_SITE_OTHER): Payer: 59 | Admitting: Adult Health

## 2023-01-31 ENCOUNTER — Encounter: Payer: Self-pay | Admitting: Adult Health

## 2023-01-31 VITALS — BP 108/70 | HR 76 | Ht <= 58 in | Wt 175.0 lb

## 2023-01-31 DIAGNOSIS — Z01419 Encounter for gynecological examination (general) (routine) without abnormal findings: Secondary | ICD-10-CM | POA: Diagnosis not present

## 2023-01-31 DIAGNOSIS — Z1211 Encounter for screening for malignant neoplasm of colon: Secondary | ICD-10-CM

## 2023-01-31 DIAGNOSIS — Z780 Asymptomatic menopausal state: Secondary | ICD-10-CM

## 2023-01-31 LAB — HEMOCCULT GUIAC POC 1CARD (OFFICE): Fecal Occult Blood, POC: NEGATIVE

## 2023-01-31 NOTE — Progress Notes (Signed)
Patient ID: [name], female   DOB: January 25, 1966, 57 y.o.   MRN: [id number] History of Present Illness: [name] is a 57 year old white female,married, PM in for a well woman gyn exam. Mom died last year.  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 She has lost weight since back surgery last year and mom's death.   Last pap was ASCUS negative HPV 01/11/22  PCP is Dr Posey Pronto   Current Medications, Allergies, Past Medical History, Past Surgical History, Family History and Social History were reviewed in [facility] record.     Review of Systems: Patient denies any headaches, hearing loss, fatigue, blurred vision, shortness of breath, chest pain, abdominal pain, problems with bowel movements, urination, or intercourse. No joint pain or mood swings.     Physical Exam:BP 108/70 (BP Location: Left Arm, Patient Position: Sitting, Cuff Size: Normal)   Pulse 76   Ht 4' 8"$  (1.422 m)   Wt 175 lb (79.4 kg)   LMP 07/16/2012   BMI 39.23 kg/m   General:  Well developed, well nourished, no acute distress Skin:  Warm and dry Neck:  Midline trachea, normal thyroid, good ROM, no lymphadenopathy Lungs; Clear to auscultation bilaterally Breast:  No dominant palpable mass, retraction, or nipple discharge Cardiovascular: Regular rate and rhythm Abdomen:  Soft, non tender, no hepatosplenomegaly Pelvic:  External genitalia is normal in appearance, no lesions.  The vagina is pale. Urethra has no lesions or masses. The cervix is smooth.  Uterus is felt to be normal size, shape, and contour.  No adnexal masses or tenderness noted.Bladder is non tender, no masses felt. Rectal: Good sphincter tone, no polyps, or hemorrhoids felt.  Hemoccult negative. Extremities/musculoskeletal:  No swelling or varicosities noted, no clubbing or cyanosis Psych:  No mood changes, alert and cooperative,seems happy AA is 0 Fall risk is low    01/31/2023    3:32 PM 01/30/2023    2:32 PM 01/21/2023    3:45 PM  Depression  screen PHQ 2/9  Decreased Interest 1 2 2  $ Down, Depressed, Hopeless 1 2 2  $ PHQ - 2 Score 2 4 4  $ Altered sleeping 1 2 2  $ Tired, decreased energy 1 2 2  $ Change in appetite 1 2 1  $ Feeling bad or failure about yourself  0 0 0  Trouble concentrating 0 1 0  Moving slowly or fidgety/restless 0 0 0  Suicidal thoughts 0 0 0  PHQ-9 Score 5 11 9  $ Difficult doing work/chores  Somewhat difficult    Has ativan and vistaril     01/31/2023    3:32 PM 01/30/2023    2:32 PM 01/21/2023    3:45 PM 01/01/2023    3:34 PM  GAD 7 : Generalized Anxiety Score  Nervous, Anxious, on Edge 1 1 0 3  Control/stop worrying 1 2 0 3  Worry too much - different things 1 2 0 3  Trouble relaxing 1 2 1 2  $ Restless 1 1 0 2  Easily annoyed or irritable 1 1 0 2  Afraid - awful might happen 0 0 0 2  Total GAD 7 Score 6 9 1 17  $ Anxiety Difficulty  Not difficult at all  Not difficult at all      Upstream - 01/31/23 1529       Pregnancy Intention Screening   Does the patient want to become pregnant in the next year? N/A    Does the patient's partner want to become pregnant in the next  year? N/A    Would the patient like to discuss contraceptive options today? N/A      Contraception Wrap Up   Current Method No Method - Other Reason   postmenopausal   Reason for No Current Contraceptive Method at Intake ([facility] Only) Other    End Method No Method - Other Reason   postmenopausal   Contraception Counseling Provided No            Examination chaperoned by Levy Pupa LPN  Impression and Plan: 1. Encounter for well woman exam with routine gynecological exam Physical in 1 year Pap in 206 Mammogram was negative 05/14/22 Colonoscopy per [facility] with PCP  2. Encounter for screening fecal occult blood testing Hemoccult was negative   3. Postmenopausal

## 2023-02-01 ENCOUNTER — Telehealth: Payer: Self-pay | Admitting: Internal Medicine

## 2023-02-01 NOTE — Telephone Encounter (Signed)
Patient called in regard to MRI orders /. Appointment.  Patient wants to have MRI done in [place].  Patient wants a call back before  its scheduled so that she can give availability.

## 2023-02-04 ENCOUNTER — Other Ambulatory Visit: Payer: Self-pay

## 2023-02-05 NOTE — Telephone Encounter (Signed)
Patient states that she is still having pain on her left side traveling down to her back. Patient states she was previously on medication for muscle spasm. Made patient aware that I will send a message to the MD and someone will follow back up. Patient voiced understanding

## 2023-02-06 DIAGNOSIS — Z794 Long term (current) use of insulin: Secondary | ICD-10-CM | POA: Diagnosis not present

## 2023-02-06 DIAGNOSIS — E1169 Type 2 diabetes mellitus with other specified complication: Secondary | ICD-10-CM | POA: Diagnosis not present

## 2023-02-07 LAB — T4, FREE: Free T4: 0.87 ng/dL (ref 0.82–1.77)

## 2023-02-07 LAB — COMPREHENSIVE METABOLIC PANEL
ALT: 14 IU/L (ref 0–32)
AST: 17 IU/L (ref 0–40)
Albumin/Globulin Ratio: 1.6 (ref 1.2–2.2)
Albumin: 3.9 g/dL (ref 3.8–4.9)
Alkaline Phosphatase: 98 IU/L (ref 44–121)
BUN/Creatinine Ratio: 19 (ref 9–23)
BUN: 13 mg/dL (ref 6–24)
Bilirubin Total: 0.5 mg/dL (ref 0.0–1.2)
CO2: 25 mmol/L (ref 20–29)
Calcium: 8.8 mg/dL (ref 8.7–10.2)
Chloride: 103 mmol/L (ref 96–106)
Creatinine, Ser: 0.7 mg/dL (ref 0.57–1.00)
Globulin, Total: 2.5 g/dL (ref 1.5–4.5)
Glucose: 114 mg/dL — ABNORMAL HIGH (ref 70–99)
Potassium: 4.1 mmol/L (ref 3.5–5.2)
Sodium: 141 mmol/L (ref 134–144)
Total Protein: 6.4 g/dL (ref 6.0–8.5)
eGFR: 101 mL/min/{1.73_m2} (ref >59)

## 2023-02-07 LAB — TSH: TSH: 1.96 u[IU]/mL (ref 0.450–4.500)

## 2023-02-07 LAB — LIPID PANEL
Chol/HDL Ratio: 2.9 ratio (ref 0.0–4.4)
Cholesterol, Total: 129 mg/dL (ref 100–199)
HDL: 45 mg/dL (ref >39)
LDL Chol Calc (NIH): 54 mg/dL (ref 0–99)
Triglycerides: 182 mg/dL — ABNORMAL HIGH (ref 0–149)
VLDL Cholesterol Cal: 30 mg/dL (ref 5–40)

## 2023-02-08 ENCOUNTER — Other Ambulatory Visit (HOSPITAL_COMMUNITY): Payer: Self-pay

## 2023-02-11 ENCOUNTER — Ambulatory Visit (INDEPENDENT_AMBULATORY_CARE_PROVIDER_SITE_OTHER): Payer: 59 | Admitting: Internal Medicine

## 2023-02-11 ENCOUNTER — Encounter: Payer: Self-pay | Admitting: Internal Medicine

## 2023-02-11 VITALS — BP 103/69 | HR 85 | Resp 12 | Ht <= 58 in | Wt 175.0 lb

## 2023-02-11 DIAGNOSIS — I10 Essential (primary) hypertension: Secondary | ICD-10-CM | POA: Diagnosis not present

## 2023-02-11 DIAGNOSIS — E1169 Type 2 diabetes mellitus with other specified complication: Secondary | ICD-10-CM | POA: Diagnosis not present

## 2023-02-11 DIAGNOSIS — F5101 Primary insomnia: Secondary | ICD-10-CM

## 2023-02-11 DIAGNOSIS — E782 Mixed hyperlipidemia: Secondary | ICD-10-CM | POA: Diagnosis not present

## 2023-02-11 DIAGNOSIS — Z794 Long term (current) use of insulin: Secondary | ICD-10-CM | POA: Diagnosis not present

## 2023-02-11 DIAGNOSIS — M4802 Spinal stenosis, cervical region: Secondary | ICD-10-CM | POA: Diagnosis not present

## 2023-02-11 MED ORDER — ZOLPIDEM TARTRATE 5 MG PO TABS: 5.0000 mg | ORAL_TABLET | Freq: Every evening | ORAL | 0 refills | Status: DC | PRN

## 2023-02-11 NOTE — Patient Instructions (Signed)
Please start taking Ambien as needed for insomnia.  Please continue taking other medications as prescribed.  Please continue to follow low carb diet and ambulate as tolerated.

## 2023-02-11 NOTE — Assessment & Plan Note (Signed)
On Zocor Check lipid profile

## 2023-02-11 NOTE — Assessment & Plan Note (Signed)
Lab Results  Component Value Date   HGBA1C 6.2 (H) 09/25/2022   Well controlled followed by Dr. Dorris Fetch now On Trulicity and Iva Boop Advised to follow diabetic diet On statin and ACEi F/u CMP and lipid panel Diabetic eye exam: Advised to follow up with Ophthalmology for diabetic eye exam

## 2023-02-11 NOTE — Assessment & Plan Note (Signed)
BP Readings from Last 1 Encounters:  02/11/23 103/69   Well-controlled with Lisinopril-HCTZ 20-12.5 mg QD Counseled for compliance with the medications Advised DASH diet and moderate exercise/walking as tolerated

## 2023-02-11 NOTE — Progress Notes (Unsigned)
Established Patient Office Visit  Subjective:  Patient ID: [name], female    DOB: 1966/08/22  Age: 57 y.o. MRN: [id number]  CC:  Chief Complaint  Patient presents with   Hypertension   Diabetes    HPI [name] is a 57 y.o. female with past medical history of DM, HTN, OSA, lumbar spinal stenosis and insomnia who presents for f/u of her chronic medical conditions.  HTN: BP is well-controlled. Takes medications regularly. Patient denies headache, dizziness, chest pain, dyspnea or palpitations.   Type II DM: She has been taking Synjardy and Trulicity.  Her HbA1c has improved to 6.2 now.  She states that her home blood glucose has been ranging around 130.  She has lost about 8 pounds since the last visit in 0000000 with Trulicity now.  She denies any polyuria, polydipsia or polyphagia.  She still complains of insomnia despite taking Vistaril.  She has difficulty initiating and maintaining sleep.  She used to take BZD in the past.  She has had cystoscopy with stent placement for retrieval of recurrent nephrolithiasis.  Followed by urology.  Her neck pain has improved now, but still has numbness and tingling of the right arm and hand.  She was referred to spine surgeon, has an appointment on 03/15.  Past Medical History:  Diagnosis Date   Diabetes mellitus    insulin pump 2013   Dysphagia 03/09/2020   Epidural hematoma ([place]) 12/14/2021   GERD (gastroesophageal reflux disease)    History of kidney stones    Hypercholesteremia    Hypertension    Incarcerated umbilical hernia [id number]   Kidney stone    Kidney stones 08/31/2008   Qualifier: Diagnosis of  By: Jonna Munro MD, Cornelius     Neuropathy    Obesity    Palpitations    Shortness of breath    Vertigo     Past Surgical History:  Procedure Laterality Date   BIOPSY  06/23/2020   Procedure: BIOPSY;  Surgeon: Daneil Dolin, MD;  [facility];  Service: Endoscopy;;   CARDIAC CATHETERIZATION  12/11/2012    normal coronary arteries   CESAREAN SECTION  1994;2000   [place]   COLONOSCOPY WITH PROPOFOL N/A 06/23/2020   large amount of stool in entire colon, prep inadequate.    CYSTOSCOPY W/ URETERAL STENT PLACEMENT  05/08/2012   Procedure: CYSTOSCOPY WITH RETROGRADE PYELOGRAM/URETERAL STENT PLACEMENT;  Surgeon: Marissa Nestle, MD;  Location: [facility];  Service: Urology;  Laterality: Right;   CYSTOSCOPY WITH RETROGRADE PYELOGRAM, URETEROSCOPY AND STENT PLACEMENT Right 05/04/2020   Procedure: CYSTOSCOPY WITH RIGHT RETROGRADE PYELOGRAM, RIGHT URETEROSCOPY AND RIGHT URETERAL STENT EXCHANGE;  Surgeon: Cleon Gustin, MD;  Location: [facility];  Service: Urology;  Laterality: Right;   CYSTOSCOPY WITH RETROGRADE PYELOGRAM, URETEROSCOPY AND STENT PLACEMENT Left 08/08/2020   Procedure: CYSTOSCOPY WITH RETROGRADE PYELOGRAM, URETEROSCOPY WITH LASER  AND STENT PLACEMENT;  Surgeon: Cleon Gustin, MD;  Location: [facility];  Service: Urology;  Laterality: Left;   CYSTOSCOPY WITH RETROGRADE PYELOGRAM, URETEROSCOPY AND STENT PLACEMENT Right 11/08/2020   Procedure: CYSTOSCOPY WITH RIGHT RETROGRADE PYELOGRAM, RIGHT URETEROSCOPY AND STENT PLACEMENT;  Surgeon: Cleon Gustin, MD;  [facility];  Service: Urology;  Laterality: Right;   CYSTOSCOPY WITH RETROGRADE PYELOGRAM, URETEROSCOPY AND STENT PLACEMENT Left 12/27/2020   Procedure: CYSTOSCOPY WITH LEFT RETROGRADE PYELOGRAM, LEFT URETEROSCOPY AND LEFT URETERAL STENT PLACEMENT;  Surgeon: Cleon Gustin, MD;  [facility];  Service: Urology;  Laterality: Left;   CYSTOSCOPY WITH RETROGRADE PYELOGRAM, URETEROSCOPY AND STENT PLACEMENT Bilateral 03/16/2021   Procedure: CYSTOSCOPY WITH BILATERAL  RETROGRADE PYELOGRAM, BILATERAL URETEROSCOPY AND STENT PLACEMENT ON THE LEFT;  Surgeon: Cleon Gustin, MD;  [facility];  Service: Urology;  Laterality: Bilateral;   CYSTOSCOPY WITH RETROGRADE PYELOGRAM, URETEROSCOPY AND STENT PLACEMENT  Left 06/28/2022   Procedure: CYSTOSCOPY WITH RETROGRADE PYELOGRAM, URETEROSCOPY AND STENT PLACEMENT;  Surgeon: Cleon Gustin, MD;  [facility];  Service: Urology;  Laterality: Left;   CYSTOSCOPY WITH RETROGRADE PYELOGRAM, URETEROSCOPY AND STENT PLACEMENT Bilateral 09/27/2022   Procedure: CYSTOSCOPY WITH BILATERAL RETROGRADE PYELOGRAM, URETEROSCOPY AND STENT PLACEMENT;  Surgeon: Cleon Gustin, MD;  [facility];  Service: Urology;  Laterality: Bilateral;  pt knows to arrive at 6:00   [place] Right 02/25/2020   Procedure: CYSTOSCOPY WITH RIGHT RETROGRADE RIGHT STENT PLACEMENT;  Surgeon: Festus Aloe, MD;  [facility];  Service: Urology;  Laterality: Right;   ESOPHAGOGASTRODUODENOSCOPY (EGD) WITH PROPOFOL N/A 06/23/2020   Normal esophagus, multiple localized erosions were found in gastric antrum s/p biopsy, normal duodenum. Empiric dilation.Reactive gastropathy with erosions, negative H.pylori, no dysplasia.     EXTRACORPOREAL SHOCK WAVE LITHOTRIPSY Right 10/11/2020   Procedure: EXTRACORPOREAL SHOCK WAVE LITHOTRIPSY (ESWL);  Surgeon: Cleon Gustin, MD;  [facility]: [facility];  Service: Urology;  Laterality: Right;   FOOT SURGERY Right March, 2013   [facility]-removal of bone spur   HOLMIUM LASER APPLICATION  123456   Procedure: HOLMIUM LASER LITHOTRIPSY RIGHT URETERAL CALCULUS;  Surgeon: Cleon Gustin, MD;  Location: [facility];  Service: Urology;;   HYSTEROSCOPY WITH THERMACHOICE  04/25/2012   Procedure: HYSTEROSCOPY WITH THERMACHOICE;  Surgeon: Florian Buff, MD;  Location: [facility];  Service: Gynecology;  Laterality: N/A;  total therapy time= 11 minutes 42 seconds; 34 ml D5w  in and 34 ml D5w out; temp =87 degrees F   LEFT HEART CATHETERIZATION WITH CORONARY ANGIOGRAM N/A 12/11/2012   Procedure: LEFT HEART CATHETERIZATION WITH CORONARY ANGIOGRAM;  Surgeon: Lorretta Harp, MD;  Location: [facility] C[facility];  Service: Cardiovascular;  Laterality:  N/A;   LUMBAR WOUND DEBRIDEMENT N/A 12/14/2021   Procedure: LUMBAR HEMATOMA EVACUATION;  Surgeon: Judith Part, MD;  Location: [place];  Service: Neurosurgery;  Laterality: N/A;   MALONEY DILATION N/A 06/23/2020   Procedure: Venia Minks DILATION;  Surgeon: Daneil Dolin, MD;  [facility];  Service: Endoscopy;  Laterality: N/A;   Sinus sergery  1988   [place]   STONE EXTRACTION WITH BASKET Right 11/08/2020   Procedure: STONE EXTRACTION WITH BASKET;  Surgeon: Cleon Gustin, MD;  Location: [facility];  Service: Urology;  Laterality: Right;   TRANSFORAMINAL LUMBAR INTERBODY FUSION (TLIF) WITH PEDICLE SCREW FIXATION 1 LEVEL N/A 12/05/2021   Procedure: Open Lumbar five-Sacral one Laminectomy/Transforaminal Lumbar Interbody Fusion/Posterolateral instrumented fusion;  Surgeon: Judith Part, MD;  Location: [place];  Service: Neurosurgery;  Laterality: N/A;  3C   UMBILICAL HERNIA REPAIR N/A 03/25/2014   Procedure: UMBILICAL HERNIA REPAIR;  Surgeon: Scherry Ran, MD;  Location: [facility];  Service: General;  Laterality: N/A;   uterine ablation      Family History  Problem Relation Age of Onset   Depression Paternal Grandfather    Depression Paternal Grandmother    Diabetes Father    Hypertension Mother    Cancer Mother    Diabetes Mother    Stroke Mother    Alzheimer's disease Mother    Cancer  Paternal Uncle    Heart disease Other    Cancer Other    Diabetes Other    Achalasia Other    Anesthesia problems Neg Hx    Hypotension Neg Hx    Malignant hyperthermia Neg Hx    Pseudochol deficiency Neg Hx    Colon cancer Neg Hx    Colon polyps Neg Hx     Social History   Socioeconomic History   Marital status: Married    Spouse name: Not on file   Number of children: 2   Years of education: college   Highest education level: Not on file  Occupational History   Occupation: CNA    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: [employer]   Occupation: Quarry manager- at [employer]  Tobacco Use   Smoking status: Never   Smokeless tobacco: Never  Vaping Use   Vaping Use: Never used  Substance and Sexual Activity   Alcohol use: No   Drug use: No   Sexual activity: Yes    Partners: Male    Birth control/protection: Post-menopausal    Comment: ablation  Other Topics Concern   Not on file  Social History Narrative   Regular exercise: walks Caffeine use:    Social Determinants of Health   Financial Resource Strain: Low Risk  (01/31/2023)   Overall Financial Resource Strain (CARDIA)    Difficulty of Paying Living Expenses: Not hard at all  Food Insecurity: No Food Insecurity (01/31/2023)   Hunger Vital Sign    Worried About Running Out of Food in the Last Year: Never true    [place] in the Last Year: Never true  Transportation Needs: No Transportation Needs (01/31/2023)   PRAPARE - Transportation    Lack of Transportation (Medical): No    Lack of Transportation (Non-Medical): No  Physical Activity: Insufficiently Active (01/31/2023)   Exercise Vital Sign    Days of Exercise per Week: 2 days    Minutes of Exercise per Session: 20 min  Stress: Stress Concern Present (01/31/2023)   [place]    Feeling of Stress : To some extent  Social Connections: Moderately Integrated (01/31/2023)   Social Connection and Isolation Panel [NHANES]    Frequency of Communication with Friends and Family: More than three times a week    Frequency of Social Gatherings with Friends and Family: Three times a week    Attends Religious Services: 1 to 4 times per year    Active Member of Clubs or Organizations: No    Attends Archivist Meetings: Never    Marital Status: Married  Human resources officer Violence: Not At Risk (01/31/2023)   Humiliation, Afraid, Rape, and Kick questionnaire    Fear of Current or Ex-Partner: No    Emotionally Abused: No    Physically Abused: No    Sexually Abused:  No    Outpatient Medications Prior to Visit  Medication Sig Dispense Refill   albuterol (VENTOLIN HFA) 108 (90 Base) MCG/ACT inhaler Inhale 2 puffs into the lungs every 6 (six) hours as needed for wheezing or shortness of breath.      blood glucose meter kit and supplies Use up to four times daily as directed. 1 each 0   Dulaglutide (TRULICITY) 3 0000000 SOPN Inject 3 mg into the skin once a week. 6 mL 1   Empagliflozin-metFORMIN HCl (SYNJARDY) 12.5-500 MG TABS Take 1 tablet by mouth daily. 90 tablet 0  furosemide (LASIX) 20 MG tablet Take 20 mg by mouth 2 (two) times daily as needed for edema.     gabapentin (NEURONTIN) 600 MG tablet Take 1 tablet (600 mg total) by mouth 3 (three) times daily. 90 tablet 2   glucose blood test strip Test up to 4 times a day 100 each 0   Lancets (FREESTYLE) lancets Use to test 4 times daily 100 each 0   lisinopril-hydrochlorothiazide (ZESTORETIC) 20-12.5 MG tablet Take 1 tablet by mouth daily. 90 tablet 3   meclizine (ANTIVERT) 25 MG tablet TAKE 1 TABLET(25 MG) BY MOUTH THREE TIMES DAILY AS NEEDED FOR DIZZINESS OR NAUSEA 30 tablet 0   omeprazole (PRILOSEC) 20 MG capsule Take 1 capsule (20 mg total) by mouth daily. 90 capsule 1   ondansetron (ZOFRAN) 4 MG tablet Take 1 tablet (4 mg total) by mouth daily as needed for nausea or vomiting. 30 tablet 1   oxyCODONE-acetaminophen (PERCOCET/ROXICET) 5-325 MG tablet Take 1 tablet by mouth every 8 (eight) hours as needed for severe pain. 30 tablet 0   Potassium Citrate (UROCIT-K 15) 15 MEQ (1620 MG) TBCR Take 1 tablet by mouth in the morning and at bedtime. 60 tablet 11   promethazine (PHENERGAN) 25 MG tablet TAKE 1 TABLET(25 MG) BY MOUTH EVERY 8 HOURS AS NEEDED FOR NAUSEA OR VOMITING 20 tablet 0   simvastatin (ZOCOR) 40 MG tablet Take 1 tablet (40 mg total) by mouth every evening for cholesterol 90 tablet 3   hydrOXYzine (VISTARIL) 50 MG capsule Take 1 capsule (50 mg total) by mouth daily. 30 capsule 0   LORazepam  (ATIVAN) 1 MG tablet Take 1 tablet 1 hour before procedure. Repeat dose after 30 mins if you have persistent anxiety. 2 tablet 0   No facility-administered medications prior to visit.    Allergies  Allergen Reactions   Ciprofloxacin Hives and Swelling   Penicillins Anaphylaxis and Rash   Codeine Other (See Comments)    had hallicunations   Morphine Nausea And Vomiting and Other (See Comments)    recieved in ED due to [facility] and had multple doses - gave visual hallucinations and vomitting.   Sulfonamide Derivatives Other (See Comments)    REACTION: Unsure - childhood allergy   Vancomycin Itching    ROS Review of Systems  Constitutional:  Negative for chills and fever.  HENT:  Negative for congestion, sinus pressure, sinus pain and sore throat.   Eyes:  Negative for pain and discharge.  Respiratory:  Negative for cough and shortness of breath.   Cardiovascular:  Negative for chest pain and palpitations.  Gastrointestinal:  Negative for abdominal pain, diarrhea, nausea and vomiting.  Endocrine: Negative for polydipsia and polyuria.  Genitourinary:  Negative for dysuria and hematuria.       Perineal itching  Musculoskeletal:  Positive for back pain. Negative for neck pain and neck stiffness.  Skin:  Negative for rash.  Neurological:  Negative for dizziness and weakness.  Psychiatric/Behavioral:  Positive for sleep disturbance. Negative for agitation and behavioral problems.       Objective:    Physical Exam Vitals reviewed.  Constitutional:      General: She is not in acute distress.    Appearance: She is obese. She is not diaphoretic.  HENT:     Head: Normocephalic and atraumatic.     Mouth/Throat:     Mouth: Mucous membranes are moist.     Pharynx: No posterior oropharyngeal erythema.  Eyes:     General: No scleral icterus.  Extraocular Movements: Extraocular movements intact.  Cardiovascular:     Rate and Rhythm: Normal rate and regular rhythm.     Pulses:  Normal pulses.     Heart sounds: Normal heart sounds. No murmur heard. Pulmonary:     Breath sounds: Normal breath sounds. No wheezing or rales.  Musculoskeletal:     Cervical back: Neck supple. No tenderness.     Right lower leg: No edema.     Left lower leg: No edema.  Skin:    General: Skin is warm.     Findings: No rash.  Neurological:     General: No focal deficit present.     Mental Status: She is alert and oriented to person, place, and time.  Psychiatric:        Mood and Affect: Mood normal.        Behavior: Behavior normal.     BP 103/69   Pulse 85   Resp 12   Ht '4\' 8"'$  (1.422 m)   Wt 175 lb (79.4 kg)   LMP 07/16/2012   SpO2 94%   BMI 39.23 kg/m  Wt Readings from Last 3 Encounters:  02/11/23 175 lb (79.4 kg)  01/31/23 175 lb (79.4 kg)  01/30/23 174 lb 12.8 oz (79.3 kg)    Lab Results  Component Value Date   TSH 1.960 02/06/2023   Lab Results  Component Value Date   WBC 7.8 07/02/2022   HGB 14.2 07/02/2022   HCT 43.9 07/02/2022   MCV 85.4 07/02/2022   PLT 302 07/02/2022   Lab Results  Component Value Date   NA 141 02/06/2023   K 4.1 02/06/2023   CO2 25 02/06/2023   GLUCOSE 114 (H) 02/06/2023   BUN 13 02/06/2023   CREATININE 0.70 02/06/2023   BILITOT 0.5 02/06/2023   ALKPHOS 98 02/06/2023   AST 17 02/06/2023   ALT 14 02/06/2023   PROT 6.4 02/06/2023   ALBUMIN 3.9 02/06/2023   CALCIUM 8.8 02/06/2023   ANIONGAP 8 09/25/2022   EGFR 101 02/06/2023   Lab Results  Component Value Date   CHOL 129 02/06/2023   Lab Results  Component Value Date   HDL 45 02/06/2023   Lab Results  Component Value Date   LDLCALC 54 02/06/2023   Lab Results  Component Value Date   TRIG 182 (H) 02/06/2023   Lab Results  Component Value Date   CHOLHDL 2.9 02/06/2023   Lab Results  Component Value Date   HGBA1C 6.2 (H) 09/25/2022      Assessment & Plan:   Problem List Items Addressed This Visit    Diabetes mellitus ([place]) Lab Results  Component  Value Date   HGBA1C 6.2 (H) 09/25/2022   Well controlled Associated with diabetic neuropathy, HTN and HLD followed by Dr. Dorris Fetch now On Trulicity and Synjardy Advised to follow diabetic diet On statin and ACEi F/u CMP and lipid panel Diabetic eye exam: Advised to follow up with Ophthalmology for diabetic eye exam  Essential hypertension, benign BP Readings from Last 1 Encounters:  02/11/23 103/69   Well-controlled with Lisinopril-HCTZ 20-12.5 mg QD Counseled for compliance with the medications Advised DASH diet and moderate exercise/walking as tolerated  Mixed hyperlipidemia On Zocor Check lipid profile  Primary insomnia Increased dose of Vistaril to 50 mg nightly as needed, but still has persistent concern Has been on Ativan and Xanax in the past Started Ambien Sleep hygiene discussed  Cervical spinal stenosis Previous MRI of cervical spine (2014) reviewed - showed cervical  spinal stenosis and neural foraminal narrowing Currently has radicular symptoms in RUE with limited ROM Check MRI of cervical spine to look for progression Percocet as needed for severe pain Continue gabapentin for neuropathic pain Referred to spine surgery   Meds ordered this encounter  Medications   zolpidem (AMBIEN) 5 MG tablet    Sig: Take 1 tablet (5 mg total) by mouth at bedtime as needed for sleep.    Dispense:  30 tablet    Refill:  0    Follow-up: Return in about 4 months (around 06/13/2023).    Lindell Spar, MD

## 2023-02-12 ENCOUNTER — Other Ambulatory Visit (HOSPITAL_COMMUNITY): Payer: Self-pay

## 2023-02-12 NOTE — Assessment & Plan Note (Signed)
Previous MRI of cervical spine (2014) reviewed - showed cervical spinal stenosis and neural foraminal narrowing Currently has radicular symptoms in RUE with limited ROM Check MRI of cervical spine to look for progression Percocet as needed for severe pain Continue gabapentin for neuropathic pain Referred to spine surgery

## 2023-02-12 NOTE — Assessment & Plan Note (Signed)
Increased dose of Vistaril to 50 mg nightly as needed, but still has persistent concern Has been on Ativan and Xanax in the past Started Ambien Sleep hygiene discussed

## 2023-02-14 ENCOUNTER — Ambulatory Visit (HOSPITAL_BASED_OUTPATIENT_CLINIC_OR_DEPARTMENT_OTHER): Payer: 59

## 2023-02-17 ENCOUNTER — Ambulatory Visit (HOSPITAL_COMMUNITY)
Admission: RE | Admit: 2023-02-17 | Discharge: 2023-02-17 | Disposition: A | Discharge status: Home / Self Care | Payer: 59 | Source: Ambulatory Visit | Attending: Internal Medicine | Admitting: Internal Medicine

## 2023-02-17 DIAGNOSIS — M542 Cervicalgia: Secondary | ICD-10-CM | POA: Diagnosis not present

## 2023-02-17 DIAGNOSIS — M4802 Spinal stenosis, cervical region: Secondary | ICD-10-CM | POA: Insufficient documentation

## 2023-02-19 ENCOUNTER — Telehealth: Payer: Self-pay | Admitting: Internal Medicine

## 2023-02-19 NOTE — Telephone Encounter (Signed)
Patient returning a call thinks its about her scan. Please return patient call.

## 2023-02-19 NOTE — Telephone Encounter (Signed)
Spoke to patient

## 2023-02-21 MED ORDER — CYCLOBENZAPRINE HCL 10 MG PO TABS: 10.0000 mg | ORAL_TABLET | Freq: Three times a day (TID) | ORAL | 0 refills | Status: DC | PRN

## 2023-02-21 NOTE — Telephone Encounter (Signed)
Return call to patient making her Per Dr. Alyson Ingles to refill Flexeril. Patient states she is concern and wants to know if she need to repeat a CT scan or KUB. Patient is aware that a task will be sent to Dr. Alyson Ingles. Patient voiced understanding

## 2023-02-22 ENCOUNTER — Ambulatory Visit (HOSPITAL_COMMUNITY): Payer: 59

## 2023-02-26 ENCOUNTER — Telehealth: Payer: Self-pay | Admitting: Internal Medicine

## 2023-02-26 ENCOUNTER — Other Ambulatory Visit: Payer: Self-pay | Admitting: Internal Medicine

## 2023-02-26 ENCOUNTER — Ambulatory Visit (HOSPITAL_COMMUNITY)
Admission: RE | Admit: 2023-02-26 | Discharge: 2023-02-26 | Disposition: A | Discharge status: Home / Self Care | Payer: 59 | Source: Ambulatory Visit | Attending: Internal Medicine | Admitting: Internal Medicine

## 2023-02-26 DIAGNOSIS — M7989 Other specified soft tissue disorders: Secondary | ICD-10-CM | POA: Diagnosis not present

## 2023-02-26 DIAGNOSIS — M79641 Pain in right hand: Secondary | ICD-10-CM | POA: Insufficient documentation

## 2023-02-26 NOTE — Telephone Encounter (Signed)
Patient advised.

## 2023-02-26 NOTE — Telephone Encounter (Signed)
Pt called stating her rt hand is swollen & painful all the time, goes numb, can't hold anything in that hand without dropping it. Wants to know if she can please get an xray?

## 2023-02-27 ENCOUNTER — Other Ambulatory Visit: Payer: Self-pay

## 2023-02-27 DIAGNOSIS — M069 Rheumatoid arthritis, unspecified: Secondary | ICD-10-CM

## 2023-03-07 ENCOUNTER — Other Ambulatory Visit (HOSPITAL_COMMUNITY): Payer: Self-pay

## 2023-03-07 ENCOUNTER — Other Ambulatory Visit: Payer: Self-pay

## 2023-03-07 ENCOUNTER — Other Ambulatory Visit: Payer: Self-pay | Admitting: Internal Medicine

## 2023-03-07 DIAGNOSIS — F5101 Primary insomnia: Secondary | ICD-10-CM

## 2023-03-07 MED ORDER — SYNJARDY 12.5-500 MG PO TABS
1.0000 | ORAL_TABLET | Freq: Every day | ORAL | 0 refills | Status: DC
  Filled 2023-03-07: qty 90, 90d supply, fill #0

## 2023-03-07 MED ORDER — LISINOPRIL-HYDROCHLOROTHIAZIDE 20-12.5 MG PO TABS
1.0000 | ORAL_TABLET | Freq: Every day | ORAL | 3 refills | Status: DC
  Filled 2023-03-07: qty 90, 90d supply, fill #0
  Filled 2023-05-22: qty 90, 90d supply, fill #1
  Filled 2023-05-24 – 2023-08-19 (×2): qty 90, 90d supply, fill #2
  Filled 2023-10-24 – 2023-11-25 (×2): qty 90, 90d supply, fill #3

## 2023-03-08 ENCOUNTER — Other Ambulatory Visit: Payer: Self-pay

## 2023-03-08 ENCOUNTER — Other Ambulatory Visit (HOSPITAL_COMMUNITY): Payer: Self-pay

## 2023-03-13 ENCOUNTER — Other Ambulatory Visit (HOSPITAL_COMMUNITY): Payer: Self-pay

## 2023-03-19 ENCOUNTER — Other Ambulatory Visit (HOSPITAL_COMMUNITY): Payer: Self-pay

## 2023-03-19 ENCOUNTER — Other Ambulatory Visit: Payer: Self-pay | Admitting: Internal Medicine

## 2023-03-19 DIAGNOSIS — F5101 Primary insomnia: Secondary | ICD-10-CM

## 2023-03-20 ENCOUNTER — Other Ambulatory Visit (HOSPITAL_COMMUNITY): Payer: Self-pay

## 2023-03-20 ENCOUNTER — Ambulatory Visit (HOSPITAL_COMMUNITY)
Admission: RE | Admit: 2023-03-20 | Discharge: 2023-03-20 | Disposition: A | Discharge status: Home / Self Care | Payer: 59 | Source: Ambulatory Visit | Attending: Urology | Admitting: Urology

## 2023-03-20 DIAGNOSIS — N200 Calculus of kidney: Secondary | ICD-10-CM

## 2023-03-22 ENCOUNTER — Ambulatory Visit: Payer: 59 | Admitting: Urology

## 2023-03-29 ENCOUNTER — Ambulatory Visit: Payer: 59 | Admitting: Urology

## 2023-03-31 ENCOUNTER — Other Ambulatory Visit: Payer: Self-pay | Admitting: Internal Medicine

## 2023-03-31 DIAGNOSIS — F5101 Primary insomnia: Secondary | ICD-10-CM

## 2023-04-01 ENCOUNTER — Ambulatory Visit: Payer: 59 | Admitting: Orthopedic Surgery

## 2023-04-01 VITALS — BP 123/79 | HR 91 | Ht <= 58 in | Wt 173.0 lb

## 2023-04-01 DIAGNOSIS — G5601 Carpal tunnel syndrome, right upper limb: Secondary | ICD-10-CM

## 2023-04-01 DIAGNOSIS — R202 Paresthesia of skin: Secondary | ICD-10-CM

## 2023-04-01 DIAGNOSIS — R200 Anesthesia of skin: Secondary | ICD-10-CM | POA: Diagnosis not present

## 2023-04-01 MED ORDER — MELOXICAM 7.5 MG PO TABS
7.5000 mg | ORAL_TABLET | Freq: Every day | ORAL | 5 refills | Status: DC
  Filled 2023-06-14: qty 30, 30d supply, fill #0
  Filled 2023-07-13: qty 30, 30d supply, fill #1
  Filled 2023-07-16 – 2023-08-19 (×2): qty 30, 30d supply, fill #2

## 2023-04-01 MED ORDER — ZOLPIDEM TARTRATE 5 MG PO TABS
5.0000 mg | ORAL_TABLET | Freq: Every evening | ORAL | 2 refills | Status: DC | PRN
  Filled 2023-06-14: qty 30, 30d supply, fill #0
  Filled 2023-07-10 – 2023-07-13 (×2): qty 30, 30d supply, fill #1

## 2023-04-01 NOTE — Patient Instructions (Signed)
You have been referred to Dr.Fred Newton at our sister office in [place] to discuss a nerve conduction study to see if you have carpal tunnel syndrome.  If you have not received a call from their office to schedule your consultation appointment in 3-5 business days, please call them to set that appointment up.   [facility] [address]  [place] PHONE: [phone]  **PLEASE NOTE: HIS OFFICE MAY HAVE TO GET APPROVAL FROM YOUR INSURANCE COMPANY FOR THAT FIRST VISIT. THIS MAY TAKE ADDITIONAL TIME AS MANY INSURANCES REQUIRE A REVIEW BEFORE THEY GRANT THE APPROVAL**  

## 2023-04-01 NOTE — Progress Notes (Signed)
Office Visit Note   Patient: [name]           Date of Birth: 1966-07-09           MRN: [id number] Visit Date: 04/01/2023 Requested by: Anabel Halon, MD [address] [place],  [place] [address] PCP: Anabel Halon, MD  Subjective: Chief Complaint  Patient presents with   Hand Pain    Right- pain interferes with ADL's has swelling hand stays cold all the time, numbness in whole hand had xray right hand and MRI C spine 3/41    HPI: 57 year old female right-hand-dominant pain numbness tingling thumb index long and part of the ring finger this is associated with a feeling of coolness to the right hand which began only a month or so ago.  She has difficulty holding things.  The hand feels tight.              ROS: Currently no symptoms back surgery went well  Assessment & Plan:  Images personally read and my interpretation : No imaging  Visit Diagnoses:  1. Carpal tunnel syndrome of right wrist   2. Numbness and tingling in right hand     Plan:  Vitamin B6 100 mg twice a day Continue the gabapentin Meloxicam Wrist splint Nerve study  Follow-Up Instructions: Return in about 6 weeks (around 05/13/2023) for FOLLOW UP, RIGHT, HAND, NCS,EMG RESULTS.   Orders:  Meds ordered this encounter  Medications   meloxicam (MOBIC) 7.5 MG tablet    Sig: Take 1 tablet (7.5 mg total) by mouth daily.    Dispense:  30 tablet    Refill:  5      Objective: Vital Signs: BP 123/79   Pulse 91   Ht  (1.422 m)   Wt 173 lb (78.5 kg)   LMP 07/16/2012   BMI 38.79 kg/m   Physical Exam Vitals and nursing note reviewed.  Constitutional:      Appearance: Normal appearance.  HENT:     Head: Normocephalic and atraumatic.  Eyes:     General: No scleral icterus.       Right eye: No discharge.        Left eye: No discharge.     Extraocular Movements: Extraocular movements intact.     Conjunctiva/sclera: Conjunctivae normal.     Pupils: Pupils are equal, round, and reactive to  light.  Cardiovascular:     Rate and Rhythm: Normal rate.     Pulses: Normal pulses.  Skin:    General: Skin is warm and dry.     Capillary Refill: Capillary refill takes less than 2 seconds.  Neurological:     General: No focal deficit present.     Mental Status: She is alert and oriented to person, place, and time.  Psychiatric:        Mood and Affect: Mood normal.        Behavior: Behavior normal.        Thought Content: Thought content normal.        Judgment: Judgment normal.      Ortho Exam Right and left hand exam  Left hand exam was normal  The right hand exam showed a warm extremity good color capillary refill but tingly sensation when I touched all her fingertips except the small finger and part of the ring finger the carpal tunnel/median nerve digits exhibit tingling and numbness on the volar aspect  Wrist range of motion normal Grip strength normal  No atrophy  Pain with pressure over the carpal tunnel  Positive Phalen's test  Tinel's test was negative but there was pain over the distal forearm as well   Specialty Comments:  No specialty comments available.  Imaging: No results found.   PMFS History: Patient Active Problem List   Diagnosis Date Noted   Cervical spinal stenosis 01/30/2023   Nodule of finger of right hand 01/22/2023   Acute idiopathic gout 01/01/2023   Vaginal itching 01/01/2023   Fecal incontinence 01/01/2023   Vertigo 08/09/2022   Chronic mucoid otitis media of left ear 05/16/2022   Primary insomnia 05/09/2022   ASCUS of cervix with negative high risk HPV 01/16/2022   Spondylolisthesis of lumbar region 12/05/2021   Spinal stenosis of lumbar region with neurogenic claudication 10/25/2021   Chronic bilateral low back pain with left-sided sciatica 10/18/2021   Constipation 01/18/2021   GERD (gastroesophageal reflux disease) 03/09/2020   Other intervertebral disc degeneration, lumbar region 05/24/2019   OSA (obstructive sleep  apnea) 12/08/2014   Depression 07/15/2013   Dyspnea 11/12/2012   Headache(784.0) 10/14/2012   Neuropathy 10/14/2012   Essential hypertension, benign 05/10/2012   Diabetes mellitus 09/29/2008   Mixed hyperlipidemia 09/29/2008   Class 2 severe obesity due to excess calories with serious comorbidity and body mass index (BMI) of 38.0 to 38.9 in adult 08/31/2008   Recurrent nephrolithiasis 08/31/2008   Past Medical History:  Diagnosis Date   Diabetes mellitus    insulin pump 2013   Dysphagia 03/09/2020   Epidural hematoma (HCC) 12/14/2021   GERD (gastroesophageal reflux disease)    History of kidney stones    Hypercholesteremia    Hypertension    Incarcerated umbilical hernia 03/25/2014   Kidney stone    Kidney stones 08/31/2008   Qualifier: Diagnosis of  By: Erby Pian MD, Cornelius     Neuropathy    Obesity    Palpitations    Shortness of breath    Vertigo     Family History  Problem Relation Age of Onset   Depression Paternal Grandfather    Depression Paternal Grandmother    Diabetes Father    Hypertension Mother    Cancer Mother    Diabetes Mother    Stroke Mother    Alzheimer's disease Mother    Cancer Paternal Uncle    Heart disease Other    Cancer Other    Diabetes Other    Achalasia Other    Anesthesia problems Neg Hx    Hypotension Neg Hx    Malignant hyperthermia Neg Hx    Pseudochol deficiency Neg Hx    Colon cancer Neg Hx    Colon polyps Neg Hx     Past Surgical History:  Procedure Laterality Date   BIOPSY  06/23/2020   Procedure: BIOPSY;  Surgeon: Corbin Ade, MD;  [facility]: [facility];  Service: Endoscopy;;   CARDIAC CATHETERIZATION  12/11/2012   normal coronary arteries   CESAREAN SECTION  1994;2000   [facility]   CHOLECYSTECTOMY  1992   COLONOSCOPY WITH PROPOFOL N/A 06/23/2020   large amount of stool in entire colon, prep inadequate.    CYSTOSCOPY W/ URETERAL STENT PLACEMENT  05/08/2012   Procedure: CYSTOSCOPY WITH RETROGRADE  PYELOGRAM/URETERAL STENT PLACEMENT;  Surgeon: Ky Barban, MD;  Location: [facility];  Service: Urology;  Laterality: Right;   CYSTOSCOPY WITH RETROGRADE PYELOGRAM, URETEROSCOPY AND STENT PLACEMENT Right 05/04/2020   Procedure: CYSTOSCOPY WITH RIGHT RETROGRADE PYELOGRAM, RIGHT URETEROSCOPY AND RIGHT URETERAL STENT EXCHANGE;  Surgeon: Ronne Binning,  Mardene Celeste, MD;  Location: [facility];  Service: Urology;  Laterality: Right;   CYSTOSCOPY WITH RETROGRADE PYELOGRAM, URETEROSCOPY AND STENT PLACEMENT Left 08/08/2020   Procedure: CYSTOSCOPY WITH RETROGRADE PYELOGRAM, URETEROSCOPY WITH LASER  AND STENT PLACEMENT;  Surgeon: Malen Gauze, MD;  Location: [facility];  Service: Urology;  Laterality: Left;   CYSTOSCOPY WITH RETROGRADE PYELOGRAM, URETEROSCOPY AND STENT PLACEMENT Right 11/08/2020   Procedure: CYSTOSCOPY WITH RIGHT RETROGRADE PYELOGRAM, RIGHT URETEROSCOPY AND STENT PLACEMENT;  Surgeon: Malen Gauze, MD;  Location: [facility];  Service: Urology;  Laterality: Right;   CYSTOSCOPY WITH RETROGRADE PYELOGRAM, URETEROSCOPY AND STENT PLACEMENT Left 12/27/2020   Procedure: CYSTOSCOPY WITH LEFT RETROGRADE PYELOGRAM, LEFT URETEROSCOPY AND LEFT URETERAL STENT PLACEMENT;  Surgeon: Malen Gauze, MD;  Location: [facility];  Service: Urology;  Laterality: Left;   CYSTOSCOPY WITH RETROGRADE PYELOGRAM, URETEROSCOPY AND STENT PLACEMENT Bilateral 03/16/2021   Procedure: CYSTOSCOPY WITH BILATERAL  RETROGRADE PYELOGRAM, BILATERAL URETEROSCOPY AND STENT PLACEMENT ON THE LEFT;  Surgeon: Malen Gauze, MD;  Location: [facility];  Service: Urology;  Laterality: Bilateral;   CYSTOSCOPY WITH RETROGRADE PYELOGRAM, URETEROSCOPY AND STENT PLACEMENT Left 06/28/2022   Procedure: CYSTOSCOPY WITH RETROGRADE PYELOGRAM, URETEROSCOPY AND STENT PLACEMENT;  Surgeon: Malen Gauze, MD;  Location: [facility];  Service: Urology;  Laterality: Left;   CYSTOSCOPY WITH RETROGRADE PYELOGRAM, URETEROSCOPY AND STENT PLACEMENT Bilateral 09/27/2022    Procedure: CYSTOSCOPY WITH BILATERAL RETROGRADE PYELOGRAM, URETEROSCOPY AND STENT PLACEMENT;  Surgeon: Malen Gauze, MD;  Location: [facility];  Service: Urology;  Laterality: Bilateral;  pt knows to arrive at 6:00   CYSTOSCOPY WITH STENT PLACEMENT Right 02/25/2020   Procedure: CYSTOSCOPY WITH RIGHT RETROGRADE RIGHT STENT PLACEMENT;  Surgeon: Jerilee Field, MD;  Location: [facility];  Service: Urology;  Laterality: Right;   ESOPHAGOGASTRODUODENOSCOPY (EGD) WITH PROPOFOL N/A 06/23/2020   Normal esophagus, multiple localized erosions were found in gastric antrum s/p biopsy, normal duodenum. Empiric dilation.Reactive gastropathy with erosions, negative H.pylori, no dysplasia.     EXTRACORPOREAL SHOCK WAVE LITHOTRIPSY Right 10/11/2020   Procedure: EXTRACORPOREAL SHOCK WAVE LITHOTRIPSY (ESWL);  Surgeon: Malen Gauze, MD;  Location: [facility];  Service: Urology;  Laterality: Right;   FOOT SURGERY Right March, 2013   [facility]-removal of bone spur   HOLMIUM LASER APPLICATION  05/04/2020   Procedure: HOLMIUM LASER LITHOTRIPSY RIGHT URETERAL CALCULUS;  Surgeon: Malen Gauze, MD;  Location: [facility];  Service: Urology;;   HYSTEROSCOPY WITH THERMACHOICE  04/25/2012   Procedure: HYSTEROSCOPY WITH THERMACHOICE;  Surgeon: Lazaro Arms, MD;  Location: [facility];  Service: Gynecology;  Laterality: N/A;  total therapy time= 11 minutes 42 seconds; 34 ml D5w  in and 34 ml D5w out; temp =87 degrees F   LEFT HEART CATHETERIZATION WITH CORONARY ANGIOGRAM N/A 12/11/2012   Procedure: LEFT HEART CATHETERIZATION WITH CORONARY ANGIOGRAM;  Surgeon: Runell Gess, MD;  Location: [facility] CATH [facility];  Service: Cardiovascular;  Laterality: N/A;   LUMBAR WOUND DEBRIDEMENT N/A 12/14/2021   Procedure: LUMBAR HEMATOMA EVACUATION;  Surgeon: Jadene Pierini, MD;  Location: [facility];  Service: Neurosurgery;  Laterality: N/A;   MALONEY DILATION N/A 06/23/2020   Procedure: [name] DILATION;  Surgeon: Corbin Ade, MD;  [facility];  Service: Endoscopy;  Laterality: N/A;   Sinus sergery  1988   [place]   STONE EXTRACTION WITH BASKET Right 11/08/2020   Procedure: STONE EXTRACTION WITH BASKET;  Surgeon: Malen Gauze, MD;  Location: [facility];  Service: Urology;  Laterality: Right;  TRANSFORAMINAL LUMBAR INTERBODY FUSION (TLIF) WITH PEDICLE SCREW FIXATION 1 LEVEL N/A 12/05/2021   Procedure: Open Lumbar five-Sacral one Laminectomy/Transforaminal Lumbar Interbody Fusion/Posterolateral instrumented fusion;  Surgeon: Jadene Pierini, MD;  Location: [facility];  Service: Neurosurgery;  Laterality: N/A;  3C   UMBILICAL HERNIA REPAIR N/A 03/25/2014   Procedure: UMBILICAL HERNIA REPAIR;  Surgeon: Marlane Hatcher, MD;  Location: [facility];  Service: General;  Laterality: N/A;   uterine ablation     Social History   Occupational History   Occupation: CNA    Employer: Audiological scientist: [employer]   Occupation: Lawyer- at [employer]  Tobacco Use   Smoking status: Never   Smokeless tobacco: Never  Vaping Use   Vaping Use: Never used  Substance and Sexual Activity   Alcohol use: No   Drug use: No   Sexual activity: Yes    Partners: Male    Birth control/protection: Post-menopausal    Comment: ablation

## 2023-04-03 ENCOUNTER — Telehealth: Payer: Self-pay | Admitting: Physical Medicine and Rehabilitation

## 2023-04-03 NOTE — Telephone Encounter (Signed)
Patient has a referral and missed a call.. please call after 3 pm

## 2023-04-03 NOTE — Telephone Encounter (Signed)
Spoke with patient and scheduled NCV for 04/19/23.

## 2023-04-05 ENCOUNTER — Other Ambulatory Visit: Payer: Self-pay | Admitting: Internal Medicine

## 2023-04-05 ENCOUNTER — Other Ambulatory Visit (HOSPITAL_COMMUNITY): Payer: Self-pay

## 2023-04-05 DIAGNOSIS — F5101 Primary insomnia: Secondary | ICD-10-CM

## 2023-04-05 DIAGNOSIS — M47816 Spondylosis without myelopathy or radiculopathy, lumbar region: Secondary | ICD-10-CM | POA: Diagnosis not present

## 2023-04-05 DIAGNOSIS — M4310 Spondylolisthesis, site unspecified: Secondary | ICD-10-CM | POA: Diagnosis not present

## 2023-04-05 MED ORDER — HYDROXYZINE PAMOATE 50 MG PO CAPS
50.0000 mg | ORAL_CAPSULE | Freq: Every day | ORAL | 0 refills | Status: DC | PRN
Start: 2023-04-05 — End: 2023-04-08
  Filled 2023-04-05: qty 30, 30d supply, fill #0

## 2023-04-08 ENCOUNTER — Other Ambulatory Visit: Payer: Self-pay | Admitting: Internal Medicine

## 2023-04-08 DIAGNOSIS — F5101 Primary insomnia: Secondary | ICD-10-CM

## 2023-04-09 ENCOUNTER — Other Ambulatory Visit (HOSPITAL_COMMUNITY): Payer: Self-pay

## 2023-04-09 MED ORDER — HYDROXYZINE PAMOATE 50 MG PO CAPS
50.0000 mg | ORAL_CAPSULE | Freq: Every day | ORAL | 0 refills | Status: DC | PRN
Start: 2023-04-09 — End: 2023-05-14
  Filled 2023-04-09 – 2023-05-02 (×2): qty 30, 30d supply, fill #0

## 2023-04-12 ENCOUNTER — Other Ambulatory Visit: Payer: Self-pay | Admitting: Urology

## 2023-04-12 DIAGNOSIS — R109 Unspecified abdominal pain: Secondary | ICD-10-CM

## 2023-04-12 DIAGNOSIS — N200 Calculus of kidney: Secondary | ICD-10-CM

## 2023-04-15 ENCOUNTER — Other Ambulatory Visit (HOSPITAL_COMMUNITY): Payer: Self-pay

## 2023-04-15 ENCOUNTER — Other Ambulatory Visit: Payer: Self-pay

## 2023-04-15 ENCOUNTER — Encounter: Payer: Self-pay | Admitting: Internal Medicine

## 2023-04-15 ENCOUNTER — Ambulatory Visit (INDEPENDENT_AMBULATORY_CARE_PROVIDER_SITE_OTHER): Payer: 59 | Admitting: Internal Medicine

## 2023-04-15 VITALS — BP 117/74 | HR 84 | Ht <= 58 in | Wt 178.2 lb

## 2023-04-15 DIAGNOSIS — R300 Dysuria: Secondary | ICD-10-CM

## 2023-04-15 DIAGNOSIS — L720 Epidermal cyst: Secondary | ICD-10-CM | POA: Insufficient documentation

## 2023-04-15 DIAGNOSIS — N760 Acute vaginitis: Secondary | ICD-10-CM | POA: Insufficient documentation

## 2023-04-15 LAB — POCT URINALYSIS DIP (CLINITEK)
Bilirubin, UA: NEGATIVE
Blood, UA: NEGATIVE
Glucose, UA: 500 mg/dL — AB
Ketones, POC UA: NEGATIVE mg/dL
Leukocytes, UA: NEGATIVE
Nitrite, UA: NEGATIVE
POC PROTEIN,UA: NEGATIVE
Spec Grav, UA: 1.025 (ref 1.010–1.025)
Urobilinogen, UA: 0.2 E.U./dL
pH, UA: 5.5 (ref 5.0–8.0)

## 2023-04-15 MED ORDER — CYCLOBENZAPRINE HCL 10 MG PO TABS
10.0000 mg | ORAL_TABLET | Freq: Three times a day (TID) | ORAL | 0 refills | Status: DC | PRN
Start: 2023-04-15 — End: 2023-05-02
  Filled 2023-04-15: qty 30, 10d supply, fill #0

## 2023-04-15 MED ORDER — FLUCONAZOLE 150 MG PO TABS
150.0000 mg | ORAL_TABLET | Freq: Once | ORAL | 0 refills | Status: AC
Start: 2023-04-15 — End: 2023-04-15

## 2023-04-15 NOTE — Progress Notes (Signed)
Acute Office Visit  Subjective:    Patient ID: [name], female    DOB: 11-Oct-1966, 57 y.o.   MRN: [id number]  Chief Complaint  Patient presents with   Urinary Tract Infection    Patient states she has uncomfortable feeling, burning and itching.     HPI Patient is in today for complaint of burning and itching in the perineal area especially during urination.  Denies any fever, chills, nausea, vomiting or hematuria.  Of note, her UA was negative for LE and Nitrite today.  She takes Synjardy for type II DM.  Denies any history of vaginitis.  She is not sexually active currently.  She reports a scalp mass as well, which is chronic and denies any change in size. She has noticed mild soreness at times. Denies any head injury.  Past Medical History:  Diagnosis Date   Diabetes mellitus    insulin pump 2013   Dysphagia 03/09/2020   Epidural hematoma (HCC) 12/14/2021   GERD (gastroesophageal reflux disease)    History of kidney stones    Hypercholesteremia    Hypertension    Incarcerated umbilical hernia 03/25/2014   Kidney stone    Kidney stones 08/31/2008   Qualifier: Diagnosis of  By: Erby Pian MD, Cornelius     Neuropathy    Obesity    Palpitations    Shortness of breath    Vertigo     Past Surgical History:  Procedure Laterality Date   BIOPSY  06/23/2020   Procedure: BIOPSY;  Surgeon: Corbin Ade, MD;  [facility];  Service: Endoscopy;;   CARDIAC CATHETERIZATION  12/11/2012   normal coronary arteries   CESAREAN SECTION  1994;2000   [facility]   CHOLECYSTECTOMY  1992   COLONOSCOPY WITH PROPOFOL N/A 06/23/2020   large amount of stool in entire colon, prep inadequate.    CYSTOSCOPY W/ URETERAL STENT PLACEMENT  05/08/2012   Procedure: CYSTOSCOPY WITH RETROGRADE PYELOGRAM/URETERAL STENT PLACEMENT;  Surgeon: Ky Barban, MD;  Location: [facility];  Service: Urology;  Laterality: Right;   CYSTOSCOPY WITH RETROGRADE PYELOGRAM, URETEROSCOPY AND STENT PLACEMENT  Right 05/04/2020   Procedure: CYSTOSCOPY WITH RIGHT RETROGRADE PYELOGRAM, RIGHT URETEROSCOPY AND RIGHT URETERAL STENT EXCHANGE;  Surgeon: Malen Gauze, MD;  Location: [facility];  Service: Urology;  Laterality: Right;   CYSTOSCOPY WITH RETROGRADE PYELOGRAM, URETEROSCOPY AND STENT PLACEMENT Left 08/08/2020   Procedure: CYSTOSCOPY WITH RETROGRADE PYELOGRAM, URETEROSCOPY WITH LASER  AND STENT PLACEMENT;  Surgeon: Malen Gauze, MD;  Location: [facility];  Service: Urology;  Laterality: Left;   CYSTOSCOPY WITH RETROGRADE PYELOGRAM, URETEROSCOPY AND STENT PLACEMENT Right 11/08/2020   Procedure: CYSTOSCOPY WITH RIGHT RETROGRADE PYELOGRAM, RIGHT URETEROSCOPY AND STENT PLACEMENT;  Surgeon: Malen Gauze, MD;  Location: [facility];  Service: Urology;  Laterality: Right;   CYSTOSCOPY WITH RETROGRADE PYELOGRAM, URETEROSCOPY AND STENT PLACEMENT Left 12/27/2020   Procedure: CYSTOSCOPY WITH LEFT RETROGRADE PYELOGRAM, LEFT URETEROSCOPY AND LEFT URETERAL STENT PLACEMENT;  Surgeon: Malen Gauze, MD;  Location: [facility];  Service: Urology;  Laterality: Left;   CYSTOSCOPY WITH RETROGRADE PYELOGRAM, URETEROSCOPY AND STENT PLACEMENT Bilateral 03/16/2021   Procedure: CYSTOSCOPY WITH BILATERAL  RETROGRADE PYELOGRAM, BILATERAL URETEROSCOPY AND STENT PLACEMENT ON THE LEFT;  Surgeon: Malen Gauze, MD;  Location: [facility];  Service: Urology;  Laterality: Bilateral;   CYSTOSCOPY WITH RETROGRADE PYELOGRAM, URETEROSCOPY AND STENT PLACEMENT Left 06/28/2022   Procedure: CYSTOSCOPY WITH RETROGRADE PYELOGRAM, URETEROSCOPY AND STENT PLACEMENT;  Surgeon: Malen Gauze, MD;  Location: [facility];  Service: Urology;  Laterality: Left;   CYSTOSCOPY WITH RETROGRADE PYELOGRAM, URETEROSCOPY AND STENT PLACEMENT Bilateral 09/27/2022   Procedure: CYSTOSCOPY WITH BILATERAL RETROGRADE PYELOGRAM, URETEROSCOPY AND STENT PLACEMENT;  Surgeon: Malen Gauze, MD;  Location: [facility];  Service: Urology;  Laterality: Bilateral;  pt knows to  arrive at 6:00   CYSTOSCOPY WITH STENT PLACEMENT Right 02/25/2020   Procedure: CYSTOSCOPY WITH RIGHT RETROGRADE RIGHT STENT PLACEMENT;  Surgeon: Jerilee Field, MD;  Location: [facility];  Service: Urology;  Laterality: Right;   ESOPHAGOGASTRODUODENOSCOPY (EGD) WITH PROPOFOL N/A 06/23/2020   Normal esophagus, multiple localized erosions were found in gastric antrum s/p biopsy, normal duodenum. Empiric dilation.Reactive gastropathy with erosions, negative H.pylori, no dysplasia.     EXTRACORPOREAL SHOCK WAVE LITHOTRIPSY Right 10/11/2020   Procedure: EXTRACORPOREAL SHOCK WAVE LITHOTRIPSY (ESWL);  Surgeon: Malen Gauze, MD;  Location: [facility];  Service: Urology;  Laterality: Right;   FOOT SURGERY Right March, 2013   [facility]-removal of bone spur   HOLMIUM LASER APPLICATION  05/04/2020   Procedure: HOLMIUM LASER LITHOTRIPSY RIGHT URETERAL CALCULUS;  Surgeon: Malen Gauze, MD;  Location: [facility];  Service: Urology;;   HYSTEROSCOPY WITH THERMACHOICE  04/25/2012   Procedure: HYSTEROSCOPY WITH THERMACHOICE;  Surgeon: Lazaro Arms, MD;  Location: [facility];  Service: Gynecology;  Laterality: N/A;  total therapy time= 11 minutes 42 seconds; 34 ml D5w  in and 34 ml D5w out; temp =87 degrees F   LEFT HEART CATHETERIZATION WITH CORONARY ANGIOGRAM N/A 12/11/2012   Procedure: LEFT HEART CATHETERIZATION WITH CORONARY ANGIOGRAM;  Surgeon: Runell Gess, MD;  Location: [facility] CATH LAB;  Service: Cardiovascular;  Laterality: N/A;   LUMBAR WOUND DEBRIDEMENT N/A 12/14/2021   Procedure: LUMBAR HEMATOMA EVACUATION;  Surgeon: Jadene Pierini, MD;  Location: [facility];  Service: Neurosurgery;  Laterality: N/A;   MALONEY DILATION N/A 06/23/2020   Procedure: [name] DILATION;  Surgeon: Corbin Ade, MD;  [facility];  Service: Endoscopy;  Laterality: N/A;   Sinus sergery  1988   [place]   STONE EXTRACTION WITH BASKET Right 11/08/2020   Procedure: STONE EXTRACTION WITH BASKET;  Surgeon: Malen Gauze, MD;  Location: [facility];  Service: Urology;  Laterality: Right;   TRANSFORAMINAL LUMBAR INTERBODY FUSION (TLIF) WITH PEDICLE SCREW FIXATION 1 LEVEL N/A 12/05/2021   Procedure: Open Lumbar five-Sacral one Laminectomy/Transforaminal Lumbar Interbody Fusion/Posterolateral instrumented fusion;  Surgeon: Jadene Pierini, MD;  Location: [facility];  Service: Neurosurgery;  Laterality: N/A;  3C   UMBILICAL HERNIA REPAIR N/A 03/25/2014   Procedure: UMBILICAL HERNIA REPAIR;  Surgeon: Marlane Hatcher, MD;  Location: [facility];  Service: General;  Laterality: N/A;   uterine ablation      Family History  Problem Relation Age of Onset   Depression Paternal Grandfather    Depression Paternal Grandmother    Diabetes Father    Hypertension Mother    Cancer Mother    Diabetes Mother    Stroke Mother    Alzheimer's disease Mother    Cancer Paternal Uncle    Heart disease Other    Cancer Other    Diabetes Other    Achalasia Other    Anesthesia problems Neg Hx    Hypotension Neg Hx    Malignant hyperthermia Neg Hx    Pseudochol deficiency Neg Hx    Colon cancer Neg Hx    Colon polyps Neg Hx     Social History   Socioeconomic History   Marital status: Married  Spouse name: Not on file   Number of children: 2   Years of education: college   Highest education level: Some college, no degree  Occupational History   Occupation: Scientist, research (medical): Audiological scientist: [employer]   Occupation: Lawyer- at [employer]  Tobacco Use   Smoking status: Never   Smokeless tobacco: Never  Vaping Use   Vaping Use: Never used  Substance and Sexual Activity   Alcohol use: No   Drug use: No   Sexual activity: Yes    Partners: Male    Birth control/protection: Post-menopausal    Comment: ablation  Other Topics Concern   Not on file  Social History Narrative   Regular exercise: walks Caffeine use:    Social Determinants of Health   Financial Resource Strain: Low Risk   (04/12/2023)   Overall Financial Resource Strain (CARDIA)    Difficulty of Paying Living Expenses: Not hard at all  Food Insecurity: No Food Insecurity (04/12/2023)   Hunger Vital Sign    Worried About Running Out of Food in the Last Year: Never true    Ran Out of Food in the Last Year: Never true  Transportation Needs: No Transportation Needs (04/12/2023)   PRAPARE - Administrator, Civil Service (Medical): No    Lack of Transportation (Non-Medical): No  Physical Activity: Insufficiently Active (04/12/2023)   Exercise Vital Sign    Days of Exercise per Week: 2 days    Minutes of Exercise per Session: 10 min  Stress: Stress Concern Present (04/12/2023)   [employer] of Occupational Health - Occupational Stress Questionnaire    Feeling of Stress : To some extent  Social Connections: Moderately Integrated (04/12/2023)   Social Connection and Isolation Panel [NHANES]    Frequency of Communication with Friends and Family: More than three times a week    Frequency of Social Gatherings with Friends and Family: Once a week    Attends Religious Services: 1 to 4 times per year    Active Member of [employer] or Organizations: No    Attends Banker Meetings: Never    Marital Status: Married  Catering manager Violence: Not At Risk (01/31/2023)   Humiliation, Afraid, Rape, and Kick questionnaire    Fear of Current or Ex-Partner: No    Emotionally Abused: No    Physically Abused: No    Sexually Abused: No    Outpatient Medications Prior to Visit  Medication Sig Dispense Refill   albuterol (VENTOLIN HFA) 108 (90 Base) MCG/ACT inhaler Inhale 2 puffs into the lungs every 6 (six) hours as needed for wheezing or shortness of breath.      blood glucose meter kit and supplies Use up to four times daily as directed. 1 each 0   cyclobenzaprine (FLEXERIL) 10 MG tablet Take 1 tablet (10 mg total) by mouth 3 (three) times daily as needed for muscle spasms. 30 tablet 0   Dulaglutide  (TRULICITY) 3 MG/0.5ML SOPN Inject 3 mg into the skin once a week. 6 mL 1   Empagliflozin-metFORMIN HCl (SYNJARDY) 12.5-500 MG TABS Take 1 tablet by mouth daily. 90 tablet 0   furosemide (LASIX) 20 MG tablet Take 20 mg by mouth 2 (two) times daily as needed for edema.     gabapentin (NEURONTIN) 600 MG tablet Take 1 tablet (600 mg total) by mouth 3 (three) times daily. 90 tablet 2   glucose blood test strip Test up to 4 times a  day 100 each 0   hydrOXYzine (VISTARIL) 50 MG capsule Take 1 capsule (50 mg total) by mouth daily as needed for anxiety (Insomnia). 30 capsule 0   Lancets (FREESTYLE) lancets Use to test 4 times daily 100 each 0   lisinopril-hydrochlorothiazide (ZESTORETIC) 20-12.5 MG tablet Take 1 tablet by mouth daily. 90 tablet 3   meclizine (ANTIVERT) 25 MG tablet TAKE 1 TABLET(25 MG) BY MOUTH THREE TIMES DAILY AS NEEDED FOR DIZZINESS OR NAUSEA 30 tablet 0   meloxicam (MOBIC) 7.5 MG tablet Take 1 tablet (7.5 mg total) by mouth daily. 30 tablet 5   omeprazole (PRILOSEC) 20 MG capsule Take 1 capsule (20 mg total) by mouth daily. 90 capsule 1   ondansetron (ZOFRAN) 4 MG tablet Take 1 tablet (4 mg total) by mouth daily as needed for nausea or vomiting. 30 tablet 1   oxyCODONE-acetaminophen (PERCOCET/ROXICET) 5-325 MG tablet Take 1 tablet by mouth every 8 (eight) hours as needed for severe pain. 30 tablet 0   Potassium Citrate (UROCIT-K 15) 15 MEQ (1620 MG) TBCR Take 1 tablet by mouth in the morning and at bedtime. 60 tablet 11   promethazine (PHENERGAN) 25 MG tablet TAKE 1 TABLET(25 MG) BY MOUTH EVERY 8 HOURS AS NEEDED FOR NAUSEA OR VOMITING 20 tablet 0   simvastatin (ZOCOR) 40 MG tablet Take 1 tablet (40 mg total) by mouth every evening for cholesterol 90 tablet 3   zolpidem (AMBIEN) 5 MG tablet Take 1 tablet (5 mg total) by mouth at bedtime as needed for sleep. 30 tablet 2   No facility-administered medications prior to visit.    Allergies  Allergen Reactions   Ciprofloxacin Hives and  Swelling   Penicillins Anaphylaxis and Rash   Codeine Other (See Comments)    had hallicunations   Morphine Nausea And Vomiting and Other (See Comments)    recieved in ED due to [facility] and had multple doses - gave visual hallucinations and vomitting.   Sulfonamide Derivatives Other (See Comments)    REACTION: Unsure - childhood allergy   Vancomycin Itching    Review of Systems  Constitutional:  Negative for chills and fever.  HENT:  Negative for congestion, sinus pressure, sinus pain and sore throat.   Eyes:  Negative for pain and discharge.  Respiratory:  Negative for cough and shortness of breath.   Cardiovascular:  Negative for chest pain and palpitations.  Gastrointestinal:  Negative for abdominal pain, diarrhea, nausea and vomiting.  Endocrine: Negative for polydipsia and polyuria.  Genitourinary:  Positive for dysuria. Negative for hematuria and vaginal discharge.       Perineal itching  Musculoskeletal:  Positive for back pain. Negative for neck pain and neck stiffness.  Skin:  Negative for rash.  Neurological:  Negative for dizziness and weakness.  Psychiatric/Behavioral:  Positive for sleep disturbance. Negative for agitation and behavioral problems.        Objective:    Physical Exam Vitals reviewed.  Constitutional:      General: She is not in acute distress.    Appearance: She is obese. She is not diaphoretic.  HENT:     Head: Normocephalic and atraumatic.      Comments: Mass over scalp, nontender, about 3 cm in diameter    Mouth/Throat:     Mouth: Mucous membranes are moist.     Pharynx: No posterior oropharyngeal erythema.  Eyes:     General: No scleral icterus.    Extraocular Movements: Extraocular movements intact.  Cardiovascular:     Rate  and Rhythm: Normal rate and regular rhythm.     Pulses: Normal pulses.     Heart sounds: Normal heart sounds. No murmur heard. Pulmonary:     Breath sounds: Normal breath sounds. No wheezing or rales.   Abdominal:     Palpations: Abdomen is soft.     Tenderness: There is no abdominal tenderness. There is no right CVA tenderness, left CVA tenderness, guarding or rebound.  Musculoskeletal:     Cervical back: Neck supple. No tenderness.     Right lower leg: No edema.     Left lower leg: No edema.  Skin:    General: Skin is warm.     Findings: No rash.  Neurological:     General: No focal deficit present.     Mental Status: She is alert and oriented to person, place, and time.  Psychiatric:        Mood and Affect: Mood normal.        Behavior: Behavior normal.     BP 117/74 (BP Location: Right Arm, Patient Position: Sitting, Cuff Size: Normal)   Pulse 84   Ht 4\' 8"  (1.422 m)   Wt 178 lb 3.2 oz (80.8 kg)   LMP 07/16/2012   SpO2 98%   BMI 39.95 kg/m  Wt Readings from Last 3 Encounters:  04/15/23 178 lb 3.2 oz (80.8 kg)  04/01/23 173 lb (78.5 kg)  02/11/23 175 lb (79.4 kg)        Assessment & Plan:   Problem List Items Addressed This Visit       Musculoskeletal and Integument   Epidermoid cyst of skin of scalp    Appears to be benign cyst of scalp, reassured If change in size, shape, color or if she has symptoms, will get [place] of scalp        Genitourinary   Acute vaginitis - Primary    Her symptoms are likely due to candidal vaginitis - she is on Synjardy, could be due to glucosuria from the medicine Check vaginal swab Empiric fluconazole UA negative for LE or nitrite, less likely to be UTI       Relevant Medications   fluconazole (DIFLUCAN) 150 MG tablet   Other Relevant Orders   NuSwab Vaginitis (VG)   Other Visit Diagnoses     Dysuria       Relevant Orders   POCT URINALYSIS DIP (CLINITEK) (Completed)        Meds ordered this encounter  Medications   fluconazole (DIFLUCAN) 150 MG tablet    Sig: Take 1 tablet (150 mg total) by mouth once for 1 dose.    Dispense:  1 tablet    Refill:  0     Griselda Bramblett Concha Se, MD

## 2023-04-15 NOTE — Assessment & Plan Note (Signed)
Appears to be benign cyst of scalp, reassured If change in size, shape, color or if she has symptoms, will get [place] of scalp

## 2023-04-15 NOTE — Patient Instructions (Signed)
Please take Fluconazole as prescribed.

## 2023-04-15 NOTE — Assessment & Plan Note (Signed)
Her symptoms are likely due to candidal vaginitis - she is on Synjardy, could be due to glucosuria from the medicine Check vaginal swab Empiric fluconazole UA negative for LE or nitrite, less likely to be UTI

## 2023-04-17 ENCOUNTER — Other Ambulatory Visit (HOSPITAL_COMMUNITY): Payer: Self-pay

## 2023-04-17 ENCOUNTER — Telehealth: Payer: Self-pay | Admitting: Internal Medicine

## 2023-04-17 ENCOUNTER — Other Ambulatory Visit: Payer: Self-pay | Admitting: Internal Medicine

## 2023-04-17 NOTE — Telephone Encounter (Signed)
Spoke to patient

## 2023-04-17 NOTE — Telephone Encounter (Signed)
Pt called in regards to labs 

## 2023-04-18 ENCOUNTER — Other Ambulatory Visit: Payer: Self-pay

## 2023-04-18 ENCOUNTER — Other Ambulatory Visit (HOSPITAL_COMMUNITY): Payer: Self-pay

## 2023-04-18 LAB — NUSWAB VAGINITIS (VG): Trich vag by NAA: NEGATIVE

## 2023-04-18 MED ORDER — GABAPENTIN 600 MG PO TABS
600.0000 mg | ORAL_TABLET | Freq: Three times a day (TID) | ORAL | 2 refills | Status: DC
  Filled 2023-04-18: qty 90, 30d supply, fill #0
  Filled 2023-05-14: qty 90, 30d supply, fill #1
  Filled 2023-05-22 – 2023-06-13 (×3): qty 90, 30d supply, fill #2

## 2023-04-19 ENCOUNTER — Ambulatory Visit (INDEPENDENT_AMBULATORY_CARE_PROVIDER_SITE_OTHER): Payer: 59 | Admitting: Physical Medicine and Rehabilitation

## 2023-04-19 ENCOUNTER — Telehealth: Payer: Self-pay | Admitting: Orthopedic Surgery

## 2023-04-19 DIAGNOSIS — R202 Paresthesia of skin: Secondary | ICD-10-CM | POA: Diagnosis not present

## 2023-04-19 DIAGNOSIS — M542 Cervicalgia: Secondary | ICD-10-CM

## 2023-04-19 DIAGNOSIS — M79641 Pain in right hand: Secondary | ICD-10-CM | POA: Diagnosis not present

## 2023-04-19 DIAGNOSIS — M25521 Pain in right elbow: Secondary | ICD-10-CM | POA: Diagnosis not present

## 2023-04-19 LAB — NUSWAB VAGINITIS (VG)

## 2023-04-19 NOTE — Progress Notes (Unsigned)
Functional Pain Scale - descriptive words and definitions  Unmanageable (7)  Pain interferes with normal ADL's/nothing seems to help/sleep is very difficult/active distractions are very difficult to concentrate on. Severe range order  Average Pain  varies  Right handed. Right hand pain and numbness. Numbness in all fingers, but worse in middle finger and it is hard to bend. Pain can radiate up to the right elbow

## 2023-04-19 NOTE — Telephone Encounter (Signed)
I called her to advise to you [facility]

## 2023-04-19 NOTE — Telephone Encounter (Signed)
She should have refills on the Meloxicam and Gabapentin That's what he gives for the hand I will call her when not in clinic.

## 2023-04-19 NOTE — Telephone Encounter (Signed)
DR HARRISON   Patient called and left voicemail stating she has done her nerve study today and she is needing something for the pain.    She is leaving tomorrow and going out of town for a week.   Pharmacy:  [facility] on [address].    Please call hr back and advise  [phone]

## 2023-04-20 LAB — NUSWAB VAGINITIS (VG): Candida glabrata, NAA: POSITIVE — AB

## 2023-04-23 NOTE — Procedures (Unsigned)
EMG & NCV Findings: Evaluation of the right median motor nerve showed prolonged distal onset latency (15.7 ms), reduced amplitude (0.3 mV), and decreased conduction velocity (Elbow-Wrist, 35 m/s).  The right median (across palm) sensory nerve showed prolonged distal peak latency (Wrist, 7.8 ms), reduced amplitude (6.2 V), and prolonged distal peak latency (Palm, 6.6 ms).  The right ulnar sensory nerve showed decreased conduction velocity (Wrist-5th Digit, 36 m/s).  All remaining nerves (as indicated in the following tables) were within normal limits.    Needle evaluation of the right abductor pollicis brevis muscle showed increased insertional activity, moderately increased spontaneous activity, and diminished recruitment.  All remaining muscles (as indicated in the following table) showed no evidence of electrical instability.    Impression: The above electrodiagnostic study is ABNORMAL and reveals evidence of a severe right median nerve entrapment at the wrist (carpal tunnel syndrome) affecting sensory and motor components. The lesion is characterized by sensory and motor demyelination with evidence of significant axonal injury.   There is no significant electrodiagnostic evidence of any other focal nerve entrapment, brachial plexopathy or cervical radiculopathy.   Recommendations: 1.  Follow-up with referring physician. 2.  Continue current management of symptoms. 3.  Suggest surgical evaluation.  ___________________________ Naaman Plummer FAAPMR Board Certified, American Board of Physical Medicine and Rehabilitation    Nerve Conduction Studies Anti Sensory Summary Table   Stim Site NR Peak (ms) Norm Peak (ms) P-T Amp (V) Norm P-T Amp Site1 Site2 Delta-P (ms) Dist (cm) Vel (m/s) Norm Vel (m/s)  Right Median Acr Palm Anti Sensory (2nd Digit)  30.8C  Wrist    *7.8 <3.6 *6.2 >10 Wrist Palm 1.2 0.0    Palm    *6.6 <2.0 1.0         Right Radial Anti Sensory (Base 1st Digit)  30.7C  Wrist     2.2 <3.1 29.5  Wrist Base 1st Digit 2.2 0.0    Right Ulnar Anti Sensory (5th Digit)  30.9C  Wrist    3.9 <3.9 22.3 >15.0 Wrist 5th Digit 3.9 14.0 *36 >38   Motor Summary Table   Stim Site NR Onset (ms) Norm Onset (ms) O-P Amp (mV) Norm O-P Amp Site1 Site2 Delta-0 (ms) Dist (cm) Vel (m/s) Norm Vel (m/s)  Right Median Motor (Abd Poll Brev)  30.8C  Wrist    *15.7 <4.2 *0.3 >5 Elbow Wrist 5.6 19.5 *35 >50  Elbow    21.3  0.4         Right Ulnar Motor (Abd Dig Min)  30.8C  Wrist    3.8 <4.2 8.6 >3 B Elbow Wrist 3.4 19.0 56 >53  B Elbow    7.2  8.5  A Elbow B Elbow 1.2 10.0 83 >53  A Elbow    8.4  8.5          EMG   Side Muscle Nerve Root Ins Act Fibs Psw Amp Dur Poly Recrt Int Dennie Bible Comment  Right Abd Poll Brev Median C8-T1 *Incr *2+ *2+ Nml Nml 0 *Reduced Nml   Right 1stDorInt Ulnar C8-T1 Nml Nml Nml Nml Nml 0 Nml Nml   Right PronatorTeres Median C6-7 Nml Nml Nml Nml Nml 0 Nml Nml   Right Biceps Musculocut C5-6 Nml Nml Nml Nml Nml 0 Nml Nml   Right Deltoid Axillary C5-6 Nml Nml Nml Nml Nml 0 Nml Nml     Nerve Conduction Studies Anti Sensory Left/Right Comparison   Stim Site L Lat (ms) R Lat (ms) L-R Lat (ms)  L Amp (V) R Amp (V) L-R Amp (%) Site1 Site2 L Vel (m/s) R Vel (m/s) L-R Vel (m/s)  Median Acr Palm Anti Sensory (2nd Digit)  30.8C  Wrist  *7.8   *6.2  Wrist Palm     Palm  *6.6   1.0        Radial Anti Sensory (Base 1st Digit)  30.7C  Wrist  2.2   29.5  Wrist Base 1st Digit     Ulnar Anti Sensory (5th Digit)  30.9C  Wrist  3.9   22.3  Wrist 5th Digit  *36    Motor Left/Right Comparison   Stim Site L Lat (ms) R Lat (ms) L-R Lat (ms) L Amp (mV) R Amp (mV) L-R Amp (%) Site1 Site2 L Vel (m/s) R Vel (m/s) L-R Vel (m/s)  Median Motor (Abd Poll Brev)  30.8C  Wrist  *15.7   *0.3  Elbow Wrist  *35   Elbow  21.3   0.4        Ulnar Motor (Abd Dig Min)  30.8C  Wrist  3.8   8.6  B Elbow Wrist  56   B Elbow  7.2   8.5  A Elbow B Elbow  83   A Elbow  8.4   8.5            Waveforms:

## 2023-04-24 ENCOUNTER — Encounter: Payer: Self-pay | Admitting: Physical Medicine and Rehabilitation

## 2023-04-24 NOTE — Progress Notes (Signed)
[name] - 57 y.o. female MRN [id number]  Date of birth: Jul 16, 1966  Office Visit Note: Visit Date: 04/19/2023 PCP: Anabel Halon, MD Referred by: Vickki Hearing, MD  Subjective: Chief Complaint  Patient presents with   Right Hand - Pain, Numbness   HPI: [name] is a 57 y.o. female who comes in today at the request of Dr. Fuller Canada for evaluation and management of chronic, worsening and severe cervical pain with numbness and tingling in the Right upper extremities.  Patient is Right hand dominant.  She presents as a somewhat complicated case with history of diabetes type 2 with history of insulin pump with last hemoglobin A1c 6.2 range.  She complains of neck pain and arm pain with numbness and tingling but mainly global numbness and tingling in the whole hand with the middle digit worse.  Per Dr. [name] notes he felt like it was more median nerve distribution.  This is been ongoing for several months or at least worsening for several months.  It is fairly constant it can radiate up to the elbow.  She has difficulty bending her hand.  She denies any left-sided complaints.  She also has a history of lumbar surgery with fusion by Dr. Johnsie Cancel.  No history of cervical surgery.  Recent cervical MRI shows some spondylitic changes C6-7 but no high-grade stenosis.  She has noted some weakness in the hand.  Interestingly she had prior electrodiagnostic study by Dr. Levert Feinstein at [facility].  This was of the lower extremities and did not show any peripheral polyneuropathy.  Has tried anti-inflammatories as well as stretching and bracing without relief.   I spent more than 30 minutes speaking face-to-face with the patient with 50% of the time in counseling and discussing coordination of care.     Review of Systems  Musculoskeletal:  Positive for back pain, joint pain and neck pain.  Neurological:  Positive for tingling and weakness.  All other systems  reviewed and are negative.  Otherwise per HPI.  Assessment & Plan: Visit Diagnoses:    ICD-10-CM   1. Paresthesia of skin  R20.2 NCV with EMG (electromyography)    2. Pain in right hand  M79.641     3. Pain in right elbow  M25.521     4. Cervicalgia  M54.2        Plan: Impression: Clinical scenario seems to be more consistent with a median nerve neuropathy at the wrist versus double crush phenomenon with C6 radiculitis radiculopathy.  Cervical MRI reviewed.  Complicated by type 2 diabetes but with prior nerve conduction study of the lower limbs showing no polyneuropathy.  Electrodiagnostic study done today.  The above electrodiagnostic study is ABNORMAL and reveals evidence of a severe right median nerve entrapment at the wrist (carpal tunnel syndrome) affecting sensory and motor components. The lesion is characterized by sensory and motor demyelination with evidence of significant axonal injury.  Despite decompression may have some continued symptoms.  There is no significant electrodiagnostic evidence of any other focal nerve entrapment, brachial plexopathy or cervical radiculopathy.   Recommendations: 1.  Follow-up with referring physician. 2.  Continue current management of symptoms. 3.  Suggest surgical evaluation.  Meds & Orders: No orders of the defined types were placed in this encounter.   Orders Placed This Encounter  Procedures   NCV with EMG (electromyography)    Follow-up: Return for Fuller Canada, MD.   Procedures: No procedures performed  EMG & NCV  Findings: Evaluation of the right median motor nerve showed prolonged distal onset latency (15.7 ms), reduced amplitude (0.3 mV), and decreased conduction velocity (Elbow-Wrist, 35 m/s).  The right median (across palm) sensory nerve showed prolonged distal peak latency (Wrist, 7.8 ms), reduced amplitude (6.2 V), and prolonged distal peak latency (Palm, 6.6 ms).  The right ulnar sensory nerve showed decreased  conduction velocity (Wrist-5th Digit, 36 m/s).  All remaining nerves (as indicated in the following tables) were within normal limits.    Needle evaluation of the right abductor pollicis brevis muscle showed increased insertional activity, moderately increased spontaneous activity, and diminished recruitment.  All remaining muscles (as indicated in the following table) showed no evidence of electrical instability.    Impression: The above electrodiagnostic study is ABNORMAL and reveals evidence of a severe right median nerve entrapment at the wrist (carpal tunnel syndrome) affecting sensory and motor components. The lesion is characterized by sensory and motor demyelination with evidence of significant axonal injury.  Despite decompression may have some continued symptoms.  There is no significant electrodiagnostic evidence of any other focal nerve entrapment, brachial plexopathy or cervical radiculopathy.   Recommendations: 1.  Follow-up with referring physician. 2.  Continue current management of symptoms. 3.  Suggest surgical evaluation.  ___________________________ [name] FAAPMR Board Certified, American Board of Physical Medicine and Rehabilitation    Nerve Conduction Studies Anti Sensory Summary Table   Stim Site NR Peak (ms) Norm Peak (ms) P-T Amp (V) Norm P-T Amp Site1 Site2 Delta-P (ms) Dist (cm) Vel (m/s) Norm Vel (m/s)  Right Median Acr Palm Anti Sensory (2nd Digit)  30.8C  Wrist    *7.8 <3.6 *6.2 >10 Wrist Palm 1.2 0.0    Palm    *6.6 <2.0 1.0         Right Radial Anti Sensory (Base 1st Digit)  30.7C  Wrist    2.2 <3.1 29.5  Wrist Base 1st Digit 2.2 0.0    Right Ulnar Anti Sensory (5th Digit)  30.9C  Wrist    3.9 <3.9 22.3 >15.0 Wrist 5th Digit 3.9 14.0 *36 >38   Motor Summary Table   Stim Site NR Onset (ms) Norm Onset (ms) O-P Amp (mV) Norm O-P Amp Site1 Site2 Delta-0 (ms) Dist (cm) Vel (m/s) Norm Vel (m/s)  Right Median Motor (Abd Poll Brev)  30.8C  Wrist     *15.7 <4.2 *0.3 >5 Elbow Wrist 5.6 19.5 *35 >50  Elbow    21.3  0.4         Right Ulnar Motor (Abd Dig Min)  30.8C  Wrist    3.8 <4.2 8.6 >3 B Elbow Wrist 3.4 19.0 56 >53  B Elbow    7.2  8.5  A Elbow B Elbow 1.2 10.0 83 >53  A Elbow    8.4  8.5          EMG   Side Muscle Nerve Root Ins Act Fibs Psw Amp Dur Poly Recrt Int Dennie Bible Comment  Right Abd Poll Brev Median C8-T1 *Incr *2+ *2+ Nml Nml 0 *Reduced Nml   Right 1stDorInt Ulnar C8-T1 Nml Nml Nml Nml Nml 0 Nml Nml   Right PronatorTeres Median C6-7 Nml Nml Nml Nml Nml 0 Nml Nml   Right Biceps Musculocut C5-6 Nml Nml Nml Nml Nml 0 Nml Nml   Right Deltoid Axillary C5-6 Nml Nml Nml Nml Nml 0 Nml Nml     Nerve Conduction Studies Anti Sensory Left/Right Comparison   Stim Site L Lat (ms) R  Lat (ms) L-R Lat (ms) L Amp (V) R Amp (V) L-R Amp (%) Site1 Site2 L Vel (m/s) R Vel (m/s) L-R Vel (m/s)  Median Acr Palm Anti Sensory (2nd Digit)  30.8C  Wrist  *7.8   *6.2  Wrist Palm     Palm  *6.6   1.0        Radial Anti Sensory (Base 1st Digit)  30.7C  Wrist  2.2   29.5  Wrist Base 1st Digit     Ulnar Anti Sensory (5th Digit)  30.9C  Wrist  3.9   22.3  Wrist 5th Digit  *36    Motor Left/Right Comparison   Stim Site L Lat (ms) R Lat (ms) L-R Lat (ms) L Amp (mV) R Amp (mV) L-R Amp (%) Site1 Site2 L Vel (m/s) R Vel (m/s) L-R Vel (m/s)  Median Motor (Abd Poll Brev)  30.8C  Wrist  *15.7   *0.3  Elbow Wrist  *35   Elbow  21.3   0.4        Ulnar Motor (Abd Dig Min)  30.8C  Wrist  3.8   8.6  B Elbow Wrist  56   B Elbow  7.2   8.5  A Elbow B Elbow  83   A Elbow  8.4   8.5           Waveforms:             Clinical History: MRI CERVICAL SPINE WITHOUT CONTRAST   TECHNIQUE: Multiplanar, multisequence MR imaging of the cervical spine was performed. No intravenous contrast was administered.   COMPARISON:  Cervical spine radiographs 01/18/2015. Cervical MRI 12/30/2012.   FINDINGS: Alignment: Minimal degenerative anterolisthesis at  C5-6 and C6-7. No focal angulation.   Vertebrae: No acute or suspicious osseous findings.   Cord: Normal in signal and caliber.   Posterior Fossa, vertebral arteries, paraspinal tissues: Visualized portions of the posterior fossa appear unremarkable.Bilateral vertebral artery flow voids. No significant paraspinal findings.   Disc levels:   C2-3: The disc appears normal. Mild right-sided facet hypertrophy. The spinal canal and neural foramina are widely patent.   C3-4: The disc appears normal. Mild right-sided facet hypertrophy contributing to mild right foraminal narrowing. The spinal canal and left foramen are widely patent.   C4-5: Mild disc bulging and mild right-sided facet hypertrophy. No spinal stenosis or nerve root encroachment.   C5-6: Mild loss of disc height with mild disc bulging and uncinate spurring bilaterally. Mild bilateral facet hypertrophy. Minimal left foraminal narrowing. No spinal stenosis or nerve root encroachment.   C6-7: Spondylosis at this level has progressed from the prior examination. There is loss of disc height with disc bulging and uncinate spurring asymmetric to the right. Resulting partial effacement of the CSF surrounding the cord without cord deformity. Mild to moderate right and mild left foraminal narrowing.   C7-T1: Minimal facet hypertrophy. No spinal stenosis or nerve root encroachment.   IMPRESSION: 1. Progressive spondylosis at C6-7 compared with previous MRI from 2014. There is partial effacement of the CSF surrounding the cord without cord deformity. Mild to moderate right and mild left foraminal narrowing. 2. No other significant spinal stenosis or nerve root encroachment. 3. No acute findings.     Electronically Signed   By: Carey Bullocks M.D.   On: 02/19/2023 09:03 --------  2019 Lower extremity EMG/NCS no peripheral polyneuropathy   She reports that she has never smoked. She has never used smokeless tobacco.   Recent Labs  06/07/22 1545 09/25/22 1449  HGBA1C 6.5 6.2*    Objective:  VS:  HT:    WT:   BMI:     BP:   HR: bpm  TEMP: ( )  RESP:  Physical Exam Vitals and nursing note reviewed.  Constitutional:      General: She is not in acute distress.    Appearance: Normal appearance. She is well-developed. She is obese. She is not ill-appearing.  HENT:     Head: Normocephalic and atraumatic.  Eyes:     Conjunctiva/sclera: Conjunctivae normal.     Pupils: Pupils are equal, round, and reactive to light.  Cardiovascular:     Rate and Rhythm: Normal rate.     Pulses: Normal pulses.  Pulmonary:     Effort: Pulmonary effort is normal.  Musculoskeletal:        General: No swelling, tenderness or deformity.     Right lower leg: No edema.     Left lower leg: No edema.     Comments: Inspection reveals flattening of the right APB but no atrophy of the bilateral APB or FDI or hand intrinsics. There is no swelling, color changes, allodynia or dystrophic changes. There is 5 out of 5 strength in the bilateral wrist extension, finger abduction and long finger flexion. There is impaired sensation to light touch in the right median nerve distribution..  There is a negative Tinel's test at the bilateral wrist and elbow. There is a positive Phalen's test on the right. There is a negative Hoffmann's test bilaterally.  Skin:    General: Skin is warm and dry.     Findings: No erythema or rash.  Neurological:     General: No focal deficit present.     Mental Status: She is alert and oriented to person, place, and time.     Sensory: No sensory deficit.     Motor: No weakness or abnormal muscle tone.     Coordination: Coordination normal.     Gait: Gait normal.  Psychiatric:        Mood and Affect: Mood normal.        Behavior: Behavior normal.     Ortho Exam  Imaging: No results found.  Past Medical/Family/Surgical/Social History: Medications & Allergies reviewed per EMR, new medications  updated. Patient Active Problem List   Diagnosis Date Noted   Epidermoid cyst of skin of scalp 04/15/2023   Acute vaginitis 04/15/2023   Cervical spinal stenosis 01/30/2023   Nodule of finger of right hand 01/22/2023   Acute idiopathic gout 01/01/2023   Fecal incontinence 01/01/2023   Vertigo 08/09/2022   Chronic mucoid otitis media of left ear 05/16/2022   Primary insomnia 05/09/2022   ASCUS of cervix with negative high risk HPV 01/16/2022   Spondylolisthesis of lumbar region 12/05/2021   Spinal stenosis of lumbar region with neurogenic claudication 10/25/2021   Chronic bilateral low back pain with left-sided sciatica 10/18/2021   Constipation 01/18/2021   GERD (gastroesophageal reflux disease) 03/09/2020   Other intervertebral disc degeneration, lumbar region 05/24/2019   OSA (obstructive sleep apnea) 12/08/2014   Depression 07/15/2013   Dyspnea 11/12/2012   Headache(784.0) 10/14/2012   Neuropathy 10/14/2012   Essential hypertension, benign 05/10/2012   Diabetes mellitus (HCC) 09/29/2008   Mixed hyperlipidemia 09/29/2008   Class 2 severe obesity due to excess calories with serious comorbidity and body mass index (BMI) of 38.0 to 38.9 in adult [facility]) 08/31/2008   Recurrent nephrolithiasis 08/31/2008   Past Medical History:  Diagnosis Date  Diabetes mellitus    insulin pump 2013   Dysphagia 03/09/2020   Epidural hematoma (HCC) 12/14/2021   GERD (gastroesophageal reflux disease)    History of kidney stones    Hypercholesteremia    Hypertension    Incarcerated umbilical hernia 03/25/2014   Kidney stone    Kidney stones 08/31/2008   Qualifier: Diagnosis of  By: Erby Pian MD, Cornelius     Neuropathy    Obesity    Palpitations    Shortness of breath    Vertigo    Family History  Problem Relation Age of Onset   Depression Paternal Grandfather    Depression Paternal Grandmother    Diabetes Father    Hypertension Mother    Cancer Mother    Diabetes Mother    Stroke  Mother    Alzheimer's disease Mother    Cancer Paternal Uncle    Heart disease Other    Cancer Other    Diabetes Other    Achalasia Other    Anesthesia problems Neg Hx    Hypotension Neg Hx    Malignant hyperthermia Neg Hx    Pseudochol deficiency Neg Hx    Colon cancer Neg Hx    Colon polyps Neg Hx    Past Surgical History:  Procedure Laterality Date   BIOPSY  06/23/2020   Procedure: BIOPSY;  Surgeon: Corbin Ade, MD;  [facility]: [facility];  Service: Endoscopy;;   CARDIAC CATHETERIZATION  12/11/2012   normal coronary arteries   CESAREAN SECTION  1994;2000   [place]   CHOLECYSTECTOMY  1992   COLONOSCOPY WITH PROPOFOL N/A 06/23/2020   large amount of stool in entire colon, prep inadequate.    CYSTOSCOPY W/ URETERAL STENT PLACEMENT  05/08/2012   Procedure: CYSTOSCOPY WITH RETROGRADE PYELOGRAM/URETERAL STENT PLACEMENT;  Surgeon: Ky Barban, MD;  Location: [facility];  Service: Urology;  Laterality: Right;   CYSTOSCOPY WITH RETROGRADE PYELOGRAM, URETEROSCOPY AND STENT PLACEMENT Right 05/04/2020   Procedure: CYSTOSCOPY WITH RIGHT RETROGRADE PYELOGRAM, RIGHT URETEROSCOPY AND RIGHT URETERAL STENT EXCHANGE;  Surgeon: Malen Gauze, MD;  Location: [facility];  Service: Urology;  Laterality: Right;   CYSTOSCOPY WITH RETROGRADE PYELOGRAM, URETEROSCOPY AND STENT PLACEMENT Left 08/08/2020   Procedure: CYSTOSCOPY WITH RETROGRADE PYELOGRAM, URETEROSCOPY WITH LASER  AND STENT PLACEMENT;  Surgeon: Malen Gauze, MD;  Location: [facility];  Service: Urology;  Laterality: Left;   CYSTOSCOPY WITH RETROGRADE PYELOGRAM, URETEROSCOPY AND STENT PLACEMENT Right 11/08/2020   Procedure: CYSTOSCOPY WITH RIGHT RETROGRADE PYELOGRAM, RIGHT URETEROSCOPY AND STENT PLACEMENT;  Surgeon: Malen Gauze, MD;  Location: [facility];  Service: Urology;  Laterality: Right;   CYSTOSCOPY WITH RETROGRADE PYELOGRAM, URETEROSCOPY AND STENT PLACEMENT Left 12/27/2020   Procedure: CYSTOSCOPY WITH LEFT RETROGRADE PYELOGRAM,  LEFT URETEROSCOPY AND LEFT URETERAL STENT PLACEMENT;  Surgeon: Malen Gauze, MD;  Location: [facility];  Service: Urology;  Laterality: Left;   CYSTOSCOPY WITH RETROGRADE PYELOGRAM, URETEROSCOPY AND STENT PLACEMENT Bilateral 03/16/2021   Procedure: CYSTOSCOPY WITH BILATERAL  RETROGRADE PYELOGRAM, BILATERAL URETEROSCOPY AND STENT PLACEMENT ON THE LEFT;  Surgeon: Malen Gauze, MD;  Location: [facility];  Service: Urology;  Laterality: Bilateral;   CYSTOSCOPY WITH RETROGRADE PYELOGRAM, URETEROSCOPY AND STENT PLACEMENT Left 06/28/2022   Procedure: CYSTOSCOPY WITH RETROGRADE PYELOGRAM, URETEROSCOPY AND STENT PLACEMENT;  Surgeon: Malen Gauze, MD;  Location: [facility];  Service: Urology;  Laterality: Left;   CYSTOSCOPY WITH RETROGRADE PYELOGRAM, URETEROSCOPY AND STENT PLACEMENT Bilateral 09/27/2022   Procedure: CYSTOSCOPY WITH BILATERAL RETROGRADE PYELOGRAM, URETEROSCOPY AND STENT PLACEMENT;  Surgeon: Malen Gauze, MD;  Location: [facility];  Service: Urology;  Laterality: Bilateral;  pt knows to arrive at 6:00   CYSTOSCOPY WITH STENT PLACEMENT Right 02/25/2020   Procedure: CYSTOSCOPY WITH RIGHT RETROGRADE RIGHT STENT PLACEMENT;  Surgeon: Jerilee Field, MD;  Location: [facility];  Service: Urology;  Laterality: Right;   ESOPHAGOGASTRODUODENOSCOPY (EGD) WITH PROPOFOL N/A 06/23/2020   Normal esophagus, multiple localized erosions were found in gastric antrum s/p biopsy, normal duodenum. Empiric dilation.Reactive gastropathy with erosions, negative H.pylori, no dysplasia.     EXTRACORPOREAL SHOCK WAVE LITHOTRIPSY Right 10/11/2020   Procedure: EXTRACORPOREAL SHOCK WAVE LITHOTRIPSY (ESWL);  Surgeon: Malen Gauze, MD;  Location: [facility];  Service: Urology;  Laterality: Right;   FOOT SURGERY Right March, 2013   [facility]-removal of bone spur   HOLMIUM LASER APPLICATION  05/04/2020   Procedure: HOLMIUM LASER LITHOTRIPSY RIGHT URETERAL CALCULUS;  Surgeon: Malen Gauze, MD;  Location: [facility];  Service: Urology;;   HYSTEROSCOPY WITH THERMACHOICE  04/25/2012   Procedure: HYSTEROSCOPY WITH THERMACHOICE;  Surgeon: Lazaro Arms, MD;  Location: [facility];  Service: Gynecology;  Laterality: N/A;  total therapy time= 11 minutes 42 seconds; 34 ml D5w  in and 34 ml D5w out; temp =87 degrees F   LEFT HEART CATHETERIZATION WITH CORONARY ANGIOGRAM N/A 12/11/2012   Procedure: LEFT HEART CATHETERIZATION WITH CORONARY ANGIOGRAM;  Surgeon: Runell Gess, MD;  Location[facility] CATH LAB;  Service: Cardiovascular;  Laterality: N/A;   LUMBAR WOUND DEBRIDEMENT N/A 12/14/2021   Procedure: LUMBAR HEMATOMA EVACUATION;  Surgeon: Jadene Pierini, MD;  Location: [facility];  Service: Neurosurgery;  Laterality: N/A;   MALONEY DILATION N/A 06/23/2020   Procedure: [name] DILATION;  Surgeon: Corbin Ade, MD;  [facility];  Service: Endoscopy;  Laterality: N/A;   Sinus sergery  1988   [place]   STONE EXTRACTION WITH BASKET Right 11/08/2020   Procedure: STONE EXTRACTION WITH BASKET;  Surgeon: Malen Gauze, MD;  Location: [facility];  Service: Urology;  Laterality: Right;   TRANSFORAMINAL LUMBAR INTERBODY FUSION (TLIF) WITH PEDICLE SCREW FIXATION 1 LEVEL N/A 12/05/2021   Procedure: Open Lumbar five-Sacral one Laminectomy/Transforaminal Lumbar Interbody Fusion/Posterolateral instrumented fusion;  Surgeon: Jadene Pierini, MD;  Location: [facility];  Service: Neurosurgery;  Laterality: N/A;  3C   UMBILICAL HERNIA REPAIR N/A 03/25/2014   Procedure: UMBILICAL HERNIA REPAIR;  Surgeon: Marlane Hatcher, MD;  Location: [facility];  Service: General;  Laterality: N/A;   uterine ablation     Social History   Occupational History   Occupation: CNA    Employer: Audiological scientist: [employer]   Occupation: Lawyer- at [employer]  Tobacco Use   Smoking status: Never   Smokeless tobacco: Never  Vaping Use   Vaping Use: Never used  Substance and Sexual Activity   Alcohol use: No   Drug use: No    Sexual activity: Yes    Partners: Male    Birth control/protection: Post-menopausal    Comment: ablation

## 2023-05-01 ENCOUNTER — Encounter: Payer: Self-pay | Admitting: Urology

## 2023-05-01 ENCOUNTER — Ambulatory Visit (INDEPENDENT_AMBULATORY_CARE_PROVIDER_SITE_OTHER): Payer: 59 | Admitting: Urology

## 2023-05-01 VITALS — BP 122/77 | HR 81

## 2023-05-01 DIAGNOSIS — N200 Calculus of kidney: Secondary | ICD-10-CM | POA: Diagnosis not present

## 2023-05-01 DIAGNOSIS — R109 Unspecified abdominal pain: Secondary | ICD-10-CM | POA: Diagnosis not present

## 2023-05-01 NOTE — Patient Instructions (Signed)

## 2023-05-01 NOTE — Progress Notes (Unsigned)
05/01/2023 3:39 PM   [name] 1966-04-18 [id number]  Referring provider: Anabel Halon, MD [address] [place],  [place] [address]   Followup nephrolithiasis and flank pain  HPI: [name] is a 57yo here for followup for nephrolithiasis and chronic left flank pain. Renal US 03/2023 showed no calculi. She has chronic left flank pain that is chronic. The pain is sharp, intermittent mild to moderate and nonraditing. No stone events since last visit.    PMH: Past Medical History:  Diagnosis Date   Diabetes mellitus    insulin pump 2013   Dysphagia 03/09/2020   Epidural hematoma (HCC) 12/14/2021   GERD (gastroesophageal reflux disease)    History of kidney stones    Hypercholesteremia    Hypertension    Incarcerated umbilical hernia 03/25/2014   Kidney stone    Kidney stones 08/31/2008   Qualifier: Diagnosis of  By: Erby Pian MD, Cornelius     Neuropathy    Obesity    Palpitations    Shortness of breath    Vertigo     Surgical History: Past Surgical History:  Procedure Laterality Date   BIOPSY  06/23/2020   Procedure: BIOPSY;  Surgeon: Corbin Ade, MD;  [facility];  Service: Endoscopy;;   CARDIAC CATHETERIZATION  12/11/2012   normal coronary arteries   CESAREAN SECTION  1994;2000   [facility]   CHOLECYSTECTOMY  1992   COLONOSCOPY WITH PROPOFOL N/A 06/23/2020   large amount of stool in entire colon, prep inadequate.    CYSTOSCOPY W/ URETERAL STENT PLACEMENT  05/08/2012   Procedure: CYSTOSCOPY WITH RETROGRADE PYELOGRAM/URETERAL STENT PLACEMENT;  Surgeon: Ky Barban, MD;  Location: [facility];  Service: Urology;  Laterality: Right;   CYSTOSCOPY WITH RETROGRADE PYELOGRAM, URETEROSCOPY AND STENT PLACEMENT Right 05/04/2020   Procedure: CYSTOSCOPY WITH RIGHT RETROGRADE PYELOGRAM, RIGHT URETEROSCOPY AND RIGHT URETERAL STENT EXCHANGE;  Surgeon: Malen Gauze, MD;  Location: [facility];  Service: Urology;  Laterality: Right;   CYSTOSCOPY WITH RETROGRADE  PYELOGRAM, URETEROSCOPY AND STENT PLACEMENT Left 08/08/2020   Procedure: CYSTOSCOPY WITH RETROGRADE PYELOGRAM, URETEROSCOPY WITH LASER  AND STENT PLACEMENT;  Surgeon: Malen Gauze, MD;  Location: [facility];  Service: Urology;  Laterality: Left;   CYSTOSCOPY WITH RETROGRADE PYELOGRAM, URETEROSCOPY AND STENT PLACEMENT Right 11/08/2020   Procedure: CYSTOSCOPY WITH RIGHT RETROGRADE PYELOGRAM, RIGHT URETEROSCOPY AND STENT PLACEMENT;  Surgeon: Malen Gauze, MD;  Location: [facility];  Service: Urology;  Laterality: Right;   CYSTOSCOPY WITH RETROGRADE PYELOGRAM, URETEROSCOPY AND STENT PLACEMENT Left 12/27/2020   Procedure: CYSTOSCOPY WITH LEFT RETROGRADE PYELOGRAM, LEFT URETEROSCOPY AND LEFT URETERAL STENT PLACEMENT;  Surgeon: Malen Gauze, MD;  Location: [facility];  Service: Urology;  Laterality: Left;   CYSTOSCOPY WITH RETROGRADE PYELOGRAM, URETEROSCOPY AND STENT PLACEMENT Bilateral 03/16/2021   Procedure: CYSTOSCOPY WITH BILATERAL  RETROGRADE PYELOGRAM, BILATERAL URETEROSCOPY AND STENT PLACEMENT ON THE LEFT;  Surgeon: Malen Gauze, MD;  [facility]: [facility];  Service: Urology;  Laterality: Bilateral;   CYSTOSCOPY WITH RETROGRADE PYELOGRAM, URETEROSCOPY AND STENT PLACEMENT Left 06/28/2022   Procedure: CYSTOSCOPY WITH RETROGRADE PYELOGRAM, URETEROSCOPY AND STENT PLACEMENT;  Surgeon: Malen Gauze, MD;  [facility]: [facility];  Service: Urology;  Laterality: Left;   CYSTOSCOPY WITH RETROGRADE PYELOGRAM, URETEROSCOPY AND STENT PLACEMENT Bilateral 09/27/2022   Procedure: CYSTOSCOPY WITH BILATERAL RETROGRADE PYELOGRAM, URETEROSCOPY AND STENT PLACEMENT;  Surgeon: Malen Gauze, MD;  Location: [facility];  Service: Urology;  Laterality: Bilateral;  pt knows to arrive at 6:00   CYSTOSCOPY  WITH STENT PLACEMENT Right 02/25/2020   Procedure: CYSTOSCOPY WITH RIGHT RETROGRADE RIGHT STENT PLACEMENT;  Surgeon: Jerilee Field, MD;  Location: [facility];  Service: Urology;  Laterality: Right;    ESOPHAGOGASTRODUODENOSCOPY (EGD) WITH PROPOFOL N/A 06/23/2020   Normal esophagus, multiple localized erosions were found in gastric antrum s/p biopsy, normal duodenum. Empiric dilation.Reactive gastropathy with erosions, negative H.pylori, no dysplasia.     EXTRACORPOREAL SHOCK WAVE LITHOTRIPSY Right 10/11/2020   Procedure: EXTRACORPOREAL SHOCK WAVE LITHOTRIPSY (ESWL);  Surgeon: Malen Gauze, MD;  Location: [facility];  Service: Urology;  Laterality: Right;   FOOT SURGERY Right March, 2013   [facility]-removal of bone spur   HOLMIUM LASER APPLICATION  05/04/2020   Procedure: HOLMIUM LASER LITHOTRIPSY RIGHT URETERAL CALCULUS;  Surgeon: Malen Gauze, MD;  Location: [facility];  Service: Urology;;   HYSTEROSCOPY WITH THERMACHOICE  04/25/2012   Procedure: HYSTEROSCOPY WITH THERMACHOICE;  Surgeon: Lazaro Arms, MD;  Location: [facility];  Service: Gynecology;  Laterality: N/A;  total therapy time= 11 minutes 42 seconds; 34 ml D5w  in and 34 ml D5w out; temp =87 degrees F   LEFT HEART CATHETERIZATION WITH CORONARY ANGIOGRAM N/A 12/11/2012   Procedure: LEFT HEART CATHETERIZATION WITH CORONARY ANGIOGRAM;  Surgeon: Runell Gess, MD;  Location: [facility] CATH LAB;  Service: Cardiovascular;  Laterality: N/A;   LUMBAR WOUND DEBRIDEMENT N/A 12/14/2021   Procedure: LUMBAR HEMATOMA EVACUATION;  Surgeon: Jadene Pierini, MD;  Location: [facility];  Service: Neurosurgery;  Laterality: N/A;   MALONEY DILATION N/A 06/23/2020   Procedure: [name] DILATION;  Surgeon: Corbin Ade, MD;  [facility];  Service: Endoscopy;  Laterality: N/A;   Sinus sergery  1988   [facility]   STONE EXTRACTION WITH BASKET Right 11/08/2020   Procedure: STONE EXTRACTION WITH BASKET;  Surgeon: Malen Gauze, MD;  Location: [facility];  Service: Urology;  Laterality: Right;   TRANSFORAMINAL LUMBAR INTERBODY FUSION (TLIF) WITH PEDICLE SCREW FIXATION 1 LEVEL N/A 12/05/2021   Procedure: Open Lumbar five-Sacral one  Laminectomy/Transforaminal Lumbar Interbody Fusion/Posterolateral instrumented fusion;  Surgeon: Jadene Pierini, MD;  Location: [facility];  Service: Neurosurgery;  Laterality: N/A;  3C   UMBILICAL HERNIA REPAIR N/A 03/25/2014   Procedure: UMBILICAL HERNIA REPAIR;  Surgeon: Marlane Hatcher, MD;  Location: [facility];  Service: General;  Laterality: N/A;   uterine ablation      Home Medications:  Allergies as of 05/01/2023       Reactions   Ciprofloxacin Hives, Swelling   Penicillins Anaphylaxis, Rash   Codeine Other (See Comments)   had hallicunations   Morphine Nausea And Vomiting, Other (See Comments)   recieved in ED due to [facility] and had multple doses - gave visual hallucinations and vomitting.   Sulfonamide Derivatives Other (See Comments)   REACTION: Unsure - childhood allergy   Vancomycin Itching        Medication List        Accurate as of May 01, 2023  3:39 PM. If you have any questions, ask your nurse or doctor.          albuterol 108 (90 Base) MCG/ACT inhaler Commonly known as: VENTOLIN HFA Inhale 2 puffs into the lungs every 6 (six) hours as needed for wheezing or shortness of breath.   blood glucose meter kit and supplies Use up to four times daily as directed.   cyclobenzaprine 10 MG tablet Commonly known as: FLEXERIL Take 1 tablet (10 mg total) by mouth 3 (three) times daily  as needed for muscle spasms.   freestyle lancets Use to test 4 times daily   furosemide 20 MG tablet Commonly known as: LASIX Take 20 mg by mouth 2 (two) times daily as needed for edema.   gabapentin 600 MG tablet Commonly known as: NEURONTIN Take 1 tablet (600 mg total) by mouth 3 (three) times daily.   glucose blood test strip Test up to 4 times a day   hydrOXYzine 50 MG capsule Commonly known as: VISTARIL Take 1 capsule (50 mg total) by mouth daily as needed for anxiety (Insomnia).   lisinopril-hydrochlorothiazide 20-12.5 MG tablet Commonly known as:  ZESTORETIC Take 1 tablet by mouth daily.   meclizine 25 MG tablet Commonly known as: ANTIVERT TAKE 1 TABLET(25 MG) BY MOUTH THREE TIMES DAILY AS NEEDED FOR DIZZINESS OR NAUSEA   meloxicam 7.5 MG tablet Commonly known as: Mobic Take 1 tablet (7.5 mg total) by mouth daily.   omeprazole 20 MG capsule Commonly known as: PRILOSEC Take 1 capsule (20 mg total) by mouth daily.   ondansetron 4 MG tablet Commonly known as: Zofran Take 1 tablet (4 mg total) by mouth daily as needed for nausea or vomiting.   oxyCODONE-acetaminophen 5-325 MG tablet Commonly known as: PERCOCET/ROXICET Take 1 tablet by mouth every 8 (eight) hours as needed for severe pain.   Potassium Citrate 15 MEQ (1620 MG) Tbcr Commonly known as: Urocit-K 15 Take 1 tablet by mouth in the morning and at bedtime.   promethazine 25 MG tablet Commonly known as: PHENERGAN TAKE 1 TABLET(25 MG) BY MOUTH EVERY 8 HOURS AS NEEDED FOR NAUSEA OR VOMITING   simvastatin 40 MG tablet Commonly known as: ZOCOR Take 1 tablet (40 mg total) by mouth every evening for cholesterol   Synjardy 12.5-500 MG Tabs Generic drug: Empagliflozin-metFORMIN HCl Take 1 tablet by mouth daily.   Trulicity 3 MG/0.5ML Sopn Generic drug: Dulaglutide Inject 3 mg into the skin once a week.   zolpidem 5 MG tablet Commonly known as: AMBIEN Take 1 tablet (5 mg total) by mouth at bedtime as needed for sleep.        Allergies:  Allergies  Allergen Reactions   Ciprofloxacin Hives and Swelling   Penicillins Anaphylaxis and Rash   Codeine Other (See Comments)    had hallicunations   Morphine Nausea And Vomiting and Other (See Comments)    recieved in ED due to [facility] and had multple doses - gave visual hallucinations and vomitting.   Sulfonamide Derivatives Other (See Comments)    REACTION: Unsure - childhood allergy   Vancomycin Itching    Family History: Family History  Problem Relation Age of Onset   Depression Paternal Grandfather     Depression Paternal Grandmother    Diabetes Father    Hypertension Mother    Cancer Mother    Diabetes Mother    Stroke Mother    Alzheimer's disease Mother    Cancer Paternal Uncle    Heart disease Other    Cancer Other    Diabetes Other    Achalasia Other    Anesthesia problems Neg Hx    Hypotension Neg Hx    Malignant hyperthermia Neg Hx    Pseudochol deficiency Neg Hx    Colon cancer Neg Hx    Colon polyps Neg Hx     Social History:  reports that she has never smoked. She has never used smokeless tobacco. She reports that she does not drink alcohol and does not use drugs.  ROS: All other  review of systems were reviewed and are negative except what is noted above in HPI  Physical Exam: BP 122/77   Pulse 81   LMP 07/16/2012   Constitutional:  Alert and oriented, No acute distress. HEENT: [place] AT, moist mucus membranes.  Trachea midline, no masses. Cardiovascular: No clubbing, cyanosis, or edema. Respiratory: Normal respiratory effort, no increased work of breathing. GI: Abdomen is soft, nontender, nondistended, no abdominal masses GU: No CVA tenderness.  Lymph: No cervical or inguinal lymphadenopathy. Skin: No rashes, bruises or suspicious lesions. Neurologic: Grossly intact, no focal deficits, moving all 4 extremities. Psychiatric: Normal mood and affect.  Laboratory Data: Lab Results  Component Value Date   WBC 7.8 07/02/2022   HGB 14.2 07/02/2022   HCT 43.9 07/02/2022   MCV 85.4 07/02/2022   PLT 302 07/02/2022    Lab Results  Component Value Date   CREATININE 0.70 02/06/2023    No results found for: "PSA"  No results found for: "TESTOSTERONE"  Lab Results  Component Value Date   HGBA1C 6.2 (H) 09/25/2022    Urinalysis    Component Value Date/Time   COLORURINE STRAW (A) 07/02/2022 0717   APPEARANCEUR Clear 12/25/2022 1607   LABSPEC 1.003 (L) 07/02/2022 0717   PHURINE 6.0 07/02/2022 0717   GLUCOSEU 3+ (A) 12/25/2022 1607   HGBUR LARGE (A)  07/02/2022 0717   HGBUR moderate 08/31/2008 0908   BILIRUBINUR negative 04/15/2023 1427   BILIRUBINUR Negative 12/25/2022 1607   KETONESUR negative 04/15/2023 1427   KETONESUR NEGATIVE 07/02/2022 0717   PROTEINUR Negative 12/25/2022 1607   PROTEINUR NEGATIVE 07/02/2022 0717   UROBILINOGEN 0.2 04/15/2023 1427   UROBILINOGEN 0.2 09/21/2014 1055   NITRITE Negative 04/15/2023 1427   NITRITE Negative 12/25/2022 1607   NITRITE NEGATIVE 07/02/2022 0717   LEUKOCYTESUR Negative 04/15/2023 1427   LEUKOCYTESUR Negative 12/25/2022 1607   LEUKOCYTESUR NEGATIVE 07/02/2022 0717    Lab Results  Component Value Date   LABMICR Comment 12/25/2022   WBCUA 6-10 (A) 01/05/2022   LABEPIT 0-10 01/05/2022   BACTERIA NONE SEEN 07/02/2022    Pertinent Imaging: Renal US 03/20/2023: Images reviewed and discussed with the patient  Results for orders placed during the hospital encounter of 05/17/22  DG Abdomen 1 View  Narrative CLINICAL DATA:  Left-sided abdominal and flank pain. History of kidney stones.  EXAM: ABDOMEN - 1 VIEW  COMPARISON:  Ultrasound 04/12/2022. CT 02/01/2021  FINDINGS: Bowel gas pattern is normal. Cholecystectomy clips in place. Lower spinal fusion. No visible urinary tract calculi.  IMPRESSION: No visible urinary tract calculi. Sensitivity for small stones would be limited given patient's size and bowel contents.   Electronically Signed By: Paulina Fusi M.D. On: 05/17/2022 08:30  No results found for this or any previous visit.  No results found for this or any previous visit.  No results found for this or any previous visit.  Results for orders placed during the hospital encounter of 03/20/23  Ultrasound renal complete  Narrative CLINICAL DATA:  Nephrolithiasis.  EXAM: RENAL / URINARY TRACT ULTRASOUND COMPLETE  COMPARISON:  CT scans of the abdomen and pelvis without contrast July 02, 2022 and August 10, 2022. Renal sound December 21, 2022.  FINDINGS: Right Kidney:  Renal measurements: 11 x 6.2 x 6.7 cm = volume: 238 mL. Echogenicity within normal limits. No mass or hydronephrosis visualized.  Left Kidney:  Renal measurements: 11.7 x 5.6 x 5.2 cm = volume: 178 mL. A hyperechoic rounded region is identified in the lower pole abutting the  renal sinus fat. No angio myelolipoma or mass identified on previous CT imaging. This abnormality was not visualized on the previous ultrasound.  Bladder:  Appears normal for degree of bladder distention.  Other:  None.  IMPRESSION: 1. A hyperechoic rounded region is identified in the lower pole of the left kidney abutting the renal sinus fat. This finding is nonspecific. No mass or angiomyolipoma identified on previous CT imaging. This finding was not visualized on the previous ultrasound. Volume averaging off of adjacent renal sinus fat is a possibility. Recommend a follow-up ultrasound in 6 months to ensure stability or resolution. 2. No other abnormalities.   Electronically Signed By: Gerome Sam III M.D. On: 03/20/2023 18:12  No valid procedures specified. No results found for this or any previous visit.  Results for orders placed during the hospital encounter of 08/10/22  CT RENAL STONE STUDY  Narrative CLINICAL DATA:  Left flank pain  EXAM: CT ABDOMEN AND PELVIS WITHOUT CONTRAST  TECHNIQUE: Multidetector CT imaging of the abdomen and pelvis was performed following the standard protocol without IV contrast.  RADIATION DOSE REDUCTION: This exam was performed according to the departmental dose-optimization program which includes automated exposure control, adjustment of the mA and/or kV according to patient size and/or use of iterative reconstruction technique.  COMPARISON:  None Available.  FINDINGS: Lower chest: No acute abnormality.  Hepatobiliary: No focal liver abnormality is seen. Status post cholecystectomy. No biliary  dilatation.  Pancreas: Unremarkable  Spleen: Unremarkable  Adrenals/Urinary Tract: The adrenal glands are unremarkable. The kidneys are normal in size and position. Scattered nonobstructing renal calculi are seen within the kidneys bilaterally measuring up to 5 mm within the lower pole of the right kidney and 4 mm within the interpolar region of the left kidney. No hydronephrosis. No ureteral calculi. The bladder is unremarkable.  Stomach/Bowel: Stomach is within normal limits. Appendix appears normal. No evidence of bowel wall thickening, distention, or inflammatory changes.  Vascular/Lymphatic: Aortic atherosclerosis. No enlarged abdominal or pelvic lymph nodes.  Reproductive: Uterus and bilateral adnexa are unremarkable.  Other: No abdominal wall hernia.  No abdominopelvic ascites.  Musculoskeletal: Mild subcutaneous infiltration within the right lower quadrant abdominal wall is nonspecific, possibly related to local trauma or inflammation. L5-S1 not lumbar fusion with instrumentation has been performed. No acute bone abnormality. No lytic or blastic bone lesion.  IMPRESSION: 1. No acute intra-abdominal pathology identified. No definite radiographic explanation for the patient's reported symptoms. 2. Mild bilateral nonobstructing nephrolithiasis. No urolithiasis. No hydronephrosis.  Aortic Atherosclerosis (ICD10-I70.0).   Electronically Signed By: Helyn Numbers M.D. On: 08/12/2022 22:01   Assessment & Plan:    1. Nephrolithiasis [place] 6 months with renal US - Urinalysis, Routine w reflex microscopic  2. Flank pain Referral to pain management   No follow-ups on file.  Wilkie Aye, MD  [facility] [place]

## 2023-05-02 ENCOUNTER — Other Ambulatory Visit (HOSPITAL_COMMUNITY): Payer: Self-pay

## 2023-05-02 ENCOUNTER — Other Ambulatory Visit: Payer: Self-pay | Admitting: Urology

## 2023-05-02 DIAGNOSIS — R109 Unspecified abdominal pain: Secondary | ICD-10-CM

## 2023-05-02 DIAGNOSIS — N200 Calculus of kidney: Secondary | ICD-10-CM

## 2023-05-02 LAB — URINALYSIS, ROUTINE W REFLEX MICROSCOPIC
Bilirubin, UA: NEGATIVE
Ketones, UA: NEGATIVE
Leukocytes,UA: NEGATIVE
Nitrite, UA: NEGATIVE
Protein,UA: NEGATIVE
RBC, UA: NEGATIVE
Specific Gravity, UA: 1.03 (ref 1.005–1.030)
Urobilinogen, Ur: 0.2 mg/dL (ref 0.2–1.0)
pH, UA: 6 (ref 5.0–7.5)

## 2023-05-07 ENCOUNTER — Other Ambulatory Visit (HOSPITAL_COMMUNITY): Payer: Self-pay

## 2023-05-07 ENCOUNTER — Other Ambulatory Visit: Payer: Self-pay

## 2023-05-07 MED ORDER — CYCLOBENZAPRINE HCL 10 MG PO TABS
10.0000 mg | ORAL_TABLET | Freq: Three times a day (TID) | ORAL | 0 refills | Status: DC | PRN
Start: 2023-05-07 — End: 2023-05-14
  Filled 2023-05-07: qty 30, 10d supply, fill #0

## 2023-05-13 ENCOUNTER — Encounter: Payer: Self-pay | Admitting: Orthopedic Surgery

## 2023-05-13 ENCOUNTER — Ambulatory Visit: Payer: 59 | Admitting: Orthopedic Surgery

## 2023-05-13 VITALS — BP 122/77 | Ht <= 58 in | Wt 178.0 lb

## 2023-05-13 DIAGNOSIS — G5601 Carpal tunnel syndrome, right upper limb: Secondary | ICD-10-CM | POA: Diagnosis not present

## 2023-05-13 DIAGNOSIS — Z01818 Encounter for other preprocedural examination: Secondary | ICD-10-CM

## 2023-05-13 NOTE — Progress Notes (Signed)
Chief Complaint  Patient presents with   Results    Review nerve study right hand pain     Encounter Diagnosis  Name Primary?   Carpal tunnel syndrome of right wrist Yes   57 year old female with pain paresthesias right hand sent for nerve conduction study comes back to discuss results  Nerve conduction study was read as follows Plan: Impression: Clinical scenario seems to be more consistent with a median nerve neuropathy at the wrist versus double crush phenomenon with C6 radiculitis radiculopathy.  Cervical MRI reviewed.  Complicated by type 2 diabetes but with prior nerve conduction study of the lower limbs showing no polyneuropathy.  Electrodiagnostic study done today.  The above electrodiagnostic study is ABNORMAL and reveals evidence of a severe right median nerve entrapment at the wrist (carpal tunnel syndrome) affecting sensory and motor components. The lesion is characterized by sensory and motor demyelination with evidence of significant axonal injury.  Despite decompression may have some continued symptoms.   I reviewed this study.  She has demyelination and I told her this may not recover.  I think she will be out of work 4 weeks if she has the surgery.  She agrees to proceed with the surgery to try to save as much nerve function as possible.  Full history and physical to be done later

## 2023-05-13 NOTE — Patient Instructions (Signed)
Your surgery will be at  by Dr Harrison  The hospital will contact you with a preoperative appointment to discuss Anesthesia.  Please arrive on time or 15 minutes early for the preoperative appointment, they have a very tight schedule if you are late or do not come in your surgery will be cancelled.  The phone number is [phone]. Please bring your medications with you for the appointment. They will tell you the arrival time and medication instructions when you have your preoperative evaluation. Do not wear nail polish the day of your surgery and if you take Phentermine you need to stop this medication ONE WEEK prior to your surgery. If you take Invokana, Farxiga, Jardiance, or Steglatro) - Hold 72 hours before the procedure.  If you take Ozempic,  Bydureon or Trulicity do not take for 8 days before your surgery. If you take Victoza, Rybelsis, Saxenda or Adlyxi stop 24 hours before the procedure.  Please arrive at the hospital 2 hours before procedure if scheduled at 9:30 or later in the day or at the time the nurse tells you at your preoperative visit.   If you have my chart do not use the time given in my chart use the time given to you by the nurse during your preoperative visit.   Your surgery  time may change. Please be available for phone calls the day of your surgery and the day before. The Short Stay department may need to discuss changes about your surgery time. Not reaching the you could lead to procedure delays and possible cancellation.  You must have a ride home and someone to stay with you for 24 to 48 hours. The person taking you home will receive and sign for the your discharge instructions.  Please be prepared to give your support person's name and telephone number to Central Registration. Dr Harrison will need that name and phone number post procedure.   

## 2023-05-13 NOTE — Addendum Note (Signed)
Addended byCaffie Damme on: 05/13/2023 03:38 PM   Modules accepted: Orders

## 2023-05-14 ENCOUNTER — Other Ambulatory Visit: Payer: Self-pay | Admitting: Urology

## 2023-05-14 ENCOUNTER — Other Ambulatory Visit: Payer: Self-pay | Admitting: Internal Medicine

## 2023-05-14 DIAGNOSIS — F5101 Primary insomnia: Secondary | ICD-10-CM

## 2023-05-14 DIAGNOSIS — R109 Unspecified abdominal pain: Secondary | ICD-10-CM

## 2023-05-14 DIAGNOSIS — N200 Calculus of kidney: Secondary | ICD-10-CM

## 2023-05-15 ENCOUNTER — Other Ambulatory Visit (HOSPITAL_COMMUNITY): Payer: Self-pay

## 2023-05-15 ENCOUNTER — Other Ambulatory Visit: Payer: Self-pay

## 2023-05-15 MED ORDER — HYDROXYZINE PAMOATE 50 MG PO CAPS
50.0000 mg | ORAL_CAPSULE | Freq: Every day | ORAL | 0 refills | Status: DC | PRN
Start: 2023-05-15 — End: 2023-06-17
  Filled 2023-05-15 – 2023-06-14 (×4): qty 30, 30d supply, fill #0

## 2023-05-18 ENCOUNTER — Other Ambulatory Visit (HOSPITAL_COMMUNITY): Payer: Self-pay

## 2023-05-18 MED ORDER — CYCLOBENZAPRINE HCL 10 MG PO TABS
10.0000 mg | ORAL_TABLET | Freq: Three times a day (TID) | ORAL | 0 refills | Status: DC | PRN
Start: 2023-05-18 — End: 2023-05-22
  Filled 2023-05-18: qty 30, 10d supply, fill #0

## 2023-05-20 ENCOUNTER — Other Ambulatory Visit: Payer: Self-pay

## 2023-05-22 ENCOUNTER — Other Ambulatory Visit: Payer: Self-pay | Admitting: Internal Medicine

## 2023-05-22 ENCOUNTER — Other Ambulatory Visit: Payer: Self-pay | Admitting: Urology

## 2023-05-22 DIAGNOSIS — N200 Calculus of kidney: Secondary | ICD-10-CM

## 2023-05-22 DIAGNOSIS — R109 Unspecified abdominal pain: Secondary | ICD-10-CM

## 2023-05-23 ENCOUNTER — Other Ambulatory Visit: Payer: Self-pay

## 2023-05-23 ENCOUNTER — Other Ambulatory Visit (HOSPITAL_COMMUNITY): Payer: Self-pay

## 2023-05-23 MED ORDER — SYNJARDY 12.5-500 MG PO TABS
1.0000 | ORAL_TABLET | Freq: Every day | ORAL | 0 refills | Status: DC
  Filled 2023-05-23: qty 90, 90d supply, fill #0

## 2023-05-24 ENCOUNTER — Other Ambulatory Visit: Payer: Self-pay

## 2023-05-27 ENCOUNTER — Other Ambulatory Visit (HOSPITAL_COMMUNITY): Payer: Self-pay

## 2023-05-28 ENCOUNTER — Other Ambulatory Visit: Payer: Self-pay

## 2023-05-28 ENCOUNTER — Other Ambulatory Visit (HOSPITAL_COMMUNITY): Payer: Self-pay

## 2023-05-28 ENCOUNTER — Other Ambulatory Visit: Payer: Self-pay | Admitting: "Endocrinology

## 2023-05-28 DIAGNOSIS — E782 Mixed hyperlipidemia: Secondary | ICD-10-CM

## 2023-05-28 DIAGNOSIS — E1169 Type 2 diabetes mellitus with other specified complication: Secondary | ICD-10-CM

## 2023-05-28 DIAGNOSIS — Z794 Long term (current) use of insulin: Secondary | ICD-10-CM | POA: Diagnosis not present

## 2023-05-28 MED ORDER — CYCLOBENZAPRINE HCL 10 MG PO TABS
10.0000 mg | ORAL_TABLET | Freq: Three times a day (TID) | ORAL | 0 refills | Status: DC | PRN
Start: 2023-05-28 — End: 2023-08-19
  Filled 2023-05-28: qty 30, 10d supply, fill #0

## 2023-05-29 ENCOUNTER — Other Ambulatory Visit (HOSPITAL_COMMUNITY): Payer: Self-pay

## 2023-05-29 ENCOUNTER — Ambulatory Visit (INDEPENDENT_AMBULATORY_CARE_PROVIDER_SITE_OTHER): Payer: 59 | Admitting: "Endocrinology

## 2023-05-29 ENCOUNTER — Encounter: Payer: Self-pay | Admitting: "Endocrinology

## 2023-05-29 VITALS — BP 122/68 | HR 80 | Ht <= 58 in | Wt 176.6 lb

## 2023-05-29 DIAGNOSIS — I10 Essential (primary) hypertension: Secondary | ICD-10-CM

## 2023-05-29 DIAGNOSIS — E782 Mixed hyperlipidemia: Secondary | ICD-10-CM

## 2023-05-29 DIAGNOSIS — Z794 Long term (current) use of insulin: Secondary | ICD-10-CM

## 2023-05-29 DIAGNOSIS — E1169 Type 2 diabetes mellitus with other specified complication: Secondary | ICD-10-CM | POA: Diagnosis not present

## 2023-05-29 DIAGNOSIS — Z6838 Body mass index (BMI) 38.0-38.9, adult: Secondary | ICD-10-CM

## 2023-05-29 DIAGNOSIS — Z7985 Long-term (current) use of injectable non-insulin antidiabetic drugs: Secondary | ICD-10-CM

## 2023-05-29 LAB — LIPID PANEL
Chol/HDL Ratio: 2.9 ratio (ref 0.0–4.4)
Cholesterol, Total: 144 mg/dL (ref 100–199)
HDL: 50 mg/dL (ref >39)
LDL Chol Calc (NIH): 57 mg/dL (ref 0–99)
Triglycerides: 230 mg/dL — ABNORMAL HIGH (ref 0–149)
VLDL Cholesterol Cal: 37 mg/dL (ref 5–40)

## 2023-05-29 LAB — COMPREHENSIVE METABOLIC PANEL
ALT: 22 IU/L (ref 0–32)
AST: 26 IU/L (ref 0–40)
Albumin: 4.4 g/dL (ref 3.8–4.9)
Alkaline Phosphatase: 118 IU/L (ref 44–121)
BUN/Creatinine Ratio: 19 (ref 9–23)
BUN: 14 mg/dL (ref 6–24)
Bilirubin Total: 0.5 mg/dL (ref 0.0–1.2)
CO2: 28 mmol/L (ref 20–29)
Calcium: 9.4 mg/dL (ref 8.7–10.2)
Chloride: 99 mmol/L (ref 96–106)
Creatinine, Ser: 0.73 mg/dL (ref 0.57–1.00)
Globulin, Total: 2.5 g/dL (ref 1.5–4.5)
Glucose: 119 mg/dL — ABNORMAL HIGH (ref 70–99)
Potassium: 4.3 mmol/L (ref 3.5–5.2)
Sodium: 140 mmol/L (ref 134–144)
Total Protein: 6.9 g/dL (ref 6.0–8.5)
eGFR: 96 mL/min/{1.73_m2} (ref >59)

## 2023-05-29 LAB — POCT GLYCOSYLATED HEMOGLOBIN (HGB A1C): HbA1c, POC (controlled diabetic range): 6.5 % (ref 0.0–7.0)

## 2023-05-29 LAB — T4, FREE: Free T4: 1.04 ng/dL (ref 0.82–1.77)

## 2023-05-29 LAB — TSH: TSH: 1.98 u[IU]/mL (ref 0.450–4.500)

## 2023-05-29 MED ORDER — TRULICITY 4.5 MG/0.5ML ~~LOC~~ SOAJ
4.5000 mg | SUBCUTANEOUS | 1 refills | Status: DC
  Filled 2023-05-29: qty 2, 28d supply, fill #0
  Filled 2023-07-16: qty 2, 28d supply, fill #1
  Filled 2023-09-24 (×2): qty 2, 28d supply, fill #2
  Filled 2023-10-24: qty 2, 28d supply, fill #3
  Filled 2024-02-21: qty 2, 28d supply, fill #4
  Filled 2024-03-20: qty 2, 28d supply, fill #5

## 2023-05-29 NOTE — Patient Instructions (Signed)
                                     Advice for Weight Management  -For most of us the best way to lose weight is by diet management. Generally speaking, diet management means consuming less calories intentionally which over time brings about progressive weight loss.  This can be achieved more effectively by avoiding ultra processed carbohydrates, processed meats, unhealthy fats.    It is critically important to know your numbers: how much calorie you are consuming and how much calorie you need. More importantly, our carbohydrates sources should be unprocessed naturally occurring  complex starch food items.  It is always important to balance nutrition also by  appropriate intake of proteins (mainly plant-based), healthy fats/oils, plenty of fruits and vegetables.   -The American College of Lifestyle Medicine (ACL M) recommends nutrition derived mostly from Whole Food, Plant Predominant Sources example an apple instead of applesauce or apple pie. Eat Plenty of vegetables, Mushrooms, fruits, Legumes, Whole Grains, Nuts, seeds in lieu of processed meats, processed snacks/pastries red meat, poultry, eggs.  Use only water or unsweetened tea for hydration.  The College also recommends the need to stay away from risky substances including alcohol, smoking; obtaining 7-9 hours of restorative sleep, at least 150 minutes of moderate intensity exercise weekly, importance of healthy social connections, and being mindful of stress and seek help when it is overwhelming.    -Sticking to a routine mealtime to eat 3 meals a day and avoiding unnecessary snacks is shown to have a big role in weight control. Under normal circumstances, the only time we burn stored energy is when we are hungry, so allow  some hunger to take place- hunger means no food between appropriate meal times, only water.  It is not advisable to starve.   -It is better to avoid simple carbohydrates including:  Cakes, Sweet Desserts, Ice Cream, Soda (diet and regular), Sweet Tea, Candies, Chips, Cookies, Store Bought Juices, Alcohol in Excess of  1-2 drinks a day, Lemonade,  Artificial Sweeteners, Doughnuts, Coffee Creamers, "Sugar-free" Products, etc, etc.  This is not a complete list.....    -Consulting with certified diabetes educators is proven to provide you with the most accurate and current information on diet.  Also, you may be  interested in discussing diet options/exchanges , we can schedule a visit with [name], RDN, CDE for individualized nutrition education.  -Exercise: If you are able: 30 -60 minutes a day ,4 days a week, or 150 minutes of moderate intensity exercise weekly.    The longer the better if tolerated.  Combine stretch, strength, and aerobic activities.  If you were told in the past that you have high risk for cardiovascular diseases, or if you are currently symptomatic, you may seek evaluation by your heart doctor prior to initiating moderate to intense exercise programs.                                  Additional Care Considerations for Diabetes/Prediabetes   -Diabetes  is a chronic disease.  The most important care consideration is regular follow-up with your diabetes care provider with the goal being avoiding or delaying its complications and to take advantage of advances in medications and technology.  If appropriate actions are taken early enough, type 2 diabetes can even be   reversed.  Seek information from the right source.  - Whole Food, Plant Predominant Nutrition is highly recommended: Eat Plenty of vegetables, Mushrooms, fruits, Legumes, Whole Grains, Nuts, seeds in lieu of processed meats, processed snacks/pastries red meat, poultry, eggs as recommended by American College of  Lifestyle Medicine (ACLM).  -Type 2 diabetes is known to coexist with other important comorbidities such as high blood pressure and high cholesterol.  It is critical to control not only the  diabetes but also the high blood pressure and high cholesterol to minimize and delay the risk of complications including coronary artery disease, stroke, amputations, blindness, etc.  The good news is that this diet recommendation for type 2 diabetes is also very helpful for managing high cholesterol and high blood blood pressure.  - Studies showed that people with diabetes will benefit from a class of medications known as ACE inhibitors and statins.  Unless there are specific reasons not to be on these medications, the standard of care is to consider getting one from these groups of medications at an optimal doses.  These medications are generally considered safe and proven to help protect the heart and the kidneys.    - People with diabetes are encouraged to initiate and maintain regular follow-up with eye doctors, foot doctors, dentists , and if necessary heart and kidney doctors.     - It is highly recommended that people with diabetes quit smoking or stay away from smoking, and get yearly  flu vaccine and pneumonia vaccine at least every 5 years.  See above for additional recommendations on exercise, sleep, stress management , and healthy social connections.      

## 2023-05-29 NOTE — Progress Notes (Unsigned)
05/29/2023, 5:42 PM   Endocrinology follow-up note  Subjective:    Patient ID: [name], female    DOB: 1966/11/05.  [name] is being seen in follow up after she was seen in consultation for management of currently uncontrolled symptomatic diabetes requested by  Anabel Halon, MD.   Past Medical History:  Diagnosis Date   Diabetes mellitus    insulin pump 2013   Dysphagia 03/09/2020   Epidural hematoma (HCC) 12/14/2021   GERD (gastroesophageal reflux disease)    History of kidney stones    Hypercholesteremia    Hypertension    Incarcerated umbilical hernia 03/25/2014   Kidney stone    Kidney stones 08/31/2008   Qualifier: Diagnosis of  By: Erby Pian MD, Cornelius     Neuropathy    Obesity    Palpitations    Shortness of breath    Vertigo     Past Surgical History:  Procedure Laterality Date   BIOPSY  06/23/2020   Procedure: BIOPSY;  Surgeon: Corbin Ade, MD;  [facility];  Service: Endoscopy;;   CARDIAC CATHETERIZATION  12/11/2012   normal coronary arteries   CESAREAN SECTION  1994;2000   [facility]   CHOLECYSTECTOMY  1992   COLONOSCOPY WITH PROPOFOL N/A 06/23/2020   large amount of stool in entire colon, prep inadequate.    CYSTOSCOPY W/ URETERAL STENT PLACEMENT  05/08/2012   Procedure: CYSTOSCOPY WITH RETROGRADE PYELOGRAM/URETERAL STENT PLACEMENT;  Surgeon: Ky Barban, MD;  Location: [facility];  Service: Urology;  Laterality: Right;   CYSTOSCOPY WITH RETROGRADE PYELOGRAM, URETEROSCOPY AND STENT PLACEMENT Right 05/04/2020   Procedure: CYSTOSCOPY WITH RIGHT RETROGRADE PYELOGRAM, RIGHT URETEROSCOPY AND RIGHT URETERAL STENT EXCHANGE;  Surgeon: Malen Gauze, MD;  Location: [facility];  Service: Urology;  Laterality: Right;   CYSTOSCOPY WITH RETROGRADE PYELOGRAM, URETEROSCOPY AND STENT PLACEMENT Left 08/08/2020   Procedure: CYSTOSCOPY WITH RETROGRADE PYELOGRAM, URETEROSCOPY WITH LASER  AND STENT PLACEMENT;  Surgeon:  Malen Gauze, MD;  Location: [facility];  Service: Urology;  Laterality: Left;   CYSTOSCOPY WITH RETROGRADE PYELOGRAM, URETEROSCOPY AND STENT PLACEMENT Right 11/08/2020   Procedure: CYSTOSCOPY WITH RIGHT RETROGRADE PYELOGRAM, RIGHT URETEROSCOPY AND STENT PLACEMENT;  Surgeon: Malen Gauze, MD;  Location: [facility];  Service: Urology;  Laterality: Right;   CYSTOSCOPY WITH RETROGRADE PYELOGRAM, URETEROSCOPY AND STENT PLACEMENT Left 12/27/2020   Procedure: CYSTOSCOPY WITH LEFT RETROGRADE PYELOGRAM, LEFT URETEROSCOPY AND LEFT URETERAL STENT PLACEMENT;  Surgeon: Malen Gauze, MD;  Location: [facility];  Service: Urology;  Laterality: Left;   CYSTOSCOPY WITH RETROGRADE PYELOGRAM, URETEROSCOPY AND STENT PLACEMENT Bilateral 03/16/2021   Procedure: CYSTOSCOPY WITH BILATERAL  RETROGRADE PYELOGRAM, BILATERAL URETEROSCOPY AND STENT PLACEMENT ON THE LEFT;  Surgeon: Malen Gauze, MD;  Location: [facility];  Service: Urology;  Laterality: Bilateral;   CYSTOSCOPY WITH RETROGRADE PYELOGRAM, URETEROSCOPY AND STENT PLACEMENT Left 06/28/2022   Procedure: CYSTOSCOPY WITH RETROGRADE PYELOGRAM, URETEROSCOPY AND STENT PLACEMENT;  Surgeon: Malen Gauze, MD;  Location: [facility];  Service: Urology;  Laterality: Left;   CYSTOSCOPY WITH RETROGRADE PYELOGRAM, URETEROSCOPY AND STENT PLACEMENT Bilateral 09/27/2022   Procedure: CYSTOSCOPY WITH BILATERAL RETROGRADE PYELOGRAM, URETEROSCOPY AND STENT PLACEMENT;  Surgeon: Malen Gauze, MD;  Location: [facility];  Service: Urology;  Laterality: Bilateral;  pt knows to arrive at 6:00   CYSTOSCOPY WITH STENT PLACEMENT Right 02/25/2020   Procedure: CYSTOSCOPY WITH RIGHT RETROGRADE RIGHT STENT PLACEMENT;  Surgeon: Jerilee Field, MD;  Location: [facility];  Service: Urology;  Laterality: Right;   ESOPHAGOGASTRODUODENOSCOPY (EGD) WITH PROPOFOL N/A 06/23/2020   Normal esophagus, multiple localized erosions were found in gastric antrum s/p biopsy, normal duodenum. Empiric  dilation.Reactive gastropathy with erosions, negative H.pylori, no dysplasia.     EXTRACORPOREAL SHOCK WAVE LITHOTRIPSY Right 10/11/2020   Procedure: EXTRACORPOREAL SHOCK WAVE LITHOTRIPSY (ESWL);  Surgeon: Malen Gauze, MD;  Location: [facility];  Service: Urology;  Laterality: Right;   FOOT SURGERY Right March, 2013   [facility]-removal of bone spur   HOLMIUM LASER APPLICATION  05/04/2020   Procedure: HOLMIUM LASER LITHOTRIPSY RIGHT URETERAL CALCULUS;  Surgeon: Malen Gauze, MD;  Location: [facility];  Service: Urology;;   HYSTEROSCOPY WITH THERMACHOICE  04/25/2012   Procedure: HYSTEROSCOPY WITH THERMACHOICE;  Surgeon: Lazaro Arms, MD;  Location: [facility];  Service: Gynecology;  Laterality: N/A;  total therapy time= 11 minutes 42 seconds; 34 ml D5w  in and 34 ml D5w out; temp =87 degrees F   LEFT HEART CATHETERIZATION WITH CORONARY ANGIOGRAM N/A 12/11/2012   Procedure: LEFT HEART CATHETERIZATION WITH CORONARY ANGIOGRAM;  Surgeon: Runell Gess, MD;  Location: [facility] CATH LAB;  Service: Cardiovascular;  Laterality: N/A;   LUMBAR WOUND DEBRIDEMENT N/A 12/14/2021   Procedure: LUMBAR HEMATOMA EVACUATION;  Surgeon: Jadene Pierini, MD;  Location: [facility];  Service: Neurosurgery;  Laterality: N/A;   MALONEY DILATION N/A 06/23/2020   Procedure: [name] DILATION;  Surgeon: Corbin Ade, MD;  [facility];  Service: Endoscopy;  Laterality: N/A;   Sinus sergery  1988   [place]   STONE EXTRACTION WITH BASKET Right 11/08/2020   Procedure: STONE EXTRACTION WITH BASKET;  Surgeon: Malen Gauze, MD;  Location: [facility];  Service: Urology;  Laterality: Right;   TRANSFORAMINAL LUMBAR INTERBODY FUSION (TLIF) WITH PEDICLE SCREW FIXATION 1 LEVEL N/A 12/05/2021   Procedure: Open Lumbar five-Sacral one Laminectomy/Transforaminal Lumbar Interbody Fusion/Posterolateral instrumented fusion;  Surgeon: Jadene Pierini, MD;  Location: [facility];  Service: Neurosurgery;  Laterality: N/A;  3C    UMBILICAL HERNIA REPAIR N/A 03/25/2014   Procedure: UMBILICAL HERNIA REPAIR;  Surgeon: Marlane Hatcher, MD;  Location: [facility];  Service: General;  Laterality: N/A;   uterine ablation      Social History   Socioeconomic History   Marital status: Married    Spouse name: Not on file   Number of children: 2   Years of education: college   Highest education level: Some college, no degree  Occupational History   Occupation: Scientist, research (medical): Audiological scientist: [employer]   Occupation: Lawyer- at [employer]  Tobacco Use   Smoking status: Never   Smokeless tobacco: Never  Vaping Use   Vaping Use: Never used  Substance and Sexual Activity   Alcohol use: No   Drug use: No   Sexual activity: Yes    Partners: Male    Birth control/protection: Post-menopausal    Comment: ablation  Other Topics Concern   Not on file  Social History Narrative   Regular exercise: walks Caffeine use:    Social Determinants of Health   Financial Resource Strain: Low Risk  (04/12/2023)   Overall Financial Resource Strain (CARDIA)    Difficulty of Paying Living Expenses: Not hard at all  Food Insecurity: No Food Insecurity (04/12/2023)   Hunger Vital Sign    Worried About Running Out of Food in the Last Year: Never true    Ran Out of Food in  the Last Year: Never true  Transportation Needs: No Transportation Needs (04/12/2023)   PRAPARE - Administrator, Civil Service (Medical): No    Lack of Transportation (Non-Medical): No  Physical Activity: Insufficiently Active (04/12/2023)   Exercise Vital Sign    Days of Exercise per Week: 2 days    Minutes of Exercise per Session: 10 min  Stress: Stress Concern Present (04/12/2023)   [employer] of Occupational Health - Occupational Stress Questionnaire    Feeling of Stress : To some extent  Social Connections: Moderately Integrated (04/12/2023)   Social Connection and Isolation Panel [NHANES]    Frequency of Communication with  Friends and Family: More than three times a week    Frequency of Social Gatherings with Friends and Family: Once a week    Attends Religious Services: 1 to 4 times per year    Active Member of [employer] or Organizations: No    Attends Engineer, structural: Never    Marital Status: Married    Family History  Problem Relation Age of Onset   Depression Paternal Grandfather    Depression Paternal Grandmother    Diabetes Father    Hypertension Mother    Cancer Mother    Diabetes Mother    Stroke Mother    Alzheimer's disease Mother    Cancer Paternal Uncle    Heart disease Other    Cancer Other    Diabetes Other    Achalasia Other    Anesthesia problems Neg Hx    Hypotension Neg Hx    Malignant hyperthermia Neg Hx    Pseudochol deficiency Neg Hx    Colon cancer Neg Hx    Colon polyps Neg Hx     Outpatient Encounter Medications as of 05/29/2023  Medication Sig   Dulaglutide (TRULICITY) 4.5 MG/0.5ML SOPN Inject 4.5 mg as directed once a week.   albuterol (VENTOLIN HFA) 108 (90 Base) MCG/ACT inhaler Inhale 2 puffs into the lungs every 6 (six) hours as needed for wheezing or shortness of breath.    blood glucose meter kit and supplies Use up to four times daily as directed.   cyclobenzaprine (FLEXERIL) 10 MG tablet Take 1 tablet (10 mg total) by mouth 3 (three) times daily as needed for muscle spasms.   Empagliflozin-metFORMIN HCl (SYNJARDY) 12.5-500 MG TABS Take 1 tablet by mouth daily.   furosemide (LASIX) 20 MG tablet Take 20 mg by mouth 2 (two) times daily as needed for edema.   gabapentin (NEURONTIN) 600 MG tablet Take 1 tablet (600 mg total) by mouth 3 (three) times daily.   glucose blood test strip Test up to 4 times a day   hydrOXYzine (VISTARIL) 50 MG capsule Take 1 capsule (50 mg total) by mouth daily as needed for anxiety (Insomnia).   Lancets (FREESTYLE) lancets Use to test 4 times daily   lisinopril-hydrochlorothiazide (ZESTORETIC) 20-12.5 MG tablet Take 1 tablet by  mouth daily.   meclizine (ANTIVERT) 25 MG tablet TAKE 1 TABLET(25 MG) BY MOUTH THREE TIMES DAILY AS NEEDED FOR DIZZINESS OR NAUSEA   meloxicam (MOBIC) 7.5 MG tablet Take 1 tablet (7.5 mg total) by mouth daily.   omeprazole (PRILOSEC) 20 MG capsule Take 1 capsule (20 mg total) by mouth daily.   ondansetron (ZOFRAN) 4 MG tablet Take 1 tablet (4 mg total) by mouth daily as needed for nausea or vomiting.   oxyCODONE-acetaminophen (PERCOCET/ROXICET) 5-325 MG tablet Take 1 tablet by mouth every 8 (eight) hours as needed for severe pain.  Potassium Citrate (UROCIT-K 15) 15 MEQ (1620 MG) TBCR Take 1 tablet by mouth in the morning and at bedtime.   promethazine (PHENERGAN) 25 MG tablet TAKE 1 TABLET(25 MG) BY MOUTH EVERY 8 HOURS AS NEEDED FOR NAUSEA OR VOMITING   simvastatin (ZOCOR) 40 MG tablet Take 1 tablet (40 mg total) by mouth every evening for cholesterol   zolpidem (AMBIEN) 5 MG tablet Take 1 tablet (5 mg total) by mouth at bedtime as needed for sleep.   [DISCONTINUED] Dulaglutide (TRULICITY) 3 MG/0.5ML SOPN Inject 3 mg into the skin once a week.   No facility-administered encounter medications on file as of 05/29/2023.    ALLERGIES: Allergies  Allergen Reactions   Ciprofloxacin Hives and Swelling   Penicillins Anaphylaxis and Rash   Codeine Other (See Comments)    had hallicunations   Morphine Nausea And Vomiting and Other (See Comments)    recieved in ED due to [facility] and had multple doses - gave visual hallucinations and vomitting.   Sulfonamide Derivatives Other (See Comments)    REACTION: Unsure - childhood allergy   Vancomycin Itching    VACCINATION STATUS: Immunization History  Administered Date(s) Administered   H1N1 09/29/2008   Influenza Split 08/25/2012   Influenza Whole 08/31/2008   Influenza,inj,Quad PF,6+ Mos 10/19/2013, 08/30/2021, 09/13/2022   Moderna Covid-19 Vaccine Bivalent Booster 42yrs & up 09/09/2021   Moderna Sars-Covid-2 Vaccination 01/05/2020,  02/02/2020   Pneumococcal Polysaccharide-23 09/14/2008, 02/21/2014   Tdap 06/28/2020   Zoster Recombinat (Shingrix) 04/04/2022, 10/08/2022    Diabetes She presents for her follow-up diabetic visit. She has type 2 diabetes mellitus. Her disease course has been stable. Pertinent negatives for hypoglycemia include no confusion, headaches, pallor or seizures. Pertinent negatives for diabetes include no chest pain, no polydipsia, no polyphagia and no polyuria. Symptoms are stable. There are no diabetic complications. Risk factors for coronary artery disease include dyslipidemia, diabetes mellitus, obesity, sedentary lifestyle and hypertension. Her weight is fluctuating minimally. She is following a generally unhealthy diet. When asked about meal planning, she reported none. Her home blood glucose trend is fluctuating minimally. (She presents with controlled glycemic profile.  Her point-of-care A1c is 6.5%, overall improving from 10.2%.  She is not on insulin anymore.  She is tolerating her Trulicity and Synjardy.     ) An ACE inhibitor/angiotensin II receptor blocker is being taken.  Hyperlipidemia This is a chronic problem. Exacerbating diseases include diabetes. Pertinent negatives include no chest pain, myalgias or shortness of breath. Current antihyperlipidemic treatment includes statins. Risk factors for coronary artery disease include diabetes mellitus, dyslipidemia, hypertension, obesity, family history and a sedentary lifestyle.  Hypertension This is a chronic problem. The current episode started more than 1 year ago. The problem is controlled. Pertinent negatives include no chest pain, headaches, palpitations or shortness of breath. Risk factors for coronary artery disease include dyslipidemia, diabetes mellitus, post-menopausal state, sedentary lifestyle, family history and obesity. Past treatments include ACE inhibitors and diuretics.    Review of Systems  Constitutional:  Negative for chills,  fever and unexpected weight change.  HENT:  Negative for trouble swallowing and voice change.   Eyes:  Negative for visual disturbance.  Respiratory:  Negative for cough, shortness of breath and wheezing.   Cardiovascular:  Negative for chest pain, palpitations and leg swelling.  Gastrointestinal:  Negative for diarrhea, nausea and vomiting.  Endocrine: Negative for cold intolerance, heat intolerance, polydipsia, polyphagia and polyuria.  Musculoskeletal:  Negative for arthralgias and myalgias.  Skin:  Negative for color change,  pallor, rash and wound.  Neurological:  Negative for seizures and headaches.  Psychiatric/Behavioral:  Negative for confusion and suicidal ideas.     Objective:       05/29/2023    3:14 PM 05/13/2023    3:11 PM 05/01/2023    3:28 PM  Vitals with BMI  Height 4\' 8"  4\' 8"    Weight 176 lbs 10 oz 178 lbs   BMI 39.62 39.93   Systolic 122 122 762  Diastolic 68 77 77  Pulse 80  81    BP 122/68   Pulse 80   Ht 4\' 8"  (1.422 m)   Wt 176 lb 9.6 oz (80.1 kg)   LMP 07/16/2012   BMI 39.59 kg/m   Wt Readings from Last 3 Encounters:  05/29/23 176 lb 9.6 oz (80.1 kg)  05/13/23 178 lb (80.7 kg)  04/15/23 178 lb 3.2 oz (80.8 kg)      CMP ( most recent) CMP     Component Value Date/Time   NA 140 05/28/2023 1513   K 4.3 05/28/2023 1513   CL 99 05/28/2023 1513   CO2 28 05/28/2023 1513   GLUCOSE 119 (H) 05/28/2023 1513   GLUCOSE 113 (H) 09/25/2022 1449   BUN 14 05/28/2023 1513   CREATININE 0.73 05/28/2023 1513   CREATININE 0.89 05/25/2020 1626   CALCIUM 9.4 05/28/2023 1513   PROT 6.9 05/28/2023 1513   ALBUMIN 4.4 05/28/2023 1513   AST 26 05/28/2023 1513   ALT 22 05/28/2023 1513   ALKPHOS 118 05/28/2023 1513   BILITOT 0.5 05/28/2023 1513   GFRNONAA >60 09/25/2022 1449   GFRAA >60 08/05/2020 1248    Diabetic Labs (most recent): Lab Results  Component Value Date   HGBA1C 6.5 05/29/2023   HGBA1C 6.2 (H) 09/25/2022   HGBA1C 6.5 06/07/2022   MICROALBUR  10 11/27/2022   MICROALBUR 1.10 11/14/2012     Lipid Panel ( most recent) Lipid Panel     Component Value Date/Time   CHOL 144 05/28/2023 1513   TRIG 230 (H) 05/28/2023 1513   HDL 50 05/28/2023 1513   CHOLHDL 2.9 05/28/2023 1513   CHOLHDL 4.0 07/20/2013 1100   VLDL 48 (H) 07/20/2013 1100   LDLCALC 57 05/28/2023 1513   LABVLDL 37 05/28/2023 1513      Lab Results  Component Value Date   TSH 1.980 05/28/2023   TSH 1.960 02/06/2023   TSH 1.110 05/24/2022   TSH 1.438 11/18/2012   TSH 4.326 09/01/2008   FREET4 1.04 05/28/2023   FREET4 0.87 02/06/2023   FREET4 0.87 05/24/2022     Assessment & Plan:   Type 2 diabetes, unspecified complication using insulin pump  - Brion THIESSEN has currently uncontrolled symptomatic type 2 DM since  57 years of age,.  She presents with controlled glycemic profile.  Her point-of-care A1c is 6.5%, overall improving from 10.2%.  She is not on insulin anymore.  She is tolerating her Trulicity and Synjardy.      She has not achieved significant excess after she was taken off of insulin. - I had a long discussion with her about the progressive nature of diabetes and the pathology behind its complications.  She does not report gross complications from her diabetes, however she remains at a high risk for more acute and chronic complications which include CAD, CVA, CKD, retinopathy, and neuropathy. These are all discussed in detail with her.  - I discussed all available options of managing her diabetes including de-escalation of medications. I  have counseled her on diet  and weight management  by adopting a Whole Food , Plant Predominant  ( WFPP) nutrition as recommended by [employer] of Lifestyle Medicine. Patient is encouraged to switch to  unprocessed or minimally processed  complex starch, adequate protein intake (mainly plant source), minimal liquid fat ( mainly vegetable oils), plenty of fruits, and vegetables. -  she is advised to stick to a  routine mealtimes to eat 3 complete meals a day and snack only when necessary ( to snack only to correct hypoglycemia BG <70 day time or <100 at night).   - she acknowledges that there is a room for improvement in her food and drink choices. - Suggestion is made for her to avoid simple carbohydrates  from her diet including Cakes, Sweet Desserts, Ice Cream, Soda (diet and regular), Sweet Tea, Candies, Chips, Cookies, Store Bought Juices, Alcohol , Artificial Sweeteners,  Coffee Creamer, and "Sugar-free" Products, Lemonade. This will help patient to have more stable blood glucose profile and potentially avoid unintended weight gain.  The following Lifestyle Medicine recommendations according to American College of Lifestyle Medicine  [facility]) were discussed and and offered to patient and she  agrees to start the journey:  A. Whole Foods, Plant-Based Nutrition comprising of fruits and vegetables, plant-based proteins, whole-grain carbohydrates was discussed in detail with the patient.   A list for source of those nutrients were also provided to the patient.  Patient will use only water or unsweetened tea for hydration. B.  The need to stay away from risky substances including alcohol, smoking; obtaining 7 to 9 hours of restorative sleep, at least 150 minutes of moderate intensity exercise weekly, the importance of healthy social connections,  and stress management techniques were discussed. C.  A full color page of  Calorie density of various food groups per pound showing examples of each food groups was provided to the patient.    - she will be scheduled with Norm Salt, RDN, CDE for individualized diabetes education.  She has successfully avoided insulin for several months now .she may benefit from a higher dose of Trulicity.  I discussed and increase her Trulicity to 4.5 mg subcutaneously weekly.   She is advised to continue Synjardy 12.5/500 mg once a day at breakfast.  - she is encouraged to  call clinic for blood glucose levels less than 70 or above 150 mg per DL at fasting.     - Specific targets for  A1c;  LDL, HDL,  and Triglycerides were discussed with the patient.  2) Blood Pressure /Hypertension:    -Her blood pressure is controlled to target   she is advised to take half tablet of her current lisinopril/HCTZ 20-12 0.5 mg p.o. daily with breakfast .  3) Lipids/Hyperlipidemia:   Review of her recent lipid panel showed  controlled  LDL at 72 .  This is an improvement from overall LDL of 113.  she  is advised to continue simvastatin 40 monocularly and at bedtime.     Side effects and precautions discussed with her.  4)  Weight/Diet:  Body mass index is 39.59 kg/m.  -She has lost 21 pounds so far.  Clearly complicating her diabetes care.   she is  a candidate for weight loss. I discussed with her the fact that loss of 5 - 10% of her  current body weight will have the most impact on her diabetes management.  The above detailed  AC[facility] recommendations for nutrition, exercise, sleep, social life,  avoidance of risky substances, the need for restorative sleep   information will also detailed on discharge instructions.  5) Chronic Care/Health Maintenance:  -she  is on ACEI/ARB and Statin medications and  is encouraged to initiate and continue to follow up with Ophthalmology, Dentist,  Podiatrist at least yearly or according to recommendations, and advised to   stay away from smoking. I have recommended yearly flu vaccine and pneumonia vaccine at least every 5 years; moderate intensity exercise for up to 150 minutes weekly; and  sleep for 7- 9 hours a day.  - she is  advised to maintain close follow up with Anabel Halon, MD for primary care needs, as well as her other providers for optimal and coordinated care.   I spent  26  minutes in the care of the patient today including review of labs from CMP, Lipids, Thyroid Function, Hematology (current and previous including abstractions  from other facilities); face-to-face time discussing  her blood glucose readings/logs, discussing hypoglycemia and hyperglycemia episodes and symptoms, medications doses, her options of short and long term treatment based on the latest standards of care / guidelines;  discussion about incorporating lifestyle medicine;  and documenting the encounter. Risk reduction counseling performed per USPSTF guidelines to reduce  obesity and cardiovascular risk factors.     Please refer to Patient Instructions for Blood Glucose Monitoring and Insulin/Medications Dosing Guide"  in media tab for additional information. Please  also refer to " Patient Self Inventory" in the Media  tab for reviewed elements of pertinent patient history.  [name] participated in the discussions, expressed understanding, and voiced agreement with the above plans.  All questions were answered to her satisfaction. she is encouraged to contact clinic should she have any questions or concerns prior to her return visit.     Follow up plan: - Return in about 6 months (around 11/28/2023) for A1c -NV.  Marquis Lunch, MD [facility] [address] [place], [place] [address] Phone: [phone]  Fax: [phone]    05/29/2023, 5:42 PM  This note was partially dictated with voice recognition software. Similar sounding words can be transcribed inadequately or may not  be corrected upon review.

## 2023-05-30 ENCOUNTER — Other Ambulatory Visit: Payer: Self-pay | Admitting: Radiology

## 2023-05-30 DIAGNOSIS — Z7985 Long-term (current) use of injectable non-insulin antidiabetic drugs: Secondary | ICD-10-CM | POA: Insufficient documentation

## 2023-05-30 DIAGNOSIS — G5601 Carpal tunnel syndrome, right upper limb: Secondary | ICD-10-CM

## 2023-05-30 DIAGNOSIS — Z01818 Encounter for other preprocedural examination: Secondary | ICD-10-CM

## 2023-06-06 ENCOUNTER — Telehealth: Payer: Self-pay

## 2023-06-06 NOTE — Telephone Encounter (Signed)
She will call me back when she is ready to rs

## 2023-06-06 NOTE — Telephone Encounter (Signed)
Dr. Romeo Apple pt-------------------Patient left message stating that she needs to cancel her surgery until further notice she has some family matters that have come up.  She would like a return call @ [phone]

## 2023-06-12 ENCOUNTER — Ambulatory Visit: Admit: 2023-06-12 | Payer: 59 | Admitting: Orthopedic Surgery

## 2023-06-12 SURGERY — CARPAL TUNNEL RELEASE
Anesthesia: Choice | Laterality: Right

## 2023-06-14 ENCOUNTER — Other Ambulatory Visit (HOSPITAL_COMMUNITY): Payer: Self-pay

## 2023-06-14 ENCOUNTER — Other Ambulatory Visit: Payer: Self-pay

## 2023-06-17 ENCOUNTER — Other Ambulatory Visit: Payer: Self-pay

## 2023-06-17 ENCOUNTER — Encounter: Payer: Self-pay | Admitting: Internal Medicine

## 2023-06-17 ENCOUNTER — Other Ambulatory Visit (HOSPITAL_COMMUNITY): Payer: Self-pay

## 2023-06-17 ENCOUNTER — Ambulatory Visit (INDEPENDENT_AMBULATORY_CARE_PROVIDER_SITE_OTHER): Payer: 59 | Admitting: Internal Medicine

## 2023-06-17 VITALS — BP 110/75 | HR 95 | Ht <= 58 in | Wt 173.0 lb

## 2023-06-17 DIAGNOSIS — E538 Deficiency of other specified B group vitamins: Secondary | ICD-10-CM | POA: Diagnosis not present

## 2023-06-17 DIAGNOSIS — I10 Essential (primary) hypertension: Secondary | ICD-10-CM

## 2023-06-17 DIAGNOSIS — F5101 Primary insomnia: Secondary | ICD-10-CM

## 2023-06-17 DIAGNOSIS — E1169 Type 2 diabetes mellitus with other specified complication: Secondary | ICD-10-CM | POA: Diagnosis not present

## 2023-06-17 DIAGNOSIS — Z6838 Body mass index (BMI) 38.0-38.9, adult: Secondary | ICD-10-CM

## 2023-06-17 DIAGNOSIS — E782 Mixed hyperlipidemia: Secondary | ICD-10-CM

## 2023-06-17 DIAGNOSIS — Z794 Long term (current) use of insulin: Secondary | ICD-10-CM

## 2023-06-17 DIAGNOSIS — K219 Gastro-esophageal reflux disease without esophagitis: Secondary | ICD-10-CM

## 2023-06-17 MED ORDER — OMEPRAZOLE 40 MG PO CPDR: 40.0000 mg | DELAYED_RELEASE_CAPSULE | Freq: Every day | ORAL | 1 refills | Status: DC

## 2023-06-17 MED ORDER — CYANOCOBALAMIN 1000 MCG/ML IJ SOLN
1000.0000 ug | Freq: Once | INTRAMUSCULAR | Status: AC
Start: 2023-06-17 — End: 2023-06-17
  Administered 2023-06-17: 1000 ug via INTRAMUSCULAR

## 2023-06-17 NOTE — Assessment & Plan Note (Signed)
Her epigastric discomfort, excessive burping and mild nausea could be due to gastritis/GERD Started omeprazole 40 mg every day - she has taken 20 mg dose in the past with suboptimal response Avoid hot and spicy food

## 2023-06-17 NOTE — Assessment & Plan Note (Signed)
Diet modification and moderate exercise as tolerated on GLP-1 agonist - has lost 5 lbs since the last visit

## 2023-06-17 NOTE — Assessment & Plan Note (Addendum)
DC Vistaril as her dry mouth is likely due to it Has been on Ativan and Xanax in the past On Ambien 5 mg at bedtime, needs to get refill Sleep hygiene discussed

## 2023-06-17 NOTE — Assessment & Plan Note (Signed)
Lab Results  Component Value Date   HGBA1C 6.5 05/29/2023   Well controlled Associated with diabetic neuropathy, HTN and HLD followed by Dr. Fransico Him now On Trulicity and Kirk Ruths Advised to follow diabetic diet On statin and ACEi F/u CMP and lipid panel Diabetic eye exam: Advised to follow up with Ophthalmology for diabetic eye exam

## 2023-06-17 NOTE — Assessment & Plan Note (Signed)
B12 injection today 

## 2023-06-17 NOTE — Patient Instructions (Addendum)
Please start taking Omeprazole as prescribed.  Please stop taking Hydroxyzine and start taking Ambien for insomnia.  Please continue to take medications as prescribed.  Please continue to follow low carb diet and perform moderate exercise/walking at least 150 mins/week.

## 2023-06-17 NOTE — Assessment & Plan Note (Signed)
BP Readings from Last 1 Encounters:  06/17/23 110/75   Well-controlled with Lisinopril-HCTZ 20-12.5 mg QD Counseled for compliance with the medications Advised DASH diet and moderate exercise/walking as tolerated

## 2023-06-17 NOTE — Progress Notes (Signed)
Established Patient Office Visit  Subjective:  Patient ID: [name], female    DOB: [birth year]  Age: 57 y.o. MRN: [id number]  CC:  Chief Complaint  Patient presents with   Diabetes    Four month follow up   Diarrhea    Patient has had diarrhea for a couple days now    no appetite    Patient does not have an appetite nor any energy     HPI [name] is a 57 y.o. female with past medical history of DM, HTN, OSA, lumbar spinal stenosis and insomnia who presents for f/u of her chronic medical conditions.  HTN: BP is well-controlled. Takes medications regularly. Patient denies headache, dizziness, chest pain, dyspnea or palpitations.   Type II DM: She has been taking Synjardy and Trulicity.  Her HbA1c has improved to 6.5 now.  She states that her home blood glucose has been ranging around 130.  She has lost about 5 pounds since the last visit in 05/24 with Trulicity now.  She denies any polyuria, polydipsia or polyphagia.  She still complains of insomnia despite taking Vistaril.  She has difficulty initiating and maintaining sleep.  She used to take BZD in the past.  She was given Ambien in the last visit, but has not been able to refill it recently.  She also reports dry mouth, which is likely due to Vistaril.  She has had cystoscopy with stent placement for retrieval of recurrent nephrolithiasis.  Followed by urology.  She had diarrhea for 2 days, but her last BM was yesterday and was semisolid.  She denies any fever or chills.  She reports chronic epigastric discomfort, excessive burping with foul smell and lack of appetite due to it.  She was given omeprazole in the past for GERD, but has not been taking it recently.  Past Medical History:  Diagnosis Date   Diabetes mellitus    insulin pump 2013   Dysphagia 03/09/2020   Epidural hematoma (HCC) 12/14/2021   GERD (gastroesophageal reflux disease)    History of kidney stones    Hypercholesteremia    Hypertension     Incarcerated umbilical hernia 03/25/2014   Kidney stone    Kidney stones 08/31/2008   Qualifier: Diagnosis of  By: Erby Pian MD, Cornelius     Neuropathy    Obesity    Palpitations    Shortness of breath    Vertigo     Past Surgical History:  Procedure Laterality Date   BIOPSY  06/23/2020   Procedure: BIOPSY;  Surgeon: Corbin Ade, MD;  [facility];  Service: Endoscopy;;   CARDIAC CATHETERIZATION  12/11/2012   normal coronary arteries   CESAREAN SECTION  1994;2000   [facility]   CHOLECYSTECTOMY  1992   COLONOSCOPY WITH PROPOFOL N/A 06/23/2020   large amount of stool in entire colon, prep inadequate.    CYSTOSCOPY W/ URETERAL STENT PLACEMENT  05/08/2012   Procedure: CYSTOSCOPY WITH RETROGRADE PYELOGRAM/URETERAL STENT PLACEMENT;  Surgeon: Ky Barban, MD;  Location: [facility];  Service: Urology;  Laterality: Right;   CYSTOSCOPY WITH RETROGRADE PYELOGRAM, URETEROSCOPY AND STENT PLACEMENT Right 05/04/2020   Procedure: CYSTOSCOPY WITH RIGHT RETROGRADE PYELOGRAM, RIGHT URETEROSCOPY AND RIGHT URETERAL STENT EXCHANGE;  Surgeon: Malen Gauze, MD;  Location: [facility];  Service: Urology;  Laterality: Right;   CYSTOSCOPY WITH RETROGRADE PYELOGRAM, URETEROSCOPY AND STENT PLACEMENT Left 08/08/2020   Procedure: CYSTOSCOPY WITH RETROGRADE PYELOGRAM, URETEROSCOPY WITH LASER  AND STENT PLACEMENT;  Surgeon: Wilkie Aye  L, MD;  Location: [facility];  Service: Urology;  Laterality: Left;   CYSTOSCOPY WITH RETROGRADE PYELOGRAM, URETEROSCOPY AND STENT PLACEMENT Right 11/08/2020   Procedure: CYSTOSCOPY WITH RIGHT RETROGRADE PYELOGRAM, RIGHT URETEROSCOPY AND STENT PLACEMENT;  Surgeon: Malen Gauze, MD;  Location: [facility];  Service: Urology;  Laterality: Right;   CYSTOSCOPY WITH RETROGRADE PYELOGRAM, URETEROSCOPY AND STENT PLACEMENT Left 12/27/2020   Procedure: CYSTOSCOPY WITH LEFT RETROGRADE PYELOGRAM, LEFT URETEROSCOPY AND LEFT URETERAL STENT PLACEMENT;  Surgeon: Malen Gauze, MD;   Location: [facility];  Service: Urology;  Laterality: Left;   CYSTOSCOPY WITH RETROGRADE PYELOGRAM, URETEROSCOPY AND STENT PLACEMENT Bilateral 03/16/2021   Procedure: CYSTOSCOPY WITH BILATERAL  RETROGRADE PYELOGRAM, BILATERAL URETEROSCOPY AND STENT PLACEMENT ON THE LEFT;  Surgeon: Malen Gauze, MD;  Location: [facility];  Service: Urology;  Laterality: Bilateral;   CYSTOSCOPY WITH RETROGRADE PYELOGRAM, URETEROSCOPY AND STENT PLACEMENT Left 06/28/2022   Procedure: CYSTOSCOPY WITH RETROGRADE PYELOGRAM, URETEROSCOPY AND STENT PLACEMENT;  Surgeon: Malen Gauze, MD;  Location: [facility];  Service: Urology;  Laterality: Left;   CYSTOSCOPY WITH RETROGRADE PYELOGRAM, URETEROSCOPY AND STENT PLACEMENT Bilateral 09/27/2022   Procedure: CYSTOSCOPY WITH BILATERAL RETROGRADE PYELOGRAM, URETEROSCOPY AND STENT PLACEMENT;  Surgeon: Malen Gauze, MD;  Location: [facility];  Service: Urology;  Laterality: Bilateral;  pt knows to arrive at 6:00   CYSTOSCOPY WITH STENT PLACEMENT Right 02/25/2020   Procedure: CYSTOSCOPY WITH RIGHT RETROGRADE RIGHT STENT PLACEMENT;  Surgeon: Jerilee Field, MD;  Location: [facility];  Service: Urology;  Laterality: Right;   ESOPHAGOGASTRODUODENOSCOPY (EGD) WITH PROPOFOL N/A 06/23/2020   Normal esophagus, multiple localized erosions were found in gastric antrum s/p biopsy, normal duodenum. Empiric dilation.Reactive gastropathy with erosions, negative H.pylori, no dysplasia.     EXTRACORPOREAL SHOCK WAVE LITHOTRIPSY Right 10/11/2020   Procedure: EXTRACORPOREAL SHOCK WAVE LITHOTRIPSY (ESWL);  Surgeon: Malen Gauze, MD;  Location: [facility];  Service: Urology;  Laterality: Right;   FOOT SURGERY Right March, 2013   [facility]-removal of bone spur   HOLMIUM LASER APPLICATION  05/04/2020   Procedure: HOLMIUM LASER LITHOTRIPSY RIGHT URETERAL CALCULUS;  Surgeon: Malen Gauze, MD;  Location: [facility];  Service: Urology;;   HYSTEROSCOPY WITH THERMACHOICE  04/25/2012   Procedure:  HYSTEROSCOPY WITH THERMACHOICE;  Surgeon: Lazaro Arms, MD;  Location: [facility];  Service: Gynecology;  Laterality: N/A;  total therapy time= 11 minutes 42 seconds; 34 ml D5w  in and 34 ml D5w out; temp =87 degrees F   LEFT HEART CATHETERIZATION WITH CORONARY ANGIOGRAM N/A 12/11/2012   Procedure: LEFT HEART CATHETERIZATION WITH CORONARY ANGIOGRAM;  Surgeon: Runell Gess, MD;  Location: [facility] CATH LAB;  Service: Cardiovascular;  Laterality: N/A;   LUMBAR WOUND DEBRIDEMENT N/A 12/14/2021   Procedure: LUMBAR HEMATOMA EVACUATION;  Surgeon: Jadene Pierini, MD;  Location: [facility];  Service: Neurosurgery;  Laterality: N/A;   MALONEY DILATION N/A 06/23/2020   Procedure: [name] DILATION;  Surgeon: Corbin Ade, MD;  [facility];  Service: Endoscopy;  Laterality: N/A;   Sinus sergery  1988   [place]   STONE EXTRACTION WITH BASKET Right 11/08/2020   Procedure: STONE EXTRACTION WITH BASKET;  Surgeon: Malen Gauze, MD;  Location: [facility];  Service: Urology;  Laterality: Right;   TRANSFORAMINAL LUMBAR INTERBODY FUSION (TLIF) WITH PEDICLE SCREW FIXATION 1 LEVEL N/A 12/05/2021   Procedure: Open Lumbar five-Sacral one Laminectomy/Transforaminal Lumbar Interbody Fusion/Posterolateral instrumented fusion;  Surgeon: Jadene Pierini, MD;  Location: [facility];  Service: Neurosurgery;  Laterality: N/A;  3C   UMBILICAL HERNIA REPAIR N/A 03/25/2014   Procedure: UMBILICAL HERNIA REPAIR;  Surgeon: Marlane Hatcher, MD;  Location: [facility];  Service: General;  Laterality: N/A;   uterine ablation      Family History  Problem Relation Age of Onset   Depression Paternal Grandfather    Depression Paternal Grandmother    Diabetes Father    Hypertension Mother    Cancer Mother    Diabetes Mother    Stroke Mother    Alzheimer's disease Mother    Cancer Paternal Uncle    Heart disease Other    Cancer Other    Diabetes Other    Achalasia Other    Anesthesia problems Neg Hx    Hypotension Neg Hx     Malignant hyperthermia Neg Hx    Pseudochol deficiency Neg Hx    Colon cancer Neg Hx    Colon polyps Neg Hx     Social History   Socioeconomic History   Marital status: Married    Spouse name: Not on file   Number of children: 2   Years of education: college   Highest education level: Some college, no degree  Occupational History   Occupation: Scientist, research (medical): Audiological scientist: [employer]   Occupation: Lawyer- at [employer]  Tobacco Use   Smoking status: Never   Smokeless tobacco: Never  Building services engineer Use: Never used  Substance and Sexual Activity   Alcohol use: No   Drug use: No   Sexual activity: Yes    Partners: Male    Birth control/protection: Post-menopausal    Comment: ablation  Other Topics Concern   Not on file  Social History Narrative   Regular exercise: walks Caffeine use:    Social Determinants of Health   Financial Resource Strain: Low Risk  (04/12/2023)   Overall Financial Resource Strain (CARDIA)    Difficulty of Paying Living Expenses: Not hard at all  Food Insecurity: No Food Insecurity (04/12/2023)   Hunger Vital Sign    Worried About Running Out of Food in the Last Year: Never true    Ran Out of Food in the Last Year: Never true  Transportation Needs: No Transportation Needs (04/12/2023)   PRAPARE - Administrator, Civil Service (Medical): No    Lack of Transportation (Non-Medical): No  Physical Activity: Insufficiently Active (04/12/2023)   Exercise Vital Sign    Days of Exercise per Week: 2 days    Minutes of Exercise per Session: 10 min  Stress: Stress Concern Present (04/12/2023)   [employer] of Occupational Health - Occupational Stress Questionnaire    Feeling of Stress : To some extent  Social Connections: Moderately Integrated (04/12/2023)   Social Connection and Isolation Panel [NHANES]    Frequency of Communication with Friends and Family: More than three times a week    Frequency of Social  Gatherings with Friends and Family: Once a week    Attends Religious Services: 1 to 4 times per year    Active Member of [employer] or Organizations: No    Attends Banker Meetings: Never    Marital Status: Married  Catering manager Violence: Not At Risk (01/31/2023)   Humiliation, Afraid, Rape, and Kick questionnaire    Fear of Current or Ex-Partner: No    Emotionally Abused: No    Physically Abused: No    Sexually Abused: No    Outpatient Medications Prior to  Visit  Medication Sig Dispense Refill   albuterol (VENTOLIN HFA) 108 (90 Base) MCG/ACT inhaler Inhale 2 puffs into the lungs every 6 (six) hours as needed for wheezing or shortness of breath.      blood glucose meter kit and supplies Use up to four times daily as directed. 1 each 0   cyclobenzaprine (FLEXERIL) 10 MG tablet Take 1 tablet (10 mg total) by mouth 3 (three) times daily as needed for muscle spasms. 30 tablet 0   Dulaglutide (TRULICITY) 4.5 MG/0.5ML SOPN Inject 4.5 mg as directed once a week. 6 mL 1   Empagliflozin-metFORMIN HCl (SYNJARDY) 12.5-500 MG TABS Take 1 tablet by mouth daily. 90 tablet 0   furosemide (LASIX) 20 MG tablet Take 20 mg by mouth 2 (two) times daily as needed for edema.     gabapentin (NEURONTIN) 600 MG tablet Take 1 tablet (600 mg total) by mouth 3 (three) times daily. 90 tablet 2   glucose blood test strip Test up to 4 times a day 100 each 0   Lancets (FREESTYLE) lancets Use to test 4 times daily 100 each 0   lisinopril-hydrochlorothiazide (ZESTORETIC) 20-12.5 MG tablet Take 1 tablet by mouth daily. 90 tablet 3   meclizine (ANTIVERT) 25 MG tablet TAKE 1 TABLET(25 MG) BY MOUTH THREE TIMES DAILY AS NEEDED FOR DIZZINESS OR NAUSEA 30 tablet 0   meloxicam (MOBIC) 7.5 MG tablet Take 1 tablet (7.5 mg total) by mouth daily. 30 tablet 5   ondansetron (ZOFRAN) 4 MG tablet Take 1 tablet (4 mg total) by mouth daily as needed for nausea or vomiting. 30 tablet 1   oxyCODONE-acetaminophen (PERCOCET/ROXICET)  5-325 MG tablet Take 1 tablet by mouth every 8 (eight) hours as needed for severe pain. 30 tablet 0   Potassium Citrate (UROCIT-K 15) 15 MEQ (1620 MG) TBCR Take 1 tablet by mouth in the morning and at bedtime. 60 tablet 11   promethazine (PHENERGAN) 25 MG tablet TAKE 1 TABLET(25 MG) BY MOUTH EVERY 8 HOURS AS NEEDED FOR NAUSEA OR VOMITING 20 tablet 0   simvastatin (ZOCOR) 40 MG tablet Take 1 tablet (40 mg total) by mouth every evening for cholesterol 90 tablet 3   zolpidem (AMBIEN) 5 MG tablet Take 1 tablet (5 mg total) by mouth at bedtime as needed for sleep. 30 tablet 2   hydrOXYzine (VISTARIL) 50 MG capsule Take 1 capsule (50 mg total) by mouth daily as needed for anxiety (Insomnia). 30 capsule 0   omeprazole (PRILOSEC) 20 MG capsule Take 1 capsule (20 mg total) by mouth daily. 90 capsule 1   No facility-administered medications prior to visit.    Allergies  Allergen Reactions   Ciprofloxacin Hives and Swelling   Penicillins Anaphylaxis and Rash   Codeine Other (See Comments)    had hallicunations   Morphine Nausea And Vomiting and Other (See Comments)    recieved in ED due to [facility] and had multple doses - gave visual hallucinations and vomitting.   Sulfonamide Derivatives Other (See Comments)    REACTION: Unsure - childhood allergy   Vancomycin Itching    ROS Review of Systems  Constitutional:  Positive for fatigue. Negative for chills and fever.  HENT:  Negative for congestion, sinus pressure, sinus pain and sore throat.   Eyes:  Negative for pain and discharge.  Respiratory:  Negative for cough and shortness of breath.   Cardiovascular:  Negative for chest pain and palpitations.  Gastrointestinal:  Positive for diarrhea. Negative for abdominal pain, nausea and vomiting.  Endocrine: Negative for polydipsia and polyuria.  Genitourinary:  Negative for dysuria and hematuria.  Musculoskeletal:  Positive for back pain. Negative for neck pain and neck stiffness.  Skin:   Negative for rash.  Neurological:  Negative for dizziness and weakness.  Psychiatric/Behavioral:  Positive for sleep disturbance. Negative for agitation and behavioral problems.       Objective:    Physical Exam Vitals reviewed.  Constitutional:      General: She is not in acute distress.    Appearance: She is obese. She is not diaphoretic.  HENT:     Head: Normocephalic and atraumatic.     Mouth/Throat:     Mouth: Mucous membranes are moist.     Pharynx: No posterior oropharyngeal erythema.  Eyes:     General: No scleral icterus.    Extraocular Movements: Extraocular movements intact.  Cardiovascular:     Rate and Rhythm: Normal rate and regular rhythm.     Pulses: Normal pulses.     Heart sounds: Normal heart sounds. No murmur heard. Pulmonary:     Breath sounds: Normal breath sounds. No wheezing or rales.  Abdominal:     Palpations: Abdomen is soft.     Tenderness: There is no abdominal tenderness.  Musculoskeletal:     Cervical back: Neck supple. No tenderness.     Right lower leg: No edema.     Left lower leg: No edema.  Skin:    General: Skin is warm.     Findings: No rash.  Neurological:     General: No focal deficit present.     Mental Status: She is alert and oriented to person, place, and time.  Psychiatric:        Mood and Affect: Mood normal.        Behavior: Behavior normal.     BP 110/75 (BP Location: Right Arm, Patient Position: Sitting, Cuff Size: Normal)   Pulse 95   Ht 4\' 8"  (1.422 m)   Wt 173 lb (78.5 kg)   LMP 07/16/2012   SpO2 94%   BMI 38.79 kg/m  Wt Readings from Last 3 Encounters:  06/17/23 173 lb (78.5 kg)  05/29/23 176 lb 9.6 oz (80.1 kg)  05/13/23 178 lb (80.7 kg)    Lab Results  Component Value Date   TSH 1.980 05/28/2023   Lab Results  Component Value Date   WBC 7.8 07/02/2022   HGB 14.2 07/02/2022   HCT 43.9 07/02/2022   MCV 85.4 07/02/2022   PLT 302 07/02/2022   Lab Results  Component Value Date   NA 140  05/28/2023   K 4.3 05/28/2023   CO2 28 05/28/2023   GLUCOSE 119 (H) 05/28/2023   BUN 14 05/28/2023   CREATININE 0.73 05/28/2023   BILITOT 0.5 05/28/2023   ALKPHOS 118 05/28/2023   AST 26 05/28/2023   ALT 22 05/28/2023   PROT 6.9 05/28/2023   ALBUMIN 4.4 05/28/2023   CALCIUM 9.4 05/28/2023   ANIONGAP 8 09/25/2022   EGFR 96 05/28/2023   Lab Results  Component Value Date   CHOL 144 05/28/2023   Lab Results  Component Value Date   HDL 50 05/28/2023   Lab Results  Component Value Date   LDLCALC 57 05/28/2023   Lab Results  Component Value Date   TRIG 230 (H) 05/28/2023   Lab Results  Component Value Date   CHOLHDL 2.9 05/28/2023   Lab Results  Component Value Date   HGBA1C 6.5 05/29/2023  Assessment & Plan:   Problem List Items Addressed This Visit    Essential hypertension, benign BP Readings from Last 1 Encounters:  06/17/23 110/75   Well-controlled with Lisinopril-HCTZ 20-12.5 mg QD Counseled for compliance with the medications Advised DASH diet and moderate exercise/walking as tolerated  Diabetes mellitus (HCC) Lab Results  Component Value Date   HGBA1C 6.5 05/29/2023   Well controlled Associated with diabetic neuropathy, HTN and HLD followed by Dr. Fransico Him now On Trulicity and Synjardy Advised to follow diabetic diet On statin and ACEi F/u CMP and lipid panel Diabetic eye exam: Advised to follow up with Ophthalmology for diabetic eye exam  Primary insomnia DC Vistaril as her dry mouth is likely due to it Has been on Ativan and Xanax in the past On Ambien 5 mg at bedtime, needs to get refill Sleep hygiene discussed  Class 2 severe obesity due to excess calories with serious comorbidity and body mass index (BMI) of 38.0 to 38.9 in adult [facility]) Diet modification and moderate exercise as tolerated on GLP-1 agonist - has lost 5 lbs since the last visit  GERD (gastroesophageal reflux disease) Her epigastric discomfort, excessive burping and  mild nausea could be due to gastritis/GERD Started omeprazole 40 mg every day - she has taken 20 mg dose in the past with suboptimal response Avoid hot and spicy food  B12 deficiency B12 injection today   Meds ordered this encounter  Medications   omeprazole (PRILOSEC) 40 MG capsule    Sig: Take 1 capsule (40 mg total) by mouth daily.    Dispense:  90 capsule    Refill:  1   cyanocobalamin (VITAMIN B12) injection 1,000 mcg    Follow-up: Return in about 4 months (around 10/18/2023) for DM and GERD.    Anabel Halon, MD

## 2023-06-18 ENCOUNTER — Other Ambulatory Visit: Payer: Self-pay

## 2023-06-24 ENCOUNTER — Telehealth: Payer: Self-pay | Admitting: Internal Medicine

## 2023-06-24 ENCOUNTER — Other Ambulatory Visit: Payer: Self-pay | Admitting: Internal Medicine

## 2023-06-24 DIAGNOSIS — K529 Noninfective gastroenteritis and colitis, unspecified: Secondary | ICD-10-CM

## 2023-06-24 DIAGNOSIS — R197 Diarrhea, unspecified: Secondary | ICD-10-CM

## 2023-06-24 MED ORDER — ONDANSETRON HCL 4 MG PO TABS: 4.0000 mg | ORAL_TABLET | Freq: Every day | ORAL | 1 refills | Status: DC | PRN

## 2023-06-24 NOTE — Telephone Encounter (Signed)
 Patient advised.

## 2023-06-24 NOTE — Telephone Encounter (Signed)
Patient called just seen Dr Allena Katz last week and still feeling nausea and diarrhea  what is the next step for the patient. Patient could not go to work today call back # [phone]

## 2023-06-24 NOTE — Telephone Encounter (Signed)
 Spoke to patient

## 2023-06-28 ENCOUNTER — Other Ambulatory Visit (HOSPITAL_COMMUNITY): Payer: Self-pay | Admitting: Adult Health

## 2023-06-28 DIAGNOSIS — Z1231 Encounter for screening mammogram for malignant neoplasm of breast: Secondary | ICD-10-CM

## 2023-07-04 ENCOUNTER — Ambulatory Visit (HOSPITAL_COMMUNITY)
Admission: RE | Admit: 2023-07-04 | Discharge: 2023-07-04 | Disposition: A | Discharge status: Home / Self Care | Payer: 59 | Source: Ambulatory Visit | Attending: Adult Health | Admitting: Adult Health

## 2023-07-04 ENCOUNTER — Encounter (HOSPITAL_COMMUNITY): Payer: Self-pay

## 2023-07-04 DIAGNOSIS — Z1231 Encounter for screening mammogram for malignant neoplasm of breast: Secondary | ICD-10-CM | POA: Diagnosis not present

## 2023-07-10 ENCOUNTER — Other Ambulatory Visit: Payer: Self-pay | Admitting: Internal Medicine

## 2023-07-10 ENCOUNTER — Other Ambulatory Visit (HOSPITAL_COMMUNITY): Payer: Self-pay

## 2023-07-10 MED ORDER — GABAPENTIN 600 MG PO TABS
600.0000 mg | ORAL_TABLET | Freq: Three times a day (TID) | ORAL | 2 refills | Status: DC
  Filled 2023-07-10: qty 90, 30d supply, fill #0
  Filled 2023-08-19: qty 90, 30d supply, fill #1
  Filled 2023-09-23: qty 90, 30d supply, fill #2

## 2023-07-11 ENCOUNTER — Other Ambulatory Visit: Payer: Self-pay

## 2023-07-13 ENCOUNTER — Other Ambulatory Visit (HOSPITAL_COMMUNITY): Payer: Self-pay

## 2023-07-13 ENCOUNTER — Other Ambulatory Visit: Payer: Self-pay | Admitting: Internal Medicine

## 2023-07-13 DIAGNOSIS — R42 Dizziness and giddiness: Secondary | ICD-10-CM

## 2023-07-15 ENCOUNTER — Other Ambulatory Visit: Payer: Self-pay

## 2023-07-15 MED ORDER — MECLIZINE HCL 25 MG PO TABS
25.0000 mg | ORAL_TABLET | Freq: Two times a day (BID) | ORAL | 0 refills | Status: DC | PRN
  Filled 2023-07-15: qty 30, 15d supply, fill #0

## 2023-07-16 ENCOUNTER — Other Ambulatory Visit: Payer: Self-pay | Admitting: Urology

## 2023-07-16 ENCOUNTER — Other Ambulatory Visit: Payer: Self-pay | Admitting: Internal Medicine

## 2023-07-16 DIAGNOSIS — N200 Calculus of kidney: Secondary | ICD-10-CM

## 2023-07-16 DIAGNOSIS — R109 Unspecified abdominal pain: Secondary | ICD-10-CM

## 2023-07-16 DIAGNOSIS — F5101 Primary insomnia: Secondary | ICD-10-CM

## 2023-07-17 ENCOUNTER — Other Ambulatory Visit (HOSPITAL_COMMUNITY): Payer: Self-pay

## 2023-07-17 ENCOUNTER — Other Ambulatory Visit: Payer: Self-pay

## 2023-07-17 MED ORDER — ZOLPIDEM TARTRATE 5 MG PO TABS
5.0000 mg | ORAL_TABLET | Freq: Every evening | ORAL | 2 refills | Status: DC | PRN
  Filled 2023-07-17 – 2023-08-19 (×2): qty 30, 30d supply, fill #0
  Filled 2023-09-23: qty 30, 30d supply, fill #1
  Filled 2023-10-24: qty 30, 30d supply, fill #2

## 2023-07-18 ENCOUNTER — Other Ambulatory Visit (HOSPITAL_COMMUNITY): Payer: Self-pay

## 2023-07-26 ENCOUNTER — Other Ambulatory Visit (HOSPITAL_COMMUNITY): Payer: Self-pay

## 2023-08-19 ENCOUNTER — Other Ambulatory Visit: Payer: Self-pay | Admitting: Urology

## 2023-08-19 ENCOUNTER — Other Ambulatory Visit (HOSPITAL_COMMUNITY): Payer: Self-pay

## 2023-08-19 ENCOUNTER — Other Ambulatory Visit: Payer: Self-pay

## 2023-08-19 DIAGNOSIS — R109 Unspecified abdominal pain: Secondary | ICD-10-CM

## 2023-08-19 DIAGNOSIS — N200 Calculus of kidney: Secondary | ICD-10-CM

## 2023-08-20 ENCOUNTER — Other Ambulatory Visit: Payer: Self-pay

## 2023-08-20 ENCOUNTER — Other Ambulatory Visit (HOSPITAL_COMMUNITY): Payer: Self-pay

## 2023-08-20 MED ORDER — CYCLOBENZAPRINE HCL 10 MG PO TABS
10.0000 mg | ORAL_TABLET | Freq: Three times a day (TID) | ORAL | 0 refills | Status: DC | PRN
  Filled 2023-08-20: qty 30, 10d supply, fill #0

## 2023-08-21 ENCOUNTER — Other Ambulatory Visit (HOSPITAL_COMMUNITY): Payer: Self-pay

## 2023-09-06 ENCOUNTER — Other Ambulatory Visit (HOSPITAL_COMMUNITY): Payer: Self-pay

## 2023-09-23 ENCOUNTER — Other Ambulatory Visit: Payer: Self-pay | Admitting: Orthopedic Surgery

## 2023-09-23 ENCOUNTER — Other Ambulatory Visit: Payer: Self-pay | Admitting: Urology

## 2023-09-23 ENCOUNTER — Other Ambulatory Visit: Payer: Self-pay

## 2023-09-23 ENCOUNTER — Other Ambulatory Visit: Payer: Self-pay | Admitting: Internal Medicine

## 2023-09-23 ENCOUNTER — Other Ambulatory Visit (HOSPITAL_COMMUNITY): Payer: Self-pay

## 2023-09-23 DIAGNOSIS — R200 Anesthesia of skin: Secondary | ICD-10-CM

## 2023-09-23 DIAGNOSIS — R109 Unspecified abdominal pain: Secondary | ICD-10-CM

## 2023-09-23 DIAGNOSIS — F5101 Primary insomnia: Secondary | ICD-10-CM

## 2023-09-23 DIAGNOSIS — N200 Calculus of kidney: Secondary | ICD-10-CM

## 2023-09-23 DIAGNOSIS — G5601 Carpal tunnel syndrome, right upper limb: Secondary | ICD-10-CM

## 2023-09-23 MED ORDER — MELOXICAM 7.5 MG PO TABS
7.5000 mg | ORAL_TABLET | Freq: Every day | ORAL | 5 refills | Status: DC
Start: 2023-09-23 — End: 2024-02-14
  Filled 2023-09-23: qty 30, 30d supply, fill #0
  Filled 2023-10-24: qty 30, 30d supply, fill #1
  Filled 2023-11-25: qty 30, 30d supply, fill #2
  Filled 2024-01-01: qty 30, 30d supply, fill #3
  Filled 2024-01-24: qty 30, 30d supply, fill #4
  Filled ????-??-??: fill #3

## 2023-09-24 ENCOUNTER — Other Ambulatory Visit (HOSPITAL_COMMUNITY): Payer: Self-pay

## 2023-09-24 ENCOUNTER — Other Ambulatory Visit: Payer: Self-pay

## 2023-09-24 MED ORDER — CYCLOBENZAPRINE HCL 10 MG PO TABS
10.0000 mg | ORAL_TABLET | Freq: Three times a day (TID) | ORAL | 0 refills | Status: DC | PRN
  Filled 2023-09-24: qty 30, 10d supply, fill #0

## 2023-10-02 ENCOUNTER — Ambulatory Visit (HOSPITAL_COMMUNITY): Payer: 59

## 2023-10-03 ENCOUNTER — Ambulatory Visit (HOSPITAL_COMMUNITY): Payer: 59

## 2023-10-03 ENCOUNTER — Ambulatory Visit (HOSPITAL_COMMUNITY)
Admission: RE | Admit: 2023-10-03 | Discharge: 2023-10-03 | Disposition: A | Discharge status: Home / Self Care | Payer: 59 | Source: Ambulatory Visit | Attending: Urology | Admitting: Urology

## 2023-10-03 DIAGNOSIS — R109 Unspecified abdominal pain: Secondary | ICD-10-CM | POA: Diagnosis not present

## 2023-10-03 DIAGNOSIS — N200 Calculus of kidney: Secondary | ICD-10-CM | POA: Insufficient documentation

## 2023-10-04 ENCOUNTER — Ambulatory Visit (HOSPITAL_COMMUNITY): Payer: 59

## 2023-10-17 ENCOUNTER — Encounter: Payer: Self-pay | Admitting: Internal Medicine

## 2023-10-17 ENCOUNTER — Ambulatory Visit (INDEPENDENT_AMBULATORY_CARE_PROVIDER_SITE_OTHER): Payer: 59 | Admitting: Internal Medicine

## 2023-10-17 VITALS — BP 115/75 | HR 93 | Ht <= 58 in | Wt 170.6 lb

## 2023-10-17 DIAGNOSIS — I10 Essential (primary) hypertension: Secondary | ICD-10-CM | POA: Diagnosis not present

## 2023-10-17 DIAGNOSIS — F3341 Major depressive disorder, recurrent, in partial remission: Secondary | ICD-10-CM | POA: Diagnosis not present

## 2023-10-17 DIAGNOSIS — K219 Gastro-esophageal reflux disease without esophagitis: Secondary | ICD-10-CM

## 2023-10-17 DIAGNOSIS — E1169 Type 2 diabetes mellitus with other specified complication: Secondary | ICD-10-CM

## 2023-10-17 DIAGNOSIS — R110 Nausea: Secondary | ICD-10-CM | POA: Diagnosis not present

## 2023-10-17 DIAGNOSIS — Z1211 Encounter for screening for malignant neoplasm of colon: Secondary | ICD-10-CM

## 2023-10-17 DIAGNOSIS — Z794 Long term (current) use of insulin: Secondary | ICD-10-CM | POA: Diagnosis not present

## 2023-10-17 DIAGNOSIS — E782 Mixed hyperlipidemia: Secondary | ICD-10-CM

## 2023-10-17 MED ORDER — CITALOPRAM HYDROBROMIDE 10 MG PO TABS
10.0000 mg | ORAL_TABLET | Freq: Every day | ORAL | 3 refills | Status: DC
Start: 2023-10-17 — End: 2024-03-20
  Filled 2024-01-01: qty 30, 30d supply, fill #0
  Filled 2024-01-24: qty 30, 30d supply, fill #1
  Filled 2024-02-21: qty 30, 30d supply, fill #2

## 2023-10-17 MED ORDER — ONDANSETRON HCL 4 MG PO TABS
4.0000 mg | ORAL_TABLET | Freq: Every day | ORAL | 1 refills | Status: DC | PRN
Start: 2023-10-17 — End: 2024-02-27

## 2023-10-17 NOTE — Progress Notes (Signed)
Established Patient Office Visit  Subjective:  Patient ID: [name], female    DOB: 16-Dec-1965  Age: 57 y.o. MRN: [id number]  CC:  Chief Complaint  Patient presents with   Diabetes    Follow up   Gastroesophageal Reflux    Follow up     HPI [name] is a 57 y.o. female with past medical history of DM, HTN, OSA, lumbar spinal stenosis and insomnia who presents for f/u of her chronic medical conditions.  HTN: BP is well-controlled. Takes medications regularly. Patient denies headache, dizziness, chest pain, dyspnea or palpitations.   Type II DM: She has been taking Synjardy and Trulicity.  Her HbA1c has improved to 6.5 now.  She states that her home blood glucose has been ranging around 130.  She has lost about 8 pounds since the last visit in 05/24 with Trulicity now.  She denies any polyuria, polydipsia or polyphagia.  She reports spells of anxiety, agitation and anger bursts at times.  She feels guilty that she bursts out at her husband. She feels low due to losing her mother in 2023. She was on Effexor in the past for MDD. She takes Ambien for insomnia. Denies SI or HI currently.  She has had cystoscopy with stent placement for retrieval of recurrent nephrolithiasis.  Followed by urology.  She reports chronic epigastric discomfort, excessive burping with foul smell and lack of appetite due to it.  She is taking omeprazole for GERD.  Of note, she takes Trulicity for type II DM.  Past Medical History:  Diagnosis Date   Diabetes mellitus    insulin pump 2013   Dysphagia 03/09/2020   Epidural hematoma (HCC) 12/14/2021   GERD (gastroesophageal reflux disease)    History of kidney stones    Hypercholesteremia    Hypertension    Incarcerated umbilical hernia 03/25/2014   Kidney stone    Kidney stones 08/31/2008   Qualifier: Diagnosis of  By: Erby Pian MD, Cornelius     Neuropathy    Obesity    Palpitations    Shortness of breath    Vertigo     Past Surgical  History:  Procedure Laterality Date   BIOPSY  06/23/2020   Procedure: BIOPSY;  Surgeon: Corbin Ade, MD;  [facility];  Service: Endoscopy;;   CARDIAC CATHETERIZATION  12/11/2012   normal coronary arteries   CESAREAN SECTION  1994;2000   [facility]   CHOLECYSTECTOMY  1992   COLONOSCOPY WITH PROPOFOL N/A 06/23/2020   large amount of stool in entire colon, prep inadequate.    CYSTOSCOPY W/ URETERAL STENT PLACEMENT  05/08/2012   Procedure: CYSTOSCOPY WITH RETROGRADE PYELOGRAM/URETERAL STENT PLACEMENT;  Surgeon: Ky Barban, MD;  Location: [facility];  Service: Urology;  Laterality: Right;   CYSTOSCOPY WITH RETROGRADE PYELOGRAM, URETEROSCOPY AND STENT PLACEMENT Right 05/04/2020   Procedure: CYSTOSCOPY WITH RIGHT RETROGRADE PYELOGRAM, RIGHT URETEROSCOPY AND RIGHT URETERAL STENT EXCHANGE;  Surgeon: Malen Gauze, MD;  Location: [facility];  Service: Urology;  Laterality: Right;   CYSTOSCOPY WITH RETROGRADE PYELOGRAM, URETEROSCOPY AND STENT PLACEMENT Left 08/08/2020   Procedure: CYSTOSCOPY WITH RETROGRADE PYELOGRAM, URETEROSCOPY WITH LASER  AND STENT PLACEMENT;  Surgeon: Malen Gauze, MD;  Location: [facility];  Service: Urology;  Laterality: Left;   CYSTOSCOPY WITH RETROGRADE PYELOGRAM, URETEROSCOPY AND STENT PLACEMENT Right 11/08/2020   Procedure: CYSTOSCOPY WITH RIGHT RETROGRADE PYELOGRAM, RIGHT URETEROSCOPY AND STENT PLACEMENT;  Surgeon: Malen Gauze, MD;  Location: [facility];  Service: Urology;  Laterality:  Right;   CYSTOSCOPY WITH RETROGRADE PYELOGRAM, URETEROSCOPY AND STENT PLACEMENT Left 12/27/2020   Procedure: CYSTOSCOPY WITH LEFT RETROGRADE PYELOGRAM, LEFT URETEROSCOPY AND LEFT URETERAL STENT PLACEMENT;  Surgeon: Malen Gauze, MD;  Location: [facility];  Service: Urology;  Laterality: Left;   CYSTOSCOPY WITH RETROGRADE PYELOGRAM, URETEROSCOPY AND STENT PLACEMENT Bilateral 03/16/2021   Procedure: CYSTOSCOPY WITH BILATERAL  RETROGRADE PYELOGRAM, BILATERAL URETEROSCOPY AND  STENT PLACEMENT ON THE LEFT;  Surgeon: Malen Gauze, MD;  Location: [facility];  Service: Urology;  Laterality: Bilateral;   CYSTOSCOPY WITH RETROGRADE PYELOGRAM, URETEROSCOPY AND STENT PLACEMENT Left 06/28/2022   Procedure: CYSTOSCOPY WITH RETROGRADE PYELOGRAM, URETEROSCOPY AND STENT PLACEMENT;  Surgeon: Malen Gauze, MD;  Location: [facility];  Service: Urology;  Laterality: Left;   CYSTOSCOPY WITH RETROGRADE PYELOGRAM, URETEROSCOPY AND STENT PLACEMENT Bilateral 09/27/2022   Procedure: CYSTOSCOPY WITH BILATERAL RETROGRADE PYELOGRAM, URETEROSCOPY AND STENT PLACEMENT;  Surgeon: Malen Gauze, MD;  Location: [facility];  Service: Urology;  Laterality: Bilateral;  pt knows to arrive at 6:00   CYSTOSCOPY WITH STENT PLACEMENT Right 02/25/2020   Procedure: CYSTOSCOPY WITH RIGHT RETROGRADE RIGHT STENT PLACEMENT;  Surgeon: Jerilee Field, MD;  Location: [facility];  Service: Urology;  Laterality: Right;   ESOPHAGOGASTRODUODENOSCOPY (EGD) WITH PROPOFOL N/A 06/23/2020   Normal esophagus, multiple localized erosions were found in gastric antrum s/p biopsy, normal duodenum. Empiric dilation.Reactive gastropathy with erosions, negative H.pylori, no dysplasia.     EXTRACORPOREAL SHOCK WAVE LITHOTRIPSY Right 10/11/2020   Procedure: EXTRACORPOREAL SHOCK WAVE LITHOTRIPSY (ESWL);  Surgeon: Malen Gauze, MD;  Location: [facility];  Service: Urology;  Laterality: Right;   FOOT SURGERY Right March, 2013   [facility]-removal of bone spur   HOLMIUM LASER APPLICATION  05/04/2020   Procedure: HOLMIUM LASER LITHOTRIPSY RIGHT URETERAL CALCULUS;  Surgeon: Malen Gauze, MD;  Location: [facility];  Service: Urology;;   HYSTEROSCOPY WITH THERMACHOICE  04/25/2012   Procedure: HYSTEROSCOPY WITH THERMACHOICE;  Surgeon: Lazaro Arms, MD;  Location: [facility];  Service: Gynecology;  Laterality: N/A;  total therapy time= 11 minutes 42 seconds; 34 ml D5w  in and 34 ml D5w out; temp =87 degrees F   LEFT HEART CATHETERIZATION  WITH CORONARY ANGIOGRAM N/A 12/11/2012   Procedure: LEFT HEART CATHETERIZATION WITH CORONARY ANGIOGRAM;  Surgeon: Runell Gess, MD;  Location: [facility];  Service: Cardiovascular;  Laterality: N/A;   LUMBAR WOUND DEBRIDEMENT N/A 12/14/2021   Procedure: LUMBAR HEMATOMA EVACUATION;  Surgeon: Jadene Pierini, MD;  Location: [facility];  Service: Neurosurgery;  Laterality: N/A;   MALONEY DILATION N/A 06/23/2020   Procedure: [name] DILATION;  Surgeon: Corbin Ade, MD;  [facility];  Service: Endoscopy;  Laterality: N/A;   Sinus sergery  1988   [place]   STONE EXTRACTION WITH BASKET Right 11/08/2020   Procedure: STONE EXTRACTION WITH BASKET;  Surgeon: Malen Gauze, MD;  Location: [facility];  Service: Urology;  Laterality: Right;   TRANSFORAMINAL LUMBAR INTERBODY FUSION (TLIF) WITH PEDICLE SCREW FIXATION 1 LEVEL N/A 12/05/2021   Procedure: Open Lumbar five-Sacral one Laminectomy/Transforaminal Lumbar Interbody Fusion/Posterolateral instrumented fusion;  Surgeon: Jadene Pierini, MD;  Location: [facility];  Service: Neurosurgery;  Laterality: N/A;  3C   UMBILICAL HERNIA REPAIR N/A 03/25/2014   Procedure: UMBILICAL HERNIA REPAIR;  Surgeon: Marlane Hatcher, MD;  Location: [facility];  Service: General;  Laterality: N/A;   uterine ablation      Family History  Problem Relation Age of Onset   Depression Paternal  Grandfather    Depression Paternal Grandmother    Diabetes Father    Hypertension Mother    Cancer Mother    Diabetes Mother    Stroke Mother    Alzheimer's disease Mother    Cancer Paternal Uncle    Heart disease Other    Cancer Other    Diabetes Other    Achalasia Other    Anesthesia problems Neg Hx    Hypotension Neg Hx    Malignant hyperthermia Neg Hx    Pseudochol deficiency Neg Hx    Colon cancer Neg Hx    Colon polyps Neg Hx     Social History   Socioeconomic History   Marital status: Married    Spouse name: Not on file   Number of children: 2    Years of education: college   Highest education level: Some college, no degree  Occupational History   Occupation: Scientist, research (medical): Audiological scientist: [employer]   Occupation: Lawyer- at [employer]  Tobacco Use   Smoking status: Never   Smokeless tobacco: Never  Vaping Use   Vaping status: Never Used  Substance and Sexual Activity   Alcohol use: No   Drug use: No   Sexual activity: Yes    Partners: Male    Birth control/protection: Post-menopausal    Comment: ablation  Other Topics Concern   Not on file  Social History Narrative   Regular exercise: walks Caffeine use:    Social Determinants of Health   Financial Resource Strain: Low Risk  (04/12/2023)   Overall Financial Resource Strain (CARDIA)    Difficulty of Paying Living Expenses: Not hard at all  Food Insecurity: No Food Insecurity (04/12/2023)   Hunger Vital Sign    Worried About Running Out of Food in the Last Year: Never true    Ran Out of Food in the Last Year: Never true  Transportation Needs: No Transportation Needs (04/12/2023)   PRAPARE - Administrator, Civil Service (Medical): No    Lack of Transportation (Non-Medical): No  Physical Activity: Insufficiently Active (04/12/2023)   Exercise Vital Sign    Days of Exercise per Week: 2 days    Minutes of Exercise per Session: 10 min  Stress: Stress Concern Present (04/12/2023)   [employer] of Occupational Health - Occupational Stress Questionnaire    Feeling of Stress : To some extent  Social Connections: Moderately Integrated (04/12/2023)   Social Connection and Isolation Panel [NHANES]    Frequency of Communication with Friends and Family: More than three times a week    Frequency of Social Gatherings with Friends and Family: Once a week    Attends Religious Services: 1 to 4 times per year    Active Member of [employer] or Organizations: No    Attends Banker Meetings: Never    Marital Status: Married  Catering manager  Violence: Not At Risk (01/31/2023)   Humiliation, Afraid, Rape, and Kick questionnaire    Fear of Current or Ex-Partner: No    Emotionally Abused: No    Physically Abused: No    Sexually Abused: No    Outpatient Medications Prior to Visit  Medication Sig Dispense Refill   albuterol (VENTOLIN HFA) 108 (90 Base) MCG/ACT inhaler Inhale 2 puffs into the lungs every 6 (six) hours as needed for wheezing or shortness of breath.      blood glucose meter kit and supplies Use up to four times daily  as directed. 1 each 0   cyclobenzaprine (FLEXERIL) 10 MG tablet Take 1 tablet (10 mg total) by mouth 3 (three) times daily as needed for muscle spasms. 30 tablet 0   Dulaglutide (TRULICITY) 4.5 MG/0.5ML SOPN Inject 4.5 mg as directed once a week. 6 mL 1   Empagliflozin-metFORMIN HCl (SYNJARDY) 12.5-500 MG TABS Take 1 tablet by mouth daily. 90 tablet 0   furosemide (LASIX) 20 MG tablet Take 20 mg by mouth 2 (two) times daily as needed for edema.     gabapentin (NEURONTIN) 600 MG tablet Take 1 tablet (600 mg total) by mouth 3 (three) times daily. 90 tablet 2   glucose blood test strip Test up to 4 times a day 100 each 0   Lancets (FREESTYLE) lancets Use to test 4 times daily 100 each 0   lisinopril-hydrochlorothiazide (ZESTORETIC) 20-12.5 MG tablet Take 1 tablet by mouth daily. 90 tablet 3   meclizine (ANTIVERT) 25 MG tablet Take 1 tablet (25 mg total) by mouth 2 (two) times daily as needed for dizziness. 30 tablet 0   meloxicam (MOBIC) 7.5 MG tablet Take 1 tablet (7.5 mg total) by mouth daily. 30 tablet 5   omeprazole (PRILOSEC) 40 MG capsule Take 1 capsule (40 mg total) by mouth daily. 90 capsule 1   oxyCODONE-acetaminophen (PERCOCET/ROXICET) 5-325 MG tablet Take 1 tablet by mouth every 8 (eight) hours as needed for severe pain. 30 tablet 0   Potassium Citrate (UROCIT-K 15) 15 MEQ (1620 MG) TBCR Take 1 tablet by mouth in the morning and at bedtime. 60 tablet 11   promethazine (PHENERGAN) 25 MG tablet TAKE 1  TABLET(25 MG) BY MOUTH EVERY 8 HOURS AS NEEDED FOR NAUSEA OR VOMITING 20 tablet 0   simvastatin (ZOCOR) 40 MG tablet Take 1 tablet (40 mg total) by mouth every evening for cholesterol 90 tablet 3   zolpidem (AMBIEN) 5 MG tablet Take 1 tablet (5 mg total) by mouth at bedtime as needed for sleep. 30 tablet 2   ondansetron (ZOFRAN) 4 MG tablet Take 1 tablet (4 mg total) by mouth daily as needed for nausea or vomiting. 30 tablet 1   No facility-administered medications prior to visit.    Allergies  Allergen Reactions   Ciprofloxacin Hives and Swelling   Penicillins Anaphylaxis and Rash   Codeine Other (See Comments)    had hallicunations   Morphine Nausea And Vomiting and Other (See Comments)    recieved in ED due to [facility] and had multple doses - gave visual hallucinations and vomitting.   Sulfonamide Derivatives Other (See Comments)    REACTION: Unsure - childhood allergy   Vancomycin Itching    ROS Review of Systems  Constitutional:  Positive for appetite change and fatigue. Negative for chills and fever.  HENT:  Negative for congestion, sinus pressure, sinus pain and sore throat.   Eyes:  Negative for pain and discharge.  Respiratory:  Negative for cough and shortness of breath.   Cardiovascular:  Negative for chest pain and palpitations.  Gastrointestinal:  Negative for abdominal pain, nausea and vomiting.  Endocrine: Negative for polydipsia and polyuria.  Genitourinary:  Negative for dysuria and hematuria.  Musculoskeletal:  Positive for back pain. Negative for neck pain and neck stiffness.  Skin:  Negative for rash.  Neurological:  Negative for dizziness and weakness.  Psychiatric/Behavioral:  Positive for dysphoric mood and sleep disturbance. Negative for agitation and behavioral problems.       Objective:    Physical Exam Vitals reviewed.  Constitutional:      General: She is not in acute distress.    Appearance: She is obese. She is not diaphoretic.  HENT:      Head: Normocephalic and atraumatic.     Mouth/Throat:     Mouth: Mucous membranes are moist.     Pharynx: No posterior oropharyngeal erythema.  Eyes:     General: No scleral icterus.    Extraocular Movements: Extraocular movements intact.  Cardiovascular:     Rate and Rhythm: Normal rate and regular rhythm.     Pulses: Normal pulses.     Heart sounds: Normal heart sounds. No murmur heard. Pulmonary:     Breath sounds: Normal breath sounds. No wheezing or rales.  Abdominal:     Palpations: Abdomen is soft.     Tenderness: There is no abdominal tenderness.  Musculoskeletal:     Cervical back: Neck supple. No tenderness.     Right lower leg: No edema.     Left lower leg: No edema.  Skin:    General: Skin is warm.     Findings: No rash.  Neurological:     General: No focal deficit present.     Mental Status: She is alert and oriented to person, place, and time.  Psychiatric:        Mood and Affect: Mood is depressed. Affect is tearful.        Behavior: Behavior normal.     BP 115/75 (BP Location: Right Arm, Patient Position: Sitting, Cuff Size: Normal)   Pulse 93   Ht 4\' 9"  (1.448 m)   Wt 170 lb 9.6 oz (77.4 kg)   LMP 07/16/2012   SpO2 95%   BMI 36.92 kg/m  Wt Readings from Last 3 Encounters:  10/17/23 170 lb 9.6 oz (77.4 kg)  06/17/23 173 lb (78.5 kg)  05/29/23 176 lb 9.6 oz (80.1 kg)    Lab Results  Component Value Date   TSH 1.980 05/28/2023   Lab Results  Component Value Date   WBC 7.8 07/02/2022   HGB 14.2 07/02/2022   HCT 43.9 07/02/2022   MCV 85.4 07/02/2022   PLT 302 07/02/2022   Lab Results  Component Value Date   NA 140 05/28/2023   K 4.3 05/28/2023   CO2 28 05/28/2023   GLUCOSE 119 (H) 05/28/2023   BUN 14 05/28/2023   CREATININE 0.73 05/28/2023   BILITOT 0.5 05/28/2023   ALKPHOS 118 05/28/2023   AST 26 05/28/2023   ALT 22 05/28/2023   PROT 6.9 05/28/2023   ALBUMIN 4.4 05/28/2023   CALCIUM 9.4 05/28/2023   ANIONGAP 8 09/25/2022   EGFR  96 05/28/2023   Lab Results  Component Value Date   CHOL 144 05/28/2023   Lab Results  Component Value Date   HDL 50 05/28/2023   Lab Results  Component Value Date   LDLCALC 57 05/28/2023   Lab Results  Component Value Date   TRIG 230 (H) 05/28/2023   Lab Results  Component Value Date   CHOLHDL 2.9 05/28/2023   Lab Results  Component Value Date   HGBA1C 6.5 05/29/2023      Assessment & Plan:   Problem List Items Addressed This Visit    Diabetes mellitus (HCC) Lab Results  Component Value Date   HGBA1C 6.5 05/29/2023   Well controlled Associated with diabetic neuropathy, HTN and HLD followed by Dr. Fransico Him now On Trulicity and Synjardy Considering her GI symptoms, can try a different GLP1 agonist, defer to Dr. Fransico Him  Advised to follow diabetic diet On statin and ACEi F/u CMP and lipid panel Diabetic eye exam: Advised to follow up with Ophthalmology for diabetic eye exam  Essential hypertension, benign BP Readings from Last 1 Encounters:  10/17/23 115/75   Well-controlled with Lisinopril-HCTZ 20-12.5 mg QD Counseled for compliance with the medications Advised DASH diet and moderate exercise/walking as tolerated  Mixed hyperlipidemia On Zocor Check lipid profile  GERD (gastroesophageal reflux disease) Her epigastric discomfort, excessive burping and mild nausea could be due to gastritis/GERD Had started omeprazole 40 mg every day, needs to stay compliant Avoid hot and spicy food Zofran PRN for nausea  Depression Flowsheet Row Office Visit from 10/17/2023 in [facility]  PHQ-9 Total Score 6      Started Celexa 10 mg once daily Has tried Effexor in the past (had adequate response as well), but prefer to start SSRI instead for now    Meds ordered this encounter  Medications   ondansetron (ZOFRAN) 4 MG tablet    Sig: Take 1 tablet (4 mg total) by mouth daily as needed for nausea or vomiting.    Dispense:  30 tablet    Refill:   1   citalopram (CELEXA) 10 MG tablet    Sig: Take 1 tablet (10 mg total) by mouth daily.    Dispense:  30 tablet    Refill:  3    Follow-up: Return in about 4 months (around 02/14/2024) for MDD and HTN.    Anabel Halon, MD

## 2023-10-17 NOTE — Assessment & Plan Note (Addendum)
Lab Results  Component Value Date   HGBA1C 6.5 05/29/2023   Well controlled Associated with diabetic neuropathy, HTN and HLD followed by Dr. Fransico Him now On Trulicity and [place] Considering her GI symptoms, can try a different GLP1 agonist, defer to Dr. Fransico Him Advised to follow diabetic diet On statin and ACEi F/u CMP and lipid panel Diabetic eye exam: Advised to follow up with Ophthalmology for diabetic eye exam

## 2023-10-17 NOTE — Assessment & Plan Note (Signed)
BP Readings from Last 1 Encounters:  10/17/23 115/75   Well-controlled with Lisinopril-HCTZ 20-12.5 mg QD Counseled for compliance with the medications Advised DASH diet and moderate exercise/walking as tolerated

## 2023-10-17 NOTE — Assessment & Plan Note (Signed)
On Zocor Check lipid profile

## 2023-10-17 NOTE — Progress Notes (Signed)
Name: [name] DOB: Nov 04, 1966 MRN: [id number]  History of Present Illness: [name] is a 57 y.o. female who presents today for follow up visit at [facility]. - GU History: 1. Kidney stones. Has had prior ESWL and URS procedures. 2. Chronic left flank pain.   At last visit with Dr. Ronne Binning on 05/01/2023: - No recent stone events. No stones on 03/20/2023 RUS. - Advised follow up in 6 months with renal US. - Referred to pain management for chronic left flank pain.  Since last visit: - 10/03/2023: RUS negative for stones, masses, or hydronephrosis.   Today: She denies recent stone passage. She reports persistent chronic left flank pain; states she never received a call from the pain management referral coordinator.   Reports that sometimes she needs to urinate but can't get started and has pain associated with that.   Also reports occasional urge incontinence - primarily when she waits too long to go to the bathroom.  She denies increased urinary urgency, frequency, nocturia, dysuria, or gross hematuria. She denies caffeine intake.   Fall Screening: Do you usually have a device to assist in your mobility? No   Medications: Current Outpatient Medications  Medication Sig Dispense Refill   albuterol (VENTOLIN HFA) 108 (90 Base) MCG/ACT inhaler Inhale 2 puffs into the lungs every 6 (six) hours as needed for wheezing or shortness of breath.      blood glucose meter kit and supplies Use up to four times daily as directed. 1 each 0   citalopram (CELEXA) 10 MG tablet Take 1 tablet (10 mg total) by mouth daily. 30 tablet 3   cyclobenzaprine (FLEXERIL) 10 MG tablet Take 1 tablet (10 mg total) by mouth 3 (three) times daily as needed for muscle spasms. 30 tablet 0   Dulaglutide (TRULICITY) 4.5 MG/0.5ML SOPN Inject 4.5 mg as directed once a week. 6 mL 1   Empagliflozin-metFORMIN HCl (SYNJARDY) 12.5-500 MG TABS Take 1 tablet by mouth daily. 90 tablet 0   furosemide  (LASIX) 20 MG tablet Take 20 mg by mouth 2 (two) times daily as needed for edema.     gabapentin (NEURONTIN) 600 MG tablet Take 1 tablet (600 mg total) by mouth 3 (three) times daily. 90 tablet 2   glucose blood test strip Test up to 4 times a day 100 each 0   Lancets (FREESTYLE) lancets Use to test 4 times daily 100 each 0   lisinopril-hydrochlorothiazide (ZESTORETIC) 20-12.5 MG tablet Take 1 tablet by mouth daily. 90 tablet 3   meclizine (ANTIVERT) 25 MG tablet Take 1 tablet (25 mg total) by mouth 2 (two) times daily as needed for dizziness. 30 tablet 0   meloxicam (MOBIC) 7.5 MG tablet Take 1 tablet (7.5 mg total) by mouth daily. 30 tablet 5   omeprazole (PRILOSEC) 40 MG capsule Take 1 capsule (40 mg total) by mouth daily. 90 capsule 1   ondansetron (ZOFRAN) 4 MG tablet Take 1 tablet (4 mg total) by mouth daily as needed for nausea or vomiting. 30 tablet 1   oxyCODONE-acetaminophen (PERCOCET/ROXICET) 5-325 MG tablet Take 1 tablet by mouth every 8 (eight) hours as needed for severe pain. 30 tablet 0   Potassium Citrate (UROCIT-K 15) 15 MEQ (1620 MG) TBCR Take 1 tablet by mouth in the morning and at bedtime. 60 tablet 11   promethazine (PHENERGAN) 25 MG tablet TAKE 1 TABLET(25 MG) BY MOUTH EVERY 8 HOURS AS NEEDED FOR NAUSEA OR VOMITING 20 tablet 0   simvastatin (  ZOCOR) 40 MG tablet Take 1 tablet (40 mg total) by mouth every evening for cholesterol 90 tablet 3   zolpidem (AMBIEN) 5 MG tablet Take 1 tablet (5 mg total) by mouth at bedtime as needed for sleep. 30 tablet 2   No current facility-administered medications for this visit.    Allergies: Allergies  Allergen Reactions   Ciprofloxacin Hives and Swelling   Penicillins Anaphylaxis and Rash   Codeine Other (See Comments)    had hallicunations   Morphine Nausea And Vomiting and Other (See Comments)    recieved in ED due to [facility] and had multple doses - gave visual hallucinations and vomitting.   Sulfonamide Derivatives Other (See  Comments)    REACTION: Unsure - childhood allergy   Vancomycin Itching    Past Medical History:  Diagnosis Date   Diabetes mellitus    insulin pump 2013   Dysphagia 03/09/2020   Epidural hematoma (HCC) 12/14/2021   GERD (gastroesophageal reflux disease)    History of kidney stones    Hypercholesteremia    Hypertension    Incarcerated umbilical hernia 03/25/2014   Kidney stone    Kidney stones 08/31/2008   Qualifier: Diagnosis of  By: Erby Pian MD, Cornelius     Neuropathy    Obesity    Palpitations    Shortness of breath    Vertigo    Past Surgical History:  Procedure Laterality Date   BIOPSY  06/23/2020   Procedure: BIOPSY;  Surgeon: Corbin Ade, MD;  Location: [facility];  Service: Endoscopy;;   CARDIAC CATHETERIZATION  12/11/2012   normal coronary arteries   CESAREAN SECTION  1994;2000   [place]   CHOLECYSTECTOMY  1992   COLONOSCOPY WITH PROPOFOL N/A 06/23/2020   large amount of stool in entire colon, prep inadequate.    CYSTOSCOPY W/ URETERAL STENT PLACEMENT  05/08/2012   Procedure: CYSTOSCOPY WITH RETROGRADE PYELOGRAM/URETERAL STENT PLACEMENT;  Surgeon: Ky Barban, MD;  Location: [facility];  Service: Urology;  Laterality: Right;   CYSTOSCOPY WITH RETROGRADE PYELOGRAM, URETEROSCOPY AND STENT PLACEMENT Right 05/04/2020   Procedure: CYSTOSCOPY WITH RIGHT RETROGRADE PYELOGRAM, RIGHT URETEROSCOPY AND RIGHT URETERAL STENT EXCHANGE;  Surgeon: Malen Gauze, MD;  Location: [facility];  Service: Urology;  Laterality: Right;   CYSTOSCOPY WITH RETROGRADE PYELOGRAM, URETEROSCOPY AND STENT PLACEMENT Left 08/08/2020   Procedure: CYSTOSCOPY WITH RETROGRADE PYELOGRAM, URETEROSCOPY WITH LASER  AND STENT PLACEMENT;  Surgeon: Malen Gauze, MD;  Location: [facility];  Service: Urology;  Laterality: Left;   CYSTOSCOPY WITH RETROGRADE PYELOGRAM, URETEROSCOPY AND STENT PLACEMENT Right 11/08/2020   Procedure: CYSTOSCOPY WITH RIGHT RETROGRADE PYELOGRAM, RIGHT URETEROSCOPY AND STENT  PLACEMENT;  Surgeon: Malen Gauze, MD;  Location: [facility];  Service: Urology;  Laterality: Right;   CYSTOSCOPY WITH RETROGRADE PYELOGRAM, URETEROSCOPY AND STENT PLACEMENT Left 12/27/2020   Procedure: CYSTOSCOPY WITH LEFT RETROGRADE PYELOGRAM, LEFT URETEROSCOPY AND LEFT URETERAL STENT PLACEMENT;  Surgeon: Malen Gauze, MD;  Location: [facility];  Service: Urology;  Laterality: Left;   CYSTOSCOPY WITH RETROGRADE PYELOGRAM, URETEROSCOPY AND STENT PLACEMENT Bilateral 03/16/2021   Procedure: CYSTOSCOPY WITH BILATERAL  RETROGRADE PYELOGRAM, BILATERAL URETEROSCOPY AND STENT PLACEMENT ON THE LEFT;  Surgeon: Malen Gauze, MD;  Location: [facility];  Service: Urology;  Laterality: Bilateral;   CYSTOSCOPY WITH RETROGRADE PYELOGRAM, URETEROSCOPY AND STENT PLACEMENT Left 06/28/2022   Procedure: CYSTOSCOPY WITH RETROGRADE PYELOGRAM, URETEROSCOPY AND STENT PLACEMENT;  Surgeon: Malen Gauze, MD;  Location: [facility];  Service: Urology;  Laterality: Left;   CYSTOSCOPY  WITH RETROGRADE PYELOGRAM, URETEROSCOPY AND STENT PLACEMENT Bilateral 09/27/2022   Procedure: CYSTOSCOPY WITH BILATERAL RETROGRADE PYELOGRAM, URETEROSCOPY AND STENT PLACEMENT;  Surgeon: Malen Gauze, MD;  Location: [facility];  Service: Urology;  Laterality: Bilateral;  pt knows to arrive at 6:00   CYSTOSCOPY WITH STENT PLACEMENT Right 02/25/2020   Procedure: CYSTOSCOPY WITH RIGHT RETROGRADE RIGHT STENT PLACEMENT;  Surgeon: Jerilee Field, MD;  Location: [facility];  Service: Urology;  Laterality: Right;   ESOPHAGOGASTRODUODENOSCOPY (EGD) WITH PROPOFOL N/A 06/23/2020   Normal esophagus, multiple localized erosions were found in gastric antrum s/p biopsy, normal duodenum. Empiric dilation.Reactive gastropathy with erosions, negative H.pylori, no dysplasia.     EXTRACORPOREAL SHOCK WAVE LITHOTRIPSY Right 10/11/2020   Procedure: EXTRACORPOREAL SHOCK WAVE LITHOTRIPSY (ESWL);  Surgeon: Malen Gauze, MD;  Location: [facility];  Service: Urology;   Laterality: Right;   FOOT SURGERY Right March, 2013   [facility]-removal of bone spur   HOLMIUM LASER APPLICATION  05/04/2020   Procedure: HOLMIUM LASER LITHOTRIPSY RIGHT URETERAL CALCULUS;  Surgeon: Malen Gauze, MD;  Location: [facility];  Service: Urology;;   HYSTEROSCOPY WITH THERMACHOICE  04/25/2012   Procedure: HYSTEROSCOPY WITH THERMACHOICE;  Surgeon: Lazaro Arms, MD;  Location: [facility];  Service: Gynecology;  Laterality: N/A;  total therapy time= 11 minutes 42 seconds; 34 ml D5w  in and 34 ml D5w out; temp =87 degrees F   LEFT HEART CATHETERIZATION WITH CORONARY ANGIOGRAM N/A 12/11/2012   Procedure: LEFT HEART CATHETERIZATION WITH CORONARY ANGIOGRAM;  Surgeon: Runell Gess, MD;  [facility] C[facility] LAB;  Service: Cardiovascular;  Laterality: N/A;   LUMBAR WOUND DEBRIDEMENT N/A 12/14/2021   Procedure: LUMBAR HEMATOMA EVACUATION;  Surgeon: Jadene Pierini, MD;  Location: [facility];  Service: Neurosurgery;  Laterality: N/A;   MALONEY DILATION N/A 06/23/2020   Procedure: [name] DILATION;  Surgeon: Corbin Ade, MD;  Location: [facility];  Service: Endoscopy;  Laterality: N/A;   Sinus sergery  1988   [place]   STONE EXTRACTION WITH BASKET Right 11/08/2020   Procedure: STONE EXTRACTION WITH BASKET;  Surgeon: Malen Gauze, MD;  Location: [facility];  Service: Urology;  Laterality: Right;   TRANSFORAMINAL LUMBAR INTERBODY FUSION (TLIF) WITH PEDICLE SCREW FIXATION 1 LEVEL N/A 12/05/2021   Procedure: Open Lumbar five-Sacral one Laminectomy/Transforaminal Lumbar Interbody Fusion/Posterolateral instrumented fusion;  Surgeon: Jadene Pierini, MD;  Location: [facility];  Service: Neurosurgery;  Laterality: N/A;  3C   UMBILICAL HERNIA REPAIR N/A 03/25/2014   Procedure: UMBILICAL HERNIA REPAIR;  Surgeon: Marlane Hatcher, MD;  Location: [facility];  Service: General;  Laterality: N/A;   uterine ablation     Family History  Problem Relation Age of Onset   Depression Paternal Grandfather     Depression Paternal Grandmother    Diabetes Father    Hypertension Mother    Cancer Mother    Diabetes Mother    Stroke Mother    Alzheimer's disease Mother    Cancer Paternal Uncle    Heart disease Other    Cancer Other    Diabetes Other    Achalasia Other    Anesthesia problems Neg Hx    Hypotension Neg Hx    Malignant hyperthermia Neg Hx    Pseudochol deficiency Neg Hx    Colon cancer Neg Hx    Colon polyps Neg Hx    Social History   Socioeconomic History   Marital status: Married    Spouse name: Not on file   Number of  children: 2   Years of education: college   Highest education level: Some college, no degree  Occupational History   Occupation: Scientist, research (medical): Audiological scientist: [employer]   Occupation: Lawyer- at [employer]  Tobacco Use   Smoking status: Never   Smokeless tobacco: Never  Vaping Use   Vaping status: Never Used  Substance and Sexual Activity   Alcohol use: No   Drug use: No   Sexual activity: Yes    Partners: Male    Birth control/protection: Post-menopausal    Comment: ablation  Other Topics Concern   Not on file  Social History Narrative   Regular exercise: walks Caffeine use:    Social Determinants of Health   Financial Resource Strain: Low Risk  (04/12/2023)   Overall Financial Resource Strain (CARDIA)    Difficulty of Paying Living Expenses: Not hard at all  Food Insecurity: No Food Insecurity (04/12/2023)   Hunger Vital Sign    Worried About Running Out of Food in the Last Year: Never true    Ran Out of Food in the Last Year: Never true  Transportation Needs: No Transportation Needs (04/12/2023)   PRAPARE - Administrator, Civil Service (Medical): No    Lack of Transportation (Non-Medical): No  Physical Activity: Insufficiently Active (04/12/2023)   Exercise Vital Sign    Days of Exercise per Week: 2 days    Minutes of Exercise per Session: 10 min  Stress: Stress Concern Present (04/12/2023)   [employer] of Occupational Health - Occupational Stress Questionnaire    Feeling of Stress : To some extent  Social Connections: Moderately Integrated (04/12/2023)   Social Connection and Isolation Panel [NHANES]    Frequency of Communication with Friends and Family: More than three times a week    Frequency of Social Gatherings with Friends and Family: Once a week    Attends Religious Services: 1 to 4 times per year    Active Member of [employer] or Organizations: No    Attends Banker Meetings: Never    Marital Status: Married  Catering manager Violence: Not At Risk (01/31/2023)   Humiliation, Afraid, Rape, and Kick questionnaire    Fear of Current or Ex-Partner: No    Emotionally Abused: No    Physically Abused: No    Sexually Abused: No    SUBJECTIVE  Review of Systems Constitutional: Patient denies any unintentional weight loss or change in strength lntegumentary: Patient denies any rashes or pruritus Cardiovascular: Patient denies chest pain or syncope Respiratory: Patient denies shortness of breath Gastrointestinal: Patient denies nausea, vomiting, constipation, or diarrhea Musculoskeletal: As per HPI Neurologic: Patient denies convulsions or seizures Allergic/Immunologic: Patient denies recent allergic reaction(s) Hematologic/Lymphatic: Patient denies bleeding tendencies Endocrine: Patient denies heat/cold intolerance  GU: As per HPI.  OBJECTIVE Vitals:   10/18/23 1242  BP: 115/75  Pulse: 82  Temp: 98 F (36.7 C)   There is no height or weight on file to calculate BMI.  Physical Examination Constitutional: No obvious distress; patient is non-toxic appearing  Cardiovascular: No visible lower extremity edema.  Respiratory: The patient does not have audible wheezing/stridor; respirations do not appear labored  Gastrointestinal: Abdomen non-distended Musculoskeletal: Normal ROM of UEs  Skin: No obvious rashes/open sores  Neurologic: CN 2-12 grossly  intact Psychiatric: Answered questions appropriately with normal affect  Hematologic/Lymphatic/Immunologic: No obvious bruises or sites of spontaneous bleeding  UA: 6-10 WBC/hpf, no RBC or bacteria   ASSESSMENT  Recurrent nephrolithiasis - Plan: US RENAL  Chronic left flank pain - Plan: Ambulatory referral to Pain Clinic  Pelvic pain in female - Plan: Ambulatory referral to Physical Therapy  Voiding dysfunction - Plan: Ambulatory referral to Physical Therapy  Urge incontinence  We reviewed recent imaging results; no GU findings to account for her left flank pain. Referred again to pain management.   For intermittent voiding dysfunction we discussed suspected pelvic muscle floor dysfunction and how it can contribute to pelvic pain / voiding dysfunction. We discussed management options including relaxation techniques, stretching, pelvic floor physical therapy. Patient elected to try pelvic floor physical therapy; referral placed.  For occasional urge incontinence associated with delayed voiding, advised timed voiding.   Will plan to follow up in 6 months with RUS for stone surveillance or sooner if needed. Pt verbalized understanding and agreement. All questions were answered.  PLAN Advised the following: Timed voiding. Referral placed to Pain Management. Referral placed to pelvic floor physical therapy. Return in about 6 months (around 04/16/2024) for RUS, UA, & f/u with Evette Georges NP.  Orders Placed This Encounter  Procedures   US RENAL    Standing Status:   Future    Standing Expiration Date:   10/17/2024    Order Specific Question:   Reason for Exam (SYMPTOM  OR DIAGNOSIS REQUIRED)    Answer:   kidney stone known or suspected    Order Specific Question:   Preferred imaging location?    Answer:   [facility]   Ambulatory referral to Pain Clinic    Referral Priority:   Routine    Referral Type:   Consultation    Referral Reason:   Specialty Services Required     Requested Specialty:   Pain Medicine    Number of Visits Requested:   1   Ambulatory referral to Physical Therapy    Referral Priority:   Routine    Referral Type:   Physical Medicine    Referral Reason:   Specialty Services Required    Requested Specialty:   Physical Therapy    Number of Visits Requested:   1    It has been explained that the patient is to follow regularly with their PCP in addition to all other providers involved in their care and to follow instructions provided by these respective offices. Patient advised to contact urology clinic if any urologic-pertaining questions, concerns, new symptoms or problems arise in the interim period.  There are no Patient Instructions on file for this visit.  Electronically signed by:  Donnita Falls, MSN, FNP-C, CUNP 10/18/2023 1:18 PM

## 2023-10-17 NOTE — Patient Instructions (Signed)
Please start taking Celexa as prescribed.  Please continue to take medications as prescribed.  Please continue to follow low carb diet and perform moderate exercise/walking at least 150 mins/week.

## 2023-10-18 ENCOUNTER — Ambulatory Visit (INDEPENDENT_AMBULATORY_CARE_PROVIDER_SITE_OTHER): Payer: 59 | Admitting: Urology

## 2023-10-18 ENCOUNTER — Encounter: Payer: Self-pay | Admitting: Urology

## 2023-10-18 ENCOUNTER — Other Ambulatory Visit (HOSPITAL_COMMUNITY): Payer: 59

## 2023-10-18 VITALS — BP 115/75 | HR 82 | Temp 98.0°F

## 2023-10-18 DIAGNOSIS — N200 Calculus of kidney: Secondary | ICD-10-CM

## 2023-10-18 DIAGNOSIS — R109 Unspecified abdominal pain: Secondary | ICD-10-CM

## 2023-10-18 DIAGNOSIS — N398 Other specified disorders of urinary system: Secondary | ICD-10-CM

## 2023-10-18 DIAGNOSIS — G8929 Other chronic pain: Secondary | ICD-10-CM | POA: Insufficient documentation

## 2023-10-18 DIAGNOSIS — Z87442 Personal history of urinary calculi: Secondary | ICD-10-CM | POA: Diagnosis not present

## 2023-10-18 DIAGNOSIS — N3941 Urge incontinence: Secondary | ICD-10-CM | POA: Diagnosis not present

## 2023-10-18 DIAGNOSIS — R102 Pelvic and perineal pain: Secondary | ICD-10-CM

## 2023-10-18 NOTE — Assessment & Plan Note (Signed)
Flowsheet Row Office Visit from 10/17/2023 in [facility]  PHQ-9 Total Score 6      Started Celexa 10 mg once daily Has tried Effexor in the past (had adequate response as well), but prefer to start SSRI instead for now

## 2023-10-18 NOTE — Assessment & Plan Note (Signed)
Her epigastric discomfort, excessive burping and mild nausea could be due to gastritis/GERD Had started omeprazole 40 mg every day, needs to stay compliant Avoid hot and spicy food Zofran PRN for nausea

## 2023-10-21 ENCOUNTER — Encounter: Payer: Self-pay | Admitting: *Deleted

## 2023-10-22 ENCOUNTER — Telehealth: Payer: 59 | Admitting: Physician Assistant

## 2023-10-22 ENCOUNTER — Encounter: Payer: Self-pay | Admitting: Physician Assistant

## 2023-10-22 ENCOUNTER — Ambulatory Visit: Payer: Self-pay | Admitting: Internal Medicine

## 2023-10-22 DIAGNOSIS — M109 Gout, unspecified: Secondary | ICD-10-CM | POA: Diagnosis not present

## 2023-10-22 DIAGNOSIS — M25541 Pain in joints of right hand: Secondary | ICD-10-CM | POA: Diagnosis not present

## 2023-10-22 MED ORDER — METHYLPREDNISOLONE 4 MG PO TBPK: ORAL_TABLET | ORAL | 0 refills | Status: DC

## 2023-10-22 NOTE — Progress Notes (Signed)
Virtual Visit Consent   [name], you are scheduled for a virtual visit with a [facility] provider today. Just as with appointments in the office, your consent must be obtained to participate. Your consent will be active for this visit and any virtual visit you may have with one of our providers in the next 365 days. If you have a MyChart account, a copy of this consent can be sent to you electronically.  As this is a virtual visit, video technology does not allow for your provider to perform a traditional examination. This may limit your provider's ability to fully assess your condition. If your provider identifies any concerns that need to be evaluated in person or the need to arrange testing (such as labs, EKG, etc.), we will make arrangements to do so. Although advances in technology are sophisticated, we cannot ensure that it will always work on either your end or our end. If the connection with a video visit is poor, the visit may have to be switched to a telephone visit. With either a video or telephone visit, we are not always able to ensure that we have a secure connection.  By engaging in this virtual visit, you consent to the provision of healthcare and authorize for your insurance to be billed (if applicable) for the services provided during this visit. Depending on your insurance coverage, you may receive a charge related to this service.  I need to obtain your verbal consent now. Are you willing to proceed with your visit today? [name] has provided verbal consent on 10/22/2023 for a virtual visit (video or telephone). Laure Kidney, [place]  Date: 10/22/2023 12:59 PM  Virtual Visit via Video Note   I, Laure Kidney, connected with  [name]  ([id number], November 06, 1966) on 10/22/23 at 12:45 PM EST by a video-enabled telemedicine application and verified that I am speaking with the correct person using two identifiers.  Location: Patient: Virtual Visit Location Patient:  Home Provider: Virtual Visit Location Provider: Home Office   I discussed the limitations of evaluation and management by telemedicine and the availability of in person appointments. The patient expressed understanding and agreed to proceed.    History of Present Illness: [name] is a 57 y.o. who identifies as a female who was assigned female at birth, and is being seen today for suspected gout flare.Marland Kitchen  HPI: Hand Pain  The incident occurred 12 to 24 hours ago. There was no injury mechanism. Pain location: right pinky. The quality of the pain is described as aching. The pain is moderate. The pain has been Constant since the incident. Pertinent negatives include no chest pain, muscle weakness, numbness or tingling. The symptoms are aggravated by palpation. She has tried nothing for the symptoms. The treatment provided no relief.    Problems:  Patient Active Problem List   Diagnosis Date Noted   Chronic left flank pain 10/18/2023   B12 deficiency 06/17/2023   Long-term (current) use of injectable non-insulin antidiabetic drugs 05/30/2023   Epidermoid cyst of skin of scalp 04/15/2023   Cervical spinal stenosis 01/30/2023   Nodule of finger of right hand 01/22/2023   Acute idiopathic gout 01/01/2023   Fecal incontinence 01/01/2023   Vertigo 08/09/2022   Chronic mucoid otitis media of left ear 05/16/2022   Primary insomnia 05/09/2022   ASCUS of cervix with negative high risk HPV 01/16/2022   Spondylolisthesis of lumbar region 12/05/2021   Spinal stenosis of lumbar region with neurogenic claudication  10/25/2021   Chronic bilateral low back pain with left-sided sciatica 10/18/2021   GERD (gastroesophageal reflux disease) 03/09/2020   Other intervertebral disc degeneration, lumbar region 05/24/2019   OSA (obstructive sleep apnea) 12/08/2014   Depression 07/15/2013   Dyspnea 11/12/2012   Headache 10/14/2012   Neuropathy 10/14/2012   Essential hypertension, benign 05/10/2012    Diabetes mellitus (HCC) 09/29/2008   Mixed hyperlipidemia 09/29/2008   Class 2 severe obesity due to excess calories with serious comorbidity and body mass index (BMI) of 38.0 to 38.9 in adult [facility]) 08/31/2008   Recurrent nephrolithiasis 08/31/2008    Allergies:  Allergies  Allergen Reactions   Ciprofloxacin Hives and Swelling   Penicillins Anaphylaxis and Rash   Codeine Other (See Comments)    had hallicunations   Morphine Nausea And Vomiting and Other (See Comments)    recieved in ED due to [facility] and had multple doses - gave visual hallucinations and vomitting.   Sulfonamide Derivatives Other (See Comments)    REACTION: Unsure - childhood allergy   Vancomycin Itching   Medications:  Current Outpatient Medications:    methylPREDNISolone (MEDROL DOSEPAK) 4 MG TBPK tablet, Take as directed on packaging., Disp: 1 each, Rfl: 0   albuterol (VENTOLIN HFA) 108 (90 Base) MCG/ACT inhaler, Inhale 2 puffs into the lungs every 6 (six) hours as needed for wheezing or shortness of breath. , Disp: , Rfl:    blood glucose meter kit and supplies, Use up to four times daily as directed., Disp: 1 each, Rfl: 0   citalopram (CELEXA) 10 MG tablet, Take 1 tablet (10 mg total) by mouth daily., Disp: 30 tablet, Rfl: 3   cyclobenzaprine (FLEXERIL) 10 MG tablet, Take 1 tablet (10 mg total) by mouth 3 (three) times daily as needed for muscle spasms., Disp: 30 tablet, Rfl: 0   Dulaglutide (TRULICITY) 4.5 MG/0.5ML SOPN, Inject 4.5 mg as directed once a week., Disp: 6 mL, Rfl: 1   Empagliflozin-metFORMIN HCl (SYNJARDY) 12.5-500 MG TABS, Take 1 tablet by mouth daily., Disp: 90 tablet, Rfl: 0   furosemide (LASIX) 20 MG tablet, Take 20 mg by mouth 2 (two) times daily as needed for edema., Disp: , Rfl:    gabapentin (NEURONTIN) 600 MG tablet, Take 1 tablet (600 mg total) by mouth 3 (three) times daily., Disp: 90 tablet, Rfl: 2   glucose blood test strip, Test up to 4 times a day, Disp: 100 each, Rfl: 0   Lancets  (FREESTYLE) lancets, Use to test 4 times daily, Disp: 100 each, Rfl: 0   lisinopril-hydrochlorothiazide (ZESTORETIC) 20-12.5 MG tablet, Take 1 tablet by mouth daily., Disp: 90 tablet, Rfl: 3   meclizine (ANTIVERT) 25 MG tablet, Take 1 tablet (25 mg total) by mouth 2 (two) times daily as needed for dizziness., Disp: 30 tablet, Rfl: 0   meloxicam (MOBIC) 7.5 MG tablet, Take 1 tablet (7.5 mg total) by mouth daily., Disp: 30 tablet, Rfl: 5   omeprazole (PRILOSEC) 40 MG capsule, Take 1 capsule (40 mg total) by mouth daily., Disp: 90 capsule, Rfl: 1   ondansetron (ZOFRAN) 4 MG tablet, Take 1 tablet (4 mg total) by mouth daily as needed for nausea or vomiting., Disp: 30 tablet, Rfl: 1   oxyCODONE-acetaminophen (PERCOCET/ROXICET) 5-325 MG tablet, Take 1 tablet by mouth every 8 (eight) hours as needed for severe pain., Disp: 30 tablet, Rfl: 0   Potassium Citrate (UROCIT-K 15) 15 MEQ (1620 MG) TBCR, Take 1 tablet by mouth in the morning and at bedtime., Disp: 60 tablet, Rfl:  11   promethazine (PHENERGAN) 25 MG tablet, TAKE 1 TABLET(25 MG) BY MOUTH EVERY 8 HOURS AS NEEDED FOR NAUSEA OR VOMITING, Disp: 20 tablet, Rfl: 0   simvastatin (ZOCOR) 40 MG tablet, Take 1 tablet (40 mg total) by mouth every evening for cholesterol, Disp: 90 tablet, Rfl: 3   zolpidem (AMBIEN) 5 MG tablet, Take 1 tablet (5 mg total) by mouth at bedtime as needed for sleep., Disp: 30 tablet, Rfl: 2  Observations/Objective: Patient is well-developed, well-nourished in no acute distress.  Resting comfortably  at home.  Head is normocephalic, atraumatic.  No labored breathing.  Speech is clear and coherent with logical content.  Patient is alert and oriented at baseline.  Swollen right 5th DIP. No obvious deformity or open lesions or tracking noted.   Assessment and Plan: 1. Exacerbation of gout  Patient presenting with what appears to be an exacerbation of her gout.  Area is localized to the distal interphalangeal joint of the right  fifth digit.  She has no difficulty with moving the digit no positive Knievel signs.  Will treat with a Medrol Dosepak in the interim I have advised her to monitor her sugars as she is a diabetic.  I have also advised her to schedule a follow-up with her primary care provider in the next 2 to 3 days for reevaluation.  She verbalized understanding and all questions were answered during her telehealth visit on today  Follow Up Instructions: I discussed the assessment and treatment plan with the patient. The patient was provided an opportunity to ask questions and all were answered. The patient agreed with the plan and demonstrated an understanding of the instructions.  A copy of instructions were sent to the patient via MyChart unless otherwise noted below.   .  The patient was advised to call back or seek an in-person evaluation if the symptoms worsen or if the condition fails to improve as anticipated.    Laure Kidney, PA-C

## 2023-10-22 NOTE — Telephone Encounter (Signed)
Spoke to patient

## 2023-10-22 NOTE — Patient Instructions (Signed)
[name], thank you for joining Laure Kidney, PA-C for today's virtual visit.  While this provider is not your primary care provider (PCP), if your PCP is located in our provider database this encounter information will be shared with them immediately following your visit.   A [facility] MyChart account gives you access to today's visit and all your visits, tests, and labs performed at [facility] " click here if you don't have a [facility] MyChart account or go to [email or web address]  Consent: (Patient) [name] provided verbal consent for this virtual visit at the beginning of the encounter.  Current Medications:  Current Outpatient Medications:    methylPREDNISolone (MEDROL DOSEPAK) 4 MG TBPK tablet, Take as directed on packaging., Disp: 1 each, Rfl: 0   albuterol (VENTOLIN HFA) 108 (90 Base) MCG/ACT inhaler, Inhale 2 puffs into the lungs every 6 (six) hours as needed for wheezing or shortness of breath. , Disp: , Rfl:    blood glucose meter kit and supplies, Use up to four times daily as directed., Disp: 1 each, Rfl: 0   citalopram (CELEXA) 10 MG tablet, Take 1 tablet (10 mg total) by mouth daily., Disp: 30 tablet, Rfl: 3   cyclobenzaprine (FLEXERIL) 10 MG tablet, Take 1 tablet (10 mg total) by mouth 3 (three) times daily as needed for muscle spasms., Disp: 30 tablet, Rfl: 0   Dulaglutide (TRULICITY) 4.5 MG/0.5ML SOPN, Inject 4.5 mg as directed once a week., Disp: 6 mL, Rfl: 1   Empagliflozin-metFORMIN HCl (SYNJARDY) 12.5-500 MG TABS, Take 1 tablet by mouth daily., Disp: 90 tablet, Rfl: 0   furosemide (LASIX) 20 MG tablet, Take 20 mg by mouth 2 (two) times daily as needed for edema., Disp: , Rfl:    gabapentin (NEURONTIN) 600 MG tablet, Take 1 tablet (600 mg total) by mouth 3 (three) times daily., Disp: 90 tablet, Rfl: 2   glucose blood test strip, Test up to 4 times a day, Disp: 100 each, Rfl: 0   Lancets (FREESTYLE) lancets, Use to test 4 times daily, Disp:  100 each, Rfl: 0   lisinopril-hydrochlorothiazide (ZESTORETIC) 20-12.5 MG tablet, Take 1 tablet by mouth daily., Disp: 90 tablet, Rfl: 3   meclizine (ANTIVERT) 25 MG tablet, Take 1 tablet (25 mg total) by mouth 2 (two) times daily as needed for dizziness., Disp: 30 tablet, Rfl: 0   meloxicam (MOBIC) 7.5 MG tablet, Take 1 tablet (7.5 mg total) by mouth daily., Disp: 30 tablet, Rfl: 5   omeprazole (PRILOSEC) 40 MG capsule, Take 1 capsule (40 mg total) by mouth daily., Disp: 90 capsule, Rfl: 1   ondansetron (ZOFRAN) 4 MG tablet, Take 1 tablet (4 mg total) by mouth daily as needed for nausea or vomiting., Disp: 30 tablet, Rfl: 1   oxyCODONE-acetaminophen (PERCOCET/ROXICET) 5-325 MG tablet, Take 1 tablet by mouth every 8 (eight) hours as needed for severe pain., Disp: 30 tablet, Rfl: 0   Potassium Citrate (UROCIT-K 15) 15 MEQ (1620 MG) TBCR, Take 1 tablet by mouth in the morning and at bedtime., Disp: 60 tablet, Rfl: 11   promethazine (PHENERGAN) 25 MG tablet, TAKE 1 TABLET(25 MG) BY MOUTH EVERY 8 HOURS AS NEEDED FOR NAUSEA OR VOMITING, Disp: 20 tablet, Rfl: 0   simvastatin (ZOCOR) 40 MG tablet, Take 1 tablet (40 mg total) by mouth every evening for cholesterol, Disp: 90 tablet, Rfl: 3   zolpidem (AMBIEN) 5 MG tablet, Take 1 tablet (5 mg total) by mouth at bedtime as needed for sleep., Disp: 30  tablet, Rfl: 2   Medications ordered in this encounter:  Meds ordered this encounter  Medications   methylPREDNISolone (MEDROL DOSEPAK) 4 MG TBPK tablet    Sig: Take as directed on packaging.    Dispense:  1 each    Refill:  0    Order Specific Question:   Supervising Provider    Answer:   Merrilee Jansky [id number]     *If you need refills on other medications prior to your next appointment, please contact your pharmacy*  Follow-Up: Call back or seek an in-person evaluation if the symptoms worsen or if the condition fails to improve as anticipated.  [facility] [phone]  Other  Instructions Please follow-up with your primary care provider in the next 2 to 3 days.   If you have been instructed to have an in-person evaluation today at a local Urgent Care facility, please use the link below. It will take you to a list of all of our available [facility], including address, phone number and hours of operation. Please do not delay care.  [facility]  If you or a family member do not have a primary care provider, use the link below to schedule a visit and establish care. When you choose a [facility] primary care physician or advanced practice provider, you gain a long-term partner in health. Find a Primary Care Provider  Learn more about [facility] in-office and virtual care options:  - Get Care Now

## 2023-10-22 NOTE — Telephone Encounter (Signed)
Copied from CRM [id number]. Topic: Clinical - Medical Advice >> Oct 22, 2023  8:46 AM Theodis Sato wrote: Reason for CRM: Swollen Finger/pain    Chief Complaint: Right finger swelling Symptoms: pinky finger swollen and "looks infected" Frequency: started last week Pertinent Negatives: Patient denies fever or injury Disposition: [] ED /[] Urgent Care (no appt availability in office) / [] Appointment(In office/virtual)/ []  [facility]/ [] Home Care/ [] Refused Recommended Disposition /[] [facility]/ []  Follow-up with PCP    Reason for Disposition  [1] MODERATE pain (e.g., interferes with normal activities) AND [2] present > 3 days  Answer Assessment - Initial Assessment Questions 1. ONSET: "When did the pain start?"      Right finger started getting sore end of last week.  2. LOCATION and RADIATION: "Where is the pain located?"  (e.g., fingertip, around nail, joint, entire  finger)      Right pinky finger, around in between cuticle and knuckle 3. SEVERITY: "How bad is the pain?" "What does it keep you from doing?"   (Scale 1-10; or mild, moderate, severe)  - MILD (1-3): doesn't interfere with normal activities.   - MODERATE (4-7): interferes with normal activities or awakens from sleep.  - SEVERE (8-10): excruciating pain, unable to hold a glass of water or bend finger even a little.     8; sore to bend 4. APPEARANCE: "What does the finger look like?" (e.g., redness, swelling, bruising, pallor)     Red, swollen, and "looks infected" 5. WORK OR EXERCISE: "Has there been any recent work or exercise that involved this part (i.e., fingers or hand) of the body?"     No 6. CAUSE: "What do you think is causing the pain?"     Unsure 7. AGGRAVATING FACTORS: "What makes the pain worse?" (e.g., using computer)     Tender to touch 8. OTHER SYMPTOMS: "Do you have any other symptoms?" (e.g., fever, neck pain, numbness)     No 9. PREGNANCY: "Is there any chance you are pregnant?"  "When was your last menstrual period?"     No  Protocols used: Finger Pain-A-AH

## 2023-10-23 ENCOUNTER — Ambulatory Visit: Payer: 59 | Admitting: Urology

## 2023-10-24 ENCOUNTER — Other Ambulatory Visit: Payer: Self-pay | Admitting: Internal Medicine

## 2023-10-24 ENCOUNTER — Other Ambulatory Visit: Payer: Self-pay | Admitting: Urology

## 2023-10-24 ENCOUNTER — Other Ambulatory Visit (HOSPITAL_COMMUNITY): Payer: Self-pay

## 2023-10-24 ENCOUNTER — Other Ambulatory Visit: Payer: Self-pay

## 2023-10-24 DIAGNOSIS — R109 Unspecified abdominal pain: Secondary | ICD-10-CM

## 2023-10-24 DIAGNOSIS — K219 Gastro-esophageal reflux disease without esophagitis: Secondary | ICD-10-CM

## 2023-10-24 DIAGNOSIS — R42 Dizziness and giddiness: Secondary | ICD-10-CM

## 2023-10-24 DIAGNOSIS — N200 Calculus of kidney: Secondary | ICD-10-CM

## 2023-10-24 MED ORDER — OMEPRAZOLE 40 MG PO CPDR
40.0000 mg | DELAYED_RELEASE_CAPSULE | Freq: Every day | ORAL | 1 refills | Status: DC
  Filled 2023-10-24: qty 30, 30d supply, fill #0
  Filled 2023-11-25: qty 30, 30d supply, fill #1
  Filled 2024-01-01: qty 30, 30d supply, fill #2
  Filled 2024-01-24: qty 30, 30d supply, fill #3
  Filled 2024-02-21: qty 30, 30d supply, fill #4
  Filled 2024-03-20: qty 30, 30d supply, fill #5
  Filled ????-??-??: fill #2

## 2023-10-24 MED ORDER — MECLIZINE HCL 25 MG PO TABS
25.0000 mg | ORAL_TABLET | Freq: Two times a day (BID) | ORAL | 0 refills | Status: DC | PRN
  Filled 2023-10-24: qty 30, 15d supply, fill #0

## 2023-10-24 MED ORDER — GABAPENTIN 600 MG PO TABS
600.0000 mg | ORAL_TABLET | Freq: Three times a day (TID) | ORAL | 2 refills | Status: DC
  Filled 2023-10-24: qty 90, 30d supply, fill #0
  Filled 2023-11-25: qty 90, 30d supply, fill #1
  Filled 2024-01-01: qty 90, 30d supply, fill #2
  Filled ????-??-??: fill #2

## 2023-10-28 ENCOUNTER — Telehealth: Payer: Self-pay | Admitting: Internal Medicine

## 2023-10-28 NOTE — Telephone Encounter (Signed)
Copied from CRM [id number]. Topic: Referral - Status >> Oct 28, 2023  8:29 AM Maxwell Marion wrote:     Reason for CRM: Pt stated the referral for her colonoscopy was sent to the wrong doctor. She would like a call back from Dr. Eliane Decree nurse.

## 2023-10-29 ENCOUNTER — Encounter: Payer: Self-pay | Admitting: Pharmacist

## 2023-10-29 ENCOUNTER — Other Ambulatory Visit (HOSPITAL_COMMUNITY): Payer: Self-pay

## 2023-10-29 ENCOUNTER — Other Ambulatory Visit: Payer: Self-pay

## 2023-10-29 MED ORDER — CYCLOBENZAPRINE HCL 10 MG PO TABS
10.0000 mg | ORAL_TABLET | Freq: Three times a day (TID) | ORAL | 0 refills | Status: DC | PRN
  Filled 2023-10-29: qty 30, 10d supply, fill #0

## 2023-10-30 ENCOUNTER — Other Ambulatory Visit (HOSPITAL_COMMUNITY): Payer: Self-pay

## 2023-10-31 ENCOUNTER — Other Ambulatory Visit: Payer: Self-pay

## 2023-11-01 ENCOUNTER — Other Ambulatory Visit (HOSPITAL_COMMUNITY): Payer: 59

## 2023-11-13 ENCOUNTER — Telehealth (INDEPENDENT_AMBULATORY_CARE_PROVIDER_SITE_OTHER): Payer: Self-pay | Admitting: *Deleted

## 2023-11-13 NOTE — Telephone Encounter (Signed)
Thank, OK to schedule with me

## 2023-11-13 NOTE — Telephone Encounter (Signed)
Patient was referred to [facility] for TCS, she is having issues and would like to schedule apt here with you since her husband sees you -  please advise

## 2023-11-13 NOTE — Telephone Encounter (Signed)
Thanks

## 2023-11-14 ENCOUNTER — Encounter (INDEPENDENT_AMBULATORY_CARE_PROVIDER_SITE_OTHER): Payer: Self-pay | Admitting: *Deleted

## 2023-11-15 ENCOUNTER — Other Ambulatory Visit (HOSPITAL_COMMUNITY): Payer: Self-pay

## 2023-11-18 ENCOUNTER — Ambulatory Visit (INDEPENDENT_AMBULATORY_CARE_PROVIDER_SITE_OTHER): Payer: 59 | Admitting: Gastroenterology

## 2023-11-25 ENCOUNTER — Other Ambulatory Visit: Payer: Self-pay | Admitting: Internal Medicine

## 2023-11-25 ENCOUNTER — Other Ambulatory Visit: Payer: Self-pay | Admitting: Urology

## 2023-11-25 ENCOUNTER — Other Ambulatory Visit (HOSPITAL_COMMUNITY): Payer: Self-pay

## 2023-11-25 DIAGNOSIS — F5101 Primary insomnia: Secondary | ICD-10-CM

## 2023-11-25 DIAGNOSIS — R109 Unspecified abdominal pain: Secondary | ICD-10-CM

## 2023-11-25 DIAGNOSIS — N200 Calculus of kidney: Secondary | ICD-10-CM

## 2023-11-26 ENCOUNTER — Other Ambulatory Visit: Payer: Self-pay

## 2023-11-26 ENCOUNTER — Other Ambulatory Visit (HOSPITAL_COMMUNITY): Payer: Self-pay

## 2023-11-26 MED ORDER — ZOLPIDEM TARTRATE 5 MG PO TABS
5.0000 mg | ORAL_TABLET | Freq: Every evening | ORAL | 3 refills | Status: DC | PRN
  Filled 2023-11-26: qty 30, 30d supply, fill #0
  Filled 2024-01-01: qty 30, 30d supply, fill #1
  Filled 2024-01-24 – 2024-01-29 (×2): qty 30, 30d supply, fill #2
  Filled 2024-03-20: qty 30, 30d supply, fill #3
  Filled ????-??-??: fill #1

## 2023-11-26 MED ORDER — CYCLOBENZAPRINE HCL 10 MG PO TABS
10.0000 mg | ORAL_TABLET | Freq: Three times a day (TID) | ORAL | 0 refills | Status: DC | PRN
  Filled 2023-11-26: qty 30, 10d supply, fill #0

## 2023-11-28 ENCOUNTER — Ambulatory Visit (INDEPENDENT_AMBULATORY_CARE_PROVIDER_SITE_OTHER): Payer: 59 | Admitting: "Endocrinology

## 2023-11-28 ENCOUNTER — Encounter: Payer: Self-pay | Admitting: "Endocrinology

## 2023-11-28 VITALS — BP 110/72 | HR 64 | Ht <= 58 in | Wt 171.6 lb

## 2023-11-28 DIAGNOSIS — Z794 Long term (current) use of insulin: Secondary | ICD-10-CM | POA: Diagnosis not present

## 2023-11-28 DIAGNOSIS — Z7985 Long-term (current) use of injectable non-insulin antidiabetic drugs: Secondary | ICD-10-CM

## 2023-11-28 DIAGNOSIS — E1169 Type 2 diabetes mellitus with other specified complication: Secondary | ICD-10-CM

## 2023-11-28 DIAGNOSIS — Z6838 Body mass index (BMI) 38.0-38.9, adult: Secondary | ICD-10-CM

## 2023-11-28 DIAGNOSIS — E782 Mixed hyperlipidemia: Secondary | ICD-10-CM | POA: Diagnosis not present

## 2023-11-28 DIAGNOSIS — E66812 Obesity, class 2: Secondary | ICD-10-CM | POA: Diagnosis not present

## 2023-11-28 DIAGNOSIS — I10 Essential (primary) hypertension: Secondary | ICD-10-CM

## 2023-11-28 NOTE — Progress Notes (Signed)
11/28/2023, 4:45 PM   Endocrinology follow-up note  Subjective:    Patient ID: [name], female    DOB: [birth year].  [name] is being seen in follow up after she was seen in consultation for management of currently uncontrolled symptomatic diabetes requested by  Anabel Halon, MD.   Past Medical History:  Diagnosis Date   Diabetes mellitus    insulin pump 2013   Dysphagia 03/09/2020   Epidural hematoma (HCC) 12/14/2021   GERD (gastroesophageal reflux disease)    History of kidney stones    Hypercholesteremia    Hypertension    Incarcerated umbilical hernia 03/25/2014   Kidney stone    Kidney stones 08/31/2008   Qualifier: Diagnosis of  By: Erby Pian MD, Cornelius     Neuropathy    Obesity    Palpitations    Shortness of breath    Vertigo     Past Surgical History:  Procedure Laterality Date   BIOPSY  06/23/2020   Procedure: BIOPSY;  Surgeon: Corbin Ade, MD;  [facility];  Service: Endoscopy;;   CARDIAC CATHETERIZATION  12/11/2012   normal coronary arteries   CESAREAN SECTION  1994;2000   [facility]   CHOLECYSTECTOMY  1992   COLONOSCOPY WITH PROPOFOL N/A 06/23/2020   large amount of stool in entire colon, prep inadequate.    CYSTOSCOPY W/ URETERAL STENT PLACEMENT  05/08/2012   Procedure: CYSTOSCOPY WITH RETROGRADE PYELOGRAM/URETERAL STENT PLACEMENT;  Surgeon: Ky Barban, MD;  Location: [facility];  Service: Urology;  Laterality: Right;   CYSTOSCOPY WITH RETROGRADE PYELOGRAM, URETEROSCOPY AND STENT PLACEMENT Right 05/04/2020   Procedure: CYSTOSCOPY WITH RIGHT RETROGRADE PYELOGRAM, RIGHT URETEROSCOPY AND RIGHT URETERAL STENT EXCHANGE;  Surgeon: Malen Gauze, MD;  Location: [facility];  Service: Urology;  Laterality: Right;   CYSTOSCOPY WITH RETROGRADE PYELOGRAM, URETEROSCOPY AND STENT PLACEMENT Left 08/08/2020   Procedure: CYSTOSCOPY WITH RETROGRADE PYELOGRAM, URETEROSCOPY WITH LASER  AND STENT PLACEMENT;  Surgeon:  Malen Gauze, MD;  Location: [facility];  Service: Urology;  Laterality: Left;   CYSTOSCOPY WITH RETROGRADE PYELOGRAM, URETEROSCOPY AND STENT PLACEMENT Right 11/08/2020   Procedure: CYSTOSCOPY WITH RIGHT RETROGRADE PYELOGRAM, RIGHT URETEROSCOPY AND STENT PLACEMENT;  Surgeon: Malen Gauze, MD;  Location: [facility];  Service: Urology;  Laterality: Right;   CYSTOSCOPY WITH RETROGRADE PYELOGRAM, URETEROSCOPY AND STENT PLACEMENT Left 12/27/2020   Procedure: CYSTOSCOPY WITH LEFT RETROGRADE PYELOGRAM, LEFT URETEROSCOPY AND LEFT URETERAL STENT PLACEMENT;  Surgeon: Malen Gauze, MD;  Location: [facility];  Service: Urology;  Laterality: Left;   CYSTOSCOPY WITH RETROGRADE PYELOGRAM, URETEROSCOPY AND STENT PLACEMENT Bilateral 03/16/2021   Procedure: CYSTOSCOPY WITH BILATERAL  RETROGRADE PYELOGRAM, BILATERAL URETEROSCOPY AND STENT PLACEMENT ON THE LEFT;  Surgeon: Malen Gauze, MD;  Location: [facility];  Service: Urology;  Laterality: Bilateral;   CYSTOSCOPY WITH RETROGRADE PYELOGRAM, URETEROSCOPY AND STENT PLACEMENT Left 06/28/2022   Procedure: CYSTOSCOPY WITH RETROGRADE PYELOGRAM, URETEROSCOPY AND STENT PLACEMENT;  Surgeon: Malen Gauze, MD;  Location: [facility];  Service: Urology;  Laterality: Left;   CYSTOSCOPY WITH RETROGRADE PYELOGRAM, URETEROSCOPY AND STENT PLACEMENT Bilateral 09/27/2022   Procedure: CYSTOSCOPY WITH BILATERAL RETROGRADE PYELOGRAM, URETEROSCOPY AND STENT PLACEMENT;  Surgeon: Malen Gauze, MD;  Location: [facility];  Service: Urology;  Laterality: Bilateral;  pt knows to arrive at 6:00   CYSTOSCOPY WITH STENT PLACEMENT Right 02/25/2020   Procedure: CYSTOSCOPY WITH RIGHT RETROGRADE RIGHT STENT PLACEMENT;  Surgeon: Jerilee Field, MD;  Location: [facility];  Service: Urology;  Laterality: Right;   ESOPHAGOGASTRODUODENOSCOPY (EGD) WITH PROPOFOL N/A 06/23/2020   Normal esophagus, multiple localized erosions were found in gastric antrum s/p biopsy, normal duodenum. Empiric  dilation.Reactive gastropathy with erosions, negative H.pylori, no dysplasia.     EXTRACORPOREAL SHOCK WAVE LITHOTRIPSY Right 10/11/2020   Procedure: EXTRACORPOREAL SHOCK WAVE LITHOTRIPSY (ESWL);  Surgeon: Malen Gauze, MD;  Location: [facility];  Service: Urology;  Laterality: Right;   FOOT SURGERY Right March, 2013   [facility]-removal of bone spur   HOLMIUM LASER APPLICATION  05/04/2020   Procedure: HOLMIUM LASER LITHOTRIPSY RIGHT URETERAL CALCULUS;  Surgeon: Malen Gauze, MD;  Location: [facility];  Service: Urology;;   HYSTEROSCOPY WITH THERMACHOICE  04/25/2012   Procedure: HYSTEROSCOPY WITH THERMACHOICE;  Surgeon: Lazaro Arms, MD;  Location: [facility];  Service: Gynecology;  Laterality: N/A;  total therapy time= 11 minutes 42 seconds; 34 ml D5w  in and 34 ml D5w out; temp =87 degrees F   LEFT HEART CATHETERIZATION WITH CORONARY ANGIOGRAM N/A 12/11/2012   Procedure: LEFT HEART CATHETERIZATION WITH CORONARY ANGIOGRAM;  Surgeon: Runell Gess, MD;  Location: [facility] CATH LAB;  Service: Cardiovascular;  Laterality: N/A;   LUMBAR WOUND DEBRIDEMENT N/A 12/14/2021   Procedure: LUMBAR HEMATOMA EVACUATION;  Surgeon: Jadene Pierini, MD;  Location: [facility];  Service: Neurosurgery;  Laterality: N/A;   MALONEY DILATION N/A 06/23/2020   Procedure: [name] DILATION;  Surgeon: Corbin Ade, MD;  [facility];  Service: Endoscopy;  Laterality: N/A;   Sinus sergery  1988   [place]   STONE EXTRACTION WITH BASKET Right 11/08/2020   Procedure: STONE EXTRACTION WITH BASKET;  Surgeon: Malen Gauze, MD;  Location: [facility];  Service: Urology;  Laterality: Right;   TRANSFORAMINAL LUMBAR INTERBODY FUSION (TLIF) WITH PEDICLE SCREW FIXATION 1 LEVEL N/A 12/05/2021   Procedure: Open Lumbar five-Sacral one Laminectomy/Transforaminal Lumbar Interbody Fusion/Posterolateral instrumented fusion;  Surgeon: Jadene Pierini, MD;  Location: [facility];  Service: Neurosurgery;  Laterality: N/A;  3C    UMBILICAL HERNIA REPAIR N/A 03/25/2014   Procedure: UMBILICAL HERNIA REPAIR;  Surgeon: Marlane Hatcher, MD;  Location: [facility];  Service: General;  Laterality: N/A;   uterine ablation      Social History   Socioeconomic History   Marital status: Married    Spouse name: Not on file   Number of children: 2   Years of education: college   Highest education level: Some college, no degree  Occupational History   Occupation: Scientist, research (medical): Audiological scientist: [employer]   Occupation: Lawyer- at [employer]  Tobacco Use   Smoking status: Never   Smokeless tobacco: Never  Vaping Use   Vaping status: Never Used  Substance and Sexual Activity   Alcohol use: No   Drug use: No   Sexual activity: Yes    Partners: Male    Birth control/protection: Post-menopausal    Comment: ablation  Other Topics Concern   Not on file  Social History Narrative   Regular exercise: walks Caffeine use:    Social Drivers of Corporate investment banker Strain: Low Risk  (04/12/2023)   Overall Financial Resource Strain (CARDIA)    Difficulty of Paying Living Expenses: Not hard at all  Food Insecurity: No Food Insecurity (04/12/2023)   Hunger Vital Sign    Worried About Running Out of Food in the Last Year: Never true    Ran Out of Food in  the Last Year: Never true  Transportation Needs: No Transportation Needs (04/12/2023)   PRAPARE - Administrator, Civil Service (Medical): No    Lack of Transportation (Non-Medical): No  Physical Activity: Insufficiently Active (04/12/2023)   Exercise Vital Sign    Days of Exercise per Week: 2 days    Minutes of Exercise per Session: 10 min  Stress: Stress Concern Present (04/12/2023)   [employer] of Occupational Health - Occupational Stress Questionnaire    Feeling of Stress : To some extent  Social Connections: Moderately Integrated (04/12/2023)   Social Connection and Isolation Panel [NHANES]    Frequency of Communication with  Friends and Family: More than three times a week    Frequency of Social Gatherings with Friends and Family: Once a week    Attends Religious Services: 1 to 4 times per year    Active Member of [employer] or Organizations: No    Attends Engineer, structural: Never    Marital Status: Married    Family History  Problem Relation Age of Onset   Depression Paternal Grandfather    Depression Paternal Grandmother    Diabetes Father    Hypertension Mother    Cancer Mother    Diabetes Mother    Stroke Mother    Alzheimer's disease Mother    Cancer Paternal Uncle    Heart disease Other    Cancer Other    Diabetes Other    Achalasia Other    Anesthesia problems Neg Hx    Hypotension Neg Hx    Malignant hyperthermia Neg Hx    Pseudochol deficiency Neg Hx    Colon cancer Neg Hx    Colon polyps Neg Hx     Outpatient Encounter Medications as of 11/28/2023  Medication Sig   albuterol (VENTOLIN HFA) 108 (90 Base) MCG/ACT inhaler Inhale 2 puffs into the lungs every 6 (six) hours as needed for wheezing or shortness of breath.    blood glucose meter kit and supplies Use up to four times daily as directed.   citalopram (CELEXA) 10 MG tablet Take 1 tablet (10 mg total) by mouth daily.   cyclobenzaprine (FLEXERIL) 10 MG tablet Take 1 tablet (10 mg total) by mouth 3 (three) times daily as needed for muscle spasms.   Dulaglutide (TRULICITY) 4.5 MG/0.5ML SOAJ Inject 4.5 mg as directed once a week.   Empagliflozin-metFORMIN HCl (SYNJARDY) 12.5-500 MG TABS Take 1 tablet by mouth daily.   furosemide (LASIX) 20 MG tablet Take 20 mg by mouth 2 (two) times daily as needed for edema.   gabapentin (NEURONTIN) 600 MG tablet Take 1 tablet (600 mg total) by mouth 3 (three) times daily.   glucose blood test strip Test up to 4 times a day   Lancets (FREESTYLE) lancets Use to test 4 times daily   lisinopril-hydrochlorothiazide (ZESTORETIC) 20-12.5 MG tablet Take 1 tablet by mouth daily.   meclizine (ANTIVERT)  25 MG tablet Take 1 tablet (25 mg total) by mouth 2 (two) times daily as needed for dizziness.   meloxicam (MOBIC) 7.5 MG tablet Take 1 tablet (7.5 mg total) by mouth daily.   methylPREDNISolone (MEDROL DOSEPAK) 4 MG TBPK tablet Take as directed on packaging.   omeprazole (PRILOSEC) 40 MG capsule Take 1 capsule (40 mg total) by mouth daily.   ondansetron (ZOFRAN) 4 MG tablet Take 1 tablet (4 mg total) by mouth daily as needed for nausea or vomiting.   oxyCODONE-acetaminophen (PERCOCET/ROXICET) 5-325 MG tablet Take 1 tablet by  mouth every 8 (eight) hours as needed for severe pain.   promethazine (PHENERGAN) 25 MG tablet TAKE 1 TABLET(25 MG) BY MOUTH EVERY 8 HOURS AS NEEDED FOR NAUSEA OR VOMITING   simvastatin (ZOCOR) 40 MG tablet Take 1 tablet (40 mg total) by mouth every evening for cholesterol   zolpidem (AMBIEN) 5 MG tablet Take 1 tablet (5 mg total) by mouth at bedtime as needed for sleep.   [DISCONTINUED] Potassium Citrate (UROCIT-K 15) 15 MEQ (1620 MG) TBCR Take 1 tablet by mouth in the morning and at bedtime.   No facility-administered encounter medications on file as of 11/28/2023.    ALLERGIES: Allergies  Allergen Reactions   Ciprofloxacin Hives and Swelling   Penicillins Anaphylaxis and Rash   Codeine Other (See Comments)    had hallicunations   Morphine Nausea And Vomiting and Other (See Comments)    recieved in ED due to [facility] and had multple doses - gave visual hallucinations and vomitting.   Sulfonamide Derivatives Other (See Comments)    REACTION: Unsure - childhood allergy   Vancomycin Itching    VACCINATION STATUS: Immunization History  Administered Date(s) Administered   H1N1 09/29/2008   Influenza Split 08/25/2012   Influenza Whole 08/31/2008   Influenza,inj,Quad PF,6+ Mos 10/19/2013, 08/30/2021, 09/13/2022, 09/26/2023   Moderna Covid-19 Vaccine Bivalent Booster 61yrs & up 09/09/2021   Moderna Sars-Covid-2 Vaccination 01/05/2020, 02/02/2020   Pneumococcal  Polysaccharide-23 09/14/2008, 02/21/2014   Tdap 06/28/2020   Zoster Recombinant(Shingrix) 04/04/2022, 10/08/2022    Diabetes She presents for her follow-up diabetic visit. She has type 2 diabetes mellitus. Her disease course has been improving. Pertinent negatives for hypoglycemia include no confusion, headaches, pallor or seizures. Pertinent negatives for diabetes include no chest pain, no polydipsia, no polyphagia and no polyuria. Symptoms are stable. There are no diabetic complications. Risk factors for coronary artery disease include dyslipidemia, diabetes mellitus, obesity, sedentary lifestyle and hypertension. Her weight is increasing steadily. She is following a generally unhealthy diet. When asked about meal planning, she reported none. Her home blood glucose trend is fluctuating minimally. (She presents with controlled glycemic profile.  Her point-of-care A1c is 5.9%, progressively improving overall from 10.2%.  This is despite the fact that she was taken off of insulin.  She is tolerating Trulicity and Synjardy.  ) An ACE inhibitor/angiotensin II receptor blocker is being taken.  Hyperlipidemia This is a chronic problem. Exacerbating diseases include diabetes. Pertinent negatives include no chest pain, myalgias or shortness of breath. Current antihyperlipidemic treatment includes statins. Risk factors for coronary artery disease include diabetes mellitus, dyslipidemia, hypertension, obesity, family history and a sedentary lifestyle.  Hypertension This is a chronic problem. The current episode started more than 1 year ago. The problem is controlled. Pertinent negatives include no chest pain, headaches, palpitations or shortness of breath. Risk factors for coronary artery disease include dyslipidemia, diabetes mellitus, post-menopausal state, sedentary lifestyle, family history and obesity. Past treatments include ACE inhibitors and diuretics.    Review of Systems  Constitutional:  Negative  for chills, fever and unexpected weight change.  HENT:  Negative for trouble swallowing and voice change.   Eyes:  Negative for visual disturbance.  Respiratory:  Negative for cough, shortness of breath and wheezing.   Cardiovascular:  Negative for chest pain, palpitations and leg swelling.  Gastrointestinal:  Negative for diarrhea, nausea and vomiting.  Endocrine: Negative for cold intolerance, heat intolerance, polydipsia, polyphagia and polyuria.  Musculoskeletal:  Negative for arthralgias and myalgias.  Skin:  Negative for color change, pallor,  rash and wound.  Neurological:  Negative for seizures and headaches.  Psychiatric/Behavioral:  Negative for confusion and suicidal ideas.     Objective:       11/28/2023    3:14 PM 10/18/2023   12:42 PM 10/17/2023    3:29 PM  Vitals with BMI  Height 4\' 9"   4\' 9"   Weight 171 lbs 10 oz  170 lbs 10 oz  BMI 37.12  36.91  Systolic 110 115 161  Diastolic 72 75 75  Pulse 64 82 93    BP 110/72   Pulse 64   Ht 4\' 9"  (1.448 m)   Wt 171 lb 9.6 oz (77.8 kg)   LMP 07/16/2012   BMI 37.13 kg/m   Wt Readings from Last 3 Encounters:  11/28/23 171 lb 9.6 oz (77.8 kg)  10/17/23 170 lb 9.6 oz (77.4 kg)  06/17/23 173 lb (78.5 kg)      CMP ( most recent) CMP     Component Value Date/Time   NA 140 05/28/2023 1513   K 4.3 05/28/2023 1513   CL 99 05/28/2023 1513   CO2 28 05/28/2023 1513   GLUCOSE 119 (H) 05/28/2023 1513   GLUCOSE 113 (H) 09/25/2022 1449   BUN 14 05/28/2023 1513   CREATININE 0.73 05/28/2023 1513   CREATININE 0.89 05/25/2020 1626   CALCIUM 9.4 05/28/2023 1513   PROT 6.9 05/28/2023 1513   ALBUMIN 4.4 05/28/2023 1513   AST 26 05/28/2023 1513   ALT 22 05/28/2023 1513   ALKPHOS 118 05/28/2023 1513   BILITOT 0.5 05/28/2023 1513   GFRNONAA >60 09/25/2022 1449   GFRAA >60 08/05/2020 1248    Diabetic Labs (most recent): Lab Results  Component Value Date   HGBA1C 6.5 05/29/2023   HGBA1C 6.2 (H) 09/25/2022   HGBA1C 6.5  06/07/2022   MICROALBUR 10 11/27/2022   MICROALBUR 1.10 11/14/2012     Lipid Panel ( most recent) Lipid Panel     Component Value Date/Time   CHOL 144 05/28/2023 1513   TRIG 230 (H) 05/28/2023 1513   HDL 50 05/28/2023 1513   CHOLHDL 2.9 05/28/2023 1513   CHOLHDL 4.0 07/20/2013 1100   VLDL 48 (H) 07/20/2013 1100   LDLCALC 57 05/28/2023 1513   LABVLDL 37 05/28/2023 1513      Lab Results  Component Value Date   TSH 1.980 05/28/2023   TSH 1.960 02/06/2023   TSH 1.110 05/24/2022   TSH 1.438 11/18/2012   TSH 4.326 09/01/2008   FREET4 1.04 05/28/2023   FREET4 0.87 02/06/2023   FREET4 0.87 05/24/2022     Assessment & Plan:   Type 2 diabetes, unspecified complication using insulin pump  - REIANA TEMPLE has currently uncontrolled symptomatic type 2 DM since  57 years of age,.  She presents with controlled glycemic profile.  Her point-of-care A1c is 5.9%, progressively improving overall from 10.2%.  This is despite the fact that she was taken off of insulin.  She is tolerating Trulicity and Synjardy.        She has not achieved significant excess after she was taken off of insulin. - I had a long discussion with her about the progressive nature of diabetes and the pathology behind its complications.  She does not report gross complications from her diabetes, however she remains at a high risk for more acute and chronic complications which include CAD, CVA, CKD, retinopathy, and neuropathy. These are all discussed in detail with her.  - I discussed all available options of  managing her diabetes including de-escalation of medications. I have counseled her on diet  and weight management  by adopting a Whole Food , Plant Predominant  ( WFPP) nutrition as recommended by [employer] of Lifestyle Medicine. Patient is encouraged to switch to  unprocessed or minimally processed  complex starch, adequate protein intake (mainly plant source), minimal liquid fat ( mainly vegetable oils),  plenty of fruits, and vegetables. -  she is advised to stick to a routine mealtimes to eat 3 complete meals a day and snack only when necessary ( to snack only to correct hypoglycemia BG <70 day time or <100 at night).   - she acknowledges that there is a room for improvement in her food and drink choices. - Suggestion is made for her to avoid simple carbohydrates  from her diet including Cakes, Sweet Desserts, Ice Cream, Soda (diet and regular), Sweet Tea, Candies, Chips, Cookies, Store Bought Juices, Alcohol , Artificial Sweeteners,  Coffee Creamer, and "Sugar-free" Products, Lemonade. This will help patient to have more stable blood glucose profile and potentially avoid unintended weight gain.  The following Lifestyle Medicine recommendations according to American College of Lifestyle Medicine  [facility]) were discussed and and offered to patient and she  agrees to start the journey:  A. Whole Foods, Plant-Based Nutrition comprising of fruits and vegetables, plant-based proteins, whole-grain carbohydrates was discussed in detail with the patient.   A list for source of those nutrients were also provided to the patient.  Patient will use only water or unsweetened tea for hydration. B.  The need to stay away from risky substances including alcohol, smoking; obtaining 7 to 9 hours of restorative sleep, at least 150 minutes of moderate intensity exercise weekly, the importance of healthy social connections,  and stress management techniques were discussed. C.  A full color page of  Calorie density of various food groups per pound showing examples of each food groups was provided to the patient.   - she will be scheduled with Norm Salt, RDN, CDE for individualized diabetes education.  She has successfully avoided insulin for several months now. She is advised to continue Trulicity 4.5 mg subcutaneously weekly.  She is also benefiting from Staples, advised to continue Synjardy 12.5/500 mg once a day  at breakfast.  - she is encouraged to call clinic for blood glucose levels less than 70 or above 150 mg per DL at fasting.     - Specific targets for  A1c;  LDL, HDL,  and Triglycerides were discussed with the patient.  2) Blood Pressure /Hypertension:    --Her blood pressure is controlled to target.  she is advised to take half tablet of her current lisinopril/HCTZ 20-12 0.5 mg p.o. daily with breakfast .  3) Lipids/Hyperlipidemia:   Review of her recent lipid panel showed improved LDL at 57.  This is a significant improvement from 113.  She is advised to continue simvastatin 40 mg p.o. daily at bedtime.       Side effects and precautions discussed with her.  4)  Weight/Diet:  Body mass index is 37.13 kg/m.  -She has lost 21 pounds so far.  Clearly complicating her diabetes care.   she is  a candidate for weight loss. I discussed with her the fact that loss of 5 - 10% of her  current body weight will have the most impact on her diabetes management.  The above detailed  [facility] recommendations for nutrition, exercise, sleep, social life, avoidance of risky substances, the  need for restorative sleep   information will also detailed on discharge instructions.  5) Chronic Care/Health Maintenance:  -she  is on ACEI/ARB and Statin medications and  is encouraged to initiate and continue to follow up with Ophthalmology, Dentist,  Podiatrist at least yearly or according to recommendations, and advised to   stay away from smoking. I have recommended yearly flu vaccine and pneumonia vaccine at least every 5 years; moderate intensity exercise for up to 150 minutes weekly; and  sleep for 7- 9 hours a day.  - she is  advised to maintain close follow up with Anabel Halon, MD for primary care needs, as well as her other providers for optimal and coordinated care.   I spent  25  minutes in the care of the patient today including review of labs from CMP, Lipids, Thyroid Function, Hematology (current and  previous including abstractions from other facilities); face-to-face time discussing  her blood glucose readings/logs, discussing hypoglycemia and hyperglycemia episodes and symptoms, medications doses, her options of short and long term treatment based on the latest standards of care / guidelines;  discussion about incorporating lifestyle medicine;  and documenting the encounter. Risk reduction counseling performed per USPSTF guidelines to reduce  obesity and cardiovascular risk factors.     Please refer to Patient Instructions for Blood Glucose Monitoring and Insulin/Medications Dosing Guide"  in media tab for additional information. Please  also refer to " Patient Self Inventory" in the Media  tab for reviewed elements of pertinent patient history.  [name] participated in the discussions, expressed understanding, and voiced agreement with the above plans.  All questions were answered to her satisfaction. she is encouraged to contact clinic should she have any questions or concerns prior to her return visit.      Follow up plan: - Return in about 4 months (around 03/28/2024) for F/U with Pre-visit Labs, Meter/CGM/Logs, A1c here.  Marquis Lunch, MD [facility] [address] [place], [place] [address] Phone: [phone]  Fax: [phone]    11/28/2023, 4:45 PM  This note was partially dictated with voice recognition software. Similar sounding words can be transcribed inadequately or may not  be corrected upon review.

## 2023-11-28 NOTE — Patient Instructions (Signed)

## 2023-12-06 ENCOUNTER — Other Ambulatory Visit: Payer: Self-pay | Admitting: Urology

## 2023-12-06 ENCOUNTER — Other Ambulatory Visit (HOSPITAL_COMMUNITY): Payer: Self-pay

## 2023-12-06 DIAGNOSIS — N200 Calculus of kidney: Secondary | ICD-10-CM

## 2023-12-06 DIAGNOSIS — R109 Unspecified abdominal pain: Secondary | ICD-10-CM

## 2023-12-09 ENCOUNTER — Other Ambulatory Visit: Payer: Self-pay

## 2023-12-09 MED ORDER — CYCLOBENZAPRINE HCL 10 MG PO TABS
10.0000 mg | ORAL_TABLET | Freq: Three times a day (TID) | ORAL | 0 refills | Status: DC | PRN
  Filled 2023-12-09: qty 30, 10d supply, fill #0

## 2023-12-12 ENCOUNTER — Ambulatory Visit (HOSPITAL_COMMUNITY)
Admission: RE | Admit: 2023-12-12 | Discharge: 2023-12-12 | Disposition: A | Discharge status: Home / Self Care | Payer: 59 | Source: Ambulatory Visit | Attending: Gastroenterology | Admitting: Gastroenterology

## 2023-12-12 ENCOUNTER — Ambulatory Visit (INDEPENDENT_AMBULATORY_CARE_PROVIDER_SITE_OTHER): Payer: 59 | Admitting: Gastroenterology

## 2023-12-12 ENCOUNTER — Encounter (INDEPENDENT_AMBULATORY_CARE_PROVIDER_SITE_OTHER): Payer: Self-pay | Admitting: Gastroenterology

## 2023-12-12 VITALS — BP 110/70 | HR 85 | Temp 97.9°F | Ht <= 58 in | Wt 169.6 lb

## 2023-12-12 DIAGNOSIS — R194 Change in bowel habit: Secondary | ICD-10-CM

## 2023-12-12 DIAGNOSIS — Z981 Arthrodesis status: Secondary | ICD-10-CM | POA: Diagnosis not present

## 2023-12-12 DIAGNOSIS — Z1211 Encounter for screening for malignant neoplasm of colon: Secondary | ICD-10-CM

## 2023-12-12 DIAGNOSIS — K5900 Constipation, unspecified: Secondary | ICD-10-CM | POA: Diagnosis not present

## 2023-12-12 NOTE — Patient Instructions (Signed)
 Schedule KUB Start taking Miralax  1 capful every day for one week. If bowel movements do not improve, increase to 2 capfuls every day. If after two weeks there is no improvement, increase to 2 capfuls in AM and one at night. Continue with Colace for now Schedule colonoscopy

## 2023-12-12 NOTE — H&P (View-Only) (Signed)
Katrinka Blazing, M.D. Gastroenterology & Hepatology [facility] [address] [place], [place] [address] Primary Care Physician: Anabel Halon, MD [address] [place] [address]  Referring MD: PCP  Chief Complaint: Diarrhea  History of Present Illness: [name] is a 58 y.o. female with past medical history of diabetes, hypertension, hyperlipidemia, obesity, who presents for evaluation of changes in her bowel movement consistency.  Patient reports that she has had issues with constipation for the last 6 months or so. This progressed into some intermittent issues with diarrhea, which is present for a few days. States that this is characterized by watery Bms, can have 2-3 Bms when this happens. She always feels she could go more.When she is constipated, she can last 3-4 days, has to strain significantly. She reports that she takes Colace almost every day.  Sometimes has some nausea.  The patient denies having any vomiting, fever, chills, hematochezia, melena, hematemesis, abdominal distention, abdominal pain, jaundice, pruritus. Has been watching was she eats as she is diabetic.  Last EGD:06/23/2020, performed by Dr. Jena Gauss Multiple erosions in the gastric antrum, empiric dilation of the esophagus was performed.  Biopsies of the stomach were negative for H. pylori. Last Colonoscopy: 06/23/2020, performed by Dr. Jena Gauss Inadequate preparation of the colon precluding visualization  FHx: neg for any gastrointestinal/liver disease, grandmother breast cancer, grandmodther stomach cancer, uncle throat and lung cancer Social: neg smoking, alcohol or illicit drug use Surgical: cholecystectomy  Past Medical History: Past Medical History:  Diagnosis Date   Diabetes mellitus    insulin pump 2013   Dysphagia 03/09/2020   Epidural hematoma (HCC) 12/14/2021   GERD (gastroesophageal reflux disease)    History of kidney stones     Hypercholesteremia    Hypertension    Incarcerated umbilical hernia 03/25/2014   Kidney stone    Kidney stones 08/31/2008   Qualifier: Diagnosis of  By: Erby Pian MD, Cornelius     Neuropathy    Obesity    Palpitations    Shortness of breath    Vertigo     Past Surgical History: Past Surgical History:  Procedure Laterality Date   BIOPSY  06/23/2020   Procedure: BIOPSY;  Surgeon: Corbin Ade, MD;  [facility];  Service: Endoscopy;;   CARDIAC CATHETERIZATION  12/11/2012   normal coronary arteries   CESAREAN SECTION  1994;2000   [facility]   CHOLECYSTECTOMY  1992   COLONOSCOPY WITH PROPOFOL N/A 06/23/2020   large amount of stool in entire colon, prep inadequate.    CYSTOSCOPY W/ URETERAL STENT PLACEMENT  05/08/2012   Procedure: CYSTOSCOPY WITH RETROGRADE PYELOGRAM/URETERAL STENT PLACEMENT;  Surgeon: Ky Barban, MD;  Location: [facility];  Service: Urology;  Laterality: Right;   CYSTOSCOPY WITH RETROGRADE PYELOGRAM, URETEROSCOPY AND STENT PLACEMENT Right 05/04/2020   Procedure: CYSTOSCOPY WITH RIGHT RETROGRADE PYELOGRAM, RIGHT URETEROSCOPY AND RIGHT URETERAL STENT EXCHANGE;  Surgeon: Malen Gauze, MD;  Location: [facility];  Service: Urology;  Laterality: Right;   CYSTOSCOPY WITH RETROGRADE PYELOGRAM, URETEROSCOPY AND STENT PLACEMENT Left 08/08/2020   Procedure: CYSTOSCOPY WITH RETROGRADE PYELOGRAM, URETEROSCOPY WITH LASER  AND STENT PLACEMENT;  Surgeon: Malen Gauze, MD;  Location: [facility];  Service: Urology;  Laterality: Left;   CYSTOSCOPY WITH RETROGRADE PYELOGRAM, URETEROSCOPY AND STENT PLACEMENT Right 11/08/2020   Procedure: CYSTOSCOPY WITH RIGHT RETROGRADE PYELOGRAM, RIGHT URETEROSCOPY AND STENT PLACEMENT;  Surgeon: Malen Gauze, MD;  Location: [facility];  Service: Urology;  Laterality: Right;   CYSTOSCOPY  WITH RETROGRADE PYELOGRAM, URETEROSCOPY AND STENT PLACEMENT Left 12/27/2020   Procedure: CYSTOSCOPY WITH LEFT RETROGRADE PYELOGRAM, LEFT URETEROSCOPY AND  LEFT URETERAL STENT PLACEMENT;  Surgeon: Malen Gauze, MD;  Location: [facility];  Service: Urology;  Laterality: Left;   CYSTOSCOPY WITH RETROGRADE PYELOGRAM, URETEROSCOPY AND STENT PLACEMENT Bilateral 03/16/2021   Procedure: CYSTOSCOPY WITH BILATERAL  RETROGRADE PYELOGRAM, BILATERAL URETEROSCOPY AND STENT PLACEMENT ON THE LEFT;  Surgeon: Malen Gauze, MD;  Location: [facility];  Service: Urology;  Laterality: Bilateral;   CYSTOSCOPY WITH RETROGRADE PYELOGRAM, URETEROSCOPY AND STENT PLACEMENT Left 06/28/2022   Procedure: CYSTOSCOPY WITH RETROGRADE PYELOGRAM, URETEROSCOPY AND STENT PLACEMENT;  Surgeon: Malen Gauze, MD;  Location: [facility];  Service: Urology;  Laterality: Left;   CYSTOSCOPY WITH RETROGRADE PYELOGRAM, URETEROSCOPY AND STENT PLACEMENT Bilateral 09/27/2022   Procedure: CYSTOSCOPY WITH BILATERAL RETROGRADE PYELOGRAM, URETEROSCOPY AND STENT PLACEMENT;  Surgeon: Malen Gauze, MD;  Location: [facility];  Service: Urology;  Laterality: Bilateral;  pt knows to arrive at 6:00   CYSTOSCOPY WITH STENT PLACEMENT Right 02/25/2020   Procedure: CYSTOSCOPY WITH RIGHT RETROGRADE RIGHT STENT PLACEMENT;  Surgeon: Jerilee Field, MD;  Location: [facility];  Service: Urology;  Laterality: Right;   ESOPHAGOGASTRODUODENOSCOPY (EGD) WITH PROPOFOL N/A 06/23/2020   Normal esophagus, multiple localized erosions were found in gastric antrum s/p biopsy, normal duodenum. Empiric dilation.Reactive gastropathy with erosions, negative H.pylori, no dysplasia.     EXTRACORPOREAL SHOCK WAVE LITHOTRIPSY Right 10/11/2020   Procedure: EXTRACORPOREAL SHOCK WAVE LITHOTRIPSY (ESWL);  Surgeon: Malen Gauze, MD;  Location: [facility];  Service: Urology;  Laterality: Right;   FOOT SURGERY Right March, 2013   [facility]-removal of bone spur   HOLMIUM LASER APPLICATION  05/04/2020   Procedure: HOLMIUM LASER LITHOTRIPSY RIGHT URETERAL CALCULUS;  Surgeon: Malen Gauze, MD;  Location: [facility];  Service:  Urology;;   HYSTEROSCOPY WITH THERMACHOICE  04/25/2012   Procedure: HYSTEROSCOPY WITH THERMACHOICE;  Surgeon: Lazaro Arms, MD;  Location: [facility];  Service: Gynecology;  Laterality: N/A;  total therapy time= 11 minutes 42 seconds; 34 ml D5w  in and 34 ml D5w out; temp =87 degrees F   LEFT HEART CATHETERIZATION WITH CORONARY ANGIOGRAM N/A 12/11/2012   Procedure: LEFT HEART CATHETERIZATION WITH CORONARY ANGIOGRAM;  Surgeon: Runell Gess, MD;  Location: [facility] CATH LAB;  Service: Cardiovascular;  Laterality: N/A;   LUMBAR WOUND DEBRIDEMENT N/A 12/14/2021   Procedure: LUMBAR HEMATOMA EVACUATION;  Surgeon: Jadene Pierini, MD;  Location: [facility];  Service: Neurosurgery;  Laterality: N/A;   MALONEY DILATION N/A 06/23/2020   Procedure: [name] DILATION;  Surgeon: Corbin Ade, MD;  [facility];  Service: Endoscopy;  Laterality: N/A;   Sinus sergery  1988   [place]   STONE EXTRACTION WITH BASKET Right 11/08/2020   Procedure: STONE EXTRACTION WITH BASKET;  Surgeon: Malen Gauze, MD;  Location: [facility];  Service: Urology;  Laterality: Right;   TRANSFORAMINAL LUMBAR INTERBODY FUSION (TLIF) WITH PEDICLE SCREW FIXATION 1 LEVEL N/A 12/05/2021   Procedure: Open Lumbar five-Sacral one Laminectomy/Transforaminal Lumbar Interbody Fusion/Posterolateral instrumented fusion;  Surgeon: Jadene Pierini, MD;  Location: [facility];  Service: Neurosurgery;  Laterality: N/A;  3C   UMBILICAL HERNIA REPAIR N/A 03/25/2014   Procedure: UMBILICAL HERNIA REPAIR;  Surgeon: Marlane Hatcher, MD;  Location: [facility];  Service: General;  Laterality: N/A;   uterine ablation      Family History: Family History  Problem Relation Age of Onset   Depression Paternal Grandfather  Depression Paternal Grandmother    Diabetes Father    Hypertension Mother    Cancer Mother    Diabetes Mother    Stroke Mother    Alzheimer's disease Mother    Cancer Paternal Uncle    Heart disease Other    Cancer Other     Diabetes Other    Achalasia Other    Anesthesia problems Neg Hx    Hypotension Neg Hx    Malignant hyperthermia Neg Hx    Pseudochol deficiency Neg Hx    Colon cancer Neg Hx    Colon polyps Neg Hx     Social History: Social History   Tobacco Use  Smoking Status Never  Smokeless Tobacco Never   Social History   Substance and Sexual Activity  Alcohol Use No   Social History   Substance and Sexual Activity  Drug Use No    Allergies: Allergies  Allergen Reactions   Ciprofloxacin Hives and Swelling   Penicillins Anaphylaxis and Rash   Codeine Other (See Comments)    had hallicunations   Morphine Nausea And Vomiting and Other (See Comments)    recieved in ED due to [facility] and had multple doses - gave visual hallucinations and vomitting.   Sulfonamide Derivatives Other (See Comments)    REACTION: Unsure - childhood allergy   Vancomycin Itching    Medications: Current Outpatient Medications  Medication Sig Dispense Refill   albuterol (VENTOLIN HFA) 108 (90 Base) MCG/ACT inhaler Inhale 2 puffs into the lungs every 6 (six) hours as needed for wheezing or shortness of breath.      blood glucose meter kit and supplies Use up to four times daily as directed. 1 each 0   citalopram (CELEXA) 10 MG tablet Take 1 tablet (10 mg total) by mouth daily. 30 tablet 3   cyclobenzaprine (FLEXERIL) 10 MG tablet Take 1 tablet (10 mg total) by mouth 3 (three) times daily as needed for muscle spasms. 30 tablet 0   Dulaglutide (TRULICITY) 4.5 MG/0.5ML SOAJ Inject 4.5 mg as directed once a week. 6 mL 1   Empagliflozin-metFORMIN HCl (SYNJARDY) 12.5-500 MG TABS Take 1 tablet by mouth daily. 90 tablet 0   furosemide (LASIX) 20 MG tablet Take 20 mg by mouth 2 (two) times daily as needed for edema.     gabapentin (NEURONTIN) 600 MG tablet Take 1 tablet (600 mg total) by mouth 3 (three) times daily. 90 tablet 2   glucose blood test strip Test up to 4 times a day 100 each 0   Lancets (FREESTYLE)  lancets Use to test 4 times daily 100 each 0   lisinopril-hydrochlorothiazide (ZESTORETIC) 20-12.5 MG tablet Take 1 tablet by mouth daily. 90 tablet 3   meclizine (ANTIVERT) 25 MG tablet Take 1 tablet (25 mg total) by mouth 2 (two) times daily as needed for dizziness. 30 tablet 0   meloxicam (MOBIC) 7.5 MG tablet Take 1 tablet (7.5 mg total) by mouth daily. 30 tablet 5   omeprazole (PRILOSEC) 40 MG capsule Take 1 capsule (40 mg total) by mouth daily. 90 capsule 1   ondansetron (ZOFRAN) 4 MG tablet Take 1 tablet (4 mg total) by mouth daily as needed for nausea or vomiting. 30 tablet 1   promethazine (PHENERGAN) 25 MG tablet TAKE 1 TABLET(25 MG) BY MOUTH EVERY 8 HOURS AS NEEDED FOR NAUSEA OR VOMITING 20 tablet 0   simvastatin (ZOCOR) 40 MG tablet Take 1 tablet (40 mg total) by mouth every evening for cholesterol 90 tablet 3  zolpidem (AMBIEN) 5 MG tablet Take 1 tablet (5 mg total) by mouth at bedtime as needed for sleep. 30 tablet 3   No current facility-administered medications for this visit.    Review of Systems: GENERAL: negative for malaise, night sweats HEENT: No changes in hearing or vision, no nose bleeds or other nasal problems. NECK: Negative for lumps, goiter, pain and significant neck swelling RESPIRATORY: Negative for cough, wheezing CARDIOVASCULAR: Negative for chest pain, leg swelling, palpitations, orthopnea GI: SEE HPI MUSCULOSKELETAL: Negative for joint pain or swelling, back pain, and muscle pain. SKIN: Negative for lesions, rash PSYCH: Negative for sleep disturbance, mood disorder and recent psychosocial stressors. HEMATOLOGY Negative for prolonged bleeding, bruising easily, and swollen nodes. ENDOCRINE: Negative for cold or heat intolerance, polyuria, polydipsia and goiter. NEURO: negative for tremor, gait imbalance, syncope and seizures. The remainder of the review of systems is noncontributory.   Physical Exam: BP 110/70 (BP Location: Left Arm, Patient Position:  Sitting, Cuff Size: Normal)   Pulse 85   Temp 97.9 F (36.6 C) (Oral)   Ht 4\' 9"  (1.448 m)   Wt 169 lb 9.6 oz (76.9 kg)   LMP 07/16/2012   BMI 36.70 kg/m  GENERAL: The patient is AO x3, in no acute distress. HEENT: Head is normocephalic and atraumatic. EOMI are intact. Mouth is well hydrated and without lesions. NECK: Supple. No masses LUNGS: Clear to auscultation. No presence of rhonchi/wheezing/rales. Adequate chest expansion HEART: RRR, normal s1 and s2. ABDOMEN: Soft, nontender, no guarding, no peritoneal signs, and nondistended. BS +. No masses. EXTREMITIES: Without any cyanosis, clubbing, rash, lesions or edema. NEUROLOGIC: AOx3, no focal motor deficit. SKIN: no jaundice, no rashes   Imaging/Labs: as above  I personally reviewed and interpreted the available labs, imaging and endoscopic files.  Impression and Plan: [name] is a 58 y.o. female with past medical history of diabetes, hypertension, hyperlipidemia, obesity, who presents for evaluation of changes in her bowel movement consistency.  Patient has presented fluctuation in her bowel movement frequency.  She was presenting constipation but more recently has had intermittent episodes of diarrhea.  I suspect that part of her symptoms are related to overflow diarrhea, especially as she had a history of unsatisfactory colonoscopy in the past.  Due to this, we will evaluate her symptoms further with a KUB and I will start her on standing MiraLAX.  She may achieve further symptom improvement after receiving bowel prep for colonoscopy, which will be ordered for colorectal cancer screening.  -Schedule KUB -Start taking Miralax 1 capful every day for one week. If bowel movements do not improve, increase to 2 capfuls every day. If after two weeks there is no improvement, increase to 2 capfuls in AM and one at night. -Continue with Colace for now -Schedule colonoscopy  All questions were answered.      Katrinka Blazing,  MD Gastroenterology and Hepatology [facility]

## 2023-12-12 NOTE — Progress Notes (Signed)
 Katrinka Blazing, M.D. Gastroenterology & Hepatology [facility] [address] [place], [place] [address] Primary Care Physician: Anabel Halon, MD [address] [place] [address]  Referring MD: PCP  Chief Complaint: Diarrhea  History of Present Illness: [name] is a 58 y.o. female with past medical history of diabetes, hypertension, hyperlipidemia, obesity, who presents for evaluation of changes in her bowel movement consistency.  Patient reports that she has had issues with constipation for the last 6 months or so. This progressed into some intermittent issues with diarrhea, which is present for a few days. States that this is characterized by watery Bms, can have 2-3 Bms when this happens. She always feels she could go more.When she is constipated, she can last 3-4 days, has to strain significantly. She reports that she takes Colace almost every day.  Sometimes has some nausea.  The patient denies having any vomiting, fever, chills, hematochezia, melena, hematemesis, abdominal distention, abdominal pain, jaundice, pruritus. Has been watching was she eats as she is diabetic.  Last EGD:06/23/2020, performed by Dr. Jena Gauss Multiple erosions in the gastric antrum, empiric dilation of the esophagus was performed.  Biopsies of the stomach were negative for H. pylori. Last Colonoscopy: 06/23/2020, performed by Dr. Jena Gauss Inadequate preparation of the colon precluding visualization  FHx: neg for any gastrointestinal/liver disease, grandmother breast cancer, grandmodther stomach cancer, uncle throat and lung cancer Social: neg smoking, alcohol or illicit drug use Surgical: cholecystectomy  Past Medical History: Past Medical History:  Diagnosis Date   Diabetes mellitus    insulin pump 2013   Dysphagia 03/09/2020   Epidural hematoma (HCC) 12/14/2021   GERD (gastroesophageal reflux disease)    History of kidney stones     Hypercholesteremia    Hypertension    Incarcerated umbilical hernia 03/25/2014   Kidney stone    Kidney stones 08/31/2008   Qualifier: Diagnosis of  By: Erby Pian MD, Cornelius     Neuropathy    Obesity    Palpitations    Shortness of breath    Vertigo     Past Surgical History: Past Surgical History:  Procedure Laterality Date   BIOPSY  06/23/2020   Procedure: BIOPSY;  Surgeon: Corbin Ade, MD;  [facility];  Service: Endoscopy;;   CARDIAC CATHETERIZATION  12/11/2012   normal coronary arteries   CESAREAN SECTION  1994;2000   [facility]   CHOLECYSTECTOMY  1992   COLONOSCOPY WITH PROPOFOL N/A 06/23/2020   large amount of stool in entire colon, prep inadequate.    CYSTOSCOPY W/ URETERAL STENT PLACEMENT  05/08/2012   Procedure: CYSTOSCOPY WITH RETROGRADE PYELOGRAM/URETERAL STENT PLACEMENT;  Surgeon: Ky Barban, MD;  Location: [facility] OR[facility];  Service: Urology;  Laterality: Right;   CYSTOSCOPY WITH RETROGRADE PYELOGRAM, URETEROSCOPY AND STENT PLACEMENT Right 05/04/2020   Procedure: CYSTOSCOPY WITH RIGHT RETROGRADE PYELOGRAM, RIGHT URETEROSCOPY AND RIGHT URETERAL STENT EXCHANGE;  Surgeon: Malen Gauze, MD;  Location: [facility];  Service: Urology;  Laterality: Right;   CYSTOSCOPY WITH RETROGRADE PYELOGRAM, URETEROSCOPY AND STENT PLACEMENT Left 08/08/2020   Procedure: CYSTOSCOPY WITH RETROGRADE PYELOGRAM, URETEROSCOPY WITH LASER  AND STENT PLACEMENT;  Surgeon: Malen Gauze, MD;  Location: [facility];  Service: Urology;  Laterality: Left;   CYSTOSCOPY WITH RETROGRADE PYELOGRAM, URETEROSCOPY AND STENT PLACEMENT Right 11/08/2020   Procedure: CYSTOSCOPY WITH RIGHT RETROGRADE PYELOGRAM, RIGHT URETEROSCOPY AND STENT PLACEMENT;  Surgeon: Malen Gauze, MD;  Location: [facility];  Service: Urology;  Laterality: Right;   CYSTOSCOPY  WITH RETROGRADE PYELOGRAM, URETEROSCOPY AND STENT PLACEMENT Left 12/27/2020   Procedure: CYSTOSCOPY WITH LEFT RETROGRADE PYELOGRAM, LEFT URETEROSCOPY AND  LEFT URETERAL STENT PLACEMENT;  Surgeon: Malen Gauze, MD;  Location: [facility];  Service: Urology;  Laterality: Left;   CYSTOSCOPY WITH RETROGRADE PYELOGRAM, URETEROSCOPY AND STENT PLACEMENT Bilateral 03/16/2021   Procedure: CYSTOSCOPY WITH BILATERAL  RETROGRADE PYELOGRAM, BILATERAL URETEROSCOPY AND STENT PLACEMENT ON THE LEFT;  Surgeon: Malen Gauze, MD;  Location: [facility];  Service: Urology;  Laterality: Bilateral;   CYSTOSCOPY WITH RETROGRADE PYELOGRAM, URETEROSCOPY AND STENT PLACEMENT Left 06/28/2022   Procedure: CYSTOSCOPY WITH RETROGRADE PYELOGRAM, URETEROSCOPY AND STENT PLACEMENT;  Surgeon: Malen Gauze, MD;  Location: [facility];  Service: Urology;  Laterality: Left;   CYSTOSCOPY WITH RETROGRADE PYELOGRAM, URETEROSCOPY AND STENT PLACEMENT Bilateral 09/27/2022   Procedure: CYSTOSCOPY WITH BILATERAL RETROGRADE PYELOGRAM, URETEROSCOPY AND STENT PLACEMENT;  Surgeon: Malen Gauze, MD;  Location: [facility];  Service: Urology;  Laterality: Bilateral;  pt knows to arrive at 6:00   CYSTOSCOPY WITH STENT PLACEMENT Right 02/25/2020   Procedure: CYSTOSCOPY WITH RIGHT RETROGRADE RIGHT STENT PLACEMENT;  Surgeon: Jerilee Field, MD;  Location: [facility];  Service: Urology;  Laterality: Right;   ESOPHAGOGASTRODUODENOSCOPY (EGD) WITH PROPOFOL N/A 06/23/2020   Normal esophagus, multiple localized erosions were found in gastric antrum s/p biopsy, normal duodenum. Empiric dilation.Reactive gastropathy with erosions, negative H.pylori, no dysplasia.     EXTRACORPOREAL SHOCK WAVE LITHOTRIPSY Right 10/11/2020   Procedure: EXTRACORPOREAL SHOCK WAVE LITHOTRIPSY (ESWL);  Surgeon: Malen Gauze, MD;  Location: [facility];  Service: Urology;  Laterality: Right;   FOOT SURGERY Right March, 2013   [facility]-removal of bone spur   HOLMIUM LASER APPLICATION  05/04/2020   Procedure: HOLMIUM LASER LITHOTRIPSY RIGHT URETERAL CALCULUS;  Surgeon: Malen Gauze, MD;  Location: [facility];  Service:  Urology;;   HYSTEROSCOPY WITH THERMACHOICE  04/25/2012   Procedure: HYSTEROSCOPY WITH THERMACHOICE;  Surgeon: Lazaro Arms, MD;  Location: [facility];  Service: Gynecology;  Laterality: N/A;  total therapy time= 11 minutes 42 seconds; 34 ml D5w  in and 34 ml D5w out; temp =87 degrees F   LEFT HEART CATHETERIZATION WITH CORONARY ANGIOGRAM N/A 12/11/2012   Procedure: LEFT HEART CATHETERIZATION WITH CORONARY ANGIOGRAM;  Surgeon: Runell Gess, MD;  Location: [facility] CATH LAB;  Service: Cardiovascular;  Laterality: N/A;   LUMBAR WOUND DEBRIDEMENT N/A 12/14/2021   Procedure: LUMBAR HEMATOMA EVACUATION;  Surgeon: Jadene Pierini, MD;  Location: [facility] OR;  Service: Neurosurgery;  Laterality: N/A;   MALONEY DILATION N/A 06/23/2020   Procedure: [name] DILATION;  Surgeon: Corbin Ade, MD;  [facility];  Service: Endoscopy;  Laterality: N/A;   Sinus sergery  1988   [place]   STONE EXTRACTION WITH BASKET Right 11/08/2020   Procedure: STONE EXTRACTION WITH BASKET;  Surgeon: Malen Gauze, MD;  Location: [facility];  Service: Urology;  Laterality: Right;   TRANSFORAMINAL LUMBAR INTERBODY FUSION (TLIF) WITH PEDICLE SCREW FIXATION 1 LEVEL N/A 12/05/2021   Procedure: Open Lumbar five-Sacral one Laminectomy/Transforaminal Lumbar Interbody Fusion/Posterolateral instrumented fusion;  Surgeon: Jadene Pierini, MD;  Location: [facility] OR;  Service: Neurosurgery;  Laterality: N/A;  3C   UMBILICAL HERNIA REPAIR N/A 03/25/2014   Procedure: UMBILICAL HERNIA REPAIR;  Surgeon: Marlane Hatcher, MD;  Location: [facility];  Service: General;  Laterality: N/A;   uterine ablation      Family History: Family History  Problem Relation Age of Onset   Depression Paternal Grandfather  Depression Paternal Grandmother    Diabetes Father    Hypertension Mother    Cancer Mother    Diabetes Mother    Stroke Mother    Alzheimer's disease Mother    Cancer Paternal Uncle    Heart disease Other    Cancer Other     Diabetes Other    Achalasia Other    Anesthesia problems Neg Hx    Hypotension Neg Hx    Malignant hyperthermia Neg Hx    Pseudochol deficiency Neg Hx    Colon cancer Neg Hx    Colon polyps Neg Hx     Social History: Social History   Tobacco Use  Smoking Status Never  Smokeless Tobacco Never   Social History   Substance and Sexual Activity  Alcohol Use No   Social History   Substance and Sexual Activity  Drug Use No    Allergies: Allergies  Allergen Reactions   Ciprofloxacin Hives and Swelling   Penicillins Anaphylaxis and Rash   Codeine Other (See Comments)    had hallicunations   Morphine Nausea And Vomiting and Other (See Comments)    recieved in ED due to [facility] and had multple doses - gave visual hallucinations and vomitting.   Sulfonamide Derivatives Other (See Comments)    REACTION: Unsure - childhood allergy   Vancomycin Itching    Medications: Current Outpatient Medications  Medication Sig Dispense Refill   albuterol (VENTOLIN HFA) 108 (90 Base) MCG/ACT inhaler Inhale 2 puffs into the lungs every 6 (six) hours as needed for wheezing or shortness of breath.      blood glucose meter kit and supplies Use up to four times daily as directed. 1 each 0   citalopram (CELEXA) 10 MG tablet Take 1 tablet (10 mg total) by mouth daily. 30 tablet 3   cyclobenzaprine (FLEXERIL) 10 MG tablet Take 1 tablet (10 mg total) by mouth 3 (three) times daily as needed for muscle spasms. 30 tablet 0   Dulaglutide (TRULICITY) 4.5 MG/0.5ML SOAJ Inject 4.5 mg as directed once a week. 6 mL 1   Empagliflozin-metFORMIN HCl (SYNJARDY) 12.5-500 MG TABS Take 1 tablet by mouth daily. 90 tablet 0   furosemide (LASIX) 20 MG tablet Take 20 mg by mouth 2 (two) times daily as needed for edema.     gabapentin (NEURONTIN) 600 MG tablet Take 1 tablet (600 mg total) by mouth 3 (three) times daily. 90 tablet 2   glucose blood test strip Test up to 4 times a day 100 each 0   Lancets (FREESTYLE)  lancets Use to test 4 times daily 100 each 0   lisinopril-hydrochlorothiazide (ZESTORETIC) 20-12.5 MG tablet Take 1 tablet by mouth daily. 90 tablet 3   meclizine (ANTIVERT) 25 MG tablet Take 1 tablet (25 mg total) by mouth 2 (two) times daily as needed for dizziness. 30 tablet 0   meloxicam (MOBIC) 7.5 MG tablet Take 1 tablet (7.5 mg total) by mouth daily. 30 tablet 5   omeprazole (PRILOSEC) 40 MG capsule Take 1 capsule (40 mg total) by mouth daily. 90 capsule 1   ondansetron (ZOFRAN) 4 MG tablet Take 1 tablet (4 mg total) by mouth daily as needed for nausea or vomiting. 30 tablet 1   promethazine (PHENERGAN) 25 MG tablet TAKE 1 TABLET(25 MG) BY MOUTH EVERY 8 HOURS AS NEEDED FOR NAUSEA OR VOMITING 20 tablet 0   simvastatin (ZOCOR) 40 MG tablet Take 1 tablet (40 mg total) by mouth every evening for cholesterol 90 tablet 3  zolpidem (AMBIEN) 5 MG tablet Take 1 tablet (5 mg total) by mouth at bedtime as needed for sleep. 30 tablet 3   No current facility-administered medications for this visit.    Review of Systems: GENERAL: negative for malaise, night sweats HEENT: No changes in hearing or vision, no nose bleeds or other nasal problems. NECK: Negative for lumps, goiter, pain and significant neck swelling RESPIRATORY: Negative for cough, wheezing CARDIOVASCULAR: Negative for chest pain, leg swelling, palpitations, orthopnea GI: SEE HPI MUSCULOSKELETAL: Negative for joint pain or swelling, back pain, and muscle pain. SKIN: Negative for lesions, rash PSYCH: Negative for sleep disturbance, mood disorder and recent psychosocial stressors. HEMATOLOGY Negative for prolonged bleeding, bruising easily, and swollen nodes. ENDOCRINE: Negative for cold or heat intolerance, polyuria, polydipsia and goiter. NEURO: negative for tremor, gait imbalance, syncope and seizures. The remainder of the review of systems is noncontributory.   Physical Exam: BP 110/70 (BP Location: Left Arm, Patient Position:  Sitting, Cuff Size: Normal)   Pulse 85   Temp 97.9 F (36.6 C) (Oral)   Ht 4\' 9"  (1.448 m)   Wt 169 lb 9.6 oz (76.9 kg)   LMP 07/16/2012   BMI 36.70 kg/m  GENERAL: The patient is AO x3, in no acute distress. HEENT: Head is normocephalic and atraumatic. EOMI are intact. Mouth is well hydrated and without lesions. NECK: Supple. No masses LUNGS: Clear to auscultation. No presence of rhonchi/wheezing/rales. Adequate chest expansion HEART: RRR, normal s1 and s2. ABDOMEN: Soft, nontender, no guarding, no peritoneal signs, and nondistended. BS +. No masses. EXTREMITIES: Without any cyanosis, clubbing, rash, lesions or edema. NEUROLOGIC: AOx3, no focal motor deficit. SKIN: no jaundice, no rashes   Imaging/Labs: as above  I personally reviewed and interpreted the available labs, imaging and endoscopic files.  Impression and Plan: [name] is a 58 y.o. female with past medical history of diabetes, hypertension, hyperlipidemia, obesity, who presents for evaluation of changes in her bowel movement consistency.  Patient has presented fluctuation in her bowel movement frequency.  She was presenting constipation but more recently has had intermittent episodes of diarrhea.  I suspect that part of her symptoms are related to overflow diarrhea, especially as she had a history of unsatisfactory colonoscopy in the past.  Due to this, we will evaluate her symptoms further with a KUB and I will start her on standing MiraLAX.  She may achieve further symptom improvement after receiving bowel prep for colonoscopy, which will be ordered for colorectal cancer screening.  -Schedule KUB -Start taking Miralax 1 capful every day for one week. If bowel movements do not improve, increase to 2 capfuls every day. If after two weeks there is no improvement, increase to 2 capfuls in AM and one at night. -Continue with Colace for now -Schedule colonoscopy  All questions were answered.      Katrinka Blazing,  MD Gastroenterology and Hepatology [facility]astroenterology

## 2023-12-13 ENCOUNTER — Encounter (INDEPENDENT_AMBULATORY_CARE_PROVIDER_SITE_OTHER): Payer: Self-pay | Admitting: Gastroenterology

## 2023-12-13 ENCOUNTER — Other Ambulatory Visit (HOSPITAL_COMMUNITY): Payer: Self-pay

## 2023-12-13 ENCOUNTER — Other Ambulatory Visit: Payer: Self-pay | Admitting: Internal Medicine

## 2023-12-13 MED ORDER — PEG 3350-KCL-NA BICARB-NACL 420 G PO SOLR: 4000.0000 mL | Freq: Once | ORAL | 0 refills | Status: AC

## 2023-12-13 NOTE — Addendum Note (Signed)
 Addended by: Marlowe Shores on: 12/13/2023 08:35 AM   Modules accepted: Orders

## 2023-12-16 ENCOUNTER — Other Ambulatory Visit: Payer: Self-pay

## 2023-12-16 MED ORDER — SYNJARDY 12.5-500 MG PO TABS
1.0000 | ORAL_TABLET | Freq: Every day | ORAL | 0 refills | Status: DC
  Filled 2023-12-16: qty 90, 90d supply, fill #0

## 2023-12-17 ENCOUNTER — Telehealth (INDEPENDENT_AMBULATORY_CARE_PROVIDER_SITE_OTHER): Payer: Self-pay | Admitting: *Deleted

## 2023-12-17 ENCOUNTER — Other Ambulatory Visit (HOSPITAL_COMMUNITY): Payer: Self-pay

## 2023-12-17 ENCOUNTER — Other Ambulatory Visit: Payer: Self-pay

## 2023-12-17 NOTE — Telephone Encounter (Signed)
 Patient called to get xray results. I let her know still pending. She states she is taking 2 capfuls of miralax  and her last good BM was over 2 weeks ago. Having small amounts but states it is not enough to mention. Having a little nausea. States she has noticed an abnormal odor to her stools.   Colonoscopy scheduled for the 24th.   [phone]

## 2023-12-17 NOTE — Telephone Encounter (Signed)
 Pleas ask her to increase her Miralax to three capfuls per day for now.  Will reassess at time of colonoscopy Thanks

## 2023-12-18 ENCOUNTER — Other Ambulatory Visit (HOSPITAL_COMMUNITY): Payer: Self-pay

## 2023-12-18 NOTE — Telephone Encounter (Signed)
 Discussed with patient per Dr. Levon Hedger -  increase her Miralax to three capfuls per day for now.  Will reassess at time of colonoscopy  Patient verbalized understanding

## 2023-12-23 ENCOUNTER — Ambulatory Visit: Payer: Self-pay | Admitting: Internal Medicine

## 2023-12-23 ENCOUNTER — Telehealth: Payer: 59 | Admitting: Family Medicine

## 2023-12-23 DIAGNOSIS — R21 Rash and other nonspecific skin eruption: Secondary | ICD-10-CM | POA: Diagnosis not present

## 2023-12-23 MED ORDER — TRIAMCINOLONE ACETONIDE 0.025 % EX OINT: 1.0000 | TOPICAL_OINTMENT | Freq: Two times a day (BID) | CUTANEOUS | 0 refills | Status: DC

## 2023-12-23 NOTE — Progress Notes (Signed)
 Virtual Visit Consent   [name], you are scheduled for a virtual visit with a [facility] provider today. Just as with appointments in the office, your consent must be obtained to participate. Your consent will be active for this visit and any virtual visit you may have with one of our providers in the next 365 days. If you have a MyChart account, a copy of this consent can be sent to you electronically.  As this is a virtual visit, video technology does not allow for your provider to perform a traditional examination. This may limit your provider's ability to fully assess your condition. If your provider identifies any concerns that need to be evaluated in person or the need to arrange testing (such as labs, EKG, etc.), we will make arrangements to do so. Although advances in technology are sophisticated, we cannot ensure that it will always work on either your end or our end. If the connection with a video visit is poor, the visit may have to be switched to a telephone visit. With either a video or telephone visit, we are not always able to ensure that we have a secure connection.  By engaging in this virtual visit, you consent to the provision of healthcare and authorize for your insurance to be billed (if applicable) for the services provided during this visit. Depending on your insurance coverage, you may receive a charge related to this service.  I need to obtain your verbal consent now. Are you willing to proceed with your visit today? [name] has provided verbal consent on 12/23/2023 for a virtual visit (video or telephone). [name], NP  Date: 12/23/2023 3:35 PM  Virtual Visit via Video Note   I, Chiquita CHRISTELLA Alvarado, connected with  [name]  ([id number], [birth year]) on 12/23/23 at  3:30 PM EST by a video-enabled telemedicine application and verified that I am speaking with the correct person using two identifiers.  Location: Patient: Virtual Visit Location Patient:  Home Provider: Virtual Visit Location Provider: Home Office   I discussed the limitations of evaluation and management by telemedicine and the availability of in person appointments. The patient expressed understanding and agreed to proceed.    History of Present Illness: [name] is a 58 y.o. who identifies as a female who was assigned female at birth, and is being seen today for rash   Onset was 3 days ago with itching on chest  Associated symptoms are red rash over chest and upper breast, and up to the base of neck, feels warm, and itching  Modifying factors are benadryl  cream (that make it feel worse)  Denies chest pain, shortness of breath, fevers, chills, not peeling, lesions, or scales, or blisters. No systemic symptoms.  Denies changes in soap or hygiene products, no new foods, new medications or supplements.  Completed Shingles vaccine in 2023  No history of this before.    Problems:  Patient Active Problem List   Diagnosis Date Noted   Chronic left flank pain 10/18/2023   B12 deficiency 06/17/2023   Long-term (current) use of injectable non-insulin  antidiabetic drugs 05/30/2023   Epidermoid cyst of skin of scalp 04/15/2023   Cervical spinal stenosis 01/30/2023   Nodule of finger of right hand 01/22/2023   Acute idiopathic gout 01/01/2023   Fecal incontinence 01/01/2023   Vertigo 08/09/2022   Chronic mucoid otitis media of left ear 05/16/2022   Primary insomnia 05/09/2022   ASCUS of cervix with negative high risk HPV  01/16/2022   Spondylolisthesis of lumbar region 12/05/2021   Spinal stenosis of lumbar region with neurogenic claudication 10/25/2021   Chronic bilateral low back pain with left-sided sciatica 10/18/2021   Change in bowel habits 01/18/2021   GERD (gastroesophageal reflux disease) 03/09/2020   Other intervertebral disc degeneration, lumbar region 05/24/2019   OSA (obstructive sleep apnea) 12/08/2014   Depression 07/15/2013   Dyspnea 11/12/2012    Headache 10/14/2012   Neuropathy 10/14/2012   Essential hypertension, benign 05/10/2012   Diabetes mellitus (HCC) 09/29/2008   Mixed hyperlipidemia 09/29/2008   Class 2 severe obesity due to excess calories with serious comorbidity and body mass index (BMI) of 38.0 to 38.9 in adult [facility]) 08/31/2008   Recurrent nephrolithiasis 08/31/2008    Allergies:  Allergies  Allergen Reactions   Ciprofloxacin  Hives and Swelling   Penicillins Anaphylaxis and Rash   Codeine Other (See Comments)    had hallicunations   Morphine  Nausea And Vomiting and Other (See Comments)    recieved in ED due to [facility] and had multple doses - gave visual hallucinations and vomitting.   Sulfonamide Derivatives Other (See Comments)    REACTION: Unsure - childhood allergy   Vancomycin  Itching   Medications:  Current Outpatient Medications:    albuterol  (VENTOLIN  HFA) 108 (90 Base) MCG/ACT inhaler, Inhale 2 puffs into the lungs every 6 (six) hours as needed for wheezing or shortness of breath. , Disp: , Rfl:    blood glucose meter kit and supplies, Use up to four times daily as directed., Disp: 1 each, Rfl: 0   citalopram  (CELEXA ) 10 MG tablet, Take 1 tablet (10 mg total) by mouth daily., Disp: 30 tablet, Rfl: 3   cyclobenzaprine  (FLEXERIL ) 10 MG tablet, Take 1 tablet (10 mg total) by mouth 3 (three) times daily as needed for muscle spasms., Disp: 30 tablet, Rfl: 0   Dulaglutide  (TRULICITY ) 4.5 MG/0.5ML SOAJ, Inject 4.5 mg as directed once a week., Disp: 6 mL, Rfl: 1   Empagliflozin -metFORMIN  HCl (SYNJARDY ) 12.5-500 MG TABS, Take 1 tablet by mouth daily., Disp: 90 tablet, Rfl: 0   furosemide  (LASIX ) 20 MG tablet, Take 20 mg by mouth 2 (two) times daily as needed for edema., Disp: , Rfl:    gabapentin  (NEURONTIN ) 600 MG tablet, Take 1 tablet (600 mg total) by mouth 3 (three) times daily., Disp: 90 tablet, Rfl: 2   glucose blood test strip, Test up to 4 times a day, Disp: 100 each, Rfl: 0   Lancets (FREESTYLE)  lancets, Use to test 4 times daily, Disp: 100 each, Rfl: 0   lisinopril -hydrochlorothiazide  (ZESTORETIC ) 20-12.5 MG tablet, Take 1 tablet by mouth daily., Disp: 90 tablet, Rfl: 3   meclizine  (ANTIVERT ) 25 MG tablet, Take 1 tablet (25 mg total) by mouth 2 (two) times daily as needed for dizziness., Disp: 30 tablet, Rfl: 0   meloxicam  (MOBIC ) 7.5 MG tablet, Take 1 tablet (7.5 mg total) by mouth daily., Disp: 30 tablet, Rfl: 5   omeprazole  (PRILOSEC) 40 MG capsule, Take 1 capsule (40 mg total) by mouth daily., Disp: 90 capsule, Rfl: 1   ondansetron  (ZOFRAN ) 4 MG tablet, Take 1 tablet (4 mg total) by mouth daily as needed for nausea or vomiting., Disp: 30 tablet, Rfl: 1   promethazine  (PHENERGAN ) 25 MG tablet, TAKE 1 TABLET(25 MG) BY MOUTH EVERY 8 HOURS AS NEEDED FOR NAUSEA OR VOMITING, Disp: 20 tablet, Rfl: 0   simvastatin  (ZOCOR ) 40 MG tablet, Take 1 tablet (40 mg total) by mouth every evening for cholesterol,  Disp: 90 tablet, Rfl: 3   zolpidem  (AMBIEN ) 5 MG tablet, Take 1 tablet (5 mg total) by mouth at bedtime as needed for sleep., Disp: 30 tablet, Rfl: 3  Observations/Objective: Patient is well-developed, well-nourished in no acute distress.  Resting comfortably  at home.  Head is normocephalic, atraumatic.  No labored breathing.  Speech is clear and coherent with logical content.  Patient is alert and oriented at baseline.   Difficult to view- area on chest is red.  Assessment and Plan:   1. Rash of unknown etiology (Primary)  - triamcinolone  (KENALOG ) 0.025 % ointment; Apply 1 Application topically 2 (two) times daily.  Dispense: 30 g; Refill: 0  -rash is hard to view- due to itching - has has been rubbing it, causing a more flatten and larger area of rash to appear on camera -will try low dose topical cream and had strict follow up for changes -no red flags- this does not appear at this time to be shingles, but she reports it is sensitivity. Has completed her Shingrix  series  2023  -avoid rubbing -take zytrec or oral antihistamine for itching  -follow up if spreads, blisters, fever, or changes are noted   Reviewed side effects, risks and benefits of medication.    Patient acknowledged agreement and understanding of the plan.   Past Medical, Surgical, Social History, Allergies, and Medications have been Reviewed.    Follow Up Instructions: I discussed the assessment and treatment plan with the patient. The patient was provided an opportunity to ask questions and all were answered. The patient agreed with the plan and demonstrated an understanding of the instructions.  A copy of instructions were sent to the patient via MyChart unless otherwise noted below.     The patient was advised to call back or seek an in-person evaluation if the symptoms worsen or if the condition fails to improve as anticipated.    [name], NP

## 2023-12-23 NOTE — Patient Instructions (Addendum)
 [name], thank you for joining [name], NP for today's virtual visit.  While this provider is not your primary care provider (PCP), if your PCP is located in our provider database this encounter information will be shared with them immediately following your visit.   A [facility] MyChart account gives you access to today's visit and all your visits, tests, and labs performed at [facility]  click here if you don't have a [facility] MyChart account or go to [email or web address]  Consent: (Patient) [name] provided verbal consent for this virtual visit at the beginning of the encounter.  Current Medications:  Current Outpatient Medications:    triamcinolone  (KENALOG ) 0.025 % ointment, Apply 1 Application topically 2 (two) times daily., Disp: 30 g, Rfl: 0   albuterol  (VENTOLIN  HFA) 108 (90 Base) MCG/ACT inhaler, Inhale 2 puffs into the lungs every 6 (six) hours as needed for wheezing or shortness of breath. , Disp: , Rfl:    blood glucose meter kit and supplies, Use up to four times daily as directed., Disp: 1 each, Rfl: 0   citalopram  (CELEXA ) 10 MG tablet, Take 1 tablet (10 mg total) by mouth daily., Disp: 30 tablet, Rfl: 3   cyclobenzaprine  (FLEXERIL ) 10 MG tablet, Take 1 tablet (10 mg total) by mouth 3 (three) times daily as needed for muscle spasms., Disp: 30 tablet, Rfl: 0   Dulaglutide  (TRULICITY ) 4.5 MG/0.5ML SOAJ, Inject 4.5 mg as directed once a week., Disp: 6 mL, Rfl: 1   Empagliflozin -metFORMIN  HCl (SYNJARDY ) 12.5-500 MG TABS, Take 1 tablet by mouth daily., Disp: 90 tablet, Rfl: 0   furosemide  (LASIX ) 20 MG tablet, Take 20 mg by mouth 2 (two) times daily as needed for edema., Disp: , Rfl:    gabapentin  (NEURONTIN ) 600 MG tablet, Take 1 tablet (600 mg total) by mouth 3 (three) times daily., Disp: 90 tablet, Rfl: 2   glucose blood test strip, Test up to 4 times a day, Disp: 100 each, Rfl: 0   Lancets (FREESTYLE) lancets, Use to test 4 times daily,  Disp: 100 each, Rfl: 0   lisinopril -hydrochlorothiazide  (ZESTORETIC ) 20-12.5 MG tablet, Take 1 tablet by mouth daily., Disp: 90 tablet, Rfl: 3   meclizine  (ANTIVERT ) 25 MG tablet, Take 1 tablet (25 mg total) by mouth 2 (two) times daily as needed for dizziness., Disp: 30 tablet, Rfl: 0   meloxicam  (MOBIC ) 7.5 MG tablet, Take 1 tablet (7.5 mg total) by mouth daily., Disp: 30 tablet, Rfl: 5   omeprazole  (PRILOSEC) 40 MG capsule, Take 1 capsule (40 mg total) by mouth daily., Disp: 90 capsule, Rfl: 1   ondansetron  (ZOFRAN ) 4 MG tablet, Take 1 tablet (4 mg total) by mouth daily as needed for nausea or vomiting., Disp: 30 tablet, Rfl: 1   promethazine  (PHENERGAN ) 25 MG tablet, TAKE 1 TABLET(25 MG) BY MOUTH EVERY 8 HOURS AS NEEDED FOR NAUSEA OR VOMITING, Disp: 20 tablet, Rfl: 0   simvastatin  (ZOCOR ) 40 MG tablet, Take 1 tablet (40 mg total) by mouth every evening for cholesterol, Disp: 90 tablet, Rfl: 3   zolpidem  (AMBIEN ) 5 MG tablet, Take 1 tablet (5 mg total) by mouth at bedtime as needed for sleep., Disp: 30 tablet, Rfl: 3   Medications ordered in this encounter:  Meds ordered this encounter  Medications   triamcinolone  (KENALOG ) 0.025 % ointment    Sig: Apply 1 Application topically 2 (two) times daily.    Dispense:  30 g    Refill:  0  Supervising Provider:   BLAISE ALEENE KIDD [[id number]]     *If you need refills on other medications prior to your next appointment, please contact your pharmacy*  Follow-Up: Call back or seek an in-person evaluation if the symptoms worsen or if the condition fails to improve as anticipated.  [facility] [phone]  Other Instructions -avoid rubbing -take zytrec or oral antihistamine for itching  -follow up if spreads, blisters, fever, or changes are noted   If you have been instructed to have an in-person evaluation today at a local Urgent Care facility, please use the link below. It will take you to a list of all of our available [facility], including address, phone number and hours of operation. Please do not delay care.  [facility]  If you or a family member do not have a primary care provider, use the link below to schedule a visit and establish care. When you choose a [facility] primary care physician or advanced practice provider, you gain a long-term partner in health. Find a Primary Care Provider  Learn more about [facility]'s in-office and virtual care options:  - Get Care Now

## 2023-12-23 NOTE — Telephone Encounter (Signed)
 Please advice if pt needs an in person or video visit or something can be sent in?

## 2023-12-23 NOTE — Telephone Encounter (Signed)
  Chief Complaint: Rash Symptoms: small red spots to body, chest, above breasts Frequency: Since Friday Jan 10th Pertinent Negatives: Patient denies fever, SOB, CP Disposition: [] ED /[] Urgent Care (no appt availability in office) / [] Appointment(In office/virtual)/ [x]  [facility]/ [] Home Care/ [] Refused Recommended Disposition /[] [facility]/ []  Follow-up with PCP Additional Notes: patient c/o generalized small red rash to body, chest, over breasts. Patient endorses severe itching along with burning sensation to skin. Denies SOB, CP and fever. Patient states no changes to soap, laundry detergent or no new medications. Per protocol, patient to be seen in 24 hours. Patient requests virtual appointment as there is no availability with PCP. Appointment made with Virtual Urgent Care for 3:30 pm 12/23/2023 as patient isn't available until after three today. Patient verbalizes understanding of plan and all questions answered.   Copied from CRM [id number]. Topic: Appointments - Red Word Triage >> Dec 23, 2023  9:55 AM Powell HERO wrote: Rash on body, chest, below chin, above boobs. Reason for Disposition  SEVERE itching (i.e., interferes with sleep, normal activities or school)  Answer Assessment - Initial Assessment Questions 1. APPEARANCE of RASH: Describe the rash. (e.g., spots, blisters, raised areas, skin peeling, scaly)     Red blister looking hives 2. SIZE: How big are the spots? (e.g., tip of pen, eraser, coin; inches, centimeters)     Tip of pen 3. LOCATION: Where is the rash located?     Body, chest, below chin and above breasts 4. COLOR: What color is the rash? (Note: It is difficult to assess rash color in people with darker-colored skin. When this situation occurs, simply ask the caller to describe what they see.)     Red 5. ONSET: When did the rash begin?     Started late Friday  6. FEVER: Do you have a fever? If Yes, ask: What is your temperature, how  was it measured, and when did it start?     No 7. ITCHING: Does the rash itch? If Yes, ask: How bad is the itch? (Scale 1-10; or mild, moderate, severe)     Severely itching 8. CAUSE: What do you think is causing the rash?     Unsure-no changes in soap or laundry detergent.  9. MEDICINE FACTORS: Have you started any new medicines within the last 2 weeks? (e.g., antibiotics)      No new medicines 10. OTHER SYMPTOMS: Do you have any other symptoms? (e.g., dizziness, headache, sore throat, joint pain)       No  Protocols used: Rash or Redness - [facility]

## 2023-12-27 ENCOUNTER — Ambulatory Visit (INDEPENDENT_AMBULATORY_CARE_PROVIDER_SITE_OTHER): Payer: 59 | Admitting: Family Medicine

## 2023-12-27 VITALS — BP 107/62 | HR 85 | Ht <= 58 in | Wt 167.1 lb

## 2023-12-27 DIAGNOSIS — L259 Unspecified contact dermatitis, unspecified cause: Secondary | ICD-10-CM

## 2023-12-27 DIAGNOSIS — L089 Local infection of the skin and subcutaneous tissue, unspecified: Secondary | ICD-10-CM | POA: Diagnosis not present

## 2023-12-27 MED ORDER — DOXYCYCLINE HYCLATE 100 MG PO TABS: 100.0000 mg | ORAL_TABLET | Freq: Two times a day (BID) | ORAL | 0 refills | Status: AC

## 2023-12-27 MED ORDER — METHYLPREDNISOLONE ACETATE 80 MG/ML IJ SUSP
40.0000 mg | Freq: Once | INTRAMUSCULAR | Status: AC
  Administered 2023-12-27: 40 mg via INTRAMUSCULAR

## 2023-12-27 MED ORDER — FLUCONAZOLE 150 MG PO TABS: 150.0000 mg | ORAL_TABLET | Freq: Once | ORAL | 0 refills | Status: AC

## 2023-12-27 MED ORDER — METHYLPREDNISOLONE ACETATE 40 MG/ML IJ SUSP: 40.0000 mg | Freq: Once | INTRAMUSCULAR | Status: DC

## 2023-12-27 NOTE — Progress Notes (Signed)
Established Patient Office Visit   Subjective  Patient ID: [name], female    DOB: 1966/03/22  Age: 58 y.o. MRN: [id number]  Chief Complaint  Patient presents with   Acute Visit    Red skin, itching, painful, bruise with knots across chest and breast area x 2 weeks. .     She  has a past medical history of Diabetes mellitus, Dysphagia (03/09/2020), Epidural hematoma (HCC) (12/14/2021), GERD (gastroesophageal reflux disease), History of kidney stones, Hypercholesteremia, Hypertension, Incarcerated umbilical hernia (03/25/2014), Kidney stone, Kidney stones (08/31/2008), Neuropathy, Obesity, Palpitations, Shortness of breath, and Vertigo.  Patient presents with a rash that has been present for 2 weeks, gradually worsening since onset. The rash is located on the chest and is characterized by redness, itchiness, and bruising. The patient reports a burning sensation on the skin, with pain rated at 7/10, and the area is warm and tender to touch. It is unclear if there was exposure to a precipitating factor. Pertinent negatives include no congestion, fatigue, or fever. The patient has tried triamcinolone ointment mild relief. There is no personal history of eczema.    Review of Systems  Constitutional:  Negative for chills and fever.  Respiratory:  Negative for shortness of breath.   Cardiovascular:  Negative for chest pain.  Skin:  Positive for itching and rash.  Neurological:  Negative for dizziness and headaches.      Objective:     BP 107/62   Pulse 85   Ht 4\' 9"  (1.448 m)   Wt 167 lb 1.9 oz (75.8 kg)   LMP 07/16/2012   SpO2 95%   BMI 36.16 kg/m  BP Readings from Last 3 Encounters:  12/27/23 107/62  12/12/23 110/70  11/28/23 110/72      Physical Exam Vitals reviewed.  Constitutional:      General: She is not in acute distress.    Appearance: Normal appearance. She is not ill-appearing, toxic-appearing or diaphoretic.  HENT:     Head: Normocephalic.  Eyes:      General:        Right eye: No discharge.        Left eye: No discharge.     Conjunctiva/sclera: Conjunctivae normal.  Cardiovascular:     Rate and Rhythm: Normal rate.     Pulses: Normal pulses.     Heart sounds: Normal heart sounds.  Pulmonary:     Effort: Pulmonary effort is normal. No respiratory distress.     Breath sounds: Normal breath sounds.  Skin:    Capillary Refill: Capillary refill takes less than 2 seconds.     Findings: Erythema and rash present.     Comments: Erythematous flat papules are present across the chest, warm and tender to touch, with no drainage or pus observed  Neurological:     Mental Status: She is alert.  Psychiatric:        Mood and Affect: Mood normal.        Behavior: Behavior normal.      No results found for any visits on 12/27/23.  The 10-year ASCVD risk score (Arnett DK, et al., 2019) is: 3.5%    Assessment & Plan:  Contact dermatitis, unspecified contact dermatitis type, unspecified trigger -     Doxycycline Hyclate; Take 1 tablet (100 mg total) by mouth 2 (two) times daily for 7 days.  Dispense: 14 tablet; Refill: 0 -     methylPREDNISolone Acetate -     methylPREDNISolone Acetate  Skin infection Assessment &  Plan: Depo-medrol 40 mg IM injection given Start doxycyline 100 mg twice daily x 7 days Advise patient to apply cold , wet cloth or ice pack to the skin that itches, wear loose-fitting , cotton clothing, use fragrance-free lotions, soaps, and detergents to minimize irritation.    Other orders -     Fluconazole; Take 1 tablet (150 mg total) by mouth once for 1 dose.  Dispense: 1 tablet; Refill: 0    Return if symptoms worsen or fail to improve.   Cruzita Lederer Newman Nip, FNP

## 2023-12-27 NOTE — Patient Instructions (Addendum)
        Great to see you today.  I have refilled the medication(s) we provide.    - Please take medications as prescribed. - Follow up with your primary health provider if any health concerns arises. - If symptoms worsen please contact your primary care provider and/or visit the emergency department.  

## 2023-12-27 NOTE — Assessment & Plan Note (Signed)
Depo-medrol 40 mg IM injection given Start doxycyline 100 mg twice daily x 7 days Advise patient to apply cold , wet cloth or ice pack to the skin that itches, wear loose-fitting , cotton clothing, use fragrance-free lotions, soaps, and detergents to minimize irritation.

## 2023-12-31 ENCOUNTER — Ambulatory Visit: Payer: 59 | Admitting: Student in an Organized Health Care Education/Training Program

## 2024-01-01 ENCOUNTER — Encounter (HOSPITAL_COMMUNITY)
Admission: RE | Admit: 2024-01-01 | Discharge: 2024-01-01 | Disposition: A | Discharge status: Home / Self Care | Payer: 59 | Source: Ambulatory Visit | Attending: Gastroenterology | Admitting: Gastroenterology

## 2024-01-01 ENCOUNTER — Encounter (HOSPITAL_COMMUNITY): Payer: Self-pay

## 2024-01-01 ENCOUNTER — Other Ambulatory Visit: Payer: Self-pay

## 2024-01-01 ENCOUNTER — Other Ambulatory Visit (HOSPITAL_COMMUNITY): Payer: Self-pay

## 2024-01-03 ENCOUNTER — Encounter (HOSPITAL_COMMUNITY)
Admission: RE | Disposition: A | Discharge status: Home / Self Care | Payer: Self-pay | Source: Home / Self Care | Attending: Gastroenterology

## 2024-01-03 ENCOUNTER — Other Ambulatory Visit: Payer: Self-pay

## 2024-01-03 ENCOUNTER — Ambulatory Visit (HOSPITAL_BASED_OUTPATIENT_CLINIC_OR_DEPARTMENT_OTHER): Payer: 59 | Admitting: Certified Registered"

## 2024-01-03 ENCOUNTER — Ambulatory Visit (HOSPITAL_COMMUNITY): Payer: 59 | Admitting: Certified Registered"

## 2024-01-03 ENCOUNTER — Ambulatory Visit (HOSPITAL_COMMUNITY)
Admission: RE | Admit: 2024-01-03 | Discharge: 2024-01-03 | Disposition: A | Discharge status: Home / Self Care | Payer: 59 | Attending: Gastroenterology | Admitting: Gastroenterology

## 2024-01-03 ENCOUNTER — Encounter (HOSPITAL_COMMUNITY): Payer: Self-pay | Admitting: Gastroenterology

## 2024-01-03 DIAGNOSIS — Z1211 Encounter for screening for malignant neoplasm of colon: Secondary | ICD-10-CM

## 2024-01-03 DIAGNOSIS — E119 Type 2 diabetes mellitus without complications: Secondary | ICD-10-CM | POA: Diagnosis not present

## 2024-01-03 DIAGNOSIS — I10 Essential (primary) hypertension: Secondary | ICD-10-CM | POA: Insufficient documentation

## 2024-01-03 DIAGNOSIS — Z7984 Long term (current) use of oral hypoglycemic drugs: Secondary | ICD-10-CM | POA: Insufficient documentation

## 2024-01-03 DIAGNOSIS — K649 Unspecified hemorrhoids: Secondary | ICD-10-CM

## 2024-01-03 DIAGNOSIS — G4730 Sleep apnea, unspecified: Secondary | ICD-10-CM | POA: Diagnosis not present

## 2024-01-03 DIAGNOSIS — K219 Gastro-esophageal reflux disease without esophagitis: Secondary | ICD-10-CM | POA: Diagnosis not present

## 2024-01-03 DIAGNOSIS — K648 Other hemorrhoids: Secondary | ICD-10-CM | POA: Insufficient documentation

## 2024-01-03 DIAGNOSIS — E66813 Obesity, class 3: Secondary | ICD-10-CM | POA: Diagnosis not present

## 2024-01-03 DIAGNOSIS — G4733 Obstructive sleep apnea (adult) (pediatric): Secondary | ICD-10-CM

## 2024-01-03 DIAGNOSIS — Z7985 Long-term (current) use of injectable non-insulin antidiabetic drugs: Secondary | ICD-10-CM | POA: Insufficient documentation

## 2024-01-03 DIAGNOSIS — Z6836 Body mass index (BMI) 36.0-36.9, adult: Secondary | ICD-10-CM | POA: Insufficient documentation

## 2024-01-03 HISTORY — PX: COLONOSCOPY WITH PROPOFOL: SHX5780

## 2024-01-03 LAB — GLUCOSE, CAPILLARY: Glucose-Capillary: 90 mg/dL (ref 70–99)

## 2024-01-03 LAB — HM COLONOSCOPY

## 2024-01-03 SURGERY — COLONOSCOPY WITH PROPOFOL
Anesthesia: General

## 2024-01-03 MED ORDER — PHENYLEPHRINE 80 MCG/ML (10ML) SYRINGE FOR IV PUSH (FOR BLOOD PRESSURE SUPPORT)
PREFILLED_SYRINGE | INTRAVENOUS | Status: AC
  Filled 2024-01-03: qty 10

## 2024-01-03 MED ORDER — PROPOFOL 500 MG/50ML IV EMUL
INTRAVENOUS | Status: DC | PRN
Start: 2024-01-03 — End: 2024-01-03
  Administered 2024-01-03: 150 ug/kg/min via INTRAVENOUS

## 2024-01-03 MED ORDER — DEXMEDETOMIDINE HCL IN NACL 80 MCG/20ML IV SOLN
INTRAVENOUS | Status: DC | PRN
  Administered 2024-01-03: 8 ug via INTRAVENOUS
  Administered 2024-01-03: 4 ug via INTRAVENOUS
  Administered 2024-01-03: 8 ug via INTRAVENOUS

## 2024-01-03 MED ORDER — LINACLOTIDE 72 MCG PO CAPS: 72.0000 ug | ORAL_CAPSULE | Freq: Every day | ORAL | 1 refills | Status: DC

## 2024-01-03 MED ORDER — PROPOFOL 10 MG/ML IV BOLUS
INTRAVENOUS | Status: DC | PRN
  Administered 2024-01-03: 100 mg via INTRAVENOUS

## 2024-01-03 MED ORDER — PHENYLEPHRINE 80 MCG/ML (10ML) SYRINGE FOR IV PUSH (FOR BLOOD PRESSURE SUPPORT)
PREFILLED_SYRINGE | INTRAVENOUS | Status: DC | PRN
  Administered 2024-01-03 (×5): 160 ug via INTRAVENOUS

## 2024-01-03 MED ORDER — LIDOCAINE HCL (PF) 2 % IJ SOLN
INTRAMUSCULAR | Status: DC | PRN
  Administered 2024-01-03: 50 mg via INTRADERMAL

## 2024-01-03 MED ORDER — LACTATED RINGERS IV SOLN: INTRAVENOUS | Status: DC | PRN

## 2024-01-03 NOTE — Op Note (Signed)
[facility] Patient Name: [name] Procedure Date: 01/03/2024 12:11 PM MRN: [id number] Date of Birth: [birth year] Attending MD: Katrinka Blazing , , [id number] CSN: [id number] Age: 58 Admit Type: Outpatient Procedure:                Colonoscopy Indications:              Screening for colorectal malignant neoplasm Providers:                Katrinka Blazing, Sheran Fava Referring MD:              Medicines:                Monitored Anesthesia Care Complications:            No immediate complications. Estimated Blood Loss:     Estimated blood loss: none. Procedure:                Pre-Anesthesia Assessment:                           - Prior to the procedure, a History and Physical                            was performed, and patient medications, allergies                            and sensitivities were reviewed. The patient's                            tolerance of previous anesthesia was reviewed.                           - The risks and benefits of the procedure and the                            sedation options and risks were discussed with the                            patient. All questions were answered and informed                            consent was obtained.                           - ASA Grade Assessment: II - A patient with mild                            systemic disease.                           After obtaining informed consent, the colonoscope                            was passed under direct vision. Throughout the                            procedure, the patient's blood pressure, pulse, and  oxygen saturations were monitored continuously. The                            [id number]-HQ190L ([id number]) scope was introduced through                            the anus and advanced to the the cecum, identified                            by appendiceal orifice and ileocecal valve. The                            colonoscopy was performed  without difficulty. The                            patient tolerated the procedure well. The quality                            of the bowel preparation was adequate. Scope In: 1:44:15 PM Scope Out: 2:04:18 PM Scope Withdrawal Time: 0 hours 14 minutes 40 seconds  Total Procedure Duration: 0 hours 20 minutes 3 seconds  Findings:      The perianal and digital rectal examinations were normal.      The entire examined colon appeared normal.      Non-bleeding internal hemorrhoids were found during retroflexion. The       hemorrhoids were small. Impression:               - The entire examined colon is normal.                           - Non-bleeding internal hemorrhoids.                           - No specimens collected. Moderate Sedation:      Per Anesthesia Care Recommendation:           - Discharge patient to home (ambulatory).                           - Resume previous diet.                           - Repeat colonoscopy in 10 years for screening                            purposes. Procedure Code(s):        --- Professional ---                           Z3086, Colorectal cancer screening; colonoscopy on                            individual not meeting criteria for high risk Diagnosis Code(s):        --- Professional ---  Z12.11, Encounter for screening for malignant                            neoplasm of colon                           K64.8, Other hemorrhoids CPT copyright 2022 American Medical Association. All rights reserved. The codes documented in this report are preliminary and upon coder review may  be revised to meet current compliance requirements. Katrinka Blazing, MD Katrinka Blazing,  01/03/2024 2:17:22 PM This report has been signed electronically. Number of Addenda: 0

## 2024-01-03 NOTE — Interval H&P Note (Signed)
History and Physical Interval Note:  01/03/2024 12:19 PM  [name]  has presented today for surgery, with the diagnosis of screening.  The various methods of treatment have been discussed with the patient and family. After consideration of risks, benefits and other options for treatment, the patient has consented to  Procedure(s) with comments: COLONOSCOPY WITH PROPOFOL (N/A) - 2:00pm;asa 1 as a surgical intervention.  The patient's history has been reviewed, patient examined, no change in status, stable for surgery.  I have reviewed the patient's chart and labs.  Questions were answered to the patient's satisfaction.     [name]

## 2024-01-03 NOTE — Anesthesia Procedure Notes (Addendum)
Date/Time: 01/03/2024 1:39 PM  Performed by: Julian Reil, CRNAPre-anesthesia Checklist: Emergency Drugs available, Patient identified, Suction available and Patient being monitored Patient Re-evaluated:Patient Re-evaluated prior to induction Oxygen Delivery Method: Nasal cannula Induction Type: IV induction Placement Confirmation: positive ETCO2 Comments: Optiflow High Flow [place] O2 used.

## 2024-01-03 NOTE — Discharge Instructions (Signed)
You are being discharged to home.  Resume your previous diet.  Your physician has recommended a repeat colonoscopy in 10 years for screening purposes.

## 2024-01-03 NOTE — Anesthesia Preprocedure Evaluation (Signed)
Anesthesia Evaluation  Patient identified by MRN, date of birth, ID band Patient awake    Reviewed: Allergy & Precautions, H&P , NPO status , Patient's Chart, lab work & pertinent test results, reviewed documented beta blocker date and time   Airway Mallampati: II  TM Distance: >3 FB Neck ROM: full    Dental no notable dental hx.    Pulmonary neg pulmonary ROS, shortness of breath, sleep apnea    Pulmonary exam normal breath sounds clear to auscultation       Cardiovascular Exercise Tolerance: Good hypertension, negative cardio ROS  Rhythm:regular Rate:Normal     Neuro/Psych  Headaches PSYCHIATRIC DISORDERS  Depression     Neuromuscular disease negative neurological ROS  negative psych ROS   GI/Hepatic negative GI ROS, Neg liver ROS,GERD  ,,  Endo/Other  diabetes  Class 3 obesity  Renal/GU Renal diseasenegative Renal ROS  negative genitourinary   Musculoskeletal   Abdominal   Peds  Hematology negative hematology ROS (+)   Anesthesia Other Findings   Reproductive/Obstetrics negative OB ROS                             Anesthesia Physical Anesthesia Plan  ASA: 3  Anesthesia Plan: General   Post-op Pain Management:    Induction:   PONV Risk Score and Plan: Propofol infusion  Airway Management Planned:   Additional Equipment:   Intra-op Plan:   Post-operative Plan:   Informed Consent: I have reviewed the patients History and Physical, chart, labs and discussed the procedure including the risks, benefits and alternatives for the proposed anesthesia with the patient or authorized representative who has indicated his/her understanding and acceptance.     Dental Advisory Given  Plan Discussed with: CRNA  Anesthesia Plan Comments:        Anesthesia Quick Evaluation

## 2024-01-03 NOTE — Transfer of Care (Signed)
Immediate Anesthesia Transfer of Care Note  Patient: [name]  Procedure(s) Performed: COLONOSCOPY WITH PROPOFOL  Patient Location: Short Stay  Anesthesia Type:General  Level of Consciousness: drowsy  Airway & Oxygen Therapy: Patient Spontanous Breathing  Post-op Assessment: Report given to RN and Post -op Vital signs reviewed and stable  Post vital signs: Reviewed and stable  Last Vitals:  Vitals Value Taken Time  BP    Temp    Pulse    Resp    SpO2      Last Pain:  Vitals:   01/03/24 1340  TempSrc:   PainSc: 5          Complications: No notable events documented.

## 2024-01-06 ENCOUNTER — Encounter (HOSPITAL_COMMUNITY): Payer: Self-pay | Admitting: Gastroenterology

## 2024-01-06 ENCOUNTER — Telehealth (INDEPENDENT_AMBULATORY_CARE_PROVIDER_SITE_OTHER): Payer: Self-pay

## 2024-01-06 ENCOUNTER — Other Ambulatory Visit (INDEPENDENT_AMBULATORY_CARE_PROVIDER_SITE_OTHER): Payer: Self-pay | Admitting: Gastroenterology

## 2024-01-06 ENCOUNTER — Other Ambulatory Visit (HOSPITAL_COMMUNITY): Payer: Self-pay

## 2024-01-06 ENCOUNTER — Other Ambulatory Visit (INDEPENDENT_AMBULATORY_CARE_PROVIDER_SITE_OTHER): Payer: Self-pay

## 2024-01-06 DIAGNOSIS — K5904 Chronic idiopathic constipation: Secondary | ICD-10-CM

## 2024-01-06 MED ORDER — LINACLOTIDE 72 MCG PO CAPS
72.0000 ug | ORAL_CAPSULE | Freq: Every day | ORAL | 1 refills | Status: DC
  Filled 2024-01-06 (×2): qty 30, 30d supply, fill #0

## 2024-01-06 MED ORDER — LUBIPROSTONE 8 MCG PO CAPS: 8.0000 ug | ORAL_CAPSULE | Freq: Two times a day (BID) | ORAL | 3 refills | Status: DC

## 2024-01-06 NOTE — Telephone Encounter (Signed)
I spoke with the patient and asked if the cost was because she was not using the preferred pharmacy, of [facility]. She asked that I submit to [facility] and put on the script to not bill her until they call her with the price. Patient was going to call [facility] and make them aware not to bill her until she knows the cost. I have submitted the script to [facility] with note to not bill until she ok's.   Linzess 72 mcg cost too much with the insurance and with a manufacturers co pay card still cost over $300.00.

## 2024-01-06 NOTE — Telephone Encounter (Signed)
Please see below. Patient insurance will not cover her Linzess, with the co pay card it is still over $300.00. Please advise, she wanted me to check to see If amitza would be something she could use, and be cheap enough for her to afford. Please advise.

## 2024-01-06 NOTE — Anesthesia Postprocedure Evaluation (Signed)
Anesthesia Post Note  Patient: [name]  Procedure(s) Performed: COLONOSCOPY WITH PROPOFOL  Patient location during evaluation: Phase II Anesthesia Type: General Level of consciousness: awake Pain management: pain level controlled Vital Signs Assessment: post-procedure vital signs reviewed and stable Respiratory status: spontaneous breathing and respiratory function stable Cardiovascular status: blood pressure returned to baseline and stable Postop Assessment: no headache and no apparent nausea or vomiting Anesthetic complications: no Comments: Late entry   No notable events documented.   Last Vitals:  Vitals:   01/03/24 1408 01/03/24 1423  BP: (!) 78/50 (!) 82/45  Pulse: 62   Resp: 20   Temp: (!) 36.1 C   SpO2: 100%     Last Pain:  Vitals:   01/03/24 1408  TempSrc: Oral  PainSc: 0-No pain                 Windell Norfolk

## 2024-01-06 NOTE — Telephone Encounter (Signed)
I sent Amitiza 8 mcg twice a day to her pharmacy.

## 2024-01-07 ENCOUNTER — Encounter (INDEPENDENT_AMBULATORY_CARE_PROVIDER_SITE_OTHER): Payer: Self-pay

## 2024-01-07 NOTE — Telephone Encounter (Signed)
Patient says she is aware and will pick up the Amitiza from the pharmacy.

## 2024-01-08 ENCOUNTER — Encounter (INDEPENDENT_AMBULATORY_CARE_PROVIDER_SITE_OTHER): Payer: Self-pay | Admitting: *Deleted

## 2024-01-13 ENCOUNTER — Ambulatory Visit (INDEPENDENT_AMBULATORY_CARE_PROVIDER_SITE_OTHER): Payer: 59 | Admitting: Internal Medicine

## 2024-01-13 ENCOUNTER — Encounter: Payer: Self-pay | Admitting: Internal Medicine

## 2024-01-13 VITALS — BP 121/85 | HR 78 | Ht <= 58 in | Wt 170.2 lb

## 2024-01-13 DIAGNOSIS — R21 Rash and other nonspecific skin eruption: Secondary | ICD-10-CM | POA: Insufficient documentation

## 2024-01-13 MED ORDER — HYDROXYZINE PAMOATE 25 MG PO CAPS: 25.0000 mg | ORAL_CAPSULE | Freq: Three times a day (TID) | ORAL | 0 refills | Status: DC | PRN

## 2024-01-13 MED ORDER — FLUOCINONIDE EMULSIFIED BASE 0.05 % EX CREA: 1.0000 | TOPICAL_CREAM | Freq: Two times a day (BID) | CUTANEOUS | 1 refills | Status: DC

## 2024-01-13 NOTE — Assessment & Plan Note (Signed)
Return to care today for an acute visit for persistent erythematous rash along the chest wall.  There is a 2 x 2 cm erythematous, flat rash present on the right chest wall and a larger erythematous, nonvesicular, nonpustular lesion on the left chest wall.  Previously treated with doxycycline and IM Depo-Medrol.  She has also applied Benadryl cream.  No recent changes to soaps, laundry detergents, diet, or jewelry.  The appearance of the rash remains consistent with dermatitis. -Treatment options reviewed.  Fluocinonide-emollient 0.05% cream for was prescribed today for twice daily application.  She will return to care if the rash does not resolve within 2 weeks at which time it would be reasonable to consider referral to dermatology.  Hydroxyzine added for as needed itch relief as well.  She is scheduled for routine follow-up in March (3/11).

## 2024-01-13 NOTE — Progress Notes (Signed)
   Acute Office Visit  Subjective:     Patient ID: [name], female    DOB: Jul 04, 1966, 58 y.o.   MRN: [id number]  Chief Complaint  Patient presents with   itching    Itching, burning along chest , seen NP last week , given medication is not helping   [name] returns to care today for an acute visit in the setting of persistent rash along her chest.  She was previously evaluated at [facility] on 1/17 endorsing a 1 week history of rash across her chest.  Treated with Depo-Medrol IM injection and doxycycline x 7 days for suspected contact dermatitis.  She was instructed to return to care if symptoms worsen or fail to improve.  Today she states that the rash somewhat improved, but more recently has returned.  She describes itching across her chest.  Symptoms are most noticeable at night.  She is currently using Benadryl cream and taking oral Benadryl as needed for symptom relief.  Review of Systems  Skin:  Positive for rash.      Objective:    BP 121/85 (BP Location: Left Arm, Patient Position: Sitting, Cuff Size: Normal)   Pulse 78   Ht 4\' 9"  (1.448 m)   Wt 170 lb 3.2 oz (77.2 kg)   LMP 07/16/2012   SpO2 96%   BMI 36.83 kg/m   Physical Exam Skin:    Findings: Rash (Well-demarcated, erythematous rash with a 2 x 2 cm lesion in the right chest wall and a larger, flat, erythematous lesion present on left chest wall terminating distally at the left breast.  No pustules or vesicular lesions.  Evidence of excoriation.) present.       Assessment & Plan:   Problem List Items Addressed This Visit       Rash and nonspecific skin eruption - Primary   Return to care today for an acute visit for persistent erythematous rash along the chest wall.  There is a 2 x 2 cm erythematous, flat rash present on the right chest wall and a larger erythematous, nonvesicular, nonpustular lesion on the left chest wall.  Previously treated with doxycycline and IM Depo-Medrol.  She has also applied Benadryl  cream.  No recent changes to soaps, laundry detergents, diet, or jewelry.  The appearance of the rash remains consistent with dermatitis. -Treatment options reviewed.  Fluocinonide-emollient 0.05% cream for was prescribed today for twice daily application.  She will return to care if the rash does not resolve within 2 weeks at which time it would be reasonable to consider referral to dermatology.  Hydroxyzine added for as needed itch relief as well.  She is scheduled for routine follow-up in March (3/11).       Meds ordered this encounter  Medications   fluocinonide-emollient (LIDEX-E) 0.05 % cream    Sig: Apply 1 Application topically 2 (two) times daily.    Dispense:  15 g    Refill:  1   hydrOXYzine (VISTARIL) 25 MG capsule    Sig: Take 1 capsule (25 mg total) by mouth every 8 (eight) hours as needed for itching.    Dispense:  30 capsule    Refill:  0    Return if symptoms worsen or fail to improve.  Billie Lade, MD

## 2024-01-13 NOTE — Telephone Encounter (Signed)
Copied from CRM [id number]. Topic: Appointments - Scheduling Inquiry for Clinic >> Jan 13, 2024 11:30 AM [name] wrote: Reason for CRM: Patient ask to be on cancellation list today if anyone cancel would like to come today instead of tomorrow she do have an appointment tomorrow Tuesday  ,

## 2024-01-13 NOTE — Patient Instructions (Signed)
It was a pleasure to see you today.  Thank you for giving [place] the opportunity to be involved in your care.  Below is a brief recap of your visit and next steps.  We will plan to see you again in March.  Summary Fluocinonide cream prescribed today. Apply twice daily Hydroxyzine added for as needed itch relief Follow up in March as scheduled but return to care if needed before then

## 2024-01-14 ENCOUNTER — Ambulatory Visit: Payer: 59 | Admitting: Internal Medicine

## 2024-01-16 ENCOUNTER — Telehealth: Payer: Self-pay

## 2024-01-16 ENCOUNTER — Ambulatory Visit: Payer: 59 | Attending: Urology

## 2024-01-16 NOTE — Telephone Encounter (Signed)
 Called and left message after no-show for evaluation. She was informed in message that her 3 follow-up visits were cancelled at this time per out policy. She was encouraged to call back to get her evaluation re-scheduled.

## 2024-01-20 ENCOUNTER — Other Ambulatory Visit (HOSPITAL_COMMUNITY): Payer: Self-pay | Admitting: Adult Health

## 2024-01-20 DIAGNOSIS — N63 Unspecified lump in unspecified breast: Secondary | ICD-10-CM

## 2024-01-24 ENCOUNTER — Other Ambulatory Visit: Payer: Self-pay | Admitting: Internal Medicine

## 2024-01-24 ENCOUNTER — Other Ambulatory Visit (HOSPITAL_BASED_OUTPATIENT_CLINIC_OR_DEPARTMENT_OTHER): Payer: Self-pay

## 2024-01-24 ENCOUNTER — Other Ambulatory Visit: Payer: Self-pay

## 2024-01-24 ENCOUNTER — Other Ambulatory Visit (HOSPITAL_COMMUNITY): Payer: Self-pay

## 2024-01-24 DIAGNOSIS — R109 Unspecified abdominal pain: Secondary | ICD-10-CM

## 2024-01-24 DIAGNOSIS — N200 Calculus of kidney: Secondary | ICD-10-CM

## 2024-01-24 MED ORDER — LISINOPRIL-HYDROCHLOROTHIAZIDE 20-12.5 MG PO TABS
1.0000 | ORAL_TABLET | Freq: Every day | ORAL | 3 refills | Status: AC
  Filled 2024-01-24 – 2024-02-21 (×2): qty 90, 90d supply, fill #0
  Filled 2024-04-13 – 2024-05-14 (×2): qty 90, 90d supply, fill #1
  Filled 2024-08-25: qty 90, 90d supply, fill #2
  Filled 2024-10-30 – 2024-11-27 (×2): qty 90, 90d supply, fill #3

## 2024-01-24 MED ORDER — GABAPENTIN 600 MG PO TABS
600.0000 mg | ORAL_TABLET | Freq: Three times a day (TID) | ORAL | 2 refills | Status: DC
  Filled 2024-01-24: qty 90, 30d supply, fill #0
  Filled 2024-02-21: qty 90, 30d supply, fill #1
  Filled 2024-03-20: qty 90, 30d supply, fill #2

## 2024-01-24 MED ORDER — CYCLOBENZAPRINE HCL 10 MG PO TABS
10.0000 mg | ORAL_TABLET | Freq: Three times a day (TID) | ORAL | 0 refills | Status: DC | PRN
  Filled 2024-01-24: qty 30, 10d supply, fill #0

## 2024-01-24 MED ORDER — SIMVASTATIN 40 MG PO TABS
40.0000 mg | ORAL_TABLET | Freq: Every evening | ORAL | 3 refills | Status: DC
  Filled 2024-01-24: qty 90, 90d supply, fill #0
  Filled 2024-04-13: qty 90, 90d supply, fill #1
  Filled 2024-07-10: qty 90, 90d supply, fill #2
  Filled 2024-10-13: qty 90, 90d supply, fill #3

## 2024-01-28 ENCOUNTER — Other Ambulatory Visit (HOSPITAL_COMMUNITY): Payer: Self-pay

## 2024-01-29 ENCOUNTER — Other Ambulatory Visit (HOSPITAL_COMMUNITY): Payer: Self-pay

## 2024-01-30 ENCOUNTER — Other Ambulatory Visit: Payer: Self-pay

## 2024-02-05 ENCOUNTER — Ambulatory Visit: Payer: 59 | Admitting: Adult Health

## 2024-02-05 ENCOUNTER — Encounter: Payer: Self-pay | Admitting: Adult Health

## 2024-02-05 VITALS — BP 119/62 | HR 80 | Ht <= 58 in | Wt 169.0 lb

## 2024-02-05 DIAGNOSIS — Z803 Family history of malignant neoplasm of breast: Secondary | ICD-10-CM

## 2024-02-05 DIAGNOSIS — Z1331 Encounter for screening for depression: Secondary | ICD-10-CM

## 2024-02-05 DIAGNOSIS — N6311 Unspecified lump in the right breast, upper outer quadrant: Secondary | ICD-10-CM

## 2024-02-05 DIAGNOSIS — N644 Mastodynia: Secondary | ICD-10-CM

## 2024-02-05 DIAGNOSIS — Z01419 Encounter for gynecological examination (general) (routine) without abnormal findings: Secondary | ICD-10-CM | POA: Diagnosis not present

## 2024-02-05 NOTE — Progress Notes (Signed)
 Patient ID: [name], female   DOB: 1966-09-12, 58 y.o.   MRN: [id number] History of Present Illness: [name] is a 58 year old white female, married, PM in for a well woman gyn exam. She is complaining of right breast pain and feels mass now. She had scheduled mammogram and [place] for 03/24/24 at [facility], but wants sooner if possible.      Component Value Date/Time   DIAGPAP (A) 01/11/2022 1012    - Atypical squamous cells of undetermined significance (ASC-US)   HPVHIGH Negative 01/11/2022 1012   ADEQPAP  01/11/2022 1012    Satisfactory for evaluation; transformation zone component ABSENT.   PCP is Dr Allena Katz   Current Medications, Allergies, Past Medical History, Past Surgical History, Family History and Social History were reviewed in [employer] electronic medical record.     Review of Systems: Patient denies any headaches, hearing loss, fatigue, blurred vision, shortness of breath, abdominal pain, problems with bowel movements, urination, or intercourse. No joint pain or mood swings.  Has chest pain on and off.  Has pain right breast for about 2 weeks and now feels a mass. Denies any vaginal bleeding   Physical Exam:BP 119/62 (BP Location: Left Arm, Patient Position: Sitting, Cuff Size: Normal)   Pulse 80   Ht 4\' 8"  (1.422 m)   Wt 169 lb (76.7 kg)   LMP 07/16/2012   BMI 37.89 kg/m   General:  Well developed, well nourished, no acute distress Skin:  Warm and dry Neck:  Midline trachea, normal thyroid, good ROM, no lymphadenopathy Lungs; Clear to auscultation bilaterally Breast:  No dominant palpable mass, retraction, or nipple discharge on the left, on the right, no retraction or nipple discharge, does have mass at 11 0' clock that is tender, non mobile, about 3 FB from areola, feels irregular around it like fibroglandular tissue Cardiovascular: Regular rate and rhythm Abdomen:  Soft, non tender, no hepatosplenomegaly Pelvic:  External genitalia is normal in appearance, no  lesions.  The vagina is pale. Urethra has no lesions or masses. The cervix is smooth.  Uterus is felt to be normal size, shape, and contour.  No adnexal masses or tenderness noted.Bladder is non tender, no masses felt. Rectal: Deferred. Had colonoscopy 2 weeks ago  Extremities/musculoskeletal:  No swelling or varicosities noted, no clubbing or cyanosis Psych:  No mood changes, alert and cooperative,seems happy AA is 0    02/05/2024    3:30 PM 10/17/2023    3:30 PM 06/17/2023    3:17 PM  Depression screen PHQ 2/9  Decreased Interest 1 1 1   Down, Depressed, Hopeless 1 1 2   PHQ - 2 Score 2 2 3   Altered sleeping 1 1 1   Tired, decreased energy 1 2 3   Change in appetite 1 1 2   Feeling bad or failure about yourself  0 0 0  Trouble concentrating 0 0 0  Moving slowly or fidgety/restless 0 0 0  Suicidal thoughts 0 0 0  PHQ-9 Score 5 6 9   Difficult doing work/chores  Somewhat difficult Somewhat difficult   On meds    02/05/2024    3:30 PM 10/17/2023    3:30 PM 06/17/2023    3:17 PM 02/11/2023    3:24 PM  GAD 7 : Generalized Anxiety Score  Nervous, Anxious, on Edge 1 1 1 1   Control/stop worrying 1 1 0 1  Worry too much - different things 1 1 1 1   Trouble relaxing 1 1 1 1   Restless 1 1  1 0  Easily annoyed or irritable 1 1 1  0  Afraid - awful might happen 1 2 1  0  Total GAD 7 Score 7 8 6 4   Anxiety Difficulty  Somewhat difficult Not difficult at all Not difficult at all      Upstream - 02/05/24 1528       Pregnancy Intention Screening   Does the patient want to become pregnant in the next year? N/A    Does the patient's partner want to become pregnant in the next year? N/A    Would the patient like to discuss contraceptive options today? N/A      Contraception Wrap Up   Current Method No Method - Other Reason   PM   End Method No Method - Other Reason   PM   Contraception Counseling Provided No             Examination chaperoned by Malachy Mood LPN  Impression and plan: 1.  Encounter for well woman exam with routine gynecological exam (Primary) Physical in 1 year Pap in 1 year Labs with PCP  Colonoscopy per GI Last mammogram was negative 07/04/23  2. Mass of upper outer quadrant of right breast  +mass at 11 0' clock that is tender, non mobile, about 3 FB from areola, feels irregular around it like fibroglandular tissue  3. Breast pain +right breast pain for about 2 weeks   4. Family history of breast cancer in first degree relative Mom had breast cancer in 88's Sister just diagnosed at 81

## 2024-02-07 NOTE — Telephone Encounter (Signed)
 Copied from CRM [id number]. Topic: Appointments - Scheduling Inquiry for Clinic >> Feb 07, 2024  9:00 AM Ivette P wrote: Reason for CRM: Pt is really wanting to be see sooner than 04/01. Pt is in pain from her right breast and would really want to know if something is there. Has had a history of breast cancer in the family. Pt would like a follow up call Number [phone]

## 2024-02-11 ENCOUNTER — Encounter (HOSPITAL_COMMUNITY): Payer: Self-pay

## 2024-02-11 ENCOUNTER — Ambulatory Visit (HOSPITAL_COMMUNITY)
Admission: RE | Admit: 2024-02-11 | Discharge: 2024-02-11 | Disposition: A | Discharge status: Home / Self Care | Source: Ambulatory Visit | Attending: Adult Health | Admitting: Adult Health

## 2024-02-11 DIAGNOSIS — N63 Unspecified lump in unspecified breast: Secondary | ICD-10-CM

## 2024-02-11 DIAGNOSIS — R92321 Mammographic fibroglandular density, right breast: Secondary | ICD-10-CM | POA: Diagnosis not present

## 2024-02-14 ENCOUNTER — Telehealth: Payer: Self-pay | Admitting: Internal Medicine

## 2024-02-14 ENCOUNTER — Ambulatory Visit: Payer: Self-pay | Admitting: Internal Medicine

## 2024-02-14 ENCOUNTER — Telehealth: Admitting: Internal Medicine

## 2024-02-14 ENCOUNTER — Encounter: Payer: Self-pay | Admitting: Internal Medicine

## 2024-02-14 DIAGNOSIS — M549 Dorsalgia, unspecified: Secondary | ICD-10-CM

## 2024-02-14 DIAGNOSIS — M5489 Other dorsalgia: Secondary | ICD-10-CM

## 2024-02-14 DIAGNOSIS — R0789 Other chest pain: Secondary | ICD-10-CM

## 2024-02-14 MED ORDER — CYCLOBENZAPRINE HCL 10 MG PO TABS
10.0000 mg | ORAL_TABLET | Freq: Two times a day (BID) | ORAL | 0 refills | Status: DC | PRN
Start: 2024-02-14 — End: 2024-04-14

## 2024-02-14 MED ORDER — MELOXICAM 7.5 MG PO TABS: 15.0000 mg | ORAL_TABLET | Freq: Every day | ORAL | 1 refills | Status: DC

## 2024-02-14 NOTE — Patient Instructions (Signed)
 Please take meloxicam as prescribed for chest wall pain.  Please take Flexeril as needed for back muscle spasms/stiffness.  Please avoid any overhead heavy lifting.  Okay to apply warm compresses as needed for mid back pain.

## 2024-02-14 NOTE — Telephone Encounter (Signed)
 [facility] called nurse Judeth Cornfield called said patient refuses ER for her chest pain and shortness of breath.

## 2024-02-14 NOTE — Progress Notes (Signed)
 Virtual Visit via Video Note   Because of [name]'s co-morbid illnesses, she is at least at moderate risk for complications without adequate follow up.  This format is felt to be most appropriate for this patient at this time.  All issues noted in this document were discussed and addressed.  A limited physical exam was performed with this format.      Evaluation Performed:  Follow-up visit  Date:  02/14/2024   ID:  [name], DOB [birth year], MRN [id number]  Patient Location: Home Provider Location: Office/Clinic  Participants: Patient Location of Patient: Home Location of Provider: Telehealth Consent was obtain for visit to be over via telehealth. I verified that I am speaking with the correct person using two identifiers.  PCP:  Anabel Halon, MD   Chief Complaint: Right-sided mid back pain  History of Present Illness:    [name] is a 58 y.o. female who has a video visit for complaint of right-sided mid back pain, radiating towards lower right breast area since 01/22/24.  Pain is constant, dull, and she describes it as if wrapping around her right side of the chest wall.  She has discomfort upon coughing and deep breaths, but denies dyspnea while sitting.  She was seen by OB/GYN on 02/05/24, had mammography done in this week which was negative for any acute changes/mass.  She has tried taking ibuprofen without much relief.  Denies any recent major injury or falls. No local rash reported.  The patient does not have symptoms concerning for COVID-19 infection (fever, chills, cough, or new shortness of breath).   Past Medical, Surgical, Social History, Allergies, and Medications have been Reviewed.  Past Medical History:  Diagnosis Date   Diabetes mellitus    insulin pump 2013   Dysphagia 03/09/2020   Epidural hematoma (HCC) 12/14/2021   GERD (gastroesophageal reflux disease)    History of kidney stones    Hypercholesteremia    Hypertension    Incarcerated  umbilical hernia 03/25/2014   Kidney stone    Kidney stones 08/31/2008   Qualifier: Diagnosis of  By: Erby Pian MD, Cornelius     Neuropathy    Obesity    Palpitations    Shortness of breath    Vertigo    Past Surgical History:  Procedure Laterality Date   BIOPSY  06/23/2020   Procedure: BIOPSY;  Surgeon: Corbin Ade, MD;  [facility];  Service: Endoscopy;;   CARDIAC CATHETERIZATION  12/11/2012   normal coronary arteries   CESAREAN SECTION  1994;2000   [place]   CHOLECYSTECTOMY  1992   COLONOSCOPY WITH PROPOFOL N/A 06/23/2020   large amount of stool in entire colon, prep inadequate.    COLONOSCOPY WITH PROPOFOL N/A 01/03/2024   Procedure: COLONOSCOPY WITH PROPOFOL;  Surgeon: Dolores Frame, MD;  [facility];  Service: Gastroenterology;  Laterality: N/A;  2:00pm;asa 1   CYSTOSCOPY W/ URETERAL STENT PLACEMENT  05/08/2012   Procedure: CYSTOSCOPY WITH RETROGRADE PYELOGRAM/URETERAL STENT PLACEMENT;  Surgeon: Ky Barban, MD;  Location: [facility];  Service: Urology;  Laterality: Right;   CYSTOSCOPY WITH RETROGRADE PYELOGRAM, URETEROSCOPY AND STENT PLACEMENT Right 05/04/2020   Procedure: CYSTOSCOPY WITH RIGHT RETROGRADE PYELOGRAM, RIGHT URETEROSCOPY AND RIGHT URETERAL STENT EXCHANGE;  Surgeon: Malen Gauze, MD;  Location: [facility];  Service: Urology;  Laterality: Right;   CYSTOSCOPY WITH RETROGRADE PYELOGRAM, URETEROSCOPY AND STENT PLACEMENT Left 08/08/2020   Procedure: CYSTOSCOPY WITH RETROGRADE PYELOGRAM, URETEROSCOPY WITH LASER  AND STENT PLACEMENT;  Surgeon: Malen Gauze, MD;  Location: [facility];  Service: Urology;  Laterality: Left;   CYSTOSCOPY WITH RETROGRADE PYELOGRAM, URETEROSCOPY AND STENT PLACEMENT Right 11/08/2020   Procedure: CYSTOSCOPY WITH RIGHT RETROGRADE PYELOGRAM, RIGHT URETEROSCOPY AND STENT PLACEMENT;  Surgeon: Malen Gauze, MD;  Location: [facility];  Service: Urology;  Laterality: Right;   CYSTOSCOPY WITH RETROGRADE  PYELOGRAM, URETEROSCOPY AND STENT PLACEMENT Left 12/27/2020   Procedure: CYSTOSCOPY WITH LEFT RETROGRADE PYELOGRAM, LEFT URETEROSCOPY AND LEFT URETERAL STENT PLACEMENT;  Surgeon: Malen Gauze, MD;  Location: [facility];  Service: Urology;  Laterality: Left;   CYSTOSCOPY WITH RETROGRADE PYELOGRAM, URETEROSCOPY AND STENT PLACEMENT Bilateral 03/16/2021   Procedure: CYSTOSCOPY WITH BILATERAL  RETROGRADE PYELOGRAM, BILATERAL URETEROSCOPY AND STENT PLACEMENT ON THE LEFT;  Surgeon: Malen Gauze, MD;  Location: [facility];  Service: Urology;  Laterality: Bilateral;   CYSTOSCOPY WITH RETROGRADE PYELOGRAM, URETEROSCOPY AND STENT PLACEMENT Left 06/28/2022   Procedure: CYSTOSCOPY WITH RETROGRADE PYELOGRAM, URETEROSCOPY AND STENT PLACEMENT;  Surgeon: Malen Gauze, MD;  [facility]: [facility];  Service: Urology;  Laterality: Left;   CYSTOSCOPY WITH RETROGRADE PYELOGRAM, URETEROSCOPY AND STENT PLACEMENT Bilateral 09/27/2022   Procedure: CYSTOSCOPY WITH BILATERAL RETROGRADE PYELOGRAM, URETEROSCOPY AND STENT PLACEMENT;  Surgeon: Malen Gauze, MD;  Location: [facility];  Service: Urology;  Laterality: Bilateral;  pt knows to arrive at 6:00   CYSTOSCOPY WITH STENT PLACEMENT Right 02/25/2020   Procedure: CYSTOSCOPY WITH RIGHT RETROGRADE RIGHT STENT PLACEMENT;  Surgeon: Jerilee Field, MD;  Location: [facility];  Service: Urology;  Laterality: Right;   ESOPHAGOGASTRODUODENOSCOPY (EGD) WITH PROPOFOL N/A 06/23/2020   Normal esophagus, multiple localized erosions were found in gastric antrum s/p biopsy, normal duodenum. Empiric dilation.Reactive gastropathy with erosions, negative H.pylori, no dysplasia.     EXTRACORPOREAL SHOCK WAVE LITHOTRIPSY Right 10/11/2020   Procedure: EXTRACORPOREAL SHOCK WAVE LITHOTRIPSY (ESWL);  Surgeon: Malen Gauze, MD;  Location: [facility];  Service: Urology;  Laterality: Right;   FOOT SURGERY Right March, 2013   [facility]-removal of bone spur   HOLMIUM LASER APPLICATION  05/04/2020    Procedure: HOLMIUM LASER LITHOTRIPSY RIGHT URETERAL CALCULUS;  Surgeon: Malen Gauze, MD;  Location: [facility];  Service: Urology;;   HYSTEROSCOPY WITH THERMACHOICE  04/25/2012   Procedure: HYSTEROSCOPY WITH THERMACHOICE;  Surgeon: Lazaro Arms, MD;  Location: [facility];  Service: Gynecology;  Laterality: N/A;  total therapy time= 11 minutes 42 seconds; 34 ml D5w  in and 34 ml D5w out; temp =87 degrees F   LEFT HEART CATHETERIZATION WITH CORONARY ANGIOGRAM N/A 12/11/2012   Procedure: LEFT HEART CATHETERIZATION WITH CORONARY ANGIOGRAM;  Surgeon: Runell Gess, MD;  Location[facility] CATH LAB;  Service: Cardiovascular;  Laterality: N/A;   LUMBAR WOUND DEBRIDEMENT N/A 12/14/2021   Procedure: LUMBAR HEMATOMA EVACUATION;  Surgeon: Jadene Pierini, MD;  Location: [facility];  Service: Neurosurgery;  Laterality: N/A;   MALONEY DILATION N/A 06/23/2020   Procedure: [name] DILATION;  Surgeon: Corbin Ade, MD;  [facility];  Service: Endoscopy;  Laterality: N/A;   Sinus sergery  1988   [place]   STONE EXTRACTION WITH BASKET Right 11/08/2020   Procedure: STONE EXTRACTION WITH BASKET;  Surgeon: Malen Gauze, MD;  Location: [facility];  Service: Urology;  Laterality: Right;   TRANSFORAMINAL LUMBAR INTERBODY FUSION (TLIF) WITH PEDICLE SCREW FIXATION 1 LEVEL N/A 12/05/2021   Procedure: Open Lumbar five-Sacral one Laminectomy/Transforaminal Lumbar Interbody Fusion/Posterolateral instrumented fusion;  Surgeon: Jadene Pierini, MD;  [facility]  OR;  Service: Neurosurgery;  Laterality: N/A;  3C   UMBILICAL HERNIA REPAIR N/A 03/25/2014   Procedure: UMBILICAL HERNIA REPAIR;  Surgeon: Marlane Hatcher, MD;  Location: [facility];  Service: General;  Laterality: N/A;   uterine ablation       No outpatient medications have been marked as taking for the 02/14/24 encounter (Appointment) with Anabel Halon, MD.   Current Facility-Administered Medications for the 02/14/24 encounter (Appointment) with Anabel Halon, MD  Medication   methylPREDNISolone acetate (DEPO-MEDROL) injection 40 mg     Allergies:   Ciprofloxacin, Penicillins, Codeine, Morphine, Sulfonamide derivatives, and Vancomycin   ROS:   Please see the history of present illness. All other systems reviewed and are negative.   Labs/Other Tests and Data Reviewed:    Recent Labs: 05/28/2023: ALT 22; BUN 14; Creatinine, Ser 0.73; Potassium 4.3; Sodium 140; TSH 1.980   Recent Lipid Panel Lab Results  Component Value Date/Time   CHOL 144 05/28/2023 03:13 PM   TRIG 230 (H) 05/28/2023 03:13 PM   HDL 50 05/28/2023 03:13 PM   CHOLHDL 2.9 05/28/2023 03:13 PM   CHOLHDL 4.0 07/20/2013 11:00 AM   LDLCALC 57 05/28/2023 03:13 PM    Wt Readings from Last 3 Encounters:  02/05/24 169 lb (76.7 kg)  01/13/24 170 lb 3.2 oz (77.2 kg)  01/03/24 164 lb (74.4 kg)     Objective:    Vital Signs:  LMP 07/16/2012    VITAL SIGNS:  reviewed GEN:  no acute distress EYES:  sclerae anicteric, EOMI - Extraocular Movements Intact RESPIRATORY:  normal respiratory effort, symmetric expansion NEURO:  alert and oriented x 3, no obvious focal deficit PSYCH:  normal affect  ASSESSMENT & PLAN:    Mid back pain on right side Has right-sided mid back pain, radiating towards the lower breast area Likely due to muscular strain Mobic 15 mg PRN for pain Flexeril as needed for muscle spasms/stiffness Avoid overhead heavy lifting Can apply warm compresses  Right-sided chest wall pain Right sided chest wall/breast area pain likely radiating pain from the mid back She reports discomfort upon coughing or deep breath, could be costochondritis as well - check CXR to rule out other pulmonary etiology Meloxicam as needed for pain - can help with costochondritis   I discussed the assessment and treatment plan with the patient. The patient was provided an opportunity to ask questions, and all were answered. The patient agreed with the plan and demonstrated  an understanding of the instructions.   The patient was advised to call back or seek an in-person evaluation if the symptoms worsen or if the condition fails to improve as anticipated.  The above assessment and management plan was discussed with the patient. The patient verbalized understanding of and has agreed to the management plan.   Medication Adjustments/Labs and Tests Ordered: Current medicines are reviewed at length with the patient today.  Concerns regarding medicines are outlined above.   Tests Ordered: No orders of the defined types were placed in this encounter.   Medication Changes: No orders of the defined types were placed in this encounter.    Note: This dictation was prepared with Dragon dictation along with smaller phrase technology. Similar sounding words can be transcribed inadequately or may not be corrected upon review. Any transcriptional errors that result from this process are unintentional.      Disposition:  Follow up  Signed, Anabel Halon, MD  02/14/2024 11:37 AM     [facility]  [facility]

## 2024-02-14 NOTE — Assessment & Plan Note (Signed)
 Right sided chest wall/breast area pain likely radiating pain from the mid back She reports discomfort upon coughing or deep breath, could be costochondritis as well - check CXR to rule out other pulmonary etiology Meloxicam as needed for pain - can help with costochondritis

## 2024-02-14 NOTE — Telephone Encounter (Signed)
  Chief Complaint: right sided breast/chest pain Symptoms: right breast/chest pain wraps around to back, SOB Frequency: constant x 2 weeks Pertinent Negatives: Patient denies nausea, vomiting, dizziness, sweating, fever, cough Disposition: [x] ED /[] Urgent Care (no appt availability in office) / [] Appointment(In office/virtual)/ []  [facility]/ [] Home Care/ [x] Refused Recommended Disposition /[]  Mobile Bus/ []  Follow-up with PCP Additional Notes: Patient complains of constant right sided chest/breast pain that causes her SOB and wraps around to her back She states she had a mammogram that ruled out any breast issues. Patient states she has taken OTC pain medication. Advised call 911 and go to ED, patient states she does not want to go and sit and wait at ED. She is requesting an office visit. Called [facility] and spoke with, Jacki Cones , notified staff of ED refusal.  Copied from CRM [id number]. Topic: Clinical - Red Word Triage >> Feb 14, 2024 10:29 AM [name] wrote: Red Word that prompted transfer to Nurse Triage: Chest pain on right side, not getting any better. Pain is radiating from right side breast around to her back   Hurts to breathe Reason for Disposition  [1] Chest pain lasts > 5 minutes AND [2] age > 55  Answer Assessment - Initial Assessment Questions 1. LOCATION: "Where does it hurt?"       Right sided chest pain, in right breast.  2. RADIATION: "Does the pain go anywhere else?" (e.g., into neck, jaw, arms, back)     Back.  3. ONSET: "When did the chest pain begin?" (Minutes, hours or days)      X 2 weeks.  4. PATTERN: "Does the pain come and go, or has it been constant since it started?"  "Does it get worse with exertion?"      Constant, "it takes my breath away, hard for me to catch my breath".  5. DURATION: "How long does it last" (e.g., seconds, minutes, hours)     All day long.  6. SEVERITY: "How bad is the pain?"  (e.g., Scale 1-10; mild, moderate, or  severe)    - MILD (1-3): doesn't interfere with normal activities     - MODERATE (4-7): interferes with normal activities or awakens from sleep    - SEVERE (8-10): excruciating pain, unable to do any normal activities       8/10.  7. CARDIAC RISK FACTORS: "Do you have any history of heart problems or risk factors for heart disease?" (e.g., angina, prior heart attack; diabetes, high blood pressure, high cholesterol, smoker, or strong family history of heart disease)     Diabetes, family history of heart disease (mother)  8. PULMONARY RISK FACTORS: "Do you have any history of lung disease?"  (e.g., blood clots in lung, asthma, emphysema, birth control pills)     Denies.  9. CAUSE: "What do you think is causing the chest pain?"     Unsure. She states she had a mammogram that was normal and they recommended she see her PCP. She states her grandmother died of lung cancer so she is worried/scared about that.  10. OTHER SYMPTOMS: "Do you have any other symptoms?" (e.g., dizziness, nausea, vomiting, sweating, fever, difficulty breathing, cough)       SOB.  Protocols used: Chest Pain-A-AH

## 2024-02-14 NOTE — Telephone Encounter (Signed)
 error

## 2024-02-14 NOTE — Assessment & Plan Note (Signed)
 Has right-sided mid back pain, radiating towards the lower breast area Likely due to muscular strain Mobic 15 mg PRN for pain Flexeril as needed for muscle spasms/stiffness Avoid overhead heavy lifting Can apply warm compresses

## 2024-02-17 ENCOUNTER — Ambulatory Visit (HOSPITAL_COMMUNITY)
Admission: RE | Admit: 2024-02-17 | Discharge: 2024-02-17 | Disposition: A | Discharge status: Home / Self Care | Source: Ambulatory Visit | Attending: Internal Medicine | Admitting: Internal Medicine

## 2024-02-17 DIAGNOSIS — R0789 Other chest pain: Secondary | ICD-10-CM | POA: Diagnosis not present

## 2024-02-18 ENCOUNTER — Ambulatory Visit: Payer: 59 | Admitting: Internal Medicine

## 2024-02-21 ENCOUNTER — Other Ambulatory Visit: Payer: Self-pay | Admitting: Orthopedic Surgery

## 2024-02-21 ENCOUNTER — Other Ambulatory Visit: Payer: Self-pay

## 2024-02-21 ENCOUNTER — Telehealth: Payer: Self-pay

## 2024-02-21 ENCOUNTER — Other Ambulatory Visit (HOSPITAL_COMMUNITY): Payer: Self-pay

## 2024-02-21 ENCOUNTER — Other Ambulatory Visit: Payer: Self-pay | Admitting: Internal Medicine

## 2024-02-21 DIAGNOSIS — R200 Anesthesia of skin: Secondary | ICD-10-CM

## 2024-02-21 DIAGNOSIS — N200 Calculus of kidney: Secondary | ICD-10-CM

## 2024-02-21 DIAGNOSIS — R109 Unspecified abdominal pain: Secondary | ICD-10-CM

## 2024-02-21 DIAGNOSIS — G5601 Carpal tunnel syndrome, right upper limb: Secondary | ICD-10-CM

## 2024-02-21 NOTE — Telephone Encounter (Signed)
 Copied from CRM [id number]. Topic: Clinical - Lab/Test Results >> Feb 21, 2024 12:22 PM Clayton Bibles wrote: Reason for CRM: Please have a nurse to call [name] about her results from her [facility]. She can be reached at [phone]. Thanks

## 2024-02-21 NOTE — Telephone Encounter (Signed)
 Awaiting results from radiology dept

## 2024-02-24 ENCOUNTER — Ambulatory Visit
Admission: EM | Admit: 2024-02-24 | Discharge: 2024-02-24 | Disposition: A | Discharge status: Home / Self Care | Attending: Family Medicine | Admitting: Family Medicine

## 2024-02-24 ENCOUNTER — Other Ambulatory Visit (HOSPITAL_COMMUNITY): Payer: Self-pay

## 2024-02-24 ENCOUNTER — Ambulatory Visit: Payer: Self-pay | Admitting: Internal Medicine

## 2024-02-24 ENCOUNTER — Other Ambulatory Visit: Payer: Self-pay

## 2024-02-24 DIAGNOSIS — L84 Corns and callosities: Secondary | ICD-10-CM

## 2024-02-24 DIAGNOSIS — M79674 Pain in right toe(s): Secondary | ICD-10-CM | POA: Diagnosis not present

## 2024-02-24 MED ORDER — MELOXICAM 7.5 MG PO TABS
7.5000 mg | ORAL_TABLET | Freq: Every day | ORAL | 5 refills | Status: DC
  Filled 2024-02-24: qty 30, 30d supply, fill #0
  Filled 2024-03-20: qty 30, 30d supply, fill #1
  Filled 2024-04-13: qty 30, 30d supply, fill #2
  Filled 2024-05-14: qty 30, 30d supply, fill #3
  Filled 2024-06-19: qty 30, 30d supply, fill #4
  Filled 2024-07-24: qty 30, 30d supply, fill #5

## 2024-02-24 MED ORDER — MUPIROCIN 2 % EX OINT: 1.0000 | TOPICAL_OINTMENT | Freq: Two times a day (BID) | CUTANEOUS | 0 refills | Status: DC

## 2024-02-24 MED ORDER — DOXYCYCLINE HYCLATE 100 MG PO CAPS: 100.0000 mg | ORAL_CAPSULE | Freq: Two times a day (BID) | ORAL | 0 refills | Status: DC

## 2024-02-24 NOTE — Telephone Encounter (Signed)
Keep appt for tomorrow

## 2024-02-24 NOTE — ED Provider Notes (Signed)
 [facility]    CSN: [id number] Arrival date & time: 02/24/24  1527      History   Chief Complaint No chief complaint on file.   HPI [name] is a 58 y.o. female.   Patient presenting today with pain, redness, swelling to the right second and third toes over the past few days.  Denies known injury, fevers, chills, drainage, new soaps or products use.  So far not trying thing over-the-counter for symptoms.  States she has a history of diabetes.     Past Medical History:  Diagnosis Date   Diabetes mellitus    insulin pump 2013   Dysphagia 03/09/2020   Epidural hematoma (HCC) 12/14/2021   GERD (gastroesophageal reflux disease)    History of kidney stones    Hypercholesteremia    Hypertension    Incarcerated umbilical hernia 03/25/2014   Kidney stone    Kidney stones 08/31/2008   Qualifier: Diagnosis of  By: Erby Pian MD, Cornelius     Neuropathy    Obesity    Palpitations    Shortness of breath    Vertigo     Patient Active Problem List   Diagnosis Date Noted   Right-sided chest wall pain 02/14/2024   Breast pain 02/05/2024   Mass of upper outer quadrant of right breast 02/05/2024   Encounter for well woman exam with routine gynecological exam 02/05/2024   Family history of breast cancer in first degree relative 02/05/2024   Rash and nonspecific skin eruption 01/13/2024   Special screening for malignant neoplasms, colon 01/03/2024   Skin infection 12/27/2023   Chronic left flank pain 10/18/2023   B12 deficiency 06/17/2023   Long-term (current) use of injectable non-insulin antidiabetic drugs 05/30/2023   Epidermoid cyst of skin of scalp 04/15/2023   Cervical spinal stenosis 01/30/2023   Nodule of finger of right hand 01/22/2023   Acute idiopathic gout 01/01/2023   Fecal incontinence 01/01/2023   Vertigo 08/09/2022   Chronic mucoid otitis media of left ear 05/16/2022   Primary insomnia 05/09/2022   ASCUS of cervix with negative high risk HPV  01/16/2022   Spondylolisthesis of lumbar region 12/05/2021   Spinal stenosis of lumbar region with neurogenic claudication 10/25/2021   Chronic bilateral low back pain with left-sided sciatica 10/18/2021   Change in bowel habits 01/18/2021   Mid back pain on right side 10/31/2020   GERD (gastroesophageal reflux disease) 03/09/2020   Other intervertebral disc degeneration, lumbar region 05/24/2019   OSA (obstructive sleep apnea) 12/08/2014   Depression 07/15/2013   Dyspnea 11/12/2012   Headache 10/14/2012   Neuropathy 10/14/2012   Essential hypertension, benign 05/10/2012   Diabetes mellitus (HCC) 09/29/2008   Mixed hyperlipidemia 09/29/2008   Class 2 severe obesity due to excess calories with serious comorbidity and body mass index (BMI) of 38.0 to 38.9 in adult [facility]) 08/31/2008   Recurrent nephrolithiasis 08/31/2008    Past Surgical History:  Procedure Laterality Date   BIOPSY  06/23/2020   Procedure: BIOPSY;  Surgeon: Corbin Ade, MD;  [facility];  Service: Endoscopy;;   CARDIAC CATHETERIZATION  12/11/2012   normal coronary arteries   CESAREAN SECTION  1994;2000   [place]   CHOLECYSTECTOMY  1992   COLONOSCOPY WITH PROPOFOL N/A 06/23/2020   large amount of stool in entire colon, prep inadequate.    COLONOSCOPY WITH PROPOFOL N/A 01/03/2024   Procedure: COLONOSCOPY WITH PROPOFOL;  Surgeon: Dolores Frame, MD;  [facility];  Service: Gastroenterology;  Laterality: N/A;  2:00pm;asa 1   CYSTOSCOPY W/ URETERAL STENT PLACEMENT  05/08/2012   Procedure: CYSTOSCOPY WITH RETROGRADE PYELOGRAM/URETERAL STENT PLACEMENT;  Surgeon: Ky Barban, MD;  Location: [facility];  Service: Urology;  Laterality: Right;   CYSTOSCOPY WITH RETROGRADE PYELOGRAM, URETEROSCOPY AND STENT PLACEMENT Right 05/04/2020   Procedure: CYSTOSCOPY WITH RIGHT RETROGRADE PYELOGRAM, RIGHT URETEROSCOPY AND RIGHT URETERAL STENT EXCHANGE;  Surgeon: Malen Gauze, MD;  Location: [facility];   Service: Urology;  Laterality: Right;   CYSTOSCOPY WITH RETROGRADE PYELOGRAM, URETEROSCOPY AND STENT PLACEMENT Left 08/08/2020   Procedure: CYSTOSCOPY WITH RETROGRADE PYELOGRAM, URETEROSCOPY WITH LASER  AND STENT PLACEMENT;  Surgeon: Malen Gauze, MD;  Location: [facility];  Service: Urology;  Laterality: Left;   CYSTOSCOPY WITH RETROGRADE PYELOGRAM, URETEROSCOPY AND STENT PLACEMENT Right 11/08/2020   Procedure: CYSTOSCOPY WITH RIGHT RETROGRADE PYELOGRAM, RIGHT URETEROSCOPY AND STENT PLACEMENT;  Surgeon: Malen Gauze, MD;  Location: [facility];  Service: Urology;  Laterality: Right;   CYSTOSCOPY WITH RETROGRADE PYELOGRAM, URETEROSCOPY AND STENT PLACEMENT Left 12/27/2020   Procedure: CYSTOSCOPY WITH LEFT RETROGRADE PYELOGRAM, LEFT URETEROSCOPY AND LEFT URETERAL STENT PLACEMENT;  Surgeon: Malen Gauze, MD;  Location: [facility];  Service: Urology;  Laterality: Left;   CYSTOSCOPY WITH RETROGRADE PYELOGRAM, URETEROSCOPY AND STENT PLACEMENT Bilateral 03/16/2021   Procedure: CYSTOSCOPY WITH BILATERAL  RETROGRADE PYELOGRAM, BILATERAL URETEROSCOPY AND STENT PLACEMENT ON THE LEFT;  Surgeon: Malen Gauze, MD;  Location: [facility];  Service: Urology;  Laterality: Bilateral;   CYSTOSCOPY WITH RETROGRADE PYELOGRAM, URETEROSCOPY AND STENT PLACEMENT Left 06/28/2022   Procedure: CYSTOSCOPY WITH RETROGRADE PYELOGRAM, URETEROSCOPY AND STENT PLACEMENT;  Surgeon: Malen Gauze, MD;  Location: [facility];  Service: Urology;  Laterality: Left;   CYSTOSCOPY WITH RETROGRADE PYELOGRAM, URETEROSCOPY AND STENT PLACEMENT Bilateral 09/27/2022   Procedure: CYSTOSCOPY WITH BILATERAL RETROGRADE PYELOGRAM, URETEROSCOPY AND STENT PLACEMENT;  Surgeon: Malen Gauze, MD;  Location: [facility];  Service: Urology;  Laterality: Bilateral;  pt knows to arrive at 6:00   CYSTOSCOPY WITH STENT PLACEMENT Right 02/25/2020   Procedure: CYSTOSCOPY WITH RIGHT RETROGRADE RIGHT STENT PLACEMENT;  Surgeon: Jerilee Field, MD;  Location: [facility];  Service: Urology;  Laterality: Right;   ESOPHAGOGASTRODUODENOSCOPY (EGD) WITH PROPOFOL N/A 06/23/2020   Normal esophagus, multiple localized erosions were found in gastric antrum s/p biopsy, normal duodenum. Empiric dilation.Reactive gastropathy with erosions, negative H.pylori, no dysplasia.     EXTRACORPOREAL SHOCK WAVE LITHOTRIPSY Right 10/11/2020   Procedure: EXTRACORPOREAL SHOCK WAVE LITHOTRIPSY (ESWL);  Surgeon: Malen Gauze, MD;  Location: [facility];  Service: Urology;  Laterality: Right;   FOOT SURGERY Right March, 2013   [facility]-removal of bone spur   HOLMIUM LASER APPLICATION  05/04/2020   Procedure: HOLMIUM LASER LITHOTRIPSY RIGHT URETERAL CALCULUS;  Surgeon: Malen Gauze, MD;  Location: [facility];  Service: Urology;;   HYSTEROSCOPY WITH THERMACHOICE  04/25/2012   Procedure: HYSTEROSCOPY WITH THERMACHOICE;  Surgeon: Lazaro Arms, MD;  Location: [facility];  Service: Gynecology;  Laterality: N/A;  total therapy time= 11 minutes 42 seconds; 34 ml D5w  in and 34 ml D5w out; temp =87 degrees F   LEFT HEART CATHETERIZATION WITH CORONARY ANGIOGRAM N/A 12/11/2012   Procedure: LEFT HEART CATHETERIZATION WITH CORONARY ANGIOGRAM;  Surgeon: Runell Gess, MD;  Location[facility] LAB;  Service: Cardiovascular;  Laterality: N/A;   LUMBAR WOUND DEBRIDEMENT N/A 12/14/2021   Procedure: LUMBAR HEMATOMA EVACUATION;  Surgeon: Jadene Pierini, MD;  Location: [facility];  Service: Neurosurgery;  Laterality: N/A;  MALONEY DILATION N/A 06/23/2020   Procedure: [name] DILATION;  Surgeon: Corbin Ade, MD;  [facility];  Service: Endoscopy;  Laterality: N/A;   Sinus sergery  1988   [place]   STONE EXTRACTION WITH BASKET Right 11/08/2020   Procedure: STONE EXTRACTION WITH BASKET;  Surgeon: Malen Gauze, MD;  Location: [facility];  Service: Urology;  Laterality: Right;   TRANSFORAMINAL LUMBAR INTERBODY FUSION (TLIF) WITH PEDICLE SCREW FIXATION 1 LEVEL N/A 12/05/2021    Procedure: Open Lumbar five-Sacral one Laminectomy/Transforaminal Lumbar Interbody Fusion/Posterolateral instrumented fusion;  Surgeon: Jadene Pierini, MD;  Location: [facility];  Service: Neurosurgery;  Laterality: N/A;  3C   UMBILICAL HERNIA REPAIR N/A 03/25/2014   Procedure: UMBILICAL HERNIA REPAIR;  Surgeon: Marlane Hatcher, MD;  Location: [facility];  Service: General;  Laterality: N/A;   uterine ablation      OB History     Gravida  2   Para  2   Term  2   Preterm      AB      Living  2      SAB      IAB      Ectopic      Multiple      Live Births  2            Home Medications    Prior to Admission medications   Medication Sig Start Date End Date Taking? Authorizing Provider  doxycycline (VIBRAMYCIN) 100 MG capsule Take 1 capsule (100 mg total) by mouth 2 (two) times daily. 02/24/24  Yes Particia Nearing, PA-C  mupirocin ointment (BACTROBAN) 2 % Apply 1 Application topically 2 (two) times daily. 02/24/24  Yes Particia Nearing, PA-C  albuterol (VENTOLIN HFA) 108 (90 Base) MCG/ACT inhaler Inhale 2 puffs into the lungs every 6 (six) hours as needed for wheezing or shortness of breath.  08/27/19   [provider]  blood glucose meter kit and supplies Use up to four times daily as directed. 08/31/21   Heather Roberts, NP  citalopram (CELEXA) 10 MG tablet Take 1 tablet (10 mg total) by mouth daily. 10/17/23   Anabel Halon, MD  cyclobenzaprine (FLEXERIL) 10 MG tablet Take 1 tablet (10 mg total) by mouth 2 (two) times daily as needed for muscle spasms. 02/14/24   Anabel Halon, MD  Dulaglutide (TRULICITY) 4.5 MG/0.5ML SOAJ Inject 4.5 mg as directed once a week. 05/29/23   Roma Kayser, MD  Empagliflozin-metFORMIN HCl (SYNJARDY) 12.5-500 MG TABS Take 1 tablet by mouth daily. 12/16/23   Anabel Halon, MD  furosemide (LASIX) 20 MG tablet Take 20 mg by mouth 2 (two) times daily as needed for edema.    [provider]  gabapentin  (NEURONTIN) 600 MG tablet Take 1 tablet (600 mg total) by mouth 3 (three) times daily. 01/24/24   Anabel Halon, MD  glucose blood test strip Test up to 4 times a day 10/02/21   Anabel Halon, MD  hydrOXYzine (VISTARIL) 25 MG capsule Take 1 capsule (25 mg total) by mouth every 8 (eight) hours as needed for itching. 01/13/24   Billie Lade, MD  Lancets (FREESTYLE) lancets Use to test 4 times daily 08/31/21   Heather Roberts, NP  lisinopril-hydrochlorothiazide (ZESTORETIC) 20-12.5 MG tablet Take 1 tablet by mouth daily. 01/24/24   Anabel Halon, MD  lubiprostone (AMITIZA) 8 MCG capsule Take 1 capsule (8 mcg total) by mouth 2 (two) times daily with  a meal. 01/06/24   Marguerita Merles, Reuel Boom, MD  meclizine (ANTIVERT) 25 MG tablet Take 1 tablet (25 mg total) by mouth 2 (two) times daily as needed for dizziness. 10/24/23   Anabel Halon, MD  meloxicam (MOBIC) 7.5 MG tablet Take 2 tablets (15 mg total) by mouth daily. 02/14/24   Anabel Halon, MD  meloxicam (MOBIC) 7.5 MG tablet Take 1 tablet (7.5 mg total) by mouth daily. 02/24/24   Vickki Hearing, MD  omeprazole (PRILOSEC) 40 MG capsule Take 1 capsule (40 mg total) by mouth daily. 10/24/23   Anabel Halon, MD  ondansetron (ZOFRAN) 4 MG tablet Take 1 tablet (4 mg total) by mouth daily as needed for nausea or vomiting. 10/17/23 10/16/24  Anabel Halon, MD  promethazine (PHENERGAN) 25 MG tablet TAKE 1 TABLET(25 MG) BY MOUTH EVERY 8 HOURS AS NEEDED FOR NAUSEA OR VOMITING 09/04/22   Billie Lade, MD  simvastatin (ZOCOR) 40 MG tablet Take 1 tablet (40 mg total) by mouth every evening for cholesterol 01/24/24   Anabel Halon, MD  triamcinolone (KENALOG) 0.025 % ointment Apply 1 Application topically 2 (two) times daily. 12/23/23   Freddy Finner, NP  zolpidem (AMBIEN) 5 MG tablet Take 1 tablet (5 mg total) by mouth at bedtime as needed for sleep. 11/26/23   Anabel Halon, MD    Family History Family History  Problem Relation Age of Onset    Breast cancer Mother    Hypertension Mother    Cancer Mother    Diabetes Mother    Stroke Mother    Alzheimer's disease Mother    Diabetes Father    Breast cancer Sister    Cancer Paternal Uncle    Breast cancer Maternal Grandmother    Breast cancer Paternal Grandmother    Depression Paternal Grandmother    Depression Paternal Grandfather    Heart disease Other    Cancer Other    Diabetes Other    Achalasia Other    Anesthesia problems Neg Hx    Hypotension Neg Hx    Malignant hyperthermia Neg Hx    Pseudochol deficiency Neg Hx    Colon cancer Neg Hx    Colon polyps Neg Hx     Social History Social History   Tobacco Use   Smoking status: Never   Smokeless tobacco: Never  Vaping Use   Vaping status: Never Used  Substance Use Topics   Alcohol use: No   Drug use: No     Allergies   Ciprofloxacin, Penicillins, Codeine, Morphine, Sulfonamide derivatives, and Vancomycin   Review of Systems Review of Systems Per HPI  Physical Exam Triage Vital Signs ED Triage Vitals [02/24/24 1709]  Encounter Vitals Group     BP 102/64     Systolic BP Percentile      Diastolic BP Percentile      Pulse Rate 78     Resp 13     Temp 98.3 F (36.8 C)     Temp src      SpO2 94 %     Weight      Height      Head Circumference      Peak Flow      Pain Score 9     Pain Loc      Pain Education      Exclude from Growth Chart    No data found.  Updated Vital Signs BP 102/64 (BP Location: Right Arm)   Pulse  78   Temp 98.3 F (36.8 C)   Resp 13   LMP 07/16/2012   SpO2 94%   Visual Acuity Right Eye Distance:   Left Eye Distance:   Bilateral Distance:    Right Eye Near:   Left Eye Near:    Bilateral Near:     Physical Exam Vitals and nursing note reviewed.  Constitutional:      Appearance: Normal appearance. She is not ill-appearing.  HENT:     Head: Atraumatic.  Eyes:     Extraocular Movements: Extraocular movements intact.     Conjunctiva/sclera:  Conjunctivae normal.  Cardiovascular:     Rate and Rhythm: Normal rate.  Pulmonary:     Effort: Pulmonary effort is normal.  Musculoskeletal:        General: Normal range of motion.     Cervical back: Normal range of motion and neck supple.  Skin:    General: Skin is warm.     Findings: Erythema present.     Comments: Circular slightly macerated lesion forming to the medial aspect of the third toe with some mild erythema in the surrounding area.  Neurological:     Mental Status: She is alert and oriented to person, place, and time.     Motor: No weakness.     Gait: Gait normal.     Comments: Right lower extremity neurovascularly intact  Psychiatric:        Mood and Affect: Mood normal.        Thought Content: Thought content normal.        Judgment: Judgment normal.      UC Treatments / Results  Labs (all labs ordered are listed, but only abnormal results are displayed) Labs Reviewed - No data to display  EKG   Radiology No results found.  Procedures Procedures (including critical care time)  Medications Ordered in UC Medications - No data to display  Initial Impression / Assessment and Plan / UC Course  I have reviewed the triage vital signs and the nursing notes.  Pertinent labs & imaging results that were available during my care of the patient were reviewed by me and considered in my medical decision making (see chart for details).     Consistent with a friction callus.  Will treat aggressively given her history of diabetes with course of doxycycline, mupirocin, Epsom salt soaks and avoiding any tightly fitting shoes.  Podiatry follow-up recommended.  Final Clinical Impressions(s) / UC Diagnoses   Final diagnoses:  Pain of toe of right foot  Callus between toes     Discharge Instructions      May do warm Epsom salt soaks, take the full course of antibiotics and apply the mupirocin ointment twice daily.  Try not to wear shoes that are tight around your  toes.  Follow-up with a podiatrist, I recommend [facility]    ED Prescriptions     Medication Sig Dispense Auth. Provider   doxycycline (VIBRAMYCIN) 100 MG capsule Take 1 capsule (100 mg total) by mouth 2 (two) times daily. 14 capsule Particia Nearing, [place]   mupirocin ointment (BACTROBAN) 2 % Apply 1 Application topically 2 (two) times daily. 22 g Particia Nearing, [place]      PDMP not reviewed this encounter.   Particia Nearing, [place] 02/24/24 1733

## 2024-02-24 NOTE — Telephone Encounter (Signed)
 Chief Complaint: Right 2nd and 3rd toe Symptoms: painful, burning, discoloration Frequency: "a few days" Pertinent Negatives: Patient denies fever, redness Disposition: [] ED /[] Urgent Care (no appt availability in office) / [x] Appointment(In office/virtual)/ []  [facility]/ [] Home Care/ [] Refused Recommended Disposition /[] [place] [facility]/ []  Follow-up with PCP Additional Notes: Pt reports she has been experiencing swelling, pain and burning in her right second and third toes. Pt denies redness, warmth, fever. Pt reports she is diabetic and one of the toes is beginning to look purple. OV offered today, pt works until 3, she is requesting to be worked in this afternoon, OV scheduled tomorrow. This RN educated pt on home care, new-worsening symptoms, when to call back/seek emergent care. Pt verbalized understanding and agrees to plan.    Copied from CRM [id number]. Topic: Clinical - Red Word Triage >> Feb 24, 2024 11:41 AM Elle L wrote: Red Word that prompted transfer to Nurse Triage: The patient states that two of her toes have been swollen, painful, and hard to walk on. It started a week ago and has been worsening and is now turning purple. She is a diabetic. Reason for Disposition  [1] Purple or black skin on foot or toe AND [2] new or increased  Answer Assessment - Initial Assessment Questions 1. SYMPTOM: "What's the main symptom you're concerned about?" (e.g., rash, sore, callus, drainage, numbness)     Swelling, pain 2. LOCATION: "Where is the swelling  located?" (e.g., foot/toe, top/bottom, left/right)     Right 2nd and 3rd toe 3. APPEARANCE: "What does the area look like?" (e.g., normal, red, swollen; size)     Swollen, one looks like it is turning purple 4. ONSET: "When did the  swelling/pain  start?"     "for a few days" 5. PAIN: "Is there any pain?" If Yes, ask: "How bad is it?" (Scale: 1-10; mild, moderate, severe)     9/10, difficult to walk on 6. CAUSE: "What do  you think is causing the symptoms?"     Unknown 7. OTHER SYMPTOMS: "Do you have any other symptoms?" (e.g., fever, weakness)     None  Protocols used: Diabetes - Foot Problems and Questions-A-AH

## 2024-02-24 NOTE — ED Triage Notes (Signed)
 Pt reports right foot middle toe pain with throbbing burning and swelling pt states she is diabetic. Denies injury to the foot.

## 2024-02-24 NOTE — Discharge Instructions (Signed)
 May do warm Epsom salt soaks, take the full course of antibiotics and apply the mupirocin ointment twice daily.  Try not to wear shoes that are tight around your toes.  Follow-up with a podiatrist, I recommend [facility]

## 2024-02-25 ENCOUNTER — Ambulatory Visit: Payer: Self-pay | Admitting: Family Medicine

## 2024-02-26 ENCOUNTER — Telehealth: Admitting: Family Medicine

## 2024-02-26 ENCOUNTER — Ambulatory Visit: Payer: Self-pay | Admitting: Internal Medicine

## 2024-02-26 ENCOUNTER — Ambulatory Visit

## 2024-02-26 ENCOUNTER — Ambulatory Visit: Payer: Self-pay

## 2024-02-26 DIAGNOSIS — R6889 Other general symptoms and signs: Secondary | ICD-10-CM | POA: Diagnosis not present

## 2024-02-26 MED ORDER — OSELTAMIVIR PHOSPHATE 75 MG PO CAPS: 75.0000 mg | ORAL_CAPSULE | Freq: Two times a day (BID) | ORAL | 0 refills | Status: DC

## 2024-02-26 NOTE — Patient Instructions (Signed)
 [name], thank you for joining Freddy Finner, NP for today's virtual visit.  While this provider is not your primary care provider (PCP), if your PCP is located in our provider database this encounter information will be shared with them immediately following your visit.   A [facility] MyChart account gives you access to today's visit and all your visits, tests, and labs performed at [facility] " click here if you don't have a [facility] MyChart account or go to [email or web address]  Consent: (Patient) [name] provided verbal consent for this virtual visit at the beginning of the encounter.  Current Medications:  Current Outpatient Medications:    oseltamivir (TAMIFLU) 75 MG capsule, Take 1 capsule (75 mg total) by mouth 2 (two) times daily for 5 days., Disp: 10 capsule, Rfl: 0   albuterol (VENTOLIN HFA) 108 (90 Base) MCG/ACT inhaler, Inhale 2 puffs into the lungs every 6 (six) hours as needed for wheezing or shortness of breath. , Disp: , Rfl:    blood glucose meter kit and supplies, Use up to four times daily as directed., Disp: 1 each, Rfl: 0   citalopram (CELEXA) 10 MG tablet, Take 1 tablet (10 mg total) by mouth daily., Disp: 30 tablet, Rfl: 3   cyclobenzaprine (FLEXERIL) 10 MG tablet, Take 1 tablet (10 mg total) by mouth 2 (two) times daily as needed for muscle spasms., Disp: 30 tablet, Rfl: 0   doxycycline (VIBRAMYCIN) 100 MG capsule, Take 1 capsule (100 mg total) by mouth 2 (two) times daily., Disp: 14 capsule, Rfl: 0   Dulaglutide (TRULICITY) 4.5 MG/0.5ML SOAJ, Inject 4.5 mg as directed once a week., Disp: 6 mL, Rfl: 1   Empagliflozin-metFORMIN HCl (SYNJARDY) 12.5-500 MG TABS, Take 1 tablet by mouth daily., Disp: 90 tablet, Rfl: 0   furosemide (LASIX) 20 MG tablet, Take 20 mg by mouth 2 (two) times daily as needed for edema., Disp: , Rfl:    gabapentin (NEURONTIN) 600 MG tablet, Take 1 tablet (600 mg total) by mouth 3 (three) times daily., Disp: 90  tablet, Rfl: 2   glucose blood test strip, Test up to 4 times a day, Disp: 100 each, Rfl: 0   hydrOXYzine (VISTARIL) 25 MG capsule, Take 1 capsule (25 mg total) by mouth every 8 (eight) hours as needed for itching., Disp: 30 capsule, Rfl: 0   Lancets (FREESTYLE) lancets, Use to test 4 times daily, Disp: 100 each, Rfl: 0   lisinopril-hydrochlorothiazide (ZESTORETIC) 20-12.5 MG tablet, Take 1 tablet by mouth daily., Disp: 90 tablet, Rfl: 3   lubiprostone (AMITIZA) 8 MCG capsule, Take 1 capsule (8 mcg total) by mouth 2 (two) times daily with a meal., Disp: 180 capsule, Rfl: 3   meclizine (ANTIVERT) 25 MG tablet, Take 1 tablet (25 mg total) by mouth 2 (two) times daily as needed for dizziness., Disp: 30 tablet, Rfl: 0   meloxicam (MOBIC) 7.5 MG tablet, Take 2 tablets (15 mg total) by mouth daily., Disp: 30 tablet, Rfl: 1   meloxicam (MOBIC) 7.5 MG tablet, Take 1 tablet (7.5 mg total) by mouth daily., Disp: 30 tablet, Rfl: 5   mupirocin ointment (BACTROBAN) 2 %, Apply 1 Application topically 2 (two) times daily., Disp: 22 g, Rfl: 0   omeprazole (PRILOSEC) 40 MG capsule, Take 1 capsule (40 mg total) by mouth daily., Disp: 90 capsule, Rfl: 1   ondansetron (ZOFRAN) 4 MG tablet, Take 1 tablet (4 mg total) by mouth daily as needed for nausea or vomiting., Disp: 30 tablet,  Rfl: 1   promethazine (PHENERGAN) 25 MG tablet, TAKE 1 TABLET(25 MG) BY MOUTH EVERY 8 HOURS AS NEEDED FOR NAUSEA OR VOMITING, Disp: 20 tablet, Rfl: 0   simvastatin (ZOCOR) 40 MG tablet, Take 1 tablet (40 mg total) by mouth every evening for cholesterol, Disp: 90 tablet, Rfl: 3   triamcinolone (KENALOG) 0.025 % ointment, Apply 1 Application topically 2 (two) times daily., Disp: 30 g, Rfl: 0   zolpidem (AMBIEN) 5 MG tablet, Take 1 tablet (5 mg total) by mouth at bedtime as needed for sleep., Disp: 30 tablet, Rfl: 3  Current Facility-Administered Medications:    methylPREDNISolone acetate (DEPO-MEDROL) injection 40 mg, 40 mg, Intramuscular,  Once,    Medications ordered in this encounter:  Meds ordered this encounter  Medications   oseltamivir (TAMIFLU) 75 MG capsule    Sig: Take 1 capsule (75 mg total) by mouth 2 (two) times daily for 5 days.    Dispense:  10 capsule    Refill:  0    Supervising Provider:   Merrilee Jansky [[id number]]     *If you need refills on other medications prior to your next appointment, please contact your pharmacy*  Follow-Up: Call back or seek an in-person evaluation if the symptoms worsen or if the condition fails to improve as anticipated.  [facility] [phone]  Other Instructions  - Continue OTC symptomatic management of choice - Take prescribed medications as directed - Push fluids - Rest as needed - Discussed return precautions and when to seek in-person evaluation, sent via AVS as well    If you have been instructed to have an in-person evaluation today at a local Urgent Care facility, please use the link below. It will take you to a list of all of our available [facility], including address, phone number and hours of operation. Please do not delay care.  [facility]  If you or a family member do not have a primary care provider, use the link below to schedule a visit and establish care. When you choose a [facility] primary care physician or advanced practice provider, you gain a long-term partner in health. Find a Primary Care Provider  Learn more about [facility] in-office and virtual care options:  - Get Care Now

## 2024-02-26 NOTE — Telephone Encounter (Signed)
 Chief Complaint: Diarrhea  Symptoms: Nausea, Sore Throat, Congestion  Frequency: Since Yesterday  Pertinent Negatives: Patient denies vomiting,   Disposition: [] ED /[] Urgent Care (no appt availability in office) / [] Appointment(In office/virtual)/ [x]  [facility]/ [] Home Care/ [] Refused Recommended Disposition /[] [facility]/ []  Follow-up with PCP  Additional Notes: [name] is being triaged for acute diarrhea with nausea, congestion, and a sore throat. Recently exposed to persons at her place of employment that are sick, and is looking to be seen today due to her job requiring her to return as soon as possible. No in office availability, scheduled patient for virtual care appointment. Patient agreed to disposition and verbalized understanding.    Reason for Disposition  Abdominal pain  (Exception: Pain clears with each passage of diarrhea stool.)  [1] Recent antibiotic therapy (i.e., within last 2 months) AND [2] diarrhea present > 3 days since antibiotic was stopped  Answer Assessment - Initial Assessment Questions 1. DIARRHEA SEVERITY: "How bad is the diarrhea?" "How many more stools have you had in the past 24 hours than normal?"    - NO DIARRHEA (SCALE 0)   - MILD (SCALE 1-3): Few loose or mushy BMs; increase of 1-3 stools over normal daily number of stools; mild increase in ostomy output.   -  MODERATE (SCALE 4-7): Increase of 4-6 stools daily over normal; moderate increase in ostomy output.   -  SEVERE (SCALE 8-10; OR "WORST POSSIBLE"): Increase of 7 or more stools daily over normal; moderate increase in ostomy output; incontinence.     5  2. ONSET: "When did the diarrhea begin?"      Yesteday  3. BM CONSISTENCY: "How loose or watery is the diarrhea?"      Both  4. VOMITING: "Are you also vomiting?" If Yes, ask: "How many times in the past 24 hours?"      No  5. ABDOMEN PAIN: "Are you having any abdomen pain?" If Yes, ask: "What does it feel like?" (e.g.,  crampy, dull, intermittent, constant)      Middle to the right   6. ABDOMEN PAIN SEVERITY: If present, ask: "How bad is the pain?"  (e.g., Scale 1-10; mild, moderate, or severe)   - MILD (1-3): doesn't interfere with normal activities, abdomen soft and not tender to touch    - MODERATE (4-7): interferes with normal activities or awakens from sleep, abdomen tender to touch    - SEVERE (8-10): excruciating pain, doubled over, unable to do any normal activities       8  7. ORAL INTAKE: If vomiting, "Have you been able to drink liquids?" "How much liquids have you had in the past 24 hours?"     Less intake  8. HYDRATION: "Any signs of dehydration?" (e.g., dry mouth [not just dry lips], too weak to stand, dizziness, new weight loss) "When did you last urinate?"     No  9. EXPOSURE: "Have you traveled to a foreign country recently?" "Have you been exposed to anyone with diarrhea?" "Could you have eaten any food that was spoiled?"     Yes  10. ANTIBIOTIC USE: "Are you taking antibiotics now or have you taken antibiotics in the past 2 months?"       Yes, Monday  11. OTHER SYMPTOMS: "Do you have any other symptoms?" (e.g., fever, blood in stool)       Nasal Congestion, Sore throat  12. PREGNANCY: "Is there any chance you are pregnant?" "When was your last menstrual period?"  No and No  Protocols used: Diarrhea-A-AH

## 2024-02-26 NOTE — Progress Notes (Signed)
 Virtual Visit Consent   [name], you are scheduled for a virtual visit with a [facility] provider today. Just as with appointments in the office, your consent must be obtained to participate. Your consent will be active for this visit and any virtual visit you may have with one of our providers in the next 365 days. If you have a MyChart account, a copy of this consent can be sent to you electronically.  As this is a virtual visit, video technology does not allow for your provider to perform a traditional examination. This may limit your provider's ability to fully assess your condition. If your provider identifies any concerns that need to be evaluated in person or the need to arrange testing (such as labs, EKG, etc.), we will make arrangements to do so. Although advances in technology are sophisticated, we cannot ensure that it will always work on either your end or our end. If the connection with a video visit is poor, the visit may have to be switched to a telephone visit. With either a video or telephone visit, we are not always able to ensure that we have a secure connection.  By engaging in this virtual visit, you consent to the provision of healthcare and authorize for your insurance to be billed (if applicable) for the services provided during this visit. Depending on your insurance coverage, you may receive a charge related to this service.  I need to obtain your verbal consent now. Are you willing to proceed with your visit today? [name] has provided verbal consent on 02/26/2024 for a virtual visit (video or telephone). Freddy Finner, NP  Date: 02/26/2024 10:38 AM   Virtual Visit via Video Note   I, Freddy Finner, connected with  [name]  ([id number], [birth year]) on 02/26/24 at 10:45 AM EDT by a video-enabled telemedicine application and verified that I am speaking with the correct person using two identifiers.  Location: Patient: Virtual Visit Location Patient:  Home Provider: Virtual Visit Location Provider: Home Office   I discussed the limitations of evaluation and management by telemedicine and the availability of in person appointments. The patient expressed understanding and agreed to proceed.    History of Present Illness: [name] is a 58 y.o. 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)  COVID test: neg home test  Problems:  Patient Active Problem List   Diagnosis Date Noted   Right-sided chest wall pain 02/14/2024   Breast pain 02/05/2024   Mass of upper outer quadrant of right breast 02/05/2024   Encounter for well woman exam with routine gynecological exam 02/05/2024   Family history of breast cancer in first degree relative 02/05/2024   Rash and nonspecific skin eruption 01/13/2024   Special screening for malignant neoplasms, colon 01/03/2024   Skin infection 12/27/2023   Chronic left flank pain 10/18/2023   B12 deficiency 06/17/2023   Long-term (current) use of injectable non-insulin antidiabetic drugs 05/30/2023   Epidermoid cyst of skin of scalp 04/15/2023   Cervical spinal stenosis 01/30/2023   Nodule of finger of right hand 01/22/2023   Acute  idiopathic gout 01/01/2023   Fecal incontinence 01/01/2023   Vertigo 08/09/2022   Chronic mucoid otitis media of left ear 05/16/2022   Primary insomnia 05/09/2022   ASCUS of cervix with negative high risk HPV 01/16/2022   Spondylolisthesis of lumbar region 12/05/2021   Spinal stenosis of lumbar region with neurogenic claudication 10/25/2021   Chronic bilateral low back pain with left-sided  sciatica 10/18/2021   Change in bowel habits 01/18/2021   Mid back pain on right side 10/31/2020   GERD (gastroesophageal reflux disease) 03/09/2020   Other intervertebral disc degeneration, lumbar region 05/24/2019   OSA (obstructive sleep apnea) 12/08/2014   Depression 07/15/2013   Dyspnea 11/12/2012   Headache 10/14/2012   Neuropathy 10/14/2012   Essential hypertension, benign 05/10/2012   Diabetes mellitus (HCC) 09/29/2008   Mixed hyperlipidemia 09/29/2008   Class 2 severe obesity due to excess calories with serious comorbidity and body mass index (BMI) of 38.0 to 38.9 in adult [facility]) 08/31/2008   Recurrent nephrolithiasis 08/31/2008    Allergies:  Allergies  Allergen Reactions   Ciprofloxacin Hives and Swelling   Penicillins Anaphylaxis and Rash   Codeine Other (See Comments)    had hallicunations   Morphine Nausea And Vomiting and Other (See Comments)    recieved in ED due to [facility] and had multple doses - gave visual hallucinations and vomitting.   Sulfonamide Derivatives Other (See Comments)    REACTION: Unsure - childhood allergy   Vancomycin Itching   Medications:  Current Outpatient Medications:    albuterol (VENTOLIN HFA) 108 (90 Base) MCG/ACT inhaler, Inhale 2 puffs into the lungs every 6 (six) hours as needed for wheezing or shortness of breath. , Disp: , Rfl:    blood glucose meter kit and supplies, Use up to four times daily as directed., Disp: 1 each, Rfl: 0   citalopram (CELEXA) 10 MG tablet, Take 1 tablet (10 mg total) by mouth daily., Disp: 30 tablet, Rfl: 3   cyclobenzaprine (FLEXERIL) 10 MG tablet, Take 1 tablet (10 mg total) by mouth 2 (two) times daily as needed for muscle spasms., Disp: 30 tablet, Rfl: 0   doxycycline (VIBRAMYCIN) 100 MG capsule, Take 1 capsule (100 mg total) by mouth 2 (two) times daily., Disp: 14 capsule, Rfl: 0   Dulaglutide (TRULICITY) 4.5 MG/0.5ML SOAJ, Inject 4.5 mg as directed once a week., Disp: 6 mL, Rfl: 1    Empagliflozin-metFORMIN HCl (SYNJARDY) 12.5-500 MG TABS, Take 1 tablet by mouth daily., Disp: 90 tablet, Rfl: 0   furosemide (LASIX) 20 MG tablet, Take 20 mg by mouth 2 (two) times daily as needed for edema., Disp: , Rfl:    gabapentin (NEURONTIN) 600 MG tablet, Take 1 tablet (600 mg total) by mouth 3 (three) times daily., Disp: 90 tablet, Rfl: 2   glucose blood test strip, Test up to 4 times a day, Disp: 100 each, Rfl: 0   hydrOXYzine (VISTARIL) 25 MG capsule, Take 1 capsule (25 mg total) by mouth every 8 (eight) hours as needed for itching., Disp: 30 capsule, Rfl: 0   Lancets (FREESTYLE) lancets, Use to test 4 times daily, Disp: 100 each, Rfl: 0   lisinopril-hydrochlorothiazide (ZESTORETIC) 20-12.5 MG tablet, Take 1 tablet by mouth daily., Disp: 90 tablet, Rfl: 3   lubiprostone (AMITIZA) 8 MCG capsule, Take 1 capsule (8 mcg total) by mouth 2 (two) times daily with a meal., Disp: 180 capsule, Rfl: 3   meclizine (ANTIVERT) 25 MG tablet, Take 1 tablet (25 mg total) by mouth 2 (two) times  daily as needed for dizziness., Disp: 30 tablet, Rfl: 0   meloxicam (MOBIC) 7.5 MG tablet, Take 2 tablets (15 mg total) by mouth daily., Disp: 30 tablet, Rfl: 1   meloxicam (MOBIC) 7.5 MG tablet, Take 1 tablet (7.5 mg total) by mouth daily., Disp: 30 tablet, Rfl: 5   mupirocin ointment (BACTROBAN) 2 %, Apply 1 Application topically 2 (two) times daily., Disp: 22 g, Rfl: 0   omeprazole (PRILOSEC) 40 MG capsule, Take 1 capsule (40 mg total) by mouth daily., Disp: 90 capsule, Rfl: 1   ondansetron (ZOFRAN) 4 MG tablet, Take 1 tablet (4 mg total) by mouth daily as needed for nausea or vomiting., Disp: 30 tablet, Rfl: 1   promethazine (PHENERGAN) 25 MG tablet, TAKE 1 TABLET(25 MG) BY MOUTH EVERY 8 HOURS AS NEEDED FOR NAUSEA OR VOMITING, Disp: 20 tablet, Rfl: 0   simvastatin (ZOCOR) 40 MG tablet, Take 1 tablet (40 mg total) by mouth every evening for cholesterol, Disp: 90 tablet, Rfl: 3   triamcinolone (KENALOG) 0.025 %  ointment, Apply 1 Application topically 2 (two) times daily., Disp: 30 g, Rfl: 0   zolpidem (AMBIEN) 5 MG tablet, Take 1 tablet (5 mg total) by mouth at bedtime as needed for sleep., Disp: 30 tablet, Rfl: 3  Current Facility-Administered Medications:    methylPREDNISolone acetate (DEPO-MEDROL) injection 40 mg, 40 mg, Intramuscular, Once,   Observations/Objective: Patient is well-developed, well-nourished in no acute distress.  Resting comfortably  at home.  Head is normocephalic, atraumatic.  No labored breathing.  Speech is clear and coherent with logical content.  Patient is alert and oriented at baseline.    Assessment and Plan:  1. Flu-like symptoms (Primary)  - oseltamivir (TAMIFLU) 75 MG capsule; Take 1 capsule (75 mg total) by mouth 2 (two) times daily for 5 days.  Dispense: 10 capsule; Refill: 0   - Continue OTC symptomatic management of choice - Take prescribed medications as directed - Push fluids - Rest as needed - Discussed return precautions and when to seek in-person evaluation, sent via AVS as well  -work note provider, follow up with PCP if you need extension on this  Reviewed side effects, risks and benefits of medication.    Patient acknowledged agreement and understanding of the plan.   Past Medical, Surgical, Social History, Allergies, and Medications have been Reviewed.    Follow Up Instructions: I discussed the assessment and treatment plan with the patient. The patient was provided an opportunity to ask questions and all were answered. The patient agreed with the plan and demonstrated an understanding of the instructions.  A copy of instructions were sent to the patient via MyChart unless otherwise noted below.     The patient was advised to call back or seek an in-person evaluation if the symptoms worsen or if the condition fails to improve as anticipated.    Freddy Finner, NP

## 2024-02-27 ENCOUNTER — Encounter: Payer: Self-pay | Admitting: Family Medicine

## 2024-02-27 ENCOUNTER — Telehealth: Admitting: Family Medicine

## 2024-02-27 DIAGNOSIS — B349 Viral infection, unspecified: Secondary | ICD-10-CM

## 2024-02-27 DIAGNOSIS — R110 Nausea: Secondary | ICD-10-CM | POA: Diagnosis not present

## 2024-02-27 MED ORDER — PROMETHAZINE-DM 6.25-15 MG/5ML PO SYRP: 5.0000 mL | ORAL_SOLUTION | Freq: Four times a day (QID) | ORAL | 0 refills | Status: DC | PRN

## 2024-02-27 MED ORDER — ONDANSETRON HCL 4 MG PO TABS: 4.0000 mg | ORAL_TABLET | Freq: Every day | ORAL | 1 refills | Status: DC | PRN

## 2024-02-27 NOTE — Progress Notes (Signed)
 Virtual Visit via Video Note  I connected with [name] on 02/27/24 at  1:00 PM EDT by a video enabled telemedicine application and verified that I am speaking with the correct person using two identifiers.  Patient Location: Home Provider Location: Office/Clinic  I discussed the limitations, risks, security, and privacy concerns of performing an evaluation and management service by video and the availability of in person appointments. I also discussed with the patient that there may be a patient responsible charge related to this service. The patient expressed understanding and agreed to proceed.  Subjective: PCP: Anabel Halon, MD  Chief Complaint  Patient presents with   Chills    Low grade fever, nausea, diarrhea, nasal congestion, some coughing and awful pressure in her head. Started feeling bad Monday. Works at [employer] and everyone either has flu or norovirus. She is already taking tamiflu she did a e visit through [facility]    Patient complains of  cough. Patient describes symptoms of chills, cough, fatigue, headache, malaise, myalgias, sore throat, sputum production, and sweats. Symptoms began a few days ago and are unchanged since that time. Patient denies dyspnea or nausea  Treatment thus far includes Tamiflu started yesterday,OTC analgesics/antipyretics: somewhat effective Past pulmonary history is significant for no history of pneumonia or bronchitis.     ROS: Per HPI  Current Outpatient Medications:    albuterol (VENTOLIN HFA) 108 (90 Base) MCG/ACT inhaler, Inhale 2 puffs into the lungs every 6 (six) hours as needed for wheezing or shortness of breath. , Disp: , Rfl:    blood glucose meter kit and supplies, Use up to four times daily as directed., Disp: 1 each, Rfl: 0   citalopram (CELEXA) 10 MG tablet, Take 1 tablet (10 mg total) by mouth daily., Disp: 30 tablet, Rfl: 3   cyclobenzaprine (FLEXERIL) 10 MG tablet, Take 1 tablet (10 mg total) by mouth 2 (two) times  daily as needed for muscle spasms., Disp: 30 tablet, Rfl: 0   doxycycline (VIBRAMYCIN) 100 MG capsule, Take 1 capsule (100 mg total) by mouth 2 (two) times daily., Disp: 14 capsule, Rfl: 0   Dulaglutide (TRULICITY) 4.5 MG/0.5ML SOAJ, Inject 4.5 mg as directed once a week., Disp: 6 mL, Rfl: 1   Empagliflozin-metFORMIN HCl (SYNJARDY) 12.5-500 MG TABS, Take 1 tablet by mouth daily., Disp: 90 tablet, Rfl: 0   furosemide (LASIX) 20 MG tablet, Take 20 mg by mouth 2 (two) times daily as needed for edema., Disp: , Rfl:    gabapentin (NEURONTIN) 600 MG tablet, Take 1 tablet (600 mg total) by mouth 3 (three) times daily., Disp: 90 tablet, Rfl: 2   glucose blood test strip, Test up to 4 times a day, Disp: 100 each, Rfl: 0   hydrOXYzine (VISTARIL) 25 MG capsule, Take 1 capsule (25 mg total) by mouth every 8 (eight) hours as needed for itching., Disp: 30 capsule, Rfl: 0   Lancets (FREESTYLE) lancets, Use to test 4 times daily, Disp: 100 each, Rfl: 0   lisinopril-hydrochlorothiazide (ZESTORETIC) 20-12.5 MG tablet, Take 1 tablet by mouth daily., Disp: 90 tablet, Rfl: 3   lubiprostone (AMITIZA) 8 MCG capsule, Take 1 capsule (8 mcg total) by mouth 2 (two) times daily with a meal., Disp: 180 capsule, Rfl: 3   meclizine (ANTIVERT) 25 MG tablet, Take 1 tablet (25 mg total) by mouth 2 (two) times daily as needed for dizziness., Disp: 30 tablet, Rfl: 0   meloxicam (MOBIC) 7.5 MG tablet, Take 2 tablets (15 mg total)  by mouth daily., Disp: 30 tablet, Rfl: 1   meloxicam (MOBIC) 7.5 MG tablet, Take 1 tablet (7.5 mg total) by mouth daily., Disp: 30 tablet, Rfl: 5   mupirocin ointment (BACTROBAN) 2 %, Apply 1 Application topically 2 (two) times daily., Disp: 22 g, Rfl: 0   omeprazole (PRILOSEC) 40 MG capsule, Take 1 capsule (40 mg total) by mouth daily., Disp: 90 capsule, Rfl: 1   oseltamivir (TAMIFLU) 75 MG capsule, Take 1 capsule (75 mg total) by mouth 2 (two) times daily for 5 days., Disp: 10 capsule, Rfl: 0   promethazine  (PHENERGAN) 25 MG tablet, TAKE 1 TABLET(25 MG) BY MOUTH EVERY 8 HOURS AS NEEDED FOR NAUSEA OR VOMITING, Disp: 20 tablet, Rfl: 0   promethazine-dextromethorphan (PROMETHAZINE-DM) 6.25-15 MG/5ML syrup, Take 5 mLs by mouth 4 (four) times daily as needed., Disp: 118 mL, Rfl: 0   simvastatin (ZOCOR) 40 MG tablet, Take 1 tablet (40 mg total) by mouth every evening for cholesterol, Disp: 90 tablet, Rfl: 3   triamcinolone (KENALOG) 0.025 % ointment, Apply 1 Application topically 2 (two) times daily., Disp: 30 g, Rfl: 0   zolpidem (AMBIEN) 5 MG tablet, Take 1 tablet (5 mg total) by mouth at bedtime as needed for sleep., Disp: 30 tablet, Rfl: 3   ondansetron (ZOFRAN) 4 MG tablet, Take 1 tablet (4 mg total) by mouth daily as needed for nausea or vomiting., Disp: 20 tablet, Rfl: 1  Current Facility-Administered Medications:    methylPREDNISolone acetate (DEPO-MEDROL) injection 40 mg, 40 mg, Intramuscular, Once,   Observations/Objective: There were no vitals filed for this visit. Physical Exam Patient is alert and no acute distress noted.   Assessment and Plan: Viral illness Assessment & Plan: Advise to complete tamiflu 75 mg course promethazine syrup PRN,  Advise patient to rest to support your body's recovery. Stay hydrated by drinking water, tea, or broth. Using a humidifier can help soothe throat irritation and ease nasal congestion. For fever or pain, acetaminophen (Tylenol) is recommended. To relieve other symptoms, try saline nasal sprays, throat lozenges, or gargling with saltwater. Focus on eating light, healthy meals like fruits and vegetables to keep your strength up. Practice good hygiene by washing your hands frequently and covering your mouth when coughing or sneezing.Follow-up for worsening or persistent symptoms. Patient verbalizes understanding regarding plan of care and all questions answered    Orders: -     Promethazine-DM; Take 5 mLs by mouth 4 (four) times daily as needed.  Dispense:  118 mL; Refill: 0  Nausea -     Ondansetron HCl; Take 1 tablet (4 mg total) by mouth daily as needed for nausea or vomiting.  Dispense: 20 tablet; Refill: 1    Follow Up Instructions: No follow-ups on file.   I discussed the assessment and treatment plan with the patient. The patient was provided an opportunity to ask questions, and all were answered. The patient agreed with the plan and demonstrated an understanding of the instructions.   The patient was advised to call back or seek an in-person evaluation if the symptoms worsen or if the condition fails to improve as anticipated.  The above assessment and management plan was discussed with the patient. The patient verbalized understanding of and has agreed to the management plan.   Cruzita Lederer Newman Nip, FNP

## 2024-02-27 NOTE — Assessment & Plan Note (Signed)
 Advise to complete tamiflu 75 mg course promethazine syrup PRN,  Advise patient to rest to support your body's recovery. Stay hydrated by drinking water, tea, or broth. Using a humidifier can help soothe throat irritation and ease nasal congestion. For fever or pain, acetaminophen (Tylenol) is recommended. To relieve other symptoms, try saline nasal sprays, throat lozenges, or gargling with saltwater. Focus on eating light, healthy meals like fruits and vegetables to keep your strength up. Practice good hygiene by washing your hands frequently and covering your mouth when coughing or sneezing.Follow-up for worsening or persistent symptoms. Patient verbalizes understanding regarding plan of care and all questions answered

## 2024-02-28 ENCOUNTER — Ambulatory Visit

## 2024-02-28 ENCOUNTER — Other Ambulatory Visit: Payer: Self-pay | Admitting: Internal Medicine

## 2024-02-28 MED ORDER — ALBUTEROL SULFATE HFA 108 (90 BASE) MCG/ACT IN AERS: 2.0000 | INHALATION_SPRAY | Freq: Four times a day (QID) | RESPIRATORY_TRACT | 2 refills | Status: AC | PRN

## 2024-02-28 NOTE — Telephone Encounter (Signed)
 Copied from CRM [id number]. Topic: Clinical - Medication Refill >> Feb 28, 2024 12:15 PM Alessandra Bevels wrote: Most Recent Primary Care Visit:  Provider: [facility]  Department: [facility]  Visit Type: ACUTE  Date: 02/27/2024  Medication: albuterol (VENTOLIN HFA) 108 (90 Base) MCG/ACT inhaler [[id number]]  Has the patient contacted their pharmacy? Yes (Agent: If no, request that the patient contact the pharmacy for the refill. If patient does not wish to contact the pharmacy document the reason why and proceed with request.) (Agent: If yes, when and what did the pharmacy advise?)  Is this the correct pharmacy for this prescription? Yes If no, delete pharmacy and type the correct one.  This is the patient's preferred pharmacy:  [id number] - [place], [place] - [address] [place] [address] Phone: [phone] Fax: [phone]     Has the prescription been filled recently? Yes  Is the patient out of the medication? Yes  Has the patient been seen for an appointment in the last year OR does the patient have an upcoming appointment? Yes  Can we respond through MyChart? Yes  Agent: Please be advised that Rx refills may take up to 3 business days. We ask that you follow-up with your pharmacy.

## 2024-03-02 ENCOUNTER — Encounter: Payer: Self-pay | Admitting: Internal Medicine

## 2024-03-02 ENCOUNTER — Ambulatory Visit (INDEPENDENT_AMBULATORY_CARE_PROVIDER_SITE_OTHER): Admitting: Internal Medicine

## 2024-03-02 VITALS — BP 101/62 | HR 85 | Ht <= 58 in | Wt 166.6 lb

## 2024-03-02 DIAGNOSIS — L239 Allergic contact dermatitis, unspecified cause: Secondary | ICD-10-CM | POA: Diagnosis not present

## 2024-03-02 MED ORDER — METHYLPREDNISOLONE ACETATE 80 MG/ML IJ SUSP
80.0000 mg | Freq: Once | INTRAMUSCULAR | Status: AC
  Administered 2024-03-02: 40 mg via INTRAMUSCULAR

## 2024-03-02 MED ORDER — HYDROXYZINE PAMOATE 25 MG PO CAPS: 25.0000 mg | ORAL_CAPSULE | Freq: Three times a day (TID) | ORAL | 0 refills | Status: DC | PRN

## 2024-03-02 NOTE — Assessment & Plan Note (Addendum)
 Rash appears to be allergic in etiology Unknown allergen, but doxycycline related photosensitivity reaction cannot be ruled out as well Depo-Medrol 40 mg IM today Advised to apply Kenalog cream as needed for itching Vistaril as needed for itching Referred to dermatology due to recurrent rash

## 2024-03-02 NOTE — Progress Notes (Signed)
 Acute Office Visit  Subjective:    Patient ID: [name], female    DOB: 03/13/1966, 58 y.o.   MRN: [id number]  Chief Complaint  Patient presents with   Rash    Pt reports bad rash that has come up on her chest and under her chin it hurts burns and itches noticed it on 02/29/24    HPI Patient is in today for c/o a rash around neck and upper chest wall area, which has been recurrent since 01/25.  She has erythematous patches in the lower part of neck and upper chest wall area and has been itching intensely.  She has tried taking Benadryl, but feels drowsy with it.  She was previously given hydroxyzine for it, which had helped better.  She denies wearing any neck jewelry or any new chemical exposure such as soap, shampoo, lotion, detergent or cleaning supplies.  Denies lip swelling or dyspnea currently.  Of note, she was recently given doxycycline for foot infection, but she denies any known allergy to doxycycline and has taken it in the past without any issue.  Past Medical History:  Diagnosis Date   Diabetes mellitus    insulin pump 2013   Dysphagia 03/09/2020   Epidural hematoma (HCC) 12/14/2021   GERD (gastroesophageal reflux disease)    History of kidney stones    Hypercholesteremia    Hypertension    Incarcerated umbilical hernia 03/25/2014   Kidney stone    Kidney stones 08/31/2008   Qualifier: Diagnosis of  By: Erby Pian MD, Cornelius     Neuropathy    Obesity    Palpitations    Shortness of breath    Vertigo     Past Surgical History:  Procedure Laterality Date   BIOPSY  06/23/2020   Procedure: BIOPSY;  Surgeon: Corbin Ade, MD;  [facility]: [facility];  Service: Endoscopy;;   CARDIAC CATHETERIZATION  12/11/2012   normal coronary arteries   CESAREAN SECTION  1994;2000   [facility]   CHOLECYSTECTOMY  1992   COLONOSCOPY WITH PROPOFOL N/A 06/23/2020   large amount of stool in entire colon, prep inadequate.    COLONOSCOPY WITH PROPOFOL N/A 01/03/2024   Procedure:  COLONOSCOPY WITH PROPOFOL;  Surgeon: Dolores Frame, MD;  [facility]: [facility];  Service: Gastroenterology;  Laterality: N/A;  2:00pm;asa 1   CYSTOSCOPY W/ URETERAL STENT PLACEMENT  05/08/2012   Procedure: CYSTOSCOPY WITH RETROGRADE PYELOGRAM/URETERAL STENT PLACEMENT;  Surgeon: Ky Barban, MD;  Location: [facility];  Service: Urology;  Laterality: Right;   CYSTOSCOPY WITH RETROGRADE PYELOGRAM, URETEROSCOPY AND STENT PLACEMENT Right 05/04/2020   Procedure: CYSTOSCOPY WITH RIGHT RETROGRADE PYELOGRAM, RIGHT URETEROSCOPY AND RIGHT URETERAL STENT EXCHANGE;  Surgeon: Malen Gauze, MD;  Location: [facility];  Service: Urology;  Laterality: Right;   CYSTOSCOPY WITH RETROGRADE PYELOGRAM, URETEROSCOPY AND STENT PLACEMENT Left 08/08/2020   Procedure: CYSTOSCOPY WITH RETROGRADE PYELOGRAM, URETEROSCOPY WITH LASER  AND STENT PLACEMENT;  Surgeon: Malen Gauze, MD;  Location: [facility];  Service: Urology;  Laterality: Left;   CYSTOSCOPY WITH RETROGRADE PYELOGRAM, URETEROSCOPY AND STENT PLACEMENT Right 11/08/2020   Procedure: CYSTOSCOPY WITH RIGHT RETROGRADE PYELOGRAM, RIGHT URETEROSCOPY AND STENT PLACEMENT;  Surgeon: Malen Gauze, MD;  Location: [facility];  Service: Urology;  Laterality: Right;   CYSTOSCOPY WITH RETROGRADE PYELOGRAM, URETEROSCOPY AND STENT PLACEMENT Left 12/27/2020   Procedure: CYSTOSCOPY WITH LEFT RETROGRADE PYELOGRAM, LEFT URETEROSCOPY AND LEFT URETERAL STENT PLACEMENT;  Surgeon: Malen Gauze, MD;  Location: [facility];  Service: Urology;  Laterality: Left;   CYSTOSCOPY WITH RETROGRADE PYELOGRAM, URETEROSCOPY AND STENT PLACEMENT Bilateral 03/16/2021   Procedure: CYSTOSCOPY WITH BILATERAL  RETROGRADE PYELOGRAM, BILATERAL URETEROSCOPY AND STENT PLACEMENT ON THE LEFT;  Surgeon: Malen Gauze, MD;  Location: [facility];  Service: Urology;  Laterality: Bilateral;   CYSTOSCOPY WITH RETROGRADE PYELOGRAM, URETEROSCOPY AND STENT PLACEMENT Left 06/28/2022   Procedure: CYSTOSCOPY WITH  RETROGRADE PYELOGRAM, URETEROSCOPY AND STENT PLACEMENT;  Surgeon: Malen Gauze, MD;  [facility]: [facility];  Service: Urology;  Laterality: Left;   CYSTOSCOPY WITH RETROGRADE PYELOGRAM, URETEROSCOPY AND STENT PLACEMENT Bilateral 09/27/2022   Procedure: CYSTOSCOPY WITH BILATERAL RETROGRADE PYELOGRAM, URETEROSCOPY AND STENT PLACEMENT;  Surgeon: Malen Gauze, MD;  Location: [facility];  Service: Urology;  Laterality: Bilateral;  pt knows to arrive at 6:00   CYSTOSCOPY WITH STENT PLACEMENT Right 02/25/2020   Procedure: CYSTOSCOPY WITH RIGHT RETROGRADE RIGHT STENT PLACEMENT;  Surgeon: Jerilee Field, MD;  Location: [facility];  Service: Urology;  Laterality: Right;   ESOPHAGOGASTRODUODENOSCOPY (EGD) WITH PROPOFOL N/A 06/23/2020   Normal esophagus, multiple localized erosions were found in gastric antrum s/p biopsy, normal duodenum. Empiric dilation.Reactive gastropathy with erosions, negative H.pylori, no dysplasia.     EXTRACORPOREAL SHOCK WAVE LITHOTRIPSY Right 10/11/2020   Procedure: EXTRACORPOREAL SHOCK WAVE LITHOTRIPSY (ESWL);  Surgeon: Malen Gauze, MD;  Location: [facility];  Service: Urology;  Laterality: Right;   FOOT SURGERY Right March, 2013   [facility]-removal of bone spur   HOLMIUM LASER APPLICATION  05/04/2020   Procedure: HOLMIUM LASER LITHOTRIPSY RIGHT URETERAL CALCULUS;  Surgeon: Malen Gauze, MD;  Location: [facility];  Service: Urology;;   HYSTEROSCOPY WITH THERMACHOICE  04/25/2012   Procedure: HYSTEROSCOPY WITH THERMACHOICE;  Surgeon: Lazaro Arms, MD;  Location: [facility];  Service: Gynecology;  Laterality: N/A;  total therapy time= 11 minutes 42 seconds; 34 ml D5w  in and 34 ml D5w out; temp =87 degrees F   LEFT HEART CATHETERIZATION WITH CORONARY ANGIOGRAM N/A 12/11/2012   Procedure: LEFT HEART CATHETERIZATION WITH CORONARY ANGIOGRAM;  Surgeon: Runell Gess, MD;  Location: [facility];  Service: Cardiovascular;  Laterality: N/A;   LUMBAR WOUND DEBRIDEMENT N/A 12/14/2021    Procedure: LUMBAR HEMATOMA EVACUATION;  Surgeon: Jadene Pierini, MD;  Location: [facility];  Service: Neurosurgery;  Laterality: N/A;   MALONEY DILATION N/A 06/23/2020   Procedure: [name] DILATION;  Surgeon: Corbin Ade, MD;  [facility];  Service: Endoscopy;  Laterality: N/A;   Sinus sergery  1988   [place]   STONE EXTRACTION WITH BASKET Right 11/08/2020   Procedure: STONE EXTRACTION WITH BASKET;  Surgeon: Malen Gauze, MD;  Location: [facility];  Service: Urology;  Laterality: Right;   TRANSFORAMINAL LUMBAR INTERBODY FUSION (TLIF) WITH PEDICLE SCREW FIXATION 1 LEVEL N/A 12/05/2021   Procedure: Open Lumbar five-Sacral one Laminectomy/Transforaminal Lumbar Interbody Fusion/Posterolateral instrumented fusion;  Surgeon: Jadene Pierini, MD;  Location: [facility];  Service: Neurosurgery;  Laterality: N/A;  3C   UMBILICAL HERNIA REPAIR N/A 03/25/2014   Procedure: UMBILICAL HERNIA REPAIR;  Surgeon: Marlane Hatcher, MD;  Location: [facility];  Service: General;  Laterality: N/A;   uterine ablation      Family History  Problem Relation Age of Onset   Breast cancer Mother    Hypertension Mother    Cancer Mother    Diabetes Mother    Stroke Mother    Alzheimer's disease Mother    Diabetes Father    Breast cancer Sister    Cancer  Paternal Uncle    Breast cancer Maternal Grandmother    Breast cancer Paternal Grandmother    Depression Paternal Grandmother    Depression Paternal Grandfather    Heart disease Other    Cancer Other    Diabetes Other    Achalasia Other    Anesthesia problems Neg Hx    Hypotension Neg Hx    Malignant hyperthermia Neg Hx    Pseudochol deficiency Neg Hx    Colon cancer Neg Hx    Colon polyps Neg Hx     Social History   Socioeconomic History   Marital status: Married    Spouse name: Not on file   Number of children: 2   Years of education: college   Highest education level: 12th grade  Occupational History   Occupation: Librarian, academic: Audiological scientist: [employer]   Occupation: Lawyer- at [employer]  Tobacco Use   Smoking status: Never   Smokeless tobacco: Never  Vaping Use   Vaping status: Never Used  Substance and Sexual Activity   Alcohol use: No   Drug use: No   Sexual activity: Yes    Partners: Male    Birth control/protection: Post-menopausal    Comment: ablation  Other Topics Concern   Not on file  Social History Narrative   Regular exercise: walks Caffeine use:    Social Drivers of Corporate investment banker Strain: Low Risk  (02/05/2024)   Overall Financial Resource Strain (CARDIA)    Difficulty of Paying Living Expenses: Not hard at all  Food Insecurity: No Food Insecurity (02/05/2024)   Hunger Vital Sign    Worried About Running Out of Food in the Last Year: Never true    Ran Out of Food in the Last Year: Never true  Transportation Needs: No Transportation Needs (02/05/2024)   PRAPARE - Administrator, Civil Service (Medical): No    Lack of Transportation (Non-Medical): No  Physical Activity: Insufficiently Active (02/05/2024)   Exercise Vital Sign    Days of Exercise per Week: 3 days    Minutes of Exercise per Session: 30 min  Stress: No Stress Concern Present (02/05/2024)   [employer] of Occupational Health - Occupational Stress Questionnaire    Feeling of Stress : Only a little  Recent Concern: Stress - Stress Concern Present (12/27/2023)   [employer] of Occupational Health - Occupational Stress Questionnaire    Feeling of Stress : To some extent  Social Connections: Moderately Integrated (02/05/2024)   Social Connection and Isolation Panel [NHANES]    Frequency of Communication with Friends and Family: More than three times a week    Frequency of Social Gatherings with Friends and Family: Three times a week    Attends Religious Services: More than 4 times per year    Active Member of Clubs or Organizations: No    Attends Tax inspector Meetings: Never    Marital Status: Married  Catering manager Violence: Not At Risk (02/05/2024)   Humiliation, Afraid, Rape, and Kick questionnaire    Fear of Current or Ex-Partner: No    Emotionally Abused: No    Physically Abused: No    Sexually Abused: No    Outpatient Medications Prior to Visit  Medication Sig Dispense Refill   albuterol (VENTOLIN HFA) 108 (90 Base) MCG/ACT inhaler Inhale 2 puffs into the lungs every 6 (six) hours as needed for wheezing or shortness of breath. 6.7 g 2  blood glucose meter kit and supplies Use up to four times daily as directed. 1 each 0   citalopram (CELEXA) 10 MG tablet Take 1 tablet (10 mg total) by mouth daily. 30 tablet 3   cyclobenzaprine (FLEXERIL) 10 MG tablet Take 1 tablet (10 mg total) by mouth 2 (two) times daily as needed for muscle spasms. 30 tablet 0   doxycycline (VIBRAMYCIN) 100 MG capsule Take 1 capsule (100 mg total) by mouth 2 (two) times daily. 14 capsule 0   Dulaglutide (TRULICITY) 4.5 MG/0.5ML SOAJ Inject 4.5 mg as directed once a week. 6 mL 1   Empagliflozin-metFORMIN HCl (SYNJARDY) 12.5-500 MG TABS Take 1 tablet by mouth daily. 90 tablet 0   furosemide (LASIX) 20 MG tablet Take 20 mg by mouth 2 (two) times daily as needed for edema.     gabapentin (NEURONTIN) 600 MG tablet Take 1 tablet (600 mg total) by mouth 3 (three) times daily. 90 tablet 2   glucose blood test strip Test up to 4 times a day 100 each 0   Lancets (FREESTYLE) lancets Use to test 4 times daily 100 each 0   lisinopril-hydrochlorothiazide (ZESTORETIC) 20-12.5 MG tablet Take 1 tablet by mouth daily. 90 tablet 3   lubiprostone (AMITIZA) 8 MCG capsule Take 1 capsule (8 mcg total) by mouth 2 (two) times daily with a meal. 180 capsule 3   meclizine (ANTIVERT) 25 MG tablet Take 1 tablet (25 mg total) by mouth 2 (two) times daily as needed for dizziness. 30 tablet 0   meloxicam (MOBIC) 7.5 MG tablet Take 1 tablet (7.5 mg total) by mouth daily. 30 tablet 5    mupirocin ointment (BACTROBAN) 2 % Apply 1 Application topically 2 (two) times daily. 22 g 0   omeprazole (PRILOSEC) 40 MG capsule Take 1 capsule (40 mg total) by mouth daily. 90 capsule 1   ondansetron (ZOFRAN) 4 MG tablet Take 1 tablet (4 mg total) by mouth daily as needed for nausea or vomiting. 20 tablet 1   promethazine (PHENERGAN) 25 MG tablet TAKE 1 TABLET(25 MG) BY MOUTH EVERY 8 HOURS AS NEEDED FOR NAUSEA OR VOMITING 20 tablet 0   promethazine-dextromethorphan (PROMETHAZINE-DM) 6.25-15 MG/5ML syrup Take 5 mLs by mouth 4 (four) times daily as needed. 118 mL 0   simvastatin (ZOCOR) 40 MG tablet Take 1 tablet (40 mg total) by mouth every evening for cholesterol 90 tablet 3   triamcinolone (KENALOG) 0.025 % ointment Apply 1 Application topically 2 (two) times daily. 30 g 0   zolpidem (AMBIEN) 5 MG tablet Take 1 tablet (5 mg total) by mouth at bedtime as needed for sleep. 30 tablet 3   hydrOXYzine (VISTARIL) 25 MG capsule Take 1 capsule (25 mg total) by mouth every 8 (eight) hours as needed for itching. 30 capsule 0   meloxicam (MOBIC) 7.5 MG tablet Take 2 tablets (15 mg total) by mouth daily. 30 tablet 1   oseltamivir (TAMIFLU) 75 MG capsule Take 1 capsule (75 mg total) by mouth 2 (two) times daily for 5 days. 10 capsule 0   methylPREDNISolone acetate (DEPO-MEDROL) injection 40 mg      No facility-administered medications prior to visit.    Allergies  Allergen Reactions   Ciprofloxacin Hives and Swelling   Penicillins Anaphylaxis and Rash   Codeine Other (See Comments)    had hallicunations   Morphine Nausea And Vomiting and Other (See Comments)    recieved in ED due to [facility] and had multple doses - gave visual hallucinations and  vomitting.   Sulfonamide Derivatives Other (See Comments)    REACTION: Unsure - childhood allergy   Vancomycin Itching    Review of Systems  Constitutional:  Positive for appetite change and fatigue. Negative for chills and fever.  HENT:  Negative  for congestion, sinus pressure, sinus pain and sore throat.   Eyes:  Negative for pain and discharge.  Respiratory:  Negative for cough and shortness of breath.   Cardiovascular:  Negative for chest pain and palpitations.  Gastrointestinal:  Negative for abdominal pain, nausea and vomiting.  Endocrine: Negative for polydipsia and polyuria.  Genitourinary:  Negative for dysuria and hematuria.  Musculoskeletal:  Positive for back pain. Negative for neck pain and neck stiffness.  Skin:  Positive for rash.  Neurological:  Negative for dizziness and weakness.  Psychiatric/Behavioral:  Positive for dysphoric mood and sleep disturbance. Negative for agitation and behavioral problems.        Objective:    Physical Exam Vitals reviewed.  Constitutional:      General: She is not in acute distress.    Appearance: She is obese. She is not diaphoretic.  HENT:     Head: Normocephalic and atraumatic.     Mouth/Throat:     Mouth: Mucous membranes are moist.     Pharynx: No posterior oropharyngeal erythema.  Eyes:     General: No scleral icterus.    Extraocular Movements: Extraocular movements intact.  Cardiovascular:     Rate and Rhythm: Normal rate and regular rhythm.     Heart sounds: Normal heart sounds. No murmur heard. Pulmonary:     Breath sounds: Normal breath sounds. No wheezing or rales.  Musculoskeletal:     Cervical back: Neck supple. No tenderness.     Right lower leg: No edema.     Left lower leg: No edema.  Skin:    General: Skin is warm.     Findings: Rash (Erythematous patches in the lower part of neck and upper chest wall area) present.  Neurological:     General: No focal deficit present.     Mental Status: She is alert and oriented to person, place, and time.  Psychiatric:        Mood and Affect: Mood is anxious.        Behavior: Behavior normal.     BP 101/62   Pulse 85   Ht 4\' 8"  (1.422 m)   Wt 166 lb 9.6 oz (75.6 kg)   LMP 07/16/2012   SpO2 96%   BMI  37.35 kg/m  Wt Readings from Last 3 Encounters:  03/02/24 166 lb 9.6 oz (75.6 kg)  02/05/24 169 lb (76.7 kg)  01/13/24 170 lb 3.2 oz (77.2 kg)        Assessment & Plan:   Problem List Items Addressed This Visit       Musculoskeletal and Integument   Allergic dermatitis - Primary   Rash appears to be allergic in etiology Unknown allergen, but doxycycline related photosensitivity reaction cannot be ruled out as well Depo-Medrol 40 mg IM today Advised to apply Kenalog cream as needed for itching Vistaril as needed for itching Referred to dermatology due to recurrent rash       Relevant Medications   hydrOXYzine (VISTARIL) 25 MG capsule   Other Relevant Orders   Ambulatory referral to Dermatology     Meds ordered this encounter  Medications   hydrOXYzine (VISTARIL) 25 MG capsule    Sig: Take 1 capsule (25 mg total) by mouth every  8 (eight) hours as needed for itching.    Dispense:  30 capsule    Refill:  0   methylPREDNISolone acetate (DEPO-MEDROL) injection 80 mg     Anabel Halon, MD

## 2024-03-02 NOTE — Patient Instructions (Signed)
 Please take Hydroxyzine as prescribed.  Please use Triamcinolone cream over neck area for itching/rash.

## 2024-03-03 ENCOUNTER — Other Ambulatory Visit: Payer: Self-pay | Admitting: Internal Medicine

## 2024-03-03 DIAGNOSIS — M549 Dorsalgia, unspecified: Secondary | ICD-10-CM

## 2024-03-03 DIAGNOSIS — R9389 Abnormal findings on diagnostic imaging of other specified body structures: Secondary | ICD-10-CM

## 2024-03-03 DIAGNOSIS — R0789 Other chest pain: Secondary | ICD-10-CM

## 2024-03-03 NOTE — Telephone Encounter (Signed)
 Spoke to radiology, pcp has reviewed results I have called the pt to inform her of this she will await on a call from radiology for scheduling of CT.

## 2024-03-03 NOTE — Telephone Encounter (Unsigned)
 Copied from CRM [id number]. Topic: Clinical - Lab/Test Results >> Mar 03, 2024 12:40 PM Antwanette L wrote: Reason for CRM: Tiffany from [facility] is calling with the patients results. Please call Tiffany at [phone]

## 2024-03-04 ENCOUNTER — Ambulatory Visit (HOSPITAL_COMMUNITY)
Admission: RE | Admit: 2024-03-04 | Discharge: 2024-03-04 | Disposition: A | Discharge status: Home / Self Care | Source: Ambulatory Visit | Attending: Internal Medicine | Admitting: Internal Medicine

## 2024-03-04 DIAGNOSIS — R079 Chest pain, unspecified: Secondary | ICD-10-CM | POA: Diagnosis not present

## 2024-03-04 DIAGNOSIS — R9389 Abnormal findings on diagnostic imaging of other specified body structures: Secondary | ICD-10-CM | POA: Insufficient documentation

## 2024-03-04 DIAGNOSIS — R0789 Other chest pain: Secondary | ICD-10-CM | POA: Diagnosis not present

## 2024-03-04 DIAGNOSIS — M549 Dorsalgia, unspecified: Secondary | ICD-10-CM | POA: Insufficient documentation

## 2024-03-04 DIAGNOSIS — I700 Atherosclerosis of aorta: Secondary | ICD-10-CM | POA: Diagnosis not present

## 2024-03-05 ENCOUNTER — Encounter: Payer: Self-pay | Admitting: Internal Medicine

## 2024-03-10 ENCOUNTER — Encounter (HOSPITAL_COMMUNITY): Payer: 59

## 2024-03-10 ENCOUNTER — Other Ambulatory Visit (HOSPITAL_COMMUNITY): Payer: 59

## 2024-03-10 ENCOUNTER — Other Ambulatory Visit (HOSPITAL_COMMUNITY): Payer: Self-pay | Admitting: Adult Health

## 2024-03-10 DIAGNOSIS — Z1231 Encounter for screening mammogram for malignant neoplasm of breast: Secondary | ICD-10-CM

## 2024-03-17 ENCOUNTER — Ambulatory Visit (INDEPENDENT_AMBULATORY_CARE_PROVIDER_SITE_OTHER): Payer: 59 | Admitting: Gastroenterology

## 2024-03-17 ENCOUNTER — Encounter (INDEPENDENT_AMBULATORY_CARE_PROVIDER_SITE_OTHER): Payer: Self-pay | Admitting: Gastroenterology

## 2024-03-17 VITALS — BP 105/67 | HR 94 | Temp 98.3°F | Ht <= 58 in | Wt 167.6 lb

## 2024-03-17 DIAGNOSIS — K5900 Constipation, unspecified: Secondary | ICD-10-CM

## 2024-03-17 DIAGNOSIS — K5904 Chronic idiopathic constipation: Secondary | ICD-10-CM

## 2024-03-17 MED ORDER — LUBIPROSTONE 24 MCG PO CAPS: 24.0000 ug | ORAL_CAPSULE | Freq: Two times a day (BID) | ORAL | 3 refills | Status: AC

## 2024-03-17 NOTE — Progress Notes (Unsigned)
 Referring Provider: Anabel Halon, MD Primary Care Physician:  Anabel Halon, MD Primary GI Physician: Dr. Levon Hedger   Chief Complaint  Patient presents with   Follow-up    Patient here today for a follow up,but still has issues with constipation. She says she has not had a bm since last week. She says she tried linzess and this was too expensive. She is currently on amitiza 8 mcg bid.    HPI:   [name] is a 58 y.o. female with past medical history of diabetes, hypertension, hyperlipidemia, obesity   Patient presenting today for:  Follow up of constipation   Last seen January 2025, at that time reported constipation x 6 months, some intermittent diarrhea. Taking colace daily. Having some nausea as well.   Patient recommended to have KUB, start miralax, continue colace, schedule colonoscopy  KUB done 12/12/23 showed large amount of stool in colon, provided 2 day prep   Started on amitiza BID at the end of January   Present:  States she is taking amitiza BID. States she is still having constipation. 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 She notes that when she does not go she will feel nauseated. She wonders if she needs to switch to something else as she has not felt that [place] is helping.   Last TSH WNL in June 2024, has upcoming labs with endocrinologist in may    Last Colonoscopy:- The entire examined colon is normal.                           - Non-bleeding internal hemorrhoids.                           - No specimens collected.  Last EGD:06/23/2020, performed by Dr. Jena Gauss Multiple erosions in the gastric antrum, empiric dilation of the esophagus was performed.  Biopsies of the stomach were negative for H. pylori.  Repeat colonoscopy 10 years    Filed Weights   03/17/24 1518  Weight: 167 lb 9.6 oz (76 kg)     Past Medical History:  Diagnosis  Date   Diabetes mellitus    insulin pump 2013   Dysphagia 03/09/2020   Epidural hematoma (HCC) 12/14/2021   GERD (gastroesophageal reflux disease)    History of kidney stones    Hypercholesteremia    Hypertension    Incarcerated umbilical hernia 03/25/2014   Kidney stone    Kidney stones 08/31/2008   Qualifier: Diagnosis of  By: Erby Pian MD, Cornelius     Neuropathy    Obesity    Palpitations    Shortness of breath    Vertigo     Past Surgical History:  Procedure Laterality Date   BIOPSY  06/23/2020   Procedure: BIOPSY;  Surgeon: Corbin Ade, MD;  [facility];  Service: Endoscopy;;   CARDIAC CATHETERIZATION  12/11/2012   normal coronary arteries   CESAREAN SECTION  1994;2000   [facility]   CHOLECYSTECTOMY  1992   COLONOSCOPY WITH PROPOFOL N/A 06/23/2020   large amount of stool in entire colon, prep inadequate.    COLONOSCOPY WITH PROPOFOL N/A 01/03/2024   Procedure: COLONOSCOPY WITH PROPOFOL;  Surgeon: Dolores Frame, MD;  [facility];  Service: Gastroenterology;  Laterality: N/A;  2:00pm;asa 1  CYSTOSCOPY W/ URETERAL STENT PLACEMENT  05/08/2012   Procedure: CYSTOSCOPY WITH RETROGRADE PYELOGRAM/URETERAL STENT PLACEMENT;  Surgeon: Ky Barban, MD;  Location: [facility];  Service: Urology;  Laterality: Right;   CYSTOSCOPY WITH RETROGRADE PYELOGRAM, URETEROSCOPY AND STENT PLACEMENT Right 05/04/2020   Procedure: CYSTOSCOPY WITH RIGHT RETROGRADE PYELOGRAM, RIGHT URETEROSCOPY AND RIGHT URETERAL STENT EXCHANGE;  Surgeon: Malen Gauze, MD;  Location: [facility];  Service: Urology;  Laterality: Right;   CYSTOSCOPY WITH RETROGRADE PYELOGRAM, URETEROSCOPY AND STENT PLACEMENT Left 08/08/2020   Procedure: CYSTOSCOPY WITH RETROGRADE PYELOGRAM, URETEROSCOPY WITH LASER  AND STENT PLACEMENT;  Surgeon: Malen Gauze, MD;  Location: [facility];  Service: Urology;  Laterality: Left;   CYSTOSCOPY WITH RETROGRADE PYELOGRAM, URETEROSCOPY AND STENT PLACEMENT Right  11/08/2020   Procedure: CYSTOSCOPY WITH RIGHT RETROGRADE PYELOGRAM, RIGHT URETEROSCOPY AND STENT PLACEMENT;  Surgeon: Malen Gauze, MD;  [facility]: [facility];  Service: Urology;  Laterality: Right;   CYSTOSCOPY WITH RETROGRADE PYELOGRAM, URETEROSCOPY AND STENT PLACEMENT Left 12/27/2020   Procedure: CYSTOSCOPY WITH LEFT RETROGRADE PYELOGRAM, LEFT URETEROSCOPY AND LEFT URETERAL STENT PLACEMENT;  Surgeon: Malen Gauze, MD;  [facility]: [facility];  Service: Urology;  Laterality: Left;   CYSTOSCOPY WITH RETROGRADE PYELOGRAM, URETEROSCOPY AND STENT PLACEMENT Bilateral 03/16/2021   Procedure: CYSTOSCOPY WITH BILATERAL  RETROGRADE PYELOGRAM, BILATERAL URETEROSCOPY AND STENT PLACEMENT ON THE LEFT;  Surgeon: Malen Gauze, MD;  [facility]: [facility];  Service: Urology;  Laterality: Bilateral;   CYSTOSCOPY WITH RETROGRADE PYELOGRAM, URETEROSCOPY AND STENT PLACEMENT Left 06/28/2022   Procedure: CYSTOSCOPY WITH RETROGRADE PYELOGRAM, URETEROSCOPY AND STENT PLACEMENT;  Surgeon: Malen Gauze, MD;  [facility]: [facility];  Service: Urology;  Laterality: Left;   CYSTOSCOPY WITH RETROGRADE PYELOGRAM, URETEROSCOPY AND STENT PLACEMENT Bilateral 09/27/2022   Procedure: CYSTOSCOPY WITH BILATERAL RETROGRADE PYELOGRAM, URETEROSCOPY AND STENT PLACEMENT;  Surgeon: Malen Gauze, MD;  Location: [facility];  Service: Urology;  Laterality: Bilateral;  pt knows to arrive at 6:00   CYSTOSCOPY WITH STENT PLACEMENT Right 02/25/2020   Procedure: CYSTOSCOPY WITH RIGHT RETROGRADE RIGHT STENT PLACEMENT;  Surgeon: Jerilee Field, MD;  Location: [facility];  Service: Urology;  Laterality: Right;   ESOPHAGOGASTRODUODENOSCOPY (EGD) WITH PROPOFOL N/A 06/23/2020   Normal esophagus, multiple localized erosions were found in gastric antrum s/p biopsy, normal duodenum. Empiric dilation.Reactive gastropathy with erosions, negative H.pylori, no dysplasia.     EXTRACORPOREAL SHOCK WAVE LITHOTRIPSY Right 10/11/2020   Procedure: EXTRACORPOREAL SHOCK  WAVE LITHOTRIPSY (ESWL);  Surgeon: Malen Gauze, MD;  Location: [facility];  Service: Urology;  Laterality: Right;   FOOT SURGERY Right March, 2013   [facility]-removal of bone spur   HOLMIUM LASER APPLICATION  05/04/2020   Procedure: HOLMIUM LASER LITHOTRIPSY RIGHT URETERAL CALCULUS;  Surgeon: Malen Gauze, MD;  [facility]: [facility];  Service: Urology;;   HYSTEROSCOPY WITH THERMACHOICE  04/25/2012   Procedure: HYSTEROSCOPY WITH THERMACHOICE;  Surgeon: Lazaro Arms, MD;  Location: [facility];  Service: Gynecology;  Laterality: N/A;  total therapy time= 11 minutes 42 seconds; 34 ml D5w  in and 34 ml D5w out; temp =87 degrees F   LEFT HEART CATHETERIZATION WITH CORONARY ANGIOGRAM N/A 12/11/2012   Procedure: LEFT HEART CATHETERIZATION WITH CORONARY ANGIOGRAM;  Surgeon: Runell Gess, MD;  Location: [facility];  Service: Cardiovascular;  Laterality: N/A;   LUMBAR WOUND DEBRIDEMENT N/A 12/14/2021   Procedure: LUMBAR HEMATOMA EVACUATION;  Surgeon: Jadene Pierini, MD;  Location: [facility];  Service: Neurosurgery;  Laterality: N/A;   MALONEY DILATION N/A 06/23/2020  Procedure: MALONEY DILATION;  Surgeon: Corbin Ade, MD;  [facility];  Service: Endoscopy;  Laterality: N/A;   Sinus sergery  1988   [place]   STONE EXTRACTION WITH BASKET Right 11/08/2020   Procedure: STONE EXTRACTION WITH BASKET;  Surgeon: Malen Gauze, MD;  Location: [facility];  Service: Urology;  Laterality: Right;   TRANSFORAMINAL LUMBAR INTERBODY FUSION (TLIF) WITH PEDICLE SCREW FIXATION 1 LEVEL N/A 12/05/2021   Procedure: Open Lumbar five-Sacral one Laminectomy/Transforaminal Lumbar Interbody Fusion/Posterolateral instrumented fusion;  Surgeon: Jadene Pierini, MD;  Location: [facility];  Service: Neurosurgery;  Laterality: N/A;  3C   UMBILICAL HERNIA REPAIR N/A 03/25/2014   Procedure: UMBILICAL HERNIA REPAIR;  Surgeon: Marlane Hatcher, MD;  Location: [facility];  Service: General;  Laterality: N/A;    uterine ablation      Current Outpatient Medications  Medication Sig Dispense Refill   albuterol (VENTOLIN HFA) 108 (90 Base) MCG/ACT inhaler Inhale 2 puffs into the lungs every 6 (six) hours as needed for wheezing or shortness of breath. 6.7 g 2   blood glucose meter kit and supplies Use up to four times daily as directed. 1 each 0   citalopram (CELEXA) 10 MG tablet Take 1 tablet (10 mg total) by mouth daily. 30 tablet 3   cyclobenzaprine (FLEXERIL) 10 MG tablet Take 1 tablet (10 mg total) by mouth 2 (two) times daily as needed for muscle spasms. 30 tablet 0   doxycycline (VIBRAMYCIN) 100 MG capsule Take 1 capsule (100 mg total) by mouth 2 (two) times daily. 14 capsule 0   Dulaglutide (TRULICITY) 4.5 MG/0.5ML SOAJ Inject 4.5 mg as directed once a week. 6 mL 1   Empagliflozin-metFORMIN HCl (SYNJARDY) 12.5-500 MG TABS Take 1 tablet by mouth daily. 90 tablet 0   furosemide (LASIX) 20 MG tablet Take 20 mg by mouth 2 (two) times daily as needed for edema.     gabapentin (NEURONTIN) 600 MG tablet Take 1 tablet (600 mg total) by mouth 3 (three) times daily. 90 tablet 2   glucose blood test strip Test up to 4 times a day 100 each 0   hydrOXYzine (VISTARIL) 25 MG capsule Take 1 capsule (25 mg total) by mouth every 8 (eight) hours as needed for itching. 30 capsule 0   Lancets (FREESTYLE) lancets Use to test 4 times daily 100 each 0   lisinopril-hydrochlorothiazide (ZESTORETIC) 20-12.5 MG tablet Take 1 tablet by mouth daily. 90 tablet 3   lubiprostone (AMITIZA) 8 MCG capsule Take 1 capsule (8 mcg total) by mouth 2 (two) times daily with a meal. 180 capsule 3   meclizine (ANTIVERT) 25 MG tablet Take 1 tablet (25 mg total) by mouth 2 (two) times daily as needed for dizziness. 30 tablet 0   meloxicam (MOBIC) 7.5 MG tablet Take 1 tablet (7.5 mg total) by mouth daily. 30 tablet 5   mupirocin ointment (BACTROBAN) 2 % Apply 1 Application topically 2 (two) times daily. 22 g 0   omeprazole (PRILOSEC) 40 MG  capsule Take 1 capsule (40 mg total) by mouth daily. 90 capsule 1   ondansetron (ZOFRAN) 4 MG tablet Take 1 tablet (4 mg total) by mouth daily as needed for nausea or vomiting. 20 tablet 1   promethazine (PHENERGAN) 25 MG tablet TAKE 1 TABLET(25 MG) BY MOUTH EVERY 8 HOURS AS NEEDED FOR NAUSEA OR VOMITING 20 tablet 0   promethazine-dextromethorphan (PROMETHAZINE-DM) 6.25-15 MG/5ML syrup Take 5 mLs by mouth 4 (four) times daily as needed. 118 mL  0   simvastatin (ZOCOR) 40 MG tablet Take 1 tablet (40 mg total) by mouth every evening for cholesterol 90 tablet 3   triamcinolone (KENALOG) 0.025 % ointment Apply 1 Application topically 2 (two) times daily. 30 g 0   zolpidem (AMBIEN) 5 MG tablet Take 1 tablet (5 mg total) by mouth at bedtime as needed for sleep. 30 tablet 3   No current facility-administered medications for this visit.    Allergies as of 03/17/2024 - Review Complete 03/02/2024  Allergen Reaction Noted   Ciprofloxacin Hives and Swelling 04/25/2012   Penicillins Anaphylaxis and Rash 08/31/2008   Codeine Other (See Comments) 08/31/2008   Morphine Nausea And Vomiting and Other (See Comments) 08/31/2008   Sulfonamide derivatives Other (See Comments) 08/31/2008   Vancomycin Itching 02/19/2014    Social History   Socioeconomic History   Marital status: Married    Spouse name: Not on file   Number of children: 2   Years of education: college   Highest education level: 12th grade  Occupational History   Occupation: Scientist, research (medical): Audiological scientist: [employer]   Occupation: Lawyer- at [employer]  Tobacco Use   Smoking status: Never   Smokeless tobacco: Never  Vaping Use   Vaping status: Never Used  Substance and Sexual Activity   Alcohol use: No   Drug use: No   Sexual activity: Yes    Partners: Male    Birth control/protection: Post-menopausal    Comment: ablation  Other Topics Concern   Not on file  Social History Narrative   Regular exercise:  walks Caffeine use:    Social Drivers of Corporate investment banker Strain: Low Risk  (02/05/2024)   Overall Financial Resource Strain (CARDIA)    Difficulty of Paying Living Expenses: Not hard at all  Food Insecurity: No Food Insecurity (02/05/2024)   Hunger Vital Sign    Worried About Running Out of Food in the Last Year: Never true    Ran Out of Food in the Last Year: Never true  Transportation Needs: No Transportation Needs (02/05/2024)   PRAPARE - Administrator, Civil Service (Medical): No    Lack of Transportation (Non-Medical): No  Physical Activity: Insufficiently Active (02/05/2024)   Exercise Vital Sign    Days of Exercise per Week: 3 days    Minutes of Exercise per Session: 30 min  Stress: No Stress Concern Present (02/05/2024)   [employer] of Occupational Health - Occupational Stress Questionnaire    Feeling of Stress : Only a little  Recent Concern: Stress - Stress Concern Present (12/27/2023)   [employer] of Occupational Health - Occupational Stress Questionnaire    Feeling of Stress : To some extent  Social Connections: Moderately Integrated (02/05/2024)   Social Connection and Isolation Panel [NHANES]    Frequency of Communication with Friends and Family: More than three times a week    Frequency of Social Gatherings with Friends and Family: Three times a week    Attends Religious Services: More than 4 times per year    Active Member of Clubs or Organizations: No    Attends Banker Meetings: Never    Marital Status: Married    Review of systems General: negative for malaise, night sweats, fever, chills, weight los Neck: Negative for lumps, goiter, pain and significant neck swelling Resp: Negative for cough, wheezing, dyspnea at rest CV: Negative for chest pain, leg swelling, palpitations, orthopnea GI: denies  melena, hematochezia, nausea, vomiting, diarrhea, constipation, dysphagia, odyonophagia, early satiety or unintentional  weight loss.  MSK: Negative for joint pain or swelling, back pain, and muscle pain. Derm: Negative for itching or rash Psych: Denies depression, anxiety, memory loss, confusion. No homicidal or suicidal ideation.  Heme: Negative for prolonged bleeding, bruising easily, and swollen nodes. Endocrine: Negative for cold or heat intolerance, polyuria, polydipsia and goiter. Neuro: negative for tremor, gait imbalance, syncope and seizures. The remainder of the review of systems is noncontributory.  Physical Exam: BP 105/67 (BP Location: Left Arm, Patient Position: Sitting, Cuff Size: Normal)   Pulse 94   Temp 98.3 F (36.8 C) (Temporal)   Ht 4\' 8"  (1.422 m)   Wt 167 lb 9.6 oz (76 kg)   LMP 07/16/2012   BMI 37.58 kg/m  General:   Alert and oriented. No distress noted. Pleasant and cooperative.  Head:  Normocephalic and atraumatic. Eyes:  Conjuctiva clear without scleral icterus. Mouth:  Oral mucosa pink and moist. Good dentition. No lesions. Heart: Normal rate and rhythm, s1 and s2 heart sounds present.  Lungs: Clear lung sounds in all lobes. Respirations equal and unlabored. Abdomen:  +BS, soft, non-tender and non-distended. No rebound or guarding. No HSM or masses noted. Derm: No palmar erythema or jaundice Msk:  Symmetrical without gross deformities. Normal posture. Extremities:  Without edema. Neurologic:  Alert and  oriented x4 Psych:  Alert and cooperative. Normal mood and affect.  Invalid input(s): "6 MONTHS"   ASSESSMENT: [name] is a 58 y.o. female presenting today    PLAN:  -increase amitiza to -do miralax 2 capfuls in 6 oz warm prune juice, repeat every 4 hours til bowels move -Increase water intake, aim for atleast 64 oz per day -Increase fruits, veggies and whole grains, kiwi and prunes are especially good for constipation -pt to make me aware if no improvement in bowels with increased amitiza dose   Follow Up: 3 months   [name] Yodice L. Jeanmarie Hubert, MSN, APRN,  AGNP-C Adult-Gerontology Nurse Practitioner [facility]

## 2024-03-17 NOTE — Patient Instructions (Signed)
 We will increase amitiza to twice daily You can try doing 2 capfuls of miralax in 6 oz of warm prune juice, you can repeat this in about 4 hours if no BM Increase water intake, aim for atleast 64 oz per day Increase fruits, veggies and whole grains, kiwi and prunes are especially good for constipation Please let me know if bowels are not moving on higher dose of amitiza  Follow up 3 months  It was a pleasure to see you today. I want to create trusting relationships with patients and provide genuine, compassionate, and quality care. I truly value your feedback! please be on the lookout for a survey regarding your visit with me today. I appreciate your input about our visit and your time in completing this!    [name] L. Jeanmarie Hubert, MSN, APRN, AGNP-C Adult-Gerontology Nurse Practitioner [facility]

## 2024-03-19 ENCOUNTER — Ambulatory Visit: Admitting: Family Medicine

## 2024-03-19 ENCOUNTER — Encounter: Payer: Self-pay | Admitting: Family Medicine

## 2024-03-19 ENCOUNTER — Ambulatory Visit: Payer: Self-pay

## 2024-03-19 ENCOUNTER — Other Ambulatory Visit: Payer: Self-pay | Admitting: Internal Medicine

## 2024-03-19 VITALS — BP 115/72 | HR 101 | Resp 16 | Ht <= 58 in | Wt 167.0 lb

## 2024-03-19 DIAGNOSIS — T3695XA Adverse effect of unspecified systemic antibiotic, initial encounter: Secondary | ICD-10-CM

## 2024-03-19 DIAGNOSIS — M79641 Pain in right hand: Secondary | ICD-10-CM

## 2024-03-19 DIAGNOSIS — B379 Candidiasis, unspecified: Secondary | ICD-10-CM

## 2024-03-19 DIAGNOSIS — L03113 Cellulitis of right upper limb: Secondary | ICD-10-CM

## 2024-03-19 DIAGNOSIS — L03119 Cellulitis of unspecified part of limb: Secondary | ICD-10-CM

## 2024-03-19 MED ORDER — IBUPROFEN 800 MG PO TABS: 800.0000 mg | ORAL_TABLET | Freq: Three times a day (TID) | ORAL | 0 refills | Status: AC | PRN

## 2024-03-19 MED ORDER — DOXYCYCLINE HYCLATE 100 MG PO CAPS: 100.0000 mg | ORAL_CAPSULE | Freq: Two times a day (BID) | ORAL | 0 refills | Status: DC

## 2024-03-19 NOTE — Telephone Encounter (Signed)
 Copied from CRM [id number]. 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 Patient stated symptoms started this morning. Patient denied fever. Patient is a diabetic and is concerned about infection. Advised patient to see provider in office today. No availability with PCP. Scheduled patient with alternate provider in office this afternoon. Advised patient to call back if symptoms worse. Patient complied.   Reason for Disposition  [1] Looks infected (spreading redness, red streak, pus) AND [2] large red area (> 2 inches or 5 cm, or entire finger)  Answer Assessment - Initial Assessment Questions 1. ONSET: "When did the pain start?"      This morning 2. LOCATION and RADIATION: "Where is the pain located?"  (e.g., fingertip, around nail, joint, entire  finger)      Right pinky finger, entire finger is in pain 3. SEVERITY: "How bad is the pain?" "What does it keep you from doing?"   (Scale 1-10; or mild, moderate, severe)  - MILD (1-3): doesn't interfere with normal activities.   - MODERATE (4-7): interferes with normal activities or awakens from sleep.  - SEVERE (8-10): excruciating pain,  unable to hold a glass of water or bend finger even a little.     Rates pain a 10 4. APPEARANCE: "What does the finger look like?" (e.g., redness, swelling, bruising, pallor)     Swollen, red, warm to the touch 5. WORK OR EXERCISE: "Has there been any recent work or exercise that involved this part (i.e., fingers or hand) of the body?"     N/A 6. CAUSE: "What do you think is causing the pain?"     Possible infection 7. AGGRAVATING FACTORS: "What makes the pain worse?" (e.g., using computer)     Denies 8. OTHER SYMPTOMS: "Do you have any other symptoms?" (e.g., fever, neck pain, numbness)     States finger is hard to bend, denies fever, denies drainage  Protocols used: Finger Pain-A-AH

## 2024-03-19 NOTE — Progress Notes (Signed)
 Acute Office Visit  Subjective:    Patient ID: [name], female    DOB: 11/06/1966, 58 y.o.   MRN: [id number]  Chief Complaint  Patient presents with   Hand Pain    Yesterday her right pinky finger looked fine and when she woke up today it was throbbing and warm and swelled up to what it looks like now. She was told it may be a spider bite     HPI The patient is here today for the above complaints. Upon reviewing the chart, it appears that her PCP had prescribed Doxycycline prior to this appointment, which the patient has not yet started.  Past Medical History:  Diagnosis Date   Diabetes mellitus    insulin pump 2013   Dysphagia 03/09/2020   Epidural hematoma (HCC) 12/14/2021   GERD (gastroesophageal reflux disease)    History of kidney stones    Hypercholesteremia    Hypertension    Incarcerated umbilical hernia 03/25/2014   Kidney stone    Kidney stones 08/31/2008   Qualifier: Diagnosis of  By: Nana Avers MD, Cornelius     Neuropathy    Obesity    Palpitations    Shortness of breath    Vertigo     Past Surgical History:  Procedure Laterality Date   BIOPSY  06/23/2020   Procedure: BIOPSY;  Surgeon: Suzette Espy, MD;  [facility];  Service: Endoscopy;;   CARDIAC CATHETERIZATION  12/11/2012   normal coronary arteries   CESAREAN SECTION  1994;2000   [facility]   CHOLECYSTECTOMY  1992   COLONOSCOPY WITH PROPOFOL N/A 06/23/2020   large amount of stool in entire colon, prep inadequate.    COLONOSCOPY WITH PROPOFOL N/A 01/03/2024   Procedure: COLONOSCOPY WITH PROPOFOL;  Surgeon: Urban Garden, MD;  [facility]: [facility];  Service: Gastroenterology;  Laterality: N/A;  2:00pm;asa 1   CYSTOSCOPY W/ URETERAL STENT PLACEMENT  05/08/2012   Procedure: CYSTOSCOPY WITH RETROGRADE PYELOGRAM/URETERAL STENT PLACEMENT;  Surgeon: Reggie Caper, MD;  Location: [facility];  Service: Urology;  Laterality: Right;   CYSTOSCOPY WITH RETROGRADE PYELOGRAM, URETEROSCOPY  AND STENT PLACEMENT Right 05/04/2020   Procedure: CYSTOSCOPY WITH RIGHT RETROGRADE PYELOGRAM, RIGHT URETEROSCOPY AND RIGHT URETERAL STENT EXCHANGE;  Surgeon: Marco Severs, MD;  Location: [facility];  Service: Urology;  Laterality: Right;   CYSTOSCOPY WITH RETROGRADE PYELOGRAM, URETEROSCOPY AND STENT PLACEMENT Left 08/08/2020   Procedure: CYSTOSCOPY WITH RETROGRADE PYELOGRAM, URETEROSCOPY WITH LASER  AND STENT PLACEMENT;  Surgeon: Marco Severs, MD;  Location: [facility];  Service: Urology;  Laterality: Left;   CYSTOSCOPY WITH RETROGRADE PYELOGRAM, URETEROSCOPY AND STENT PLACEMENT Right 11/08/2020   Procedure: CYSTOSCOPY WITH RIGHT RETROGRADE PYELOGRAM, RIGHT URETEROSCOPY AND STENT PLACEMENT;  Surgeon: Marco Severs, MD;  Location: [facility];  Service: Urology;  Laterality: Right;   CYSTOSCOPY WITH RETROGRADE PYELOGRAM, URETEROSCOPY AND STENT PLACEMENT Left 12/27/2020   Procedure: CYSTOSCOPY WITH LEFT RETROGRADE PYELOGRAM, LEFT URETEROSCOPY AND LEFT URETERAL STENT PLACEMENT;  Surgeon: Marco Severs, MD;  Location: [facility];  Service: Urology;  Laterality: Left;   CYSTOSCOPY WITH RETROGRADE PYELOGRAM, URETEROSCOPY AND STENT PLACEMENT Bilateral 03/16/2021   Procedure: CYSTOSCOPY WITH BILATERAL  RETROGRADE PYELOGRAM, BILATERAL URETEROSCOPY AND STENT PLACEMENT ON THE LEFT;  Surgeon: Marco Severs, MD;  Location: [facility];  Service: Urology;  Laterality: Bilateral;   CYSTOSCOPY WITH RETROGRADE PYELOGRAM, URETEROSCOPY AND STENT PLACEMENT Left 06/28/2022   Procedure: CYSTOSCOPY WITH RETROGRADE PYELOGRAM, URETEROSCOPY AND STENT PLACEMENT;  Surgeon: Marco Severs, MD;  [facility]: [facility];  Service: Urology;  Laterality: Left;   CYSTOSCOPY WITH RETROGRADE PYELOGRAM, URETEROSCOPY AND STENT PLACEMENT Bilateral 09/27/2022   Procedure: CYSTOSCOPY WITH BILATERAL RETROGRADE PYELOGRAM, URETEROSCOPY AND STENT PLACEMENT;  Surgeon: Marco Severs, MD;  Location: [facility];  Service: Urology;  Laterality:  Bilateral;  pt knows to arrive at 6:00   CYSTOSCOPY WITH STENT PLACEMENT Right 02/25/2020   Procedure: CYSTOSCOPY WITH RIGHT RETROGRADE RIGHT STENT PLACEMENT;  Surgeon: Christina Coyer, MD;  Location: [facility];  Service: Urology;  Laterality: Right;   ESOPHAGOGASTRODUODENOSCOPY (EGD) WITH PROPOFOL N/A 06/23/2020   Normal esophagus, multiple localized erosions were found in gastric antrum s/p biopsy, normal duodenum. Empiric dilation.Reactive gastropathy with erosions, negative H.pylori, no dysplasia.     EXTRACORPOREAL SHOCK WAVE LITHOTRIPSY Right 10/11/2020   Procedure: EXTRACORPOREAL SHOCK WAVE LITHOTRIPSY (ESWL);  Surgeon: Marco Severs, MD;  Location: [facility];  Service: Urology;  Laterality: Right;   FOOT SURGERY Right March, 2013   [facility]-removal of bone spur   HOLMIUM LASER APPLICATION  05/04/2020   Procedure: HOLMIUM LASER LITHOTRIPSY RIGHT URETERAL CALCULUS;  Surgeon: Marco Severs, MD;  Location: [facility];  Service: Urology;;   HYSTEROSCOPY WITH THERMACHOICE  04/25/2012   Procedure: HYSTEROSCOPY WITH THERMACHOICE;  Surgeon: Wendelyn Halter, MD;  Location: [facility];  Service: Gynecology;  Laterality: N/A;  total therapy time= 11 minutes 42 seconds; 34 ml D5w  in and 34 ml D5w out; temp =87 degrees F   LEFT HEART CATHETERIZATION WITH CORONARY ANGIOGRAM N/A 12/11/2012   Procedure: LEFT HEART CATHETERIZATION WITH CORONARY ANGIOGRAM;  Surgeon: Avanell Leigh, MD;  Location: [facility];  Service: Cardiovascular;  Laterality: N/A;   LUMBAR WOUND DEBRIDEMENT N/A 12/14/2021   Procedure: LUMBAR HEMATOMA EVACUATION;  Surgeon: Cannon Champion, MD;  Location: [facility];  Service: Neurosurgery;  Laterality: N/A;   MALONEY DILATION N/A 06/23/2020   Procedure: Londa Rival DILATION;  Surgeon: Suzette Espy, MD;  [facility];  Service: Endoscopy;  Laterality: N/A;   Sinus sergery  1988   [place]   STONE EXTRACTION WITH BASKET Right 11/08/2020   Procedure: STONE EXTRACTION WITH  BASKET;  Surgeon: Marco Severs, MD;  Location: [facility];  Service: Urology;  Laterality: Right;   TRANSFORAMINAL LUMBAR INTERBODY FUSION (TLIF) WITH PEDICLE SCREW FIXATION 1 LEVEL N/A 12/05/2021   Procedure: Open Lumbar five-Sacral one Laminectomy/Transforaminal Lumbar Interbody Fusion/Posterolateral instrumented fusion;  Surgeon: Cannon Champion, MD;  Location: [facility];  Service: Neurosurgery;  Laterality: N/A;  3C   UMBILICAL HERNIA REPAIR N/A 03/25/2014   Procedure: UMBILICAL HERNIA REPAIR;  Surgeon: Myrl Askew, MD;  Location: [facility];  Service: General;  Laterality: N/A;   uterine ablation      Family History  Problem Relation Age of Onset   Breast cancer Mother    Hypertension Mother    Cancer Mother    Diabetes Mother    Stroke Mother    Alzheimer's disease Mother    Diabetes Father    Breast cancer Sister    Cancer Paternal Uncle    Breast cancer Maternal Grandmother    Breast cancer Paternal Grandmother    Depression Paternal Grandmother    Depression Paternal Grandfather    Heart disease Other    Cancer Other    Diabetes Other    Achalasia Other    Anesthesia problems Neg Hx    Hypotension Neg Hx    Malignant hyperthermia Neg Hx    Pseudochol deficiency Neg Hx  Colon cancer Neg Hx    Colon polyps Neg Hx     Social History   Socioeconomic History   Marital status: Married    Spouse name: Not on file   Number of children: 2   Years of education: college   Highest education level: 12th grade  Occupational History   Occupation: Scientist, research (medical): Audiological scientist: [employer]   Occupation: Lawyer- at [employer]  Tobacco Use   Smoking status: Never   Smokeless tobacco: Never  Vaping Use   Vaping status: Never Used  Substance and Sexual Activity   Alcohol use: No   Drug use: No   Sexual activity: Yes    Partners: Male    Birth control/protection: Post-menopausal    Comment: ablation  Other Topics Concern   Not on file   Social History Narrative   Regular exercise: walks Caffeine use:    Social Drivers of Corporate investment banker Strain: Low Risk  (02/05/2024)   Overall Financial Resource Strain (CARDIA)    Difficulty of Paying Living Expenses: Not hard at all  Food Insecurity: No Food Insecurity (02/05/2024)   Hunger Vital Sign    Worried About Running Out of Food in the Last Year: Never true    Ran Out of Food in the Last Year: Never true  Transportation Needs: No Transportation Needs (02/05/2024)   PRAPARE - Administrator, Civil Service (Medical): No    Lack of Transportation (Non-Medical): No  Physical Activity: Insufficiently Active (02/05/2024)   Exercise Vital Sign    Days of Exercise per Week: 3 days    Minutes of Exercise per Session: 30 min  Stress: No Stress Concern Present (02/05/2024)   [employer] of Occupational Health - Occupational Stress Questionnaire    Feeling of Stress : Only a little  Recent Concern: Stress - Stress Concern Present (12/27/2023)   [employer] of Occupational Health - Occupational Stress Questionnaire    Feeling of Stress : To some extent  Social Connections: Moderately Integrated (02/05/2024)   Social Connection and Isolation Panel [NHANES]    Frequency of Communication with Friends and Family: More than three times a week    Frequency of Social Gatherings with Friends and Family: Three times a week    Attends Religious Services: More than 4 times per year    Active Member of Clubs or Organizations: No    Attends Banker Meetings: Never    Marital Status: Married  Catering manager Violence: Not At Risk (02/05/2024)   Humiliation, Afraid, Rape, and Kick questionnaire    Fear of Current or Ex-Partner: No    Emotionally Abused: No    Physically Abused: No    Sexually Abused: No    Outpatient Medications Prior to Visit  Medication Sig Dispense Refill   albuterol (VENTOLIN HFA) 108 (90 Base) MCG/ACT inhaler Inhale 2 puffs  into the lungs every 6 (six) hours as needed for wheezing or shortness of breath. 6.7 g 2   blood glucose meter kit and supplies Use up to four times daily as directed. 1 each 0   cyclobenzaprine (FLEXERIL) 10 MG tablet Take 1 tablet (10 mg total) by mouth 2 (two) times daily as needed for muscle spasms. 30 tablet 0   Dulaglutide (TRULICITY) 4.5 MG/0.5ML SOAJ Inject 4.5 mg as directed once a week. 6 mL 1   furosemide (LASIX) 20 MG tablet Take 20 mg by mouth 2 (two) times  daily as needed for edema.     gabapentin (NEURONTIN) 600 MG tablet Take 1 tablet (600 mg total) by mouth 3 (three) times daily. 90 tablet 2   glucose blood test strip Test up to 4 times a day 100 each 0   hydrOXYzine (VISTARIL) 25 MG capsule Take 1 capsule (25 mg total) by mouth every 8 (eight) hours as needed for itching. 30 capsule 0   Lancets (FREESTYLE) lancets Use to test 4 times daily 100 each 0   lisinopril-hydrochlorothiazide (ZESTORETIC) 20-12.5 MG tablet Take 1 tablet by mouth daily. 90 tablet 3   lubiprostone (AMITIZA) 24 MCG capsule Take 1 capsule (24 mcg total) by mouth 2 (two) times daily with a meal. 60 capsule 3   meclizine (ANTIVERT) 25 MG tablet Take 1 tablet (25 mg total) by mouth 2 (two) times daily as needed for dizziness. 30 tablet 0   meloxicam (MOBIC) 7.5 MG tablet Take 1 tablet (7.5 mg total) by mouth daily. 30 tablet 5   omeprazole (PRILOSEC) 40 MG capsule Take 1 capsule (40 mg total) by mouth daily. 90 capsule 1   ondansetron (ZOFRAN) 4 MG tablet Take 1 tablet (4 mg total) by mouth daily as needed for nausea or vomiting. 20 tablet 1   promethazine (PHENERGAN) 25 MG tablet TAKE 1 TABLET(25 MG) BY MOUTH EVERY 8 HOURS AS NEEDED FOR NAUSEA OR VOMITING 20 tablet 0   simvastatin (ZOCOR) 40 MG tablet Take 1 tablet (40 mg total) by mouth every evening for cholesterol 90 tablet 3   zolpidem (AMBIEN) 5 MG tablet Take 1 tablet (5 mg total) by mouth at bedtime as needed for sleep. 30 tablet 3   citalopram (CELEXA)  10 MG tablet Take 1 tablet (10 mg total) by mouth daily. 30 tablet 3   Empagliflozin-metFORMIN HCl (SYNJARDY) 12.5-500 MG TABS Take 1 tablet by mouth daily. 90 tablet 0   doxycycline (VIBRAMYCIN) 100 MG capsule Take 1 capsule (100 mg total) by mouth 2 (two) times daily. (Patient not taking: Reported on 03/19/2024) 14 capsule 0   promethazine-dextromethorphan (PROMETHAZINE-DM) 6.25-15 MG/5ML syrup Take 5 mLs by mouth 4 (four) times daily as needed. 118 mL 0   No facility-administered medications prior to visit.    Allergies  Allergen Reactions   Ciprofloxacin Hives and Swelling   Penicillins Anaphylaxis and Rash   Codeine Other (See Comments)    had hallicunations   Morphine Nausea And Vomiting and Other (See Comments)    recieved in ED due to [facility] and had multple doses - gave visual hallucinations and vomitting.   Sulfonamide Derivatives Other (See Comments)    REACTION: Unsure - childhood allergy   Vancomycin Itching    Review of Systems  Constitutional:  Negative for chills and fever.  Eyes:  Negative for visual disturbance.  Respiratory:  Negative for chest tightness and shortness of breath.   Genitourinary:        Hand pain( pinky finger)  Neurological:  Negative for dizziness and headaches.       Objective:    Physical Exam HENT:     Head: Normocephalic.     Mouth/Throat:     Mouth: Mucous membranes are moist.  Cardiovascular:     Rate and Rhythm: Normal rate.     Heart sounds: Normal heart sounds.  Pulmonary:     Effort: Pulmonary effort is normal.     Breath sounds: Normal breath sounds.  Musculoskeletal:     Right hand: Swelling and tenderness present.  Neurological:  Mental Status: She is alert.     BP 115/72   Pulse (!) 101   Resp 16   Ht 4\' 9"  (1.448 m)   Wt 167 lb (75.8 kg)   LMP 07/16/2012   SpO2 93%   BMI 36.14 kg/m  Wt Readings from Last 3 Encounters:  03/19/24 167 lb (75.8 kg)  03/17/24 167 lb 9.6 oz (76 kg)  03/02/24 166 lb 9.6  oz (75.6 kg)       Assessment & Plan:  Cellulitis of hand, right Assessment & Plan: Encouraged to start taking the doxycycline prescribed by Dr. Lydia Sams today and complete the full course as directed. Take ibuprofen 800 mg every 8 hours for pain and inflammation Encouraged to take over-the-counter Benadryl (diphenhydramine)   Supportive Care: Encouraged to apply warm compresses 3-4 times daily to promote circulation and encourage drainage. Encouraged to use cold therapy as needed to help decrease swelling. Keep the affected hand elevated as much as possible to reduce swelling.  Monitoring: Encouraged to watch for signs of abscess formation, worsening redness, increased swelling, or signs of spreading infection (e.g., red streaking, fever, or chills). Encouraged to seek immediate medical attention if symptoms worsen or if new symptoms develop.  Orders: -     Ibuprofen; Take 1 tablet (800 mg total) by mouth every 8 (eight) hours as needed.  Dispense: 30 tablet; Refill: 0  Antibiotic-induced yeast infection Assessment & Plan: Encouraged to take fluconazole 150 mg as a single dose after completing doxycycline for antibiotic-induced yeast infection.   Orders: -     Fluconazole; Take 1 tablet (150 mg total) by mouth once for 1 dose.  Dispense: 1 tablet; Refill: 0  Note: This chart has been completed using Engineer, civil (consulting) software, and while attempts have been made to ensure accuracy, certain words and phrases may not be transcribed as intended.    Abdimalik Mayorquin, FNP

## 2024-03-19 NOTE — Patient Instructions (Addendum)
 I appreciate the opportunity to provide care to you today!    Follow up:  1 week with pcp  Cellulitis of the Hand / Suspected Spider Bite Please start taking the doxycycline prescribed by Dr. Allena Katz today and complete the full course as directed. Take ibuprofen 800 mg every 8 hours for pain and inflammation, if not contraindicated. You may take over-the-counter Benadryl (diphenhydramine)   Supportive Care: Apply warm compresses 3-4 times daily to promote circulation and encourage drainage. Use cold therapy as needed to help decrease swelling. Keep the affected hand elevated as much as possible to reduce swelling.  Monitoring: Watch for signs of abscess formation, worsening redness, increased swelling, or signs of spreading infection (e.g., red streaking, fever, or chills). Seek immediate medical attention if symptoms worsen or if new symptoms develop.     Please continue to a heart-healthy diet and increase your physical activities. Try to exercise for at least five days a week.    It was a pleasure to see you and I look forward to continuing to work together on your health and well-being. Please do not hesitate to call the office if you need care or have questions about your care.  In case of emergency, please visit the Emergency Department for urgent care, or contact our clinic at [phone] to schedule an appointment. We're here to help you!   Have a wonderful day and week. With Gratitude, Gilmore Laroche MSN, FNP-BC

## 2024-03-20 ENCOUNTER — Other Ambulatory Visit: Payer: Self-pay

## 2024-03-20 ENCOUNTER — Other Ambulatory Visit: Payer: Self-pay | Admitting: Internal Medicine

## 2024-03-20 ENCOUNTER — Other Ambulatory Visit (HOSPITAL_COMMUNITY): Payer: Self-pay

## 2024-03-20 DIAGNOSIS — F3341 Major depressive disorder, recurrent, in partial remission: Secondary | ICD-10-CM

## 2024-03-20 DIAGNOSIS — N200 Calculus of kidney: Secondary | ICD-10-CM

## 2024-03-20 DIAGNOSIS — R109 Unspecified abdominal pain: Secondary | ICD-10-CM

## 2024-03-20 MED ORDER — SYNJARDY 12.5-500 MG PO TABS
1.0000 | ORAL_TABLET | Freq: Every day | ORAL | 0 refills | Status: DC
  Filled 2024-03-20: qty 60, 60d supply, fill #0

## 2024-03-20 MED ORDER — CYCLOBENZAPRINE HCL 10 MG PO TABS
10.0000 mg | ORAL_TABLET | Freq: Three times a day (TID) | ORAL | 0 refills | Status: DC | PRN
Start: 2024-03-20 — End: 2024-04-13
  Filled 2024-03-20: qty 30, 10d supply, fill #0

## 2024-03-20 MED ORDER — CITALOPRAM HYDROBROMIDE 10 MG PO TABS
10.0000 mg | ORAL_TABLET | Freq: Every day | ORAL | 3 refills | Status: DC
  Filled 2024-03-20: qty 30, 30d supply, fill #0
  Filled 2024-04-13: qty 30, 30d supply, fill #1
  Filled 2024-05-14: qty 30, 30d supply, fill #2
  Filled 2024-06-19: qty 30, 30d supply, fill #3

## 2024-03-20 MED ORDER — FLUCONAZOLE 150 MG PO TABS: 150.0000 mg | ORAL_TABLET | Freq: Once | ORAL | 0 refills | Status: AC

## 2024-03-21 DIAGNOSIS — B379 Candidiasis, unspecified: Secondary | ICD-10-CM | POA: Insufficient documentation

## 2024-03-21 DIAGNOSIS — L03113 Cellulitis of right upper limb: Secondary | ICD-10-CM | POA: Insufficient documentation

## 2024-03-21 NOTE — Assessment & Plan Note (Addendum)
 Encouraged to take fluconazole 150 mg as a single dose after completing doxycycline for antibiotic-induced yeast infection.

## 2024-03-21 NOTE — Assessment & Plan Note (Signed)
 Encouraged to start taking the doxycycline prescribed by Dr. Lydia Sams today and complete the full course as directed. Take ibuprofen 800 mg every 8 hours for pain and inflammation Encouraged to take over-the-counter Benadryl (diphenhydramine)   Supportive Care: Encouraged to apply warm compresses 3-4 times daily to promote circulation and encourage drainage. Encouraged to use cold therapy as needed to help decrease swelling. Keep the affected hand elevated as much as possible to reduce swelling.  Monitoring: Encouraged to watch for signs of abscess formation, worsening redness, increased swelling, or signs of spreading infection (e.g., red streaking, fever, or chills). Encouraged to seek immediate medical attention if symptoms worsen or if new symptoms develop.

## 2024-03-24 ENCOUNTER — Encounter (HOSPITAL_COMMUNITY): Payer: 59

## 2024-03-24 ENCOUNTER — Other Ambulatory Visit (HOSPITAL_COMMUNITY): Payer: 59

## 2024-03-27 ENCOUNTER — Ambulatory Visit: Payer: 59 | Admitting: Internal Medicine

## 2024-03-30 ENCOUNTER — Ambulatory Visit: Admitting: Internal Medicine

## 2024-03-30 ENCOUNTER — Encounter: Payer: Self-pay | Admitting: Internal Medicine

## 2024-03-30 ENCOUNTER — Other Ambulatory Visit: Payer: Self-pay | Admitting: Internal Medicine

## 2024-03-30 DIAGNOSIS — L02519 Cutaneous abscess of unspecified hand: Secondary | ICD-10-CM

## 2024-04-03 ENCOUNTER — Ambulatory Visit: Payer: Self-pay | Admitting: Family Medicine

## 2024-04-03 ENCOUNTER — Encounter: Payer: Self-pay | Admitting: Family

## 2024-04-03 ENCOUNTER — Ambulatory Visit: Admitting: Family

## 2024-04-03 DIAGNOSIS — L03113 Cellulitis of right upper limb: Secondary | ICD-10-CM | POA: Diagnosis not present

## 2024-04-03 MED ORDER — CLINDAMYCIN HCL 150 MG PO CAPS: 150.0000 mg | ORAL_CAPSULE | Freq: Three times a day (TID) | ORAL | 0 refills | Status: DC

## 2024-04-03 NOTE — Progress Notes (Signed)
 Office Visit Note   Patient: [name]           Date of Birth: Feb 23, 1966           MRN: [id number] Visit Date: 04/03/2024              Requested by: Meldon Sport, MD [address] [place],  [place] [address] PCP: Meldon Sport, MD  Chief Complaint  Patient presents with   Right Hand - Pain      HPI: The patient is a 58 year old woman who presents today for evaluation of right fifth digit swelling and pain.  She reports this began about 2 weeks ago when she felt that she was bit by a spider bite she can recall pain at the bite.  She initially was treated with a course of doxycycline  for 7 days  According to photos on her phone there appears to be about 50% improvement in the redness there is no longer an open area  She continues have significant pain from swelling complains of numbness burning tingling and loss of flexion of the finger  Assessment & Plan: Visit Diagnoses: No diagnosis found.  Plan: completed doxycycline . Allergic to sulfa . Will place on clindamycin   Discussed return precautions she will call or return should she have no improvement over the weekend. recommended warm compresses  Follow-Up Instructions: No follow-ups on file.   Ortho Exam  Patient is alert, oriented, no adenopathy, well-dressed, normal affect, normal respiratory effort. On examination right hand fifth digit with mild erythema type edema there is dry peeling skin appears to be resolving cellulitis continues with tenderness and edema.  There is no open area.  There is no area of fluctuance no abscess  Imaging: No results found. No images are attached to the encounter.  Labs: Lab Results  Component Value Date   HGBA1C 6.5 05/29/2023   HGBA1C 6.2 (H) 09/25/2022   HGBA1C 6.5 06/07/2022   LABURIC 3.9 02/15/2021   LABURIC 5.1 05/25/2020   REPTSTATUS 05/19/2022 FINAL 05/17/2022   CULT MULTIPLE SPECIES PRESENT, SUGGEST RECOLLECTION (A) 05/17/2022   LABORGA STAPHYLOCOCCUS AUREUS  06/23/2012     Lab Results  Component Value Date   ALBUMIN  4.4 05/28/2023   ALBUMIN  3.9 02/06/2023   ALBUMIN  4.2 11/21/2022    Lab Results  Component Value Date   MG 1.8 02/26/2020   MG 2.2 10/11/2007   No results found for: "VD25OH"  No results found for: "PREALBUMIN"    Latest Ref Rng & Units 07/02/2022    7:17 AM 05/17/2022    6:59 AM 12/13/2021   10:47 PM  CBC EXTENDED  WBC 4.0 - 10.5 K/uL 7.8  7.4  13.4   RBC 3.87 - 5.11 MIL/uL 5.14  5.14  3.88   Hemoglobin 12.0 - 15.0 g/dL 78.4  69.6  29.5   HCT 36.0 - 46.0 % 43.9  43.6  33.4   Platelets 150 - 400 K/uL 302  302  501   NEUT# 1.7 - 7.7 K/uL 5.2   9.8   Lymph# 0.7 - 4.0 K/uL 1.9   2.6      There is no height or weight on file to calculate BMI.  Orders:  No orders of the defined types were placed in this encounter.  Meds ordered this encounter  Medications   clindamycin  (CLEOCIN ) 150 MG capsule    Sig: Take 1 capsule (150 mg total) by mouth 3 (three) times daily.    Dispense:  21 capsule  Refill:  0     Procedures: No procedures performed  Clinical Data: No additional findings.  ROS:  All other systems negative, except as noted in the HPI. Review of Systems  Objective: Vital Signs: LMP 07/16/2012   Specialty Comments:  MRI CERVICAL SPINE WITHOUT CONTRAST   TECHNIQUE: Multiplanar, multisequence MR imaging of the cervical spine was performed. No intravenous contrast was administered.   COMPARISON:  Cervical spine radiographs 01/18/2015. Cervical MRI 12/30/2012.   FINDINGS: Alignment: Minimal degenerative anterolisthesis at C5-6 and C6-7. No focal angulation.   Vertebrae: No acute or suspicious osseous findings.   Cord: Normal in signal and caliber.   Posterior Fossa, vertebral arteries, paraspinal tissues: Visualized portions of the posterior fossa appear unremarkable.Bilateral vertebral artery flow voids. No significant paraspinal findings.   Disc levels:   C2-3: The disc appears  normal. Mild right-sided facet hypertrophy. The spinal canal and neural foramina are widely patent.   C3-4: The disc appears normal. Mild right-sided facet hypertrophy contributing to mild right foraminal narrowing. The spinal canal and left foramen are widely patent.   C4-5: Mild disc bulging and mild right-sided facet hypertrophy. No spinal stenosis or nerve root encroachment.   C5-6: Mild loss of disc height with mild disc bulging and uncinate spurring bilaterally. Mild bilateral facet hypertrophy. Minimal left foraminal narrowing. No spinal stenosis or nerve root encroachment.   C6-7: Spondylosis at this level has progressed from the prior examination. There is loss of disc height with disc bulging and uncinate spurring asymmetric to the right. Resulting partial effacement of the CSF surrounding the cord without cord deformity. Mild to moderate right and mild left foraminal narrowing.   C7-T1: Minimal facet hypertrophy. No spinal stenosis or nerve root encroachment.   IMPRESSION: 1. Progressive spondylosis at C6-7 compared with previous MRI from 2014. There is partial effacement of the CSF surrounding the cord without cord deformity. Mild to moderate right and mild left foraminal narrowing. 2. No other significant spinal stenosis or nerve root encroachment. 3. No acute findings.     Electronically Signed   By: Elmon Hagedorn M.D.   On: 02/19/2023 09:03 --------  2019 Lower extremity EMG/NCS no peripheral polyneuropathy  PMFS History: Patient Active Problem List   Diagnosis Date Noted   Cellulitis of hand, right 03/21/2024   Antibiotic-induced yeast infection 03/21/2024   Allergic dermatitis 03/02/2024   Viral illness 02/27/2024   Right-sided chest wall pain 02/14/2024   Breast pain 02/05/2024   Mass of upper outer quadrant of right breast 02/05/2024   Encounter for well woman exam with routine gynecological exam 02/05/2024   Family history of breast cancer in  first degree relative 02/05/2024   Rash and nonspecific skin eruption 01/13/2024   Special screening for malignant neoplasms, colon 01/03/2024   Skin infection 12/27/2023   Chronic left flank pain 10/18/2023   B12 deficiency 06/17/2023   Long-term (current) use of injectable non-insulin  antidiabetic drugs 05/30/2023   Epidermoid cyst of skin of scalp 04/15/2023   Cervical spinal stenosis 01/30/2023   Nodule of finger of right hand 01/22/2023   Acute idiopathic gout 01/01/2023   Fecal incontinence 01/01/2023   Vertigo 08/09/2022   Chronic mucoid otitis media of left ear 05/16/2022   Primary insomnia 05/09/2022   ASCUS of cervix with negative high risk HPV 01/16/2022   Spondylolisthesis of lumbar region 12/05/2021   Spinal stenosis of lumbar region with neurogenic claudication 10/25/2021   Chronic bilateral low back pain with left-sided sciatica 10/18/2021   Change in bowel  habits 01/18/2021   Mid back pain on right side 10/31/2020   GERD (gastroesophageal reflux disease) 03/09/2020   Other intervertebral disc degeneration, lumbar region 05/24/2019   OSA (obstructive sleep apnea) 12/08/2014   Depression 07/15/2013   Dyspnea 11/12/2012   Headache 10/14/2012   Neuropathy 10/14/2012   Essential hypertension, benign 05/10/2012   Diabetes mellitus (HCC) 09/29/2008   Mixed hyperlipidemia 09/29/2008   Class 2 severe obesity due to excess calories with serious comorbidity and body mass index (BMI) of 38.0 to 38.9 in adult [facility]) 08/31/2008   Recurrent nephrolithiasis 08/31/2008   Past Medical History:  Diagnosis Date   Diabetes mellitus    insulin  pump 2013   Dysphagia 03/09/2020   Epidural hematoma (HCC) 12/14/2021   GERD (gastroesophageal reflux disease)    History of kidney stones    Hypercholesteremia    Hypertension    Incarcerated umbilical hernia 03/25/2014   Kidney stone    Kidney stones 08/31/2008   Qualifier: Diagnosis of  By: Nana Avers MD, Cornelius     Neuropathy     Obesity    Palpitations    Shortness of breath    Vertigo     Family History  Problem Relation Age of Onset   Breast cancer Mother    Hypertension Mother    Cancer Mother    Diabetes Mother    Stroke Mother    Alzheimer's disease Mother    Diabetes Father    Breast cancer Sister    Cancer Paternal Uncle    Breast cancer Maternal Grandmother    Breast cancer Paternal Grandmother    Depression Paternal Grandmother    Depression Paternal Grandfather    Heart disease Other    Cancer Other    Diabetes Other    Achalasia Other    Anesthesia problems Neg Hx    Hypotension Neg Hx    Malignant hyperthermia Neg Hx    Pseudochol deficiency Neg Hx    Colon cancer Neg Hx    Colon polyps Neg Hx     Past Surgical History:  Procedure Laterality Date   BIOPSY  06/23/2020   Procedure: BIOPSY;  Surgeon: Suzette Espy, MD;  [facility];  Service: Endoscopy;;   CARDIAC CATHETERIZATION  12/11/2012   normal coronary arteries   CESAREAN SECTION  1994;2000   [facility]   CHOLECYSTECTOMY  1992   COLONOSCOPY WITH PROPOFOL  N/A 06/23/2020   large amount of stool in entire colon, prep inadequate.    COLONOSCOPY WITH PROPOFOL  N/A 01/03/2024   Procedure: COLONOSCOPY WITH PROPOFOL ;  Surgeon: Urban Garden, MD;  [facility];  Service: Gastroenterology;  Laterality: N/A;  2:00pm;asa 1   CYSTOSCOPY W/ URETERAL STENT PLACEMENT  05/08/2012   Procedure: CYSTOSCOPY WITH RETROGRADE PYELOGRAM/URETERAL STENT PLACEMENT;  Surgeon: Reggie Caper, MD;  Location: [facility];  Service: Urology;  Laterality: Right;   CYSTOSCOPY WITH RETROGRADE PYELOGRAM, URETEROSCOPY AND STENT PLACEMENT Right 05/04/2020   Procedure: CYSTOSCOPY WITH RIGHT RETROGRADE PYELOGRAM, RIGHT URETEROSCOPY AND RIGHT URETERAL STENT EXCHANGE;  Surgeon: Marco Severs, MD;  Location: [facility];  Service: Urology;  Laterality: Right;   CYSTOSCOPY WITH RETROGRADE PYELOGRAM, URETEROSCOPY AND STENT PLACEMENT Left 08/08/2020    Procedure: CYSTOSCOPY WITH RETROGRADE PYELOGRAM, URETEROSCOPY WITH LASER  AND STENT PLACEMENT;  Surgeon: Marco Severs, MD;  Location: [facility];  Service: Urology;  Laterality: Left;   CYSTOSCOPY WITH RETROGRADE PYELOGRAM, URETEROSCOPY AND STENT PLACEMENT Right 11/08/2020   Procedure: CYSTOSCOPY WITH RIGHT RETROGRADE PYELOGRAM, RIGHT URETEROSCOPY AND  STENT PLACEMENT;  Surgeon: Marco Severs, MD;  Location: [facility];  Service: Urology;  Laterality: Right;   CYSTOSCOPY WITH RETROGRADE PYELOGRAM, URETEROSCOPY AND STENT PLACEMENT Left 12/27/2020   Procedure: CYSTOSCOPY WITH LEFT RETROGRADE PYELOGRAM, LEFT URETEROSCOPY AND LEFT URETERAL STENT PLACEMENT;  Surgeon: Marco Severs, MD;  [facility]: [facility];  Service: Urology;  Laterality: Left;   CYSTOSCOPY WITH RETROGRADE PYELOGRAM, URETEROSCOPY AND STENT PLACEMENT Bilateral 03/16/2021   Procedure: CYSTOSCOPY WITH BILATERAL  RETROGRADE PYELOGRAM, BILATERAL URETEROSCOPY AND STENT PLACEMENT ON THE LEFT;  Surgeon: Marco Severs, MD;  Location: [facility];  Service: Urology;  Laterality: Bilateral;   CYSTOSCOPY WITH RETROGRADE PYELOGRAM, URETEROSCOPY AND STENT PLACEMENT Left 06/28/2022   Procedure: CYSTOSCOPY WITH RETROGRADE PYELOGRAM, URETEROSCOPY AND STENT PLACEMENT;  Surgeon: Marco Severs, MD;  [facility];  Service: Urology;  Laterality: Left;   CYSTOSCOPY WITH RETROGRADE PYELOGRAM, URETEROSCOPY AND STENT PLACEMENT Bilateral 09/27/2022   Procedure: CYSTOSCOPY WITH BILATERAL RETROGRADE PYELOGRAM, URETEROSCOPY AND STENT PLACEMENT;  Surgeon: Marco Severs, MD;  [facility]: [facility];  Service: Urology;  Laterality: Bilateral;  pt knows to arrive at 6:00   CYSTOSCOPY WITH STENT PLACEMENT Right 02/25/2020   Procedure: CYSTOSCOPY WITH RIGHT RETROGRADE RIGHT STENT PLACEMENT;  Surgeon: Christina Coyer, MD;  Location: [facility];  Service: Urology;  Laterality: Right;   ESOPHAGOGASTRODUODENOSCOPY (EGD) WITH PROPOFOL  N/A 06/23/2020   Normal  esophagus, multiple localized erosions were found in gastric antrum s/p biopsy, normal duodenum. Empiric dilation.Reactive gastropathy with erosions, negative H.pylori, no dysplasia.     EXTRACORPOREAL SHOCK WAVE LITHOTRIPSY Right 10/11/2020   Procedure: EXTRACORPOREAL SHOCK WAVE LITHOTRIPSY (ESWL);  Surgeon: Marco Severs, MD;  Location: [facility];  Service: Urology;  Laterality: Right;   FOOT SURGERY Right March, 2013   [facility]-removal of bone spur   HOLMIUM LASER APPLICATION  05/04/2020   Procedure: HOLMIUM LASER LITHOTRIPSY RIGHT URETERAL CALCULUS;  Surgeon: Marco Severs, MD;  Location: [facility];  Service: Urology;;   HYSTEROSCOPY WITH THERMACHOICE  04/25/2012   Procedure: HYSTEROSCOPY WITH THERMACHOICE;  Surgeon: Wendelyn Halter, MD;  Location: [facility];  Service: Gynecology;  Laterality: N/A;  total therapy time= 11 minutes 42 seconds; 34 ml D5w  in and 34 ml D5w out; temp =87 degrees F   LEFT HEART CATHETERIZATION WITH CORONARY ANGIOGRAM N/A 12/11/2012   Procedure: LEFT HEART CATHETERIZATION WITH CORONARY ANGIOGRAM;  Surgeon: Avanell Leigh, MD;  Location: [facility] CATH LAB;  Service: Cardiovascular;  Laterality: N/A;   LUMBAR WOUND DEBRIDEMENT N/A 12/14/2021   Procedure: LUMBAR HEMATOMA EVACUATION;  Surgeon: Cannon Champion, MD;  Location: [facility];  Service: Neurosurgery;  Laterality: N/A;   MALONEY DILATION N/A 06/23/2020   Procedure: Londa Rival DILATION;  Surgeon: Suzette Espy, MD;  [facility];  Service: Endoscopy;  Laterality: N/A;   Sinus sergery  1988   [place]   STONE EXTRACTION WITH BASKET Right 11/08/2020   Procedure: STONE EXTRACTION WITH BASKET;  Surgeon: Marco Severs, MD;  Location: [facility];  Service: Urology;  Laterality: Right;   TRANSFORAMINAL LUMBAR INTERBODY FUSION (TLIF) WITH PEDICLE SCREW FIXATION 1 LEVEL N/A 12/05/2021   Procedure: Open Lumbar five-Sacral one Laminectomy/Transforaminal Lumbar Interbody Fusion/Posterolateral instrumented fusion;   Surgeon: Cannon Champion, MD;  Location: [facility];  Service: Neurosurgery;  Laterality: N/A;  3C   UMBILICAL HERNIA REPAIR N/A 03/25/2014   Procedure: UMBILICAL HERNIA REPAIR;  Surgeon: Myrl Askew, MD;  Location: [facility];  Service: General;  Laterality: N/A;   uterine ablation  Social History   Occupational History   Occupation: Scientist, research (medical): Audiological scientist: [employer]   Occupation: Lawyer- at [employer]  Tobacco Use   Smoking status: Never   Smokeless tobacco: Never  Vaping Use   Vaping status: Never Used  Substance and Sexual Activity   Alcohol use: No   Drug use: No   Sexual activity: Yes    Partners: Male    Birth control/protection: Post-menopausal    Comment: ablation

## 2024-04-07 ENCOUNTER — Ambulatory Visit (HOSPITAL_COMMUNITY): Payer: 59

## 2024-04-08 ENCOUNTER — Ambulatory Visit (HOSPITAL_COMMUNITY)
Admission: RE | Admit: 2024-04-08 | Discharge: 2024-04-08 | Disposition: A | Discharge status: Home / Self Care | Source: Ambulatory Visit | Attending: Urology | Admitting: Urology

## 2024-04-08 DIAGNOSIS — N200 Calculus of kidney: Secondary | ICD-10-CM | POA: Insufficient documentation

## 2024-04-08 DIAGNOSIS — Z0389 Encounter for observation for other suspected diseases and conditions ruled out: Secondary | ICD-10-CM | POA: Diagnosis not present

## 2024-04-13 ENCOUNTER — Other Ambulatory Visit: Payer: Self-pay | Admitting: "Endocrinology

## 2024-04-13 ENCOUNTER — Other Ambulatory Visit: Payer: Self-pay | Admitting: Internal Medicine

## 2024-04-13 ENCOUNTER — Encounter: Admitting: Orthopedic Surgery

## 2024-04-13 ENCOUNTER — Ambulatory Visit: Payer: 59 | Admitting: "Endocrinology

## 2024-04-13 ENCOUNTER — Other Ambulatory Visit (HOSPITAL_COMMUNITY): Payer: Self-pay

## 2024-04-13 DIAGNOSIS — R109 Unspecified abdominal pain: Secondary | ICD-10-CM

## 2024-04-13 DIAGNOSIS — F5101 Primary insomnia: Secondary | ICD-10-CM

## 2024-04-13 DIAGNOSIS — N200 Calculus of kidney: Secondary | ICD-10-CM

## 2024-04-13 DIAGNOSIS — K219 Gastro-esophageal reflux disease without esophagitis: Secondary | ICD-10-CM

## 2024-04-14 ENCOUNTER — Other Ambulatory Visit: Payer: Self-pay

## 2024-04-14 ENCOUNTER — Other Ambulatory Visit (HOSPITAL_COMMUNITY): Payer: Self-pay

## 2024-04-14 MED ORDER — CYCLOBENZAPRINE HCL 10 MG PO TABS
10.0000 mg | ORAL_TABLET | Freq: Two times a day (BID) | ORAL | 0 refills | Status: DC | PRN
  Filled 2024-04-14 (×2): qty 30, 15d supply, fill #0

## 2024-04-14 MED ORDER — ZOLPIDEM TARTRATE 5 MG PO TABS
5.0000 mg | ORAL_TABLET | Freq: Every evening | ORAL | 3 refills | Status: DC | PRN
  Filled 2024-04-14 – 2024-06-19 (×2): qty 30, 30d supply, fill #0
  Filled 2024-07-24: qty 30, 30d supply, fill #1
  Filled 2024-08-25: qty 30, 30d supply, fill #2
  Filled 2024-09-28: qty 30, 30d supply, fill #0

## 2024-04-14 MED ORDER — GABAPENTIN 600 MG PO TABS
600.0000 mg | ORAL_TABLET | Freq: Three times a day (TID) | ORAL | 2 refills | Status: DC
  Filled 2024-04-14 (×2): qty 90, 30d supply, fill #0
  Filled 2024-05-14: qty 90, 30d supply, fill #1
  Filled 2024-06-19: qty 90, 30d supply, fill #2

## 2024-04-14 MED ORDER — TRULICITY 4.5 MG/0.5ML ~~LOC~~ SOAJ
4.5000 mg | SUBCUTANEOUS | 1 refills | Status: DC
  Filled 2024-04-14: qty 6, 84d supply, fill #0

## 2024-04-14 MED ORDER — OMEPRAZOLE 40 MG PO CPDR
40.0000 mg | DELAYED_RELEASE_CAPSULE | Freq: Every day | ORAL | 1 refills | Status: DC
  Filled 2024-04-14 (×2): qty 90, 90d supply, fill #0
  Filled 2024-07-10: qty 90, 90d supply, fill #1

## 2024-04-14 NOTE — Telephone Encounter (Signed)
 Can you offer her another ash appt please

## 2024-04-15 ENCOUNTER — Other Ambulatory Visit (HOSPITAL_COMMUNITY): Payer: Self-pay

## 2024-04-16 ENCOUNTER — Ambulatory Visit: Payer: 59 | Admitting: Urology

## 2024-05-05 DIAGNOSIS — Z794 Long term (current) use of insulin: Secondary | ICD-10-CM | POA: Diagnosis not present

## 2024-05-05 DIAGNOSIS — E1169 Type 2 diabetes mellitus with other specified complication: Secondary | ICD-10-CM | POA: Diagnosis not present

## 2024-05-06 ENCOUNTER — Other Ambulatory Visit (HOSPITAL_COMMUNITY): Payer: Self-pay

## 2024-05-06 ENCOUNTER — Ambulatory Visit (INDEPENDENT_AMBULATORY_CARE_PROVIDER_SITE_OTHER): Admitting: "Endocrinology

## 2024-05-06 ENCOUNTER — Encounter: Payer: Self-pay | Admitting: "Endocrinology

## 2024-05-06 VITALS — BP 104/66 | HR 84 | Ht <= 58 in | Wt 165.4 lb

## 2024-05-06 DIAGNOSIS — E66812 Obesity, class 2: Secondary | ICD-10-CM | POA: Diagnosis not present

## 2024-05-06 DIAGNOSIS — Z7985 Long-term (current) use of injectable non-insulin antidiabetic drugs: Secondary | ICD-10-CM

## 2024-05-06 DIAGNOSIS — Z6838 Body mass index (BMI) 38.0-38.9, adult: Secondary | ICD-10-CM

## 2024-05-06 DIAGNOSIS — I10 Essential (primary) hypertension: Secondary | ICD-10-CM

## 2024-05-06 DIAGNOSIS — Z794 Long term (current) use of insulin: Secondary | ICD-10-CM

## 2024-05-06 DIAGNOSIS — E1169 Type 2 diabetes mellitus with other specified complication: Secondary | ICD-10-CM | POA: Diagnosis not present

## 2024-05-06 DIAGNOSIS — E782 Mixed hyperlipidemia: Secondary | ICD-10-CM

## 2024-05-06 LAB — LIPID PANEL
Chol/HDL Ratio: 2.5 ratio (ref 0.0–4.4)
Cholesterol, Total: 135 mg/dL (ref 100–199)
HDL: 53 mg/dL (ref >39)
LDL Chol Calc (NIH): 61 mg/dL (ref 0–99)
Triglycerides: 121 mg/dL (ref 0–149)
VLDL Cholesterol Cal: 21 mg/dL (ref 5–40)

## 2024-05-06 LAB — T4, FREE: Free T4: 0.85 ng/dL (ref 0.82–1.77)

## 2024-05-06 LAB — COMPREHENSIVE METABOLIC PANEL WITH GFR
ALT: 21 IU/L (ref 0–32)
AST: 21 IU/L (ref 0–40)
Albumin: 3.9 g/dL (ref 3.8–4.9)
Alkaline Phosphatase: 113 IU/L (ref 44–121)
BUN/Creatinine Ratio: 18 (ref 9–23)
BUN: 14 mg/dL (ref 6–24)
Bilirubin Total: 0.6 mg/dL (ref 0.0–1.2)
CO2: 24 mmol/L (ref 20–29)
Calcium: 9.2 mg/dL (ref 8.7–10.2)
Chloride: 99 mmol/L (ref 96–106)
Creatinine, Ser: 0.78 mg/dL (ref 0.57–1.00)
Globulin, Total: 2.5 g/dL (ref 1.5–4.5)
Glucose: 124 mg/dL — ABNORMAL HIGH (ref 70–99)
Potassium: 4.1 mmol/L (ref 3.5–5.2)
Sodium: 139 mmol/L (ref 134–144)
Total Protein: 6.4 g/dL (ref 6.0–8.5)
eGFR: 89 mL/min/{1.73_m2} (ref >59)

## 2024-05-06 LAB — POCT GLYCOSYLATED HEMOGLOBIN (HGB A1C): HbA1c, POC (controlled diabetic range): 5.7 % (ref 0.0–7.0)

## 2024-05-06 LAB — TSH: TSH: 0.909 u[IU]/mL (ref 0.450–4.500)

## 2024-05-06 MED ORDER — TIRZEPATIDE 2.5 MG/0.5ML ~~LOC~~ SOAJ
2.5000 mg | SUBCUTANEOUS | 1 refills | Status: DC
  Filled 2024-05-06: qty 2, 28d supply, fill #0
  Filled 2024-05-14 – 2024-10-30 (×2): qty 2, 28d supply, fill #1

## 2024-05-06 NOTE — Progress Notes (Signed)
 05/06/2024, 5:04 PM   Endocrinology follow-up note  Subjective:    Patient ID: [name], female    DOB: May 12, 1966.  [name] is being seen in follow up after she was seen in consultation for management of currently uncontrolled symptomatic diabetes requested by  Meldon Sport, MD.   Past Medical History:  Diagnosis Date   Diabetes mellitus    insulin  pump 2013   Dysphagia 03/09/2020   Epidural hematoma (HCC) 12/14/2021   GERD (gastroesophageal reflux disease)    History of kidney stones    Hypercholesteremia    Hypertension    Incarcerated umbilical hernia 03/25/2014   Kidney stone    Kidney stones 08/31/2008   Qualifier: Diagnosis of  By: Nana Avers MD, Cornelius     Neuropathy    Obesity    Palpitations    Shortness of breath    Vertigo     Past Surgical History:  Procedure Laterality Date   BIOPSY  06/23/2020   Procedure: BIOPSY;  Surgeon: Suzette Espy, MD;  [facility];  Service: Endoscopy;;   CARDIAC CATHETERIZATION  12/11/2012   normal coronary arteries   CESAREAN SECTION  1994;2000   [facility]   CHOLECYSTECTOMY  1992   COLONOSCOPY WITH PROPOFOL  N/A 06/23/2020   large amount of stool in entire colon, prep inadequate.    COLONOSCOPY WITH PROPOFOL  N/A 01/03/2024   Procedure: COLONOSCOPY WITH PROPOFOL ;  Surgeon: Urban Garden, MD;  [facility];  Service: Gastroenterology;  Laterality: N/A;  2:00pm;asa 1   CYSTOSCOPY W/ URETERAL STENT PLACEMENT  05/08/2012   Procedure: CYSTOSCOPY WITH RETROGRADE PYELOGRAM/URETERAL STENT PLACEMENT;  Surgeon: Reggie Caper, MD;  Location: [facility];  Service: Urology;  Laterality: Right;   CYSTOSCOPY WITH RETROGRADE PYELOGRAM, URETEROSCOPY AND STENT PLACEMENT Right 05/04/2020   Procedure: CYSTOSCOPY WITH RIGHT RETROGRADE PYELOGRAM, RIGHT URETEROSCOPY AND RIGHT URETERAL STENT EXCHANGE;  Surgeon: Marco Severs, MD;  Location: [facility];  Service: Urology;  Laterality:  Right;   CYSTOSCOPY WITH RETROGRADE PYELOGRAM, URETEROSCOPY AND STENT PLACEMENT Left 08/08/2020   Procedure: CYSTOSCOPY WITH RETROGRADE PYELOGRAM, URETEROSCOPY WITH LASER  AND STENT PLACEMENT;  Surgeon: Marco Severs, MD;  Location: [facility];  Service: Urology;  Laterality: Left;   CYSTOSCOPY WITH RETROGRADE PYELOGRAM, URETEROSCOPY AND STENT PLACEMENT Right 11/08/2020   Procedure: CYSTOSCOPY WITH RIGHT RETROGRADE PYELOGRAM, RIGHT URETEROSCOPY AND STENT PLACEMENT;  Surgeon: Marco Severs, MD;  [facility]: [facility];  Service: Urology;  Laterality: Right;   CYSTOSCOPY WITH RETROGRADE PYELOGRAM, URETEROSCOPY AND STENT PLACEMENT Left 12/27/2020   Procedure: CYSTOSCOPY WITH LEFT RETROGRADE PYELOGRAM, LEFT URETEROSCOPY AND LEFT URETERAL STENT PLACEMENT;  Surgeon: Marco Severs, MD;  [facility]: [facility];  Service: Urology;  Laterality: Left;   CYSTOSCOPY WITH RETROGRADE PYELOGRAM, URETEROSCOPY AND STENT PLACEMENT Bilateral 03/16/2021   Procedure: CYSTOSCOPY WITH BILATERAL  RETROGRADE PYELOGRAM, BILATERAL URETEROSCOPY AND STENT PLACEMENT ON THE LEFT;  Surgeon: Marco Severs, MD;  Location: [facility];  Service: Urology;  Laterality: Bilateral;   CYSTOSCOPY WITH RETROGRADE PYELOGRAM, URETEROSCOPY AND STENT PLACEMENT Left 06/28/2022   Procedure: CYSTOSCOPY WITH RETROGRADE PYELOGRAM, URETEROSCOPY AND STENT PLACEMENT;  Surgeon: Marco Severs, MD;  [facility]: [facility];  Service: Urology;  Laterality: Left;   CYSTOSCOPY WITH RETROGRADE PYELOGRAM, URETEROSCOPY AND STENT PLACEMENT Bilateral 09/27/2022   Procedure: CYSTOSCOPY WITH BILATERAL RETROGRADE PYELOGRAM, URETEROSCOPY AND STENT PLACEMENT;  Surgeon: Marco Severs, MD;  Location: [facility];  Service: Urology;  Laterality: Bilateral;  pt  knows to arrive at 6:00   CYSTOSCOPY WITH STENT PLACEMENT Right 02/25/2020   Procedure: CYSTOSCOPY WITH RIGHT RETROGRADE RIGHT STENT PLACEMENT;  Surgeon: Christina Coyer, MD;  Location: [facility];  Service: Urology;   Laterality: Right;   ESOPHAGOGASTRODUODENOSCOPY (EGD) WITH PROPOFOL  N/A 06/23/2020   Normal esophagus, multiple localized erosions were found in gastric antrum s/p biopsy, normal duodenum. Empiric dilation.Reactive gastropathy with erosions, negative H.pylori, no dysplasia.     EXTRACORPOREAL SHOCK WAVE LITHOTRIPSY Right 10/11/2020   Procedure: EXTRACORPOREAL SHOCK WAVE LITHOTRIPSY (ESWL);  Surgeon: Marco Severs, MD;  Location: [facility];  Service: Urology;  Laterality: Right;   FOOT SURGERY Right March, 2013   [facility]-removal of bone spur   HOLMIUM LASER APPLICATION  05/04/2020   Procedure: HOLMIUM LASER LITHOTRIPSY RIGHT URETERAL CALCULUS;  Surgeon: Marco Severs, MD;  Location: [facility];  Service: Urology;;   HYSTEROSCOPY WITH THERMACHOICE  04/25/2012   Procedure: HYSTEROSCOPY WITH THERMACHOICE;  Surgeon: Wendelyn Halter, MD;  Location: [facility];  Service: Gynecology;  Laterality: N/A;  total therapy time= 11 minutes 42 seconds; 34 ml D5w  in and 34 ml D5w out; temp =87 degrees F   LEFT HEART CATHETERIZATION WITH CORONARY ANGIOGRAM N/A 12/11/2012   Procedure: LEFT HEART CATHETERIZATION WITH CORONARY ANGIOGRAM;  Surgeon: Avanell Leigh, MD;  Location: [facility];  Service: Cardiovascular;  Laterality: N/A;   LUMBAR WOUND DEBRIDEMENT N/A 12/14/2021   Procedure: LUMBAR HEMATOMA EVACUATION;  Surgeon: Cannon Champion, MD;  Location: [facility];  Service: Neurosurgery;  Laterality: N/A;   MALONEY DILATION N/A 06/23/2020   Procedure: Londa Rival DILATION;  Surgeon: Suzette Espy, MD;  [facility];  Service: Endoscopy;  Laterality: N/A;   Sinus sergery  1988   [place]   STONE EXTRACTION WITH BASKET Right 11/08/2020   Procedure: STONE EXTRACTION WITH BASKET;  Surgeon: Marco Severs, MD;  Location: [facility];  Service: Urology;  Laterality: Right;   TRANSFORAMINAL LUMBAR INTERBODY FUSION (TLIF) WITH PEDICLE SCREW FIXATION 1 LEVEL N/A 12/05/2021   Procedure: Open Lumbar five-Sacral  one Laminectomy/Transforaminal Lumbar Interbody Fusion/Posterolateral instrumented fusion;  Surgeon: Cannon Champion, MD;  Location: [facility];  Service: Neurosurgery;  Laterality: N/A;  3C   UMBILICAL HERNIA REPAIR N/A 03/25/2014   Procedure: UMBILICAL HERNIA REPAIR;  Surgeon: Myrl Askew, MD;  Location: [facility];  Service: General;  Laterality: N/A;   uterine ablation      Social History   Socioeconomic History   Marital status: Married    Spouse name: Not on file   Number of children: 2   Years of education: college   Highest education level: 12th grade  Occupational History   Occupation: Scientist, research (medical): Audiological scientist: [employer]   Occupation: Lawyer- at [employer]  Tobacco Use   Smoking status: Never   Smokeless tobacco: Never  Vaping Use   Vaping status: Never Used  Substance and Sexual Activity   Alcohol use: No   Drug use: No   Sexual activity: Yes    Partners: Male    Birth control/protection: Post-menopausal    Comment: ablation  Other Topics Concern   Not on file  Social History Narrative   Regular exercise: walks Caffeine use:    Social Drivers of Corporate investment banker Strain: Low Risk  (02/05/2024)   Overall Financial Resource Strain (CARDIA)    Difficulty of Paying Living Expenses: Not hard at all  Food Insecurity: No  Food Insecurity (02/05/2024)   Hunger Vital Sign    Worried About Running Out of Food in the Last Year: Never true    Ran Out of Food in the Last Year: Never true  Transportation Needs: No Transportation Needs (02/05/2024)   PRAPARE - Administrator, Civil Service (Medical): No    Lack of Transportation (Non-Medical): No  Physical Activity: Insufficiently Active (02/05/2024)   Exercise Vital Sign    Days of Exercise per Week: 3 days    Minutes of Exercise per Session: 30 min  Stress: No Stress Concern Present (02/05/2024)   [employer] of Occupational Health - Occupational Stress  Questionnaire    Feeling of Stress : Only a little  Recent Concern: Stress - Stress Concern Present (12/27/2023)   [employer] of Occupational Health - Occupational Stress Questionnaire    Feeling of Stress : To some extent  Social Connections: Moderately Integrated (02/05/2024)   Social Connection and Isolation Panel [NHANES]    Frequency of Communication with Friends and Family: More than three times a week    Frequency of Social Gatherings with Friends and Family: Three times a week    Attends Religious Services: More than 4 times per year    Active Member of Clubs or Organizations: No    Attends Banker Meetings: Never    Marital Status: Married    Family History  Problem Relation Age of Onset   Breast cancer Mother    Hypertension Mother    Cancer Mother    Diabetes Mother    Stroke Mother    Alzheimer's disease Mother    Diabetes Father    Breast cancer Sister    Cancer Paternal Uncle    Breast cancer Maternal Grandmother    Breast cancer Paternal Grandmother    Depression Paternal Grandmother    Depression Paternal Grandfather    Heart disease Other    Cancer Other    Diabetes Other    Achalasia Other    Anesthesia problems Neg Hx    Hypotension Neg Hx    Malignant hyperthermia Neg Hx    Pseudochol deficiency Neg Hx    Colon cancer Neg Hx    Colon polyps Neg Hx     Outpatient Encounter Medications as of 05/06/2024  Medication Sig   tirzepatide  (MOUNJARO ) 2.5 MG/0.5ML Pen Inject 2.5 mg into the skin once a week.   albuterol  (VENTOLIN  HFA) 108 (90 Base) MCG/ACT inhaler Inhale 2 puffs into the lungs every 6 (six) hours as needed for wheezing or shortness of breath.   blood glucose meter kit and supplies Use up to four times daily as directed.   citalopram  (CELEXA ) 10 MG tablet Take 1 tablet (10 mg total) by mouth daily.   clindamycin  (CLEOCIN ) 150 MG capsule Take 1 capsule (150 mg total) by mouth 3 (three) times daily.   cyclobenzaprine  (FLEXERIL )  10 MG tablet Take 1 tablet (10 mg total) by mouth 2 (two) times daily as needed for muscle spasms.   doxycycline  (VIBRAMYCIN ) 100 MG capsule Take 1 capsule (100 mg total) by mouth 2 (two) times daily. (Patient not taking: Reported on 03/19/2024)   Empagliflozin -metFORMIN  HCl (SYNJARDY ) 12.5-500 MG TABS Take 1 tablet by mouth daily.   furosemide  (LASIX ) 20 MG tablet Take 20 mg by mouth 2 (two) times daily as needed for edema.   gabapentin  (NEURONTIN ) 600 MG tablet Take 1 tablet (600 mg total) by mouth 3 (three) times daily.   glucose blood test strip  Test up to 4 times a day   hydrOXYzine  (VISTARIL ) 25 MG capsule Take 1 capsule (25 mg total) by mouth every 8 (eight) hours as needed for itching.   ibuprofen  (ADVIL ) 800 MG tablet Take 1 tablet (800 mg total) by mouth every 8 (eight) hours as needed.   Lancets (FREESTYLE) lancets Use to test 4 times daily   lisinopril -hydrochlorothiazide  (ZESTORETIC ) 20-12.5 MG tablet Take 1 tablet by mouth daily.   lubiprostone  (AMITIZA ) 24 MCG capsule Take 1 capsule (24 mcg total) by mouth 2 (two) times daily with a meal.   meclizine  (ANTIVERT ) 25 MG tablet Take 1 tablet (25 mg total) by mouth 2 (two) times daily as needed for dizziness.   meloxicam  (MOBIC ) 7.5 MG tablet Take 1 tablet (7.5 mg total) by mouth daily.   omeprazole  (PRILOSEC) 40 MG capsule Take 1 capsule (40 mg total) by mouth daily.   ondansetron  (ZOFRAN ) 4 MG tablet Take 1 tablet (4 mg total) by mouth daily as needed for nausea or vomiting.   promethazine  (PHENERGAN ) 25 MG tablet TAKE 1 TABLET(25 MG) BY MOUTH EVERY 8 HOURS AS NEEDED FOR NAUSEA OR VOMITING   simvastatin  (ZOCOR ) 40 MG tablet Take 1 tablet (40 mg total) by mouth every evening for cholesterol   zolpidem  (AMBIEN ) 5 MG tablet Take 1 tablet (5 mg total) by mouth at bedtime as needed for sleep.   [DISCONTINUED] Dulaglutide  (TRULICITY ) 4.5 MG/0.5ML SOAJ Inject 4.5 mg as directed once a week.   No facility-administered encounter medications on  file as of 05/06/2024.    ALLERGIES: Allergies  Allergen Reactions   Ciprofloxacin  Hives and Swelling   Penicillins Anaphylaxis and Rash   Codeine Other (See Comments)    had hallicunations   Morphine  Nausea And Vomiting and Other (See Comments)    recieved in ED due to [facility] and had multple doses - gave visual hallucinations and vomitting.   Sulfonamide Derivatives Other (See Comments)    REACTION: Unsure - childhood allergy   Vancomycin  Itching    VACCINATION STATUS: Immunization History  Administered Date(s) Administered   H1N1 09/29/2008   Influenza Split 08/25/2012   Influenza Whole 08/31/2008   Influenza,inj,Quad PF,6+ Mos 10/19/2013, 08/30/2021, 09/13/2022, 09/26/2023   Moderna Covid-19 Vaccine Bivalent Booster 21yrs & up 09/09/2021   Moderna Sars-Covid-2 Vaccination 01/05/2020, 02/02/2020   Pneumococcal Polysaccharide-23 09/14/2008, 02/21/2014   Tdap 06/28/2020   Zoster Recombinant(Shingrix ) 04/04/2022, 10/08/2022    Diabetes She presents for her follow-up diabetic visit. She has type 2 diabetes mellitus. Her disease course has been improving. Pertinent negatives for hypoglycemia include no confusion, headaches, pallor or seizures. Pertinent negatives for diabetes include no chest pain, no polydipsia, no polyphagia and no polyuria. Symptoms are improving. There are no diabetic complications. Risk factors for coronary artery disease include dyslipidemia, diabetes mellitus, obesity, sedentary lifestyle and hypertension. Her weight is decreasing steadily. She is following a generally unhealthy diet. When asked about meal planning, she reported none. Her home blood glucose trend is decreasing steadily. Her breakfast blood glucose range is generally 110-130 mg/dl. Her overall blood glucose range is 110-130 mg/dl. (She presents with controlled glycemic profile.  Her point-of-care A1c is 5.7%, generally improving from 10.2%.   This is despite the fact that she was taken off of  insulin .  She is tolerating Trulicity  and Synjardy .  ) An ACE inhibitor/angiotensin II receptor blocker is being taken.  Hyperlipidemia This is a chronic problem. Exacerbating diseases include diabetes. Pertinent negatives include no chest pain, myalgias or shortness of breath. Current  antihyperlipidemic treatment includes statins. Risk factors for coronary artery disease include diabetes mellitus, dyslipidemia, hypertension, obesity, family history and a sedentary lifestyle.  Hypertension This is a chronic problem. The current episode started more than 1 year ago. The problem is controlled. Pertinent negatives include no chest pain, headaches, palpitations or shortness of breath. Risk factors for coronary artery disease include dyslipidemia, diabetes mellitus, post-menopausal state, sedentary lifestyle, family history and obesity. Past treatments include ACE inhibitors and diuretics.      Objective:       05/06/2024    3:41 PM 03/19/2024    3:04 PM 03/17/2024    3:18 PM  Vitals with BMI  Height 4\' 9"  4\' 9"  4\' 8"   Weight 165 lbs 6 oz 167 lbs 167 lbs 10 oz  BMI 35.78 36.13 37.6  Systolic 104 115 956  Diastolic 66 72 67  Pulse 84 101 94    BP 104/66   Pulse 84   Ht 4\' 9"  (1.448 m)   Wt 165 lb 6.4 oz (75 kg)   LMP 07/16/2012   BMI 35.79 kg/m   Wt Readings from Last 3 Encounters:  05/06/24 165 lb 6.4 oz (75 kg)  03/19/24 167 lb (75.8 kg)  03/17/24 167 lb 9.6 oz (76 kg)      CMP ( most recent) CMP     Component Value Date/Time   NA 139 05/05/2024 1109   K 4.1 05/05/2024 1109   CL 99 05/05/2024 1109   CO2 24 05/05/2024 1109   GLUCOSE 124 (H) 05/05/2024 1109   GLUCOSE 113 (H) 09/25/2022 1449   BUN 14 05/05/2024 1109   CREATININE 0.78 05/05/2024 1109   CREATININE 0.89 05/25/2020 1626   CALCIUM 9.2 05/05/2024 1109   PROT 6.4 05/05/2024 1109   ALBUMIN  3.9 05/05/2024 1109   AST 21 05/05/2024 1109   ALT 21 05/05/2024 1109   ALKPHOS 113 05/05/2024 1109   BILITOT 0.6  05/05/2024 1109   GFRNONAA >60 09/25/2022 1449   GFRAA >60 08/05/2020 1248    Diabetic Labs (most recent): Lab Results  Component Value Date   HGBA1C 5.7 05/06/2024   HGBA1C 6.5 05/29/2023   HGBA1C 6.2 (H) 09/25/2022   MICROALBUR 10 11/27/2022   MICROALBUR 1.10 11/14/2012     Lipid Panel ( most recent) Lipid Panel     Component Value Date/Time   CHOL 135 05/05/2024 1109   TRIG 121 05/05/2024 1109   HDL 53 05/05/2024 1109   CHOLHDL 2.5 05/05/2024 1109   CHOLHDL 4.0 07/20/2013 1100   VLDL 48 (H) 07/20/2013 1100   LDLCALC 61 05/05/2024 1109   LABVLDL 21 05/05/2024 1109      Lab Results  Component Value Date   TSH 0.909 05/05/2024   TSH 1.980 05/28/2023   TSH 1.960 02/06/2023   TSH 1.110 05/24/2022   TSH 1.438 11/18/2012   TSH 4.326 09/01/2008   FREET4 0.85 05/05/2024   FREET4 1.04 05/28/2023   FREET4 0.87 02/06/2023   FREET4 0.87 05/24/2022     Assessment & Plan:   Type 2 diabetes, unspecified complication using insulin  pump  - [name] has currently uncontrolled symptomatic type 2 DM since  58 years of age,.  She presents with controlled glycemic profile.  Her point-of-care A1c is 5.7%, generally improving from 10.2%.   This is despite the fact that she was taken off of insulin .  She is tolerating Trulicity  and Synjardy .   She has not achieved significant excess after she was taken off of insulin . -  I had a long discussion with her about the progressive nature of diabetes and the pathology behind its complications.  She does not report gross complications from her diabetes, however she remains at a high risk for more acute and chronic complications which include CAD, CVA, CKD, retinopathy, and neuropathy. These are all discussed in detail with her.  - I discussed all available options of managing her diabetes including de-escalation of medications. I have counseled her on diet  and weight management  by adopting a Whole Food , Plant Predominant  ( WFPP)  nutrition as recommended by [employer] of Lifestyle Medicine. Patient is encouraged to switch to  unprocessed or minimally processed  complex starch, adequate protein intake (mainly plant source), minimal liquid fat ( mainly vegetable oils), plenty of fruits, and vegetables. -  she is advised to stick to a routine mealtimes to eat 3 complete meals a day and snack only when necessary ( to snack only to correct hypoglycemia BG <70 day time or <100 at night).   - she acknowledges that there is a room for improvement in her food and drink choices. - Suggestion is made for her to avoid simple carbohydrates  from her diet including Cakes, Sweet Desserts, Ice Cream, Soda (diet and regular), Sweet Tea, Candies, Chips, Cookies, Store Bought Juices, Alcohol , Artificial Sweeteners,  Coffee Creamer, and "Sugar-free" Products, Lemonade. This will help patient to have more stable blood glucose profile and potentially avoid unintended weight gain.  The following Lifestyle Medicine recommendations according to American College of Lifestyle Medicine  [facility]) were discussed and and offered to patient and she  agrees to start the journey:  A. Whole Foods, Plant-Based Nutrition comprising of fruits and vegetables, plant-based proteins, whole-grain carbohydrates was discussed in detail with the patient.   A list for source of those nutrients were also provided to the patient.  Patient will use only water  or unsweetened tea for hydration. B.  The need to stay away from risky substances including alcohol, smoking; obtaining 7 to 9 hours of restorative sleep, at least 150 minutes of moderate intensity exercise weekly, the importance of healthy social connections,  and stress management techniques were discussed. C.  A full color page of  Calorie density of various food groups per pound showing examples of each food groups was provided to the patient.   - she will be scheduled with Penny Crumpton, RDN, CDE for  individualized diabetes education.  She has successfully came off of insulin  for more than a year now.  She may benefit from Mounjaro  instead of Trulicity .  I discussed and prescribed Mounjaro  2.5 mg subcutaneously weekly, to advance as tolerated.   If she does not get proper coverage for Mounjaro , she will be resumed on Trulicity  4.5 mg subcutaneously weekly. She is also benefiting from Synjardy , advised to continue Synjardy  12.5/500 mg once a day at breakfast.  - she is encouraged to call clinic for blood glucose levels less than 70 or above 150 mg per DL at fasting.     - Specific targets for  A1c;  LDL, HDL,  and Triglycerides were discussed with the patient.  2) Blood Pressure /Hypertension:    -Her blood pressure is controlled to target.  she is advised to take half tablet of her current lisinopril /HCTZ 20-12 0.5 mg p.o. daily with breakfast .  3) Lipids/Hyperlipidemia:   Review of her recent lipid panel showed controlled lipid panel with LDL at 61.  This improvement from 113.  She is  advised to continue simvastatin  40 mg p.o. daily at bedtime.  Side effects and precautions discussed with her.      4)  Weight/Diet:  Body mass index is 35.79 kg/m.  -She has lost 21 pounds so far.  Clearly complicating her diabetes care.   she is  a candidate for weight loss. I discussed with her the fact that loss of 5 - 10% of her  current body weight will have the most impact on her diabetes management.  The above detailed  AC[facility] recommendations for nutrition, exercise, sleep, social life, avoidance of risky substances, the need for restorative sleep   information will also detailed on discharge instructions.  5) Chronic Care/Health Maintenance:  -she  is on ACEI/ARB and Statin medications and  is encouraged to initiate and continue to follow up with Ophthalmology, Dentist,  Podiatrist at least yearly or according to recommendations, and advised to   stay away from smoking. I have recommended yearly flu  vaccine and pneumonia vaccine at least every 5 years; moderate intensity exercise for up to 150 minutes weekly; and  sleep for 7- 9 hours a day.  - she is  advised to maintain close follow up with Meldon Sport, MD for primary care needs, as well as her other providers for optimal and coordinated care.   I spent  31  minutes in the care of the patient today including review of labs from CMP, Lipids, Thyroid  Function, Hematology (current and previous including abstractions from other facilities); face-to-face time discussing  her blood glucose readings/logs, discussing hypoglycemia and hyperglycemia episodes and symptoms, medications doses, her options of short and long term treatment based on the latest standards of care / guidelines;  discussion about incorporating lifestyle medicine;  and documenting the encounter. Risk reduction counseling performed per USPSTF guidelines to reduce  obesity and cardiovascular risk factors.     Please refer to Patient Instructions for Blood Glucose Monitoring and Insulin /Medications Dosing Guide"  in media tab for additional information. Please  also refer to " Patient Self Inventory" in the Media  tab for reviewed elements of pertinent patient history.  [name] participated in the discussions, expressed understanding, and voiced agreement with the above plans.  All questions were answered to her satisfaction. she is encouraged to contact clinic should she have any questions or concerns prior to her return visit.   Follow up plan: - Return in about 6 months (around 11/06/2024) for Bring Meter/CGM Device/Logs- A1c in Office.  Kalvin Orf, MD [facility] [address] [place], [place] [address] Phone: [phone]  Fax: [phone]    05/06/2024, 5:04 PM  This note was partially dictated with voice recognition software. Similar sounding words can be transcribed inadequately or may not  be corrected  upon review.

## 2024-05-06 NOTE — Patient Instructions (Signed)

## 2024-05-14 ENCOUNTER — Other Ambulatory Visit (HOSPITAL_COMMUNITY): Payer: Self-pay

## 2024-05-14 ENCOUNTER — Other Ambulatory Visit: Payer: Self-pay

## 2024-05-14 ENCOUNTER — Other Ambulatory Visit: Payer: Self-pay | Admitting: Internal Medicine

## 2024-05-14 MED ORDER — SYNJARDY 12.5-500 MG PO TABS
1.0000 | ORAL_TABLET | Freq: Every day | ORAL | 0 refills | Status: DC
  Filled 2024-05-14: qty 60, 60d supply, fill #0

## 2024-05-15 ENCOUNTER — Encounter: Payer: Self-pay | Admitting: Urology

## 2024-05-15 ENCOUNTER — Ambulatory Visit: Admitting: Urology

## 2024-05-15 VITALS — BP 107/63 | HR 84

## 2024-05-15 DIAGNOSIS — N200 Calculus of kidney: Secondary | ICD-10-CM | POA: Diagnosis not present

## 2024-05-15 DIAGNOSIS — G8929 Other chronic pain: Secondary | ICD-10-CM

## 2024-05-15 DIAGNOSIS — R10A0 Flank pain, unspecified side: Secondary | ICD-10-CM

## 2024-05-15 DIAGNOSIS — R109 Unspecified abdominal pain: Secondary | ICD-10-CM

## 2024-05-15 DIAGNOSIS — R10A2 Flank pain, left side: Secondary | ICD-10-CM

## 2024-05-15 LAB — URINALYSIS, ROUTINE W REFLEX MICROSCOPIC
Bilirubin, UA: NEGATIVE
Ketones, UA: NEGATIVE
Leukocytes,UA: NEGATIVE
Nitrite, UA: NEGATIVE
Protein,UA: NEGATIVE
RBC, UA: NEGATIVE
Specific Gravity, UA: 1.015 (ref 1.005–1.030)
Urobilinogen, Ur: 0.2 mg/dL (ref 0.2–1.0)
pH, UA: 6 (ref 5.0–7.5)

## 2024-05-15 MED ORDER — CYCLOBENZAPRINE HCL 10 MG PO TABS: 10.0000 mg | ORAL_TABLET | Freq: Two times a day (BID) | ORAL | 5 refills | Status: DC | PRN

## 2024-05-15 MED ORDER — POTASSIUM CITRATE ER 15 MEQ (1620 MG) PO TBCR: 1.0000 | EXTENDED_RELEASE_TABLET | Freq: Two times a day (BID) | ORAL | 11 refills | Status: AC

## 2024-05-15 NOTE — Patient Instructions (Signed)

## 2024-05-15 NOTE — Progress Notes (Signed)
 05/15/2024 11:09 AM   [name] 01/31/1966 [id number]  Referring provider: Meldon Sport, MD [name] [place],  [place] [address]  Followup nephrolithiasis   HPI: [name] is a 57yo here for followup for nephrolithiasis and chronic left flank pain. No stone events since last visit. Renal US  4/30 showed no calculi and no hydronephrosis. She has chronic left flank pain that is worse with activity and improved with rest.    PMH: Past Medical History:  Diagnosis Date   Diabetes mellitus    insulin  pump 2013   Dysphagia 03/09/2020   Epidural hematoma (HCC) 12/14/2021   GERD (gastroesophageal reflux disease)    History of kidney stones    Hypercholesteremia    Hypertension    Incarcerated umbilical hernia 03/25/2014   Kidney stone    Kidney stones 08/31/2008   Qualifier: Diagnosis of  By: Nana Avers MD, Cornelius     Neuropathy    Obesity    Palpitations    Shortness of breath    Vertigo     Surgical History: Past Surgical History:  Procedure Laterality Date   BIOPSY  06/23/2020   Procedure: BIOPSY;  Surgeon: Suzette Espy, MD;  [facility];  Service: Endoscopy;;   CARDIAC CATHETERIZATION  12/11/2012   normal coronary arteries   CESAREAN SECTION  1994;2000   [facility]   CHOLECYSTECTOMY  1992   COLONOSCOPY WITH PROPOFOL  N/A 06/23/2020   large amount of stool in entire colon, prep inadequate.    COLONOSCOPY WITH PROPOFOL  N/A 01/03/2024   Procedure: COLONOSCOPY WITH PROPOFOL ;  Surgeon: Urban Garden, MD;  [facility];  Service: Gastroenterology;  Laterality: N/A;  2:00pm;asa 1   CYSTOSCOPY W/ URETERAL STENT PLACEMENT  05/08/2012   Procedure: CYSTOSCOPY WITH RETROGRADE PYELOGRAM/URETERAL STENT PLACEMENT;  Surgeon: Reggie Caper, MD;  Location: [facility];  Service: Urology;  Laterality: Right;   CYSTOSCOPY WITH RETROGRADE PYELOGRAM, URETEROSCOPY AND STENT PLACEMENT Right 05/04/2020   Procedure: CYSTOSCOPY WITH RIGHT RETROGRADE  PYELOGRAM, RIGHT URETEROSCOPY AND RIGHT URETERAL STENT EXCHANGE;  Surgeon: Marco Severs, MD;  Location: [facility];  Service: Urology;  Laterality: Right;   CYSTOSCOPY WITH RETROGRADE PYELOGRAM, URETEROSCOPY AND STENT PLACEMENT Left 08/08/2020   Procedure: CYSTOSCOPY WITH RETROGRADE PYELOGRAM, URETEROSCOPY WITH LASER  AND STENT PLACEMENT;  Surgeon: Marco Severs, MD;  Location: [facility];  Service: Urology;  Laterality: Left;   CYSTOSCOPY WITH RETROGRADE PYELOGRAM, URETEROSCOPY AND STENT PLACEMENT Right 11/08/2020   Procedure: CYSTOSCOPY WITH RIGHT RETROGRADE PYELOGRAM, RIGHT URETEROSCOPY AND STENT PLACEMENT;  Surgeon: Marco Severs, MD;  [facility]: [facility];  Service: Urology;  Laterality: Right;   CYSTOSCOPY WITH RETROGRADE PYELOGRAM, URETEROSCOPY AND STENT PLACEMENT Left 12/27/2020   Procedure: CYSTOSCOPY WITH LEFT RETROGRADE PYELOGRAM, LEFT URETEROSCOPY AND LEFT URETERAL STENT PLACEMENT;  Surgeon: Marco Severs, MD;  [facility]: [facility];  Service: Urology;  Laterality: Left;   CYSTOSCOPY WITH RETROGRADE PYELOGRAM, URETEROSCOPY AND STENT PLACEMENT Bilateral 03/16/2021   Procedure: CYSTOSCOPY WITH BILATERAL  RETROGRADE PYELOGRAM, BILATERAL URETEROSCOPY AND STENT PLACEMENT ON THE LEFT;  Surgeon: Marco Severs, MD;  [facility]: [facility];  Service: Urology;  Laterality: Bilateral;   CYSTOSCOPY WITH RETROGRADE PYELOGRAM, URETEROSCOPY AND STENT PLACEMENT Left 06/28/2022   Procedure: CYSTOSCOPY WITH RETROGRADE PYELOGRAM, URETEROSCOPY AND STENT PLACEMENT;  Surgeon: Marco Severs, MD;  [facility]: [facility];  Service: Urology;  Laterality: Left;   CYSTOSCOPY WITH RETROGRADE PYELOGRAM, URETEROSCOPY AND STENT PLACEMENT Bilateral 09/27/2022   Procedure: CYSTOSCOPY WITH BILATERAL RETROGRADE PYELOGRAM, URETEROSCOPY  AND STENT PLACEMENT;  Surgeon: Marco Severs, MD;  Location: [facility];  Service: Urology;  Laterality: Bilateral;  pt knows to arrive at 6:00   CYSTOSCOPY WITH STENT PLACEMENT Right  02/25/2020   Procedure: CYSTOSCOPY WITH RIGHT RETROGRADE RIGHT STENT PLACEMENT;  Surgeon: Christina Coyer, MD;  Location: [facility];  Service: Urology;  Laterality: Right;   ESOPHAGOGASTRODUODENOSCOPY (EGD) WITH PROPOFOL  N/A 06/23/2020   Normal esophagus, multiple localized erosions were found in gastric antrum s/p biopsy, normal duodenum. Empiric dilation.Reactive gastropathy with erosions, negative H.pylori, no dysplasia.     EXTRACORPOREAL SHOCK WAVE LITHOTRIPSY Right 10/11/2020   Procedure: EXTRACORPOREAL SHOCK WAVE LITHOTRIPSY (ESWL);  Surgeon: Marco Severs, MD;  Location: [facility];  Service: Urology;  Laterality: Right;   FOOT SURGERY Right March, 2013   [facility]-removal of bone spur   HOLMIUM LASER APPLICATION  05/04/2020   Procedure: HOLMIUM LASER LITHOTRIPSY RIGHT URETERAL CALCULUS;  Surgeon: Marco Severs, MD;  Location: [facility];  Service: Urology;;   HYSTEROSCOPY WITH THERMACHOICE  04/25/2012   Procedure: HYSTEROSCOPY WITH THERMACHOICE;  Surgeon: Wendelyn Halter, MD;  Location: [facility];  Service: Gynecology;  Laterality: N/A;  total therapy time= 11 minutes 42 seconds; 34 ml D5w  in and 34 ml D5w out; temp =87 degrees F   LEFT HEART CATHETERIZATION WITH CORONARY ANGIOGRAM N/A 12/11/2012   Procedure: LEFT HEART CATHETERIZATION WITH CORONARY ANGIOGRAM;  Surgeon: Avanell Leigh, MD;  Location: [facility];  Service: Cardiovascular;  Laterality: N/A;   LUMBAR WOUND DEBRIDEMENT N/A 12/14/2021   Procedure: LUMBAR HEMATOMA EVACUATION;  Surgeon: Cannon Champion, MD;  Location: [facility];  Service: Neurosurgery;  Laterality: N/A;   MALONEY DILATION N/A 06/23/2020   Procedure: Londa Rival DILATION;  Surgeon: Suzette Espy, MD;  [facility];  Service: Endoscopy;  Laterality: N/A;   Sinus sergery  1988   [facility]   STONE EXTRACTION WITH BASKET Right 11/08/2020   Procedure: STONE EXTRACTION WITH BASKET;  Surgeon: Marco Severs, MD;  Location: [facility];  Service: Urology;   Laterality: Right;   TRANSFORAMINAL LUMBAR INTERBODY FUSION (TLIF) WITH PEDICLE SCREW FIXATION 1 LEVEL N/A 12/05/2021   Procedure: Open Lumbar five-Sacral one Laminectomy/Transforaminal Lumbar Interbody Fusion/Posterolateral instrumented fusion;  Surgeon: Cannon Champion, MD;  Location: [facility];  Service: Neurosurgery;  Laterality: N/A;  3C   UMBILICAL HERNIA REPAIR N/A 03/25/2014   Procedure: UMBILICAL HERNIA REPAIR;  Surgeon: Myrl Askew, MD;  Location: [facility];  Service: General;  Laterality: N/A;   uterine ablation      Home Medications:  Allergies as of 05/15/2024       Reactions   Ciprofloxacin  Hives, Swelling   Penicillins Anaphylaxis, Rash   Codeine Other (See Comments)   had hallicunations   Morphine  Nausea And Vomiting, Other (See Comments)   recieved in ED due to [facility] and had multple doses - gave visual hallucinations and vomitting.   Sulfonamide Derivatives Other (See Comments)   REACTION: Unsure - childhood allergy   Vancomycin  Itching        Medication List        Accurate as of May 15, 2024 11:09 AM. If you have any questions, ask your nurse or doctor.          albuterol  108 (90 Base) MCG/ACT inhaler Commonly known as: VENTOLIN  HFA Inhale 2 puffs into the lungs every 6 (six) hours as needed for wheezing or shortness of breath.   blood glucose meter kit and supplies Use up  to four times daily as directed.   citalopram  10 MG tablet Commonly known as: CeleXA  Take 1 tablet (10 mg total) by mouth daily.   clindamycin  150 MG capsule Commonly known as: CLEOCIN  Take 1 capsule (150 mg total) by mouth 3 (three) times daily.   cyclobenzaprine  10 MG tablet Commonly known as: FLEXERIL  Take 1 tablet (10 mg total) by mouth 2 (two) times daily as needed for muscle spasms.   doxycycline  100 MG capsule Commonly known as: VIBRAMYCIN  Take 1 capsule (100 mg total) by mouth 2 (two) times daily.   freestyle lancets Use to test 4 times daily    furosemide  20 MG tablet Commonly known as: LASIX  Take 20 mg by mouth 2 (two) times daily as needed for edema.   gabapentin  600 MG tablet Commonly known as: NEURONTIN  Take 1 tablet (600 mg total) by mouth 3 (three) times daily.   glucose blood test strip Test up to 4 times a day   hydrOXYzine  25 MG capsule Commonly known as: VISTARIL  Take 1 capsule (25 mg total) by mouth every 8 (eight) hours as needed for itching.   ibuprofen  800 MG tablet Commonly known as: ADVIL  Take 1 tablet (800 mg total) by mouth every 8 (eight) hours as needed.   lisinopril -hydrochlorothiazide  20-12.5 MG tablet Commonly known as: ZESTORETIC  Take 1 tablet by mouth daily.   lubiprostone  24 MCG capsule Commonly known as: AMITIZA  Take 1 capsule (24 mcg total) by mouth 2 (two) times daily with a meal.   meclizine  25 MG tablet Commonly known as: ANTIVERT  Take 1 tablet (25 mg total) by mouth 2 (two) times daily as needed for dizziness.   meloxicam  7.5 MG tablet Commonly known as: Mobic  Take 1 tablet (7.5 mg total) by mouth daily.   Mounjaro  2.5 MG/0.5ML Pen Generic drug: tirzepatide  Inject 2.5 mg into the skin once a week.   omeprazole  40 MG capsule Commonly known as: PRILOSEC Take 1 capsule (40 mg total) by mouth daily.   ondansetron  4 MG tablet Commonly known as: Zofran  Take 1 tablet (4 mg total) by mouth daily as needed for nausea or vomiting.   promethazine  25 MG tablet Commonly known as: PHENERGAN  TAKE 1 TABLET(25 MG) BY MOUTH EVERY 8 HOURS AS NEEDED FOR NAUSEA OR VOMITING   simvastatin  40 MG tablet Commonly known as: ZOCOR  Take 1 tablet (40 mg total) by mouth every evening for cholesterol   Synjardy  12.5-500 MG Tabs Generic drug: Empagliflozin -metFORMIN  HCl Take 1 tablet by mouth daily.   zolpidem  5 MG tablet Commonly known as: AMBIEN  Take 1 tablet (5 mg total) by mouth at bedtime as needed for sleep.        Allergies:  Allergies  Allergen Reactions   Ciprofloxacin  Hives  and Swelling   Penicillins Anaphylaxis and Rash   Codeine Other (See Comments)    had hallicunations   Morphine  Nausea And Vomiting and Other (See Comments)    recieved in ED due to [facility] and had multple doses - gave visual hallucinations and vomitting.   Sulfonamide Derivatives Other (See Comments)    REACTION: Unsure - childhood allergy   Vancomycin  Itching    Family History: Family History  Problem Relation Age of Onset   Breast cancer Mother    Hypertension Mother    Cancer Mother    Diabetes Mother    Stroke Mother    Alzheimer's disease Mother    Diabetes Father    Breast cancer Sister    Cancer Paternal Uncle    Breast  cancer Maternal Grandmother    Breast cancer Paternal Grandmother    Depression Paternal Grandmother    Depression Paternal Grandfather    Heart disease Other    Cancer Other    Diabetes Other    Achalasia Other    Anesthesia problems Neg Hx    Hypotension Neg Hx    Malignant hyperthermia Neg Hx    Pseudochol deficiency Neg Hx    Colon cancer Neg Hx    Colon polyps Neg Hx     Social History:  reports that she has never smoked. She has never used smokeless tobacco. She reports that she does not drink alcohol and does not use drugs.  ROS: All other review of systems were reviewed and are negative except what is noted above in HPI  Physical Exam: BP 107/63   Pulse 84   LMP 07/16/2012   Constitutional:  Alert and oriented, No acute distress. HEENT: [place] AT, moist mucus membranes.  Trachea midline, no masses. Cardiovascular: No clubbing, cyanosis, or edema. Respiratory: Normal respiratory effort, no increased work of breathing. GI: Abdomen is soft, nontender, nondistended, no abdominal masses GU: No CVA tenderness.  Lymph: No cervical or inguinal lymphadenopathy. Skin: No rashes, bruises or suspicious lesions. Neurologic: Grossly intact, no focal deficits, moving all 4 extremities. Psychiatric: Normal mood and affect.  Laboratory  Data: Lab Results  Component Value Date   WBC 7.8 07/02/2022   HGB 14.2 07/02/2022   HCT 43.9 07/02/2022   MCV 85.4 07/02/2022   PLT 302 07/02/2022    Lab Results  Component Value Date   CREATININE 0.78 05/05/2024    No results found for: "PSA"  No results found for: "TESTOSTERONE"  Lab Results  Component Value Date   HGBA1C 5.7 05/06/2024    Urinalysis    Component Value Date/Time   COLORURINE STRAW (A) 07/02/2022 0717   APPEARANCEUR Clear 05/01/2023 1531   LABSPEC 1.003 (L) 07/02/2022 0717   PHURINE 6.0 07/02/2022 0717   GLUCOSEU 3+ (A) 05/01/2023 1531   HGBUR LARGE (A) 07/02/2022 0717   HGBUR moderate 08/31/2008 0908   BILIRUBINUR Negative 05/01/2023 1531   KETONESUR negative 04/15/2023 1427   KETONESUR NEGATIVE 07/02/2022 0717   PROTEINUR Negative 05/01/2023 1531   PROTEINUR NEGATIVE 07/02/2022 0717   UROBILINOGEN 0.2 04/15/2023 1427   UROBILINOGEN 0.2 09/21/2014 1055   NITRITE Negative 05/01/2023 1531   NITRITE NEGATIVE 07/02/2022 0717   LEUKOCYTESUR Negative 05/01/2023 1531   LEUKOCYTESUR NEGATIVE 07/02/2022 0717    Lab Results  Component Value Date   LABMICR Comment 05/01/2023   WBCUA 6-10 (A) 01/05/2022   LABEPIT 0-10 01/05/2022   BACTERIA NONE SEEN 07/02/2022    Pertinent Imaging: Renal US  04/08/24: Images reviewed and discussed with the patient  Results for orders placed during the hospital encounter of 12/12/23  DG Abd 1 View  Narrative CLINICAL DATA:  Changes in bowel movements.  Constipation.  EXAM: ABDOMEN - 1 VIEW  COMPARISON:  None Available.  FINDINGS: Large amount of stool throughout the colon. Nonobstructive bowel gas pattern. Lung bases are clear. Lumbar spinal fusion hardware.  IMPRESSION: Large amount of stool throughout the colon.   Electronically Signed By: Jone Neither M.D. On: 12/19/2023 21:49  No results found for this or any previous visit.  No results found for this or any previous visit.  No results found  for this or any previous visit.  Results for orders placed during the hospital encounter of 04/08/24  US  RENAL  Narrative CLINICAL DATA:  kidney  stone known or suspected  EXAM: RENAL / URINARY TRACT ULTRASOUND COMPLETE  COMPARISON:  October 03, 2023, August 10, 2022  FINDINGS: Right Kidney:  Renal measurements: 11.2 x 5.1 x 5.9 cm = volume: 174 mL. Echogenicity within normal limits. No mass visualized. Similar scarring of the superior pole with fullness of the superior pole calices compared to prior versus peripelvic cyst of the superior pole.  Left Kidney:  Renal measurements: 11.2 x 5.2 x 4.9 cm = volume: 146 mL. Echogenicity within normal limits. No mass or hydronephrosis visualized.  Bladder:  Appears normal for degree of bladder distention.  Other:  None.  IMPRESSION: No new hydronephrosis. Similar fullness of the superior pole of the RIGHT kidney compared to prior.   Electronically Signed By: Clancy Crimes M.D. On: 04/15/2024 15:41  No results found for this or any previous visit.  No results found for this or any previous visit.  Results for orders placed during the hospital encounter of 08/10/22  CT RENAL STONE STUDY  Narrative CLINICAL DATA:  Left flank pain  EXAM: CT ABDOMEN AND PELVIS WITHOUT CONTRAST  TECHNIQUE: Multidetector CT imaging of the abdomen and pelvis was performed following the standard protocol without IV contrast.  RADIATION DOSE REDUCTION: This exam was performed according to the departmental dose-optimization program which includes automated exposure control, adjustment of the mA and/or kV according to patient size and/or use of iterative reconstruction technique.  COMPARISON:  None Available.  FINDINGS: Lower chest: No acute abnormality.  Hepatobiliary: No focal liver abnormality is seen. Status post cholecystectomy. No biliary dilatation.  Pancreas: Unremarkable  Spleen:  Unremarkable  Adrenals/Urinary Tract: The adrenal glands are unremarkable. The kidneys are normal in size and position. Scattered nonobstructing renal calculi are seen within the kidneys bilaterally measuring up to 5 mm within the lower pole of the right kidney and 4 mm within the interpolar region of the left kidney. No hydronephrosis. No ureteral calculi. The bladder is unremarkable.  Stomach/Bowel: Stomach is within normal limits. Appendix appears normal. No evidence of bowel wall thickening, distention, or inflammatory changes.  Vascular/Lymphatic: Aortic atherosclerosis. No enlarged abdominal or pelvic lymph nodes.  Reproductive: Uterus and bilateral adnexa are unremarkable.  Other: No abdominal wall hernia.  No abdominopelvic ascites.  Musculoskeletal: Mild subcutaneous infiltration within the right lower quadrant abdominal wall is nonspecific, possibly related to local trauma or inflammation. L5-S1 not lumbar fusion with instrumentation has been performed. No acute bone abnormality. No lytic or blastic bone lesion.  IMPRESSION: 1. No acute intra-abdominal pathology identified. No definite radiographic explanation for the patient's reported symptoms. 2. Mild bilateral nonobstructing nephrolithiasis. No urolithiasis. No hydronephrosis.  Aortic Atherosclerosis (ICD10-I70.0).   Electronically Signed By: Worthy Heads M.D. On: 08/12/2022 22:01   Assessment & Plan:    1. Nephrolithiasis (Primary) -followup 6 monhts with a renal US  - Urinalysis, Routine w reflex microscopic  2. Chronic left flank pain Refill cyclobenzapine   No follow-ups on file.  Johnie Nailer, MD  [facility] 

## 2024-05-26 ENCOUNTER — Other Ambulatory Visit: Payer: Self-pay

## 2024-06-04 ENCOUNTER — Other Ambulatory Visit (HOSPITAL_COMMUNITY): Payer: Self-pay

## 2024-06-19 ENCOUNTER — Other Ambulatory Visit (HOSPITAL_COMMUNITY): Payer: Self-pay

## 2024-06-22 ENCOUNTER — Other Ambulatory Visit: Payer: Self-pay

## 2024-06-24 ENCOUNTER — Telehealth: Payer: Self-pay

## 2024-06-24 NOTE — Telephone Encounter (Signed)
 Spoke to pt, let her know to send a picture , put her down for 9:20am, states she may can come in earlier right at 8 if okay?

## 2024-06-24 NOTE — Telephone Encounter (Signed)
 Copied from CRM [id number]. Topic: Clinical - Medical Advice >> Jun 24, 2024  1:41 PM Sophia H wrote: Reason for CRM: Patient states thinks she has a callous on right foot and otc medications not helping. She wanted to know if she could come in for an appointment today after 3p but nothing available on my end. Patient stated she does not want to go to the urgent care either. Can't come tomorrow as she is working long hours, offered appointment Friday but she is concerned given her diabetes history and doesn't want it to get worse and keep her from walking. Please advise # [phone]

## 2024-06-26 ENCOUNTER — Encounter: Payer: Self-pay | Admitting: Internal Medicine

## 2024-06-26 ENCOUNTER — Ambulatory Visit: Admitting: Internal Medicine

## 2024-06-26 ENCOUNTER — Other Ambulatory Visit (HOSPITAL_COMMUNITY): Payer: Self-pay

## 2024-06-26 VITALS — BP 127/76 | HR 78 | Ht <= 58 in | Wt 168.6 lb

## 2024-06-26 DIAGNOSIS — F5101 Primary insomnia: Secondary | ICD-10-CM | POA: Diagnosis not present

## 2024-06-26 DIAGNOSIS — R3915 Urgency of urination: Secondary | ICD-10-CM | POA: Insufficient documentation

## 2024-06-26 DIAGNOSIS — E1169 Type 2 diabetes mellitus with other specified complication: Secondary | ICD-10-CM

## 2024-06-26 DIAGNOSIS — F3341 Major depressive disorder, recurrent, in partial remission: Secondary | ICD-10-CM

## 2024-06-26 DIAGNOSIS — E782 Mixed hyperlipidemia: Secondary | ICD-10-CM

## 2024-06-26 DIAGNOSIS — Z794 Long term (current) use of insulin: Secondary | ICD-10-CM

## 2024-06-26 DIAGNOSIS — B353 Tinea pedis: Secondary | ICD-10-CM | POA: Insufficient documentation

## 2024-06-26 DIAGNOSIS — I10 Essential (primary) hypertension: Secondary | ICD-10-CM | POA: Diagnosis not present

## 2024-06-26 MED ORDER — CITALOPRAM HYDROBROMIDE 10 MG PO TABS
10.0000 mg | ORAL_TABLET | Freq: Every day | ORAL | 5 refills | Status: DC
  Filled 2024-06-26 – 2024-07-24 (×2): qty 30, 30d supply, fill #0
  Filled 2024-08-25: qty 30, 30d supply, fill #1
  Filled 2024-09-18: qty 30, 30d supply, fill #2
  Filled 2024-10-13: qty 30, 30d supply, fill #3
  Filled 2024-10-30: qty 30, 30d supply, fill #4
  Filled 2024-11-27: qty 60, 60d supply, fill #4

## 2024-06-26 MED ORDER — CLOTRIMAZOLE-BETAMETHASONE 1-0.05 % EX CREA
1.0000 | TOPICAL_CREAM | Freq: Two times a day (BID) | CUTANEOUS | 0 refills | Status: DC
Start: 2024-06-26 — End: 2024-07-29

## 2024-06-26 NOTE — Assessment & Plan Note (Signed)
 DC Vistaril  as her dry mouth is likely due to it Has been on Ativan  and Xanax in the past On Ambien  5 mg at bedtime Sleep hygiene discussed

## 2024-06-26 NOTE — Assessment & Plan Note (Signed)
 Lab Results  Component Value Date   HGBA1C 5.7 05/06/2024   Well controlled Associated with diabetic neuropathy, HTN and HLD followed by Dr. Nida now On Mounjaro  2.5 mg qw and Synjardy  12.5-500 mg QD Considering her GI symptoms, can try a different GLP1 agonist, defer to Dr. Lenis Advised to follow diabetic diet On statin and ACEi F/u CMP and lipid panel Diabetic eye exam: Advised to follow up with Ophthalmology for diabetic eye exam

## 2024-06-26 NOTE — Patient Instructions (Signed)
 Please apply Lotrisone  cream over rash area.  Please keep area clean and dry.  Please continue to take medications as prescribed.  Please continue to follow low carb diet and perform moderate exercise/walking at least 150 mins/week.

## 2024-06-26 NOTE — Assessment & Plan Note (Addendum)
 On Zocor  40 mg once daily Continue to follow low-carb diet

## 2024-06-26 NOTE — Progress Notes (Addendum)
 Established Patient Office Visit  Subjective:  Patient ID: [name], female    DOB: 02/28/1966  Age: 58 y.o. MRN: [id number]  CC:  Chief Complaint  Patient presents with   Foot Pain    Has concerns about her right foot has callous that's causing pain.    Urinary Frequency    Pt reports urinary incontinence at times.     HPI [name] is a 58 y.o. female with past medical history of DM, HTN, OSA, lumbar spinal stenosis and insomnia who presents for f/u of her chronic medical conditions.  She reports having a skin lesion in between the toes of her right foot.  She thought it was callus, but has skin peeling, likely due to tinea infection.  Denies any local discharge.  Does not have local bleeding, has mild surrounding edema.  HTN: BP is well-controlled. Takes medications regularly. Patient denies headache, dizziness, chest pain, dyspnea or palpitations.  Type II DM: She has been taking Synjardy  and Mounjaro .  Her HbA1c has improved to 5.7. Her home blood glucose has been ranging around 90-120. She denies any polyuria, polydipsia or polyphagia.  She takes Ambien  for insomnia. Denies SI or HI currently.  She has had cystoscopy with stent placement for retrieval of recurrent nephrolithiasis.  Followed by urology.  She reports urinary urgency and leakage at times, worse for the last 2 weeks.  Denies dysuria or hematuria.  She reports chronic epigastric discomfort, excessive burping with foul smell and lack of appetite due to it.  She is taking omeprazole  for GERD.  Of note, she takes Mounjaro  for type II DM.  Past Medical History:  Diagnosis Date   Diabetes mellitus    insulin  pump 2013   Dysphagia 03/09/2020   Epidural hematoma (HCC) 12/14/2021   GERD (gastroesophageal reflux disease)    History of kidney stones    Hypercholesteremia    Hypertension    Incarcerated umbilical hernia 03/25/2014   Kidney stone    Kidney stones 08/31/2008   Qualifier: Diagnosis of  By:  Karren MD, Cornelius     Neuropathy    Obesity    Palpitations    Shortness of breath    Vertigo     Past Surgical History:  Procedure Laterality Date   BIOPSY  06/23/2020   Procedure: BIOPSY;  Surgeon: Shaaron Lamar HERO, MD;  [facility];  Service: Endoscopy;;   CARDIAC CATHETERIZATION  12/11/2012   normal coronary arteries   CESAREAN SECTION  1994;2000   [facility]   CHOLECYSTECTOMY  1992   COLONOSCOPY WITH PROPOFOL  N/A 06/23/2020   large amount of stool in entire colon, prep inadequate.    COLONOSCOPY WITH PROPOFOL  N/A 01/03/2024   Procedure: COLONOSCOPY WITH PROPOFOL ;  Surgeon: Eartha Angelia Sieving, MD;  [facility]: [facility];  Service: Gastroenterology;  Laterality: N/A;  2:00pm;asa 1   CYSTOSCOPY W/ URETERAL STENT PLACEMENT  05/08/2012   Procedure: CYSTOSCOPY WITH RETROGRADE PYELOGRAM/URETERAL STENT PLACEMENT;  Surgeon: Emery LILLETTE Blaze, MD;  Location: [facility];  Service: Urology;  Laterality: Right;   CYSTOSCOPY WITH RETROGRADE PYELOGRAM, URETEROSCOPY AND STENT PLACEMENT Right 05/04/2020   Procedure: CYSTOSCOPY WITH RIGHT RETROGRADE PYELOGRAM, RIGHT URETEROSCOPY AND RIGHT URETERAL STENT EXCHANGE;  Surgeon: Sherrilee Belvie CROME, MD;  Location: [facility];  Service: Urology;  Laterality: Right;   CYSTOSCOPY WITH RETROGRADE PYELOGRAM, URETEROSCOPY AND STENT PLACEMENT Left 08/08/2020   Procedure: CYSTOSCOPY WITH RETROGRADE PYELOGRAM, URETEROSCOPY WITH LASER  AND STENT PLACEMENT;  Surgeon: Sherrilee Belvie CROME, MD;  Location: [facility];  Service: Urology;  Laterality: Left;   CYSTOSCOPY WITH RETROGRADE PYELOGRAM, URETEROSCOPY AND STENT PLACEMENT Right 11/08/2020   Procedure: CYSTOSCOPY WITH RIGHT RETROGRADE PYELOGRAM, RIGHT URETEROSCOPY AND STENT PLACEMENT;  Surgeon: Sherrilee Belvie CROME, MD;  Location: [facility];  Service: Urology;  Laterality: Right;   CYSTOSCOPY WITH RETROGRADE PYELOGRAM, URETEROSCOPY AND STENT PLACEMENT Left 12/27/2020   Procedure: CYSTOSCOPY WITH LEFT RETROGRADE  PYELOGRAM, LEFT URETEROSCOPY AND LEFT URETERAL STENT PLACEMENT;  Surgeon: Sherrilee Belvie CROME, MD;  Location: [facility];  Service: Urology;  Laterality: Left;   CYSTOSCOPY WITH RETROGRADE PYELOGRAM, URETEROSCOPY AND STENT PLACEMENT Bilateral 03/16/2021   Procedure: CYSTOSCOPY WITH BILATERAL  RETROGRADE PYELOGRAM, BILATERAL URETEROSCOPY AND STENT PLACEMENT ON THE LEFT;  Surgeon: Sherrilee Belvie CROME, MD;  [facility]: [facility];  Service: Urology;  Laterality: Bilateral;   CYSTOSCOPY WITH RETROGRADE PYELOGRAM, URETEROSCOPY AND STENT PLACEMENT Left 06/28/2022   Procedure: CYSTOSCOPY WITH RETROGRADE PYELOGRAM, URETEROSCOPY AND STENT PLACEMENT;  Surgeon: Sherrilee Belvie CROME, MD;  [facility]: [facility];  Service: Urology;  Laterality: Left;   CYSTOSCOPY WITH RETROGRADE PYELOGRAM, URETEROSCOPY AND STENT PLACEMENT Bilateral 09/27/2022   Procedure: CYSTOSCOPY WITH BILATERAL RETROGRADE PYELOGRAM, URETEROSCOPY AND STENT PLACEMENT;  Surgeon: Sherrilee Belvie CROME, MD;  Location: [facility];  Service: Urology;  Laterality: Bilateral;  pt knows to arrive at 6:00   CYSTOSCOPY WITH STENT PLACEMENT Right 02/25/2020   Procedure: CYSTOSCOPY WITH RIGHT RETROGRADE RIGHT STENT PLACEMENT;  Surgeon: Nieves Cough, MD;  Location: [facility];  Service: Urology;  Laterality: Right;   ESOPHAGOGASTRODUODENOSCOPY (EGD) WITH PROPOFOL  N/A 06/23/2020   Normal esophagus, multiple localized erosions were found in gastric antrum s/p biopsy, normal duodenum. Empiric dilation.Reactive gastropathy with erosions, negative H.pylori, no dysplasia.     EXTRACORPOREAL SHOCK WAVE LITHOTRIPSY Right 10/11/2020   Procedure: EXTRACORPOREAL SHOCK WAVE LITHOTRIPSY (ESWL);  Surgeon: Sherrilee Belvie CROME, MD;  Location: [facility];  Service: Urology;  Laterality: Right;   FOOT SURGERY Right March, 2013   [facility]-removal of bone spur   HOLMIUM LASER APPLICATION  05/04/2020   Procedure: HOLMIUM LASER LITHOTRIPSY RIGHT URETERAL CALCULUS;  Surgeon: Sherrilee Belvie CROME, MD;   Location: [facility];  Service: Urology;;   HYSTEROSCOPY WITH THERMACHOICE  04/25/2012   Procedure: HYSTEROSCOPY WITH THERMACHOICE;  Surgeon: Vonn VEAR Inch, MD;  Location: [facility];  Service: Gynecology;  Laterality: N/A;  total therapy time= 11 minutes 42 seconds; 34 ml D5w  in and 34 ml D5w out; temp =87 degrees F   LEFT HEART CATHETERIZATION WITH CORONARY ANGIOGRAM N/A 12/11/2012   Procedure: LEFT HEART CATHETERIZATION WITH CORONARY ANGIOGRAM;  Surgeon: Dorn JINNY Lesches, MD;  Location: [facility];  Service: Cardiovascular;  Laterality: N/A;   LUMBAR WOUND DEBRIDEMENT N/A 12/14/2021   Procedure: LUMBAR HEMATOMA EVACUATION;  Surgeon: Cheryle Debby LABOR, MD;  Location: [facility];  Service: Neurosurgery;  Laterality: N/A;   MALONEY DILATION N/A 06/23/2020   Procedure: AGAPITO DILATION;  Surgeon: Shaaron Lamar HERO, MD;  [facility];  Service: Endoscopy;  Laterality: N/A;   Sinus sergery  1988   [place]   STONE EXTRACTION WITH BASKET Right 11/08/2020   Procedure: STONE EXTRACTION WITH BASKET;  Surgeon: Sherrilee Belvie CROME, MD;  Location: [facility];  Service: Urology;  Laterality: Right;   TRANSFORAMINAL LUMBAR INTERBODY FUSION (TLIF) WITH PEDICLE SCREW FIXATION 1 LEVEL N/A 12/05/2021   Procedure: Open Lumbar five-Sacral one Laminectomy/Transforaminal Lumbar Interbody Fusion/Posterolateral instrumented fusion;  Surgeon: Cheryle Debby LABOR, MD;  Location: [facility];  Service: Neurosurgery;  Laterality: N/A;  3C  UMBILICAL HERNIA REPAIR N/A 03/25/2014   Procedure: UMBILICAL HERNIA REPAIR;  Surgeon: Elsie GORMAN Holland, MD;  Location: [facility];  Service: General;  Laterality: N/A;   uterine ablation      Family History  Problem Relation Age of Onset   Breast cancer Mother    Hypertension Mother    Cancer Mother    Diabetes Mother    Stroke Mother    Alzheimer's disease Mother    Diabetes Father    Breast cancer Sister    Cancer Paternal Uncle    Breast cancer Maternal Grandmother    Breast cancer  Paternal Grandmother    Depression Paternal Grandmother    Depression Paternal Grandfather    Heart disease Other    Cancer Other    Diabetes Other    Achalasia Other    Anesthesia problems Neg Hx    Hypotension Neg Hx    Malignant hyperthermia Neg Hx    Pseudochol deficiency Neg Hx    Colon cancer Neg Hx    Colon polyps Neg Hx     Social History   Socioeconomic History   Marital status: Married    Spouse name: Not on file   Number of children: 2   Years of education: college   Highest education level: 12th grade  Occupational History   Occupation: Scientist, research (medical): Audiological scientist: [employer]   Occupation: Lawyer- at [employer]  Tobacco Use   Smoking status: Never   Smokeless tobacco: Never  Vaping Use   Vaping status: Never Used  Substance and Sexual Activity   Alcohol use: No   Drug use: No   Sexual activity: Yes    Partners: Male    Birth control/protection: Post-menopausal    Comment: ablation  Other Topics Concern   Not on file  Social History Narrative   Regular exercise: walks Caffeine use:    Social Drivers of Corporate investment banker Strain: Low Risk  (02/05/2024)   Overall Financial Resource Strain (CARDIA)    Difficulty of Paying Living Expenses: Not hard at all  Food Insecurity: No Food Insecurity (02/05/2024)   Hunger Vital Sign    Worried About Running Out of Food in the Last Year: Never true    Ran Out of Food in the Last Year: Never true  Transportation Needs: No Transportation Needs (02/05/2024)   PRAPARE - Administrator, Civil Service (Medical): No    Lack of Transportation (Non-Medical): No  Physical Activity: Insufficiently Active (02/05/2024)   Exercise Vital Sign    Days of Exercise per Week: 3 days    Minutes of Exercise per Session: 30 min  Stress: No Stress Concern Present (02/05/2024)   [employer] of Occupational Health - Occupational Stress Questionnaire    Feeling of Stress : Only a little   Recent Concern: Stress - Stress Concern Present (12/27/2023)   [employer] of Occupational Health - Occupational Stress Questionnaire    Feeling of Stress : To some extent  Social Connections: Moderately Integrated (02/05/2024)   Social Connection and Isolation Panel    Frequency of Communication with Friends and Family: More than three times a week    Frequency of Social Gatherings with Friends and Family: Three times a week    Attends Religious Services: More than 4 times per year    Active Member of Clubs or Organizations: No    Attends Banker Meetings: Never    Marital  Status: Married  Catering manager Violence: Not At Risk (02/05/2024)   Humiliation, Afraid, Rape, and Kick questionnaire    Fear of Current or Ex-Partner: No    Emotionally Abused: No    Physically Abused: No    Sexually Abused: No    Outpatient Medications Prior to Visit  Medication Sig Dispense Refill   albuterol  (VENTOLIN  HFA) 108 (90 Base) MCG/ACT inhaler Inhale 2 puffs into the lungs every 6 (six) hours as needed for wheezing or shortness of breath. 6.7 g 2   blood glucose meter kit and supplies Use up to four times daily as directed. 1 each 0   cyclobenzaprine  (FLEXERIL ) 10 MG tablet Take 1 tablet (10 mg total) by mouth 2 (two) times daily as needed for muscle spasms. 30 tablet 5   Empagliflozin -metFORMIN  HCl (SYNJARDY ) 12.5-500 MG TABS Take 1 tablet by mouth daily. 60 tablet 0   furosemide  (LASIX ) 20 MG tablet Take 20 mg by mouth 2 (two) times daily as needed for edema.     gabapentin  (NEURONTIN ) 600 MG tablet Take 1 tablet (600 mg total) by mouth 3 (three) times daily. 90 tablet 2   glucose blood test strip Test up to 4 times a day 100 each 0   hydrOXYzine  (VISTARIL ) 25 MG capsule Take 1 capsule (25 mg total) by mouth every 8 (eight) hours as needed for itching. 30 capsule 0   ibuprofen  (ADVIL ) 800 MG tablet Take 1 tablet (800 mg total) by mouth every 8 (eight) hours as needed. 30 tablet 0    Lancets (FREESTYLE) lancets Use to test 4 times daily 100 each 0   lisinopril -hydrochlorothiazide  (ZESTORETIC ) 20-12.5 MG tablet Take 1 tablet by mouth daily. 90 tablet 3   lubiprostone  (AMITIZA ) 24 MCG capsule Take 1 capsule (24 mcg total) by mouth 2 (two) times daily with a meal. 60 capsule 3   meclizine  (ANTIVERT ) 25 MG tablet Take 1 tablet (25 mg total) by mouth 2 (two) times daily as needed for dizziness. 30 tablet 0   meloxicam  (MOBIC ) 7.5 MG tablet Take 1 tablet (7.5 mg total) by mouth daily. 30 tablet 5   omeprazole  (PRILOSEC) 40 MG capsule Take 1 capsule (40 mg total) by mouth daily. 90 capsule 1   ondansetron  (ZOFRAN ) 4 MG tablet Take 1 tablet (4 mg total) by mouth daily as needed for nausea or vomiting. 20 tablet 1   Potassium Citrate  (UROCIT-K  15) 15 MEQ (1620 MG) TBCR Take 1 tablet by mouth 2 (two) times daily. 60 tablet 11   promethazine  (PHENERGAN ) 25 MG tablet TAKE 1 TABLET(25 MG) BY MOUTH EVERY 8 HOURS AS NEEDED FOR NAUSEA OR VOMITING 20 tablet 0   simvastatin  (ZOCOR ) 40 MG tablet Take 1 tablet (40 mg total) by mouth every evening for cholesterol 90 tablet 3   tirzepatide  (MOUNJARO ) 2.5 MG/0.5ML Pen Inject 2.5 mg into the skin once a week. 6 mL 1   zolpidem  (AMBIEN ) 5 MG tablet Take 1 tablet (5 mg total) by mouth at bedtime as needed for sleep. 30 tablet 3   citalopram  (CELEXA ) 10 MG tablet Take 1 tablet (10 mg total) by mouth daily. 30 tablet 3   clindamycin  (CLEOCIN ) 150 MG capsule Take 1 capsule (150 mg total) by mouth 3 (three) times daily. 21 capsule 0   doxycycline  (VIBRAMYCIN ) 100 MG capsule Take 1 capsule (100 mg total) by mouth 2 (two) times daily. 14 capsule 0   No facility-administered medications prior to visit.    Allergies  Allergen Reactions  Ciprofloxacin  Hives and Swelling   Penicillins Anaphylaxis and Rash   Codeine Other (See Comments)    had hallicunations   Morphine  Nausea And Vomiting and Other (See Comments)    recieved in ED due to [facility] and  had multple doses - gave visual hallucinations and vomitting.   Sulfonamide Derivatives Other (See Comments)    REACTION: Unsure - childhood allergy   Vancomycin  Itching    ROS Review of Systems  Constitutional:  Positive for fatigue. Negative for chills and fever.  HENT:  Negative for congestion and sore throat.   Eyes:  Negative for pain and discharge.  Respiratory:  Negative for cough and shortness of breath.   Cardiovascular:  Negative for chest pain and palpitations.  Gastrointestinal:  Negative for abdominal pain, nausea and vomiting.  Endocrine: Negative for polydipsia and polyuria.  Genitourinary:  Positive for urgency. Negative for dysuria and hematuria.  Musculoskeletal:  Positive for back pain. Negative for neck pain and neck stiffness.  Skin:  Negative for rash.  Neurological:  Negative for dizziness and weakness.  Psychiatric/Behavioral:  Positive for sleep disturbance. Negative for agitation and behavioral problems.       Objective:    Physical Exam Vitals reviewed.  Constitutional:      General: She is not in acute distress.    Appearance: She is obese. She is not diaphoretic.  HENT:     Head: Normocephalic and atraumatic.     Mouth/Throat:     Mouth: Mucous membranes are moist.     Pharynx: No posterior oropharyngeal erythema.  Eyes:     General: No scleral icterus.    Extraocular Movements: Extraocular movements intact.  Cardiovascular:     Rate and Rhythm: Normal rate and regular rhythm.     Heart sounds: Normal heart sounds. No murmur heard. Pulmonary:     Breath sounds: Normal breath sounds. No wheezing or rales.  Musculoskeletal:     Cervical back: Neck supple. No tenderness.     Right lower leg: No edema.     Left lower leg: No edema.  Skin:    General: Skin is warm.     Findings: Lesion (Mild erythema and skin peeling in the interdigital space of 2nd and 3rd toe of right foot) present. No rash.  Neurological:     General: No focal deficit  present.     Mental Status: She is alert and oriented to person, place, and time.  Psychiatric:        Mood and Affect: Mood normal.        Behavior: Behavior normal.     BP 127/76   Pulse 78   Ht 4' 8 (1.422 m)   Wt 168 lb 9.6 oz (76.5 kg)   LMP 07/16/2012   SpO2 94%   BMI 37.80 kg/m  Wt Readings from Last 3 Encounters:  06/26/24 168 lb 9.6 oz (76.5 kg)  05/06/24 165 lb 6.4 oz (75 kg)  03/19/24 167 lb (75.8 kg)    Lab Results  Component Value Date   TSH 0.909 05/05/2024   Lab Results  Component Value Date   WBC 7.8 07/02/2022   HGB 14.2 07/02/2022   HCT 43.9 07/02/2022   MCV 85.4 07/02/2022   PLT 302 07/02/2022   Lab Results  Component Value Date   NA 139 05/05/2024   K 4.1 05/05/2024   CO2 24 05/05/2024   GLUCOSE 124 (H) 05/05/2024   BUN 14 05/05/2024   CREATININE 0.78 05/05/2024   BILITOT 0.6  05/05/2024   ALKPHOS 113 05/05/2024   AST 21 05/05/2024   ALT 21 05/05/2024   PROT 6.4 05/05/2024   ALBUMIN  3.9 05/05/2024   CALCIUM 9.2 05/05/2024   ANIONGAP 8 09/25/2022   EGFR 89 05/05/2024   Lab Results  Component Value Date   CHOL 135 05/05/2024   Lab Results  Component Value Date   HDL 53 05/05/2024   Lab Results  Component Value Date   LDLCALC 61 05/05/2024   Lab Results  Component Value Date   TRIG 121 05/05/2024   Lab Results  Component Value Date   CHOLHDL 2.5 05/05/2024   Lab Results  Component Value Date   HGBA1C 5.7 05/06/2024      Assessment & Plan:   Problem List Items Addressed This Visit    Diabetes mellitus (HCC) Lab Results  Component Value Date   HGBA1C 5.7 05/06/2024   Well controlled Associated with diabetic neuropathy, HTN and HLD followed by Dr. Lenis now On Mounjaro  2.5 mg qw and Synjardy  12.5-500 mg QD Considering her GI symptoms, can try a different GLP1 agonist, defer to Dr. Lenis Advised to follow diabetic diet On statin and ACEi F/u CMP and lipid panel Diabetic eye exam: Advised to follow up with  Ophthalmology for diabetic eye exam  Primary insomnia DC Vistaril  as her dry mouth is likely due to it Has been on Ativan  and Xanax in the past On Ambien  5 mg at bedtime Sleep hygiene discussed  Mixed hyperlipidemia On Zocor  40 mg once daily Continue to follow low-carb diet  Urinary urgency Recent worsening of urinary urgency and leakage Check UA with reflex culture If UA negative for UTI, will plan to start oxybutynin   Essential hypertension, benign BP Readings from Last 1 Encounters:  06/26/24 127/76   Well-controlled with Lisinopril -HCTZ 20-12.5 mg QD Counseled for compliance with the medications Advised DASH diet and moderate exercise/walking as tolerated  Tinea pedis of right foot Advised to keep area clean and dry Prescribed Lotrisone  cream for local skin lesion If persistent, may need topical mupirocin  and oral antibiotic     Meds ordered this encounter  Medications   clotrimazole -betamethasone  (LOTRISONE ) cream    Sig: Apply 1 Application topically 2 (two) times daily.    Dispense:  30 g    Refill:  0   citalopram  (CELEXA ) 10 MG tablet    Sig: Take 1 tablet (10 mg total) by mouth daily.    Dispense:  30 tablet    Refill:  5    Follow-up: Return in about 4 months (around 10/27/2024) for HTN and insomnia.    Suzzane MARLA Blanch, MD

## 2024-06-26 NOTE — Assessment & Plan Note (Signed)
 BP Readings from Last 1 Encounters:  06/26/24 127/76   Well-controlled with Lisinopril -HCTZ 20-12.5 mg QD Counseled for compliance with the medications Advised DASH diet and moderate exercise/walking as tolerated

## 2024-06-26 NOTE — Assessment & Plan Note (Signed)
 Recent worsening of urinary urgency and leakage Check UA with reflex culture If UA negative for UTI, will plan to start oxybutynin 

## 2024-06-26 NOTE — Assessment & Plan Note (Signed)
 Advised to keep area clean and dry Prescribed Lotrisone  cream for local skin lesion If persistent, may need topical mupirocin  and oral antibiotic

## 2024-06-28 LAB — UA/M W/RFLX CULTURE, ROUTINE
Bilirubin, UA: NEGATIVE
Ketones, UA: NEGATIVE
Leukocytes,UA: NEGATIVE
Nitrite, UA: NEGATIVE
Protein,UA: NEGATIVE
RBC, UA: NEGATIVE
Specific Gravity, UA: 1.029 (ref 1.005–1.030)
Urobilinogen, Ur: 0.2 mg/dL (ref 0.2–1.0)
pH, UA: 6.5 (ref 5.0–7.5)

## 2024-06-28 LAB — MICROSCOPIC EXAMINATION
Bacteria, UA: NONE SEEN
Casts: NONE SEEN /LPF
RBC, Urine: NONE SEEN /HPF (ref 0–2)
WBC, UA: NONE SEEN /HPF (ref 0–5)

## 2024-06-28 LAB — MICROALBUMIN / CREATININE URINE RATIO
Creatinine, Urine: 67.9 mg/dL
Microalb/Creat Ratio: 5 mg/g{creat} (ref 0–29)
Microalbumin, Urine: 3.3 ug/mL

## 2024-06-29 ENCOUNTER — Ambulatory Visit: Payer: Self-pay | Admitting: Internal Medicine

## 2024-06-29 ENCOUNTER — Other Ambulatory Visit: Payer: Self-pay | Admitting: Internal Medicine

## 2024-06-29 ENCOUNTER — Other Ambulatory Visit (HOSPITAL_COMMUNITY): Payer: Self-pay

## 2024-06-29 ENCOUNTER — Other Ambulatory Visit: Payer: Self-pay

## 2024-06-29 ENCOUNTER — Encounter: Payer: Self-pay | Admitting: Internal Medicine

## 2024-06-29 DIAGNOSIS — L03031 Cellulitis of right toe: Secondary | ICD-10-CM

## 2024-06-29 MED ORDER — OXYBUTYNIN CHLORIDE ER 5 MG PO TB24
5.0000 mg | ORAL_TABLET | Freq: Every day | ORAL | 5 refills | Status: DC
  Filled 2024-06-29: qty 30, 30d supply, fill #0
  Filled 2024-07-24: qty 30, 30d supply, fill #1
  Filled 2024-08-25: qty 30, 30d supply, fill #2
  Filled 2024-09-28: qty 30, 30d supply, fill #0
  Filled 2024-10-30: qty 30, 30d supply, fill #1
  Filled 2024-11-27: qty 60, 60d supply, fill #1

## 2024-06-29 MED ORDER — DOXYCYCLINE HYCLATE 100 MG PO TABS: 100.0000 mg | ORAL_TABLET | Freq: Two times a day (BID) | ORAL | 0 refills | Status: DC

## 2024-06-29 NOTE — Addendum Note (Signed)
 Addended byBETHA TOBIE DOWNS on: 06/29/2024 09:09 AM   Modules accepted: Orders

## 2024-07-06 ENCOUNTER — Ambulatory Visit (HOSPITAL_COMMUNITY)

## 2024-07-07 ENCOUNTER — Ambulatory Visit (INDEPENDENT_AMBULATORY_CARE_PROVIDER_SITE_OTHER): Admitting: Gastroenterology

## 2024-07-10 ENCOUNTER — Other Ambulatory Visit (HOSPITAL_COMMUNITY): Payer: Self-pay

## 2024-07-13 ENCOUNTER — Other Ambulatory Visit: Payer: Self-pay | Admitting: Medical Genetics

## 2024-07-20 ENCOUNTER — Encounter (HOSPITAL_COMMUNITY): Payer: Self-pay

## 2024-07-20 ENCOUNTER — Ambulatory Visit (HOSPITAL_COMMUNITY)
Admission: RE | Admit: 2024-07-20 | Discharge: 2024-07-20 | Disposition: A | Discharge status: Home / Self Care | Source: Ambulatory Visit | Attending: Adult Health | Admitting: Adult Health

## 2024-07-20 ENCOUNTER — Ambulatory Visit: Payer: Self-pay

## 2024-07-20 DIAGNOSIS — Z1231 Encounter for screening mammogram for malignant neoplasm of breast: Secondary | ICD-10-CM | POA: Diagnosis not present

## 2024-07-20 NOTE — Telephone Encounter (Signed)
 FYI Only or Action Required?: Action required by provider: request for appointment and update on patient condition.  Patient was last seen in primary care on 06/26/2024 by Tobie Suzzane POUR, MD.  Called Nurse Triage reporting Skin Ulcer.  Symptoms began several weeks ago.  Interventions attempted: Prescription medications: reports oral abx and cream.  Symptoms are: gradually worsening.  Triage Disposition: See Physician Within 24 Hours  Patient/caregiver understands and will follow disposition?: No, refuses disposition  Copied from CRM [id number]. Topic: Clinical - Red Word Triage >> Jul 20, 2024  4:21 PM Ivette P wrote: [facility] that prompted transfer to Nurse Triage: skin breakdown and was given cream and antibiotic and no longer has antibiotic. seemed like it was getting better. hurts to walk, pain level about 8.  Skin peeling, Raw skin. Reason for Disposition  Foot or toe pain  Answer Assessment - Initial Assessment Questions 1. SYMPTOM: What's the main symptom you're concerned about? (e.g., rash, sore, callus, drainage, numbness)     Skin breakdown, peeling 2. LOCATION: Where is the  skin breakdown located? (e.g., foot/toe, top/bottom, left/right)     Bottom between toes 3. APPEARANCE: What does the area look like? (e.g., normal, red, swollen; size)     breakdown 4. ONSET: When did the  skin symptoms  start?     Ongoing since Mid July 5. PAIN: Is there any pain? If Yes, ask: How bad is it? (Scale: 0-10; none, mild, moderate, severe)     8/10 6. CAUSE: What do you think is causing the symptoms?     Unsure 7. BLOOD GLUCOSE: What is your blood glucose level?      Normal range-controlled 8. USUAL RANGE: What is your blood glucose level usually? (e.g., usual fasting morning value, usual evening value)      9. OTHER SYMPTOMS: Do you have any other symptoms? (e.g., fever, weakness)     No  Additional info: 1) Treated with Doxycycline  and silver 06/29/24 with  symptoms improvement, she is unsure if the infection ever went entirely away. Symptoms are progressing back to before treatment with abx, redness has returned. Not spreading, no drainage. 2) No appointments are available in practice until September. Patient is requesting to be worked in to [employer] or other provider schedule, she prefers Wednesday after 3pm if possible. Requests call back as she does not have MyChart always available.  Protocols used: Diabetes - Foot Problems and Questions-A-[facility]

## 2024-07-21 NOTE — Telephone Encounter (Signed)
 Called patient back scheduled in septe, added to a waiting list

## 2024-07-21 NOTE — Telephone Encounter (Signed)
 Called patient left a voicemail to call to schedule an appointment

## 2024-07-22 ENCOUNTER — Ambulatory Visit: Payer: Self-pay | Admitting: Adult Health

## 2024-07-24 ENCOUNTER — Other Ambulatory Visit: Payer: Self-pay

## 2024-07-24 ENCOUNTER — Other Ambulatory Visit: Payer: Self-pay | Admitting: Internal Medicine

## 2024-07-24 ENCOUNTER — Other Ambulatory Visit (HOSPITAL_COMMUNITY): Payer: Self-pay

## 2024-07-24 MED ORDER — GABAPENTIN 600 MG PO TABS
600.0000 mg | ORAL_TABLET | Freq: Three times a day (TID) | ORAL | 2 refills | Status: DC
  Filled 2024-07-24: qty 90, 30d supply, fill #0
  Filled 2024-08-25: qty 90, 30d supply, fill #1
  Filled 2024-09-18: qty 90, 30d supply, fill #2

## 2024-07-27 ENCOUNTER — Ambulatory Visit: Payer: Self-pay

## 2024-07-27 NOTE — Telephone Encounter (Signed)
 3rd attempt to contact patient. Voicemail left to call back to Nurse Triage. Will route to office.

## 2024-07-27 NOTE — Telephone Encounter (Signed)
 Placed phone call to patient-no answer. Voicemail left for patient to call back to Nurse Triage. Will place in call backs

## 2024-07-27 NOTE — Telephone Encounter (Signed)
 Copied from CRM #[id number]. Topic: Clinical - Red Word Triage >> Jul 27, 2024  1:12 PM Ahlexyia S wrote: [facility] that prompted transfer to Nurse Triage: Pt called in stating that her feet itch and burn really bad. She said that she has scratched them so much that she now has sores. When asked how long she stated its been several weeks. Pt has attempted to resolve on her own but nothing has been working. Scale of pain is an 8. Attempted to transfer pt to nurse triage but phone disconnected and when reaching out to pt there was no answer.

## 2024-07-29 ENCOUNTER — Telehealth

## 2024-07-29 ENCOUNTER — Telehealth: Payer: Self-pay

## 2024-07-29 ENCOUNTER — Other Ambulatory Visit: Payer: Self-pay

## 2024-07-29 DIAGNOSIS — L299 Pruritus, unspecified: Secondary | ICD-10-CM | POA: Diagnosis not present

## 2024-07-29 DIAGNOSIS — N200 Calculus of kidney: Secondary | ICD-10-CM

## 2024-07-29 MED ORDER — TERBINAFINE HCL 250 MG PO TABS: 250.0000 mg | ORAL_TABLET | Freq: Every day | ORAL | 0 refills | Status: DC

## 2024-07-29 MED ORDER — MUPIROCIN 2 % EX OINT: 1.0000 | TOPICAL_OINTMENT | Freq: Two times a day (BID) | CUTANEOUS | 0 refills | Status: AC

## 2024-07-29 MED ORDER — HYDROXYZINE PAMOATE 25 MG PO CAPS: 25.0000 mg | ORAL_CAPSULE | Freq: Three times a day (TID) | ORAL | 0 refills | Status: AC | PRN

## 2024-07-29 NOTE — Telephone Encounter (Signed)
 Patient having worsening L flank pain.  She requested a sooner appt than December.  Patient scheduled with MD on 09/22 and advised to have US  done prior to appt. Message will be sent to MD to see if KUB is needed prior to visit and patient aware that I will confirm KUB over her mychart.  Patient provided with centralized scheduling number to set up US  appt.

## 2024-07-29 NOTE — Progress Notes (Signed)
 Virtual Visit Consent   [name], you are scheduled for a virtual visit with a [facility] provider today. Just as with appointments in the office, your consent must be obtained to participate. Your consent will be active for this visit and any virtual visit you may have with one of our providers in the next 365 days. If you have a MyChart account, a copy of this consent can be sent to you electronically.  As this is a virtual visit, video technology does not allow for your provider to perform a traditional examination. This may limit your provider's ability to fully assess your condition. If your provider identifies any concerns that need to be evaluated in person or the need to arrange testing (such as labs, EKG, etc.), we will make arrangements to do so. Although advances in technology are sophisticated, we cannot ensure that it will always work on either your end or our end. If the connection with a video visit is poor, the visit may have to be switched to a telephone visit. With either a video or telephone visit, we are not always able to ensure that we have a secure connection.  By engaging in this virtual visit, you consent to the provision of healthcare and authorize for your insurance to be billed (if applicable) for the services provided during this visit. Depending on your insurance coverage, you may receive a charge related to this service.  I need to obtain your verbal consent now. Are you willing to proceed with your visit today? [name] has provided verbal consent on 07/29/2024 for a virtual visit (video or telephone). [name], PA-C  Date: 07/29/2024 5:37 PM   Virtual Visit via Video Note   I, [name], connected with  [name]  ([id number], 1966/08/09) on 07/29/24 at  4:15 PM EDT by a video-enabled telemedicine application and verified that I am speaking with the correct person using two identifiers.  Location: Patient: Virtual Visit Location  Patient: Home Provider: Virtual Visit Location Provider: Home Office   I discussed the limitations of evaluation and management by telemedicine and the availability of in person appointments. The patient expressed understanding and agreed to proceed.    History of Present Illness: [name] is a 58 y.o. who identifies as a female who was assigned female at birth, and is being seen today for itching on tops of feet. She reports this has been ongoing for a while and she has even seen her PCP. Originally, when she saw her PCP there was an area only on the right foot between the 2nd and 3rd toes. It was felt that it may be secondary to tinea, so she was given Lotrisone  cream. She reports it is not improving there and now the tops of her feet are itching intensely. She reports it is more like a crawling sensation in her feet. There is no rash noted on the feet. There is an open sore on the top of each foot from her scratching. She has been putting Neosporin on the areas. She does have T2DM with Neuropathy and is on Gabapentin  600mg  TID.    Problems:  Patient Active Problem List   Diagnosis Date Noted   Urinary urgency 06/26/2024   Tinea pedis of right foot 06/26/2024   Allergic dermatitis 03/02/2024   Right-sided chest wall pain 02/14/2024   Mass of upper outer quadrant of right breast 02/05/2024   Encounter for well woman exam with routine gynecological exam 02/05/2024  Family history of breast cancer in first degree relative 02/05/2024   Rash and nonspecific skin eruption 01/13/2024   Skin infection 12/27/2023   B12 deficiency 06/17/2023   Long-term (current) use of injectable non-insulin  antidiabetic drugs 05/30/2023   Epidermoid cyst of skin of scalp 04/15/2023   Cervical spinal stenosis 01/30/2023   Nodule of finger of right hand 01/22/2023   Acute idiopathic gout 01/01/2023   Fecal incontinence 01/01/2023   Vertigo 08/09/2022   Chronic mucoid otitis media of left ear 05/16/2022    Primary insomnia 05/09/2022   ASCUS of cervix with negative high risk HPV 01/16/2022   Spondylolisthesis of lumbar region 12/05/2021   Spinal stenosis of lumbar region with neurogenic claudication 10/25/2021   Chronic bilateral low back pain with left-sided sciatica 10/18/2021   Mid back pain on right side 10/31/2020   GERD (gastroesophageal reflux disease) 03/09/2020   Other intervertebral disc degeneration, lumbar region 05/24/2019   OSA (obstructive sleep apnea) 12/08/2014   Depression 07/15/2013   Dyspnea 11/12/2012   Headache 10/14/2012   Neuropathy 10/14/2012   Essential hypertension, benign 05/10/2012   Diabetes mellitus (HCC) 09/29/2008   Mixed hyperlipidemia 09/29/2008   Class 2 severe obesity due to excess calories with serious comorbidity and body mass index (BMI) of 38.0 to 38.9 in adult [facility]) 08/31/2008   Recurrent nephrolithiasis 08/31/2008    Allergies:  Allergies  Allergen Reactions   Ciprofloxacin  Hives and Swelling   Penicillins Anaphylaxis and Rash   Codeine Other (See Comments)    had hallicunations   Morphine  Nausea And Vomiting and Other (See Comments)    recieved in ED due to [facility] and had multple doses - gave visual hallucinations and vomitting.   Sulfonamide Derivatives Other (See Comments)    REACTION: Unsure - childhood allergy   Vancomycin  Itching   Medications:  Current Outpatient Medications:    hydrOXYzine  (VISTARIL ) 25 MG capsule, Take 1 capsule (25 mg total) by mouth every 8 (eight) hours as needed., Disp: 30 capsule, Rfl: 0   mupirocin  ointment (BACTROBAN ) 2 %, Apply 1 Application topically 2 (two) times daily., Disp: 22 g, Rfl: 0   terbinafine  (LAMISIL ) 250 MG tablet, Take 1 tablet (250 mg total) by mouth daily., Disp: 14 tablet, Rfl: 0   albuterol  (VENTOLIN  HFA) 108 (90 Base) MCG/ACT inhaler, Inhale 2 puffs into the lungs every 6 (six) hours as needed for wheezing or shortness of breath., Disp: 6.7 g, Rfl: 2   blood glucose meter kit  and supplies, Use up to four times daily as directed., Disp: 1 each, Rfl: 0   citalopram  (CELEXA ) 10 MG tablet, Take 1 tablet (10 mg total) by mouth daily., Disp: 30 tablet, Rfl: 5   cyclobenzaprine  (FLEXERIL ) 10 MG tablet, Take 1 tablet (10 mg total) by mouth 2 (two) times daily as needed for muscle spasms., Disp: 30 tablet, Rfl: 5   Empagliflozin -metFORMIN  HCl (SYNJARDY ) 12.5-500 MG TABS, Take 1 tablet by mouth daily., Disp: 60 tablet, Rfl: 0   furosemide  (LASIX ) 20 MG tablet, Take 20 mg by mouth 2 (two) times daily as needed for edema., Disp: , Rfl:    gabapentin  (NEURONTIN ) 600 MG tablet, Take 1 tablet (600 mg total) by mouth 3 (three) times daily., Disp: 90 tablet, Rfl: 2   glucose blood test strip, Test up to 4 times a day, Disp: 100 each, Rfl: 0   ibuprofen  (ADVIL ) 800 MG tablet, Take 1 tablet (800 mg total) by mouth every 8 (eight) hours as needed., Disp: 30 tablet, Rfl:  0   Lancets (FREESTYLE) lancets, Use to test 4 times daily, Disp: 100 each, Rfl: 0   lisinopril -hydrochlorothiazide  (ZESTORETIC ) 20-12.5 MG tablet, Take 1 tablet by mouth daily., Disp: 90 tablet, Rfl: 3   lubiprostone  (AMITIZA ) 24 MCG capsule, Take 1 capsule (24 mcg total) by mouth 2 (two) times daily with a meal., Disp: 60 capsule, Rfl: 3   meclizine  (ANTIVERT ) 25 MG tablet, Take 1 tablet (25 mg total) by mouth 2 (two) times daily as needed for dizziness., Disp: 30 tablet, Rfl: 0   meloxicam  (MOBIC ) 7.5 MG tablet, Take 1 tablet (7.5 mg total) by mouth daily., Disp: 30 tablet, Rfl: 5   omeprazole  (PRILOSEC) 40 MG capsule, Take 1 capsule (40 mg total) by mouth daily., Disp: 90 capsule, Rfl: 1   ondansetron  (ZOFRAN ) 4 MG tablet, Take 1 tablet (4 mg total) by mouth daily as needed for nausea or vomiting., Disp: 20 tablet, Rfl: 1   oxybutynin  (DITROPAN -XL) 5 MG 24 hr tablet, Take 1 tablet (5 mg total) by mouth at bedtime., Disp: 30 tablet, Rfl: 5   Potassium Citrate  (UROCIT-K  15) 15 MEQ (1620 MG) TBCR, Take 1 tablet by mouth 2  (two) times daily., Disp: 60 tablet, Rfl: 11   promethazine  (PHENERGAN ) 25 MG tablet, TAKE 1 TABLET(25 MG) BY MOUTH EVERY 8 HOURS AS NEEDED FOR NAUSEA OR VOMITING, Disp: 20 tablet, Rfl: 0   simvastatin  (ZOCOR ) 40 MG tablet, Take 1 tablet (40 mg total) by mouth every evening for cholesterol, Disp: 90 tablet, Rfl: 3   tirzepatide  (MOUNJARO ) 2.5 MG/0.5ML Pen, Inject 2.5 mg into the skin once a week., Disp: 6 mL, Rfl: 1   zolpidem  (AMBIEN ) 5 MG tablet, Take 1 tablet (5 mg total) by mouth at bedtime as needed for sleep., Disp: 30 tablet, Rfl: 3  Observations/Objective: Patient is well-developed, well-nourished in no acute distress.  Resting comfortably at home.  Head is normocephalic, atraumatic.  No labored breathing.  Speech is clear and coherent with logical content.  Patient is alert and oriented at baseline.  Excoriated lesions on the tops of both feet with bright red tissue visible. No rash noted on the feet. No surrounding erythema. No discharge.   Assessment and Plan: 1. Itching (Primary) - terbinafine  (LAMISIL ) 250 MG tablet; Take 1 tablet (250 mg total) by mouth daily.  Dispense: 14 tablet; Refill: 0 - hydrOXYzine  (VISTARIL ) 25 MG capsule; Take 1 capsule (25 mg total) by mouth every 8 (eight) hours as needed.  Dispense: 30 capsule; Refill: 0 - mupirocin  ointment (BACTROBAN ) 2 %; Apply 1 Application topically 2 (two) times daily.  Dispense: 22 g; Refill: 0  - Suspect worsening neuropathy since mentioned as a crawling sensation that is causing the itch, however patient is already on Gabapentin  - Will add Hydroxyzine  for the itching - Mupirocin  for the open wounds - Terbinafine  for the potential of tinea between the 2nd and 3rd toes on the right foot - Strict follow up precautions if not healing, itching not relieved, or if any signs of infection develop  Follow Up Instructions: I discussed the assessment and treatment plan with the patient. The patient was provided an opportunity to  ask questions and all were answered. The patient agreed with the plan and demonstrated an understanding of the instructions.  A copy of instructions were sent to the patient via MyChart unless otherwise noted below.    The patient was advised to call back or seek an in-person evaluation if the symptoms worsen or if the condition fails to  improve as anticipated.    [name], PA-C

## 2024-07-29 NOTE — Patient Instructions (Signed)
 [name], thank you for joining Delon [name] Dickinson, PA-C for today's virtual visit.  While this provider is not your primary care provider (PCP), if your PCP is located in our provider database this encounter information will be shared with them immediately following your visit.   A [facility] MyChart account gives you access to today's visit and all your visits, tests, and labs performed at [facility]  click here if you don't have a [facility] MyChart account or go to [email or web address]  Consent: (Patient) [name] provided verbal consent for this virtual visit at the beginning of the encounter.  Current Medications:  Current Outpatient Medications:    hydrOXYzine  (VISTARIL ) 25 MG capsule, Take 1 capsule (25 mg total) by mouth every 8 (eight) hours as needed., Disp: 30 capsule, Rfl: 0   mupirocin  ointment (BACTROBAN ) 2 %, Apply 1 Application topically 2 (two) times daily., Disp: 22 g, Rfl: 0   terbinafine  (LAMISIL ) 250 MG tablet, Take 1 tablet (250 mg total) by mouth daily., Disp: 14 tablet, Rfl: 0   albuterol  (VENTOLIN  HFA) 108 (90 Base) MCG/ACT inhaler, Inhale 2 puffs into the lungs every 6 (six) hours as needed for wheezing or shortness of breath., Disp: 6.7 g, Rfl: 2   blood glucose meter kit and supplies, Use up to four times daily as directed., Disp: 1 each, Rfl: 0   citalopram  (CELEXA ) 10 MG tablet, Take 1 tablet (10 mg total) by mouth daily., Disp: 30 tablet, Rfl: 5   cyclobenzaprine  (FLEXERIL ) 10 MG tablet, Take 1 tablet (10 mg total) by mouth 2 (two) times daily as needed for muscle spasms., Disp: 30 tablet, Rfl: 5   Empagliflozin -metFORMIN  HCl (SYNJARDY ) 12.5-500 MG TABS, Take 1 tablet by mouth daily., Disp: 60 tablet, Rfl: 0   furosemide  (LASIX ) 20 MG tablet, Take 20 mg by mouth 2 (two) times daily as needed for edema., Disp: , Rfl:    gabapentin  (NEURONTIN ) 600 MG tablet, Take 1 tablet (600 mg total) by mouth 3 (three) times daily., Disp: 90  tablet, Rfl: 2   glucose blood test strip, Test up to 4 times a day, Disp: 100 each, Rfl: 0   ibuprofen  (ADVIL ) 800 MG tablet, Take 1 tablet (800 mg total) by mouth every 8 (eight) hours as needed., Disp: 30 tablet, Rfl: 0   Lancets (FREESTYLE) lancets, Use to test 4 times daily, Disp: 100 each, Rfl: 0   lisinopril -hydrochlorothiazide  (ZESTORETIC ) 20-12.5 MG tablet, Take 1 tablet by mouth daily., Disp: 90 tablet, Rfl: 3   lubiprostone  (AMITIZA ) 24 MCG capsule, Take 1 capsule (24 mcg total) by mouth 2 (two) times daily with a meal., Disp: 60 capsule, Rfl: 3   meclizine  (ANTIVERT ) 25 MG tablet, Take 1 tablet (25 mg total) by mouth 2 (two) times daily as needed for dizziness., Disp: 30 tablet, Rfl: 0   meloxicam  (MOBIC ) 7.5 MG tablet, Take 1 tablet (7.5 mg total) by mouth daily., Disp: 30 tablet, Rfl: 5   omeprazole  (PRILOSEC) 40 MG capsule, Take 1 capsule (40 mg total) by mouth daily., Disp: 90 capsule, Rfl: 1   ondansetron  (ZOFRAN ) 4 MG tablet, Take 1 tablet (4 mg total) by mouth daily as needed for nausea or vomiting., Disp: 20 tablet, Rfl: 1   oxybutynin  (DITROPAN -XL) 5 MG 24 hr tablet, Take 1 tablet (5 mg total) by mouth at bedtime., Disp: 30 tablet, Rfl: 5   Potassium Citrate  (UROCIT-K  15) 15 MEQ (1620 MG) TBCR, Take 1 tablet by mouth 2 (two) times daily., Disp:  60 tablet, Rfl: 11   promethazine  (PHENERGAN ) 25 MG tablet, TAKE 1 TABLET(25 MG) BY MOUTH EVERY 8 HOURS AS NEEDED FOR NAUSEA OR VOMITING, Disp: 20 tablet, Rfl: 0   simvastatin  (ZOCOR ) 40 MG tablet, Take 1 tablet (40 mg total) by mouth every evening for cholesterol, Disp: 90 tablet, Rfl: 3   tirzepatide  (MOUNJARO ) 2.5 MG/0.5ML Pen, Inject 2.5 mg into the skin once a week., Disp: 6 mL, Rfl: 1   zolpidem  (AMBIEN ) 5 MG tablet, Take 1 tablet (5 mg total) by mouth at bedtime as needed for sleep., Disp: 30 tablet, Rfl: 3   Medications ordered in this encounter:  Meds ordered this encounter  Medications   terbinafine  (LAMISIL ) 250 MG tablet     Sig: Take 1 tablet (250 mg total) by mouth daily.    Dispense:  14 tablet    Refill:  0    Supervising Provider:   LAMPTEY, PHILIP O [[id number]]   hydrOXYzine  (VISTARIL ) 25 MG capsule    Sig: Take 1 capsule (25 mg total) by mouth every 8 (eight) hours as needed.    Dispense:  30 capsule    Refill:  0    Supervising Provider:   LAMPTEY, PHILIP O [[id number]]   mupirocin  ointment (BACTROBAN ) 2 %    Sig: Apply 1 Application topically 2 (two) times daily.    Dispense:  22 g    Refill:  0    Supervising Provider:   BLAISE ALEENE KIDD [[id number]]     *If you need refills on other medications prior to your next appointment, please contact your pharmacy*  Follow-Up: Call back or seek an in-person evaluation if the symptoms worsen or if the condition fails to improve as anticipated.  [facility] [phone]   If you have been instructed to have an in-person evaluation today at a local Urgent Care facility, please use the link below. It will take you to a list of all of our available [facility], including address, phone number and hours of operation. Please do not delay care.  [facility]  If you or a family member do not have a primary care provider, use the link below to schedule a visit and establish care. When you choose a [facility] primary care physician or advanced practice provider, you gain a long-term partner in health. Find a Primary Care Provider  Learn more about [facility]'s in-office and virtual care options: [facility] - Get Care Now

## 2024-07-30 NOTE — Telephone Encounter (Unsigned)
 Copied from CRM [id number]. Topic: Clinical - Prescription Issue >> Jul 30, 2024 10:23 AM Zebedee SAUNDERS wrote: Reason for CRM: Pt wants to know if medication can be call in for foot pain. [id number] - [place], [place] - [address]  [place] [address] Phone: [phone] Fax: [phone]

## 2024-07-31 ENCOUNTER — Ambulatory Visit: Admitting: Internal Medicine

## 2024-08-03 ENCOUNTER — Ambulatory Visit (HOSPITAL_COMMUNITY)
Admission: RE | Admit: 2024-08-03 | Discharge: 2024-08-03 | Disposition: A | Discharge status: Home / Self Care | Source: Ambulatory Visit | Attending: Urology | Admitting: Urology

## 2024-08-03 DIAGNOSIS — N200 Calculus of kidney: Secondary | ICD-10-CM | POA: Insufficient documentation

## 2024-08-03 DIAGNOSIS — Z0389 Encounter for observation for other suspected diseases and conditions ruled out: Secondary | ICD-10-CM | POA: Diagnosis not present

## 2024-08-04 ENCOUNTER — Ambulatory Visit (HOSPITAL_COMMUNITY)

## 2024-08-07 ENCOUNTER — Ambulatory Visit (HOSPITAL_COMMUNITY)
Admission: RE | Admit: 2024-08-07 | Discharge: 2024-08-07 | Disposition: A | Discharge status: Home / Self Care | Source: Ambulatory Visit | Attending: Urology | Admitting: Urology

## 2024-08-07 DIAGNOSIS — N200 Calculus of kidney: Secondary | ICD-10-CM | POA: Insufficient documentation

## 2024-08-07 DIAGNOSIS — I878 Other specified disorders of veins: Secondary | ICD-10-CM | POA: Diagnosis not present

## 2024-08-11 ENCOUNTER — Telehealth: Payer: Self-pay

## 2024-08-11 ENCOUNTER — Ambulatory Visit: Payer: Self-pay | Admitting: Urology

## 2024-08-11 NOTE — Telephone Encounter (Signed)
 Return call to patient and pt state's she is hurting to the point where she feels sick. Patient is aware a message will be sent to Dr. Sherrilee.  Pt voiced understanding

## 2024-08-11 NOTE — Telephone Encounter (Signed)
 Patient needing results from x-ray. Having severe pain.  Please advise.  Call:  [phone]

## 2024-08-14 ENCOUNTER — Encounter: Payer: Self-pay | Admitting: Emergency Medicine

## 2024-08-14 ENCOUNTER — Ambulatory Visit: Payer: Self-pay

## 2024-08-14 ENCOUNTER — Ambulatory Visit
Admission: EM | Admit: 2024-08-14 | Discharge: 2024-08-14 | Disposition: A | Discharge status: Home / Self Care | Attending: Family Medicine | Admitting: Family Medicine

## 2024-08-14 DIAGNOSIS — L309 Dermatitis, unspecified: Secondary | ICD-10-CM | POA: Diagnosis not present

## 2024-08-14 MED ORDER — CHLORHEXIDINE GLUCONATE 4 % EX SOLN: Freq: Every day | CUTANEOUS | 0 refills | Status: AC | PRN

## 2024-08-14 MED ORDER — CLOBETASOL PROPIONATE 0.05 % EX OINT: 1.0000 | TOPICAL_OINTMENT | Freq: Two times a day (BID) | CUTANEOUS | 0 refills | Status: AC

## 2024-08-14 NOTE — ED Triage Notes (Signed)
 Rash on both feet x 1 month.

## 2024-08-14 NOTE — Telephone Encounter (Signed)
 FYI Only or Action Required?: FYI only for provider.  Patient was last seen in primary care on 07/29/2024 by Vivienne Delon HERO, PA-C.  Called Nurse Triage reporting itching.  Symptoms began about a month ago.  Interventions attempted: Nothing.  Symptoms are: unchanged.  Triage Disposition: See PCP When Office is Open (Within 3 Days)  Patient/caregiver understands and will follow disposition?: Yes Reason for Disposition  [1] MODERATE-SEVERE local itching (i.e., interferes with work, school, activities) AND [2] not improved after 24 hours of hydrocortisone  cream  Answer Assessment - Initial Assessment Questions 1. DESCRIPTION: Describe the itching you are having. Where is it located?     Both feet, states caused scratch marks, feels like skin is crawling 2. SEVERITY: How bad is it?      severe 3. SCRATCHING: Are there any scratch marks? Bleeding?     yes 4. ONSET: When did the itching begin? (e.g., minutes, hours, days ago)     Over a month ago, happens on and off 5. CAUSE: What do you think is causing the itching?      unknown 6. OTHER SYMPTOMS: Do you have any other symptoms? (e.g., fever, rash)     denies 7. PREGNANCY: Is there any chance you are pregnant? When was your last menstrual period?     na  Protocols used: Itching - Localized-A-AH

## 2024-08-14 NOTE — Telephone Encounter (Signed)
 1st attempt. No answer, left voicemail for patient to return call for nurse triage.     Copied from CRM [id number]. Topic: Clinical - Medical Advice >> Aug 14, 2024  8:54 AM Edsel HERO wrote: Patient states that her feet have been very itchy for several weeks and she has created sores by scratching. No appointments in office until 9/16. Offered appointments at other locations for today but patient would rather be seen in her primary office. Please advise.

## 2024-08-14 NOTE — Discharge Instructions (Signed)
 Clean the areas at least once a day with the Hibiclens  solution to help prevent infection.  Apply the clobetasol  ointment and try to keep the areas covered.  Follow-up for worsening or unresolving symptoms

## 2024-08-14 NOTE — Telephone Encounter (Signed)
 2nd attempt, no answer. Left voicemail for patient to return call for nurse triage.

## 2024-08-14 NOTE — ED Provider Notes (Signed)
 [facility]    CSN: [id number] Arrival date & time: 08/14/24  1246      History   Chief Complaint No chief complaint on file.   HPI [name] is a 58 y.o. female.   Patient presenting today with bilateral foot rash and intense itching for the past month.  States she gets itchy bumps and she scratches them until they bleed and blister all over the tops of her feet.  Denies new soaps or exposures, new shoes or socks, outdoor exposures, new pets.  Does have a history of diabetes and peripheral neuropathy on gabapentin  but states she has never had these issues before.  So far not trying anything over-the-counter for symptoms.    Past Medical History:  Diagnosis Date   Diabetes mellitus    insulin  pump 2013   Dysphagia 03/09/2020   Epidural hematoma (HCC) 12/14/2021   GERD (gastroesophageal reflux disease)    History of kidney stones    Hypercholesteremia    Hypertension    Incarcerated umbilical hernia 03/25/2014   Kidney stone    Kidney stones 08/31/2008   Qualifier: Diagnosis of  By: Karren MD, Cornelius     Neuropathy    Obesity    Palpitations    Shortness of breath    Vertigo     Patient Active Problem List   Diagnosis Date Noted   Urinary urgency 06/26/2024   Tinea pedis of right foot 06/26/2024   Allergic dermatitis 03/02/2024   Right-sided chest wall pain 02/14/2024   Mass of upper outer quadrant of right breast 02/05/2024   Encounter for well woman exam with routine gynecological exam 02/05/2024   Family history of breast cancer in first degree relative 02/05/2024   Rash and nonspecific skin eruption 01/13/2024   Skin infection 12/27/2023   B12 deficiency 06/17/2023   Long-term (current) use of injectable non-insulin  antidiabetic drugs 05/30/2023   Epidermoid cyst of skin of scalp 04/15/2023   Cervical spinal stenosis 01/30/2023   Nodule of finger of right hand 01/22/2023   Acute idiopathic gout 01/01/2023   Fecal incontinence  01/01/2023   Vertigo 08/09/2022   Chronic mucoid otitis media of left ear 05/16/2022   Primary insomnia 05/09/2022   ASCUS of cervix with negative high risk HPV 01/16/2022   Spondylolisthesis of lumbar region 12/05/2021   Spinal stenosis of lumbar region with neurogenic claudication 10/25/2021   Chronic bilateral low back pain with left-sided sciatica 10/18/2021   Mid back pain on right side 10/31/2020   GERD (gastroesophageal reflux disease) 03/09/2020   Other intervertebral disc degeneration, lumbar region 05/24/2019   OSA (obstructive sleep apnea) 12/08/2014   Depression 07/15/2013   Dyspnea 11/12/2012   Headache 10/14/2012   Neuropathy 10/14/2012   Essential hypertension, benign 05/10/2012   Diabetes mellitus (HCC) 09/29/2008   Mixed hyperlipidemia 09/29/2008   Class 2 severe obesity due to excess calories with serious comorbidity and body mass index (BMI) of 38.0 to 38.9 in adult [facility]) 08/31/2008   Recurrent nephrolithiasis 08/31/2008    Past Surgical History:  Procedure Laterality Date   BIOPSY  06/23/2020   Procedure: BIOPSY;  Surgeon: Shaaron Lamar HERO, MD;  [facility]: [facility];  Service: Endoscopy;;   CARDIAC CATHETERIZATION  12/11/2012   normal coronary arteries   CESAREAN SECTION  1994;2000   [place]   CHOLECYSTECTOMY  1992   COLONOSCOPY WITH PROPOFOL  N/A 06/23/2020   large amount of stool in entire colon, prep inadequate.    COLONOSCOPY WITH PROPOFOL  N/A 01/03/2024  Procedure: COLONOSCOPY WITH PROPOFOL ;  Surgeon: Eartha Angelia Sieving, MD;  [facility]: [facility];  Service: Gastroenterology;  Laterality: N/A;  2:00pm;asa 1   CYSTOSCOPY W/ URETERAL STENT PLACEMENT  05/08/2012   Procedure: CYSTOSCOPY WITH RETROGRADE PYELOGRAM/URETERAL STENT PLACEMENT;  Surgeon: Emery LILLETTE Blaze, MD;  Location: [facility];  Service: Urology;  Laterality: Right;   CYSTOSCOPY WITH RETROGRADE PYELOGRAM, URETEROSCOPY AND STENT PLACEMENT Right 05/04/2020   Procedure: CYSTOSCOPY WITH RIGHT  RETROGRADE PYELOGRAM, RIGHT URETEROSCOPY AND RIGHT URETERAL STENT EXCHANGE;  Surgeon: Sherrilee Belvie CROME, MD;  Location: [facility];  Service: Urology;  Laterality: Right;   CYSTOSCOPY WITH RETROGRADE PYELOGRAM, URETEROSCOPY AND STENT PLACEMENT Left 08/08/2020   Procedure: CYSTOSCOPY WITH RETROGRADE PYELOGRAM, URETEROSCOPY WITH LASER  AND STENT PLACEMENT;  Surgeon: Sherrilee Belvie CROME, MD;  Location: [facility];  Service: Urology;  Laterality: Left;   CYSTOSCOPY WITH RETROGRADE PYELOGRAM, URETEROSCOPY AND STENT PLACEMENT Right 11/08/2020   Procedure: CYSTOSCOPY WITH RIGHT RETROGRADE PYELOGRAM, RIGHT URETEROSCOPY AND STENT PLACEMENT;  Surgeon: Sherrilee Belvie CROME, MD;  Location: [facility];  Service: Urology;  Laterality: Right;   CYSTOSCOPY WITH RETROGRADE PYELOGRAM, URETEROSCOPY AND STENT PLACEMENT Left 12/27/2020   Procedure: CYSTOSCOPY WITH LEFT RETROGRADE PYELOGRAM, LEFT URETEROSCOPY AND LEFT URETERAL STENT PLACEMENT;  Surgeon: Sherrilee Belvie CROME, MD;  Location: [facility];  Service: Urology;  Laterality: Left;   CYSTOSCOPY WITH RETROGRADE PYELOGRAM, URETEROSCOPY AND STENT PLACEMENT Bilateral 03/16/2021   Procedure: CYSTOSCOPY WITH BILATERAL  RETROGRADE PYELOGRAM, BILATERAL URETEROSCOPY AND STENT PLACEMENT ON THE LEFT;  Surgeon: Sherrilee Belvie CROME, MD;  Location: [facility];  Service: Urology;  Laterality: Bilateral;   CYSTOSCOPY WITH RETROGRADE PYELOGRAM, URETEROSCOPY AND STENT PLACEMENT Left 06/28/2022   Procedure: CYSTOSCOPY WITH RETROGRADE PYELOGRAM, URETEROSCOPY AND STENT PLACEMENT;  Surgeon: Sherrilee Belvie CROME, MD;  Location: [facility];  Service: Urology;  Laterality: Left;   CYSTOSCOPY WITH RETROGRADE PYELOGRAM, URETEROSCOPY AND STENT PLACEMENT Bilateral 09/27/2022   Procedure: CYSTOSCOPY WITH BILATERAL RETROGRADE PYELOGRAM, URETEROSCOPY AND STENT PLACEMENT;  Surgeon: Sherrilee Belvie CROME, MD;  Location: [facility];  Service: Urology;  Laterality: Bilateral;  pt knows to arrive at 6:00   CYSTOSCOPY WITH STENT PLACEMENT  Right 02/25/2020   Procedure: CYSTOSCOPY WITH RIGHT RETROGRADE RIGHT STENT PLACEMENT;  Surgeon: Nieves Cough, MD;  Location: [facility];  Service: Urology;  Laterality: Right;   ESOPHAGOGASTRODUODENOSCOPY (EGD) WITH PROPOFOL  N/A 06/23/2020   Normal esophagus, multiple localized erosions were found in gastric antrum s/p biopsy, normal duodenum. Empiric dilation.Reactive gastropathy with erosions, negative H.pylori, no dysplasia.     EXTRACORPOREAL SHOCK WAVE LITHOTRIPSY Right 10/11/2020   Procedure: EXTRACORPOREAL SHOCK WAVE LITHOTRIPSY (ESWL);  Surgeon: Sherrilee Belvie CROME, MD;  Location: [facility];  Service: Urology;  Laterality: Right;   FOOT SURGERY Right March, 2013   [facility]-removal of bone spur   HOLMIUM LASER APPLICATION  05/04/2020   Procedure: HOLMIUM LASER LITHOTRIPSY RIGHT URETERAL CALCULUS;  Surgeon: Sherrilee Belvie CROME, MD;  Location: [facility];  Service: Urology;;   HYSTEROSCOPY WITH THERMACHOICE  04/25/2012   Procedure: HYSTEROSCOPY WITH THERMACHOICE;  Surgeon: Vonn VEAR Inch, MD;  Location: [facility];  Service: Gynecology;  Laterality: N/A;  total therapy time= 11 minutes 42 seconds; 34 ml D5w  in and 34 ml D5w out; temp =87 degrees F   LEFT HEART CATHETERIZATION WITH CORONARY ANGIOGRAM N/A 12/11/2012   Procedure: LEFT HEART CATHETERIZATION WITH CORONARY ANGIOGRAM;  Surgeon: Dorn JINNY Lesches, MD;  Location: [facility];  Service: Cardiovascular;  Laterality: N/A;   LUMBAR WOUND DEBRIDEMENT N/A 12/14/2021   Procedure:  LUMBAR HEMATOMA EVACUATION;  Surgeon: Cheryle Debby LABOR, MD;  Location: [facility];  Service: Neurosurgery;  Laterality: N/A;   MALONEY DILATION N/A 06/23/2020   Procedure: AGAPITO DILATION;  Surgeon: Shaaron Lamar HERO, MD;  [facility];  Service: Endoscopy;  Laterality: N/A;   Sinus sergery  1988   [place]   STONE EXTRACTION WITH BASKET Right 11/08/2020   Procedure: STONE EXTRACTION WITH BASKET;  Surgeon: Sherrilee Belvie CROME, MD;  Location: [facility];  Service: Urology;   Laterality: Right;   TRANSFORAMINAL LUMBAR INTERBODY FUSION (TLIF) WITH PEDICLE SCREW FIXATION 1 LEVEL N/A 12/05/2021   Procedure: Open Lumbar five-Sacral one Laminectomy/Transforaminal Lumbar Interbody Fusion/Posterolateral instrumented fusion;  Surgeon: Cheryle Debby LABOR, MD;  Location: [facility];  Service: Neurosurgery;  Laterality: N/A;  3C   UMBILICAL HERNIA REPAIR N/A 03/25/2014   Procedure: UMBILICAL HERNIA REPAIR;  Surgeon: Elsie GORMAN Holland, MD;  Location: [facility];  Service: General;  Laterality: N/A;   uterine ablation      OB History     Gravida  2   Para  2   Term  2   Preterm      AB      Living  2      SAB      IAB      Ectopic      Multiple      Live Births  2            Home Medications    Prior to Admission medications   Medication Sig Start Date End Date Taking? Authorizing Provider  chlorhexidine  (HIBICLENS ) 4 % external liquid Apply topically daily as needed. 08/14/24  Yes Stuart Vernell Norris, PA-C  clobetasol  ointment (TEMOVATE ) 0.05 % Apply 1 Application topically 2 (two) times daily. 08/14/24  Yes Stuart Vernell Norris, PA-C  albuterol  (VENTOLIN  HFA) 108 [id number] Base) MCG/ACT inhaler Inhale 2 puffs into the lungs every 6 (six) hours as needed for wheezing or shortness of breath. 02/28/24   Tobie Suzzane POUR, MD  blood glucose meter kit and supplies Use up to four times daily as directed. 08/31/21   Elnor Fairy HERO, NP  citalopram  (CELEXA ) 10 MG tablet Take 1 tablet (10 mg total) by mouth daily. 06/26/24   Tobie Suzzane POUR, MD  cyclobenzaprine  (FLEXERIL ) 10 MG tablet Take 1 tablet (10 mg total) by mouth 2 (two) times daily as needed for muscle spasms. 05/15/24   McKenzie, Belvie CROME, MD  Empagliflozin -metFORMIN  HCl (SYNJARDY ) 12.5-500 MG TABS Take 1 tablet by mouth daily. 05/14/24   Tobie Suzzane POUR, MD  furosemide  (LASIX ) 20 MG tablet Take 20 mg by mouth 2 (two) times daily as needed for edema.    [provider]  gabapentin  (NEURONTIN ) 600 MG tablet  Take 1 tablet (600 mg total) by mouth 3 (three) times daily. 07/24/24   Tobie Suzzane POUR, MD  glucose blood test strip Test up to 4 times a day 10/02/21   Tobie Suzzane POUR, MD  hydrOXYzine  (VISTARIL ) 25 MG capsule Take 1 capsule (25 mg total) by mouth every 8 (eight) hours as needed. 07/29/24   Vivienne Delon HERO, PA-C  ibuprofen  (ADVIL ) 800 MG tablet Take 1 tablet (800 mg total) by mouth every 8 (eight) hours as needed. 03/19/24   Zarwolo, Gloria, FNP  Lancets (FREESTYLE) lancets Use to test 4 times daily 08/31/21   Elnor Fairy HERO, NP  lisinopril -hydrochlorothiazide  (ZESTORETIC ) 20-12.5 MG tablet Take 1 tablet by mouth daily. 01/24/24   Tobie Suzzane POUR, MD  lubiprostone  (AMITIZA ) 24 MCG capsule Take 1 capsule (24 mcg total) by mouth 2 (two) times daily with a meal. 03/17/24   Carlan, Chelsea L, NP  meclizine  (ANTIVERT ) 25 MG tablet Take 1 tablet (25 mg total) by mouth 2 (two) times daily as needed for dizziness. 10/24/23   Tobie Suzzane POUR, MD  meloxicam  (MOBIC ) 7.5 MG tablet Take 1 tablet (7.5 mg total) by mouth daily. 02/24/24   Margrette Taft BRAVO, MD  mupirocin  ointment (BACTROBAN ) 2 % Apply 1 Application topically 2 (two) times daily. 07/29/24   Vivienne Delon HERO, PA-C  omeprazole  (PRILOSEC) 40 MG capsule Take 1 capsule (40 mg total) by mouth daily. 04/14/24   Tobie Suzzane POUR, MD  ondansetron  (ZOFRAN ) 4 MG tablet Take 1 tablet (4 mg total) by mouth daily as needed for nausea or vomiting. 02/27/24   Del Wilhelmena Lloyd Sola, FNP  oxybutynin  (DITROPAN -XL) 5 MG 24 hr tablet Take 1 tablet (5 mg total) by mouth at bedtime. 06/29/24   Tobie Suzzane POUR, MD  Potassium Citrate  (UROCIT-K  15) 15 MEQ (1620 MG) TBCR Take 1 tablet by mouth 2 (two) times daily. 05/15/24   McKenzie, Belvie CROME, MD  promethazine  (PHENERGAN ) 25 MG tablet TAKE 1 TABLET(25 MG) BY MOUTH EVERY 8 HOURS AS NEEDED FOR NAUSEA OR VOMITING 09/04/22   Melvenia Manus BRAVO, MD  simvastatin  (ZOCOR ) 40 MG tablet Take 1 tablet (40 mg total) by mouth every evening  for cholesterol 01/24/24   Tobie Suzzane POUR, MD  terbinafine  (LAMISIL ) 250 MG tablet Take 1 tablet (250 mg total) by mouth daily. 07/29/24   Vivienne Delon HERO, PA-C  tirzepatide  (MOUNJARO ) 2.5 MG/0.5ML Pen Inject 2.5 mg into the skin once a week. 05/06/24   Nida, Gebreselassie W, MD  zolpidem  (AMBIEN ) 5 MG tablet Take 1 tablet (5 mg total) by mouth at bedtime as needed for sleep. 04/14/24   Tobie Suzzane POUR, MD    Family History Family History  Problem Relation Age of Onset   Breast cancer Mother    Hypertension Mother    Cancer Mother    Diabetes Mother    Stroke Mother    Alzheimer's disease Mother    Diabetes Father    Breast cancer Sister    Cancer Paternal Uncle    Breast cancer Maternal Grandmother    Breast cancer Paternal Grandmother    Depression Paternal Grandmother    Depression Paternal Grandfather    Heart disease Other    Cancer Other    Diabetes Other    Achalasia Other    Anesthesia problems Neg Hx    Hypotension Neg Hx    Malignant hyperthermia Neg Hx    Pseudochol deficiency Neg Hx    Colon cancer Neg Hx    Colon polyps Neg Hx     Social History Social History   Tobacco Use   Smoking status: Never   Smokeless tobacco: Never  Vaping Use   Vaping status: Never Used  Substance Use Topics   Alcohol use: No   Drug use: No     Allergies   Ciprofloxacin , Penicillins, Codeine, Morphine , Sulfonamide derivatives, and Vancomycin    Review of Systems Review of Systems Per HPI  Physical Exam Triage Vital Signs ED Triage Vitals  Encounter Vitals Group     BP 08/14/24 1332 117/72     Girls Systolic BP Percentile --      Girls Diastolic BP Percentile --      Boys Systolic BP Percentile --  Boys Diastolic BP Percentile --      Pulse Rate 08/14/24 1332 80     Resp 08/14/24 1332 18     Temp 08/14/24 1332 98.7 F (37.1 C)     Temp Source 08/14/24 1332 Oral     SpO2 08/14/24 1332 91 %     Weight --      Height --      Head Circumference --       Peak Flow --      Pain Score 08/14/24 1333 9     Pain Loc --      Pain Education --      Exclude from Growth Chart --    No data found.  Updated Vital Signs BP 117/72 (BP Location: Right Arm)   Pulse 80   Temp 98.7 F (37.1 C) (Oral)   Resp 18   LMP 07/16/2012   SpO2 91%   Visual Acuity Right Eye Distance:   Left Eye Distance:   Bilateral Distance:    Right Eye Near:   Left Eye Near:    Bilateral Near:     Physical Exam Vitals and nursing note reviewed.  Constitutional:      Appearance: Normal appearance. She is not ill-appearing.  HENT:     Head: Atraumatic.  Eyes:     Extraocular Movements: Extraocular movements intact.     Conjunctiva/sclera: Conjunctivae normal.  Cardiovascular:     Rate and Rhythm: Normal rate.  Pulmonary:     Effort: Pulmonary effort is normal.  Musculoskeletal:        General: Normal range of motion.     Cervical back: Normal range of motion and neck supple.  Skin:    General: Skin is warm.     Comments: Erythematous papular lesions sporadic across dorsal feet bilaterally with some excoriations, ulcerations with clear drainage scattered throughout  Neurological:     Mental Status: She is alert and oriented to person, place, and time.     Comments: Bilateral feet neurovascularly intact  Psychiatric:        Mood and Affect: Mood normal.        Thought Content: Thought content normal.        Judgment: Judgment normal.      UC Treatments / Results  Labs (all labs ordered are listed, but only abnormal results are displayed) Labs Reviewed - No data to display  EKG   Radiology No results found.  Procedures Procedures (including critical care time)  Medications Ordered in UC Medications - No data to display  Initial Impression / Assessment and Plan / UC Course  I have reviewed the triage vital signs and the nursing notes.  Pertinent labs & imaging results that were available during my care of the patient were reviewed by me  and considered in my medical decision making (see chart for details).     Possibly an allergic dermatitis, unclear etiology of the rash initially but will treat with clobetasol  ointment, Hibiclens  once or twice a day to keep the areas from getting infected and cover up the open areas as often as possible.  Return for worsening symptoms.  Final Clinical Impressions(s) / UC Diagnoses   Final diagnoses:  Foot dermatitis     Discharge Instructions      Clean the areas at least once a day with the Hibiclens  solution to help prevent infection.  Apply the clobetasol  ointment and try to keep the areas covered.  Follow-up for worsening or unresolving symptoms  ED Prescriptions     Medication Sig Dispense Auth. Provider   clobetasol  ointment (TEMOVATE ) 0.05 % Apply 1 Application topically 2 (two) times daily. 60 g Stuart Vernell Norris, PA-C   chlorhexidine  (HIBICLENS ) 4 % external liquid Apply topically daily as needed. 236 mL Stuart Vernell Norris, [place]      PDMP not reviewed this encounter.   Stuart Vernell Norris, [place] 08/14/24 1628

## 2024-08-25 ENCOUNTER — Other Ambulatory Visit: Payer: Self-pay

## 2024-08-25 ENCOUNTER — Other Ambulatory Visit (HOSPITAL_COMMUNITY): Payer: Self-pay

## 2024-08-25 ENCOUNTER — Other Ambulatory Visit: Payer: Self-pay | Admitting: Orthopedic Surgery

## 2024-08-25 ENCOUNTER — Other Ambulatory Visit: Payer: Self-pay | Admitting: Internal Medicine

## 2024-08-25 DIAGNOSIS — R200 Anesthesia of skin: Secondary | ICD-10-CM

## 2024-08-25 DIAGNOSIS — G5601 Carpal tunnel syndrome, right upper limb: Secondary | ICD-10-CM

## 2024-08-25 MED ORDER — MELOXICAM 7.5 MG PO TABS
7.5000 mg | ORAL_TABLET | Freq: Every day | ORAL | 5 refills | Status: DC
  Filled 2024-08-25: qty 30, 30d supply, fill #0
  Filled 2024-09-18: qty 30, 30d supply, fill #1
  Filled 2024-10-13: qty 30, 30d supply, fill #2
  Filled 2024-10-30: qty 30, 30d supply, fill #3
  Filled 2024-11-27: qty 90, 90d supply, fill #3

## 2024-08-25 MED ORDER — SYNJARDY 12.5-500 MG PO TABS
1.0000 | ORAL_TABLET | Freq: Every day | ORAL | 0 refills | Status: DC
  Filled 2024-08-25: qty 60, 60d supply, fill #0

## 2024-08-26 ENCOUNTER — Encounter: Payer: Self-pay | Admitting: Internal Medicine

## 2024-08-26 ENCOUNTER — Other Ambulatory Visit (HOSPITAL_BASED_OUTPATIENT_CLINIC_OR_DEPARTMENT_OTHER): Payer: Self-pay

## 2024-08-26 ENCOUNTER — Ambulatory Visit (INDEPENDENT_AMBULATORY_CARE_PROVIDER_SITE_OTHER): Admitting: Internal Medicine

## 2024-08-26 ENCOUNTER — Telehealth: Payer: Self-pay

## 2024-08-26 VITALS — BP 105/67 | HR 80 | Ht <= 58 in | Wt 168.0 lb

## 2024-08-26 DIAGNOSIS — J0111 Acute recurrent frontal sinusitis: Secondary | ICD-10-CM | POA: Insufficient documentation

## 2024-08-26 DIAGNOSIS — R21 Rash and other nonspecific skin eruption: Secondary | ICD-10-CM

## 2024-08-26 DIAGNOSIS — R0981 Nasal congestion: Secondary | ICD-10-CM | POA: Diagnosis not present

## 2024-08-26 DIAGNOSIS — B353 Tinea pedis: Secondary | ICD-10-CM

## 2024-08-26 MED ORDER — TERBINAFINE HCL 250 MG PO TABS
250.0000 mg | ORAL_TABLET | Freq: Every day | ORAL | 0 refills | Status: DC
  Filled 2024-08-26: qty 28, 28d supply, fill #0

## 2024-08-26 MED ORDER — AZITHROMYCIN 250 MG PO TABS
ORAL_TABLET | ORAL | 0 refills | Status: AC
  Filled 2024-08-26: qty 6, 5d supply, fill #0

## 2024-08-26 NOTE — Progress Notes (Signed)
 Acute Office Visit  Subjective:    Patient ID: [name], female    DOB: 04-Nov-1966, 58 y.o.   MRN: [id number]  Chief Complaint  Patient presents with   Foot Problem    Pt reports sx of skin tag between her toes.    Nasal Congestion    Took covid test was neg on Monday and today. Has been feeling congested since Monday.     HPI Patient is in today for complaint of bilateral foot itching and redness, for the last 3 months.  She has had recurrent itching sensation on the dorsal part of the foot and has had erythematous scabs at times due to intense itching.  She also reports friction rash in between toes of the right foot.  She was recently given terbinafine  for 2 weeks from urgent care, but did not see much improvement.  Of note, she has history of diabetic neuropathy and takes gabapentin  for it.  She reports nasal congestion, postnasal drip, sore throat and sinus pressure related headache for the last 3 days.  Her COVID test was negative at her workplace.  Her flu test was negative today in the office.  She denies any fever or chills currently.  Denies any dyspnea or wheezing currently.  Past Medical History:  Diagnosis Date   Diabetes mellitus    insulin  pump 2013   Dysphagia 03/09/2020   Epidural hematoma (HCC) 12/14/2021   GERD (gastroesophageal reflux disease)    History of kidney stones    Hypercholesteremia    Hypertension    Incarcerated umbilical hernia 03/25/2014   Kidney stone    Kidney stones 08/31/2008   Qualifier: Diagnosis of  By: Karren MD, Cornelius     Neuropathy    Obesity    Palpitations    Shortness of breath    Vertigo     Past Surgical History:  Procedure Laterality Date   BIOPSY  06/23/2020   Procedure: BIOPSY;  Surgeon: Shaaron Lamar HERO, MD;  [facility]: [facility];  Service: Endoscopy;;   CARDIAC CATHETERIZATION  12/11/2012   normal coronary arteries   CESAREAN SECTION  1994;2000   [place]   CHOLECYSTECTOMY  1992   COLONOSCOPY WITH  PROPOFOL  N/A 06/23/2020   large amount of stool in entire colon, prep inadequate.    COLONOSCOPY WITH PROPOFOL  N/A 01/03/2024   Procedure: COLONOSCOPY WITH PROPOFOL ;  Surgeon: Eartha Angelia Sieving, MD;  [facility]: [facility];  Service: Gastroenterology;  Laterality: N/A;  2:00pm;asa 1   CYSTOSCOPY W/ URETERAL STENT PLACEMENT  05/08/2012   Procedure: CYSTOSCOPY WITH RETROGRADE PYELOGRAM/URETERAL STENT PLACEMENT;  Surgeon: Emery LILLETTE Blaze, MD;  Location: [facility];  Service: Urology;  Laterality: Right;   CYSTOSCOPY WITH RETROGRADE PYELOGRAM, URETEROSCOPY AND STENT PLACEMENT Right 05/04/2020   Procedure: CYSTOSCOPY WITH RIGHT RETROGRADE PYELOGRAM, RIGHT URETEROSCOPY AND RIGHT URETERAL STENT EXCHANGE;  Surgeon: Sherrilee Belvie CROME, MD;  Location: [facility];  Service: Urology;  Laterality: Right;   CYSTOSCOPY WITH RETROGRADE PYELOGRAM, URETEROSCOPY AND STENT PLACEMENT Left 08/08/2020   Procedure: CYSTOSCOPY WITH RETROGRADE PYELOGRAM, URETEROSCOPY WITH LASER  AND STENT PLACEMENT;  Surgeon: Sherrilee Belvie CROME, MD;  Location: [facility];  Service: Urology;  Laterality: Left;   CYSTOSCOPY WITH RETROGRADE PYELOGRAM, URETEROSCOPY AND STENT PLACEMENT Right 11/08/2020   Procedure: CYSTOSCOPY WITH RIGHT RETROGRADE PYELOGRAM, RIGHT URETEROSCOPY AND STENT PLACEMENT;  Surgeon: Sherrilee Belvie CROME, MD;  Location: [facility];  Service: Urology;  Laterality: Right;   CYSTOSCOPY WITH RETROGRADE PYELOGRAM, URETEROSCOPY AND STENT PLACEMENT Left 12/27/2020  Procedure: CYSTOSCOPY WITH LEFT RETROGRADE PYELOGRAM, LEFT URETEROSCOPY AND LEFT URETERAL STENT PLACEMENT;  Surgeon: Sherrilee Belvie CROME, MD;  Location: [facility];  Service: Urology;  Laterality: Left;   CYSTOSCOPY WITH RETROGRADE PYELOGRAM, URETEROSCOPY AND STENT PLACEMENT Bilateral 03/16/2021   Procedure: CYSTOSCOPY WITH BILATERAL  RETROGRADE PYELOGRAM, BILATERAL URETEROSCOPY AND STENT PLACEMENT ON THE LEFT;  Surgeon: Sherrilee Belvie CROME, MD;  Location: [facility];  Service: Urology;   Laterality: Bilateral;   CYSTOSCOPY WITH RETROGRADE PYELOGRAM, URETEROSCOPY AND STENT PLACEMENT Left 06/28/2022   Procedure: CYSTOSCOPY WITH RETROGRADE PYELOGRAM, URETEROSCOPY AND STENT PLACEMENT;  Surgeon: Sherrilee Belvie CROME, MD;  Location: [facility];  Service: Urology;  Laterality: Left;   CYSTOSCOPY WITH RETROGRADE PYELOGRAM, URETEROSCOPY AND STENT PLACEMENT Bilateral 09/27/2022   Procedure: CYSTOSCOPY WITH BILATERAL RETROGRADE PYELOGRAM, URETEROSCOPY AND STENT PLACEMENT;  Surgeon: Sherrilee Belvie CROME, MD;  Location: [facility];  Service: Urology;  Laterality: Bilateral;  pt knows to arrive at 6:00   CYSTOSCOPY WITH STENT PLACEMENT Right 02/25/2020   Procedure: CYSTOSCOPY WITH RIGHT RETROGRADE RIGHT STENT PLACEMENT;  Surgeon: Nieves Cough, MD;  Location: [facility];  Service: Urology;  Laterality: Right;   ESOPHAGOGASTRODUODENOSCOPY (EGD) WITH PROPOFOL  N/A 06/23/2020   Normal esophagus, multiple localized erosions were found in gastric antrum s/p biopsy, normal duodenum. Empiric dilation.Reactive gastropathy with erosions, negative H.pylori, no dysplasia.     EXTRACORPOREAL SHOCK WAVE LITHOTRIPSY Right 10/11/2020   Procedure: EXTRACORPOREAL SHOCK WAVE LITHOTRIPSY (ESWL);  Surgeon: Sherrilee Belvie CROME, MD;  Location: [facility];  Service: Urology;  Laterality: Right;   FOOT SURGERY Right March, 2013   [facility]-removal of bone spur   HOLMIUM LASER APPLICATION  05/04/2020   Procedure: HOLMIUM LASER LITHOTRIPSY RIGHT URETERAL CALCULUS;  Surgeon: Sherrilee Belvie CROME, MD;  Location: [facility];  Service: Urology;;   HYSTEROSCOPY WITH THERMACHOICE  04/25/2012   Procedure: HYSTEROSCOPY WITH THERMACHOICE;  Surgeon: Vonn VEAR Inch, MD;  Location: [facility];  Service: Gynecology;  Laterality: N/A;  total therapy time= 11 minutes 42 seconds; 34 ml D5w  in and 34 ml D5w out; temp =87 degrees F   LEFT HEART CATHETERIZATION WITH CORONARY ANGIOGRAM N/A 12/11/2012   Procedure: LEFT HEART CATHETERIZATION WITH CORONARY ANGIOGRAM;   Surgeon: Dorn JINNY Lesches, MD;  Location[facility];  Service: Cardiovascular;  Laterality: N/A;   LUMBAR WOUND DEBRIDEMENT N/A 12/14/2021   Procedure: LUMBAR HEMATOMA EVACUATION;  Surgeon: Cheryle Debby LABOR, MD;  Location: [facility];  Service: Neurosurgery;  Laterality: N/A;   MALONEY DILATION N/A 06/23/2020   Procedure: AGAPITO DILATION;  Surgeon: Shaaron Lamar HERO, MD;  [facility];  Service: Endoscopy;  Laterality: N/A;   Sinus sergery  1988   [place]   STONE EXTRACTION WITH BASKET Right 11/08/2020   Procedure: STONE EXTRACTION WITH BASKET;  Surgeon: Sherrilee Belvie CROME, MD;  Location: [facility];  Service: Urology;  Laterality: Right;   TRANSFORAMINAL LUMBAR INTERBODY FUSION (TLIF) WITH PEDICLE SCREW FIXATION 1 LEVEL N/A 12/05/2021   Procedure: Open Lumbar five-Sacral one Laminectomy/Transforaminal Lumbar Interbody Fusion/Posterolateral instrumented fusion;  Surgeon: Cheryle Debby LABOR, MD;  Location: [facility];  Service: Neurosurgery;  Laterality: N/A;  3C   UMBILICAL HERNIA REPAIR N/A 03/25/2014   Procedure: UMBILICAL HERNIA REPAIR;  Surgeon: Elsie GORMAN Holland, MD;  Location: [facility];  Service: General;  Laterality: N/A;   uterine ablation      Family History  Problem Relation Age of Onset   Breast cancer Mother    Hypertension Mother    Cancer Mother    Diabetes  Mother    Stroke Mother    Alzheimer's disease Mother    Diabetes Father    Breast cancer Sister    Cancer Paternal Uncle    Breast cancer Maternal Grandmother    Breast cancer Paternal Grandmother    Depression Paternal Grandmother    Depression Paternal Grandfather    Heart disease Other    Cancer Other    Diabetes Other    Achalasia Other    Anesthesia problems Neg Hx    Hypotension Neg Hx    Malignant hyperthermia Neg Hx    Pseudochol deficiency Neg Hx    Colon cancer Neg Hx    Colon polyps Neg Hx     Social History   Socioeconomic History   Marital status: Married    Spouse name: Not on file    Number of children: 2   Years of education: college   Highest education level: 12th grade  Occupational History   Occupation: Scientist, research (medical): Audiological scientist: [employer]   Occupation: Lawyer- at [employer]  Tobacco Use   Smoking status: Never   Smokeless tobacco: Never  Vaping Use   Vaping status: Never Used  Substance and Sexual Activity   Alcohol use: No   Drug use: No   Sexual activity: Yes    Partners: Male    Birth control/protection: Post-menopausal    Comment: ablation  Other Topics Concern   Not on file  Social History Narrative   Regular exercise: walks Caffeine use:    Social Drivers of Corporate investment banker Strain: Low Risk  (02/05/2024)   Overall Financial Resource Strain (CARDIA)    Difficulty of Paying Living Expenses: Not hard at all  Food Insecurity: No Food Insecurity (02/05/2024)   Hunger Vital Sign    Worried About Running Out of Food in the Last Year: Never true    Ran Out of Food in the Last Year: Never true  Transportation Needs: No Transportation Needs (02/05/2024)   PRAPARE - Administrator, Civil Service (Medical): No    Lack of Transportation (Non-Medical): No  Physical Activity: Insufficiently Active (02/05/2024)   Exercise Vital Sign    Days of Exercise per Week: 3 days    Minutes of Exercise per Session: 30 min  Stress: No Stress Concern Present (02/05/2024)   [employer] of Occupational Health - Occupational Stress Questionnaire    Feeling of Stress : Only a little  Recent Concern: Stress - Stress Concern Present (12/27/2023)   [employer] of Occupational Health - Occupational Stress Questionnaire    Feeling of Stress : To some extent  Social Connections: Moderately Integrated (02/05/2024)   Social Connection and Isolation Panel    Frequency of Communication with Friends and Family: More than three times a week    Frequency of Social Gatherings with Friends and Family: Three times a week     Attends Religious Services: More than 4 times per year    Active Member of Clubs or Organizations: No    Attends Banker Meetings: Never    Marital Status: Married  Catering manager Violence: Not At Risk (02/05/2024)   Humiliation, Afraid, Rape, and Kick questionnaire    Fear of Current or Ex-Partner: No    Emotionally Abused: No    Physically Abused: No    Sexually Abused: No    Outpatient Medications Prior to Visit  Medication Sig Dispense Refill   albuterol  (VENTOLIN  HFA)  108 (90 Base) MCG/ACT inhaler Inhale 2 puffs into the lungs every 6 (six) hours as needed for wheezing or shortness of breath. 6.7 g 2   blood glucose meter kit and supplies Use up to four times daily as directed. 1 each 0   chlorhexidine  (HIBICLENS ) 4 % external liquid Apply topically daily as needed. 236 mL 0   citalopram  (CELEXA ) 10 MG tablet Take 1 tablet (10 mg total) by mouth daily. 30 tablet 5   clobetasol  ointment (TEMOVATE ) 0.05 % Apply 1 Application topically 2 (two) times daily. 60 g 0   cyclobenzaprine  (FLEXERIL ) 10 MG tablet Take 1 tablet (10 mg total) by mouth 2 (two) times daily as needed for muscle spasms. 30 tablet 5   Empagliflozin -metFORMIN  HCl (SYNJARDY ) 12.5-500 MG TABS Take 1 tablet by mouth daily. 60 tablet 0   furosemide  (LASIX ) 20 MG tablet Take 20 mg by mouth 2 (two) times daily as needed for edema.     gabapentin  (NEURONTIN ) 600 MG tablet Take 1 tablet (600 mg total) by mouth 3 (three) times daily. 90 tablet 2   glucose blood test strip Test up to 4 times a day 100 each 0   hydrOXYzine  (VISTARIL ) 25 MG capsule Take 1 capsule (25 mg total) by mouth every 8 (eight) hours as needed. 30 capsule 0   ibuprofen  (ADVIL ) 800 MG tablet Take 1 tablet (800 mg total) by mouth every 8 (eight) hours as needed. 30 tablet 0   Lancets (FREESTYLE) lancets Use to test 4 times daily 100 each 0   lisinopril -hydrochlorothiazide  (ZESTORETIC ) 20-12.5 MG tablet Take 1 tablet by mouth daily. 90 tablet 3    lubiprostone  (AMITIZA ) 24 MCG capsule Take 1 capsule (24 mcg total) by mouth 2 (two) times daily with a meal. 60 capsule 3   meclizine  (ANTIVERT ) 25 MG tablet Take 1 tablet (25 mg total) by mouth 2 (two) times daily as needed for dizziness. 30 tablet 0   meloxicam  (MOBIC ) 7.5 MG tablet Take 1 tablet (7.5 mg total) by mouth daily. 30 tablet 5   mupirocin  ointment (BACTROBAN ) 2 % Apply 1 Application topically 2 (two) times daily. 22 g 0   omeprazole  (PRILOSEC) 40 MG capsule Take 1 capsule (40 mg total) by mouth daily. 90 capsule 1   ondansetron  (ZOFRAN ) 4 MG tablet Take 1 tablet (4 mg total) by mouth daily as needed for nausea or vomiting. 20 tablet 1   oxybutynin  (DITROPAN -XL) 5 MG 24 hr tablet Take 1 tablet (5 mg total) by mouth at bedtime. 30 tablet 5   Potassium Citrate  (UROCIT-K  15) 15 MEQ (1620 MG) TBCR Take 1 tablet by mouth 2 (two) times daily. 60 tablet 11   promethazine  (PHENERGAN ) 25 MG tablet TAKE 1 TABLET(25 MG) BY MOUTH EVERY 8 HOURS AS NEEDED FOR NAUSEA OR VOMITING 20 tablet 0   simvastatin  (ZOCOR ) 40 MG tablet Take 1 tablet (40 mg total) by mouth every evening for cholesterol 90 tablet 3   tirzepatide  (MOUNJARO ) 2.5 MG/0.5ML Pen Inject 2.5 mg into the skin once a week. 6 mL 1   zolpidem  (AMBIEN ) 5 MG tablet Take 1 tablet (5 mg total) by mouth at bedtime as needed for sleep. 30 tablet 3   terbinafine  (LAMISIL ) 250 MG tablet Take 1 tablet (250 mg total) by mouth daily. 14 tablet 0   No facility-administered medications prior to visit.    Allergies  Allergen Reactions   Ciprofloxacin  Hives and Swelling   Penicillins Anaphylaxis and Rash   Codeine Other (See Comments)  had hallicunations   Morphine  Nausea And Vomiting and Other (See Comments)    recieved in ED due to [facility] and had multple doses - gave visual hallucinations and vomitting.   Sulfonamide Derivatives Other (See Comments)    REACTION: Unsure - childhood allergy   Vancomycin  Itching    Review of Systems   Constitutional:  Positive for fatigue. Negative for chills and fever.  HENT:  Negative for congestion and sore throat.   Eyes:  Negative for pain and discharge.  Respiratory:  Negative for cough and shortness of breath.   Cardiovascular:  Negative for chest pain and palpitations.  Gastrointestinal:  Negative for abdominal pain, nausea and vomiting.  Endocrine: Negative for polydipsia and polyuria.  Genitourinary:  Positive for urgency. Negative for dysuria and hematuria.  Musculoskeletal:  Positive for back pain. Negative for neck pain and neck stiffness.  Skin:  Positive for rash.  Neurological:  Negative for dizziness and weakness.  Psychiatric/Behavioral:  Positive for sleep disturbance. Negative for agitation and behavioral problems.        Objective:    Physical Exam Vitals reviewed.  Constitutional:      General: She is not in acute distress.    Appearance: She is obese. She is not diaphoretic.  HENT:     Head: Normocephalic and atraumatic.     Mouth/Throat:     Mouth: Mucous membranes are moist.     Pharynx: No posterior oropharyngeal erythema.  Eyes:     General: No scleral icterus.    Extraocular Movements: Extraocular movements intact.  Cardiovascular:     Rate and Rhythm: Normal rate and regular rhythm.     Heart sounds: Normal heart sounds. No murmur heard. Pulmonary:     Breath sounds: Normal breath sounds. No wheezing or rales.  Musculoskeletal:     Cervical back: Neck supple. No tenderness.     Right lower leg: No edema.     Left lower leg: No edema.  Skin:    General: Skin is warm.     Findings: Erythema (With scabs over dorsal part of bilateral feet) and lesion (Mild erythema and skin peeling in the interdigital space of 2nd and 3rd toe of right foot) present. No rash.  Neurological:     General: No focal deficit present.     Mental Status: She is alert and oriented to person, place, and time.  Psychiatric:        Mood and Affect: Mood normal.         Behavior: Behavior normal.     BP 105/67   Pulse 80   Ht 4' 8 (1.422 m)   Wt 168 lb (76.2 kg)   LMP 07/16/2012   SpO2 93%   BMI 37.66 kg/m  Wt Readings from Last 3 Encounters:  08/26/24 168 lb (76.2 kg)  06/26/24 168 lb 9.6 oz (76.5 kg)  05/06/24 165 lb 6.4 oz (75 kg)        Assessment & Plan:   Problem List Items Addressed This Visit       Respiratory   Acute recurrent frontal sinusitis   Recent COVID and flu test were negative Since she has history of type II DM and HTN, and she works at nursing home, started empiric azithromycin  Advised to use Flonase for nasal congestion/allergies      Relevant Medications   terbinafine  (LAMISIL ) 250 MG tablet   azithromycin  (ZITHROMAX ) 250 MG tablet   Other Relevant Orders   Veritor Flu A/B Waived  Musculoskeletal and Integument   Rash and nonspecific skin eruption   Chronic, recurrent skin rash of bilateral feet, was initially thought to be allergic dermatitis Did not respond well to clobetasol  or Lotrisone  Referred to dermatology for further evaluation       Relevant Orders   Ambulatory referral to Dermatology   Tinea pedis of both feet - Primary   Advised to keep area clean and dry Has tried Lotrisone  cream for local skin lesion Prescribed oral terbinafine  for 4 weeks Referred to dermatology for further evaluation      Relevant Medications   terbinafine  (LAMISIL ) 250 MG tablet   azithromycin  (ZITHROMAX ) 250 MG tablet     Meds ordered this encounter  Medications   terbinafine  (LAMISIL ) 250 MG tablet    Sig: Take 1 tablet (250 mg total) by mouth daily.    Dispense:  28 tablet    Refill:  0   azithromycin  (ZITHROMAX ) 250 MG tablet    Sig: Take 2 tablets on day 1, then 1 tablet daily on days 2 through 5    Dispense:  6 tablet    Refill:  0     Alexzandria Massman MARLA Blanch, MD

## 2024-08-26 NOTE — Assessment & Plan Note (Signed)
 Recent COVID and flu test were negative Since she has history of type II DM and HTN, and she works at nursing home, started empiric azithromycin  Advised to use Flonase for nasal congestion/allergies

## 2024-08-26 NOTE — Assessment & Plan Note (Signed)
 Chronic, recurrent skin rash of bilateral feet, was initially thought to be allergic dermatitis Did not respond well to clobetasol  or Lotrisone  Referred to dermatology for further evaluation

## 2024-08-26 NOTE — Telephone Encounter (Signed)
 Copied from CRM [id number]. Topic: General - Call Back - No Documentation >> Aug 26, 2024  4:29 PM Rea C wrote: Reason for CRM: Patient just left the office and forgot to ask for a return to work letter. Patient is asking if a return to work letter can be forwarded to her email, if possible.    [email or web address],  [phone] (m)

## 2024-08-26 NOTE — Assessment & Plan Note (Signed)
 Advised to keep area clean and dry Has tried Lotrisone  cream for local skin lesion Prescribed oral terbinafine  for 4 weeks Referred to dermatology for further evaluation

## 2024-08-26 NOTE — Patient Instructions (Addendum)
 Please start taking Terbinafine  as prescribed.  Please apply Lamisil  cream between the toes.

## 2024-08-27 ENCOUNTER — Other Ambulatory Visit (HOSPITAL_BASED_OUTPATIENT_CLINIC_OR_DEPARTMENT_OTHER): Payer: Self-pay

## 2024-08-27 ENCOUNTER — Other Ambulatory Visit: Payer: Self-pay | Admitting: Internal Medicine

## 2024-08-27 ENCOUNTER — Encounter: Payer: Self-pay | Admitting: Internal Medicine

## 2024-08-27 DIAGNOSIS — B3731 Acute candidiasis of vulva and vagina: Secondary | ICD-10-CM

## 2024-08-27 DIAGNOSIS — R110 Nausea: Secondary | ICD-10-CM

## 2024-08-27 MED ORDER — ONDANSETRON HCL 4 MG PO TABS
4.0000 mg | ORAL_TABLET | Freq: Every day | ORAL | 1 refills | Status: AC | PRN
  Filled 2024-08-27: qty 20, 20d supply, fill #0
  Filled 2024-10-30: qty 20, 20d supply, fill #1

## 2024-08-27 MED ORDER — FLUCONAZOLE 150 MG PO TABS
150.0000 mg | ORAL_TABLET | Freq: Once | ORAL | 0 refills | Status: AC
  Filled 2024-08-27: qty 1, 1d supply, fill #0

## 2024-08-27 NOTE — Telephone Encounter (Signed)
Pt informed

## 2024-08-27 NOTE — Telephone Encounter (Signed)
 Spoke to pt sent work note via my chart.  Pt asking if fluconazole  for yeast infection with antibiotic?  Would like a refill on zofran  for nausea medication as well?

## 2024-08-28 ENCOUNTER — Other Ambulatory Visit: Payer: Self-pay

## 2024-08-28 LAB — VERITOR FLU A/B WAIVED
Influenza A: NEGATIVE
Influenza B: NEGATIVE

## 2024-08-31 ENCOUNTER — Ambulatory Visit (HOSPITAL_COMMUNITY)
Admission: RE | Admit: 2024-08-31 | Discharge: 2024-08-31 | Disposition: A | Discharge status: Home / Self Care | Source: Ambulatory Visit | Attending: Urology | Admitting: Urology

## 2024-08-31 ENCOUNTER — Other Ambulatory Visit (HOSPITAL_COMMUNITY): Payer: Self-pay

## 2024-08-31 ENCOUNTER — Ambulatory Visit (INDEPENDENT_AMBULATORY_CARE_PROVIDER_SITE_OTHER): Admitting: Urology

## 2024-08-31 ENCOUNTER — Ambulatory Visit (INDEPENDENT_AMBULATORY_CARE_PROVIDER_SITE_OTHER): Admitting: Gastroenterology

## 2024-08-31 ENCOUNTER — Other Ambulatory Visit (HOSPITAL_BASED_OUTPATIENT_CLINIC_OR_DEPARTMENT_OTHER): Payer: Self-pay

## 2024-08-31 VITALS — BP 110/60 | HR 76

## 2024-08-31 DIAGNOSIS — R109 Unspecified abdominal pain: Secondary | ICD-10-CM

## 2024-08-31 DIAGNOSIS — N200 Calculus of kidney: Secondary | ICD-10-CM

## 2024-08-31 DIAGNOSIS — N2889 Other specified disorders of kidney and ureter: Secondary | ICD-10-CM | POA: Diagnosis not present

## 2024-08-31 MED ORDER — OXYCODONE-ACETAMINOPHEN 5-325 MG PO TABS: 1.0000 | ORAL_TABLET | ORAL | 0 refills | Status: AC | PRN

## 2024-08-31 MED ORDER — CYCLOBENZAPRINE HCL 10 MG PO TABS
10.0000 mg | ORAL_TABLET | Freq: Three times a day (TID) | ORAL | 3 refills | Status: AC | PRN
Start: 2024-08-31 — End: ?

## 2024-08-31 MED ORDER — PROMETHAZINE HCL 12.5 MG PO TABS: 12.5000 mg | ORAL_TABLET | Freq: Four times a day (QID) | ORAL | 2 refills | Status: AC | PRN

## 2024-08-31 NOTE — Progress Notes (Unsigned)
 08/31/2024 2:44 PM   [name] 03/19/1966 [id number]  Referring provider: Tobie Suzzane POUR, MD [address] [place],  [place] [address]  Followup nephrolithiasis   HPI: Ms Bierly is a 58yo here for followup for nephrolithiasis and chronic flank pain. She was doing well from a pain standpoint but recently the left flank pain has been constant. She was using flexeril  but now the medication does not improve her pain. She has new intermittent right flank pain.    PMH: Past Medical History:  Diagnosis Date   Diabetes mellitus    insulin  pump 2013   Dysphagia 03/09/2020   Epidural hematoma (HCC) 12/14/2021   GERD (gastroesophageal reflux disease)    History of kidney stones    Hypercholesteremia    Hypertension    Incarcerated umbilical hernia 03/25/2014   Kidney stone    Kidney stones 08/31/2008   Qualifier: Diagnosis of  By: Karren MD, Cornelius     Neuropathy    Obesity    Palpitations    Shortness of breath    Vertigo     Surgical History: Past Surgical History:  Procedure Laterality Date   BIOPSY  06/23/2020   Procedure: BIOPSY;  Surgeon: Shaaron Lamar HERO, MD;  [facility];  Service: Endoscopy;;   CARDIAC CATHETERIZATION  12/11/2012   normal coronary arteries   CESAREAN SECTION  1994;2000   [facility]   CHOLECYSTECTOMY  1992   COLONOSCOPY WITH PROPOFOL  N/A 06/23/2020   large amount of stool in entire colon, prep inadequate.    COLONOSCOPY WITH PROPOFOL  N/A 01/03/2024   Procedure: COLONOSCOPY WITH PROPOFOL ;  Surgeon: Eartha Angelia Sieving, MD;  [facility];  Service: Gastroenterology;  Laterality: N/A;  2:00pm;asa 1   CYSTOSCOPY W/ URETERAL STENT PLACEMENT  05/08/2012   Procedure: CYSTOSCOPY WITH RETROGRADE PYELOGRAM/URETERAL STENT PLACEMENT;  Surgeon: Emery LILLETTE Blaze, MD;  Location: [facility];  Service: Urology;  Laterality: Right;   CYSTOSCOPY WITH RETROGRADE PYELOGRAM, URETEROSCOPY AND STENT PLACEMENT Right 05/04/2020   Procedure:  CYSTOSCOPY WITH RIGHT RETROGRADE PYELOGRAM, RIGHT URETEROSCOPY AND RIGHT URETERAL STENT EXCHANGE;  Surgeon: Sherrilee Belvie CROME, MD;  Location: [facility];  Service: Urology;  Laterality: Right;   CYSTOSCOPY WITH RETROGRADE PYELOGRAM, URETEROSCOPY AND STENT PLACEMENT Left 08/08/2020   Procedure: CYSTOSCOPY WITH RETROGRADE PYELOGRAM, URETEROSCOPY WITH LASER  AND STENT PLACEMENT;  Surgeon: Sherrilee Belvie CROME, MD;  Location: [facility];  Service: Urology;  Laterality: Left;   CYSTOSCOPY WITH RETROGRADE PYELOGRAM, URETEROSCOPY AND STENT PLACEMENT Right 11/08/2020   Procedure: CYSTOSCOPY WITH RIGHT RETROGRADE PYELOGRAM, RIGHT URETEROSCOPY AND STENT PLACEMENT;  Surgeon: Sherrilee Belvie CROME, MD;  Location: [facility];  Service: Urology;  Laterality: Right;   CYSTOSCOPY WITH RETROGRADE PYELOGRAM, URETEROSCOPY AND STENT PLACEMENT Left 12/27/2020   Procedure: CYSTOSCOPY WITH LEFT RETROGRADE PYELOGRAM, LEFT URETEROSCOPY AND LEFT URETERAL STENT PLACEMENT;  Surgeon: Sherrilee Belvie CROME, MD;  [facility];  Service: Urology;  Laterality: Left;   CYSTOSCOPY WITH RETROGRADE PYELOGRAM, URETEROSCOPY AND STENT PLACEMENT Bilateral 03/16/2021   Procedure: CYSTOSCOPY WITH BILATERAL  RETROGRADE PYELOGRAM, BILATERAL URETEROSCOPY AND STENT PLACEMENT ON THE LEFT;  Surgeon: Sherrilee Belvie CROME, MD;  [facility];  Service: Urology;  Laterality: Bilateral;   CYSTOSCOPY WITH RETROGRADE PYELOGRAM, URETEROSCOPY AND STENT PLACEMENT Left 06/28/2022   Procedure: CYSTOSCOPY WITH RETROGRADE PYELOGRAM, URETEROSCOPY AND STENT PLACEMENT;  Surgeon: Sherrilee Belvie CROME, MD;  [facility];  Service: Urology;  Laterality: Left;   CYSTOSCOPY WITH RETROGRADE PYELOGRAM, URETEROSCOPY AND STENT PLACEMENT Bilateral 09/27/2022   Procedure:  CYSTOSCOPY WITH BILATERAL RETROGRADE PYELOGRAM, URETEROSCOPY AND STENT PLACEMENT;  Surgeon: Sherrilee Belvie CROME, MD;  Location: [facility];  Service: Urology;  Laterality: Bilateral;  pt knows to arrive at 6:00   CYSTOSCOPY  WITH STENT PLACEMENT Right 02/25/2020   Procedure: CYSTOSCOPY WITH RIGHT RETROGRADE RIGHT STENT PLACEMENT;  Surgeon: Nieves Cough, MD;  Location: [facility];  Service: Urology;  Laterality: Right;   ESOPHAGOGASTRODUODENOSCOPY (EGD) WITH PROPOFOL  N/A 06/23/2020   Normal esophagus, multiple localized erosions were found in gastric antrum s/p biopsy, normal duodenum. Empiric dilation.Reactive gastropathy with erosions, negative H.pylori, no dysplasia.     EXTRACORPOREAL SHOCK WAVE LITHOTRIPSY Right 10/11/2020   Procedure: EXTRACORPOREAL SHOCK WAVE LITHOTRIPSY (ESWL);  Surgeon: Sherrilee Belvie CROME, MD;  Location: [facility];  Service: Urology;  Laterality: Right;   FOOT SURGERY Right March, 2013   [facility]-removal of bone spur   HOLMIUM LASER APPLICATION  05/04/2020   Procedure: HOLMIUM LASER LITHOTRIPSY RIGHT URETERAL CALCULUS;  Surgeon: Sherrilee Belvie CROME, MD;  Location: [facility];  Service: Urology;;   HYSTEROSCOPY WITH THERMACHOICE  04/25/2012   Procedure: HYSTEROSCOPY WITH THERMACHOICE;  Surgeon: Vonn VEAR Inch, MD;  Location: [facility];  Service: Gynecology;  Laterality: N/A;  total therapy time= 11 minutes 42 seconds; 34 ml D5w  in and 34 ml D5w out; temp =87 degrees F   LEFT HEART CATHETERIZATION WITH CORONARY ANGIOGRAM N/A 12/11/2012   Procedure: LEFT HEART CATHETERIZATION WITH CORONARY ANGIOGRAM;  Surgeon: Dorn JINNY Lesches, MD;  Location: [facility] CATH LAB;  Service: Cardiovascular;  Laterality: N/A;   LUMBAR WOUND DEBRIDEMENT N/A 12/14/2021   Procedure: LUMBAR HEMATOMA EVACUATION;  Surgeon: Cheryle Debby LABOR, MD;  Location: [facility];  Service: Neurosurgery;  Laterality: N/A;   MALONEY DILATION N/A 06/23/2020   Procedure: AGAPITO DILATION;  Surgeon: Shaaron Lamar HERO, MD;  [facility];  Service: Endoscopy;  Laterality: N/A;   Sinus sergery  1988   [facility]   STONE EXTRACTION WITH BASKET Right 11/08/2020   Procedure: STONE EXTRACTION WITH BASKET;  Surgeon: Sherrilee Belvie CROME, MD;  Location: [facility];  Service: Urology;  Laterality: Right;   TRANSFORAMINAL LUMBAR INTERBODY FUSION (TLIF) WITH PEDICLE SCREW FIXATION 1 LEVEL N/A 12/05/2021   Procedure: Open Lumbar five-Sacral one Laminectomy/Transforaminal Lumbar Interbody Fusion/Posterolateral instrumented fusion;  Surgeon: Cheryle Debby LABOR, MD;  Location: [facility];  Service: Neurosurgery;  Laterality: N/A;  3C   UMBILICAL HERNIA REPAIR N/A 03/25/2014   Procedure: UMBILICAL HERNIA REPAIR;  Surgeon: Elsie GORMAN Holland, MD;  Location: [facility];  Service: General;  Laterality: N/A;   uterine ablation      Home Medications:  Allergies as of 08/31/2024       Reactions   Ciprofloxacin  Hives, Swelling   Penicillins Anaphylaxis, Rash   Codeine Other (See Comments)   had hallicunations   Morphine  Nausea And Vomiting, Other (See Comments)   recieved in ED due to [facility] and had multple doses - gave visual hallucinations and vomitting.   Sulfonamide Derivatives Other (See Comments)   REACTION: Unsure - childhood allergy   Vancomycin  Itching        Medication List        Accurate as of August 31, 2024  2:44 PM. If you have any questions, ask your nurse or doctor.          albuterol  108 (90 Base) MCG/ACT inhaler Commonly known as: VENTOLIN  HFA Inhale 2 puffs into the lungs every 6 (six) hours as needed for wheezing or shortness of breath.   azithromycin   250 MG tablet Commonly known as: ZITHROMAX  Take 2 tablets on day 1, then 1 tablet daily on days 2 through 5   blood glucose meter kit and supplies Use up to four times daily as directed.   chlorhexidine  4 % external liquid Commonly known as: Hibiclens  Apply topically daily as needed.   citalopram  10 MG tablet Commonly known as: CeleXA  Take 1 tablet (10 mg total) by mouth daily.   clobetasol  ointment 0.05 % Commonly known as: TEMOVATE  Apply 1 Application topically 2 (two) times daily.   cyclobenzaprine  10 MG tablet Commonly known as: FLEXERIL  Take 1 tablet (10  mg total) by mouth 2 (two) times daily as needed for muscle spasms.   freestyle lancets Use to test 4 times daily   furosemide  20 MG tablet Commonly known as: LASIX  Take 20 mg by mouth 2 (two) times daily as needed for edema.   gabapentin  600 MG tablet Commonly known as: NEURONTIN  Take 1 tablet (600 mg total) by mouth 3 (three) times daily.   glucose blood test strip Test up to 4 times a day   hydrOXYzine  25 MG capsule Commonly known as: VISTARIL  Take 1 capsule (25 mg total) by mouth every 8 (eight) hours as needed.   ibuprofen  800 MG tablet Commonly known as: ADVIL  Take 1 tablet (800 mg total) by mouth every 8 (eight) hours as needed.   lisinopril -hydrochlorothiazide  20-12.5 MG tablet Commonly known as: ZESTORETIC  Take 1 tablet by mouth daily.   lubiprostone  24 MCG capsule Commonly known as: AMITIZA  Take 1 capsule (24 mcg total) by mouth 2 (two) times daily with a meal.   meclizine  25 MG tablet Commonly known as: ANTIVERT  Take 1 tablet (25 mg total) by mouth 2 (two) times daily as needed for dizziness.   meloxicam  7.5 MG tablet Commonly known as: Mobic  Take 1 tablet (7.5 mg total) by mouth daily.   Mounjaro  2.5 MG/0.5ML Pen Generic drug: tirzepatide  Inject 2.5 mg into the skin once a week.   mupirocin  ointment 2 % Commonly known as: BACTROBAN  Apply 1 Application topically 2 (two) times daily.   omeprazole  40 MG capsule Commonly known as: PRILOSEC Take 1 capsule (40 mg total) by mouth daily.   ondansetron  4 MG tablet Commonly known as: Zofran  Take 1 tablet (4 mg total) by mouth daily as needed for nausea or vomiting.   oxybutynin  5 MG 24 hr tablet Commonly known as: DITROPAN -XL Take 1 tablet (5 mg total) by mouth at bedtime.   Potassium Citrate  15 MEQ (1620 MG) Tbcr Commonly known as: Urocit-K  15 Take 1 tablet by mouth 2 (two) times daily.   promethazine  25 MG tablet Commonly known as: PHENERGAN  TAKE 1 TABLET(25 MG) BY MOUTH EVERY 8 HOURS AS NEEDED  FOR NAUSEA OR VOMITING   simvastatin  40 MG tablet Commonly known as: ZOCOR  Take 1 tablet (40 mg total) by mouth every evening for cholesterol   Synjardy  12.5-500 MG Tabs Generic drug: Empagliflozin -metFORMIN  HCl Take 1 tablet by mouth daily.   terbinafine  250 MG tablet Commonly known as: LAMISIL  Take 1 tablet (250 mg total) by mouth daily.   zolpidem  5 MG tablet Commonly known as: AMBIEN  Take 1 tablet (5 mg total) by mouth at bedtime as needed for sleep.        Allergies:  Allergies  Allergen Reactions   Ciprofloxacin  Hives and Swelling   Penicillins Anaphylaxis and Rash   Codeine Other (See Comments)    had hallicunations   Morphine  Nausea And Vomiting and Other (See Comments)  recieved in ED due to [facility] and had multple doses - gave visual hallucinations and vomitting.   Sulfonamide Derivatives Other (See Comments)    REACTION: Unsure - childhood allergy   Vancomycin  Itching    Family History: Family History  Problem Relation Age of Onset   Breast cancer Mother    Hypertension Mother    Cancer Mother    Diabetes Mother    Stroke Mother    Alzheimer's disease Mother    Diabetes Father    Breast cancer Sister    Cancer Paternal Uncle    Breast cancer Maternal Grandmother    Breast cancer Paternal Grandmother    Depression Paternal Grandmother    Depression Paternal Grandfather    Heart disease Other    Cancer Other    Diabetes Other    Achalasia Other    Anesthesia problems Neg Hx    Hypotension Neg Hx    Malignant hyperthermia Neg Hx    Pseudochol deficiency Neg Hx    Colon cancer Neg Hx    Colon polyps Neg Hx     Social History:  reports that she has never smoked. She has never used smokeless tobacco. She reports that she does not drink alcohol and does not use drugs.  ROS: All other review of systems were reviewed and are negative except what is noted above in HPI  Physical Exam: BP 110/60   Pulse 76   LMP 07/16/2012    Constitutional:  Alert and oriented, No acute distress. HEENT: [place] AT, moist mucus membranes.  Trachea midline, no masses. Cardiovascular: No clubbing, cyanosis, or edema. Respiratory: Normal respiratory effort, no increased work of breathing. GI: Abdomen is soft, nontender, nondistended, no abdominal masses GU: No CVA tenderness.  Lymph: No cervical or inguinal lymphadenopathy. Skin: No rashes, bruises or suspicious lesions. Neurologic: Grossly intact, no focal deficits, moving all 4 extremities. Psychiatric: Normal mood and affect.  Laboratory Data: Lab Results  Component Value Date   WBC 7.8 07/02/2022   HGB 14.2 07/02/2022   HCT 43.9 07/02/2022   MCV 85.4 07/02/2022   PLT 302 07/02/2022    Lab Results  Component Value Date   CREATININE 0.78 05/05/2024    No results found for: PSA  No results found for: TESTOSTERONE  Lab Results  Component Value Date   HGBA1C 5.7 05/06/2024    Urinalysis    Component Value Date/Time   COLORURINE STRAW (A) 07/02/2022 0717   APPEARANCEUR Clear 06/26/2024 0945   LABSPEC 1.003 (L) 07/02/2022 0717   PHURINE 6.0 07/02/2022 0717   GLUCOSEU 3+ (A) 06/26/2024 0945   HGBUR LARGE (A) 07/02/2022 0717   HGBUR moderate 08/31/2008 0908   BILIRUBINUR Negative 06/26/2024 0945   KETONESUR negative 04/15/2023 1427   KETONESUR NEGATIVE 07/02/2022 0717   PROTEINUR Negative 06/26/2024 0945   PROTEINUR NEGATIVE 07/02/2022 0717   UROBILINOGEN 0.2 04/15/2023 1427   UROBILINOGEN 0.2 09/21/2014 1055   NITRITE Negative 06/26/2024 0945   NITRITE NEGATIVE 07/02/2022 0717   LEUKOCYTESUR Negative 06/26/2024 0945   LEUKOCYTESUR NEGATIVE 07/02/2022 0717    Lab Results  Component Value Date   LABMICR 3.3 06/26/2024   LABMICR Comment 06/26/2024   WBCUA None seen 06/26/2024   LABEPIT 0-10 06/26/2024   BACTERIA None seen 06/26/2024    Pertinent Imaging: [facility]b 8/29 and Renal US  8/19: Images reviewed and discussed with the patient  Results for  orders placed during the hospital encounter of 08/07/24  DG Abd 1 View  Narrative CLINICAL DATA:  kidney  stone  EXAM: DG ABDOMEN 1V  COMPARISON:  December 12, 2023  FINDINGS: Air and stool filled nondilated loops of bowel. There is a 3 mm radiopaque density projecting over the inferior pole the RIGHT kidney. Pelvic phleboliths. Renal contours are relatively obscured by overlapping bowel contents. Degenerative changes of the lumbar spine.  IMPRESSION: There is a 3 mm nephrolithiasis in the inferior RIGHT kidney.   Electronically Signed By: Corean Salter M.D. On: 08/19/2024 07:52  No results found for this or any previous visit.  No results found for this or any previous visit.  No results found for this or any previous visit.  Results for orders placed during the hospital encounter of 08/03/24  Ultrasound renal complete  Narrative CLINICAL DATA:  Initial evaluation for nephrolithiasis.  EXAM: RENAL / URINARY TRACT ULTRASOUND COMPLETE  COMPARISON:  Prior ultrasound from 04/08/2024  FINDINGS: Right Kidney:  Renal measurements: 11.1 x 4.8 x 5.8 cm = volume: 161.3 mL. Renal echogenicity within normal limits. No nephrolithiasis or hydronephrosis. Previously seen pelvic fullness at the upper pole is no longer visualized. No focal renal mass.  Left Kidney:  Renal measurements: 10.7 x 5.1 x 5.3 cm = volume: 150.8 mL. Renal echogenicity within normal limits. No nephrolithiasis or hydronephrosis. No focal renal mass.  Bladder:  Appears normal for degree of bladder distention. Both jets are seen.  Other:  None.  IMPRESSION: Normal renal ultrasound. No sonographic evidence for nephrolithiasis or obstructive uropathy.   Electronically Signed By: Morene Hoard M.D. On: 08/08/2024 21:56  No results found for this or any previous visit.  No results found for this or any previous visit.  Results for orders placed during the hospital encounter of  08/10/22  CT RENAL STONE STUDY  Narrative CLINICAL DATA:  Left flank pain  EXAM: CT ABDOMEN AND PELVIS WITHOUT CONTRAST  TECHNIQUE: Multidetector CT imaging of the abdomen and pelvis was performed following the standard protocol without IV contrast.  RADIATION DOSE REDUCTION: This exam was performed according to the departmental dose-optimization program which includes automated exposure control, adjustment of the mA and/or kV according to patient size and/or use of iterative reconstruction technique.  COMPARISON:  None Available.  FINDINGS: Lower chest: No acute abnormality.  Hepatobiliary: No focal liver abnormality is seen. Status post cholecystectomy. No biliary dilatation.  Pancreas: Unremarkable  Spleen: Unremarkable  Adrenals/Urinary Tract: The adrenal glands are unremarkable. The kidneys are normal in size and position. Scattered nonobstructing renal calculi are seen within the kidneys bilaterally measuring up to 5 mm within the lower pole of the right kidney and 4 mm within the interpolar region of the left kidney. No hydronephrosis. No ureteral calculi. The bladder is unremarkable.  Stomach/Bowel: Stomach is within normal limits. Appendix appears normal. No evidence of bowel wall thickening, distention, or inflammatory changes.  Vascular/Lymphatic: Aortic atherosclerosis. No enlarged abdominal or pelvic lymph nodes.  Reproductive: Uterus and bilateral adnexa are unremarkable.  Other: No abdominal wall hernia.  No abdominopelvic ascites.  Musculoskeletal: Mild subcutaneous infiltration within the right lower quadrant abdominal wall is nonspecific, possibly related to local trauma or inflammation. L5-S1 not lumbar fusion with instrumentation has been performed. No acute bone abnormality. No lytic or blastic bone lesion.  IMPRESSION: 1. No acute intra-abdominal pathology identified. No definite radiographic explanation for the patient's reported  symptoms. 2. Mild bilateral nonobstructing nephrolithiasis. No urolithiasis. No hydronephrosis.  Aortic Atherosclerosis (ICD10-I70.0).   Electronically Signed By: Dorethia Molt M.D. On: 08/12/2022 22:01   Assessment & Plan:  1. Nephrolithiasis (Primary) Followup 6 months with a KUB - Urinalysis, Routine w reflex microscopic  2. Flank pain -cyclobenzapine 10mg  TID PRN   No follow-ups on file.  Belvie Clara, MD  [facility] [place]

## 2024-09-01 ENCOUNTER — Encounter: Payer: Self-pay | Admitting: Urology

## 2024-09-01 ENCOUNTER — Other Ambulatory Visit (HOSPITAL_COMMUNITY): Payer: Self-pay

## 2024-09-01 LAB — URINALYSIS, ROUTINE W REFLEX MICROSCOPIC
Bilirubin, UA: NEGATIVE
Ketones, UA: NEGATIVE
Nitrite, UA: NEGATIVE
Protein,UA: NEGATIVE
RBC, UA: NEGATIVE
Specific Gravity, UA: <1.005 — ABNORMAL LOW (ref 1.005–1.030)
Urobilinogen, Ur: 0.2 mg/dL (ref 0.2–1.0)
pH, UA: 6 (ref 5.0–7.5)

## 2024-09-01 LAB — MICROSCOPIC EXAMINATION
Bacteria, UA: NONE SEEN
RBC, Urine: NONE SEEN /HPF (ref 0–2)

## 2024-09-01 NOTE — Patient Instructions (Signed)

## 2024-09-08 ENCOUNTER — Encounter (INDEPENDENT_AMBULATORY_CARE_PROVIDER_SITE_OTHER): Admitting: Gastroenterology

## 2024-09-14 ENCOUNTER — Telehealth: Payer: Self-pay | Admitting: Urology

## 2024-09-14 NOTE — Telephone Encounter (Signed)
 Patient returned call requesting XRAY results.   Test was completed on 09/05/2024  Best number to contact patient [phone] Call transferred to clinical basket.

## 2024-09-14 NOTE — Telephone Encounter (Signed)
 Tried calling pt with no answer. Left vm for return call to office

## 2024-09-14 NOTE — Telephone Encounter (Signed)
 Patient return call and was made aware X-ray was routed to MD for review.

## 2024-09-15 ENCOUNTER — Ambulatory Visit (INDEPENDENT_AMBULATORY_CARE_PROVIDER_SITE_OTHER): Admitting: Gastroenterology

## 2024-09-16 ENCOUNTER — Telehealth: Payer: Self-pay

## 2024-09-16 NOTE — Telephone Encounter (Signed)
-----   Message from Belvie Clara sent at 09/15/2024  1:31 PM EDT ----- negative ----- Message ----- From: Gretta Carlos SAUNDERS, CMA Sent: 09/14/2024  11:14 AM EDT To: Belvie LITTIE Clara, MD  Please review. Pt requesting X-ray results

## 2024-09-16 NOTE — Telephone Encounter (Signed)
 Per Dr. Sherrilee negative   [name] want's to know what could be causing her pain. [name] is aware a message will be sent to Dr. Sherrilee. Voiced understanding

## 2024-09-18 ENCOUNTER — Other Ambulatory Visit (HOSPITAL_BASED_OUTPATIENT_CLINIC_OR_DEPARTMENT_OTHER): Payer: Self-pay

## 2024-09-23 ENCOUNTER — Encounter (INDEPENDENT_AMBULATORY_CARE_PROVIDER_SITE_OTHER): Payer: Self-pay | Admitting: Gastroenterology

## 2024-09-24 ENCOUNTER — Telehealth

## 2024-09-24 DIAGNOSIS — B353 Tinea pedis: Secondary | ICD-10-CM

## 2024-09-24 MED ORDER — FLUCONAZOLE 150 MG PO TABS: 150.0000 mg | ORAL_TABLET | ORAL | 0 refills | Status: AC

## 2024-09-24 NOTE — Patient Instructions (Signed)
 [name], thank you for joining [name] CHRISTELLA Dickinson, PA-C for today's virtual visit.  While this provider is not your primary care provider (PCP), if your PCP is located in our provider database this encounter information will be shared with them immediately following your visit.   A [facility] MyChart account gives you access to today's visit and all your visits, tests, and labs performed at [facility]  click here if you don't have a [facility] MyChart account or go to [email or web address]  Consent: (Patient) [name] provided verbal consent for this virtual visit at the beginning of the encounter.  Current Medications:  Current Outpatient Medications:    fluconazole  (DIFLUCAN ) 150 MG tablet, Take 1 tablet (150 mg total) by mouth once a week., Disp: 4 tablet, Rfl: 0   albuterol  (VENTOLIN  HFA) 108 (90 Base) MCG/ACT inhaler, Inhale 2 puffs into the lungs every 6 (six) hours as needed for wheezing or shortness of breath., Disp: 6.7 g, Rfl: 2   blood glucose meter kit and supplies, Use up to four times daily as directed., Disp: 1 each, Rfl: 0   chlorhexidine  (HIBICLENS ) 4 % external liquid, Apply topically daily as needed., Disp: 236 mL, Rfl: 0   citalopram  (CELEXA ) 10 MG tablet, Take 1 tablet (10 mg total) by mouth daily., Disp: 30 tablet, Rfl: 5   clobetasol  ointment (TEMOVATE ) 0.05 %, Apply 1 Application topically 2 (two) times daily., Disp: 60 g, Rfl: 0   cyclobenzaprine  (FLEXERIL ) 10 MG tablet, Take 1 tablet (10 mg total) by mouth 3 (three) times daily as needed for muscle spasms., Disp: 30 tablet, Rfl: 3   Empagliflozin -metFORMIN  HCl (SYNJARDY ) 12.5-500 MG TABS, Take 1 tablet by mouth daily., Disp: 60 tablet, Rfl: 0   furosemide  (LASIX ) 20 MG tablet, Take 20 mg by mouth 2 (two) times daily as needed for edema., Disp: , Rfl:    gabapentin  (NEURONTIN ) 600 MG tablet, Take 1 tablet (600 mg total) by mouth 3 (three) times daily., Disp: 90 tablet, Rfl: 2   glucose  blood test strip, Test up to 4 times a day, Disp: 100 each, Rfl: 0   hydrOXYzine  (VISTARIL ) 25 MG capsule, Take 1 capsule (25 mg total) by mouth every 8 (eight) hours as needed., Disp: 30 capsule, Rfl: 0   ibuprofen  (ADVIL ) 800 MG tablet, Take 1 tablet (800 mg total) by mouth every 8 (eight) hours as needed., Disp: 30 tablet, Rfl: 0   Lancets (FREESTYLE) lancets, Use to test 4 times daily, Disp: 100 each, Rfl: 0   lisinopril -hydrochlorothiazide  (ZESTORETIC ) 20-12.5 MG tablet, Take 1 tablet by mouth daily., Disp: 90 tablet, Rfl: 3   lubiprostone  (AMITIZA ) 24 MCG capsule, Take 1 capsule (24 mcg total) by mouth 2 (two) times daily with a meal., Disp: 60 capsule, Rfl: 3   meclizine  (ANTIVERT ) 25 MG tablet, Take 1 tablet (25 mg total) by mouth 2 (two) times daily as needed for dizziness., Disp: 30 tablet, Rfl: 0   meloxicam  (MOBIC ) 7.5 MG tablet, Take 1 tablet (7.5 mg total) by mouth daily., Disp: 30 tablet, Rfl: 5   mupirocin  ointment (BACTROBAN ) 2 %, Apply 1 Application topically 2 (two) times daily., Disp: 22 g, Rfl: 0   omeprazole  (PRILOSEC) 40 MG capsule, Take 1 capsule (40 mg total) by mouth daily., Disp: 90 capsule, Rfl: 1   ondansetron  (ZOFRAN ) 4 MG tablet, Take 1 tablet (4 mg total) by mouth daily as needed for nausea or vomiting., Disp: 20 tablet, Rfl: 1   oxybutynin  (DITROPAN -XL) 5  MG 24 hr tablet, Take 1 tablet (5 mg total) by mouth at bedtime., Disp: 30 tablet, Rfl: 5   oxyCODONE -acetaminophen  (PERCOCET) 5-325 MG tablet, Take 1 tablet by mouth every 4 (four) hours as needed., Disp: 30 tablet, Rfl: 0   Potassium Citrate  (UROCIT-K  15) 15 MEQ (1620 MG) TBCR, Take 1 tablet by mouth 2 (two) times daily., Disp: 60 tablet, Rfl: 11   promethazine  (PHENERGAN ) 12.5 MG tablet, Take 1 tablet (12.5 mg total) by mouth every 6 (six) hours as needed for nausea or vomiting., Disp: 30 tablet, Rfl: 2   promethazine  (PHENERGAN ) 25 MG tablet, TAKE 1 TABLET(25 MG) BY MOUTH EVERY 8 HOURS AS NEEDED FOR NAUSEA OR  VOMITING, Disp: 20 tablet, Rfl: 0   simvastatin  (ZOCOR ) 40 MG tablet, Take 1 tablet (40 mg total) by mouth every evening for cholesterol, Disp: 90 tablet, Rfl: 3   tirzepatide  (MOUNJARO ) 2.5 MG/0.5ML Pen, Inject 2.5 mg into the skin once a week., Disp: 6 mL, Rfl: 1   zolpidem  (AMBIEN ) 5 MG tablet, Take 1 tablet (5 mg total) by mouth at bedtime as needed for sleep., Disp: 30 tablet, Rfl: 3   Medications ordered in this encounter:  Meds ordered this encounter  Medications   fluconazole  (DIFLUCAN ) 150 MG tablet    Sig: Take 1 tablet (150 mg total) by mouth once a week.    Dispense:  4 tablet    Refill:  0    Supervising Provider:   LAMPTEY, PHILIP O [[id number]]     *If you need refills on other medications prior to your next appointment, please contact your pharmacy*  Follow-Up: Call back or seek an in-person evaluation if the symptoms worsen or if the condition fails to improve as anticipated.  [facility] Care [phone]  Other Instructions [facility] 2.850 Google reviews Dermatologist in [place], [place]  Address: [address] [place], [place] [address] Phone: [phone] Hours: 5?PM ? Opens 8?AM Mon    If you have been instructed to have an in-person evaluation today at a local Urgent Care facility, please use the link below. It will take you to a list of all of our available [facility], including address, phone number and hours of operation. Please do not delay care.  [facility]  If you or a family member do not have a primary care provider, use the link below to schedule a visit and establish care. When you choose a [facility] primary care physician or advanced practice provider, you gain a long-term partner in health. Find a Primary Care Provider  Learn more about [facility]'s in-office and virtual care options:  - Get Care Now

## 2024-09-24 NOTE — Progress Notes (Signed)
 Virtual Visit Consent   [name], you are scheduled for a virtual visit with a [facility] provider today. Just as with appointments in the office, your consent must be obtained to participate. Your consent will be active for this visit and any virtual visit you may have with one of our providers in the next 365 days. If you have a MyChart account, a copy of this consent can be sent to you electronically.  As this is a virtual visit, video technology does not allow for your provider to perform a traditional examination. This may limit your provider's ability to fully assess your condition. If your provider identifies any concerns that need to be evaluated in person or the need to arrange testing (such as labs, EKG, etc.), we will make arrangements to do so. Although advances in technology are sophisticated, we cannot ensure that it will always work on either your end or our end. If the connection with a video visit is poor, the visit may have to be switched to a telephone visit. With either a video or telephone visit, we are not always able to ensure that we have a secure connection.  By engaging in this virtual visit, you consent to the provision of healthcare and authorize for your insurance to be billed (if applicable) for the services provided during this visit. Depending on your insurance coverage, you may receive a charge related to this service.  I need to obtain your verbal consent now. Are you willing to proceed with your visit today? [name] has provided verbal consent on 09/24/2024 for a virtual visit (video or telephone). [name] CHRISTELLA Dickinson, PA-C  Date: 09/24/2024 4:37 PM   Virtual Visit via Video Note   I, [name], connected with  [name]  ([id number], 03/19/1966) on 09/24/24 at  4:00 PM EDT by a video-enabled telemedicine application and verified that I am speaking with the correct person using two identifiers.  Location: Patient: Virtual Visit Location  Patient: Home Provider: Virtual Visit Location Provider: Home Office   I discussed the limitations of evaluation and management by telemedicine and the availability of in person appointments. The patient expressed understanding and agreed to proceed.    History of Present Illness: [name] is a 58 y.o. who identifies as a female who was assigned female at birth, and is being seen today for foot pain.  HPI: HPI  Problems:  Patient Active Problem List   Diagnosis Date Noted   Acute recurrent frontal sinusitis 08/26/2024   Urinary urgency 06/26/2024   Tinea pedis of both feet 06/26/2024   Allergic dermatitis 03/02/2024   Right-sided chest wall pain 02/14/2024   Mass of upper outer quadrant of right breast 02/05/2024   Encounter for well woman exam with routine gynecological exam 02/05/2024   Family history of breast cancer in first degree relative 02/05/2024   Rash and nonspecific skin eruption 01/13/2024   B12 deficiency 06/17/2023   Long-term (current) use of injectable non-insulin  antidiabetic drugs 05/30/2023   Epidermoid cyst of skin of scalp 04/15/2023   Cervical spinal stenosis 01/30/2023   Nodule of finger of right hand 01/22/2023   Acute idiopathic gout 01/01/2023   Fecal incontinence 01/01/2023   Vertigo 08/09/2022   Chronic mucoid otitis media of left ear 05/16/2022   Primary insomnia 05/09/2022   ASCUS of cervix with negative high risk HPV 01/16/2022   Spondylolisthesis of lumbar region 12/05/2021   Spinal stenosis of lumbar region with neurogenic claudication 10/25/2021  Chronic bilateral low back pain with left-sided sciatica 10/18/2021   Mid back pain on right side 10/31/2020   GERD (gastroesophageal reflux disease) 03/09/2020   Other intervertebral disc degeneration, lumbar region 05/24/2019   OSA (obstructive sleep apnea) 12/08/2014   Depression 07/15/2013   Dyspnea 11/12/2012   Headache 10/14/2012   Neuropathy 10/14/2012   Essential hypertension, benign  05/10/2012   Diabetes mellitus (HCC) 09/29/2008   Mixed hyperlipidemia 09/29/2008   Class 2 severe obesity due to excess calories with serious comorbidity and body mass index (BMI) of 38.0 to 38.9 in adult 08/31/2008   Recurrent nephrolithiasis 08/31/2008    Allergies:  Allergies  Allergen Reactions   Ciprofloxacin  Hives and Swelling   Penicillins Anaphylaxis and Rash   Codeine Other (See Comments)    had hallicunations   Morphine  Nausea And Vomiting and Other (See Comments)    recieved in ED due to [facility] and had multple doses - gave visual hallucinations and vomitting.   Sulfonamide Derivatives Other (See Comments)    REACTION: Unsure - childhood allergy   Vancomycin  Itching   Medications:  Current Outpatient Medications:    fluconazole  (DIFLUCAN ) 150 MG tablet, Take 1 tablet (150 mg total) by mouth once a week., Disp: 4 tablet, Rfl: 0   albuterol  (VENTOLIN  HFA) 108 (90 Base) MCG/ACT inhaler, Inhale 2 puffs into the lungs every 6 (six) hours as needed for wheezing or shortness of breath., Disp: 6.7 g, Rfl: 2   blood glucose meter kit and supplies, Use up to four times daily as directed., Disp: 1 each, Rfl: 0   chlorhexidine  (HIBICLENS ) 4 % external liquid, Apply topically daily as needed., Disp: 236 mL, Rfl: 0   citalopram  (CELEXA ) 10 MG tablet, Take 1 tablet (10 mg total) by mouth daily., Disp: 30 tablet, Rfl: 5   clobetasol  ointment (TEMOVATE ) 0.05 %, Apply 1 Application topically 2 (two) times daily., Disp: 60 g, Rfl: 0   cyclobenzaprine  (FLEXERIL ) 10 MG tablet, Take 1 tablet (10 mg total) by mouth 3 (three) times daily as needed for muscle spasms., Disp: 30 tablet, Rfl: 3   Empagliflozin -metFORMIN  HCl (SYNJARDY ) 12.5-500 MG TABS, Take 1 tablet by mouth daily., Disp: 60 tablet, Rfl: 0   furosemide  (LASIX ) 20 MG tablet, Take 20 mg by mouth 2 (two) times daily as needed for edema., Disp: , Rfl:    gabapentin  (NEURONTIN ) 600 MG tablet, Take 1 tablet (600 mg total) by mouth 3  (three) times daily., Disp: 90 tablet, Rfl: 2   glucose blood test strip, Test up to 4 times a day, Disp: 100 each, Rfl: 0   hydrOXYzine  (VISTARIL ) 25 MG capsule, Take 1 capsule (25 mg total) by mouth every 8 (eight) hours as needed., Disp: 30 capsule, Rfl: 0   ibuprofen  (ADVIL ) 800 MG tablet, Take 1 tablet (800 mg total) by mouth every 8 (eight) hours as needed., Disp: 30 tablet, Rfl: 0   Lancets (FREESTYLE) lancets, Use to test 4 times daily, Disp: 100 each, Rfl: 0   lisinopril -hydrochlorothiazide  (ZESTORETIC ) 20-12.5 MG tablet, Take 1 tablet by mouth daily., Disp: 90 tablet, Rfl: 3   lubiprostone  (AMITIZA ) 24 MCG capsule, Take 1 capsule (24 mcg total) by mouth 2 (two) times daily with a meal., Disp: 60 capsule, Rfl: 3   meclizine  (ANTIVERT ) 25 MG tablet, Take 1 tablet (25 mg total) by mouth 2 (two) times daily as needed for dizziness., Disp: 30 tablet, Rfl: 0   meloxicam  (MOBIC ) 7.5 MG tablet, Take 1 tablet (7.5 mg total) by mouth  daily., Disp: 30 tablet, Rfl: 5   mupirocin  ointment (BACTROBAN ) 2 %, Apply 1 Application topically 2 (two) times daily., Disp: 22 g, Rfl: 0   omeprazole  (PRILOSEC) 40 MG capsule, Take 1 capsule (40 mg total) by mouth daily., Disp: 90 capsule, Rfl: 1   ondansetron  (ZOFRAN ) 4 MG tablet, Take 1 tablet (4 mg total) by mouth daily as needed for nausea or vomiting., Disp: 20 tablet, Rfl: 1   oxybutynin  (DITROPAN -XL) 5 MG 24 hr tablet, Take 1 tablet (5 mg total) by mouth at bedtime., Disp: 30 tablet, Rfl: 5   oxyCODONE -acetaminophen  (PERCOCET) 5-325 MG tablet, Take 1 tablet by mouth every 4 (four) hours as needed., Disp: 30 tablet, Rfl: 0   Potassium Citrate  (UROCIT-K  15) 15 MEQ (1620 MG) TBCR, Take 1 tablet by mouth 2 (two) times daily., Disp: 60 tablet, Rfl: 11   promethazine  (PHENERGAN ) 12.5 MG tablet, Take 1 tablet (12.5 mg total) by mouth every 6 (six) hours as needed for nausea or vomiting., Disp: 30 tablet, Rfl: 2   promethazine  (PHENERGAN ) 25 MG tablet, TAKE 1  TABLET(25 MG) BY MOUTH EVERY 8 HOURS AS NEEDED FOR NAUSEA OR VOMITING, Disp: 20 tablet, Rfl: 0   simvastatin  (ZOCOR ) 40 MG tablet, Take 1 tablet (40 mg total) by mouth every evening for cholesterol, Disp: 90 tablet, Rfl: 3   tirzepatide  (MOUNJARO ) 2.5 MG/0.5ML Pen, Inject 2.5 mg into the skin once a week., Disp: 6 mL, Rfl: 1   zolpidem  (AMBIEN ) 5 MG tablet, Take 1 tablet (5 mg total) by mouth at bedtime as needed for sleep., Disp: 30 tablet, Rfl: 3  Observations/Objective: Patient is well-developed, well-nourished in no acute distress.  Resting comfortably at home.  Head is normocephalic, atraumatic.  No labored breathing.  Speech is clear and coherent with logical content.  Patient is alert and oriented at baseline.    Assessment and Plan: 1. Tinea pedis of right foot (Primary) - fluconazole  (DIFLUCAN ) 150 MG tablet; Take 1 tablet (150 mg total) by mouth once a week.  Dispense: 4 tablet; Refill: 0  - Ongoing issue - Failed Terbinafine  orally x 2, Lotrisone , Clobetasol , Chlorhexidine  wash, Clotrimazole , Doxycycline , and Azithromycin  - Has dermatology referral but no appt scheduled - Will give Fluconazole  once weekly as noted above - She is going to call Dermatology to get appt as she may require further evaluation since she has failed multiple treatments  Follow Up Instructions: I discussed the assessment and treatment plan with the patient. The patient was provided an opportunity to ask questions and all were answered. The patient agreed with the plan and demonstrated an understanding of the instructions.  A copy of instructions were sent to the patient via MyChart unless otherwise noted below.    The patient was advised to call back or seek an in-person evaluation if the symptoms worsen or if the condition fails to improve as anticipated.    [name] CHRISTELLA Dickinson, PA-C

## 2024-09-28 ENCOUNTER — Other Ambulatory Visit (HOSPITAL_BASED_OUTPATIENT_CLINIC_OR_DEPARTMENT_OTHER): Payer: Self-pay

## 2024-09-29 NOTE — Telephone Encounter (Signed)
 Pt is made aware and voiced understanding . Pt state's she went to her MD which operated on her back and was told her back was good. Its likely her back

## 2024-10-01 ENCOUNTER — Other Ambulatory Visit (HOSPITAL_BASED_OUTPATIENT_CLINIC_OR_DEPARTMENT_OTHER): Payer: Self-pay

## 2024-10-08 ENCOUNTER — Other Ambulatory Visit: Payer: Self-pay | Admitting: Medical Genetics

## 2024-10-08 ENCOUNTER — Ambulatory Visit (INDEPENDENT_AMBULATORY_CARE_PROVIDER_SITE_OTHER): Admitting: Gastroenterology

## 2024-10-08 ENCOUNTER — Encounter (INDEPENDENT_AMBULATORY_CARE_PROVIDER_SITE_OTHER): Payer: Self-pay | Admitting: Gastroenterology

## 2024-10-08 VITALS — BP 110/56 | HR 83 | Temp 97.5°F | Ht <= 58 in | Wt 170.6 lb

## 2024-10-08 DIAGNOSIS — Z006 Encounter for examination for normal comparison and control in clinical research program: Secondary | ICD-10-CM

## 2024-10-08 DIAGNOSIS — K581 Irritable bowel syndrome with constipation: Secondary | ICD-10-CM

## 2024-10-08 DIAGNOSIS — K5904 Chronic idiopathic constipation: Secondary | ICD-10-CM | POA: Insufficient documentation

## 2024-10-08 MED ORDER — DICYCLOMINE HCL 10 MG PO CAPS
10.0000 mg | ORAL_CAPSULE | Freq: Three times a day (TID) | ORAL | 1 refills | Status: AC | PRN
Start: 2024-10-08 — End: ?

## 2024-10-08 MED ORDER — TRULANCE 3 MG PO TABS: 3.0000 mg | ORAL_TABLET | Freq: Every day | ORAL | 1 refills | Status: AC

## 2024-10-08 NOTE — Progress Notes (Addendum)
 Referring Provider: Tobie Suzzane POUR, MD Primary Care Physician:  Tobie Suzzane POUR, MD Primary GI Physician: Dr. Eartha   Chief Complaint  Patient presents with   Follow-up    Patient here today for a follow up on Constipation. Patient says this is better, it just depends on the day. She has some fecal incontinence at times.   HPI:   [name] is a 58 y.o. female with past medical history of DM, HTN, HLD   Patient presenting today for:  Follow up of IBS with constipation   Last seen in April, at that time taking amitiza  8mcg BID, still having constipation, using miralax  and metamucil PRN. Last TSH WNL in June 2024  Recommended increase amitiza  to 24mcg BID, 2 capfuls miralax  every 4 hours til bowels move, increase water  intake, fiber in diet  Labs in may with CMP WNL, TSH 0.909 T4 0.85  Present:  Doing amitiza  24mcg BID, still can go 2-3 days  without a BM, she notes that she has looser to watery stools at times and other times has solid stools. Sometimes has fecal urgency, stomach cramping prior to having to go. She can have upwards of 4 stools per day on some days. She endorses some abdominal cramping, pain at times, usually improves with having a BM. She is on mounjaro , she has been on this since may. No red flag symptoms. Patient denies melena, hematochezia, nausea, vomiting, diarrhea, constipation, dysphagia, odyonophagia, early satiety or weight loss.    Last Colonoscopy:12/2023- The entire examined colon is normal.                           - Non-bleeding internal hemorrhoids.                           - No specimens collected.   Last EGD:06/23/2020, performed by Dr. Shaaron Multiple erosions in the gastric antrum, empiric dilation of the esophagus was performed.  Biopsies of the stomach were negative for H. pylori.   Repeat colonoscopy 10 year Filed Weights   10/08/24 1522  Weight: 170 lb 9.6 oz (77.4 kg)     Past Medical History:  Diagnosis Date   Diabetes  mellitus    insulin  pump 2013   Dysphagia 03/09/2020   Epidural hematoma (HCC) 12/14/2021   GERD (gastroesophageal reflux disease)    History of kidney stones    Hypercholesteremia    Hypertension    Incarcerated umbilical hernia 03/25/2014   Kidney stone    Kidney stones 08/31/2008   Qualifier: Diagnosis of  By: Karren MD, Cornelius     Neuropathy    Obesity    Palpitations    Shortness of breath    Vertigo     Past Surgical History:  Procedure Laterality Date   BIOPSY  06/23/2020   Procedure: BIOPSY;  Surgeon: Shaaron Lamar HERO, MD;  [facility];  Service: Endoscopy;;   CARDIAC CATHETERIZATION  12/11/2012   normal coronary arteries   CESAREAN SECTION  1994;2000   [facility]   CHOLECYSTECTOMY  1992   COLONOSCOPY WITH PROPOFOL  N/A 06/23/2020   large amount of stool in entire colon, prep inadequate.    COLONOSCOPY WITH PROPOFOL  N/A 01/03/2024   Procedure: COLONOSCOPY WITH PROPOFOL ;  Surgeon: Eartha Angelia Sieving, MD;  [facility];  Service: Gastroenterology;  Laterality: N/A;  2:00pm;asa 1   CYSTOSCOPY W/ URETERAL STENT PLACEMENT  05/08/2012  Procedure: CYSTOSCOPY WITH RETROGRADE PYELOGRAM/URETERAL STENT PLACEMENT;  Surgeon: Mohammad I Javaid, MD;  Location: [facility];  Service: Urology;  Laterality: Right;   CYSTOSCOPY WITH RETROGRADE PYELOGRAM, URETEROSCOPY AND STENT PLACEMENT Right 05/04/2020   Procedure: CYSTOSCOPY WITH RIGHT RETROGRADE PYELOGRAM, RIGHT URETEROSCOPY AND RIGHT URETERAL STENT EXCHANGE;  Surgeon: Sherrilee Belvie CROME, MD;  Location: [facility];  Service: Urology;  Laterality: Right;   CYSTOSCOPY WITH RETROGRADE PYELOGRAM, URETEROSCOPY AND STENT PLACEMENT Left 08/08/2020   Procedure: CYSTOSCOPY WITH RETROGRADE PYELOGRAM, URETEROSCOPY WITH LASER  AND STENT PLACEMENT;  Surgeon: Sherrilee Belvie CROME, MD;  Location: [facility];  Service: Urology;  Laterality: Left;   CYSTOSCOPY WITH RETROGRADE PYELOGRAM, URETEROSCOPY AND STENT PLACEMENT Right 11/08/2020    Procedure: CYSTOSCOPY WITH RIGHT RETROGRADE PYELOGRAM, RIGHT URETEROSCOPY AND STENT PLACEMENT;  Surgeon: Sherrilee Belvie CROME, MD;  Location: [facility];  Service: Urology;  Laterality: Right;   CYSTOSCOPY WITH RETROGRADE PYELOGRAM, URETEROSCOPY AND STENT PLACEMENT Left 12/27/2020   Procedure: CYSTOSCOPY WITH LEFT RETROGRADE PYELOGRAM, LEFT URETEROSCOPY AND LEFT URETERAL STENT PLACEMENT;  Surgeon: Sherrilee Belvie CROME, MD;  [facility];  Service: Urology;  Laterality: Left;   CYSTOSCOPY WITH RETROGRADE PYELOGRAM, URETEROSCOPY AND STENT PLACEMENT Bilateral 03/16/2021   Procedure: CYSTOSCOPY WITH BILATERAL  RETROGRADE PYELOGRAM, BILATERAL URETEROSCOPY AND STENT PLACEMENT ON THE LEFT;  Surgeon: Sherrilee Belvie CROME, MD;  [facility]: [facility];  Service: Urology;  Laterality: Bilateral;   CYSTOSCOPY WITH RETROGRADE PYELOGRAM, URETEROSCOPY AND STENT PLACEMENT Left 06/28/2022   Procedure: CYSTOSCOPY WITH RETROGRADE PYELOGRAM, URETEROSCOPY AND STENT PLACEMENT;  Surgeon: Sherrilee Belvie CROME, MD;  [facility]: [facility];  Service: Urology;  Laterality: Left;   CYSTOSCOPY WITH RETROGRADE PYELOGRAM, URETEROSCOPY AND STENT PLACEMENT Bilateral 09/27/2022   Procedure: CYSTOSCOPY WITH BILATERAL RETROGRADE PYELOGRAM, URETEROSCOPY AND STENT PLACEMENT;  Surgeon: Sherrilee Belvie CROME, MD;  Location: [facility];  Service: Urology;  Laterality: Bilateral;  pt knows to arrive at 6:00   CYSTOSCOPY WITH STENT PLACEMENT Right 02/25/2020   Procedure: CYSTOSCOPY WITH RIGHT RETROGRADE RIGHT STENT PLACEMENT;  Surgeon: Nieves Cough, MD;  Location: [facility];  Service: Urology;  Laterality: Right;   ESOPHAGOGASTRODUODENOSCOPY (EGD) WITH PROPOFOL  N/A 06/23/2020   Normal esophagus, multiple localized erosions were found in gastric antrum s/p biopsy, normal duodenum. Empiric dilation.Reactive gastropathy with erosions, negative H.pylori, no dysplasia.     EXTRACORPOREAL SHOCK WAVE LITHOTRIPSY Right 10/11/2020   Procedure: EXTRACORPOREAL SHOCK WAVE  LITHOTRIPSY (ESWL);  Surgeon: Sherrilee Belvie CROME, MD;  Location: [facility];  Service: Urology;  Laterality: Right;   FOOT SURGERY Right March, 2013   [facility]-removal of bone spur   HOLMIUM LASER APPLICATION  05/04/2020   Procedure: HOLMIUM LASER LITHOTRIPSY RIGHT URETERAL CALCULUS;  Surgeon: Sherrilee Belvie CROME, MD;  Location: [facility];  Service: Urology;;   HYSTEROSCOPY WITH THERMACHOICE  04/25/2012   Procedure: HYSTEROSCOPY WITH THERMACHOICE;  Surgeon: Vonn VEAR Inch, MD;  Location: [facility];  Service: Gynecology;  Laterality: N/A;  total therapy time= 11 minutes 42 seconds; 34 ml D5w  in and 34 ml D5w out; temp =87 degrees F   LEFT HEART CATHETERIZATION WITH CORONARY ANGIOGRAM N/A 12/11/2012   Procedure: LEFT HEART CATHETERIZATION WITH CORONARY ANGIOGRAM;  Surgeon: Dorn JINNY Lesches, MD;  Location: [facility] C[facility];  Service: Cardiovascular;  Laterality: N/A;   LUMBAR WOUND DEBRIDEMENT N/A 12/14/2021   Procedure: LUMBAR HEMATOMA EVACUATION;  Surgeon: Cheryle Debby LABOR, MD;  Location: [facility];  Service: Neurosurgery;  Laterality: N/A;   MALONEY DILATION N/A 06/23/2020   Procedure: AGAPITO DILATION;  Surgeon: Shaaron Lamar HERO,  MD;  [facility];  Service: Endoscopy;  Laterality: N/A;   Sinus sergery  1988   [place]   STONE EXTRACTION WITH BASKET Right 11/08/2020   Procedure: STONE EXTRACTION WITH BASKET;  Surgeon: Sherrilee Belvie CROME, MD;  Location: [facility];  Service: Urology;  Laterality: Right;   TRANSFORAMINAL LUMBAR INTERBODY FUSION (TLIF) WITH PEDICLE SCREW FIXATION 1 LEVEL N/A 12/05/2021   Procedure: Open Lumbar five-Sacral one Laminectomy/Transforaminal Lumbar Interbody Fusion/Posterolateral instrumented fusion;  Surgeon: Cheryle Debby LABOR, MD;  Location: [facility];  Service: Neurosurgery;  Laterality: N/A;  3C   UMBILICAL HERNIA REPAIR N/A 03/25/2014   Procedure: UMBILICAL HERNIA REPAIR;  Surgeon: Elsie GORMAN Holland, MD;  Location: [facility];  Service: General;  Laterality: N/A;   uterine  ablation      Current Outpatient Medications  Medication Sig Dispense Refill   albuterol  (VENTOLIN  HFA) 108 (90 Base) MCG/ACT inhaler Inhale 2 puffs into the lungs every 6 (six) hours as needed for wheezing or shortness of breath. 6.7 g 2   blood glucose meter kit and supplies Use up to four times daily as directed. 1 each 0   chlorhexidine  (HIBICLENS ) 4 % external liquid Apply topically daily as needed. 236 mL 0   citalopram  (CELEXA ) 10 MG tablet Take 1 tablet (10 mg total) by mouth daily. 30 tablet 5   clobetasol  ointment (TEMOVATE ) 0.05 % Apply 1 Application topically 2 (two) times daily. 60 g 0   cyclobenzaprine  (FLEXERIL ) 10 MG tablet Take 1 tablet (10 mg total) by mouth 3 (three) times daily as needed for muscle spasms. 30 tablet 3   Empagliflozin -metFORMIN  HCl (SYNJARDY ) 12.5-500 MG TABS Take 1 tablet by mouth daily. 60 tablet 0   fluconazole  (DIFLUCAN ) 150 MG tablet Take 1 tablet (150 mg total) by mouth once a week. 4 tablet 0   furosemide  (LASIX ) 20 MG tablet Take 20 mg by mouth 2 (two) times daily as needed for edema.     gabapentin  (NEURONTIN ) 600 MG tablet Take 1 tablet (600 mg total) by mouth 3 (three) times daily. 90 tablet 2   glucose blood test strip Test up to 4 times a day 100 each 0   hydrOXYzine  (VISTARIL ) 25 MG capsule Take 1 capsule (25 mg total) by mouth every 8 (eight) hours as needed. 30 capsule 0   ibuprofen  (ADVIL ) 800 MG tablet Take 1 tablet (800 mg total) by mouth every 8 (eight) hours as needed. 30 tablet 0   Lancets (FREESTYLE) lancets Use to test 4 times daily 100 each 0   lisinopril -hydrochlorothiazide  (ZESTORETIC ) 20-12.5 MG tablet Take 1 tablet by mouth daily. 90 tablet 3   lubiprostone  (AMITIZA ) 24 MCG capsule Take 1 capsule (24 mcg total) by mouth 2 (two) times daily with a meal. 60 capsule 3   meclizine  (ANTIVERT ) 25 MG tablet Take 1 tablet (25 mg total) by mouth 2 (two) times daily as needed for dizziness. 30 tablet 0   meloxicam  (MOBIC ) 7.5 MG tablet Take  1 tablet (7.5 mg total) by mouth daily. 30 tablet 5   mupirocin  ointment (BACTROBAN ) 2 % Apply 1 Application topically 2 (two) times daily. 22 g 0   omeprazole  (PRILOSEC) 40 MG capsule Take 1 capsule (40 mg total) by mouth daily. 90 capsule 1   ondansetron  (ZOFRAN ) 4 MG tablet Take 1 tablet (4 mg total) by mouth daily as needed for nausea or vomiting. 20 tablet 1   oxybutynin  (DITROPAN -XL) 5 MG 24 hr tablet Take 1 tablet (5 mg total) by  mouth at bedtime. 30 tablet 5   oxyCODONE -acetaminophen  (PERCOCET) 5-325 MG tablet Take 1 tablet by mouth every 4 (four) hours as needed. 30 tablet 0   Potassium Citrate  (UROCIT-K  15) 15 MEQ (1620 MG) TBCR Take 1 tablet by mouth 2 (two) times daily. 60 tablet 11   promethazine  (PHENERGAN ) 12.5 MG tablet Take 1 tablet (12.5 mg total) by mouth every 6 (six) hours as needed for nausea or vomiting. 30 tablet 2   promethazine  (PHENERGAN ) 25 MG tablet TAKE 1 TABLET(25 MG) BY MOUTH EVERY 8 HOURS AS NEEDED FOR NAUSEA OR VOMITING 20 tablet 0   simvastatin  (ZOCOR ) 40 MG tablet Take 1 tablet (40 mg total) by mouth every evening for cholesterol 90 tablet 3   tirzepatide  (MOUNJARO ) 2.5 MG/0.5ML Pen Inject 2.5 mg into the skin once a week. 6 mL 1   zolpidem  (AMBIEN ) 5 MG tablet Take 1 tablet (5 mg total) by mouth at bedtime as needed for sleep. 30 tablet 3   No current facility-administered medications for this visit.    Allergies as of 10/08/2024 - Review Complete 10/08/2024  Allergen Reaction Noted   Ciprofloxacin  Hives and Swelling 04/25/2012   Penicillins Anaphylaxis and Rash 08/31/2008   Codeine Other (See Comments) 08/31/2008   Morphine  Nausea And Vomiting and Other (See Comments) 08/31/2008   Sulfonamide derivatives Other (See Comments) 08/31/2008   Vancomycin  Itching 02/19/2014    Social History   Socioeconomic History   Marital status: Married    Spouse name: Not on file   Number of children: 2   Years of education: college   Highest education level: 12th  grade  Occupational History   Occupation: Scientist, Research (medical): Audiological Scientist: [employer]   Occupation: LAWYER- at [employer]  Tobacco Use   Smoking status: Never   Smokeless tobacco: Never  Vaping Use   Vaping status: Never Used  Substance and Sexual Activity   Alcohol use: No   Drug use: No   Sexual activity: Yes    Partners: Male    Birth control/protection: Post-menopausal    Comment: ablation  Other Topics Concern   Not on file  Social History Narrative   Regular exercise: walks Caffeine use:    Social Drivers of Corporate Investment Banker Strain: Low Risk  (02/05/2024)   Overall Financial Resource Strain (CARDIA)    Difficulty of Paying Living Expenses: Not hard at all  Food Insecurity: No Food Insecurity (02/05/2024)   Hunger Vital Sign    Worried About Running Out of Food in the Last Year: Never true    Ran Out of Food in the Last Year: Never true  Transportation Needs: No Transportation Needs (02/05/2024)   PRAPARE - Administrator, Civil Service (Medical): No    Lack of Transportation (Non-Medical): No  Physical Activity: Insufficiently Active (02/05/2024)   Exercise Vital Sign    Days of Exercise per Week: 3 days    Minutes of Exercise per Session: 30 min  Stress: No Stress Concern Present (02/05/2024)   [employer] of Occupational Health - Occupational Stress Questionnaire    Feeling of Stress : Only a little  Recent Concern: Stress - Stress Concern Present (12/27/2023)   [employer] of Occupational Health - Occupational Stress Questionnaire    Feeling of Stress : To some extent  Social Connections: Moderately Integrated (02/05/2024)   Social Connection and Isolation Panel    Frequency of Communication with Friends and Family: More  than three times a week    Frequency of Social Gatherings with Friends and Family: Three times a week    Attends Religious Services: More than 4 times per year    Active Member of Clubs or  Organizations: No    Attends Banker Meetings: Never    Marital Status: Married    Review of systems General: negative for malaise, night sweats, fever, chills, weight loss Neck: Negative for lumps, goiter, pain and significant neck swelling Resp: Negative for cough, wheezing, dyspnea at rest CV: Negative for chest pain, leg swelling, palpitations, orthopnea GI: denies melena, hematochezia, nausea, vomiting, diarrhea, dysphagia, odyonophagia, early satiety or unintentional weight loss. +constipation/looser stools +fecal urgency +abdominal cramping  MSK: Negative for joint pain or swelling, back pain, and muscle pain. Derm: Negative for itching or rash Psych: Denies depression, anxiety, memory loss, confusion. No homicidal or suicidal ideation.  Heme: Negative for prolonged bleeding, bruising easily, and swollen nodes. Endocrine: Negative for cold or heat intolerance, polyuria, polydipsia and goiter. Neuro: negative for tremor, gait imbalance, syncope and seizures. The remainder of the review of systems is noncontributory.  Physical Exam: BP (!) 110/56 (BP Location: Left Arm, Patient Position: Sitting, Cuff Size: Normal)   Pulse 83   Temp (!) 97.5 F (36.4 C) (Temporal)   Ht 4' 8 (1.422 m)   Wt 170 lb 9.6 oz (77.4 kg)   LMP 07/16/2012   BMI 38.25 kg/m  General:   Alert and oriented. No distress noted. Pleasant and cooperative.  Head:  Normocephalic and atraumatic. Eyes:  Conjuctiva clear without scleral icterus. Mouth:  Oral mucosa pink and moist. Good dentition. No lesions. Heart: Normal rate and rhythm, s1 and s2 heart sounds present.  Lungs: Clear lung sounds in all lobes. Respirations equal and unlabored. Abdomen:  +BS, soft, non-tender and non-distended. No rebound or guarding. No HSM or masses noted. Derm: No palmar erythema or jaundice Msk:  Symmetrical without gross deformities. Normal posture. Extremities:  Without edema. Neurologic:  Alert and  oriented  x4 Psych:  Alert and cooperative. Normal mood and affect.  Invalid input(s): 6 MONTHS   ASSESSMENT: [name] is a 58 y.o. female presenting today for follow up IBS-C  Previously on amitiza  8mcg BID without sufficient BMs, she is currently on amitiza  24mcg BID and still going 2-3 days at times without a BM, having upwards of 4 looser stools on days she does go and some abdominal cramping prior to defecation. She did recently start mounjaro  which is likely affecting GI motility and contributing to her ongoing constipation.  At this time, I would recommend switching to a different bowel regimen, She inquired about linzess  but this unfortunately does not appear covered under her insurance, we will try trulance 3mg  daily to see how she does with this, will also add bentyl 10mg  TID PRN for abdominal cramping as well.  Would consider ibsrela if trulance is not covered or she does not respond to it.   PLAN:  -stop amitiza  -start trulance 3mg  -dicyclomine 10mg  TID PRN -start daily probiotic  -consider iBsrela if trulance not well covered or does not provide good results.   All questions were answered, patient verbalized understanding and is in agreement with plan as outlined above.    Follow Up: 4 months   Dollye Glasser L. Mariette, MSN, APRN, AGNP-C Adult-Gerontology Nurse Practitioner [facility]  I have reviewed the note and agree with the APP's assessment as described in this progress note  Toribio  Eartha, MD Gastroenterology and Hepatology [facility] Gastroenterology

## 2024-10-08 NOTE — Patient Instructions (Signed)
-  stop amitiza  -start trulance 3mg  -dicyclomine 10mg  TID PRN -start daily probiotic   Follow up 4 months  It was a pleasure to see you today. I want to create trusting relationships with patients and provide genuine, compassionate, and quality care. I truly value your feedback! please be on the lookout for a survey regarding your visit with me today. I appreciate your input about our visit and your time in completing this!    Aydden Cumpian L. Daran Favaro, MSN, APRN, AGNP-C Adult-Gerontology Nurse Practitioner [facility] Gastroenterology at [facility]

## 2024-10-09 DIAGNOSIS — K581 Irritable bowel syndrome with constipation: Secondary | ICD-10-CM | POA: Insufficient documentation

## 2024-10-12 ENCOUNTER — Encounter: Payer: Self-pay | Admitting: Radiology

## 2024-10-13 ENCOUNTER — Other Ambulatory Visit: Payer: Self-pay | Admitting: Internal Medicine

## 2024-10-13 ENCOUNTER — Other Ambulatory Visit (HOSPITAL_BASED_OUTPATIENT_CLINIC_OR_DEPARTMENT_OTHER): Payer: Self-pay

## 2024-10-13 ENCOUNTER — Ambulatory Visit

## 2024-10-13 DIAGNOSIS — F5101 Primary insomnia: Secondary | ICD-10-CM

## 2024-10-13 DIAGNOSIS — K219 Gastro-esophageal reflux disease without esophagitis: Secondary | ICD-10-CM

## 2024-10-14 ENCOUNTER — Other Ambulatory Visit: Payer: Self-pay

## 2024-10-14 ENCOUNTER — Other Ambulatory Visit (HOSPITAL_BASED_OUTPATIENT_CLINIC_OR_DEPARTMENT_OTHER): Payer: Self-pay

## 2024-10-14 MED ORDER — OMEPRAZOLE 40 MG PO CPDR
40.0000 mg | DELAYED_RELEASE_CAPSULE | Freq: Every day | ORAL | 1 refills | Status: AC
  Filled 2024-10-14: qty 90, 90d supply, fill #0
  Filled 2024-10-30: qty 90, 90d supply, fill #1

## 2024-10-14 MED ORDER — GABAPENTIN 600 MG PO TABS
600.0000 mg | ORAL_TABLET | Freq: Three times a day (TID) | ORAL | 2 refills | Status: AC
  Filled 2024-10-14: qty 90, 30d supply, fill #0
  Filled 2024-10-30: qty 90, 30d supply, fill #1
  Filled 2024-11-27: qty 180, 60d supply, fill #1

## 2024-10-14 MED ORDER — SYNJARDY 12.5-500 MG PO TABS
1.0000 | ORAL_TABLET | Freq: Every day | ORAL | 0 refills | Status: DC
  Filled 2024-10-14: qty 60, 60d supply, fill #0

## 2024-10-14 MED ORDER — ZOLPIDEM TARTRATE 5 MG PO TABS
5.0000 mg | ORAL_TABLET | Freq: Every evening | ORAL | 3 refills | Status: AC | PRN
  Filled 2024-10-14 – 2024-12-25 (×4): qty 30, 30d supply, fill #0

## 2024-10-15 ENCOUNTER — Other Ambulatory Visit (HOSPITAL_BASED_OUTPATIENT_CLINIC_OR_DEPARTMENT_OTHER): Payer: Self-pay

## 2024-10-20 ENCOUNTER — Ambulatory Visit

## 2024-10-20 DIAGNOSIS — G629 Polyneuropathy, unspecified: Secondary | ICD-10-CM | POA: Diagnosis not present

## 2024-10-20 DIAGNOSIS — L84 Corns and callosities: Secondary | ICD-10-CM | POA: Diagnosis not present

## 2024-10-20 DIAGNOSIS — T148XXA Other injury of unspecified body region, initial encounter: Secondary | ICD-10-CM

## 2024-10-20 DIAGNOSIS — L280 Lichen simplex chronicus: Secondary | ICD-10-CM

## 2024-10-20 MED ORDER — TRIAMCINOLONE ACETONIDE 0.1 % EX OINT
TOPICAL_OINTMENT | CUTANEOUS | 5 refills | Status: AC
Start: 2024-10-20 — End: ?

## 2024-10-20 NOTE — Progress Notes (Signed)
    Subjective   [name] is a 58 y.o. female who presents for the following: Rash. Patient is new patient  Today patient reports: Rash on the bilateral lower extremities that is itchy. Describes as burning and abnormal sensation in skin.  Has used Clobetasol  ointment, oral Fluconazole , Mupirocin  ointment, and Hibiclens  wash.   Review of Systems:    No other skin or systemic complaints except as noted in HPI or Assessment and Plan.  The following portions of the chart were reviewed this encounter and updated as appropriate: medications, allergies, medical history  Relevant Medical History:  n/a   Objective  (SKPE) Well appearing patient in no apparent distress; mood and affect are within normal limits. Examination was performed of the: Focused Exam of: bilateral lower extremities   Examination notable for:  Between Right 2nd and 3rd digit - corn/callous  Excoriated papules of bilateral ankles, dorsal feet L dorsal foot lichenified plaque  Examination limited by: Clothing and Patient deferred removal       Assessment & Plan  (SKAP)   Corn involving the right 2nd and 3rd digit  Chronic and persistent condition with duration or expected duration over one year. Condition is symptomatic/ bothersome to patient. Not currently at goal. Discussed intractable plantar keratoses, mechanical causes, remedies (shoes, padding, orthotics, surgery).  Referral for podiatry placed  Excoriation, lichen simplex chronicus bilateral feet/ankles - exacerbated by underlying neuropathy iso diabetes  Chronic and persistent condition with duration or expected duration over one year. Condition is symptomatic/ bothersome to patient. Not currently at goal. start triamcinolone  ointment 0.1% twice daily to affected areas of skin Discussed side effect of potent topical steroids including atrophy, dyspigmentation, striae, telangectasia, folliculitis, loss of skin pigment, hair growth, tachyphylaxis, risk of  systemic absorption with missuse. Start over the counter Sarna cream apply topically daily as needed for itching and burning  Recommend follow up with PCP/Endocrine to discuss diabetes management, titrating gabapentin  dose   Level of service outlined above   Patient instructions (SKPI)   Procedures, orders, diagnosis for this visit:  CORNS AND CALLOSITIES   Related Procedures Ambulatory referral to Podiatry  Corns and callosities -     Ambulatory referral to Podiatry  Other orders -     Triamcinolone  Acetonide; Apply 1 gram twice daily to affected areas of skin. Stop once resolved and restart as needed for flares. Avoid use on face, armpits, groin unless otherwise indicated.  Dispense: 30 g; Refill: 5    Return to clinic: Return if symptoms worsen or fail to improve.  I, Emerick Ege, CMA am acting as scribe for Lauraine JAYSON Kanaris, MD.   Documentation: I have reviewed the above documentation for accuracy and completeness, and I agree with the above.  Lauraine JAYSON Kanaris, MD

## 2024-10-20 NOTE — Patient Instructions (Addendum)

## 2024-10-21 ENCOUNTER — Telehealth: Payer: Self-pay | Admitting: "Endocrinology

## 2024-10-21 NOTE — Telephone Encounter (Signed)
 error

## 2024-10-22 ENCOUNTER — Ambulatory Visit: Admitting: Podiatry

## 2024-10-22 ENCOUNTER — Telehealth: Payer: Self-pay

## 2024-10-22 ENCOUNTER — Ambulatory Visit

## 2024-10-22 DIAGNOSIS — M7751 Other enthesopathy of right foot: Secondary | ICD-10-CM | POA: Diagnosis not present

## 2024-10-22 DIAGNOSIS — M2041 Other hammer toe(s) (acquired), right foot: Secondary | ICD-10-CM

## 2024-10-22 DIAGNOSIS — M79671 Pain in right foot: Secondary | ICD-10-CM | POA: Diagnosis not present

## 2024-10-22 MED ORDER — DEXAMETHASONE SODIUM PHOSPHATE 120 MG/30ML IJ SOLN
2.0000 mg | Freq: Once | INTRAMUSCULAR | Status: AC
  Administered 2024-10-22: 2 mg

## 2024-10-22 NOTE — Progress Notes (Signed)
 Patient presents with complaint of severe pain on the second toe and some on the third toe of the right foot.  Began several weeks ago to go and is getting worse.  Has has not noticed any redness or drainage.  Bothers her most when wearing shoes and being active.  Does not recall any injury to the toes.  Physical exam:  General appearance: Pleasant, and in no acute distress. AOx3.  Vascular: Pedal pulses: DP 2/4 bilaterally, PT 2/4 bilaterally.  Moderate edema lower legs bilaterally. Capillary fill time immediate bilaterally.  Neurological: Light touch intact feet bilaterally.  Normal Achilles reflex bilaterally.  No clonus or spasticity noted.   Dermatologic:   Skin normal temperature bilaterally.  Skin normal color, tone, and texture bilaterally.   Musculoskeletal: Hammertoes 2 through 5 bilaterally.  Tenderness on the lateral aspect of the DIPJ of the second toe and some at the medial aspect of the DIPJ of the third toe right.  Hallux abductovalgus deformity right  Radiographs: 3 views foot right: Hammertoes 2 through 5.  Mild to moderate hallux valgus deformity.  No ascending osteomyelitis or bone tumors.  Normal bone density.  Diagnosis: 1.  Pain right foot. 2.  Bursitis second toe right and third toe right. 3.  Hammertoes right foot  Plan: -New patient office visit for evaluation and management level 3.  Modifier 25. - Discussed with her the hammertoe deformities and the resulting bursitis resulting from the impingement of the 2nd and 3rd toes on each other she also has a hallux abductovalgus deformity which is probably increasing the problem with pressure of the second toe on the third toe.  Discussed treatment options.  Wear shoes with a wide toebox.  Dispensed a sleeve for cushioning on the second toe.  -injected 1.0cc mixture of 0.5cc 0.5% Marcaine  and  0.5cc Dexamethasone  sodium phosphate  USP at plain bursa lateral DIPJ of second toe right.   Return 2 weeks follow-up  injection second toe right

## 2024-10-22 NOTE — Telephone Encounter (Signed)
 Pt called stating she's experiencing burning sensation, throbbing and itching of her feet. States she has been scratching and has developed sores. States the worst being inbetween her R second and third toe. States her PCP has sent her to a dermatologist and now has been referred to a Podiatrist. Pt asked for your opinion. States she is taking Gabapentin  600mg  tid, Synjardy  12.5-500mg  daily and injects Mounjaro  2.5mg  weekly.

## 2024-10-22 NOTE — Telephone Encounter (Signed)
 Spoke with pt advising her Dr.Nida did not have any additional opinion other than to follow dermatologist recommendation and keep appt with Podiatrist. Requested glucose readings for the past 3 days, pt will call back with readings when she's able.

## 2024-10-23 ENCOUNTER — Ambulatory Visit: Admitting: Internal Medicine

## 2024-10-30 ENCOUNTER — Encounter: Payer: Self-pay | Admitting: Podiatry

## 2024-10-30 ENCOUNTER — Other Ambulatory Visit: Payer: Self-pay | Admitting: Internal Medicine

## 2024-10-30 ENCOUNTER — Other Ambulatory Visit (HOSPITAL_BASED_OUTPATIENT_CLINIC_OR_DEPARTMENT_OTHER): Payer: Self-pay

## 2024-10-30 ENCOUNTER — Ambulatory Visit: Admitting: Podiatry

## 2024-10-30 DIAGNOSIS — M25572 Pain in left ankle and joints of left foot: Secondary | ICD-10-CM

## 2024-10-30 DIAGNOSIS — R42 Dizziness and giddiness: Secondary | ICD-10-CM

## 2024-10-30 MED ORDER — METHYLPREDNISOLONE 4 MG PO TBPK: ORAL_TABLET | ORAL | 0 refills | Status: AC

## 2024-10-30 MED ORDER — SYNJARDY 12.5-500 MG PO TABS
1.0000 | ORAL_TABLET | Freq: Every day | ORAL | 0 refills | Status: DC
  Filled 2024-10-30: qty 60, 60d supply, fill #0

## 2024-10-30 MED ORDER — SIMVASTATIN 40 MG PO TABS
40.0000 mg | ORAL_TABLET | Freq: Every evening | ORAL | 3 refills | Status: AC
  Filled 2024-10-30 – 2024-12-25 (×2): qty 90, 90d supply, fill #0

## 2024-10-30 MED ORDER — MECLIZINE HCL 25 MG PO TABS
25.0000 mg | ORAL_TABLET | Freq: Two times a day (BID) | ORAL | 0 refills | Status: DC | PRN
  Filled 2024-10-30 – 2024-12-01 (×2): qty 30, 15d supply, fill #0

## 2024-10-30 NOTE — Progress Notes (Signed)
 Patient presents follow-up injection second toe right.  Doing a little bit better not as much pain as before but still bothering her quite a bit by the end of the day it still is hurting more.  Usually wears an athletic shoe at work.  He is on Birkenstocks today.   Physical exam:  General appearance: Pleasant, and in no acute distress. AOx3.  Vascular: Pedal pulses: DP 2/4 bilaterally, PT 2/4 bilaterally.  Moderate edema lower legs bilaterally. Capillary fill time immediate bilaterally.  Neurological: Grossly intact bilaterally  Dermatologic:   Skin normal temperature bilaterally.  Skin normal color, tone, and texture bilaterally.   Musculoskeletal: Hammertoe 2nd and 3rd toe right.  Hallux valgus deformity right. Tenderness at lateral aspect D IPJ second toe right and also pain at the dorsal PIPJ of the second toe right   Diagnosis: 1.  Arthralgia IPJ's second toe right  Plan: -Established office visit for evaluation and management level 3. - Discussed her continued pain in the toe.  This injection helped a little bit but is still painful with wearing shoes.  Has been using the sleeve that we gave her last time.  Will try another type of sleep today.  Also try some oral prednisone  with a Medrol  Dosepak and see if this improves.  If she does not get any improvement she is probably looking at a surgical option -Rx Medrol  Dosepak take as directed   Return 2 weeks follow-up painful second toe right

## 2024-10-30 NOTE — Telephone Encounter (Signed)
     Date Before breakfast Before lunch Before supper Bedtime  11/19 98 [phone]   11/20 [phone]   11/21 135

## 2024-11-02 NOTE — Telephone Encounter (Signed)
 Spoke with pt making her aware there are no changes necessary regarding her blood glucose readings per Dr. Lenis. Pt states she did see her Podiatrist, he recommended she contact us  again regarding her feet. She's experiencing itching on the top of her bilateral feet, left foot worse than the right. States she has scratched until she has developed sores on the top of her feet. Pt states she has not changed anything and has not started any new medications. She has not seen a dermatologist as of yet. Please advise.

## 2024-11-02 NOTE — Telephone Encounter (Signed)
 Left a message requesting pt to return call to the office

## 2024-11-03 ENCOUNTER — Encounter: Payer: Self-pay | Admitting: "Endocrinology

## 2024-11-03 NOTE — Telephone Encounter (Signed)
 Spoke with pt, she has sent images through MyChart.

## 2024-11-03 NOTE — Telephone Encounter (Signed)
 See pics below

## 2024-11-03 NOTE — Telephone Encounter (Signed)
 Spoke with pt making her aware Dr. Lenis stated the lesions on her feet seen in her pictures are not due to her diabetes or diabetic medications and he suggests seeing a dermatologist. Pt voiced understanding. Pt has an appt on 11/10/24 for follow up of her diabetes, her feet will also be evaluated again.

## 2024-11-10 ENCOUNTER — Encounter: Payer: Self-pay | Admitting: "Endocrinology

## 2024-11-10 ENCOUNTER — Ambulatory Visit: Admitting: "Endocrinology

## 2024-11-10 ENCOUNTER — Other Ambulatory Visit (HOSPITAL_BASED_OUTPATIENT_CLINIC_OR_DEPARTMENT_OTHER): Payer: Self-pay

## 2024-11-10 VITALS — BP 104/62 | HR 60 | Ht <= 58 in | Wt 171.6 lb

## 2024-11-10 DIAGNOSIS — E1169 Type 2 diabetes mellitus with other specified complication: Secondary | ICD-10-CM

## 2024-11-10 DIAGNOSIS — Z794 Long term (current) use of insulin: Secondary | ICD-10-CM

## 2024-11-10 DIAGNOSIS — Z7985 Long-term (current) use of injectable non-insulin antidiabetic drugs: Secondary | ICD-10-CM

## 2024-11-10 DIAGNOSIS — I10 Essential (primary) hypertension: Secondary | ICD-10-CM

## 2024-11-10 DIAGNOSIS — E782 Mixed hyperlipidemia: Secondary | ICD-10-CM

## 2024-11-10 LAB — POCT GLYCOSYLATED HEMOGLOBIN (HGB A1C)

## 2024-11-10 MED ORDER — TIRZEPATIDE 5 MG/0.5ML ~~LOC~~ SOAJ
5.0000 mg | SUBCUTANEOUS | 1 refills | Status: AC
  Filled 2024-11-10: qty 6, 84d supply, fill #0
  Filled 2024-11-23 – 2024-12-25 (×3): qty 2, 28d supply, fill #0

## 2024-11-10 NOTE — Progress Notes (Signed)
 11/10/2024, 4:33 PM   Endocrinology follow-up note  Subjective:    Patient ID: [name], female    DOB: Apr 07, 1966.  [name] is being seen in follow up after she was seen in consultation for management of currently uncontrolled symptomatic diabetes requested by  Tobie Suzzane POUR, MD.   Past Medical History:  Diagnosis Date   Diabetes mellitus    insulin  pump 2013   Dysphagia 03/09/2020   Epidural hematoma (HCC) 12/14/2021   GERD (gastroesophageal reflux disease)    History of kidney stones    Hypercholesteremia    Hypertension    Incarcerated umbilical hernia 03/25/2014   Kidney stone    Kidney stones 08/31/2008   Qualifier: Diagnosis of  By: Karren MD, Cornelius     Neuropathy    Obesity    Palpitations    Shortness of breath    Vertigo     Past Surgical History:  Procedure Laterality Date   BIOPSY  06/23/2020   Procedure: BIOPSY;  Surgeon: Shaaron Lamar HERO, MD;  [facility];  Service: Endoscopy;;   CARDIAC CATHETERIZATION  12/11/2012   normal coronary arteries   CESAREAN SECTION  1994;2000   [facility]   CHOLECYSTECTOMY  1992   COLONOSCOPY WITH PROPOFOL  N/A 06/23/2020   large amount of stool in entire colon, prep inadequate.    COLONOSCOPY WITH PROPOFOL  N/A 01/03/2024   Procedure: COLONOSCOPY WITH PROPOFOL ;  Surgeon: Eartha Angelia Sieving, MD;  [facility];  Service: Gastroenterology;  Laterality: N/A;  2:00pm;asa 1   CYSTOSCOPY W/ URETERAL STENT PLACEMENT  05/08/2012   Procedure: CYSTOSCOPY WITH RETROGRADE PYELOGRAM/URETERAL STENT PLACEMENT;  Surgeon: Emery LILLETTE Blaze, MD;  Location: [facility];  Service: Urology;  Laterality: Right;   CYSTOSCOPY WITH RETROGRADE PYELOGRAM, URETEROSCOPY AND STENT PLACEMENT Right 05/04/2020   Procedure: CYSTOSCOPY WITH RIGHT RETROGRADE PYELOGRAM, RIGHT URETEROSCOPY AND RIGHT URETERAL STENT EXCHANGE;  Surgeon: Sherrilee Belvie CROME, MD;  Location: [facility];  Service: Urology;  Laterality:  Right;   CYSTOSCOPY WITH RETROGRADE PYELOGRAM, URETEROSCOPY AND STENT PLACEMENT Left 08/08/2020   Procedure: CYSTOSCOPY WITH RETROGRADE PYELOGRAM, URETEROSCOPY WITH LASER  AND STENT PLACEMENT;  Surgeon: Sherrilee Belvie CROME, MD;  Location: [facility];  Service: Urology;  Laterality: Left;   CYSTOSCOPY WITH RETROGRADE PYELOGRAM, URETEROSCOPY AND STENT PLACEMENT Right 11/08/2020   Procedure: CYSTOSCOPY WITH RIGHT RETROGRADE PYELOGRAM, RIGHT URETEROSCOPY AND STENT PLACEMENT;  Surgeon: Sherrilee Belvie CROME, MD;  Location: [facility];  Service: Urology;  Laterality: Right;   CYSTOSCOPY WITH RETROGRADE PYELOGRAM, URETEROSCOPY AND STENT PLACEMENT Left 12/27/2020   Procedure: CYSTOSCOPY WITH LEFT RETROGRADE PYELOGRAM, LEFT URETEROSCOPY AND LEFT URETERAL STENT PLACEMENT;  Surgeon: Sherrilee Belvie CROME, MD;  Location: [facility];  Service: Urology;  Laterality: Left;   CYSTOSCOPY WITH RETROGRADE PYELOGRAM, URETEROSCOPY AND STENT PLACEMENT Bilateral 03/16/2021   Procedure: CYSTOSCOPY WITH BILATERAL  RETROGRADE PYELOGRAM, BILATERAL URETEROSCOPY AND STENT PLACEMENT ON THE LEFT;  Surgeon: Sherrilee Belvie CROME, MD;  [facility]: [facility];  Service: Urology;  Laterality: Bilateral;   CYSTOSCOPY WITH RETROGRADE PYELOGRAM, URETEROSCOPY AND STENT PLACEMENT Left 06/28/2022   Procedure: CYSTOSCOPY WITH RETROGRADE PYELOGRAM, URETEROSCOPY AND STENT PLACEMENT;  Surgeon: Sherrilee Belvie CROME, MD;  [facility]: [facility];  Service: Urology;  Laterality: Left;   CYSTOSCOPY WITH RETROGRADE PYELOGRAM, URETEROSCOPY AND STENT PLACEMENT Bilateral 09/27/2022   Procedure: CYSTOSCOPY WITH BILATERAL RETROGRADE PYELOGRAM, URETEROSCOPY AND STENT PLACEMENT;  Surgeon: Sherrilee Belvie CROME, MD;  Location: [facility];  Service: Urology;  Laterality: Bilateral;  pt  knows to arrive at 6:00   CYSTOSCOPY WITH STENT PLACEMENT Right 02/25/2020   Procedure: CYSTOSCOPY WITH RIGHT RETROGRADE RIGHT STENT PLACEMENT;  Surgeon: Nieves Cough, MD;  Location: [facility];  Service: Urology;   Laterality: Right;   ESOPHAGOGASTRODUODENOSCOPY (EGD) WITH PROPOFOL  N/A 06/23/2020   Normal esophagus, multiple localized erosions were found in gastric antrum s/p biopsy, normal duodenum. Empiric dilation.Reactive gastropathy with erosions, negative H.pylori, no dysplasia.     EXTRACORPOREAL SHOCK WAVE LITHOTRIPSY Right 10/11/2020   Procedure: EXTRACORPOREAL SHOCK WAVE LITHOTRIPSY (ESWL);  Surgeon: Sherrilee Belvie CROME, MD;  Location: [facility];  Service: Urology;  Laterality: Right;   FOOT SURGERY Right March, 2013   [facility]-removal of bone spur   HOLMIUM LASER APPLICATION  05/04/2020   Procedure: HOLMIUM LASER LITHOTRIPSY RIGHT URETERAL CALCULUS;  Surgeon: Sherrilee Belvie CROME, MD;  Location: [facility];  Service: Urology;;   HYSTEROSCOPY WITH THERMACHOICE  04/25/2012   Procedure: HYSTEROSCOPY WITH THERMACHOICE;  Surgeon: Vonn VEAR Inch, MD;  Location: [facility];  Service: Gynecology;  Laterality: N/A;  total therapy time= 11 minutes 42 seconds; 34 ml D5w  in and 34 ml D5w out; temp =87 degrees F   LEFT HEART CATHETERIZATION WITH CORONARY ANGIOGRAM N/A 12/11/2012   Procedure: LEFT HEART CATHETERIZATION WITH CORONARY ANGIOGRAM;  Surgeon: Dorn JINNY Lesches, MD;  Location[facility] CATH LAB;  Service: Cardiovascular;  Laterality: N/A;   LUMBAR WOUND DEBRIDEMENT N/A 12/14/2021   Procedure: LUMBAR HEMATOMA EVACUATION;  Surgeon: Cheryle Debby LABOR, MD;  Location: [facility];  Service: Neurosurgery;  Laterality: N/A;   MALONEY DILATION N/A 06/23/2020   Procedure: AGAPITO DILATION;  Surgeon: Shaaron Lamar HERO, MD;  [facility];  Service: Endoscopy;  Laterality: N/A;   Sinus sergery  1988   [place]   STONE EXTRACTION WITH BASKET Right 11/08/2020   Procedure: STONE EXTRACTION WITH BASKET;  Surgeon: Sherrilee Belvie CROME, MD;  Location: [facility];  Service: Urology;  Laterality: Right;   TRANSFORAMINAL LUMBAR INTERBODY FUSION (TLIF) WITH PEDICLE SCREW FIXATION 1 LEVEL N/A 12/05/2021   Procedure: Open Lumbar five-Sacral  one Laminectomy/Transforaminal Lumbar Interbody Fusion/Posterolateral instrumented fusion;  Surgeon: Cheryle Debby LABOR, MD;  Location: [facility];  Service: Neurosurgery;  Laterality: N/A;  3C   UMBILICAL HERNIA REPAIR N/A 03/25/2014   Procedure: UMBILICAL HERNIA REPAIR;  Surgeon: Elsie GORMAN Holland, MD;  Location: [facility];  Service: General;  Laterality: N/A;   uterine ablation      Social History   Socioeconomic History   Marital status: Married    Spouse name: Not on file   Number of children: 2   Years of education: college   Highest education level: 12th grade  Occupational History   Occupation: Scientist, Research (medical): Audiological Scientist: [employer]   Occupation: LAWYER- at [employer]  Tobacco Use   Smoking status: Never   Smokeless tobacco: Never  Vaping Use   Vaping status: Never Used  Substance and Sexual Activity   Alcohol use: No   Drug use: No   Sexual activity: Yes    Partners: Male    Birth control/protection: Post-menopausal    Comment: ablation  Other Topics Concern   Not on file  Social History Narrative   Regular exercise: walks Caffeine use:    Social Drivers of Corporate Investment Banker Strain: Low Risk  (02/05/2024)   Overall Financial Resource Strain (CARDIA)    Difficulty of Paying Living Expenses: Not hard at all  Food Insecurity: No  Food Insecurity (02/05/2024)   Hunger Vital Sign    Worried About Running Out of Food in the Last Year: Never true    Ran Out of Food in the Last Year: Never true  Transportation Needs: No Transportation Needs (02/05/2024)   PRAPARE - Administrator, Civil Service (Medical): No    Lack of Transportation (Non-Medical): No  Physical Activity: Insufficiently Active (02/05/2024)   Exercise Vital Sign    Days of Exercise per Week: 3 days    Minutes of Exercise per Session: 30 min  Stress: No Stress Concern Present (02/05/2024)   [employer] of Occupational Health - Occupational Stress  Questionnaire    Feeling of Stress : Only a little  Recent Concern: Stress - Stress Concern Present (12/27/2023)   [employer] of Occupational Health - Occupational Stress Questionnaire    Feeling of Stress : To some extent  Social Connections: Moderately Integrated (02/05/2024)   Social Connection and Isolation Panel    Frequency of Communication with Friends and Family: More than three times a week    Frequency of Social Gatherings with Friends and Family: Three times a week    Attends Religious Services: More than 4 times per year    Active Member of Clubs or Organizations: No    Attends Banker Meetings: Never    Marital Status: Married    Family History  Problem Relation Age of Onset   Breast cancer Mother    Hypertension Mother    Cancer Mother    Diabetes Mother    Stroke Mother    Alzheimer's disease Mother    Diabetes Father    Breast cancer Sister    Cancer Paternal Uncle    Breast cancer Maternal Grandmother    Breast cancer Paternal Grandmother    Depression Paternal Grandmother    Depression Paternal Grandfather    Heart disease Other    Cancer Other    Diabetes Other    Achalasia Other    Anesthesia problems Neg Hx    Hypotension Neg Hx    Malignant hyperthermia Neg Hx    Pseudochol deficiency Neg Hx    Colon cancer Neg Hx    Colon polyps Neg Hx     Outpatient Encounter Medications as of 11/10/2024  Medication Sig   tirzepatide  (MOUNJARO ) 5 MG/0.5ML Pen Inject 5 mg into the skin once a week.   albuterol  (VENTOLIN  HFA) 108 (90 Base) MCG/ACT inhaler Inhale 2 puffs into the lungs every 6 (six) hours as needed for wheezing or shortness of breath.   blood glucose meter kit and supplies Use up to four times daily as directed.   chlorhexidine  (HIBICLENS ) 4 % external liquid Apply topically daily as needed.   citalopram  (CELEXA ) 10 MG tablet Take 1 tablet (10 mg total) by mouth daily.   clobetasol  ointment (TEMOVATE ) 0.05 % Apply 1 Application  topically 2 (two) times daily.   cyclobenzaprine  (FLEXERIL ) 10 MG tablet Take 1 tablet (10 mg total) by mouth 3 (three) times daily as needed for muscle spasms.   dicyclomine  (BENTYL ) 10 MG capsule Take 1 capsule (10 mg total) by mouth 3 (three) times daily as needed for spasms.   fluconazole  (DIFLUCAN ) 150 MG tablet Take 1 tablet (150 mg total) by mouth once a week.   furosemide  (LASIX ) 20 MG tablet Take 20 mg by mouth 2 (two) times daily as needed for edema.   gabapentin  (NEURONTIN ) 600 MG tablet Take 1 tablet (600 mg total) by mouth  3 (three) times daily.   glucose blood test strip Test up to 4 times a day   hydrOXYzine  (VISTARIL ) 25 MG capsule Take 1 capsule (25 mg total) by mouth every 8 (eight) hours as needed.   ibuprofen  (ADVIL ) 800 MG tablet Take 1 tablet (800 mg total) by mouth every 8 (eight) hours as needed.   Lancets (FREESTYLE) lancets Use to test 4 times daily   lisinopril -hydrochlorothiazide  (ZESTORETIC ) 20-12.5 MG tablet Take 1 tablet by mouth daily.   lubiprostone  (AMITIZA ) 24 MCG capsule Take 1 capsule (24 mcg total) by mouth 2 (two) times daily with a meal.   meclizine  (ANTIVERT ) 25 MG tablet Take 1 tablet (25 mg total) by mouth 2 (two) times daily as needed for dizziness.   meloxicam  (MOBIC ) 7.5 MG tablet Take 1 tablet (7.5 mg total) by mouth daily.   methylPREDNISolone  (MEDROL  DOSEPAK) 4 MG TBPK tablet 6 day dose pack - take as directed   mupirocin  ointment (BACTROBAN ) 2 % Apply 1 Application topically 2 (two) times daily.   omeprazole  (PRILOSEC) 40 MG capsule Take 1 capsule (40 mg total) by mouth daily.   ondansetron  (ZOFRAN ) 4 MG tablet Take 1 tablet (4 mg total) by mouth daily as needed for nausea or vomiting.   oxybutynin  (DITROPAN -XL) 5 MG 24 hr tablet Take 1 tablet (5 mg total) by mouth at bedtime.   oxyCODONE -acetaminophen  (PERCOCET) 5-325 MG tablet Take 1 tablet by mouth every 4 (four) hours as needed.   Plecanatide  (TRULANCE ) 3 MG TABS Take 1 tablet (3 mg total) by  mouth daily.   Potassium Citrate  (UROCIT-K  15) 15 MEQ (1620 MG) TBCR Take 1 tablet by mouth 2 (two) times daily.   promethazine  (PHENERGAN ) 12.5 MG tablet Take 1 tablet (12.5 mg total) by mouth every 6 (six) hours as needed for nausea or vomiting.   promethazine  (PHENERGAN ) 25 MG tablet TAKE 1 TABLET(25 MG) BY MOUTH EVERY 8 HOURS AS NEEDED FOR NAUSEA OR VOMITING   simvastatin  (ZOCOR ) 40 MG tablet Take 1 tablet (40 mg total) by mouth every evening for cholesterol   triamcinolone  ointment (KENALOG ) 0.1 % Apply 1 gram twice daily to affected areas of skin. Stop once resolved and restart as needed for flares. Avoid use on face, armpits, groin unless otherwise indicated.   zolpidem  (AMBIEN ) 5 MG tablet Take 1 tablet (5 mg total) by mouth at bedtime as needed for sleep.   [DISCONTINUED] Empagliflozin -metFORMIN  HCl (SYNJARDY ) 12.5-500 MG TABS Take 1 tablet by mouth daily.   [DISCONTINUED] tirzepatide  (MOUNJARO ) 2.5 MG/0.5ML Pen Inject 2.5 mg into the skin once a week.   No facility-administered encounter medications on file as of 11/10/2024.    ALLERGIES: Allergies  Allergen Reactions   Ciprofloxacin  Hives and Swelling   Penicillins Anaphylaxis and Rash   Codeine Other (See Comments)    had hallicunations   Morphine  Nausea And Vomiting and Other (See Comments)    recieved in ED due to [facility] and had multple doses - gave visual hallucinations and vomitting.   Sulfonamide Derivatives Other (See Comments)    REACTION: Unsure - childhood allergy   Vancomycin  Itching    VACCINATION STATUS: Immunization History  Administered Date(s) Administered   H1N1 09/29/2008   Influenza Split 08/25/2012   Influenza Whole 08/31/2008   Influenza,inj,Quad PF,6+ Mos 10/19/2013, 08/30/2021, 09/13/2022, 09/26/2023   Moderna Covid-19 Vaccine Bivalent Booster 33yrs & up 09/09/2021   Moderna Sars-Covid-2 Vaccination 01/05/2020, 02/02/2020   Pneumococcal Polysaccharide-23 09/14/2008, 02/21/2014   Tdap  06/28/2020   Zoster Recombinant(Shingrix ) 04/04/2022,  10/08/2022    Diabetes She presents for her follow-up diabetic visit. She has type 2 diabetes mellitus. Her disease course has been improving. Pertinent negatives for hypoglycemia include no confusion, headaches, pallor or seizures. Pertinent negatives for diabetes include no chest pain, no polydipsia, no polyphagia and no polyuria. Symptoms are improving. There are no diabetic complications. Risk factors for coronary artery disease include dyslipidemia, diabetes mellitus, obesity, sedentary lifestyle and hypertension. Her weight is decreasing steadily. She is following a generally unhealthy diet. When asked about meal planning, she reported none. Her home blood glucose trend is decreasing steadily. Her breakfast blood glucose range is generally 110-130 mg/dl. Her overall blood glucose range is 110-130 mg/dl. (She presents with controlled glycemic profile.  Her point-of-care A1c is 5.8%, overall improving from 10.2%.  She was taken off of insulin .  She is tolerating her Mounjaro  Synjardy .    ) An ACE inhibitor/angiotensin II receptor blocker is being taken.  Hyperlipidemia This is a chronic problem. Exacerbating diseases include diabetes. Pertinent negatives include no chest pain, myalgias or shortness of breath. Current antihyperlipidemic treatment includes statins. Risk factors for coronary artery disease include diabetes mellitus, dyslipidemia, hypertension, obesity, family history and a sedentary lifestyle.  Hypertension This is a chronic problem. The current episode started more than 1 year ago. The problem is controlled. Pertinent negatives include no chest pain, headaches, palpitations or shortness of breath. Risk factors for coronary artery disease include dyslipidemia, diabetes mellitus, post-menopausal state, sedentary lifestyle, family history and obesity. Past treatments include ACE inhibitors and diuretics.   ROS: Patient complains of  pruritic rash on bilateral ankles.   Objective:       11/10/2024    3:14 PM 10/08/2024    3:22 PM 08/31/2024    2:26 PM  Vitals with BMI  Height 4' 8 4' 8   Weight 171 lbs 10 oz 170 lbs 10 oz   BMI 38.49 38.27   Systolic 104 110 889  Diastolic 62 56 60  Pulse 60 83 76    BP 104/62   Pulse 60   Ht 4' 8 (1.422 m)   Wt 171 lb 9.6 oz (77.8 kg)   LMP 07/16/2012   BMI 38.47 kg/m   Wt Readings from Last 3 Encounters:  11/10/24 171 lb 9.6 oz (77.8 kg)  10/08/24 170 lb 9.6 oz (77.4 kg)  08/26/24 168 lb (76.2 kg)    Pruritic papules with excoriation marks and scratch marks on skin of bilateral ankle .  CMP ( most recent) CMP     Component Value Date/Time   NA 139 05/05/2024 1109   K 4.1 05/05/2024 1109   CL 99 05/05/2024 1109   CO2 24 05/05/2024 1109   GLUCOSE 124 (H) 05/05/2024 1109   GLUCOSE 113 (H) 09/25/2022 1449   BUN 14 05/05/2024 1109   CREATININE 0.78 05/05/2024 1109   CREATININE 0.89 05/25/2020 1626   CALCIUM 9.2 05/05/2024 1109   PROT 6.4 05/05/2024 1109   ALBUMIN  3.9 05/05/2024 1109   AST 21 05/05/2024 1109   ALT 21 05/05/2024 1109   ALKPHOS 113 05/05/2024 1109   BILITOT 0.6 05/05/2024 1109   GFRNONAA >60 09/25/2022 1449   GFRAA >60 08/05/2020 1248    Diabetic Labs (most recent): Lab Results  Component Value Date   HGBA1C 5.7 05/06/2024   HGBA1C 6.5 05/29/2023   HGBA1C 6.2 (H) 09/25/2022   MICROALBUR 10 11/27/2022   MICROALBUR 1.10 11/14/2012     Lipid Panel ( most recent) Lipid Panel  Component Value Date/Time   CHOL 135 05/05/2024 1109   TRIG 121 05/05/2024 1109   HDL 53 05/05/2024 1109   CHOLHDL 2.5 05/05/2024 1109   CHOLHDL 4.0 07/20/2013 1100   VLDL 48 (H) 07/20/2013 1100   LDLCALC 61 05/05/2024 1109   LABVLDL 21 05/05/2024 1109      Lab Results  Component Value Date   TSH 0.909 05/05/2024   TSH 1.980 05/28/2023   TSH 1.960 02/06/2023   TSH 1.110 05/24/2022   TSH 1.438 11/18/2012   TSH 4.326 09/01/2008   FREET4 0.85  05/05/2024   FREET4 1.04 05/28/2023   FREET4 0.87 02/06/2023   FREET4 0.87 05/24/2022     Assessment & Plan:   Type 2 diabetes, unspecified complication using insulin  pump  - Vivianne D Malina has currently uncontrolled symptomatic type 2 DM since  58 years of age,.  She presents with controlled glycemic profile.  Point-of-care A1c is 5.8% generally improving from 10.2%.     She has not achieved significant excess after she was taken off of insulin . - I had a long discussion with her about the progressive nature of diabetes and the pathology behind its complications.  She does not report gross complications from her diabetes, however she remains at a high risk for more acute and chronic complications which include CAD, CVA, CKD, retinopathy, and neuropathy. These are all discussed in detail with her.  - I discussed all available options of managing her diabetes including de-escalation of medications. I have counseled her on diet  and weight management  by adopting a Whole Food , Plant Predominant  ( WFPP) nutrition as recommended by [employer] of Lifestyle Medicine. Patient is encouraged to switch to  unprocessed or minimally processed  complex starch, adequate protein intake (mainly plant source), minimal liquid fat ( mainly vegetable oils), plenty of fruits, and vegetables. -  she is advised to stick to a routine mealtimes to eat 3 complete meals a day and snack only when necessary ( to snack only to correct hypoglycemia BG <70 day time or <100 at night).   - she acknowledges that there is a room for improvement in her food and drink choices. - Suggestion is made for her to avoid simple carbohydrates  from her diet including Cakes, Sweet Desserts, Ice Cream, Soda (diet and regular), Sweet Tea, Candies, Chips, Cookies, Store Bought Juices, Alcohol , Artificial Sweeteners,  Coffee Creamer, and Sugar-free Products, Lemonade. This will help patient to have more stable blood glucose profile  and potentially avoid unintended weight gain.  - she will be scheduled with Penny Crumpton, RDN, CDE for individualized diabetes education.  She has successfully came off of insulin  for more than a year now.  She has unexplained pruritic skin rash on bilateral  ankles  , adised to discontinue Synjardy  for now.  She is encouraged to continue follow-up with dermatology.   -She has tolerated Mounjaro , may benefit from a higher dose.  I discussed and prescribed Mounjaro  5 mg subcutaneously weekly.   - she is encouraged to call clinic for blood glucose levels less than 70 or above 150 mg per DL at fasting.     - Specific targets for  A1c;  LDL, HDL,  and Triglycerides were discussed with the patient.  2) Blood Pressure /Hypertension:    Her blood pressure is controlled to target.  she is advised to take half tablet of her current lisinopril /HCTZ 20-12 0.5 mg p.o. daily with breakfast .  3) Lipids/Hyperlipidemia:  Review of her recent lipid panel showed controlled lipid panel with LDL at 61.  This improvement from 113.  She is advised to continue simvastatin  40 mg p.o. daily at bedtime.  Side effects and precautions discussed with her.      4)  Weight/Diet:  Body mass index is 38.47 kg/m.  -She has lost 21 pounds so far.  Clearly complicating her diabetes care.   she is  a candidate for weight loss. I discussed with her the fact that loss of 5 - 10% of her  current body weight will have the most impact on her diabetes management.  The above detailed  [facility] recommendations for nutrition, exercise, sleep, social life, avoidance of risky substances, the need for restorative sleep   information will also detailed on discharge instructions.  5) Chronic Care/Health Maintenance:  -she  is on ACEI/ARB and Statin medications and  is encouraged to initiate and continue to follow up with Ophthalmology, Dentist,  Podiatrist at least yearly or according to recommendations, and advised to   stay away from  smoking. I have recommended yearly flu vaccine and pneumonia vaccine at least every 5 years; moderate intensity exercise for up to 150 minutes weekly; and  sleep for 7- 9 hours a day.  - she is  advised to maintain close follow up with Tobie Suzzane POUR, MD for primary care needs, as well as her other providers for optimal and coordinated care.  I spent  25  minutes in the care of the patient today including review of labs from CMP, Lipids, Thyroid  Function, Hematology (current and previous including abstractions from other facilities); face-to-face time discussing  her blood glucose readings/logs, discussing hypoglycemia and hyperglycemia episodes and symptoms, medications doses, her options of short and long term treatment based on the latest standards of care / guidelines;  discussion about incorporating lifestyle medicine;  and documenting the encounter. Risk reduction counseling performed per USPSTF guidelines to reduce  obesity and cardiovascular risk factors.     Please refer to Patient Instructions for Blood Glucose Monitoring and Insulin /Medications Dosing Guide  in media tab for additional information. Please  also refer to  Patient Self Inventory in the Media  tab for reviewed elements of pertinent patient history.  [name] participated in the discussions, expressed understanding, and voiced agreement with the above plans.  All questions were answered to her satisfaction. she is encouraged to contact clinic should she have any questions or concerns prior to her return visit.   Follow up plan: - Return in about 4 months (around 03/11/2025) for Bring Meter/CGM Device/Logs- A1c in Office.  Ranny Earl, MD [facility] [address] [place], [place] [address] Phone: [phone]  Fax: [phone]    11/10/2024, 4:33 PM  This note was partially dictated with voice recognition software. Similar sounding words can be transcribed  inadequately or may not  be corrected upon review.

## 2024-11-10 NOTE — Patient Instructions (Signed)

## 2024-11-11 ENCOUNTER — Other Ambulatory Visit (HOSPITAL_BASED_OUTPATIENT_CLINIC_OR_DEPARTMENT_OTHER): Payer: Self-pay

## 2024-11-18 ENCOUNTER — Ambulatory Visit: Admitting: Urology

## 2024-11-18 ENCOUNTER — Ambulatory Visit: Admitting: Internal Medicine

## 2024-11-19 ENCOUNTER — Ambulatory Visit: Admitting: Podiatry

## 2024-11-23 ENCOUNTER — Other Ambulatory Visit (HOSPITAL_BASED_OUTPATIENT_CLINIC_OR_DEPARTMENT_OTHER): Payer: Self-pay

## 2024-11-26 ENCOUNTER — Ambulatory Visit: Admitting: Podiatry

## 2024-11-27 ENCOUNTER — Other Ambulatory Visit: Payer: Self-pay | Admitting: Internal Medicine

## 2024-11-27 ENCOUNTER — Other Ambulatory Visit: Payer: Self-pay

## 2024-11-27 ENCOUNTER — Other Ambulatory Visit (HOSPITAL_BASED_OUTPATIENT_CLINIC_OR_DEPARTMENT_OTHER): Payer: Self-pay

## 2024-11-27 DIAGNOSIS — Z794 Long term (current) use of insulin: Secondary | ICD-10-CM

## 2024-11-30 ENCOUNTER — Other Ambulatory Visit (HOSPITAL_BASED_OUTPATIENT_CLINIC_OR_DEPARTMENT_OTHER): Payer: Self-pay

## 2024-11-30 MED ORDER — SYNJARDY 12.5-500 MG PO TABS
1.0000 | ORAL_TABLET | Freq: Every day | ORAL | 3 refills | Status: AC
  Filled 2024-11-30 – 2024-12-25 (×3): qty 90, 90d supply, fill #0

## 2024-12-01 ENCOUNTER — Other Ambulatory Visit: Payer: Self-pay

## 2024-12-01 ENCOUNTER — Other Ambulatory Visit (HOSPITAL_BASED_OUTPATIENT_CLINIC_OR_DEPARTMENT_OTHER): Payer: Self-pay

## 2024-12-01 ENCOUNTER — Other Ambulatory Visit (HOSPITAL_COMMUNITY): Payer: Self-pay

## 2024-12-07 ENCOUNTER — Ambulatory Visit

## 2024-12-13 ENCOUNTER — Telehealth: Payer: Self-pay

## 2024-12-13 ENCOUNTER — Ambulatory Visit
Admission: EM | Admit: 2024-12-13 | Discharge: 2024-12-13 | Disposition: A | Discharge status: Home / Self Care | Attending: Nurse Practitioner | Admitting: Nurse Practitioner

## 2024-12-13 DIAGNOSIS — R6889 Other general symptoms and signs: Secondary | ICD-10-CM | POA: Diagnosis not present

## 2024-12-13 DIAGNOSIS — B349 Viral infection, unspecified: Secondary | ICD-10-CM

## 2024-12-13 LAB — POCT INFLUENZA A/B
Influenza A, POC: NEGATIVE
Influenza B, POC: NEGATIVE

## 2024-12-13 MED ORDER — MECLIZINE HCL 12.5 MG PO TABS: 12.5000 mg | ORAL_TABLET | Freq: Three times a day (TID) | ORAL | 0 refills | Status: DC | PRN

## 2024-12-13 MED ORDER — OSELTAMIVIR PHOSPHATE 75 MG PO CAPS: 75.0000 mg | ORAL_CAPSULE | Freq: Two times a day (BID) | ORAL | 0 refills | Status: AC

## 2024-12-13 MED ORDER — FLUTICASONE PROPIONATE 50 MCG/ACT NA SUSP: 2.0000 | Freq: Every day | NASAL | 0 refills | Status: AC

## 2024-12-13 MED ORDER — ONDANSETRON 4 MG PO TBDP: 4.0000 mg | ORAL_TABLET | Freq: Three times a day (TID) | ORAL | 0 refills | Status: DC | PRN

## 2024-12-13 MED ORDER — ONDANSETRON 4 MG PO TBDP: 4.0000 mg | ORAL_TABLET | Freq: Three times a day (TID) | ORAL | 0 refills | Status: AC | PRN

## 2024-12-13 MED ORDER — MECLIZINE HCL 12.5 MG PO TABS: 12.5000 mg | ORAL_TABLET | Freq: Three times a day (TID) | ORAL | 0 refills | Status: AC | PRN

## 2024-12-13 MED ORDER — FLUTICASONE PROPIONATE 50 MCG/ACT NA SUSP: 2.0000 | Freq: Every day | NASAL | 0 refills | Status: DC

## 2024-12-13 MED ORDER — OSELTAMIVIR PHOSPHATE 75 MG PO CAPS: 75.0000 mg | ORAL_CAPSULE | Freq: Two times a day (BID) | ORAL | 0 refills | Status: DC

## 2024-12-13 NOTE — ED Provider Notes (Signed)
 " [facility]    CSN: [id number] Arrival date & time: 12/13/24  0843      History   Chief Complaint Chief Complaint  Patient presents with   Nasal Congestion    HPI [name] is a 59 y.o. female.   The history is provided by the patient.   Patient presents for a 1 day history of nasal congestion, chills, body aches, and nausea.  Patient denies fever, ear pain, ear drainage, cough, wheezing, difficulty breathing, abdominal pain, nausea, vomiting, diarrhea, or rash.  Patient reports that she has not vomited, but has been nauseated.  She reports that she has also had some dizziness.  States that the dizziness has been present since her symptoms started.  She endorses a prior history of vertigo.  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  So far, states she has not taken any medications for her symptoms.  States she took a COVID test at work, and that her employer sent her here for a flu test.  Past Medical History:  Diagnosis Date   Diabetes mellitus    insulin  pump 2013   Dysphagia 03/09/2020   Epidural hematoma (HCC) 12/14/2021   GERD (gastroesophageal reflux disease)    History of kidney stones    Hypercholesteremia    Hypertension    Incarcerated umbilical hernia 03/25/2014   Kidney stone    Kidney stones 08/31/2008   Qualifier: Diagnosis of  By: Karren MD, Cornelius     Neuropathy    Obesity    Palpitations    Shortness of breath    Vertigo     Patient Active Problem List   Diagnosis Date Noted   Irritable bowel syndrome with constipation 10/09/2024   Chronic idiopathic constipation 10/08/2024   Acute recurrent frontal sinusitis 08/26/2024   Urinary urgency 06/26/2024   Tinea pedis of both feet 06/26/2024   Allergic dermatitis 03/02/2024   Right-sided chest wall pain 02/14/2024   Mass of upper outer quadrant of right breast 02/05/2024   Encounter for well woman exam with routine gynecological exam 02/05/2024   Family history of  breast cancer in first degree relative 02/05/2024   Rash and nonspecific skin eruption 01/13/2024   B12 deficiency 06/17/2023   Long-term (current) use of injectable non-insulin  antidiabetic drugs 05/30/2023   Epidermoid cyst of skin of scalp 04/15/2023   Cervical spinal stenosis 01/30/2023   Nodule of finger of right hand 01/22/2023   Acute idiopathic gout 01/01/2023   Fecal incontinence 01/01/2023   Vertigo 08/09/2022   Chronic mucoid otitis media of left ear 05/16/2022   Primary insomnia 05/09/2022   ASCUS of cervix with negative high risk HPV 01/16/2022   Spondylolisthesis of lumbar region 12/05/2021   Spinal stenosis of lumbar region with neurogenic claudication 10/25/2021   Chronic bilateral low back pain with left-sided sciatica 10/18/2021   Mid back pain on right side 10/31/2020   GERD (gastroesophageal reflux disease) 03/09/2020   Other intervertebral disc degeneration, lumbar region 05/24/2019   OSA (obstructive sleep apnea) 12/08/2014   Depression 07/15/2013   Dyspnea 11/12/2012   Headache 10/14/2012   Neuropathy 10/14/2012   Essential hypertension, benign 05/10/2012   Diabetes mellitus (HCC) 09/29/2008   Mixed hyperlipidemia 09/29/2008   Class 2 severe obesity due to excess calories with serious comorbidity and body mass index (BMI) of 38.0 to 38.9 in adult 08/31/2008   Recurrent nephrolithiasis 08/31/2008    Past Surgical History:  Procedure Laterality Date   BIOPSY  06/23/2020  Procedure: BIOPSY;  Surgeon: Shaaron Lamar HERO, MD;  [facility]: [facility];  Service: Endoscopy;;   CARDIAC CATHETERIZATION  12/11/2012   normal coronary arteries   CESAREAN SECTION  1994;2000   [place]   CHOLECYSTECTOMY  1992   COLONOSCOPY WITH PROPOFOL  N/A 06/23/2020   large amount of stool in entire colon, prep inadequate.    COLONOSCOPY WITH PROPOFOL  N/A 01/03/2024   Procedure: COLONOSCOPY WITH PROPOFOL ;  Surgeon: Eartha Angelia Sieving, MD;  Location: [facility];  Service:  Gastroenterology;  Laterality: N/A;  2:00pm;asa 1   CYSTOSCOPY W/ URETERAL STENT PLACEMENT  05/08/2012   Procedure: CYSTOSCOPY WITH RETROGRADE PYELOGRAM/URETERAL STENT PLACEMENT;  Surgeon: Emery LILLETTE Blaze, MD;  Location: [facility];  Service: Urology;  Laterality: Right;   CYSTOSCOPY WITH RETROGRADE PYELOGRAM, URETEROSCOPY AND STENT PLACEMENT Right 05/04/2020   Procedure: CYSTOSCOPY WITH RIGHT RETROGRADE PYELOGRAM, RIGHT URETEROSCOPY AND RIGHT URETERAL STENT EXCHANGE;  Surgeon: Sherrilee Belvie CROME, MD;  Location: [facility];  Service: Urology;  Laterality: Right;   CYSTOSCOPY WITH RETROGRADE PYELOGRAM, URETEROSCOPY AND STENT PLACEMENT Left 08/08/2020   Procedure: CYSTOSCOPY WITH RETROGRADE PYELOGRAM, URETEROSCOPY WITH LASER  AND STENT PLACEMENT;  Surgeon: Sherrilee Belvie CROME, MD;  Location: [facility];  Service: Urology;  Laterality: Left;   CYSTOSCOPY WITH RETROGRADE PYELOGRAM, URETEROSCOPY AND STENT PLACEMENT Right 11/08/2020   Procedure: CYSTOSCOPY WITH RIGHT RETROGRADE PYELOGRAM, RIGHT URETEROSCOPY AND STENT PLACEMENT;  Surgeon: Sherrilee Belvie CROME, MD;  Location: [facility];  Service: Urology;  Laterality: Right;   CYSTOSCOPY WITH RETROGRADE PYELOGRAM, URETEROSCOPY AND STENT PLACEMENT Left 12/27/2020   Procedure: CYSTOSCOPY WITH LEFT RETROGRADE PYELOGRAM, LEFT URETEROSCOPY AND LEFT URETERAL STENT PLACEMENT;  Surgeon: Sherrilee Belvie CROME, MD;  Location: [facility];  Service: Urology;  Laterality: Left;   CYSTOSCOPY WITH RETROGRADE PYELOGRAM, URETEROSCOPY AND STENT PLACEMENT Bilateral 03/16/2021   Procedure: CYSTOSCOPY WITH BILATERAL  RETROGRADE PYELOGRAM, BILATERAL URETEROSCOPY AND STENT PLACEMENT ON THE LEFT;  Surgeon: Sherrilee Belvie CROME, MD;  Location: [facility];  Service: Urology;  Laterality: Bilateral;   CYSTOSCOPY WITH RETROGRADE PYELOGRAM, URETEROSCOPY AND STENT PLACEMENT Left 06/28/2022   Procedure: CYSTOSCOPY WITH RETROGRADE PYELOGRAM, URETEROSCOPY AND STENT PLACEMENT;  Surgeon: Sherrilee Belvie CROME, MD;  [facility];  Service: Urology;  Laterality: Left;   CYSTOSCOPY WITH RETROGRADE PYELOGRAM, URETEROSCOPY AND STENT PLACEMENT Bilateral 09/27/2022   Procedure: CYSTOSCOPY WITH BILATERAL RETROGRADE PYELOGRAM, URETEROSCOPY AND STENT PLACEMENT;  Surgeon: Sherrilee Belvie CROME, MD;  Location: [facility];  Service: Urology;  Laterality: Bilateral;  pt knows to arrive at 6:00   CYSTOSCOPY WITH STENT PLACEMENT Right 02/25/2020   Procedure: CYSTOSCOPY WITH RIGHT RETROGRADE RIGHT STENT PLACEMENT;  Surgeon: Nieves Cough, MD;  Location: [facility];  Service: Urology;  Laterality: Right;   ESOPHAGOGASTRODUODENOSCOPY (EGD) WITH PROPOFOL  N/A 06/23/2020   Normal esophagus, multiple localized erosions were found in gastric antrum s/p biopsy, normal duodenum. Empiric dilation.Reactive gastropathy with erosions, negative H.pylori, no dysplasia.     EXTRACORPOREAL SHOCK WAVE LITHOTRIPSY Right 10/11/2020   Procedure: EXTRACORPOREAL SHOCK WAVE LITHOTRIPSY (ESWL);  Surgeon: Sherrilee Belvie CROME, MD;  Location: [facility];  Service: Urology;  Laterality: Right;   FOOT SURGERY Right March, 2013   [facility]-removal of bone spur   HOLMIUM LASER APPLICATION  05/04/2020   Procedure: HOLMIUM LASER LITHOTRIPSY RIGHT URETERAL CALCULUS;  Surgeon: Sherrilee Belvie CROME, MD;  Location: [facility];  Service: Urology;;   HYSTEROSCOPY WITH THERMACHOICE  04/25/2012   Procedure: HYSTEROSCOPY WITH THERMACHOICE;  Surgeon: Vonn VEAR Inch, MD;  Location: [facility];  Service: Gynecology;  Laterality:  N/A;  total therapy time= 11 minutes 42 seconds; 34 ml D5w  in and 34 ml D5w out; temp =87 degrees F   LEFT HEART CATHETERIZATION WITH CORONARY ANGIOGRAM N/A 12/11/2012   Procedure: LEFT HEART CATHETERIZATION WITH CORONARY ANGIOGRAM;  Surgeon: Dorn JINNY Lesches, MD;  Location: [facility];  Service: Cardiovascular;  Laterality: N/A;   LUMBAR WOUND DEBRIDEMENT N/A 12/14/2021   Procedure: LUMBAR HEMATOMA EVACUATION;  Surgeon: Cheryle Debby LABOR, MD;  Location: [facility];   Service: Neurosurgery;  Laterality: N/A;   MALONEY DILATION N/A 06/23/2020   Procedure: AGAPITO DILATION;  Surgeon: Shaaron Lamar HERO, MD;  [facility];  Service: Endoscopy;  Laterality: N/A;   Sinus sergery  1988   [place]   STONE EXTRACTION WITH BASKET Right 11/08/2020   Procedure: STONE EXTRACTION WITH BASKET;  Surgeon: Sherrilee Belvie CROME, MD;  Location: [facility];  Service: Urology;  Laterality: Right;   TRANSFORAMINAL LUMBAR INTERBODY FUSION (TLIF) WITH PEDICLE SCREW FIXATION 1 LEVEL N/A 12/05/2021   Procedure: Open Lumbar five-Sacral one Laminectomy/Transforaminal Lumbar Interbody Fusion/Posterolateral instrumented fusion;  Surgeon: Cheryle Debby LABOR, MD;  Location: [facility];  Service: Neurosurgery;  Laterality: N/A;  3C   UMBILICAL HERNIA REPAIR N/A 03/25/2014   Procedure: UMBILICAL HERNIA REPAIR;  Surgeon: Elsie GORMAN Holland, MD;  Location: [facility];  Service: General;  Laterality: N/A;   uterine ablation      OB History     Gravida  2   Para  2   Term  2   Preterm      AB      Living  2      SAB      IAB      Ectopic      Multiple      Live Births  2            Home Medications    Prior to Admission medications  Medication Sig Start Date End Date Taking? Authorizing Provider  fluticasone  (FLONASE ) 50 MCG/ACT nasal spray Place 2 sprays into both nostrils daily. 12/13/24  Yes Leath-Warren, Etta JINNY, NP  meclizine  (ANTIVERT ) 12.5 MG tablet Take 1 tablet (12.5 mg total) by mouth 3 (three) times daily as needed for dizziness. 12/13/24  Yes Leath-Warren, Etta JINNY, NP  ondansetron  (ZOFRAN -ODT) 4 MG disintegrating tablet Take 1 tablet (4 mg total) by mouth every 8 (eight) hours as needed. 12/13/24  Yes Leath-Warren, Etta JINNY, NP  oseltamivir  (TAMIFLU ) 75 MG capsule Take 1 capsule (75 mg total) by mouth every 12 (twelve) hours. 12/13/24  Yes Leath-Warren, Etta JINNY, NP  albuterol  (VENTOLIN  HFA) 108 (90 Base) MCG/ACT inhaler Inhale 2 puffs into the lungs every  6 (six) hours as needed for wheezing or shortness of breath. 02/28/24   Tobie Suzzane POUR, MD  blood glucose meter kit and supplies Use up to four times daily as directed. 08/31/21   Elnor Fairy HERO, NP  chlorhexidine  (HIBICLENS ) 4 % external liquid Apply topically daily as needed. 08/14/24   Stuart Vernell Norris, PA-C  citalopram  (CELEXA ) 10 MG tablet Take 1 tablet (10 mg total) by mouth daily. 06/26/24   Tobie Suzzane POUR, MD  clobetasol  ointment (TEMOVATE ) 0.05 % Apply 1 Application topically 2 (two) times daily. 08/14/24   Stuart Vernell Norris, PA-C  cyclobenzaprine  (FLEXERIL ) 10 MG tablet Take 1 tablet (10 mg total) by mouth 3 (three) times daily as needed for muscle spasms. 08/31/24   McKenzie, Belvie CROME, MD  dicyclomine  (BENTYL ) 10 MG capsule Take 1  capsule (10 mg total) by mouth 3 (three) times daily as needed for spasms. 10/08/24   Carlan, Chelsea L, NP  Empagliflozin -metFORMIN  HCl (SYNJARDY ) 12.5-500 MG TABS Take 1 tablet by mouth daily. 11/30/24   Tobie Suzzane POUR, MD  fluconazole  (DIFLUCAN ) 150 MG tablet Take 1 tablet (150 mg total) by mouth once a week. 09/24/24   Vivienne Delon HERO, PA-C  furosemide  (LASIX ) 20 MG tablet Take 20 mg by mouth 2 (two) times daily as needed for edema.    [provider]  gabapentin  (NEURONTIN ) 600 MG tablet Take 1 tablet (600 mg total) by mouth 3 (three) times daily. 10/14/24   Tobie Suzzane POUR, MD  glucose blood test strip Test up to 4 times a day 10/02/21   Tobie Suzzane POUR, MD  hydrOXYzine  (VISTARIL ) 25 MG capsule Take 1 capsule (25 mg total) by mouth every 8 (eight) hours as needed. 07/29/24   Vivienne Delon HERO, PA-C  ibuprofen  (ADVIL ) 800 MG tablet Take 1 tablet (800 mg total) by mouth every 8 (eight) hours as needed. 03/19/24   Bacchus, Gloria Z, FNP  Lancets (FREESTYLE) lancets Use to test 4 times daily 08/31/21   Elnor Fairy HERO, NP  lisinopril -hydrochlorothiazide  (ZESTORETIC ) 20-12.5 MG tablet Take 1 tablet by mouth daily. 01/24/24   Tobie Suzzane POUR, MD   lubiprostone  (AMITIZA ) 24 MCG capsule Take 1 capsule (24 mcg total) by mouth 2 (two) times daily with a meal. 03/17/24   Carlan, Chelsea L, NP  meloxicam  (MOBIC ) 7.5 MG tablet Take 1 tablet (7.5 mg total) by mouth daily. 08/25/24   Margrette Taft BRAVO, MD  methylPREDNISolone  (MEDROL  DOSEPAK) 4 MG TBPK tablet 6 day dose pack - take as directed 10/30/24   Petery, John, DPM  mupirocin  ointment (BACTROBAN ) 2 % Apply 1 Application topically 2 (two) times daily. 07/29/24   Vivienne Delon HERO, PA-C  omeprazole  (PRILOSEC) 40 MG capsule Take 1 capsule (40 mg total) by mouth daily. 10/14/24   Tobie Suzzane POUR, MD  ondansetron  (ZOFRAN ) 4 MG tablet Take 1 tablet (4 mg total) by mouth daily as needed for nausea or vomiting. 08/27/24   Tobie Suzzane POUR, MD  oxybutynin  (DITROPAN -XL) 5 MG 24 hr tablet Take 1 tablet (5 mg total) by mouth at bedtime. 06/29/24   Tobie Suzzane POUR, MD  oxyCODONE -acetaminophen  (PERCOCET) 5-325 MG tablet Take 1 tablet by mouth every 4 (four) hours as needed. 08/31/24   McKenzie, Belvie CROME, MD  Plecanatide  (TRULANCE ) 3 MG TABS Take 1 tablet (3 mg total) by mouth daily. 10/08/24   Carlan, Chelsea L, NP  Potassium Citrate  (UROCIT-K  15) 15 MEQ (1620 MG) TBCR Take 1 tablet by mouth 2 (two) times daily. 05/15/24   McKenzie, Belvie CROME, MD  promethazine  (PHENERGAN ) 12.5 MG tablet Take 1 tablet (12.5 mg total) by mouth every 6 (six) hours as needed for nausea or vomiting. 08/31/24   McKenzie, Belvie CROME, MD  promethazine  (PHENERGAN ) 25 MG tablet TAKE 1 TABLET(25 MG) BY MOUTH EVERY 8 HOURS AS NEEDED FOR NAUSEA OR VOMITING 09/04/22   Melvenia Manus BRAVO, MD  simvastatin  (ZOCOR ) 40 MG tablet Take 1 tablet (40 mg total) by mouth every evening for cholesterol 10/30/24   Tobie Suzzane POUR, MD  tirzepatide  (MOUNJARO ) 5 MG/0.5ML Pen Inject 5 mg into the skin once a week. 11/10/24   Nida, Gebreselassie W, MD  triamcinolone  ointment (KENALOG ) 0.1 % Apply 1 gram twice daily to affected areas of skin. Stop once resolved and  restart as needed for flares. Avoid use on  face, armpits, groin unless otherwise indicated. 10/20/24   Raymund Lauraine BROCKS, MD  zolpidem  (AMBIEN ) 5 MG tablet Take 1 tablet (5 mg total) by mouth at bedtime as needed for sleep. 10/14/24   Tobie Suzzane POUR, MD    Family History Family History  Problem Relation Age of Onset   Breast cancer Mother    Hypertension Mother    Cancer Mother    Diabetes Mother    Stroke Mother    Alzheimer's disease Mother    Diabetes Father    Breast cancer Sister    Cancer Paternal Uncle    Breast cancer Maternal Grandmother    Breast cancer Paternal Grandmother    Depression Paternal Grandmother    Depression Paternal Grandfather    Heart disease Other    Cancer Other    Diabetes Other    Achalasia Other    Anesthesia problems Neg Hx    Hypotension Neg Hx    Malignant hyperthermia Neg Hx    Pseudochol deficiency Neg Hx    Colon cancer Neg Hx    Colon polyps Neg Hx     Social History Social History[1]   Allergies   Ciprofloxacin , Penicillins, Codeine, Morphine , Sulfonamide derivatives, and Vancomycin    Review of Systems Review of Systems Per HPI  Physical Exam Triage Vital Signs ED Triage Vitals [12/13/24 0905]  Encounter Vitals Group     BP 112/71     Girls Systolic BP Percentile      Girls Diastolic BP Percentile      Boys Systolic BP Percentile      Boys Diastolic BP Percentile      Pulse Rate 73     Resp 16     Temp 98.3 F (36.8 C)     Temp Source Oral     SpO2 97 %     Weight      Height      Head Circumference      Peak Flow      Pain Score      Pain Loc      Pain Education      Exclude from Growth Chart    Orthostatic VS for the past 24 hrs:  BP- Lying Pulse- Lying BP- Sitting Pulse- Sitting BP- Standing at 0 minutes Pulse- Standing at 0 minutes  12/13/24 0939 112/74 69 123/72 70 107/70 80    Updated Vital Signs BP 112/71 (BP Location: Right Arm)   Pulse 73   Temp 98.3 F (36.8 C) (Oral)   Resp 16   LMP  07/16/2012   SpO2 97%   Visual Acuity Right Eye Distance:   Left Eye Distance:   Bilateral Distance:    Right Eye Near:   Left Eye Near:    Bilateral Near:     Physical Exam Vitals and nursing note reviewed.  Constitutional:      General: She is not in acute distress.    Appearance: Normal appearance.  HENT:     Head: Normocephalic.     Right Ear: Tympanic membrane, ear canal and external ear normal.     Left Ear: Tympanic membrane, ear canal and external ear normal.     Nose: Congestion present.     Mouth/Throat:     Lips: Pink.     Mouth: Mucous membranes are moist.     Pharynx: Uvula midline. Postnasal drip present. No pharyngeal swelling, oropharyngeal exudate, posterior oropharyngeal erythema or uvula swelling.     Comments: Cobblestoning present to posterior oropharynx  Eyes:     Extraocular Movements: Extraocular movements intact.     Conjunctiva/sclera: Conjunctivae normal.     Pupils: Pupils are equal, round, and reactive to light.  Cardiovascular:     Rate and Rhythm: Normal rate and regular rhythm.     Pulses: Normal pulses.     Heart sounds: Normal heart sounds.  Pulmonary:     Effort: Pulmonary effort is normal. No respiratory distress.     Breath sounds: Normal breath sounds. No stridor. No wheezing, rhonchi or rales.  Abdominal:     General: Bowel sounds are normal.     Palpations: Abdomen is soft.  Musculoskeletal:     Cervical back: Normal range of motion.  Skin:    General: Skin is warm and dry.  Neurological:     General: No focal deficit present.     Mental Status: She is alert and oriented to person, place, and time.     GCS: GCS eye subscore is 4. GCS verbal subscore is 5. GCS motor subscore is 6.     Cranial Nerves: Cranial nerves 2-12 are intact.     Motor: Motor function is intact.     Coordination: Coordination is intact.     Gait: Gait is intact.  Psychiatric:        Mood and Affect: Mood normal.        Behavior: Behavior normal.       UC Treatments / Results  Labs (all labs ordered are listed, but only abnormal results are displayed) Labs Reviewed  POCT INFLUENZA A/B - Normal    EKG   Radiology No results found.  Procedures Procedures (including critical care time)  Medications Ordered in UC Medications - No data to display  Initial Impression / Assessment and Plan / UC Course  I have reviewed the triage vital signs and the nursing notes.  Pertinent labs & imaging results that were available during my care of the patient were reviewed by me and considered in my medical decision making (see chart for details).  Influenza test was negative.  On exam, the patient's lung sounds are clear throughout, room air sats are at 97%.  She is well-appearing, is in no acute distress, and vital signs are stable.  Orthostatic vital signs were negative.  Symptoms most likely of viral etiology.  Will provide symptomatic treatment with fluticasone  50 mcg nasal spray, ondansetron  4 mg ODT for nausea and meclizine  12.5 mg for dizziness and Tamiflu  75 mg for flulike symptoms..  Supportive care recommendations were provided and discussed with the patient to include fluids, rest, over-the-counter analgesics, use of normal saline nasal spray.  Discussed indications with patient regarding follow-up.  Patient was in agreement with this plan of care and verbalizes understanding.  All questions were answered.  Patient stable for discharge.  Work note was provided.  Final Clinical Impressions(s) / UC Diagnoses   Final diagnoses:  Viral illness  Flu-like symptoms     Discharge Instructions      The influenza test was negative.  I have decided to treat you with Tamiflu  given your flulike symptoms. Take medication as prescribed. Increase fluids and allow for plenty of rest. Recommend normal saline nasal spray throughout the day for nasal congestion or runny nose. Recommend a BRAT diet until nausea improves.  This includes  bananas, rice, applesauce, or toast. Symptoms should begin to improve over the next 5 to 7 days.  If symptoms fail to improve, or begin to worsen, you may follow-up in  this clinic or with your primary care physician for further evaluation. Follow-up as needed.     ED Prescriptions     Medication Sig Dispense Auth. Provider   ondansetron  (ZOFRAN -ODT) 4 MG disintegrating tablet Take 1 tablet (4 mg total) by mouth every 8 (eight) hours as needed. 20 tablet Leath-Warren, Etta PARAS, NP   fluticasone  (FLONASE ) 50 MCG/ACT nasal spray Place 2 sprays into both nostrils daily. 16 g Leath-Warren, Etta PARAS, NP   meclizine  (ANTIVERT ) 12.5 MG tablet Take 1 tablet (12.5 mg total) by mouth 3 (three) times daily as needed for dizziness. 30 tablet Leath-Warren, Etta PARAS, NP   oseltamivir  (TAMIFLU ) 75 MG capsule Take 1 capsule (75 mg total) by mouth every 12 (twelve) hours. 10 capsule Leath-Warren, Etta PARAS, NP      PDMP not reviewed this encounter.     [1]  Social History Tobacco Use   Smoking status: Never   Smokeless tobacco: Never  Vaping Use   Vaping status: Never Used  Substance Use Topics   Alcohol use: No   Drug use: No     Gilmer Etta PARAS, NP 12/13/24 [phone]  "

## 2024-12-13 NOTE — Discharge Instructions (Signed)
 The influenza test was negative.  I have decided to treat you with Tamiflu  given your flulike symptoms. Take medication as prescribed. Increase fluids and allow for plenty of rest. Recommend normal saline nasal spray throughout the day for nasal congestion or runny nose. Recommend a BRAT diet until nausea improves.  This includes bananas, rice, applesauce, or toast. Symptoms should begin to improve over the next 5 to 7 days.  If symptoms fail to improve, or begin to worsen, you may follow-up in this clinic or with your primary care physician for further evaluation. Follow-up as needed.

## 2024-12-13 NOTE — ED Triage Notes (Signed)
 Pt states congestion,headache,nausea,chills since yesterday. States she had a negative covid test.  States she was sent in by her workplace for a flu test.

## 2024-12-15 ENCOUNTER — Other Ambulatory Visit (HOSPITAL_BASED_OUTPATIENT_CLINIC_OR_DEPARTMENT_OTHER): Payer: Self-pay

## 2024-12-15 ENCOUNTER — Ambulatory Visit

## 2024-12-16 ENCOUNTER — Emergency Department (HOSPITAL_COMMUNITY)

## 2024-12-16 ENCOUNTER — Encounter (HOSPITAL_COMMUNITY): Payer: Self-pay | Admitting: *Deleted

## 2024-12-16 ENCOUNTER — Other Ambulatory Visit: Payer: Self-pay

## 2024-12-16 ENCOUNTER — Emergency Department (HOSPITAL_COMMUNITY)
Admission: EM | Admit: 2024-12-16 | Discharge: 2024-12-16 | Disposition: A | Discharge status: Home / Self Care | Attending: Emergency Medicine | Admitting: Emergency Medicine

## 2024-12-16 DIAGNOSIS — R0789 Other chest pain: Secondary | ICD-10-CM | POA: Diagnosis present

## 2024-12-16 DIAGNOSIS — E119 Type 2 diabetes mellitus without complications: Secondary | ICD-10-CM | POA: Insufficient documentation

## 2024-12-16 DIAGNOSIS — R0602 Shortness of breath: Secondary | ICD-10-CM | POA: Insufficient documentation

## 2024-12-16 DIAGNOSIS — Z79899 Other long term (current) drug therapy: Secondary | ICD-10-CM | POA: Insufficient documentation

## 2024-12-16 DIAGNOSIS — R110 Nausea: Secondary | ICD-10-CM | POA: Diagnosis not present

## 2024-12-16 LAB — CBC WITH DIFFERENTIAL/PLATELET
Abs Immature Granulocytes: 0 K/uL (ref 0.00–0.07)
Basophils Absolute: 0.1 K/uL (ref 0.0–0.1)
Basophils Relative: 1 %
Eosinophils Absolute: 0.1 K/uL (ref 0.0–0.5)
Eosinophils Relative: 2 %
HCT: 39.9 % (ref 36.0–46.0)
Hemoglobin: 13.1 g/dL (ref 12.0–15.0)
Immature Granulocytes: 0 %
Lymphocytes Relative: 38 %
Lymphs Abs: 2.3 K/uL (ref 0.7–4.0)
MCH: 28.6 pg (ref 26.0–34.0)
MCHC: 32.8 g/dL (ref 30.0–36.0)
MCV: 87.1 fL (ref 80.0–100.0)
Monocytes Absolute: 0.5 K/uL (ref 0.1–1.0)
Monocytes Relative: 9 %
Neutro Abs: 3.1 K/uL (ref 1.7–7.7)
Neutrophils Relative %: 50 %
Platelets: 249 K/uL (ref 150–400)
RBC: 4.58 MIL/uL (ref 3.87–5.11)
RDW: 12.8 % (ref 11.5–15.5)
WBC: 6 K/uL (ref 4.0–10.5)
nRBC: 0 % (ref 0.0–0.2)

## 2024-12-16 LAB — TROPONIN T, HIGH SENSITIVITY
Troponin T High Sensitivity: <15 ng/L (ref 0–19)
Troponin T High Sensitivity: <15 ng/L (ref 0–19)

## 2024-12-16 LAB — BASIC METABOLIC PANEL WITH GFR
Anion gap: 13 (ref 5–15)
BUN: 13 mg/dL (ref 6–20)
CO2: 24 mmol/L (ref 22–32)
Calcium: 9 mg/dL (ref 8.9–10.3)
Chloride: 102 mmol/L (ref 98–111)
Creatinine, Ser: 0.64 mg/dL (ref 0.44–1.00)
GFR, Estimated: >60 mL/min
Glucose, Bld: 93 mg/dL (ref 70–99)
Potassium: 3.7 mmol/L (ref 3.5–5.1)
Sodium: 139 mmol/L (ref 135–145)

## 2024-12-16 MED ORDER — KETOROLAC TROMETHAMINE 30 MG/ML IJ SOLN
30.0000 mg | Freq: Once | INTRAMUSCULAR | Status: AC
  Administered 2024-12-16: 30 mg via INTRAVENOUS
  Filled 2024-12-16: qty 1

## 2024-12-16 NOTE — ED Provider Notes (Signed)
 "   EMERGENCY DEPARTMENT AT [facility]  Provider Note  CSN: [id number] Arrival date & time: 12/16/24 9668  History Chief Complaint  Patient presents with   Chest Pain    [name] is a 59 y.o. female presents for evaluation of chest pain, woke her up from sleep just prior to arrival. She reports she's been feeling poorly the last few days with some flu-like symptoms and mild chest pains. She had negative Covid and flu testing. She reports her pain was more severe on waking and was on the R side of her chest, radiating into her arm. Some SOB and nausea. No prior cardiac issues. She is diabetic. She denies leg swelling or recent travel.    Home Medications Prior to Admission medications  Medication Sig Start Date End Date Taking? Authorizing Provider  albuterol  (VENTOLIN  HFA) 108 (90 Base) MCG/ACT inhaler Inhale 2 puffs into the lungs every 6 (six) hours as needed for wheezing or shortness of breath. 02/28/24   Tobie Suzzane POUR, MD  blood glucose meter kit and supplies Use up to four times daily as directed. 08/31/21   Elnor Fairy HERO, NP  chlorhexidine  (HIBICLENS ) 4 % external liquid Apply topically daily as needed. 08/14/24   Stuart Vernell Norris, PA-C  citalopram  (CELEXA ) 10 MG tablet Take 1 tablet (10 mg total) by mouth daily. 06/26/24   Tobie Suzzane POUR, MD  clobetasol  ointment (TEMOVATE ) 0.05 % Apply 1 Application topically 2 (two) times daily. 08/14/24   Stuart Vernell Norris, PA-C  cyclobenzaprine  (FLEXERIL ) 10 MG tablet Take 1 tablet (10 mg total) by mouth 3 (three) times daily as needed for muscle spasms. 08/31/24   McKenzie, Belvie CROME, MD  dicyclomine  (BENTYL ) 10 MG capsule Take 1 capsule (10 mg total) by mouth 3 (three) times daily as needed for spasms. 10/08/24   Carlan, Chelsea L, NP  Empagliflozin -metFORMIN  HCl (SYNJARDY ) 12.5-500 MG TABS Take 1 tablet by mouth daily. 11/30/24   Tobie Suzzane POUR, MD  fluconazole  (DIFLUCAN ) 150 MG tablet Take 1 tablet (150 mg total)  by mouth once a week. 09/24/24   Vivienne Delon HERO, PA-C  fluticasone  (FLONASE ) 50 MCG/ACT nasal spray Place 2 sprays into both nostrils daily. 12/13/24   Leath-Warren, Etta PARAS, NP  furosemide  (LASIX ) 20 MG tablet Take 20 mg by mouth 2 (two) times daily as needed for edema.    [provider]  gabapentin  (NEURONTIN ) 600 MG tablet Take 1 tablet (600 mg total) by mouth 3 (three) times daily. 10/14/24   Tobie Suzzane POUR, MD  glucose blood test strip Test up to 4 times a day 10/02/21   Tobie Suzzane POUR, MD  hydrOXYzine  (VISTARIL ) 25 MG capsule Take 1 capsule (25 mg total) by mouth every 8 (eight) hours as needed. 07/29/24   Vivienne Delon HERO, PA-C  ibuprofen  (ADVIL ) 800 MG tablet Take 1 tablet (800 mg total) by mouth every 8 (eight) hours as needed. 03/19/24   Bacchus, Gloria Z, FNP  Lancets (FREESTYLE) lancets Use to test 4 times daily 08/31/21   Elnor Fairy HERO, NP  lisinopril -hydrochlorothiazide  (ZESTORETIC ) 20-12.5 MG tablet Take 1 tablet by mouth daily. 01/24/24   Tobie Suzzane POUR, MD  lubiprostone  (AMITIZA ) 24 MCG capsule Take 1 capsule (24 mcg total) by mouth 2 (two) times daily with a meal. 03/17/24   Carlan, Chelsea L, NP  meclizine  (ANTIVERT ) 12.5 MG tablet Take 1 tablet (12.5 mg total) by mouth 3 (three) times daily as needed for dizziness. 12/13/24   Leath-Warren,  Etta PARAS, NP  meloxicam  (MOBIC ) 7.5 MG tablet Take 1 tablet (7.5 mg total) by mouth daily. 08/25/24   Margrette Taft BRAVO, MD  methylPREDNISolone  (MEDROL  DOSEPAK) 4 MG TBPK tablet 6 day dose pack - take as directed 10/30/24   Petery, John, DPM  mupirocin  ointment (BACTROBAN ) 2 % Apply 1 Application topically 2 (two) times daily. 07/29/24   Vivienne Delon HERO, PA-C  omeprazole  (PRILOSEC) 40 MG capsule Take 1 capsule (40 mg total) by mouth daily. 10/14/24   Tobie Suzzane POUR, MD  ondansetron  (ZOFRAN ) 4 MG tablet Take 1 tablet (4 mg total) by mouth daily as needed for nausea or vomiting. 08/27/24   Tobie Suzzane POUR, MD  ondansetron   (ZOFRAN -ODT) 4 MG disintegrating tablet Take 1 tablet (4 mg total) by mouth every 8 (eight) hours as needed. 12/13/24   Leath-Warren, Etta PARAS, NP  oseltamivir  (TAMIFLU ) 75 MG capsule Take 1 capsule (75 mg total) by mouth every 12 (twelve) hours. 12/13/24   Leath-Warren, Etta PARAS, NP  oxybutynin  (DITROPAN -XL) 5 MG 24 hr tablet Take 1 tablet (5 mg total) by mouth at bedtime. 06/29/24   Tobie Suzzane POUR, MD  oxyCODONE -acetaminophen  (PERCOCET) 5-325 MG tablet Take 1 tablet by mouth every 4 (four) hours as needed. 08/31/24   McKenzie, Belvie CROME, MD  Plecanatide  (TRULANCE ) 3 MG TABS Take 1 tablet (3 mg total) by mouth daily. 10/08/24   Carlan, Chelsea L, NP  Potassium Citrate  (UROCIT-K  15) 15 MEQ (1620 MG) TBCR Take 1 tablet by mouth 2 (two) times daily. 05/15/24   McKenzie, Belvie CROME, MD  promethazine  (PHENERGAN ) 12.5 MG tablet Take 1 tablet (12.5 mg total) by mouth every 6 (six) hours as needed for nausea or vomiting. 08/31/24   McKenzie, Belvie CROME, MD  promethazine  (PHENERGAN ) 25 MG tablet TAKE 1 TABLET(25 MG) BY MOUTH EVERY 8 HOURS AS NEEDED FOR NAUSEA OR VOMITING 09/04/22   Melvenia Manus BRAVO, MD  simvastatin  (ZOCOR ) 40 MG tablet Take 1 tablet (40 mg total) by mouth every evening for cholesterol 10/30/24   Tobie Suzzane POUR, MD  tirzepatide  (MOUNJARO ) 5 MG/0.5ML Pen Inject 5 mg into the skin once a week. 11/10/24   Nida, Gebreselassie W, MD  triamcinolone  ointment (KENALOG ) 0.1 % Apply 1 gram twice daily to affected areas of skin. Stop once resolved and restart as needed for flares. Avoid use on face, armpits, groin unless otherwise indicated. 10/20/24   Raymund Lauraine BROCKS, MD  zolpidem  (AMBIEN ) 5 MG tablet Take 1 tablet (5 mg total) by mouth at bedtime as needed for sleep. 10/14/24   Tobie Suzzane POUR, MD     Allergies    Ciprofloxacin , Penicillins, Codeine, Morphine , Sulfonamide derivatives, and Vancomycin    Review of Systems   Review of Systems Please see HPI for pertinent positives and negatives  Physical  Exam BP 130/70   Pulse 66   Temp 98.3 F (36.8 C) (Oral)   Resp 12   Ht 4' 8 (1.422 m)   Wt 79.4 kg   LMP 07/16/2012   SpO2 96%   BMI 39.23 kg/m   Physical Exam Vitals and nursing note reviewed.  Constitutional:      Appearance: Normal appearance.  HENT:     Head: Normocephalic and atraumatic.     Nose: Nose normal.     Mouth/Throat:     Mouth: Mucous membranes are moist.  Eyes:     Extraocular Movements: Extraocular movements intact.     Conjunctiva/sclera: Conjunctivae normal.  Cardiovascular:     Rate and  Rhythm: Normal rate.  Pulmonary:     Effort: Pulmonary effort is normal.     Breath sounds: Normal breath sounds.  Chest:     Chest wall: Tenderness present.  Abdominal:     General: Abdomen is flat.     Palpations: Abdomen is soft.     Tenderness: There is no abdominal tenderness.  Musculoskeletal:        General: No swelling. Normal range of motion.     Cervical back: Neck supple.  Skin:    General: Skin is warm and dry.  Neurological:     General: No focal deficit present.     Mental Status: She is alert.  Psychiatric:        Mood and Affect: Mood normal.     ED Results / Procedures / Treatments   EKG EKG Interpretation Date/Time:  Wednesday December 16 2024 03:44:51 EST Ventricular Rate:  68 PR Interval:  173 QRS Duration:  76 QT Interval:  389 QTC Calculation: 414 R Axis:   68  Text Interpretation: Sinus rhythm Low voltage, precordial leads No significant change since last tracing Confirmed by Roselyn Dunnings [phone]) on 12/16/2024 3:54:44 AM  Procedures Procedures  Medications Ordered in the ED Medications  ketorolac  (TORADOL ) 30 MG/ML injection 30 mg (30 mg Intravenous Given 12/16/24 0453)    Initial Impression and Plan  Patient here with atypical chest pains, reproducible on exam. Low risk factor for ACS, PERC neg; will check labs, CXR, EKG and give some Toradol  for her pain.   ED Course   Clinical Course as of 12/16/24 0704  Wed Dec 16, 2024  0451 I personally viewed the images from radiology studies and agree with radiologist interpretation: CXR is clear [CS]  0527 CBC, BMP and initial Trop are neg.  [CS]  0700 Delta Trop remains negative. Patient resting comfortably in no distress. Low risk for ACS. HEART score is 2. Recommend NSAIDs, rest and PCP follow up, RTED for any other concerns.   [CS]    Clinical Course User Index [CS] Roselyn Dunnings NOVAK, MD     MDM Rules/Calculators/A&P Medical Decision Making Problems Addressed: Atypical chest pain: acute illness or injury  Amount and/or Complexity of Data Reviewed Labs: ordered. Decision-making details documented in ED Course. Radiology: ordered and independent interpretation performed. Decision-making details documented in ED Course. ECG/medicine tests: ordered and independent interpretation performed. Decision-making details documented in ED Course.  Risk Prescription drug management.     Final Clinical Impression(s) / ED Diagnoses Final diagnoses:  Atypical chest pain    Rx / DC Orders ED Discharge Orders     None        Roselyn Dunnings NOVAK, MD 12/16/24 [phone]  "

## 2024-12-16 NOTE — ED Triage Notes (Signed)
 Pt c/o chest pain that radiates down right arm; the pain woke her from sleep; pt c/o sob and nausea

## 2024-12-17 ENCOUNTER — Other Ambulatory Visit (HOSPITAL_BASED_OUTPATIENT_CLINIC_OR_DEPARTMENT_OTHER): Payer: Self-pay

## 2024-12-24 ENCOUNTER — Ambulatory Visit: Admitting: Podiatry

## 2024-12-25 ENCOUNTER — Other Ambulatory Visit: Payer: Self-pay | Admitting: Internal Medicine

## 2024-12-25 ENCOUNTER — Other Ambulatory Visit (HOSPITAL_BASED_OUTPATIENT_CLINIC_OR_DEPARTMENT_OTHER): Payer: Self-pay

## 2024-12-25 ENCOUNTER — Other Ambulatory Visit (HOSPITAL_COMMUNITY): Payer: Self-pay

## 2024-12-25 ENCOUNTER — Other Ambulatory Visit: Payer: Self-pay

## 2024-12-25 DIAGNOSIS — R3915 Urgency of urination: Secondary | ICD-10-CM

## 2024-12-25 DIAGNOSIS — F3341 Major depressive disorder, recurrent, in partial remission: Secondary | ICD-10-CM

## 2024-12-25 DIAGNOSIS — R42 Dizziness and giddiness: Secondary | ICD-10-CM

## 2024-12-25 DIAGNOSIS — G5601 Carpal tunnel syndrome, right upper limb: Secondary | ICD-10-CM

## 2024-12-25 DIAGNOSIS — R200 Anesthesia of skin: Secondary | ICD-10-CM

## 2024-12-25 MED ORDER — OXYBUTYNIN CHLORIDE ER 5 MG PO TB24
5.0000 mg | ORAL_TABLET | Freq: Every day | ORAL | 5 refills | Status: AC
  Filled 2024-12-25 (×2): qty 30, 30d supply, fill #0

## 2024-12-28 ENCOUNTER — Other Ambulatory Visit (HOSPITAL_BASED_OUTPATIENT_CLINIC_OR_DEPARTMENT_OTHER): Payer: Self-pay

## 2024-12-28 ENCOUNTER — Other Ambulatory Visit (HOSPITAL_COMMUNITY): Payer: Self-pay

## 2024-12-28 MED ORDER — MECLIZINE HCL 25 MG PO TABS
25.0000 mg | ORAL_TABLET | Freq: Two times a day (BID) | ORAL | 0 refills | Status: AC | PRN
  Filled 2024-12-28 (×2): qty 30, 15d supply, fill #0

## 2024-12-28 MED ORDER — CITALOPRAM HYDROBROMIDE 10 MG PO TABS
10.0000 mg | ORAL_TABLET | Freq: Every day | ORAL | 5 refills | Status: AC
  Filled 2024-12-28: qty 30, 30d supply, fill #0

## 2024-12-28 MED ORDER — MELOXICAM 7.5 MG PO TABS
7.5000 mg | ORAL_TABLET | Freq: Every day | ORAL | 5 refills | Status: AC
  Filled 2024-12-28: qty 30, 30d supply, fill #0

## 2024-12-29 ENCOUNTER — Other Ambulatory Visit: Payer: Self-pay

## 2025-01-01 ENCOUNTER — Ambulatory Visit: Admitting: Internal Medicine

## 2025-01-06 ENCOUNTER — Ambulatory Visit: Admitting: Podiatry

## 2025-01-11 ENCOUNTER — Other Ambulatory Visit (HOSPITAL_BASED_OUTPATIENT_CLINIC_OR_DEPARTMENT_OTHER): Payer: Self-pay

## 2025-01-11 ENCOUNTER — Encounter (HOSPITAL_BASED_OUTPATIENT_CLINIC_OR_DEPARTMENT_OTHER): Payer: Self-pay

## 2025-01-13 ENCOUNTER — Ambulatory Visit: Admitting: Podiatry

## 2025-01-18 ENCOUNTER — Ambulatory Visit: Admitting: Urology

## 2025-01-19 ENCOUNTER — Ambulatory Visit

## 2025-02-02 ENCOUNTER — Ambulatory Visit (INDEPENDENT_AMBULATORY_CARE_PROVIDER_SITE_OTHER): Admitting: Gastroenterology

## 2025-02-10 ENCOUNTER — Ambulatory Visit: Admitting: Adult Health

## 2025-02-17 ENCOUNTER — Ambulatory Visit: Admitting: Internal Medicine

## 2025-03-16 ENCOUNTER — Ambulatory Visit: Admitting: "Endocrinology

## 2025-05-14 ENCOUNTER — Ambulatory Visit: Admitting: Urology
